# Patient Record
Sex: Female | Born: 1940
Health system: Southern US, Community
[De-identification: ages and names within clinical notes are randomized; demographics above are authoritative.]

## PROBLEM LIST (undated history)

## (undated) ENCOUNTER — Emergency Department (HOSPITAL_COMMUNITY): Admission: EM | Payer: Medicare Other | Source: Home / Self Care

## (undated) DIAGNOSIS — E785 Hyperlipidemia, unspecified: Secondary | ICD-10-CM

## (undated) DIAGNOSIS — E039 Hypothyroidism, unspecified: Secondary | ICD-10-CM

## (undated) DIAGNOSIS — I480 Paroxysmal atrial fibrillation: Secondary | ICD-10-CM

## (undated) DIAGNOSIS — T4145XA Adverse effect of unspecified anesthetic, initial encounter: Secondary | ICD-10-CM

## (undated) DIAGNOSIS — N951 Menopausal and female climacteric states: Secondary | ICD-10-CM

## (undated) DIAGNOSIS — C569 Malignant neoplasm of unspecified ovary: Secondary | ICD-10-CM

## (undated) DIAGNOSIS — I639 Cerebral infarction, unspecified: Secondary | ICD-10-CM

## (undated) DIAGNOSIS — Z85828 Personal history of other malignant neoplasm of skin: Secondary | ICD-10-CM

## (undated) DIAGNOSIS — M1990 Unspecified osteoarthritis, unspecified site: Secondary | ICD-10-CM

## (undated) DIAGNOSIS — T8859XA Other complications of anesthesia, initial encounter: Secondary | ICD-10-CM

## (undated) DIAGNOSIS — I214 Non-ST elevation (NSTEMI) myocardial infarction: Secondary | ICD-10-CM

## (undated) DIAGNOSIS — I10 Essential (primary) hypertension: Secondary | ICD-10-CM

## (undated) HISTORY — PX: TRANSTHORACIC ECHOCARDIOGRAM: SHX275

## (undated) HISTORY — PX: JOINT REPLACEMENT: SHX530

## (undated) HISTORY — DX: Menopausal and female climacteric states: N95.1

## (undated) HISTORY — DX: Malignant neoplasm of unspecified ovary: C56.9

## (undated) HISTORY — DX: Paroxysmal atrial fibrillation: I48.0

---
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
## 1967-11-30 HISTORY — PX: OTHER SURGICAL HISTORY: SHX169

## 1967-11-30 HISTORY — PX: DILATION AND CURETTAGE OF UTERUS: SHX78

## 1984-11-29 HISTORY — PX: OTHER SURGICAL HISTORY: SHX169

## 2005-08-10 ENCOUNTER — Other Ambulatory Visit: Admission: RE | Admit: 2005-08-10 | Discharge: 2005-08-10 | Payer: Self-pay | Admitting: Obstetrics and Gynecology

## 2005-11-29 HISTORY — PX: OTHER SURGICAL HISTORY: SHX169

## 2006-02-22 ENCOUNTER — Ambulatory Visit (HOSPITAL_BASED_OUTPATIENT_CLINIC_OR_DEPARTMENT_OTHER): Admission: RE | Admit: 2006-02-22 | Discharge: 2006-02-22 | Payer: Self-pay | Admitting: Orthopedic Surgery

## 2006-09-09 ENCOUNTER — Ambulatory Visit (HOSPITAL_BASED_OUTPATIENT_CLINIC_OR_DEPARTMENT_OTHER): Admission: RE | Admit: 2006-09-09 | Discharge: 2006-09-09 | Payer: Self-pay | Admitting: Orthopedic Surgery

## 2007-12-08 ENCOUNTER — Encounter: Admission: RE | Admit: 2007-12-08 | Discharge: 2007-12-08 | Payer: Self-pay | Admitting: Internal Medicine

## 2009-01-13 ENCOUNTER — Encounter: Admission: RE | Admit: 2009-01-13 | Discharge: 2009-01-29 | Payer: Self-pay | Admitting: *Deleted

## 2009-04-29 HISTORY — PX: NM MYOVIEW LTD: HXRAD82

## 2009-05-14 ENCOUNTER — Inpatient Hospital Stay (HOSPITAL_COMMUNITY): Admission: EM | Admit: 2009-05-14 | Discharge: 2009-05-16 | Payer: Self-pay | Admitting: Cardiology

## 2009-05-14 ENCOUNTER — Encounter: Payer: Self-pay | Admitting: Emergency Medicine

## 2009-05-15 ENCOUNTER — Encounter (INDEPENDENT_AMBULATORY_CARE_PROVIDER_SITE_OTHER): Payer: Self-pay | Admitting: Cardiology

## 2009-11-29 HISTORY — PX: OTHER SURGICAL HISTORY: SHX169

## 2011-02-08 ENCOUNTER — Other Ambulatory Visit: Payer: Self-pay | Admitting: Otolaryngology

## 2011-02-08 DIAGNOSIS — H93A9 Pulsatile tinnitus, unspecified ear: Secondary | ICD-10-CM

## 2011-02-10 ENCOUNTER — Ambulatory Visit
Admission: RE | Admit: 2011-02-10 | Discharge: 2011-02-10 | Disposition: A | Discharge status: Home / Self Care | Payer: Medicare Other | Source: Ambulatory Visit | Attending: Otolaryngology | Admitting: Otolaryngology

## 2011-02-10 DIAGNOSIS — H93A9 Pulsatile tinnitus, unspecified ear: Secondary | ICD-10-CM

## 2011-02-10 MED ORDER — GADOBENATE DIMEGLUMINE 529 MG/ML IV SOLN
12.0000 mL | Freq: Once | INTRAVENOUS | Status: AC | PRN
  Administered 2011-02-10: 12 mL via INTRAVENOUS

## 2011-03-08 LAB — COMPREHENSIVE METABOLIC PANEL
Alkaline Phosphatase: 32 U/L — ABNORMAL LOW (ref 39–117)
BUN: 4 mg/dL — ABNORMAL LOW (ref 6–23)
Calcium: 7.7 mg/dL — ABNORMAL LOW (ref 8.4–10.5)
Creatinine, Ser: 0.54 mg/dL (ref 0.4–1.2)
Glucose, Bld: 108 mg/dL — ABNORMAL HIGH (ref 70–99)
Potassium: 3.3 mEq/L — ABNORMAL LOW (ref 3.5–5.1)
Total Protein: 5.9 g/dL — ABNORMAL LOW (ref 6.0–8.3)

## 2011-03-08 LAB — BRAIN NATRIURETIC PEPTIDE: Pro B Natriuretic peptide (BNP): 113 pg/mL — ABNORMAL HIGH (ref 0.0–100.0)

## 2011-03-08 LAB — BASIC METABOLIC PANEL
BUN: 4 mg/dL — ABNORMAL LOW (ref 6–23)
BUN: 5 mg/dL — ABNORMAL LOW (ref 6–23)
BUN: 12 mg/dL (ref 6–23)
CO2: 24 mEq/L (ref 19–32)
Calcium: 8.2 mg/dL — ABNORMAL LOW (ref 8.4–10.5)
Calcium: 8.9 mg/dL (ref 8.4–10.5)
Chloride: 101 mEq/L (ref 96–112)
Creatinine, Ser: 0.54 mg/dL (ref 0.4–1.2)
GFR calc non Af Amer: >60 mL/min (ref >60)
GFR calc non Af Amer: >60 mL/min (ref >60)
GFR calc non Af Amer: >60 mL/min (ref >60)
Glucose, Bld: 118 mg/dL — ABNORMAL HIGH (ref 70–99)
Glucose, Bld: 85 mg/dL (ref 70–99)
Glucose, Bld: 118 mg/dL — ABNORMAL HIGH (ref 70–99)
Potassium: 4.3 mEq/L (ref 3.5–5.1)
Sodium: 134 mEq/L — ABNORMAL LOW (ref 135–145)
Sodium: 137 mEq/L (ref 135–145)

## 2011-03-08 LAB — CK TOTAL AND CKMB (NOT AT ARMC)
CK, MB: 1 ng/mL (ref 0.3–4.0)
CK, MB: 1.4 ng/mL (ref 0.3–4.0)
Relative Index: INVALID (ref 0.0–2.5)
Total CK: 27 U/L (ref 7–177)
Total CK: 43 U/L (ref 7–177)

## 2011-03-08 LAB — PROTIME-INR
INR: 1.4 (ref 0.00–1.49)
Prothrombin Time: 17.2 seconds — ABNORMAL HIGH (ref 11.6–15.2)
Prothrombin Time: 15.3 seconds — ABNORMAL HIGH (ref 11.6–15.2)

## 2011-03-08 LAB — DIFFERENTIAL
Basophils Absolute: 0 10*3/uL (ref 0.0–0.1)
Eosinophils Absolute: 0 10*3/uL (ref 0.0–0.7)
Eosinophils Absolute: 0 10*3/uL (ref 0.0–0.7)
Eosinophils Relative: 0 % (ref 0–5)
Lymphs Abs: 0.9 10*3/uL (ref 0.7–4.0)
Lymphs Abs: 1.1 10*3/uL (ref 0.7–4.0)
Neutrophils Relative %: 80 % — ABNORMAL HIGH (ref 43–77)

## 2011-03-08 LAB — CBC
HCT: 26.1 % — ABNORMAL LOW (ref 36.0–46.0)
HCT: 29.6 % — ABNORMAL LOW (ref 36.0–46.0)
Hemoglobin: 10.2 g/dL — ABNORMAL LOW (ref 12.0–15.0)
MCHC: 34.5 g/dL (ref 30.0–36.0)
MCHC: 34.9 g/dL (ref 30.0–36.0)
MCV: 93.1 fL (ref 78.0–100.0)
MCV: 93.8 fL (ref 78.0–100.0)
Platelets: 152 10*3/uL (ref 150–400)
Platelets: 213 10*3/uL (ref 150–400)
Platelets: 219 10*3/uL (ref 150–400)
RBC: 3.16 MIL/uL — ABNORMAL LOW (ref 3.87–5.11)
RDW: 12.5 % (ref 11.5–15.5)
RDW: 12.8 % (ref 11.5–15.5)
RDW: 12.8 % (ref 11.5–15.5)

## 2011-03-08 LAB — IRON AND TIBC
Saturation Ratios: 20 % (ref 20–55)
TIBC: 196 ug/dL — ABNORMAL LOW (ref 250–470)

## 2011-03-08 LAB — T4, FREE
Free T4: 1.3 ng/dL (ref 0.80–1.80)
Free T4: 1.18 ng/dL (ref 0.80–1.80)

## 2011-03-08 LAB — RETICULOCYTES
RBC.: 3.38 MIL/uL — ABNORMAL LOW (ref 3.87–5.11)
Retic Count, Absolute: 50.7 10*3/uL (ref 19.0–186.0)

## 2011-03-08 LAB — CARDIAC PANEL(CRET KIN+CKTOT+MB+TROPI): CK, MB: 0.9 ng/mL (ref 0.3–4.0)

## 2011-03-08 LAB — LIPID PANEL
Triglycerides: 72 mg/dL (ref <150)
VLDL: 14 mg/dL (ref 0–40)

## 2011-03-08 LAB — MAGNESIUM: Magnesium: 2.1 mg/dL (ref 1.5–2.5)

## 2011-03-16 ENCOUNTER — Other Ambulatory Visit: Payer: Self-pay | Admitting: Orthopaedic Surgery

## 2011-03-16 DIAGNOSIS — M25551 Pain in right hip: Secondary | ICD-10-CM

## 2011-03-22 ENCOUNTER — Ambulatory Visit
Admission: RE | Admit: 2011-03-22 | Discharge: 2011-03-22 | Disposition: A | Discharge status: Home / Self Care | Payer: Medicare Other | Source: Ambulatory Visit | Attending: Orthopaedic Surgery | Admitting: Orthopaedic Surgery

## 2011-03-22 DIAGNOSIS — M25551 Pain in right hip: Secondary | ICD-10-CM

## 2011-04-13 NOTE — Discharge Summary (Signed)
NAMEBRIGETT, [name] NO.:  [id number]   MEDICAL RECORD NO.:  [id number]          PATIENT TYPE:  INP   LOCATION:  3738                         FACILITY:  [facility]   PHYSICIAN:  Sheliah Mends, MD      DATE OF BIRTH:  1941/01/29   DATE OF ADMISSION:  05/14/2009  DATE OF DISCHARGE:  05/16/2009                               DISCHARGE SUMMARY   DISCHARGE DIAGNOSES:  1. Paroxysmal atrial fibrillation, the patient is in sinus rhythm on      Multaq at discharge.  2. History of significant arthritis, followed by Dr. Timothy Lasso.  3. Treated hypertension.  4. Treated hypothyroidism.   HOSPITAL COURSE:  The patient is a 70 year old female, who is new to [place].  We are asked to see by the emergency room for atrial fibrillation.  She  presented to the emergency room with tachycardia.  Please see admission  history and physical for complete details.  This was apparently a new  onset with rapid ventricular response.  She has significant arthritis  and has heavy nonsteroidal anti-inflammatory use.  She was started on  heparin and amiodarone.  She actually converted spontaneously after an  amiodarone bolus to sinus.  Initially, she was put on Coumadin but after  discussion with Dr. Timothy Lasso and the patient and reviewing her CHADS 2  score, it was decided to send her home on aspirin alone.  We have also  changed her amiodarone to Multaq 400 mg b.i.d., the patient is very  concerned about the side effects of amiodarone.  At discharge,  echocardiogram showed normal LV function with a small pericardial  effusion and mild MR and TR.  We have no plans for Coumadin at this time  unless she has recurrent atrial fibrillation.   DISCHARGE MEDICATIONS:  1. Advil 800 mg b.i.d.  2. Aspirin 325 mg a day.  3. Bisoprolol 10 mg at bedtime.  4. Estrace 1 mg a day.  5. Glucosamine t.i.d.  6. Hydrochlorothiazide 12.5 mg a day.  7. Omeprazole 20 mg a day.  8. Synthroid 0.088 mg a day.  9. Progesterone 2.5  mg a day.  10.Multaq 400 mg b.i.d.   LABORATORY DATA:  Iron levels 39, TIBC 196, folate 1088, B12 of 959.  Free T4 is 1.3, TSH 5.07.  Cholesterol 112, HDL 14, LDL 184.  Troponins  were negative.  BNP was 113 on admission.  White count 5.3, hemoglobin  10.4, hematocrit 30.3, platelets 213.  INR is 1.4.  Sodium 137,  potassium 4.2, BUN 5, creatinine 0.6.  CT angiogram of the chest showed  bilateral pleural effusions with no pulmonary emboli.  Chest x-ray shows  cardiomegaly with mild vascular congestion on admission.  EKG on the  May 15, 2009, shows sinus rhythm, no acute changes, PACs, and a QTC of  478.   DISPOSITION:  The patient is discharged in stable condition and will  follow up with Dr. Garen Lah as an outpatient in a couple of weeks.      Abelino Derrick, P.A.      Sheliah Mends, MD  Electronically Signed    LKK/MEDQ  D:  05/16/2009  T:  05/17/2009  Job:  [id number]   cc:   Gwen Pounds, MD

## 2011-04-13 NOTE — H&P (Signed)
[name], [name] NO.:  [id number]   MEDICAL RECORD NO.:  [id number]          PATIENT TYPE:  EMS   LOCATION:  ED                           FACILITY:  [facility]   PHYSICIAN:  Devoria Albe, M.D.        DATE OF BIRTH:  [birth year]   DATE OF ADMISSION:  05/13/2009  DATE OF DISCHARGE:                              HISTORY & PHYSICAL   HISTORY OF PRESENT ILLNESS:  [name] is a 70 year old white  married female we are asked to see by a [facility] MD for atrial  flutter with rapid ventricular response.  The patient has no prior  history of coronary artery disease, PAF, CHF, diabetes, tobacco,  dyslipidemia, CVA, or TIA.  She does have a prior history of  hypertension and premature family history of coronary disease in her  maternal grandfather.  She states that she started taking Flexeril last  Wednesday, and she noticed a regular pounding heart beat intermittently.  Apparently last night, the fast irregular pounding would not stop.  She  did have some chest discomfort, however this was in her upper scapular  area, and she states it was mild.  There was no pain associated.  She  had no real shortness of breath, and she has had no symptoms of chest  pain prior.  She has been doing her normal activities without any  problems of dyspnea on exertion, chest pain, fatigue, etc.  In the  emergency room, she was given diltiazem 10 mg.  Blood pressure dropped  to 79/54, heart rate was 110.  She was given a fluid bolus.  Her heart  rate was controlled for while in the 80s to 90s, but it intermittently  goes up to 150s.   LABORATORY DATA:  Hemoglobin 12.1, hematocrit 35.1, WBC 7 and platelets  219.  Sodium 132, potassium 3.7, BUN 12, creatinine 0.72 and glucose  118, CK-MB and troponin were negative times one.  Chest x-ray showed  cardiomegaly with mild vascular congestion.   PRIOR MEDICAL HISTORY:  1. Hypertension for 10-12 years.  2. Hypothyroidism 7-8 years.  3.  Osteoarthritis all over.  4. She has a bone spur of left knee with a recent injection and she      was started on Flexeril.   SURGERIES:  1. Carpal tunnel.  2. Ruptured ovarian cyst repair.  3. Tonsillectomy.   MEDICATIONS:  1. Estrogen replacement therapy  2. High doses NSAIDS.  3. Flexeril 10 mg a day.  4. __________100 mg b.i.d.  5. Aspirin 81 mg every day.  6. Bisoprolol 10 mg at bedtime.  7. Estrace 1 mg every day.  8. Glucosamine three times a day.  9. Hydralazine 100 mg t.i.d.  10.Hydrochlorothiazide 25 mg every day.  11.__________2.5 mg a day.  12.Omeprazole 20 mg every day.  13.Synthroid 88 mcg a day.   ALLERGIES:  NO KNOWN DRUG ALLERGIES.  SHE IS INTOLERANT TO ACE  INHIBITORS.  SHE STATES HER HEAD FEELS HEAVY.   FAMILY HISTORY:  Her maternal grandfather had an MI and died at age 68.  His first MI was  in his 57s.  Mother is alive.  She has had a TIA.  Father died of an abdominal aortic aneurysm rupture.  She has one  brother and one sister in good health.  She is the oldest.   SOCIAL HISTORY:  She works as an Charity fundraiser at [employer], [employer]  She  is planning to return in August.  She walks 30 minutes most days.  She  has three children, four and a half grandchildren.  She is married.  No  tobacco or alcohol.   REVIEW OF SYSTEMS:  She has no presyncope, no syncope, no congestion, no  fever or chills.  __________GERD.  Positive tachy palpitations, plus or  minus some chest discomfort.  No lower extremity edema.  No dysuria.  No  black stools, no swelling.  Other symptoms are negative.   PHYSICAL EXAMINATION:  VITAL SIGNS:  Blood pressure is 114/67, heart  rate 118-154, 80-90s, respirations are 18, temperature is 98.7.  GENERAL:  No acute distress, laying on the stretcher with oxygen on.  SKIN:  Warm and dry.  HEENT:  She wears glasses.  Pupils equal, round, react to light.  NECK:  Supple.  No thyromegaly, no adenopathy and no carotid bruits,  respirations are  clear to auscultation bilaterally.  CARDIOVASCULAR:  Heart sounds irregular regular.  No murmur.  GI:  Abdomen soft, protuberant, bowel sounds present x4.  No mass.  MUSCULOSKELETAL:  Moves all extremities x4.  NEUROLOGICAL:  Alert and oriented x3.  No focal deficits.  LOWER EXTREMITIES:  Show no edema.   ASSESSMENT:  1. Look for proximal atrial flutter and fib with a rapid ventricular      response, maybe up to 5 days durations  2. Hypertension.  3. Hypothyroidism.  4. Mild vascular congestion on x-ray.  5. Osteoarthritis which is significant.  She is on high dose NSAIDS.      She states she does have a follow up to see rheumatologist.  6. History of replacement therapy.   PLAN:  We will admit her, anticoagulate, control rate, get 2-D echo.  She is at high risk for bleed on anticoagulation because of her NSAID.  She states she cannot do without them.  She will eventually need an  outpatient stress test.  Do know the patient needs a cath at this time.      Lezlie Octave, N.P.    ______________________________  Devoria Albe, M.D.    BB/MEDQ  D:  05/14/2009  T:  05/14/2009  Job:  [id number]   cc:   Gwen Pounds, MD  Vanita Panda. Magnus Ivan, M.D.

## 2011-04-16 NOTE — Op Note (Signed)
[name], BERMAN             ACCOUNT NO.:  [id number]   MEDICAL RECORD NO.:  [id number]          PATIENT TYPE:  AMB   LOCATION:  DSC                          FACILITY:  [facility]   PHYSICIAN:  Katy Fitch. Sypher, M.D. 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   OPERATION:  Release of right transverse carpal ligament.   OPERATIONS:  Katy Fitch. Sypher, M.D.   ASSISTANT:  Molly Maduro Dasnoit PA-C.   ANESTHESIA:  General by LMA.   SUPERVISING ANESTHESIOLOGIST:  Janetta Hora. Gelene Mink, M.D.   INDICATIONS:  [name] is a 70 year old right-hand dominant woman  referred by Dr. Creola Corn for evaluation and management of a painful and  numb right hand.  Clinical examination revealed signs of entrapped  neuropathy.  Electrodiagnostic studies confirmed significant right median  neuropathy.  After informed consent, she is brought to the operating room at  this time for release of her right transverse carpal ligament.   DESCRIPTION OF PROCEDURE:  [name] is brought to the operating room  and placed in supine position on the operating table.  Following the  induction of general anesthesia by LMA technique, the right arm was prepped  with Betadine soap solution and sterilely draped.  A pneumatic tourniquet  was applied to the proximal right brachium.  Following exsanguination of the  right arm with an Esmarch bandage, the arterial tourniquet was inflated to  220 mmHg.  The procedure commenced with a short incision in the line of the  ring finger in the palm.  The superficial palmar arch and the common sensory  branch of the median nerve were identified and followed back to the  transverse carpal ligament.  The ligament was  gently isolated from the  median nerve, followed by release of the ligament along its ulnar border  extending into the distal forearm.  This widely opened the carpal canal.  No  masses or other predicaments were noted.  Bleeding points along the margin  of the released ligament were electrocauterized with bipolar current  followed by repair of the skin with intradermal 3-0 Prolene suture.  A  compressive dressing was applied with a volar plaster splint maintaining the  wrist in 5 degrees of dorsiflexion.   For aftercare, [name] is provided with a prescription for Vicodin 5  mg 1 tablet p.o. q.4-6h. p.r.n. pain, 20 tablets without refill.      Katy Fitch Sypher, M.D.  Electronically Signed     RVS/MEDQ  D:  02/22/2006  T:  02/23/2006  Job:  [id number]   cc:   Gwen Pounds, MD  Fax: [phone]

## 2011-04-16 NOTE — Op Note (Signed)
[name], [name]             ACCOUNT NO.:  [id number]   MEDICAL RECORD NO.:  [id number]          PATIENT TYPE:  AMB   LOCATION:  DSC                          FACILITY:  [facility]   PHYSICIAN:  Katy Fitch. Sypher, M.D. DATE OF BIRTH:  [birth year]   DATE OF PROCEDURE:  09/09/2006  DATE OF DISCHARGE:                                 OPERATIVE REPORT   PREOPERATIVE DIAGNOSIS:  Chronic entrapment neuropathy, left median nerve  and carpal tunnel.   POSTOPERATIVE DIAGNOSIS:  Chronic entrapment neuropathy, left median nerve  and carpal tunnel.   OPERATION:  Release of the transverse carpal ligament.   OPERATIVE SURGEON:  Katy Fitch. Sypher, MD.   ASSISTANT:  Annye Rusk, PA-C.   ANESTHESIA:  General by LMA supervised by the anesthesiologist, Dr. Krista Blue.   INDICATIONS:  [name] is a 70 year old, right-hand-dominant woman,  who has had a history of bilateral carpal tunnel syndrome.   She is status post release of her right transverse carpal ligament in early  2007 with excellent results.  She now returns for left side surgery.  Preoperatively, she was reminded of potential risks and benefits of the  surgery.  She understands the aftercare from her prior experience.   After informed consent, [name] was brought to the operating room at  this time.   PROCEDURE:  [name] is brought to the operating room and placed in  supine position on the operating table.   Following the induction of general anesthesia by LMA technique, the left arm  was prepped with Betadine soap and solution and sterilely draped.  A  pneumatic tourniquet was applied to the proximal left brachium.   Following exsanguination of the left arm with an Esmarch bandage, the  tourniquet was inflated to 250 mmHg due to mild systolic hypertension.   The procedure commenced with a short incision in the line of the ring finger  in the palm.  Subcutaneous tissues were carefully divided taking care to  identify the palmar fascia.  The fascia was split longitudinally to reveal  the common sensory branches of the median nerve.   The common sensory branches were followed back to the median nerve proper.  A Penfield-4 elevator was used to clear a path superficial to the median  nerve and deep to the transverse carpal ligament.  The transverse carpal  ligament was then released subcutaneously with scissors extending into the  distal forearm.   This widened the open carpal canal.   No masses or __________were noted.   Bleeding points along the margin of the released ligament were  electrocauterized with bipolar current.   The wound was then repaired with intradermal #3-0 Prolene suture.   A compressive dressing was applied with a volar compressive splint to  maintain the wrist in five degrees of dorsiflexion.   [name] was awakened from her general anesthesia and transferred to  the recovery room with stable vital signs.   She will be discharged to the care of her husband with a prescription for  Percocet, which she already has at home.  She will also use over-the-counter  analgesics.  Katy Fitch Sypher, M.D.  Electronically Signed     RVS/MEDQ  D:  09/09/2006  T:  09/11/2006  Job:  [id number]   cc:   Gwen Pounds, MD

## 2012-02-16 ENCOUNTER — Encounter (HOSPITAL_COMMUNITY): Payer: Self-pay | Admitting: Pharmacy Technician

## 2012-02-17 ENCOUNTER — Other Ambulatory Visit (HOSPITAL_COMMUNITY): Payer: Self-pay | Admitting: Orthopaedic Surgery

## 2012-02-21 ENCOUNTER — Encounter (HOSPITAL_COMMUNITY)
Admission: RE | Admit: 2012-02-21 | Discharge: 2012-02-21 | Disposition: A | Discharge status: Home / Self Care | Payer: Medicare Other | Source: Ambulatory Visit | Attending: Orthopaedic Surgery | Admitting: Orthopaedic Surgery

## 2012-02-21 ENCOUNTER — Other Ambulatory Visit (HOSPITAL_COMMUNITY): Payer: Medicare Other

## 2012-02-21 ENCOUNTER — Ambulatory Visit (HOSPITAL_COMMUNITY)
Admission: RE | Admit: 2012-02-21 | Discharge: 2012-02-21 | Disposition: A | Discharge status: Home / Self Care | Payer: Medicare Other | Source: Ambulatory Visit | Attending: Orthopaedic Surgery | Admitting: Orthopaedic Surgery

## 2012-02-21 ENCOUNTER — Encounter (HOSPITAL_COMMUNITY): Payer: Self-pay

## 2012-02-21 DIAGNOSIS — Z01818 Encounter for other preprocedural examination: Secondary | ICD-10-CM | POA: Insufficient documentation

## 2012-02-21 DIAGNOSIS — Z01812 Encounter for preprocedural laboratory examination: Secondary | ICD-10-CM | POA: Insufficient documentation

## 2012-02-21 HISTORY — DX: Hyperlipidemia, unspecified: E78.5

## 2012-02-21 HISTORY — DX: Other complications of anesthesia, initial encounter: T88.59XA

## 2012-02-21 HISTORY — DX: Adverse effect of unspecified anesthetic, initial encounter: T41.45XA

## 2012-02-21 HISTORY — DX: Hypothyroidism, unspecified: E03.9

## 2012-02-21 HISTORY — DX: Essential (primary) hypertension: I10

## 2012-02-21 HISTORY — DX: Unspecified osteoarthritis, unspecified site: M19.90

## 2012-02-21 LAB — URINALYSIS, ROUTINE W REFLEX MICROSCOPIC
Glucose, UA: NEGATIVE mg/dL
Hgb urine dipstick: NEGATIVE
Specific Gravity, Urine: 1.013 (ref 1.005–1.030)
pH: 7 (ref 5.0–8.0)

## 2012-02-21 LAB — CBC
HCT: 44 % (ref 36.0–46.0)
Hemoglobin: 14.8 g/dL (ref 12.0–15.0)
WBC: 6.7 10*3/uL (ref 4.0–10.5)

## 2012-02-21 LAB — APTT: aPTT: 39 seconds — ABNORMAL HIGH (ref 24–37)

## 2012-02-21 LAB — SURGICAL PCR SCREEN: Staphylococcus aureus: NEGATIVE

## 2012-02-21 LAB — BASIC METABOLIC PANEL
Chloride: 99 mEq/L (ref 96–112)
GFR calc Af Amer: >90 mL/min (ref >90)
Potassium: 3.5 mEq/L (ref 3.5–5.1)

## 2012-02-21 LAB — PROTIME-INR: Prothrombin Time: 13.6 seconds (ref 11.6–15.2)

## 2012-02-21 NOTE — Patient Instructions (Addendum)
YOUR SURGERY IS SCHEDULED ON: Friday  3/29  AT 7:30 AM  REPORT TO [facility] AT:  5:30 AM      PHONE # FOR SHORT STAY IS [phone]  DO NOT EAT OR DRINK ANYTHING AFTER MIDNIGHT THE NIGHT BEFORE YOUR SURGERY.  YOU MAY BRUSH YOUR TEETH, RINSE OUT YOUR MOUTH--BUT NO WATER, NO FOOD, NO CHEWING GUM, NO MINTS, NO CANDIES, NO CHEWING TOBACCO.  PLEASE TAKE THE FOLLOWING MEDICATIONS THE AM OF YOUR SURGERY WITH A FEW SIPS OF WATER:  BISOPROLOL, ESTRADIOL, HYDRALAZINE, PROPAFENONE ( RYTHMOL ), SYNTHROID AND NORETHINDRONE    IF YOU USE INHALERS--USE YOUR INHALERS THE AM OF YOUR SURGERY AND BRING INHALERS TO THE HOSPITAL -TAKE TO SURGERY.    IF YOU ARE DIABETIC:  DO NOT TAKE ANY DIABETIC MEDICATIONS THE AM OF YOUR SURGERY.  IF YOU TAKE INSULIN IN THE EVENINGS--PLEASE ONLY TAKE 1/2 NORMAL EVENING DOSE THE NIGHT BEFORE YOUR SURGERY.  NO INSULIN THE AM OF YOUR SURGERY.  IF YOU HAVE SLEEP APNEA AND USE CPAP OR BIPAP--PLEASE BRING THE MASK --NOT THE MACHINE-NOT THE TUBING   -JUST THE MASK. DO NOT BRING VALUABLES, MONEY, CREDIT CARDS.  CONTACT LENS, DENTURES / PARTIALS, GLASSES SHOULD NOT BE WORN TO SURGERY AND IN MOST CASES-HEARING AIDS WILL NEED TO BE REMOVED.  BRING YOUR GLASSES CASE, ANY EQUIPMENT NEEDED FOR YOUR CONTACT LENS. FOR PATIENTS ADMITTED TO THE HOSPITAL--CHECK OUT TIME THE DAY OF DISCHARGE IS 11:00 AM.  ALL INPATIENT ROOMS ARE PRIVATE - WITH BATHROOM, TELEPHONE, TELEVISION AND WIFI INTERNET. IF YOU ARE BEING DISCHARGED THE SAME DAY OF YOUR SURGERY--YOU CAN NOT DRIVE YOURSELF HOME--AND SHOULD NOT GO HOME ALONE BY TAXI OR BUS.  NO DRIVING OR OPERATING MACHINERY FOR 24 HOURS FOLLOWING ANESTHESIA / PAIN MEDICATIONS.                            SPECIAL INSTRUCTIONS:  CHLORHEXIDINE SOAP SHOWER (other brand names are Betasept and Hibiclens ) PLEASE SHOWER WITH CHLORHEXIDINE THE NIGHT BEFORE YOUR SURGERY AND THE AM OF YOUR SURGERY. DO NOT USE CHLORHEXIDINE ON YOUR FACE OR PRIVATE  AREAS--YOU MAY USE YOUR NORMAL SOAP THOSE AREAS AND YOUR NORMAL SHAMPOO.  WOMEN SHOULD AVOID SHAVING UNDER ARMS AND SHAVING LEGS 48 HOURS BEFORE USING CHLORHEXIDINE TO AVOID SKIN IRRITATION.  DO NOT USE IF ALLERGIC TO CHLORHEXIDINE.  PLEASE READ OVER ANY  FACT SHEETS THAT YOU WERE GIVEN: MRSA INFORMATION, BLOOD TRANSFUSION

## 2012-02-21 NOTE — Pre-Procedure Instructions (Signed)
PT HAS EKG AND CARDIOLOGY OFFICE NOTE ON HER CHART - FROM DR. HARDING WITH [facility]--GIVING CARDIAC CLEARANCE FOR HER HIP REPLACEMENT SURGERY. CBC, BMET, PT, PTT, UA, AND CXR WERE DONE TODAY PREOP.

## 2012-02-25 ENCOUNTER — Encounter (HOSPITAL_COMMUNITY)
Admission: RE | Disposition: A | Discharge status: Home Health | Payer: Self-pay | Source: Ambulatory Visit | Attending: Orthopaedic Surgery

## 2012-02-25 ENCOUNTER — Inpatient Hospital Stay (HOSPITAL_COMMUNITY)
Admission: RE | Admit: 2012-02-25 | Discharge: 2012-02-28 | DRG: 470 | Disposition: A | Discharge status: Home Health | Payer: Medicare Other | Source: Ambulatory Visit | Attending: Orthopaedic Surgery | Admitting: Orthopaedic Surgery

## 2012-02-25 ENCOUNTER — Encounter (HOSPITAL_COMMUNITY): Payer: Self-pay | Admitting: Orthopedic Surgery

## 2012-02-25 ENCOUNTER — Inpatient Hospital Stay (HOSPITAL_COMMUNITY): Payer: Medicare Other

## 2012-02-25 ENCOUNTER — Encounter (HOSPITAL_COMMUNITY): Payer: Self-pay | Admitting: Anesthesiology

## 2012-02-25 ENCOUNTER — Inpatient Hospital Stay (HOSPITAL_COMMUNITY): Payer: Medicare Other | Admitting: Anesthesiology

## 2012-02-25 DIAGNOSIS — M169 Osteoarthritis of hip, unspecified: Secondary | ICD-10-CM

## 2012-02-25 DIAGNOSIS — I10 Essential (primary) hypertension: Secondary | ICD-10-CM | POA: Diagnosis present

## 2012-02-25 DIAGNOSIS — E039 Hypothyroidism, unspecified: Secondary | ICD-10-CM | POA: Diagnosis present

## 2012-02-25 DIAGNOSIS — M1610 Unilateral primary osteoarthritis, unspecified hip: Principal | ICD-10-CM | POA: Diagnosis present

## 2012-02-25 DIAGNOSIS — E785 Hyperlipidemia, unspecified: Secondary | ICD-10-CM | POA: Diagnosis present

## 2012-02-25 HISTORY — PX: TOTAL HIP ARTHROPLASTY: SHX124

## 2012-02-25 LAB — TYPE AND SCREEN: ABO/RH(D): A POS

## 2012-02-25 SURGERY — ARTHROPLASTY, HIP, TOTAL, ANTERIOR APPROACH
Anesthesia: General | Site: Hip | Laterality: Right | Wound class: Clean

## 2012-02-25 MED ORDER — HYDROMORPHONE HCL PF 1 MG/ML IJ SOLN
0.2500 mg | INTRAMUSCULAR | Status: DC | PRN
  Administered 2012-02-25 (×2): 0.5 mg via INTRAVENOUS

## 2012-02-25 MED ORDER — LACTATED RINGERS IV SOLN
INTRAVENOUS | Status: DC | PRN
  Administered 2012-02-25: 07:00:00 via INTRAVENOUS

## 2012-02-25 MED ORDER — NORETHINDRONE ACETATE 5 MG PO TABS
2.5000 mg | ORAL_TABLET | Freq: Every day | ORAL | Status: DC
  Filled 2012-02-25 (×2): qty 1

## 2012-02-25 MED ORDER — ONDANSETRON HCL 4 MG/2ML IJ SOLN
INTRAMUSCULAR | Status: DC | PRN
  Administered 2012-02-25: 4 mg via INTRAVENOUS

## 2012-02-25 MED ORDER — ACETAMINOPHEN 650 MG RE SUPP: 650.0000 mg | Freq: Four times a day (QID) | RECTAL | Status: DC | PRN

## 2012-02-25 MED ORDER — DIPHENHYDRAMINE HCL 50 MG/ML IJ SOLN: 12.5000 mg | Freq: Four times a day (QID) | INTRAMUSCULAR | Status: DC | PRN

## 2012-02-25 MED ORDER — ACETAMINOPHEN 10 MG/ML IV SOLN
INTRAVENOUS | Status: DC | PRN
  Administered 2012-02-25: 1000 mg via INTRAVENOUS

## 2012-02-25 MED ORDER — METOCLOPRAMIDE HCL 5 MG/ML IJ SOLN: 5.0000 mg | Freq: Three times a day (TID) | INTRAMUSCULAR | Status: DC | PRN

## 2012-02-25 MED ORDER — PROPOFOL 10 MG/ML IV EMUL
INTRAVENOUS | Status: DC | PRN
  Administered 2012-02-25: 150 mg via INTRAVENOUS

## 2012-02-25 MED ORDER — PROPAFENONE HCL 225 MG PO TABS
225.0000 mg | ORAL_TABLET | Freq: Two times a day (BID) | ORAL | Status: DC
  Administered 2012-02-25 – 2012-02-28 (×6): 225 mg via ORAL
  Filled 2012-02-25 (×9): qty 1

## 2012-02-25 MED ORDER — HYDROCHLOROTHIAZIDE 12.5 MG PO CAPS
12.5000 mg | ORAL_CAPSULE | ORAL | Status: DC
  Filled 2012-02-25 (×2): qty 1

## 2012-02-25 MED ORDER — GLYCOPYRROLATE 0.2 MG/ML IJ SOLN
INTRAMUSCULAR | Status: DC | PRN
  Administered 2012-02-25: .5 mg via INTRAVENOUS

## 2012-02-25 MED ORDER — 0.9 % SODIUM CHLORIDE (POUR BTL) OPTIME
TOPICAL | Status: DC | PRN
  Administered 2012-02-25: 1000 mL

## 2012-02-25 MED ORDER — CEFAZOLIN SODIUM 1-5 GM-% IV SOLN
1.0000 g | INTRAVENOUS | Status: AC
  Administered 2012-02-25: 1 g via INTRAVENOUS

## 2012-02-25 MED ORDER — LEVOTHYROXINE SODIUM 88 MCG PO TABS
88.0000 ug | ORAL_TABLET | Freq: Every day | ORAL | Status: DC
  Filled 2012-02-25: qty 1

## 2012-02-25 MED ORDER — ALUM & MAG HYDROXIDE-SIMETH 200-200-20 MG/5ML PO SUSP
30.0000 mL | ORAL | Status: DC | PRN
  Administered 2012-02-25 – 2012-02-27 (×4): 30 mL via ORAL
  Filled 2012-02-25 (×4): qty 30

## 2012-02-25 MED ORDER — FERROUS SULFATE 325 (65 FE) MG PO TABS
325.0000 mg | ORAL_TABLET | Freq: Three times a day (TID) | ORAL | Status: DC
  Administered 2012-02-25 – 2012-02-28 (×9): 325 mg via ORAL
  Filled 2012-02-25 (×9): qty 1

## 2012-02-25 MED ORDER — POTASSIUM CHLORIDE CRYS ER 10 MEQ PO TBCR
10.0000 meq | EXTENDED_RELEASE_TABLET | Freq: Two times a day (BID) | ORAL | Status: DC
  Administered 2012-02-25 – 2012-02-28 (×7): 10 meq via ORAL
  Filled 2012-02-25 (×8): qty 1

## 2012-02-25 MED ORDER — BLISTEX EX OINT
TOPICAL_OINTMENT | CUTANEOUS | Status: AC
  Filled 2012-02-25: qty 10

## 2012-02-25 MED ORDER — ACETAMINOPHEN 325 MG PO TABS: 650.0000 mg | ORAL_TABLET | Freq: Four times a day (QID) | ORAL | Status: DC | PRN

## 2012-02-25 MED ORDER — MORPHINE SULFATE (PF) 1 MG/ML IV SOLN
INTRAVENOUS | Status: DC
  Administered 2012-02-25: 10:00:00 via INTRAVENOUS
  Administered 2012-02-26: 2 mg via INTRAVENOUS
  Administered 2012-02-26: 3 mg via INTRAVENOUS

## 2012-02-25 MED ORDER — OXYCODONE-ACETAMINOPHEN 5-325 MG PO TABS: 1.0000 | ORAL_TABLET | ORAL | Status: DC | PRN

## 2012-02-25 MED ORDER — LIDOCAINE HCL (CARDIAC) 20 MG/ML IV SOLN
INTRAVENOUS | Status: DC | PRN
  Administered 2012-02-25: 70 mg via INTRAVENOUS

## 2012-02-25 MED ORDER — FENTANYL CITRATE 0.05 MG/ML IJ SOLN
INTRAMUSCULAR | Status: DC | PRN
  Administered 2012-02-25 (×3): 50 ug via INTRAVENOUS

## 2012-02-25 MED ORDER — CEFAZOLIN SODIUM 1-5 GM-% IV SOLN
1.0000 g | Freq: Four times a day (QID) | INTRAVENOUS | Status: AC
  Administered 2012-02-25 – 2012-02-26 (×3): 1 g via INTRAVENOUS
  Filled 2012-02-25 (×3): qty 50

## 2012-02-25 MED ORDER — HYDROCODONE-ACETAMINOPHEN 5-325 MG PO TABS
1.0000 | ORAL_TABLET | ORAL | Status: DC | PRN
  Administered 2012-02-26 – 2012-02-28 (×7): 1 via ORAL
  Filled 2012-02-25: qty 2
  Filled 2012-02-25 (×4): qty 1
  Filled 2012-02-25: qty 2
  Filled 2012-02-25: qty 1

## 2012-02-25 MED ORDER — RIVAROXABAN 10 MG PO TABS
10.0000 mg | ORAL_TABLET | Freq: Every day | ORAL | Status: DC
  Administered 2012-02-26 – 2012-02-28 (×3): 10 mg via ORAL
  Filled 2012-02-25 (×4): qty 1

## 2012-02-25 MED ORDER — LOSARTAN POTASSIUM 50 MG PO TABS
50.0000 mg | ORAL_TABLET | Freq: Every day | ORAL | Status: DC
  Administered 2012-02-25 – 2012-02-28 (×3): 50 mg via ORAL
  Filled 2012-02-25 (×4): qty 1

## 2012-02-25 MED ORDER — EPHEDRINE SULFATE 50 MG/ML IJ SOLN
INTRAMUSCULAR | Status: DC | PRN
  Administered 2012-02-25: 10 mg via INTRAVENOUS
  Administered 2012-02-25 (×3): 5 mg via INTRAVENOUS

## 2012-02-25 MED ORDER — HYDROCHLOROTHIAZIDE 12.5 MG PO CAPS
12.5000 mg | ORAL_CAPSULE | Freq: Every day | ORAL | Status: DC
  Administered 2012-02-26 – 2012-02-28 (×3): 12.5 mg via ORAL
  Filled 2012-02-25 (×4): qty 1

## 2012-02-25 MED ORDER — DOCUSATE SODIUM 100 MG PO CAPS
100.0000 mg | ORAL_CAPSULE | Freq: Two times a day (BID) | ORAL | Status: DC
  Administered 2012-02-25 – 2012-02-28 (×7): 100 mg via ORAL
  Filled 2012-02-25 (×6): qty 1

## 2012-02-25 MED ORDER — METHOCARBAMOL 500 MG PO TABS
500.0000 mg | ORAL_TABLET | Freq: Four times a day (QID) | ORAL | Status: DC | PRN
  Administered 2012-02-25 – 2012-02-28 (×10): 500 mg via ORAL
  Filled 2012-02-25 (×9): qty 1

## 2012-02-25 MED ORDER — SODIUM CHLORIDE 0.9 % IJ SOLN: 9.0000 mL | INTRAMUSCULAR | Status: DC | PRN

## 2012-02-25 MED ORDER — METOCLOPRAMIDE HCL 10 MG PO TABS
5.0000 mg | ORAL_TABLET | Freq: Three times a day (TID) | ORAL | Status: DC | PRN
  Administered 2012-02-25 – 2012-02-26 (×2): 10 mg via ORAL
  Filled 2012-02-25 (×2): qty 1

## 2012-02-25 MED ORDER — DIPHENHYDRAMINE HCL 12.5 MG/5ML PO ELIX
12.5000 mg | ORAL_SOLUTION | Freq: Four times a day (QID) | ORAL | Status: DC | PRN
  Filled 2012-02-25: qty 5

## 2012-02-25 MED ORDER — ONDANSETRON HCL 4 MG/2ML IJ SOLN: 4.0000 mg | Freq: Four times a day (QID) | INTRAMUSCULAR | Status: DC | PRN

## 2012-02-25 MED ORDER — CLOPIDOGREL BISULFATE 75 MG PO TABS
75.0000 mg | ORAL_TABLET | Freq: Every day | ORAL | Status: DC
  Administered 2012-02-26 – 2012-02-27 (×2): 75 mg via ORAL
  Filled 2012-02-25 (×3): qty 1

## 2012-02-25 MED ORDER — MENTHOL 3 MG MT LOZG
1.0000 | LOZENGE | OROMUCOSAL | Status: DC | PRN
  Filled 2012-02-25: qty 9

## 2012-02-25 MED ORDER — NEOSTIGMINE METHYLSULFATE 1 MG/ML IJ SOLN
INTRAMUSCULAR | Status: DC | PRN
  Administered 2012-02-25: 3.5 mg via INTRAVENOUS

## 2012-02-25 MED ORDER — LACTATED RINGERS IV SOLN: INTRAVENOUS | Status: DC

## 2012-02-25 MED ORDER — MIDAZOLAM HCL 5 MG/5ML IJ SOLN
INTRAMUSCULAR | Status: DC | PRN
  Administered 2012-02-25: 2 mg via INTRAVENOUS

## 2012-02-25 MED ORDER — NALOXONE HCL 0.4 MG/ML IJ SOLN: 0.4000 mg | INTRAMUSCULAR | Status: DC | PRN

## 2012-02-25 MED ORDER — DIPHENHYDRAMINE HCL 12.5 MG/5ML PO ELIX: 12.5000 mg | ORAL_SOLUTION | ORAL | Status: DC | PRN

## 2012-02-25 MED ORDER — MORPHINE SULFATE 2 MG/ML IJ SOLN: 1.0000 mg | INTRAMUSCULAR | Status: DC | PRN

## 2012-02-25 MED ORDER — ROCURONIUM BROMIDE 100 MG/10ML IV SOLN
INTRAVENOUS | Status: DC | PRN
  Administered 2012-02-25: 40 mg via INTRAVENOUS

## 2012-02-25 MED ORDER — METHOCARBAMOL 100 MG/ML IJ SOLN
500.0000 mg | Freq: Four times a day (QID) | INTRAVENOUS | Status: DC | PRN
  Administered 2012-02-25 (×2): 500 mg via INTRAVENOUS
  Filled 2012-02-25 (×2): qty 5

## 2012-02-25 MED ORDER — SODIUM CHLORIDE 0.9 % IV SOLN
INTRAVENOUS | Status: DC
  Administered 2012-02-25: 75 mL/h via INTRAVENOUS
  Administered 2012-02-26: 02:00:00 via INTRAVENOUS

## 2012-02-25 MED ORDER — BISOPROLOL FUMARATE 5 MG PO TABS
5.0000 mg | ORAL_TABLET | Freq: Two times a day (BID) | ORAL | Status: DC
  Administered 2012-02-25 – 2012-02-28 (×6): 5 mg via ORAL
  Filled 2012-02-25 (×8): qty 1

## 2012-02-25 MED ORDER — PHENOL 1.4 % MT LIQD
1.0000 | OROMUCOSAL | Status: DC | PRN
  Filled 2012-02-25: qty 177

## 2012-02-25 MED ORDER — ONDANSETRON HCL 4 MG PO TABS: 4.0000 mg | ORAL_TABLET | Freq: Four times a day (QID) | ORAL | Status: DC | PRN

## 2012-02-25 MED ORDER — HYDRALAZINE HCL 25 MG PO TABS
25.0000 mg | ORAL_TABLET | Freq: Two times a day (BID) | ORAL | Status: DC
  Administered 2012-02-25 – 2012-02-28 (×6): 25 mg via ORAL
  Filled 2012-02-25 (×8): qty 1

## 2012-02-25 MED ORDER — ESTRADIOL 1 MG PO TABS
1.0000 mg | ORAL_TABLET | Freq: Every day | ORAL | Status: DC
  Filled 2012-02-25 (×2): qty 1

## 2012-02-25 MED ORDER — ADULT MULTIVITAMIN W/MINERALS CH
1.0000 | ORAL_TABLET | Freq: Every day | ORAL | Status: DC
  Administered 2012-02-25 – 2012-02-28 (×4): 1 via ORAL
  Filled 2012-02-25 (×4): qty 1

## 2012-02-25 MED ORDER — ZOLPIDEM TARTRATE 5 MG PO TABS: 5.0000 mg | ORAL_TABLET | Freq: Every evening | ORAL | Status: DC | PRN

## 2012-02-25 SURGICAL SUPPLY — 30 items
BAG ZIPLOCK 12X15 (MISCELLANEOUS) ×2 IMPLANT
BLADE SAW SGTL 18X1.27X75 (BLADE) ×2 IMPLANT
CELLS DAT CNTRL 66122 CELL SVR (MISCELLANEOUS) ×1 IMPLANT
CLOTH BEACON ORANGE TIMEOUT ST (SAFETY) ×2 IMPLANT
DRAPE C-ARM 42X72 X-RAY (DRAPES) ×2 IMPLANT
DRAPE STERI IOBAN 125X83 (DRAPES) ×2 IMPLANT
DRAPE U-SHAPE 47X51 STRL (DRAPES) ×6 IMPLANT
DRSG MEPILEX BORDER 4X8 (GAUZE/BANDAGES/DRESSINGS) ×2 IMPLANT
DURAPREP 26ML APPLICATOR (WOUND CARE) ×2 IMPLANT
ELECT BLADE TIP CTD 4 INCH (ELECTRODE) ×2 IMPLANT
ELECT REM PT RETURN 9FT ADLT (ELECTROSURGICAL) ×2
ELECTRODE REM PT RTRN 9FT ADLT (ELECTROSURGICAL) ×1 IMPLANT
FACESHIELD LNG OPTICON STERILE (SAFETY) ×6 IMPLANT
GAUZE XEROFORM 1X8 LF (GAUZE/BANDAGES/DRESSINGS) ×2 IMPLANT
GLOVE BIO SURGEON STRL SZ7.5 (GLOVE) ×2 IMPLANT
GLOVE BIOGEL PI IND STRL 8 (GLOVE) ×1 IMPLANT
GLOVE BIOGEL PI INDICATOR 8 (GLOVE) ×1
GOWN STRL REIN XL XLG (GOWN DISPOSABLE) ×4 IMPLANT
KIT BASIN OR (CUSTOM PROCEDURE TRAY) ×2 IMPLANT
PACK TOTAL JOINT (CUSTOM PROCEDURE TRAY) ×2 IMPLANT
PADDING CAST COTTON 6X4 STRL (CAST SUPPLIES) ×2 IMPLANT
RTRCTR WOUND ALEXIS 18CM MED (MISCELLANEOUS) ×2
STAPLER SKIN PROX WIDE 3.9 (STAPLE) ×2 IMPLANT
SUT ETHIBOND NAB CT1 #1 30IN (SUTURE) ×2 IMPLANT
SUT VIC AB 1 CT1 36 (SUTURE) ×2 IMPLANT
SUT VIC AB 2-0 CT1 27 (SUTURE) ×1
SUT VIC AB 2-0 CT1 TAPERPNT 27 (SUTURE) ×1 IMPLANT
TOWEL OR 17X26 10 PK STRL BLUE (TOWEL DISPOSABLE) ×4 IMPLANT
TOWEL OR NON WOVEN STRL DISP B (DISPOSABLE) ×2 IMPLANT
TRAY FOLEY CATH 14FRSI W/METER (CATHETERS) ×2 IMPLANT

## 2012-02-25 NOTE — Anesthesia Postprocedure Evaluation (Signed)
  Anesthesia Post-op Note  Patient: [name]  Procedure(s) Performed: Procedure(s) (LRB): TOTAL HIP ARTHROPLASTY ANTERIOR APPROACH (Right)  Patient Location: PACU  Anesthesia Type: General  Level of Consciousness: oriented and sedated  Airway and Oxygen Therapy: Patient Spontanous Breathing and Patient connected to nasal cannula oxygen  Post-op Pain: mild  Post-op Assessment: Post-op Vital signs reviewed, Patient's Cardiovascular Status Stable, Respiratory Function Stable and Patent Airway  Post-op Vital Signs: stable  Complications: No apparent anesthesia complications

## 2012-02-25 NOTE — Anesthesia Preprocedure Evaluation (Addendum)
Anesthesia Evaluation  Patient identified by MRN, date of birth, ID band Patient awake    Reviewed: Allergy & Precautions, H&P , NPO status , Patient's Chart, lab work & pertinent test results, reviewed documented beta blocker date and time   Airway Mallampati: II TM Distance: >3 FB Neck ROM: Full    Dental  (+) Teeth Intact and Dental Advisory Given   Pulmonary neg pulmonary ROS,  breath sounds clear to auscultation        Cardiovascular hypertension, Pt. on medications + dysrhythmias Atrial Fibrillation Rhythm:Regular Rate:Normal  One episode of A Fib, Rx rythmol Denies CP, hx MI   Neuro/Psych negative neurological ROS  negative psych ROS   GI/Hepatic negative GI ROS, Neg liver ROS,   Endo/Other  Hypothyroidism Thyroid replacement  Renal/GU negative Renal ROS  negative genitourinary   Musculoskeletal   Abdominal   Peds negative pediatric ROS (+)  Hematology negative hematology ROS (+)   Anesthesia Other Findings   Reproductive/Obstetrics negative OB ROS                           Anesthesia Physical Anesthesia Plan  ASA: III  Anesthesia Plan: General   Post-op Pain Management:    Induction: Intravenous  Airway Management Planned: Oral ETT  Additional Equipment:   Intra-op Plan:   Post-operative Plan: Extubation in OR  Informed Consent: I have reviewed the patients History and Physical, chart, labs and discussed the procedure including the risks, benefits and alternatives for the proposed anesthesia with the patient or authorized representative who has indicated his/her understanding and acceptance.   Dental advisory given  Plan Discussed with: CRNA and Surgeon  Anesthesia Plan Comments:         Anesthesia Quick Evaluation

## 2012-02-25 NOTE — H&P (Signed)
[name] is an 71 y.o. female.   Chief Complaint:   Severe right hip pain HPI:   71 yo female with well-documented right hip arthritis.  She has daily pain and her mobility and quality of life have decreased.  She has failed conservative treatment with rest, NSAIDs, etc.  She wishes to proceed with a right total hip replacement.  She understands the risks of blood loss, nerve injury, fracture, dislocation, DVT, PE.  The goals are decreased pain and improved mobility/quality of life.  Past Medical History  Diagnosis Date  . Dysrhythmia     HX OF PAROXYSMAL ATRIAL FIB  . Hypertension   . Dyslipidemia   . Hypothyroidism   . Arthritis     OSTEOARTHRITIS   -- CONSTANT PAIN RIGHT HIP---AND PAIN LEFT KNEE--PT STATES SHE GETS INJECTIONS INTO HER KNEE  . Complication of anesthesia     BLOOD PRESSURE DROPPED WITH NASAL SURGERY, ONE OF THE CARPAL TUNNEL REPAIRS AND DURING A COLONOSCOPY    Past Surgical History  Procedure Date  . Bilateral carpal tunnel repair   . Back surgery   . Dilation and curettage of uterus 1969  . Surgery for ruptured ovarian cyst 1969  . Rhinoplasty for fractured nose 1986  . Left knee arthroscopy  2011    No family history on file. Social History:  reports that she has never smoked. She has never used smokeless tobacco. She reports that she does not drink alcohol or use illicit drugs.  Allergies:  Allergies  Allergen Reactions  . Lisinopril     LIP NUMBNESS    Medications Prior to Admission  Medication Dose Route Frequency Provider Last Rate Last Dose  . ceFAZolin (ANCEF) IVPB 1 g/50 mL premix  1 g Intravenous 60 min Pre-Op Kathryne Hitch, MD       No current outpatient prescriptions on file as of 02/25/2012.    No results found for this or any previous visit (from the past 48 hour(s)). No results found.  Review of Systems  All other systems reviewed and are negative.    Blood pressure 159/80, pulse 69, temperature 98 F (36.7 C),  temperature source Oral, resp. rate 20, SpO2 100.00%. Physical Exam  Constitutional: She is oriented to person, place, and time. She appears well-developed and well-nourished.  HENT:  Head: Normocephalic and atraumatic.  Eyes: EOM are normal. Pupils are equal, round, and reactive to light.  Neck: Normal range of motion. Neck supple.  Cardiovascular: Normal rate and regular rhythm.   Respiratory: Effort normal and breath sounds normal.  GI: Soft. Bowel sounds are normal.  Musculoskeletal:       Right hip: She exhibits decreased range of motion and bony tenderness.  Neurological: She is alert and oriented to person, place, and time.  Skin: Skin is warm and dry.  Psychiatric: She has a normal mood and affect.     Assessment/Plan To the OR today for a right total hip replacement.  Aneka Fagerstrom Y 02/25/2012, 7:13 AM

## 2012-02-25 NOTE — Progress Notes (Signed)
X-ray results noted 

## 2012-02-25 NOTE — Brief Op Note (Signed)
02/25/2012  9:09 AM  PATIENT:  [name]  71 y.o. female  PRE-OPERATIVE DIAGNOSIS:  Severe arthritis right hip  POST-OPERATIVE DIAGNOSIS:  Severe arthritis right hip  PROCEDURE:  Procedure(s) (LRB): TOTAL HIP ARTHROPLASTY ANTERIOR APPROACH (Right)  SURGEON:  Surgeon(s) and Role:    * Kathryne Hitch, MD - Primary  PHYSICIAN ASSISTANT:   ASSISTANTS: none   ANESTHESIA:   general  EBL:  Total I/O In: 2100 [I.V.:2100] Out: 575 [Urine:300; Blood:275]  BLOOD ADMINISTERED:none  DRAINS: none   LOCAL MEDICATIONS USED:  NONE  SPECIMEN:  No Specimen  DISPOSITION OF SPECIMEN:  N/A  COUNTS:  YES  TOURNIQUET:  * No tourniquets in log *  DICTATION: .Other Dictation: Dictation Number [id number]  PLAN OF CARE: Admit to inpatient   PATIENT DISPOSITION:  PACU - hemodynamically stable.   Delay start of Pharmacological VTE agent (>24hrs) due to surgical blood loss or risk of bleeding: not applicable

## 2012-02-25 NOTE — Transfer of Care (Signed)
Immediate Anesthesia Transfer of Care Note  Patient: [name]  Procedure(s) Performed: Procedure(s) (LRB): TOTAL HIP ARTHROPLASTY ANTERIOR APPROACH (Right)  Patient Location: PACU  Anesthesia Type: General  Level of Consciousness: awake, alert , oriented and patient cooperative  Airway & Oxygen Therapy: Patient Spontanous Breathing and Patient connected to face mask oxygen  Post-op Assessment: Report given to PACU RN and Post -op Vital signs reviewed and stable  Post vital signs: Reviewed and stable  Complications: No apparent anesthesia complications

## 2012-02-25 NOTE — Preoperative (Signed)
Beta Blockers   Reason not to administer Beta Blockers:Bisoprolol 5 mg this am 02-25-12 0430.

## 2012-02-25 NOTE — Progress Notes (Signed)
Portable AP pelvis and Lateral right hip x-rays done.

## 2012-02-26 LAB — BASIC METABOLIC PANEL
BUN: 7 mg/dL (ref 6–23)
GFR calc Af Amer: >90 mL/min (ref >90)
GFR calc non Af Amer: 89 mL/min — ABNORMAL LOW (ref >90)
Potassium: 3.4 mEq/L — ABNORMAL LOW (ref 3.5–5.1)
Sodium: 132 mEq/L — ABNORMAL LOW (ref 135–145)

## 2012-02-26 LAB — CBC
MCHC: 34.6 g/dL (ref 30.0–36.0)
RDW: 12.3 % (ref 11.5–15.5)

## 2012-02-26 MED ORDER — NONFORMULARY OR COMPOUNDED ITEM
1.0000 | Freq: Every day | Status: DC
  Filled 2012-02-26: qty 1

## 2012-02-26 MED ORDER — NON FORMULARY: 88.0000 ug | Freq: Every day | Status: DC

## 2012-02-26 MED ORDER — RIVAROXABAN 10 MG PO TABS: 10.0000 mg | ORAL_TABLET | Freq: Every day | ORAL | Status: DC

## 2012-02-26 MED ORDER — METHOCARBAMOL 500 MG PO TABS: 500.0000 mg | ORAL_TABLET | Freq: Four times a day (QID) | ORAL | Status: AC | PRN

## 2012-02-26 MED ORDER — NONFORMULARY OR COMPOUNDED ITEM
0.5000 | Freq: Every day | Status: DC
  Administered 2012-02-26 – 2012-02-28 (×3): 0.5 via ORAL

## 2012-02-26 MED ORDER — SODIUM CHLORIDE 0.9 % IV SOLN: INTRAVENOUS | Status: DC

## 2012-02-26 MED ORDER — NONFORMULARY OR COMPOUNDED ITEM
1.0000 | Freq: Every day | Status: DC
  Administered 2012-02-26 – 2012-02-28 (×3): 1 via ORAL

## 2012-02-26 MED ORDER — OXYCODONE-ACETAMINOPHEN 5-325 MG PO TABS: 1.0000 | ORAL_TABLET | ORAL | Status: AC | PRN

## 2012-02-26 NOTE — Op Note (Signed)
[name], [name] NO.:  [id number]  MEDICAL RECORD NO.:  [id number]  LOCATION:  1602                         FACILITY:  [facility]  PHYSICIAN:  Vanita Panda. Magnus Ivan, M.D.DATE OF BIRTH:  1941/10/05  DATE OF PROCEDURE:  02/25/2012 DATE OF DISCHARGE:                              OPERATIVE REPORT   PREOPERATIVE DIAGNOSIS:  End-stage arthritis, right hip.  POSTOPERATIVE DIAGNOSIS:  End-stage arthritis, right hip.  PROCEDURE:  Right total hip arthroplasty through direct anterior approach.  IMPLANTS:  Size 48 sector Gription acetabular component from DePuy; size 8 Corail femoral component with standard offset and collar, size 32, +1 ceramic hip ball.  SURGEON:  Vanita Panda. Magnus Ivan, M.D.  ANESTHESIA:  General.  ESTIMATED BLOOD LOSS:  Less than 400 cc.  COMPLICATIONS:  None.  INDICATIONS:  [name] is a 71 year old female with severe pain involving her right hip.  This has gotten worse with time and it has gotten worse,  affects her activities of daily living and mobility as well.  She has failed conservative treatment with rest, time, and anti- inflammatories, and even injection in her hip socket.  She wished to proceed with hip replacement at this point due to the severe pain.  The risks and benefits of this has been explained to her in detail.  She does wish to proceed.  DESCRIPTION OF PROCEDURE:  After informed consent was obtained, appropriate right hip was marked.  She was brought to the operating room.  General anesthesia was obtained while she was on her stretcher. A Foley catheter was placed and then boots were placed on her feet.  She was then placed on the Hana fracture table with perineal post in place. Both legs in inline skeletal traction, but no traction applied.  Her right hip was prepped and draped with DuraPrep and sterile drapes.  A time-out was called to identify the correct patient, correct right hip. I then made an incision  just inferior and posterior to the anterior superior iliac spine and carried this obliquely down the leg.  I dissected down to the tensor fascia lata and the tensor fascia was divided obliquely.  I then proceeded with a direct anterior approach to the hip.  The cobra retractors were placed around the lateral neck and then up underneath the rectus femoris, the medial retractor was placed. I coagulated the lateral femoral circumflex vessels with electrocautery. I then divided the hip capsule from the superior lateral, down towards the greater trochanter and then medial towards the lesser trochanter to have good 2 flaps of capsule tissue.  I then placed the cobra retractors within the capsule.  I made my femoral neck cut then proximal to the lesser trochanter, and placed a cork screw guide to femoral head and removed this in its entirety and found the femoral head devoid of cartilage.  I then cleaned the acetabulum of debris and placed a bent Hohmann medially and a Cobb retractor laterally.  I cleaned the remnants of the acetabular labrum and then the central area.  I then began reaming from size 42 reamer and 2 mm increments up to a size 48 with last reamer placed under direct fluoroscopy.  It was nice and  tight and had a good fill.  This under direct fluoroscopy, allowed me to get my inclination and version as well as depth of reaming.  I then placed the real size 48 acetabular component from DePuy, which was sector Gription components.  We knocked this into place under direct visualization and fluoroscopy.  I then placed the real 32+ 4 neutral polyethylene liner. Next, attention was turned to the femur.  All traction was off the legs. A temporary hook was placed underneath the vastus ridge and then the leg was extended and adducted.  I placed a Mueller medially and a long bent Homan underneath the greater trochanter.  I released the piriformis and brought the leg up.  I then used a box  cutting guide down the femoral canal and broached with a single size 8 broach, which actually filled the canal.  She is a petite person.  I then trialed a standard neck with a 32+ 1 head ball and we brought the leg back up and over, and then with traction and internal rotation reduced it into the acetabulum.  There was minimal to no shock stability with external and internal rotation as well as flexion.  I then brought the C-arm in and we were able to assess our leg length and component placement, and I was pleased.  I then removed all trial components after we dislocated the hip and I placed a real hydroxyapatite (HA) femoral component, size 8 with standard offset, and then the real 32+ 1 ceramic hip ball and reduced this back in the acetabulum and it was stable.  We copiously irrigated the tissues with normal saline solution and then closed the joint capsule with #1 Ethibond suture followed by running #1 Vicryl in the tensor fascia lata, 2-0 Vicryl in the subcutaneous tissue, and staples on the skin.  She was taken off the Hana table on to her stretcher, awakened, extubated, and taken to recovery room in stable condition.  All final counts were correct.  There were no complications noted.     Vanita Panda. Magnus Ivan, M.D.     CYB/MEDQ  D:  02/25/2012  T:  02/26/2012  Job:  [id number]

## 2012-02-26 NOTE — Progress Notes (Signed)
Subjective: 1 Day Post-Op Procedure(s) (LRB): TOTAL HIP ARTHROPLASTY ANTERIOR APPROACH (Right) Patient reports pain as moderate.    Objective: Vital signs in last 24 hours: Temp:  [97 F (36.1 C)-98.8 F (37.1 C)] 98.3 F (36.8 C) (03/30 1005) Pulse Rate:  [57-76] 66  (03/30 1005) Resp:  [12-18] 16  (03/30 1005) BP: (97-131)/(54-78) 113/62 mmHg (03/30 1005) SpO2:  [97 %-100 %] 99 % (03/30 1005) Weight:  [63.504 kg (140 lb)] 63.504 kg (140 lb) (03/29 1108)  Intake/Output from previous day: 03/29 0701 - 03/30 0700 In: 4891.3 [P.O.:1000; I.V.:3681.3; IV Piggyback:210] Out: 3125 [Urine:2850; Blood:275] Intake/Output this shift:     Basename 02/26/12 0413  HGB 10.9*    Basename 02/26/12 0413  WBC 8.4  RBC 3.22*  HCT 31.5*  PLT 167    Basename 02/26/12 0413  NA 132*  K 3.4*  CL 100  CO2 29  BUN 7  CREATININE 0.66  GLUCOSE 103*  CALCIUM 7.9*   No results found for this basename: LABPT:2,INR:2 in the last 72 hours  Neurovascular intact Sensation intact distally Intact pulses distally Incision: dressing C/D/I  Assessment/Plan: 1 Day Post-Op Procedure(s) (LRB): TOTAL HIP ARTHROPLASTY ANTERIOR APPROACH (Right) Up with therapy No hip precautions.  WBAT D/C PCA  Graceson Nichelson Y 02/26/2012, 10:22 AM

## 2012-02-26 NOTE — Discharge Instructions (Signed)
You can get your actual incision wet starting this Wed 03/01/12. Dry dressing daily as needed (at least very large band-aids). Call [facility] [phone] with questions or concerns. Follow-up with Dr. Magnus Ivan in 2 weeks.

## 2012-02-26 NOTE — Progress Notes (Signed)
Cm spoke with pt concerning dc planning. Pt given choice list for [facility]. [name] at bedside during interview. Spouse to assist in home care. Pt has Access to RW.  [name] [phone]

## 2012-02-26 NOTE — Evaluation (Signed)
Occupational Therapy Evaluation Patient Details Name: [name] MRN: [id number] DOB: [birth year] Today's Date: 02/26/2012  Problem List:  Patient Active Problem List  Diagnoses  . Degenerative arthritis of hip    Past Medical History:  Past Medical History  Diagnosis Date  . Dysrhythmia     HX OF PAROXYSMAL ATRIAL FIB  . Hypertension   . Dyslipidemia   . Hypothyroidism   . Arthritis     OSTEOARTHRITIS   -- CONSTANT PAIN RIGHT HIP---AND PAIN LEFT KNEE--PT STATES SHE GETS INJECTIONS INTO HER KNEE  . Complication of anesthesia     BLOOD PRESSURE DROPPED WITH NASAL SURGERY, ONE OF THE CARPAL TUNNEL REPAIRS AND DURING A COLONOSCOPY   Past Surgical History:  Past Surgical History  Procedure Date  . Bilateral carpal tunnel repair   . Back surgery   . Dilation and curettage of uterus 1969  . Surgery for ruptured ovarian cyst 1969  . Rhinoplasty for fractured nose 1986  . Left knee arthroscopy  2011    OT Assessment/Plan/Recommendation OT Assessment Clinical Impression Statement: This 71 yo female admitted for Rt. THA (anterior approach, no hip precautions).  Pt. demonstrates impaired functional mobility and inability to perform LB ADLs independently due to pain.  She will benefit from OT to [facility] safety and independence with BADLs to allow pt. to perform BADLs with supervision - min A.  Husband is supportive and will be available to assist at needed.  Pt. may benefit from AE, depending on pain and progress OT Recommendation/Assessment: Patient will need skilled OT in the acute care venue OT Problem List: Decreased activity tolerance;Decreased knowledge of use of DME or AE;Pain Barriers to Discharge: None OT Therapy Diagnosis : Generalized weakness;Acute pain OT Plan OT Frequency: Min 2X/week OT Treatment/Interventions: Self-care/ADL training;DME and/or AE instruction;Therapeutic activities;Patient/family education OT Recommendation Follow Up Recommendations: No OT follow  up;Supervision/Assistance - 24 hour Equipment Recommended: None recommended by OT Individuals Consulted Consulted and Agree with Results and Recommendations: Patient OT Goals Acute Rehab OT Goals OT Goal Formulation: With patient Time For Goal Achievement: 7 days ADL Goals Pt Will Perform Grooming: with modified independence;Standing at sink ADL Goal: Grooming - Progress: Goal set today Pt Will Perform Lower Body Bathing: with supervision;Sit to stand from chair;Sit to stand from bed ADL Goal: Lower Body Bathing - Progress: Goal set today Pt Will Perform Lower Body Dressing: with supervision;with adaptive equipment;Sit to stand from chair;Sit to stand from bed ADL Goal: Lower Body Dressing - Progress: Goal set today Pt Will Transfer to Toilet: Ambulation;with modified independence;Comfort height toilet ADL Goal: Toilet Transfer - Progress: Goal set today Pt Will Perform Toileting - Clothing Manipulation: with modified independence;Standing ADL Goal: Toileting - Clothing Manipulation - Progress: Goal set today Pt Will Perform Tub/Shower Transfer: Tub transfer;with supervision;Transfer tub bench ADL Goal: Tub/Shower Transfer - Progress: Goal set today  OT Evaluation Precautions/Restrictions  Precautions Precautions:  (none) Required Braces or Orthoses: No Restrictions Weight Bearing Restrictions: Yes RLE Weight Bearing: Weight bearing as tolerated Prior Functioning Home Living Lives With: Spouse Receives Help From: Family Type of Home: House Home Layout: One level Home Access: Stairs to enter Entrance Stairs-Rails: Left Entrance Stairs-Number of Steps: 5 Bathroom Shower/Tub: Forensic scientist: Handicapped height Bathroom Accessibility: Yes How Accessible: Accessible via walker Home Adaptive Equipment: Walker - rolling;Tub transfer bench;Raised toilet seat with rails Additional Comments: Pt borrowed DME from a friend Prior Function Level of  Independence: Independent with basic ADLs;Independent with gait;Independent with transfers Able to Take Stairs?:  Yes Driving: Yes ADL ADL Eating/Feeding: Simulated;Independent Where Assessed - Eating/Feeding: Chair Grooming: Performed;Wash/dry hands (min guard assist) Where Assessed - Grooming: Standing at sink Upper Body Bathing: Simulated;Set up Where Assessed - Upper Body Bathing: Sitting, chair Lower Body Bathing: Simulated;Moderate assistance Where Assessed - Lower Body Bathing: Sit to stand from chair Upper Body Dressing: Simulated;Set up Where Assessed - Upper Body Dressing: Unsupported;Sitting, chair Lower Body Dressing: Simulated;Maximal assistance Lower Body Dressing Details (indicate cue type and reason): Pt. limited by pain.  Able to bend forward to reach mid calf today Where Assessed - Lower Body Dressing: Sit to stand from bed Toilet Transfer: Performed (min guard assist) Toilet Transfer Method: Ambulating Toilet Transfer Equipment: Comfort height toilet;Grab bars Toileting - Clothing Manipulation: Performed (min guard assist) Where Assessed - Toileting Clothing Manipulation: Standing Toileting - Hygiene: Performed;Modified independent Where Assessed - Toileting Hygiene: Sit on 3-in-1 or toilet Equipment Used: Rolling walker Ambulation Related to ADLs: Pt. ambulated to and from bathroom with min guard assist ADL Comments: Pt ability to perform BADLs limited by pain.  Discussed tub transfer and use of tub bench.  Pt. able to verbalize correct method for transfer Vision/Perception    Cognition Cognition Arousal/Alertness: Awake/alert Overall Cognitive Status: Appears within functional limits for tasks assessed Orientation Level: Oriented X4 Sensation/Coordination   Extremity Assessment RUE Assessment RUE Assessment: Within Functional Limits LUE Assessment LUE Assessment: Within Functional Limits Mobility  Bed Mobility Bed Mobility: Yes Sit to Supine: 4: Min  assist;HOB flat;With rail Sit to Supine - Details (indicate cue type and reason): assist for Rt. LE Transfers Transfers: Yes Sit to Stand: From chair/3-in-1;From bed;With armrests;With upper extremity assist (min guard assist) Stand to Sit: With upper extremity assist;With armrests;To bed;To chair/3-in-1;To toilet (min guard assist) Exercises   End of Session OT - End of Session Activity Tolerance: Patient limited by pain Patient left: in bed;with call bell in reach;with family/visitor present General Behavior During Session: [facility] for tasks performed Cognition: [facility] for tasks performed   Charlize Hathaway, Ursula Alert M 02/26/2012, 3:52 PM

## 2012-02-26 NOTE — Progress Notes (Signed)
PM Session PT Cancellation Note  ___Treatment cancelled today due to medical issues with patient which prohibited therapy  ___ Treatment cancelled today due to patient receiving procedure or test   ___ Treatment cancelled today due to patient's refusal to participate   _X_ PM Treatment cancelled per pt request to rest.  Just got back to bed from the bathroom.  Felecia Shelling  PTA [facility]  Acute  Rehab Pager     [phone]

## 2012-02-26 NOTE — Evaluation (Signed)
Physical Therapy Evaluation Patient Details Name: [name] MRN: [id number] DOB: 26-Dec-1940 Today's Date: 02/26/2012  Problem List:  Patient Active Problem List  Diagnoses  . Degenerative arthritis of hip    Past Medical History:  Past Medical History  Diagnosis Date  . Dysrhythmia     HX OF PAROXYSMAL ATRIAL FIB  . Hypertension   . Dyslipidemia   . Hypothyroidism   . Arthritis     OSTEOARTHRITIS   -- CONSTANT PAIN RIGHT HIP---AND PAIN LEFT KNEE--PT STATES SHE GETS INJECTIONS INTO HER KNEE  . Complication of anesthesia     BLOOD PRESSURE DROPPED WITH NASAL SURGERY, ONE OF THE CARPAL TUNNEL REPAIRS AND DURING A COLONOSCOPY   Past Surgical History:  Past Surgical History  Procedure Date  . Bilateral carpal tunnel repair   . Back surgery   . Dilation and curettage of uterus 1969  . Surgery for ruptured ovarian cyst 1969  . Rhinoplasty for fractured nose 1986  . Left knee arthroscopy  2011    PT Assessment/Plan/Recommendation PT Assessment Clinical Impression Statement: Pt with R THR presents with decreased R LE strength/ROM and ltd functional mobility PT Recommendation/Assessment: Patient will need skilled PT in the acute care venue PT Problem List: Decreased strength;Decreased range of motion;Decreased activity tolerance;Decreased mobility;Decreased knowledge of use of DME;Pain PT Therapy Diagnosis : Difficulty walking PT Plan PT Frequency: 7X/week PT Treatment/Interventions: Stair training;Gait training;DME instruction;Functional mobility training;Therapeutic activities;Therapeutic exercise PT Recommendation Recommendations for Other Services: OT consult Follow Up Recommendations: Home health PT Equipment Recommended: None recommended by PT PT Goals  Acute Rehab PT Goals PT Goal Formulation: With patient Time For Goal Achievement: 7 days Pt will go Supine/Side to Sit: with supervision PT Goal: Supine/Side to Sit - Progress: Goal set today Pt will go Sit to  Supine/Side: with supervision PT Goal: Sit to Supine/Side - Progress: Goal set today Pt will go Sit to Stand: with supervision PT Goal: Sit to Stand - Progress: Goal set today Pt will go Stand to Sit: with supervision PT Goal: Stand to Sit - Progress: Goal set today Pt will Ambulate: 51 - 150 feet;with supervision;with rolling walker PT Goal: Ambulate - Progress: Goal set today Pt will Go Up / Down Stairs: 3-5 stairs;with min assist;with least restrictive assistive device PT Goal: Up/Down Stairs - Progress: Goal set today  PT Evaluation Precautions/Restrictions  Precautions Required Braces or Orthoses: No Restrictions Weight Bearing Restrictions: Yes RLE Weight Bearing: Weight bearing as tolerated Prior Functioning  Home Living Lives With: Spouse Receives Help From: Family Type of Home: House Home Layout: One level Home Access: Stairs to enter Entrance Stairs-Rails: Left Entrance Stairs-Number of Steps: 5 (5 stairs with rail + one into house) Home Adaptive Equipment: Walker - rolling Prior Function Level of Independence: Independent with basic ADLs;Independent with gait;Independent with transfers Able to Take Stairs?: Yes Driving: Yes Cognition Cognition Arousal/Alertness: Awake/alert Overall Cognitive Status: Appears within functional limits for tasks assessed Orientation Level: Oriented X4 Sensation/Coordination Coordination Gross Motor Movements are Fluid and Coordinated: Yes Extremity Assessment RUE Assessment RUE Assessment: Within Functional Limits LUE Assessment LUE Assessment: Within Functional Limits RLE Assessment RLE Assessment: Exceptions to [facility] (2+/5 at hip; 3/5 quads; ext and abd pain ltd) LLE Assessment LLE Assessment: Within Functional Limits Mobility (including Balance) Bed Mobility Bed Mobility: Yes Supine to Sit: 3: Mod assist Supine to Sit Details (indicate cue type and reason): cues for sequence and for use of UEs to self  assist Transfers Transfers: Yes Sit to Stand: 3: Mod  assist Sit to Stand Details (indicate cue type and reason): cues for LE position and for use of UEs Stand to Sit: 4: Min assist;3: Mod assist;To chair/3-in-1;With armrests;With upper extremity assist Stand to Sit Details: cues for LE position and for use of UEs Ambulation/Gait Ambulation/Gait: Yes Ambulation/Gait Assistance: 4: Min assist;3: Mod assist Ambulation/Gait Assistance Details (indicate cue type and reason): cues for posture, position from RW and sequence Ambulation Distance (Feet): 44 Feet Assistive device: Rolling walker Gait Pattern: Step-to pattern    Exercise  Total Joint Exercises Ankle Circles/Pumps: AROM;15 reps;Supine;Both Heel Slides: AAROM;10 reps;Supine;Right Hip ABduction/ADduction: AAROM;10 reps;Supine;Right End of Session PT - End of Session Equipment Utilized During Treatment: Gait belt Activity Tolerance: Patient tolerated treatment well Patient left: in chair;with call bell in reach Nurse Communication: Mobility status for transfers;Mobility status for ambulation General Behavior During Session: [facility] for tasks performed Cognition: [facility] for tasks performed  [name] 02/26/2012, 12:55 PM

## 2012-02-27 LAB — CBC
Hemoglobin: 10 g/dL — ABNORMAL LOW (ref 12.0–15.0)
MCHC: 34.6 g/dL (ref 30.0–36.0)
RBC: 2.96 MIL/uL — ABNORMAL LOW (ref 3.87–5.11)

## 2012-02-27 NOTE — Progress Notes (Signed)
Physical Therapy Treatment Patient Details Name: [name] MRN: [id number] DOB: 1941-11-23 Today's Date: 02/27/2012  PT Assessment/Plan  PT - Assessment/Plan Comments on Treatment Session: pt doing well, ambulating in hallway with family ,multiple times this am. will see again for ther ex, pt ready to be D/C'd from PT standpoint. PT Plan: Discharge plan remains appropriate;Frequency remains appropriate PT Frequency: 7X/week Follow Up Recommendations: Home health PT PT Goals  Acute Rehab PT Goals Pt will go Stand to Sit: with supervision PT Goal: Stand to Sit - Progress: Met Pt will Ambulate: 51 - 150 feet;with supervision;with rolling walker PT Goal: Ambulate - Progress: Met Pt will Go Up / Down Stairs: 3-5 stairs;with min assist;with least restrictive assistive device PT Goal: Up/Down Stairs - Progress: Met Pt will Perform Home Exercise Program: with supervision, verbal cues required/provided PT Goal: Perform Home Exercise Program - Progress: Goal set today  PT Treatment Precautions/Restrictions  Precautions Precautions:  (none) Required Braces or Orthoses: No Restrictions Weight Bearing Restrictions: Yes RLE Weight Bearing: Weight bearing as tolerated Mobility (including Balance) Ambulation/Gait Ambulation/Gait Assistance: 5: Supervision;6: Modified independent (Device/Increase time) Ambulation Distance (Feet): 300 Feet Assistive device: Rolling walker Gait Pattern: Step-through pattern Stairs: Yes Stairs Assistance: 4: Min assist Stairs Assistance Details (indicate cue type and reason): cues for sequence Stair Management Technique: One rail Left;Sideways Number of Stairs: 4     Exercise    End of Session  pt in chair with call bell and family present. Reports feeling well.  [facility] 02/27/2012, 11:18 AM

## 2012-02-27 NOTE — Progress Notes (Signed)
OT Cancellation Note  ___Treatment cancelled today due to medical issues with patient which prohibited therapy  ___ Treatment cancelled today due to patient receiving procedure or test   __x_ Treatment cancelled today due to patient's refusal to participate. Stated she was too worn out from PT. Did verbally instruct and demo how to safely use tub transfer bench. Will check back as schedule permits.  ___ Treatment cancelled today due to   Signature: Garrel Ridgel, OTR/L  Pager [phone] 02/27/2012

## 2012-02-27 NOTE — Progress Notes (Signed)
Patient ID: [name], female   DOB: January 30, 1941, 71 y.o.   MRN: [id number] Patient walking independently , plan for d/c Monday. Labs stable

## 2012-02-27 NOTE — Progress Notes (Signed)
PT NOTE  Pt instructed in anterior THA exercises. Reviewed and did 3-5 each exercise. Pt did not want to practice all exercises due to fatigue.  Will see in am.

## 2012-02-28 ENCOUNTER — Encounter (HOSPITAL_COMMUNITY): Payer: Self-pay | Admitting: Orthopaedic Surgery

## 2012-02-28 LAB — CBC
MCV: 97.9 fL (ref 78.0–100.0)
Platelets: 159 10*3/uL (ref 150–400)
RBC: 2.87 MIL/uL — ABNORMAL LOW (ref 3.87–5.11)
WBC: 8.3 10*3/uL (ref 4.0–10.5)

## 2012-02-28 NOTE — Progress Notes (Signed)
UR COMPLETED  

## 2012-02-28 NOTE — Progress Notes (Signed)
Physical Therapy Treatment Patient Details Name: [name] MRN: [id number] DOB: 04-25-1941 Today's Date: 02/28/2012  PT Assessment/Plan  PT - Assessment/Plan Comments on Treatment Session: pt has progressed nicely, pleased with progress PT Plan: Discharge plan remains appropriate;Frequency remains appropriate Follow Up Recommendations: Home health PT PT Goals  Acute Rehab PT Goals Pt will go Supine/Side to Sit: with supervision PT Goal: Supine/Side to Sit - Progress: Met Pt will go Sit to Supine/Side: with supervision PT Goal: Sit to Supine/Side - Progress: Met Pt will go Sit to Stand: with supervision PT Goal: Sit to Stand - Progress: Met Pt will go Stand to Sit: with supervision PT Goal: Stand to Sit - Progress: Met Pt will Ambulate: 51 - 150 feet;with supervision;with rolling walker PT Goal: Ambulate - Progress: Met Pt will Go Up / Down Stairs: 3-5 stairs;with min assist;with least restrictive assistive device PT Goal: Up/Down Stairs - Progress: Met Pt will Perform Home Exercise Program: with supervision, verbal cues required/provided PT Goal: Perform Home Exercise Program - Progress: Met  PT Treatment Precautions/Restrictions  Precautions Precautions:  (none) Required Braces or Orthoses: No Restrictions Weight Bearing Restrictions: Yes RLE Weight Bearing: Weight bearing as tolerated Mobility (including Balance) Bed Mobility Supine to Sit: 4: Min assist;5: Supervision;HOB flat Supine to Sit Details (indicate cue type and reason): min to supervision with RLE, increased time; practiced to right x2 as this is side of bedpt uses at home Sit to Supine: 4: Min assist;5: Supervision;HOB flat Sit to Supine - Details (indicate cue type and reason): cues for technique Transfers Sit to Stand: From bed;6: Modified independent (Device/Increase time) Stand to Sit: 6: Modified independent (Device/Increase time);To bed Ambulation/Gait Ambulation/Gait: No Ambulation/Gait  Assistance: 6: Modified independent (Device/Increase time) Ambulation/Gait Assistance Details (indicate cue type and reason): pt amb mod I in hallway earlier this am Assistive device: Rolling walker    Exercise  Total Joint Exercises Hip ABduction/ADduction: AROM;Right;10 reps;Standing Long Arc Quad: AROM;10 reps;Right;Seated Marching in Standing: AROM;5 reps;Right;Standing Standing Hip Extension: AROM;Right;5 reps;Standing End of Session PT - End of Session Activity Tolerance: Patient tolerated treatment well Patient left: in bed;with call bell in reach General Behavior During Session: [facility] for tasks performed Cognition: [facility] for tasks performed  [facility] 02/28/2012, 11:00 AM

## 2012-02-28 NOTE — Progress Notes (Signed)
CARE MANAGEMENT NOTE 02/28/2012  Patient:  [name], [name]   Account Number:  [id number]  Date Initiated:  02/26/2012  Documentation initiated by:  DAVIS,TYMEEKA  Subjective/Objective Assessment:   71 yo female admitted s/p right total hip replacement. PTA pt lived with spouse.     Action/Plan:   home when stable   Anticipated DC Date:  02/28/2012   Anticipated DC Plan:  HOME W HOME HEALTH SERVICES  In-house referral  NA      DC Planning Services  CM consult      PAC Choice  DURABLE MEDICAL EQUIPMENT  HOME HEALTH   Choice offered to / List presented to:  C-1 Patient   DME arranged  NA      DME agency  NA     HH arranged  HH-2 PT      HH agency  [facility]   Status of service:  Completed, signed off Medicare Important Message given?  NA - LOS <3 / Initial given by admissions (If response is "NO", the following Medicare IM given date fields will be blank) Date Medicare IM given:   Date Additional Medicare IM given:    Discharge Disposition:  HOME W HOME HEALTH SERVICES   Comments:  02/27/2012 Raynelle Bring BSN CCM [phone] CM requested [facility] agency choice. Pt wants [facility] for [facility] services. States she already has RW and 3n1 at home. List of HH agenciesi in shadow chart. [facility] notified of request and can service patient with hh services with start date of tomorrow.

## 2012-02-28 NOTE — Discharge Summary (Signed)
Physician Discharge Summary  Patient ID: [name] MRN: [id number] DOB/AGE: 1941/09/02 71 y.o.  Admit date: 02/25/2012 Discharge date: 02/28/2012  Admission Diagnoses:  Degenerative arthritis of hip right  Discharge Diagnoses:  Principal Problem:  *Degenerative arthritis of hip right  Past Medical History  Diagnosis Date  . Dysrhythmia     HX OF PAROXYSMAL ATRIAL FIB  . Hypertension   . Dyslipidemia   . Hypothyroidism   . Arthritis     OSTEOARTHRITIS   -- CONSTANT PAIN RIGHT HIP---AND PAIN LEFT KNEE--PT STATES SHE GETS INJECTIONS INTO HER KNEE  . Complication of anesthesia     BLOOD PRESSURE DROPPED WITH NASAL SURGERY, ONE OF THE CARPAL TUNNEL REPAIRS AND DURING A COLONOSCOPY    Surgeries: Procedure(s): TOTAL HIP ARTHROPLASTY ANTERIOR APPROACH on 02/25/2012   Consultants (if any):  none Discharged Condition: Improved  Hospital Course: [name] is an 71 y.o. female who was admitted 02/25/2012 with a diagnosis of Degenerative arthritis of hip and went to the operating room on 02/25/2012 and underwent the above named procedures.    She was given perioperative antibiotics:  Anti-infectives     Start     Dose/Rate Route Frequency Ordered Stop   02/25/12 1400   ceFAZolin (ANCEF) IVPB 1 g/50 mL premix        1 g 100 mL/hr over 30 Minutes Intravenous Every 6 hours 02/25/12 1117 02/26/12 0224   02/25/12 0533   ceFAZolin (ANCEF) IVPB 1 g/50 mL premix        1 g 100 mL/hr over 30 Minutes Intravenous 60 min pre-op 02/25/12 0533 02/25/12 0732        .  She was given sequential compression devices, early ambulation, and chemoprophylaxis for DVT prophylaxis.  She benefited maximally from the hospital stay and there were no complications.    Recent vital signs:  Filed Vitals:   02/28/12 0610  BP: 138/75  Pulse: 73  Temp: 98.2 F (36.8 C)  Resp: 16    Recent laboratory studies:  Lab Results  Component Value Date   HGB 9.8* 02/28/2012   HGB 10.0*  02/27/2012   HGB 10.9* 02/26/2012   Lab Results  Component Value Date   WBC 8.3 02/28/2012   PLT 159 02/28/2012   Lab Results  Component Value Date   INR 1.02 02/21/2012   Lab Results  Component Value Date   NA 132* 02/26/2012   K 3.4* 02/26/2012   CL 100 02/26/2012   CO2 29 02/26/2012   BUN 7 02/26/2012   CREATININE 0.66 02/26/2012   GLUCOSE 103* 02/26/2012    Discharge Medications:   Medication List  As of 02/28/2012  1:04 PM   STOP taking these medications         aspirin 81 MG chewable tablet         TAKE these medications         bisoprolol 5 MG tablet   Commonly known as: ZEBETA   Take 5 mg by mouth 2 (two) times daily.      clopidogrel 75 MG tablet   Commonly known as: PLAVIX   Take 75 mg by mouth every morning.      estradiol 1 MG tablet   Commonly known as: ESTRACE   Take 1 mg by mouth every morning. PT STATES SHE WANTS ESTRACE--NOT THE GENERIC      fish oil-omega-3 fatty acids 1000 MG capsule   Take 1 g by mouth every morning.      Glucosamine  500 MG Caps   Take by mouth. 2 ONCE A DAY  ( 1000MG  DAILY)      hydrALAZINE 25 MG tablet   Commonly known as: APRESOLINE   Take 25 mg by mouth 2 (two) times daily.      hydrochlorothiazide 12.5 MG capsule   Commonly known as: MICROZIDE   Take 12.5 mg by mouth every morning.      levothyroxine 88 MCG tablet   Commonly known as: SYNTHROID, LEVOTHROID   Take 88 mcg by mouth every morning. PT STATES SHE NEEDS THE SYNTHROID--DOES NOT WANT THE GENERIC      losartan 50 MG tablet   Commonly known as: COZAAR   Take 50 mg by mouth every morning.      methocarbamol 500 MG tablet   Commonly known as: ROBAXIN   Take 1 tablet (500 mg total) by mouth every 6 (six) hours as needed.      mulitivitamin with minerals Tabs   Take 1 tablet by mouth every morning.      norethindrone 5 MG tablet   Commonly known as: AYGESTIN   Take 2.5 mg by mouth daily. TAKES ONLY 1/2 TABLET  EVER AM      oxyCODONE-acetaminophen 5-325 MG per  tablet   Commonly known as: PERCOCET   Take 1-2 tablets by mouth every 4 (four) hours as needed for pain.      potassium chloride 10 MEQ tablet   Commonly known as: K-DUR,KLOR-CON   Take 10 mEq by mouth 2 (two) times daily.      propafenone 225 MG tablet   Commonly known as: RYTHMOL   Take 225 mg by mouth 2 times daily at 12 noon and 4 pm.      rivaroxaban 10 MG Tabs tablet   Commonly known as: XARELTO   Take 1 tablet (10 mg total) by mouth daily with breakfast.            Diagnostic Studies: Dg Chest 2 View  02/21/2012  *RADIOLOGY REPORT*  Clinical Data: Preop.  CHEST - 2 VIEW  Comparison: None.  Findings: Trachea is midline.  Heart is at the upper limits of normal in size.  Lungs are clear.  No pleural fluid.  IMPRESSION: No acute findings.  Original Report Authenticated By: Reyes Ivan, M.D.   Dg Hip Complete Right  02/25/2012  *RADIOLOGY REPORT*  Clinical Data: Hip arthroplasty.  RIGHT HIP - COMPLETE 2+ VIEW  Comparison: None.  Findings: 2 intraoperative views.  Right hip arthroplasty. No acute hardware complication.  No periprosthetic fracture.  IMPRESSION: Intraoperative imaging of right hip arthroplasty.  Original Report Authenticated By: Consuello Bossier, M.D.   Dg Pelvis Portable  02/25/2012  *RADIOLOGY REPORT*  Clinical Data: 71 year old female status post right total hip replacement.  PORTABLE PELVIS  Comparison: Intraoperative radiographs from 0735 hours the same day.  Findings: Portable supine AP view the pelvis at 0937 hours. Sequelae of bipolar right hip arthroplasty.  Components appear intact and normally aligned on this single view.  Postoperative changes to the overlying soft tissues.  No unexpected osseous changes.  IMPRESSION: Right hip arthroplasty with no adverse features.  Original Report Authenticated By: Harley Hallmark, M.D.   Dg Hip Portable 1 View Right  02/25/2012  *RADIOLOGY REPORT*  Clinical Data: Right total hip arthroplasty.  PORTABLE RIGHT HIP - 1  VIEW  Comparison: 02/25/2012 postoperative radiograph.  Findings: Right total hip arthroplasty is present.  No complicating features.  The arthroplasty appears located.  The  femoral stem appears intact. Expected postoperative changes in the soft tissues with skin staples.  IMPRESSION: Uncomplicated new right total hip arthroplasty with expected postoperative changes.  Original Report Authenticated By: Andreas Newport, M.D.    Disposition: 01-Home or Self Care  Discharge Orders    Future Orders Please Complete By Expires   Diet - low sodium heart healthy      Call MD / Call 911      Comments:   If you experience chest pain or shortness of breath, CALL 911 and be transported to the hospital emergency room.  If you develope a fever above 101 F, pus (white drainage) or increased drainage or redness at the wound, or calf pain, call your surgeon's office.   Constipation Prevention      Comments:   Drink plenty of fluids.  Prune juice may be helpful.  You may use a stool softener, such as Colace (over the counter) 100 mg twice a day.  Use MiraLax (over the counter) for constipation as needed.   Increase activity slowly as tolerated      Weight Bearing as taught in Physical Therapy      Comments:   Use a walker or crutches as instructed.         Signed: Wende Owens 02/28/2012, 1:04 PM

## 2012-02-28 NOTE — Progress Notes (Signed)
Subjective: Doing well with PT.  Ready for discharge. Pain well controlled   Objective: Vital signs in last 24 hours: Temp:  [98.2 F (36.8 C)-98.7 F (37.1 C)] 98.2 F (36.8 C) (04/01 0610) Pulse Rate:  [73-74] 73  (04/01 0610) Resp:  [16-17] 16  (04/01 0610) BP: (103-138)/(56-75) 138/75 mmHg (04/01 0610) SpO2:  [97 %-99 %] 99 % (04/01 0610)  Intake/Output from previous day: 03/31 0701 - 04/01 0700 In: 900 [P.O.:900] Out: 1000 [Urine:1000] Intake/Output this shift:     Basename 02/28/12 0423 02/27/12 0420 02/26/12 0413  HGB 9.8* 10.0* 10.9*    Basename 02/28/12 0423 02/27/12 0420  WBC 8.3 8.6  RBC 2.87* 2.96*  HCT 28.1* 28.9*  PLT 159 152    Basename 02/26/12 0413  NA 132*  K 3.4*  CL 100  CO2 29  BUN 7  CREATININE 0.66  GLUCOSE 103*  CALCIUM 7.9*   No results found for this basename: LABPT:2,INR:2 in the last 72 hours  Neurovascular intact Intact pulses distally Dorsiflexion/Plantar flexion intact Incision: no drainage  Assessment/Plan:  Discharge home today after PT Rx on chart.  OV 2weeks COD stable  Renia Mikelson M 02/28/2012, 7:05 AM

## 2012-02-28 NOTE — Progress Notes (Signed)
Patient OOB this am to window seat. She ate 100% of her breakfast. Dressing changed to R Hip with scant drainage noted. States she has pain with movement ice pack applied to hip. Discharge instructions, education and medications reviewed with her and husband. Husband shown how to perform her dressing change. Prescriptions given to her awaiting discharge to home.

## 2012-03-02 NOTE — Discharge Summary (Signed)
Patient ID: [name] MRN: [id number] DOB/AGE: 06/18/1941 71 y.o.  Admit date: 02/25/2012 Discharge date: 03/02/2012  Admission Diagnoses:  Principal Problem:  *Degenerative arthritis of hip   Discharge Diagnoses:  Same  Past Medical History  Diagnosis Date  . Dysrhythmia     HX OF PAROXYSMAL ATRIAL FIB  . Hypertension   . Dyslipidemia   . Hypothyroidism   . Arthritis     OSTEOARTHRITIS   -- CONSTANT PAIN RIGHT HIP---AND PAIN LEFT KNEE--PT STATES SHE GETS INJECTIONS INTO HER KNEE  . Complication of anesthesia     BLOOD PRESSURE DROPPED WITH NASAL SURGERY, ONE OF THE CARPAL TUNNEL REPAIRS AND DURING A COLONOSCOPY    Surgeries: Procedure(s): TOTAL HIP ARTHROPLASTY ANTERIOR APPROACH on 02/25/2012   Consultants:    Discharged Condition: Improved  Hospital Course: [name] is an 71 y.o. female who was admitted 02/25/2012 for operative treatment ofDegenerative arthritis of hip. Patient has severe unremitting pain that affects sleep, daily activities, and work/hobbies. After pre-op clearance the patient was taken to the operating room on 02/25/2012 and underwent  Procedure(s): TOTAL HIP ARTHROPLASTY ANTERIOR APPROACH.    Patient was given perioperative antibiotics:  Anti-infectives     Start     Dose/Rate Route Frequency Ordered Stop   02/25/12 1400   ceFAZolin (ANCEF) IVPB 1 g/50 mL premix        1 g 100 mL/hr over 30 Minutes Intravenous Every 6 hours 02/25/12 1117 02/26/12 0224   02/25/12 0533   ceFAZolin (ANCEF) IVPB 1 g/50 mL premix        1 g 100 mL/hr over 30 Minutes Intravenous 60 min pre-op 02/25/12 0533 02/25/12 0732           Patient was given sequential compression devices, early ambulation, and chemoprophylaxis to prevent DVT.  Patient benefited maximally from hospital stay and there were no complications.    Recent vital signs: No data found.    Recent laboratory studies: No results found for this basename:  WBC:2,HGB:2,HCT:2,PLT:2,NA:2,K:2,CL:2,CO2:2,BUN:2,CREATININE:2,GLUCOSE:2,PT:2,INR:2,CALCIUM,2: in the last 72 hours   Discharge Medications:   Medication List  As of 03/02/2012  8:35 AM   STOP taking these medications         aspirin 81 MG chewable tablet         TAKE these medications         bisoprolol 5 MG tablet   Commonly known as: ZEBETA   Take 5 mg by mouth 2 (two) times daily.      clopidogrel 75 MG tablet   Commonly known as: PLAVIX   Take 75 mg by mouth every morning.      estradiol 1 MG tablet   Commonly known as: ESTRACE   Take 1 mg by mouth every morning. PT STATES SHE WANTS ESTRACE--NOT THE GENERIC      fish oil-omega-3 fatty acids 1000 MG capsule   Take 1 g by mouth every morning.      Glucosamine 500 MG Caps   Take by mouth. 2 ONCE A DAY  ( 1000MG  DAILY)      hydrALAZINE 25 MG tablet   Commonly known as: APRESOLINE   Take 25 mg by mouth 2 (two) times daily.      hydrochlorothiazide 12.5 MG capsule   Commonly known as: MICROZIDE   Take 12.5 mg by mouth every morning.      levothyroxine 88 MCG tablet   Commonly known as: SYNTHROID, LEVOTHROID   Take 88 mcg by mouth every morning. PT STATES SHE  NEEDS THE SYNTHROID--DOES NOT WANT THE GENERIC      losartan 50 MG tablet   Commonly known as: COZAAR   Take 50 mg by mouth every morning.      methocarbamol 500 MG tablet   Commonly known as: ROBAXIN   Take 1 tablet (500 mg total) by mouth every 6 (six) hours as needed.      mulitivitamin with minerals Tabs   Take 1 tablet by mouth every morning.      norethindrone 5 MG tablet   Commonly known as: AYGESTIN   Take 2.5 mg by mouth daily. TAKES ONLY 1/2 TABLET  EVER AM      oxyCODONE-acetaminophen 5-325 MG per tablet   Commonly known as: PERCOCET   Take 1-2 tablets by mouth every 4 (four) hours as needed for pain.      potassium chloride 10 MEQ tablet   Commonly known as: K-DUR,KLOR-CON   Take 10 mEq by mouth 2 (two) times daily.      propafenone 225  MG tablet   Commonly known as: RYTHMOL   Take 225 mg by mouth 2 times daily at 12 noon and 4 pm.      rivaroxaban 10 MG Tabs tablet   Commonly known as: XARELTO   Take 1 tablet (10 mg total) by mouth daily with breakfast.            Diagnostic Studies: Dg Chest 2 View  02/21/2012  *RADIOLOGY REPORT*  Clinical Data: Preop.  CHEST - 2 VIEW  Comparison: None.  Findings: Trachea is midline.  Heart is at the upper limits of normal in size.  Lungs are clear.  No pleural fluid.  IMPRESSION: No acute findings.  Original Report Authenticated By: Reyes Ivan, M.D.   Dg Hip Complete Right  02/25/2012  *RADIOLOGY REPORT*  Clinical Data: Hip arthroplasty.  RIGHT HIP - COMPLETE 2+ VIEW  Comparison: None.  Findings: 2 intraoperative views.  Right hip arthroplasty. No acute hardware complication.  No periprosthetic fracture.  IMPRESSION: Intraoperative imaging of right hip arthroplasty.  Original Report Authenticated By: Consuello Bossier, M.D.   Dg Pelvis Portable  02/25/2012  *RADIOLOGY REPORT*  Clinical Data: 71 year old female status post right total hip replacement.  PORTABLE PELVIS  Comparison: Intraoperative radiographs from 0735 hours the same day.  Findings: Portable supine AP view the pelvis at 0937 hours. Sequelae of bipolar right hip arthroplasty.  Components appear intact and normally aligned on this single view.  Postoperative changes to the overlying soft tissues.  No unexpected osseous changes.  IMPRESSION: Right hip arthroplasty with no adverse features.  Original Report Authenticated By: Harley Hallmark, M.D.   Dg Hip Portable 1 View Right  02/25/2012  *RADIOLOGY REPORT*  Clinical Data: Right total hip arthroplasty.  PORTABLE RIGHT HIP - 1 VIEW  Comparison: 02/25/2012 postoperative radiograph.  Findings: Right total hip arthroplasty is present.  No complicating features.  The arthroplasty appears located.  The femoral stem appears intact. Expected postoperative changes in the soft tissues  with skin staples.  IMPRESSION: Uncomplicated new right total hip arthroplasty with expected postoperative changes.  Original Report Authenticated By: Andreas Newport, M.D.    Disposition: 06-Home-Health Care Svc  Discharge Orders    Future Orders Please Complete By Expires   Diet - low sodium heart healthy      Call MD / Call 911      Comments:   If you experience chest pain or shortness of breath, CALL 911 and be transported to  the hospital emergency room.  If you develope a fever above 101 F, pus (white drainage) or increased drainage or redness at the wound, or calf pain, call your surgeon's office.   Constipation Prevention      Comments:   Drink plenty of fluids.  Prune juice may be helpful.  You may use a stool softener, such as Colace (over the counter) 100 mg twice a day.  Use MiraLax (over the counter) for constipation as needed.   Increase activity slowly as tolerated      Weight Bearing as taught in Physical Therapy      Comments:   Use a walker or crutches as instructed.         SignedKathryne Hitch 03/02/2012, 8:35 AM

## 2012-09-21 ENCOUNTER — Encounter: Payer: Self-pay | Admitting: Gynecology

## 2012-09-21 ENCOUNTER — Ambulatory Visit (INDEPENDENT_AMBULATORY_CARE_PROVIDER_SITE_OTHER): Payer: Medicare Other | Admitting: Gynecology

## 2012-09-21 VITALS — BP 132/84 | Ht 63.0 in | Wt 141.0 lb

## 2012-09-21 DIAGNOSIS — Z79890 Hormone replacement therapy (postmenopausal): Secondary | ICD-10-CM

## 2012-09-21 DIAGNOSIS — N951 Menopausal and female climacteric states: Secondary | ICD-10-CM

## 2012-09-21 DIAGNOSIS — N952 Postmenopausal atrophic vaginitis: Secondary | ICD-10-CM

## 2012-09-21 DIAGNOSIS — R32 Unspecified urinary incontinence: Secondary | ICD-10-CM

## 2012-09-21 MED ORDER — ESTRADIOL 1 MG PO TABS: 1.0000 mg | ORAL_TABLET | ORAL | Status: DC

## 2012-09-21 MED ORDER — NORETHINDRONE ACETATE 5 MG PO TABS: 2.5000 mg | ORAL_TABLET | Freq: Every day | ORAL | Status: DC

## 2012-09-21 NOTE — Patient Instructions (Addendum)
Followup in one year for annual exam.   Call to Schedule your mammogram  Facilities in [place]: 1)  [facility], [address] Phone: [phone] 2)  [facility], [address] Phone: [phone] 3)  Dr. Bertrand at [facility]  [address] Phone: [phone]     Mammogram A mammogram is an X-ray test to find changes in a woman's breast. You should get a mammogram if:  You are 69 years of age or older  You have risk factors.   Your doctor recommends that you have one.  BEFORE THE TEST  Do not schedule the test the week before your period, especially if your breasts are sore during this time.  On the day of your mammogram:  Wash your breasts and armpits well. After washing, do not put on any deodorant or talcum powder on until after your test.   Eat and drink as you usually do.   Take your medicines as usual.   If you are diabetic and take insulin, make sure you:   Eat before coming for your test.   Take your insulin as usual.   If you cannot keep your appointment, call before the appointment to cancel. Schedule another appointment.  TEST  You will need to undress from the waist up. You will put on a hospital gown.   Your breast will be put on the mammogram machine, and it will press firmly on your breast with a piece of plastic called a compression paddle. This will make your breast flatter so that the machine can X-ray all parts of your breast.   Both breasts will be X-rayed. Each breast will be X-rayed from above and from the side. An X-ray might need to be taken again if the picture is not good enough.   The mammogram will last about 15 to 30 minutes.  AFTER THE TEST Finding out the results of your test Ask when your test results will be ready. Make sure you get your test results.  Document Released: 02/11/2009 Document Revised: 11/04/2011 Document  Reviewed: 02/11/2009 [employer] Patient Information 2012 [employer]  

## 2012-09-21 NOTE — Progress Notes (Signed)
[name] [birth year] [id number]        71 y.o.  G3P0013 new patient with several issues noted below.  Past medical history,surgical history, medications, allergies, family history and social history were all reviewed and documented in the EPIC chart. ROS:  Was performed and pertinent positives and negatives are included in the history.  Exam: Biomedical scientist Filed Vitals:   09/21/12 0945  BP: 132/84  Height: 5\' 3"  (1.6 m)  Weight: 141 lb (63.957 kg)   General appearance  Normal Skin grossly normal Head/Neck normal with no cervical or supraclavicular adenopathy thyroid normal Lungs  clear Cardiac RR, without RMG Abdominal  soft, nontender, without masses, organomegaly or hernia Breasts  examined lying and sitting without masses, retractions, discharge or axillary adenopathy. Pelvic  Ext/BUS/vagina  normal with atrophic changes  Cervix  normal with atrophic changes  Uterus  axial, normal size, shape and contour, midline and mobile nontender   Adnexa  Without masses or tenderness    Anus and perineum  normal   Rectovaginal  normal sphincter tone without palpated masses or tenderness.    Assessment/Plan:  71 y.o. [id number] new patient for follow up exam.   1. HRT.  Patient currently on Estrace 1 mg and Aygestin 2.5 mg daily. Has been on HRT since age 10. Has tried various routes to include transdermal and has found the Estrace/Aygestin works the best. She's tried weaning with return of symptoms to include fatigue. I reviewed HRT, risks benefits to include WHI study risks of stroke heart attack DVT as well as breast cancer. Patient is fully informed has done a lot of reading and wants to continue and I refilled her times a year. Transdermal versus oral first pass effect advantages also reviewed.  Had transient bleeding in 2009 to include evaluation with endometrial biopsy was negative. No bleeding since then. Patient knows need to report any bleeding or any other issues. 2. Urinary  incontinence. Mostly stress historically with coughing laughing sneezing. Occasional leakage at night with no symptoms. No urgency symptoms. Options for urology referral reviewed. Patient feels it is not that bothersome and prefers to monitor. Has had urinalysis checked at primary physician's office and declines today. 3. Mammography.  March 2013. Continue with annual mammography. SBE monthly reviewed. 4. Pap smear.  No Pap smear done today. Last Pap smear 2011. No history of abnormal Pap smears previously. The option to stop screening altogether as she is over the age of 39 versus less frequent screening reviewed. We'll readdress on an annual basis. 5. DEXA.  01/2010 normal. Increase calcium vitamin D reviewed. Plan repeat a 5 year interval. 6. Colonoscopy.  2005. Repeat a 10 year interval per their recommendation. 7. Health maintenance.  No lab work done today as it is done through her primary physician's office who follows her on a regular basis. Follow up one year, sooner as needed.    Dara Lords MD, 10:14 AM 09/21/2012

## 2012-09-25 ENCOUNTER — Telehealth: Payer: Self-pay | Admitting: *Deleted

## 2012-09-25 MED ORDER — ESTRADIOL 1 MG PO TABS: 1.0000 mg | ORAL_TABLET | ORAL | Status: DC

## 2012-09-25 MED ORDER — NORETHINDRONE ACETATE 5 MG PO TABS: 2.5000 mg | ORAL_TABLET | Freq: Every day | ORAL | Status: DC

## 2012-09-25 NOTE — Telephone Encounter (Signed)
Pt calling requesting 90 day supply of her estrace 1 mg & aygestin 0.5 mg. Rx will be sent.

## 2012-12-22 ENCOUNTER — Ambulatory Visit (INDEPENDENT_AMBULATORY_CARE_PROVIDER_SITE_OTHER): Payer: Medicare Other | Admitting: Gynecology

## 2012-12-22 ENCOUNTER — Encounter: Payer: Self-pay | Admitting: Gynecology

## 2012-12-22 DIAGNOSIS — N950 Postmenopausal bleeding: Secondary | ICD-10-CM

## 2012-12-22 NOTE — Progress Notes (Signed)
Patient presents on HRT is Estrace 1 mg and Aygestin 2.5 mg daily. Had an episode of spotting for about 2 weeks after Christmas and a return episode of bright red spotting this morning. No pain cramping or other symptoms. Did have an episode of postmenopausal bleeding in 2009 where another physician worked her up to include ultrasound biopsy and was negative by her report.  Exam was kim assistant Abdomen soft nontender without masses guarding rebound organomegaly. Pelvic external BUS vagina normal with atrophic changes. No evidence of bleeding. Cervix grossly normal no bleeding. Uterus anteverted generous in size firm nontender. Adnexa without masses or tenderness.  Assessment and plan: 1. Atrophic genital changes 2. Generous uterus not appreciated on last exam questionable small fibroids. 3. Postmenopausal bleeding on HRT.  Recommend baseline sonohysterogram assess for leiomyoma/endometrial echo possible endometrial biopsy. Patient agrees and we'll schedule in follow up.

## 2012-12-22 NOTE — Patient Instructions (Signed)
Follow up for ultrasound as scheduled 

## 2013-01-05 ENCOUNTER — Other Ambulatory Visit: Payer: Self-pay | Admitting: Gynecology

## 2013-01-05 ENCOUNTER — Ambulatory Visit (INDEPENDENT_AMBULATORY_CARE_PROVIDER_SITE_OTHER): Payer: Medicare Other | Admitting: Gynecology

## 2013-01-05 ENCOUNTER — Encounter: Payer: Self-pay | Admitting: Gynecology

## 2013-01-05 ENCOUNTER — Ambulatory Visit (INDEPENDENT_AMBULATORY_CARE_PROVIDER_SITE_OTHER): Payer: Medicare Other

## 2013-01-05 DIAGNOSIS — R1031 Right lower quadrant pain: Secondary | ICD-10-CM

## 2013-01-05 DIAGNOSIS — N950 Postmenopausal bleeding: Secondary | ICD-10-CM

## 2013-01-05 DIAGNOSIS — N838 Other noninflammatory disorders of ovary, fallopian tube and broad ligament: Secondary | ICD-10-CM | POA: Insufficient documentation

## 2013-01-05 DIAGNOSIS — R1903 Right lower quadrant abdominal swelling, mass and lump: Secondary | ICD-10-CM

## 2013-01-05 DIAGNOSIS — R971 Elevated cancer antigen 125 [CA 125]: Secondary | ICD-10-CM | POA: Insufficient documentation

## 2013-01-05 DIAGNOSIS — R1900 Intra-abdominal and pelvic swelling, mass and lump, unspecified site: Secondary | ICD-10-CM

## 2013-01-05 NOTE — Progress Notes (Signed)
Patient presents for sonohysterogram due to history of postmenopausal spotting on HRT. There was a question of leiomyoma as her uterus felt enlarged at her January exam where as in October her exam was normal.   Ultrasound shows uterus normal size and echotexture. One small myoma 6 x 6 mm noted. Endometrial echo 4.3 mm. Left ovary normal. Right adnexa with thick walled vascular cystic solid mass measuring 11 x 84 x 82 mm. No ascites or cul-de-sac fluid noted.   Sonohysterogram performed, sterile technique, easy catheter introduction, good distention with no abnormalities and an atrophic pattern. Endometrial sample taken with scant return. Patient tolerated well.  Assessment and plan: 1. Postmenopausal spotting on HRT. Endometrium atrophic in appearance. Will follow up for biopsy results which I expect will return atrophic. 2. 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 Not present on exam in October now palpable. Will check CA 125, baseline CT for metastatic evaluation and rule out other source of primary, gynecologic oncologist referral. Situation was realistically reviewed with the patient and the plan clearly reviewed. Her questions were answered and she knows that she will be having surgery for removal of this mass. The office will contact her within several days and she knows to call me if she has any questions or confusion about testing or results.

## 2013-01-05 NOTE — Patient Instructions (Signed)
Office will contact you to arrange the CT scan. Office will contact you to arrange the appointment with gynecologic oncologist

## 2013-01-08 ENCOUNTER — Telehealth: Payer: Self-pay | Admitting: *Deleted

## 2013-01-08 DIAGNOSIS — R1900 Intra-abdominal and pelvic swelling, mass and lump, unspecified site: Secondary | ICD-10-CM

## 2013-01-08 NOTE — Telephone Encounter (Signed)
Message copied by Aura Camps on Mon Jan 08, 2013  8:49 AM ------      Message from: Keenan Bachelor      Created: Fri Jan 05, 2013  1:39 PM                   ----- Message -----         From: Dara Lords, MD         Sent: 01/05/2013  12:35 PM           To: Keenan Bachelor            Patient needs:      #1  CT scan of the pelvis and abdomen with and without contrast reference right adnexa cystic/solid mass 11 x 8 cm            #2  Appointment with gynecologic oncologist at [facility] long reference 72 year old with 11 x 8 cm cystic/solid mass right adnexa CA 125 and CT results are pending ------

## 2013-01-08 NOTE — Telephone Encounter (Signed)
Informed pt husband with all the below note and her will tell patient, number left if questions.  CT is scheduled on 01/17/13 @ 10:30 am at [facility] radiology pt will need to have BUN & Creatine drawn order placed in system for lab, she will need to pick up oral contrast 1 days prior to CT scan.   appointment with oncologist at cancer center on Feb 20 th @ 3:00pm

## 2013-01-10 ENCOUNTER — Other Ambulatory Visit (HOSPITAL_COMMUNITY): Payer: Medicare Other

## 2013-01-16 ENCOUNTER — Encounter: Payer: Self-pay | Admitting: *Deleted

## 2013-01-16 ENCOUNTER — Telehealth: Payer: Self-pay

## 2013-01-16 ENCOUNTER — Other Ambulatory Visit: Payer: Self-pay | Admitting: Gynecology

## 2013-01-16 LAB — BUN: BUN: 11 mg/dL (ref 6–23)

## 2013-01-16 LAB — CREATININE, SERUM: Creat: 0.65 mg/dL (ref 0.50–1.10)

## 2013-01-16 NOTE — Telephone Encounter (Signed)
[employer] called. Approval provided for CT scan tomorrow.  Authorization [id number] expires on 03/02/13.  Patient was informed.

## 2013-01-16 NOTE — Telephone Encounter (Signed)
I notified patient that [facility] would not precert her CT scan today because per Ms. Massenburger at [facility] it did not meet criteria because [facility] wants her to have MRI instead.  I explained to patient Dr. Velvet Bathe does not feel that MRI is indicated.  The Clinicial reviewer, Ms. Judie Petit., is sending it through for physician review and they will let us know something within 2 business days.  Patient is okay with leaving it scheduled and waiting to see if we get approval.  I will call her as soon as I hear or at 10am in the morning (she has to drink contrast at 12:30p)  if I don't hear anything and we will cancel it and let her see gyn/onc on Thursday and let them order what they want her to have.

## 2013-01-16 NOTE — Progress Notes (Signed)
Patient ID: [name], female   DOB: [birth year], 72 y.o.   MRN: [id number] BUN AND CREATINE ORDER FAXED TO [facility].

## 2013-01-17 ENCOUNTER — Ambulatory Visit (HOSPITAL_COMMUNITY): Payer: Medicare Other

## 2013-01-17 ENCOUNTER — Ambulatory Visit (HOSPITAL_COMMUNITY)
Admission: RE | Admit: 2013-01-17 | Discharge: 2013-01-17 | Disposition: A | Discharge status: Home / Self Care | Payer: Medicare Other | Source: Ambulatory Visit | Attending: Gynecology | Admitting: Gynecology

## 2013-01-17 DIAGNOSIS — R1011 Right upper quadrant pain: Secondary | ICD-10-CM | POA: Insufficient documentation

## 2013-01-17 DIAGNOSIS — R1903 Right lower quadrant abdominal swelling, mass and lump: Secondary | ICD-10-CM | POA: Insufficient documentation

## 2013-01-17 DIAGNOSIS — R9389 Abnormal findings on diagnostic imaging of other specified body structures: Secondary | ICD-10-CM | POA: Insufficient documentation

## 2013-01-17 DIAGNOSIS — R1900 Intra-abdominal and pelvic swelling, mass and lump, unspecified site: Secondary | ICD-10-CM

## 2013-01-17 MED ORDER — IOHEXOL 300 MG/ML  SOLN
100.0000 mL | Freq: Once | INTRAMUSCULAR | Status: AC | PRN
  Administered 2013-01-17: 100 mL via INTRAVENOUS

## 2013-01-18 ENCOUNTER — Telehealth: Payer: Self-pay | Admitting: Gynecology

## 2013-01-18 ENCOUNTER — Ambulatory Visit: Payer: Medicare Other | Attending: Gynecologic Oncology | Admitting: Gynecologic Oncology

## 2013-01-18 ENCOUNTER — Encounter: Payer: Self-pay | Admitting: Gynecologic Oncology

## 2013-01-18 VITALS — BP 132/70 | HR 64 | Temp 98.3°F | Resp 16 | Ht 62.99 in | Wt 140.6 lb

## 2013-01-18 DIAGNOSIS — I10 Essential (primary) hypertension: Secondary | ICD-10-CM | POA: Insufficient documentation

## 2013-01-18 DIAGNOSIS — N838 Other noninflammatory disorders of ovary, fallopian tube and broad ligament: Secondary | ICD-10-CM

## 2013-01-18 DIAGNOSIS — N9489 Other specified conditions associated with female genital organs and menstrual cycle: Secondary | ICD-10-CM | POA: Insufficient documentation

## 2013-01-18 DIAGNOSIS — R971 Elevated cancer antigen 125 [CA 125]: Secondary | ICD-10-CM

## 2013-01-18 DIAGNOSIS — Z7982 Long term (current) use of aspirin: Secondary | ICD-10-CM | POA: Insufficient documentation

## 2013-01-18 DIAGNOSIS — E785 Hyperlipidemia, unspecified: Secondary | ICD-10-CM | POA: Insufficient documentation

## 2013-01-18 DIAGNOSIS — E039 Hypothyroidism, unspecified: Secondary | ICD-10-CM | POA: Insufficient documentation

## 2013-01-18 DIAGNOSIS — Z79899 Other long term (current) drug therapy: Secondary | ICD-10-CM | POA: Insufficient documentation

## 2013-01-18 DIAGNOSIS — N839 Noninflammatory disorder of ovary, fallopian tube and broad ligament, unspecified: Secondary | ICD-10-CM | POA: Insufficient documentation

## 2013-01-18 NOTE — Patient Instructions (Signed)
Recommend exploratory laparotomy total abdominal hysterectomy bilateral salpingo-oophorectomy with staging and debulking procedures as indicated based on the intraoperative frozen section.  Dr. De Blanch will be the surgeon and the procedure will be performed on 02/13/2013  If there is no improvement in her upper respiratory symptoms with use of azithromycin please followup with Dr. Timothy Lasso. Please see Dr. Bryan Lemma for recommendations regarding management of your Plavix for the underlying atrial fibrillation.  Thank you very much [name] for allowing me to provide care for you today.  I appreciate your confidence in choosing our Gynecologic Oncology team.  If you have any questions about your visit today please call our office and we will get back to you as soon as possible.  Maryclare Labrador. Kaylin Schellenberg MD., PhD Gynecologic Oncology

## 2013-01-18 NOTE — Telephone Encounter (Signed)
Called patient with the CT results which confirmed the cystic solid mass on her ovary but fortunately did not show overt evidence of spread. Her lymph nodes, omentum all looked normal and no evidence of ascites. She has an appointment to see Dr. Laurette Schimke this afternoon and will follow up with her.

## 2013-01-18 NOTE — Progress Notes (Signed)
Consult Note: Gyn-Onc  Consult was requested by Dr. Audie Box for the evaluation of [name] 72 y.o. female  CC:  Chief Complaint  Patient presents with  . Ovarian mass    Follow up    HPI: [name] is a 72 year old gravida 4 para 3 last moments appeared in 1991. She'll this uterine bleeding 3 weeks ago and underwent a sonohysterogram for evaluation. The findings were notable for a normal uterus with normal echotexture and a small 6 mm fibroid. The left ovary was within normal limits. The right adnexa was noted to be replaced with a thick walled vascular cystic solid mass measuring 11 x 8 4 x 82 mm. There is no evidence of ascites or cul-de-sac fluid. A CA 125 obtained in the same the returned to value of 178. The patient denies any weight changes or early satiety there is no dominant pain nausea vomiting no bloating no changes in bowel or bladder habits. She does note a significant URI for which she is on day one of the Z-Pak   Current Meds:  Outpatient Encounter Prescriptions as of 01/18/2013  Medication Sig Dispense Refill  . aspirin 81 MG tablet Take 81 mg by mouth daily.      . bisoprolol (ZEBETA) 5 MG tablet Take 5 mg by mouth 2 (two) times daily.      . clopidogrel (PLAVIX) 75 MG tablet Take 75 mg by mouth every morning.      Marland Kitchen estradiol (ESTRACE) 1 MG tablet Take 1 tablet (1 mg total) by mouth every morning. PT STATES SHE WANTS ESTRACE--NOT THE GENERIC  90 tablet  3  . fish oil-omega-3 fatty acids 1000 MG capsule Take 1 g by mouth every morning.      . Glucosamine 500 MG CAPS Take by mouth. 2 ONCE A DAY  ( 1000MG  DAILY)      . hydrALAZINE (APRESOLINE) 25 MG tablet Take 25 mg by mouth 2 (two) times daily.      . hydrochlorothiazide (MICROZIDE) 12.5 MG capsule Take 12.5 mg by mouth every morning.      Marland Kitchen levothyroxine (SYNTHROID, LEVOTHROID) 88 MCG tablet Take 88 mcg by mouth every morning. PT STATES SHE NEEDS THE SYNTHROID--DOES NOT WANT THE GENERIC      .  losartan (COZAAR) 50 MG tablet Take 50 mg by mouth every morning.      . Multiple Vitamin (MULITIVITAMIN WITH MINERALS) TABS Take 1 tablet by mouth every morning.      . norethindrone (AYGESTIN) 5 MG tablet Take 0.5 tablets (2.5 mg total) by mouth daily. TAKES ONLY 1/2 TABLET  EVER AM  90 tablet  3  . potassium chloride (K-DUR,KLOR-CON) 10 MEQ tablet Take 10 mEq by mouth 2 (two) times daily.      . propafenone (RYTHMOL) 225 MG tablet Take 225 mg by mouth 2 times daily at 12 noon and 4 pm.       No facility-administered encounter medications on file as of 01/18/2013.    Allergy:  Allergies  Allergen Reactions  . Lisinopril     LIP NUMBNESS    Social Hx:   History   Social History  . Marital Status: Married    Spouse Name: N/A    Number of Children: N/A  . Years of Education: N/A   Occupational History  . Not on file.   Social History Main Topics  . Smoking status: Never Smoker   . Smokeless tobacco: Never Used  . Alcohol Use: No  .  Drug Use: No  . Sexually Active: Not on file   Other Topics Concern  . Not on file   Social History Narrative  . No narrative on file    Past Surgical Hx:  Past Surgical History  Procedure Laterality Date  . Bilateral carpal tunnel repair    . Back surgery    . Dilation and curettage of uterus  1969  . Surgery for ruptured ovarian cyst  1969  . Rhinoplasty for fractured nose  1986  . Left knee arthroscopy   2011  . 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 Dysrhythmia     HX OF PAROXYSMAL ATRIAL FIB  . Hypertension   . Dyslipidemia   . Hypothyroidism   . Arthritis     OSTEOARTHRITIS   -- CONSTANT PAIN RIGHT HIP---AND PAIN LEFT KNEE--PT STATES SHE GETS INJECTIONS INTO HER KNEE  . Complication of anesthesia     BLOOD  PRESSURE DROPPED WITH NASAL SURGERY, ONE OF THE CARPAL TUNNEL REPAIRS AND DURING A COLONOSCOPY    Past Gynecological History: G3 P3113 menarche at the age of 72 with regular menses.  She's been on Estrace and norethindrone for approximately 20 years since menopause. She reports history of OCP use for approximately 1.5 years.  She denies any history of abnormal Pap test.  Reports a right ovarian cystectomy during one of her pregnancies.  Family Hx:  Family History  Problem Relation Age of Onset  . Hypertension Mother     Review of Systems:  Constitutional  Feels well  Cardiovascular  No chest pain, shortness of breath, or edema  Pulmonary  Reports a cough that's nonproductive denies wheezing , denies shortness of breath  Gastro Intestinal  No nausea, vomitting, or diarrhoea. No bright red blood per rectum, no abdominal pain, change in bowel movement, or constipation. No abdominal bloating or early satiety no weight loss Genito Urinary  No frequency, urgency, dysuria, denies any further vaginal bleeding  Musculo Skeletal  No myalgia, arthralgia, joint swelling or pain  Neurologic  No weakness, numbness, change in gait,  Psychology  No depression, anxiety, insomnia.   Vitals:  Blood pressure 132/70, pulse 64, temperature 98.3 F (36.8 C), resp. rate 16, height 5' 2.99" (1.6 m), weight 140 lb 9.6 oz (63.776 kg).  Physical Exam: WD in NAD Neck  Supple NROM, without any enlargements.  Lymph Node Survey No cervical supraclavicular or inguinal adenopathy Cardiovascular  Pulse irregularly, irregular Lungs  Clear to auscultation bilateraly, without wheezes  Skin  No rash/lesions/breakdown  Psychiatry  Alert and oriented to person, place, and time  Abdomen  Normoactive bowel sounds, abdomen soft, non-tender and obese. Back No CVA tenderness Genito Urinary  Vulva/vagina: Normal external female genitalia.  No lesions. No discharge or bleeding.  Bladder/urethra:  No lesions or  masses  Vagina: Atrophic without any lesions discharge or bleeding  Cervix: Normal appearing, no lesions.  Uterus: Small, mobile, no parametrial involvement or nodularity.  Adnexa: Smooth-walled soft right pelvic mass  Rectal  Good tone,  no cul de sac nodularity. Right-sided pelvic mass is palpable Extremities  No bilateral cyanosis, clubbing or edema.   Assessment/Plan:  [name]  is a 72 y.o.  year old with a right solid cystic adnexal mass and elevated CA 125.  At this time she is a significant URI and is receiving azithromycin.  The recommendation was for exploratory laparotomy total abdominal hysterectomy bilateral salpingo-oophorectomy with staging and debulking procedures as indicated based on the intraoperative frozen section. The risks of procedure discussed with the patient and her husband were that of infection bleeding damage to surrounding structures prolonged hospitalization and reoperation.  They are aware that Dr. De Blanch will be the surgeon the procedure will be performed on 02/13/2013  I've advised her that if there is no improvement in her upper respiratory symptoms with use of azithromycin that she should followup with Dr. Timothy Lasso. I've also asked that she be seen by Dr. Bryan Lemma for recommendations regarding management of her Plavix for the underlying atrial fibrillation.  Laurette Schimke, MD, PhD 01/18/2013, 4:38 PM

## 2013-01-27 DIAGNOSIS — C569 Malignant neoplasm of unspecified ovary: Secondary | ICD-10-CM

## 2013-01-27 HISTORY — PX: OTHER SURGICAL HISTORY: SHX169

## 2013-01-27 HISTORY — DX: Malignant neoplasm of unspecified ovary: C56.9

## 2013-01-31 ENCOUNTER — Encounter (HOSPITAL_COMMUNITY): Payer: Self-pay | Admitting: Pharmacy Technician

## 2013-02-08 ENCOUNTER — Other Ambulatory Visit (HOSPITAL_COMMUNITY): Payer: Self-pay | Admitting: Gynecology

## 2013-02-08 ENCOUNTER — Telehealth: Payer: Self-pay | Admitting: *Deleted

## 2013-02-08 NOTE — Patient Instructions (Addendum)
20 MALAYA CAGLEY  02/08/2013   Your procedure is scheduled on: 02-13-2013  Report to [facility] at 915AM.  Call this number if you have problems the morning of surgery [phone]   Remember:FOLLOW ALL BOWEL PREP INSTRUCTIONS FROM DR Stanford Breed, NO 81MG  ASPIRIN  Monday OR Tuesday PER Telford Nab, RN    Do not eat food after midnight Sunday night              Clear liquids all day  Monday 02-12-2013   Take these medicines the morning of surgery with A SIP OF WATER: bisprolol, synthroid, propaferone, estrace                                SEE  PREPARING FOR SURGERY SHEET   Do not wear jewelry, make-up or nail polish.  Do not wear lotions, powders, or perfumes. You may wear deodorant.   Men may shave face and neck.  Do not bring valuables to the hospital.  Contacts, dentures or bridgework may not be worn into surgery.  Leave suitcase in the car. After surgery it may be brought to your room.  For patients admitted to the hospital, checkout time is 11:00 AM the day of discharge.   Patients discharged the day of surgery will not be allowed to drive home.  Name and phone number of your driver:  Special Instructions: N/A   Please read over the following fact sheets that you were given: MRSA Information.  Call Cain Sieve RN pre op nurse if needed [phone]    FAILURE TO FOLLOW THESE INSTRUCTIONS MAY RESULT IN THE CANCELLATION OF YOUR SURGERY. PATIENT SIGNATURE___________________________________________

## 2013-02-08 NOTE — Telephone Encounter (Signed)
Pt informed with Bx result on 01/05/13 from [facility].

## 2013-02-08 NOTE — Progress Notes (Signed)
ekg 01-01-2013 [facility] on chart lov note dr harding cardiology 01-01-2013 on chart Echo 05-15-2009 [facility] on chart

## 2013-02-09 ENCOUNTER — Encounter (HOSPITAL_COMMUNITY)
Admission: RE | Admit: 2013-02-09 | Discharge: 2013-02-09 | Disposition: A | Discharge status: Home / Self Care | Payer: Medicare Other | Source: Ambulatory Visit | Attending: Obstetrics & Gynecology | Admitting: Obstetrics & Gynecology

## 2013-02-09 ENCOUNTER — Encounter (HOSPITAL_COMMUNITY): Payer: Self-pay

## 2013-02-09 LAB — CBC WITH DIFFERENTIAL/PLATELET
Basophils Absolute: 0 10*3/uL (ref 0.0–0.1)
Lymphocytes Relative: 24 % (ref 12–46)
Lymphs Abs: 1.5 10*3/uL (ref 0.7–4.0)
Neutro Abs: 3.9 10*3/uL (ref 1.7–7.7)
Platelets: 217 10*3/uL (ref 150–400)
RBC: 4.09 MIL/uL (ref 3.87–5.11)
RDW: 12.1 % (ref 11.5–15.5)
WBC: 6.1 10*3/uL (ref 4.0–10.5)

## 2013-02-09 LAB — COMPREHENSIVE METABOLIC PANEL
ALT: 17 U/L (ref 0–35)
AST: 18 U/L (ref 0–37)
Alkaline Phosphatase: 76 U/L (ref 39–117)
CO2: 32 mEq/L (ref 19–32)
Chloride: 98 mEq/L (ref 96–112)
GFR calc non Af Amer: >90 mL/min (ref >90)
Glucose, Bld: 86 mg/dL (ref 70–99)
Potassium: 4 mEq/L (ref 3.5–5.1)
Sodium: 135 mEq/L (ref 135–145)
Total Bilirubin: 0.5 mg/dL (ref 0.3–1.2)

## 2013-02-09 LAB — CA 125: CA 125: 209.3 U/mL — ABNORMAL HIGH (ref 0.0–30.2)

## 2013-02-09 NOTE — Progress Notes (Signed)
Chest 2 view xray 01-29-2013 [facility] on chart

## 2013-02-13 ENCOUNTER — Encounter: Payer: Self-pay | Admitting: Gynecology

## 2013-02-13 ENCOUNTER — Inpatient Hospital Stay (HOSPITAL_COMMUNITY)
Admission: RE | Admit: 2013-02-13 | Discharge: 2013-02-20 | DRG: 737 | Disposition: A | Discharge status: Home Health | Payer: Medicare Other | Source: Ambulatory Visit | Attending: Obstetrics & Gynecology | Admitting: Obstetrics & Gynecology

## 2013-02-13 ENCOUNTER — Inpatient Hospital Stay (HOSPITAL_COMMUNITY): Payer: Medicare Other | Admitting: Anesthesiology

## 2013-02-13 ENCOUNTER — Encounter (HOSPITAL_COMMUNITY): Payer: Self-pay | Admitting: *Deleted

## 2013-02-13 ENCOUNTER — Encounter (HOSPITAL_COMMUNITY): Payer: Self-pay | Admitting: Anesthesiology

## 2013-02-13 ENCOUNTER — Encounter (HOSPITAL_COMMUNITY)
Admission: RE | Disposition: A | Discharge status: Home Health | Payer: Self-pay | Source: Ambulatory Visit | Attending: Obstetrics & Gynecology

## 2013-02-13 DIAGNOSIS — I4891 Unspecified atrial fibrillation: Secondary | ICD-10-CM | POA: Diagnosis present

## 2013-02-13 DIAGNOSIS — I10 Essential (primary) hypertension: Secondary | ICD-10-CM | POA: Diagnosis present

## 2013-02-13 DIAGNOSIS — Z01812 Encounter for preprocedural laboratory examination: Secondary | ICD-10-CM

## 2013-02-13 DIAGNOSIS — C569 Malignant neoplasm of unspecified ovary: Principal | ICD-10-CM | POA: Diagnosis present

## 2013-02-13 DIAGNOSIS — E039 Hypothyroidism, unspecified: Secondary | ICD-10-CM | POA: Diagnosis present

## 2013-02-13 DIAGNOSIS — C786 Secondary malignant neoplasm of retroperitoneum and peritoneum: Secondary | ICD-10-CM | POA: Diagnosis present

## 2013-02-13 DIAGNOSIS — N838 Other noninflammatory disorders of ovary, fallopian tube and broad ligament: Secondary | ICD-10-CM | POA: Diagnosis present

## 2013-02-13 DIAGNOSIS — N135 Crossing vessel and stricture of ureter without hydronephrosis: Secondary | ICD-10-CM | POA: Diagnosis present

## 2013-02-13 DIAGNOSIS — C785 Secondary malignant neoplasm of large intestine and rectum: Secondary | ICD-10-CM | POA: Diagnosis present

## 2013-02-13 HISTORY — PX: LYMPHADENECTOMY: SHX5960

## 2013-02-13 HISTORY — PX: OMENTECTOMY: SHX5985

## 2013-02-13 HISTORY — PX: LAPAROTOMY: SHX154

## 2013-02-13 LAB — TYPE AND SCREEN

## 2013-02-13 SURGERY — LAPAROTOMY, EXPLORATORY
Anesthesia: General | Site: Abdomen | Laterality: Right | Wound class: Clean Contaminated

## 2013-02-13 MED ORDER — HYDROMORPHONE HCL PF 1 MG/ML IJ SOLN
0.2500 mg | INTRAMUSCULAR | Status: DC | PRN
  Administered 2013-02-13: 0.5 mg via INTRAVENOUS
  Administered 2013-02-13 (×2): 0.25 mg via INTRAVENOUS

## 2013-02-13 MED ORDER — INDIGOTINDISULFONATE SODIUM 8 MG/ML IJ SOLN
INTRAMUSCULAR | Status: AC
  Filled 2013-02-13: qty 5

## 2013-02-13 MED ORDER — KCL IN DEXTROSE-NACL 20-5-0.45 MEQ/L-%-% IV SOLN
INTRAVENOUS | Status: AC
  Filled 2013-02-13: qty 1000

## 2013-02-13 MED ORDER — KCL IN DEXTROSE-NACL 20-5-0.45 MEQ/L-%-% IV SOLN
INTRAVENOUS | Status: DC
  Administered 2013-02-13 – 2013-02-14 (×3): via INTRAVENOUS
  Filled 2013-02-13 (×3): qty 1000

## 2013-02-13 MED ORDER — NEOSTIGMINE METHYLSULFATE 1 MG/ML IJ SOLN
INTRAMUSCULAR | Status: DC | PRN
  Administered 2013-02-13: 3 mg via INTRAVENOUS

## 2013-02-13 MED ORDER — NALOXONE HCL 0.4 MG/ML IJ SOLN: 0.4000 mg | INTRAMUSCULAR | Status: DC | PRN

## 2013-02-13 MED ORDER — LACTATED RINGERS IV SOLN: INTRAVENOUS | Status: DC

## 2013-02-13 MED ORDER — ONDANSETRON HCL 4 MG/2ML IJ SOLN
4.0000 mg | Freq: Four times a day (QID) | INTRAMUSCULAR | Status: DC | PRN
  Administered 2013-02-13 – 2013-02-15 (×2): 4 mg via INTRAVENOUS
  Filled 2013-02-13 (×2): qty 2

## 2013-02-13 MED ORDER — LIDOCAINE HCL (CARDIAC) 20 MG/ML IV SOLN
INTRAVENOUS | Status: DC | PRN
  Administered 2013-02-13: 50 mg via INTRAVENOUS

## 2013-02-13 MED ORDER — PROMETHAZINE HCL 25 MG/ML IJ SOLN
INTRAMUSCULAR | Status: AC
  Filled 2013-02-13: qty 1

## 2013-02-13 MED ORDER — ROCURONIUM BROMIDE 100 MG/10ML IV SOLN
INTRAVENOUS | Status: DC | PRN
  Administered 2013-02-13: 10 mg via INTRAVENOUS
  Administered 2013-02-13: 7.5 mg via INTRAVENOUS
  Administered 2013-02-13: 40 mg via INTRAVENOUS
  Administered 2013-02-13: 10 mg via INTRAVENOUS
  Administered 2013-02-13: 2.5 mg via INTRAVENOUS

## 2013-02-13 MED ORDER — PROPOFOL 10 MG/ML IV BOLUS
INTRAVENOUS | Status: DC | PRN
  Administered 2013-02-13: 150 mg via INTRAVENOUS

## 2013-02-13 MED ORDER — LACTATED RINGERS IV SOLN
INTRAVENOUS | Status: DC | PRN
  Administered 2013-02-13: 13:00:00

## 2013-02-13 MED ORDER — HYDROMORPHONE HCL PF 1 MG/ML IJ SOLN
INTRAMUSCULAR | Status: AC
  Filled 2013-02-13: qty 1

## 2013-02-13 MED ORDER — ENOXAPARIN SODIUM 40 MG/0.4ML ~~LOC~~ SOLN
40.0000 mg | SUBCUTANEOUS | Status: DC
  Administered 2013-02-14 – 2013-02-20 (×7): 40 mg via SUBCUTANEOUS
  Filled 2013-02-13 (×7): qty 0.4

## 2013-02-13 MED ORDER — SODIUM CHLORIDE 0.9 % IJ SOLN
INTRAMUSCULAR | Status: DC | PRN
  Administered 2013-02-13: 12:00:00

## 2013-02-13 MED ORDER — PROPAFENONE HCL 225 MG PO TABS
225.0000 mg | ORAL_TABLET | Freq: Two times a day (BID) | ORAL | Status: DC
  Administered 2013-02-13 – 2013-02-20 (×14): 225 mg via ORAL
  Filled 2013-02-13 (×15): qty 1

## 2013-02-13 MED ORDER — ZOLPIDEM TARTRATE 5 MG PO TABS: 5.0000 mg | ORAL_TABLET | Freq: Every evening | ORAL | Status: DC | PRN

## 2013-02-13 MED ORDER — HYDRALAZINE HCL 25 MG PO TABS
25.0000 mg | ORAL_TABLET | Freq: Two times a day (BID) | ORAL | Status: DC
  Administered 2013-02-13 – 2013-02-20 (×14): 25 mg via ORAL
  Filled 2013-02-13 (×15): qty 1

## 2013-02-13 MED ORDER — PROMETHAZINE HCL 25 MG/ML IJ SOLN
12.5000 mg | INTRAMUSCULAR | Status: DC | PRN
  Administered 2013-02-13: 12.5 mg via INTRAVENOUS

## 2013-02-13 MED ORDER — SODIUM CHLORIDE 0.9 % IV SOLN
500.0000 mg | Freq: Once | INTRAVENOUS | Status: AC
  Administered 2013-02-13: 1 g via INTRAVENOUS

## 2013-02-13 MED ORDER — BUPIVACAINE LIPOSOME 1.3 % IJ SUSP
20.0000 mL | Freq: Once | INTRAMUSCULAR | Status: DC
  Filled 2013-02-13: qty 20

## 2013-02-13 MED ORDER — ENOXAPARIN SODIUM 40 MG/0.4ML ~~LOC~~ SOLN
40.0000 mg | SUBCUTANEOUS | Status: AC
  Administered 2013-02-13: 40 mg via SUBCUTANEOUS
  Filled 2013-02-13: qty 0.4

## 2013-02-13 MED ORDER — DIPHENHYDRAMINE HCL 12.5 MG/5ML PO ELIX: 12.5000 mg | ORAL_SOLUTION | Freq: Four times a day (QID) | ORAL | Status: DC | PRN

## 2013-02-13 MED ORDER — GLYCOPYRROLATE 0.2 MG/ML IJ SOLN
INTRAMUSCULAR | Status: DC | PRN
  Administered 2013-02-13: 0.4 mg via INTRAVENOUS

## 2013-02-13 MED ORDER — SODIUM CHLORIDE 0.9 % IV SOLN
INTRAVENOUS | Status: AC
  Filled 2013-02-13: qty 1

## 2013-02-13 MED ORDER — CEFAZOLIN SODIUM-DEXTROSE 2-3 GM-% IV SOLR
2.0000 g | INTRAVENOUS | Status: AC
  Administered 2013-02-13: 2 g via INTRAVENOUS

## 2013-02-13 MED ORDER — ONDANSETRON HCL 4 MG/2ML IJ SOLN
INTRAMUSCULAR | Status: DC | PRN
  Administered 2013-02-13: 2 mg via INTRAVENOUS
  Administered 2013-02-13: 1 mg via INTRAVENOUS

## 2013-02-13 MED ORDER — BISOPROLOL FUMARATE 5 MG PO TABS
5.0000 mg | ORAL_TABLET | Freq: Two times a day (BID) | ORAL | Status: DC
  Administered 2013-02-13 – 2013-02-20 (×14): 5 mg via ORAL
  Filled 2013-02-13 (×15): qty 1

## 2013-02-13 MED ORDER — ONDANSETRON HCL 4 MG PO TABS: 4.0000 mg | ORAL_TABLET | Freq: Four times a day (QID) | ORAL | Status: DC | PRN

## 2013-02-13 MED ORDER — 0.9 % SODIUM CHLORIDE (POUR BTL) OPTIME
TOPICAL | Status: DC | PRN
  Administered 2013-02-13: 2000 mL

## 2013-02-13 MED ORDER — ACETAMINOPHEN 10 MG/ML IV SOLN
INTRAVENOUS | Status: DC | PRN
  Administered 2013-02-13: 1000 mg via INTRAVENOUS

## 2013-02-13 MED ORDER — LACTATED RINGERS IV SOLN
INTRAVENOUS | Status: DC
  Administered 2013-02-13 (×2): via INTRAVENOUS
  Administered 2013-02-13: 1000 mL via INTRAVENOUS

## 2013-02-13 MED ORDER — INDIGOTINDISULFONATE SODIUM 8 MG/ML IJ SOLN
INTRAMUSCULAR | Status: DC | PRN
  Administered 2013-02-13: 40 mg via INTRAVENOUS

## 2013-02-13 MED ORDER — EPHEDRINE SULFATE 50 MG/ML IJ SOLN
INTRAMUSCULAR | Status: DC | PRN
  Administered 2013-02-13 (×3): 5 mg via INTRAVENOUS

## 2013-02-13 MED ORDER — HYDROMORPHONE 0.3 MG/ML IV SOLN
INTRAVENOUS | Status: AC
  Filled 2013-02-13: qty 25

## 2013-02-13 MED ORDER — ACETAMINOPHEN 10 MG/ML IV SOLN
1000.0000 mg | Freq: Four times a day (QID) | INTRAVENOUS | Status: AC
  Administered 2013-02-13 – 2013-02-14 (×3): 1000 mg via INTRAVENOUS
  Filled 2013-02-13 (×4): qty 100

## 2013-02-13 MED ORDER — HYDROMORPHONE 0.3 MG/ML IV SOLN
INTRAVENOUS | Status: DC
  Administered 2013-02-13: 16:00:00 via INTRAVENOUS
  Administered 2013-02-13 – 2013-02-14 (×2): 0.2 mg via INTRAVENOUS
  Administered 2013-02-14: 0.3999 mg via INTRAVENOUS
  Administered 2013-02-15 (×2): 0.2 mg via INTRAVENOUS
  Administered 2013-02-16 (×2): 0.4 mg via INTRAVENOUS
  Administered 2013-02-16: 0.2 mg via INTRAVENOUS
  Administered 2013-02-16: 0.199 mg via INTRAVENOUS

## 2013-02-13 MED ORDER — DIPHENHYDRAMINE HCL 50 MG/ML IJ SOLN: 12.5000 mg | Freq: Four times a day (QID) | INTRAMUSCULAR | Status: DC | PRN

## 2013-02-13 MED ORDER — HEPARIN SODIUM (PORCINE) 1000 UNIT/ML IJ SOLN
INTRAMUSCULAR | Status: AC
  Filled 2013-02-13: qty 1

## 2013-02-13 MED ORDER — SODIUM CHLORIDE 0.9 % IJ SOLN: 9.0000 mL | INTRAMUSCULAR | Status: DC | PRN

## 2013-02-13 MED ORDER — FENTANYL CITRATE 0.05 MG/ML IJ SOLN
INTRAMUSCULAR | Status: DC | PRN
  Administered 2013-02-13 (×2): 25 ug via INTRAVENOUS
  Administered 2013-02-13: 50 ug via INTRAVENOUS
  Administered 2013-02-13: 100 ug via INTRAVENOUS
  Administered 2013-02-13 (×2): 25 ug via INTRAVENOUS

## 2013-02-13 SURGICAL SUPPLY — 45 items
ATTRACTOMAT 16X20 MAGNETIC DRP (DRAPES) ×8 IMPLANT
BAG URINE DRAINAGE (UROLOGICAL SUPPLIES) IMPLANT
CANISTER SUCTION 2500CC (MISCELLANEOUS) ×4 IMPLANT
CHLORAPREP W/TINT 26ML (MISCELLANEOUS) ×4 IMPLANT
CLIP TI MEDIUM LARGE 6 (CLIP) ×8 IMPLANT
CLOTH BEACON ORANGE TIMEOUT ST (SAFETY) ×4 IMPLANT
COVER SURGICAL LIGHT HANDLE (MISCELLANEOUS) ×4 IMPLANT
DRAIN CHANNEL 10F 3/8 F FF (DRAIN) ×4 IMPLANT
DRAPE UTILITY XL STRL (DRAPES) ×4 IMPLANT
DRAPE WARM FLUID 44X44 (DRAPE) ×4 IMPLANT
DRSG COVADERM 4X8 (GAUZE/BANDAGES/DRESSINGS) ×4 IMPLANT
ELECT BLADE 6.5 EXT (BLADE) ×4 IMPLANT
ELECT REM PT RETURN 9FT ADLT (ELECTROSURGICAL) ×4
ELECTRODE REM PT RTRN 9FT ADLT (ELECTROSURGICAL) ×3 IMPLANT
EVACUATOR SILICONE 100CC (DRAIN) ×4 IMPLANT
GAUZE SPONGE 4X4 16PLY XRAY LF (GAUZE/BANDAGES/DRESSINGS) ×4 IMPLANT
GLOVE BIOGEL M STRL SZ7.5 (GLOVE) ×16 IMPLANT
GOWN PREVENTION PLUS LG XLONG (DISPOSABLE) IMPLANT
GOWN STRL NON-REIN LRG LVL3 (GOWN DISPOSABLE) ×16 IMPLANT
GOWN STRL REIN XL XLG (GOWN DISPOSABLE) ×20 IMPLANT
LIGASURE IMPACT 36 18CM CVD LR (INSTRUMENTS) ×4 IMPLANT
NS IRRIG 1000ML POUR BTL (IV SOLUTION) IMPLANT
PACK ABDOMINAL WL (CUSTOM PROCEDURE TRAY) ×4 IMPLANT
SHEET LAVH (DRAPES) ×4 IMPLANT
SPONGE LAP 18X18 X RAY DECT (DISPOSABLE) ×8 IMPLANT
STAPLER SKIN PROX WIDE 3.9 (STAPLE) ×4 IMPLANT
SUT ETHILON 1 LR 30 (SUTURE) IMPLANT
SUT PDS AB 1 CTXB1 36 (SUTURE) ×8 IMPLANT
SUT SILK 2 0 (SUTURE) ×1
SUT SILK 2 0 30  PSL (SUTURE) ×1
SUT SILK 2 0 30 PSL (SUTURE) ×3 IMPLANT
SUT SILK 2-0 18XBRD TIE 12 (SUTURE) ×3 IMPLANT
SUT SILK 3 0 SH CR/8 (SUTURE) ×4 IMPLANT
SUT VIC AB 0 CT1 36 (SUTURE) ×16 IMPLANT
SUT VIC AB 2-0 CT2 27 (SUTURE) ×28 IMPLANT
SUT VIC AB 2-0 SH 27 (SUTURE) ×15
SUT VIC AB 2-0 SH 27X BRD (SUTURE) ×45 IMPLANT
SUT VIC AB 3-0 CTX 36 (SUTURE) IMPLANT
SUT VICRYL 2 0 18  UND BR (SUTURE) ×1
SUT VICRYL 2 0 18 UND BR (SUTURE) ×3 IMPLANT
SYR 20CC LL (SYRINGE) ×4 IMPLANT
TOWEL OR 17X26 10 PK STRL BLUE (TOWEL DISPOSABLE) ×4 IMPLANT
TOWEL OR NON WOVEN STRL DISP B (DISPOSABLE) ×8 IMPLANT
TRAY FOLEY CATH 14FRSI W/METER (CATHETERS) ×4 IMPLANT
WATER STERILE IRR 1500ML POUR (IV SOLUTION) IMPLANT

## 2013-02-13 NOTE — Anesthesia Preprocedure Evaluation (Signed)
Anesthesia Evaluation  Patient identified by MRN, date of birth, ID band Patient awake    Reviewed: Allergy & Precautions, H&P , NPO status , Patient's Chart, lab work & pertinent test results, reviewed documented beta blocker date and time   Airway Mallampati: II TM Distance: >3 FB Neck ROM: full    Dental  (+) Caps and Dental Advisory Given All front upper are capped:   Pulmonary neg pulmonary ROS,  breath sounds clear to auscultation  Pulmonary exam normal       Cardiovascular Exercise Tolerance: Good hypertension, Pt. on home beta blockers and Pt. on medications + dysrhythmias Atrial Fibrillation Rhythm:regular Rate:Normal     Neuro/Psych negative neurological ROS  negative psych ROS   GI/Hepatic negative GI ROS, Neg liver ROS,   Endo/Other  negative endocrine ROSHypothyroidism   Renal/GU negative Renal ROS  negative genitourinary   Musculoskeletal   Abdominal   Peds  Hematology negative hematology ROS (+)   Anesthesia Other Findings   Reproductive/Obstetrics negative OB ROS                           Anesthesia Physical Anesthesia Plan  ASA: III  Anesthesia Plan: General   Post-op Pain Management:    Induction: Intravenous  Airway Management Planned: Oral ETT  Additional Equipment:   Intra-op Plan:   Post-operative Plan: Extubation in OR  Informed Consent: I have reviewed the patients History and Physical, chart, labs and discussed the procedure including the risks, benefits and alternatives for the proposed anesthesia with the patient or authorized representative who has indicated his/her understanding and acceptance.   Dental Advisory Given  Plan Discussed with: CRNA and Surgeon  Anesthesia Plan Comments:         Anesthesia Quick Evaluation

## 2013-02-13 NOTE — Anesthesia Postprocedure Evaluation (Signed)
  Anesthesia Post-op Note  Patient: [name]  Procedure(s) Performed: Procedure(s) (LRB): EXPLORATORY LAPAROTOMY TOTAL ABDOMINAL HYSTERECTOMY BILATERAL SALPINGO-OOPHORECTOMY, Partial Rectal Resection with Reanastamosis (Bilateral) OMENTECTOMY PEVLIC  LYMPHADENECTOMY, DEBULKING right pelvic tumor nodules (Right)  Patient Location: PACU  Anesthesia Type: General  Level of Consciousness: awake and alert   Airway and Oxygen Therapy: Patient Spontanous Breathing  Post-op Pain: mild  Post-op Assessment: Post-op Vital signs reviewed, Patient's Cardiovascular Status Stable, Respiratory Function Stable, Patent Airway and No signs of Nausea or vomiting  Last Vitals:  Filed Vitals:   02/13/13 1615  BP: 102/65  Pulse: 51  Temp: 36.6 C  Resp: 11    Post-op Vital Signs: stable   Complications: No apparent anesthesia complications

## 2013-02-13 NOTE — Progress Notes (Signed)
Pt concerned about not receiving medication (rhythmol) before bedtime. Pt states she has to have it or she will go into A-fib. No current order to administer medication. Paged MD on call and informed of pt's request and current Diet (NPO). Orders received to administer medication per dosage listed on home medication list under orders. Will continue to monitor pt.

## 2013-02-13 NOTE — Interval H&P Note (Signed)
History and Physical Interval Note:  02/13/2013 11:43 AM  [name]  has presented today for surgery, with the diagnosis of PELVIC MASS   The various methods of treatment have been discussed with the patient and family. After consideration of risks, benefits and other options for treatment, the patient has consented to  Procedure(s): EXPLORATORY LAPAROTOMY TOTAL ABDOMINAL HYSTERECTOMY BILATERAL SALPINGO-OOPHORECTOMY POSSIBLE STAGING  (Bilateral) as a surgical intervention .  The patient's history has been reviewed, patient examined, no change in status, stable for surgery.  I have reviewed the patient's chart and labs.  Questions were answered to the patient's satisfaction.     CLARKE-PEARSON,Andrej Spagnoli L

## 2013-02-13 NOTE — Transfer of Care (Signed)
Immediate Anesthesia Transfer of Care Note  Patient: [name]  Procedure(s) Performed: Procedure(s): EXPLORATORY LAPAROTOMY TOTAL ABDOMINAL HYSTERECTOMY BILATERAL SALPINGO-OOPHORECTOMY POSSIBLE STAGING  (Bilateral)  Patient Location: PACU  Anesthesia Type:General  Level of Consciousness: awake, sedated and patient cooperative  Airway & Oxygen Therapy: Patient Spontanous Breathing and Patient connected to face mask oxygen  Post-op Assessment: Report given to PACU RN and Post -op Vital signs reviewed and stable  Post vital signs: stable  Complications: No apparent anesthesia complications

## 2013-02-13 NOTE — Op Note (Signed)
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 and Telford Nab RN ANESTHESIA: Gen. with oral tracheal tube ESTIMATED BLOOD LOSS: 700 mL  SURGICAL FINDINGS: At the time of exploratory laparotomy the patient had a small amount of pelvic ascites. The right ovary was replaced by approximately 9 cm solid and cystic mass which was densely adherent to the pelvic sidewall causing retroperitoneal fibrosis. In addition the left ovary was densely adherent to the left pelvic sidewall. There was a mass just beneath the left ovary that involved the rectal muscularis. In addition there were tumor implants in the posterior cul-de-sac.  There was an enlarged right pelvic lymph node.  There were no enlarged periaortic or left pelvic lymph nodes. Exploration of the upper abdomen revealed normal liver capsule and diaphragm. The omentum appeared normal. There is no other evidence of metastatic disease outside the pelvis. At the completion surgical procedure there was no residual gross disease. (Optimal debulking)  PROCEDURE: The patient was brought to the operating room and after satisfactory attainment of general anesthesia was placed in a modified lithotomy position in [place] stirrups. The anterior bowel wall perineum and vagina were prepped, Foley catheter was inserted, the patient was draped. A time-out was  taken  The abdomen was entered through a vertical incision. Peritoneal washings were taken and sent to cytopathology.   Bookwalter retractor was assembled and  the small bowel and sigmoid colon were elevated out of the pelvis thus exposing the uterus. The uterus was grasped with two long Kelly clamps.  The right round ligament was divided the right retroperitoneal space was opened. The iliac vessels and ureter were identified. The ureter was densely adherent to the right ovarian tumor requiring ureteral lysis to mobilize the ureter laterally. In addition there were some enlarged lymph nodes on the right side of the pelvis. The tumor was densely adherent to the left pelvic sidewall. The ovarian vessels are isolated clamped cut free tied and suture-ligated. Gradually dissected the peritoneum on the pelvic sidewall and mobilize the tumor medially. The utero-ovarian ligament and fallopian tube were then crossclamped at the uterine cornu and divided. Pelvic masses handed off the operative field and submitted to frozen section which revealed this to be a poorly differentiated carcinoma.  Similar procedures performed left side the pelvis. The left ovary was densely adherent to the left pelvic sidewall. In addition there was a mass beneath the ovary that was densely adherent to the rectum. The bladder flap was advanced to sharp and blunt dissection. The right ovarian ligament and fallopian tube were crossclamped and divided thus leaving the ovary on the pelvic sidewall temporarily. The Uterine vessels were skeletonized then clamped cut and suture ligated. Vaginal angles were encountered, crossclamped and the vagina transected from its connection to the cervix. The uterus and cervix were handed off the operative field. The vaginal angles were transfixed with 0 Vicryl the central portion of vagina closed with interrupted figure-of-eight sutures of 0 Vicryl. The pelvis was irrigated and hemostasis was ascertained.  The  nodularity in the posterior cul-de-sac was resected  sharply. We then addressed the mass adherent to the rectum. The retroperitoneal spaces further opened and ureteral lysis was performed mobilizing the left ureter. The mass was densely adherent to the rectum in order remove it a segment of the anterior rectum was excised. The rectum was then closed with interrupted sutures of 2-0 Vicryl and imbricating sutures of 3-0 silk. Proctoscopy was performed and saline placed in the pelvis proctoscope insufflated the rectum there is no evidence of air leak.  Right pelvic lymphadenectomy was then performed dissecting the enlarged lymph nodes on the external iliac artery and vein. We also removed lymph nodes from the obturator fossa with care taken to avoid injury to the obturator nerve.   Attention was turned to the upper abdomen. The Bookwalter retractor was repositioned and a transverse colon brought into the operative field. An infracolic omentectomy was then performed using the LigaSure.  The pelvis was reevaluated and found to be hemostatic. A 10 mm flat Jackson-Pratt drain was placed in the pelvic floor and exited through a stab incision in the left lower quadrant. Drain was sutured to skin with 2-0 silk. The pelvis was finally reinspected and found to be hemostatic. Packs and retractors were removed   The anterior abdominal wall was closed in layers the first being a running mass closure using #1 PDS. Subcutaneous tissue was irrigated and hemostasis achieved with cautery. . Skin was closed with skin staples. A dressing was applied.  Patient was awakened from anesthesia and taken to the recovery room in satisfactory condition. Sponge needle and instrument counts correct times two.   De Blanch, M.D

## 2013-02-13 NOTE — H&P (View-Only) (Signed)
Consult Note: Gyn-Onc  Consult was requested by Dr. Audie Box for the evaluation of [name] 72 y.o. female  CC:  Chief Complaint  Patient presents with  . Ovarian mass    Follow up    HPI: [name] is a 72 year old gravida 4 para 3 last moments appeared in 1991. She'll this uterine bleeding 3 weeks ago and underwent a sonohysterogram for evaluation. The findings were notable for a normal uterus with normal echotexture and a small 6 mm fibroid. The left ovary was within normal limits. The right adnexa was noted to be replaced with a thick walled vascular cystic solid mass measuring 11 x 8 4 x 82 mm. There is no evidence of ascites or cul-de-sac fluid. A CA 125 obtained in the same the returned to value of 178. The patient denies any weight changes or early satiety there is no dominant pain nausea vomiting no bloating no changes in bowel or bladder habits. She does note a significant URI for which she is on day one of the Z-Pak   Current Meds:  Outpatient Encounter Prescriptions as of 01/18/2013  Medication Sig Dispense Refill  . aspirin 81 MG tablet Take 81 mg by mouth daily.      . bisoprolol (ZEBETA) 5 MG tablet Take 5 mg by mouth 2 (two) times daily.      . clopidogrel (PLAVIX) 75 MG tablet Take 75 mg by mouth every morning.      Marland Kitchen estradiol (ESTRACE) 1 MG tablet Take 1 tablet (1 mg total) by mouth every morning. PT STATES SHE WANTS ESTRACE--NOT THE GENERIC  90 tablet  3  . fish oil-omega-3 fatty acids 1000 MG capsule Take 1 g by mouth every morning.      . Glucosamine 500 MG CAPS Take by mouth. 2 ONCE A DAY  ( 1000MG  DAILY)      . hydrALAZINE (APRESOLINE) 25 MG tablet Take 25 mg by mouth 2 (two) times daily.      . hydrochlorothiazide (MICROZIDE) 12.5 MG capsule Take 12.5 mg by mouth every morning.      Marland Kitchen levothyroxine (SYNTHROID, LEVOTHROID) 88 MCG tablet Take 88 mcg by mouth every morning. PT STATES SHE NEEDS THE SYNTHROID--DOES NOT WANT THE GENERIC      .  losartan (COZAAR) 50 MG tablet Take 50 mg by mouth every morning.      . Multiple Vitamin (MULITIVITAMIN WITH MINERALS) TABS Take 1 tablet by mouth every morning.      . norethindrone (AYGESTIN) 5 MG tablet Take 0.5 tablets (2.5 mg total) by mouth daily. TAKES ONLY 1/2 TABLET  EVER AM  90 tablet  3  . potassium chloride (K-DUR,KLOR-CON) 10 MEQ tablet Take 10 mEq by mouth 2 (two) times daily.      . propafenone (RYTHMOL) 225 MG tablet Take 225 mg by mouth 2 times daily at 12 noon and 4 pm.       No facility-administered encounter medications on file as of 01/18/2013.    Allergy:  Allergies  Allergen Reactions  . Lisinopril     LIP NUMBNESS    Social Hx:   History   Social History  . Marital Status: Married    Spouse Name: N/A    Number of Children: N/A  . Years of Education: N/A   Occupational History  . Not on file.   Social History Main Topics  . Smoking status: Never Smoker   . Smokeless tobacco: Never Used  . Alcohol Use: No  .  Drug Use: No  . Sexually Active: Not on file   Other Topics Concern  . Not on file   Social History Narrative  . No narrative on file    Past Surgical Hx:  Past Surgical History  Procedure Laterality Date  . Bilateral carpal tunnel repair    . Back surgery    . Dilation and curettage of uterus  1969  . Surgery for ruptured ovarian cyst  1969  . Rhinoplasty for fractured nose  1986  . Left knee arthroscopy   2011  . 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 Dysrhythmia     HX OF PAROXYSMAL ATRIAL FIB  . Hypertension   . Dyslipidemia   . Hypothyroidism   . Arthritis     OSTEOARTHRITIS   -- CONSTANT PAIN RIGHT HIP---AND PAIN LEFT KNEE--PT STATES SHE GETS INJECTIONS INTO HER KNEE  . Complication of anesthesia     BLOOD  PRESSURE DROPPED WITH NASAL SURGERY, ONE OF THE CARPAL TUNNEL REPAIRS AND DURING A COLONOSCOPY    Past Gynecological History: G3 P3113 menarche at the age of 64 with regular menses.  She's been on Estrace and norethindrone for approximately 20 years since menopause. She reports history of OCP use for approximately 1.5 years.  She denies any history of abnormal Pap test.  Reports a right ovarian cystectomy during one of her pregnancies.  Family Hx:  Family History  Problem Relation Age of Onset  . Hypertension Mother     Review of Systems:  Constitutional  Feels well  Cardiovascular  No chest pain, shortness of breath, or edema  Pulmonary  Reports a cough that's nonproductive denies wheezing , denies shortness of breath  Gastro Intestinal  No nausea, vomitting, or diarrhoea. No bright red blood per rectum, no abdominal pain, change in bowel movement, or constipation. No abdominal bloating or early satiety no weight loss Genito Urinary  No frequency, urgency, dysuria, denies any further vaginal bleeding  Musculo Skeletal  No myalgia, arthralgia, joint swelling or pain  Neurologic  No weakness, numbness, change in gait,  Psychology  No depression, anxiety, insomnia.   Vitals:  Blood pressure 132/70, pulse 64, temperature 98.3 F (36.8 C), resp. rate 16, height 5' 2.99" (1.6 m), weight 140 lb 9.6 oz (63.776 kg).  Physical Exam: WD in NAD Neck  Supple NROM, without any enlargements.  Lymph Node Survey No cervical supraclavicular or inguinal adenopathy Cardiovascular  Pulse irregularly, irregular Lungs  Clear to auscultation bilateraly, without wheezes  Skin  No rash/lesions/breakdown  Psychiatry  Alert and oriented to person, place, and time  Abdomen  Normoactive bowel sounds, abdomen soft, non-tender and obese. Back No CVA tenderness Genito Urinary  Vulva/vagina: Normal external female genitalia.  No lesions. No discharge or bleeding.  Bladder/urethra:  No lesions or  masses  Vagina: Atrophic without any lesions discharge or bleeding  Cervix: Normal appearing, no lesions.  Uterus: Small, mobile, no parametrial involvement or nodularity.  Adnexa: Smooth-walled soft right pelvic mass  Rectal  Good tone,  no cul de sac nodularity. Right-sided pelvic mass is palpable Extremities  No bilateral cyanosis, clubbing or edema.   Assessment/Plan:  [name]  is a 72 y.o.  year old with a right solid cystic adnexal mass and elevated CA 125.  At this time she is a significant URI and is receiving azithromycin.  The recommendation was for exploratory laparotomy total abdominal hysterectomy bilateral salpingo-oophorectomy with staging and debulking procedures as indicated based on the intraoperative frozen section. The risks of procedure discussed with the patient and her husband were that of infection bleeding damage to surrounding structures prolonged hospitalization and reoperation.  They are aware that Dr. De Blanch will be the surgeon the procedure will be performed on 02/13/2013  I've advised her that if there is no improvement in her upper respiratory symptoms with use of azithromycin that she should followup with Dr. Timothy Lasso. I've also asked that she be seen by Dr. Bryan Lemma for recommendations regarding management of her Plavix for the underlying atrial fibrillation.  Laurette Schimke, MD, PhD 01/18/2013, 4:38 PM

## 2013-02-14 ENCOUNTER — Encounter (HOSPITAL_COMMUNITY): Payer: Self-pay | Admitting: Gynecology

## 2013-02-14 LAB — CBC
HCT: 29.9 % — ABNORMAL LOW (ref 36.0–46.0)
Hemoglobin: 10.8 g/dL — ABNORMAL LOW (ref 12.0–15.0)
MCH: 34.7 pg — ABNORMAL HIGH (ref 26.0–34.0)
MCHC: 36.1 g/dL — ABNORMAL HIGH (ref 30.0–36.0)
MCV: 96.1 fL (ref 78.0–100.0)

## 2013-02-14 LAB — BASIC METABOLIC PANEL
BUN: 6 mg/dL (ref 6–23)
CO2: 28 mEq/L (ref 19–32)
Chloride: 98 mEq/L (ref 96–112)
GFR calc Af Amer: >90 mL/min (ref >90)
Glucose, Bld: 142 mg/dL — ABNORMAL HIGH (ref 70–99)
Potassium: 3.7 mEq/L (ref 3.5–5.1)

## 2013-02-14 MED ORDER — POTASSIUM CHLORIDE 2 MEQ/ML IV SOLN
INTRAVENOUS | Status: DC
  Filled 2013-02-14: qty 1000

## 2013-02-14 MED ORDER — LOSARTAN POTASSIUM 50 MG PO TABS
50.0000 mg | ORAL_TABLET | Freq: Every day | ORAL | Status: DC
  Administered 2013-02-14 – 2013-02-20 (×7): 50 mg via ORAL
  Filled 2013-02-14 (×7): qty 1

## 2013-02-14 MED ORDER — HYDROCHLOROTHIAZIDE 12.5 MG PO CAPS
12.5000 mg | ORAL_CAPSULE | Freq: Every day | ORAL | Status: DC
  Administered 2013-02-14 – 2013-02-20 (×7): 12.5 mg via ORAL
  Filled 2013-02-14 (×7): qty 1

## 2013-02-14 MED ORDER — KCL IN DEXTROSE-NACL 20-5-0.9 MEQ/L-%-% IV SOLN
INTRAVENOUS | Status: DC
  Administered 2013-02-14 – 2013-02-15 (×5): via INTRAVENOUS
  Administered 2013-02-16: 125 mL/h via INTRAVENOUS
  Administered 2013-02-16 – 2013-02-19 (×7): via INTRAVENOUS
  Filled 2013-02-14 (×20): qty 1000

## 2013-02-14 NOTE — Progress Notes (Signed)
1 Day Post-Op Procedure(s) (LRB): EXPLORATORY LAPAROTOMY TOTAL ABDOMINAL HYSTERECTOMY BILATERAL SALPINGO-OOPHORECTOMY, Partial Rectal Resection with Reanastamosis (Bilateral) OMENTECTOMY PEVLIC  LYMPHADENECTOMY, DEBULKING right pelvic tumor nodules (Right)  Subjective: Patient reports no pain at this time.  Denies nausea or emesis.  Reporting dry mouth and requesting liquids to drink.  Denies chest pain, dyspnea, passing flatus, or having a bowel movement.   Objective: Vital signs in last 24 hours: Temp:  [97.5 F (36.4 C)-98.9 F (37.2 C)] 98.7 F (37.1 C) (03/19 0553) Pulse Rate:  [51-82] 71 (03/19 0553) Resp:  [11-21] 21 (03/19 0819) BP: (96-128)/(50-70) 120/67 mmHg (03/19 0553) SpO2:  [98 %-100 %] 100 % (03/19 0819) Weight:  [139 lb (63.05 kg)] 139 lb (63.05 kg) (03/18 1650) Last BM Date: 02/12/13  Intake/Output from previous day: 03/18 0701 - 03/19 0700 In: 4845.8 [I.V.:4845.8] Out: 2455 [Urine:1950; Drains:155; Blood:350]  Physical Examination: General: alert, cooperative and no distress Resp: clear to auscultation bilaterally Cardio: regular rate and rhythm, S1, S2 normal, no murmur, click, rub or gallop GI: incision: incision with staples open to air, incision clean, dry, and intact.  JP with serosanguinous drainage and abdomen distended, tympanic, active bowel sounds noted Extremities: extremities normal, atraumatic, no cyanosis or edema  Labs: WBC/Hgb/Hct/Plts:  10.1/10.8/29.9/168 (03/19 0427) BUN/Cr/glu/ALT/AST/amyl/lip:  6/0.58/--/--/--/--/-- (03/19 0427)  Assessment: 72 y.o. s/p Procedure(s): EXPLORATORY LAPAROTOMY TOTAL ABDOMINAL HYSTERECTOMY BILATERAL SALPINGO-OOPHORECTOMY, Partial Rectal Resection with Reanastamosis OMENTECTOMY PEVLIC  LYMPHADENECTOMY, DEBULKING right pelvic tumor nodules: stable Pain:  Pain is well-controlled on PCA.  Heme:  Hgb 10.8 and Hct 29.9 this am.  Stable post-operatively.   CV:  History of hypertension and paroxysmal atrial fib.   Current treatment:  Cozaar, HCTZ, Rythmol, Zebeta, Apresoline.   GI:  Tolerating po: No: NPO.  S/P partial rectal resection.     FEN:  Na+ 131 this am.    Prophylaxis: pharmacologic prophylaxis (with any of the following: enoxaparin (Lovenox) 40mg  SQ 2 hours prior to surgery then every day) and intermittent pneumatic compression boots.  Plan: Change IVF to D5NS + KCL at same rate Encourage ambulation Encourage IS use, deep breathing, and coughing Planning on restarting Plavix in the am if stable Maintain NPO status until +BS or +flatus AM labs Continue post-operative plan of care   LOS: 1 day    Manly Nestle DEAL 02/14/2013, 10:03 AM

## 2013-02-14 NOTE — Care Management Note (Signed)
    Page 1 of 1   02/14/2013     10:21:50 AM   CARE MANAGEMENT NOTE 02/14/2013  Patient:  [name], [name]   Account Number:  [id number]  Date Initiated:  02/14/2013  Documentation initiated by:  Lorenda Ishihara  Subjective/Objective Assessment:   72 yo female admitted s/p TAH, BSO, omenectomy, debulking. PTA lived at home with spouse.     Action/Plan:   Home when stable   Anticipated DC Date:  02/17/2013   Anticipated DC Plan:  HOME/SELF CARE      DC Planning Services  CM consult      Choice offered to / List presented to:             Status of service:  Completed, signed off Medicare Important Message given?   (If response is "NO", the following Medicare IM given date fields will be blank) Date Medicare IM given:   Date Additional Medicare IM given:    Discharge Disposition:  HOME/SELF CARE  Per UR Regulation:  Reviewed for med. necessity/level of care/duration of stay  If discussed at Long Length of Stay Meetings, dates discussed:    Comments:

## 2013-02-14 NOTE — Progress Notes (Signed)
Utilization review completed.  

## 2013-02-14 NOTE — Progress Notes (Signed)
Spoke to [employer], NP informed patient ambulated to bathroom then sat in rocker for a few minutes but became tired and went back to bed, also has only urinated 50ml but will try again shortly

## 2013-02-15 LAB — CBC
MCHC: 34.6 g/dL (ref 30.0–36.0)
Platelets: 174 10*3/uL (ref 150–400)
RDW: 12.6 % (ref 11.5–15.5)
WBC: 10.2 10*3/uL (ref 4.0–10.5)

## 2013-02-15 MED ORDER — PANTOPRAZOLE SODIUM 40 MG IV SOLR
40.0000 mg | INTRAVENOUS | Status: DC
  Administered 2013-02-15 – 2013-02-18 (×4): 40 mg via INTRAVENOUS
  Filled 2013-02-15 (×4): qty 40

## 2013-02-15 MED ORDER — CLOPIDOGREL BISULFATE 75 MG PO TABS
75.0000 mg | ORAL_TABLET | Freq: Every evening | ORAL | Status: DC
  Administered 2013-02-15 – 2013-02-19 (×5): 75 mg via ORAL
  Filled 2013-02-15 (×6): qty 1

## 2013-02-15 MED ORDER — LEVOTHYROXINE SODIUM 88 MCG PO TABS
88.0000 ug | ORAL_TABLET | Freq: Every day | ORAL | Status: DC
  Administered 2013-02-16 – 2013-02-20 (×5): 88 ug via ORAL
  Filled 2013-02-15 (×2): qty 1

## 2013-02-15 MED ORDER — ASPIRIN 81 MG PO CHEW
81.0000 mg | CHEWABLE_TABLET | Freq: Every day | ORAL | Status: DC
  Administered 2013-02-15 – 2013-02-20 (×6): 81 mg via ORAL
  Filled 2013-02-15 (×6): qty 1

## 2013-02-15 NOTE — Progress Notes (Signed)
Plan to resume Plavix and Aspirin along with continued Lovenox 40 mg SQ daily per Dr. Stanford Breed.

## 2013-02-15 NOTE — Progress Notes (Signed)
2 Days Post-Op Procedure(s) (LRB): EXPLORATORY LAPAROTOMY TOTAL ABDOMINAL HYSTERECTOMY BILATERAL SALPINGO-OOPHORECTOMY, Partial Rectal Resection with Reanastamosis (Bilateral) OMENTECTOMY PEVLIC  LYMPHADENECTOMY, DEBULKING right pelvic tumor nodules (Right)  Subjective: Patient reports intermittent nausea when brushing her teeth.  She states that she feels the nausea is due to her NPO status and her empty stomach.  Intermittent heartburn reported.  Denies chest pain, dyspnea, passing flatus, or having a bowel movement.   Objective: Vital signs in last 24 hours: Temp:  [97.3 F (36.3 C)-99.3 F (37.4 C)] 98.6 F (37 C) (03/20 0605) Pulse Rate:  [71-82] 78 (03/20 0605) Resp:  [14-16] 16 (03/20 0801) BP: (114-135)/(64-76) 135/70 mmHg (03/20 0605) SpO2:  [95 %-100 %] 95 % (03/20 0801) Last BM Date: 02/12/13  Intake/Output from previous day: 03/19 0701 - 03/20 0700 In: 2839.6 [I.V.:2839.6] Out: 2570 [Urine:2300; Drains:270]  Physical Examination: General: alert, cooperative and no distress Resp: clear to auscultation bilaterally Cardio: regular rate and rhythm, S1, S2 normal, no murmur, click, rub or gallop GI: incision: incision with staples open to air, incision clean, dry, and intact.  JP with serosanguinous drainage and abdomen distended, tympanic, active bowel sounds noted Extremities: extremities normal, atraumatic, no cyanosis or edema  Labs: WBC/Hgb/Hct/Plts:  10.2/10.0/28.9/174 (03/20 0500)    Assessment: 72 y.o. s/p Procedure(s): EXPLORATORY LAPAROTOMY TOTAL ABDOMINAL HYSTERECTOMY BILATERAL SALPINGO-OOPHORECTOMY, Partial Rectal Resection with Reanastamosis OMENTECTOMY PEVLIC  LYMPHADENECTOMY, DEBULKING right pelvic tumor nodules: stable Pain:  Pain is well-controlled on PCA.  Heme:  Hgb 10.0 and Hct 28.9 this am.  Stable post-operatively.   CV:  History of hypertension and paroxysmal atrial fib.  Current treatment:  Cozaar, HCTZ, Rythmol, Zebeta, Apresoline.   GI:   Tolerating po: No: NPO.  S/P partial rectal resection.     FEN:  Na+ 131 on 02/14/13.    Prophylaxis: pharmacologic prophylaxis (with any of the following: enoxaparin (Lovenox) 40mg  SQ 2 hours prior to surgery then every day) and intermittent pneumatic compression boots.  Plan: Am labs Encourage ambulation Encourage IS use, deep breathing, and coughing Maintain NPO status until +BS or +flatus Continue post-operative plan of care   LOS: 2 days    CROSS, MELISSA DEAL 02/15/2013, 9:00 AM

## 2013-02-16 LAB — BASIC METABOLIC PANEL
CO2: 28 mEq/L (ref 19–32)
Glucose, Bld: 116 mg/dL — ABNORMAL HIGH (ref 70–99)
Potassium: 3.1 mEq/L — ABNORMAL LOW (ref 3.5–5.1)
Sodium: 135 mEq/L (ref 135–145)

## 2013-02-16 LAB — CBC
Hemoglobin: 9.1 g/dL — ABNORMAL LOW (ref 12.0–15.0)
Platelets: 150 10*3/uL (ref 150–400)
RBC: 2.69 MIL/uL — ABNORMAL LOW (ref 3.87–5.11)
WBC: 6.6 10*3/uL (ref 4.0–10.5)

## 2013-02-16 MED ORDER — SIMETHICONE 80 MG PO CHEW
80.0000 mg | CHEWABLE_TABLET | Freq: Four times a day (QID) | ORAL | Status: DC
  Administered 2013-02-16 – 2013-02-20 (×14): 80 mg via ORAL
  Filled 2013-02-16 (×20): qty 1

## 2013-02-16 MED ORDER — HYDROMORPHONE HCL PF 1 MG/ML IJ SOLN: 0.5000 mg | INTRAMUSCULAR | Status: DC | PRN

## 2013-02-16 NOTE — Progress Notes (Signed)
Patient ID: [name], female   DOB: 01-May-1941, 72 y.o.   MRN: [id number] 3 Days Post-Op Procedure(s) (LRB): EXPLORATORY LAPAROTOMY TOTAL ABDOMINAL HYSTERECTOMY BILATERAL SALPINGO-OOPHORECTOMY, Partial Rectal Resection with Reanastamosis (Bilateral) OMENTECTOMY PEVLIC  LYMPHADENECTOMY, DEBULKING right pelvic tumor nodules (Right)  Subjective: Patient reports bloating and gas.  - flatus.  tolerating PO.   Ice chips only.  Objective: Vital signs in last 24 hours: Temp:  [98.2 F (36.8 C)-98.7 F (37.1 C)] 98.7 F (37.1 C) (03/21 0519) Pulse Rate:  [65-73] 65 (03/21 0519) Resp:  [13-17] 16 (03/21 0800) BP: (105-125)/(58-73) 105/58 mmHg (03/21 0519) SpO2:  [9 %-100 %] 96 % (03/21 0800) FiO2 (%):  [38 %-96 %] 96 % (03/21 0800) Last BM Date: 02/15/13  Intake/Output from previous day: 03/20 0701 - 03/21 0700 In: 2845.8 [I.V.:2845.8] Out: 3047 [Urine:2250; Drains:797]  Physical Examination: General: alert and no distress GI: normal findings: bowel sounds normal and abnormal findings:  distended, mild tenderness appropriate and incision is clean, dry and intact. Extremities: extremities normal, atraumatic, no cyanosis or edema Vaginal Bleeding: none  Labs: WBC/Hgb/Hct/Plts:  6.6/9.1/26.2/150 (03/21 0420) BUN/Cr/glu/ALT/AST/amyl/lip:  4/0.51/--/--/--/--/-- (03/21 0420)   Assessment:  72 y.o. s/p Procedure(s): EXPLORATORY LAPAROTOMY TOTAL ABDOMINAL HYSTERECTOMY BILATERAL SALPINGO-OOPHORECTOMY, Partial Rectal Resection with Reanastamosis OMENTECTOMY PEVLIC  LYMPHADENECTOMY, DEBULKING right pelvic tumor nodules: stable and ileus present Pain:  Pain is well-controlled on PCA or oral medications.  Heme:Stable  ID: n/a  CV: Stable    GI:  Tolerating po: Yes   Pt with ileus.  Slowly resolving.  Advance diet to clears.  Prophylaxis: pharmacologic prophylaxis (with any of the following: enoxaparin (Lovenox) 40mg  SQ 2 hours prior to surgery then every day).  Plan: Advance  diet    LOS: 3 days    HARPER,CHARLES A 02/16/2013, 11:44 AM     3 Days Post-Op

## 2013-02-17 MED ORDER — POLYETHYLENE GLYCOL 3350 17 G PO PACK
17.0000 g | PACK | Freq: Every day | ORAL | Status: DC
  Administered 2013-02-17 – 2013-02-20 (×4): 17 g via ORAL
  Filled 2013-02-17 (×4): qty 1

## 2013-02-17 NOTE — Progress Notes (Signed)
Patient ID: [name], female   DOB: [birth year], 72 y.o.   MRN: [id number] 4 Days Post-Op Procedure(s) (LRB): EXPLORATORY LAPAROTOMY TOTAL ABDOMINAL HYSTERECTOMY BILATERAL SALPINGO-OOPHORECTOMY, Partial Rectal Resection with Reanastamosis (Bilateral) OMENTECTOMY PEVLIC  LYMPHADENECTOMY, DEBULKING right pelvic tumor nodules (Right)  Subjective: Patient reports   tolerating PO.    Objective: Vital signs in last 24 hours: Temp:  [97.7 F (36.5 C)-98 F (36.7 C)] 97.7 F (36.5 C) (03/22 1400) Pulse Rate:  [68-74] 68 (03/22 1400) Resp:  [18] 18 (03/22 1400) BP: (122-149)/(65-86) 122/65 mmHg (03/22 1400) SpO2:  [97 %-100 %] 100 % (03/22 1400) Last BM Date: 02/12/13  Intake/Output from previous day: 03/21 0701 - 03/22 0700 In: 3389.2 [P.O.:360; I.V.:3029.2] Out: 2990 [Urine:2560; Drains:430]  Physical Examination: General: alert and no distress GI: normal findings: soft, non-tender Extremities: extremities normal, atraumatic, no cyanosis or edema Vaginal Bleeding: none  Labs:       Assessment:  72 y.o. s/p Procedure(s): EXPLORATORY LAPAROTOMY TOTAL ABDOMINAL HYSTERECTOMY BILATERAL SALPINGO-OOPHORECTOMY, Partial Rectal Resection with Reanastamosis OMENTECTOMY   PEVLIC  LYMPHADENECTOMY, DEBULKING right pelvic tumor nodules: stable and Resolving ileus. Pain:  Pain is well-controlled on PCA or oral medications.  Heme: Stable  ID: Stable  CV: Stable  GI:  Tolerating po: Yes   Pt with ileus.  Resolving  Plan: Clear liquids   LOS: 4 days    Daton Szilagyi A 02/17/2013, 8:04 PM

## 2013-02-17 NOTE — Progress Notes (Signed)
Dr Clearance Coots called regarding patient cbgs.  Patient is on full diet and blood sugars have been controlled.  Orders received to changed blood sugars to achs.  PCA also discontinued due to patient not using the medication.

## 2013-02-17 NOTE — Progress Notes (Signed)
Dr. Clearance Coots aware via phone that pt had home medication, Estrace, at bedside earlier. Pt admitted taking med and was "afraid to not take it". Pt made aware at that time there was no order for the med and hospital policy was home medications if ordered need to be taken to pharmacy to then be dispensed with verbalized understanding. Dr. Clearance Coots instructed nurse to tell pt not to take her Estrace any longer. MD to round in am and talk with pt. Pt made aware of MD's instructions.

## 2013-02-18 MED ORDER — PANTOPRAZOLE SODIUM 40 MG PO TBEC
40.0000 mg | DELAYED_RELEASE_TABLET | Freq: Every day | ORAL | Status: DC
  Administered 2013-02-19 – 2013-02-20 (×2): 40 mg via ORAL
  Filled 2013-02-18 (×2): qty 1

## 2013-02-18 NOTE — Progress Notes (Signed)
5 Days Post-Op Procedure(s) (LRB): EXPLORATORY LAPAROTOMY TOTAL ABDOMINAL HYSTERECTOMY BILATERAL SALPINGO-OOPHORECTOMY, Partial Rectal Resection with Reanastamosis (Bilateral) OMENTECTOMY PEVLIC  LYMPHADENECTOMY, DEBULKING right pelvic tumor nodules (Right)  Subjective: Patient reports tolerating PO.  Full liquids.  Objective: I have reviewed patient's vital signs, intake and output, medications and labs.  General: alert and no distress GI: normal findings: soft, non-tender and Incision clean. Extremities: extremities normal, atraumatic, no cyanosis or edema Vaginal Bleeding: none  Assessment: s/p Procedure(s): EXPLORATORY LAPAROTOMY TOTAL ABDOMINAL HYSTERECTOMY BILATERAL SALPINGO-OOPHORECTOMY, Partial Rectal Resection with Reanastamosis (Bilateral) OMENTECTOMY PEVLIC  LYMPHADENECTOMY, DEBULKING right pelvic tumor nodules (Right): stable, progressing well and tolerating diet  Plan: Advance diet  LOS: 5 days    Sahaj Bona A 02/18/2013, 6:15 AM

## 2013-02-18 NOTE — Progress Notes (Signed)
This patient is receiving IV Protonix. Based on criteria approved by the Pharmacy and Therapeutics Committee, this medication is being converted to the equivalent oral dose form. These criteria include:   . The patient is eating (either orally or per tube) and/or has been taking other orally administered medications for at least 24 hours.  . This patient has no evidence of active gastrointestinal bleeding or impaired GI absorption (gastrectomy, short bowel, patient on TNA or NPO).   If you have questions about this conversion, please contact the pharmacy department.  [name] [place], [facility] 02/18/2013 3:53 PM

## 2013-02-19 NOTE — Progress Notes (Signed)
Patient JP is not draining into bulb. Site is draining around the JP have plenty of drain gauze surrounding site to collect drainage. Dr Clearance Coots was made aware by the patient of leaking. Saprina on day shift stated she advised Dr Clearance Coots of leakage as well. Lurena Joiner, RN

## 2013-02-19 NOTE — Progress Notes (Signed)
6 Days Post-Op Procedure(s) (LRB): EXPLORATORY LAPAROTOMY TOTAL ABDOMINAL HYSTERECTOMY BILATERAL SALPINGO-OOPHORECTOMY, Partial Rectal Resection with Reanastamosis (Bilateral) OMENTECTOMY PEVLIC  LYMPHADENECTOMY, DEBULKING right pelvic tumor nodules (Right)  Subjective: Patient reports tolerating PO, + flatus, + BM and no problems voiding.    Objective: I have reviewed patient's vital signs, intake and output, medications, labs, microbiology and pathology.  General: alert and no distress GI: normal findings: bowel sounds normal and soft, non-tender and incision: clean, dry and intact Extremities: extremities normal, atraumatic, no cyanosis or edema Vaginal Bleeding: none  Assessment: s/p Procedure(s): EXPLORATORY LAPAROTOMY TOTAL ABDOMINAL HYSTERECTOMY BILATERAL SALPINGO-OOPHORECTOMY, Partial Rectal Resection with Reanastamosis (Bilateral) OMENTECTOMY PEVLIC  LYMPHADENECTOMY, DEBULKING right pelvic tumor nodules (Right): stable, tolerating diet and resolving ileus.  Plan: Advance diet  LOS: 6 days    Jamelyn Bovard A 02/19/2013, 6:36 PM

## 2013-02-20 MED ORDER — ENOXAPARIN (LOVENOX) PATIENT EDUCATION KIT
PACK | Freq: Once | Status: DC
  Filled 2013-02-20: qty 1

## 2013-02-20 MED ORDER — ENOXAPARIN SODIUM 40 MG/0.4ML ~~LOC~~ SOLN: 40.0000 mg | SUBCUTANEOUS | Status: DC

## 2013-02-20 NOTE — Discharge Summary (Signed)
Physician Discharge Summary  Patient ID: [name] MRN: [id number] DOB/AGE: 1941-06-13 72 y.o.  Admit date: 02/13/2013 Discharge date: 02/20/2013  Admission Diagnoses: Mass, ovarian  Discharge Diagnoses:  Principal Problem:   Mass, ovarian  Discharged Condition: The patient is in good condition and stable for discharge.  Hospital Course: On 02/13/2013, the patient underwent the following: Procedure(s):  EXPLORATORY LAPAROTOMY TOTAL ABDOMINAL HYSTERECTOMY BILATERAL SALPINGO-OOPHORECTOMY, Partial Rectal Resection with Re-anastamosis, OMENTECTOMY PEVLIC  LYMPHADENECTOMY, DEBULKING right pelvic tumor nodules.  The postoperative course was uneventful and diet advancement was initiated slowly.  She was discharged to home on postoperative day 7 tolerating a regular diet.  Her staples and JP drain were removed without difficulty by Telford Nab, RN and steri strips were applied.  Consults: None  Significant Diagnostic Studies: None  Treatments: surgery: see above  Discharge Exam: Blood pressure 143/83, pulse 80, temperature 97.8 F (36.6 C), temperature source Oral, resp. rate 18, height 5\' 3"  (1.6 m), weight 139 lb (63.05 kg), SpO2 98.00%. General appearance: alert, cooperative and no distress Resp: clear to auscultation bilaterally Cardio: regular rate and rhythm, S1, S2 normal, no murmur, click, rub or gallop GI: soft, non-tender; bowel sounds normal; no masses,  no organomegaly and abdomen mildly distended, soft Extremities: extremities normal, atraumatic, no cyanosis or edema Incision/Wound: Midline incision with steri strips.  JP removed by Telford Nab, RN and dressing intact.  Disposition: 06-Home-Health Care Svc      Discharge Orders   Future Appointments Provider Department Dept Phone   02/26/2013 8:30 AM Jeannette Corpus, MD [facility] [phone]   Future Orders Complete By Expires     Call MD for:  difficulty  breathing, headache or visual disturbances  As directed     Call MD for:  extreme fatigue  As directed     Call MD for:  hives  As directed     Call MD for:  persistant dizziness or light-headedness  As directed     Call MD for:  persistant nausea and vomiting  As directed     Call MD for:  redness, tenderness, or signs of infection (pain, swelling, redness, odor or green/yellow discharge around incision site)  As directed     Call MD for:  severe uncontrolled pain  As directed     Call MD for:  temperature >100.4  As directed     Diet - low sodium heart healthy  As directed     Driving Restrictions  As directed     Comments:      No driving for 2 weeks.  Do not take narcotics and drive.    Increase activity slowly  As directed     Lifting restrictions  As directed     Comments:      No lifting greater than 10 lbs.    Sexual Activity Restrictions  As directed     Comments:      No sexual activity, nothing in the vagina, for 6 weeks.        Medication List    TAKE these medications       aspirin 81 MG tablet  Take 81 mg by mouth daily.     bisoprolol 5 MG tablet  Commonly known as:  ZEBETA  Take 5 mg by mouth 2 (two) times daily.     clopidogrel 75 MG tablet  Commonly known as:  PLAVIX  Take 75 mg by mouth every evening.     enoxaparin 40 MG/0.4ML  injection  Commonly known as:  LOVENOX  Inject 0.4 mLs (40 mg total) into the skin daily.     estradiol 1 MG tablet  Commonly known as:  ESTRACE  Take 1 tablet (1 mg total) by mouth every morning. PT STATES SHE WANTS ESTRACE--NOT THE GENERIC     fish oil-omega-3 fatty acids 1000 MG capsule  Take 1 g by mouth every morning.     GLUCOSAMINE HCL PO  Take 1,000 mg by mouth daily.     hydrALAZINE 25 MG tablet  Commonly known as:  APRESOLINE  Take 25 mg by mouth 2 (two) times daily.     hydrochlorothiazide 12.5 MG capsule  Commonly known as:  MICROZIDE  Take 12.5 mg by mouth every morning.     levothyroxine 88 MCG tablet   Commonly known as:  SYNTHROID, LEVOTHROID  Take 88 mcg by mouth every morning. PT STATES SHE NEEDS THE SYNTHROID--DOES NOT WANT THE GENERIC     losartan 50 MG tablet  Commonly known as:  COZAAR  Take 50 mg by mouth every morning.     multivitamin with minerals Tabs  Take 1 tablet by mouth every morning.     polyethylene glycol packet  Commonly known as:  MIRALAX / GLYCOLAX  Take 17 g by mouth daily.     potassium chloride 10 MEQ tablet  Commonly known as:  K-DUR,KLOR-CON  Take 10 mEq by mouth 2 (two) times daily.     propafenone 225 MG tablet  Commonly known as:  RYTHMOL  Take 225 mg by mouth 2 (two) times daily. 9am9pm       Follow-up Information   Follow up with Jeannette Corpus, MD On 02/26/2013.   Contact information:   [address] [place] [address] [phone]       Signed: CROSS, MELISSA DEAL 02/20/2013, 1:02 PM

## 2013-02-21 ENCOUNTER — Telehealth: Payer: Self-pay | Admitting: Oncology

## 2013-02-21 NOTE — Telephone Encounter (Signed)
PT SCHEDULED PER MD.  [name] PACKET MAILED

## 2013-02-23 ENCOUNTER — Telehealth: Payer: Self-pay | Admitting: Oncology

## 2013-02-23 NOTE — Telephone Encounter (Signed)
C/D 02/23/13 for appt. 03/07/13

## 2013-02-26 ENCOUNTER — Ambulatory Visit: Payer: Medicare Other | Attending: Gynecology | Admitting: Gynecology

## 2013-02-26 ENCOUNTER — Encounter: Payer: Self-pay | Admitting: Gynecology

## 2013-02-26 DIAGNOSIS — Z90710 Acquired absence of both cervix and uterus: Secondary | ICD-10-CM | POA: Insufficient documentation

## 2013-02-26 DIAGNOSIS — C569 Malignant neoplasm of unspecified ovary: Secondary | ICD-10-CM | POA: Insufficient documentation

## 2013-02-26 NOTE — Patient Instructions (Signed)
Return to see [place] in 6 weeks for postoperative followup.

## 2013-02-26 NOTE — Progress Notes (Signed)
Consult Note: Gyn-Onc   [name] 72 y.o. female  Chief Complaint  Patient presents with  . Ovarian Cancer    Follow up    Assessment : A stage IIB poorly differentiated ovarian carcinoma.  Plan: Postoperative instructions are reviewed. I recommend the patient be treated with dose dense carboplatin and Taxol for 6 cycles. She has an appointment for chemotherapy teaching next week. She will return to see [place] for a six-week postoperative checkup.  Given recent evidence at the [facility] meeting, there appears to be no advantage to adding a Avastin in patients who have been optimally debulked.  Interval History:  Patient returns today now one week following surgical resection of a poorly differentiated ovarian cancer. Final pathology showed this to be a stage IIB. She had a resection of a portion of rectum with re anastomoses. She reports she is having daily bowel movements which are soft. She uses MiraLAX. She has no other GI or GU symptoms. Her functional status is improving. HPI: The patient initially presented with a pelvic mass and elevated CA 125 (178 units per mL and (she underwent exploratory laparotomy and debulking on 02/13/2013. Final pathology showed a poorly differentiated ovarian cancer involving both ovaries pelvic peritoneum and rectal muscularis. All gross disease was resected.  Review of Systems:10 point review of systems is negative except as noted in interval history.   Vitals: Blood pressure 128/68, pulse 96, temperature 97.6 F (36.4 C), resp. rate 20, height 5' 2.99" (1.6 m), weight 134 lb 14.4 oz (61.19 kg).  Physical Exam: General : The patient is a healthy woman in no acute distress.  HEENT: normocephalic, extraoccular movements normal; neck is supple without thyromegally  Lynphnodes: Supraclavicular and inguinal nodes not enlarged  Abdomen: Soft, non-tender, no ascites, no organomegally, no masses, no hernias incision is healing well  Lower extremities: No  edema or varicosities. Normal range of motion      Allergies  Allergen Reactions  . Codeine Other (See Comments)    Does not like the feeling she gets  . Lisinopril     LIP NUMBNESS    Past Medical History  Diagnosis Date  . Hypertension   . Dyslipidemia   . Hypothyroidism   . Arthritis     OSTEOARTHRITIS   -- CONSTANT PAIN RIGHT HIP---AND PAIN LEFT KNEE--PT STATES SHE GETS INJECTIONS INTO HER KNEE  . Dysrhythmia     HX OF PAROXYSMAL ATRIAL FIB  . Complication of anesthesia     BLOOD PRESSURE DROPPED WITH NASAL SURGERY, ONE OF THE CARPAL TUNNEL REPAIRS AND DURING A COLONOSCOPY    Past Surgical History  Procedure Laterality Date  . Bilateral carpal tunnel repair  2007  . Dilation and curettage of uterus  1969  . Surgery for ruptured ovarian cyst  1969  . Rhinoplasty for fractured nose  1986  . Left knee arthroscopy   2011  . Total hip arthroplasty  02/25/2012    Procedure: TOTAL HIP ARTHROPLASTY ANTERIOR APPROACH;  Surgeon: Kathryne Hitch, MD;  Location: [facility];  Service: Orthopedics;  Laterality: Right;  . Tonsillectomy  1962  . Laparotomy Bilateral 02/13/2013    Procedure: EXPLORATORY LAPAROTOMY TOTAL ABDOMINAL HYSTERECTOMY BILATERAL SALPINGO-OOPHORECTOMY, Partial Rectal Resection with Reanastamosis;  Surgeon: Jeannette Corpus, MD;  Location: [facility];  Service: Gynecology;  Laterality: Bilateral;  . Omentectomy  02/13/2013    Procedure: OMENTECTOMY;  Surgeon: Jeannette Corpus, MD;  Location: [facility];  Service: Gynecology;;  . Lymphadenectomy Right 02/13/2013    Procedure:  PEVLIC  LYMPHADENECTOMY, DEBULKING right pelvic tumor nodules;  Surgeon: Jeannette Corpus, MD;  Location: [facility];  Service: Gynecology;  Laterality: Right;    Current Outpatient Prescriptions  Medication Sig Dispense Refill  . aspirin 81 MG tablet Take 81 mg by mouth daily.      . bisoprolol (ZEBETA) 5 MG tablet Take 5 mg by mouth 2 (two) times daily.      . clopidogrel  (PLAVIX) 75 MG tablet Take 75 mg by mouth every evening.       . enoxaparin (LOVENOX) 40 MG/0.4ML injection Inject 0.4 mLs (40 mg total) into the skin daily.  21 Syringe  0  . estradiol (ESTRACE) 1 MG tablet Take 1 tablet (1 mg total) by mouth every morning. PT STATES SHE WANTS ESTRACE--NOT THE GENERIC  90 tablet  3  . fish oil-omega-3 fatty acids 1000 MG capsule Take 1 g by mouth every morning.      Marland Kitchen GLUCOSAMINE HCL PO Take 1,000 mg by mouth daily.      . hydrALAZINE (APRESOLINE) 25 MG tablet Take 25 mg by mouth 2 (two) times daily.      . hydrochlorothiazide (MICROZIDE) 12.5 MG capsule Take 12.5 mg by mouth every morning.      Marland Kitchen levothyroxine (SYNTHROID, LEVOTHROID) 88 MCG tablet Take 88 mcg by mouth every morning. PT STATES SHE NEEDS THE SYNTHROID--DOES NOT WANT THE GENERIC      . losartan (COZAAR) 50 MG tablet Take 50 mg by mouth every morning.      . Multiple Vitamin (MULITIVITAMIN WITH MINERALS) TABS Take 1 tablet by mouth every morning.      . polyethylene glycol (MIRALAX / GLYCOLAX) packet Take 17 g by mouth daily.      . potassium chloride (K-DUR,KLOR-CON) 10 MEQ tablet Take 10 mEq by mouth 2 (two) times daily.      . propafenone (RYTHMOL) 225 MG tablet Take 225 mg by mouth 2 (two) times daily. 9am9pm       No current facility-administered medications for this visit.    History   Social History  . Marital Status: Married    Spouse Name: N/A    Number of Children: N/A  . Years of Education: N/A   Occupational History  . Not on file.   Social History Main Topics  . Smoking status: Never Smoker   . Smokeless tobacco: Never Used  . Alcohol Use: No  . Drug Use: No  . Sexually Active: Not on file   Other Topics Concern  . Not on file   Social History Narrative  . No narrative on file    Family History  Problem Relation Age of Onset  . Hypertension Mother       Jeannette Corpus, MD 02/26/2013, 9:00 AM

## 2013-02-28 ENCOUNTER — Ambulatory Visit: Payer: Medicare Other

## 2013-02-28 ENCOUNTER — Other Ambulatory Visit: Payer: Medicare Other

## 2013-02-28 ENCOUNTER — Encounter: Payer: Self-pay | Admitting: *Deleted

## 2013-02-28 ENCOUNTER — Encounter: Payer: Self-pay | Admitting: Oncology

## 2013-02-28 NOTE — Progress Notes (Signed)
Gave patient my card to call if any needs. She is new patient and will see the dr next week.

## 2013-03-02 ENCOUNTER — Other Ambulatory Visit: Payer: Self-pay | Admitting: Internal Medicine

## 2013-03-02 DIAGNOSIS — Z1231 Encounter for screening mammogram for malignant neoplasm of breast: Secondary | ICD-10-CM

## 2013-03-06 ENCOUNTER — Other Ambulatory Visit: Payer: Self-pay | Admitting: Oncology

## 2013-03-06 DIAGNOSIS — C569 Malignant neoplasm of unspecified ovary: Secondary | ICD-10-CM

## 2013-03-07 ENCOUNTER — Telehealth: Payer: Self-pay | Admitting: Oncology

## 2013-03-07 ENCOUNTER — Other Ambulatory Visit: Payer: Self-pay | Admitting: *Deleted

## 2013-03-07 ENCOUNTER — Ambulatory Visit
Admission: RE | Admit: 2013-03-07 | Discharge: 2013-03-07 | Disposition: A | Discharge status: Home / Self Care | Payer: Medicare Other | Source: Ambulatory Visit | Attending: Internal Medicine | Admitting: Internal Medicine

## 2013-03-07 ENCOUNTER — Other Ambulatory Visit (HOSPITAL_BASED_OUTPATIENT_CLINIC_OR_DEPARTMENT_OTHER): Payer: Medicare Other | Admitting: Lab

## 2013-03-07 ENCOUNTER — Encounter: Payer: Self-pay | Admitting: Oncology

## 2013-03-07 ENCOUNTER — Ambulatory Visit (HOSPITAL_BASED_OUTPATIENT_CLINIC_OR_DEPARTMENT_OTHER): Payer: Medicare Other | Admitting: Oncology

## 2013-03-07 VITALS — BP 139/71 | HR 66 | Temp 97.4°F | Resp 18 | Ht 63.0 in | Wt 133.8 lb

## 2013-03-07 DIAGNOSIS — I10 Essential (primary) hypertension: Secondary | ICD-10-CM

## 2013-03-07 DIAGNOSIS — C7982 Secondary malignant neoplasm of genital organs: Secondary | ICD-10-CM

## 2013-03-07 DIAGNOSIS — Z1231 Encounter for screening mammogram for malignant neoplasm of breast: Secondary | ICD-10-CM

## 2013-03-07 DIAGNOSIS — C792 Secondary malignant neoplasm of skin: Secondary | ICD-10-CM

## 2013-03-07 DIAGNOSIS — N838 Other noninflammatory disorders of ovary, fallopian tube and broad ligament: Secondary | ICD-10-CM

## 2013-03-07 DIAGNOSIS — C569 Malignant neoplasm of unspecified ovary: Secondary | ICD-10-CM

## 2013-03-07 LAB — CBC WITH DIFFERENTIAL/PLATELET
BASO%: 0.5 % (ref 0.0–2.0)
Basophils Absolute: 0 10*3/uL (ref 0.0–0.1)
EOS%: 1 % (ref 0.0–7.0)
HGB: 11.9 g/dL (ref 11.6–15.9)
MCH: 32.5 pg (ref 25.1–34.0)
MCHC: 33.7 g/dL (ref 31.5–36.0)
MCV: 96.4 fL (ref 79.5–101.0)
MONO%: 8.4 % (ref 0.0–14.0)
RDW: 12.5 % (ref 11.2–14.5)

## 2013-03-07 LAB — COMPREHENSIVE METABOLIC PANEL (CC13)
ALT: 17 U/L (ref 0–55)
CO2: 29 mEq/L (ref 22–29)
Calcium: 9.5 mg/dL (ref 8.4–10.4)
Chloride: 98 mEq/L (ref 98–107)
Creatinine: 0.7 mg/dL (ref 0.6–1.1)
Glucose: 109 mg/dl — ABNORMAL HIGH (ref 70–99)
Sodium: 137 mEq/L (ref 136–145)
Total Protein: 6.7 g/dL (ref 6.4–8.3)

## 2013-03-07 LAB — CA 125: CA 125: 63.6 U/mL — ABNORMAL HIGH (ref 0.0–30.2)

## 2013-03-07 LAB — IRON AND TIBC: UIBC: 286 ug/dL (ref 125–400)

## 2013-03-07 MED ORDER — ONDANSETRON HCL 8 MG PO TABS: ORAL_TABLET | ORAL | Status: DC

## 2013-03-07 MED ORDER — DEXAMETHASONE 4 MG PO TABS: ORAL_TABLET | ORAL | Status: DC

## 2013-03-07 MED ORDER — LORAZEPAM 1 MG PO TABS: ORAL_TABLET | ORAL | Status: DC

## 2013-03-07 NOTE — Patient Instructions (Signed)
First chemotherapy will be Tues 4-22 at 10 AM.  The night before chemo Mon 4-21, you need to take decadron (dexamethasone, steroid) five of the 4 mg tablets (=20 mg) with food at 10 PM, and then another five tablets (=20mg ) at 4 AM on 4-22 with food.  The night of chemo, whether or not any nausea, take ativan (lorazepam) at bedtime.  The morning after chemo, whether or not any nausea, take zofran (ondansetron). Other than those doses, you can use the nausea medicines as needed according to directions on bottles.  Call any time if questions or concerns:   [phone]

## 2013-03-07 NOTE — Progress Notes (Signed)
[facility] NEW PATIENT EVALUATION   Name: [name] Date: 03/07/2013 MRN: [id number] DOB: 11-Sep-1941  REFERRING PHYSICIAN: D.ClarkePearson  Other physicians:  Gwen Pounds, MD, T.Fontaine, [name], [name]. Last gastroenterologist in [place].  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    HISTORY OF PRESENT ILLNESS:[name] is a 72 y.o. 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  Patient transferred gyn care to Dr Colin Broach in Oct 2013 with unremarkable pelvic exam then and no PAP done due to last in 2011 and no prior abnormalities; she had been on HRT since age 49. She had ~ 2 weeks vaginal spotting in late Dec 2013, then saw Dr Audie Box in late Jan 2014 after bright red spotting that AM, with uterus larger than had been apparent in Oct. Sonohystogram 01-05-13 showed uterus normal size and echotexture, endometrium 4.3 mm, left ovary normal and right adnexa with 1.1 x 8.4.x8.2 cm cystic and solid mass. Endometrial biopsy benign and CA 125 also on 01-05-13 was 178.8. She had CT AP 01-17-13 with 1.0 x 6.9 x 8.9 cm complex right ovarian mass, no ascites, small retroperitoneal nodes. She was seen by [name] on 01-18-13 and taken to surgery by [name] on 02-13-13, which was TAH/BSO/ omentectomy/ureterolysis/ resection of cul-de-sac tumor/right pelvic lymphadnectomy and resection of rectum with reanastomosis. At completion of surgery there was no gross residual disease. Pathology [phone]) had high grade poorly differentiated carcinoma consistent with high grade transitional cell and high grade serous carcinoma  involving bilateral ovaries and fallopian tubes as well as excised tissue from cul-de-sac and perirectal tissue, with 7 nodes negative and omentum negative. Wash[place] [phone]) had rare clusters of atypical cells. Patient was hospitalized from 3-18 thru 02-20-13, with no unexpected post operative problems; she continues lovenox planned for another week. She saw [name] on 02-26-13 with recommendation for dose dense taxol/ carboplatin, without avastin based on recent SGO information that avastin is not additionally helpful after complete debulking. She attended chemotherapy teaching class prior to this visit.   REVIEW OF SYSTEMS:  Weight 136 lbs prior to surgery. Appetite good now with no nausea or GERD symptoms (tho had GERD symptoms in hospital). Bowels now moving a small amount several times daily, using miralax daily and has added benefiber bid. No fever or symptoms of infection including bladder. Some soreness low abdomen when stands, but no actual pain. Energy fairly good tho still naps in afternoons; has started walking again daily, tho not as far as her usual. No HA, good visual acuity with glasses, minimal environmental allergy symptoms, no known dental problems (up to date on cleanings every 6 months), no respiratory symptoms. Had mammogram today. Stable synthroid by Dr Timothy Lasso x years. Some osteoarthritis hip,left knee, back for which she occasionally gets injections in knee by Dr Maureen Ralphs.  ALLERGIES: Codeine and Lisinopril  PAST MEDICAL/ SURGICAL HISTORY:  has a past medical history of Hypertension; Dyslipidemia; Hypothyroidism; Arthritis; Dysrhythmia; and Complication of anesthesia.   Gravida 3  HTN since ~ 1997 Hypothyroid x years, on stable synthroid by Dr Timothy Lasso Paroxysmal A fib "only once" followed yearly by Dr Bryan Lemma, on plavix Bilateral carpal tunnel repair Total hip replacement right by Dr Magnus Ivan [phone] Minimal  stress urinary incontinence DEXA 2011  normal Colonoscopy 2005 in [place], due 2015.  CURRENT MEDICATIONS: reviewed in EMR. Uses [facility]. Prescriptions sent for decadron which she will use 20 mg 12 hrs and 6 hrs prior to first taxol, then if no problems will decrease to 20 mg 12 hrs prior only; ativan; zofran.    SOCIAL HISTORY:  reports that she has never smoked. She has never used smokeless tobacco. She reports that she does not drink alcohol or use illicit drugs. Originally from [place], has lived in [place] and [place] prior to coming to [place] in 2005. 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 3 children all live in [place], twin daughters age 32 and son age 90, 5 grandchildren. Husband worked in Radiographer, therapeutic, retired. No tobacco or alcohol, no transfusions. Patient and husband keep 43 yo granddaughter Kara Mead after preschool in afternoons.  They plan a family vacation to the beach week of June 16 and she requests no chemotherapy during that time.  FAMILY HISTORY: family history includes Hypertension in her mother. Father died with AAA  Mother died with intracerebral hemorrhage Brother and sister no known medical problems Children and grandchildren healthy No cancer known in family  LABORATORY DATA:  Results for orders placed in visit on 03/07/13 (from the past 48 hour(s))  CBC WITH DIFFERENTIAL     Status: Abnormal   Collection Time    03/07/13  2:49 PM      Result Value Range   WBC 6.0  3.9 - 10.3 10e3/uL   NEUT# 3.4  1.5 - 6.5 10e3/uL   HGB 11.9  11.6 - 15.9 g/dL   HCT 16.1  09.6 - 04.5 %   Platelets 237  145 - 400 10e3/uL   MCV 96.4  79.5 - 101.0 fL   MCH 32.5  25.1 - 34.0 pg   MCHC 33.7  31.5 - 36.0 g/dL   RBC 4.09 (*) 8.11 - 9.14 10e6/uL   RDW 12.5  11.2 - 14.5 %   lymph# 2.0  0.9 - 3.3 10e3/uL   MONO# 0.5  0.1 - 0.9 10e3/uL   Eosinophils Absolute 0.1  0.0 - 0.5 10e3/uL   Basophils Absolute 0.0  0.0 - 0.1 10e3/uL   NEUT% 56.0  38.4 - 76.8  %   LYMPH% 34.1  14.0 - 49.7 %   MONO% 8.4  0.0 - 14.0 %   EOS% 1.0  0.0 - 7.0 %   BASO% 0.5  0.0 - 2.0 %  IRON AND TIBC     Status: Abnormal   Collection Time    03/07/13  2:49 PM      Result Value Range   Iron 58  42 - 145 ug/dL   UIBC 782  956 - 213 ug/dL   TIBC 086  578 - 469 ug/dL   %SAT 17 (*) 20 - 55 %  FERRITIN     Status: None   Collection Time    03/07/13  2:49 PM      Result Value Range   Ferritin 42  10 - 291 ng/mL  CA 125     Status: Abnormal   Collection Time    03/07/13  2:49 PM      Result Value Range   CA 125 63.6 (*) 0.0 - 30.2 U/mL  COMPREHENSIVE METABOLIC PANEL (CC13)     Status: Abnormal   Collection Time    03/07/13  2:49 PM  Result Value Range   Sodium 137  136 - 145 mEq/L   Potassium 3.8  3.5 - 5.1 mEq/L   Chloride 98  98 - 107 mEq/L   CO2 29  22 - 29 mEq/L   Glucose 109 (*) 70 - 99 mg/dl   BUN 19.1  7.0 - 47.8 mg/dL   Creatinine 0.7  0.6 - 1.1 mg/dL   Total Bilirubin 2.95  0.20 - 1.20 mg/dL   Alkaline Phosphatase 74  40 - 150 U/L   AST 16  5 - 34 U/L   ALT 17  0 - 55 U/L   Total Protein 6.7  6.4 - 8.3 g/dL   Albumin 3.2 (*) 3.5 - 5.0 g/dL   Calcium 9.5  8.4 - 62.1 mg/dL    This hemoglobin is up from 9.1 on 02-16-13   PATHOLOGY 02-13-2013  Accession #: [id number] THOLOGFINAL DIAGNOSISDiagnosis 1. Adnexa - ovary +/- tube, neoplastic - HIGH GRADE OVARIAN POORLY DIFFERENTIATED CARCINOMA WITH ASSOCIATED TUMOR NECROSIS, 11.5 CM, INVOLVING THE OVARIAN CAPSULE AND ADJACENT FALLOPIAN TUBE. - PLEASE SEE ONCOLOGY TEMPLATE FOR DETAILS. 2. Ovary and fallopian tube, left - HIGH GRADE OVARIAN POORLY DIFFERENTIATED CARCINOMA WITH ASSOCIATED TUMOR NECROSIS, 2.3 CM, INVOLVING THE OVARIAN CAPSULE. - FALLOPIAN TUBAL TISSUE, NO EVIDENCE OF MALIGNANCY. - PLEASE SEE ONCOLOGY TEMPLATE FOR DETAILS. 3. Uterus and cervix - ENDOMETRIUM: BENIGN WEAKLY PROLIFERATIVE ENDOMETRIUM, NO ATYPIA, HYPERPLASIA OR MALIGNANCY. - CERVIX: BENIGN SQUAMOUS MUCOSA AND  ENDOCERVICAL MUCOSA, NO DYSPLASIA OR MALIGNANCY. - UTERINE SEROSA: ADHESIONS, NO EVIDENCE OF ENDOMETRIOSIS, ATYPIA OR MALIGNANCY. 4. Cul-de-sac biopsy - POSITIVE FOR METASTATIC CARCINOMA (2.0 CM). 5. Soft tissue mass, simple excision, peri rectal - POSITIVE FOR METASTATIC CARCINOMA (2.6 CM). 6. Lymph nodes, regional resection, right pelvic - SEVEN LYMPH NODES, NEGATIVE FOR METASTATIC CARCINOMA (0/7). 7. Omentum, resection for tumor - MATURE ADIPOSE TISSUE, NO EVIDENCE OF MALIGNANCY.PATHOLOGY     RADIOGRAPHY: Mm Digital Screening  03/07/2013  *RADIOLOGY REPORT*  Clinical Data: Screening.  DIGITAL BILATERAL SCREENING MAMMOGRAM WITH CAD  Comparison:  Previous exams.  FINDINGS:  ACR Breast Density Category 3: The breast tissue is heterogeneously dense.  No suspicious masses, architectural distortion, or calcifications are present.  Images were processed with CAD.  IMPRESSION: No mammographic evidence of malignancy.  A result letter of this screening mammogram will be mailed directly to the patient.  RECOMMENDATION: Screening mammogram in one year. (Code:SM-B-01Y)  BI-RADS CATEGORY 1:  Negative.   Original Report Authenticated By: Rolla Plate, M.D.      CT ABDOMEN AND PELVIS WITH CONTRAST 01-17-13 Technique: Multidetector CT imaging of the abdomen and pelvis was  performed following the standard protocol during bolus  administration of intravenous contrast.  Contrast: OMNIPAQUE IOHEXOL 300 MG/ML SOLN  Comparison: None.  Findings: Mild dependent atelectasis at the lung bases.  Small hiatal hernia.  Liver, spleen, pancreas, and adrenal glands are within normal  limits.  Gallbladder is unremarkable. No intrahepatic or extrahepatic  ductal dilatation.  Tiny interpolar right renal cyst (series 2/image 38). Kidneys are  otherwise within normal limits. No hydronephrosis.  No evidence of bowel obstruction.  Atherosclerotic calcifications of the abdominal aorta and branch  vessels.  No  abdominopelvic ascites.  Small retroperitoneal lymph nodes which do not meet pathologic CT  size criteria.  Uterus is grossly unremarkable.  10.0 x 6.9 by 8.9 cm complex right ovarian mass (series 2/image  65), compatible with ovarian neoplasm. The presence of multiple  irregular septations and possible enhancing mural nodularity  (series 2/image 65) are  worrisome for malignancy.  Degenerative changes of the visualized thoracolumbar spine. Right  total hip arthroplasty.  IMPRESSION:  10.0 cm complex right ovarian neoplasm, with findings worrisome for  malignancy. Surgical consultation is advised.  No findings to suggest metastatic disease in the abdomen/pelvis.       PHYSICAL EXAM:  height is 5\' 3"  (1.6 m) and weight is 133 lb 12.8 oz (60.691 kg). Her oral temperature is 97.4 F (36.3 C). Her blood pressure is 139/71 and her pulse is 66. Her respiration is 18.  Pleasant, articulate lady, looks stated age, good historian. Easily mobile other than orthopedic problems. Respirations not labored RA HEENT: normal hair pattern. PERRL, not icteric. Oral mucosa and posterior pharynx clear and moist, good dentition. Neck supple without JVD. Lymphatics: no palpable cervical, supraclavicular, axillary, inguinal adenopathy Heart RRR no gallop Breasts bilaterally without dominant mass, skin or nipple findings. No swelling UE Abdomen soft, not tender, not distended, some bowel sounds, no appreciable HSM or mass. Midline incision closed, no erythema or tenderness.  LE no edema, cords, tenderness Skin without rash or ecchymoses. Peripheral veins appear adequate to begin chemotherapy without central line.    We have discussed all of history as above, the surgery that was done, rationale for adjuvant chemotherapy and use of dose dense taxol/ carbo vs q 3 week dosing. We have discussed outpatient administration of chemotherapy, office follow up, use of premedication decadron and use of the antiemetics.  They understand that they can call at any time if questions or concerns. They have had questions answered to their satisfaction and are in agreement with beginning chemotherapy with day 1 cycle 1 on 03-20-13. I will see her at 4-25 prior to day 8 on 4-29,and with full labs prior to day 1 cycle 2.  IMPRESSION / PLAN:  1.IIB poorly differentiated serous carcinoma involving bilateral ovaries and tubes, as well as cul-de-sac, post optimal debulking including resection of portion of rectum with reanastomosis on 02-13-13: adjuvant dose dense taxol/ carboplatin will begin 03-20-13.  2.HTN, elevated lipids, one episode of paroxysmal Afib. On pradaxa and ASA. 3.hypothyroidism on replacement 4.osteoarthritis, post right hip replacement 2013 5.due colonoscopy 2015 (last 2005 [place])  Time spent, including >50% discussion, 75 min.  Kendall Arnell P, MD 03/07/2013 10:04 PM

## 2013-03-08 ENCOUNTER — Telehealth: Payer: Self-pay | Admitting: *Deleted

## 2013-03-08 NOTE — Telephone Encounter (Signed)
Message copied by Carola Rhine A on Thu Mar 08, 2013  8:55 AM ------      Message from: Jama Flavors P      Created: Thu Mar 08, 2013  8:08 AM       Labs seen and need follow up: please let her know ca125 was 63, so way down even this close to surgery ------

## 2013-03-08 NOTE — Telephone Encounter (Signed)
Pt notified of ca 125. Was pleased with results. Also states they "feel much better about situation after meeting with Dr Darrold Span yesterday" . Instructed pt to call if she has any questions.

## 2013-03-08 NOTE — Telephone Encounter (Signed)
Per staff message and POF I have scheduled appts.  JMW  

## 2013-03-11 DIAGNOSIS — C569 Malignant neoplasm of unspecified ovary: Secondary | ICD-10-CM | POA: Insufficient documentation

## 2013-03-18 ENCOUNTER — Other Ambulatory Visit: Payer: Self-pay | Admitting: Oncology

## 2013-03-20 ENCOUNTER — Ambulatory Visit (HOSPITAL_BASED_OUTPATIENT_CLINIC_OR_DEPARTMENT_OTHER): Payer: Medicare Other

## 2013-03-20 VITALS — BP 123/61 | HR 66 | Temp 97.6°F | Resp 18

## 2013-03-20 DIAGNOSIS — C792 Secondary malignant neoplasm of skin: Secondary | ICD-10-CM

## 2013-03-20 DIAGNOSIS — Z5111 Encounter for antineoplastic chemotherapy: Secondary | ICD-10-CM

## 2013-03-20 DIAGNOSIS — C569 Malignant neoplasm of unspecified ovary: Secondary | ICD-10-CM

## 2013-03-20 MED ORDER — PACLITAXEL CHEMO INJECTION 300 MG/50ML
80.0000 mg/m2 | Freq: Once | INTRAVENOUS | Status: AC
  Administered 2013-03-20: 132 mg via INTRAVENOUS
  Filled 2013-03-20: qty 22

## 2013-03-20 MED ORDER — DEXAMETHASONE SODIUM PHOSPHATE 4 MG/ML IJ SOLN
20.0000 mg | Freq: Once | INTRAMUSCULAR | Status: AC
  Administered 2013-03-20: 20 mg via INTRAVENOUS

## 2013-03-20 MED ORDER — ONDANSETRON 16 MG/50ML IVPB (CHCC)
16.0000 mg | Freq: Once | INTRAVENOUS | Status: AC
  Administered 2013-03-20: 16 mg via INTRAVENOUS

## 2013-03-20 MED ORDER — DIPHENHYDRAMINE HCL 50 MG/ML IJ SOLN
50.0000 mg | Freq: Once | INTRAMUSCULAR | Status: AC
  Administered 2013-03-20: 50 mg via INTRAVENOUS

## 2013-03-20 MED ORDER — SODIUM CHLORIDE 0.9 % IV SOLN
Freq: Once | INTRAVENOUS | Status: AC
  Administered 2013-03-20: 10:00:00 via INTRAVENOUS

## 2013-03-20 MED ORDER — FAMOTIDINE IN NACL 20-0.9 MG/50ML-% IV SOLN
20.0000 mg | Freq: Once | INTRAVENOUS | Status: AC
  Administered 2013-03-20: 20 mg via INTRAVENOUS

## 2013-03-20 MED ORDER — SODIUM CHLORIDE 0.9 % IV SOLN
586.2000 mg | Freq: Once | INTRAVENOUS | Status: AC
  Administered 2013-03-20: 590 mg via INTRAVENOUS
  Filled 2013-03-20: qty 59

## 2013-03-20 NOTE — Patient Instructions (Signed)
[facility] Discharge Instructions for Patients Receiving Chemotherapy  Today you received the following chemotherapy agents Taxol/Carboplatin To help prevent nausea and vomiting after your treatment, we encourage you to take your nausea medication as prescribed.   If you develop nausea and vomiting that is not controlled by your nausea medication, call the clinic. If it is after clinic hours your family physician or the after hours number for the clinic or go to the Emergency Department.   BELOW ARE SYMPTOMS THAT SHOULD BE REPORTED IMMEDIATELY:  *FEVER GREATER THAN 100.5 F  *CHILLS WITH OR WITHOUT FEVER  NAUSEA AND VOMITING THAT IS NOT CONTROLLED WITH YOUR NAUSEA MEDICATION  *UNUSUAL SHORTNESS OF BREATH  *UNUSUAL BRUISING OR BLEEDING  TENDERNESS IN MOUTH AND THROAT WITH OR WITHOUT PRESENCE OF ULCERS  *URINARY PROBLEMS  *BOWEL PROBLEMS  UNUSUAL RASH Items with * indicate a potential emergency and should be followed up as soon as possible.  One of the nurses will contact you 24 hours after your treatment. Please let the nurse know about any problems that you may have experienced. Feel free to call the clinic you have any questions or concerns. The clinic phone number is [phone].   I have been informed and understand all the instructions given to me. I know to contact the clinic, my physician, or go to the Emergency Department if any problems should occur. I do not have any questions at this time, but understand that I may call the clinic during office hours   should I have any questions or need assistance in obtaining follow up care.    __________________________________________  _____________  __________ Signature of Patient or Authorized Representative            Date                   Time    __________________________________________ Nurse's Signature     Paclitaxel injection What is this medicine? PACLITAXEL (PAK li TAX el) is a chemotherapy  drug. It targets fast dividing cells, like cancer cells, and causes these cells to die. This medicine is used to treat ovarian cancer, breast cancer, and other cancers. This medicine may be used for other purposes; ask your health care provider or pharmacist if you have questions. What should I tell my health care provider before I take this medicine? They need to know if you have any of these conditions: -blood disorders -irregular heartbeat -infection (especially a virus infection such as chickenpox, cold sores, or herpes) -liver disease -previous or ongoing radiation therapy -an unusual or allergic reaction to paclitaxel, alcohol, polyoxyethylated castor oil, other chemotherapy agents, other medicines, foods, dyes, or preservatives -pregnant or trying to get pregnant -breast-feeding How should I use this medicine? This drug is given as an infusion into a vein. It is administered in a hospital or clinic by a specially trained health care professional. Talk to your pediatrician regarding the use of this medicine in children. Special care may be needed. Overdosage: If you think you have taken too much of this medicine contact a poison control center or emergency room at once. NOTE: This medicine is only for you. Do not share this medicine with others. What if I miss a dose? It is important not to miss your dose. Call your doctor or health care professional if you are unable to keep an appointment. What may interact with this medicine? Do not take this medicine with any of the following medications: -disulfiram -metronidazole This medicine may   also interact with the following medications: -cyclosporine -dexamethasone -diazepam -ketoconazole -medicines to increase blood counts like filgrastim, pegfilgrastim, sargramostim -other chemotherapy drugs like cisplatin, doxorubicin, epirubicin, etoposide, teniposide, vincristine -quinidine -testosterone -vaccines -verapamil Talk to your  doctor or health care professional before taking any of these medicines: -acetaminophen -aspirin -ibuprofen -ketoprofen -naproxen This list may not describe all possible interactions. Give your health care provider a list of all the medicines, herbs, non-prescription drugs, or dietary supplements you use. Also tell them if you smoke, drink alcohol, or use illegal drugs. Some items may interact with your medicine. What should I watch for while using this medicine? Your condition will be monitored carefully while you are receiving this medicine. You will need important blood work done while you are taking this medicine. This drug may make you feel generally unwell. This is not uncommon, as chemotherapy can affect healthy cells as well as cancer cells. Report any side effects. Continue your course of treatment even though you feel ill unless your doctor tells you to stop. In some cases, you may be given additional medicines to help with side effects. Follow all directions for their use. Call your doctor or health care professional for advice if you get a fever, chills or sore throat, or other symptoms of a cold or flu. Do not treat yourself. This drug decreases your body's ability to fight infections. Try to avoid being around people who are sick. This medicine may increase your risk to bruise or bleed. Call your doctor or health care professional if you notice any unusual bleeding. Be careful brushing and flossing your teeth or using a toothpick because you may get an infection or bleed more easily. If you have any dental work done, tell your dentist you are receiving this medicine. Avoid taking products that contain aspirin, acetaminophen, ibuprofen, naproxen, or ketoprofen unless instructed by your doctor. These medicines may hide a fever. Do not become pregnant while taking this medicine. Women should inform their doctor if they wish to become pregnant or think they might be pregnant. There is a  potential for serious side effects to an unborn child. Talk to your health care professional or pharmacist for more information. Do not breast-feed an infant while taking this medicine. Men are advised not to father a child while receiving this medicine. What side effects may I notice from receiving this medicine? Side effects that you should report to your doctor or health care professional as soon as possible: -allergic reactions like skin rash, itching or hives, swelling of the face, lips, or tongue -low blood counts - This drug may decrease the number of white blood cells, red blood cells and platelets. You may be at increased risk for infections and bleeding. -signs of infection - fever or chills, cough, sore throat, pain or difficulty passing urine -signs of decreased platelets or bleeding - bruising, pinpoint red spots on the skin, black, tarry stools, nosebleeds -signs of decreased red blood cells - unusually weak or tired, fainting spells, lightheadedness -breathing problems -chest pain -high or low blood pressure -mouth sores -nausea and vomiting -pain, swelling, redness or irritation at the injection site -pain, tingling, numbness in the hands or feet -slow or irregular heartbeat -swelling of the ankle, feet, hands Side effects that usually do not require medical attention (report to your doctor or health care professional if they continue or are bothersome): -bone pain -complete hair loss including hair on your head, underarms, pubic hair, eyebrows, and eyelashes -changes in the color of fingernails -  diarrhea -loosening of the fingernails -loss of appetite -muscle or joint pain -red flush to skin -sweating This list may not describe all possible side effects. Call your doctor for medical advice about side effects. You may report side effects to FDA at 1-800-FDA-1088. Where should I keep my medicine? This drug is given in a hospital or clinic and will not be stored at  home. NOTE: This sheet is a summary. It may not cover all possible information. If you have questions about this medicine, talk to your doctor, pharmacist, or health care provider.  2013, [employer]. (10/28/2008 11:54:26 AM)    Carboplatin injection What is this medicine? CARBOPLATIN (KAR boe pla tin) is a chemotherapy drug. It targets fast dividing cells, like cancer cells, and causes these cells to die. This medicine is used to treat ovarian cancer and many other cancers. This medicine may be used for other purposes; ask your health care provider or pharmacist if you have questions. What should I tell my health care provider before I take this medicine? They need to know if you have any of these conditions: -blood disorders -hearing problems -kidney disease -recent or ongoing radiation therapy -an unusual or allergic reaction to carboplatin, cisplatin, other chemotherapy, other medicines, foods, dyes, or preservatives -pregnant or trying to get pregnant -breast-feeding How should I use this medicine? This drug is usually given as an infusion into a vein. It is administered in a hospital or clinic by a specially trained health care professional. Talk to your pediatrician regarding the use of this medicine in children. Special care may be needed. Overdosage: If you think you have taken too much of this medicine contact a poison control center or emergency room at once. NOTE: This medicine is only for you. Do not share this medicine with others. What if I miss a dose? It is important not to miss a dose. Call your doctor or health care professional if you are unable to keep an appointment. What may interact with this medicine? -medicines for seizures -medicines to increase blood counts like filgrastim, pegfilgrastim, sargramostim -some antibiotics like amikacin, gentamicin, neomycin, streptomycin, tobramycin -vaccines Talk to your doctor or health care professional before  taking any of these medicines: -acetaminophen -aspirin -ibuprofen -ketoprofen -naproxen This list may not describe all possible interactions. Give your health care provider a list of all the medicines, herbs, non-prescription drugs, or dietary supplements you use. Also tell them if you smoke, drink alcohol, or use illegal drugs. Some items may interact with your medicine. What should I watch for while using this medicine? Your condition will be monitored carefully while you are receiving this medicine. You will need important blood work done while you are taking this medicine. This drug may make you feel generally unwell. This is not uncommon, as chemotherapy can affect healthy cells as well as cancer cells. Report any side effects. Continue your course of treatment even though you feel ill unless your doctor tells you to stop. In some cases, you may be given additional medicines to help with side effects. Follow all directions for their use. Call your doctor or health care professional for advice if you get a fever, chills or sore throat, or other symptoms of a cold or flu. Do not treat yourself. This drug decreases your body's ability to fight infections. Try to avoid being around people who are sick. This medicine may increase your risk to bruise or bleed. Call your doctor or health care professional if you notice any unusual bleeding.   Be careful brushing and flossing your teeth or using a toothpick because you may get an infection or bleed more easily. If you have any dental work done, tell your dentist you are receiving this medicine. Avoid taking products that contain aspirin, acetaminophen, ibuprofen, naproxen, or ketoprofen unless instructed by your doctor. These medicines may hide a fever. Do not become pregnant while taking this medicine. Women should inform their doctor if they wish to become pregnant or think they might be pregnant. There is a potential for serious side effects to an  unborn child. Talk to your health care professional or pharmacist for more information. Do not breast-feed an infant while taking this medicine. What side effects may I notice from receiving this medicine? Side effects that you should report to your doctor or health care professional as soon as possible: -allergic reactions like skin rash, itching or hives, swelling of the face, lips, or tongue -signs of infection - fever or chills, cough, sore throat, pain or difficulty passing urine -signs of decreased platelets or bleeding - bruising, pinpoint red spots on the skin, black, tarry stools, nosebleeds -signs of decreased red blood cells - unusually weak or tired, fainting spells, lightheadedness -breathing problems -changes in hearing -changes in vision -chest pain -high blood pressure -low blood counts - This drug may decrease the number of white blood cells, red blood cells and platelets. You may be at increased risk for infections and bleeding. -nausea and vomiting -pain, swelling, redness or irritation at the injection site -pain, tingling, numbness in the hands or feet -problems with balance, talking, walking -trouble passing urine or change in the amount of urine Side effects that usually do not require medical attention (report to your doctor or health care professional if they continue or are bothersome): -hair loss -loss of appetite -metallic taste in the mouth or changes in taste This list may not describe all possible side effects. Call your doctor for medical advice about side effects. You may report side effects to FDA at 1-800-FDA-1088. Where should I keep my medicine? This drug is given in a hospital or clinic and will not be stored at home. NOTE: This sheet is a summary. It may not cover all possible information. If you have questions about this medicine, talk to your doctor, pharmacist, or health care provider.  2013, [employer] Standard. (02/20/2008 2:38:05 PM)   

## 2013-03-21 ENCOUNTER — Telehealth: Payer: Self-pay | Admitting: *Deleted

## 2013-03-21 NOTE — Telephone Encounter (Signed)
Mrs. Grams says she is doing very well.  Tired but says she is still recovering from surgery.  Denies n/v and denies trouble with bowels or bladder.  Encouraged to drink lots of fluids (64 oz) daily and she is trying.  Denies questions at this time.

## 2013-03-21 NOTE — Telephone Encounter (Signed)
Message copied by Augusto Garbe on Wed Mar 21, 2013  3:35 PM ------      Message from: Corky Sing      Created: Tue Mar 20, 2013 12:02 PM      Regarding: Chemo follow up call      Contact: [phone]       First time Taxol/Carbo. Dr. Darrold Span patient. Please call.      Thanks,      Santina Evans ------

## 2013-03-23 ENCOUNTER — Ambulatory Visit (HOSPITAL_BASED_OUTPATIENT_CLINIC_OR_DEPARTMENT_OTHER): Payer: Medicare Other | Admitting: Oncology

## 2013-03-23 ENCOUNTER — Other Ambulatory Visit: Payer: Medicare Other | Admitting: Lab

## 2013-03-23 ENCOUNTER — Encounter: Payer: Self-pay | Admitting: Oncology

## 2013-03-23 ENCOUNTER — Other Ambulatory Visit (HOSPITAL_BASED_OUTPATIENT_CLINIC_OR_DEPARTMENT_OTHER): Payer: Medicare Other

## 2013-03-23 VITALS — BP 114/92 | HR 61 | Temp 98.1°F | Resp 20 | Ht 63.0 in | Wt 135.0 lb

## 2013-03-23 DIAGNOSIS — I10 Essential (primary) hypertension: Secondary | ICD-10-CM

## 2013-03-23 DIAGNOSIS — C569 Malignant neoplasm of unspecified ovary: Secondary | ICD-10-CM

## 2013-03-23 DIAGNOSIS — E039 Hypothyroidism, unspecified: Secondary | ICD-10-CM

## 2013-03-23 DIAGNOSIS — C561 Malignant neoplasm of right ovary: Secondary | ICD-10-CM

## 2013-03-23 LAB — COMPREHENSIVE METABOLIC PANEL (CC13)
ALT: 19 U/L (ref 0–55)
Albumin: 3.6 g/dL (ref 3.5–5.0)
CO2: 30 mEq/L — ABNORMAL HIGH (ref 22–29)
Calcium: 9.7 mg/dL (ref 8.4–10.4)
Chloride: 98 mEq/L (ref 98–107)
Potassium: 4.3 mEq/L (ref 3.5–5.1)
Sodium: 134 mEq/L — ABNORMAL LOW (ref 136–145)
Total Bilirubin: 1.08 mg/dL (ref 0.20–1.20)
Total Protein: 6.7 g/dL (ref 6.4–8.3)

## 2013-03-23 LAB — CBC WITH DIFFERENTIAL/PLATELET
BASO%: 0.6 % (ref 0.0–2.0)
MCHC: 34.3 g/dL (ref 31.5–36.0)
MONO#: 0.1 10*3/uL (ref 0.1–0.9)
NEUT#: 2.9 10*3/uL (ref 1.5–6.5)
RBC: 3.91 10*6/uL (ref 3.70–5.45)
WBC: 4.5 10*3/uL (ref 3.9–10.3)
lymph#: 1.4 10*3/uL (ref 0.9–3.3)

## 2013-03-23 NOTE — Progress Notes (Signed)
OFFICE PROGRESS NOTE   03/23/2013   Physicians:D. ClarkePearson; J.Russo, T.Fontaine, [name], [name]. Last gastroenterologist in [place].    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  Dose dense taxol/ carboplatin day 1 cycle 1 was given on 03-20-13, with peripheral IV access not difficult.  She has been tired since the treatment but otherwise has tolerated without significant problems.  Oncologic History Patient transferred gyn care to Dr Colin Broach in Oct 2013 with unremarkable pelvic exam then and no PAP done due to last in 2011 and no prior abnormalities; she had been on HRT since age 30. She had ~ 2 weeks vaginal spotting in late Dec 2013, then saw Dr Audie Box in late Jan 2014 after bright red spotting that AM, with uterus larger than had been apparent in Oct. Sonohystogram 01-05-13 showed uterus normal size and echotexture, endometrium 4.3 mm, left ovary normal and right adnexa with 1.1 x 8.4.x8.2 cm cystic and solid mass. Endometrial biopsy benign and CA 125 also on 01-05-13 was 178.8. She had CT AP 01-17-13 with 1.0 x 6.9 x 8.9 cm complex right ovarian mass, no ascites, small retroperitoneal nodes. She was seen by [name] on 01-18-13 and taken to surgery by [name] on 02-13-13, which was TAH/BSO/ omentectomy/ureterolysis/ resection of cul-de-sac tumor/right pelvic lymphadnectomy and resection of rectum with reanastomosis. At completion of surgery there was no gross residual disease. 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 Wash[place] [phone]) had rare clusters of atypical cells. Patient was hospitalized from 3-18 thru 02-20-13, with no unexpected post operative problems; she continues lovenox planned for another week. She saw [name]  recommended dose dense taxol/ carboplatin, without avastin based on recent [facility] information that avastin is not additionally helpful after complete debulking.    She has had no nausea but has some aversion to pushing po fluids now, having heard recommendation for 64 oz daily, which is probably generous for her chemo. We have discussed incorporating sherbert, ice cream, jello, cantelope etc, and fine to drink 7-Up and tea as this is all that appeals now. She can eat solid food well. Bowels are moving ~ daily now. She has no neuropathy symptoms. She has intermittent discomfort RLQ since surgery, no other symptoms there. She has had no fever or symptoms of infection. She did not sleep after mid night steroid dose prior to first taxol; we will change the steroid premed to 12 hour prior dose only. Remainder of 10 point Review of Systems negative.  Objective:  Vital signs in last 24 hours:  BP 114/92  Pulse 61  Temp(Src) 98.1 F (36.7 C)  Resp 20  Ht 5\' 3"  (1.6 m)  Wt 135 lb (61.236 kg)  BMI 23.92 kg/m2  Weight is up 1 lb. Easily ambulatory, looks comfortable. No alopecia  HEENT:PERRLA, sclera clear, anicteric and oropharynx clear, no lesions LymphaticsCervical, supraclavicular, and axillary nodes normal. No inguinal adenopathy. Resp: clear to auscultation bilaterally and normal percussion bilaterally Cardio: regular rate and rhythm GI: soft, non-tender; bowel sounds normal; no masses,  no organomegaly Nothing palpable and no  tenderness now RLQ. No epigastric tenderness Extremities: extremities normal, atraumatic, no cyanosis or edema Neuro:no sensory deficits noted Breasts:normal without  suspicious masses, skin or nipple changes or axillary nodes  Some irregularities thruout without discreet mass No findings at site of chemo IV access RUE. Skin without rash or ecchymosis  Lab Results:  Results for orders placed in visit on 03/23/13  CBC WITH DIFFERENTIAL      Result Value Range   WBC 4.5  3.9 - 10.3 10e3/uL   NEUT# 2.9  1.5 - 6.5 10e3/uL   HGB 12.8  11.6 - 15.9 g/dL   HCT 96.0  45.4 - 09.8 %   Platelets 153  145 - 400 10e3/uL   MCV 95.2  79.5 - 101.0 fL   MCH 32.7  25.1 - 34.0 pg   MCHC 34.3  31.5 - 36.0 g/dL   RBC 1.19  1.47 - 8.29 10e6/uL   RDW 12.9  11.2 - 14.5 %   lymph# 1.4  0.9 - 3.3 10e3/uL   MONO# 0.1  0.1 - 0.9 10e3/uL   Eosinophils Absolute 0.0  0.0 - 0.5 10e3/uL   Basophils Absolute 0.0  0.0 - 0.1 10e3/uL   NEUT% 65.4  38.4 - 76.8 %   LYMPH% 31.7  14.0 - 49.7 %   MONO% 1.9  0.0 - 14.0 %   EOS% 0.4  0.0 - 7.0 %   BASO% 0.6  0.0 - 2.0 %  COMPREHENSIVE METABOLIC PANEL (CC13)      Result Value Range   Sodium 134 (*) 136 - 145 mEq/L   Potassium 4.3  3.5 - 5.1 mEq/L   Chloride 98  98 - 107 mEq/L   CO2 30 (*) 22 - 29 mEq/L   Glucose 92  70 - 99 mg/dl   BUN 56.2  7.0 - 13.0 mg/dL   Creatinine 0.7  0.6 - 1.1 mg/dL   Total Bilirubin 8.65  0.20 - 1.20 mg/dL   Alkaline Phosphatase 63  40 - 150 U/L   AST 18  5 - 34 U/L   ALT 19  0 - 55 U/L   Total Protein 6.7  6.4 - 8.3 g/dL   Albumin 3.6  3.5 - 5.0 g/dL   Calcium 9.7  8.4 - 78.4 mg/dL    CA 696 from [birth year] was 63, with preop CA 125  209.  Studies/Results:  No results found.  Medications: I have reviewed the patient's current medications. Change steroid premed for taxol to decadron 20 mg 12 hrs prior only. Change ativan night of chemo to prn only.  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 She will have day 8 and day 15 cycle 1 on 4-29 and 5-6 as long as ANC >=1.5 and plt >=100k days of rx, and I will see her with repeat counts, chemistries and CA 125 on 5-12 prior to day 1 cycle 2 on 04-10-13. 2.HTN, elevated lipids, one episode of paroxysmal Afib. On pradaxa and ASA.  3.hypothyroidism on replacement  4.osteoarthritis, post right hip replacement 2013  5.due colonoscopy 2015 (last 2005 [place])  Patient was comfortable with discussion and plan above.Time spent 25 min including >50% discussion. Chemo orders reviewed.   LIVESAY,LENNIS P, MD   03/23/2013, 8:22 PM

## 2013-03-23 NOTE — Patient Instructions (Signed)
Change premedication decadron (dexamethasone, steroid) to five tablets (=total 20 mg) 12 hours before chemo, with food.  (Do not need to take the 6 hour prior steroid dose)

## 2013-03-24 DIAGNOSIS — C561 Malignant neoplasm of right ovary: Secondary | ICD-10-CM | POA: Insufficient documentation

## 2013-03-27 ENCOUNTER — Ambulatory Visit (HOSPITAL_BASED_OUTPATIENT_CLINIC_OR_DEPARTMENT_OTHER): Payer: Medicare Other

## 2013-03-27 ENCOUNTER — Other Ambulatory Visit: Payer: Self-pay | Admitting: Oncology

## 2013-03-27 VITALS — BP 127/73 | HR 71 | Temp 97.3°F | Resp 16

## 2013-03-27 DIAGNOSIS — C569 Malignant neoplasm of unspecified ovary: Secondary | ICD-10-CM

## 2013-03-27 DIAGNOSIS — Z5111 Encounter for antineoplastic chemotherapy: Secondary | ICD-10-CM

## 2013-03-27 DIAGNOSIS — C792 Secondary malignant neoplasm of skin: Secondary | ICD-10-CM

## 2013-03-27 LAB — CBC WITH DIFFERENTIAL/PLATELET
BASO%: 0.2 % (ref 0.0–2.0)
LYMPH%: 11.4 % — ABNORMAL LOW (ref 14.0–49.7)
MCHC: 34.4 g/dL (ref 31.5–36.0)
MONO#: 0 10*3/uL — ABNORMAL LOW (ref 0.1–0.9)
Platelets: 158 10*3/uL (ref 145–400)
RBC: 3.74 10*6/uL (ref 3.70–5.45)
WBC: 3.7 10*3/uL — ABNORMAL LOW (ref 3.9–10.3)

## 2013-03-27 MED ORDER — SODIUM CHLORIDE 0.9 % IV SOLN
80.0000 mg/m2 | Freq: Once | INTRAVENOUS | Status: AC
  Administered 2013-03-27: 132 mg via INTRAVENOUS
  Filled 2013-03-27: qty 22

## 2013-03-27 MED ORDER — SODIUM CHLORIDE 0.9 % IV SOLN
Freq: Once | INTRAVENOUS | Status: AC
  Administered 2013-03-27: 10:00:00 via INTRAVENOUS

## 2013-03-27 MED ORDER — ONDANSETRON 8 MG/50ML IVPB (CHCC)
8.0000 mg | Freq: Once | INTRAVENOUS | Status: AC
  Administered 2013-03-27: 8 mg via INTRAVENOUS

## 2013-03-27 MED ORDER — DEXAMETHASONE SODIUM PHOSPHATE 4 MG/ML IJ SOLN
20.0000 mg | Freq: Once | INTRAMUSCULAR | Status: AC
  Administered 2013-03-27: 20 mg via INTRAVENOUS

## 2013-03-27 MED ORDER — FAMOTIDINE IN NACL 20-0.9 MG/50ML-% IV SOLN
20.0000 mg | Freq: Once | INTRAVENOUS | Status: AC
  Administered 2013-03-27: 20 mg via INTRAVENOUS

## 2013-03-27 MED ORDER — DIPHENHYDRAMINE HCL 50 MG/ML IJ SOLN
50.0000 mg | Freq: Once | INTRAMUSCULAR | Status: AC
  Administered 2013-03-27: 50 mg via INTRAVENOUS

## 2013-03-27 NOTE — Patient Instructions (Addendum)
[facility] Discharge Instructions for Patients Receiving Chemotherapy  Today you received the following chemotherapy agents Taxol.  To help prevent nausea and vomiting after your treatment, we encourage you to take your nausea medication.   If you develop nausea and vomiting that is not controlled by your nausea medication, call the clinic. If it is after clinic hours your family physician or the after hours number for the clinic or go to the Emergency Department.   BELOW ARE SYMPTOMS THAT SHOULD BE REPORTED IMMEDIATELY:  *FEVER GREATER THAN 100.5 F  *CHILLS WITH OR WITHOUT FEVER  NAUSEA AND VOMITING THAT IS NOT CONTROLLED WITH YOUR NAUSEA MEDICATION  *UNUSUAL SHORTNESS OF BREATH  *UNUSUAL BRUISING OR BLEEDING  TENDERNESS IN MOUTH AND THROAT WITH OR WITHOUT PRESENCE OF ULCERS  *URINARY PROBLEMS  *BOWEL PROBLEMS  UNUSUAL RASH Items with * indicate a potential emergency and should be followed up as soon as possible.  One of the nurses will contact you 24 hours after your treatment. Please let the nurse know about any problems that you may have experienced. Feel free to call the clinic you have any questions or concerns. The clinic phone number is [phone].   I have been informed and understand all the instructions given to me. I know to contact the clinic, my physician, or go to the Emergency Department if any problems should occur. I do not have any questions at this time, but understand that I may call the clinic during office hours   should I have any questions or need assistance in obtaining follow up care.    __________________________________________  _____________  __________ Signature of Patient or Authorized Representative            Date                   Time    __________________________________________ Nurse's Signature    

## 2013-04-03 ENCOUNTER — Encounter: Payer: Self-pay | Admitting: Oncology

## 2013-04-03 ENCOUNTER — Other Ambulatory Visit: Payer: Self-pay | Admitting: Oncology

## 2013-04-03 ENCOUNTER — Telehealth: Payer: Self-pay | Admitting: Oncology

## 2013-04-03 ENCOUNTER — Encounter: Payer: Self-pay | Admitting: Gynecology

## 2013-04-03 ENCOUNTER — Other Ambulatory Visit (HOSPITAL_BASED_OUTPATIENT_CLINIC_OR_DEPARTMENT_OTHER): Payer: Medicare Other | Admitting: Lab

## 2013-04-03 ENCOUNTER — Ambulatory Visit: Payer: Medicare Other | Admitting: Oncology

## 2013-04-03 ENCOUNTER — Ambulatory Visit: Payer: Medicare Other | Attending: Gynecology | Admitting: Gynecology

## 2013-04-03 ENCOUNTER — Ambulatory Visit (HOSPITAL_BASED_OUTPATIENT_CLINIC_OR_DEPARTMENT_OTHER): Payer: Medicare Other

## 2013-04-03 VITALS — BP 130/70 | HR 100 | Temp 97.7°F | Resp 20 | Ht 62.99 in | Wt 135.4 lb

## 2013-04-03 VITALS — BP 123/64 | HR 66 | Temp 97.7°F

## 2013-04-03 DIAGNOSIS — C792 Secondary malignant neoplasm of skin: Secondary | ICD-10-CM

## 2013-04-03 DIAGNOSIS — R971 Elevated cancer antigen 125 [CA 125]: Secondary | ICD-10-CM | POA: Insufficient documentation

## 2013-04-03 DIAGNOSIS — C561 Malignant neoplasm of right ovary: Secondary | ICD-10-CM

## 2013-04-03 DIAGNOSIS — C569 Malignant neoplasm of unspecified ovary: Secondary | ICD-10-CM

## 2013-04-03 DIAGNOSIS — Z5111 Encounter for antineoplastic chemotherapy: Secondary | ICD-10-CM

## 2013-04-03 LAB — CBC WITH DIFFERENTIAL/PLATELET
BASO%: 0 % (ref 0.0–2.0)
HCT: 32.5 % — ABNORMAL LOW (ref 34.8–46.6)
MCHC: 34.8 g/dL (ref 31.5–36.0)
MONO#: 0 10*3/uL — ABNORMAL LOW (ref 0.1–0.9)
NEUT%: 65.1 % (ref 38.4–76.8)
RBC: 3.53 10*6/uL — ABNORMAL LOW (ref 3.70–5.45)
WBC: 1.7 10*3/uL — ABNORMAL LOW (ref 3.9–10.3)
lymph#: 0.6 10*3/uL — ABNORMAL LOW (ref 0.9–3.3)
nRBC: 0 % (ref 0–0)

## 2013-04-03 MED ORDER — DIPHENHYDRAMINE HCL 50 MG/ML IJ SOLN
50.0000 mg | Freq: Once | INTRAMUSCULAR | Status: AC
  Administered 2013-04-03: 50 mg via INTRAVENOUS

## 2013-04-03 MED ORDER — DEXAMETHASONE SODIUM PHOSPHATE 20 MG/5ML IJ SOLN
20.0000 mg | Freq: Once | INTRAMUSCULAR | Status: AC
  Administered 2013-04-03: 20 mg via INTRAVENOUS

## 2013-04-03 MED ORDER — FAMOTIDINE IN NACL 20-0.9 MG/50ML-% IV SOLN
20.0000 mg | Freq: Once | INTRAVENOUS | Status: AC
  Administered 2013-04-03: 20 mg via INTRAVENOUS

## 2013-04-03 MED ORDER — SODIUM CHLORIDE 0.9 % IV SOLN
132.0000 mg | Freq: Once | INTRAVENOUS | Status: AC
  Administered 2013-04-03: 132 mg via INTRAVENOUS
  Filled 2013-04-03: qty 22

## 2013-04-03 MED ORDER — PACLITAXEL CHEMO INJECTION 300 MG/50ML: 80.0000 mg/m2 | Freq: Once | INTRAVENOUS | Status: DC

## 2013-04-03 MED ORDER — ONDANSETRON 8 MG/50ML IVPB (CHCC)
8.0000 mg | Freq: Once | INTRAVENOUS | Status: AC
  Administered 2013-04-03: 8 mg via INTRAVENOUS

## 2013-04-03 MED ORDER — SODIUM CHLORIDE 0.9 % IV SOLN
Freq: Once | INTRAVENOUS | Status: AC
  Administered 2013-04-03: 10:00:00 via INTRAVENOUS

## 2013-04-03 NOTE — Progress Notes (Signed)
Consult Note: Gyn-Onc   [name] 72 y.o. female  Chief Complaint  Patient presents with  . Ovarian Cancer    Follow up    Assessment : Stage IIb poorly differentiated ovarian cancer. From a surgical point of view the patient is doing well and is given the okay to return to full levels of activity.  Plan: Stage II B. poorly differentiated ovarian cancer status post optimal debulking and staging.She will continue under the care of Dr. Darrold Span for chemotherapy. She returned to see me after 4 cycles of chemotherapy.   Interval History:  the patient returns for her first postoperative checkup. She has had an uncomplicated postoperative course and has now completed one cycle of dose dense carboplatin and Taxol chemotherapy. She seems to be tolerating chemotherapy well. From a surgical point of view, she has normal GI and GU function. She denies any abdominal pain. Her functional status is nearly 100%.   HPI:The patient initially presented with a pelvic mass and elevated CA 125 (178 units per mL and (she underwent exploratory laparotomy and debulking on 02/13/2013. Final pathology showed a poorly differentiated ovarian cancer involving both ovaries pelvic peritoneum and rectal muscularis. All gross disease was resected.   Review of Systems:10 point review of systems is negative except as noted in interval history.   Vitals: Blood pressure 130/70, pulse 100, temperature 97.7 F (36.5 C), resp. rate 20, height 5' 2.99" (1.6 m), weight 135 lb 6.4 oz (61.417 kg).  Physical Exam: General : The patient is a healthy woman in no acute distress.  HEENT: normocephalic, extraoccular movements normal; neck is supple without thyromegally  Lynphnodes: Supraclavicular and inguinal nodes not enlarged  Abdomen: Soft, non-tender, no ascites, no organomegally, no masses, no hernias , midline incision is healing well  Pelvic:  deferred    Lower extremities: No edema or varicosities. Normal range of  motion      Allergies  Allergen Reactions  . Codeine Other (See Comments)    Does not like the feeling she gets  . Lisinopril     LIP NUMBNESS    Past Medical History  Diagnosis Date  . Hypertension   . Dyslipidemia   . Hypothyroidism   . Arthritis     OSTEOARTHRITIS   -- CONSTANT PAIN RIGHT HIP---AND PAIN LEFT KNEE--PT STATES SHE GETS INJECTIONS INTO HER KNEE  . Dysrhythmia     HX OF PAROXYSMAL ATRIAL FIB  . Complication of anesthesia     BLOOD PRESSURE DROPPED WITH NASAL SURGERY, ONE OF THE CARPAL TUNNEL REPAIRS AND DURING A COLONOSCOPY    Past Surgical History  Procedure Laterality Date  . Bilateral carpal tunnel repair  2007  . Dilation and curettage of uterus  1969  . Surgery for ruptured ovarian cyst  1969  . Rhinoplasty for fractured nose  1986  . Left knee arthroscopy   2011  . Total hip arthroplasty  02/25/2012    Procedure: TOTAL HIP ARTHROPLASTY ANTERIOR APPROACH;  Surgeon: Kathryne Hitch, MD;  Location: [facility];  Service: Orthopedics;  Laterality: Right;  . Tonsillectomy  1962  . Laparotomy Bilateral 02/13/2013    Procedure: EXPLORATORY LAPAROTOMY TOTAL ABDOMINAL HYSTERECTOMY BILATERAL SALPINGO-OOPHORECTOMY, Partial Rectal Resection with Reanastamosis;  Surgeon: Jeannette Corpus, MD;  Location: [facility];  Service: Gynecology;  Laterality: Bilateral;  . Omentectomy  02/13/2013    Procedure: OMENTECTOMY;  Surgeon: Jeannette Corpus, MD;  Location: [facility];  Service: Gynecology;;  . Lymphadenectomy Right 02/13/2013    Procedure: PEVLIC  LYMPHADENECTOMY, DEBULKING right pelvic tumor nodules;  Surgeon: Jeannette Corpus, MD;  Location: [facility];  Service: Gynecology;  Laterality: Right;    Current Outpatient Prescriptions  Medication Sig Dispense Refill  . aspirin 81 MG tablet Take 81 mg by mouth daily.      . bisoprolol (ZEBETA) 5 MG tablet Take 5 mg by mouth 2 (two) times daily.      . clopidogrel (PLAVIX) 75 MG tablet Take 75 mg by mouth  every evening.       Marland Kitchen dexamethasone (DECADRON) 4 MG tablet 5 tablets = 20 mg with food. Take 12 hours and 6 hours prior to taxol or as directed.  30 tablet  0  . estradiol (ESTRACE) 1 MG tablet Take 1 tablet (1 mg total) by mouth every morning. PT STATES SHE WANTS ESTRACE--NOT THE GENERIC  90 tablet  3  . Fiber POWD Take by mouth daily.      . fish oil-omega-3 fatty acids 1000 MG capsule Take 1 g by mouth every morning.      Marland Kitchen GLUCOSAMINE HCL PO Take 1,000 mg by mouth daily.      . hydrALAZINE (APRESOLINE) 25 MG tablet Take 25 mg by mouth 2 (two) times daily.      . hydrochlorothiazide (MICROZIDE) 12.5 MG capsule Take 12.5 mg by mouth every morning.      Marland Kitchen KLOR-CON 10 10 MEQ tablet       . levothyroxine (SYNTHROID, LEVOTHROID) 88 MCG tablet Take 88 mcg by mouth every morning. PT STATES SHE NEEDS THE SYNTHROID--DOES NOT WANT THE GENERIC      . losartan (COZAAR) 50 MG tablet Take 50 mg by mouth every morning.      . Multiple Vitamin (MULITIVITAMIN WITH MINERALS) TABS Take 1 tablet by mouth every morning.      . polyethylene glycol (MIRALAX / GLYCOLAX) packet Take 17 g by mouth daily.      . propafenone (RYTHMOL) 225 MG tablet Take 225 mg by mouth 2 (two) times daily. 9am9pm      . LORazepam (ATIVAN) 1 MG tablet 1/2-1 tablet under tongue or by mouth every 4-6 hours as needed for nausea. Will make you sleepy  30 tablet  0  . ondansetron (ZOFRAN) 8 MG tablet 1-2 tablets every 12 hours as needed for nausea. Will not make sleepy.  30 tablet  1   No current facility-administered medications for this visit.    History   Social History  . Marital Status: Married    Spouse Name: N/A    Number of Children: N/A  . Years of Education: N/A   Occupational History  . Not on file.   Social History Main Topics  . Smoking status: Never Smoker   . Smokeless tobacco: Never Used  . Alcohol Use: No  . Drug Use: No  . Sexually Active: Not on file   Other Topics Concern  . Not on file   Social  History Narrative  . No narrative on file    Family History  Problem Relation Age of Onset  . Hypertension Mother       Jeannette Corpus, MD 04/03/2013, 2:11 PM

## 2013-04-03 NOTE — Progress Notes (Signed)
Ok to proceed with treatment today despite WBC 1.7, ANC 1.1, verbal order received and read back from Dr. Darrold Span, pt to receive neupogen tomorrow.  Informed pt of this, and she verbalizes understanding.  Pt states she feels fine, had some fatigue previously, but she feels better now.

## 2013-04-03 NOTE — Telephone Encounter (Signed)
GV AND PRINTED APPT SCHED AND AVS FOR PT FOR mAY °

## 2013-04-03 NOTE — Patient Instructions (Signed)
Continue chemotherapy as scheduled. Return to see me after your fourth cycle of chemotherapy.

## 2013-04-03 NOTE — Progress Notes (Signed)
ENCOUNTER OPENED ON WRONG PATIENT.  L.LIVESAY, MD

## 2013-04-03 NOTE — Progress Notes (Signed)
Discharged ambulatory to lab then going home.  Her sister came upon discharge and with her now.

## 2013-04-04 ENCOUNTER — Ambulatory Visit (HOSPITAL_BASED_OUTPATIENT_CLINIC_OR_DEPARTMENT_OTHER): Payer: Medicare Other

## 2013-04-04 VITALS — BP 124/68 | HR 68 | Temp 98.2°F

## 2013-04-04 DIAGNOSIS — C569 Malignant neoplasm of unspecified ovary: Secondary | ICD-10-CM

## 2013-04-04 DIAGNOSIS — Z5189 Encounter for other specified aftercare: Secondary | ICD-10-CM

## 2013-04-04 DIAGNOSIS — C561 Malignant neoplasm of right ovary: Secondary | ICD-10-CM

## 2013-04-04 DIAGNOSIS — C792 Secondary malignant neoplasm of skin: Secondary | ICD-10-CM

## 2013-04-04 MED ORDER — FILGRASTIM 300 MCG/0.5ML IJ SOLN
300.0000 ug | Freq: Once | INTRAMUSCULAR | Status: AC
  Administered 2013-04-04: 300 ug via SUBCUTANEOUS
  Filled 2013-04-04: qty 0.5

## 2013-04-04 NOTE — Patient Instructions (Addendum)
Filgrastim, G-CSF injection What is this medicine? FILGRASTIM, G-CSF (fil GRA stim) stimulates the formation of white blood cells. This medicine is given to patients with conditions that may cause a decrease in white blood cells, like those receiving certain types of chemotherapy or bone marrow transplant. It helps the bone marrow recover its ability to produce white blood cells. Increasing the amount of white blood cells helps to decrease the risk of infection and fever. This medicine may be used for other purposes; ask your health care provider or pharmacist if you have questions. What should I tell my health care provider before I take this medicine? They need to know if you have any of these conditions: -currently receiving radiation therapy -sickle cell disease -an unusual or allergic reaction to filgrastim, E. coli protein, other medicines, foods, dyes, or preservatives -pregnant or trying to get pregnant -breast-feeding How should I use this medicine? This medicine is for injection into a vein or injection under the skin. It is usually given by a health care professional in a hospital or clinic setting. If you get this medicine at home, you will be taught how to prepare and give this medicine. Always change the site for the injection under the skin. Let the solution warm to room temperature before you use it. Do not shake the solution before you withdraw a dose. Throw away any unused portion. Use exactly as directed. Take your medicine at regular intervals. Do not take your medicine more often than directed. It is important that you put your used needles and syringes in a special sharps container. Do not put them in a trash can. If you do not have a sharps container, call your pharmacist or healthcare provider to get one. Talk to your pediatrician regarding the use of this medicine in children. While this medicine may be prescribed for children for selected conditions, precautions do  apply. Overdosage: If you think you have taken too much of this medicine contact a poison control center or emergency room at once. NOTE: This medicine is only for you. Do not share this medicine with others. What if I miss a dose? Try not to miss doses. If you miss a dose take the dose as soon as you remember. If it is almost time for the next dose, do not take double doses unless told to by your doctor or health care professional. What may interact with this medicine? -lithium -medicines for cancer chemotherapy This list may not describe all possible interactions. Give your health care provider a list of all the medicines, herbs, non-prescription drugs, or dietary supplements you use. Also tell them if you smoke, drink alcohol, or use illegal drugs. Some items may interact with your medicine. What should I watch for while using this medicine? Visit your doctor or health care professional for regular checks on your progress. If you get a fever or any sign of infection while you are using this medicine, do not treat yourself. Check with your doctor or health care professional. Bone pain can usually be relieved by mild pain relievers such as acetaminophen or ibuprofen. Check with your doctor or health care professional before taking these medicines as they may hide a fever. Call your doctor or health care professional if the aches and pains are severe or do not go away. What side effects may I notice from receiving this medicine? Side effects that you should report to your doctor or health care professional as soon as possible: -allergic reactions like skin rash, itching  or hives, swelling of the face, lips, or tongue -difficulty breathing, wheezing -fever -pain, redness, or swelling at the injection site -stomach or side pain, or pain at the shoulder Side effects that usually do not require medical attention (report to your doctor or health care professional if they continue or are  bothersome): -bone pain (ribs, lower back, breast bone) -headache -skin rash This list may not describe all possible side effects. Call your doctor for medical advice about side effects. You may report side effects to FDA at 1-800-FDA-1088. Where should I keep my medicine? Keep out of the reach of children. Store in a refrigerator between 2 and 8 degrees C (36 and 46 degrees F). Do not freeze or leave in direct sunlight. If vials or syringes are left out of the refrigerator for more than 24 hours, they must be thrown away. Throw away unused vials after the expiration date on the carton. NOTE: This sheet is a summary. It may not cover all possible information. If you have questions about this medicine, talk to your doctor, pharmacist, or health care provider.  2013, [employer]. (01/31/2008 1:33:21 PM)

## 2013-04-06 ENCOUNTER — Telehealth: Payer: Self-pay

## 2013-04-06 DIAGNOSIS — E039 Hypothyroidism, unspecified: Secondary | ICD-10-CM

## 2013-04-06 NOTE — Telephone Encounter (Signed)
Returning patient call regarding slight nasal bleeding with blowing nose and to discuss other questions she would like to discuss.  Left message for [name] to call back to discuss concerns.

## 2013-04-06 NOTE — Telephone Encounter (Signed)
Ms. Severs is experiencing some blood on tissue after blowing her nose the last couple of days.  Platelet count was 166 k on 04-03-13. Nares are slightly dry.  She will use saline nose spray to see if this helps decrease bleeding. If bleeding gets worse, she is to call the office and  hold her ASA  81 mg.  Will evaluate at visit 04-09-13. Pt. Experiencing increased fatigue with this treatment.  Told her this will occur with cumulative effect of chemotherapy.  Encouraged her to walk /exercise some as this can help with the fatigue. She will have a TSH and T4 done at our office 04-09-13 and copies sen to PCP Dr. Creola Corn. Pt. Experiencing increased constipation. Suggested  4 oz of warm prune juice daily.   Suggested she take miralax bid and use MOM tabs if no results after second 17 g dose today.   Encouraged increasing fluid intake with jello, ice cream, or drinks that appeal to her as water does not.

## 2013-04-09 ENCOUNTER — Telehealth: Payer: Self-pay | Admitting: Oncology

## 2013-04-09 ENCOUNTER — Encounter: Payer: Self-pay | Admitting: Oncology

## 2013-04-09 ENCOUNTER — Other Ambulatory Visit: Payer: Self-pay | Admitting: Oncology

## 2013-04-09 ENCOUNTER — Other Ambulatory Visit (HOSPITAL_BASED_OUTPATIENT_CLINIC_OR_DEPARTMENT_OTHER): Payer: Medicare Other | Admitting: Lab

## 2013-04-09 ENCOUNTER — Ambulatory Visit (HOSPITAL_BASED_OUTPATIENT_CLINIC_OR_DEPARTMENT_OTHER): Payer: Medicare Other | Admitting: Oncology

## 2013-04-09 VITALS — BP 104/59 | HR 65 | Temp 97.2°F | Resp 18 | Ht 62.99 in | Wt 135.6 lb

## 2013-04-09 DIAGNOSIS — C569 Malignant neoplasm of unspecified ovary: Secondary | ICD-10-CM

## 2013-04-09 DIAGNOSIS — I10 Essential (primary) hypertension: Secondary | ICD-10-CM

## 2013-04-09 DIAGNOSIS — C561 Malignant neoplasm of right ovary: Secondary | ICD-10-CM

## 2013-04-09 DIAGNOSIS — C7982 Secondary malignant neoplasm of genital organs: Secondary | ICD-10-CM

## 2013-04-09 DIAGNOSIS — E039 Hypothyroidism, unspecified: Secondary | ICD-10-CM

## 2013-04-09 LAB — CBC WITH DIFFERENTIAL/PLATELET
BASO%: 1.1 % (ref 0.0–2.0)
Basophils Absolute: 0 10*3/uL (ref 0.0–0.1)
HCT: 30.3 % — ABNORMAL LOW (ref 34.8–46.6)
HGB: 10.4 g/dL — ABNORMAL LOW (ref 11.6–15.9)
LYMPH%: 74 % — ABNORMAL HIGH (ref 14.0–49.7)
MCH: 31.9 pg (ref 25.1–34.0)
MCV: 92.9 fL (ref 79.5–101.0)
MONO%: 6.6 % (ref 0.0–14.0)
NEUT%: 17.9 % — ABNORMAL LOW (ref 38.4–76.8)
Platelets: 122 10*3/uL — ABNORMAL LOW (ref 145–400)
lymph#: 2 10*3/uL (ref 0.9–3.3)
nRBC: 0 % (ref 0–0)

## 2013-04-09 LAB — COMPREHENSIVE METABOLIC PANEL (CC13)
AST: 16 U/L (ref 5–34)
BUN: 14.8 mg/dL (ref 7.0–26.0)
CO2: 30 mEq/L — ABNORMAL HIGH (ref 22–29)
Calcium: 9 mg/dL (ref 8.4–10.4)
Chloride: 102 mEq/L (ref 98–107)
Creatinine: 0.6 mg/dL (ref 0.6–1.1)
Total Bilirubin: 0.42 mg/dL (ref 0.20–1.20)

## 2013-04-09 LAB — TSH: TSH: 2.623 u[IU]/mL (ref 0.350–4.500)

## 2013-04-09 MED ORDER — FILGRASTIM 300 MCG/0.5ML IJ SOLN
300.0000 ug | Freq: Once | INTRAMUSCULAR | Status: AC
  Administered 2013-04-09: 300 ug via SUBCUTANEOUS
  Filled 2013-04-09: qty 0.5

## 2013-04-09 NOTE — Patient Instructions (Signed)
Neutropenic precautions  Call for temperature >=100.5 while absolute neutrophil count is less than 1.0.  Stop HCTZ 12.5 mg daily. Stop losartan 50 mg daily Continue hydralazine 25 mg twice daily.

## 2013-04-09 NOTE — Progress Notes (Signed)
OFFICE PROGRESS NOTE   04/09/2013   Physicians:D. ClarkePearson; J.Russo, T.Fontaine, [name], [name]. Last gastroenterologist in [place].   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 She had day 15 cycle 1 taxol on 04-03-13 with ANC 1.1, then neupogen on 04-04-13; she is neutropenic with ANC 0.5 today. She has also had some constipation and was fatigued for 2-3 days after last chemo.   Oncologic History  Patient transferred gyn care to Dr Colin Broach in Oct 2013 with unremarkable pelvic exam then and no PAP done due to last in 2011 and no prior abnormalities; she had been on HRT since age 10. She had ~ 2 weeks vaginal spotting in late Dec 2013, then saw Dr Audie Box in late Jan 2014 after bright red spotting that AM, with uterus larger than had been apparent in Oct. Sonohystogram 01-05-13 showed uterus normal size and echotexture, endometrium 4.3 mm, left ovary normal and right adnexa with 1.1 x 8.4.x8.2 cm cystic and solid mass. Endometrial biopsy benign and CA 125 also on 01-05-13 was 178.8. She had CT AP 01-17-13 with 1.0 x 6.9 x 8.9 cm complex right ovarian mass, no ascites, small retroperitoneal nodes. She was seen by [name] on 01-18-13 and taken to surgery by [name] on 02-13-13, which was TAH/BSO/ omentectomy/ureterolysis/ resection of cul-de-sac tumor/right pelvic lymphadnectomy and resection of rectum with reanastomosis. At completion of surgery there was no gross residual disease. 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 Washings [id number]) had rare clusters of atypical cells. Chemotherapy with dose dense taxol carboplatin was begun day 1 cycle 1 on 03-20-13; ANC was 1.1 on day 15 cycle 1 with taxol given and neupogen added 04-04-13.   Bowels moving better with continued miralax and she will increase the miralax after each treatment. No fever or symptoms of infection; she has had instruction on neutropenic precaustions by MD and by RN today. Slight epistaxis improved with regular use of saline nasal spray, no other bleeding. No abdominal pain. No bladder symptoms. No SOB. No peripheral neuropathy symptoms. Peripheral IV access easily accomplished. She has not checked BP at home, systolic only 104 today on HCTZ, losartan and hydralazine. Chronic arthritis is much better on the steroids used premed for taxol. Remainder of 10 point Review of Systems negative.  Objective:  Vital signs in last 24 hours:  BP 104/59  Pulse 65  Temp(Src) 97.2 F (36.2 C) (Oral)  Resp 18  Ht 5' 2.99" (1.6 m)  Wt 135 lb 9.6 oz (61.508 kg)  BMI 24.03 kg/m2  BPs over last couple of weeks have been 120's systolic primarily. Alopecia. Easily ambulatory, looks comfortable, respirations not labored RA. Weight is stable.  HEENT:PERRLA, sclera clear, anicteric and oropharynx clear, no lesions LymphaticsCervical, supraclavicular, and axillary nodes normal. No inguinal adenopathy Resp: clear to auscultation bilaterally and normal percussion bilaterally Cardio: regular rate and rhythm GI: soft, nontender, midline incision closed and unremarkable, nontender. No appreciable HSM or mass Extremities: extremities normal, atraumatic, no cyanosis or edema Neuro:no sensory deficits noted Skin without rash or ecchymosis.  Lab Results:  Results for orders placed in visit on 04/09/13  CBC WITH DIFFERENTIAL      Result Value Range   WBC 2.7 (*) 3.9 - 10.3 10e3/uL   NEUT#  0.5 (*) 1.5 - 6.5 10e3/uL   HGB 10.4 (*) 11.6 - 15.9 g/dL   HCT 95.6 (*) 21.3 - 08.6 %   Platelets 122 (*) 145 - 400 10e3/uL   MCV 92.9  79.5 - 101.0 fL   MCH 31.9  25.1 - 34.0 pg   MCHC 34.3  31.5 - 36.0 g/dL   RBC 5.78 (*) 4.69 - 6.29 10e6/uL   RDW 12.9  11.2 - 14.5 %   lymph# 2.0  0.9 - 3.3 10e3/uL   MONO# 0.2  0.1 - 0.9 10e3/uL   Eosinophils Absolute 0.0  0.0 - 0.5 10e3/uL   Basophils Absolute 0.0  0.0 - 0.1 10e3/uL   NEUT% 17.9 (*) 38.4 - 76.8 %   LYMPH% 74.0 (*) 14.0 - 49.7 %   MONO% 6.6  0.0 - 14.0 %   EOS% 0.4  0.0 - 7.0 %   BASO% 1.1  0.0 - 2.0 %   nRBC 0  0 - 0 %     Studies/Results:  No results found.  Medications: I have reviewed the patient's current medications. She is given neupogen 300 mcg today and will have this also on 5-13; we will check CBC on 5-14 and continue neupogen then if ANC < ~1.2.  I will add neupogen on days 01-07-15 for cycle 2. I will decrease carboplatin dose slightly for cycle 2. We will hold chemo this week and resume with day 1 cycle 2 on  04-17-13.  I have discussed BP directly with Dr Timothy Lasso now. Will stop HCTZ 12.5 mg daily and losartan 50 mg daily; she will continue hydralazine 25 mg bid for now. Note steroid premedication is decadron 20 mg 12 hrs prior to taxol.   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 Delay cycle 2 by one week with neutropenia now, add neupogen as above. 2.HTN, elevated lipids, one episode of paroxysmal Afib. On pradaxa and ASA. With lower BP, will hold HCTZ and losartan now; appreciate Dr Ferd Hibbs assistance. 3.hypothyroidism on replacement  4.osteoarthritis, post right hip replacement 2013. Symptoms better with steroids used premed for taxol weekly. 5.due colonoscopy 2015 (last 2005  [place])     Reece Packer, MD   04/09/2013, 2:26 PM

## 2013-04-09 NOTE — Telephone Encounter (Signed)
gv adn printed appt sched and avs for pt....emailed MW to add tx.Marland KitchenMarland KitchenDr Cleophas Dunker stated that she will handle 5.27/28 chem.Marland KitchenMarland Kitchen

## 2013-04-10 ENCOUNTER — Telehealth: Payer: Self-pay | Admitting: *Deleted

## 2013-04-10 ENCOUNTER — Ambulatory Visit: Payer: Medicare Other

## 2013-04-10 ENCOUNTER — Ambulatory Visit (HOSPITAL_BASED_OUTPATIENT_CLINIC_OR_DEPARTMENT_OTHER): Payer: Medicare Other

## 2013-04-10 VITALS — BP 114/58 | HR 67 | Temp 97.8°F | Resp 20

## 2013-04-10 DIAGNOSIS — C569 Malignant neoplasm of unspecified ovary: Secondary | ICD-10-CM

## 2013-04-10 DIAGNOSIS — C561 Malignant neoplasm of right ovary: Secondary | ICD-10-CM

## 2013-04-10 DIAGNOSIS — Z5189 Encounter for other specified aftercare: Secondary | ICD-10-CM

## 2013-04-10 MED ORDER — FILGRASTIM 300 MCG/0.5ML IJ SOLN
300.0000 ug | Freq: Once | INTRAMUSCULAR | Status: AC
  Administered 2013-04-10: 300 ug via SUBCUTANEOUS
  Filled 2013-04-10: qty 0.5

## 2013-04-10 NOTE — Telephone Encounter (Signed)
Per staff message and POF I have scheduled appts.  JMW  

## 2013-04-11 ENCOUNTER — Ambulatory Visit: Payer: Medicare Other

## 2013-04-11 ENCOUNTER — Other Ambulatory Visit (HOSPITAL_BASED_OUTPATIENT_CLINIC_OR_DEPARTMENT_OTHER): Payer: Medicare Other | Admitting: Lab

## 2013-04-11 DIAGNOSIS — C569 Malignant neoplasm of unspecified ovary: Secondary | ICD-10-CM

## 2013-04-11 DIAGNOSIS — C561 Malignant neoplasm of right ovary: Secondary | ICD-10-CM

## 2013-04-11 LAB — CBC WITH DIFFERENTIAL/PLATELET
Basophils Absolute: 0 10*3/uL (ref 0.0–0.1)
EOS%: 0.3 % (ref 0.0–7.0)
HGB: 10.5 g/dL — ABNORMAL LOW (ref 11.6–15.9)
MCH: 32.9 pg (ref 25.1–34.0)
MONO#: 0.5 10*3/uL (ref 0.1–0.9)
NEUT#: 4 10*3/uL (ref 1.5–6.5)
RDW: 13.4 % (ref 11.2–14.5)
WBC: 6.1 10*3/uL (ref 3.9–10.3)
lymph#: 1.6 10*3/uL (ref 0.9–3.3)

## 2013-04-11 NOTE — Progress Notes (Signed)
Labs checked and ANC 4.0 today.  Neupogen held if ANC >1.2.  Will recheck CBC on Monday 04/16/13, day prior to planned chemotherapy.

## 2013-04-13 ENCOUNTER — Ambulatory Visit (HOSPITAL_BASED_OUTPATIENT_CLINIC_OR_DEPARTMENT_OTHER): Payer: Medicare Other | Admitting: Lab

## 2013-04-13 ENCOUNTER — Other Ambulatory Visit: Payer: Self-pay | Admitting: Physician Assistant

## 2013-04-13 ENCOUNTER — Other Ambulatory Visit: Payer: Self-pay

## 2013-04-13 ENCOUNTER — Telehealth: Payer: Self-pay

## 2013-04-13 DIAGNOSIS — N390 Urinary tract infection, site not specified: Secondary | ICD-10-CM

## 2013-04-13 DIAGNOSIS — C569 Malignant neoplasm of unspecified ovary: Secondary | ICD-10-CM

## 2013-04-13 DIAGNOSIS — R300 Dysuria: Secondary | ICD-10-CM

## 2013-04-13 LAB — URINALYSIS, MICROSCOPIC - CHCC
Bilirubin (Urine): NEGATIVE
Ketones: NEGATIVE mg/dL
pH: 7.5 (ref 4.6–8.0)

## 2013-04-13 LAB — CBC WITH DIFFERENTIAL/PLATELET
Basophils Absolute: 0 10*3/uL (ref 0.0–0.1)
Eosinophils Absolute: 0 10*3/uL (ref 0.0–0.5)
HCT: 29.9 % — ABNORMAL LOW (ref 34.8–46.6)
HGB: 10.3 g/dL — ABNORMAL LOW (ref 11.6–15.9)
MCV: 93.7 fL (ref 79.5–101.0)
MONO%: 10.5 % (ref 0.0–14.0)
NEUT#: 3.5 10*3/uL (ref 1.5–6.5)
RDW: 13.8 % (ref 11.2–14.5)
lymph#: 1.6 10*3/uL (ref 0.9–3.3)

## 2013-04-13 MED ORDER — CIPROFLOXACIN HCL 250 MG PO TABS: 250.0000 mg | ORAL_TABLET | Freq: Two times a day (BID) | ORAL | Status: DC

## 2013-04-13 MED ORDER — PHENAZOPYRIDINE HCL 100 MG PO TABS: 100.0000 mg | ORAL_TABLET | Freq: Three times a day (TID) | ORAL | Status: DC | PRN

## 2013-04-13 NOTE — Telephone Encounter (Signed)
Told Ms. Manfred that the physician assistant would like to see the urine culture results prior to prescribing an ATB.  Will send pyridium for her to use tid for 3 days until the culture results on Monday 04-16-13.  Told Ms. Rennaker the pyridium turns urine orange and can stain under garments.

## 2013-04-13 NOTE — Telephone Encounter (Signed)
[name] is calling with symptoms of urinary burning.  Onset yesterday.  Increased fluid intake has decreased burning slightly. Told [name] that she needs to come in for a UA/ C&S today and a cbc as she was neutopenic earlier this week per [name] PA-C

## 2013-04-13 NOTE — Telephone Encounter (Signed)
Ms. Zaugg called stating that the urinary symptoms are getting worse.  She has used one pyridium with no effect.  Afebrile at 98.6.   She will end up in the ED if she keeps going like this over the weekend. Told her that The Physician assistant ordered Cipro 250 mg bid for 3 days.  Culture Pending. Pt. to call the doctor on call  if she develops a temp of 101.5 or greater.

## 2013-04-16 ENCOUNTER — Other Ambulatory Visit (HOSPITAL_BASED_OUTPATIENT_CLINIC_OR_DEPARTMENT_OTHER): Payer: Medicare Other | Admitting: Lab

## 2013-04-16 ENCOUNTER — Encounter: Payer: Self-pay | Admitting: Physician Assistant

## 2013-04-16 ENCOUNTER — Other Ambulatory Visit: Payer: Self-pay | Admitting: Physician Assistant

## 2013-04-16 ENCOUNTER — Other Ambulatory Visit: Payer: Self-pay | Admitting: Cardiology

## 2013-04-16 DIAGNOSIS — N390 Urinary tract infection, site not specified: Secondary | ICD-10-CM

## 2013-04-16 DIAGNOSIS — C569 Malignant neoplasm of unspecified ovary: Secondary | ICD-10-CM

## 2013-04-16 DIAGNOSIS — C561 Malignant neoplasm of right ovary: Secondary | ICD-10-CM

## 2013-04-16 DIAGNOSIS — R300 Dysuria: Secondary | ICD-10-CM

## 2013-04-16 LAB — CBC WITH DIFFERENTIAL/PLATELET
Eosinophils Absolute: 0 10*3/uL (ref 0.0–0.5)
HCT: 32.8 % — ABNORMAL LOW (ref 34.8–46.6)
LYMPH%: 32.7 % (ref 14.0–49.7)
MCHC: 35.1 g/dL (ref 31.5–36.0)
MCV: 94 fL (ref 79.5–101.0)
MONO#: 0.6 10*3/uL (ref 0.1–0.9)
MONO%: 11.7 % (ref 0.0–14.0)
NEUT#: 2.9 10*3/uL (ref 1.5–6.5)
NEUT%: 54.2 % (ref 38.4–76.8)
Platelets: 139 10*3/uL — ABNORMAL LOW (ref 145–400)
RBC: 3.49 10*6/uL — ABNORMAL LOW (ref 3.70–5.45)

## 2013-04-16 MED ORDER — CIPROFLOXACIN HCL 250 MG PO TABS: 250.0000 mg | ORAL_TABLET | Freq: Two times a day (BID) | ORAL | Status: DC

## 2013-04-16 NOTE — Progress Notes (Signed)
Telephone the patient and informed her of a positive urine culture for urinary tract infection. She has been treated with Cipro 250 mg by mouth twice daily for 3 days. Patient discussed with Dr. Darrold Span we will treat her for an additional 4 days for a total of 7 days of therapy with Cipro 250 mg by mouth twice daily. Prescription was sent to her pharmacy of record via [facility] Sc[facility]. Patient voiced understanding.  Laural Benes, Lenny Bouchillon E, PA-C

## 2013-04-17 ENCOUNTER — Ambulatory Visit (HOSPITAL_BASED_OUTPATIENT_CLINIC_OR_DEPARTMENT_OTHER): Payer: Medicare Other

## 2013-04-17 ENCOUNTER — Other Ambulatory Visit: Payer: Medicare Other | Admitting: Lab

## 2013-04-17 VITALS — BP 146/80 | HR 71 | Temp 96.8°F | Resp 20

## 2013-04-17 DIAGNOSIS — Z5111 Encounter for antineoplastic chemotherapy: Secondary | ICD-10-CM

## 2013-04-17 DIAGNOSIS — C561 Malignant neoplasm of right ovary: Secondary | ICD-10-CM

## 2013-04-17 DIAGNOSIS — C569 Malignant neoplasm of unspecified ovary: Secondary | ICD-10-CM

## 2013-04-17 MED ORDER — SODIUM CHLORIDE 0.9 % IV SOLN
Freq: Once | INTRAVENOUS | Status: AC
  Administered 2013-04-17: 10:00:00 via INTRAVENOUS

## 2013-04-17 MED ORDER — ONDANSETRON 16 MG/50ML IVPB (CHCC)
16.0000 mg | Freq: Once | INTRAVENOUS | Status: AC
  Administered 2013-04-17: 16 mg via INTRAVENOUS

## 2013-04-17 MED ORDER — FAMOTIDINE IN NACL 20-0.9 MG/50ML-% IV SOLN
20.0000 mg | Freq: Once | INTRAVENOUS | Status: AC
  Administered 2013-04-17: 20 mg via INTRAVENOUS

## 2013-04-17 MED ORDER — CARBOPLATIN CHEMO INJECTION 600 MG/60ML
549.0000 mg | Freq: Once | INTRAVENOUS | Status: AC
  Administered 2013-04-17: 550 mg via INTRAVENOUS
  Filled 2013-04-17: qty 55

## 2013-04-17 MED ORDER — DEXAMETHASONE SODIUM PHOSPHATE 20 MG/5ML IJ SOLN
20.0000 mg | Freq: Once | INTRAMUSCULAR | Status: AC
  Administered 2013-04-17: 20 mg via INTRAVENOUS

## 2013-04-17 MED ORDER — SODIUM CHLORIDE 0.9 % IV SOLN
80.0000 mg/m2 | Freq: Once | INTRAVENOUS | Status: AC
  Administered 2013-04-17: 132 mg via INTRAVENOUS
  Filled 2013-04-17: qty 22

## 2013-04-17 MED ORDER — DIPHENHYDRAMINE HCL 50 MG/ML IJ SOLN
50.0000 mg | Freq: Once | INTRAMUSCULAR | Status: AC
  Administered 2013-04-17: 50 mg via INTRAVENOUS

## 2013-04-17 NOTE — Patient Instructions (Addendum)
[facility] Discharge Instructions for Patients Receiving Chemotherapy  Today you received the following chemotherapy agents Taxol/Carboplatin To help prevent nausea and vomiting after your treatment, we encourage you to take your nausea medication as prescribed.   If you develop nausea and vomiting that is not controlled by your nausea medication, call the clinic. If it is after clinic hours your family physician or the after hours number for the clinic or go to the Emergency Department.   BELOW ARE SYMPTOMS THAT SHOULD BE REPORTED IMMEDIATELY:  *FEVER GREATER THAN 100.5 F  *CHILLS WITH OR WITHOUT FEVER  NAUSEA AND VOMITING THAT IS NOT CONTROLLED WITH YOUR NAUSEA MEDICATION  *UNUSUAL SHORTNESS OF BREATH  *UNUSUAL BRUISING OR BLEEDING  TENDERNESS IN MOUTH AND THROAT WITH OR WITHOUT PRESENCE OF ULCERS  *URINARY PROBLEMS  *BOWEL PROBLEMS  UNUSUAL RASH Items with * indicate a potential emergency and should be followed up as soon as possible.  One of the nurses will contact you 24 hours after your treatment. Please let the nurse know about any problems that you may have experienced. Feel free to call the clinic you have any questions or concerns. The clinic phone number is [phone].   I have been informed and understand all the instructions given to me. I know to contact the clinic, my physician, or go to the Emergency Department if any problems should occur. I do not have any questions at this time, but understand that I may call the clinic during office hours   should I have any questions or need assistance in obtaining follow up care.    __________________________________________  _____________  __________ Signature of Patient or Authorized Representative            Date                   Time    __________________________________________ Nurse's Signature     Paclitaxel injection What is this medicine? PACLITAXEL (PAK li TAX el) is a chemotherapy  drug. It targets fast dividing cells, like cancer cells, and causes these cells to die. This medicine is used to treat ovarian cancer, breast cancer, and other cancers. This medicine may be used for other purposes; ask your health care provider or pharmacist if you have questions. What should I tell my health care provider before I take this medicine? They need to know if you have any of these conditions: -blood disorders -irregular heartbeat -infection (especially a virus infection such as chickenpox, cold sores, or herpes) -liver disease -previous or ongoing radiation therapy -an unusual or allergic reaction to paclitaxel, alcohol, polyoxyethylated castor oil, other chemotherapy agents, other medicines, foods, dyes, or preservatives -pregnant or trying to get pregnant -breast-feeding How should I use this medicine? This drug is given as an infusion into a vein. It is administered in a hospital or clinic by a specially trained health care professional. Talk to your pediatrician regarding the use of this medicine in children. Special care may be needed. Overdosage: If you think you have taken too much of this medicine contact a poison control center or emergency room at once. NOTE: This medicine is only for you. Do not share this medicine with others. What if I miss a dose? It is important not to miss your dose. Call your doctor or health care professional if you are unable to keep an appointment. What may interact with this medicine? Do not take this medicine with any of the following medications: -disulfiram -metronidazole This medicine may  also interact with the following medications: -cyclosporine -dexamethasone -diazepam -ketoconazole -medicines to increase blood counts like filgrastim, pegfilgrastim, sargramostim -other chemotherapy drugs like cisplatin, doxorubicin, epirubicin, etoposide, teniposide, vincristine -quinidine -testosterone -vaccines -verapamil Talk to your  doctor or health care professional before taking any of these medicines: -acetaminophen -aspirin -ibuprofen -ketoprofen -naproxen This list may not describe all possible interactions. Give your health care provider a list of all the medicines, herbs, non-prescription drugs, or dietary supplements you use. Also tell them if you smoke, drink alcohol, or use illegal drugs. Some items may interact with your medicine. What should I watch for while using this medicine? Your condition will be monitored carefully while you are receiving this medicine. You will need important blood work done while you are taking this medicine. This drug may make you feel generally unwell. This is not uncommon, as chemotherapy can affect healthy cells as well as cancer cells. Report any side effects. Continue your course of treatment even though you feel ill unless your doctor tells you to stop. In some cases, you may be given additional medicines to help with side effects. Follow all directions for their use. Call your doctor or health care professional for advice if you get a fever, chills or sore throat, or other symptoms of a cold or flu. Do not treat yourself. This drug decreases your body's ability to fight infections. Try to avoid being around people who are sick. This medicine may increase your risk to bruise or bleed. Call your doctor or health care professional if you notice any unusual bleeding. Be careful brushing and flossing your teeth or using a toothpick because you may get an infection or bleed more easily. If you have any dental work done, tell your dentist you are receiving this medicine. Avoid taking products that contain aspirin, acetaminophen, ibuprofen, naproxen, or ketoprofen unless instructed by your doctor. These medicines may hide a fever. Do not become pregnant while taking this medicine. Women should inform their doctor if they wish to become pregnant or think they might be pregnant. There is a  potential for serious side effects to an unborn child. Talk to your health care professional or pharmacist for more information. Do not breast-feed an infant while taking this medicine. Men are advised not to father a child while receiving this medicine. What side effects may I notice from receiving this medicine? Side effects that you should report to your doctor or health care professional as soon as possible: -allergic reactions like skin rash, itching or hives, swelling of the face, lips, or tongue -low blood counts - This drug may decrease the number of white blood cells, red blood cells and platelets. You may be at increased risk for infections and bleeding. -signs of infection - fever or chills, cough, sore throat, pain or difficulty passing urine -signs of decreased platelets or bleeding - bruising, pinpoint red spots on the skin, black, tarry stools, nosebleeds -signs of decreased red blood cells - unusually weak or tired, fainting spells, lightheadedness -breathing problems -chest pain -high or low blood pressure -mouth sores -nausea and vomiting -pain, swelling, redness or irritation at the injection site -pain, tingling, numbness in the hands or feet -slow or irregular heartbeat -swelling of the ankle, feet, hands Side effects that usually do not require medical attention (report to your doctor or health care professional if they continue or are bothersome): -bone pain -complete hair loss including hair on your head, underarms, pubic hair, eyebrows, and eyelashes -changes in the color of fingernails -  diarrhea -loosening of the fingernails -loss of appetite -muscle or joint pain -red flush to skin -sweating This list may not describe all possible side effects. Call your doctor for medical advice about side effects. You may report side effects to FDA at 1-800-FDA-1088. Where should I keep my medicine? This drug is given in a hospital or clinic and will not be stored at  home. NOTE: This sheet is a summary. It may not cover all possible information. If you have questions about this medicine, talk to your doctor, pharmacist, or health care provider.  2013, [employer]. (10/28/2008 11:54:26 AM)    Carboplatin injection What is this medicine? CARBOPLATIN (KAR boe pla tin) is a chemotherapy drug. It targets fast dividing cells, like cancer cells, and causes these cells to die. This medicine is used to treat ovarian cancer and many other cancers. This medicine may be used for other purposes; ask your health care provider or pharmacist if you have questions. What should I tell my health care provider before I take this medicine? They need to know if you have any of these conditions: -blood disorders -hearing problems -kidney disease -recent or ongoing radiation therapy -an unusual or allergic reaction to carboplatin, cisplatin, other chemotherapy, other medicines, foods, dyes, or preservatives -pregnant or trying to get pregnant -breast-feeding How should I use this medicine? This drug is usually given as an infusion into a vein. It is administered in a hospital or clinic by a specially trained health care professional. Talk to your pediatrician regarding the use of this medicine in children. Special care may be needed. Overdosage: If you think you have taken too much of this medicine contact a poison control center or emergency room at once. NOTE: This medicine is only for you. Do not share this medicine with others. What if I miss a dose? It is important not to miss a dose. Call your doctor or health care professional if you are unable to keep an appointment. What may interact with this medicine? -medicines for seizures -medicines to increase blood counts like filgrastim, pegfilgrastim, sargramostim -some antibiotics like amikacin, gentamicin, neomycin, streptomycin, tobramycin -vaccines Talk to your doctor or health care professional before  taking any of these medicines: -acetaminophen -aspirin -ibuprofen -ketoprofen -naproxen This list may not describe all possible interactions. Give your health care provider a list of all the medicines, herbs, non-prescription drugs, or dietary supplements you use. Also tell them if you smoke, drink alcohol, or use illegal drugs. Some items may interact with your medicine. What should I watch for while using this medicine? Your condition will be monitored carefully while you are receiving this medicine. You will need important blood work done while you are taking this medicine. This drug may make you feel generally unwell. This is not uncommon, as chemotherapy can affect healthy cells as well as cancer cells. Report any side effects. Continue your course of treatment even though you feel ill unless your doctor tells you to stop. In some cases, you may be given additional medicines to help with side effects. Follow all directions for their use. Call your doctor or health care professional for advice if you get a fever, chills or sore throat, or other symptoms of a cold or flu. Do not treat yourself. This drug decreases your body's ability to fight infections. Try to avoid being around people who are sick. This medicine may increase your risk to bruise or bleed. Call your doctor or health care professional if you notice any unusual bleeding.  Be careful brushing and flossing your teeth or using a toothpick because you may get an infection or bleed more easily. If you have any dental work done, tell your dentist you are receiving this medicine. Avoid taking products that contain aspirin, acetaminophen, ibuprofen, naproxen, or ketoprofen unless instructed by your doctor. These medicines may hide a fever. Do not become pregnant while taking this medicine. Women should inform their doctor if they wish to become pregnant or think they might be pregnant. There is a potential for serious side effects to an  unborn child. Talk to your health care professional or pharmacist for more information. Do not breast-feed an infant while taking this medicine. What side effects may I notice from receiving this medicine? Side effects that you should report to your doctor or health care professional as soon as possible: -allergic reactions like skin rash, itching or hives, swelling of the face, lips, or tongue -signs of infection - fever or chills, cough, sore throat, pain or difficulty passing urine -signs of decreased platelets or bleeding - bruising, pinpoint red spots on the skin, black, tarry stools, nosebleeds -signs of decreased red blood cells - unusually weak or tired, fainting spells, lightheadedness -breathing problems -changes in hearing -changes in vision -chest pain -high blood pressure -low blood counts - This drug may decrease the number of white blood cells, red blood cells and platelets. You may be at increased risk for infections and bleeding. -nausea and vomiting -pain, swelling, redness or irritation at the injection site -pain, tingling, numbness in the hands or feet -problems with balance, talking, walking -trouble passing urine or change in the amount of urine Side effects that usually do not require medical attention (report to your doctor or health care professional if they continue or are bothersome): -hair loss -loss of appetite -metallic taste in the mouth or changes in taste This list may not describe all possible side effects. Call your doctor for medical advice about side effects. You may report side effects to FDA at 1-800-FDA-1088. Where should I keep my medicine? This drug is given in a hospital or clinic and will not be stored at home. NOTE: This sheet is a summary. It may not cover all possible information. If you have questions about this medicine, talk to your doctor, pharmacist, or health care provider.  2013, [employer]. (02/20/2008 2:38:05 PM)

## 2013-04-17 NOTE — Telephone Encounter (Signed)
Rx was sent to pharmacy electronically. 

## 2013-04-18 ENCOUNTER — Telehealth: Payer: Self-pay

## 2013-04-18 ENCOUNTER — Ambulatory Visit (HOSPITAL_BASED_OUTPATIENT_CLINIC_OR_DEPARTMENT_OTHER): Payer: Medicare Other

## 2013-04-18 VITALS — BP 129/68 | HR 70 | Temp 98.3°F

## 2013-04-18 DIAGNOSIS — C792 Secondary malignant neoplasm of skin: Secondary | ICD-10-CM

## 2013-04-18 DIAGNOSIS — C561 Malignant neoplasm of right ovary: Secondary | ICD-10-CM

## 2013-04-18 DIAGNOSIS — Z5189 Encounter for other specified aftercare: Secondary | ICD-10-CM

## 2013-04-18 DIAGNOSIS — C569 Malignant neoplasm of unspecified ovary: Secondary | ICD-10-CM

## 2013-04-18 MED ORDER — FILGRASTIM 300 MCG/0.5ML IJ SOLN
300.0000 ug | Freq: Once | INTRAMUSCULAR | Status: AC
  Administered 2013-04-18: 300 ug via SUBCUTANEOUS
  Filled 2013-04-18: qty 0.5

## 2013-04-18 NOTE — Telephone Encounter (Signed)
[name] was concerned that she was having urinary leakage.  She had to buy poise pads today.  She knows that she has the UTI but wondered if the chemotherapy was contributing to the Incontinence. Told [name] that Dr. 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 Will see if the incontinence decreases with atb and some healing time.  Dr. Darrold Span will be glad to refer her to a urologist if the problem persists.   [name] verbalized understanding.

## 2013-04-19 ENCOUNTER — Emergency Department (HOSPITAL_COMMUNITY): Payer: Medicare Other

## 2013-04-19 ENCOUNTER — Observation Stay (HOSPITAL_COMMUNITY)
Admission: EM | Admit: 2013-04-19 | Discharge: 2013-04-20 | Disposition: A | Discharge status: Home / Self Care | Payer: Medicare Other | Attending: Internal Medicine | Admitting: Internal Medicine

## 2013-04-19 ENCOUNTER — Other Ambulatory Visit: Payer: Self-pay

## 2013-04-19 ENCOUNTER — Encounter (HOSPITAL_COMMUNITY): Payer: Self-pay | Admitting: Emergency Medicine

## 2013-04-19 DIAGNOSIS — Z8679 Personal history of other diseases of the circulatory system: Secondary | ICD-10-CM

## 2013-04-19 DIAGNOSIS — T451X5A Adverse effect of antineoplastic and immunosuppressive drugs, initial encounter: Secondary | ICD-10-CM | POA: Insufficient documentation

## 2013-04-19 DIAGNOSIS — E039 Hypothyroidism, unspecified: Secondary | ICD-10-CM | POA: Diagnosis present

## 2013-04-19 DIAGNOSIS — E785 Hyperlipidemia, unspecified: Secondary | ICD-10-CM | POA: Insufficient documentation

## 2013-04-19 DIAGNOSIS — D649 Anemia, unspecified: Secondary | ICD-10-CM | POA: Insufficient documentation

## 2013-04-19 DIAGNOSIS — N390 Urinary tract infection, site not specified: Secondary | ICD-10-CM | POA: Insufficient documentation

## 2013-04-19 DIAGNOSIS — E869 Volume depletion, unspecified: Secondary | ICD-10-CM | POA: Insufficient documentation

## 2013-04-19 DIAGNOSIS — R110 Nausea: Secondary | ICD-10-CM | POA: Insufficient documentation

## 2013-04-19 DIAGNOSIS — C569 Malignant neoplasm of unspecified ovary: Secondary | ICD-10-CM | POA: Diagnosis present

## 2013-04-19 DIAGNOSIS — Z5111 Encounter for antineoplastic chemotherapy: Secondary | ICD-10-CM | POA: Insufficient documentation

## 2013-04-19 DIAGNOSIS — Z79899 Other long term (current) drug therapy: Secondary | ICD-10-CM | POA: Insufficient documentation

## 2013-04-19 DIAGNOSIS — I10 Essential (primary) hypertension: Secondary | ICD-10-CM | POA: Diagnosis present

## 2013-04-19 DIAGNOSIS — R55 Syncope and collapse: Principal | ICD-10-CM | POA: Diagnosis present

## 2013-04-19 LAB — COMPREHENSIVE METABOLIC PANEL
AST: 21 U/L (ref 0–37)
Albumin: 3.4 g/dL — ABNORMAL LOW (ref 3.5–5.2)
Alkaline Phosphatase: 76 U/L (ref 39–117)
BUN: 13 mg/dL (ref 6–23)
CO2: 26 mEq/L (ref 19–32)
Chloride: 99 mEq/L (ref 96–112)
GFR calc non Af Amer: >90 mL/min (ref >90)
Potassium: 3.7 mEq/L (ref 3.5–5.1)
Total Bilirubin: 0.8 mg/dL (ref 0.3–1.2)

## 2013-04-19 LAB — CBC WITH DIFFERENTIAL/PLATELET
Basophils Relative: 0 % (ref 0–1)
Hemoglobin: 10.1 g/dL — ABNORMAL LOW (ref 12.0–15.0)
Lymphocytes Relative: 8 % — ABNORMAL LOW (ref 12–46)
MCHC: 33.2 g/dL (ref 30.0–36.0)
Monocytes Relative: 0 % — ABNORMAL LOW (ref 3–12)
Neutro Abs: 10.9 10*3/uL — ABNORMAL HIGH (ref 1.7–7.7)
Neutrophils Relative %: 92 % — ABNORMAL HIGH (ref 43–77)
RBC: 3.25 MIL/uL — ABNORMAL LOW (ref 3.87–5.11)
WBC: 11.9 10*3/uL — ABNORMAL HIGH (ref 4.0–10.5)

## 2013-04-19 LAB — TROPONIN I: Troponin I: <0.3 ng/mL (ref <0.30)

## 2013-04-19 LAB — URINALYSIS, ROUTINE W REFLEX MICROSCOPIC
Glucose, UA: NEGATIVE mg/dL
Hgb urine dipstick: NEGATIVE
Ketones, ur: NEGATIVE mg/dL
Protein, ur: NEGATIVE mg/dL

## 2013-04-19 MED ORDER — ONDANSETRON HCL 4 MG PO TABS
8.0000 mg | ORAL_TABLET | Freq: Two times a day (BID) | ORAL | Status: DC | PRN
  Administered 2013-04-20: 8 mg via ORAL
  Filled 2013-04-19: qty 2

## 2013-04-19 MED ORDER — ACETAMINOPHEN 650 MG RE SUPP: 650.0000 mg | Freq: Four times a day (QID) | RECTAL | Status: DC | PRN

## 2013-04-19 MED ORDER — ACETAMINOPHEN 325 MG PO TABS: 650.0000 mg | ORAL_TABLET | Freq: Four times a day (QID) | ORAL | Status: DC | PRN

## 2013-04-19 MED ORDER — ONDANSETRON HCL 4 MG/2ML IJ SOLN
4.0000 mg | Freq: Once | INTRAMUSCULAR | Status: AC
  Administered 2013-04-19: 4 mg via INTRAVENOUS
  Filled 2013-04-19: qty 2

## 2013-04-19 MED ORDER — LEVOTHYROXINE SODIUM 88 MCG PO TABS
88.0000 ug | ORAL_TABLET | Freq: Every day | ORAL | Status: DC
  Filled 2013-04-19: qty 1

## 2013-04-19 MED ORDER — SULFAMETHOXAZOLE-TMP DS 800-160 MG PO TABS
1.0000 | ORAL_TABLET | Freq: Two times a day (BID) | ORAL | Status: DC
  Administered 2013-04-19 – 2013-04-20 (×2): 1 via ORAL
  Filled 2013-04-19 (×3): qty 1

## 2013-04-19 MED ORDER — LEVOTHYROXINE SODIUM 88 MCG PO TABS
88.0000 ug | ORAL_TABLET | ORAL | Status: DC
  Administered 2013-04-20: 88 ug via ORAL

## 2013-04-19 MED ORDER — SODIUM CHLORIDE 0.9 % IV BOLUS (SEPSIS)
1000.0000 mL | Freq: Once | INTRAVENOUS | Status: AC
  Administered 2013-04-19: 1000 mL via INTRAVENOUS

## 2013-04-19 MED ORDER — IOHEXOL 350 MG/ML SOLN
100.0000 mL | Freq: Once | INTRAVENOUS | Status: AC | PRN
  Administered 2013-04-19: 100 mL via INTRAVENOUS

## 2013-04-19 MED ORDER — ADULT MULTIVITAMIN W/MINERALS CH
1.0000 | ORAL_TABLET | Freq: Every day | ORAL | Status: DC
  Administered 2013-04-20: 1 via ORAL
  Filled 2013-04-19: qty 1

## 2013-04-19 MED ORDER — CLOPIDOGREL BISULFATE 75 MG PO TABS
75.0000 mg | ORAL_TABLET | Freq: Every day | ORAL | Status: DC
  Administered 2013-04-19: 75 mg via ORAL
  Filled 2013-04-19 (×2): qty 1

## 2013-04-19 MED ORDER — PROMETHAZINE HCL 25 MG/ML IJ SOLN
25.0000 mg | Freq: Three times a day (TID) | INTRAMUSCULAR | Status: DC | PRN
  Filled 2013-04-19: qty 1

## 2013-04-19 MED ORDER — SODIUM CHLORIDE 0.9 % IV SOLN
INTRAVENOUS | Status: DC
  Administered 2013-04-19 – 2013-04-20 (×2): via INTRAVENOUS

## 2013-04-19 MED ORDER — SODIUM CHLORIDE 0.9 % IJ SOLN
3.0000 mL | Freq: Two times a day (BID) | INTRAMUSCULAR | Status: DC
  Administered 2013-04-19: 3 mL via INTRAVENOUS

## 2013-04-19 MED ORDER — BISOPROLOL FUMARATE 5 MG PO TABS
5.0000 mg | ORAL_TABLET | Freq: Two times a day (BID) | ORAL | Status: DC
  Administered 2013-04-19 – 2013-04-20 (×2): 5 mg via ORAL
  Filled 2013-04-19 (×3): qty 1

## 2013-04-19 MED ORDER — PROPAFENONE HCL 225 MG PO TABS
225.0000 mg | ORAL_TABLET | Freq: Two times a day (BID) | ORAL | Status: DC
  Administered 2013-04-19 – 2013-04-20 (×2): 225 mg via ORAL
  Filled 2013-04-19 (×3): qty 1

## 2013-04-19 MED ORDER — MORPHINE SULFATE 2 MG/ML IJ SOLN: 2.0000 mg | INTRAMUSCULAR | Status: DC | PRN

## 2013-04-19 MED ORDER — ENOXAPARIN SODIUM 40 MG/0.4ML ~~LOC~~ SOLN
40.0000 mg | SUBCUTANEOUS | Status: DC
  Administered 2013-04-20: 40 mg via SUBCUTANEOUS
  Filled 2013-04-19: qty 0.4

## 2013-04-19 MED ORDER — ESTRADIOL 1 MG PO TABS
1.0000 mg | ORAL_TABLET | Freq: Every day | ORAL | Status: DC
  Administered 2013-04-20: 1 mg via ORAL
  Filled 2013-04-19: qty 1

## 2013-04-19 MED ORDER — HYDRALAZINE HCL 25 MG PO TABS
25.0000 mg | ORAL_TABLET | Freq: Two times a day (BID) | ORAL | Status: DC
Start: 2013-04-19 — End: 2013-04-20
  Administered 2013-04-19: 25 mg via ORAL
  Filled 2013-04-19 (×3): qty 1

## 2013-04-19 MED ORDER — POTASSIUM CHLORIDE CRYS ER 10 MEQ PO TBCR
10.0000 meq | EXTENDED_RELEASE_TABLET | Freq: Two times a day (BID) | ORAL | Status: DC
  Administered 2013-04-19 – 2013-04-20 (×2): 10 meq via ORAL
  Filled 2013-04-19 (×3): qty 1

## 2013-04-19 MED ORDER — POLYETHYLENE GLYCOL 3350 17 G PO PACK
17.0000 g | PACK | Freq: Every day | ORAL | Status: DC
  Filled 2013-04-19: qty 1

## 2013-04-19 MED ORDER — ASPIRIN EC 81 MG PO TBEC
81.0000 mg | DELAYED_RELEASE_TABLET | Freq: Every day | ORAL | Status: DC
  Administered 2013-04-20: 81 mg via ORAL
  Filled 2013-04-19: qty 1

## 2013-04-19 NOTE — ED Notes (Signed)
Per EMS-pt had syncopal episode while at [employer], became unresponsive while sitting in wheelchair, assist with BVM. Initial pressure 40/20. bag NS. Pt AAO at this time.

## 2013-04-19 NOTE — ED Notes (Signed)
I-stat troponin changed to main lab troponin test.

## 2013-04-19 NOTE — ED Notes (Signed)
YQM:[id number]<ON> Expected date:04/19/13<BR> Expected time: 3:38 PM<BR> Means of arrival:<BR> Comments:<BR> 62&#39;s female cancer pt

## 2013-04-19 NOTE — ED Provider Notes (Signed)
History     CSN: [id number]  Arrival date & time 04/19/13  1540   First MD Initiated Contact with Patient 04/19/13 1552      Chief Complaint  Patient presents with  . Loss of Consciousness  . Cancer    (Consider location/radiation/quality/duration/timing/severity/associated sxs/prior treatment) HPI Comments: 72 year old female with a past medical history of ovarian cancer, hypertension, hypothyroid, dysrhythmia and dyslipidemia presents to the emergency department via EMS after having a syncopal episode prior to arrival while at [employer]. Per EMS, patient was standing at the checkout at [employer] when a cashier said she became "blank", the manager fracture of her chair and patient sat down and had a syncopal episode. EMS was called, and when they arrived she was unresponsive. They laid her on the floor and her initial blood pressure was 40/20, breathing was assisted with BVM for a few minutes, patient was placed in the ambulance in the Trendelenburg position and her blood pressure immediately came up to 130s/70s, no longer needed breathing assistance. No incontinence noted. Patient was able to tell EMS personnel how she felt prior to syncopal episode which was faint. Patient is currently undergoing chemotherapy for ovarian cancer, oncologist Dr. Darrold Span, last chemotherapy treatment was 2 days ago on Tuesday. Yesterday she was feeling completely fine, however this morning she was feeling "queasy", took the Zofran and went to the store. Currently she is still feeling nauseated and a little weak. Takes her temperature twice daily and denies fever. Denies vomiting, abdominal pain, urinary or bowel changes, chest pain, shortness of breath, lightheadedness, dizziness or confusion.  Patient is a 72 y.o. female presenting with syncope. The history is provided by the patient and the EMS personnel.  Loss of Consciousness Associated symptoms: nausea and weakness   Associated symptoms: no chest pain, no  confusion, no dizziness, no fever, no shortness of breath and no vomiting     Past Medical History  Diagnosis Date  . Hypertension   . Dyslipidemia   . Hypothyroidism   . Arthritis     OSTEOARTHRITIS   -- CONSTANT PAIN RIGHT HIP---AND PAIN LEFT KNEE--PT STATES SHE GETS INJECTIONS INTO HER KNEE  . Dysrhythmia     HX OF PAROXYSMAL ATRIAL FIB  . Complication of anesthesia     BLOOD PRESSURE DROPPED WITH NASAL SURGERY, ONE OF THE CARPAL TUNNEL REPAIRS AND DURING A COLONOSCOPY  . Cancer     Past Surgical History  Procedure Laterality Date  . Bilateral carpal tunnel repair  2007  . Dilation and curettage of uterus  1969  . Surgery for ruptured ovarian cyst  1969  . Rhinoplasty for fractured nose  1986  . Left knee arthroscopy   2011  . Total hip arthroplasty  02/25/2012    Procedure: TOTAL HIP ARTHROPLASTY ANTERIOR APPROACH;  Surgeon: Kathryne Hitch, MD;  Location: [facility];  Service: Orthopedics;  Laterality: Right;  . Tonsillectomy  1962  . Laparotomy Bilateral 02/13/2013    Procedure: EXPLORATORY LAPAROTOMY TOTAL ABDOMINAL HYSTERECTOMY BILATERAL SALPINGO-OOPHORECTOMY, Partial Rectal Resection with Reanastamosis;  Surgeon: Jeannette Corpus, MD;  Location: [facility];  Service: Gynecology;  Laterality: Bilateral;  . Omentectomy  02/13/2013    Procedure: OMENTECTOMY;  Surgeon: Jeannette Corpus, MD;  Location: [facility];  Service: Gynecology;;  . Lymphadenectomy Right 02/13/2013    Procedure: PEVLIC  LYMPHADENECTOMY, DEBULKING right pelvic tumor nodules;  Surgeon: Jeannette Corpus, MD;  Location: [facility];  Service: Gynecology;  Laterality: Right;    Family History  Problem  Relation Age of Onset  . Hypertension Mother     History  Substance Use Topics  . Smoking status: Never Smoker   . Smokeless tobacco: Never Used  . Alcohol Use: No    OB History   Grav Para Term Preterm Abortions TAB SAB Ect Mult Living   3 2   1  1  1 3       Review of Systems   Constitutional: Negative for fever and chills.  Respiratory: Negative for shortness of breath.   Cardiovascular: Positive for syncope. Negative for chest pain.  Gastrointestinal: Positive for nausea. Negative for vomiting and abdominal pain.  Neurological: Positive for syncope and weakness. Negative for dizziness and light-headedness.  Psychiatric/Behavioral: Negative for confusion.  All other systems reviewed and are negative.    Allergies  Codeine and Lisinopril  Home Medications   Current Outpatient Rx  Name  Route  Sig  Dispense  Refill  . clopidogrel (PLAVIX) 75 MG tablet   Oral   Take 75 mg by mouth every evening.          Marland Kitchen dexamethasone (DECADRON) 4 MG tablet      5 tablets = 20 mg with food. Take 12 hours prior to taxol or as directed.         Marland Kitchen estradiol (ESTRACE) 1 MG tablet   Oral   Take 1 tablet (1 mg total) by mouth every morning. PT STATES SHE WANTS ESTRACE--NOT THE GENERIC   90 tablet   3   . GLUCOSAMINE HCL PO   Oral   Take 1,000 mg by mouth daily.         . hydrALAZINE (APRESOLINE) 25 MG tablet   Oral   Take 25 mg by mouth 2 (two) times daily.         Marland Kitchen KLOR-CON 10 10 MEQ tablet      2 (two) times daily.          Marland Kitchen levothyroxine (SYNTHROID, LEVOTHROID) 88 MCG tablet   Oral   Take 88 mcg by mouth every morning. PT STATES SHE NEEDS THE SYNTHROID--DOES NOT WANT THE GENERIC         . ondansetron (ZOFRAN) 8 MG tablet      1-2 tablets every 12 hours as needed for nausea. Will not make sleepy.   30 tablet   1   . polyethylene glycol (MIRALAX / GLYCOLAX) packet   Oral   Take 17 g by mouth daily.         . propafenone (RYTHMOL) 225 MG tablet   Oral   Take 225 mg by mouth 2 (two) times daily. 9am9pm         . Fiber POWD   Oral   Take by mouth daily.         . Multiple Vitamin (MULITIVITAMIN WITH MINERALS) TABS   Oral   Take 1 tablet by mouth every morning.           BP 137/65  Temp(Src) 97.8 F (36.6 C) (Oral)   Resp 12  SpO2 99%  Physical Exam  Nursing note and vitals reviewed. Constitutional: She is oriented to person, place, and time. She appears well-developed and well-nourished. No distress.  Appears fatigued.  HENT:  Head: Normocephalic and atraumatic.  Mouth/Throat: Oropharynx is clear and moist.  Eyes: Conjunctivae and EOM are normal. Pupils are equal, round, and reactive to light.  Neck: Normal range of motion. Neck supple.  Cardiovascular: Normal rate, regular rhythm, normal heart sounds and intact  distal pulses.   No extremity edema.  Pulmonary/Chest: Effort normal and breath sounds normal. No respiratory distress. She has no decreased breath sounds. She has no wheezes. She has no rhonchi. She has no rales.  Abdominal: Soft. Bowel sounds are normal. She exhibits distension. She exhibits no mass. There is no tenderness. There is no rigidity, no rebound and no guarding.  Tenderness of abdomen causes nausea.  Musculoskeletal: Normal range of motion. She exhibits no edema.  Lymphadenopathy:    She has no cervical adenopathy.  Neurological: She is alert and oriented to person, place, and time. She has normal strength. She displays no atrophy. No cranial nerve deficit or sensory deficit. Coordination normal.  Skin: Skin is warm and dry. She is not diaphoretic.  Psychiatric: She has a normal mood and affect. Her speech is normal and behavior is normal.    ED Course  Procedures (including critical care time)  Labs Reviewed  CBC WITH DIFFERENTIAL - Abnormal; Notable for the following:    WBC 11.9 (*)    RBC 3.25 (*)    Hemoglobin 10.1 (*)    HCT 30.4 (*)    Platelets 138 (*)    Neutrophils Relative % 92 (*)    Neutro Abs 10.9 (*)    Lymphocytes Relative 8 (*)    Monocytes Relative 0 (*)    Monocytes Absolute 0.0 (*)    All other components within normal limits  COMPREHENSIVE METABOLIC PANEL  URINALYSIS, ROUTINE W REFLEX MICROSCOPIC  TROPONIN I    Date: 04/19/2013  Rate: 66   Rhythm: normal sinus rhythm  QRS Axis: normal  Intervals: PR prolonged  ST/T Wave abnormalities: nonspecific T wave changes anterior leads  Conduction Disutrbances:borderline AV conduction delay  Narrative Interpretation: no stemi  Old EKG Reviewed: unchanged   Dg Chest 2 View  04/19/2013   *RADIOLOGY REPORT*  Clinical Data: Loss of consciousness.  Generalized weakness. Current history of ovarian cancer.  CHEST - 2 VIEW  Comparison: Two-view chest x-ray 02/21/2012.  Findings: Cardiac silhouette upper normal in size, unchanged, allowing for differences in technique.  Hilar and mediastinal contours otherwise unremarkable.  Lungs clear.  Bronchovascular markings normal.  Pulmonary vascularity normal.  No pneumothorax. No pleural effusions.  IMPRESSION: No acute cardiopulmonary disease.   Original Report Authenticated By: Hulan Saas, M.D.   Ct Angio Chest Pe W/cm &/or Wo Cm  04/19/2013   *RADIOLOGY REPORT*  Clinical Data: Syncope.  Hypotension.  Ovarian carcinoma undergoing chemotherapy.  CT ANGIOGRAPHY CHEST  Technique:  Multidetector CT imaging of the chest using the standard protocol during bolus administration of intravenous contrast. Multiplanar reconstructed images including MIPs were obtained and reviewed to evaluate the vascular anatomy.  Contrast: OMNIPAQUE IOHEXOL 350 MG/ML SOLN  Comparison: 05/15/2009  Findings: Satisfactory opacification of the pulmonary arteries noted, and there is no evidence of pulmonary emboli.  No evidence of thoracic aortic aneurysm or dissection.  No evidence of mediastinal hematoma or mass.  No lymphadenopathy seen within the thorax.  No evidence of pleural or pericardial effusion.  No evidence of pulmonary infiltrate or central endobronchial lesion.  No suspicious pulmonary nodules masses are identified.  No evidence of chest wall mass or suspicious bone lesions.  IMPRESSION: Negative.  No evidence of pulmonary embolism or other active disease.   Original  Report Authenticated By: Myles Rosenthal, M.D.     1. Syncope       MDM  72 y/o female presenting after syncopal episode. Cardio/pulmonary etiology more likely than neurologic. Neuro  exam unremarkable. Obtaining labs including cbc, cmp, troponin, ua, cxr, ct angio to r/o PE. EKG without any changes from prior EKG 05/15/2009. 4:45 PM CXR unremarkable. No neutropenia. 5:14 PM Labs, ua unconcerning. CT angio chest negative for PE or any other abnormality. Will consult hospitalist for admission for syncopal episode and cardiac monitoring. Case discussed with Dr. Hyacinth Meeker who also evaluated patient and agrees with plan of care. 5:39 PM Admission accepted by Dr. Timothy Lasso.  Trevor Mace, PA-C 04/19/13 1739

## 2013-04-19 NOTE — ED Notes (Signed)
No answer when phone call attempted to 4W
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
## 2013-04-19 NOTE — ED Provider Notes (Addendum)
Pt is a 72 y/o female with hx of OV CA with mets - currently undergoing chemo - last given this week - Neulastin shot adn didn't feel well yesterday - very generically ill without specifics.  Husband saw her this AM and stated looked at baseline.  She went to the grocery store by herself, there was a witnessed syncopal episode at the checkout line where she collapsed into a chair and went unconscious requiring assisted ventilation with bag-valve-mask as well as becoming severely hypotensive. She had a period of loss of consciousness awaking in the ambulance. She gradually returned to her baseline. There is no urinary incontinence, no significant headache, no chest pain, no shortness of breath, no swelling of the legs and she has not had fevers or chills. At this time the patient feels like she is getting back to baseline. On my exam she has clear heart and lung sounds, soft abdomen, normal mental status, cranial nerves III through XII intact, follows commands without difficulty.  Her EKG does not show any signs of significant arrhythmia or ischemia, chest x-ray has been ordered, labs and an aura, possibly needs a CT scan to rule out pulmonary abortion given the magnitude of this event with associated hypotension and respiratory difficulty.  D/w Dr. Timothy Lasso who will admit - CT neg for PE  Medical screening examination/treatment/procedure(s) were conducted as a shared visit with non-physician practitioner(s) and myself.  I personally evaluated the patient during the encounter    Vida Roller, MD 04/19/13 1638  Vida Roller, MD 04/19/13 [phone]

## 2013-04-20 DIAGNOSIS — I059 Rheumatic mitral valve disease, unspecified: Secondary | ICD-10-CM

## 2013-04-20 LAB — TROPONIN I: Troponin I: <0.3 ng/mL (ref <0.30)

## 2013-04-20 LAB — CBC
MCV: 93.5 fL (ref 78.0–100.0)
Platelets: 164 10*3/uL (ref 150–400)
RBC: 3.22 MIL/uL — ABNORMAL LOW (ref 3.87–5.11)
WBC: 8.4 10*3/uL (ref 4.0–10.5)

## 2013-04-20 LAB — BASIC METABOLIC PANEL
CO2: 28 mEq/L (ref 19–32)
Chloride: 101 mEq/L (ref 96–112)
Creatinine, Ser: 0.53 mg/dL (ref 0.50–1.10)

## 2013-04-20 MED ORDER — SULFAMETHOXAZOLE-TMP DS 800-160 MG PO TABS: 1.0000 | ORAL_TABLET | Freq: Two times a day (BID) | ORAL | Status: DC

## 2013-04-20 MED ORDER — HYDRALAZINE HCL 25 MG PO TABS: ORAL_TABLET | ORAL | Status: DC

## 2013-04-20 MED ORDER — HYDRALAZINE HCL 25 MG PO TABS
25.0000 mg | ORAL_TABLET | Freq: Two times a day (BID) | ORAL | Status: DC
  Administered 2013-04-20: 25 mg via ORAL
  Filled 2013-04-20 (×2): qty 1

## 2013-04-20 NOTE — ED Provider Notes (Signed)
Medical screening examination/treatment/procedure(s) were conducted as a shared visit with non-physician practitioner(s) and myself.  I personally evaluated the patient during the encounter  Please see my separate respective documentation pertaining to this patient encounter   Vida Roller, MD 04/20/13 1524

## 2013-04-20 NOTE — Progress Notes (Signed)
Subjective: Admitted last night with Syncope/Hypotension - Sounding Vasovagal and correlates to recent Chemo/N on Zofran/Neulasta.  She is also on Cipro and due to fear of prolonging QTc it was changed to bactrim last night. We recently upped her BP meds and ?how much that may have contributed. She is better this am and discussed strategies to get more food and water into her on a daily basis. No Dizzy. Just weak and tired. She does not want this to delay Wednesdays chemo.   Objective: Vital signs in last 24 hours: Temp:  [97.8 F (36.6 C)-98.8 F (37.1 C)] 97.9 F (36.6 C) (05/23 0500) Pulse Rate:  [65-72] 65 (05/23 0500) Resp:  [12-19] 16 (05/23 0500) BP: (114-155)/(62-77) 114/73 mmHg (05/23 0500) SpO2:  [98 %-100 %] 98 % (05/23 0500) Weight:  [59.875 kg (132 lb)] 59.875 kg (132 lb) (05/22 2115) Weight change:  Last BM Date: 04/19/13  CBG (last 3)  No results found for this basename: GLUCAP,  in the last 72 hours  Intake/Output from previous day:  Intake/Output Summary (Last 24 hours) at 04/20/13 0705 Last data filed at 04/20/13 1610  Gross per 24 hour  Intake      0 ml  Output   3950 ml  Net  -3950 ml   05/22 0701 - 05/23 0700 In: -  Out: 3950 [Urine:3950]   Physical Exam General appearance: A and O Eyes: no scleral icterus Throat: oropharynx moist without erythema Resp: CTA Cardio: Reg Chect PAC - CDI GI: Bloated but soft, non-tender; bowel sounds normal; no masses,  no organomegaly Extremities: no clubbing, cyanosis or edema   Lab Results:  Recent Labs  04/19/13 1615 04/20/13 0255  NA 133* 135  K 3.7 3.8  CL 99 101  CO2 26 28  GLUCOSE 97 92  BUN 13 8  CREATININE 0.58 0.53  CALCIUM 8.5 8.7     Recent Labs  04/19/13 1615  AST 21  ALT 21  ALKPHOS 76  BILITOT 0.8  PROT 6.1  ALBUMIN 3.4*     Recent Labs  04/19/13 1615 04/20/13 0255  WBC 11.9* 8.4  NEUTROABS 10.9*  --   HGB 10.1* 10.3*  HCT 30.4* 30.1*  MCV 93.5 93.5  PLT 138* 164     Lab Results  Component Value Date   INR 1.02 02/21/2012   INR 1.4 05/16/2009   INR 1.3 05/15/2009     Recent Labs  04/19/13 1615 04/19/13 2145 04/20/13 0255  TROPONINI <0.30 <0.30 <0.30    No results found for this basename: TSH, T4TOTAL, FREET3, T3FREE, THYROIDAB,  in the last 72 hours  No results found for this basename: VITAMINB12, FOLATE, FERRITIN, TIBC, IRON, RETICCTPCT,  in the last 72 hours  Micro Results: Recent Results (from the past 240 hour(s))  URINE CULTURE     Status: None   Collection Time    04/13/13  1:10 PM      Result Value Range Status   Urine Culture, Routine Culture, Urine   Final   Comment: Final - ===== COLONY COUNT: =====     >=100,000 COLONIES/ML     ENTEROBACTER AEROGENES          ------------------------------------------------------------------------      ENTEROBACTER AEROGENES             PIPERACILLIN/TAZO                MIC      Sensitive        <=4 ug/ml  IMIPENEM                         MIC      Sensitive          2 ug/ml        CEFAZOLIN                        MIC      Resistant       >=64 ug/ml        CEFOXITIN                        MIC      Resistant       >=64 ug/ml        CEFTRIAXONE                      MIC      Sensitive        <=1 ug/ml        CEFTAZIDIME                      MIC      Sensitive        <=1 ug/ml        CEFEPIME                         MIC      Sensitive        <=1 ug/ml        GENTAMICIN                       MIC      Sensitive        <=1 ug/ml        TOBRAMYCIN                       MIC      Sensitive        <=1 ug/ml        CIPROFLOXACIN                    MIC      Sensitive     <=0.25 ug/ml        LEVOFLOXACIN                     MIC      Sensitive     <=0.12 ug/ml        NITROFURANTOIN                   MIC      Resistant        128 ug/ml        TRIMETH/SULFA                    MIC      Sensitive       <=20 ug/ml     END OF REPORT     Studies/Results: Dg Chest 2 View  04/19/2013   *RADIOLOGY  REPORT*  Clinical Data: Loss of consciousness.  Generalized weakness. Current history of ovarian cancer.  CHEST - 2 VIEW  Comparison: Two-view chest x-ray 02/21/2012.  Findings: Cardiac silhouette upper normal in size, unchanged, allowing for differences in technique.  Hilar and mediastinal contours otherwise unremarkable.  Lungs clear.  Bronchovascular markings normal.  Pulmonary vascularity normal.  No pneumothorax. No pleural effusions.  IMPRESSION: No acute cardiopulmonary disease.   Original Report Authenticated By: Hulan Saas, M.D.   Ct Angio Chest Pe W/cm &/or Wo Cm  04/19/2013   *RADIOLOGY REPORT*  Clinical Data: Syncope.  Hypotension.  Ovarian carcinoma undergoing chemotherapy.  CT ANGIOGRAPHY CHEST  Technique:  Multidetector CT imaging of the chest using the standard protocol during bolus administration of intravenous contrast. Multiplanar reconstructed images including MIPs were obtained and reviewed to evaluate the vascular anatomy.  Contrast: OMNIPAQUE IOHEXOL 350 MG/ML SOLN  Comparison: 05/15/2009  Findings: Satisfactory opacification of the pulmonary arteries noted, and there is no evidence of pulmonary emboli.  No evidence of thoracic aortic aneurysm or dissection.  No evidence of mediastinal hematoma or mass.  No lymphadenopathy seen within the thorax.  No evidence of pleural or pericardial effusion.  No evidence of pulmonary infiltrate or central endobronchial lesion.  No suspicious pulmonary nodules masses are identified.  No evidence of chest wall mass or suspicious bone lesions.  IMPRESSION: Negative.  No evidence of pulmonary embolism or other active disease.   Original Report Authenticated By: Myles Rosenthal, M.D.     Medications: Scheduled: . aspirin EC  81 mg Oral Daily  . bisoprolol  5 mg Oral BID  . clopidogrel  75 mg Oral QHS  . enoxaparin (LOVENOX) injection  40 mg Subcutaneous Q24H  . estradiol  1 mg Oral Daily  . hydrALAZINE  25 mg Oral BID  . levothyroxine  88 mcg  Oral Q24H  . multivitamin with minerals  1 tablet Oral Daily  . polyethylene glycol  17 g Oral Daily  . potassium chloride  10 mEq Oral BID  . propafenone  225 mg Oral BID  . sodium chloride  3 mL Intravenous Q12H  . sulfamethoxazole-trimethoprim  1 tablet Oral Q12H   Continuous: . sodium chloride 100 mL/hr at 04/20/13 4540     Assessment/Plan: Principal Problem:   Syncope Active Problems:   Malignant neoplasm of ovary   History of atrial fibrillation   Hypertension   Hypothyroidism  1. Syncope-  vasovagal syncope in the setting of volume depletion, nausea related to her chemotherapy, and relative anorexia with fluid/food. Cardiac dysrhythmia is less likely but with her history of atrial fibrillation and the potential for interaction between Cipro and Propafenone to prolong QTc, she was observed overnight on telemetry. No Arrhythmias. She ruled out for MI with 3 (-) cardiac enzymes. She was hydrated with IV fluids and provided Zofran for nausea. Will just get ECHO to R/Out chemo induced CM.  We discussed strategies at home to increase fluid consumption - she maybe needs 1/2L - 1 L c each chemo dosing?. 2. Malignant neoplasm of ovary- We'll notify Dr. Darrold Span of her admission . Continue chemotherapy per their protocol.  3. History of atrial fibrillation- this was an isolated event in 2010 without recurrence on current regimen.  4. Hypertension- her antihypertensives have been scaled back. Monitor for hypotension. Obtain orthostatics. We discussed doing the Hydralazine BID prn for SBP >155 at home. 5. Hypothyroidism- continue Synthroid- most recent TSH on 5/12 was normal.  6. Disposition- anticipate discharge today if stable.  7. UTI - finish Bactrim based on Cx data    ID -  Anti-infectives   Start     Dose/Rate Route Frequency Ordered Stop   04/19/13 2200  sulfamethoxazole-trimethoprim (BACTRIM DS) 800-160 MG per tablet 1 tablet     1  tablet Oral Every 12 hours 04/19/13 2112        DVT Prophylaxis    LOS: 1 day   Dovey Fatzinger M 04/20/2013, 7:05 AM

## 2013-04-20 NOTE — Discharge Summary (Signed)
Physician Discharge Summary  DISCHARGE SUMMARY   Patient ID: [name] MR#: [id number] DOB/AGE: July 10, 1941 72 y.o.   Attending Physician:Cheron Coryell M  Patient's ION:[name],[name], MD  Consults:  none  Admit date: 04/19/2013 Discharge date: 04/20/2013  Discharge Diagnoses:  Principal Problem:   Syncope Active Problems:   Malignant neoplasm of ovary   History of atrial fibrillation   Hypertension   Hypothyroidism   Patient Active Problem List   Diagnosis Date Noted  . Syncope 04/19/2013  . History of atrial fibrillation 04/19/2013  . Hypertension 04/19/2013  . Hypothyroidism 04/19/2013  . Malignant neoplasm of ovary 03/11/2013  . Elevated cancer antigen 125 (CA 125) 01/05/2013  . Degenerative arthritis of hip 02/25/2012   Past Medical History  Diagnosis Date  . Hypertension   . Dyslipidemia   . Hypothyroidism   . Arthritis     OSTEOARTHRITIS   -- CONSTANT PAIN RIGHT HIP---AND PAIN LEFT KNEE--PT STATES SHE GETS INJECTIONS INTO HER KNEE  . Dysrhythmia     HX OF PAROXYSMAL ATRIAL FIB  . Complication of anesthesia     BLOOD PRESSURE DROPPED WITH NASAL SURGERY, ONE OF THE CARPAL TUNNEL REPAIRS AND DURING A COLONOSCOPY  . Cancer     Discharged Condition: stable  Discharge Medications:   Medication List    STOP taking these medications       ciprofloxacin 250 MG tablet  Commonly known as:  CIPRO      TAKE these medications       aspirin EC 81 MG tablet  Take 81 mg by mouth daily.     bisoprolol 5 MG tablet  Commonly known as:  ZEBETA  Take 5 mg by mouth 2 (two) times daily.     clopidogrel 75 MG tablet  Commonly known as:  PLAVIX  Take 75 mg by mouth every evening.     dexamethasone 4 MG tablet  Commonly known as:  DECADRON  Take 20 mg by mouth once a week. Take 12 hours prior to Taxol or as directed.     estradiol 1 MG tablet  Commonly known as:  ESTRACE  Take 1 tablet (1 mg total) by mouth every morning. PT STATES SHE WANTS ESTRACE--NOT  THE GENERIC     GLUCOSAMINE HCL PO  Take 1,000 mg by mouth daily.     hydrALAZINE 25 MG tablet  Commonly known as:  APRESOLINE  2 times daily only if SBP > 155     levothyroxine 88 MCG tablet  Commonly known as:  SYNTHROID, LEVOTHROID  Take 88 mcg by mouth every morning. PT STATES SHE NEEDS THE SYNTHROID--DOES NOT WANT THE GENERIC     multivitamin with minerals Tabs  Take 1 tablet by mouth every morning.     ondansetron 8 MG tablet  Commonly known as:  ZOFRAN  Take 8-16 mg by mouth every 12 (twelve) hours as needed for nausea.     polyethylene glycol packet  Commonly known as:  MIRALAX / GLYCOLAX  Take 17 g by mouth daily.     potassium chloride 10 MEQ tablet  Commonly known as:  K-DUR,KLOR-CON  Take 10 mEq by mouth 2 (two) times daily.     PRESCRIPTION MEDICATION  Inject into the vein every 7 (seven) days. Taxol infusion q7d and Carboplatin infusion q21d.     propafenone 225 MG tablet  Commonly known as:  RYTHMOL  Take 225 mg by mouth 2 (two) times daily. 9am9pm     sulfamethoxazole-trimethoprim 800-160 MG per tablet  Commonly  known as:  BACTRIM DS  Take 1 tablet by mouth every 12 (twelve) hours.        Hospital Procedures: Dg Chest 2 View  04/19/2013   *RADIOLOGY REPORT*  Clinical Data: Loss of consciousness.  Generalized weakness. Current history of ovarian cancer.  CHEST - 2 VIEW  Comparison: Two-view chest x-ray 02/21/2012.  Findings: Cardiac silhouette upper normal in size, unchanged, allowing for differences in technique.  Hilar and mediastinal contours otherwise unremarkable.  Lungs clear.  Bronchovascular markings normal.  Pulmonary vascularity normal.  No pneumothorax. No pleural effusions.  IMPRESSION: No acute cardiopulmonary disease.   Original Report Authenticated By: Hulan Saas, M.D.   Ct Angio Chest Pe W/cm &/or Wo Cm  04/19/2013   *RADIOLOGY REPORT*  Clinical Data: Syncope.  Hypotension.  Ovarian carcinoma undergoing chemotherapy.  CT ANGIOGRAPHY  CHEST  Technique:  Multidetector CT imaging of the chest using the standard protocol during bolus administration of intravenous contrast. Multiplanar reconstructed images including MIPs were obtained and reviewed to evaluate the vascular anatomy.  Contrast: OMNIPAQUE IOHEXOL 350 MG/ML SOLN  Comparison: 05/15/2009  Findings: Satisfactory opacification of the pulmonary arteries noted, and there is no evidence of pulmonary emboli.  No evidence of thoracic aortic aneurysm or dissection.  No evidence of mediastinal hematoma or mass.  No lymphadenopathy seen within the thorax.  No evidence of pleural or pericardial effusion.  No evidence of pulmonary infiltrate or central endobronchial lesion.  No suspicious pulmonary nodules masses are identified.  No evidence of chest wall mass or suspicious bone lesions.  IMPRESSION: Negative.  No evidence of pulmonary embolism or other active disease.   Original Report Authenticated By: Myles Rosenthal, M.D.    History of Present Illness: [name] is a 72 year old white female with a history of ovarian cancer on active chemotherapy who presented to the emergency department after a syncopal episode while at the grocery store. She's been receiving chemotherapy per Dr. Darrold Span- she has just begun her second cycle and received Taxol and carboplatin on 5/20. She received her Neupogen injection 1 day prior. Yesterday she awoke feeling queasy with some increase in fatigue. She ate a boiled egg and went to the grocery store. She did take some Zofran for her nausea. She returned home and had some fruit cocktail and then went to another grocery store that afternoon. After completing her shopping while standing at the counter she began to feel lightheaded as if she may pass out. She then had a witnessed syncopal spell with a prolonged loss of consciousness. There wass no apparent seizure activity but she was difficult to arouse. She was found have a low blood pressure with systolic  blood pressures in the 60s when EMS arrived. She denies any preceding chest pain, palpitations. No recent exertional chest pain. She does have a history of atrial fibrillation in 2010 with no recurrence. She has been on Bisoprolol and Propafenone since that time, followed by Dr. Herbie Baltimore. She has no other cardiac history. She denied any focal neurologic changes, speech changes, or balance issues.      Hospital Course: Admitted last night with Syncope/Hypotension - Sounding Vasovagal and correlates to recent Chemo/N on Zofran/Neulasta. She was also on Cipro for a UTI and due to fear of prolonging QTc it was changed to bactrim last night based on Cx data. We recently upped her BP meds and ?how much that may have contributed.  She was better this am and we discussed strategies to get more food and water into her  on a daily basis.  No Dizzyness.  Just weak and tired.  She does not want this to delay Wednesdays chemo.    1. Syncope- vasovagal syncope in the setting of volume depletion, nausea related to her chemotherapy, and relative anorexia with fluid/food. Cardiac dysrhythmia is less likely but with her history of atrial fibrillation and the potential for interaction between Cipro and Propafenone to prolong QTc, she was observed overnight on telemetry. No Arrhythmias were identified. She ruled out for MI with 3 (-) cardiac enzymes. She was hydrated with IV fluids and provided Zofran for nausea. ECHO R/Out chemo induced CM and showed - Left ventricle: The cavity size was normal. Wall thickness was normal. Systolic function was normal. The estimated ejection fraction was in the range of 60% to 65%. Wall motion was normal; there were no regional wall motion abnormalities. Doppler parameters are consistent with abnormal left ventricular relaxation (grade 1 diastolicdysfunction). - Mitral valve: Mild regurgitation. - Left atrium: The atrium was mildly dilated. - Pulmonary arteries: Systolic pressure was  mildly We discussed strategies at home to increase fluid consumption - she maybe needs 1/2L - 1 L c each chemo dosing?.   We discussed Boost/Ensure/Fruits/Foods/Jugs of fluids/gatoraid and other options.  2. Malignant neoplasm of ovary- I notified Dr. Darrold Span of her admission . Continue chemotherapy per their protocol. Will get scans and follow up with Dr Loree Fee after her 3rd cycle. 3. History of atrial fibrillation- this was an isolated event in 2010 without recurrence on current regimen.  4. Hypertension- her antihypertensives have been scaled back. Monitor for hypotension. Obtain orthostatics. We discussed doing the Hydralazine BID prn for SBP >155 at home.  5. Hypothyroidism- continue Synthroid- most recent TSH on 5/12 was normal.  6. Disposition- discharge home today.  7. UTI - finish Bactrim based on Cx data  8 Anemia - Hbg stable and not an issue.  No Platelet or neutropenic issues at this time.      Day of Discharge Exam BP 135/68  Pulse 65  Temp(Src) 97.9 F (36.6 C) (Oral)  Resp 16  Ht 5\' 3"  (1.6 m)  Wt 59.875 kg (132 lb)  BMI 23.39 kg/m2  SpO2 98%  Physical Exam: See PN this am  Discharge Labs:  Recent Labs  04/19/13 1615 04/20/13 0255  NA 133* 135  K 3.7 3.8  CL 99 101  CO2 26 28  GLUCOSE 97 92  BUN 13 8  CREATININE 0.58 0.53  CALCIUM 8.5 8.7    Recent Labs  04/19/13 1615  AST 21  ALT 21  ALKPHOS 76  BILITOT 0.8  PROT 6.1  ALBUMIN 3.4*    Recent Labs  04/19/13 1615 04/20/13 0255  WBC 11.9* 8.4  NEUTROABS 10.9*  --   HGB 10.1* 10.3*  HCT 30.4* 30.1*  MCV 93.5 93.5  PLT 138* 164    Recent Labs  04/19/13 1615 04/19/13 2145 04/20/13 0255  TROPONINI <0.30 <0.30 <0.30   No results found for this basename: TSH, T4TOTAL, FREET3, T3FREE, THYROIDAB,  in the last 72 hours No results found for this basename: VITAMINB12, FOLATE, FERRITIN, TIBC, IRON, RETICCTPCT,  in the last 72 hours Lab Results  Component Value Date   INR 1.02  02/21/2012   INR 1.4 05/16/2009   INR 1.3 05/15/2009       Discharge instructions:  Future Appointments Provider Department Dept Phone   04/24/2013 8:30 AM Dava Najjar Idelle Jo [facility] MEDICAL ONCOLOGY [phone]   04/24/2013 9:00 AM Lennis  Buzzy Han, MD [facility] MEDICAL ONCOLOGY [phone]   04/25/2013 10:00 AM [facility] [place] [phone]   04/26/2013 10:15 AM [facility] Inj Nurse [place] [facility] MED[facility] ONCOL[facility] [phone]   05/01/2013 9:30 AM Dava Najjar Idelle Jo [facility] [phone]   05/01/2013 10:00 AM [facility] Procedure 2 [place] [facility] MED[facility] [phone]   05/02/2013 10:00 AM [facility] Inj Nurse [place] [facility] MED[facility] [phone]   05/04/2013 10:00 AM Dava Najjar Idelle Jo [facility] [phone]   05/04/2013 10:30 AM Ottie Glazier Buzzy Han, MD [place] [facility] MED[facility]COL[facility] [phone]     06-Home-Health Care Svc Follow-up Information   Follow up with Gwen Pounds, MD In 7 days.   Contact information:   [address] [facility] [place] [address] [phone]        Disposition: home  Follow-up Appts: Follow-up with Dr. Timothy Lasso at [facility] in 1-2 weeks.  Call for appointment.  Condition on Discharge: stabel  Tests Needing Follow-up: BP, orthostatics.  Time spent in discharge (includes decision making & examination of pt): 25 min  Signed: Makayleigh Poliquin M 04/20/2013, 1:24 PM

## 2013-04-20 NOTE — Progress Notes (Signed)
  Echocardiogram 2D Echocardiogram has been performed.  [name] 04/20/2013, 9:39 AM

## 2013-04-24 ENCOUNTER — Telehealth: Payer: Self-pay | Admitting: *Deleted

## 2013-04-24 ENCOUNTER — Other Ambulatory Visit: Payer: Self-pay | Admitting: *Deleted

## 2013-04-24 ENCOUNTER — Encounter: Payer: Self-pay | Admitting: Oncology

## 2013-04-24 ENCOUNTER — Ambulatory Visit (HOSPITAL_BASED_OUTPATIENT_CLINIC_OR_DEPARTMENT_OTHER): Payer: Medicare Other | Admitting: Oncology

## 2013-04-24 ENCOUNTER — Other Ambulatory Visit (HOSPITAL_BASED_OUTPATIENT_CLINIC_OR_DEPARTMENT_OTHER): Payer: Medicare Other | Admitting: Lab

## 2013-04-24 ENCOUNTER — Telehealth: Payer: Self-pay | Admitting: Oncology

## 2013-04-24 VITALS — BP 107/69 | HR 71 | Temp 97.9°F | Resp 18 | Ht 63.0 in | Wt 133.7 lb

## 2013-04-24 DIAGNOSIS — C561 Malignant neoplasm of right ovary: Secondary | ICD-10-CM

## 2013-04-24 DIAGNOSIS — E039 Hypothyroidism, unspecified: Secondary | ICD-10-CM

## 2013-04-24 DIAGNOSIS — Z5189 Encounter for other specified aftercare: Secondary | ICD-10-CM

## 2013-04-24 DIAGNOSIS — C569 Malignant neoplasm of unspecified ovary: Secondary | ICD-10-CM

## 2013-04-24 DIAGNOSIS — I10 Essential (primary) hypertension: Secondary | ICD-10-CM

## 2013-04-24 LAB — CBC WITH DIFFERENTIAL/PLATELET
Basophils Absolute: 0 10*3/uL (ref 0.0–0.1)
Eosinophils Absolute: 0 10*3/uL (ref 0.0–0.5)
HGB: 10.3 g/dL — ABNORMAL LOW (ref 11.6–15.9)
MCV: 93.3 fL (ref 79.5–101.0)
NEUT#: 1.1 10*3/uL — ABNORMAL LOW (ref 1.5–6.5)
RDW: 14.1 % (ref 11.2–14.5)
lymph#: 1.3 10*3/uL (ref 0.9–3.3)

## 2013-04-24 MED ORDER — FILGRASTIM 300 MCG/0.5ML IJ SOLN
300.0000 ug | Freq: Once | INTRAMUSCULAR | Status: AC
  Administered 2013-04-24: 300 ug via SUBCUTANEOUS
  Filled 2013-04-24: qty 0.5

## 2013-04-24 MED ORDER — DEXAMETHASONE 4 MG PO TABS: 20.0000 mg | ORAL_TABLET | ORAL | Status: DC

## 2013-04-24 NOTE — Patient Instructions (Addendum)
Zofran (ondansetron) daily for any GI discomfort this week  [place] ice, watermelon, ginger ale or whatever fluids you can, and we will give IVF after chemo if still not doing quite well with this

## 2013-04-24 NOTE — Telephone Encounter (Signed)
Labs faxed to Dr Creola Corn

## 2013-04-24 NOTE — Telephone Encounter (Signed)
Gave pt appt for lab and MD for june 2014, also IVF and injections

## 2013-04-24 NOTE — Progress Notes (Signed)
OFFICE PROGRESS NOTE   04/24/2013   Physicians:D. ClarkePearson; J.Russo, T.Fontaine, [name], [name]. 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 She began chemotherapy on 03-20-13, was neutropenic after cycle 1 and has had neupogen added (days 2,9,16) as well as decrease in carboplatin dose with cycle 2. Day 1 cycle 2 was 04-17-13. She began cipro for UTI symptoms on 04-13-13, culture resulting with > 100,000 enterobacter sensitive to cipro and to bactrim. She was hospitalized by Dr Timothy Lasso after syncopal episode on 04-19-13, with BP by EMS 60s systolic; she had not had much po prior to shopping that day and had not checked BP at home prior to taking usual hydralazine. In hospital she had no cardiac findings, antihypertensives were held and IVF given, and cipro was changed empirically to bactrim (due to possibility of QT prolongation). She was DC on 04-20-13 and is to see Dr Timothy Lasso in follow up also.   Oncologic History  Patient transferred gyn care to Dr Colin Broach in Oct 2013 with unremarkable pelvic exam then and no PAP done due to last in 2011 and no prior abnormalities; she had been on HRT since age 10. She had ~ 2 weeks vaginal spotting in late Dec 2013, then saw Dr Audie Box in late Jan 2014 after bright red spotting that AM, with uterus larger than had been apparent in Oct. Sonohystogram 01-05-13 showed uterus normal size and echotexture, endometrium 4.3 mm, left ovary normal and right adnexa with 1.1 x 8.4.x8.2 cm cystic and solid mass. Endometrial biopsy benign and CA 125 also on 01-05-13 was 178.8. She had CT AP 01-17-13 with 1.0 x 6.9 x 8.9 cm complex right ovarian mass, no ascites, small retroperitoneal nodes. She was seen by [name] on 01-18-13 and taken to surgery by [name] on 02-13-13, which was  TAH/BSO/ omentectomy/ureterolysis/ resection of cul-de-sac tumor/right pelvic lymphadnectomy and resection of rectum with reanastomosis. At completion of surgery there was no gross residual disease. 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 Washings [phone]) had rare clusters of atypical cells. Chemotherapy with dose dense taxol carboplatin was begun day 1 cycle 1 on 03-20-13. She was neutropenic after cycle 1, with cycle 2 delayed one week as well as decrease in carboplatin dose and neupogen planned days 2,9,16 with cycle 2.  She has had no further syncope. She continues with aversion to oral fluids as she has had since surgery, tho she can eat solid foods. Slight ongoing nausea, which may be worse with bactrim. She has no bladder symptoms now. Bowels are moving. No fever. No new or different pain. No bleeding. She did sleep last pm. Remainder of 10 point Review of Systems negative.  Objective:  Vital signs in last 24 hours:  BP 107/69  Pulse 71  Temp(Src) 97.9 F (36.6 C) (Oral)  Resp 18  Ht 5\' 3"  (1.6 m)  Wt 133 lb 11.2 oz (60.646 kg)  BMI 23.69 kg/m2  BP seated 108/58 with HR 67 and standing BP 107/69 with HR 71. Alert, looks tired.  HEENT:PERRLA, sclera clear, anicteric and oropharynx clear, no lesions Oral mucosa slightly dry. LymphaticsCervical, supraclavicular, and axillary nodes normal. No inguinal adenopathy Resp:  clear to auscultation bilaterally and normal percussion bilaterally Cardio: regular rate and rhythm GI: soft, non-tender; bowel sounds normal; no masses,  no organomegaly Extremities: extremities normal, atraumatic, no cyanosis or edema Neuro:no sensory deficits noted Skin without rash or ecchymosis  Lab  Results:  Results for orders placed in visit on 04/24/13  CBC WITH DIFFERENTIAL      Result Value Range   WBC 2.7 (*) 3.9 - 10.3 10e3/uL   NEUT# 1.1 (*) 1.5 - 6.5 10e3/uL   HGB 10.3 (*) 11.6 - 15.9 g/dL   HCT 95.6 (*) 21.3 - 08.6 %   Platelets 228  145 - 400 10e3/uL   MCV 93.3  79.5 - 101.0 fL   MCH 31.6  25.1 - 34.0 pg   MCHC 33.9  31.5 - 36.0 g/dL   RBC 5.78 (*) 4.69 - 6.29 10e6/uL   RDW 14.1  11.2 - 14.5 %   lymph# 1.3  0.9 - 3.3 10e3/uL   MONO# 0.2  0.1 - 0.9 10e3/uL   Eosinophils Absolute 0.0  0.0 - 0.5 10e3/uL   Basophils Absolute 0.0  0.0 - 0.1 10e3/uL   NEUT% 41.9  38.4 - 76.8 %   LYMPH% 49.6  14.0 - 49.7 %   MONO% 7.1  0.0 - 14.0 %   EOS% 0.7  0.0 - 7.0 %   BASO% 0.7  0.0 - 2.0 %    BMET 04-20-13 normal including BUN 8 and creat 0.5  Studies/Results: CT ANGIOGRAPHY CHEST 04-19-12  Comparison: 05/15/2009  Findings: Satisfactory opacification of the pulmonary arteries  noted, and there is no evidence of pulmonary emboli. No evidence  of thoracic aortic aneurysm or dissection. No evidence of  mediastinal hematoma or mass. No lymphadenopathy seen within the  thorax.  No evidence of pleural or pericardial effusion. No evidence of  pulmonary infiltrate or central endobronchial lesion. No  suspicious pulmonary nodules masses are identified. No evidence of  chest wall mass or suspicious bone lesions.  IMPRESSION:  Negative  04-19-13 noted   Medications: I have reviewed the patient's current medications. She is to take hydralazine only if sys BP >155. I have told her to use zofran daily if any GI symptoms this week.   We have discussed strategies for increasing fluids in po intake, but she may need additional IVF if po intake is not adequate as chemo continues. We will hold treatment on 5-28. I will see her again on 6-2 and she will have extra IVF x 2 hours on 05-02-13.  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  Cycle 2 delayed one week with neutropenia, with adjustments to regimen as above. Will hold day 8 cycle 2 tomorrow due to problems of the past week, but will hopefully resume next week with addition of IVF if needed. 2.HTN, elevated lipids, one episode of paroxysmal Afib. On pradaxa and ASA. HCTZ and losartan held since 04-09-13 and hydralazine now prn. Dr Timothy Lasso following also. 3.hypothyroidism on replacement  4.osteoarthritis, post right hip replacement 2013. Symptoms better with steroids used premed for taxol weekly.  5.enterobacter UTI completing antibiotics  Patient had questions answered to her satisfaction and is in agreement with plan above.    Makinna Andy P, MD   04/24/2013, 1:19 PM

## 2013-04-25 ENCOUNTER — Other Ambulatory Visit: Payer: Medicare Other | Admitting: Lab

## 2013-04-25 ENCOUNTER — Ambulatory Visit: Payer: Medicare Other

## 2013-04-26 ENCOUNTER — Ambulatory Visit: Payer: Medicare Other

## 2013-04-29 MED ORDER — SODIUM CHLORIDE 0.9 % IV SOLN: INTRAVENOUS | Status: DC

## 2013-04-30 ENCOUNTER — Encounter: Payer: Self-pay | Admitting: Oncology

## 2013-04-30 ENCOUNTER — Telehealth: Payer: Self-pay | Admitting: Gynecologic Oncology

## 2013-04-30 ENCOUNTER — Ambulatory Visit (HOSPITAL_BASED_OUTPATIENT_CLINIC_OR_DEPARTMENT_OTHER): Payer: Medicare Other | Admitting: Oncology

## 2013-04-30 ENCOUNTER — Other Ambulatory Visit: Payer: Self-pay | Admitting: *Deleted

## 2013-04-30 VITALS — BP 135/64 | HR 79 | Temp 97.7°F | Resp 18 | Ht 63.0 in | Wt 137.1 lb

## 2013-04-30 DIAGNOSIS — C569 Malignant neoplasm of unspecified ovary: Secondary | ICD-10-CM

## 2013-04-30 DIAGNOSIS — K5900 Constipation, unspecified: Secondary | ICD-10-CM

## 2013-04-30 MED ORDER — SENNA-DOCUSATE SODIUM 8.6-50 MG PO TABS: 1.0000 | ORAL_TABLET | Freq: Every day | ORAL | Status: DC

## 2013-04-30 MED ORDER — DEXAMETHASONE 4 MG PO TABS: 20.0000 mg | ORAL_TABLET | ORAL | Status: DC

## 2013-04-30 MED ORDER — ONDANSETRON HCL 8 MG PO TABS: 8.0000 mg | ORAL_TABLET | Freq: Two times a day (BID) | ORAL | Status: DC | PRN

## 2013-04-30 NOTE — Telephone Encounter (Signed)
Message left for the patient with the recommendation that she would be able to use a dulcolax suppository as needed for constipation per Dr. Stanford Breed.  Instructed to call the office for any questions or concerns.

## 2013-04-30 NOTE — Progress Notes (Signed)
OFFICE PROGRESS NOTE   04/30/2013   Physicians:D. ClarkePearson; J.Russo, T.Fontaine, [name], [name]. Last gastroenterologist in [place].   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 She is feeling much better overall since DC antihypertensives, treatment for UTI and brief hospitalization after syncopal episode 04-19-13. She is drinking bottled water without difficulty, has had no further syncope, no bladder symptoms since completion of Bactrim. Main problem for past 2 days has been constipation, with small hard BM x 1 yesterday. She has been taking zofran daily, which has significantly helped GI symptoms otherwise. She is presently taking Miralax once daily.    Oncologic History  Patient transferred gyn care to Dr Colin Broach in Oct 2013 with unremarkable pelvic exam then and no PAP done due to last in 2011 and no prior abnormalities; she had been on HRT since age 15. She had ~ 2 weeks vaginal spotting in late Dec 2013, then saw Dr Audie Box in late Jan 2014 after bright red spotting that AM, with uterus larger than had been apparent in Oct. Sonohystogram 01-05-13 showed uterus normal size and echotexture, endometrium 4.3 mm, left ovary normal and right adnexa with 1.1 x 8.4.x8.2 cm cystic and solid mass. Endometrial biopsy benign and CA 125 also on 01-05-13 was 178.8. She had CT AP 01-17-13 with 1.0 x 6.9 x 8.9 cm complex right ovarian mass, no ascites, small retroperitoneal nodes. She was seen by [name] on 01-18-13 and taken to surgery by [name] on 02-13-13, which was TAH/BSO/ omentectomy/ureterolysis/ resection of cul-de-sac tumor/right pelvic lymphadnectomy and resection of rectum with reanastomosis.  At completion of surgery there was no gross residual disease. 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 Washings [phone]) had rare clusters of atypical cells. Chemotherapy with dose dense taxol carboplatin was begun day 1 cycle 1 on 03-20-13; ANC was 1.1 on day 15 cycle 1 with taxol given and neupogen added 04-04-13.    Other than constipation x 2 days has felt well. BP 120-140 systolic. No fever, no nausea, energy much better this weekend. Remainder of 10 point Review of Systems negative.  Objective:  Vital signs in last 24 hours:  BP 135/64  Pulse 79  Temp(Src) 97.7 F (36.5 C) (Oral)  Resp 18  Ht 5\' 3"  (1.6 m)  Wt 137 lb 1.6 oz (62.188 kg)  BMI 24.29 kg/m2  SpO2 100%  Weight is up 4 lbs. Easily ambulatory, looks much better.   HEENT:PERRLA, sclera clear, anicteric and oropharynx clear, no lesions Alopecia LymphaticsCervical, supraclavicular, and axillary nodes normal. Resp: clear to auscultation bilaterally and normal percussion bilaterally Cardio: regular rate and rhythm GI: slightly distended, normal active bowel sounds, soft, not tender Extremities: extremities normal, atraumatic, no cyanosis or edema Neuro:no sensory deficits noted Skin without rash or ecchymosis   Lab Results:  Last CBC  04-24-13 with WBC 2.7, ANC 1.1, Hgb 10.3, plt 228k  Studies/Results:  No results found.  Medications: I have reviewed the patient's current medications. She will add SenokotS 1-2 tablets this afternoon and can repeat tonight, then 1-2x daily in addition to  present daily miralax. I discussed suppository with gyn oncology RN, who will check with MD and let patient know if ok to try that now.  Assessment/Plan: 1.IIB poorly  differentiated serous carcinoma involving bilateral ovaries and tubes: post optimal debulking including resection of portion of rectum with reanastomosis 02-13-13: adjuvant dose dense carbo taxol begun 03-20-13. Day 1 cycle 2 delayed one week due to neutropenia, day 8 cycle 2 delayed due to UTI and hypotension/syncope but will be given on 05-01-13 as long as ANC >=1.5 and plt >=100k. She will have neupogen day after each chemo with additional IVF if not taking po's extremely well. She will see MD again on 6-6, with day 15 cycle 2 on 05-08-13. She will be away on vacation week of 05-14-13. 2.enterobacter UTI sensitive to cipro and Bactrim, antibiotics completed and asymptomatic 3.hypotension: following BP off all antihypertensives now. Dr Ferd Hibbs assistance much appreciated 4.hypothyroidism on replacement 5.osteoarthritis and prior right hip replacement  Patient is aware that another MD will be involved after June.  [name],Tina P, MD   04/30/2013, 10:00 AM

## 2013-04-30 NOTE — Patient Instructions (Signed)
Use senokot S 2 tablets when you get home. You can repeat this or miralax this PM also if needed, trying to get good BM today. [name] will check with [name] and let you know if ok to do suppository - do not use suppository unless Tina tells you ok  Continue zofran daily Decadron five tablets with food ~ 9:30 PM tonight

## 2013-05-01 ENCOUNTER — Ambulatory Visit (HOSPITAL_BASED_OUTPATIENT_CLINIC_OR_DEPARTMENT_OTHER): Payer: Medicare Other

## 2013-05-01 ENCOUNTER — Ambulatory Visit: Payer: Medicare Other

## 2013-05-01 ENCOUNTER — Other Ambulatory Visit (HOSPITAL_BASED_OUTPATIENT_CLINIC_OR_DEPARTMENT_OTHER): Payer: Medicare Other | Admitting: Lab

## 2013-05-01 VITALS — BP 132/60 | HR 72 | Temp 97.8°F

## 2013-05-01 DIAGNOSIS — C561 Malignant neoplasm of right ovary: Secondary | ICD-10-CM

## 2013-05-01 DIAGNOSIS — C569 Malignant neoplasm of unspecified ovary: Secondary | ICD-10-CM

## 2013-05-01 DIAGNOSIS — Z5111 Encounter for antineoplastic chemotherapy: Secondary | ICD-10-CM

## 2013-05-01 LAB — CBC WITH DIFFERENTIAL/PLATELET
Basophils Absolute: 0 10*3/uL (ref 0.0–0.1)
Eosinophils Absolute: 0 10*3/uL (ref 0.0–0.5)
HCT: 33.4 % — ABNORMAL LOW (ref 34.8–46.6)
HGB: 11.2 g/dL — ABNORMAL LOW (ref 11.6–15.9)
LYMPH%: 12.2 % — ABNORMAL LOW (ref 14.0–49.7)
MCV: 94.6 fL (ref 79.5–101.0)
MONO%: 1.2 % (ref 0.0–14.0)
NEUT#: 5.9 10*3/uL (ref 1.5–6.5)
Platelets: 180 10*3/uL (ref 145–400)

## 2013-05-01 MED ORDER — SODIUM CHLORIDE 0.9 % IV SOLN
80.0000 mg/m2 | Freq: Once | INTRAVENOUS | Status: AC
  Administered 2013-05-01: 132 mg via INTRAVENOUS
  Filled 2013-05-01: qty 22

## 2013-05-01 MED ORDER — FAMOTIDINE IN NACL 20-0.9 MG/50ML-% IV SOLN
20.0000 mg | Freq: Once | INTRAVENOUS | Status: AC
  Administered 2013-05-01: 20 mg via INTRAVENOUS

## 2013-05-01 MED ORDER — SODIUM CHLORIDE 0.9 % IV SOLN
Freq: Once | INTRAVENOUS | Status: AC
  Administered 2013-05-01: 11:00:00 via INTRAVENOUS

## 2013-05-01 MED ORDER — ONDANSETRON 8 MG/50ML IVPB (CHCC)
8.0000 mg | Freq: Once | INTRAVENOUS | Status: AC
  Administered 2013-05-01: 8 mg via INTRAVENOUS

## 2013-05-01 MED ORDER — DEXAMETHASONE SODIUM PHOSPHATE 20 MG/5ML IJ SOLN
20.0000 mg | Freq: Once | INTRAMUSCULAR | Status: AC
  Administered 2013-05-01: 20 mg via INTRAVENOUS

## 2013-05-01 MED ORDER — DIPHENHYDRAMINE HCL 50 MG/ML IJ SOLN
50.0000 mg | Freq: Once | INTRAMUSCULAR | Status: AC
  Administered 2013-05-01: 50 mg via INTRAVENOUS

## 2013-05-01 NOTE — Patient Instructions (Addendum)
[facility] Discharge Instructions for Patients Receiving Chemotherapy  Today you received the following chemotherapy agents: Taxol  To help prevent nausea and vomiting after your treatment, we encourage you to take your nausea medication as directed by your MD.  If you develop nausea and vomiting that is not controlled by your nausea medication, call the clinic. If it is after clinic hours your family physician or the after hours number for the clinic or go to the Emergency Department.   BELOW ARE SYMPTOMS THAT SHOULD BE REPORTED IMMEDIATELY:  *FEVER GREATER THAN 100.5 F  *CHILLS WITH OR WITHOUT FEVER  NAUSEA AND VOMITING THAT IS NOT CONTROLLED WITH YOUR NAUSEA MEDICATION  *UNUSUAL SHORTNESS OF BREATH  *UNUSUAL BRUISING OR BLEEDING  TENDERNESS IN MOUTH AND THROAT WITH OR WITHOUT PRESENCE OF ULCERS  *URINARY PROBLEMS  *BOWEL PROBLEMS  UNUSUAL RASH Items with * indicate a potential emergency and should be followed up as soon as possible.  Feel free to call the clinic you have any questions or concerns. The clinic phone number is [phone].

## 2013-05-02 ENCOUNTER — Encounter: Payer: Self-pay | Admitting: *Deleted

## 2013-05-02 ENCOUNTER — Ambulatory Visit (HOSPITAL_BASED_OUTPATIENT_CLINIC_OR_DEPARTMENT_OTHER): Payer: Medicare Other

## 2013-05-02 ENCOUNTER — Ambulatory Visit: Payer: Medicare Other

## 2013-05-02 VITALS — BP 131/75 | HR 63 | Temp 97.8°F

## 2013-05-02 DIAGNOSIS — Z5189 Encounter for other specified aftercare: Secondary | ICD-10-CM

## 2013-05-02 DIAGNOSIS — C569 Malignant neoplasm of unspecified ovary: Secondary | ICD-10-CM

## 2013-05-02 DIAGNOSIS — C561 Malignant neoplasm of right ovary: Secondary | ICD-10-CM

## 2013-05-02 DIAGNOSIS — I10 Essential (primary) hypertension: Secondary | ICD-10-CM

## 2013-05-02 MED ORDER — FILGRASTIM 300 MCG/0.5ML IJ SOLN
300.0000 ug | Freq: Once | INTRAMUSCULAR | Status: AC
  Administered 2013-05-02: 300 ug via SUBCUTANEOUS
  Filled 2013-05-02: qty 0.5

## 2013-05-02 MED ORDER — SODIUM CHLORIDE 0.9 % IV SOLN
1000.0000 mL | Freq: Once | INTRAVENOUS | Status: AC
  Administered 2013-05-02: 1000 mL via INTRAVENOUS

## 2013-05-02 NOTE — Patient Instructions (Addendum)
Dehydration, Adult  Dehydration means your body does not have as much fluid as it needs. Your kidneys, brain, and heart will not work properly without the right amount of fluids and salt.   HOME CARE   Ask your doctor how to replace body fluid losses (rehydrate).   Drink enough fluids to keep your pee (urine) clear or pale yellow.   Drink small amounts of fluids often if you feel sick to your stomach (nauseous) or throw up (vomit).   Eat like you normally do.   Avoid:   Foods or drinks high in sugar.   Bubbly (carbonated) drinks.   Juice.   Very hot or cold fluids.   Drinks with caffeine.   Fatty, greasy foods.   Alcohol.   Tobacco.   Eating too much.   Gelatin desserts.   Wash your hands to avoid spreading germs (bacteria, viruses).   Only take medicine as told by your doctor.   Keep all doctor visits as told.  GET HELP RIGHT AWAY IF:    You cannot drink something without throwing up.   You get worse even with treatment.   Your vomit has blood in it or looks greenish.   Your poop (stool) has blood in it or looks black and tarry.   You have not peed in 6 to 8 hours.   You pee a small amount of very dark pee.   You have a fever.   You pass out (faint).   You have belly (abdominal) pain that gets worse or stays in one spot (localizes).   You have a rash, stiff neck, or bad headache.   You get easily annoyed, sleepy, or are hard to wake up.   You feel weak, dizzy, or very thirsty.  MAKE SURE YOU:    Understand these instructions.   Will watch your condition.   Will get help right away if you are not doing well or get worse.  Document Released: 09/11/2009 Document Revised: 02/07/2012 Document Reviewed: 07/05/2011  Exit[employer] Patient Information 2014 [employer].

## 2013-05-03 ENCOUNTER — Inpatient Hospital Stay (HOSPITAL_COMMUNITY)
Admission: EM | Admit: 2013-05-03 | Discharge: 2013-05-05 | DRG: 194 | Disposition: A | Discharge status: Home / Self Care | Payer: Medicare Other | Attending: Internal Medicine | Admitting: Internal Medicine

## 2013-05-03 ENCOUNTER — Emergency Department (HOSPITAL_COMMUNITY): Payer: Medicare Other

## 2013-05-03 ENCOUNTER — Inpatient Hospital Stay (HOSPITAL_COMMUNITY): Payer: Medicare Other

## 2013-05-03 ENCOUNTER — Telehealth: Payer: Self-pay

## 2013-05-03 ENCOUNTER — Other Ambulatory Visit: Payer: Self-pay

## 2013-05-03 ENCOUNTER — Encounter (HOSPITAL_COMMUNITY): Payer: Self-pay | Admitting: Emergency Medicine

## 2013-05-03 DIAGNOSIS — E871 Hypo-osmolality and hyponatremia: Secondary | ICD-10-CM | POA: Diagnosis present

## 2013-05-03 DIAGNOSIS — M1710 Unilateral primary osteoarthritis, unspecified knee: Secondary | ICD-10-CM | POA: Diagnosis present

## 2013-05-03 DIAGNOSIS — J189 Pneumonia, unspecified organism: Secondary | ICD-10-CM

## 2013-05-03 DIAGNOSIS — E785 Hyperlipidemia, unspecified: Secondary | ICD-10-CM | POA: Diagnosis present

## 2013-05-03 DIAGNOSIS — M1610 Unilateral primary osteoarthritis, unspecified hip: Secondary | ICD-10-CM | POA: Diagnosis present

## 2013-05-03 DIAGNOSIS — K5900 Constipation, unspecified: Secondary | ICD-10-CM | POA: Diagnosis present

## 2013-05-03 DIAGNOSIS — D72819 Decreased white blood cell count, unspecified: Secondary | ICD-10-CM | POA: Diagnosis present

## 2013-05-03 DIAGNOSIS — M169 Osteoarthritis of hip, unspecified: Secondary | ICD-10-CM | POA: Diagnosis present

## 2013-05-03 DIAGNOSIS — Z96649 Presence of unspecified artificial hip joint: Secondary | ICD-10-CM

## 2013-05-03 DIAGNOSIS — C569 Malignant neoplasm of unspecified ovary: Secondary | ICD-10-CM | POA: Diagnosis present

## 2013-05-03 DIAGNOSIS — R110 Nausea: Secondary | ICD-10-CM | POA: Diagnosis present

## 2013-05-03 DIAGNOSIS — I4891 Unspecified atrial fibrillation: Secondary | ICD-10-CM | POA: Diagnosis present

## 2013-05-03 DIAGNOSIS — K5909 Other constipation: Secondary | ICD-10-CM | POA: Diagnosis present

## 2013-05-03 DIAGNOSIS — IMO0002 Reserved for concepts with insufficient information to code with codable children: Secondary | ICD-10-CM

## 2013-05-03 DIAGNOSIS — R509 Fever, unspecified: Secondary | ICD-10-CM | POA: Diagnosis present

## 2013-05-03 DIAGNOSIS — E441 Mild protein-calorie malnutrition: Secondary | ICD-10-CM | POA: Diagnosis present

## 2013-05-03 DIAGNOSIS — E039 Hypothyroidism, unspecified: Secondary | ICD-10-CM | POA: Diagnosis present

## 2013-05-03 DIAGNOSIS — D61818 Other pancytopenia: Secondary | ICD-10-CM | POA: Diagnosis present

## 2013-05-03 DIAGNOSIS — N390 Urinary tract infection, site not specified: Secondary | ICD-10-CM | POA: Diagnosis present

## 2013-05-03 DIAGNOSIS — I10 Essential (primary) hypertension: Secondary | ICD-10-CM | POA: Diagnosis present

## 2013-05-03 DIAGNOSIS — Z79899 Other long term (current) drug therapy: Secondary | ICD-10-CM

## 2013-05-03 DIAGNOSIS — D696 Thrombocytopenia, unspecified: Secondary | ICD-10-CM | POA: Diagnosis present

## 2013-05-03 LAB — URINALYSIS, ROUTINE W REFLEX MICROSCOPIC
Ketones, ur: NEGATIVE mg/dL
Leukocytes, UA: NEGATIVE
Nitrite: NEGATIVE
Specific Gravity, Urine: 1.011 (ref 1.005–1.030)
Urobilinogen, UA: 0.2 mg/dL (ref 0.0–1.0)
pH: 7 (ref 5.0–8.0)

## 2013-05-03 LAB — CBC WITH DIFFERENTIAL/PLATELET
Basophils Absolute: 0 10*3/uL (ref 0.0–0.1)
Basophils Relative: 0 % (ref 0–1)
Eosinophils Absolute: 0 10*3/uL (ref 0.0–0.7)
Eosinophils Relative: 0 % (ref 0–5)
HCT: 30.3 % — ABNORMAL LOW (ref 36.0–46.0)
MCHC: 34.3 g/dL (ref 30.0–36.0)
MCV: 93.8 fL (ref 78.0–100.0)
Monocytes Absolute: 0.1 10*3/uL (ref 0.1–1.0)
RDW: 15.4 % (ref 11.5–15.5)

## 2013-05-03 LAB — COMPREHENSIVE METABOLIC PANEL
AST: 20 U/L (ref 0–37)
Albumin: 3.4 g/dL — ABNORMAL LOW (ref 3.5–5.2)
Calcium: 8.8 mg/dL (ref 8.4–10.5)
Creatinine, Ser: 0.54 mg/dL (ref 0.50–1.10)

## 2013-05-03 LAB — PROTIME-INR: INR: 1.19 (ref 0.00–1.49)

## 2013-05-03 MED ORDER — ACETAMINOPHEN 650 MG RE SUPP: 650.0000 mg | Freq: Four times a day (QID) | RECTAL | Status: DC | PRN

## 2013-05-03 MED ORDER — PROPAFENONE HCL 225 MG PO TABS
225.0000 mg | ORAL_TABLET | Freq: Two times a day (BID) | ORAL | Status: DC
  Administered 2013-05-03 – 2013-05-05 (×4): 225 mg via ORAL
  Filled 2013-05-03 (×6): qty 1

## 2013-05-03 MED ORDER — BISOPROLOL FUMARATE 5 MG PO TABS: 5.0000 mg | ORAL_TABLET | Freq: Two times a day (BID) | ORAL | Status: DC

## 2013-05-03 MED ORDER — ASPIRIN EC 81 MG PO TBEC
81.0000 mg | DELAYED_RELEASE_TABLET | Freq: Every day | ORAL | Status: DC
  Filled 2013-05-03: qty 1

## 2013-05-03 MED ORDER — VANCOMYCIN HCL 500 MG IV SOLR
500.0000 mg | Freq: Two times a day (BID) | INTRAVENOUS | Status: DC
  Administered 2013-05-04 – 2013-05-05 (×3): 500 mg via INTRAVENOUS
  Filled 2013-05-03 (×3): qty 500

## 2013-05-03 MED ORDER — VANCOMYCIN HCL IN DEXTROSE 1-5 GM/200ML-% IV SOLN
1000.0000 mg | Freq: Once | INTRAVENOUS | Status: AC
  Administered 2013-05-03: 1000 mg via INTRAVENOUS
  Filled 2013-05-03: qty 200

## 2013-05-03 MED ORDER — CLOPIDOGREL BISULFATE 75 MG PO TABS
75.0000 mg | ORAL_TABLET | Freq: Every evening | ORAL | Status: DC
  Administered 2013-05-03: 75 mg via ORAL
  Filled 2013-05-03 (×2): qty 1

## 2013-05-03 MED ORDER — POTASSIUM CHLORIDE IN NACL 20-0.9 MEQ/L-% IV SOLN
INTRAVENOUS | Status: AC
  Administered 2013-05-03 – 2013-05-04 (×2): via INTRAVENOUS
  Filled 2013-05-03 (×3): qty 1000

## 2013-05-03 MED ORDER — ENOXAPARIN SODIUM 40 MG/0.4ML ~~LOC~~ SOLN
40.0000 mg | SUBCUTANEOUS | Status: DC
  Administered 2013-05-03 – 2013-05-04 (×2): 40 mg via SUBCUTANEOUS
  Filled 2013-05-03 (×3): qty 0.4

## 2013-05-03 MED ORDER — MAGNESIUM HYDROXIDE 400 MG/5ML PO SUSP
30.0000 mL | Freq: Every day | ORAL | Status: DC
  Administered 2013-05-03 – 2013-05-04 (×2): 30 mL via ORAL
  Filled 2013-05-03 (×2): qty 30

## 2013-05-03 MED ORDER — ONDANSETRON HCL 4 MG PO TABS: 8.0000 mg | ORAL_TABLET | Freq: Two times a day (BID) | ORAL | Status: DC | PRN

## 2013-05-03 MED ORDER — ESTRADIOL 1 MG PO TABS
1.0000 mg | ORAL_TABLET | Freq: Every day | ORAL | Status: DC
  Administered 2013-05-04 – 2013-05-05 (×2): 1 mg via ORAL

## 2013-05-03 MED ORDER — ALUM & MAG HYDROXIDE-SIMETH 200-200-20 MG/5ML PO SUSP: 30.0000 mL | Freq: Four times a day (QID) | ORAL | Status: DC | PRN

## 2013-05-03 MED ORDER — PIPERACILLIN-TAZOBACTAM 3.375 G IVPB
3.3750 g | Freq: Once | INTRAVENOUS | Status: AC
  Administered 2013-05-03: 3.375 g via INTRAVENOUS
  Filled 2013-05-03: qty 50

## 2013-05-03 MED ORDER — BISACODYL 5 MG PO TBEC
5.0000 mg | DELAYED_RELEASE_TABLET | Freq: Every day | ORAL | Status: DC | PRN
  Filled 2013-05-03: qty 1

## 2013-05-03 MED ORDER — SENNA-DOCUSATE SODIUM 8.6-50 MG PO TABS: 1.0000 | ORAL_TABLET | Freq: Two times a day (BID) | ORAL | Status: DC

## 2013-05-03 MED ORDER — LEVOTHYROXINE SODIUM 88 MCG PO TABS
88.0000 ug | ORAL_TABLET | Freq: Every day | ORAL | Status: DC
  Administered 2013-05-04 – 2013-05-05 (×2): 88 ug via ORAL
  Filled 2013-05-03: qty 1

## 2013-05-03 MED ORDER — ACETAMINOPHEN 325 MG PO TABS: 650.0000 mg | ORAL_TABLET | Freq: Four times a day (QID) | ORAL | Status: DC | PRN

## 2013-05-03 MED ORDER — ONDANSETRON HCL 4 MG/2ML IJ SOLN: 4.0000 mg | Freq: Four times a day (QID) | INTRAMUSCULAR | Status: DC | PRN

## 2013-05-03 MED ORDER — ONDANSETRON HCL 8 MG PO TABS
8.0000 mg | ORAL_TABLET | Freq: Every morning | ORAL | Status: DC
  Administered 2013-05-04 – 2013-05-05 (×2): 8 mg via ORAL
  Filled 2013-05-03 (×2): qty 1

## 2013-05-03 MED ORDER — PIPERACILLIN-TAZOBACTAM 3.375 G IVPB 30 MIN
3.3750 g | Freq: Three times a day (TID) | INTRAVENOUS | Status: DC
  Administered 2013-05-04 – 2013-05-05 (×5): 3.375 g via INTRAVENOUS
  Filled 2013-05-03 (×6): qty 50

## 2013-05-03 MED ORDER — ONDANSETRON HCL 4 MG PO TABS: 4.0000 mg | ORAL_TABLET | Freq: Four times a day (QID) | ORAL | Status: DC | PRN

## 2013-05-03 MED ORDER — SENNOSIDES-DOCUSATE SODIUM 8.6-50 MG PO TABS
1.0000 | ORAL_TABLET | Freq: Two times a day (BID) | ORAL | Status: DC
  Administered 2013-05-03 – 2013-05-04 (×2): 1 via ORAL
  Filled 2013-05-03 (×5): qty 1

## 2013-05-03 MED ORDER — POLYETHYLENE GLYCOL 3350 17 G PO PACK
17.0000 g | PACK | Freq: Every day | ORAL | Status: DC
  Administered 2013-05-04: 17 g via ORAL
  Filled 2013-05-03 (×2): qty 1

## 2013-05-03 NOTE — Telephone Encounter (Signed)
[name] called stating that she began with shaking chills this afternoon.  She took her temp and it was 102.5.  No other symptoms other than no appetite.   Told her that [name] PA-C said she needed to go to the [facility] ED to be evaluated.  Pt. Verbalized understanding.

## 2013-05-03 NOTE — ED Notes (Signed)
Report attempt.  Nurse from 3W to return call

## 2013-05-03 NOTE — H&P (Signed)
[name] is an 72 y.o. female.   Chief Complaint: fever and I feel bad HPI:  The patient is a 72 year old woman with a medical history most significant for ovarian cancer on active chemotherapy with [place] Taxol who presented to the emergency room with a complaint of fever, chills, decreased appetite, and nausea. She just received her last dose of chemotherapy on 04/30/2013 with Neulasta. She was feeling her usual self until about lunchtime today when she developed fever, chills, and nausea. She discussed her symptoms with her oncology nurse who recommended emergency room evaluation. She has not had productive cough, headache, chest pain or palpitations, abdominal pain, diarrhea, dysuria, or frequency. She has had problems with constipation lately with small hard bowel movements and she's had some abdominal distention. Her medical history is otherwise significant for hypertension, dyslipidemia, hypothyroidism, osteoarthritis, and paroxysmal atrial fibrillation. On 04/19/2013 she had a brief hospital admission after an episode of syncope.  Past Medical History  Diagnosis Date  . Hypertension   . Dyslipidemia   . Hypothyroidism   . Arthritis     OSTEOARTHRITIS   -- CONSTANT PAIN RIGHT HIP---AND PAIN LEFT KNEE--PT STATES SHE GETS INJECTIONS INTO HER KNEE  . Dysrhythmia     HX OF PAROXYSMAL ATRIAL FIB  . Complication of anesthesia     BLOOD PRESSURE DROPPED WITH NASAL SURGERY, ONE OF THE CARPAL TUNNEL REPAIRS AND DURING A COLONOSCOPY  . Cancer      (Not in a hospital admission)  ADDITIONAL HOME MEDICATIONS: Status post ciprofloxacin and Bactrim for a UTI, other medications in medication administration record  PHYSICIANS INVOLVED IN CARE: Creola Corn (primary care), Jama Flavors (medical oncology), De Blanch (gynecology oncology surgery), Delynn Flavin (orthopedics)  Past Surgical History  Procedure Laterality Date  . Bilateral carpal tunnel repair  2007  . Dilation and  curettage of uterus  1969  . Surgery for ruptured ovarian cyst  1969  . Rhinoplasty for fractured nose  1986  . Left knee arthroscopy   2011  . Total hip arthroplasty  02/25/2012    Procedure: TOTAL HIP ARTHROPLASTY ANTERIOR APPROACH;  Surgeon: Kathryne Hitch, MD;  Location: [facility];  Service: Orthopedics;  Laterality: Right;  . Tonsillectomy  1962  . Laparotomy Bilateral 02/13/2013    Procedure: EXPLORATORY LAPAROTOMY TOTAL ABDOMINAL HYSTERECTOMY BILATERAL SALPINGO-OOPHORECTOMY, Partial Rectal Resection with Reanastamosis;  Surgeon: Jeannette Corpus, MD;  Location: [facility];  Service: Gynecology;  Laterality: Bilateral;  . Omentectomy  02/13/2013    Procedure: OMENTECTOMY;  Surgeon: Jeannette Corpus, MD;  Location: [facility];  Service: Gynecology;;  . Lymphadenectomy Right 02/13/2013    Procedure: PEVLIC  LYMPHADENECTOMY, DEBULKING right pelvic tumor nodules;  Surgeon: Jeannette Corpus, MD;  Location: [facility];  Service: Gynecology;  Laterality: Right;    Family History  Problem Relation Age of Onset  . Hypertension Mother      Social History:  reports that she has never smoked. She has never used smokeless tobacco. She reports that she does not drink alcohol or use illicit drugs.  Allergies:  Allergies  Allergen Reactions  . Codeine Other (See Comments)    Does not like the feeling she gets  . Lisinopril     LIP NUMBNESS     ROS: anemia, ankle swelling, Blood problems, fainting of seizures and abdomen bloating, nausea, nausea, fever and chills and  PHYSICAL EXAM: Blood pressure 123/74, pulse 84, temperature 99.6 F (37.6 C), temperature source Oral, resp. rate 22, SpO2 96.00%. In general, the  patient is a well-nourished well-developed elderly white woman who was in no apparent distress while lying partially upright in bed. HEENT exam was within normal limits, neck was supple without jugular venous distention or carotid bruit, chest was clear to auscultation,  heart had a regular rate and rhythm without significant murmur, abdomen had moderate distention with moderately decreased bowel sounds and diffuse tympany without tenderness, she had bilateral trace ankle edema with intact pedal pulses. She was alert and well oriented with normal affect and could move all extremities well.  Results for orders placed during the hospital encounter of 05/03/13 (from the past 48 hour(s))  CBC WITH DIFFERENTIAL     Status: Abnormal   Collection Time    05/03/13  3:50 PM      Result Value Range   WBC 14.4 (*) 4.0 - 10.5 K/uL   RBC 3.23 (*) 3.87 - 5.11 MIL/uL   Hemoglobin 10.4 (*) 12.0 - 15.0 g/dL   HCT 62.9 (*) 52.8 - 41.3 %   MCV 93.8  78.0 - 100.0 fL   MCH 32.2  26.0 - 34.0 pg   MCHC 34.3  30.0 - 36.0 g/dL   RDW 24.4  01.0 - 27.2 %   Platelets 104 (*) 150 - 400 K/uL   Comment: SPECIMEN CHECKED FOR CLOTS     REPEATED TO VERIFY   Neutrophils Relative % 95 (*) 43 - 77 %   Neutro Abs 13.7 (*) 1.7 - 7.7 K/uL   Lymphocytes Relative 5 (*) 12 - 46 %   Lymphs Abs 0.7  0.7 - 4.0 K/uL   Monocytes Relative 0 (*) 3 - 12 %   Monocytes Absolute 0.1  0.1 - 1.0 K/uL   Eosinophils Relative 0  0 - 5 %   Eosinophils Absolute 0.0  0.0 - 0.7 K/uL   Basophils Relative 0  0 - 1 %   Basophils Absolute 0.0  0.0 - 0.1 K/uL  COMPREHENSIVE METABOLIC PANEL     Status: Abnormal   Collection Time    05/03/13  3:50 PM      Result Value Range   Sodium 131 (*) 135 - 145 mEq/L   Potassium 3.7  3.5 - 5.1 mEq/L   Chloride 95 (*) 96 - 112 mEq/L   CO2 27  19 - 32 mEq/L   Glucose, Bld 97  70 - 99 mg/dL   BUN 12  6 - 23 mg/dL   Creatinine, Ser 5.36  0.50 - 1.10 mg/dL   Calcium 8.8  8.4 - 64.4 mg/dL   Total Protein 6.1  6.0 - 8.3 g/dL   Albumin 3.4 (*) 3.5 - 5.2 g/dL   AST 20  0 - 37 U/L   ALT 17  0 - 35 U/L   Alkaline Phosphatase 78  39 - 117 U/L   Total Bilirubin 0.6  0.3 - 1.2 mg/dL   GFR calc non Af Amer >90  >90 mL/min   GFR calc Af Amer >90  >90 mL/min   Comment:             The eGFR has been calculated     using the CKD EPI equation.     This calculation has not been     validated in all clinical     situations.     eGFR's persistently     <90 mL/min signify     possible Chronic Kidney Disease.  PROTIME-INR     Status: None   Collection Time  05/03/13  3:50 PM      Result Value Range   Prothrombin Time 14.9  11.6 - 15.2 seconds   INR 1.19  0.00 - 1.49  LACTIC ACID, PLASMA     Status: None   Collection Time    05/03/13  3:50 PM      Result Value Range   Lactic Acid, Venous 1.6  0.5 - 2.2 mmol/L   Dg Chest 2 View  05/03/2013   *RADIOLOGY REPORT*  Clinical Data: Fever.  Chemotherapy for ovarian cancer is ongoing.  CHEST - 2 VIEW  Comparison: Chest radiograph 04/19/2013.  Findings: There is faint patchy density in the right lower lobes seen on the frontal view adjacent to the right heart border and anterior to the lower thoracic spine on the lateral views suspicious for early pneumonia.  Cardiopericardial silhouette appears within normal limits.  Trachea midline.  IMPRESSION: Faint focus patchy airspace disease in the right lower lobe suspicious for early pneumonia.   Original Report Authenticated By: Andreas Newport, M.D.     Assessment/Plan #1 Fever: Positive from early pneumonia given her chest x-ray findings. However she has Not had cough or shortness of breath. Her fever could also be due to an acute abdomen infection given her abdominal exam. However first abdomen distention could be from constipation as well. We will check results of urine culture and continue Zosyn and vancomycin for now. #2 Abdomen Distention: Likely from constipation, but could also be from ileus or partial small bowel obstruction. We will check abdomen x-rays today. In addition, her treatment for constipation has been increased. If her symptoms worsen or she develops nausea and vomiting then we'll consider placing an NG tube and obtaining a CT scan of the abdomen and pelvis. #3  Protein Calorie Malnutrition: Mild and likely from her ovarian cancer and chemotherapy. We'll add nutrition supplements to her regimen.  #4 Hyponatremia: mild and should improve with IVF #5 History of Syncope: will discontinue all scheduled blood pressure meds.   Norvin Ohlin G 05/03/2013, 7:49 PM

## 2013-05-03 NOTE — Progress Notes (Signed)
ANTIBIOTIC CONSULT NOTE - INITIAL  Pharmacy Consult for Vancomycin Indication: rule out pneumonia  Allergies  Allergen Reactions  . Codeine Other (See Comments)    Does not like the feeling she gets  . Lisinopril     LIP NUMBNESS    Patient Measurements: Height: 5' 2.99" (160 cm) Weight: 137 lb 1.6 oz (62.188 kg) IBW/kg (Calculated) : 52.38  Vital Signs: Temp: 99.6 F (37.6 C) (06/05 1529) Temp src: Oral (06/05 1529) BP: 123/74 mmHg (06/05 1622) Pulse Rate: 84 (06/05 1622)  Labs:  Recent Labs  05/01/13 0948 05/03/13 1550  WBC 6.9 14.4*  HGB 11.2* 10.4*  PLT 180 104*  CREATININE  --  0.54   Estimated Creatinine Clearance: 54.9 ml/min (by C-G formula based on Cr of 0.54).  Microbiology: Recent Results (from the past 720 hour(s))  URINE CULTURE     Status: None   Collection Time    04/13/13  1:10 PM      Result Value Range Status   Urine Culture, Routine Culture, Urine   Final   Comment: Final - ===== COLONY COUNT: =====     >=100,000 COLONIES/ML     ENTEROBACTER AEROGENES          ------------------------------------------------------------------------      ENTEROBACTER AEROGENES             PIPERACILLIN/TAZO                MIC      Sensitive        <=4 ug/ml        IMIPENEM                         MIC      Sensitive          2 ug/ml        CEFAZOLIN                        MIC      Resistant       >=64 ug/ml        CEFOXITIN                        MIC      Resistant       >=64 ug/ml        CEFTRIAXONE                      MIC      Sensitive        <=1 ug/ml        CEFTAZIDIME                      MIC      Sensitive        <=1 ug/ml        CEFEPIME                         MIC      Sensitive        <=1 ug/ml        GENTAMICIN                       MIC      Sensitive        <=1 ug/ml        TOBRAMYCIN  MIC      Sensitive        <=1 ug/ml        CIPROFLOXACIN                    MIC      Sensitive     <=0.25 ug/ml        LEVOFLOXACIN                      MIC      Sensitive     <=0.12 ug/ml        NITROFURANTOIN                   MIC      Resistant        128 ug/ml        TRIMETH/SULFA                    MIC      Sensitive       <=20 ug/ml     END OF REPORT    Medical History: Past Medical History  Diagnosis Date  . Hypertension   . Dyslipidemia   . Hypothyroidism   . Arthritis     OSTEOARTHRITIS   -- CONSTANT PAIN RIGHT HIP---AND PAIN LEFT KNEE--PT STATES SHE GETS INJECTIONS INTO HER KNEE  . Dysrhythmia     HX OF PAROXYSMAL ATRIAL FIB  . Complication of anesthesia     BLOOD PRESSURE DROPPED WITH NASAL SURGERY, ONE OF THE CARPAL TUNNEL REPAIRS AND DURING A COLONOSCOPY  . Cancer     Medications:  Anti-infectives   Start     Dose/Rate Route Frequency Ordered Stop   05/03/13 2000  piperacillin-tazobactam (ZOSYN) IVPB 3.375 g     3.375 g 100 mL/hr over 30 Minutes Intravenous 4 times per day 05/03/13 1948     05/03/13 1700  vancomycin (VANCOCIN) IVPB 1000 mg/200 mL premix     1,000 mg 200 mL/hr over 60 Minutes Intravenous  Once 05/03/13 1629 05/03/13 1923   05/03/13 1700  piperacillin-tazobactam (ZOSYN) IVPB 3.375 g     3.375 g 100 mL/hr over 30 Minutes Intravenous  Once 05/03/13 1629 05/03/13 1823     Assessment: 72 yo F admit 6/5 with suspected pneumonia.  Pt had a fever of 102.5 at home and had recent chemotherapy, last on 05/01/13 for metastatic ovarian cancer.     SCr 0.54, CrCl ~ 54 ml/min CG  WBC 14.4  Vancomycin 1g loading dose and Zosyn started in ED.  Goal of Therapy:  Vancomycin trough level 15-20 mcg/ml  Plan:   Vancomycin 500mg  IV q12h.  Measure Vanc trough at steady state.  Follow up renal fxn and culture results.   Lynann Beaver PharmD, BCPS Pager [phone] 05/03/2013 7:58 PM

## 2013-05-03 NOTE — Progress Notes (Signed)
Injection was given while in infusion room.

## 2013-05-03 NOTE — ED Provider Notes (Signed)
History     CSN: [id number]  Arrival date & time 05/03/13  1506   First MD Initiated Contact with Patient 05/03/13 1533      Chief Complaint  Patient presents with  . Fever  . Chills    (Consider location/radiation/quality/duration/timing/severity/associated sxs/prior treatment) HPI Comments: Patient presents with chills at home and fever of 102. She is a history of metastatic ovarian cancer getting chemotherapy last treatment was 2 days ago. She feels generally weak but has not had any nausea, vomiting, diarrhea. She was admitted the 22nd after syncopal episode attributed to volume Depletion. She denies any chest pain, shortness of breath, cough, dysuria, hematuria. No blood in his stools. She also received Neulasta shot yesterday.  The history is provided by the patient.    Past Medical History  Diagnosis Date  . Hypertension   . Dyslipidemia   . Hypothyroidism   . Arthritis     OSTEOARTHRITIS   -- CONSTANT PAIN RIGHT HIP---AND PAIN LEFT KNEE--PT STATES SHE GETS INJECTIONS INTO HER KNEE  . Dysrhythmia     HX OF PAROXYSMAL ATRIAL FIB  . Complication of anesthesia     BLOOD PRESSURE DROPPED WITH NASAL SURGERY, ONE OF THE CARPAL TUNNEL REPAIRS AND DURING A COLONOSCOPY  . Cancer     Past Surgical History  Procedure Laterality Date  . Bilateral carpal tunnel repair  2007  . Dilation and curettage of uterus  1969  . Surgery for ruptured ovarian cyst  1969  . Rhinoplasty for fractured nose  1986  . Left knee arthroscopy   2011  . Total hip arthroplasty  02/25/2012    Procedure: TOTAL HIP ARTHROPLASTY ANTERIOR APPROACH;  Surgeon: Kathryne Hitch, MD;  Location: [facility];  Service: Orthopedics;  Laterality: Right;  . Tonsillectomy  1962  . Laparotomy Bilateral 02/13/2013    Procedure: EXPLORATORY LAPAROTOMY TOTAL ABDOMINAL HYSTERECTOMY BILATERAL SALPINGO-OOPHORECTOMY, Partial Rectal Resection with Reanastamosis;  Surgeon: Jeannette Corpus, MD;  Location: [facility];   Service: Gynecology;  Laterality: Bilateral;  . Omentectomy  02/13/2013    Procedure: OMENTECTOMY;  Surgeon: Jeannette Corpus, MD;  Location: [facility];  Service: Gynecology;;  . Lymphadenectomy Right 02/13/2013    Procedure: PEVLIC  LYMPHADENECTOMY, DEBULKING right pelvic tumor nodules;  Surgeon: Jeannette Corpus, MD;  Location: [facility];  Service: Gynecology;  Laterality: Right;    Family History  Problem Relation Age of Onset  . Hypertension Mother     History  Substance Use Topics  . Smoking status: Never Smoker   . Smokeless tobacco: Never Used  . Alcohol Use: No    OB History   Grav Para Term Preterm Abortions TAB SAB Ect Mult Living   3 2   1  1  1 3       Review of Systems  Constitutional: Positive for fever, activity change, appetite change and fatigue.  HENT: Negative for congestion and rhinorrhea.   Respiratory: Negative for cough and chest tightness.   Cardiovascular: Negative for chest pain.  Gastrointestinal: Negative for nausea, vomiting and abdominal pain.  Genitourinary: Negative for dysuria and hematuria.  Musculoskeletal: Negative for back pain.  Neurological: Positive for weakness. Negative for dizziness and light-headedness.  A complete 10 system review of systems was obtained and all systems are negative except as noted in the HPI and PMH.    Allergies  Codeine and Lisinopril  Home Medications   No current outpatient prescriptions on file.  BP 120/59  Pulse 64  Temp(Src) 98.2 F (36.8 C) (  Oral)  Resp 20  Ht 5\' 3"  (1.6 m)  Wt 134 lb 7.7 oz (61 kg)  BMI 23.83 kg/m2  SpO2 99%  Physical Exam  Constitutional: She appears well-developed and well-nourished. No distress.  HENT:  Head: Normocephalic and atraumatic.  Mouth/Throat: Oropharynx is clear and moist. No oropharyngeal exudate.  Dry mucous membranes  Eyes: Conjunctivae and EOM are normal. Pupils are equal, round, and reactive to light.  Neck: Normal range of motion. Neck supple.   Cardiovascular: Normal rate, regular rhythm and normal heart sounds.   No murmur heard. Pulmonary/Chest: Effort normal and breath sounds normal. No respiratory distress.  Abdominal: Soft. She exhibits distension. There is no tenderness. There is no rebound and no guarding.  Decreased bowel sounds  Musculoskeletal: Normal range of motion. She exhibits no edema and no tenderness.  Neurological: She is alert. No cranial nerve deficit. She exhibits normal muscle tone. Coordination normal.  CN 2-12 intact, no ataxia on finger to nose, no nystagmus, 5/5 strength throughout, no pronator drift, Romberg negative, normal gait.   Skin: Skin is warm.    ED Course  Procedures (including critical care time)  Labs Reviewed  CBC WITH DIFFERENTIAL - Abnormal; Notable for the following:    WBC 14.4 (*)    RBC 3.23 (*)    Hemoglobin 10.4 (*)    HCT 30.3 (*)    Platelets 104 (*)    Neutrophils Relative % 95 (*)    Neutro Abs 13.7 (*)    Lymphocytes Relative 5 (*)    Monocytes Relative 0 (*)    All other components within normal limits  COMPREHENSIVE METABOLIC PANEL - Abnormal; Notable for the following:    Sodium 131 (*)    Chloride 95 (*)    Albumin 3.4 (*)    All other components within normal limits  CULTURE, BLOOD (ROUTINE X 2)  CULTURE, BLOOD (ROUTINE X 2)  PROTIME-INR  URINALYSIS, ROUTINE W REFLEX MICROSCOPIC  LACTIC ACID, PLASMA   Dg Chest 2 View  05/03/2013   *RADIOLOGY REPORT*  Clinical Data: Fever.  Chemotherapy for ovarian cancer is ongoing.  CHEST - 2 VIEW  Comparison: Chest radiograph 04/19/2013.  Findings: There is faint patchy density in the right lower lobes seen on the frontal view adjacent to the right heart border and anterior to the lower thoracic spine on the lateral views suspicious for early pneumonia.  Cardiopericardial silhouette appears within normal limits.  Trachea midline.  IMPRESSION: Faint focus patchy airspace disease in the right lower lobe suspicious for early  pneumonia.   Original Report Authenticated By: Andreas Newport, M.D.   Abd 1 View (kub)  05/03/2013   *RADIOLOGY REPORT*  Clinical Data: Abdominal pain and distention.  Nausea.  ABDOMEN - 1 VIEW  Comparison: Abdomen pelvis CT dated 01/17/2013.  Findings: Normal bowel gas pattern.  Right pelvic surgical clips. Right total hip prosthesis.  Thoracolumbar scoliosis and degenerative changes.  IMPRESSION: No acute abnormality.   Original Report Authenticated By: Beckie Salts, M.D.     1. HCAP (healthcare-associated pneumonia)       MDM  Chills and fever and history of ovarian cancer on chemotherapy. Denies cough, nausea, vomiting, diarrhea.  Endorses constipation and decreased appetite.   MAXIMUM TEMPERATURE 100.1. Labs, blood cultures, chest x-ray and urinalysis obtained. leukocytosis noted. Patient recently received Neupogen injection. Denies abdominal pain or back pain. Chest x-ray shows possible right lower lobe infiltrate. Patient continues to deny cough or respiratory symptoms. Her last chemotherapy was 2 days ago. She'll  be started on broad-spectrum antibiotics for possible pneumonia. Admission discussed with Dr. Jarold Motto.        Glynn Octave, MD 05/03/13 [phone]

## 2013-05-03 NOTE — ED Notes (Signed)
Pt states that she hasnt been able to eat today bc her "stomach isn't right".  Pt also c/o of chills and fever.  Pt has ovarian cancer and was advised by cancer center to come in for further eval.

## 2013-05-04 ENCOUNTER — Other Ambulatory Visit: Payer: Medicare Other | Admitting: Lab

## 2013-05-04 ENCOUNTER — Ambulatory Visit: Payer: Medicare Other | Admitting: Oncology

## 2013-05-04 DIAGNOSIS — D649 Anemia, unspecified: Secondary | ICD-10-CM

## 2013-05-04 DIAGNOSIS — R5081 Fever presenting with conditions classified elsewhere: Secondary | ICD-10-CM

## 2013-05-04 DIAGNOSIS — C569 Malignant neoplasm of unspecified ovary: Secondary | ICD-10-CM

## 2013-05-04 DIAGNOSIS — D696 Thrombocytopenia, unspecified: Secondary | ICD-10-CM

## 2013-05-04 LAB — COMPREHENSIVE METABOLIC PANEL
AST: 13 U/L (ref 0–37)
BUN: 11 mg/dL (ref 6–23)
CO2: 32 mEq/L (ref 19–32)
Calcium: 8.3 mg/dL — ABNORMAL LOW (ref 8.4–10.5)
Creatinine, Ser: 0.64 mg/dL (ref 0.50–1.10)
GFR calc Af Amer: >90 mL/min (ref >90)
GFR calc non Af Amer: 89 mL/min — ABNORMAL LOW (ref >90)

## 2013-05-04 LAB — CBC
Hemoglobin: 9.5 g/dL — ABNORMAL LOW (ref 12.0–15.0)
Platelets: 87 10*3/uL — ABNORMAL LOW (ref 150–400)
RBC: 2.94 MIL/uL — ABNORMAL LOW (ref 3.87–5.11)
WBC: 8.1 10*3/uL (ref 4.0–10.5)

## 2013-05-04 MED ORDER — HYDRALAZINE HCL 25 MG PO TABS
12.5000 mg | ORAL_TABLET | Freq: Two times a day (BID) | ORAL | Status: DC | PRN
  Filled 2013-05-04: qty 0.5

## 2013-05-04 NOTE — Progress Notes (Signed)
PT Cancellation Note / Screen  Patient Details Name: [name] MRN: [id number] DOB: 14-Mar-1941   Cancelled Treatment:    Reason Eval/Treat Not Completed: Other (comment) Explained role of physical therapy and pt reports she has no needs at this time and declined therapy eval.  PT to sign off.   Olivia Royse,KATHrine E 05/04/2013, 2:52 PM Zenovia Jarred, PT, DPT 05/04/2013 Pager: [phone]

## 2013-05-04 NOTE — Progress Notes (Signed)
05/04/2013, 5:07 PM  Hospital day: 2 Antibiotics: zosyn Chemotherapy: day 8 cycle 2 dose dense carbo/ taxol on 05-01-13 with neupogen 300 mcg on 05-02-13.   Appreciate Dr [name] letting me know of admission yesterday with temp 102 and chills.   Oncologic History  She had ~ 2 weeks vaginal spotting in late Dec 2013, then presented to Dr [name] in late Jan 2014 after bright red spotting that AM, with uterus larger than had been apparent in Oct. Sonohystogram 01-05-13 showed uterus normal size and echotexture, endometrium 4.3 mm, left ovary normal and right adnexa with 1.1 x 8.4.x8.2 cm cystic and solid mass. Endometrial biopsy benign and CA 125 also on 01-05-13 was 178.8. She had CT AP 01-17-13 with 1.0 x 6.9 x 8.9 cm complex right ovarian mass, no ascites, small retroperitoneal nodes. She was seen by Dr [name] on 01-18-13 and taken to surgery by Dr [name] on 02-13-13, which was TAH/BSO/ omentectomy/ureterolysis/ resection of cul-de-sac tumor/right pelvic lymphadnectomy and resection of rectum with reanastomosis. At completion of surgery there was no gross residual disease. 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 Wash[facility] [phone]) had rare clusters of atypical cells. Chemotherapy with dose dense taxol carboplatin was begun day 1 cycle 1 on 03-20-13; ANC was 1.1 on day 15 cycle 1 with taxol given and neupogen added 04-04-13. She had day 1 cycle 2 treatment on 04-17-13, then was briefly hospitalized 5-22 to 04-20-13 after syncopal episode, with antihypertensive agents DCd and UTI treated. She was feeling much better at time of "day 8" cycle 2 on 05-01-13 and did have neupogen 300 mcg x 1 dose on 05-02-13.   Subjective: Feeling better today. Queasiness  resolved, bowels have moved with formed stools ~ 3x today with increase in laxatives. Has eaten regular meals and has walked in hall x 1. No further shaking chills. No SOB or respiratory symptoms. No bladder symptoms. No bleeding.   She does not have portacath.  Objective: Vital signs in last 24 hours: Blood pressure 105/71, pulse 71, temperature 97.6 F (36.4 C), temperature source Oral, resp. rate 18, height 5\' 3"  (1.6 m), weight 134 lb 7.7 oz (61 kg), SpO2 100.00%.  Awake, alert, looks comfortable in bed reading. Husband here. Intake/Output from previous day: 06/05 0701 - 06/06 0700 In: 1187.5 [Owens.O.:480; I.V.:657.5; IV Piggyback:50] Out: 800 [Urine:800] Intake/Output this shift: Total I/O In: 787.5 [Owens.O.:360; I.V.:377.5; IV Piggyback:50] Out: 1250 [Urine:1250]  Physical exam: partial alopecia. PERRL. Oral mucosa moist. Peripheral IV ok.Heart RRR. Lungs without wheezes or rales. Abdomen with normal bowel sounds, softer, not tender. LE no edema, cords.  Lab Results:  Recent Labs  05/03/13 1550 05/04/13 0536  WBC 14.4* 8.1  HGB 10.4* 9.5*  HCT 30.3* 27.7*  PLT 104* 87*   BMET  Recent Labs  05/03/13 1550 05/04/13 0536  NA 131* 134*  K 3.7 4.3  CL 95* 100  CO2 27 32  GLUCOSE 97 92  BUN 12 11  CREATININE 0.54 0.64  CALCIUM 8.8 8.3*   Blood culture pending. Urine culture not sent and I will repeat that (on antibiotics) now.  Studies/Results: Dg Chest 2 View  05/03/2013   *RADIOLOGY REPORT*  Clinical Data: Fever.  Chemotherapy for ovarian cancer is ongoing.  CHEST - 2 VIEW  Comparison: Chest radiograph 04/19/2013.  Findings:  There is faint patchy density in the right lower lobes seen on the frontal view adjacent to the right heart border and anterior to the lower thoracic spine on the lateral views suspicious for early pneumonia.  Cardiopericardial silhouette appears within normal limits.  Trachea midline.  IMPRESSION: Faint focus patchy airspace disease in the right  lower lobe suspicious for early pneumonia.   Original Report Authenticated By: [name], M.D.   Abd 1 View (kub)  05/03/2013   *RADIOLOGY REPORT*  Clinical Data: Abdominal pain and distention.  Nausea.  ABDOMEN - 1 VIEW  Comparison: Abdomen pelvis CT dated 01/17/2013.  Findings: Normal bowel gas pattern.  Right pelvic surgical clips. Right total hip prosthesis.  Thoracolumbar scoliosis and degenerative changes.  IMPRESSION: No acute abnormality.   Original Report Authenticated By: [name], M.D.     Assessment/Plan: 1. Fever and chills in patient recently post surgery for ovarian cancer including partial resection of rectum and on chemotherapy. WBC ok, platelets decreasing without bleeding, anemia noted. Will recheck CBC with diff in AM. Continuing zosyn, awaiting culture results. 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 She is due day 15 cycle 2 taxol on 6-10 if stable and counts adequate. If she is DC over weekend, I have asked her to call my RN on 05-07-13 to let us know how she is, to decide about treatment. Note she is to be away on vacation week of 6-16.  3.No orthostatic symptoms today. Drinking fluids better. BP not elevated off antihypertensives now 4.recent enterobacter UTI treated with cipro then Bactrim 5.hypothyroidism on replacement 6.osteoarthritis and right hip replacement 7. History of one episode A fib some years ago  Please call if our service can help on 05-05-13. I am on call 05-06-13 and will see her then if still in hospital.  Thanks   [name] [phone]

## 2013-05-04 NOTE — Progress Notes (Signed)
INITIAL NUTRITION ASSESSMENT  DOCUMENTATION CODES Per approved criteria  -Not Applicable   INTERVENTION: - Encouraged pt to continue to eat 100% of meals - Discussed strategies to help with changes in taste from chemotherapy - Recommend MD order Biotene rinse - Will continue to monitor   NUTRITION DIAGNOSIS: Increased nutrient needs related to ovarian CA on chemotherapy as evidenced by MD notes.   Goal: Pt to consume >90% of meals  Monitor:  Weights, labs, intake  Reason for Assessment: Consult  72 y.o. female  Admitting Dx: Fever  ASSESSMENT: Pt with ovarian CA on active chemotherapy. Met with pt who reports eating 3 meals/day at home but states sometimes she could not eat 100% of meals. Pt denies nausea but states sometimes her stomach is in knots and makes her unable to eat/drink. Pt reports having changes in taste from chemotherapy and metallic taste. Pt reports her weight has been stable. Pt reports her appetite has improved since admission and reports eating 100% of breakfast and lunch today.   Height: Ht Readings from Last 1 Encounters:  05/03/13 5\' 3"  (1.6 m)    Weight: Wt Readings from Last 1 Encounters:  05/03/13 134 lb 7.7 oz (61 kg)    Ideal Body Weight: 115 lb  % Ideal Body Weight: 116  Wt Readings from Last 10 Encounters:  05/03/13 134 lb 7.7 oz (61 kg)  04/30/13 137 lb 1.6 oz (62.188 kg)  04/24/13 133 lb 11.2 oz (60.646 kg)  04/19/13 132 lb (59.875 kg)  04/09/13 135 lb 9.6 oz (61.508 kg)  04/03/13 135 lb 6.4 oz (61.417 kg)  03/23/13 135 lb (61.236 kg)  03/07/13 133 lb 12.8 oz (60.691 kg)  02/26/13 134 lb 14.4 oz (61.19 kg)  02/13/13 139 lb (63.05 kg)    Usual Body Weight: 134 lb   % Usual Body Weight: 100  BMI:  Body mass index is 23.83 kg/(m^2).  Estimated Nutritional Needs: Kcal: 1525-1850 Protein: 60-75g Fluid: 1.5-1.8L/day  Skin: Intact   Diet Order: General  EDUCATION NEEDS: -Education needs addressed - discussed diet  therapy for taste changes from chemotherapy   Intake/Output Summary (Last 24 hours) at 05/04/13 1531 Last data filed at 05/04/13 1300  Gross per 24 hour  Intake   1975 ml  Output   2050 ml  Net    -75 ml    Last BM: 6/5  Labs:   Recent Labs Lab 05/03/13 1550 05/04/13 0536  NA 131* 134*  K 3.7 4.3  CL 95* 100  CO2 27 32  BUN 12 11  CREATININE 0.54 0.64  CALCIUM 8.8 8.3*  GLUCOSE 97 92    CBG (last 3)  No results found for this basename: GLUCAP,  in the last 72 hours  Scheduled Meds: . enoxaparin (LOVENOX) injection  40 mg Subcutaneous Q24H  . estradiol  1 mg Oral Daily  . levothyroxine  88 mcg Oral QAC breakfast  . magnesium hydroxide  30 mL Oral Daily  . ondansetron  8 mg Oral q morning - 10a  . piperacillin-tazobactam  3.375 g Intravenous Q8H  . polyethylene glycol  17 g Oral Daily  . propafenone  225 mg Oral BID  . senna-docusate  1 tablet Oral BID  . vancomycin  500 mg Intravenous Q12H    Continuous Infusions: . 0.9 % NaCl with KCl 20 mEq / L 40 mL/hr at 05/04/13 6045    Past Medical History  Diagnosis Date  . Hypertension   . Dyslipidemia   . Hypothyroidism   .  Arthritis     OSTEOARTHRITIS   -- CONSTANT PAIN RIGHT HIP---AND PAIN LEFT KNEE--PT STATES SHE GETS INJECTIONS INTO HER KNEE  . Dysrhythmia     HX OF PAROXYSMAL ATRIAL FIB  . Complication of anesthesia     BLOOD PRESSURE DROPPED WITH NASAL SURGERY, ONE OF THE CARPAL TUNNEL REPAIRS AND DURING A COLONOSCOPY  . Cancer     Past Surgical History  Procedure Laterality Date  . Bilateral carpal tunnel repair  2007  . Dilation and curettage of uterus  1969  . Surgery for ruptured ovarian cyst  1969  . Rhinoplasty for fractured nose  1986  . Left knee arthroscopy   2011  . Total hip arthroplasty  02/25/2012    Procedure: TOTAL HIP ARTHROPLASTY ANTERIOR APPROACH;  Surgeon: Kathryne Hitch, MD;  Location: [facility];  Service: Orthopedics;  Laterality: Right;  . Tonsillectomy  1962  .  Laparotomy Bilateral 02/13/2013    Procedure: EXPLORATORY LAPAROTOMY TOTAL ABDOMINAL HYSTERECTOMY BILATERAL SALPINGO-OOPHORECTOMY, Partial Rectal Resection with Reanastamosis;  Surgeon: Jeannette Corpus, MD;  Location: [facility];  Service: Gynecology;  Laterality: Bilateral;  . Omentectomy  02/13/2013    Procedure: OMENTECTOMY;  Surgeon: Jeannette Corpus, MD;  Location: [facility];  Service: Gynecology;;  . Lymphadenectomy Right 02/13/2013    Procedure: PEVLIC  LYMPHADENECTOMY, DEBULKING right pelvic tumor nodules;  Surgeon: Jeannette Corpus, MD;  Location: [facility];  Service: Gynecology;  Laterality: Right;     Levon Hedger MS, RD, LDN [phone] Pager [phone] After Hours Pager

## 2013-05-04 NOTE — Progress Notes (Addendum)
Subjective: Sent to ED yesterday and admitted with HC Aq PNA while actively receiving Chemo, Fever up to 102.5, Chills, Rigors, Ab Discomfort, fatigue, anorexia, and weakness. No cough or SOB.  S/P BM after MOM.  She looks and feels better.  She is disappointed that she is staying 24 more hrs as she was hoping for D/c now.   Objective: Vital signs in last 24 hours: Temp:  [98.2 F (36.8 C)-100.1 F (37.8 C)] 98.2 F (36.8 C) (06/05 2105) Pulse Rate:  [64-84] 64 (06/05 2105) Resp:  [16-22] 20 (06/05 2105) BP: (103-131)/(56-74) 120/59 mmHg (06/05 2105) SpO2:  [95 %-99 %] 99 % (06/05 2105) Weight:  [61 kg (134 lb 7.7 oz)-62.188 kg (137 lb 1.6 oz)] 61 kg (134 lb 7.7 oz) (06/05 2105) Weight change:  Last BM Date: 05/03/13 (small amount per pt)  CBG (last 3)  No results found for this basename: GLUCAP,  in the last 72 hours  Intake/Output from previous day:  Intake/Output Summary (Last 24 hours) at 05/04/13 0713 Last data filed at 05/04/13 0545  Gross per 24 hour  Intake 1137.5 ml  Output    800 ml  Net  337.5 ml   06/05 0701 - 06/06 0700 In: 1137.5 [P.O.:480; I.V.:657.5] Out: 800 [Urine:800]   Physical Exam General appearance: A and O.  Looks better than expected. Eyes: no scleral icterus Throat: oropharynx moist without erythema Resp: CTA B Cardio: Reg GI: soft, non-tender; bowel sounds normal; no masses,  no organomegaly.  Less Distended.  No pain Extremities: no clubbing, cyanosis or edema   Lab Results:  Recent Labs  05/03/13 1550 05/04/13 0536  NA 131* 134*  K 3.7 4.3  CL 95* 100  CO2 27 32  GLUCOSE 97 92  BUN 12 11  CREATININE 0.54 0.64  CALCIUM 8.8 8.3*     Recent Labs  05/03/13 1550 05/04/13 0536  AST 20 13  ALT 17 14  ALKPHOS 78 62  BILITOT 0.6 0.7  PROT 6.1 5.4*  ALBUMIN 3.4* 2.9*     Recent Labs  05/01/13 0948 05/03/13 1550 05/04/13 0536  WBC 6.9 14.4* 8.1  NEUTROABS 5.9 13.7*  --   HGB 11.2* 10.4* 9.5*  HCT 33.4* 30.3* 27.7*   MCV 94.6 93.8 94.2  PLT 180 104* 87*    Lab Results  Component Value Date   INR 1.19 05/03/2013   INR 1.02 02/21/2012   INR 1.4 05/16/2009    No results found for this basename: CKTOTAL, CKMB, CKMBINDEX, TROPONINI,  in the last 72 hours  No results found for this basename: TSH, T4TOTAL, FREET3, T3FREE, THYROIDAB,  in the last 72 hours  No results found for this basename: VITAMINB12, FOLATE, FERRITIN, TIBC, IRON, RETICCTPCT,  in the last 72 hours  Micro Results: No results found for this or any previous visit (from the past 240 hour(s)).   Studies/Results: Dg Chest 2 View  05/03/2013   *RADIOLOGY REPORT*  Clinical Data: Fever.  Chemotherapy for ovarian cancer is ongoing.  CHEST - 2 VIEW  Comparison: Chest radiograph 04/19/2013.  Findings: There is faint patchy density in the right lower lobes seen on the frontal view adjacent to the right heart border and anterior to the lower thoracic spine on the lateral views suspicious for early pneumonia.  Cardiopericardial silhouette appears within normal limits.  Trachea midline.  IMPRESSION: Faint focus patchy airspace disease in the right lower lobe suspicious for early pneumonia.   Original Report Authenticated By: Andreas Newport, M.D.   Abd  1 View (kub)  05/03/2013   *RADIOLOGY REPORT*  Clinical Data: Abdominal pain and distention.  Nausea.  ABDOMEN - 1 VIEW  Comparison: Abdomen pelvis CT dated 01/17/2013.  Findings: Normal bowel gas pattern.  Right pelvic surgical clips. Right total hip prosthesis.  Thoracolumbar scoliosis and degenerative changes.  IMPRESSION: No acute abnormality.   Original Report Authenticated By: Beckie Salts, M.D.     Medications: Scheduled: . aspirin EC  81 mg Oral Daily  . clopidogrel  75 mg Oral QPM  . enoxaparin (LOVENOX) injection  40 mg Subcutaneous Q24H  . estradiol  1 mg Oral Daily  . levothyroxine  88 mcg Oral QAC breakfast  . magnesium hydroxide  30 mL Oral Daily  . ondansetron  8 mg Oral q morning - 10a  .  piperacillin-tazobactam  3.375 g Intravenous Q8H  . polyethylene glycol  17 g Oral Daily  . propafenone  225 mg Oral BID  . senna-docusate  1 tablet Oral BID  . vancomycin  500 mg Intravenous Q12H   Continuous: . 0.9 % NaCl with KCl 20 mEq / L 75 mL/hr at 05/03/13 2059     Assessment/Plan: Principal Problem:   Fever Active Problems:   Malignant neoplasm of ovary   Hypertension   Nausea   Constipation  HCAPNA - CXR c/w Faint focus patchy airspace disease in the right lower lobe suspicious for early pneumonia.  WBC up to 14.5 and improved this am.  No cough or shortness of breath. Due to the fever and lack of Sxs despite the + x-ray we will rule out other possible causes of her illness including urine culture - UA was (-).  F/Up Blood Cxs.  Continue Zosyn and Vancomycin for empiric Abx. Will ask Dr Darrold Span to help and advise during this admission.  Of course all of her Sxs could have been Neulasta driven and she may not even need the Abx or prolonged stay.  Ab Discomfort c Distension- KUB (-). LFTs fine x low protein and albumin.  Bowel Prep helped.  No current NGT needed at this time.  Protein calorie malnutrition. - Nutrition consult ordered  Hyponatremia: mild and improved to 134 with IVF  Recent vasovagal syncope in the setting of volume depletion, nausea related to her chemotherapy, and relative anorexia with fluid/food. ECHO shoed Nml EF.   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 Darrold Span.  The plan on 04/30/13 was: She is s/post optimal debulking including resection of portion of rectum with reanastomosis 02-13-13: adjuvant dose dense carbo taxol begun 03-20-13. Day 1 cycle 2 delayed one week due to neutropenia, day 8 cycle 2 delayed due to UTI and hypotension/syncope but was given on 05-01-13. She had neupogen day after each chemo with additional IVF if not taking po's extremely well. She will see MD again on 6-6 (this will not  happen today in the outpatient setting), with day 15 cycle 2 on 05-08-13 (May be held). She will be away on vacation week of 05-14-13  To [facility] and she would like more than anything to get there.  History of atrial fibrillation- this was an isolated event in 2010 without recurrence on current regimen. On pradaxa and ASA. Rhythmol.  Hypertension- her antihypertensives are on hold and we can do Hydralazine BID prn for SBP >155 if needed. Current BPs look great.   Hypothyroidism- continue Synthroid  Constipation - bowel prep.  Disposition- discharge home when better.  Maybe tomorrow or  Sunday.   Recent UTI - S/P Bactrim . Changed from Cipro for fear of Qtc prolongation.  Anemia - Hbg down to 9.5 and may lower more.  Same for Thrombocytopenia c platelets of 87. Not neutropenic yet. With platelets dropping - Hold ASA and Plavix   ID -  Anti-infectives   Start     Dose/Rate Route Frequency Ordered Stop   05/04/13 0600  vancomycin (VANCOCIN) 500 mg in sodium chloride 0.9 % 100 mL IVPB     500 mg 100 mL/hr over 60 Minutes Intravenous Every 12 hours 05/03/13 2048     05/04/13 0000  piperacillin-tazobactam (ZOSYN) IVPB 3.375 g     3.375 g 100 mL/hr over 30 Minutes Intravenous Every 8 hours 05/03/13 1948     06 /05/14 1700  vancomycin (VANCOCIN) IVPB 1000 mg/200 mL premix     1,000 mg 200 mL/hr over 60 Minutes Intravenous  Once 05/03/13 1629 05/03/13 1923   05/03/13 1700  piperacillin-tazobactam (ZOSYN) IVPB 3.375 g     3.375 g 100 mL/hr over 30 Minutes Intravenous  Once 05/03/13 1629 05/03/13 1823     DVT Prophylaxis - lovenox    LOS: 1 day   Slater Mcmanaman M 05/04/2013, 7:13 AM

## 2013-05-05 ENCOUNTER — Inpatient Hospital Stay (HOSPITAL_COMMUNITY): Payer: Medicare Other

## 2013-05-05 LAB — COMPREHENSIVE METABOLIC PANEL
ALT: 14 U/L (ref 0–35)
AST: 12 U/L (ref 0–37)
Alkaline Phosphatase: 57 U/L (ref 39–117)
CO2: 29 mEq/L (ref 19–32)
Chloride: 100 mEq/L (ref 96–112)
GFR calc Af Amer: >90 mL/min (ref >90)
GFR calc non Af Amer: 89 mL/min — ABNORMAL LOW (ref >90)
Glucose, Bld: 95 mg/dL (ref 70–99)
Potassium: 3.6 mEq/L (ref 3.5–5.1)
Sodium: 134 mEq/L — ABNORMAL LOW (ref 135–145)

## 2013-05-05 LAB — CBC WITH DIFFERENTIAL/PLATELET
Basophils Absolute: 0 10*3/uL (ref 0.0–0.1)
Basophils Relative: 0 % (ref 0–1)
Eosinophils Absolute: 0 10*3/uL (ref 0.0–0.7)
MCH: 31.4 pg (ref 26.0–34.0)
MCHC: 33.6 g/dL (ref 30.0–36.0)
Monocytes Absolute: 0.1 10*3/uL (ref 0.1–1.0)
Monocytes Relative: 1 % — ABNORMAL LOW (ref 3–12)
Neutro Abs: 3.2 10*3/uL (ref 1.7–7.7)
Neutrophils Relative %: 65 % (ref 43–77)
RDW: 15.2 % (ref 11.5–15.5)

## 2013-05-05 MED ORDER — CLOPIDOGREL BISULFATE 75 MG PO TABS: ORAL_TABLET | ORAL | Status: DC

## 2013-05-05 MED ORDER — BISOPROLOL FUMARATE 5 MG PO TABS: ORAL_TABLET | ORAL | Status: DC

## 2013-05-05 MED ORDER — POTASSIUM CHLORIDE ER 10 MEQ PO TBCR: 10.0000 meq | EXTENDED_RELEASE_TABLET | Freq: Every day | ORAL | Status: DC

## 2013-05-05 MED ORDER — HYDRALAZINE HCL 25 MG PO TABS: ORAL_TABLET | ORAL | Status: DC

## 2013-05-05 MED ORDER — BISACODYL 5 MG PO TBEC: 5.0000 mg | DELAYED_RELEASE_TABLET | Freq: Every day | ORAL | Status: DC | PRN

## 2013-05-05 MED ORDER — ACETAMINOPHEN 325 MG PO TABS: 650.0000 mg | ORAL_TABLET | Freq: Four times a day (QID) | ORAL | Status: DC | PRN

## 2013-05-05 MED ORDER — CEFDINIR 300 MG PO CAPS: 300.0000 mg | ORAL_CAPSULE | Freq: Two times a day (BID) | ORAL | Status: DC

## 2013-05-05 NOTE — Discharge Summary (Signed)
Physician Discharge Summary  DISCHARGE SUMMARY   Patient ID: [name] MR#: [id number] DOB/AGE: [birth year] 72 y.o.   Attending Physician:Jeanenne Licea M  Patient's ION:[name],[name], MD  Consults:Treatment Team:  Reece Packer, MD**  Admit date: 05/03/2013 Discharge date: 05/05/2013  Discharge Diagnoses:  Principal Problem:   Fever Active Problems:   Malignant neoplasm of ovary   Hypertension   Nausea   Constipation   Patient Active Problem List   Diagnosis Date Noted  . Fever 05/03/2013    Class: Acute  . Nausea 05/03/2013    Class: Acute  . Constipation 05/03/2013    Class: Acute  . Syncope 04/19/2013  . History of atrial fibrillation 04/19/2013  . Hypertension 04/19/2013  . Hypothyroidism 04/19/2013  . Malignant neoplasm of ovary 03/11/2013  . Elevated cancer antigen 125 (CA 125) 01/05/2013  . Degenerative arthritis of hip 02/25/2012   Past Medical History  Diagnosis Date  . Hypertension   . Dyslipidemia   . Hypothyroidism   . Arthritis     OSTEOARTHRITIS   -- CONSTANT PAIN RIGHT HIP---AND PAIN LEFT KNEE--PT STATES SHE GETS INJECTIONS INTO HER KNEE  . Dysrhythmia     HX OF PAROXYSMAL ATRIAL FIB  . Complication of anesthesia     BLOOD PRESSURE DROPPED WITH NASAL SURGERY, ONE OF THE CARPAL TUNNEL REPAIRS AND DURING A COLONOSCOPY  . Cancer     Discharged Condition: good   Discharge Medications:   Medication List    STOP taking these medications       PRESCRIPTION MEDICATION      TAKE these medications       acetaminophen 325 MG tablet  Commonly known as:  TYLENOL  Take 2 tablets (650 mg total) by mouth every 6 (six) hours as needed.     aspirin EC 81 MG tablet  Take 81 mg by mouth daily.     bisacodyl 5 MG EC tablet  Commonly known as:  DULCOLAX  Take 1 tablet (5 mg total) by mouth daily as needed.     bisoprolol 5 MG tablet  Commonly known as:  ZEBETA  Restart Zebeta if Systolic BP > 140. Take 1 tablet (5 mg total) by mouth 2  (two) times daily.     cefdinir 300 MG capsule  Commonly known as:  OMNICEF  Take 1 capsule (300 mg total) by mouth 2 (two) times daily.     clopidogrel 75 MG tablet  Commonly known as:  PLAVIX  Hold Plavix until after Chemo complete then resume 1 tablet (75 mg total) by mouth every evening     dexamethasone 4 MG tablet  Commonly known as:  DECADRON  Take 5 tablets (20 mg total) by mouth once a week. Take 12 hours prior to Taxol or as directed.     estradiol 1 MG tablet  Commonly known as:  ESTRACE  Take 1 tablet (1 mg total) by mouth every morning. PT STATES SHE WANTS ESTRACE--NOT THE GENERIC     GLUCOSAMINE HCL PO  Take 1,000 mg by mouth daily.     hydrALAZINE 25 MG tablet  Commonly known as:  APRESOLINE  2 times daily only if SBP > 155     levothyroxine 88 MCG tablet  Commonly known as:  SYNTHROID, LEVOTHROID  Take 88 mcg by mouth every morning. PT STATES SHE NEEDS THE SYNTHROID--DOES NOT WANT THE GENERIC     multivitamin with minerals Tabs  Take 1 tablet by mouth every morning.     ondansetron 8  MG tablet  Commonly known as:  ZOFRAN  Take 1-2 tablets (8-16 mg total) by mouth every 12 (twelve) hours as needed for nausea.     polyethylene glycol packet  Commonly known as:  MIRALAX / GLYCOLAX  Take 17 g by mouth daily.     potassium chloride 10 MEQ tablet  Commonly known as:  KLOR-CON 10  Take 1 tablet (10 mEq total) by mouth daily.     propafenone 225 MG tablet  Commonly known as:  RYTHMOL  Take 225 mg by mouth 2 (two) times daily. 9am9pm     sennosides-docusate sodium 8.6-50 MG tablet  Commonly known as:  SENOKOT-S  Take 1 tablet by mouth 2 (two) times daily.        Hospital Procedures: Dg Chest 2 View  05/05/2013   *RADIOLOGY REPORT*  Clinical Data: Pneumonia follow up  CHEST - 2 VIEW  Comparison: Prior chest x-ray 05/03/2013  Findings: The previously described faint patchy opacity in the right lower lobe appears less conspicuous on today's exam.  Cardiac  and mediastinal contours within normal limits.  Mild multilevel degenerative change throughout the thoracic spine.  Central bronchitic changes are stable.  Mild dextroconvex scoliosis of the upper lumbar spine.  No acute osseous abnormality.  IMPRESSION:  The previously described faint focus of patchy airspace disease in the right lower lobe is less conspicuous on today's exam.  This may reflect resolving infiltrate or atelectasis.   Original Report Authenticated By: Malachy Moan, M.D.   Dg Chest 2 View  05/03/2013   *RADIOLOGY REPORT*  Clinical Data: Fever.  Chemotherapy for ovarian cancer is ongoing.  CHEST - 2 VIEW  Comparison: Chest radiograph 04/19/2013.  Findings: There is faint patchy density in the right lower lobes seen on the frontal view adjacent to the right heart border and anterior to the lower thoracic spine on the lateral views suspicious for early pneumonia.  Cardiopericardial silhouette appears within normal limits.  Trachea midline.  IMPRESSION: Faint focus patchy airspace disease in the right lower lobe suspicious for early pneumonia.   Original Report Authenticated By: Andreas Newport, M.D.   Dg Chest 2 View  04/19/2013   *RADIOLOGY REPORT*  Clinical Data: Loss of consciousness.  Generalized weakness. Current history of ovarian cancer.  CHEST - 2 VIEW  Comparison: Two-view chest x-ray 02/21/2012.  Findings: Cardiac silhouette upper normal in size, unchanged, allowing for differences in technique.  Hilar and mediastinal contours otherwise unremarkable.  Lungs clear.  Bronchovascular markings normal.  Pulmonary vascularity normal.  No pneumothorax. No pleural effusions.  IMPRESSION: No acute cardiopulmonary disease.   Original Report Authenticated By: Hulan Saas, M.D.   Abd 1 View (kub)  05/03/2013   *RADIOLOGY REPORT*  Clinical Data: Abdominal pain and distention.  Nausea.  ABDOMEN - 1 VIEW  Comparison: Abdomen pelvis CT dated 01/17/2013.  Findings: Normal bowel gas pattern.  Right  pelvic surgical clips. Right total hip prosthesis.  Thoracolumbar scoliosis and degenerative changes.  IMPRESSION: No acute abnormality.   Original Report Authenticated By: Beckie Salts, M.D.   Ct Angio Chest Pe W/cm &/or Wo Cm  04/19/2013   *RADIOLOGY REPORT*  Clinical Data: Syncope.  Hypotension.  Ovarian carcinoma undergoing chemotherapy.  CT ANGIOGRAPHY CHEST  Technique:  Multidetector CT imaging of the chest using the standard protocol during bolus administration of intravenous contrast. Multiplanar reconstructed images including MIPs were obtained and reviewed to evaluate the vascular anatomy.  Contrast: OMNIPAQUE IOHEXOL 350 MG/ML SOLN  Comparison: 05/15/2009  Findings: Satisfactory opacification  of the pulmonary arteries noted, and there is no evidence of pulmonary emboli.  No evidence of thoracic aortic aneurysm or dissection.  No evidence of mediastinal hematoma or mass.  No lymphadenopathy seen within the thorax.  No evidence of pleural or pericardial effusion.  No evidence of pulmonary infiltrate or central endobronchial lesion.  No suspicious pulmonary nodules masses are identified.  No evidence of chest wall mass or suspicious bone lesions.  IMPRESSION: Negative.  No evidence of pulmonary embolism or other active disease.   Original Report Authenticated By: Myles Rosenthal, M.D.    History of Present Illness: Sent to ED 05/03/13 and admitted with HC Aq PNA while actively receiving Chemo.  She was feeling well at time of "day 8" cycle 2 on 05-01-13 and did have neupogen 300 mcg x 1 dose on 05-02-13.  She presented c Fever up to 102.5, Chills, Rigors, Ab Discomfort, fatigue, anorexia, and weakness. No cough or SOB. Constipation required MOM and S/P BM.   Hospital Course:  HCAPNA? - CXR c/w Faint focus patchy airspace disease in the right lower lobe suspicious for early pneumonia but improved on todays follow up CXR. WBC up to 14.5 on admission and fell into 4's this am. No cough or shortness of  breath. Await Cxs. I still had her on Zosyn and Vancomycin for empiric Abx as of this am and am willing to D/C on 7 day course of Omnicef. Appreciate Dr Henreitta Leber help. Of course all of her Sxs could have been Neulasta driven and she may not even need the Abx or prolonged stay. She was instructed to call Dr Darrold Span on Monday and Get labs by Tuesday to F/Up.   Ab Discomfort c Distension Better- KUB (-). LFTs fine x low protein and albumin. Bowel Prep helped. No NGT needed.   Protein calorie malnutrition. - Nutrition consult ordered: - Encouraged pt to continue to eat 100% of meals  - Discussed strategies to help with changes in taste from chemotherapy  - Recommend MD order Biotene rinse  - Will continue to monitor    Hyponatremia: mild and stable at 134   Recent vasovagal syncope in the setting of volume depletion, UTI requiring Abx, nausea related to her chemotherapy, and relative anorexia with fluid/food. ECHO showed Nml EF.   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 Darrold Span. The plan on 04/30/13 was: She is s/post optimal debulking including resection of portion of rectum with reanastomosis 02-13-13: adjuvant dose dense carbo taxol begun 03-20-13. Day 1 cycle 2 delayed one week due to neutropenia, day 8 cycle 2 delayed due to UTI and hypotension/syncope but was given on 05-01-13. She had neupogen day after each chemo with additional IVF if not taking po's extremely well. She will see MD again on day 15 cycle 2 on 05-08-13 . She will be away on vacation week of 05-14-13 To [facility] and she would like more than anything to get there.   History of atrial fibrillation- this was an isolated event in 2010 without recurrence on current regimen. On plavix and ASA. Rhythmol. At this time she is in NSR and with her platelets dipping low due to Chemo I asked her to stop the Plavix and OK for the ASA  Hypertension- her antihypertensives are on hold and we  can do Hydralazine BID prn for SBP >155 if needed. Maybe restat the Zebeta if SBP > 140. Current BPs look great.   Hypothyroidism- continue Synthroid  Constipation - bowel prep.   Disposition- discharge home today   Recent UTI - S/P Bactrim . Changed from Cipro for fear of Qtc prolongation.   Worsening Pancytopenia. Anemia - Hbg down to 9.1 and may lower more. Thrombocytopenia c platelets of 72. Not neutropenic yet but developing Leukopenia. With platelets dropping - Holding ASA and Plavix in hospital with plan per above.  Seen on am rounds and she is doing well s c/o and ready for D/c. See PN      Day of Discharge Exam BP 140/78  Pulse 70  Temp(Src) 97.8 F (36.6 C) (Oral)  Resp 16  Ht 5\' 3"  (1.6 m)  Wt 61 kg (134 lb 7.7 oz)  BMI 23.83 kg/m2  SpO2 98%  Physical Exam: See PN   Discharge Labs:  Recent Labs  05/04/13 0536 05/05/13 0445  NA 134* 134*  K 4.3 3.6  CL 100 100  CO2 32 29  GLUCOSE 92 95  BUN 11 11  CREATININE 0.64 0.63  CALCIUM 8.3* 8.1*    Recent Labs  05/04/13 0536 05/05/13 0445  AST 13 12  ALT 14 14  ALKPHOS 62 57  BILITOT 0.7 0.6  PROT 5.4* 5.4*  ALBUMIN 2.9* 2.9*    Recent Labs  05/03/13 1550 05/04/13 0536 05/05/13 0445  WBC 14.4* 8.1 4.9  NEUTROABS 13.7*  --  3.2  HGB 10.4* 9.5* 9.1*  HCT 30.3* 27.7* 27.1*  MCV 93.8 94.2 93.4  PLT 104* 87* 72*   No results found for this basename: CKTOTAL, CKMB, CKMBINDEX, TROPONINI,  in the last 72 hours No results found for this basename: TSH, T4TOTAL, FREET3, T3FREE, THYROIDAB,  in the last 72 hours No results found for this basename: VITAMINB12, FOLATE, FERRITIN, TIBC, IRON, RETICCTPCT,  in the last 72 hours Lab Results  Component Value Date   INR 1.19 05/03/2013   INR 1.02 02/21/2012   INR 1.4 05/16/2009       Discharge instructions:  Future Appointments Provider Department Dept Phone   05/08/2013 10:30 AM [facility] [phone]    05/08/2013 11:00 AM [facility] [address] [place] [facility] [phone]   05/09/2013 2:30 PM [facility] [phone]   05/09/2013 3:00 PM Reece Packer, MD [place] [facility] [phone]   05/09/2013 4:00 PM [facility] [address] [place] [facility] [phone]   05/10/2013 1:00 PM [facility] [place] [facility] [phone]     01-Home or Self Care Follow-up Information   Follow up with Gwen Pounds, MD In 3 weeks.   Contact information:   [address] [facility] [place] [address] [phone]       Follow up with LIVESAY,LENNIS P, MD. Call in 4 days.   Contact information:   [address] [place] [address] [phone]        Disposition: Home  Follow-up Appts: Follow-up with Dr. Timothy Lasso at [facility] in 3 weeks.  Call for appointment.  Condition on Discharge: stable  Tests Needing Follow-up: Labs  Time spent in discharge (includes decision making & examination of pt): 25 min  Signed: Tymier Lindholm M 05/05/2013, 10:04 AM

## 2013-05-05 NOTE — Progress Notes (Signed)
Subjective: F/up HC Aq PNA vrs Sxatic reaction to the Neulasta c fever up to 102.5.  Tm @ 100.1 24 hrs ago.  No further Chills, Rigors, Ab Discomfort, fatigue, anorexia, or weakness.   Looks great.  Min Diarrhea and held laxatives.   Objective: Vital signs in last 24 hours: Temp:  [97.6 F (36.4 C)-98.4 F (36.9 C)] 97.8 F (36.6 C) (06/07 0610) Pulse Rate:  [68-71] 70 (06/07 0610) Resp:  [16-18] 16 (06/07 0610) BP: (105-140)/(65-78) 140/78 mmHg (06/07 0610) SpO2:  [98 %-100 %] 98 % (06/07 0610) Weight change:  Last BM Date: 05/04/13  CBG (last 3)  No results found for this basename: GLUCAP,  in the last 72 hours  Intake/Output from previous day:  Intake/Output Summary (Last 24 hours) at 05/05/13 0855 Last data filed at 05/05/13 4098  Gross per 24 hour  Intake 1191.42 ml  Output   2800 ml  Net -1608.58 ml   06/06 0701 - 06/07 0700 In: 1780.2 [P.O.:360; I.V.:1070.2; IV Piggyback:350] Out: 2250 [Urine:2250]   Physical Exam General appearance: A and O.  Looks better than expected. Eyes: no scleral icterus Throat: oropharynx moist without erythema Resp: CTA B Cardio: Reg GI: soft, non-tender; bowel sounds normal; no masses,  no organomegaly.  Less Distended.  No pain Extremities: no clubbing, cyanosis or edema   Lab Results:  Recent Labs  05/04/13 0536 05/05/13 0445  NA 134* 134*  K 4.3 3.6  CL 100 100  CO2 32 29  GLUCOSE 92 95  BUN 11 11  CREATININE 0.64 0.63  CALCIUM 8.3* 8.1*     Recent Labs  05/04/13 0536 05/05/13 0445  AST 13 12  ALT 14 14  ALKPHOS 62 57  BILITOT 0.7 0.6  PROT 5.4* 5.4*  ALBUMIN 2.9* 2.9*     Recent Labs  05/03/13 1550 05/04/13 0536 05/05/13 0445  WBC 14.4* 8.1 4.9  NEUTROABS 13.7*  --  3.2  HGB 10.4* 9.5* 9.1*  HCT 30.3* 27.7* 27.1*  MCV 93.8 94.2 93.4  PLT 104* 87* 72*    Lab Results  Component Value Date   INR 1.19 05/03/2013   INR 1.02 02/21/2012   INR 1.4 05/16/2009    No results found for this basename:  CKTOTAL, CKMB, CKMBINDEX, TROPONINI,  in the last 72 hours  No results found for this basename: TSH, T4TOTAL, FREET3, T3FREE, THYROIDAB,  in the last 72 hours  No results found for this basename: VITAMINB12, FOLATE, FERRITIN, TIBC, IRON, RETICCTPCT,  in the last 72 hours  Micro Results: Recent Results (from the past 240 hour(s))  CULTURE, BLOOD (ROUTINE X 2)     Status: None   Collection Time    05/03/13  3:50 PM      Result Value Range Status   Specimen Description BLOOD RIGHT ARM   Final   Special Requests BOTTLES DRAWN AEROBIC ONLY 5CC   Final   Culture  Setup Time 05/03/2013 22:57   Final   Culture     Final   Value:        BLOOD CULTURE RECEIVED NO GROWTH TO DATE CULTURE WILL BE HELD FOR 5 DAYS BEFORE ISSUING A FINAL NEGATIVE REPORT   Report Status PENDING   Incomplete  CULTURE, BLOOD (ROUTINE X 2)     Status: None   Collection Time    05/03/13  4:03 PM      Result Value Range Status   Specimen Description BLOOD RIGHT ARM   Final   Special Requests BOTTLES  DRAWN AEROBIC AND ANAEROBIC 5CC   Final   Culture  Setup Time 05/03/2013 22:57   Final   Culture     Final   Value:        BLOOD CULTURE RECEIVED NO GROWTH TO DATE CULTURE WILL BE HELD FOR 5 DAYS BEFORE ISSUING A FINAL NEGATIVE REPORT   Report Status PENDING   Incomplete     Studies/Results: Dg Chest 2 View  05/05/2013   *RADIOLOGY REPORT*  Clinical Data: Pneumonia follow up  CHEST - 2 VIEW  Comparison: Prior chest x-ray 05/03/2013  Findings: The previously described faint patchy opacity in the right lower lobe appears less conspicuous on today's exam.  Cardiac and mediastinal contours within normal limits.  Mild multilevel degenerative change throughout the thoracic spine.  Central bronchitic changes are stable.  Mild dextroconvex scoliosis of the upper lumbar spine.  No acute osseous abnormality.  IMPRESSION:  The previously described faint focus of patchy airspace disease in the right lower lobe is less conspicuous on  today's exam.  This may reflect resolving infiltrate or atelectasis.   Original Report Authenticated By: Malachy Moan, M.D.   Dg Chest 2 View  05/03/2013   *RADIOLOGY REPORT*  Clinical Data: Fever.  Chemotherapy for ovarian cancer is ongoing.  CHEST - 2 VIEW  Comparison: Chest radiograph 04/19/2013.  Findings: There is faint patchy density in the right lower lobes seen on the frontal view adjacent to the right heart border and anterior to the lower thoracic spine on the lateral views suspicious for early pneumonia.  Cardiopericardial silhouette appears within normal limits.  Trachea midline.  IMPRESSION: Faint focus patchy airspace disease in the right lower lobe suspicious for early pneumonia.   Original Report Authenticated By: Andreas Newport, M.D.   Abd 1 View (kub)  05/03/2013   *RADIOLOGY REPORT*  Clinical Data: Abdominal pain and distention.  Nausea.  ABDOMEN - 1 VIEW  Comparison: Abdomen pelvis CT dated 01/17/2013.  Findings: Normal bowel gas pattern.  Right pelvic surgical clips. Right total hip prosthesis.  Thoracolumbar scoliosis and degenerative changes.  IMPRESSION: No acute abnormality.   Original Report Authenticated By: Beckie Salts, M.D.     Medications: Scheduled: . enoxaparin (LOVENOX) injection  40 mg Subcutaneous Q24H  . estradiol  1 mg Oral Daily  . levothyroxine  88 mcg Oral QAC breakfast  . magnesium hydroxide  30 mL Oral Daily  . ondansetron  8 mg Oral q morning - 10a  . piperacillin-tazobactam  3.375 g Intravenous Q8H  . polyethylene glycol  17 g Oral Daily  . propafenone  225 mg Oral BID  . senna-docusate  1 tablet Oral BID  . vancomycin  500 mg Intravenous Q12H   Continuous: . 0.9 % NaCl with KCl 20 mEq / L 40 mL/hr at 05/04/13 1547     Assessment/Plan: Principal Problem:   Fever Active Problems:   Malignant neoplasm of ovary   Hypertension   Nausea   Constipation  HCAPNA? - CXR c/w Faint focus patchy airspace disease in the right lower lobe suspicious  for early pneumonia.  WBC up to 14.5 on admission and fell into 4's this am.  No cough or shortness of breath. Await Cxs.  I still have her on Zosyn and Vancomycin for empiric Abx. Appreciate Dr Henreitta Leber help.  Of course all of her Sxs could have been Neulasta driven and she may not even need the Abx or prolonged stay.  I repeated CXR this am and it looks clear to me.  Official Radiology read was better as well.  I am inclined to D/C her today, D/C Vanco and d/c on Cefdinir 300 BID for 7 day course.  She was instructed to call Dr Darrold Span on Monday and Get labs by Tuesday to F/Up.  Ab Discomfort c Distension Better- KUB (-). LFTs fine x low protein and albumin.  Bowel Prep helped.  No current NGT needed at this time.  Protein calorie malnutrition. - Nutrition consult ordered  Hyponatremia: mild and stable at 134  Recent vasovagal syncope in the setting of volume depletion, nausea related to her chemotherapy, and relative anorexia with fluid/food. ECHO shoed Nml EF.   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 Darrold Span.  The plan on 04/30/13 was: She is s/post optimal debulking including resection of portion of rectum with reanastomosis 02-13-13: adjuvant dose dense carbo taxol begun 03-20-13. Day 1 cycle 2 delayed one week due to neutropenia, day 8 cycle 2 delayed due to UTI and hypotension/syncope but was given on 05-01-13. She had neupogen day after each chemo with additional IVF if not taking po's extremely well. She will see MD again on day 15 cycle 2 on 05-08-13 . She will be away on vacation week of 05-14-13  To [facility] and she would like more than anything to get there.  History of atrial fibrillation- this was an isolated event in 2010 without recurrence on current regimen. On pradaxa and ASA. Rhythmol.  Hypertension- her antihypertensives are on hold and we can do Hydralazine BID prn for SBP >155 if needed. Current BPs look great.    Hypothyroidism- continue Synthroid  Constipation - bowel prep.  Disposition- discharge home when better.  Maybe tomorrow or Sunday.   Recent UTI - S/P Bactrim . Changed from Cipro for fear of Qtc prolongation.  Worsening Pancytopenia.  Anemia - Hbg down to 9.1 and may lower more.  Thrombocytopenia c platelets of 72. Not neutropenic yet but developing Leukopenia. With platelets dropping - Holding ASA and Plavix   ID -  Anti-infectives   Start     Dose/Rate Route Frequency Ordered Stop   05/04/13 0600  vancomycin (VANCOCIN) 500 mg in sodium chloride 0.9 % 100 mL IVPB     500 mg 100 mL/hr over 60 Minutes Intravenous Every 12 hours 05/03/13 2048     05/04/13 0000  piperacillin-tazobactam (ZOSYN) IVPB 3.375 g     3.375 g 12.5 mL/hr over 4 Hours Intravenous Every 8 hours 05/03/13 1948     06 /05/14 1700  vancomycin (VANCOCIN) IVPB 1000 mg/200 mL premix     1,000 mg 200 mL/hr over 60 Minutes Intravenous  Once 05/03/13 1629 05/03/13 1923   05/03/13 1700  piperacillin-tazobactam (ZOSYN) IVPB 3.375 g     3.375 g 100 mL/hr over 30 Minutes Intravenous  Once 05/03/13 1629 05/03/13 1823     DVT Prophylaxis - lovenox    LOS: 2 days   Jaeli Grubb M 05/05/2013, 8:55 AM

## 2013-05-05 NOTE — Progress Notes (Signed)
Patient discharged per MD order. Discharge instructions reviewed with patient. Two paper prescriptions given to patient and patient supplied meds picked up from pharmacy and returned to patient. Patient wheeled to the car by this RN for transport home. Angelena Form, RN

## 2013-05-07 ENCOUNTER — Telehealth: Payer: Self-pay | Admitting: Oncology

## 2013-05-07 ENCOUNTER — Other Ambulatory Visit (HOSPITAL_BASED_OUTPATIENT_CLINIC_OR_DEPARTMENT_OTHER): Payer: Medicare Other | Admitting: Lab

## 2013-05-07 ENCOUNTER — Other Ambulatory Visit: Payer: Self-pay | Admitting: Oncology

## 2013-05-07 ENCOUNTER — Other Ambulatory Visit: Payer: Self-pay

## 2013-05-07 ENCOUNTER — Telehealth: Payer: Self-pay | Admitting: *Deleted

## 2013-05-07 ENCOUNTER — Telehealth: Payer: Self-pay

## 2013-05-07 DIAGNOSIS — C569 Malignant neoplasm of unspecified ovary: Secondary | ICD-10-CM

## 2013-05-07 DIAGNOSIS — C561 Malignant neoplasm of right ovary: Secondary | ICD-10-CM

## 2013-05-07 LAB — CBC WITH DIFFERENTIAL/PLATELET
BASO%: 0.9 % (ref 0.0–2.0)
LYMPH%: 45.8 % (ref 14.0–49.7)
MCHC: 35 g/dL (ref 31.5–36.0)
MONO#: 0.1 10*3/uL (ref 0.1–0.9)
Platelets: 89 10*3/uL — ABNORMAL LOW (ref 145–400)
RBC: 3.1 10*6/uL — ABNORMAL LOW (ref 3.70–5.45)
RDW: 15.7 % — ABNORMAL HIGH (ref 11.2–14.5)
WBC: 3.4 10*3/uL — ABNORMAL LOW (ref 3.9–10.3)

## 2013-05-07 LAB — COMPREHENSIVE METABOLIC PANEL (CC13)
ALT: 19 U/L (ref 0–55)
AST: 15 U/L (ref 5–34)
Calcium: 9.3 mg/dL (ref 8.4–10.4)
Chloride: 103 mEq/L (ref 98–107)
Creatinine: 0.7 mg/dL (ref 0.6–1.1)
Sodium: 139 mEq/L (ref 136–145)
Total Bilirubin: 0.64 mg/dL (ref 0.20–1.20)

## 2013-05-07 NOTE — Telephone Encounter (Signed)
Ms. Gaffey platelets are 19 K today.  Reviewed counts with Dr. Darrold Span.  Will cancel chemotherapy tomorrow and visit 05-09-13.  Patient going to the beach next week.  Her cell [phone].   Will reschedule lab and visit for 05-21-13.  Visit at 1100 per Dr. Darrold Span.  Chemotherapy on 05-22-13 short taxol and carbo.  Neupogen injection on 05-23-13.  POF to scheduling.

## 2013-05-07 NOTE — Telephone Encounter (Signed)
Per staff message and POF I have scheduled appts.  JMW  

## 2013-05-07 NOTE — Telephone Encounter (Signed)
[name] is feeling much better since discharge from the hospital.  She is eating and drinking fluids well as of yesterday.  She feelsup to a treatment tomorrow if her counts are high enough.   She will come for a CBC today at 1145 am.

## 2013-05-07 NOTE — Telephone Encounter (Signed)
s.w. pt and advised on June 23, 24 and 25th appt...pt ok and awrae

## 2013-05-08 ENCOUNTER — Other Ambulatory Visit: Payer: Medicare Other | Admitting: Lab

## 2013-05-08 ENCOUNTER — Ambulatory Visit: Payer: Medicare Other

## 2013-05-08 LAB — CA 125: CA 125: 16.8 U/mL (ref 0.0–30.2)

## 2013-05-09 ENCOUNTER — Ambulatory Visit: Payer: Medicare Other

## 2013-05-09 ENCOUNTER — Other Ambulatory Visit: Payer: Medicare Other | Admitting: Lab

## 2013-05-09 ENCOUNTER — Ambulatory Visit: Payer: Medicare Other | Admitting: Oncology

## 2013-05-09 LAB — CULTURE, BLOOD (ROUTINE X 2): Culture: NO GROWTH

## 2013-05-10 ENCOUNTER — Ambulatory Visit: Payer: Medicare Other

## 2013-05-15 ENCOUNTER — Ambulatory Visit: Payer: Medicare Other | Admitting: Oncology

## 2013-05-19 ENCOUNTER — Other Ambulatory Visit: Payer: Self-pay | Admitting: Oncology

## 2013-05-21 ENCOUNTER — Telehealth: Payer: Self-pay | Admitting: Oncology

## 2013-05-21 ENCOUNTER — Ambulatory Visit (HOSPITAL_BASED_OUTPATIENT_CLINIC_OR_DEPARTMENT_OTHER): Payer: Medicare Other | Admitting: Oncology

## 2013-05-21 ENCOUNTER — Other Ambulatory Visit (HOSPITAL_BASED_OUTPATIENT_CLINIC_OR_DEPARTMENT_OTHER): Payer: Medicare Other | Admitting: Lab

## 2013-05-21 ENCOUNTER — Encounter: Payer: Self-pay | Admitting: Oncology

## 2013-05-21 ENCOUNTER — Telehealth: Payer: Self-pay | Admitting: *Deleted

## 2013-05-21 VITALS — BP 141/65 | HR 67 | Temp 96.9°F | Resp 18 | Ht 63.0 in | Wt 136.9 lb

## 2013-05-21 DIAGNOSIS — C569 Malignant neoplasm of unspecified ovary: Secondary | ICD-10-CM

## 2013-05-21 LAB — CBC WITH DIFFERENTIAL/PLATELET
BASO%: 0.9 % (ref 0.0–2.0)
EOS%: 2.1 % (ref 0.0–7.0)
HCT: 31.8 % — ABNORMAL LOW (ref 34.8–46.6)
LYMPH%: 41.1 % (ref 14.0–49.7)
MCH: 33.3 pg (ref 25.1–34.0)
MCHC: 35 g/dL (ref 31.5–36.0)
NEUT%: 44.9 % (ref 38.4–76.8)
Platelets: 291 10*3/uL (ref 145–400)

## 2013-05-21 LAB — COMPREHENSIVE METABOLIC PANEL (CC13)
Albumin: 3.5 g/dL (ref 3.5–5.0)
Alkaline Phosphatase: 55 U/L (ref 40–150)
BUN: 8.8 mg/dL (ref 7.0–26.0)
CO2: 28 mEq/L (ref 22–29)
Calcium: 9.5 mg/dL (ref 8.4–10.4)
Glucose: 74 mg/dl (ref 70–99)
Potassium: 4 mEq/L (ref 3.5–5.1)

## 2013-05-21 NOTE — Telephone Encounter (Signed)
Per staff phone call and POF I have schedueld appts.  JMW  

## 2013-05-21 NOTE — Progress Notes (Signed)
OFFICE PROGRESS NOTE   05/21/2013   Physicians:D. ClarkePearson; J.Russo, T.Fontaine, [name], [name]. Last gastroenterologist in [place].   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 She has had a difficult time with chemotherapy since this was begun 03-20-13, including hospitalizations after cycle 2 day 1 and again after cycle 2 day 8, and also needed gCSF added by end of cycle 1. The first hospitalization 5-22 to 04-20-13 was for syncope in setting of inadequate po intake and antihypertensives, and the second hospitalization from 6-5 to 05-05-13 with fever, cultures negative and question of pneumonia by CXR only. Last chemo was day 8 cycle 2 on 05-01-13, day 15 held with other complications and then her beach vacation. She does not have PAC.   Oncologic History  She had ~ 2 weeks vaginal spotting in late Dec 2013, then presented to [name] in late Jan 2014 after bright red spotting that AM, with uterus larger than had been apparent on exam in Oct. Sonohystogram 01-05-13 showed uterus normal size and echotexture, endometrium 4.3 mm, left ovary normal and right adnexa with 1.1 x 8.4.x8.2 cm cystic and solid mass. Endometrial biopsy benign and CA 125 also on 01-05-13 was 178.8. She had CT AP 01-17-13 with 1.0 x 6.9 x 8.9 cm complex right ovarian mass, no ascites, small retroperitoneal nodes. She was seen by [name] on 01-18-13 and taken to surgery by [name] on 02-13-13, which was TAH/BSO/ omentectomy/ureterolysis/ resection of cul-de-sac tumor/right pelvic lymphadnectomy and resection of rectum with reanastomosis. At completion of surgery there was no gross residual disease. 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 [place] [phone]) had rare clusters of atypical cells. Chemotherapy with dose dense taxol carboplatin was begun day 1 cycle 1 on 03-20-13; ANC was 1.1 on day 15 cycle 1 with taxol given and neupogen added 04-04-13. She had day 1 cycle 2 treatment on 04-17-13, then was briefly hospitalized 5-22 to 04-20-13 after syncopal episode, with antihypertensive agents DCd and UTI treated. She was feeling much better at time of "day 8" cycle 2 on 05-01-13 and did have neupogen 300 mcg x 1 dose on 05-02-13. She was readmitted to hospital 6-5 thru 05-05-13 with fever, empirically on antibiotics and blood cultures negative.   Patient is feeling much better overall now, very much enjoyed her trip to beach with family. Appetite and energy are good, no fever, no bladder symptoms. She is monitoring blood pressure and using hydralazine as prn, needing this 3-4x in past week; she sees Dr Timothy Lasso in follow up later this week.She had some constipation during the vacation, took miralax and then had diarrhea. Bowels are ok now.  Remainder of 10 point Review of Systems negative.  Objective:  Vital signs in last 24 hours:  BP 141/65  Pulse 67  Temp(Src) 96.9 F (36.1 C) (Oral)  Resp 18  Ht 5\' 3"  (1.6 m)  Wt 136 lb 14.4 oz (62.097 kg)  BMI 24.26 kg/m2  Weight is up 2 lbs. Easily ambulatory, looks comfortable.  HEENT:PERRLA, sclera clear, anicteric and oropharynx clear, no lesions  wearing wig LymphaticsCervical, supraclavicular, and axillary nodes normal. No inguinal adenopathy Resp: clear to auscultation bilaterally and normal percussion bilaterally Cardio: regular rate and rhythm GI: soft, nontender, not distended, normal bowel sounds, surgical incision not  remarkable, no HSM or mass Extremities: extremities normal, atraumatic, no cyanosis or edema Neuro:no sensory deficits noted Skin without rash or ecchymosis  Lab Results:  Results for orders placed in visit on 05/21/13  CBC WITH DIFFERENTIAL      Result Value Range   WBC 2.9 (*) 3.9 - 10.3 10e3/uL   NEUT# 1.3 (*) 1.5 - 6.5 10e3/uL   HGB 11.1 (*) 11.6 - 15.9 g/dL   HCT 40.9 (*) 81.1 - 91.4 %   Platelets 291  145 - 400 10e3/uL   MCV 95.2  79.5 - 101.0 fL   MCH 33.3  25.1 - 34.0 pg   MCHC 35.0  31.5 - 36.0 g/dL   RBC 7.82 (*) 9.56 - 2.13 10e6/uL   RDW 18.3 (*) 11.2 - 14.5 %   lymph# 1.2  0.9 - 3.3 10e3/uL   MONO# 0.3  0.1 - 0.9 10e3/uL   Eosinophils Absolute 0.1  0.0 - 0.5 10e3/uL   Basophils Absolute 0.0  0.0 - 0.1 10e3/uL   NEUT% 44.9  38.4 - 76.8 %   LYMPH% 41.1  14.0 - 49.7 %   MONO% 11.0  0.0 - 14.0 %   EOS% 2.1  0.0 - 7.0 %   BASO% 0.9  0.0 - 2.0 %  COMPREHENSIVE METABOLIC PANEL (CC13)      Result Value Range   Sodium 139  136 - 145 mEq/L   Potassium 4.0  3.5 - 5.1 mEq/L   Chloride 103  98 - 107 mEq/L   CO2 28  22 - 29 mEq/L   Glucose 74  70 - 99 mg/dl   BUN 8.8  7.0 - 08.6 mg/dL   Creatinine 0.7  0.6 - 1.1 mg/dL   Total Bilirubin 5.78  0.20 - 1.20 mg/dL   Alkaline Phosphatase 55  40 - 150 U/L   AST 17  5 - 34 U/L   ALT 12  0 - 55 U/L   Total Protein 6.2 (*) 6.4 - 8.3 g/dL   Albumin 3.5  3.5 - 5.0 g/dL   Calcium 9.5  8.4 - 46.9 mg/dL   CA 629 available after visit 17.5   (CA 125 at presentation 178)  Studies/Results:  CXR and KUB 6-5 and 05-05-13 noted  Medications: I have reviewed the patient's current medications.  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 Cycle 2 delayed one week with neutropenia, then short hospitalizations after  cycle 2 day 1 and again after day 8. Day 15 omitted due to beach vacation. She will begin cycle 3 day 1 on 05-22-13 with neupogen 6-25 and 6-26 as well as additional IVF at least one day. 2.HTN, elevated lipids, one episode of paroxysmal Afib. On pradaxa and ASA. HCTZ and losartan held since 04-09-13 and hydralazine now prn. Dr Timothy Lasso following also.  3.hypothyroidism on replacement  4.osteoarthritis, post right hip replacement 2013 5. Previous enterobacter UTI  Patient understands and is in agreement with plan above.  [name] P, MD   05/21/2013, 1:07 PM

## 2013-05-21 NOTE — Patient Instructions (Signed)
OK to continue zofran regularly for now. If there are days when you really have no nausea, do not take it those days.

## 2013-05-22 ENCOUNTER — Other Ambulatory Visit: Payer: Self-pay | Admitting: Oncology

## 2013-05-22 ENCOUNTER — Ambulatory Visit (HOSPITAL_BASED_OUTPATIENT_CLINIC_OR_DEPARTMENT_OTHER): Payer: Medicare Other

## 2013-05-22 VITALS — BP 122/71 | HR 72 | Temp 97.0°F

## 2013-05-22 DIAGNOSIS — C569 Malignant neoplasm of unspecified ovary: Secondary | ICD-10-CM

## 2013-05-22 DIAGNOSIS — Z5111 Encounter for antineoplastic chemotherapy: Secondary | ICD-10-CM

## 2013-05-22 MED ORDER — SODIUM CHLORIDE 0.9 % IV SOLN
Freq: Once | INTRAVENOUS | Status: AC
  Administered 2013-05-22: 15:00:00 via INTRAVENOUS

## 2013-05-22 MED ORDER — SODIUM CHLORIDE 0.9 % IV SOLN
500.0000 mg | Freq: Once | INTRAVENOUS | Status: AC
  Administered 2013-05-22: 500 mg via INTRAVENOUS
  Filled 2013-05-22: qty 50

## 2013-05-22 MED ORDER — ONDANSETRON 16 MG/50ML IVPB (CHCC)
16.0000 mg | Freq: Once | INTRAVENOUS | Status: AC
  Administered 2013-05-22: 16 mg via INTRAVENOUS

## 2013-05-22 MED ORDER — DIPHENHYDRAMINE HCL 50 MG/ML IJ SOLN
50.0000 mg | Freq: Once | INTRAMUSCULAR | Status: AC
  Administered 2013-05-22: 50 mg via INTRAVENOUS

## 2013-05-22 MED ORDER — FAMOTIDINE IN NACL 20-0.9 MG/50ML-% IV SOLN
20.0000 mg | Freq: Once | INTRAVENOUS | Status: AC
  Administered 2013-05-22: 20 mg via INTRAVENOUS

## 2013-05-22 MED ORDER — SODIUM CHLORIDE 0.9 % IV SOLN
80.0000 mg/m2 | Freq: Once | INTRAVENOUS | Status: AC
  Administered 2013-05-22: 132 mg via INTRAVENOUS
  Filled 2013-05-22: qty 22

## 2013-05-22 MED ORDER — DEXAMETHASONE SODIUM PHOSPHATE 20 MG/5ML IJ SOLN
20.0000 mg | Freq: Once | INTRAMUSCULAR | Status: AC
  Administered 2013-05-22: 20 mg via INTRAVENOUS

## 2013-05-22 NOTE — Progress Notes (Signed)
Ok to treat per Dr. Livesay 

## 2013-05-22 NOTE — Patient Instructions (Addendum)
[facility] Discharge Instructions for Patients Receiving Chemotherapy  Today you received the following chemotherapy agents Taxol/Carboplatin To help prevent nausea and vomiting after your treatment, we encourage you to take your nausea medication as prescribed.  If you develop nausea and vomiting that is not controlled by your nausea medication, call the clinic.   BELOW ARE SYMPTOMS THAT SHOULD BE REPORTED IMMEDIATELY:  *FEVER GREATER THAN 100.5 F  *CHILLS WITH OR WITHOUT FEVER  NAUSEA AND VOMITING THAT IS NOT CONTROLLED WITH YOUR NAUSEA MEDICATION  *UNUSUAL SHORTNESS OF BREATH  *UNUSUAL BRUISING OR BLEEDING  TENDERNESS IN MOUTH AND THROAT WITH OR WITHOUT PRESENCE OF ULCERS  *URINARY PROBLEMS  *BOWEL PROBLEMS  UNUSUAL RASH Items with * indicate a potential emergency and should be followed up as soon as possible.  Feel free to call the clinic you have any questions or concerns. The clinic phone number is [phone].    

## 2013-05-23 ENCOUNTER — Ambulatory Visit (HOSPITAL_BASED_OUTPATIENT_CLINIC_OR_DEPARTMENT_OTHER): Payer: Medicare Other

## 2013-05-23 ENCOUNTER — Other Ambulatory Visit: Payer: Self-pay | Admitting: Oncology

## 2013-05-23 ENCOUNTER — Ambulatory Visit: Payer: Medicare Other

## 2013-05-23 VITALS — BP 121/68 | HR 63 | Temp 98.0°F

## 2013-05-23 DIAGNOSIS — E860 Dehydration: Secondary | ICD-10-CM

## 2013-05-23 DIAGNOSIS — Z5189 Encounter for other specified aftercare: Secondary | ICD-10-CM

## 2013-05-23 DIAGNOSIS — C569 Malignant neoplasm of unspecified ovary: Secondary | ICD-10-CM

## 2013-05-23 DIAGNOSIS — C561 Malignant neoplasm of right ovary: Secondary | ICD-10-CM

## 2013-05-23 MED ORDER — FILGRASTIM 300 MCG/0.5ML IJ SOLN
300.0000 ug | Freq: Once | INTRAMUSCULAR | Status: AC
  Administered 2013-05-23: 300 ug via SUBCUTANEOUS
  Filled 2013-05-23: qty 0.5

## 2013-05-23 MED ORDER — SODIUM CHLORIDE 0.9 % IV SOLN
Freq: Once | INTRAVENOUS | Status: AC
  Administered 2013-05-23: 13:00:00 via INTRAVENOUS

## 2013-05-23 NOTE — Patient Instructions (Signed)
Dehydration, Adult Dehydration is when you lose more fluids from the body than you take in. Vital organs like the kidneys, brain, and heart cannot function without a proper amount of fluids and salt. Any loss of fluids from the body can cause dehydration.  CAUSES   Vomiting.  Diarrhea.  Excessive sweating.  Excessive urine output.  Fever. SYMPTOMS  Mild dehydration  Thirst.  Dry lips.  Slightly dry mouth. Moderate dehydration  Very dry mouth.  Sunken eyes.  Skin does not bounce back quickly when lightly pinched and released.  Dark urine and decreased urine production.  Decreased tear production.  Headache. Severe dehydration  Very dry mouth.  Extreme thirst.  Rapid, weak pulse (more than 100 beats per minute at rest).  Cold hands and feet.  Not able to sweat in spite of heat and temperature.  Rapid breathing.  Blue lips.  Confusion and lethargy.  Difficulty being awakened.  Minimal urine production.  No tears. DIAGNOSIS  Your caregiver will diagnose dehydration based on your symptoms and your exam. Blood and urine tests will help confirm the diagnosis. The diagnostic evaluation should also identify the cause of dehydration. TREATMENT  Treatment of mild or moderate dehydration can often be done at home by increasing the amount of fluids that you drink. It is best to drink small amounts of fluid more often. Drinking too much at one time can make vomiting worse. Refer to the home care instructions below. Severe dehydration needs to be treated at the hospital where you will probably be given intravenous (IV) fluids that contain water and electrolytes. HOME CARE INSTRUCTIONS   Ask your caregiver about specific rehydration instructions.  Drink enough fluids to keep your urine clear or pale yellow.  Drink small amounts frequently if you have nausea and vomiting.  Eat as you normally do.  Avoid:  Foods or drinks high in sugar.  Carbonated  drinks.  Juice.  Extremely hot or cold fluids.  Drinks with caffeine.  Fatty, greasy foods.  Alcohol.  Tobacco.  Overeating.  Gelatin desserts.  Wash your hands well to avoid spreading bacteria and viruses.  Only take over-the-counter or prescription medicines for pain, discomfort, or fever as directed by your caregiver.  Ask your caregiver if you should continue all prescribed and over-the-counter medicines.  Keep all follow-up appointments with your caregiver. SEEK MEDICAL CARE IF:  You have abdominal pain and it increases or stays in one area (localizes).  You have a rash, stiff neck, or severe headache.  You are irritable, sleepy, or difficult to awaken.  You are weak, dizzy, or extremely thirsty. SEEK IMMEDIATE MEDICAL CARE IF:   You are unable to keep fluids down or you get worse despite treatment.  You have frequent episodes of vomiting or diarrhea.  You have blood or green matter (bile) in your vomit.  You have blood in your stool or your stool looks black and tarry.  You have not urinated in 6 to 8 hours, or you have only urinated a small amount of very dark urine.  You have a fever.  You faint. MAKE SURE YOU:   Understand these instructions.  Will watch your condition.  Will get help right away if you are not doing well or get worse. Document Released: 11/15/2005 Document Revised: 02/07/2012 Document Reviewed: 07/05/2011 [employer] Patient Information 2014 [employer].  

## 2013-05-24 ENCOUNTER — Ambulatory Visit (HOSPITAL_BASED_OUTPATIENT_CLINIC_OR_DEPARTMENT_OTHER): Payer: Medicare Other

## 2013-05-24 VITALS — BP 119/62 | HR 65 | Temp 97.7°F

## 2013-05-24 DIAGNOSIS — C569 Malignant neoplasm of unspecified ovary: Secondary | ICD-10-CM

## 2013-05-24 DIAGNOSIS — Z5189 Encounter for other specified aftercare: Secondary | ICD-10-CM

## 2013-05-24 DIAGNOSIS — C561 Malignant neoplasm of right ovary: Secondary | ICD-10-CM

## 2013-05-24 MED ORDER — FILGRASTIM 300 MCG/0.5ML IJ SOLN
300.0000 ug | Freq: Once | INTRAMUSCULAR | Status: AC
  Administered 2013-05-24: 300 ug via SUBCUTANEOUS
  Filled 2013-05-24: qty 0.5

## 2013-05-24 NOTE — Patient Instructions (Addendum)
Filgrastim, G-CSF injection What is this medicine? FILGRASTIM, G-CSF (fil GRA stim) stimulates the formation of white blood cells. This medicine is given to patients with conditions that may cause a decrease in white blood cells, like those receiving certain types of chemotherapy or bone marrow transplant. It helps the bone marrow recover its ability to produce white blood cells. Increasing the amount of white blood cells helps to decrease the risk of infection and fever. This medicine may be used for other purposes; ask your health care provider or pharmacist if you have questions. What should I tell my health care provider before I take this medicine? They need to know if you have any of these conditions: -currently receiving radiation therapy -sickle cell disease -an unusual or allergic reaction to filgrastim, E. coli protein, other medicines, foods, dyes, or preservatives -pregnant or trying to get pregnant -breast-feeding How should I use this medicine? This medicine is for injection into a vein or injection under the skin. It is usually given by a health care professional in a hospital or clinic setting. If you get this medicine at home, you will be taught how to prepare and give this medicine. Always change the site for the injection under the skin. Let the solution warm to room temperature before you use it. Do not shake the solution before you withdraw a dose. Throw away any unused portion. Use exactly as directed. Take your medicine at regular intervals. Do not take your medicine more often than directed. It is important that you put your used needles and syringes in a special sharps container. Do not put them in a trash can. If you do not have a sharps container, call your pharmacist or healthcare provider to get one. Talk to your pediatrician regarding the use of this medicine in children. While this medicine may be prescribed for children for selected conditions, precautions do  apply. Overdosage: If you think you have taken too much of this medicine contact a poison control center or emergency room at once. NOTE: This medicine is only for you. Do not share this medicine with others. What if I miss a dose? Try not to miss doses. If you miss a dose take the dose as soon as you remember. If it is almost time for the next dose, do not take double doses unless told to by your doctor or health care professional. What may interact with this medicine? -lithium -medicines for cancer chemotherapy This list may not describe all possible interactions. Give your health care provider a list of all the medicines, herbs, non-prescription drugs, or dietary supplements you use. Also tell them if you smoke, drink alcohol, or use illegal drugs. Some items may interact with your medicine. What should I watch for while using this medicine? Visit your doctor or health care professional for regular checks on your progress. If you get a fever or any sign of infection while you are using this medicine, do not treat yourself. Check with your doctor or health care professional. Bone pain can usually be relieved by mild pain relievers such as acetaminophen or ibuprofen. Check with your doctor or health care professional before taking these medicines as they may hide a fever. Call your doctor or health care professional if the aches and pains are severe or do not go away. What side effects may I notice from receiving this medicine? Side effects that you should report to your doctor or health care professional as soon as possible: -allergic reactions like skin rash, itching   or hives, swelling of the face, lips, or tongue -difficulty breathing, wheezing -fever -pain, redness, or swelling at the injection site -stomach or side pain, or pain at the shoulder Side effects that usually do not require medical attention (report to your doctor or health care professional if they continue or are  bothersome): -bone pain (ribs, lower back, breast bone) -headache -skin rash This list may not describe all possible side effects. Call your doctor for medical advice about side effects. You may report side effects to FDA at 1-800-FDA-1088. Where should I keep my medicine? Keep out of the reach of children. Store in a refrigerator between 2 and 8 degrees C (36 and 46 degrees F). Do not freeze or leave in direct sunlight. If vials or syringes are left out of the refrigerator for more than 24 hours, they must be thrown away. Throw away unused vials after the expiration date on the carton. NOTE: This sheet is a summary. It may not cover all possible information. If you have questions about this medicine, talk to your doctor, pharmacist, or health care provider.  2013, [employer]. (01/31/2008 1:33:21 PM)  

## 2013-05-27 ENCOUNTER — Other Ambulatory Visit: Payer: Self-pay | Admitting: Oncology

## 2013-05-29 ENCOUNTER — Ambulatory Visit (HOSPITAL_BASED_OUTPATIENT_CLINIC_OR_DEPARTMENT_OTHER): Payer: Medicare Other

## 2013-05-29 ENCOUNTER — Encounter: Payer: Self-pay | Admitting: Oncology

## 2013-05-29 ENCOUNTER — Other Ambulatory Visit (HOSPITAL_BASED_OUTPATIENT_CLINIC_OR_DEPARTMENT_OTHER): Payer: Medicare Other | Admitting: Lab

## 2013-05-29 ENCOUNTER — Ambulatory Visit (HOSPITAL_BASED_OUTPATIENT_CLINIC_OR_DEPARTMENT_OTHER): Payer: Medicare Other | Admitting: Oncology

## 2013-05-29 VITALS — BP 140/76 | HR 70 | Temp 97.9°F | Resp 20 | Ht 63.0 in | Wt 137.1 lb

## 2013-05-29 DIAGNOSIS — M1990 Unspecified osteoarthritis, unspecified site: Secondary | ICD-10-CM

## 2013-05-29 DIAGNOSIS — C569 Malignant neoplasm of unspecified ovary: Secondary | ICD-10-CM

## 2013-05-29 DIAGNOSIS — E039 Hypothyroidism, unspecified: Secondary | ICD-10-CM

## 2013-05-29 DIAGNOSIS — C561 Malignant neoplasm of right ovary: Secondary | ICD-10-CM

## 2013-05-29 DIAGNOSIS — Z5111 Encounter for antineoplastic chemotherapy: Secondary | ICD-10-CM

## 2013-05-29 DIAGNOSIS — I10 Essential (primary) hypertension: Secondary | ICD-10-CM

## 2013-05-29 LAB — CBC WITH DIFFERENTIAL/PLATELET
Basophils Absolute: 0 10*3/uL (ref 0.0–0.1)
Eosinophils Absolute: 0 10*3/uL (ref 0.0–0.5)
HGB: 11.3 g/dL — ABNORMAL LOW (ref 11.6–15.9)
MCV: 93.7 fL (ref 79.5–101.0)
MONO#: 0.1 10*3/uL (ref 0.1–0.9)
NEUT#: 3.4 10*3/uL (ref 1.5–6.5)
RDW: 15.1 % — ABNORMAL HIGH (ref 11.2–14.5)
lymph#: 0.8 10*3/uL — ABNORMAL LOW (ref 0.9–3.3)

## 2013-05-29 MED ORDER — SODIUM CHLORIDE 0.9 % IV SOLN
Freq: Once | INTRAVENOUS | Status: AC
  Administered 2013-05-29: 12:00:00 via INTRAVENOUS

## 2013-05-29 MED ORDER — FAMOTIDINE IN NACL 20-0.9 MG/50ML-% IV SOLN
20.0000 mg | Freq: Once | INTRAVENOUS | Status: AC
Start: 2013-05-29 — End: 2013-05-29
  Administered 2013-05-29: 20 mg via INTRAVENOUS

## 2013-05-29 MED ORDER — DIPHENHYDRAMINE HCL 50 MG/ML IJ SOLN
50.0000 mg | Freq: Once | INTRAMUSCULAR | Status: AC
  Administered 2013-05-29: 50 mg via INTRAVENOUS

## 2013-05-29 MED ORDER — SODIUM CHLORIDE 0.9 % IV SOLN
80.0000 mg/m2 | Freq: Once | INTRAVENOUS | Status: AC
  Administered 2013-05-29: 132 mg via INTRAVENOUS
  Filled 2013-05-29: qty 22

## 2013-05-29 MED ORDER — ONDANSETRON 8 MG/50ML IVPB (CHCC)
8.0000 mg | Freq: Once | INTRAVENOUS | Status: AC
  Administered 2013-05-29: 8 mg via INTRAVENOUS

## 2013-05-29 MED ORDER — DEXAMETHASONE SODIUM PHOSPHATE 20 MG/5ML IJ SOLN
20.0000 mg | Freq: Once | INTRAMUSCULAR | Status: AC
  Administered 2013-05-29: 20 mg via INTRAVENOUS

## 2013-05-29 NOTE — Patient Instructions (Addendum)
       [facility] Discharge Instructions for Patients Receiving Chemotherapy  Today you received the following chemotherapy agents: Taxol  To help prevent nausea and vomiting after your treatment, we encourage you to take your nausea medication as directed by your MD.  If you develop nausea and vomiting that is not controlled by your nausea medication, call the clinic. If it is after clinic hours your family physician or the after hours number for the clinic or go to the Emergency Department.   BELOW ARE SYMPTOMS THAT SHOULD BE REPORTED IMMEDIATELY:  *FEVER GREATER THAN 100.5 F  *CHILLS WITH OR WITHOUT FEVER  NAUSEA AND VOMITING THAT IS NOT CONTROLLED WITH YOUR NAUSEA MEDICATION  *UNUSUAL SHORTNESS OF BREATH  *UNUSUAL BRUISING OR BLEEDING  TENDERNESS IN MOUTH AND THROAT WITH OR WITHOUT PRESENCE OF ULCERS  *URINARY PROBLEMS  *BOWEL PROBLEMS  UNUSUAL RASH Items with * indicate a potential emergency and should be followed up as soon as possible.  Feel free to call the clinic you have any questions or concerns. The clinic phone number is [phone].

## 2013-05-29 NOTE — Progress Notes (Signed)
OFFICE PROGRESS NOTE   05/29/2013   Physicians:D. ClarkePearson; J.Russo, T.Fontaine, [name], [name]. Last gastroenterologist in [place].   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 She has had a difficult time with chemotherapy since this was begun 03-20-13, including hospitalizations after cycle 2 day 1 and again after cycle 2 day 8; she also needed gCSF added by end of cycle 1. She was hospitalized x2 during cycle 2, then day 15 cycle 2 held with problems requiring the hospitalization then her planned beach vacation. She resumed treatment with day 1 cycle 3 on 05-22-13, with neupogen on 6-25 and 05-24-13 and additional IVF on day 2. She was fatigued/ no energy and had some constipation since most recent treatment, but has felt a little better yesterday and today; she is due day 8 cycle 3 today and took premedication steroids last pm (decadron 20 mg 12 hrs prior). She does not have PAC, but would like to have one now as it frequently takes >1 attempt to start peripheral IV. We have been able to schedule this by IR on 06-04-13.   Oncologic History  She had ~ 2 weeks vaginal spotting in late Dec 2013, then presented to [name] in late Jan 2014 after bright red spotting that AM, with uterus larger than had been apparent on exam in Oct 2013. Sonohystogram 01-05-13 showed uterus normal size and echotexture, endometrium 4.3 mm, left ovary normal and right adnexa with 1.1 x 8.4.x8.2 cm cystic and solid mass. Endometrial biopsy benign and CA 125 also on 01-05-13 was 178.8. She had CT AP 01-17-13 with 1.0 x 6.9 x 8.9 cm complex right ovarian mass, no ascites, small retroperitoneal nodes. She was seen by [name] on 01-18-13 and  taken to surgery by [name] on 02-13-13, which was TAH/BSO/ omentectomy/ureterolysis/ resection of cul-de-sac tumor/right pelvic lymphadnectomy and resection of rectum with reanastomosis. At completion of surgery there was no gross residual disease. 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 Wash[place] [phone]) had rare clusters of atypical cells. Chemotherapy with dose dense taxol carboplatin was begun day 1 cycle 1 on 03-20-13; ANC was 1.1 on day 15 cycle 1 with taxol given and neupogen added 04-04-13. She had day 1 cycle 2 treatment on 04-17-13, then was briefly hospitalized 5-22 to 04-20-13 after syncopal episode, with antihypertensive agents DCd and UTI treated. She was feeling much better at time of "day 8" cycle 2 on 05-01-13 and did have neupogen 300 mcg x 1 dose on 05-02-13. She was readmitted to hospital 6-5 thru 05-05-13 with fever, empirically on antibiotics and blood cultures negative. She then went on planned beach vacation such that day 1 cycle 3 was administered on 05-22-13, with neupogen on 6-25 and 6-26 with IVF also on day 2.   Lack of energy is difficult for her to tolerate. Bowels did not move after last treatment until day 6, did move well again today; we have discussed laxatives. She had slight intermittent tingling in hands which is not interfering with function. She feels she is taking too many pills. No bleeding, no fever  or symptoms of infection including no bladder symptoms. She is trying to drink fluids. She used warm soaks to site of last IV, no longer tender. No syncopal episodes. She has follow up with Dr Timothy Lasso upcoming. Remainder of 10 point Review of Systems negative.  Objective:  Vital signs in last 24 hours:  BP 140/76   Pulse 70  Temp(Src) 97.9 F (36.6 C) (Oral)  Resp 20  Ht 5\' 3"  (1.6 m)  Wt 137 lb 1.6 oz (62.188 kg)  BMI 24.29 kg/m2  Weight is stable. Easily mobile, wearing wig, NAD.  HEENT:PERRLA, sclera clear, anicteric and oropharynx clear, no lesions. ALopecia. Mucous membranes moist LymphaticsCervical, supraclavicular, and axillary nodes normal. No inguinal adenopathy Resp: clear to auscultation bilaterally and normal percussion bilaterally Cardio: regular rate and rhythm GI: soft, non-tender; bowel sounds normal; no masses,  no organomegaly not distended. Surgical scar not remarkable Extremities: no edema or tenderness, no cords LE. Right forearm laterally with 3 cm nontender cord no erythema, at site of recent IV. Neuro:no sensory deficits noted Breast:normal without suspicious masses, skin or nipple changes or axillary nodes Skin no rash or ecchymosis  Lab Results:  Results for orders placed in visit on 05/29/13  CBC WITH DIFFERENTIAL      Result Value Range   WBC 4.3  3.9 - 10.3 10e3/uL   NEUT# 3.4  1.5 - 6.5 10e3/uL   HGB 11.3 (*) 11.6 - 15.9 g/dL   HCT 16.1 (*) 09.6 - 04.5 %   Platelets 241  145 - 400 10e3/uL   MCV 93.7  79.5 - 101.0 fL   MCH 32.3  25.1 - 34.0 pg   MCHC 34.5  31.5 - 36.0 g/dL   RBC 4.09 (*) 8.11 - 9.14 10e6/uL   RDW 15.1 (*) 11.2 - 14.5 %   lymph# 0.8 (*) 0.9 - 3.3 10e3/uL   MONO# 0.1  0.1 - 0.9 10e3/uL   Eosinophils Absolute 0.0  0.0 - 0.5 10e3/uL   Basophils Absolute 0.0  0.0 - 0.1 10e3/uL   NEUT% 79.1 (*) 38.4 - 76.8 %   LYMPH% 18.1  14.0 - 49.7 %   MONO% 2.6  0.0 - 14.0 %   EOS% 0.0  0.0 - 7.0 %   BASO% 0.2  0.0 - 2.0 %   nRBC 0  0 - 0 %    Last CMET 6-23 noted, normal with exception of T prot 6.3 Last CA 125 17.7 on 05-21-13, this down from preop 178  Studies/Results:  No results found.  Medications: I have reviewed the patient's current medications. She will hold glucosamine as arthritis is not as bothersome since she is on weekly decadron  for the taxol. She will hold K+ as last values good and she is off HCTZ, and chemistries will be followed for this.   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 Cycle 2 delayed one week with neutropenia, then short hospitalizations after cycle 2 day 1 and again after day 8. Day 15 cycle 2 omitted due to beach vacation. Cycle 3 day 1 given 05-22-13 with neupogen 6-25 and 6-26 as well as additional IVF; for day 8 today. She should have neupogen days 2,3,8,16 each cycle and IVF on days 2, 9, 16 each cycle, with total 6 cycles planned. Note patient is reluctant to have the extra IVF, however  agrees that we need to be proactive due to previous hospitalizations. Dr Wanita Chamberlain last note mentions follow up with him after 4 cycles; she will also need restaging CT AP and visit back to gyn oncology a few weeks after completion of chemotherapy.  2.HTN, elevated lipids, one episode of paroxysmal Afib. On pradaxa and ASA. HCTZ and losartan held since 04-09-13 and hydralazine now prn. Dr Ferd Hibbs help much appreciated.  3.hypothyroidism on replacement  4.osteoarthritis, post right hip replacement 2013  5. Previous enterobacter UTI 6. Peripheral IV access difficult for patient, PAC to be placed by IR on 06-04-13. EMLA script to pharmacy.    Reece Packer, MD   05/29/2013, 4:49 PM

## 2013-05-30 ENCOUNTER — Telehealth: Payer: Self-pay | Admitting: Oncology

## 2013-05-30 ENCOUNTER — Other Ambulatory Visit: Payer: Self-pay

## 2013-05-30 ENCOUNTER — Ambulatory Visit: Payer: Medicare Other

## 2013-05-30 ENCOUNTER — Encounter (HOSPITAL_COMMUNITY): Payer: Self-pay | Admitting: Pharmacy Technician

## 2013-05-30 ENCOUNTER — Telehealth: Payer: Self-pay | Admitting: *Deleted

## 2013-05-30 ENCOUNTER — Ambulatory Visit (HOSPITAL_BASED_OUTPATIENT_CLINIC_OR_DEPARTMENT_OTHER): Payer: Medicare Other

## 2013-05-30 VITALS — BP 136/71 | HR 66 | Temp 98.2°F

## 2013-05-30 DIAGNOSIS — C569 Malignant neoplasm of unspecified ovary: Secondary | ICD-10-CM

## 2013-05-30 DIAGNOSIS — Z5189 Encounter for other specified aftercare: Secondary | ICD-10-CM

## 2013-05-30 MED ORDER — SODIUM CHLORIDE 0.9 % IV SOLN
INTRAVENOUS | Status: DC
  Administered 2013-05-30: 16:00:00 via INTRAVENOUS

## 2013-05-30 MED ORDER — LIDOCAINE-PRILOCAINE 2.5-2.5 % EX CREA: TOPICAL_CREAM | CUTANEOUS | Status: DC | PRN

## 2013-05-30 MED ORDER — FILGRASTIM 300 MCG/0.5ML IJ SOLN
300.0000 ug | Freq: Once | INTRAMUSCULAR | Status: AC
  Administered 2013-05-30: 300 ug via SUBCUTANEOUS
  Filled 2013-05-30: qty 0.5

## 2013-05-30 MED ORDER — SODIUM CHLORIDE 0.9 % IV SOLN: INTRAVENOUS | Status: DC

## 2013-05-30 NOTE — Telephone Encounter (Signed)
Per staff phone call and POF I have schedueld appts. Scheduler aware that lab appt may need to be moved. JMW

## 2013-05-30 NOTE — Progress Notes (Signed)
Neupogen injection given in infusion room

## 2013-05-31 ENCOUNTER — Other Ambulatory Visit: Payer: Self-pay | Admitting: Radiology

## 2013-06-04 ENCOUNTER — Encounter (HOSPITAL_COMMUNITY): Payer: Self-pay

## 2013-06-04 ENCOUNTER — Other Ambulatory Visit: Payer: Self-pay | Admitting: Oncology

## 2013-06-04 ENCOUNTER — Ambulatory Visit (HOSPITAL_COMMUNITY)
Admission: RE | Admit: 2013-06-04 | Discharge: 2013-06-04 | Disposition: A | Discharge status: Home / Self Care | Payer: Medicare Other | Source: Ambulatory Visit | Attending: Oncology | Admitting: Oncology

## 2013-06-04 DIAGNOSIS — Z79899 Other long term (current) drug therapy: Secondary | ICD-10-CM | POA: Insufficient documentation

## 2013-06-04 DIAGNOSIS — Z9079 Acquired absence of other genital organ(s): Secondary | ICD-10-CM | POA: Insufficient documentation

## 2013-06-04 DIAGNOSIS — Z96649 Presence of unspecified artificial hip joint: Secondary | ICD-10-CM | POA: Insufficient documentation

## 2013-06-04 DIAGNOSIS — C569 Malignant neoplasm of unspecified ovary: Secondary | ICD-10-CM | POA: Insufficient documentation

## 2013-06-04 DIAGNOSIS — Z7982 Long term (current) use of aspirin: Secondary | ICD-10-CM | POA: Insufficient documentation

## 2013-06-04 DIAGNOSIS — C561 Malignant neoplasm of right ovary: Secondary | ICD-10-CM

## 2013-06-04 DIAGNOSIS — E785 Hyperlipidemia, unspecified: Secondary | ICD-10-CM | POA: Insufficient documentation

## 2013-06-04 DIAGNOSIS — E039 Hypothyroidism, unspecified: Secondary | ICD-10-CM | POA: Insufficient documentation

## 2013-06-04 DIAGNOSIS — I10 Essential (primary) hypertension: Secondary | ICD-10-CM | POA: Insufficient documentation

## 2013-06-04 DIAGNOSIS — Z90710 Acquired absence of both cervix and uterus: Secondary | ICD-10-CM | POA: Insufficient documentation

## 2013-06-04 LAB — BASIC METABOLIC PANEL
Chloride: 99 mEq/L (ref 96–112)
GFR calc Af Amer: >90 mL/min (ref >90)
GFR calc non Af Amer: >90 mL/min (ref >90)
Potassium: 3.5 mEq/L (ref 3.5–5.1)
Sodium: 136 mEq/L (ref 135–145)

## 2013-06-04 LAB — APTT: aPTT: 32 seconds (ref 24–37)

## 2013-06-04 LAB — CBC
HCT: 33.8 % — ABNORMAL LOW (ref 36.0–46.0)
Hemoglobin: 11.4 g/dL — ABNORMAL LOW (ref 12.0–15.0)
MCHC: 33.7 g/dL (ref 30.0–36.0)
RBC: 3.54 MIL/uL — ABNORMAL LOW (ref 3.87–5.11)
WBC: 5.7 10*3/uL (ref 4.0–10.5)

## 2013-06-04 LAB — PROTIME-INR
INR: 0.93 (ref 0.00–1.49)
Prothrombin Time: 12.3 seconds (ref 11.6–15.2)

## 2013-06-04 MED ORDER — FENTANYL CITRATE 0.05 MG/ML IJ SOLN
INTRAMUSCULAR | Status: AC
  Filled 2013-06-04: qty 4

## 2013-06-04 MED ORDER — HEPARIN SOD (PORK) LOCK FLUSH 100 UNIT/ML IV SOLN
500.0000 [IU] | Freq: Once | INTRAVENOUS | Status: AC
  Administered 2013-06-04: 500 [IU] via INTRAVENOUS

## 2013-06-04 MED ORDER — FENTANYL CITRATE 0.05 MG/ML IJ SOLN
INTRAMUSCULAR | Status: AC | PRN
  Administered 2013-06-04: 100 ug via INTRAVENOUS

## 2013-06-04 MED ORDER — LIDOCAINE HCL 1 % IJ SOLN
INTRAMUSCULAR | Status: AC
  Filled 2013-06-04: qty 20

## 2013-06-04 MED ORDER — SODIUM CHLORIDE 0.9 % IV SOLN
Freq: Once | INTRAVENOUS | Status: AC
  Administered 2013-06-04: 08:00:00 via INTRAVENOUS

## 2013-06-04 MED ORDER — CEFAZOLIN SODIUM-DEXTROSE 2-3 GM-% IV SOLR
2.0000 g | Freq: Once | INTRAVENOUS | Status: AC
  Administered 2013-06-04: 2 g via INTRAVENOUS
  Filled 2013-06-04: qty 50

## 2013-06-04 MED ORDER — MIDAZOLAM HCL 2 MG/2ML IJ SOLN
INTRAMUSCULAR | Status: AC
  Filled 2013-06-04: qty 4

## 2013-06-04 MED ORDER — MIDAZOLAM HCL 2 MG/2ML IJ SOLN
INTRAMUSCULAR | Status: AC | PRN
  Administered 2013-06-04 (×2): 1 mg via INTRAVENOUS

## 2013-06-04 NOTE — H&P (Signed)
Chief Complaint: "I'm here for a port" Referring Physician:Livesay HPI: [name] is an 72 y.o. female with hx of ovarian cancer. She is already receiving chemotherapy. She is scheduled for port placement. PMHx and meds reviewed.  Past Medical History:  Past Medical History  Diagnosis Date  . Hypertension   . Dyslipidemia   . Hypothyroidism   . Arthritis     OSTEOARTHRITIS   -- CONSTANT PAIN RIGHT HIP---AND PAIN LEFT KNEE--PT STATES SHE GETS INJECTIONS INTO HER KNEE  . Dysrhythmia     HX OF PAROXYSMAL ATRIAL FIB  . Complication of anesthesia     BLOOD PRESSURE DROPPED WITH NASAL SURGERY, ONE OF THE CARPAL TUNNEL REPAIRS AND DURING A COLONOSCOPY  . Cancer     Past Surgical History:  Past Surgical History  Procedure Laterality Date  . Bilateral carpal tunnel repair  2007  . Dilation and curettage of uterus  1969  . Surgery for ruptured ovarian cyst  1969  . Rhinoplasty for fractured nose  1986  . Left knee arthroscopy   2011  . Total hip arthroplasty  02/25/2012    Procedure: TOTAL HIP ARTHROPLASTY ANTERIOR APPROACH;  Surgeon: Kathryne Hitch, MD;  Location: [facility];  Service: Orthopedics;  Laterality: Right;  . Tonsillectomy  1962  . Laparotomy Bilateral 02/13/2013    Procedure: EXPLORATORY LAPAROTOMY TOTAL ABDOMINAL HYSTERECTOMY BILATERAL SALPINGO-OOPHORECTOMY, Partial Rectal Resection with Reanastamosis;  Surgeon: Jeannette Corpus, MD;  Location: [facility];  Service: Gynecology;  Laterality: Bilateral;  . Omentectomy  02/13/2013    Procedure: OMENTECTOMY;  Surgeon: Jeannette Corpus, MD;  Location: [facility];  Service: Gynecology;;  . Lymphadenectomy Right 02/13/2013    Procedure: PEVLIC  LYMPHADENECTOMY, DEBULKING right pelvic tumor nodules;  Surgeon: Jeannette Corpus, MD;  Location: [facility];  Service: Gynecology;  Laterality: Right;    Family History:  Family History  Problem Relation Age of Onset  . Hypertension Mother     Social History:   reports that she has never smoked. She has never used smokeless tobacco. She reports that she does not drink alcohol or use illicit drugs.  Allergies:  Allergies  Allergen Reactions  . Codeine Other (See Comments)    Does not like the feeling she gets  . Lisinopril     LIP NUMBNESS    Medications: aspirin EC 81 MG tablet (Taking) Sig - Route: Take 81 mg by mouth daily. - Oral Class: Historical Med bisacodyl (DULCOLAX) 5 MG EC tablet (Taking) 30 tablet 0 05/05/2013 Sig - Route: Take 1 tablet (5 mg total) by mouth daily as needed. - Oral Number of times this order has been changed since signing: 1 [address] bisoprolol (ZEBETA) 5 MG tablet (Taking) 05/05/2013 Sig - Route: Take 5 mg by mouth 2 (two) times daily. - Oral Class: Historical Med Number of times this order has been changed since signing: 1 Order [address] dexamethasone (DECADRON) 4 MG tablet (Taking) 30 tablet 0 04/30/2013 Sig - Route: Take 5 tablets (20 mg total) by mouth once a week. Take 12 hours prior to Taxol or as directed. - Oral Number of times this order has been changed since signing: 1 Order [address] estradiol (ESTRACE) 1 MG tablet (Taking) 90 tablet 3 09/25/2012 Sig - Route: Take 1 tablet (1 mg total) by mouth every morning. PT STATES SHE WANTS ESTRACE--NOT THE GENERIC - Oral Number of times this order has been changed since signing: 1 Order [address] hydrALAZINE (APRESOLINE) 25 MG tablet (Taking) 05/05/2013  Sig: 2 times daily only if SBP > 155 Class: No Print Number of times this order has been changed since signing: 1 Order [address] levothyroxine (SYNTHROID, LEVOTHROID) 88 MCG tablet (Taking) Sig - Route: Take 88 mcg by mouth every morning. PT STATES SHE NEEDS THE SYNTHROID--DOES NOT WANT THE GENERIC - Oral Class: Historical Med Number of times this order has been changed since signing: 1 Order [address] lidocaine-prilocaine (EMLA) cream (Taking) 30 g 2 05/30/2013 Sig - Route: Apply topically as needed. Apply 1-2 hours  prior to [facility] access as needed. - Topical Number of times this order has been changed since signing: 1 Order [address] LORazepam (ATIVAN) 1 MG tablet (Taking) Sig - Route: Take 1 mg by mouth every 8 (eight) hours as needed for anxiety. - Oral Class: Historical Med Multiple Vitamin (MULITIVITAMIN WITH MINERALS) TABS (Taking) Sig - Route: Take 1 tablet by mouth every morning. - Oral Class: Historical Med ondansetron (ZOFRAN) 8 MG tablet (Taking) 20 tablet 1 04/30/2013 Sig - Route: Take 1-2 tablets (8-16 mg total) by mouth every 12 (twelve) hours as needed for nausea. - Oral Number of times this order has been changed since signing: 1 Order [address] polyethylene glycol (MIRALAX / GLYCOLAX) packet (Taking) Sig - Route: Take 17 g by mouth daily. - Oral Class: Historical Med potassium chloride (KLOR-CON 10) 10 MEQ tablet (Taking) 05/05/2013 Sig - Route: Take 1 tablet (10 mEq total) by mouth daily. - Oral Class: No Print Number of times this order has been changed since signing: 1 Order [address] propafenone (RYTHMOL) 225 MG tablet (Taking) Sig - Route: Take 225 mg by mouth 2 (two) times daily. 9am9pm - Oral Class: Historical Med Number of times this order has been changed since signing: 2 Order [address] senna (SENOKOT) 8.6 MG tablet (Taking) Sig - Route: Take 1 tablet by mouth 2 (two) times daily as needed for constipation. - Oral Class: Historical Med acetaminophen (TYLENOL) 325 MG tablet 05/05/2013 Sig - Route: Take 2 tablets (650 mg total) by mouth every 6 (six) hours as needed  Please HPI for pertinent positives, otherwise complete 10 system ROS negative.  Physical Exam: Temp: 98.1, HR: 71, RR: 18, BP: 150/79   General Appearance:  Alert, cooperative, no distress, appears stated age  Head:  Normocephalic, without obvious abnormality, atraumatic  ENT: Unremarkable  Neck: Supple, symmetrical, trachea midline  Lungs:   Clear to auscultation bilaterally, no w/r/r, respirations unlabored without use of  accessory muscles.  Chest Wall:  No tenderness or deformity  Heart:  Regular rate and rhythm, S1, S2 normal, no murmur, rub or gallop.  Neurologic: Normal affect, no gross deficits.   Results for orders placed during the hospital encounter of 06/04/13 (from the past 48 hour(s))  APTT     Status: None   Collection Time    06/04/13  7:45 AM      Result Value Range   aPTT 32  24 - 37 seconds  BASIC METABOLIC PANEL     Status: None   Collection Time    06/04/13  7:45 AM      Result Value Range   Sodium 136  135 - 145 mEq/L   Potassium 3.5  3.5 - 5.1 mEq/L   Chloride 99  96 - 112 mEq/L   CO2 30  19 - 32 mEq/L   Glucose, Bld 90  70 - 99 mg/dL   BUN 10  6 - 23 mg/dL   Creatinine, Ser 9.52  0.50 -  1.10 mg/dL   Calcium 9.4  8.4 - 16.1 mg/dL   GFR calc non Af Amer >90  >90 mL/min   GFR calc Af Amer >90  >90 mL/min   Comment:            The eGFR has been calculated     using the CKD EPI equation.     This calculation has not been     validated in all clinical     situations.     eGFR's persistently     <90 mL/min signify     possible Chronic Kidney Disease.  CBC     Status: Abnormal   Collection Time    06/04/13  7:45 AM      Result Value Range   WBC 5.7  4.0 - 10.5 K/uL   RBC 3.54 (*) 3.87 - 5.11 MIL/uL   Hemoglobin 11.4 (*) 12.0 - 15.0 g/dL   HCT 09.6 (*) 04.5 - 40.9 %   MCV 95.5  78.0 - 100.0 fL   MCH 32.2  26.0 - 34.0 pg   MCHC 33.7  30.0 - 36.0 g/dL   RDW 81.1  91.4 - 78.2 %   Platelets 176  150 - 400 K/uL  PROTIME-INR     Status: None   Collection Time    06/04/13  7:45 AM      Result Value Range   Prothrombin Time 12.3  11.6 - 15.2 seconds   INR 0.93  0.00 - 1.49   No results found.  Assessment/Plan Ovarian cancer For Port placement Discussed procedure, risks, complications Labs reviewed, ok Consent signed in chart  Brayton El PA-C 06/04/2013, 9:40 AM

## 2013-06-04 NOTE — Procedures (Signed)
Procedure:  Right IJ port Access:  Right IJ vein Findings:  SL PAC with tip at cavoatrial junction.  OK to use.  No PTX.

## 2013-06-04 NOTE — H&P (Signed)
Agree 

## 2013-06-05 ENCOUNTER — Other Ambulatory Visit: Payer: Medicare Other | Admitting: Lab

## 2013-06-05 ENCOUNTER — Ambulatory Visit (HOSPITAL_BASED_OUTPATIENT_CLINIC_OR_DEPARTMENT_OTHER): Payer: Medicare Other | Admitting: Hematology and Oncology

## 2013-06-05 ENCOUNTER — Ambulatory Visit (HOSPITAL_BASED_OUTPATIENT_CLINIC_OR_DEPARTMENT_OTHER): Payer: Medicare Other

## 2013-06-05 ENCOUNTER — Other Ambulatory Visit: Payer: Self-pay | Admitting: Hematology and Oncology

## 2013-06-05 ENCOUNTER — Telehealth: Payer: Self-pay | Admitting: Hematology and Oncology

## 2013-06-05 ENCOUNTER — Other Ambulatory Visit: Payer: Self-pay

## 2013-06-05 VITALS — BP 120/62 | HR 72 | Temp 98.2°F | Resp 18 | Ht 63.0 in | Wt 138.0 lb

## 2013-06-05 DIAGNOSIS — C569 Malignant neoplasm of unspecified ovary: Secondary | ICD-10-CM

## 2013-06-05 DIAGNOSIS — Z5111 Encounter for antineoplastic chemotherapy: Secondary | ICD-10-CM

## 2013-06-05 LAB — HEPATIC FUNCTION PANEL
Bilirubin, Direct: <0.1 mg/dL (ref 0.0–0.3)
Total Bilirubin: 0.5 mg/dL (ref 0.3–1.2)
Total Protein: 6.7 g/dL (ref 6.0–8.3)

## 2013-06-05 MED ORDER — FAMOTIDINE IN NACL 20-0.9 MG/50ML-% IV SOLN
20.0000 mg | Freq: Once | INTRAVENOUS | Status: AC
Start: 2013-06-05 — End: 2013-06-05
  Administered 2013-06-05: 20 mg via INTRAVENOUS

## 2013-06-05 MED ORDER — SODIUM CHLORIDE 0.9 % IV SOLN: Freq: Once | INTRAVENOUS | Status: DC

## 2013-06-05 MED ORDER — ONDANSETRON 8 MG/50ML IVPB (CHCC)
8.0000 mg | Freq: Once | INTRAVENOUS | Status: AC
  Administered 2013-06-05: 8 mg via INTRAVENOUS

## 2013-06-05 MED ORDER — DEXAMETHASONE SODIUM PHOSPHATE 20 MG/5ML IJ SOLN
20.0000 mg | Freq: Once | INTRAMUSCULAR | Status: AC
  Administered 2013-06-05: 20 mg via INTRAVENOUS

## 2013-06-05 MED ORDER — SODIUM CHLORIDE 0.9 % IV SOLN
80.0000 mg/m2 | Freq: Once | INTRAVENOUS | Status: AC
  Administered 2013-06-05: 132 mg via INTRAVENOUS
  Filled 2013-06-05: qty 22

## 2013-06-05 MED ORDER — FILGRASTIM 300 MCG/0.5ML IJ SOLN
300.0000 ug | Freq: Once | INTRAMUSCULAR | Status: DC
  Filled 2013-06-05: qty 0.5

## 2013-06-05 MED ORDER — HEPARIN SOD (PORK) LOCK FLUSH 100 UNIT/ML IV SOLN
500.0000 [IU] | Freq: Once | INTRAVENOUS | Status: AC | PRN
  Administered 2013-06-05: 500 [IU]
  Filled 2013-06-05: qty 5

## 2013-06-05 MED ORDER — SODIUM CHLORIDE 0.9 % IV SOLN
Freq: Once | INTRAVENOUS | Status: DC
  Administered 2013-06-05: 12:00:00 via INTRAVENOUS

## 2013-06-05 MED ORDER — DIPHENHYDRAMINE HCL 50 MG/ML IJ SOLN
50.0000 mg | Freq: Once | INTRAMUSCULAR | Status: AC
  Administered 2013-06-05: 50 mg via INTRAVENOUS

## 2013-06-05 MED ORDER — SODIUM CHLORIDE 0.9 % IJ SOLN
10.0000 mL | INTRAMUSCULAR | Status: DC | PRN
  Administered 2013-06-05: 10 mL
  Filled 2013-06-05: qty 10

## 2013-06-05 NOTE — Patient Instructions (Signed)
[facility] Discharge Instructions for Patients Receiving Chemotherapy  Today you received the following chemotherapy agents: Taxol.  To help prevent nausea and vomiting after your treatment, we encourage you to take your nausea medication as prescribed.   If you develop nausea and vomiting that is not controlled by your nausea medication, call the clinic.   BELOW ARE SYMPTOMS THAT SHOULD BE REPORTED IMMEDIATELY:  *FEVER GREATER THAN 100.5 F  *CHILLS WITH OR WITHOUT FEVER  NAUSEA AND VOMITING THAT IS NOT CONTROLLED WITH YOUR NAUSEA MEDICATION  *UNUSUAL SHORTNESS OF BREATH  *UNUSUAL BRUISING OR BLEEDING  TENDERNESS IN MOUTH AND THROAT WITH OR WITHOUT PRESENCE OF ULCERS  *URINARY PROBLEMS  *BOWEL PROBLEMS  UNUSUAL RASH Items with * indicate a potential emergency and should be followed up as soon as possible.  Feel free to call the clinic you have any questions or concerns. The clinic phone number is [phone].    

## 2013-06-05 NOTE — Progress Notes (Signed)
OFFICE PROGRESS NOTE   06/05/2013   Physicians:D. ClarkePearson; J.Russo, T.Fontaine, [name], [name]. Last gastroenterologist in [place].   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 She has had a difficult time with chemotherapy since this was begun 03-20-13, including hospitalizations after cycle 2 day 1 and again after cycle 2 day 8; she also needed gCSF added by end of cycle 1. She was hospitalized x2 during cycle 2, then day 15 cycle 2 held with problems requiring the hospitalization then her planned beach vacation. She resumed treatment with day 1 cycle 3 on 05-22-13, with neupogen on 6-25 and 05-24-13 and additional IVF on day 2. She was fatigued/ no energy and had some constipation since most recent treatment, but has felt a little better yesterday and today; she is due day 8 cycle 3 today and took premedication steroids last pm (decadron 20 mg 12 hrs prior). She does not have PAC, but would like to have one now as it frequently takes >1 attempt to start peripheral IV. We have been able to schedule this by IR on 06-04-13.   Oncologic History  She had ~ 2 weeks vaginal spotting in late Dec 2013, then presented to [name] in late Jan 2014 after bright red spotting that AM, with uterus larger than had been apparent on exam in Oct 2013. Sonohystogram 01-05-13 showed uterus normal size and echotexture, endometrium 4.3 mm, left ovary normal and right adnexa with 1.1 x 8.4.x8.2 cm cystic and solid mass. Endometrial biopsy benign and CA 125 also on 01-05-13 was 178.8. She had CT AP 01-17-13 with 1.0 x 6.9 x 8.9 cm complex right ovarian mass, no ascites, small retroperitoneal nodes. She was seen by [name] on 01-18-13 and  taken to surgery by [name] on 02-13-13, which was TAH/BSO/ omentectomy/ureterolysis/ resection of cul-de-sac tumor/right pelvic lymphadnectomy and resection of rectum with reanastomosis. At completion of surgery there was no gross residual disease. 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 Washings [phone]) had rare clusters of atypical cells. Chemotherapy with dose dense taxol carboplatin was begun day 1 cycle 1 on 03-20-13; ANC was 1.1 on day 15 cycle 1 with taxol given and neupogen added 04-04-13. She had day 1 cycle 2 treatment on 04-17-13, then was briefly hospitalized 5-22 to 04-20-13 after syncopal episode, with antihypertensive agents DCd and UTI treated. She was feeling much better at time of "day 8" cycle 2 on 05-01-13 and did have neupogen 300 mcg x 1 dose on 05-02-13. She was readmitted to hospital 6-5 thru 05-05-13 with fever, empirically on antibiotics and blood cultures negative. She then went on planned beach vacation such that day 1 cycle 3 was administered on 05-22-13, with neupogen on 6-25 and 6-26 with IVF also on day 2.   Lack of energy is difficult for her to tolerate. Bowels did not move after last treatment until day 6, did move well again today; we have discussed laxatives. She had slight intermittent tingling in hands which is not interfering with function. She feels she is taking too many pills. No bleeding, no fever  or symptoms of infection including no bladder symptoms. She is trying to drink fluids. She used warm soaks to site of last IV, no longer tender. No syncopal episodes. She has follow up with Dr Timothy Lasso upcoming. Remainder of 10 point Review of Systems negative.  Objective:  Vital signs in last 24 hours:  BP 120/62   Pulse 72  Temp(Src) 98.2 F (36.8 C) (Oral)  Resp 18  Ht 5\' 3"  (1.6 m)  Wt 138 lb (62.596 kg)  BMI 24.45 kg/m2  Weight is stable. Easily mobile, wearing wig, NAD.  HEENT:PERRLA, sclera clear, anicteric and oropharynx clear, no lesions. ALopecia. Mucous membranes moist LymphaticsCervical, supraclavicular, and axillary nodes normal. No inguinal adenopathy Resp: clear to auscultation bilaterally and normal percussion bilaterally Cardio: regular rate and rhythm GI: soft, non-tender; bowel sounds normal; no masses,  no organomegaly not distended. Surgical scar not remarkable Extremities: no edema or tenderness, no cords LE. Right forearm laterally with 3 cm nontender cord no erythema, at site of recent IV. Neuro:no sensory deficits noted Breast:normal without suspicious masses, skin or nipple changes or axillary nodes Skin no rash or ecchymosis  Lab Results:  Results for orders placed during the hospital encounter of 06/04/13  APTT      Result Value Range   aPTT 32  24 - 37 seconds  BASIC METABOLIC PANEL      Result Value Range   Sodium 136  135 - 145 mEq/L   Potassium 3.5  3.5 - 5.1 mEq/L   Chloride 99  96 - 112 mEq/L   CO2 30  19 - 32 mEq/L   Glucose, Bld 90  70 - 99 mg/dL   BUN 10  6 - 23 mg/dL   Creatinine, Ser 9.14  0.50 - 1.10 mg/dL   Calcium 9.4  8.4 - 78.2 mg/dL   GFR calc non Af Amer >90  >90 mL/min   GFR calc Af Amer >90  >90 mL/min  CBC      Result Value Range   WBC 5.7  4.0 - 10.5 K/uL   RBC 3.54 (*) 3.87 - 5.11 MIL/uL   Hemoglobin 11.4 (*) 12.0 - 15.0 g/dL   HCT 95.6 (*) 21.3 - 08.6 %   MCV 95.5  78.0 - 100.0 fL   MCH 32.2  26.0 - 34.0 pg   MCHC 33.7  30.0 - 36.0 g/dL   RDW 57.8  46.9 - 62.9 %   Platelets 176  150 - 400 K/uL  PROTIME-INR      Result Value Range   Prothrombin Time 12.3  11.6 - 15.2 seconds   INR 0.93  0.00 - 1.49    Last CMET 6-23 noted, normal with exception of T prot 6.3 Last CA 125 17.7 on 05-21-13, this down from preop  178  Studies/Results:  Ir Fluoro Guide Cv Line Left  06/04/2013   *RADIOLOGY REPORT*  Clinical Data: Ovarian carcinoma and need for Port-A-Cath for continued chemotherapy.  IMPLANTED PORT A CATH PLACEMENT WITH ULTRASOUND AND FLUOROSCOPIC GUIDANCE  Sedation:  2.0 mg IV Versed; 100 mcg IV Fentanyl.  Total Moderate Sedation Time:  35 minutes.  Additional Medications:  2 grams IV Ancef.  As antibiotic prophylaxis, Ancef  was ordered pre-procedure and administered intravenously within one hour of incision.  Fluoroscopy Time:  12 seconds.  Procedure:  The procedure, risks, benefits, and alternatives were explained to the patient.  Questions regarding the procedure were encouraged and answered.  The patient understands and consents to the procedure.  The  right neck and chest were prepped with chlorhexidine in a sterile fashion, and a sterile drape was applied covering the operative field.  Maximum barrier sterile technique with sterile gowns and gloves were used for the procedure.  Local anesthesia was provided with 1% lidocaine and lidocaine with epinephrine.  Ultrasound was used to confirm patency of the right internal jugular vein.  After creating a small venotomy incision, a 21 gauge needle was advanced into the right internal jugular vein under direct, real-time ultrasound guidance.  Ultrasound image documentation was performed.  After securing guidewire access, an 8 Fr dilator was placed.  A J-wire was kinked to measure appropriate catheter length.  A subcutaneous port pocket was then created along the upper chest wall utilizing sharp and blunt dissection.  Portable cautery was utilized.  The pocket was irrigated with sterile saline.  A single lumen power injectable port was chosen for placement.  The 8 Fr catheter was tunneled from the port pocket site to the venotomy incision.  The port was placed in the pocket.  External catheter was trimmed to appropriate length based on guidewire measurement.  At the  venotomy, an 8 Fr peel-away sheath was placed over a guidewire.  The catheter was then placed through the sheath and the sheath removed.  Final catheter positioning was confirmed and documented with a fluoroscopic spot image.  The port was accessed with a needle and aspirated and flushed with heparinized saline. The needle was removed.  The venotomy and port pocket incisions were closed with subcutaneous 3-0 Monocryl and subcuticular 4-0 Vicryl.  Dermabond was applied to both incisions.  Complications: None.  No pneumothorax.  Findings:  After catheter placement, the tip lies at the cavoatrial junction.  The catheter aspirates normally and is ready for immediate use.  IMPRESSION:  Placement of single lumen port a cath via right internal jugular vein.  The catheter tip lies at the cavoatrial junction.  A power injectable port a cath was placed and is ready for immediate use.   Original Report Authenticated By: Irish Lack, M.D.   Ir US Guide Vasc Access Right  06/04/2013   *RADIOLOGY REPORT*  Clinical Data: Ovarian carcinoma and need for Port-A-Cath for continued chemotherapy.  IMPLANTED PORT A CATH PLACEMENT WITH ULTRASOUND AND FLUOROSCOPIC GUIDANCE  Sedation:  2.0 mg IV Versed; 100 mcg IV Fentanyl.  Total Moderate Sedation Time:  35 minutes.  Additional Medications:  2 grams IV Ancef.  As antibiotic prophylaxis, Ancef  was ordered pre-procedure and administered intravenously within one hour of incision.  Fluoroscopy Time:  12 seconds.  Procedure:  The procedure, risks, benefits, and alternatives were explained to the patient.  Questions regarding the procedure were encouraged and answered.  The patient understands and consents to the procedure.  The right neck and chest were prepped with chlorhexidine in a sterile fashion, and a sterile drape was applied covering the operative field.  Maximum barrier sterile technique with sterile gowns and gloves were used for the procedure.  Local anesthesia was provided  with 1% lidocaine and lidocaine with epinephrine.  Ultrasound was used to confirm patency of the right internal jugular vein.  After creating a small venotomy incision, a 21 gauge needle was advanced into the right internal jugular vein under direct, real-time ultrasound guidance.  Ultrasound image documentation was performed.  After securing guidewire access, an 8 Fr dilator was placed.  A J-wire was kinked to measure appropriate catheter length.  A subcutaneous port pocket was then created along the upper chest  wall utilizing sharp and blunt dissection.  Portable cautery was utilized.  The pocket was irrigated with sterile saline.  A single lumen power injectable port was chosen for placement.  The 8 Fr catheter was tunneled from the port pocket site to the venotomy incision.  The port was placed in the pocket.  External catheter was trimmed to appropriate length based on guidewire measurement.  At the venotomy, an 8 Fr peel-away sheath was placed over a guidewire.  The catheter was then placed through the sheath and the sheath removed.  Final catheter positioning was confirmed and documented with a fluoroscopic spot image.  The port was accessed with a needle and aspirated and flushed with heparinized saline. The needle was removed.  The venotomy and port pocket incisions were closed with subcutaneous 3-0 Monocryl and subcuticular 4-0 Vicryl.  Dermabond was applied to both incisions.  Complications: None.  No pneumothorax.  Findings:  After catheter placement, the tip lies at the cavoatrial junction.  The catheter aspirates normally and is ready for immediate use.  IMPRESSION:  Placement of single lumen port a cath via right internal jugular vein.  The catheter tip lies at the cavoatrial junction.  A power injectable port a cath was placed and is ready for immediate use.   Original Report Authenticated By: Irish Lack, M.D.    Medications: I have reviewed the patient's current medications. She will hold  glucosamine as arthritis is not as bothersome since she is on weekly decadron for the taxol. She will hold K+ as last values good and she is off HCTZ, and chemistries will be followed for this.   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 Cycle 2 delayed one week with neutropenia, then short hospitalizations after cycle 2 day 1 and again after day 8. Day 15 cycle 2 omitted due to beach vacation. Cycle 3 day 1 given 05-22-13 with neupogen 6-25 and 6-26 as well as additional IVF; for day 8 today. She should have neupogen days 2,3,8,16 each cycle and IVF on days 2, 9, 16 each cycle, with total 6 cycles planned. She will receive her C3 D15 today then neupogen and IV fluid tomorrow. Patient will rerun to clinic in 1 week on 7/15 for clinic visit and to start her C4 D1 of treatment.  Dr Wanita Chamberlain last note mentions follow up with him after 4 cycles; she will also need restaging CT AP and visit back to gyn oncology a few weeks after completion of chemotherapy.  2.HTN, elevated lipids, one episode of paroxysmal Afib. On pradaxa and ASA. HCTZ and losartan held since 04-09-13 and hydralazine now prn. Dr Ferd Hibbs help much appreciated.  3.hypothyroidism on replacement  4.osteoarthritis, post right hip replacement 2013  5. Previous enterobacter UTI 6. Peripheral IV access difficult for patient, PAC to be placed by IR on 06-04-13. EMLA script to pharmacy.    Zachery Dakins, MD   06/05/2013, 12:04 PM

## 2013-06-05 NOTE — Telephone Encounter (Signed)
gv and printed appt sched for pt. °

## 2013-06-05 NOTE — Patient Instructions (Addendum)
[facility] Discharge Instructions for Patients Receiving Chemotherapy  Today you received the following chemotherapy agents Taxol  To help prevent nausea and vomiting after your treatment, we encourage you to take your nausea medication    If you develop nausea and vomiting that is not controlled by your nausea medication, call the clinic.   BELOW ARE SYMPTOMS THAT SHOULD BE REPORTED IMMEDIATELY:  *FEVER GREATER THAN 100.5 F  *CHILLS WITH OR WITHOUT FEVER  NAUSEA AND VOMITING THAT IS NOT CONTROLLED WITH YOUR NAUSEA MEDICATION  *UNUSUAL SHORTNESS OF BREATH  *UNUSUAL BRUISING OR BLEEDING  TENDERNESS IN MOUTH AND THROAT WITH OR WITHOUT PRESENCE OF ULCERS  *URINARY PROBLEMS  *BOWEL PROBLEMS  UNUSUAL RASH Items with * indicate a potential emergency and should be followed up as soon as possible.  Feel free to call the clinic you have any questions or concerns. The clinic phone number is [phone].    

## 2013-06-06 ENCOUNTER — Ambulatory Visit: Payer: Medicare Other

## 2013-06-06 ENCOUNTER — Ambulatory Visit (HOSPITAL_BASED_OUTPATIENT_CLINIC_OR_DEPARTMENT_OTHER): Payer: Medicare Other

## 2013-06-06 VITALS — BP 145/71 | HR 68 | Temp 97.1°F | Resp 18

## 2013-06-06 DIAGNOSIS — R5383 Other fatigue: Secondary | ICD-10-CM

## 2013-06-06 DIAGNOSIS — Z5189 Encounter for other specified aftercare: Secondary | ICD-10-CM

## 2013-06-06 DIAGNOSIS — R971 Elevated cancer antigen 125 [CA 125]: Secondary | ICD-10-CM

## 2013-06-06 DIAGNOSIS — C569 Malignant neoplasm of unspecified ovary: Secondary | ICD-10-CM

## 2013-06-06 MED ORDER — SODIUM CHLORIDE 0.9 % IV SOLN
Freq: Once | INTRAVENOUS | Status: AC
  Administered 2013-06-06: 10:00:00 via INTRAVENOUS

## 2013-06-06 MED ORDER — SODIUM CHLORIDE 0.9 % IJ SOLN
10.0000 mL | Freq: Once | INTRAMUSCULAR | Status: AC
  Administered 2013-06-06: 10 mL via INTRAVENOUS
  Filled 2013-06-06: qty 10

## 2013-06-06 MED ORDER — FILGRASTIM 300 MCG/0.5ML IJ SOLN
300.0000 ug | Freq: Once | INTRAMUSCULAR | Status: AC
  Administered 2013-06-06: 300 ug via SUBCUTANEOUS
  Filled 2013-06-06: qty 0.5

## 2013-06-06 MED ORDER — HEPARIN SOD (PORK) LOCK FLUSH 100 UNIT/ML IV SOLN
500.0000 [IU] | Freq: Once | INTRAVENOUS | Status: AC
  Administered 2013-06-06: 500 [IU] via INTRAVENOUS
  Filled 2013-06-06: qty 5

## 2013-06-12 ENCOUNTER — Ambulatory Visit (HOSPITAL_BASED_OUTPATIENT_CLINIC_OR_DEPARTMENT_OTHER): Payer: Medicare Other

## 2013-06-12 ENCOUNTER — Other Ambulatory Visit: Payer: Self-pay

## 2013-06-12 ENCOUNTER — Telehealth: Payer: Self-pay | Admitting: *Deleted

## 2013-06-12 ENCOUNTER — Telehealth: Payer: Self-pay | Admitting: Hematology and Oncology

## 2013-06-12 ENCOUNTER — Other Ambulatory Visit (HOSPITAL_BASED_OUTPATIENT_CLINIC_OR_DEPARTMENT_OTHER): Payer: Medicare Other | Admitting: Lab

## 2013-06-12 ENCOUNTER — Ambulatory Visit (HOSPITAL_BASED_OUTPATIENT_CLINIC_OR_DEPARTMENT_OTHER): Payer: Medicare Other | Admitting: Hematology and Oncology

## 2013-06-12 VITALS — BP 140/77 | HR 72 | Temp 97.3°F | Resp 18 | Ht 63.0 in | Wt 137.9 lb

## 2013-06-12 DIAGNOSIS — C569 Malignant neoplasm of unspecified ovary: Secondary | ICD-10-CM

## 2013-06-12 DIAGNOSIS — Z5111 Encounter for antineoplastic chemotherapy: Secondary | ICD-10-CM

## 2013-06-12 LAB — CBC WITH DIFFERENTIAL/PLATELET
BASO%: 0.2 % (ref 0.0–2.0)
Basophils Absolute: 0 10*3/uL (ref 0.0–0.1)
Eosinophils Absolute: 0 10*3/uL (ref 0.0–0.5)
HCT: 32.4 % — ABNORMAL LOW (ref 34.8–46.6)
HGB: 11 g/dL — ABNORMAL LOW (ref 11.6–15.9)
LYMPH%: 14.6 % (ref 14.0–49.7)
MCHC: 34 g/dL (ref 31.5–36.0)
MONO#: 0.1 10*3/uL (ref 0.1–0.9)
NEUT#: 4 10*3/uL (ref 1.5–6.5)
NEUT%: 83.7 % — ABNORMAL HIGH (ref 38.4–76.8)
Platelets: 204 10*3/uL (ref 145–400)
WBC: 4.8 10*3/uL (ref 3.9–10.3)
lymph#: 0.7 10*3/uL — ABNORMAL LOW (ref 0.9–3.3)

## 2013-06-12 LAB — COMPREHENSIVE METABOLIC PANEL (CC13)
AST: 17 U/L (ref 5–34)
Albumin: 3.7 g/dL (ref 3.5–5.0)
BUN: 12.8 mg/dL (ref 7.0–26.0)
CO2: 25 mEq/L (ref 22–29)
Calcium: 9.4 mg/dL (ref 8.4–10.4)
Chloride: 101 mEq/L (ref 98–109)
Creatinine: 0.6 mg/dL (ref 0.6–1.1)
Potassium: 4.1 mEq/L (ref 3.5–5.1)

## 2013-06-12 MED ORDER — SODIUM CHLORIDE 0.9 % IV SOLN
Freq: Once | INTRAVENOUS | Status: AC
  Administered 2013-06-12: 13:00:00 via INTRAVENOUS

## 2013-06-12 MED ORDER — SODIUM CHLORIDE 0.9 % IV SOLN
500.0000 mg | Freq: Once | INTRAVENOUS | Status: AC
  Administered 2013-06-12: 500 mg via INTRAVENOUS
  Filled 2013-06-12: qty 50

## 2013-06-12 MED ORDER — DIPHENHYDRAMINE HCL 50 MG/ML IJ SOLN
25.0000 mg | Freq: Once | INTRAMUSCULAR | Status: AC
  Administered 2013-06-12: 25 mg via INTRAVENOUS

## 2013-06-12 MED ORDER — SODIUM CHLORIDE 0.9 % IV SOLN
80.0000 mg/m2 | Freq: Once | INTRAVENOUS | Status: AC
  Administered 2013-06-12: 132 mg via INTRAVENOUS
  Filled 2013-06-12: qty 22

## 2013-06-12 MED ORDER — DEXAMETHASONE SODIUM PHOSPHATE 20 MG/5ML IJ SOLN
20.0000 mg | Freq: Once | INTRAMUSCULAR | Status: AC
  Administered 2013-06-12: 20 mg via INTRAVENOUS

## 2013-06-12 MED ORDER — ONDANSETRON 16 MG/50ML IVPB (CHCC)
16.0000 mg | Freq: Once | INTRAVENOUS | Status: AC
  Administered 2013-06-12: 16 mg via INTRAVENOUS

## 2013-06-12 MED ORDER — HEPARIN SOD (PORK) LOCK FLUSH 100 UNIT/ML IV SOLN
500.0000 [IU] | Freq: Once | INTRAVENOUS | Status: AC | PRN
  Administered 2013-06-12: 500 [IU]
  Filled 2013-06-12: qty 5

## 2013-06-12 MED ORDER — DEXAMETHASONE 4 MG PO TABS: ORAL_TABLET | ORAL | Status: DC

## 2013-06-12 MED ORDER — RANITIDINE HCL 50 MG/2ML IJ SOLN
50.0000 mg | Freq: Once | INTRAMUSCULAR | Status: AC
  Administered 2013-06-12: 50 mg via INTRAVENOUS
  Filled 2013-06-12: qty 2

## 2013-06-12 MED ORDER — FAMOTIDINE IN NACL 20-0.9 MG/50ML-% IV SOLN: 20.0000 mg | Freq: Once | INTRAVENOUS | Status: DC

## 2013-06-12 MED ORDER — SODIUM CHLORIDE 0.9 % IJ SOLN
10.0000 mL | INTRAMUSCULAR | Status: DC | PRN
  Administered 2013-06-12: 10 mL
  Filled 2013-06-12: qty 10

## 2013-06-12 NOTE — Patient Instructions (Addendum)
[facility] Discharge Instructions for Patients Receiving Chemotherapy  Today you received the following chemotherapy agents: Taxol, Carboplatin   To help prevent nausea and vomiting after your treatment, we encourage you to take your nausea medication as directed by your physician.   If you develop nausea and vomiting that is not controlled by your nausea medication, call the clinic.   BELOW ARE SYMPTOMS THAT SHOULD BE REPORTED IMMEDIATELY:  *FEVER GREATER THAN 100.5 F  *CHILLS WITH OR WITHOUT FEVER  NAUSEA AND VOMITING THAT IS NOT CONTROLLED WITH YOUR NAUSEA MEDICATION  *UNUSUAL SHORTNESS OF BREATH  *UNUSUAL BRUISING OR BLEEDING  TENDERNESS IN MOUTH AND THROAT WITH OR WITHOUT PRESENCE OF ULCERS  *URINARY PROBLEMS  *BOWEL PROBLEMS  UNUSUAL RASH Items with * indicate a potential emergency and should be followed up as soon as possible.  Feel free to call the clinic you have any questions or concerns. The clinic phone number is [phone].    

## 2013-06-12 NOTE — Telephone Encounter (Signed)
Gave pt appt for lab, MD and chemo for July and August 2014 pt wants to see Dr. Tobey Bride and our MD same day,

## 2013-06-12 NOTE — Telephone Encounter (Signed)
Per staff phone call and POF I have schedueld appts.  JMW  

## 2013-06-12 NOTE — Progress Notes (Signed)
OFFICE PROGRESS NOTE   06/12/2013   Physicians:D. ClarkePearson; J.Russo, T.Fontaine, [name], [name]. Last gastroenterologist in [place].   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 She has had a difficult time with chemotherapy since this was begun 03-20-13, including hospitalizations after cycle 2 day 1 and again after cycle 2 day 8; she also needed gCSF added by end of cycle 1. She was hospitalized x2 during cycle 2, then day 15 cycle 2 held with problems requiring the hospitalization then her planned beach vacation. She resumed treatment with day 1 cycle 3 on 05-22-13, with neupogen on 6-25 and 05-24-13 and additional IVF on day 2. She was fatigued/ no energy and had some constipation since most recent treatment, but has felt a little better yesterday and today; she is due day 8 cycle 3 today and took premedication steroids last pm (decadron 20 mg 12 hrs prior). She does not have PAC, but would like to have one now as it frequently takes >1 attempt to start peripheral IV. We have been able to schedule this by IR on 06-04-13.   Oncologic History  She had ~ 2 weeks vaginal spotting in late Dec 2013, then presented to [name] in late Jan 2014 after bright red spotting that AM, with uterus larger than had been apparent on exam in Oct 2013. Sonohystogram 01-05-13 showed uterus normal size and echotexture, endometrium 4.3 mm, left ovary normal and right adnexa with 1.1 x 8.4.x8.2 cm cystic and solid mass. Endometrial biopsy benign and CA 125 also on 01-05-13 was 178.8. She had CT AP 01-17-13 with 1.0 x 6.9 x 8.9 cm complex right ovarian mass, no ascites, small retroperitoneal nodes. She was seen by [name] on 01-18-13  and taken to surgery by [name] on 02-13-13, which was TAH/BSO/ omentectomy/ureterolysis/ resection of cul-de-sac tumor/right pelvic lymphadnectomy and resection of rectum with reanastomosis. At completion of surgery there was no gross residual disease. 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 Washings [phone]) had rare clusters of atypical cells. Chemotherapy with dose dense taxol carboplatin was begun day 1 cycle 1 on 03-20-13; ANC was 1.1 on day 15 cycle 1 with taxol given and neupogen added 04-04-13. She had day 1 cycle 2 treatment on 04-17-13, then was briefly hospitalized 5-22 to 04-20-13 after syncopal episode, with antihypertensive agents DCd and UTI treated. She was feeling much better at time of "day 8" cycle 2 on 05-01-13 and did have neupogen 300 mcg x 1 dose on 05-02-13. She was readmitted to hospital 6-5 thru 05-05-13 with fever, empirically on antibiotics and blood cultures negative. She then went on planned beach vacation such that day 1 cycle 3 was administered on 05-22-13, with neupogen on 6-25 and 6-26 with IVF also on day 2.   She is doing better today, no vomiting but mild nausea, no fever or symptoms of infection including no bladder symptoms. She is trying to drink fluids.  No syncopal episodes. She has follow up with Dr Timothy Lasso upcoming. Remainder of 10 point Review of Systems negative.  Objective:  Vital signs in  last 24 hours:  BP 140/77  Pulse 72  Temp(Src) 97.3 F (36.3 C) (Oral)  Resp 18  Ht 5\' 3"  (1.6 m)  Wt 137 lb 14.4 oz (62.551 kg)  BMI 24.43 kg/m2  Weight is stable. Easily mobile, wearing wig, NAD.  HEENT:PERRLA, sclera clear, anicteric and oropharynx clear, no lesions. ALopecia. Mucous membranes  moist LymphaticsCervical, supraclavicular, and axillary nodes normal. No inguinal adenopathy Resp: clear to auscultation bilaterally and normal percussion bilaterally Cardio: regular rate and rhythm GI: soft, non-tender; bowel sounds normal; no masses,  no organomegaly not distended. Surgical scar not remarkable Extremities: no edema or tenderness, no cords LE. Right forearm laterally with 3 cm nontender cord no erythema, at site of recent IV. Neuro:no sensory deficits noted Breast:normal without suspicious masses, skin or nipple changes or axillary nodes Skin no rash or ecchymosis  Lab Results:  Results for orders placed in visit on 06/12/13  CBC WITH DIFFERENTIAL      Result Value Range   WBC 4.8  3.9 - 10.3 10e3/uL   NEUT# 4.0  1.5 - 6.5 10e3/uL   HGB 11.0 (*) 11.6 - 15.9 g/dL   HCT 16.1 (*) 09.6 - 04.5 %   Platelets 204  145 - 400 10e3/uL   MCV 95.0  79.5 - 101.0 fL   MCH 32.3  25.1 - 34.0 pg   MCHC 34.0  31.5 - 36.0 g/dL   RBC 4.09 (*) 8.11 - 9.14 10e6/uL   RDW 15.3 (*) 11.2 - 14.5 %   lymph# 0.7 (*) 0.9 - 3.3 10e3/uL   MONO# 0.1  0.1 - 0.9 10e3/uL   Eosinophils Absolute 0.0  0.0 - 0.5 10e3/uL   Basophils Absolute 0.0  0.0 - 0.1 10e3/uL   NEUT% 83.7 (*) 38.4 - 76.8 %   LYMPH% 14.6  14.0 - 49.7 %   MONO% 1.5  0.0 - 14.0 %   EOS% 0.0  0.0 - 7.0 %   BASO% 0.2  0.0 - 2.0 %   nRBC 0  0 - 0 %    Lab Results  Component Value Date   GLUCOSE 90 06/04/2013   BUN 10 06/04/2013   CO2 30 06/04/2013   ALT 14 06/04/2013   AST 18 06/04/2013   K 3.5 06/04/2013   CREATININE 0.58 06/04/2013   Last CMET 6-23 noted, normal with exception of T prot 6.3 Last CA 125 17.7 on 05-21-13, this down from preop 178  Studies/Results:  No results found.  Medications: I have reviewed the patient's current medications. She will hold glucosamine as arthritis is not as bothersome since she is on weekly decadron for the taxol. She will hold K+ as last values good and she is off HCTZ, and chemistries will be  followed for this.   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 Cycle 2 delayed one week with neutropenia, then short hospitalizations after cycle 2 day 1 and again after day 8. Day 15 cycle 2 omitted due to beach vacation. Cycle 3 day 1 given 05-22-13 with neupogen 6-25 and 6-26 as well as additional IVF; for day 8 today. She should have neupogen days 2,3,8,16 each cycle and IVF on days 2, 9, 16 each cycle, with total 6 cycles planned.   -She is scheduled to receive her cycle 4 day 1 today 06/12/2013 then will come tomorrow  for IV fluids and Neupogen.   Note patient is reluctant to have the extra IVF, however agrees that we need to be proactive due to previous hospitalizations. Dr Wanita Chamberlain last note mentions follow up with him after 4 cycles; she will also need restaging CT AP and visit back to gyn oncology a few weeks after completion of chemotherapy.  2.HTN, elevated lipids, one episode of paroxysmal Afib. On pradaxa and ASA. HCTZ and losartan held since 04-09-13 and hydralazine now prn. Dr Ferd Hibbs help much appreciated.  3.hypothyroidism on replacement  4.osteoarthritis, post right hip replacement 2013  5. Previous enterobacter UTI 6. Peripheral IV access difficult for patient, PAC to be placed by IR on 06-04-13. EMLA script to pharmacy.    Zachery Dakins, MD   06/12/2013, 11:46 AM

## 2013-06-13 ENCOUNTER — Other Ambulatory Visit: Payer: Self-pay | Admitting: *Deleted

## 2013-06-13 ENCOUNTER — Other Ambulatory Visit: Payer: Self-pay | Admitting: Hematology and Oncology

## 2013-06-13 ENCOUNTER — Ambulatory Visit: Payer: Medicare Other

## 2013-06-13 ENCOUNTER — Ambulatory Visit (HOSPITAL_BASED_OUTPATIENT_CLINIC_OR_DEPARTMENT_OTHER): Payer: Medicare Other

## 2013-06-13 VITALS — BP 133/78 | HR 70 | Temp 98.7°F | Resp 18

## 2013-06-13 DIAGNOSIS — D709 Neutropenia, unspecified: Secondary | ICD-10-CM

## 2013-06-13 DIAGNOSIS — E860 Dehydration: Secondary | ICD-10-CM

## 2013-06-13 DIAGNOSIS — R971 Elevated cancer antigen 125 [CA 125]: Secondary | ICD-10-CM

## 2013-06-13 DIAGNOSIS — C569 Malignant neoplasm of unspecified ovary: Secondary | ICD-10-CM

## 2013-06-13 MED ORDER — FILGRASTIM 300 MCG/0.5ML IJ SOLN
300.0000 ug | Freq: Once | INTRAMUSCULAR | Status: AC
  Administered 2013-06-13: 300 ug via SUBCUTANEOUS
  Filled 2013-06-13: qty 0.5

## 2013-06-13 MED ORDER — SODIUM CHLORIDE 0.9 % IV SOLN
INTRAVENOUS | Status: AC
  Administered 2013-06-13: 11:00:00 via INTRAVENOUS

## 2013-06-13 MED ORDER — HEPARIN SOD (PORK) LOCK FLUSH 100 UNIT/ML IV SOLN
500.0000 [IU] | Freq: Once | INTRAVENOUS | Status: AC
  Administered 2013-06-13: 500 [IU] via INTRAVENOUS
  Filled 2013-06-13: qty 5

## 2013-06-13 MED ORDER — SODIUM CHLORIDE 0.9 % IJ SOLN
10.0000 mL | INTRAMUSCULAR | Status: DC | PRN
  Administered 2013-06-13: 10 mL via INTRAVENOUS
  Filled 2013-06-13: qty 10

## 2013-06-13 NOTE — Patient Instructions (Addendum)
Dehydration, Adult Dehydration is when you lose more fluids from the body than you take in. Vital organs like the kidneys, brain, and heart cannot function without a proper amount of fluids and salt. Any loss of fluids from the body can cause dehydration.  CAUSES   Vomiting.  Diarrhea.  Excessive sweating.  Excessive urine output.  Fever. SYMPTOMS  Mild dehydration  Thirst.  Dry lips.  Slightly dry mouth. Moderate dehydration  Very dry mouth.  Sunken eyes.  Skin does not bounce back quickly when lightly pinched and released.  Dark urine and decreased urine production.  Decreased tear production.  Headache. Severe dehydration  Very dry mouth.  Extreme thirst.  Rapid, weak pulse (more than 100 beats per minute at rest).  Cold hands and feet.  Not able to sweat in spite of heat and temperature.  Rapid breathing.  Blue lips.  Confusion and lethargy.  Difficulty being awakened.  Minimal urine production.  No tears. DIAGNOSIS  Your caregiver will diagnose dehydration based on your symptoms and your exam. Blood and urine tests will help confirm the diagnosis. The diagnostic evaluation should also identify the cause of dehydration. TREATMENT  Treatment of mild or moderate dehydration can often be done at home by increasing the amount of fluids that you drink. It is best to drink small amounts of fluid more often. Drinking too much at one time can make vomiting worse. Refer to the home care instructions below. Severe dehydration needs to be treated at the hospital where you will probably be given intravenous (IV) fluids that contain water and electrolytes. HOME CARE INSTRUCTIONS   Ask your caregiver about specific rehydration instructions.  Drink enough fluids to keep your urine clear or pale yellow.  Drink small amounts frequently if you have nausea and vomiting.  Eat as you normally do.  Avoid:  Foods or drinks high in sugar.  Carbonated  drinks.  Juice.  Extremely hot or cold fluids.  Drinks with caffeine.  Fatty, greasy foods.  Alcohol.  Tobacco.  Overeating.  Gelatin desserts.  Wash your hands well to avoid spreading bacteria and viruses.  Only take over-the-counter or prescription medicines for pain, discomfort, or fever as directed by your caregiver.  Ask your caregiver if you should continue all prescribed and over-the-counter medicines.  Keep all follow-up appointments with your caregiver. SEEK MEDICAL CARE IF:  You have abdominal pain and it increases or stays in one area (localizes).  You have a rash, stiff neck, or severe headache.  You are irritable, sleepy, or difficult to awaken.  You are weak, dizzy, or extremely thirsty. SEEK IMMEDIATE MEDICAL CARE IF:   You are unable to keep fluids down or you get worse despite treatment.  You have frequent episodes of vomiting or diarrhea.  You have blood or green matter (bile) in your vomit.  You have blood in your stool or your stool looks black and tarry.  You have not urinated in 6 to 8 hours, or you have only urinated a small amount of very dark urine.  You have a fever.  You faint. MAKE SURE YOU:   Understand these instructions.  Will watch your condition.  Will get help right away if you are not doing well or get worse. Document Released: 11/15/2005 Document Revised: 02/07/2012 Document Reviewed: 07/05/2011 Exit[employer] Patient Information 2014 [employer].  

## 2013-06-19 ENCOUNTER — Other Ambulatory Visit (HOSPITAL_BASED_OUTPATIENT_CLINIC_OR_DEPARTMENT_OTHER): Payer: Medicare Other | Admitting: Lab

## 2013-06-19 ENCOUNTER — Ambulatory Visit (HOSPITAL_BASED_OUTPATIENT_CLINIC_OR_DEPARTMENT_OTHER): Payer: Medicare Other

## 2013-06-19 ENCOUNTER — Other Ambulatory Visit: Payer: Self-pay | Admitting: Hematology and Oncology

## 2013-06-19 ENCOUNTER — Other Ambulatory Visit: Payer: Medicare Other | Admitting: Lab

## 2013-06-19 VITALS — BP 135/72 | HR 70 | Temp 97.6°F | Resp 16

## 2013-06-19 DIAGNOSIS — C569 Malignant neoplasm of unspecified ovary: Secondary | ICD-10-CM

## 2013-06-19 DIAGNOSIS — C561 Malignant neoplasm of right ovary: Secondary | ICD-10-CM

## 2013-06-19 DIAGNOSIS — Z5111 Encounter for antineoplastic chemotherapy: Secondary | ICD-10-CM

## 2013-06-19 LAB — CBC & DIFF AND RETIC
BASO%: 0 % (ref 0.0–2.0)
Basophils Absolute: 0 10*3/uL (ref 0.0–0.1)
EOS%: 0 % (ref 0.0–7.0)
HGB: 9.8 g/dL — ABNORMAL LOW (ref 11.6–15.9)
MCH: 32.1 pg (ref 25.1–34.0)
MCHC: 34 g/dL (ref 31.5–36.0)
MCV: 94.4 fL (ref 79.5–101.0)
MONO%: 1.9 % (ref 0.0–14.0)
NEUT%: 84.8 % — ABNORMAL HIGH (ref 38.4–76.8)
RDW: 14.7 % — ABNORMAL HIGH (ref 11.2–14.5)
Retic %: 1.37 % (ref 0.70–2.10)

## 2013-06-19 LAB — BASIC METABOLIC PANEL (CC13)
BUN: 13.4 mg/dL (ref 7.0–26.0)
CO2: 26 mEq/L (ref 22–29)
Calcium: 9.3 mg/dL (ref 8.4–10.4)
Chloride: 102 mEq/L (ref 98–109)
Creatinine: 0.6 mg/dL (ref 0.6–1.1)
Glucose: 138 mg/dl (ref 70–140)
Potassium: 4 mEq/L (ref 3.5–5.1)
Sodium: 136 mEq/L (ref 136–145)

## 2013-06-19 MED ORDER — FAMOTIDINE IN NACL 20-0.9 MG/50ML-% IV SOLN: 20.0000 mg | Freq: Once | INTRAVENOUS | Status: DC

## 2013-06-19 MED ORDER — SODIUM CHLORIDE 0.9 % IJ SOLN
10.0000 mL | INTRAMUSCULAR | Status: DC | PRN
  Administered 2013-06-19: 10 mL
  Filled 2013-06-19: qty 10

## 2013-06-19 MED ORDER — SODIUM CHLORIDE 0.9 % IV SOLN
50.0000 mg | Freq: Once | INTRAVENOUS | Status: AC
  Administered 2013-06-19: 50 mg via INTRAVENOUS
  Filled 2013-06-19: qty 2

## 2013-06-19 MED ORDER — DIPHENHYDRAMINE HCL 50 MG/ML IJ SOLN
25.0000 mg | Freq: Once | INTRAMUSCULAR | Status: AC
  Administered 2013-06-19: 25 mg via INTRAVENOUS

## 2013-06-19 MED ORDER — HEPARIN SOD (PORK) LOCK FLUSH 100 UNIT/ML IV SOLN
500.0000 [IU] | Freq: Once | INTRAVENOUS | Status: AC | PRN
  Administered 2013-06-19: 500 [IU]
  Filled 2013-06-19: qty 5

## 2013-06-19 MED ORDER — SODIUM CHLORIDE 0.9 % IV SOLN
Freq: Once | INTRAVENOUS | Status: AC
  Administered 2013-06-19: 12:00:00 via INTRAVENOUS

## 2013-06-19 MED ORDER — DEXAMETHASONE SODIUM PHOSPHATE 20 MG/5ML IJ SOLN
20.0000 mg | Freq: Once | INTRAMUSCULAR | Status: AC
  Administered 2013-06-19: 20 mg via INTRAVENOUS

## 2013-06-19 MED ORDER — PACLITAXEL CHEMO INJECTION 300 MG/50ML
80.0000 mg/m2 | Freq: Once | INTRAVENOUS | Status: AC
  Administered 2013-06-19: 132 mg via INTRAVENOUS
  Filled 2013-06-19: qty 22

## 2013-06-19 MED ORDER — ONDANSETRON 8 MG/50ML IVPB (CHCC)
8.0000 mg | Freq: Once | INTRAVENOUS | Status: AC
  Administered 2013-06-19: 8 mg via INTRAVENOUS

## 2013-06-19 NOTE — Patient Instructions (Addendum)
[facility] Discharge Instructions for Patients Receiving Chemotherapy  Today you received the following chemotherapy agents: Taxol  To help prevent nausea and vomiting after your treatment, we encourage you to take your nausea medication as directed by your physician.    If you develop nausea and vomiting that is not controlled by your nausea medication, call the clinic.   BELOW ARE SYMPTOMS THAT SHOULD BE REPORTED IMMEDIATELY:  *FEVER GREATER THAN 100.5 F  *CHILLS WITH OR WITHOUT FEVER  NAUSEA AND VOMITING THAT IS NOT CONTROLLED WITH YOUR NAUSEA MEDICATION  *UNUSUAL SHORTNESS OF BREATH  *UNUSUAL BRUISING OR BLEEDING  TENDERNESS IN MOUTH AND THROAT WITH OR WITHOUT PRESENCE OF ULCERS  *URINARY PROBLEMS  *BOWEL PROBLEMS  UNUSUAL RASH Items with * indicate a potential emergency and should be followed up as soon as possible.  Feel free to call the clinic you have any questions or concerns. The clinic phone number is [phone].    

## 2013-06-20 ENCOUNTER — Ambulatory Visit: Payer: Medicare Other

## 2013-06-20 ENCOUNTER — Ambulatory Visit (HOSPITAL_BASED_OUTPATIENT_CLINIC_OR_DEPARTMENT_OTHER): Payer: Medicare Other

## 2013-06-20 VITALS — BP 148/62 | HR 77 | Temp 98.4°F

## 2013-06-20 DIAGNOSIS — Z5189 Encounter for other specified aftercare: Secondary | ICD-10-CM

## 2013-06-20 DIAGNOSIS — C569 Malignant neoplasm of unspecified ovary: Secondary | ICD-10-CM

## 2013-06-20 DIAGNOSIS — R971 Elevated cancer antigen 125 [CA 125]: Secondary | ICD-10-CM

## 2013-06-20 MED ORDER — SODIUM CHLORIDE 0.9 % IV SOLN
INTRAVENOUS | Status: DC
  Administered 2013-06-20: 11:00:00 via INTRAVENOUS

## 2013-06-20 MED ORDER — FILGRASTIM 300 MCG/0.5ML IJ SOLN
300.0000 ug | Freq: Once | INTRAMUSCULAR | Status: AC
  Administered 2013-06-20: 300 ug via SUBCUTANEOUS
  Filled 2013-06-20: qty 0.5

## 2013-06-20 NOTE — Patient Instructions (Addendum)
Dehydration, Adult  Dehydration means your body does not have as much fluid as it needs. Your kidneys, brain, and heart will not work properly without the right amount of fluids and salt.   HOME CARE   Ask your doctor how to replace body fluid losses (rehydrate).   Drink enough fluids to keep your pee (urine) clear or pale yellow.   Drink small amounts of fluids often if you feel sick to your stomach (nauseous) or throw up (vomit).   Eat like you normally do.   Avoid:   Foods or drinks high in sugar.   Bubbly (carbonated) drinks.   Juice.   Very hot or cold fluids.   Drinks with caffeine.   Fatty, greasy foods.   Alcohol.   Tobacco.   Eating too much.   Gelatin desserts.   Wash your hands to avoid spreading germs (bacteria, viruses).   Only take medicine as told by your doctor.   Keep all doctor visits as told.  GET HELP RIGHT AWAY IF:    You cannot drink something without throwing up.   You get worse even with treatment.   Your vomit has blood in it or looks greenish.   Your poop (stool) has blood in it or looks black and tarry.   You have not peed in 6 to 8 hours.   You pee a small amount of very dark pee.   You have a fever.   You pass out (faint).   You have belly (abdominal) pain that gets worse or stays in one spot (localizes).   You have a rash, stiff neck, or bad headache.   You get easily annoyed, sleepy, or are hard to wake up.   You feel weak, dizzy, or very thirsty.  MAKE SURE YOU:    Understand these instructions.   Will watch your condition.   Will get help right away if you are not doing well or get worse.  Document Released: 09/11/2009 Document Revised: 02/07/2012 Document Reviewed: 07/05/2011  Exit[employer] Patient Information 2014 [employer].

## 2013-06-26 ENCOUNTER — Other Ambulatory Visit (HOSPITAL_BASED_OUTPATIENT_CLINIC_OR_DEPARTMENT_OTHER): Payer: Medicare Other | Admitting: Lab

## 2013-06-26 ENCOUNTER — Ambulatory Visit: Payer: Medicare Other

## 2013-06-26 ENCOUNTER — Other Ambulatory Visit: Payer: Self-pay

## 2013-06-26 ENCOUNTER — Other Ambulatory Visit: Payer: Medicare Other | Admitting: Lab

## 2013-06-26 ENCOUNTER — Telehealth: Payer: Self-pay | Admitting: *Deleted

## 2013-06-26 DIAGNOSIS — C561 Malignant neoplasm of right ovary: Secondary | ICD-10-CM

## 2013-06-26 DIAGNOSIS — C569 Malignant neoplasm of unspecified ovary: Secondary | ICD-10-CM

## 2013-06-26 LAB — CBC WITH DIFFERENTIAL/PLATELET
BASO%: 0.5 % (ref 0.0–2.0)
EOS%: 0.5 % (ref 0.0–7.0)
MCH: 32.7 pg (ref 25.1–34.0)
MCHC: 34.4 g/dL (ref 31.5–36.0)
RDW: 15.3 % — ABNORMAL HIGH (ref 11.2–14.5)
lymph#: 0.6 10*3/uL — ABNORMAL LOW (ref 0.9–3.3)

## 2013-06-26 NOTE — Telephone Encounter (Signed)
Per chemo RN I have canceled appts for today and tomorrow. I have moved appts to Thrusday and Friday. JMW

## 2013-06-27 ENCOUNTER — Ambulatory Visit: Payer: Medicare Other

## 2013-06-28 ENCOUNTER — Other Ambulatory Visit: Payer: Medicare Other | Admitting: Lab

## 2013-06-28 ENCOUNTER — Encounter: Payer: Self-pay | Admitting: *Deleted

## 2013-06-28 ENCOUNTER — Ambulatory Visit: Payer: Medicare Other

## 2013-06-29 ENCOUNTER — Ambulatory Visit: Payer: Medicare Other

## 2013-07-03 ENCOUNTER — Ambulatory Visit: Payer: Medicare Other | Attending: Gynecology | Admitting: Gynecology

## 2013-07-03 ENCOUNTER — Encounter: Payer: Self-pay | Admitting: Gynecology

## 2013-07-03 ENCOUNTER — Telehealth: Payer: Self-pay | Admitting: *Deleted

## 2013-07-03 ENCOUNTER — Other Ambulatory Visit: Payer: Self-pay | Admitting: Hematology and Oncology

## 2013-07-03 ENCOUNTER — Other Ambulatory Visit (HOSPITAL_BASED_OUTPATIENT_CLINIC_OR_DEPARTMENT_OTHER): Payer: Medicare Other | Admitting: Lab

## 2013-07-03 ENCOUNTER — Ambulatory Visit (HOSPITAL_BASED_OUTPATIENT_CLINIC_OR_DEPARTMENT_OTHER): Payer: Medicare Other

## 2013-07-03 ENCOUNTER — Ambulatory Visit (HOSPITAL_BASED_OUTPATIENT_CLINIC_OR_DEPARTMENT_OTHER): Payer: Medicare Other | Admitting: Hematology and Oncology

## 2013-07-03 VITALS — BP 137/81 | HR 70 | Temp 98.1°F | Resp 20 | Ht 63.0 in | Wt 137.9 lb

## 2013-07-03 VITALS — BP 138/80 | HR 68 | Temp 97.8°F | Resp 16 | Ht 62.99 in | Wt 137.2 lb

## 2013-07-03 DIAGNOSIS — C569 Malignant neoplasm of unspecified ovary: Secondary | ICD-10-CM

## 2013-07-03 DIAGNOSIS — I10 Essential (primary) hypertension: Secondary | ICD-10-CM | POA: Insufficient documentation

## 2013-07-03 DIAGNOSIS — Z79899 Other long term (current) drug therapy: Secondary | ICD-10-CM | POA: Insufficient documentation

## 2013-07-03 DIAGNOSIS — E039 Hypothyroidism, unspecified: Secondary | ICD-10-CM | POA: Insufficient documentation

## 2013-07-03 DIAGNOSIS — Z5111 Encounter for antineoplastic chemotherapy: Secondary | ICD-10-CM

## 2013-07-03 DIAGNOSIS — Z90710 Acquired absence of both cervix and uterus: Secondary | ICD-10-CM | POA: Insufficient documentation

## 2013-07-03 DIAGNOSIS — E785 Hyperlipidemia, unspecified: Secondary | ICD-10-CM | POA: Insufficient documentation

## 2013-07-03 LAB — CBC WITH DIFFERENTIAL/PLATELET
BASO%: 0.3 % (ref 0.0–2.0)
Eosinophils Absolute: 0 10*3/uL (ref 0.0–0.5)
MCV: 98.5 fL (ref 79.5–101.0)
MONO#: 0.1 10*3/uL (ref 0.1–0.9)
MONO%: 1.6 % (ref 0.0–14.0)
NEUT#: 3 10*3/uL (ref 1.5–6.5)
RBC: 3.31 10*6/uL — ABNORMAL LOW (ref 3.70–5.45)
RDW: 16.4 % — ABNORMAL HIGH (ref 11.2–14.5)
WBC: 3.8 10*3/uL — ABNORMAL LOW (ref 3.9–10.3)
nRBC: 0 % (ref 0–0)

## 2013-07-03 LAB — COMPREHENSIVE METABOLIC PANEL (CC13)
ALT: 12 U/L (ref 0–55)
AST: 17 U/L (ref 5–34)
CO2: 24 mEq/L (ref 22–29)
Sodium: 138 mEq/L (ref 136–145)
Total Bilirubin: 0.65 mg/dL (ref 0.20–1.20)
Total Protein: 6.8 g/dL (ref 6.4–8.3)

## 2013-07-03 MED ORDER — HEPARIN SOD (PORK) LOCK FLUSH 100 UNIT/ML IV SOLN
500.0000 [IU] | Freq: Once | INTRAVENOUS | Status: AC | PRN
  Administered 2013-07-03: 500 [IU]
  Filled 2013-07-03: qty 5

## 2013-07-03 MED ORDER — DEXAMETHASONE SODIUM PHOSPHATE 20 MG/5ML IJ SOLN
20.0000 mg | Freq: Once | INTRAMUSCULAR | Status: AC
  Administered 2013-07-03: 20 mg via INTRAVENOUS

## 2013-07-03 MED ORDER — DIPHENHYDRAMINE HCL 50 MG/ML IJ SOLN
25.0000 mg | Freq: Once | INTRAMUSCULAR | Status: AC
  Administered 2013-07-03: 25 mg via INTRAVENOUS

## 2013-07-03 MED ORDER — FAMOTIDINE IN NACL 20-0.9 MG/50ML-% IV SOLN
20.0000 mg | Freq: Once | INTRAVENOUS | Status: AC
  Administered 2013-07-03: 20 mg via INTRAVENOUS

## 2013-07-03 MED ORDER — DEXTROSE 5 % IV SOLN
80.0000 mg/m2 | Freq: Once | INTRAVENOUS | Status: AC
  Administered 2013-07-03: 132 mg via INTRAVENOUS
  Filled 2013-07-03: qty 22

## 2013-07-03 MED ORDER — SODIUM CHLORIDE 0.9 % IV SOLN
Freq: Once | INTRAVENOUS | Status: AC
  Administered 2013-07-03: 13:00:00 via INTRAVENOUS

## 2013-07-03 MED ORDER — SODIUM CHLORIDE 0.9 % IJ SOLN
10.0000 mL | INTRAMUSCULAR | Status: DC | PRN
  Administered 2013-07-03: 10 mL
  Filled 2013-07-03: qty 10

## 2013-07-03 MED ORDER — ONDANSETRON 8 MG/50ML IVPB (CHCC)
8.0000 mg | Freq: Once | INTRAVENOUS | Status: AC
  Administered 2013-07-03: 8 mg via INTRAVENOUS

## 2013-07-03 NOTE — Telephone Encounter (Signed)
appts made and printed. Pt is aware that tx will be added at a later time. i emailed MB to add tx...td

## 2013-07-03 NOTE — Addendum Note (Signed)
Addended by: Warner Mccreedy D on: 07/03/2013 01:05 PM   Modules accepted: Orders

## 2013-07-03 NOTE — Progress Notes (Signed)
OFFICE PROGRESS NOTE   07/03/2013   Physicians:D. ClarkePearson; J.Russo, T.Fontaine, [name], [name]. Last gastroenterologist in [place].   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 She has had a difficult time with chemotherapy since this was begun 03-20-13, including hospitalizations after cycle 2 day 1 and again after cycle 2 day 8; she also needed gCSF added by end of cycle 1. She was hospitalized x2 during cycle 2, then day 15 cycle 2 held with problems requiring the hospitalization then her planned beach vacation. She resumed treatment with day 1 cycle 3 on 05-22-13, with neupogen on 6-25 and 05-24-13 and additional IVF on day 2. She was fatigued/ no energy and had some constipation since most recent treatment, but has felt a little better yesterday and today; she is due day 8 cycle 3 today and took premedication steroids last pm (decadron 20 mg 12 hrs prior). She does not have PAC, but would like to have one now as it frequently takes >1 attempt to start peripheral IV. We have been able to schedule this by IR on 06-04-13.   Oncologic History  She had ~ 2 weeks vaginal spotting in late Dec 2013, then presented to [name] in late Jan 2014 after bright red spotting that AM, with uterus larger than had been apparent on exam in Oct 2013. Sonohystogram 01-05-13 showed uterus normal size and echotexture, endometrium 4.3 mm, left ovary normal and right adnexa with 1.1 x 8.4.x8.2 cm cystic and solid mass. Endometrial biopsy benign and CA 125 also on 01-05-13 was 178.8. She had CT AP 01-17-13 with 1.0 x 6.9 x 8.9 cm complex right ovarian mass, no ascites, small retroperitoneal nodes. She was seen by [name] on 01-18-13  and taken to surgery by [name] on 02-13-13, which was TAH/BSO/ omentectomy/ureterolysis/ resection of cul-de-sac tumor/right pelvic lymphadnectomy and resection of rectum with reanastomosis. At completion of surgery there was no gross residual disease. 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 Washings [id number]) had rare clusters of atypical cells. Chemotherapy with dose dense taxol carboplatin was begun day 1 cycle 1 on 03-20-13; ANC was 1.1 on day 15 cycle 1 with taxol given and neupogen added 04-04-13. She had day 1 cycle 2 treatment on 04-17-13, then was briefly hospitalized 5-22 to 04-20-13 after syncopal episode, with antihypertensive agents DCd and UTI treated. She was feeling much better at time of "day 8" cycle 2 on 05-01-13 and did have neupogen 300 mcg x 1 dose on 05-02-13. She was readmitted to hospital 6-5 thru 05-05-13 with fever, empirically on antibiotics and blood cultures negative. She then went on planned beach vacation such that day 1 cycle 3 was administered on 05-22-13, with neupogen on 6-25 and 6-26 with IVF also on day 2.   She is doing better today, no vomiting but mild nausea, no fever or symptoms of infection including no bladder symptoms. She is trying to drink fluids.  No syncopal episodes. She has follow up with Dr Sharol Given today. Remainder of 10 point Review of Systems negative.  Objective:  Vital signs in  last 24 hours:  BP 137/81  Pulse 70  Temp(Src) 98.1 F (36.7 C) (Oral)  Resp 20  Ht 5\' 3"  (1.6 m)  Wt 137 lb 14.4 oz (62.551 kg)  BMI 24.43 kg/m2  Weight is stable. Easily mobile, wearing wig, NAD.  HEENT:PERRLA, sclera clear, anicteric and oropharynx clear, no lesions. ALopecia. Mucous membranes  moist LymphaticsCervical, supraclavicular, and axillary nodes normal. No inguinal adenopathy Resp: clear to auscultation bilaterally and normal percussion bilaterally Cardio: regular rate and rhythm GI: soft, non-tender; bowel sounds normal; no masses,  no organomegaly not distended. Surgical scar not remarkable Extremities: no edema or tenderness, no cords LE. Right forearm laterally with 3 cm nontender cord no erythema, at site of recent IV. Neuro:no sensory deficits noted Breast:normal without suspicious masses, skin or nipple changes or axillary nodes Skin no rash or ecchymosis  Lab Results:  Results for orders placed in visit on 07/03/13  CBC WITH DIFFERENTIAL      Result Value Range   WBC 3.8 (*) 3.9 - 10.3 10e3/uL   NEUT# 3.0  1.5 - 6.5 10e3/uL   HGB 10.9 (*) 11.6 - 15.9 g/dL   HCT 16.1 (*) 09.6 - 04.5 %   Platelets 223  145 - 400 10e3/uL   MCV 98.5  79.5 - 101.0 fL   MCH 32.9  25.1 - 34.0 pg   MCHC 33.4  31.5 - 36.0 g/dL   RBC 4.09 (*) 8.11 - 9.14 10e6/uL   RDW 16.4 (*) 11.2 - 14.5 %   lymph# 0.7 (*) 0.9 - 3.3 10e3/uL   MONO# 0.1  0.1 - 0.9 10e3/uL   Eosinophils Absolute 0.0  0.0 - 0.5 10e3/uL   Basophils Absolute 0.0  0.0 - 0.1 10e3/uL   NEUT% 80.3 (*) 38.4 - 76.8 %   LYMPH% 17.8  14.0 - 49.7 %   MONO% 1.6  0.0 - 14.0 %   EOS% 0.0  0.0 - 7.0 %   BASO% 0.3  0.0 - 2.0 %   nRBC 0  0 - 0 %    Lab Results  Component Value Date   GLUCOSE 138 06/19/2013   BUN 13.4 06/19/2013   CO2 26 06/19/2013   ALT 25 06/12/2013   AST 17 06/12/2013   K 4.0 06/19/2013   CREATININE 0.6 06/19/2013   Last CMET 6-23 noted, normal with exception of T prot 6.3 Last CA 125 17.7 on 05-21-13, this down from preop 178  Studies/Results:  No results found.  Medications: I have reviewed the patient's current medications. She will hold glucosamine as arthritis is not as bothersome since she is on weekly decadron for the taxol. She will hold K+ as last values good and she is off HCTZ, and chemistries  will be followed for this.   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 Cycle 2 delayed one week with neutropenia, then short hospitalizations after cycle 2 day 1 and again after day 8. Day 15 cycle 2 omitted due to beach vacation. Cycle 3 day 1 given 05-22-13 with neupogen 6-25 and 6-26 as well as additional IVF. She should have neupogen days 2,3,8,16 each cycle and IVF on days 2, 9, 16 each cycle, with total 6 cycles planned.   -She received her cycle 4 day 1 on 06/12/2013 and was supposed to receive her Day 15 on 27/9 but  her Rx was held due to neutropenia. Will give Day 15 of cycle 4 today 07/03/2013 then will come tomorrow for IV fluids and Neupogen.    Plan to start cycle #5 -D1   on 8/12  -D8   on 8/19 -D15 on 8/26 IV fluids and Neupogen on the D2,3,9,16  Then C6 D1 on 9/2. Note patient is reluctant to have the extra IVF, however agrees that we need to be proactive due to previous hospitalizations. Dr Wanita Chamberlain last note mentions follow up with him after 4 cycles; she will also need restaging CT AP and visit back to gyn oncology a few weeks after completion of chemotherapy.  2.HTN, elevated lipids, one episode of paroxysmal Afib. On pradaxa and ASA. HCTZ and losartan held since 04-09-13 and hydralazine now prn. Dr Ferd Hibbs help much appreciated.  3.hypothyroidism on replacement  4.osteoarthritis, post right hip replacement 2013  5. Previous enterobacter UTI 6. Peripheral IV access difficult for patient, PAC to be placed by IR on 06-04-13. EMLA script to pharmacy.    Zachery Dakins, MD   07/03/2013, 10:56 AM

## 2013-07-03 NOTE — Patient Instructions (Signed)
Return to see me on October 31 for followup.  Prior to that visit we will obtain a CT scan of the chest abdomen and pelvis

## 2013-07-03 NOTE — Progress Notes (Signed)
Consult Note: Gyn-Onc   [name] 72 y.o. female  Chief Complaint  Patient presents with  . Ovarian Cancer    Follow up    Assessment : Stage IIb poorly differentiated ovarian cancer. She sees be tolerating the chemotherapy regimen reasonably well and has had a nice response based on file in CA 125 values.  Plan:  She'll continue to receive chemotherapy and is on she stays scheduled to complete os of 03/14/2013. We'll plan a CT scan in mid to late October at our plan on seeing the patient back in followup on 09/28/2013. We will continue to monitor CA 125 values at the initiation of each cycle of chemotherapy.   Interval History:  The patient returns today for continuing followup now having received 4 cycles of carboplatin and Taxol in the dose dense regimen. She sees be tolerating therapy reasonably well although she does have some difficulty with constipation and fatigue. She denies any neuropathy or any other GI or GU symptoms. It's noted that her CA 125 value most recently was 15 (preoperatively was 178 units per mL).  HPI:The patient initially presented with a pelvic mass and elevated CA 125 (178 units per mL and (she underwent exploratory laparotomy and debulking on 02/13/2013. Final pathology showed a poorly differentiated ovarian cancer involving both ovaries pelvic peritoneum and rectal muscularis. All gross disease was resected.   Review of Systems:10 point review of systems is negative except as noted in interval history.   Vitals: Blood pressure 138/80, pulse 68, temperature 97.8 F (36.6 C), temperature source Oral, resp. rate 16, height 5' 2.99" (1.6 m), weight 137 lb 3.2 oz (62.234 kg).  Physical Exam: General : The patient is a healthy woman in no acute distress.  HEENT: normocephalic, extraoccular movements normal; neck is supple without thyromegally  Lynphnodes: Supraclavicular and inguinal nodes not enlarged  Abdomen: Soft, non-tender, no ascites, no  organomegally, no masses, no hernias , midline incision is healing well  Pelvic:    EGBUS: Normal female  Vagina bladder urethra: Normal  Vaginal cuff is well healed.  Cervix and uterus are surgically absent  Bimanual exam: No masses nodularity or fullness.  Rectovaginal exam confirms      Lower extremities: No edema or varicosities. Normal range of motion      Allergies  Allergen Reactions  . Codeine Other (See Comments)    Does not like the feeling she gets  . Lisinopril     LIP NUMBNESS    Past Medical History  Diagnosis Date  . Hypertension   . Dyslipidemia   . Hypothyroidism   . Arthritis     OSTEOARTHRITIS   -- CONSTANT PAIN RIGHT HIP---AND PAIN LEFT KNEE--PT STATES SHE GETS INJECTIONS INTO HER KNEE  . Dysrhythmia     HX OF PAROXYSMAL ATRIAL FIB  . Complication of anesthesia     BLOOD PRESSURE DROPPED WITH NASAL SURGERY, ONE OF THE CARPAL TUNNEL REPAIRS AND DURING A COLONOSCOPY  . Cancer     Past Surgical History  Procedure Laterality Date  . Bilateral carpal tunnel repair  2007  . Dilation and curettage of uterus  1969  . Surgery for ruptured ovarian cyst  1969  . Rhinoplasty for fractured nose  1986  . Left knee arthroscopy   2011  . Total hip arthroplasty  02/25/2012    Procedure: TOTAL HIP ARTHROPLASTY ANTERIOR APPROACH;  Surgeon: Kathryne Hitch, MD;  Location: [facility];  Service: Orthopedics;  Laterality: Right;  . Tonsillectomy  1962  .  Laparotomy Bilateral 02/13/2013    Procedure: EXPLORATORY LAPAROTOMY TOTAL ABDOMINAL HYSTERECTOMY BILATERAL SALPINGO-OOPHORECTOMY, Partial Rectal Resection with Reanastamosis;  Surgeon: Jeannette Corpus, MD;  Location: [facility];  Service: Gynecology;  Laterality: Bilateral;  . Omentectomy  02/13/2013    Procedure: OMENTECTOMY;  Surgeon: Jeannette Corpus, MD;  Location: [facility];  Service: Gynecology;;  . Lymphadenectomy Right 02/13/2013    Procedure: PEVLIC  LYMPHADENECTOMY, DEBULKING right pelvic  tumor nodules;  Surgeon: Jeannette Corpus, MD;  Location: [facility];  Service: Gynecology;  Laterality: Right;    Current Outpatient Prescriptions  Medication Sig Dispense Refill  . acetaminophen (TYLENOL) 325 MG tablet Take 2 tablets (650 mg total) by mouth every 6 (six) hours as needed.      Marland Kitchen aspirin EC 81 MG tablet Take 81 mg by mouth daily.      . bisacodyl (DULCOLAX) 5 MG EC tablet Take 1 tablet (5 mg total) by mouth daily as needed.  30 tablet  0  . bisoprolol (ZEBETA) 5 MG tablet Take 5 mg by mouth 2 (two) times daily.      Marland Kitchen dexamethasone (DECADRON) 4 MG tablet Take 5 tabs 12 hours prior to Taxol or as directed.  35 tablet  0  . docusate sodium (COLACE) 100 MG capsule Take 100 mg by mouth 2 (two) times daily.      Marland Kitchen estradiol (ESTRACE) 1 MG tablet Take 1 tablet (1 mg total) by mouth every morning. PT STATES SHE WANTS ESTRACE--NOT THE GENERIC  90 tablet  3  . Glucosamine HCl (GLUCOSAMINE PO) Take 1,000 mg by mouth daily.      . hydrALAZINE (APRESOLINE) 25 MG tablet 2 times daily only if SBP > 155      . levothyroxine (SYNTHROID, LEVOTHROID) 88 MCG tablet Take 88 mcg by mouth every morning. PT STATES SHE NEEDS THE SYNTHROID--DOES NOT WANT THE GENERIC      . lidocaine-prilocaine (EMLA) cream Apply topically as needed. Apply 1-2 hours prior to [facility] access as needed.  30 g  2  . LORazepam (ATIVAN) 1 MG tablet Take 1 mg by mouth every 8 (eight) hours as needed for anxiety.      . Multiple Vitamin (MULITIVITAMIN WITH MINERALS) TABS Take 1 tablet by mouth every morning.      . ondansetron (ZOFRAN) 8 MG tablet Take 1-2 tablets (8-16 mg total) by mouth every 12 (twelve) hours as needed for nausea.  20 tablet  1  . polyethylene glycol (MIRALAX / GLYCOLAX) packet Take 17 g by mouth daily.      . potassium chloride (KLOR-CON 10) 10 MEQ tablet Take 1 tablet (10 mEq total) by mouth daily.      . propafenone (RYTHMOL) 225 MG tablet Take 225 mg by mouth 2 (two) times daily. 9am9pm      . senna  (SENOKOT) 8.6 MG tablet Take 1 tablet by mouth 2 (two) times daily as needed for constipation.       No current facility-administered medications for this visit.    History   Social History  . Marital Status: Married    Spouse Name: N/A    Number of Children: N/A  . Years of Education: N/A   Occupational History  . Not on file.   Social History Main Topics  . Smoking status: Never Smoker   . Smokeless tobacco: Never Used  . Alcohol Use: No  . Drug Use: No  . Sexually Active: Not Currently   Other Topics Concern  . Not on  file   Social History Narrative  . No narrative on file    Family History  Problem Relation Age of Onset  . Hypertension Mother       Jeannette Corpus, MD 07/03/2013, 12:21 PM

## 2013-07-03 NOTE — Patient Instructions (Addendum)
[facility] Discharge Instructions for Patients Receiving Chemotherapy  Today you received the following chemotherapy agents: Taxol.  To help prevent nausea and vomiting after your treatment, we encourage you to take your nausea medication as prescribed.   If you develop nausea and vomiting that is not controlled by your nausea medication, call the clinic.   BELOW ARE SYMPTOMS THAT SHOULD BE REPORTED IMMEDIATELY:  *FEVER GREATER THAN 100.5 F  *CHILLS WITH OR WITHOUT FEVER  NAUSEA AND VOMITING THAT IS NOT CONTROLLED WITH YOUR NAUSEA MEDICATION  *UNUSUAL SHORTNESS OF BREATH  *UNUSUAL BRUISING OR BLEEDING  TENDERNESS IN MOUTH AND THROAT WITH OR WITHOUT PRESENCE OF ULCERS  *URINARY PROBLEMS  *BOWEL PROBLEMS  UNUSUAL RASH Items with * indicate a potential emergency and should be followed up as soon as possible.  Feel free to call the clinic you have any questions or concerns. The clinic phone number is [phone].    

## 2013-07-04 ENCOUNTER — Other Ambulatory Visit: Payer: Self-pay

## 2013-07-04 ENCOUNTER — Telehealth: Payer: Self-pay | Admitting: *Deleted

## 2013-07-04 ENCOUNTER — Ambulatory Visit (HOSPITAL_BASED_OUTPATIENT_CLINIC_OR_DEPARTMENT_OTHER): Payer: Medicare Other

## 2013-07-04 VITALS — BP 129/65 | HR 67

## 2013-07-04 DIAGNOSIS — Z5189 Encounter for other specified aftercare: Secondary | ICD-10-CM

## 2013-07-04 DIAGNOSIS — C569 Malignant neoplasm of unspecified ovary: Secondary | ICD-10-CM

## 2013-07-04 DIAGNOSIS — R971 Elevated cancer antigen 125 [CA 125]: Secondary | ICD-10-CM

## 2013-07-04 MED ORDER — FILGRASTIM 300 MCG/0.5ML IJ SOLN
300.0000 ug | Freq: Once | INTRAMUSCULAR | Status: AC
  Administered 2013-07-04: 300 ug via SUBCUTANEOUS
  Filled 2013-07-04: qty 0.5

## 2013-07-04 MED ORDER — SODIUM CHLORIDE 0.9 % IV SOLN
INTRAVENOUS | Status: DC
  Administered 2013-07-04: 14:00:00 via INTRAVENOUS

## 2013-07-04 NOTE — Telephone Encounter (Signed)
sw pt informed her that her lab appt change to 1:15pm, ov @ 1:45pm and chemo to follow immediately after seeing Adrena. Pt is aware that i am working on getting the rest of her appts scheduled, and plans on giving her a call sometime tomorrow. i have emailed MB to add the tx, and will go over the schedule w/MB Thursday am...td

## 2013-07-04 NOTE — Patient Instructions (Signed)
Dehydration, Adult Dehydration is when you lose more fluids from the body than you take in. Vital organs like the kidneys, brain, and heart cannot function without a proper amount of fluids and salt. Any loss of fluids from the body can cause dehydration.  CAUSES   Vomiting.  Diarrhea.  Excessive sweating.  Excessive urine output.  Fever. SYMPTOMS  Mild dehydration  Thirst.  Dry lips.  Slightly dry mouth. Moderate dehydration  Very dry mouth.  Sunken eyes.  Skin does not bounce back quickly when lightly pinched and released.  Dark urine and decreased urine production.  Decreased tear production.  Headache. Severe dehydration  Very dry mouth.  Extreme thirst.  Rapid, weak pulse (more than 100 beats per minute at rest).  Cold hands and feet.  Not able to sweat in spite of heat and temperature.  Rapid breathing.  Blue lips.  Confusion and lethargy.  Difficulty being awakened.  Minimal urine production.  No tears. DIAGNOSIS  Your caregiver will diagnose dehydration based on your symptoms and your exam. Blood and urine tests will help confirm the diagnosis. The diagnostic evaluation should also identify the cause of dehydration. TREATMENT  Treatment of mild or moderate dehydration can often be done at home by increasing the amount of fluids that you drink. It is best to drink small amounts of fluid more often. Drinking too much at one time can make vomiting worse. Refer to the home care instructions below. Severe dehydration needs to be treated at the hospital where you will probably be given intravenous (IV) fluids that contain water and electrolytes. HOME CARE INSTRUCTIONS   Ask your caregiver about specific rehydration instructions.  Drink enough fluids to keep your urine clear or pale yellow.  Drink small amounts frequently if you have nausea and vomiting.  Eat as you normally do.  Avoid:  Foods or drinks high in sugar.  Carbonated  drinks.  Juice.  Extremely hot or cold fluids.  Drinks with caffeine.  Fatty, greasy foods.  Alcohol.  Tobacco.  Overeating.  Gelatin desserts.  Wash your hands well to avoid spreading bacteria and viruses.  Only take over-the-counter or prescription medicines for pain, discomfort, or fever as directed by your caregiver.  Ask your caregiver if you should continue all prescribed and over-the-counter medicines.  Keep all follow-up appointments with your caregiver. SEEK MEDICAL CARE IF:  You have abdominal pain and it increases or stays in one area (localizes).  You have a rash, stiff neck, or severe headache.  You are irritable, sleepy, or difficult to awaken.  You are weak, dizzy, or extremely thirsty. SEEK IMMEDIATE MEDICAL CARE IF:   You are unable to keep fluids down or you get worse despite treatment.  You have frequent episodes of vomiting or diarrhea.  You have blood or green matter (bile) in your vomit.  You have blood in your stool or your stool looks black and tarry.  You have not urinated in 6 to 8 hours, or you have only urinated a small amount of very dark urine.  You have a fever.  You faint. MAKE SURE YOU:   Understand these instructions.  Will watch your condition.  Will get help right away if you are not doing well or get worse. Document Released: 11/15/2005 Document Revised: 02/07/2012 Document Reviewed: 07/05/2011 [employer] Patient Information 2014 [employer].  

## 2013-07-05 ENCOUNTER — Telehealth: Payer: Self-pay | Admitting: *Deleted

## 2013-07-05 NOTE — Telephone Encounter (Signed)
Per MB and Louise the pt is to only have fluids on 8/13 and inj on 8/13 and 14th....td

## 2013-07-09 ENCOUNTER — Other Ambulatory Visit: Payer: Self-pay | Admitting: Medical Oncology

## 2013-07-09 DIAGNOSIS — C569 Malignant neoplasm of unspecified ovary: Secondary | ICD-10-CM

## 2013-07-10 ENCOUNTER — Ambulatory Visit (HOSPITAL_BASED_OUTPATIENT_CLINIC_OR_DEPARTMENT_OTHER): Payer: Medicare Other

## 2013-07-10 ENCOUNTER — Other Ambulatory Visit: Payer: Medicare Other | Admitting: Lab

## 2013-07-10 ENCOUNTER — Ambulatory Visit (HOSPITAL_BASED_OUTPATIENT_CLINIC_OR_DEPARTMENT_OTHER): Payer: Medicare Other | Admitting: Physician Assistant

## 2013-07-10 ENCOUNTER — Other Ambulatory Visit: Payer: Self-pay | Admitting: Hematology and Oncology

## 2013-07-10 ENCOUNTER — Ambulatory Visit (HOSPITAL_BASED_OUTPATIENT_CLINIC_OR_DEPARTMENT_OTHER): Payer: Medicare Other | Admitting: Lab

## 2013-07-10 VITALS — BP 145/72 | HR 70 | Temp 97.8°F | Resp 19 | Ht 62.0 in | Wt 140.2 lb

## 2013-07-10 DIAGNOSIS — C569 Malignant neoplasm of unspecified ovary: Secondary | ICD-10-CM

## 2013-07-10 DIAGNOSIS — D709 Neutropenia, unspecified: Secondary | ICD-10-CM

## 2013-07-10 DIAGNOSIS — R209 Unspecified disturbances of skin sensation: Secondary | ICD-10-CM

## 2013-07-10 DIAGNOSIS — R110 Nausea: Secondary | ICD-10-CM

## 2013-07-10 DIAGNOSIS — Z5111 Encounter for antineoplastic chemotherapy: Secondary | ICD-10-CM

## 2013-07-10 LAB — CBC WITH DIFFERENTIAL/PLATELET
Basophils Absolute: 0 10*3/uL (ref 0.0–0.1)
Eosinophils Absolute: 0 10*3/uL (ref 0.0–0.5)
HCT: 31.9 % — ABNORMAL LOW (ref 34.8–46.6)
LYMPH%: 17.7 % (ref 14.0–49.7)
MCV: 97.3 fL (ref 79.5–101.0)
MONO#: 0.2 10*3/uL (ref 0.1–0.9)
NEUT#: 4.7 10*3/uL (ref 1.5–6.5)
NEUT%: 78.5 % — ABNORMAL HIGH (ref 38.4–76.8)
Platelets: 192 10*3/uL (ref 145–400)
WBC: 6 10*3/uL (ref 3.9–10.3)

## 2013-07-10 LAB — COMPREHENSIVE METABOLIC PANEL (CC13)
ALT: 12 U/L (ref 0–55)
BUN: 11.7 mg/dL (ref 7.0–26.0)
CO2: 26 mEq/L (ref 22–29)
Calcium: 9.2 mg/dL (ref 8.4–10.4)
Chloride: 103 mEq/L (ref 98–109)
Creatinine: 0.7 mg/dL (ref 0.6–1.1)
Glucose: 156 mg/dl — ABNORMAL HIGH (ref 70–140)

## 2013-07-10 MED ORDER — DEXAMETHASONE SODIUM PHOSPHATE 20 MG/5ML IJ SOLN
20.0000 mg | Freq: Once | INTRAMUSCULAR | Status: AC
  Administered 2013-07-10: 20 mg via INTRAVENOUS

## 2013-07-10 MED ORDER — FAMOTIDINE IN NACL 20-0.9 MG/50ML-% IV SOLN
20.0000 mg | Freq: Once | INTRAVENOUS | Status: AC
  Administered 2013-07-10: 20 mg via INTRAVENOUS

## 2013-07-10 MED ORDER — SODIUM CHLORIDE 0.9 % IV SOLN: Freq: Once | INTRAVENOUS | Status: DC

## 2013-07-10 MED ORDER — DIPHENHYDRAMINE HCL 50 MG/ML IJ SOLN
25.0000 mg | Freq: Once | INTRAMUSCULAR | Status: AC
  Administered 2013-07-10: 25 mg via INTRAVENOUS

## 2013-07-10 MED ORDER — SODIUM CHLORIDE 0.9 % IV SOLN
500.0000 mg | Freq: Once | INTRAVENOUS | Status: AC
  Administered 2013-07-10: 500 mg via INTRAVENOUS
  Filled 2013-07-10: qty 50

## 2013-07-10 MED ORDER — PACLITAXEL CHEMO INJECTION 300 MG/50ML
80.0000 mg/m2 | Freq: Once | INTRAVENOUS | Status: AC
  Administered 2013-07-10: 132 mg via INTRAVENOUS
  Filled 2013-07-10: qty 22

## 2013-07-10 MED ORDER — ONDANSETRON 16 MG/50ML IVPB (CHCC)
16.0000 mg | Freq: Once | INTRAVENOUS | Status: AC
  Administered 2013-07-10: 16 mg via INTRAVENOUS

## 2013-07-10 MED ORDER — HEPARIN SOD (PORK) LOCK FLUSH 100 UNIT/ML IV SOLN
500.0000 [IU] | Freq: Once | INTRAVENOUS | Status: AC | PRN
  Administered 2013-07-10: 500 [IU]
  Filled 2013-07-10: qty 5

## 2013-07-10 MED ORDER — SODIUM CHLORIDE 0.9 % IJ SOLN
10.0000 mL | INTRAMUSCULAR | Status: DC | PRN
  Administered 2013-07-10: 10 mL
  Filled 2013-07-10: qty 10

## 2013-07-10 NOTE — Patient Instructions (Addendum)
Continue weekly labs, chemotherapy and IV Fluids as scheduled Follow up in 3 weeks prior to the start of cycle #6

## 2013-07-11 ENCOUNTER — Telehealth: Payer: Self-pay | Admitting: *Deleted

## 2013-07-11 ENCOUNTER — Ambulatory Visit (HOSPITAL_BASED_OUTPATIENT_CLINIC_OR_DEPARTMENT_OTHER): Payer: Medicare Other

## 2013-07-11 ENCOUNTER — Telehealth: Payer: Self-pay | Admitting: Hematology and Oncology

## 2013-07-11 ENCOUNTER — Ambulatory Visit: Payer: Medicare Other

## 2013-07-11 VITALS — BP 127/63 | HR 70 | Temp 98.4°F | Resp 20

## 2013-07-11 DIAGNOSIS — C569 Malignant neoplasm of unspecified ovary: Secondary | ICD-10-CM

## 2013-07-11 LAB — CA 125: CA 125: 16.4 U/mL (ref 0.0–30.2)

## 2013-07-11 MED ORDER — FILGRASTIM 300 MCG/0.5ML IJ SOLN: 300.0000 ug | Freq: Once | INTRAMUSCULAR | Status: DC

## 2013-07-11 MED ORDER — SODIUM CHLORIDE 0.9 % IV SOLN
INTRAVENOUS | Status: DC
  Administered 2013-07-11: 11:00:00 via INTRAVENOUS

## 2013-07-11 MED ORDER — SODIUM CHLORIDE 0.9 % IJ SOLN
10.0000 mL | INTRAMUSCULAR | Status: DC | PRN
  Administered 2013-07-11: 10 mL via INTRAVENOUS
  Filled 2013-07-11: qty 10

## 2013-07-11 MED ORDER — FILGRASTIM 300 MCG/0.5ML IJ SOLN
300.0000 ug | Freq: Once | INTRAMUSCULAR | Status: AC
  Administered 2013-07-11: 300 ug via SUBCUTANEOUS
  Filled 2013-07-11: qty 0.5

## 2013-07-11 MED ORDER — HEPARIN SOD (PORK) LOCK FLUSH 100 UNIT/ML IV SOLN
500.0000 [IU] | Freq: Once | INTRAVENOUS | Status: AC
  Administered 2013-07-11: 500 [IU] via INTRAVENOUS
  Filled 2013-07-11: qty 5

## 2013-07-11 MED ORDER — SODIUM CHLORIDE 0.9 % IV SOLN: INTRAVENOUS | Status: DC

## 2013-07-11 MED ORDER — FILGRASTIM 300 MCG/0.5ML IJ SOLN
300.0000 ug | Freq: Once | INTRAMUSCULAR | Status: DC
  Filled 2013-07-11: qty 0.5

## 2013-07-11 MED ORDER — SODIUM CHLORIDE 0.9 % IV SOLN: Freq: Once | INTRAVENOUS | Status: DC

## 2013-07-11 NOTE — Patient Instructions (Signed)
Dehydration, Adult Dehydration is when you lose more fluids from the body than you take in. Vital organs like the kidneys, brain, and heart cannot function without a proper amount of fluids and salt. Any loss of fluids from the body can cause dehydration.  CAUSES   Vomiting.  Diarrhea.  Excessive sweating.  Excessive urine output.  Fever. SYMPTOMS  Mild dehydration  Thirst.  Dry lips.  Slightly dry mouth. Moderate dehydration  Very dry mouth.  Sunken eyes.  Skin does not bounce back quickly when lightly pinched and released.  Dark urine and decreased urine production.  Decreased tear production.  Headache. Severe dehydration  Very dry mouth.  Extreme thirst.  Rapid, weak pulse (more than 100 beats per minute at rest).  Cold hands and feet.  Not able to sweat in spite of heat and temperature.  Rapid breathing.  Blue lips.  Confusion and lethargy.  Difficulty being awakened.  Minimal urine production.  No tears. DIAGNOSIS  Your caregiver will diagnose dehydration based on your symptoms and your exam. Blood and urine tests will help confirm the diagnosis. The diagnostic evaluation should also identify the cause of dehydration. TREATMENT  Treatment of mild or moderate dehydration can often be done at home by increasing the amount of fluids that you drink. It is best to drink small amounts of fluid more often. Drinking too much at one time can make vomiting worse. Refer to the home care instructions below. Severe dehydration needs to be treated at the hospital where you will probably be given intravenous (IV) fluids that contain water and electrolytes. HOME CARE INSTRUCTIONS   Ask your caregiver about specific rehydration instructions.  Drink enough fluids to keep your urine clear or pale yellow.  Drink small amounts frequently if you have nausea and vomiting.  Eat as you normally do.  Avoid:  Foods or drinks high in sugar.  Carbonated  drinks.  Juice.  Extremely hot or cold fluids.  Drinks with caffeine.  Fatty, greasy foods.  Alcohol.  Tobacco.  Overeating.  Gelatin desserts.  Wash your hands well to avoid spreading bacteria and viruses.  Only take over-the-counter or prescription medicines for pain, discomfort, or fever as directed by your caregiver.  Ask your caregiver if you should continue all prescribed and over-the-counter medicines.  Keep all follow-up appointments with your caregiver. SEEK MEDICAL CARE IF:  You have abdominal pain and it increases or stays in one area (localizes).  You have a rash, stiff neck, or severe headache.  You are irritable, sleepy, or difficult to awaken.  You are weak, dizzy, or extremely thirsty. SEEK IMMEDIATE MEDICAL CARE IF:   You are unable to keep fluids down or you get worse despite treatment.  You have frequent episodes of vomiting or diarrhea.  You have blood or green matter (bile) in your vomit.  You have blood in your stool or your stool looks black and tarry.  You have not urinated in 6 to 8 hours, or you have only urinated a small amount of very dark urine.  You have a fever.  You faint. MAKE SURE YOU:   Understand these instructions.  Will watch your condition.  Will get help right away if you are not doing well or get worse. Document Released: 11/15/2005 Document Revised: 02/07/2012 Document Reviewed: 07/05/2011 [employer] Patient Information 2014 [employer].  

## 2013-07-11 NOTE — Telephone Encounter (Signed)
s.w. pt and advised on new sched...pt ok and aware and wanted to come by and pick up sched today.Marland KitchenMarland KitchenMarland KitchenDone

## 2013-07-11 NOTE — Telephone Encounter (Signed)
Per staff message and POF I have scheduled appts.  JMW  

## 2013-07-12 ENCOUNTER — Ambulatory Visit (HOSPITAL_BASED_OUTPATIENT_CLINIC_OR_DEPARTMENT_OTHER): Payer: Medicare Other

## 2013-07-12 ENCOUNTER — Ambulatory Visit: Payer: Medicare Other

## 2013-07-12 VITALS — BP 109/64 | HR 62 | Temp 97.6°F | Resp 18

## 2013-07-12 DIAGNOSIS — R971 Elevated cancer antigen 125 [CA 125]: Secondary | ICD-10-CM

## 2013-07-12 DIAGNOSIS — C569 Malignant neoplasm of unspecified ovary: Secondary | ICD-10-CM

## 2013-07-12 MED ORDER — HEPARIN SOD (PORK) LOCK FLUSH 100 UNIT/ML IV SOLN
500.0000 [IU] | Freq: Once | INTRAVENOUS | Status: AC
  Administered 2013-07-12: 500 [IU] via INTRAVENOUS
  Filled 2013-07-12: qty 5

## 2013-07-12 MED ORDER — SODIUM CHLORIDE 0.9 % IJ SOLN
10.0000 mL | INTRAMUSCULAR | Status: DC | PRN
  Administered 2013-07-12: 10 mL via INTRAVENOUS
  Filled 2013-07-12: qty 10

## 2013-07-12 MED ORDER — FILGRASTIM 300 MCG/0.5ML IJ SOLN
300.0000 ug | Freq: Once | INTRAMUSCULAR | Status: AC
  Administered 2013-07-12: 300 ug via SUBCUTANEOUS
  Filled 2013-07-12: qty 0.5

## 2013-07-12 MED ORDER — SODIUM CHLORIDE 0.9 % IV SOLN
INTRAVENOUS | Status: DC
  Administered 2013-07-12: 11:00:00 via INTRAVENOUS

## 2013-07-12 NOTE — Patient Instructions (Signed)
Dehydration, Adult  Dehydration means your body does not have as much fluid as it needs. Your kidneys, brain, and heart will not work properly without the right amount of fluids and salt.   HOME CARE   Ask your doctor how to replace body fluid losses (rehydrate).   Drink enough fluids to keep your pee (urine) clear or pale yellow.   Drink small amounts of fluids often if you feel sick to your stomach (nauseous) or throw up (vomit).   Eat like you normally do.   Avoid:   Foods or drinks high in sugar.   Bubbly (carbonated) drinks.   Juice.   Very hot or cold fluids.   Drinks with caffeine.   Fatty, greasy foods.   Alcohol.   Tobacco.   Eating too much.   Gelatin desserts.   Wash your hands to avoid spreading germs (bacteria, viruses).   Only take medicine as told by your doctor.   Keep all doctor visits as told.  GET HELP RIGHT AWAY IF:    You cannot drink something without throwing up.   You get worse even with treatment.   Your vomit has blood in it or looks greenish.   Your poop (stool) has blood in it or looks black and tarry.   You have not peed in 6 to 8 hours.   You pee a small amount of very dark pee.   You have a fever.   You pass out (faint).   You have belly (abdominal) pain that gets worse or stays in one spot (localizes).   You have a rash, stiff neck, or bad headache.   You get easily annoyed, sleepy, or are hard to wake up.   You feel weak, dizzy, or very thirsty.  MAKE SURE YOU:    Understand these instructions.   Will watch your condition.   Will get help right away if you are not doing well or get worse.  Document Released: 09/11/2009 Document Revised: 02/07/2012 Document Reviewed: 07/05/2011  [employer] Patient Information 2014 [employer].

## 2013-07-13 ENCOUNTER — Telehealth: Payer: Self-pay | Admitting: Oncology

## 2013-07-13 NOTE — Progress Notes (Signed)
OFFICE PROGRESS NOTE   07/10/2013  Physicians:D. ClarkePearson; J.Russo, T.Fontaine, [name], [name]. Last gastroenterologist in [place].   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 She has had a difficult time with chemotherapy since this was begun 03-20-13, including hospitalizations after cycle 2 day 1 and again after cycle 2 day 8; she also needed gCSF added by end of cycle 1. She was hospitalized x2 during cycle 2, then day 15 cycle 2 held with problems requiring the hospitalization then her planned beach vacation. She resumed treatment with day 1 cycle 3 on 05-22-13, with neupogen on 6-25 and 05-24-13 and additional IVF on day 2. She was fatigued/ no energy and had some constipation since most recent treatment. She took premedication steroids last pm (decadron 20 mg 12 hrs prior). She does have PAC, placed 06/04/2013 due to poor peripheral access.    Oncologic History  She had ~ 2 weeks vaginal spotting in late Dec 2013, then presented to [name] in late Jan 2014 after bright red spotting that AM, with uterus larger than had been apparent on exam in Oct 2013. Sonohystogram 01-05-13 showed uterus normal size and echotexture, endometrium 4.3 mm, left ovary normal and right adnexa with 1.1 x 8.4.x8.2 cm cystic and solid mass. Endometrial biopsy benign and CA 125 also on 01-05-13 was 178.8. She had CT AP 01-17-13 with 1.0 x 6.9 x 8.9 cm complex right ovarian mass, no ascites, small retroperitoneal nodes. She was seen by [name] on 01-18-13 and taken to surgery by [name] on 02-13-13, which was TAH/BSO/ omentectomy/ureterolysis/ resection of cul-de-sac tumor/right pelvic lymphadnectomy  and resection of rectum with reanastomosis. At completion of surgery there was no gross residual disease. 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 Wash[facility] [phone]) had rare clusters of atypical cells. Chemotherapy with dose dense taxol carboplatin was begun day 1 cycle 1 on 03-20-13; ANC was 1.1 on day 15 cycle 1 with taxol given and neupogen added 04-04-13. She had day 1 cycle 2 treatment on 04-17-13, then was briefly hospitalized 5-22 to 04-20-13 after syncopal episode, with antihypertensive agents DCd and UTI treated. She was feeling much better at time of "day 8" cycle 2 on 05-01-13 and did have neupogen 300 mcg x 1 dose on 05-02-13. She was readmitted to hospital 6-5 thru 05-05-13 with fever, empirically on antibiotics and blood cultures negative. She then went on planned beach vacation such that day 1 cycle 3 was administered on 05-22-13, with neupogen on 6-25 and 6-26 with IVF also on day 2.   Today she reports that she had a rough week complaining of having no energy at all. She had one episode of fever with MAXIMUM TEMPERATURE of 100.6 however this defervesced Tylenol, occurred last Thursday she's had no further fevers. She reports some numbness affecting the left big toe and second toe but no symptoms in the right foot or in her hands. She complains of having heartburn symptoms and is currently using a locks once daily after supper was reasonable control. She's also had some problems with constipation.  She notes some decrease in her visual acuity but also states that she is due for her yearly ophthalmology exam. She continues to have no vomiting but mild nausea, no fever other than low-grade mentioned above, or symptoms of infection including no bladder  symptoms. She is trying to drink fluids.  No syncopal episodes.  Remainder of 10 point Review of Systems negative.  Objective:  Vital signs in last 24 hours:  BP 145/72  Pulse 70  Temp(Src) 97.8 F (36.6 C) (Oral)  Resp 19  Ht 5\' 2"  (1.575 m)  Wt 140 lb 3.2 oz (63.594 kg)  BMI 25.64 kg/m2  Weight is stable. Easily mobile, wearing wig, NAD.  HEENT:PERRLA, sclera clear, anicteric and oropharynx clear, no lesions. Alopecia. Mucous membranes moist LymphaticsCervical, supraclavicular, and axillary nodes normal. No inguinal adenopathy Resp: clear to auscultation bilaterally and normal percussion bilaterally Cardio: regular rate and rhythm GI: soft, non-tender; bowel sounds normal; no masses,  no organomegaly not distended. Surgical scar not remarkable Extremities: no edema or tenderness, no cords LE. Right forearm laterally with 3 cm nontender cord no erythema, at site of recent IV. Neuro:no sensory deficits noted Breast:normal without suspicious masses, skin or nipple changes or axillary nodes Skin no rash or ecchymosis Port-A-Cath in place right anterior chest, nontender no other edema, no evidence of infection  Lab Results:  Results for orders placed in visit on 07/10/13  CBC WITH DIFFERENTIAL      Result Value Range   WBC 6.0  3.9 - 10.3 10e3/uL   NEUT# 4.7  1.5 - 6.5 10e3/uL   HGB 10.8 (*) 11.6 - 15.9 g/dL   HCT 16.1 (*) 09.6 - 04.5 %   Platelets 192  145 - 400 10e3/uL   MCV 97.3  79.5 - 101.0 fL   MCH 32.9  25.1 - 34.0 pg   MCHC 33.9  31.5 - 36.0 g/dL   RBC 4.09 (*) 8.11 - 9.14 10e6/uL   RDW 15.9 (*) 11.2 - 14.5 %   lymph# 1.1  0.9 - 3.3 10e3/uL   MONO# 0.2  0.1 - 0.9 10e3/uL   Eosinophils Absolute 0.0  0.0 - 0.5 10e3/uL   Basophils Absolute 0.0  0.0 - 0.1 10e3/uL   NEUT% 78.5 (*) 38.4 - 76.8 %   LYMPH% 17.7  14.0 - 49.7 %   MONO% 3.5  0.0 - 14.0 %   EOS% 0.0  0.0 - 7.0 %   BASO% 0.3  0.0 - 2.0 %   nRBC 0  0 - 0 %  COMPREHENSIVE METABOLIC PANEL (CC13)       Result Value Range   Sodium 136  136 - 145 mEq/L   Potassium 4.0  3.5 - 5.1 mEq/L   Chloride 103  98 - 109 mEq/L   CO2 26  22 - 29 mEq/L   Glucose 156 (*) 70 - 140 mg/dl   BUN 78.2  7.0 - 95.6 mg/dL   Creatinine 0.7  0.6 - 1.1 mg/dL   Total Bilirubin 2.13  0.20 - 1.20 mg/dL   Alkaline Phosphatase 61  40 - 150 U/L   AST 17  5 - 34 U/L   ALT 12  0 - 55 U/L   Total Protein 6.6  6.4 - 8.3 g/dL   Albumin 3.7  3.5 - 5.0 g/dL   Calcium 9.2  8.4 - 08.6 mg/dL  CA 578      Result Value Range   CA 125 16.4  0.0 - 30.2 U/mL  Lab Results  Component Value Date   GLUCOSE 156* 07/10/2013   BUN 11.7 07/10/2013   CO2 26 07/10/2013   ALT 12 07/10/2013   AST 17 07/10/2013   K 4.0 07/10/2013   CREATININE 0.7 07/10/2013   Last CMET 6-23 noted, normal with exception of T prot 6.3 Last CA 125 17.7 on 05-21-13, this down from preop 178  Studies/Results:  No results found.  Medications: I have reviewed the patient's current medications.   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 Cycle 2 delayed one week with neutropenia, then short hospitalizations after cycle 2 day 1 and again after day 8. Day 15 cycle 2 omitted due to beach vacation. Cycle 3 day 1 given 05-22-13 with neupogen 6-25 and 6-26 as well as additional IVF. She should have neupogen days 2,3,8,16 each cycle and IVF on days 2, 9, 16 each cycle, with total 6 cycles planned. She'll proceed with day 1 of cycle 5 today as scheduled. We will arrange for her on Neupogen injections IV fluid infusions.   Plan to start cycle #5 -D1   on 8/12  -D8   on 8/19 -D15 on 8/26 IV fluids and Neupogen on the D2,3,9,16  Then C6 D1 on 9/2.  This treatment outline will also be followed for cycle #6.  She will followup on  07/31/2013. She is agreeable to getting the IV fluids after her treatment days as well as the Neupogen injections. We will plan to schedule restaging CT scan of the abdomen and pelvis a few weeks after she completes cycle 6 then refer her back to Dr. Loree Fee for reevaluation.  2.HTN, elevated lipids, one episode of paroxysmal Afib. On pradaxa and ASA. HCTZ and losartan held since 04-09-13 and hydralazine now prn. Dr Ferd Hibbs continued help much appreciated.  3.hypothyroidism on replacement  4.osteoarthritis, post right hip replacement 2013  5. Previous enterobacter UTI 6. PAC placed by IR on 06-04-13.     Laural Benes, Ziad Maye E, PA-C  Addendum patient to be scheduled to see Dr. Darrold Span on 07/16/2013 for reevaluation as she is having a difficult time with this chemotherapy regimen. Patient is in agreement with this appointment. Will arrange for her to have a CBC differential and C. met performed prior to that visit

## 2013-07-13 NOTE — Telephone Encounter (Signed)
, °

## 2013-07-16 ENCOUNTER — Telehealth: Payer: Self-pay | Admitting: *Deleted

## 2013-07-16 ENCOUNTER — Other Ambulatory Visit (HOSPITAL_BASED_OUTPATIENT_CLINIC_OR_DEPARTMENT_OTHER): Payer: Medicare Other | Admitting: Lab

## 2013-07-16 ENCOUNTER — Ambulatory Visit: Payer: Medicare Other | Attending: Oncology | Admitting: Oncology

## 2013-07-16 ENCOUNTER — Other Ambulatory Visit: Payer: Self-pay | Admitting: Oncology

## 2013-07-16 ENCOUNTER — Ambulatory Visit (HOSPITAL_BASED_OUTPATIENT_CLINIC_OR_DEPARTMENT_OTHER): Payer: Medicare Other | Admitting: Lab

## 2013-07-16 ENCOUNTER — Telehealth: Payer: Self-pay | Admitting: Oncology

## 2013-07-16 ENCOUNTER — Encounter: Payer: Self-pay | Admitting: Oncology

## 2013-07-16 VITALS — BP 143/76 | HR 71 | Temp 97.9°F | Resp 18 | Ht 62.0 in | Wt 138.0 lb

## 2013-07-16 DIAGNOSIS — N390 Urinary tract infection, site not specified: Secondary | ICD-10-CM

## 2013-07-16 DIAGNOSIS — C569 Malignant neoplasm of unspecified ovary: Secondary | ICD-10-CM

## 2013-07-16 DIAGNOSIS — C561 Malignant neoplasm of right ovary: Secondary | ICD-10-CM

## 2013-07-16 LAB — COMPREHENSIVE METABOLIC PANEL (CC13)
ALT: 16 U/L (ref 0–55)
BUN: 7.7 mg/dL (ref 7.0–26.0)
CO2: 27 mEq/L (ref 22–29)
Calcium: 9.1 mg/dL (ref 8.4–10.4)
Chloride: 102 mEq/L (ref 98–109)
Creatinine: 0.7 mg/dL (ref 0.6–1.1)

## 2013-07-16 LAB — CBC WITH DIFFERENTIAL/PLATELET
Basophils Absolute: 0.1 10*3/uL (ref 0.0–0.1)
Eosinophils Absolute: 0 10*3/uL (ref 0.0–0.5)
HCT: 30.1 % — ABNORMAL LOW (ref 34.8–46.6)
HGB: 10.5 g/dL — ABNORMAL LOW (ref 11.6–15.9)
MONO#: 0.3 10*3/uL (ref 0.1–0.9)
NEUT%: 49.8 % (ref 38.4–76.8)
WBC: 3.8 10*3/uL — ABNORMAL LOW (ref 3.9–10.3)
lymph#: 1.6 10*3/uL (ref 0.9–3.3)

## 2013-07-16 LAB — URINALYSIS, MICROSCOPIC - CHCC
Bilirubin (Urine): NEGATIVE
Ketones: NEGATIVE mg/dL
Leukocyte Esterase: NEGATIVE
Protein: NEGATIVE mg/dL
RBC / HPF: NEGATIVE (ref 0–2)

## 2013-07-16 NOTE — Telephone Encounter (Signed)
Message copied by Phillis Knack on Mon Jul 16, 2013  4:12 PM ------      Message from: Jama Flavors P      Created: Mon Jul 16, 2013  2:10 PM       Adalbert Alberto:            Are IVF orders in for 8-20, 8-27 and 9-3?            Please let patient know that her potassium today was 4.4. Since she is off the HCTZ, I think she could decrease her oral K to 10 mEq MWF instead of daily.            We need to be sure to let her know about the urine culture when that results from today. The plain UA looks ok, so will not start any antibiotics awaiting culture. She should let us know if any more fever.            Thanks --  Lennis ------

## 2013-07-16 NOTE — Progress Notes (Signed)
OFFICE PROGRESS NOTE   07/16/2013   Physicians:D. ClarkePearson; J.Russo, T.Fontaine, [name], [name]. Last gastroenterologist in [place].   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 Course has been complicated symptomatic hypotension now off BP medications, and UTI, with hospitalizations x 2 during cycle 2 treatment. She has needed gCSF and IVF day after each treatment; constipation and po fluid intake are somewhat improved now. She now has PAC, which has been very helpful. Next appointment with Dr Timothy Lasso is scheduled for December.  Oncologic History  She had ~ 2 weeks vaginal spotting in late Dec 2013, then presented to [name] in late Jan 2014 after bright red spotting that AM, with uterus larger than had been apparent on exam in Oct 2013. Sonohystogram 01-05-13 showed uterus normal size and echotexture, endometrium 4.3 mm, left ovary normal and right adnexa with 1.1 x 8.4.x8.2 cm cystic and solid mass. Endometrial biopsy benign and CA 125 also on 01-05-13 was 178.8. She had CT AP 01-17-13 with 1.0 x 6.9 x 8.9 cm complex right ovarian mass, no ascites, small retroperitoneal nodes. She was seen by [name] on 01-18-13 and taken to surgery by [name] on 02-13-13, which was TAH/BSO/ omentectomy/ureterolysis/ resection of cul-de-sac tumor/right pelvic lymphadnectomy and resection of rectum with reanastomosis. At completion of surgery there was no gross residual disease. 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  Wash[facility] [phone]) had rare clusters of atypical cells. Chemotherapy with dose dense taxol carboplatin was begun day 1 cycle 1 on 03-20-13; ANC was 1.1 on day 15 cycle 1 with taxol given and neupogen added 04-04-13. She had day 1 cycle 2 treatment on 04-17-13, then was briefly hospitalized 5-22 to 04-20-13 after syncopal episode, with antihypertensive agents DCd and UTI treated. She was feeling much better at time of "day 8" cycle 2 on 05-01-13 and did have neupogen 300 mcg x 1 dose on 05-02-13. She was readmitted to hospital 6-5 thru 05-05-13 with fever, empirically on antibiotics and blood cultures negative. She then went on planned beach vacation such that day 1 cycle 3 was administered on 05-22-13, with neupogen on 6-25 and 6-26 with IVF also on day 2. Day 8 cycle 4 was delayed a week until 06-26-13 due to low ANC.    Review of systems as above, also: Fatigue is most bothersome symptom now, especially on Thursdays after treatments on Tuesdays likely due to steroids out of her system by then. Bowels are moving daily with addition of colace to miralax and senokot. 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 She has had temperature to ~ 100 evening after chemo for last 2 treatments and, with UTI history, we will check urine specimen today; the fever has promptly resolved with tylenol each time, no other clear localizing symptoms. No abdominal pain, nausea, bleeding, LE swelling, respiratory symptoms. No problems with PAC. Remainder of 10 point Review of Systems negative.  Objective:  Vital signs in last 24 hours:  BP 143/76  Pulse 71  Temp(Src) 97.9 F (36.6 C)  Resp 18  Ht 5'  2" (1.575 m)  Wt 138 lb (62.596 kg)  BMI 25.23 kg/m2  Easily ambulatory, NAD, fluent conversation.  HEENT: Complete alopecia. PERRL, not icteric. Oral mucosa moist and clear, post pharynx likewise. Neck supple without JVD. Lymphatics No cervical,  supraclavicular, axillary or inguinal adenopathy Resp: clear to A and P Cardio: RRR no gallop GI: soft, nontender including epigastrium. Abdomen not distended, normally active BS, no HSM or mass. Extremities: no pitting edema, cords, tenderness Neuro: nonfocal except decreased sensation left 1st and 2nd toes Skin without rash or ecchymosis Portacath-without erythema or tenderness  Lab Results:  Results for orders placed in visit on 07/16/13  CBC WITH DIFFERENTIAL      Result Value Range   WBC 3.8 (*) 3.9 - 10.3 10e3/uL   NEUT# 1.9  1.5 - 6.5 10e3/uL   HGB 10.5 (*) 11.6 - 15.9 g/dL   HCT 16.1 (*) 09.6 - 04.5 %   Platelets 131 (*) 145 - 400 10e3/uL   MCV 97.9  79.5 - 101.0 fL   MCH 34.2 (*) 25.1 - 34.0 pg   MCHC 35.0  31.5 - 36.0 g/dL   RBC 4.09 (*) 8.11 - 9.14 10e6/uL   RDW 17.1 (*) 11.2 - 14.5 %   lymph# 1.6  0.9 - 3.3 10e3/uL   MONO# 0.3  0.1 - 0.9 10e3/uL   Eosinophils Absolute 0.0  0.0 - 0.5 10e3/uL   Basophils Absolute 0.1  0.0 - 0.1 10e3/uL   NEUT% 49.8  38.4 - 76.8 %   LYMPH% 40.6  14.0 - 49.7 %   MONO% 7.5  0.0 - 14.0 %   EOS% 0.8  0.0 - 7.0 %   BASO% 1.3  0.0 - 2.0 %  COMPREHENSIVE METABOLIC PANEL (CC13)      Result Value Range   Sodium 139  136 - 145 mEq/L   Potassium 4.4  3.5 - 5.1 mEq/L   Chloride 102  98 - 109 mEq/L   CO2 27  22 - 29 mEq/L   Glucose 104  70 - 140 mg/dl   BUN 7.7  7.0 - 78.2 mg/dL   Creatinine 0.7  0.6 - 1.1 mg/dL   Total Bilirubin 9.56  0.20 - 1.20 mg/dL   Alkaline Phosphatase 58  40 - 150 U/L   AST 18  5 - 34 U/L   ALT 16  0 - 55 U/L   Total Protein 6.0 (*) 6.4 - 8.3 g/dL   Albumin 3.4 (*) 3.5 - 5.0 g/dL   Calcium 9.1  8.4 - 21.3 mg/dL    Blood counts reviewed with patient. Chemistries resulted after visit (see note re K below) Studies/Results:  No results found.  Medications: I have reviewed the patient's current medications. She continues potassium 10 mEq daily tho she has been off HCTZ since hypotensive episode. We will let her  know that she should decrease K to MWF for now.  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 Day 8 cycle 5 will be given 07-17-13. She will continue neupogen days 2,3,8,16 each cycle and IVF on days 2, 9, 16 each cycle. Note patient is reluctant to have the extra IVF, however agrees that we need to be proactive due to previous hospitalizations.  She will have restaging CT AP after chemo completes, shortly prior to visit back to gyn  oncology 09-28-13.  2.HTN, elevated lipids, one episode of paroxysmal Afib. On pradaxa and ASA. HCTZ and losartan held since 04-09-13 and hydralazine now prn. Dr Ferd Hibbs help much appreciated.  3.hypothyroidism on replacement  4.osteoarthritis, post right hip replacement 2013  5. Previous enterobacter UTI. With low grade temperatures after last 2 treatments will recheck urine today. 6. PAC in  7. K+ in good range and should be able to decrease supplemental K now that she is off HCTZ.  Patient understood and agreed with plan above.        LIVESAY,LENNIS P, MD   07/16/2013, 10:23 AM

## 2013-07-16 NOTE — Telephone Encounter (Signed)
Pt notified of note below. Will decrease potassium to 10 meq MWF.

## 2013-07-17 ENCOUNTER — Ambulatory Visit (HOSPITAL_BASED_OUTPATIENT_CLINIC_OR_DEPARTMENT_OTHER): Payer: Medicare Other

## 2013-07-17 ENCOUNTER — Other Ambulatory Visit: Payer: Medicare Other | Admitting: Lab

## 2013-07-17 VITALS — BP 117/70 | HR 69 | Temp 97.0°F | Resp 18

## 2013-07-17 DIAGNOSIS — Z5111 Encounter for antineoplastic chemotherapy: Secondary | ICD-10-CM

## 2013-07-17 DIAGNOSIS — C569 Malignant neoplasm of unspecified ovary: Secondary | ICD-10-CM

## 2013-07-17 LAB — URINE CULTURE

## 2013-07-17 MED ORDER — HEPARIN SOD (PORK) LOCK FLUSH 100 UNIT/ML IV SOLN
500.0000 [IU] | Freq: Once | INTRAVENOUS | Status: AC | PRN
  Administered 2013-07-17: 500 [IU]
  Filled 2013-07-17: qty 5

## 2013-07-17 MED ORDER — SODIUM CHLORIDE 0.9 % IJ SOLN
10.0000 mL | INTRAMUSCULAR | Status: DC | PRN
  Administered 2013-07-17: 10 mL
  Filled 2013-07-17: qty 10

## 2013-07-17 MED ORDER — ONDANSETRON 8 MG/50ML IVPB (CHCC)
8.0000 mg | Freq: Once | INTRAVENOUS | Status: AC
  Administered 2013-07-17: 8 mg via INTRAVENOUS

## 2013-07-17 MED ORDER — SODIUM CHLORIDE 0.9 % IV SOLN
Freq: Once | INTRAVENOUS | Status: AC
  Administered 2013-07-17: 11:00:00 via INTRAVENOUS

## 2013-07-17 MED ORDER — DIPHENHYDRAMINE HCL 50 MG/ML IJ SOLN
25.0000 mg | Freq: Once | INTRAMUSCULAR | Status: AC
  Administered 2013-07-17: 25 mg via INTRAVENOUS

## 2013-07-17 MED ORDER — DEXAMETHASONE SODIUM PHOSPHATE 20 MG/5ML IJ SOLN
20.0000 mg | Freq: Once | INTRAMUSCULAR | Status: AC
  Administered 2013-07-17: 20 mg via INTRAVENOUS

## 2013-07-17 MED ORDER — FAMOTIDINE IN NACL 20-0.9 MG/50ML-% IV SOLN
20.0000 mg | Freq: Once | INTRAVENOUS | Status: AC
  Administered 2013-07-17: 20 mg via INTRAVENOUS

## 2013-07-17 MED ORDER — PACLITAXEL CHEMO INJECTION 300 MG/50ML
80.0000 mg/m2 | Freq: Once | INTRAVENOUS | Status: AC
  Administered 2013-07-17: 132 mg via INTRAVENOUS
  Filled 2013-07-17: qty 22

## 2013-07-17 NOTE — Patient Instructions (Signed)
[facility] Discharge Instructions for Patients Receiving Chemotherapy  Today you received the following chemotherapy agents: taxol  To help prevent nausea and vomiting after your treatment, we encourage you to take your nausea medication.  Take it as often as prescribed.     If you develop nausea and vomiting that is not controlled by your nausea medication, call the clinic. If it is after clinic hours your family physician or the after hours number for the clinic or go to the Emergency Department.   BELOW ARE SYMPTOMS THAT SHOULD BE REPORTED IMMEDIATELY:  *FEVER GREATER THAN 100.5 F  *CHILLS WITH OR WITHOUT FEVER  NAUSEA AND VOMITING THAT IS NOT CONTROLLED WITH YOUR NAUSEA MEDICATION  *UNUSUAL SHORTNESS OF BREATH  *UNUSUAL BRUISING OR BLEEDING  TENDERNESS IN MOUTH AND THROAT WITH OR WITHOUT PRESENCE OF ULCERS  *URINARY PROBLEMS  *BOWEL PROBLEMS  UNUSUAL RASH Items with * indicate a potential emergency and should be followed up as soon as possible.  Feel free to call the clinic you have any questions or concerns. The clinic phone number is [phone].   I have been informed and understand all the instructions given to me. I know to contact the clinic, my physician, or go to the Emergency Department if any problems should occur. I do not have any questions at this time, but understand that I may call the clinic during office hours   should I have any questions or need assistance in obtaining follow up care.    __________________________________________  _____________  __________ Signature of Patient or Authorized Representative            Date                   Time    __________________________________________ Nurse's Signature    

## 2013-07-18 ENCOUNTER — Ambulatory Visit: Payer: Medicare Other

## 2013-07-18 ENCOUNTER — Ambulatory Visit (HOSPITAL_BASED_OUTPATIENT_CLINIC_OR_DEPARTMENT_OTHER): Payer: Medicare Other

## 2013-07-18 VITALS — BP 125/72 | HR 78 | Temp 97.3°F | Resp 19 | Ht 62.0 in

## 2013-07-18 DIAGNOSIS — R971 Elevated cancer antigen 125 [CA 125]: Secondary | ICD-10-CM

## 2013-07-18 DIAGNOSIS — C569 Malignant neoplasm of unspecified ovary: Secondary | ICD-10-CM

## 2013-07-18 MED ORDER — FILGRASTIM 300 MCG/0.5ML IJ SOLN
300.0000 ug | Freq: Once | INTRAMUSCULAR | Status: AC
  Administered 2013-07-18: 300 ug via SUBCUTANEOUS
  Filled 2013-07-18: qty 0.5

## 2013-07-18 MED ORDER — SODIUM CHLORIDE 0.9 % IJ SOLN
10.0000 mL | INTRAMUSCULAR | Status: DC | PRN
  Administered 2013-07-18: 10 mL via INTRAVENOUS
  Filled 2013-07-18: qty 10

## 2013-07-18 MED ORDER — HEPARIN SOD (PORK) LOCK FLUSH 100 UNIT/ML IV SOLN
500.0000 [IU] | Freq: Once | INTRAVENOUS | Status: AC
  Administered 2013-07-18: 500 [IU] via INTRAVENOUS
  Filled 2013-07-18: qty 5

## 2013-07-18 MED ORDER — SODIUM CHLORIDE 0.9 % IV SOLN
Freq: Once | INTRAVENOUS | Status: AC
  Administered 2013-07-18: 10:00:00 via INTRAVENOUS

## 2013-07-18 NOTE — Patient Instructions (Addendum)
Dehydration, Adult  Dehydration means your body does not have as much fluid as it needs. Your kidneys, brain, and heart will not work properly without the right amount of fluids and salt.   HOME CARE   Ask your doctor how to replace body fluid losses (rehydrate).   Drink enough fluids to keep your pee (urine) clear or pale yellow.   Drink small amounts of fluids often if you feel sick to your stomach (nauseous) or throw up (vomit).   Eat like you normally do.   Avoid:   Foods or drinks high in sugar.   Bubbly (carbonated) drinks.   Juice.   Very hot or cold fluids.   Drinks with caffeine.   Fatty, greasy foods.   Alcohol.   Tobacco.   Eating too much.   Gelatin desserts.   Wash your hands to avoid spreading germs (bacteria, viruses).   Only take medicine as told by your doctor.   Keep all doctor visits as told.  GET HELP RIGHT AWAY IF:    You cannot drink something without throwing up.   You get worse even with treatment.   Your vomit has blood in it or looks greenish.   Your poop (stool) has blood in it or looks black and tarry.   You have not peed in 6 to 8 hours.   You pee a small amount of very dark pee.   You have a fever.   You pass out (faint).   You have belly (abdominal) pain that gets worse or stays in one spot (localizes).   You have a rash, stiff neck, or bad headache.   You get easily annoyed, sleepy, or are hard to wake up.   You feel weak, dizzy, or very thirsty.  MAKE SURE YOU:    Understand these instructions.   Will watch your condition.   Will get help right away if you are not doing well or get worse.  Document Released: 09/11/2009 Document Revised: 02/07/2012 Document Reviewed: 07/05/2011  Exit[employer] Patient Information 2014 [employer].

## 2013-07-19 ENCOUNTER — Telehealth: Payer: Self-pay | Admitting: *Deleted

## 2013-07-19 NOTE — Telephone Encounter (Signed)
Pt notified of results below 

## 2013-07-19 NOTE — Telephone Encounter (Signed)
Message copied by Phillis Knack on Thu Jul 19, 2013  9:41 AM ------      Message from: Jama Flavors P      Created: Wed Jul 18, 2013  8:56 AM       Please let her know that the urine culture from 8-18 does not show any infection            Cc Jaidyn Usery, Melissa C. ------

## 2013-07-21 ENCOUNTER — Other Ambulatory Visit: Payer: Self-pay | Admitting: Oncology

## 2013-07-21 DIAGNOSIS — C561 Malignant neoplasm of right ovary: Secondary | ICD-10-CM

## 2013-07-21 MED ORDER — SODIUM CHLORIDE 0.9 % IV SOLN: INTRAVENOUS | Status: DC

## 2013-07-24 ENCOUNTER — Other Ambulatory Visit (HOSPITAL_BASED_OUTPATIENT_CLINIC_OR_DEPARTMENT_OTHER): Payer: Medicare Other | Admitting: Lab

## 2013-07-24 ENCOUNTER — Ambulatory Visit (HOSPITAL_BASED_OUTPATIENT_CLINIC_OR_DEPARTMENT_OTHER): Payer: Medicare Other

## 2013-07-24 VITALS — BP 134/70 | HR 73 | Temp 97.6°F | Resp 18

## 2013-07-24 DIAGNOSIS — C569 Malignant neoplasm of unspecified ovary: Secondary | ICD-10-CM

## 2013-07-24 DIAGNOSIS — C561 Malignant neoplasm of right ovary: Secondary | ICD-10-CM

## 2013-07-24 DIAGNOSIS — Z5111 Encounter for antineoplastic chemotherapy: Secondary | ICD-10-CM

## 2013-07-24 LAB — CBC WITH DIFFERENTIAL/PLATELET
BASO%: 0.2 % (ref 0.0–2.0)
Eosinophils Absolute: 0 10*3/uL (ref 0.0–0.5)
MCV: 96.7 fL (ref 79.5–101.0)
MONO%: 1.7 % (ref 0.0–14.0)
NEUT#: 4.4 10*3/uL (ref 1.5–6.5)
RBC: 3.3 10*6/uL — ABNORMAL LOW (ref 3.70–5.45)
RDW: 15.6 % — ABNORMAL HIGH (ref 11.2–14.5)
WBC: 5.3 10*3/uL (ref 3.9–10.3)
nRBC: 0 % (ref 0–0)

## 2013-07-24 LAB — COMPREHENSIVE METABOLIC PANEL (CC13)
ALT: 16 U/L (ref 0–55)
AST: 15 U/L (ref 5–34)
Alkaline Phosphatase: 65 U/L (ref 40–150)
CO2: 24 mEq/L (ref 22–29)
Sodium: 137 mEq/L (ref 136–145)
Total Bilirubin: 0.53 mg/dL (ref 0.20–1.20)
Total Protein: 6.4 g/dL (ref 6.4–8.3)

## 2013-07-24 MED ORDER — ONDANSETRON 8 MG/50ML IVPB (CHCC)
8.0000 mg | Freq: Once | INTRAVENOUS | Status: AC
  Administered 2013-07-24: 8 mg via INTRAVENOUS

## 2013-07-24 MED ORDER — PACLITAXEL CHEMO INJECTION 300 MG/50ML
80.0000 mg/m2 | Freq: Once | INTRAVENOUS | Status: AC
  Administered 2013-07-24: 132 mg via INTRAVENOUS
  Filled 2013-07-24: qty 22

## 2013-07-24 MED ORDER — FAMOTIDINE IN NACL 20-0.9 MG/50ML-% IV SOLN
20.0000 mg | Freq: Once | INTRAVENOUS | Status: AC
  Administered 2013-07-24: 20 mg via INTRAVENOUS

## 2013-07-24 MED ORDER — HEPARIN SOD (PORK) LOCK FLUSH 100 UNIT/ML IV SOLN
500.0000 [IU] | Freq: Once | INTRAVENOUS | Status: AC | PRN
  Administered 2013-07-24: 500 [IU]
  Filled 2013-07-24: qty 5

## 2013-07-24 MED ORDER — SODIUM CHLORIDE 0.9 % IV SOLN
Freq: Once | INTRAVENOUS | Status: AC
  Administered 2013-07-24: 11:00:00 via INTRAVENOUS

## 2013-07-24 MED ORDER — DIPHENHYDRAMINE HCL 50 MG/ML IJ SOLN
25.0000 mg | Freq: Once | INTRAMUSCULAR | Status: AC
  Administered 2013-07-24: 25 mg via INTRAVENOUS

## 2013-07-24 MED ORDER — SODIUM CHLORIDE 0.9 % IJ SOLN
10.0000 mL | INTRAMUSCULAR | Status: DC | PRN
  Administered 2013-07-24: 10 mL
  Filled 2013-07-24: qty 10

## 2013-07-24 MED ORDER — DEXAMETHASONE SODIUM PHOSPHATE 20 MG/5ML IJ SOLN
20.0000 mg | Freq: Once | INTRAMUSCULAR | Status: AC
  Administered 2013-07-24: 20 mg via INTRAVENOUS

## 2013-07-24 NOTE — Patient Instructions (Addendum)
[facility] Discharge Instructions for Patients Receiving Chemotherapy  Today you received the following chemotherapy agent Taxol  To help prevent nausea and vomiting after your treatment, we encourage you to take your nausea medication.   If you develop nausea and vomiting that is not controlled by your nausea medication, call the clinic.   BELOW ARE SYMPTOMS THAT SHOULD BE REPORTED IMMEDIATELY:  *FEVER GREATER THAN 100.5 F  *CHILLS WITH OR WITHOUT FEVER  NAUSEA AND VOMITING THAT IS NOT CONTROLLED WITH YOUR NAUSEA MEDICATION  *UNUSUAL SHORTNESS OF BREATH  *UNUSUAL BRUISING OR BLEEDING  TENDERNESS IN MOUTH AND THROAT WITH OR WITHOUT PRESENCE OF ULCERS  *URINARY PROBLEMS  *BOWEL PROBLEMS  UNUSUAL RASH Items with * indicate a potential emergency and should be followed up as soon as possible.  Feel free to call the clinic you have any questions or concerns. The clinic phone number is [phone].   Paclitaxel injection What is this medicine? PACLITAXEL (PAK li TAX el) is a chemotherapy drug. It targets fast dividing cells, like cancer cells, and causes these cells to die. This medicine is used to treat ovarian cancer, breast cancer, and other cancers. This medicine may be used for other purposes; ask your health care provider or pharmacist if you have questions. What should I tell my health care provider before I take this medicine? They need to know if you have any of these conditions: -blood disorders -irregular heartbeat -infection (especially a virus infection such as chickenpox, cold sores, or herpes) -liver disease -previous or ongoing radiation therapy -an unusual or allergic reaction to paclitaxel, alcohol, polyoxyethylated castor oil, other chemotherapy agents, other medicines, foods, dyes, or preservatives -pregnant or trying to get pregnant -breast-feeding How should I use this medicine? This drug is given as an infusion into a vein. It is  administered in a hospital or clinic by a specially trained health care professional. Talk to your pediatrician regarding the use of this medicine in children. Special care may be needed. Overdosage: If you think you have taken too much of this medicine contact a poison control center or emergency room at once. NOTE: This medicine is only for you. Do not share this medicine with others. What if I miss a dose? It is important not to miss your dose. Call your doctor or health care professional if you are unable to keep an appointment. What may interact with this medicine? Do not take this medicine with any of the following medications: -disulfiram -metronidazole This medicine may also interact with the following medications: -cyclosporine -dexamethasone -diazepam -ketoconazole -medicines to increase blood counts like filgrastim, pegfilgrastim, sargramostim -other chemotherapy drugs like cisplatin, doxorubicin, epirubicin, etoposide, teniposide, vincristine -quinidine -testosterone -vaccines -verapamil Talk to your doctor or health care professional before taking any of these medicines: -acetaminophen -aspirin -ibuprofen -ketoprofen -naproxen This list may not describe all possible interactions. Give your health care provider a list of all the medicines, herbs, non-prescription drugs, or dietary supplements you use. Also tell them if you smoke, drink alcohol, or use illegal drugs. Some items may interact with your medicine. What should I watch for while using this medicine? Your condition will be monitored carefully while you are receiving this medicine. You will need important blood work done while you are taking this medicine. This drug may make you feel generally unwell. This is not uncommon, as chemotherapy can affect healthy cells as well as cancer cells. Report any side effects. Continue your course of treatment even though you feel ill unless  your doctor tells you to stop. In some  cases, you may be given additional medicines to help with side effects. Follow all directions for their use. Call your doctor or health care professional for advice if you get a fever, chills or sore throat, or other symptoms of a cold or flu. Do not treat yourself. This drug decreases your body's ability to fight infections. Try to avoid being around people who are sick. This medicine may increase your risk to bruise or bleed. Call your doctor or health care professional if you notice any unusual bleeding. Be careful brushing and flossing your teeth or using a toothpick because you may get an infection or bleed more easily. If you have any dental work done, tell your dentist you are receiving this medicine. Avoid taking products that contain aspirin, acetaminophen, ibuprofen, naproxen, or ketoprofen unless instructed by your doctor. These medicines may hide a fever. Do not become pregnant while taking this medicine. Women should inform their doctor if they wish to become pregnant or think they might be pregnant. There is a potential for serious side effects to an unborn child. Talk to your health care professional or pharmacist for more information. Do not breast-feed an infant while taking this medicine. Men are advised not to father a child while receiving this medicine. What side effects may I notice from receiving this medicine? Side effects that you should report to your doctor or health care professional as soon as possible: -allergic reactions like skin rash, itching or hives, swelling of the face, lips, or tongue -low blood counts - This drug may decrease the number of white blood cells, red blood cells and platelets. You may be at increased risk for infections and bleeding. -signs of infection - fever or chills, cough, sore throat, pain or difficulty passing urine -signs of decreased platelets or bleeding - bruising, pinpoint red spots on the skin, black, tarry stools, nosebleeds -signs of  decreased red blood cells - unusually weak or tired, fainting spells, lightheadedness -breathing problems -chest pain -high or low blood pressure -mouth sores -nausea and vomiting -pain, swelling, redness or irritation at the injection site -pain, tingling, numbness in the hands or feet -slow or irregular heartbeat -swelling of the ankle, feet, hands Side effects that usually do not require medical attention (report to your doctor or health care professional if they continue or are bothersome): -bone pain -complete hair loss including hair on your head, underarms, pubic hair, eyebrows, and eyelashes -changes in the color of fingernails -diarrhea -loosening of the fingernails -loss of appetite -muscle or joint pain -red flush to skin -sweating This list may not describe all possible side effects. Call your doctor for medical advice about side effects. You may report side effects to FDA at 1-800-FDA-1088. Where should I keep my medicine? This drug is given in a hospital or clinic and will not be stored at home. NOTE: This sheet is a summary. It may not cover all possible information. If you have questions about this medicine, talk to your doctor, pharmacist, or health care provider.  2013, [employer]. (10/28/2008 11:54:26 AM)

## 2013-07-25 ENCOUNTER — Telehealth: Payer: Self-pay | Admitting: *Deleted

## 2013-07-25 ENCOUNTER — Ambulatory Visit: Payer: Medicare Other

## 2013-07-25 ENCOUNTER — Ambulatory Visit: Payer: Medicare Other | Attending: Oncology | Admitting: Oncology

## 2013-07-25 ENCOUNTER — Telehealth: Payer: Self-pay | Admitting: Oncology

## 2013-07-25 ENCOUNTER — Ambulatory Visit (HOSPITAL_BASED_OUTPATIENT_CLINIC_OR_DEPARTMENT_OTHER): Payer: Medicare Other

## 2013-07-25 ENCOUNTER — Other Ambulatory Visit: Payer: Self-pay | Admitting: Gynecologic Oncology

## 2013-07-25 VITALS — BP 140/79 | HR 67 | Temp 97.2°F | Resp 20 | Ht 62.0 in | Wt 140.5 lb

## 2013-07-25 DIAGNOSIS — R971 Elevated cancer antigen 125 [CA 125]: Secondary | ICD-10-CM

## 2013-07-25 DIAGNOSIS — C569 Malignant neoplasm of unspecified ovary: Secondary | ICD-10-CM

## 2013-07-25 DIAGNOSIS — C561 Malignant neoplasm of right ovary: Secondary | ICD-10-CM

## 2013-07-25 DIAGNOSIS — I10 Essential (primary) hypertension: Secondary | ICD-10-CM

## 2013-07-25 DIAGNOSIS — Z5189 Encounter for other specified aftercare: Secondary | ICD-10-CM

## 2013-07-25 MED ORDER — FILGRASTIM 300 MCG/0.5ML IJ SOLN
300.0000 ug | Freq: Once | INTRAMUSCULAR | Status: AC
  Administered 2013-07-25: 300 ug via SUBCUTANEOUS
  Filled 2013-07-25: qty 0.5

## 2013-07-25 MED ORDER — SODIUM CHLORIDE 0.9 % IV SOLN
INTRAVENOUS | Status: AC
  Administered 2013-07-25: 1000 mL via INTRAVENOUS

## 2013-07-25 MED ORDER — FILGRASTIM 300 MCG/0.5ML IJ SOLN
300.0000 ug | Freq: Once | INTRAMUSCULAR | Status: DC
  Filled 2013-07-25: qty 0.5

## 2013-07-25 NOTE — Progress Notes (Signed)
OFFICE PROGRESS NOTE   07/25/2013   Physicians:D. ClarkePearson; J.Russo, T.Fontaine, [name], [name]. Last gastroenterologist in [place].   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 She will receive IVF with neupogen today, as this support after each treatment has been helpful. She feels fatigued and a little unsteady today, but no acute problems. She did eat this AM; bowels moved yesterday. She has had no further fever, and urine culture from 07-16-13 was negative. She has PAC.   ONCOLOGIC HISTORY She had ~ 2 weeks vaginal spotting in late Dec 2013, then presented to [name] in late Jan 2014 after bright red spotting that AM, with uterus larger than had been apparent on exam in Oct 2013. Sonohystogram 01-05-13 showed uterus normal size and echotexture, endometrium 4.3 mm, left ovary normal and right adnexa with 1.1 x 8.4.x8.2 cm cystic and solid mass. Endometrial biopsy benign and CA 125 also on 01-05-13 was 178.8. She had CT AP 01-17-13 with 1.0 x 6.9 x 8.9 cm complex right ovarian mass, no ascites, small retroperitoneal nodes. She was seen by [name] on 01-18-13 and taken to surgery by [name] on 02-13-13, which was TAH/BSO/ omentectomy/ureterolysis/ resection of cul-de-sac tumor/right pelvic lymphadnectomy and resection of rectum with reanastomosis. At completion of surgery there was no gross residual disease. 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 Washings [phone]) had rare clusters of atypical  cells. Chemotherapy with dose dense taxol carboplatin was begun day 1 cycle 1 on 03-20-13; ANC was 1.1 on day 15 cycle 1 with taxol given and neupogen added 04-04-13. She had day 1 cycle 2 treatment on 04-17-13, then was briefly hospitalized 5-22 to 04-20-13 after syncopal episode, with antihypertensive agents DCd and UTI treated. She was feeling much better at time of "day 8" cycle 2 on 05-01-13 and did have neupogen 300 mcg x 1 dose on 05-02-13. She was readmitted to hospital 6-5 thru 05-05-13 with fever, empirically on antibiotics and blood cultures negative. She then went on planned beach vacation such that day 1 cycle 3 was administered on 05-22-13, with neupogen on 6-25 and 6-26 with IVF also on day 2. Day 8 cycle 4 was delayed a week until 06-26-13 due to low ANC.   Review of systems as above, also: No dysuria. No bleeding. No SOB or other respiratory symptoms and no chest pain. PAC functioning well. No increase in peripheral neuropathy  Remainder of 10 point Review of Systems negative.  Objective:  Vital signs in last 24 hours:  BP 140/79  Pulse 67  Temp(Src) 97.2 F (36.2 C) (Oral)  Resp 20  Ht 5\' 2"  (1.575 m)  Wt 140 lb 8 oz (63.73 kg)  BMI 25.69 kg/m2  Alert, oriented and appropriate, looks somewhat tired but NAD. Ambulatory without assistance, able to get on and off exam table without difficulty. Alopecia  HEENT:PERRL, sclerae not icteric. Oral mucosa moist without lesions, posterior pharynx clear. Neck supple.  Lymphatics no cervical,suraclavicular, axillary or inguinal adenopathy Resp: clear to auscultation bilaterally and normal percussion bilaterally Cardio: regular rate and rhythm. No gallop. GI: soft, nontender, not distended, no mass  or organomegaly, some BS. Surgical incision not remarkable. Extremities: without pitting edema, cords, tenderness Neuro: nonfocal Skin without rash, ecchymosis, petechiae Portacath-without erythema or tenderness  Lab Results:  Results for orders  placed in visit on 07/24/13  CBC WITH DIFFERENTIAL      Result Value Range   WBC 5.3  3.9 - 10.3 10e3/uL   NEUT# 4.4  1.5 - 6.5 10e3/uL   HGB 10.7 (*) 11.6 - 15.9 g/dL   HCT 78.4 (*) 69.6 - 29.5 %   Platelets 182  145 - 400 10e3/uL   MCV 96.7  79.5 - 101.0 fL   MCH 32.4  25.1 - 34.0 pg   MCHC 33.5  31.5 - 36.0 g/dL   RBC 2.84 (*) 1.32 - 4.40 10e6/uL   RDW 15.6 (*) 11.2 - 14.5 %   lymph# 0.8 (*) 0.9 - 3.3 10e3/uL   MONO# 0.1  0.1 - 0.9 10e3/uL   Eosinophils Absolute 0.0  0.0 - 0.5 10e3/uL   Basophils Absolute 0.0  0.0 - 0.1 10e3/uL   NEUT% 83.4 (*) 38.4 - 76.8 %   LYMPH% 14.7  14.0 - 49.7 %   MONO% 1.7  0.0 - 14.0 %   EOS% 0.0  0.0 - 7.0 %   BASO% 0.2  0.0 - 2.0 %   nRBC 0  0 - 0 %  COMPREHENSIVE METABOLIC PANEL (CC13)      Result Value Range   Sodium 137  136 - 145 mEq/L   Potassium 4.0  3.5 - 5.1 mEq/L   Chloride 102  98 - 109 mEq/L   CO2 24  22 - 29 mEq/L   Glucose 184 (*) 70 - 140 mg/dl   BUN 10.2  7.0 - 72.5 mg/dL   Creatinine 0.7  0.6 - 1.1 mg/dL   Total Bilirubin 3.66  0.20 - 1.20 mg/dL   Alkaline Phosphatase 65  40 - 150 U/L   AST 15  5 - 34 U/L   ALT 16  0 - 55 U/L   Total Protein 6.4  6.4 - 8.3 g/dL   Albumin 3.6  3.5 - 5.0 g/dL   Calcium 9.2  8.4 - 44.0 mg/dL  CA 347      Result Value Range   CA 125 18.3  0.0 - 30.2 U/mL     Studies/Results:  No results found.  Medications: I have reviewed the patient's current medications. She is taking potassium just MWF now, as she is off HCTZ and labs good as above.  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 Day 1 cycle 6 will be given 07-31-13. She will continue neupogen days 2,3,8,16 each cycle and IVF on days 2, 9, 16 each cycle. Note patient is reluctant to have the  extra IVF, however agrees that we need to be proactive due to previous hospitalizations. I will see her on 9-8 prior to day 8 cycle 6 on 08-08-13. She will have restaging CT AP after chemo completes, shortly prior to visit back to gyn oncology 09-28-13.  2.HTN, elevated lipids, one episode of paroxysmal Afib. On pradaxa and ASA. HCTZ and losartan held since 04-09-13 and hydralazine now prn. Dr Ferd Hibbs help much appreciated.  3.hypothyroidism on replacement  4.osteoarthritis, post right hip replacement 2013  5. Previous UTI. Negative urine culture 07-16-13  6. PAC in  7. K+ in good range, supplemental K decreased as she is now off HCTZ Patient understood and agreed with plan above.    [name] P, MD   07/25/2013, 9:57 AM

## 2013-07-25 NOTE — Progress Notes (Signed)
Neupogen injection given in infusion room while getting fluids.

## 2013-07-25 NOTE — Telephone Encounter (Signed)
Per staff message and POF I have scheduled appts.  JMW  

## 2013-07-25 NOTE — Patient Instructions (Addendum)
[facility] Discharge Instructions for Patients Receiving IVF Today you received the following IVF.   To help prevent nausea and vomiting after your treatment, we encourage you to take your nausea medication Zofran as prescribed by Dr. Darrold Span.  If you develop nausea and vomiting that is not controlled by your nausea medication, call the clinic.   BELOW ARE SYMPTOMS THAT SHOULD BE REPORTED IMMEDIATELY:  *FEVER GREATER THAN 100.5 F  *CHILLS WITH OR WITHOUT FEVER  NAUSEA AND VOMITING THAT IS NOT CONTROLLED WITH YOUR NAUSEA MEDICATION  *UNUSUAL SHORTNESS OF BREATH  *UNUSUAL BRUISING OR BLEEDING  TENDERNESS IN MOUTH AND THROAT WITH OR WITHOUT PRESENCE OF ULCERS  *URINARY PROBLEMS  *BOWEL PROBLEMS  UNUSUAL RASH Items with * indicate a potential emergency and should be followed up as soon as possible.  Feel free to call the clinic you have any questions or concerns. The clinic phone number is [phone].

## 2013-07-26 ENCOUNTER — Other Ambulatory Visit: Payer: Self-pay | Admitting: Oncology

## 2013-07-26 ENCOUNTER — Encounter: Payer: Self-pay | Admitting: Oncology

## 2013-07-26 DIAGNOSIS — C561 Malignant neoplasm of right ovary: Secondary | ICD-10-CM

## 2013-07-26 MED ORDER — SODIUM CHLORIDE 0.9 % IV SOLN: INTRAVENOUS | Status: DC

## 2013-07-30 ENCOUNTER — Other Ambulatory Visit: Payer: Self-pay | Admitting: Oncology

## 2013-07-31 ENCOUNTER — Other Ambulatory Visit: Payer: Medicare Other | Admitting: Lab

## 2013-07-31 ENCOUNTER — Ambulatory Visit: Payer: Medicare Other

## 2013-07-31 ENCOUNTER — Ambulatory Visit (HOSPITAL_BASED_OUTPATIENT_CLINIC_OR_DEPARTMENT_OTHER): Payer: Medicare Other

## 2013-07-31 ENCOUNTER — Other Ambulatory Visit (HOSPITAL_BASED_OUTPATIENT_CLINIC_OR_DEPARTMENT_OTHER): Payer: Medicare Other | Admitting: Lab

## 2013-07-31 VITALS — BP 141/80 | HR 72 | Temp 97.9°F | Resp 18

## 2013-07-31 DIAGNOSIS — C561 Malignant neoplasm of right ovary: Secondary | ICD-10-CM

## 2013-07-31 DIAGNOSIS — C569 Malignant neoplasm of unspecified ovary: Secondary | ICD-10-CM

## 2013-07-31 DIAGNOSIS — Z5111 Encounter for antineoplastic chemotherapy: Secondary | ICD-10-CM

## 2013-07-31 LAB — CBC WITH DIFFERENTIAL/PLATELET
BASO%: 0.2 % (ref 0.0–2.0)
HCT: 29.3 % — ABNORMAL LOW (ref 34.8–46.6)
HGB: 10 g/dL — ABNORMAL LOW (ref 11.6–15.9)
MCHC: 34.1 g/dL (ref 31.5–36.0)
MONO#: 0.1 10*3/uL (ref 0.1–0.9)
NEUT%: 82.7 % — ABNORMAL HIGH (ref 38.4–76.8)
WBC: 4.6 10*3/uL (ref 3.9–10.3)
lymph#: 0.7 10*3/uL — ABNORMAL LOW (ref 0.9–3.3)

## 2013-07-31 LAB — BUN AND CREATININE (CC13)
BUN: 14.7 mg/dL (ref 7.0–26.0)
Creatinine: 0.6 mg/dL (ref 0.6–1.1)

## 2013-07-31 MED ORDER — PACLITAXEL CHEMO INJECTION 300 MG/50ML
80.0000 mg/m2 | Freq: Once | INTRAVENOUS | Status: AC
  Administered 2013-07-31: 132 mg via INTRAVENOUS
  Filled 2013-07-31: qty 22

## 2013-07-31 MED ORDER — FAMOTIDINE IN NACL 20-0.9 MG/50ML-% IV SOLN
20.0000 mg | Freq: Once | INTRAVENOUS | Status: AC
  Administered 2013-07-31: 20 mg via INTRAVENOUS

## 2013-07-31 MED ORDER — DEXAMETHASONE SODIUM PHOSPHATE 20 MG/5ML IJ SOLN
20.0000 mg | Freq: Once | INTRAMUSCULAR | Status: AC
  Administered 2013-07-31: 20 mg via INTRAVENOUS

## 2013-07-31 MED ORDER — SODIUM CHLORIDE 0.9 % IJ SOLN
10.0000 mL | INTRAMUSCULAR | Status: DC | PRN
  Administered 2013-07-31: 10 mL
  Filled 2013-07-31: qty 10

## 2013-07-31 MED ORDER — HEPARIN SOD (PORK) LOCK FLUSH 100 UNIT/ML IV SOLN
500.0000 [IU] | Freq: Once | INTRAVENOUS | Status: AC | PRN
  Administered 2013-07-31: 500 [IU]
  Filled 2013-07-31: qty 5

## 2013-07-31 MED ORDER — SODIUM CHLORIDE 0.9 % IV SOLN
Freq: Once | INTRAVENOUS | Status: AC
  Administered 2013-07-31: 12:00:00 via INTRAVENOUS

## 2013-07-31 MED ORDER — SODIUM CHLORIDE 0.9 % IV SOLN
500.0000 mg | Freq: Once | INTRAVENOUS | Status: AC
  Administered 2013-07-31: 500 mg via INTRAVENOUS
  Filled 2013-07-31: qty 50

## 2013-07-31 MED ORDER — ONDANSETRON 16 MG/50ML IVPB (CHCC)
16.0000 mg | Freq: Once | INTRAVENOUS | Status: AC
  Administered 2013-07-31: 16 mg via INTRAVENOUS

## 2013-07-31 MED ORDER — DIPHENHYDRAMINE HCL 50 MG/ML IJ SOLN
25.0000 mg | Freq: Once | INTRAMUSCULAR | Status: AC
  Administered 2013-07-31: 25 mg via INTRAVENOUS

## 2013-07-31 NOTE — Patient Instructions (Addendum)
[facility] Discharge Instructions for Patients Receiving Chemotherapy  Today you received the following chemotherapy agents: Taxol, Carboplatin   To help prevent nausea and vomiting after your treatment, we encourage you to take your nausea medication as directed by your physician.   If you develop nausea and vomiting that is not controlled by your nausea medication, call the clinic.   BELOW ARE SYMPTOMS THAT SHOULD BE REPORTED IMMEDIATELY:  *FEVER GREATER THAN 100.5 F  *CHILLS WITH OR WITHOUT FEVER  NAUSEA AND VOMITING THAT IS NOT CONTROLLED WITH YOUR NAUSEA MEDICATION  *UNUSUAL SHORTNESS OF BREATH  *UNUSUAL BRUISING OR BLEEDING  TENDERNESS IN MOUTH AND THROAT WITH OR WITHOUT PRESENCE OF ULCERS  *URINARY PROBLEMS  *BOWEL PROBLEMS  UNUSUAL RASH Items with * indicate a potential emergency and should be followed up as soon as possible.  Feel free to call the clinic you have any questions or concerns. The clinic phone number is [phone].

## 2013-08-01 ENCOUNTER — Ambulatory Visit (HOSPITAL_BASED_OUTPATIENT_CLINIC_OR_DEPARTMENT_OTHER): Payer: Medicare Other

## 2013-08-01 ENCOUNTER — Other Ambulatory Visit: Payer: Self-pay | Admitting: Oncology

## 2013-08-01 ENCOUNTER — Ambulatory Visit: Payer: Medicare Other

## 2013-08-01 VITALS — BP 141/76 | HR 71 | Temp 98.2°F | Resp 16

## 2013-08-01 DIAGNOSIS — C569 Malignant neoplasm of unspecified ovary: Secondary | ICD-10-CM

## 2013-08-01 DIAGNOSIS — R971 Elevated cancer antigen 125 [CA 125]: Secondary | ICD-10-CM

## 2013-08-01 DIAGNOSIS — Z5189 Encounter for other specified aftercare: Secondary | ICD-10-CM

## 2013-08-01 DIAGNOSIS — C561 Malignant neoplasm of right ovary: Secondary | ICD-10-CM

## 2013-08-01 MED ORDER — SODIUM CHLORIDE 0.9 % IV SOLN
INTRAVENOUS | Status: DC
  Administered 2013-08-01: 11:00:00 via INTRAVENOUS

## 2013-08-01 MED ORDER — HEPARIN SOD (PORK) LOCK FLUSH 100 UNIT/ML IV SOLN
500.0000 [IU] | Freq: Once | INTRAVENOUS | Status: AC
  Administered 2013-08-01: 500 [IU] via INTRAVENOUS
  Filled 2013-08-01: qty 5

## 2013-08-01 MED ORDER — SODIUM CHLORIDE 0.9 % IJ SOLN
10.0000 mL | INTRAMUSCULAR | Status: DC | PRN
  Administered 2013-08-01: 10 mL via INTRAVENOUS
  Filled 2013-08-01: qty 10

## 2013-08-01 MED ORDER — FILGRASTIM 300 MCG/0.5ML IJ SOLN
300.0000 ug | Freq: Once | INTRAMUSCULAR | Status: AC
  Administered 2013-08-01: 300 ug via SUBCUTANEOUS
  Filled 2013-08-01: qty 0.5

## 2013-08-01 NOTE — Patient Instructions (Addendum)
Dehydration, Adult  Dehydration means your body does not have as much fluid as it needs. Your kidneys, brain, and heart will not work properly without the right amount of fluids and salt.   HOME CARE   Ask your doctor how to replace body fluid losses (rehydrate).   Drink enough fluids to keep your pee (urine) clear or pale yellow.   Drink small amounts of fluids often if you feel sick to your stomach (nauseous) or throw up (vomit).   Eat like you normally do.   Avoid:   Foods or drinks high in sugar.   Bubbly (carbonated) drinks.   Juice.   Very hot or cold fluids.   Drinks with caffeine.   Fatty, greasy foods.   Alcohol.   Tobacco.   Eating too much.   Gelatin desserts.   Wash your hands to avoid spreading germs (bacteria, viruses).   Only take medicine as told by your doctor.   Keep all doctor visits as told.  GET HELP RIGHT AWAY IF:    You cannot drink something without throwing up.   You get worse even with treatment.   Your vomit has blood in it or looks greenish.   Your poop (stool) has blood in it or looks black and tarry.   You have not peed in 6 to 8 hours.   You pee a small amount of very dark pee.   You have a fever.   You pass out (faint).   You have belly (abdominal) pain that gets worse or stays in one spot (localizes).   You have a rash, stiff neck, or bad headache.   You get easily annoyed, sleepy, or are hard to wake up.   You feel weak, dizzy, or very thirsty.  MAKE SURE YOU:    Understand these instructions.   Will watch your condition.   Will get help right away if you are not doing well or get worse.  Document Released: 09/11/2009 Document Revised: 02/07/2012 Document Reviewed: 07/05/2011  Exit[employer] Patient Information 2014 [employer].

## 2013-08-02 ENCOUNTER — Other Ambulatory Visit: Payer: Self-pay | Admitting: Oncology

## 2013-08-02 ENCOUNTER — Ambulatory Visit (HOSPITAL_BASED_OUTPATIENT_CLINIC_OR_DEPARTMENT_OTHER): Payer: Medicare Other

## 2013-08-02 VITALS — BP 127/62 | HR 76 | Temp 98.1°F

## 2013-08-02 DIAGNOSIS — C569 Malignant neoplasm of unspecified ovary: Secondary | ICD-10-CM

## 2013-08-02 DIAGNOSIS — Z5189 Encounter for other specified aftercare: Secondary | ICD-10-CM

## 2013-08-02 DIAGNOSIS — R971 Elevated cancer antigen 125 [CA 125]: Secondary | ICD-10-CM

## 2013-08-02 DIAGNOSIS — C561 Malignant neoplasm of right ovary: Secondary | ICD-10-CM

## 2013-08-02 MED ORDER — FILGRASTIM 300 MCG/0.5ML IJ SOLN
300.0000 ug | Freq: Once | INTRAMUSCULAR | Status: AC
  Administered 2013-08-02: 300 ug via SUBCUTANEOUS
  Filled 2013-08-02: qty 0.5

## 2013-08-03 ENCOUNTER — Telehealth: Payer: Self-pay

## 2013-08-03 NOTE — Telephone Encounter (Signed)
[name] called stating that she cannot eat right now.  She had some toast this am.  No vomiting Able to drink fluids. Encouraged to try and get in 64 oz. A day.  Suggested she try the ativan which she has not taken.  Used zofran.  Recommended trying toast or broth later today after taking ativan. Suggested smoothies with protein mixed in. Small amounts of food frequently when she feels she can take food. [name] experiencing som aches in her legs which may be from the neupogen and taxol.  Temp 99..4 last evening and now 98.3.  No urinary symptoms or chest congestion.  Told her to call if temp. >101.5 or take temp if she has a shaking chill.Slight elevation could be from neupogen as well.

## 2013-08-05 ENCOUNTER — Other Ambulatory Visit: Payer: Self-pay | Admitting: Oncology

## 2013-08-05 DIAGNOSIS — C561 Malignant neoplasm of right ovary: Secondary | ICD-10-CM

## 2013-08-06 ENCOUNTER — Ambulatory Visit (HOSPITAL_BASED_OUTPATIENT_CLINIC_OR_DEPARTMENT_OTHER): Payer: Medicare Other | Admitting: Oncology

## 2013-08-06 ENCOUNTER — Telehealth: Payer: Self-pay | Admitting: Oncology

## 2013-08-06 ENCOUNTER — Other Ambulatory Visit (HOSPITAL_BASED_OUTPATIENT_CLINIC_OR_DEPARTMENT_OTHER): Payer: Medicare Other | Admitting: Lab

## 2013-08-06 ENCOUNTER — Ambulatory Visit: Payer: Medicare Other | Admitting: Oncology

## 2013-08-06 VITALS — BP 125/71 | HR 73 | Temp 97.7°F | Resp 18 | Ht 62.0 in | Wt 137.4 lb

## 2013-08-06 DIAGNOSIS — C561 Malignant neoplasm of right ovary: Secondary | ICD-10-CM

## 2013-08-06 DIAGNOSIS — C569 Malignant neoplasm of unspecified ovary: Secondary | ICD-10-CM

## 2013-08-06 DIAGNOSIS — R971 Elevated cancer antigen 125 [CA 125]: Secondary | ICD-10-CM

## 2013-08-06 DIAGNOSIS — Z5189 Encounter for other specified aftercare: Secondary | ICD-10-CM

## 2013-08-06 LAB — CBC WITH DIFFERENTIAL/PLATELET
BASO%: 1.1 % (ref 0.0–2.0)
MCHC: 33.7 g/dL (ref 31.5–36.0)
MONO#: 0.2 10*3/uL (ref 0.1–0.9)
RBC: 2.9 10*6/uL — ABNORMAL LOW (ref 3.70–5.45)
RDW: 15.5 % — ABNORMAL HIGH (ref 11.2–14.5)
WBC: 2.8 10*3/uL — ABNORMAL LOW (ref 3.9–10.3)
lymph#: 1.3 10*3/uL (ref 0.9–3.3)
nRBC: 0 % (ref 0–0)

## 2013-08-06 MED ORDER — FILGRASTIM 300 MCG/0.5ML IJ SOLN
300.0000 ug | Freq: Once | INTRAMUSCULAR | Status: AC
  Administered 2013-08-06: 300 ug via SUBCUTANEOUS
  Filled 2013-08-06: qty 0.5

## 2013-08-06 NOTE — Progress Notes (Signed)
OFFICE PROGRESS NOTE   08/06/2013   Physicians: D. ClarkePearson; J.Russo, T.Fontaine, [name], [name]. Last gastroenterologist in [place].   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 She tells me that she felt badly all of last week, with significant fatigue, decreased po intake and even frank nausea on 08-03-13. She did have IVF on 9-3 and neupogen on 9-3 and 9-4; SL ativan as suggested by RN on 9-5 was very helpful. She feels some better today, including improvement in po liquids and food. Bowels have been moving and she has not had temperature higher than 99 (on 9-5) and no bladder symptoms or other clear symptoms of infection.    I have LM for [name] re course to date, with cycle 2 day 8 held (syncope, dehydration, hospitalization, UTI then a fib), cycle 6 day 8 held for counts 9-9, some other delays including cycle 4 day 8. I have not scheduled additional "make up" treatments beyond planned day 15 cycle 6 on 07-30-13 but would consider this if [name] requests.  ONCOLOGIC HISTORY She had ~ 2 weeks vaginal spotting in late Dec 2013, then presented to [name] in late Jan 2014 after bright red spotting that AM, with uterus larger than had been apparent on exam in Oct 2013. Sonohystogram 01-05-13 showed uterus normal size and echotexture, endometrium 4.3 mm, left ovary normal and right adnexa with 1.1 x 8.4.x8.2 cm cystic and solid mass. Endometrial biopsy benign and CA 125 also on 01-05-13 was 178.8. She had CT AP 01-17-13 with 1.0 x 6.9 x 8.9 cm complex right ovarian mass, no ascites, small retroperitoneal nodes. She was seen by [name] on 01-18-13 and taken to surgery by [name] on 02-13-13, which was TAH/BSO/ omentectomy/ureterolysis/ resection  of cul-de-sac tumor/right pelvic lymphadnectomy and resection of rectum with reanastomosis. At completion of surgery there was no gross residual disease. 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 Washings [phone]) had rare clusters of atypical cells. Chemotherapy with dose dense taxol carboplatin was begun day 1 cycle 1 on 03-20-13. She was hospitalized x2 during cycle 2, and has required neupogen and additional IVF after each weekly treatment.    Review of systems as above, also: No increase in peripheral neuropathy. No problems with PAC. No LE swelling, no bleeding, no SOB at rest, no new or different pain. Remainder of 10 point Review of Systems negative.  Objective:  Vital signs in last 24 hours:  BP 125/71  Pulse 73  Temp(Src) 97.7 F (36.5 C) (Oral)  Resp 18  Ht 5\' 2"  (1.575 m)  Wt 137 lb 6.4 oz (62.324 kg)  BMI 25.12 kg/m2 Weight is down 3 lbs from last visit. Alert, oriented and appropriate. Ambulatory without assistance.  Complete alopecia  HEENT:PERRL, sclerae not icteric. Oral mucosa moist without lesions, posterior pharynx clear. Neck supple.  Lymphatics no cervical or suraclavicular adenopathy Resp: clear to auscultation bilaterally Cardio: regular rate and rhythm. No gallop. GI: soft, nontender, not distended, no mass or organomegaly. Surgical incision not remarkable. Few BS. Extremities: without pitting edema, cords, tenderness Neuro: unchanged Skin without rash, ecchymosis, petechiae  Breasts: without dominant mass, skin or nipple findings. Axilae benign. Portacath-without erythema or tenderness  Lab Results:  Results for orders placed in visit on 08/06/13  CBC WITH DIFFERENTIAL      Result Value Range    WBC 2.8 (*) 3.9 - 10.3 10e3/uL   NEUT# 1.2 (*) 1.5 - 6.5 10e3/uL   HGB 9.4 (*) 11.6 - 15.9 g/dL   HCT 40.9 (*) 81.1 - 91.4 %   Platelets 81 (*) 145 - 400 10e3/uL   MCV 96.2  79.5 - 101.0 fL   MCH 32.4  25.1 - 34.0 pg   MCHC 33.7  31.5 - 36.0 g/dL   RBC 7.82 (*) 9.56 - 2.13 10e6/uL   RDW 15.5 (*) 11.2 - 14.5 %   lymph# 1.3  0.9 - 3.3 10e3/uL   MONO# 0.2  0.1 - 0.9 10e3/uL   Eosinophils Absolute 0.0  0.0 - 0.5 10e3/uL   Basophils Absolute 0.0  0.0 - 0.1 10e3/uL   NEUT% 44.6  38.4 - 76.8 %   LYMPH% 46.4  14.0 - 49.7 %   MONO% 7.2  0.0 - 14.0 %   EOS% 0.7  0.0 - 7.0 %   BASO% 1.1  0.0 - 2.0 %   nRBC 0  0 - 0 %     Studies/Results:  No results found.  Medications: I have reviewed the patient's current medications. She has been given neupogen today.   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  Day 8 cycle 6 will be held on 9-9 due to ANC 1.2 and plt 81k; neupogen given now. She will not have neupogen or IVF on 9-10. I will see her back with counts on 9-15 prior to day 15 cycle 6 on 9-16. I have sent message to [name] re total treatments administered as above; I expect that we will not try to make up the 2 taxol days missed. She will have restaging CT AP after chemo completes, shortly prior to visit back to gyn oncology 09-28-13.  2.HTN, elevated lipids, one episode of paroxysmal Afib. On pradaxa and ASA. HCTZ and losartan held since 04-09-13 and hydralazine now prn per Dr Timothy Lasso 3.hypothyroidism on replacement  4.osteoarthritis, post right hip replacement 2013  5. Previous UTI. Negative urine culture 07-16-13.   6. PAC in  7. supplemental K decreased as she is now off HCTZ   Patient understood and agreed with plan above. She  will call if other problems or concerns before next visit.        Venda Dice P, MD   08/06/2013, 11:57 AM

## 2013-08-06 NOTE — Patient Instructions (Signed)
No chemo this week, no neupogen this week after today's injection. IV fluids also cancelled this week, but let us know if you cannot take in fluids adequately or if you are lightheaded. Let us know if any symptoms of UTI

## 2013-08-07 ENCOUNTER — Encounter: Payer: Self-pay | Admitting: Oncology

## 2013-08-07 ENCOUNTER — Ambulatory Visit: Payer: Medicare Other

## 2013-08-08 ENCOUNTER — Ambulatory Visit: Payer: Medicare Other

## 2013-08-08 ENCOUNTER — Ambulatory Visit: Payer: Medicare Other | Admitting: Oncology

## 2013-08-12 ENCOUNTER — Other Ambulatory Visit: Payer: Self-pay | Admitting: Oncology

## 2013-08-13 ENCOUNTER — Ambulatory Visit (HOSPITAL_BASED_OUTPATIENT_CLINIC_OR_DEPARTMENT_OTHER): Payer: Medicare Other | Admitting: Oncology

## 2013-08-13 ENCOUNTER — Encounter: Payer: Self-pay | Admitting: Oncology

## 2013-08-13 ENCOUNTER — Other Ambulatory Visit (HOSPITAL_BASED_OUTPATIENT_CLINIC_OR_DEPARTMENT_OTHER): Payer: Medicare Other | Admitting: Lab

## 2013-08-13 ENCOUNTER — Telehealth: Payer: Self-pay | Admitting: Oncology

## 2013-08-13 VITALS — BP 129/58 | HR 68 | Temp 97.9°F | Resp 20 | Ht 62.0 in | Wt 139.3 lb

## 2013-08-13 DIAGNOSIS — C561 Malignant neoplasm of right ovary: Secondary | ICD-10-CM

## 2013-08-13 DIAGNOSIS — C569 Malignant neoplasm of unspecified ovary: Secondary | ICD-10-CM

## 2013-08-13 LAB — CBC WITH DIFFERENTIAL/PLATELET
Basophils Absolute: 0 10*3/uL (ref 0.0–0.1)
EOS%: 1.4 % (ref 0.0–7.0)
Eosinophils Absolute: 0 10*3/uL (ref 0.0–0.5)
HGB: 9.7 g/dL — ABNORMAL LOW (ref 11.6–15.9)
MCH: 32.7 pg (ref 25.1–34.0)
NEUT#: 1 10*3/uL — ABNORMAL LOW (ref 1.5–6.5)
RDW: 17 % — ABNORMAL HIGH (ref 11.2–14.5)
WBC: 2.9 10*3/uL — ABNORMAL LOW (ref 3.9–10.3)
lymph#: 1.4 10*3/uL (ref 0.9–3.3)

## 2013-08-13 NOTE — Progress Notes (Signed)
OFFICE PROGRESS NOTE   08/13/2013   Physicians:D. ClarkePearson; J.Russo, T.Fontaine, [name], [name]. Last gastroenterologist in [place].   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 Day 8 cycle 6 was held on 08-06-13 due to platelets 81k, with day 15 cycle 6 due 08-14-13. Patient had been "wiped out" from the prior chemo (day 1 cycle 6 on 07-31-13) until end of last week, with improvement in energy and overall condition daily since then. She has had no fever or symptoms of infection and no bleeding.  She has PAC in. We have discussed keeping this flushed every 6-8 weeks when not otherwise used.  She has not had flu vaccine, which she will need after chemo completes, probably sometime in Oct.   ONCOLOGIC HISTORY She had ~ 2 weeks vaginal spotting in late Dec 2013, then presented to [name] in late Jan 2014 after bright red spotting that AM, with uterus larger than had been apparent on exam in Oct 2013. Sonohystogram 01-05-13 showed uterus normal size and echotexture, endometrium 4.3 mm, left ovary normal and right adnexa with 1.1 x 8.4.x8.2 cm cystic and solid mass. Endometrial biopsy benign and CA 125 also on 01-05-13 was 178.8. She had CT AP 01-17-13 with 1.0 x 6.9 x 8.9 cm complex right ovarian mass, no ascites, small retroperitoneal nodes. She was seen by [name] on 01-18-13 and taken to surgery by [name] on 02-13-13, which was TAH/BSO/ omentectomy/ureterolysis/ resection of cul-de-sac tumor/right pelvic lymphadnectomy and resection of rectum with reanastomosis. At completion of surgery there was no gross residual disease. 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 Washings [phone]) had rare clusters of atypical cells. Chemotherapy with dose dense taxol carboplatin was begun day 1 cycle 1 on 03-20-13; ANC was 1.1 on day 15 cycle 1 with taxol given and neupogen added 04-04-13. She had day 1 cycle 2 treatment on 04-17-13, then was briefly hospitalized 5-22 to 04-20-13 after syncopal episode, with antihypertensive agents DCd and UTI treated. She was feeling much better at time of "day 8" cycle 2 on 05-01-13 and did have neupogen 300 mcg x 1 dose on 05-02-13. She was readmitted to hospital 6-5 thru 05-05-13 with fever, empirically on antibiotics and blood cultures negative. She then went on planned beach vacation such that day 1 cycle 3 was administered on 05-22-13, with neupogen on 6-25 and 6-26 with IVF also on day 2. Day 8 cycle 4 was delayed a week until 06-26-13 due to low ANC.   Review of systems as above, also: No nausea or vomiting now, taking po's including liquids well now. No orthostatic symptoms. No peripheral neuropathy. No SOB walking into office today. No LE swelling. No problems with PAC. Bowels moving mostly daily with present laxatives. No abdominal or pelvic pain. No bladder symptoms suggesting any infection.  Remainder of 10 point Review of Systems negative.  Objective:  Vital signs in last 24 hours:  BP 129/58  Pulse 68  Temp(Src) 97.9 F (36.6 C) (Oral)  Resp 20  Ht 5\' 2"  (1.575 m)  Wt 139 lb 4.8 oz (63.186 kg)  BMI 25.47 kg/m2  Alert, oriented and appropriate. Ambulatory without difficulty.  Alopecia. Looks comfortable today.  HEENT:PERRL, sclerae not icteric. Oral mucosa moist without lesions, posterior pharynx clear.  Neck supple. No JVD.  Lymphatics no cervical,suraclavicular adenopathy Resp: clear to auscultation bilaterally and normal percussion bilaterally Cardio: regular rate and rhythm. No gallop. GI: soft,  nontender, not distended, no mass or organomegaly. Surgical incision not remarkable. Extremities: without pitting edema, cords, tenderness Neuro: no peripheral neuropathy. Otherwise nonfocal Skin without rash, ecchymosis, petechiae Portacath-without erythema or tenderness  Lab Results:  Results for orders placed in visit on 08/13/13  CBC WITH DIFFERENTIAL      Result Value Range   WBC 2.9 (*) 3.9 - 10.3 10e3/uL   NEUT# 1.0 (*) 1.5 - 6.5 10e3/uL   HGB 9.7 (*) 11.6 - 15.9 g/dL   HCT 82.9 (*) 56.2 - 13.0 %   Platelets 114 (*) 145 - 400 10e3/uL   MCV 98.7  79.5 - 101.0 fL   MCH 32.7  25.1 - 34.0 pg   MCHC 33.1  31.5 - 36.0 g/dL   RBC 8.65 (*) 7.84 - 6.96 10e6/uL   RDW 17.0 (*) 11.2 - 14.5 %   lymph# 1.4  0.9 - 3.3 10e3/uL   MONO# 0.5  0.1 - 0.9 10e3/uL   Eosinophils Absolute 0.0  0.0 - 0.5 10e3/uL   Basophils Absolute 0.0  0.0 - 0.1 10e3/uL   NEUT% 34.8 (*) 38.4 - 76.8 %   LYMPH% 46.8  14.0 - 49.7 %   MONO% 16.7 (*) 0.0 - 14.0 %   EOS% 1.4  0.0 - 7.0 %   BASO% 0.3  0.0 - 2.0 %   nRBC 0  0 - 0 %    CMET and Ca 125 last on 07-24-13  Counts discussed. As low dose taxol does not markedly drop counts and as she is willing to have neupogen for at least 2 doses after chemo, we will do last planned treatment with day 15 cycle 6 on 08-14-13.  Studies/Results:  No results found. CT AP scheduled for 09-25-13  Medications: I have reviewed the patient's current medications. 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 Day 15 cycle 6 will be given 08-14-13. She will continue neupogen with IVF on 9-17 and neupogen on 9-18 . I will see her back ~ 08-22-13  with CBC then. She will have restaging CT AP after chemo completes, shortly prior to visit back to gyn oncology 09-28-13.  2.HTN, elevated lipids, one episode of paroxysmal Afib. On pradaxa and ASA. HCTZ and losartan held since 04-09-13 and hydralazine now prn.  3.hypothyroidism on replacement  4.osteoarthritis, post right hip replacement 2013  5. Previous UTI.No symptoms presently 6. PAC in: will   7. K+ 4.0 on labs 07-24-13, off HCTZ: patient very concerned about K dropping and is still taking KCl 10 mEq MWF (creatinine 0.7)    Patient has had questions answered and is in agreement with plan above.    Mahmood Boehringer P, MD   08/13/2013, 11:11 AM

## 2013-08-13 NOTE — Progress Notes (Signed)
Medical Oncology  Reviewed chemo treatment course with [name]. He agrees with stopping after treatment this week, without making up the two single taxol treatments that were missed during the course. Patient is in full agreement with this plan. Ila Mcgill, MD

## 2013-08-13 NOTE — Telephone Encounter (Signed)
, °

## 2013-08-14 ENCOUNTER — Ambulatory Visit (HOSPITAL_BASED_OUTPATIENT_CLINIC_OR_DEPARTMENT_OTHER): Payer: Medicare Other

## 2013-08-14 VITALS — BP 151/82 | HR 67 | Temp 97.5°F

## 2013-08-14 DIAGNOSIS — C561 Malignant neoplasm of right ovary: Secondary | ICD-10-CM

## 2013-08-14 DIAGNOSIS — Z5111 Encounter for antineoplastic chemotherapy: Secondary | ICD-10-CM

## 2013-08-14 DIAGNOSIS — C569 Malignant neoplasm of unspecified ovary: Secondary | ICD-10-CM

## 2013-08-14 MED ORDER — DEXAMETHASONE SODIUM PHOSPHATE 20 MG/5ML IJ SOLN
20.0000 mg | Freq: Once | INTRAMUSCULAR | Status: AC
  Administered 2013-08-14: 20 mg via INTRAVENOUS

## 2013-08-14 MED ORDER — ONDANSETRON 8 MG/50ML IVPB (CHCC)
8.0000 mg | Freq: Once | INTRAVENOUS | Status: AC
  Administered 2013-08-14: 8 mg via INTRAVENOUS

## 2013-08-14 MED ORDER — SODIUM CHLORIDE 0.9 % IV SOLN
Freq: Once | INTRAVENOUS | Status: AC
  Administered 2013-08-14: 10:00:00 via INTRAVENOUS

## 2013-08-14 MED ORDER — SODIUM CHLORIDE 0.9 % IJ SOLN
10.0000 mL | INTRAMUSCULAR | Status: DC | PRN
  Administered 2013-08-14: 10 mL
  Filled 2013-08-14: qty 10

## 2013-08-14 MED ORDER — FAMOTIDINE IN NACL 20-0.9 MG/50ML-% IV SOLN
20.0000 mg | Freq: Once | INTRAVENOUS | Status: AC
  Administered 2013-08-14: 20 mg via INTRAVENOUS

## 2013-08-14 MED ORDER — DIPHENHYDRAMINE HCL 50 MG/ML IJ SOLN
INTRAMUSCULAR | Status: AC
  Filled 2013-08-14: qty 25

## 2013-08-14 MED ORDER — HEPARIN SOD (PORK) LOCK FLUSH 100 UNIT/ML IV SOLN
500.0000 [IU] | Freq: Once | INTRAVENOUS | Status: AC | PRN
  Administered 2013-08-14: 500 [IU]
  Filled 2013-08-14: qty 5

## 2013-08-14 MED ORDER — FAMOTIDINE IN NACL 20-0.9 MG/50ML-% IV SOLN
INTRAVENOUS | Status: AC
  Filled 2013-08-14: qty 50

## 2013-08-14 MED ORDER — DEXAMETHASONE SODIUM PHOSPHATE 20 MG/5ML IJ SOLN
INTRAMUSCULAR | Status: AC
  Filled 2013-08-14: qty 5

## 2013-08-14 MED ORDER — PACLITAXEL CHEMO INJECTION 300 MG/50ML
80.0000 mg/m2 | Freq: Once | INTRAVENOUS | Status: AC
  Administered 2013-08-14: 132 mg via INTRAVENOUS
  Filled 2013-08-14: qty 22

## 2013-08-14 MED ORDER — ONDANSETRON 8 MG/NS 50 ML IVPB
INTRAVENOUS | Status: AC
  Filled 2013-08-14: qty 8

## 2013-08-14 MED ORDER — DIPHENHYDRAMINE HCL 50 MG/ML IJ SOLN
25.0000 mg | Freq: Once | INTRAMUSCULAR | Status: AC
  Administered 2013-08-14: 25 mg via INTRAVENOUS

## 2013-08-14 NOTE — Patient Instructions (Signed)
[facility] Discharge Instructions for Patients Receiving Chemotherapy  Today you received the following chemotherapy agents: Taxol.  To help prevent nausea and vomiting after your treatment, we encourage you to take your nausea medication: Zofran 8 mg every 12 hours as needed.   If you develop nausea and vomiting that is not controlled by your nausea medication, call the clinic.   BELOW ARE SYMPTOMS THAT SHOULD BE REPORTED IMMEDIATELY:  *FEVER GREATER THAN 100.5 F  *CHILLS WITH OR WITHOUT FEVER  NAUSEA AND VOMITING THAT IS NOT CONTROLLED WITH YOUR NAUSEA MEDICATION  *UNUSUAL SHORTNESS OF BREATH  *UNUSUAL BRUISING OR BLEEDING  TENDERNESS IN MOUTH AND THROAT WITH OR WITHOUT PRESENCE OF ULCERS  *URINARY PROBLEMS  *BOWEL PROBLEMS  UNUSUAL RASH Items with * indicate a potential emergency and should be followed up as soon as possible.  Feel free to call the clinic you have any questions or concerns. The clinic phone number is [phone].    

## 2013-08-15 ENCOUNTER — Ambulatory Visit (HOSPITAL_BASED_OUTPATIENT_CLINIC_OR_DEPARTMENT_OTHER): Payer: Medicare Other

## 2013-08-15 ENCOUNTER — Telehealth: Payer: Self-pay | Admitting: Oncology

## 2013-08-15 ENCOUNTER — Ambulatory Visit: Payer: Medicare Other

## 2013-08-15 VITALS — BP 120/63 | HR 66 | Temp 98.5°F | Resp 18

## 2013-08-15 DIAGNOSIS — C569 Malignant neoplasm of unspecified ovary: Secondary | ICD-10-CM

## 2013-08-15 DIAGNOSIS — R1900 Intra-abdominal and pelvic swelling, mass and lump, unspecified site: Secondary | ICD-10-CM

## 2013-08-15 DIAGNOSIS — Z5189 Encounter for other specified aftercare: Secondary | ICD-10-CM

## 2013-08-15 DIAGNOSIS — R971 Elevated cancer antigen 125 [CA 125]: Secondary | ICD-10-CM

## 2013-08-15 DIAGNOSIS — C561 Malignant neoplasm of right ovary: Secondary | ICD-10-CM

## 2013-08-15 MED ORDER — SODIUM CHLORIDE 0.9 % IV SOLN
INTRAVENOUS | Status: DC
  Administered 2013-08-15: 11:00:00 via INTRAVENOUS

## 2013-08-15 MED ORDER — FILGRASTIM 300 MCG/0.5ML IJ SOLN
300.0000 ug | Freq: Once | INTRAMUSCULAR | Status: AC
  Administered 2013-08-15: 300 ug via SUBCUTANEOUS
  Filled 2013-08-15: qty 0.5

## 2013-08-15 NOTE — Telephone Encounter (Signed)
, °

## 2013-08-15 NOTE — Patient Instructions (Signed)
Dehydration, Adult Dehydration is when you lose more fluids from the body than you take in. Vital organs like the kidneys, brain, and heart cannot function without a proper amount of fluids and salt. Any loss of fluids from the body can cause dehydration.  CAUSES   Vomiting.  Diarrhea.  Excessive sweating.  Excessive urine output.  Fever. SYMPTOMS  Mild dehydration  Thirst.  Dry lips.  Slightly dry mouth. Moderate dehydration  Very dry mouth.  Sunken eyes.  Skin does not bounce back quickly when lightly pinched and released.  Dark urine and decreased urine production.  Decreased tear production.  Headache. Severe dehydration  Very dry mouth.  Extreme thirst.  Rapid, weak pulse (more than 100 beats per minute at rest).  Cold hands and feet.  Not able to sweat in spite of heat and temperature.  Rapid breathing.  Blue lips.  Confusion and lethargy.  Difficulty being awakened.  Minimal urine production.  No tears. DIAGNOSIS  Your caregiver will diagnose dehydration based on your symptoms and your exam. Blood and urine tests will help confirm the diagnosis. The diagnostic evaluation should also identify the cause of dehydration. TREATMENT  Treatment of mild or moderate dehydration can often be done at home by increasing the amount of fluids that you drink. It is best to drink small amounts of fluid more often. Drinking too much at one time can make vomiting worse. Refer to the home care instructions below. Severe dehydration needs to be treated at the hospital where you will probably be given intravenous (IV) fluids that contain water and electrolytes. HOME CARE INSTRUCTIONS   Ask your caregiver about specific rehydration instructions.  Drink enough fluids to keep your urine clear or pale yellow.  Drink small amounts frequently if you have nausea and vomiting.  Eat as you normally do.  Avoid:  Foods or drinks high in sugar.  Carbonated  drinks.  Juice.  Extremely hot or cold fluids.  Drinks with caffeine.  Fatty, greasy foods.  Alcohol.  Tobacco.  Overeating.  Gelatin desserts.  Wash your hands well to avoid spreading bacteria and viruses.  Only take over-the-counter or prescription medicines for pain, discomfort, or fever as directed by your caregiver.  Ask your caregiver if you should continue all prescribed and over-the-counter medicines.  Keep all follow-up appointments with your caregiver. SEEK MEDICAL CARE IF:  You have abdominal pain and it increases or stays in one area (localizes).  You have a rash, stiff neck, or severe headache.  You are irritable, sleepy, or difficult to awaken.  You are weak, dizzy, or extremely thirsty. SEEK IMMEDIATE MEDICAL CARE IF:   You are unable to keep fluids down or you get worse despite treatment.  You have frequent episodes of vomiting or diarrhea.  You have blood or green matter (bile) in your vomit.  You have blood in your stool or your stool looks black and tarry.  You have not urinated in 6 to 8 hours, or you have only urinated a small amount of very dark urine.  You have a fever.  You faint. MAKE SURE YOU:   Understand these instructions.  Will watch your condition.  Will get help right away if you are not doing well or get worse. Document Released: 11/15/2005 Document Revised: 02/07/2012 Document Reviewed: 07/05/2011 [employer] Patient Information 2014 [employer].  

## 2013-08-16 ENCOUNTER — Ambulatory Visit (HOSPITAL_BASED_OUTPATIENT_CLINIC_OR_DEPARTMENT_OTHER): Payer: Medicare Other

## 2013-08-16 VITALS — BP 139/65 | HR 69 | Temp 97.3°F

## 2013-08-16 DIAGNOSIS — Z5189 Encounter for other specified aftercare: Secondary | ICD-10-CM

## 2013-08-16 DIAGNOSIS — R971 Elevated cancer antigen 125 [CA 125]: Secondary | ICD-10-CM

## 2013-08-16 DIAGNOSIS — C569 Malignant neoplasm of unspecified ovary: Secondary | ICD-10-CM

## 2013-08-16 DIAGNOSIS — C561 Malignant neoplasm of right ovary: Secondary | ICD-10-CM

## 2013-08-16 MED ORDER — FILGRASTIM 300 MCG/0.5ML IJ SOLN
300.0000 ug | Freq: Once | INTRAMUSCULAR | Status: AC
  Administered 2013-08-16: 300 ug via SUBCUTANEOUS
  Filled 2013-08-16: qty 0.5

## 2013-08-16 NOTE — Patient Instructions (Signed)
Filgrastim, G-CSF injection What is this medicine? FILGRASTIM, G-CSF (fil GRA stim) stimulates the formation of white blood cells. This medicine is given to patients with conditions that may cause a decrease in white blood cells, like those receiving certain types of chemotherapy or bone marrow transplant. It helps the bone marrow recover its ability to produce white blood cells. Increasing the amount of white blood cells helps to decrease the risk of infection and fever. This medicine may be used for other purposes; ask your health care provider or pharmacist if you have questions. What should I tell my health care provider before I take this medicine? They need to know if you have any of these conditions: -currently receiving radiation therapy -sickle cell disease -an unusual or allergic reaction to filgrastim, E. coli protein, other medicines, foods, dyes, or preservatives -pregnant or trying to get pregnant -breast-feeding How should I use this medicine? This medicine is for injection into a vein or injection under the skin. It is usually given by a health care professional in a hospital or clinic setting. If you get this medicine at home, you will be taught how to prepare and give this medicine. Always change the site for the injection under the skin. Let the solution warm to room temperature before you use it. Do not shake the solution before you withdraw a dose. Throw away any unused portion. Use exactly as directed. Take your medicine at regular intervals. Do not take your medicine more often than directed. It is important that you put your used needles and syringes in a special sharps container. Do not put them in a trash can. If you do not have a sharps container, call your pharmacist or healthcare provider to get one. Talk to your pediatrician regarding the use of this medicine in children. While this medicine may be prescribed for children for selected conditions, precautions do  apply. Overdosage: If you think you have taken too much of this medicine contact a poison control center or emergency room at once. NOTE: This medicine is only for you. Do not share this medicine with others. What if I miss a dose? Try not to miss doses. If you miss a dose take the dose as soon as you remember. If it is almost time for the next dose, do not take double doses unless told to by your doctor or health care professional. What may interact with this medicine? -lithium -medicines for cancer chemotherapy This list may not describe all possible interactions. Give your health care provider a list of all the medicines, herbs, non-prescription drugs, or dietary supplements you use. Also tell them if you smoke, drink alcohol, or use illegal drugs. Some items may interact with your medicine. What should I watch for while using this medicine? Visit your doctor or health care professional for regular checks on your progress. If you get a fever or any sign of infection while you are using this medicine, do not treat yourself. Check with your doctor or health care professional. Bone pain can usually be relieved by mild pain relievers such as acetaminophen or ibuprofen. Check with your doctor or health care professional before taking these medicines as they may hide a fever. Call your doctor or health care professional if the aches and pains are severe or do not go away. What side effects may I notice from receiving this medicine? Side effects that you should report to your doctor or health care professional as soon as possible: -allergic reactions like skin rash, itching   or hives, swelling of the face, lips, or tongue -difficulty breathing, wheezing -fever -pain, redness, or swelling at the injection site -stomach or side pain, or pain at the shoulder Side effects that usually do not require medical attention (report to your doctor or health care professional if they continue or are  bothersome): -bone pain (ribs, lower back, breast bone) -headache -skin rash This list may not describe all possible side effects. Call your doctor for medical advice about side effects. You may report side effects to FDA at 1-800-FDA-1088. Where should I keep my medicine? Keep out of the reach of children. Store in a refrigerator between 2 and 8 degrees C (36 and 46 degrees F). Do not freeze or leave in direct sunlight. If vials or syringes are left out of the refrigerator for more than 24 hours, they must be thrown away. Throw away unused vials after the expiration date on the carton. NOTE: This sheet is a summary. It may not cover all possible information. If you have questions about this medicine, talk to your doctor, pharmacist, or health care provider.  2013, [employer]vier/[employer] Standard. (01/31/2008 1:33:21 PM)  

## 2013-08-22 ENCOUNTER — Ambulatory Visit (HOSPITAL_BASED_OUTPATIENT_CLINIC_OR_DEPARTMENT_OTHER): Payer: Medicare Other | Admitting: Oncology

## 2013-08-22 ENCOUNTER — Telehealth: Payer: Self-pay | Admitting: *Deleted

## 2013-08-22 ENCOUNTER — Other Ambulatory Visit (HOSPITAL_BASED_OUTPATIENT_CLINIC_OR_DEPARTMENT_OTHER): Payer: Medicare Other

## 2013-08-22 ENCOUNTER — Encounter: Payer: Self-pay | Admitting: Oncology

## 2013-08-22 VITALS — BP 148/75 | HR 65 | Temp 97.8°F | Resp 18 | Ht 62.0 in | Wt 139.2 lb

## 2013-08-22 DIAGNOSIS — R3989 Other symptoms and signs involving the genitourinary system: Secondary | ICD-10-CM

## 2013-08-22 DIAGNOSIS — R5383 Other fatigue: Secondary | ICD-10-CM

## 2013-08-22 DIAGNOSIS — R5381 Other malaise: Secondary | ICD-10-CM

## 2013-08-22 DIAGNOSIS — C569 Malignant neoplasm of unspecified ovary: Secondary | ICD-10-CM

## 2013-08-22 DIAGNOSIS — C561 Malignant neoplasm of right ovary: Secondary | ICD-10-CM

## 2013-08-22 DIAGNOSIS — D6481 Anemia due to antineoplastic chemotherapy: Secondary | ICD-10-CM

## 2013-08-22 DIAGNOSIS — N390 Urinary tract infection, site not specified: Secondary | ICD-10-CM

## 2013-08-22 LAB — URINALYSIS, MICROSCOPIC - CHCC
Bilirubin (Urine): NEGATIVE
Blood: NEGATIVE
Glucose: NEGATIVE mg/dL
Leukocyte Esterase: NEGATIVE
Nitrite: NEGATIVE
RBC / HPF: NEGATIVE (ref 0–2)
Urobilinogen, UR: 0.2 mg/dL (ref 0.2–1)
WBC, UA: NEGATIVE (ref 0–2)

## 2013-08-22 LAB — CBC WITH DIFFERENTIAL/PLATELET
Basophils Absolute: 0 10*3/uL (ref 0.0–0.1)
EOS%: 0.4 % (ref 0.0–7.0)
Eosinophils Absolute: 0 10*3/uL (ref 0.0–0.5)
HGB: 10.4 g/dL — ABNORMAL LOW (ref 11.6–15.9)
LYMPH%: 41.9 % (ref 14.0–49.7)
MCH: 33.1 pg (ref 25.1–34.0)
MCV: 99.7 fL (ref 79.5–101.0)
MONO%: 16 % — ABNORMAL HIGH (ref 0.0–14.0)
NEUT#: 1.9 10*3/uL (ref 1.5–6.5)
Platelets: 163 10*3/uL (ref 145–400)
RDW: 17.3 % — ABNORMAL HIGH (ref 11.2–14.5)

## 2013-08-22 MED ORDER — INFLUENZA VAC SPLIT QUAD 0.5 ML IM SUSP
0.5000 mL | Freq: Once | INTRAMUSCULAR | Status: DC
  Filled 2013-08-22: qty 0.5

## 2013-08-22 NOTE — Patient Instructions (Signed)
You should get flu shot at least by end of October  Portacath needs to be flushed every 6-8 weeks when not used otherwise. They should use the port for your scans in late Oct

## 2013-08-22 NOTE — Progress Notes (Signed)
OFFICE PROGRESS NOTE   08/22/2013   Physicians:D. ClarkePearson; J.Russo, T.Fontaine, [name], [name]. Last gastroenterologist in [place].   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 She is for restaging CTs on 09-25-13 and will see [name] on 09-28-13. [name] has had no acute problems since most recent chemotherapy, tho she has temperature of 99.8 on 2 occasions last week and has had slight bladder discomfort today. With history of UTIs, we will check urine again. She has been fatigued but has been able to take po's, no syncopal episodes, no bleeding. She has PAC in, which should be flushed with the scans on 09-25-13.    ONCOLOGIC HISTORY She had ~ 2 weeks vaginal spotting in late Dec 2013, then presented to [name] in late Jan 2014 after bright red spotting that AM, with uterus larger than had been apparent on exam in Oct 2013. Sonohystogram 01-05-13 showed uterus normal size and echotexture, endometrium 4.3 mm, left ovary normal and right adnexa with 1.1 x 8.4.x8.2 cm cystic and solid mass. Endometrial biopsy benign and CA 125 also on 01-05-13 was 178.8. She had CT AP 01-17-13 with 1.0 x 6.9 x 8.9 cm complex right ovarian mass, no ascites, small retroperitoneal nodes. She was seen by [name] on 01-18-13 and taken to surgery by [name] on 02-13-13, which was TAH/BSO/ omentectomy/ureterolysis/ resection of cul-de-sac tumor/right pelvic lymphadnectomy and resection of rectum with reanastomosis. At completion of surgery there was no gross residual disease. 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 Washings [phone]) had rare clusters of atypical cells. Chemotherapy with dose dense taxol carboplatin was begun day 1 cycle 1 on 03-20-13; ANC was 1.1 on day 15 cycle 1 with taxol given and neupogen added 04-04-13. She had day 1 cycle 2 treatment on 04-17-13, then was briefly hospitalized 5-22 to 04-20-13 after syncopal episode, with antihypertensive agents DCd and UTI treated. She was feeling much better at time of "day 8" cycle 2 on 05-01-13 and did have neupogen 300 mcg x 1 dose on 05-02-13. She was readmitted to hospital 6-5 thru 05-05-13 with fever, empirically on antibiotics and blood cultures negative. She then went on planned beach vacation such that day 1 cycle 3 was administered on 05-22-13, with neupogen on 6-25 and 6-26 with IVF also on day 2. Day 8 cycle 4 was delayed a week until 06-26-13 due to low ANC.   Review of systems as above, also: Bowels continue to move adequately. She has minimal numbness in toes bilaterally which is not interfering with ambulation, none in fingers. She had some nausea last week, now resolved.  Remainder of 10 point Review of Systems negative.  Objective:  Vital signs in last 24 hours:  BP 148/75  Pulse 65  Temp(Src) 97.8 F (36.6 C) (Oral)  Resp 18  Ht 5\' 2"  (1.575 m)  Wt 139 lb 3.2 oz (63.141 kg)  BMI 25.45 kg/m2 Weight is stable. Alert, oriented and appropriate. Ambulatory without difficulty and able to get on and off exam table without assistance. Respirations not labored RA.  Alopecia  HEENT:PERRL, sclerae not icteric. Oral mucosa moist without lesions, posterior pharynx  clear.  Neck supple. No JVD.  Lymphatics:no cervical,suraclavicular, axillary or inguinal adenopathy Resp: clear to auscultation bilaterally and normal percussion bilaterally Cardio: regular rate and rhythm. No gallop. GI: soft, nontender, not distended, no mass or organomegaly. Normally active bowel sounds.  Surgical incision not remarkable. Musculoskeletal/ Extremities: without pitting edema, cords, tenderness Neuro: slight peripheral neuropathy toes bilaterally. Otherwise nonfocal Skin without rash, ecchymosis, petechiae Portacath-without erythema or tenderness  Lab Results:  Results for orders placed in visit on 08/16/13  CBC WITH DIFFERENTIAL      Result Value Range   WBC 4.7  3.9 - 10.3 10e3/uL   NEUT# 1.9  1.5 - 6.5 10e3/uL   HGB 10.4 (*) 11.6 - 15.9 g/dL   HCT 16.1 (*) 09.6 - 04.5 %   Platelets 163  145 - 400 10e3/uL   MCV 99.7  79.5 - 101.0 fL   MCH 33.1  25.1 - 34.0 pg   MCHC 33.2  31.5 - 36.0 g/dL   RBC 4.09 (*) 8.11 - 9.14 10e6/uL   RDW 17.3 (*) 11.2 - 14.5 %   lymph# 2.0  0.9 - 3.3 10e3/uL   MONO# 0.8  0.1 - 0.9 10e3/uL   Eosinophils Absolute 0.0  0.0 - 0.5 10e3/uL   Basophils Absolute 0.0  0.0 - 0.1 10e3/uL   NEUT% 41.1  38.4 - 76.8 %   LYMPH% 41.9  14.0 - 49.7 %   MONO% 16.0 (*) 0.0 - 14.0 %   EOS% 0.4  0.0 - 7.0 %   BASO% 0.6  0.0 - 2.0 %   nRBC 1 (*) 0 - 0 %  UA available after visit not obviously infected, culture pending Studies/Results:  CT AP scheduled for 09-25-13  Medications: I have reviewed the patient's current medications. She had not needed to use the prn hydralazine in several weeks. She will wait another few weeks before taking the flu vaccine, but could have that when she is at [facility] for Dr ClarkePearson's visit 09-28-13 if not done by then.  DISCUSSION: we have talked about possible symptoms if she does develop recurrent disease, and treatment strategies in general for recurrent disease.  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 She will have CT AP 09-25-13 and see Dr  Yolande Jolly 09-28-13. I will see her when she is due PAC flush in Dec, or sooner if needed. 2.HTN, elevated lipids, one episode of paroxysmal Afib. On pradaxa and ASA. HCTZ and losartan held since 04-09-13 and hydralazine now prn. She follows blood pressure regularly at home. 3.hypothyroidism on replacement  4.osteoarthritis, post right hip replacement 2013  5. Previous UTI. Question of minimal symptoms in past few days and we will check urine specimen today. 6. PAC in: patient would like to have this out as soon as possible, but does agree to leave in at least until upcoming scans and visit back to [name]  7. She needs flu shot later this fall 8.mild anemia related to chemotherapy: will follow up with next labs here, expect will gradually improve    [name],Tina P, MD   08/22/2013, 11:18 AM

## 2013-08-22 NOTE — Telephone Encounter (Signed)
appts made and printed...td 

## 2013-08-23 ENCOUNTER — Telehealth: Payer: Self-pay

## 2013-08-23 NOTE — Telephone Encounter (Signed)
Told Ms. Rancourt the results of the urinalysis as noted below by Dr. Darrold Span.

## 2013-08-23 NOTE — Telephone Encounter (Signed)
Message copied by Lorine Bears on Thu Aug 23, 2013  4:40 PM ------      Message from: Jama Flavors P      Created: Thu Aug 23, 2013  3:10 PM       Labs seen and need follow up  Please let her know the UA looked ok. We will check the culture when it completes, but no antibiotics for now.            Cc LA, TH, NW, RW-S ------

## 2013-08-27 ENCOUNTER — Telehealth: Payer: Self-pay | Admitting: *Deleted

## 2013-08-27 NOTE — Telephone Encounter (Signed)
Message copied by Phillis Knack on Mon Aug 27, 2013 10:44 AM ------      Message from: Reece Packer      Created: Fri Aug 24, 2013  7:12 PM       Labs seen and need follow up: please let her know urine culture had no infection            Cc LA, TH ------

## 2013-08-27 NOTE — Telephone Encounter (Signed)
Pt notified of results below 

## 2013-09-10 ENCOUNTER — Telehealth: Payer: Self-pay

## 2013-09-10 ENCOUNTER — Other Ambulatory Visit: Payer: Self-pay

## 2013-09-10 DIAGNOSIS — C561 Malignant neoplasm of right ovary: Secondary | ICD-10-CM

## 2013-09-10 NOTE — Telephone Encounter (Signed)
[name] called to see if a KCL level could be done when she has her PAC accessed for her CT scan on 09-25-13.  Dr. Darrold Span decreased her KCL  to 10 meq  M-W-F.  Last potassium level was 07-24-13 and was 4.0. Added a stat Bmet to lab on 09-25-13 as creatine will need repeating for scan that day. Her Plavix 75 mg was put on hold ~june due to platelets being low.   Can she resume the Plavix?  Last platelet count on 08-22-13 was 163k.

## 2013-09-12 NOTE — Telephone Encounter (Signed)
Spoke with [name] and told her that a B-met was ordered for 09-25-13 to check her potassium and kidney function. Dr. Darrold Span said for her to resume her Plavix 75 mg as her platelet count was good at 163 k on 08-22-13.  Patient verbalized understanding.

## 2013-09-24 ENCOUNTER — Other Ambulatory Visit: Payer: Self-pay | Admitting: Oncology

## 2013-09-25 ENCOUNTER — Other Ambulatory Visit (HOSPITAL_BASED_OUTPATIENT_CLINIC_OR_DEPARTMENT_OTHER): Payer: Medicare Other | Admitting: Lab

## 2013-09-25 ENCOUNTER — Ambulatory Visit (HOSPITAL_BASED_OUTPATIENT_CLINIC_OR_DEPARTMENT_OTHER): Payer: Medicare Other

## 2013-09-25 ENCOUNTER — Telehealth: Payer: Self-pay | Admitting: Cardiology

## 2013-09-25 ENCOUNTER — Ambulatory Visit: Payer: Medicare Other

## 2013-09-25 ENCOUNTER — Encounter (HOSPITAL_COMMUNITY): Payer: Self-pay

## 2013-09-25 ENCOUNTER — Ambulatory Visit (HOSPITAL_COMMUNITY)
Admission: RE | Admit: 2013-09-25 | Discharge: 2013-09-25 | Disposition: A | Discharge status: Home / Self Care | Payer: Medicare Other | Source: Ambulatory Visit | Attending: Gynecologic Oncology | Admitting: Gynecologic Oncology

## 2013-09-25 VITALS — BP 163/82 | HR 59 | Temp 96.5°F

## 2013-09-25 DIAGNOSIS — C569 Malignant neoplasm of unspecified ovary: Secondary | ICD-10-CM

## 2013-09-25 DIAGNOSIS — C561 Malignant neoplasm of right ovary: Secondary | ICD-10-CM

## 2013-09-25 DIAGNOSIS — K449 Diaphragmatic hernia without obstruction or gangrene: Secondary | ICD-10-CM | POA: Insufficient documentation

## 2013-09-25 DIAGNOSIS — Z90710 Acquired absence of both cervix and uterus: Secondary | ICD-10-CM | POA: Insufficient documentation

## 2013-09-25 DIAGNOSIS — Z23 Encounter for immunization: Secondary | ICD-10-CM

## 2013-09-25 DIAGNOSIS — Z96649 Presence of unspecified artificial hip joint: Secondary | ICD-10-CM | POA: Insufficient documentation

## 2013-09-25 LAB — CBC WITH DIFFERENTIAL/PLATELET
EOS%: 0.8 % (ref 0.0–7.0)
Eosinophils Absolute: 0 10*3/uL (ref 0.0–0.5)
LYMPH%: 28.2 % (ref 14.0–49.7)
MCH: 34.3 pg — ABNORMAL HIGH (ref 25.1–34.0)
MCHC: 34.3 g/dL (ref 31.5–36.0)
MCV: 99.8 fL (ref 79.5–101.0)
MONO%: 10.7 % (ref 0.0–14.0)
NEUT#: 3 10*3/uL (ref 1.5–6.5)
Platelets: 128 10*3/uL — ABNORMAL LOW (ref 145–400)
RBC: 3.76 10*6/uL (ref 3.70–5.45)
RDW: 13.5 % (ref 11.2–14.5)

## 2013-09-25 LAB — BASIC METABOLIC PANEL (CC13)
Anion Gap: 9 mEq/L (ref 3–11)
CO2: 26 mEq/L (ref 22–29)
Calcium: 9.3 mg/dL (ref 8.4–10.4)
Glucose: 91 mg/dl (ref 70–140)
Potassium: 3.7 mEq/L (ref 3.5–5.1)
Sodium: 139 mEq/L (ref 136–145)

## 2013-09-25 MED ORDER — HEPARIN SOD (PORK) LOCK FLUSH 100 UNIT/ML IV SOLN
500.0000 [IU] | Freq: Once | INTRAVENOUS | Status: AC
  Administered 2013-09-25: 500 [IU] via INTRAVENOUS
  Filled 2013-09-25: qty 5

## 2013-09-25 MED ORDER — SODIUM CHLORIDE 0.9 % IV SOLN: 1000.0000 mL | Freq: Once | INTRAVENOUS | Status: DC

## 2013-09-25 MED ORDER — INFLUENZA VAC SPLIT QUAD 0.5 ML IM SUSP
0.5000 mL | Freq: Once | INTRAMUSCULAR | Status: AC
  Administered 2013-09-25: 0.5 mL via INTRAMUSCULAR
  Filled 2013-09-25: qty 0.5

## 2013-09-25 MED ORDER — IOHEXOL 300 MG/ML  SOLN
100.0000 mL | Freq: Once | INTRAMUSCULAR | Status: AC | PRN
  Administered 2013-09-25: 100 mL via INTRAVENOUS

## 2013-09-25 MED ORDER — SODIUM CHLORIDE 0.9 % IJ SOLN
10.0000 mL | INTRAMUSCULAR | Status: DC | PRN
  Administered 2013-09-25: 10 mL via INTRAVENOUS
  Filled 2013-09-25: qty 10

## 2013-09-25 NOTE — Telephone Encounter (Signed)
Please ask the patient to send in a list from the insurance company of meds -- Atenolol & Bisporolol are different & there are reasons for both.   I will review, but would prefer not to use Atenolol.  Marykay Lex, MD

## 2013-09-25 NOTE — Telephone Encounter (Signed)
Please call regarding bisoprolol .  Atenolol can replace this according to her insurance company.  Wants to know if she can be converted over to Atenolol for cheaper copay.  Please call

## 2013-09-25 NOTE — Telephone Encounter (Signed)
Returned call and informed pt per instructions by MD.  Pt stated they just told her that this one was going up and the atenolol would be cheaper.  Pt asked to have insurance company fax formulary to Dr. Herbie Baltimore to review so that he can advise on what is best.  Pt verbalized understanding and agreed w/ plan.  Will fax to [facility], Attn: Dr. Donneta Romberg.  Message forwarded to Jasmine December [facility]).

## 2013-09-25 NOTE — Telephone Encounter (Signed)
Patient is returning call from earlier today. 

## 2013-09-25 NOTE — Patient Instructions (Signed)
Influenza Virus Vaccine injection (Fluarix) What is this medicine? INFLUENZA VIRUS VACCINE (in floo EN zuh VAHY ruhs vak SEEN) helps to reduce the risk of getting influenza also known as the flu. This medicine may be used for other purposes; ask your health care provider or pharmacist if you have questions. What should I tell my health care provider before I take this medicine? They need to know if you have any of these conditions: -bleeding disorder like hemophilia -fever or infection -Guillain-Barre syndrome or other neurological problems -immune system problems -infection with the human immunodeficiency virus (HIV) or AIDS -low blood platelet counts -multiple sclerosis -an unusual or allergic reaction to influenza virus vaccine, eggs, chicken proteins, latex, gentamicin, other medicines, foods, dyes or preservatives -pregnant or trying to get pregnant -breast-feeding How should I use this medicine? This vaccine is for injection into a muscle. It is given by a health care professional. A copy of Vaccine Information Statements will be given before each vaccination. Read this sheet carefully each time. The sheet may change frequently. Talk to your pediatrician regarding the use of this medicine in children. Special care may be needed. Overdosage: If you think you have taken too much of this medicine contact a poison control center or emergency room at once. NOTE: This medicine is only for you. Do not share this medicine with others. What if I miss a dose? This does not apply. What may interact with this medicine? -chemotherapy or radiation therapy -medicines that lower your immune system like etanercept, anakinra, infliximab, and adalimumab -medicines that treat or prevent blood clots like warfarin -phenytoin -steroid medicines like prednisone or cortisone -theophylline -vaccines This list may not describe all possible interactions. Give your health care provider a list of all the  medicines, herbs, non-prescription drugs, or dietary supplements you use. Also tell them if you smoke, drink alcohol, or use illegal drugs. Some items may interact with your medicine. What should I watch for while using this medicine? Report any side effects that do not go away within 3 days to your doctor or health care professional. Call your health care provider if any unusual symptoms occur within 6 weeks of receiving this vaccine. You may still catch the flu, but the illness is not usually as bad. You cannot get the flu from the vaccine. The vaccine will not protect against colds or other illnesses that may cause fever. The vaccine is needed every year. What side effects may I notice from receiving this medicine? Side effects that you should report to your doctor or health care professional as soon as possible: -allergic reactions like skin rash, itching or hives, swelling of the face, lips, or tongue Side effects that usually do not require medical attention (report to your doctor or health care professional if they continue or are bothersome): -fever -headache -muscle aches and pains -pain, tenderness, redness, or swelling at site where injected -weak or tired This list may not describe all possible side effects. Call your doctor for medical advice about side effects. You may report side effects to FDA at 1-800-FDA-1088. Where should I keep my medicine? This vaccine is only given in a clinic, pharmacy, doctor's office, or other health care setting and will not be stored at home. NOTE: This sheet is a summary. It may not cover all possible information. If you have questions about this medicine, talk to your doctor, pharmacist, or health care provider.  2012, Elsevier/Gold Standard. (06/12/2008 9:30:40 AM)

## 2013-09-25 NOTE — Telephone Encounter (Signed)
Message forwarded to Dr. Herbie Baltimore.  Paper chart requested to be placed on cart.

## 2013-09-25 NOTE — Telephone Encounter (Signed)
Returned call.  Pt stated the insurance company said they cannot fax the doctor per her request.  Stated MD will have to call the Provider line at [phone] and request information.

## 2013-09-26 NOTE — Progress Notes (Signed)
Told [name] that her KCL on 09-25-13 was 3.7.  This is in normal range.  Dr. Darrold Span said it would be fine to continue the KCL 10 meq M-W-F.  If she has any questions about dosing, she can contact the Dr. Herbie Baltimore her cardiologist who initially  prescribed the medication.  [name] verbalized understanding.

## 2013-09-27 NOTE — Telephone Encounter (Signed)
Message forwarded to S. Martin, RN.  

## 2013-09-27 NOTE — Telephone Encounter (Signed)
Patient has a question about her Potassium dosage since she has completed her chemotherapy----also, have we heard from her insurance company yet?Marland Kitchen  Marland Kitchen

## 2013-09-28 ENCOUNTER — Ambulatory Visit: Payer: Medicare Other

## 2013-09-28 ENCOUNTER — Telehealth: Payer: Self-pay | Admitting: *Deleted

## 2013-09-28 ENCOUNTER — Ambulatory Visit: Payer: Medicare Other | Attending: Gynecology | Admitting: Gynecology

## 2013-09-28 ENCOUNTER — Encounter: Payer: Self-pay | Admitting: Gynecology

## 2013-09-28 ENCOUNTER — Telehealth: Payer: Self-pay | Admitting: Oncology

## 2013-09-28 VITALS — BP 149/65 | HR 63 | Temp 97.5°F | Resp 16 | Ht 62.0 in | Wt 138.2 lb

## 2013-09-28 DIAGNOSIS — I10 Essential (primary) hypertension: Secondary | ICD-10-CM | POA: Insufficient documentation

## 2013-09-28 DIAGNOSIS — Z791 Long term (current) use of non-steroidal anti-inflammatories (NSAID): Secondary | ICD-10-CM | POA: Insufficient documentation

## 2013-09-28 DIAGNOSIS — M1610 Unilateral primary osteoarthritis, unspecified hip: Secondary | ICD-10-CM | POA: Insufficient documentation

## 2013-09-28 DIAGNOSIS — E785 Hyperlipidemia, unspecified: Secondary | ICD-10-CM | POA: Insufficient documentation

## 2013-09-28 DIAGNOSIS — E039 Hypothyroidism, unspecified: Secondary | ICD-10-CM | POA: Insufficient documentation

## 2013-09-28 DIAGNOSIS — I499 Cardiac arrhythmia, unspecified: Secondary | ICD-10-CM | POA: Insufficient documentation

## 2013-09-28 DIAGNOSIS — Z79899 Other long term (current) drug therapy: Secondary | ICD-10-CM | POA: Insufficient documentation

## 2013-09-28 DIAGNOSIS — Z7982 Long term (current) use of aspirin: Secondary | ICD-10-CM | POA: Insufficient documentation

## 2013-09-28 DIAGNOSIS — Z9221 Personal history of antineoplastic chemotherapy: Secondary | ICD-10-CM | POA: Insufficient documentation

## 2013-09-28 DIAGNOSIS — M25569 Pain in unspecified knee: Secondary | ICD-10-CM | POA: Insufficient documentation

## 2013-09-28 DIAGNOSIS — IMO0002 Reserved for concepts with insufficient information to code with codable children: Secondary | ICD-10-CM | POA: Insufficient documentation

## 2013-09-28 DIAGNOSIS — M25559 Pain in unspecified hip: Secondary | ICD-10-CM | POA: Insufficient documentation

## 2013-09-28 DIAGNOSIS — M169 Osteoarthritis of hip, unspecified: Secondary | ICD-10-CM | POA: Insufficient documentation

## 2013-09-28 DIAGNOSIS — C569 Malignant neoplasm of unspecified ovary: Secondary | ICD-10-CM | POA: Insufficient documentation

## 2013-09-28 DIAGNOSIS — M1710 Unilateral primary osteoarthritis, unspecified knee: Secondary | ICD-10-CM | POA: Insufficient documentation

## 2013-09-28 MED ORDER — METOPROLOL TARTRATE 25 MG PO TABS: 25.0000 mg | ORAL_TABLET | Freq: Two times a day (BID) | ORAL | Status: DC

## 2013-09-28 NOTE — Progress Notes (Signed)
Consult Note: Gyn-Onc   [name] 72 y.o. female  Chief Complaint  Patient presents with  . Ovarian Cancer    Follow up visit     Assessment : Stage IIb poorly differentiated ovarian cancer. Clinically free of disease.  Plan:   The patient is given a renew prescription for Estrace 1 mg daily. We will arrange to have her Port-A-Cath removed at her request. She is scheduled to see Dr. Darrold Span in 6 weeks plan on seeing her about 3 months thereafter. We will alternate visits with Dr. Darrold Span for the next 2 years. The patient is given instructions and warning signs regarding recurrent ovarian cancer although her prognosis is very good.  Interval History:   The patient returns today as previously scheduled. She is now completed 6 cycles of carboplatin and Taxol chemotherapy. She had a CT scan this week which is entirely negative and her CA 125 value is 18.3 units per mL. The patient has no abdominal symptoms. She specifically has no GI or GU symptoms except for chronic constipation.. Functional status is good. She does not seem to have any peripheral neuropathy.  HPI:The patient initially presented with a pelvic mass and elevated CA 125 (178 units per mL) She underwent exploratory laparotomy and debulking on 02/13/2013. Final pathology showed a poorly differentiated ovarian cancer involving both ovaries pelvic peritoneum and rectal muscularis. All gross disease was resected.  She then received 6 cycles of carboplatin and Taxol chemotherapy completed in September 2014. At the completion of chemotherapy her CA 125 was 18 units per mL.   Review of Systems:10 point review of systems is negative except as noted in interval history.   Vitals: Blood pressure 149/65, pulse 63, temperature 97.5 F (36.4 C), temperature source Oral, resp. rate 16, height 5\' 2"  (1.575 m), weight 138 lb 3.2 oz (62.687 kg).  Physical Exam: General : The patient is a healthy woman in no acute distress.  HEENT:  normocephalic, extraoccular movements normal; neck is supple without thyromegally  Lynphnodes: Supraclavicular and inguinal nodes not enlarged  Abdomen: Soft, non-tender, no ascites, no organomegally, no masses, no hernias , midline incision is healing well  Pelvic:    EGBUS: Normal female  Vagina bladder urethra: Normal  Vaginal cuff is well healed.  Cervix and uterus are surgically absent  Bimanual exam: No masses nodularity or fullness.  Rectovaginal exam confirms      Lower extremities: No edema or varicosities. Normal range of motion      Allergies  Allergen Reactions  . Codeine Other (See Comments)    Does not like the feeling she gets  . Lisinopril     LIP NUMBNESS    Past Medical History  Diagnosis Date  . Hypertension   . Dyslipidemia   . Hypothyroidism   . Arthritis     OSTEOARTHRITIS   -- CONSTANT PAIN RIGHT HIP---AND PAIN LEFT KNEE--PT STATES SHE GETS INJECTIONS INTO HER KNEE  . Dysrhythmia     HX OF PAROXYSMAL ATRIAL FIB  . Complication of anesthesia     BLOOD PRESSURE DROPPED WITH NASAL SURGERY, ONE OF THE CARPAL TUNNEL REPAIRS AND DURING A COLONOSCOPY  . Cancer     Past Surgical History  Procedure Laterality Date  . Bilateral carpal tunnel repair  2007  . Dilation and curettage of uterus  1969  . Surgery for ruptured ovarian cyst  1969  . Rhinoplasty for fractured nose  1986  . Left knee arthroscopy   2011  . Total hip arthroplasty  02/25/2012    Procedure: TOTAL HIP ARTHROPLASTY ANTERIOR APPROACH;  Surgeon: Kathryne Hitch, MD;  Location: [facility];  Service: Orthopedics;  Laterality: Right;  . Tonsillectomy  1962  . Laparotomy Bilateral 02/13/2013    Procedure: EXPLORATORY LAPAROTOMY TOTAL ABDOMINAL HYSTERECTOMY BILATERAL SALPINGO-OOPHORECTOMY, Partial Rectal Resection with Reanastamosis;  Surgeon: Jeannette Corpus, MD;  Location: [facility];  Service: Gynecology;  Laterality: Bilateral;  . Omentectomy  02/13/2013    Procedure:  OMENTECTOMY;  Surgeon: Jeannette Corpus, MD;  Location: [facility];  Service: Gynecology;;  . Lymphadenectomy Right 02/13/2013    Procedure: PEVLIC  LYMPHADENECTOMY, DEBULKING right pelvic tumor nodules;  Surgeon: Jeannette Corpus, MD;  Location: [facility];  Service: Gynecology;  Laterality: Right;    Current Outpatient Prescriptions  Medication Sig Dispense Refill  . aspirin EC 81 MG tablet Take 81 mg by mouth daily.      . bisacodyl (DULCOLAX) 5 MG EC tablet Take 1 tablet (5 mg total) by mouth daily as needed.  30 tablet  0  . bisoprolol (ZEBETA) 5 MG tablet Take 5 mg by mouth 2 (two) times daily.      . clopidogrel (PLAVIX) 75 MG tablet Take 75 mg by mouth daily. Ok per Dr. Darrold Span to restart medication.      . [facility] SENNA PLUS 8.6-50 MG per tablet TAKE 1-2 TABLETS TWICE DAILY AS NEEDED  60 tablet  0  . docusate sodium (COLACE) 100 MG capsule Take 100 mg by mouth 2 (two) times daily.      Marland Kitchen estradiol (ESTRACE) 1 MG tablet Take 1 tablet (1 mg total) by mouth every morning. PT STATES SHE WANTS ESTRACE--NOT THE GENERIC  90 tablet  3  . Glucosamine HCl (GLUCOSAMINE PO) Take 1,000 mg by mouth daily.      . hydrALAZINE (APRESOLINE) 25 MG tablet 2 times daily only if SBP > 155      . levothyroxine (SYNTHROID, LEVOTHROID) 88 MCG tablet Take 88 mcg by mouth every morning. PT STATES SHE NEEDS THE SYNTHROID--DOES NOT WANT THE GENERIC      . lidocaine-prilocaine (EMLA) cream Apply topically as needed. Apply 1-2 hours prior to [facility] access as needed.  30 g  2  . LORazepam (ATIVAN) 1 MG tablet Take 1 mg by mouth every 8 (eight) hours as needed for anxiety.      . pantoprazole (PROTONIX) 40 MG tablet Take 40 mg by mouth daily.      . polyethylene glycol (MIRALAX / GLYCOLAX) packet Take 17 g by mouth daily.      Marland Kitchen acetaminophen (TYLENOL) 325 MG tablet Take 2 tablets (650 mg total) by mouth every 6 (six) hours as needed.      . Multiple Vitamin (MULITIVITAMIN WITH MINERALS) TABS Take 1 tablet by mouth  every morning.      Marland Kitchen omeprazole (PRILOSEC) 20 MG capsule Take 20 mg by mouth daily.      . ondansetron (ZOFRAN) 8 MG tablet Take 1-2 tablets (8-16 mg total) by mouth every 12 (twelve) hours as needed for nausea.  20 tablet  1  . potassium chloride (K-DUR) 10 MEQ tablet Take 10 mEq by mouth every Monday, Wednesday, and Friday.      . propafenone (RYTHMOL) 225 MG tablet Take 225 mg by mouth 2 (two) times daily. 9am9pm       No current facility-administered medications for this visit.    History   Social History  . Marital Status: Married    Spouse Name: N/A  Number of Children: N/A  . Years of Education: N/A   Occupational History  . Not on file.   Social History Main Topics  . Smoking status: Never Smoker   . Smokeless tobacco: Never Used  . Alcohol Use: No  . Drug Use: No  . Sexual Activity: Not Currently   Other Topics Concern  . Not on file   Social History Narrative  . No narrative on file    Family History  Problem Relation Age of Onset  . Hypertension Mother       Jeannette Corpus, MD 09/28/2013, 10:00 AM

## 2013-09-28 NOTE — Telephone Encounter (Signed)
called and spoke to a Rep.of patient insurance company  Medications on tier 1  are metoprolol tart.,atenolol ;  Tier 2 bisoprolol;  tier 3 bystolic ,metoprolol succ    Will defer to Dr Herbie Baltimore to see if he would want to switch to something other than Bisoprolol

## 2013-09-28 NOTE — Telephone Encounter (Signed)
[name] called from radiology at [facility] in regards to stopping [name]'s Plavix. She is having the procedure November 19th (port removal) needs to be off of it 5 days prior to.

## 2013-09-28 NOTE — Telephone Encounter (Signed)
, °

## 2013-09-28 NOTE — Telephone Encounter (Signed)
Returned call to [name] at [facility] radiology with instructions per Dr. Herbie Baltimore to hold Plavix 5-7 days prior to procedure. [name] verbalized understanding.

## 2013-09-28 NOTE — Telephone Encounter (Signed)
She can stop Plavix ~5-7 days pre-procedure.  Marykay Lex, MD

## 2013-09-28 NOTE — Telephone Encounter (Signed)
Convert to Metoprol 25 mg bid

## 2013-09-28 NOTE — Telephone Encounter (Signed)
Spoke to patient.She states that her Oncologist has reduced potassium to 3 x a day because potassium is 3.7 after copmleting chemotherapy 6 weeks ago. Patient wanted to know if she should do anything different.  Informed patient follow Oncologist instruction.

## 2013-09-28 NOTE — Addendum Note (Signed)
Addended by: Warner Mccreedy D on: 09/28/2013 10:18 AM   Modules accepted: Orders

## 2013-09-28 NOTE — Telephone Encounter (Signed)
Notified patient of the change medication  Bisoprolol to metoprolol tart 25 mg bid per Dr Herbie Baltimore  E-sent new prescription. Patient aware and will start after she completes the Bisoprolol.

## 2013-09-28 NOTE — Patient Instructions (Signed)
Radiology will contact you about port removal.  Plan to follow up in five months or sooner if needed.  Please call for any questions or concerns.

## 2013-09-28 NOTE — Telephone Encounter (Signed)
Called left message to call back 

## 2013-09-28 NOTE — Telephone Encounter (Signed)
Sent to Dr. Donneta Romberg, RN to advise

## 2013-10-08 ENCOUNTER — Telehealth: Payer: Self-pay | Admitting: Cardiology

## 2013-10-08 MED ORDER — PANTOPRAZOLE SODIUM 40 MG PO TBEC: 40.0000 mg | DELAYED_RELEASE_TABLET | Freq: Two times a day (BID) | ORAL | Status: DC

## 2013-10-08 NOTE — Telephone Encounter (Signed)
If Protonix not as helpful, can use Dexilant.  Marykay Lex, MD

## 2013-10-08 NOTE — Telephone Encounter (Signed)
Returning your call-Please call after 1:30.

## 2013-10-08 NOTE — Telephone Encounter (Signed)
Returned call.  Left message to call back before 4pm.  

## 2013-10-08 NOTE — Telephone Encounter (Signed)
Returning ambers call °

## 2013-10-08 NOTE — Telephone Encounter (Signed)
Please call-she need o know what she can take for GERD.Have taken Protonix,not helping much and was on Prilosec while she was on Chemo.

## 2013-10-08 NOTE — Telephone Encounter (Signed)
Returned call and pt verified x 2.  Pt stated she was on Protonix before chemo and switched to Prilosec when she was on chemo.  Stated chemo done and back on clopidogrel and Protonix.  Stated Protonix not working that well.  Pt informed Protonix is safe to take w/ clopidogrel and pharmacist will be notified for further instructions.  Pt verbalized understanding and agreed w/ plan.  Belenda Cruise, PharmD, notified and advised pt take Protonix 40 mg BID x 1 week and if improvement, call back so Rx can be sent in.    Returned call and informed pt per instructions by PharmD.  Pt verbalized understanding and agreed w/ plan.

## 2013-10-11 ENCOUNTER — Telehealth: Payer: Self-pay | Admitting: *Deleted

## 2013-10-11 NOTE — Telephone Encounter (Signed)
PT. HAS ARTHRITIS IN HER KNEE. MAY SHE HAVE HER ROUTINE "STEROID INJECTION?" PT. IS STILL VERY FATIGUED. IS THIS NORMAL AFTER CHEMO? PT. REMOVED HER TOENAIL POLISH THIS MORNING. TWO OF HER TOENAILS ARE COMPLETELY A LIGHT BLACK COLOR. ONE TOENAIL IS BLEEDING A LITTLE. PT. DOES NOT HAVE A FEVER. THERE IS NO GREEN OR YELLOW DRAINAGE FROM THE TWO TOENAILS. PT. MAY HAVE HER STEROID INJECTION IN HER KNEE. INFORMED PT. IT MAY TAKE SEVERAL MONTHS BEFORE HER ENERGY LEVEL IS BACK TO NORMAL. SHE WILL NEED TO TAKE REST PERIODS BETWEEN ACTIVITIES. PT. SHOULD NOT POLISH HER TOENAILS SO SHE MAY KEEP A CHECK ON HER TOENAILS. ANY FEVER OR SIGNS OF INFECTION PT. NEEDS TO CONTACT HER PRIMARY CARE PHYSICIAN. PT. VOICES UNDERSTANDING.

## 2013-10-15 ENCOUNTER — Other Ambulatory Visit: Payer: Self-pay | Admitting: Gynecology

## 2013-10-15 ENCOUNTER — Other Ambulatory Visit: Payer: Self-pay | Admitting: Radiology

## 2013-10-17 ENCOUNTER — Encounter (HOSPITAL_COMMUNITY): Payer: Self-pay

## 2013-10-17 ENCOUNTER — Ambulatory Visit (HOSPITAL_COMMUNITY)
Admission: RE | Admit: 2013-10-17 | Discharge: 2013-10-17 | Disposition: A | Discharge status: Home / Self Care | Payer: Medicare Other | Source: Ambulatory Visit | Attending: Oncology | Admitting: Oncology

## 2013-10-17 ENCOUNTER — Other Ambulatory Visit (HOSPITAL_COMMUNITY): Payer: Medicare Other

## 2013-10-17 ENCOUNTER — Ambulatory Visit (HOSPITAL_COMMUNITY)
Admission: RE | Admit: 2013-10-17 | Discharge: 2013-10-17 | Disposition: A | Discharge status: Home / Self Care | Payer: Medicare Other | Source: Ambulatory Visit | Attending: Gynecologic Oncology | Admitting: Gynecologic Oncology

## 2013-10-17 DIAGNOSIS — E785 Hyperlipidemia, unspecified: Secondary | ICD-10-CM | POA: Insufficient documentation

## 2013-10-17 DIAGNOSIS — Z452 Encounter for adjustment and management of vascular access device: Secondary | ICD-10-CM | POA: Insufficient documentation

## 2013-10-17 DIAGNOSIS — C569 Malignant neoplasm of unspecified ovary: Secondary | ICD-10-CM

## 2013-10-17 DIAGNOSIS — E039 Hypothyroidism, unspecified: Secondary | ICD-10-CM | POA: Insufficient documentation

## 2013-10-17 DIAGNOSIS — Z79899 Other long term (current) drug therapy: Secondary | ICD-10-CM | POA: Insufficient documentation

## 2013-10-17 DIAGNOSIS — I499 Cardiac arrhythmia, unspecified: Secondary | ICD-10-CM | POA: Insufficient documentation

## 2013-10-17 DIAGNOSIS — I10 Essential (primary) hypertension: Secondary | ICD-10-CM | POA: Insufficient documentation

## 2013-10-17 DIAGNOSIS — Z9221 Personal history of antineoplastic chemotherapy: Secondary | ICD-10-CM | POA: Insufficient documentation

## 2013-10-17 LAB — CBC
HCT: 37.8 % (ref 36.0–46.0)
MCH: 33.9 pg (ref 26.0–34.0)
MCHC: 34.9 g/dL (ref 30.0–36.0)
MCV: 97.2 fL (ref 78.0–100.0)
Platelets: 132 10*3/uL — ABNORMAL LOW (ref 150–400)
RDW: 12.3 % (ref 11.5–15.5)
WBC: 4.8 10*3/uL (ref 4.0–10.5)

## 2013-10-17 LAB — PROTIME-INR: Prothrombin Time: 12.7 seconds (ref 11.6–15.2)

## 2013-10-17 MED ORDER — FENTANYL CITRATE 0.05 MG/ML IJ SOLN
INTRAMUSCULAR | Status: AC | PRN
  Administered 2013-10-17: 100 ug via INTRAVENOUS

## 2013-10-17 MED ORDER — SODIUM CHLORIDE 0.9 % IV SOLN
INTRAVENOUS | Status: DC
  Administered 2013-10-17: 20 mL/h via INTRAVENOUS

## 2013-10-17 MED ORDER — MIDAZOLAM HCL 2 MG/2ML IJ SOLN
INTRAMUSCULAR | Status: AC
  Filled 2013-10-17: qty 2

## 2013-10-17 MED ORDER — MIDAZOLAM HCL 2 MG/2ML IJ SOLN
INTRAMUSCULAR | Status: AC | PRN
  Administered 2013-10-17: 2 mg via INTRAVENOUS

## 2013-10-17 MED ORDER — CEFAZOLIN SODIUM-DEXTROSE 2-3 GM-% IV SOLR
2.0000 g | INTRAVENOUS | Status: AC
  Administered 2013-10-17: 2 g via INTRAVENOUS
  Filled 2013-10-17: qty 50

## 2013-10-17 MED ORDER — FENTANYL CITRATE 0.05 MG/ML IJ SOLN
INTRAMUSCULAR | Status: AC
  Filled 2013-10-17: qty 2

## 2013-10-17 MED ORDER — LIDOCAINE HCL 1 % IJ SOLN
INTRAMUSCULAR | Status: AC
  Filled 2013-10-17: qty 20

## 2013-10-17 NOTE — H&P (Signed)
Chief Complaint: "I'm here for my port removal" HPI: [name] is an 72 y.o. female with ovarian cancer who has now completed her chemotherapy. She is now scheduled for her port to be removed. She held her Plavix for the past 5 days and otherwise feels well. PMHx and meds reviewed.  Past Medical History:  Past Medical History  Diagnosis Date  . Hypertension   . Dyslipidemia   . Hypothyroidism   . Arthritis     OSTEOARTHRITIS   -- CONSTANT PAIN RIGHT HIP---AND PAIN LEFT KNEE--PT STATES SHE GETS INJECTIONS INTO HER KNEE  . Dysrhythmia     HX OF PAROXYSMAL ATRIAL FIB  . Complication of anesthesia     BLOOD PRESSURE DROPPED WITH NASAL SURGERY, ONE OF THE CARPAL TUNNEL REPAIRS AND DURING A COLONOSCOPY  . Cancer     Past Surgical History:  Past Surgical History  Procedure Laterality Date  . Bilateral carpal tunnel repair  2007  . Dilation and curettage of uterus  1969  . Surgery for ruptured ovarian cyst  1969  . Rhinoplasty for fractured nose  1986  . Left knee arthroscopy   2011  . Total hip arthroplasty  02/25/2012    Procedure: TOTAL HIP ARTHROPLASTY ANTERIOR APPROACH;  Surgeon: Kathryne Hitch, MD;  Location: [facility];  Service: Orthopedics;  Laterality: Right;  . Tonsillectomy  1962  . Laparotomy Bilateral 02/13/2013    Procedure: EXPLORATORY LAPAROTOMY TOTAL ABDOMINAL HYSTERECTOMY BILATERAL SALPINGO-OOPHORECTOMY, Partial Rectal Resection with Reanastamosis;  Surgeon: Jeannette Corpus, MD;  Location: [facility];  Service: Gynecology;  Laterality: Bilateral;  . Omentectomy  02/13/2013    Procedure: OMENTECTOMY;  Surgeon: Jeannette Corpus, MD;  Location: [facility];  Service: Gynecology;;  . Lymphadenectomy Right 02/13/2013    Procedure: PEVLIC  LYMPHADENECTOMY, DEBULKING right pelvic tumor nodules;  Surgeon: Jeannette Corpus, MD;  Location: [facility];  Service: Gynecology;  Laterality: Right;    Family History:  Family History  Problem Relation Age of Onset   . Hypertension Mother     Social History:  reports that she has never smoked. She has never used smokeless tobacco. She reports that she does not drink alcohol or use illicit drugs.  Allergies:  Allergies  Allergen Reactions  . Codeine Other (See Comments)    Does not like the feeling she gets  . Lisinopril     LIP NUMBNESS    Medications:   Medication List    ASK your doctor about these medications       aspirin EC 81 MG tablet  Take 81 mg by mouth daily.     bisacodyl 5 MG EC tablet  Commonly known as:  DULCOLAX  Take 1 tablet (5 mg total) by mouth daily as needed.     bisoprolol 5 MG tablet  Commonly known as:  ZEBETA  Take 5 mg by mouth 2 (two) times daily.     clopidogrel 75 MG tablet  Commonly known as:  PLAVIX  Take 75 mg by mouth daily with breakfast.     docusate sodium 100 MG capsule  Commonly known as:  COLACE  Take 100 mg by mouth 2 (two) times daily.     estradiol 1 MG tablet  Commonly known as:  ESTRACE  Take 1 tablet (1 mg total) by mouth every morning. PT STATES SHE WANTS ESTRACE--NOT THE GENERIC     GLUCOSAMINE PO  Take 1,000 mg by mouth daily.     levothyroxine 88 MCG tablet  Commonly known  as:  SYNTHROID, LEVOTHROID  Take 88 mcg by mouth every morning. PT STATES SHE NEEDS THE SYNTHROID--DOES NOT WANT THE GENERIC     multivitamin with minerals Tabs tablet  Take 1 tablet by mouth daily.     pantoprazole 40 MG tablet  Commonly known as:  PROTONIX  Take 40 mg by mouth every morning.     polyethylene glycol packet  Commonly known as:  MIRALAX / GLYCOLAX  Take 17 g by mouth daily.     potassium chloride 10 MEQ tablet  Commonly known as:  K-DUR  Take 10 mEq by mouth every Monday, Wednesday, and Friday.     propafenone 225 MG tablet  Commonly known as:  RYTHMOL  Take 225 mg by mouth 2 (two) times daily. 9am9pm     senna 8.6 MG tablet  Commonly known as:  SENOKOT  Take 1 tablet by mouth daily.     SYSTANE OP  Apply 1 drop to eye 2  (two) times daily.        Please HPI for pertinent positives, otherwise complete 10 system ROS negative.  Physical Exam: BP 147/78  Pulse 61  Temp(Src) 97.4 F (36.3 C) (Oral)  Resp 16  Ht 5\' 3"  (1.6 m)  Wt 138 lb (62.596 kg)  BMI 24.45 kg/m2  SpO2 100% Body mass index is 24.45 kg/(m^2).   General Appearance:  Alert, cooperative, no distress, appears stated age  Head:  Normocephalic, without obvious abnormality, atraumatic  ENT: Unremarkable  Neck: Supple, symmetrical, trachea midline  Lungs:   Clear to auscultation bilaterally, no w/r/r  Chest Wall:  No tenderness or deformity. Port palpable right chest.  Heart:  Regular rate and rhythm, S1, S2 normal, no murmur, rub or gallop.  Neurologic: Normal affect, no gross deficits.   Results for orders placed during the hospital encounter of 10/17/13 (from the past 48 hour(s))  APTT     Status: None   Collection Time    10/17/13  6:48 AM      Result Value Range   aPTT 34  24 - 37 seconds  CBC     Status: Abnormal   Collection Time    10/17/13  6:48 AM      Result Value Range   WBC 4.8  4.0 - 10.5 K/uL   RBC 3.89  3.87 - 5.11 MIL/uL   Hemoglobin 13.2  12.0 - 15.0 g/dL   HCT 16.1  09.6 - 04.5 %   MCV 97.2  78.0 - 100.0 fL   MCH 33.9  26.0 - 34.0 pg   MCHC 34.9  30.0 - 36.0 g/dL   RDW 40.9  81.1 - 91.4 %   Platelets 132 (*) 150 - 400 K/uL  PROTIME-INR     Status: None   Collection Time    10/17/13  6:48 AM      Result Value Range   Prothrombin Time 12.7  11.6 - 15.2 seconds   INR 0.97  0.00 - 1.49   No results found.  Assessment/Plan Ovarian cancer For port removal Explained procedure, risks, complications, use of sedation. Labs reviewed, ok Consent signed in chart  Brayton El PA-C 10/17/2013, 8:21 AM

## 2013-10-17 NOTE — H&P (Signed)
Agree 

## 2013-10-17 NOTE — Procedures (Signed)
Procedure:  Removal of right chest port Findings:  Port successfully removed.  Wound closed.  No complications.

## 2013-10-18 ENCOUNTER — Telehealth: Payer: Self-pay | Admitting: Oncology

## 2013-10-18 NOTE — Telephone Encounter (Signed)
pt called [name] to cancel Flush appt for 12/10 as no longer has port..done and called pt back to advise of same shh

## 2013-11-07 ENCOUNTER — Other Ambulatory Visit (HOSPITAL_BASED_OUTPATIENT_CLINIC_OR_DEPARTMENT_OTHER): Payer: Medicare Other

## 2013-11-07 DIAGNOSIS — C569 Malignant neoplasm of unspecified ovary: Secondary | ICD-10-CM

## 2013-11-07 DIAGNOSIS — C561 Malignant neoplasm of right ovary: Secondary | ICD-10-CM

## 2013-11-07 LAB — COMPREHENSIVE METABOLIC PANEL (CC13)
ALT: 17 U/L (ref 0–55)
AST: 17 U/L (ref 5–34)
Albumin: 3.8 g/dL (ref 3.5–5.0)
Alkaline Phosphatase: 88 U/L (ref 40–150)
BUN: 7.9 mg/dL (ref 7.0–26.0)
Glucose: 99 mg/dl (ref 70–140)
Potassium: 3.8 mEq/L (ref 3.5–5.1)
Sodium: 139 mEq/L (ref 136–145)
Total Bilirubin: 0.64 mg/dL (ref 0.20–1.20)

## 2013-11-07 LAB — CBC WITH DIFFERENTIAL/PLATELET
BASO%: 0.5 % (ref 0.0–2.0)
Basophils Absolute: 0 10*3/uL (ref 0.0–0.1)
EOS%: 0.6 % (ref 0.0–7.0)
Eosinophils Absolute: 0 10*3/uL (ref 0.0–0.5)
HGB: 13.5 g/dL (ref 11.6–15.9)
LYMPH%: 24.3 % (ref 14.0–49.7)
MCH: 34 pg (ref 25.1–34.0)
MCV: 98.4 fL (ref 79.5–101.0)
MONO%: 8.1 % (ref 0.0–14.0)
Platelets: 151 10*3/uL (ref 145–400)
RBC: 3.96 10*6/uL (ref 3.70–5.45)
RDW: 12.8 % (ref 11.2–14.5)

## 2013-11-08 LAB — CA 125: CA 125: 9.5 U/mL (ref 0.0–30.2)

## 2013-11-13 ENCOUNTER — Other Ambulatory Visit: Payer: Self-pay | Admitting: Oncology

## 2013-11-14 ENCOUNTER — Encounter: Payer: Self-pay | Admitting: Oncology

## 2013-11-14 ENCOUNTER — Other Ambulatory Visit: Payer: Medicare Other | Admitting: Lab

## 2013-11-14 ENCOUNTER — Ambulatory Visit (HOSPITAL_BASED_OUTPATIENT_CLINIC_OR_DEPARTMENT_OTHER): Payer: Medicare Other | Admitting: Oncology

## 2013-11-14 ENCOUNTER — Telehealth: Payer: Self-pay | Admitting: Oncology

## 2013-11-14 VITALS — BP 156/75 | HR 62 | Temp 98.3°F | Resp 17 | Ht 63.0 in | Wt 135.4 lb

## 2013-11-14 DIAGNOSIS — C569 Malignant neoplasm of unspecified ovary: Secondary | ICD-10-CM

## 2013-11-14 NOTE — Progress Notes (Signed)
OFFICE PROGRESS NOTE   11/14/2013   Physicians:D. ClarkePearson; J.Russo, T.Fontaine, [name], [name]. Last gastroenterologist in [place].   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 Follow up CT AP 09-25-13 had no evidence of recurrent or metastatic disease; she saw [name] on 09-28-13 and is to see him again in March. PAC was removed in Nov at her request, as tubing from IJ approach was uncomfortable to her. She is still more fatigued with activity than baseline, tho this is gradually improving. She is not yet able to vacuum, but is able to be more active in AMs and to prepare dinner and clean kitchen. She had URI symptoms last week which are nearly resolved. She still needs miralax + bid colace + senokot to keep bowels moving, likely related to partial rectal resection and reanastomosis with the ovarian surgery (note CT in Oct also showed moderate stool in rectum). She has more thickening and discoloration of toenails than prior to chemo.  She saw Dr Timothy Lasso on 11-12-13.  ONCOLOGIC HISTORY She had ~ 2 weeks vaginal spotting in late Dec 2013, then presented to [name] in late Jan 2014 after bright red spotting that AM, with uterus larger than had been apparent on exam in Oct 2013. Sonohystogram 01-05-13 showed uterus normal size and echotexture, endometrium 4.3 mm, left ovary normal and right adnexa with 1.1 x 8.4.x8.2 cm cystic and solid mass. Endometrial biopsy benign and CA 125 also on 01-05-13 was 178.8. She had CT AP 01-17-13 with 1.0 x 6.9 x 8.9 cm complex right ovarian mass, no ascites, small retroperitoneal nodes. She was seen by [name] on 01-18-13 and taken to surgery by [name] on 02-13-13, which was TAH/BSO/ omentectomy/ureterolysis/ resection of cul-de-sac tumor/right pelvic lymphadnectomy and  resection of rectum with reanastomosis. At completion of surgery there was no gross residual disease. 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 [place] [phone]) had rare clusters of atypical cells. Chemotherapy with dose dense taxol carboplatin was begun day 1 cycle 1 on 03-20-13; ANC was 1.1 on day 15 cycle 1 with taxol given and neupogen added 04-04-13. She had day 1 cycle 2 treatment on 04-17-13, then was briefly hospitalized 5-22 to 04-20-13 after syncopal episode, with antihypertensive agents DCd and UTI treated. She was feeling much better at time of "day 8" cycle 2 on 05-01-13 and did have neupogen 300 mcg x 1 dose on 05-02-13. She was readmitted to hospital 6-5 thru 05-05-13 with fever, empirically on antibiotics and blood cultures negative. She then went on planned beach vacation such that day 1 cycle 3 was administered on 05-22-13, with neupogen. Day 8 cycle 4 was delayed due to low ANC. Cycle 6 completed 08-14-13. CT AP 09-25-13 had no evidence of cancer and CA 125 was 18.3 on 07-24-2013.  Review of systems as above, also: No pain. Still some post nasal drainage. Hair is growing back. Some bleeding from toenail in Nov. No swelling LE or feet. No SOB at rest. Bladder ok. Appetite better, no significant N/V.  Remainder of 10 point Review of Systems negative.  Objective:  Vital signs in last 24 hours:  BP 156/75  Pulse 62  Temp(Src) 98.3 F (36.8 C) (Oral)  Resp 17  Ht 5\' 3"  (1.6 m)  Wt 135 lb 6.4 oz (61.417 kg)  BMI 23.99 kg/m2  SpO2 100% weight is down 4 lbs.   Alert, oriented and appropriate, looks comfortable and seems more energetic. Ambulatory without  difficulty.  Alopecia resolving, still wearing wig.  HEENT:PERRL, sclerae not  icteric. Oral mucosa moist without lesions, posterior pharynx clear.  Neck supple. No JVD.  Lymphatics:no cervical,suraclavicular, axillary or inguinal adenopathy Resp: clear to auscultation bilaterally and normal percussion bilaterally Cardio: regular rate and rhythm. No gallop. GI: soft, nontender, not distended, no mass or organomegaly. Some bowel sounds. Surgical incision not remarkable. Musculoskeletal/ Extremities: without pitting edema, cords, tenderness. Yellowish discoloration and thickening of all toenails suggests fungal infection,  no "blackness" as had been described, no suprainfection, no swelling or tenderness feet. Neuro: no peripheral neuropathy. Otherwise nonfocal Skin without rash, ecchymosis, petechiae. Toenails as above. Portacath scar ok.  Lab Results: From 11-07-13 Results for orders placed in visit on 11/07/13  CBC WITH DIFFERENTIAL      Result Value Range   WBC 6.1  3.9 - 10.3 10e3/uL   NEUT# 4.1  1.5 - 6.5 10e3/uL   HGB 13.5  11.6 - 15.9 g/dL   HCT 16.1  09.6 - 04.5 %   Platelets 151  145 - 400 10e3/uL   MCV 98.4  79.5 - 101.0 fL   MCH 34.0  25.1 - 34.0 pg   MCHC 34.6  31.5 - 36.0 g/dL   RBC 4.09  8.11 - 9.14 10e6/uL   RDW 12.8  11.2 - 14.5 %   lymph# 1.5  0.9 - 3.3 10e3/uL   MONO# 0.5  0.1 - 0.9 10e3/uL   Eosinophils Absolute 0.0  0.0 - 0.5 10e3/uL   Basophils Absolute 0.0  0.0 - 0.1 10e3/uL   NEUT% 66.5  38.4 - 76.8 %   LYMPH% 24.3  14.0 - 49.7 %   MONO% 8.1  0.0 - 14.0 %   EOS% 0.6  0.0 - 7.0 %   BASO% 0.5  0.0 - 2.0 %  COMPREHENSIVE METABOLIC PANEL (CC13)      Result Value Range   Sodium 139  136 - 145 mEq/L   Potassium 3.8  3.5 - 5.1 mEq/L   Chloride 102  98 - 109 mEq/L   CO2 28  22 - 29 mEq/L   Glucose 99  70 - 140 mg/dl   BUN 7.9  7.0 - 78.2 mg/dL   Creatinine 0.7  0.6 - 1.1 mg/dL   Total Bilirubin 9.56  0.20 - 1.20 mg/dL   Alkaline Phosphatase 88  40 - 150 U/L   AST 17  5 - 34 U/L   ALT 17  0 - 55 U/L   Total Protein 7.0  6.4 - 8.3  g/dL   Albumin 3.8  3.5 - 5.0 g/dL   Calcium 9.6  8.4 - 21.3 mg/dL   Anion Gap 10  3 - 11 mEq/L  CA 125      Result Value Range   CA 125 9.5  0.0 - 30.2 U/mL     Studies/Results:  CT ABDOMEN AND PELVIS WITH CONTRAST 09-25-13 TECHNIQUE:  Multidetector CT imaging of the abdomen and pelvis was performed  using the standard protocol following bolus administration of  intravenous contrast.  CONTRAST: OMNIPAQUE IOHEXOL 300 MG/ML SOLN  COMPARISON: 01/17/2013  FINDINGS:  Lower Chest: Subsegmental atelectasis or scarring at the left lung  base. Mild cardiomegaly,  without pericardial or pleural effusion. A  small hiatal hernia.  Abdomen/Pelvis: Mild hepatic steatosis suspected. No focal liver  lesion. Normal spleen, distal stomach, pancreas, gallbladder,  biliary tract, adrenal glands. Too small to characterize interpolar  right renal lesion, likely a cyst.  Normal left kidney. No retroperitoneal or retrocrural adenopathy.  Moderate amount of stool within the rectum. Normal terminal ileum.  Normal small bowel without abdominal ascites. No evidence of omental  or peritoneal disease.  Mild beam hardening artifact from right hip arthroplasty. Given this  factor, no pelvic adenopathy. Normal urinary bladder. Interval  hysterectomy and resection of right pelvic mass. No evidence of  locally recurrent disease. No significant free fluid.  Bones/Musculoskeletal: Right hip arthroplasty. Mild convex right  lumbar spine curvature.  IMPRESSION:  1. Interval resection of previously described right pelvic mass. No  evidence of recurrent or metastatic disease.  2. Small hiatal hernia.  3. Moderate amount of stool within the rectum. Question constipation  or even mild fecal impaction  Medications: I have reviewed the patient's current medications.    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 Now on observation since good CT late Oct 2014. She will have CA 125 with Dr Wanita Chamberlain visit in March and I will see her ~ June with CBC CMET CA 125, or sooner if needed. Fatigue hopefully will continue to gradually improve out further from treatment. 2.HTN, elevated lipids, one episode of paroxysmal Afib. On pradaxa and ASA. HCTZ and losartan held since 04-09-13 and hydralazine now prn. She follows blood pressure regularly at home.  3.possible stricture or other anatomic change from rectal resection contributing to difficulty with constipation. She will continue all laxatives now. ? If GI evaluation, possible dilatation would be helpful (not discussed with patient today, but might want to consider with next gyn onc evaluation. 4.hypothyroidism on replacement  5.osteoarthritis, post right hip replacement 2013  6. PAC removed by IR 7. flu shot done 8.mild anemia related to chemotherapy: improved, and all other labs also good 9. Toenail changes: if nails do not improve as they grow out now off chemo, may need PCP or podiatry to help with fungal infection.   Patient had questions answered to her satisfaction and followed discussion well.    [name] P, MD   11/14/2013, 11:01 AM

## 2013-11-23 ENCOUNTER — Other Ambulatory Visit: Payer: Self-pay | Admitting: Oncology

## 2013-11-23 DIAGNOSIS — K624 Stenosis of anus and rectum: Secondary | ICD-10-CM

## 2013-11-23 NOTE — Progress Notes (Signed)
Medical Oncology  Communication from this MD to [name] re ?of possible rectal stricture related to surgery there with the gyn oncology debulking. He agrees that GI referral for evaluation and possible intervention reasonable. I spoke directly with patient by phone now about this, and she is in agreement with referral to GI. She is not established with GI in [place]; I have told her the various groups available and she would like to use [facility], as they did husband's colonoscopy.  Patient understands that this is not an urgent problem and that schedulers may not be back in touch with her for a few days given holiday schedule. She appreciated this suggestion and phone call. Order for GI consultation entered now. Ila Mcgill, MD

## 2013-11-26 ENCOUNTER — Encounter: Payer: Self-pay | Admitting: Gastroenterology

## 2013-11-26 ENCOUNTER — Telehealth: Payer: Self-pay | Admitting: Oncology

## 2013-11-26 NOTE — Telephone Encounter (Signed)
s.w. pt and advised on Jan GI////S.w Christine @ [facility]..the patient sched to see Dr. Christella Hartigan 1.16.15 @ 2:30pm

## 2013-12-11 ENCOUNTER — Ambulatory Visit (INDEPENDENT_AMBULATORY_CARE_PROVIDER_SITE_OTHER): Payer: Medicare Other | Admitting: Cardiology

## 2013-12-11 ENCOUNTER — Encounter: Payer: Self-pay | Admitting: Cardiology

## 2013-12-11 VITALS — BP 150/90 | HR 61 | Ht 63.0 in | Wt 136.8 lb

## 2013-12-11 DIAGNOSIS — I10 Essential (primary) hypertension: Secondary | ICD-10-CM

## 2013-12-11 DIAGNOSIS — I480 Paroxysmal atrial fibrillation: Secondary | ICD-10-CM

## 2013-12-11 DIAGNOSIS — R55 Syncope and collapse: Secondary | ICD-10-CM

## 2013-12-11 DIAGNOSIS — E785 Hyperlipidemia, unspecified: Secondary | ICD-10-CM

## 2013-12-11 DIAGNOSIS — I4891 Unspecified atrial fibrillation: Secondary | ICD-10-CM

## 2013-12-11 NOTE — Progress Notes (Signed)
PATIENT: [name] MRN: [id number]  DOB: Oct 14, 1941   DOV:12/14/2013 PCP: Precious Reel, MD  Clinic Note: Chief Complaint  Patient presents with  . Annual Exam    Only complaint is arthritic pain. Battled Ovarian cancer, had total hysterectomy 01/2013 and chemotherapy.   HPI: [name] is a 73 y.o.  female with a PMH below who presents today for hypertension proximal atrial fibrillation. As best I can tell she is not any episodes of A. fib since 2010 while on propafenone. She remains on only Plavix for anticoagulation.  Since I last saw her, she was diagnosed with a started treatment for malignant ovarian cancer. She's undergone surgical resection as well as chemotherapy. She is had and bouts of chemotherapy related infections. She had several hospitalizations for syncope and infections. Medications were adjusted, we were not consulted. There is an echocardiogram done that was relatively normal.  Interval History: She presents today actually doing fine from an cardiology standpoint. She denies any rapid heart rates, and only notes intermittent/bleeding irregular heartbeats. No recent episodes of lightheadedness and wooziness or syncope/near syncope. No TIA/amaurosis fugax symptoms. No melena hematochezia or hematuria while on Plavix. She denies any chest tightness or pressure/dyspnea with rest or exertion. No PND, orthopnea or edema.  Past Medical History  Diagnosis Date  . Hypertension   . Dyslipidemia   . Hypothyroidism   . Arthritis     OSTEOARTHRITIS   -- CONSTANT PAIN RIGHT HIP---AND PAIN LEFT KNEE--PT STATES SHE GETS INJECTIONS INTO HER KNEE  . Complication of anesthesia     BLOOD PRESSURE DROPPED WITH NASAL SURGERY, ONE OF THE CARPAL TUNNEL REPAIRS AND DURING A COLONOSCOPY  . Ovarian cancer   . PAF (paroxysmal atrial fibrillation)     Only on Plavix -- not full Anticoagulation per pt. request    Prior Cardiac Evaluation and Past Surgical History: Past Surgical  History  Procedure Laterality Date    Procedure: EXPLORATORY LAPAROTOMY TOTAL ABDOMINAL HYSTERECTOMY BILATERAL SALPINGO-OOPHORECTOMY, Partial Rectal Resection with Reanastamosis;  Surgeon: Alvino Chapel, MD;  Location: [facility];  Service: Gynecology;  Laterality: Bilateral;  . Omentectomy  02/13/2013    Procedure: OMENTECTOMY;  Surgeon: Alvino Chapel, MD;  Location: [facility];  Service: Gynecology;;  . Lymphadenectomy Right 02/13/2013    Procedure: PEVLIC  LYMPHADENECTOMY, DEBULKING right pelvic tumor nodules;  Surgeon: Alvino Chapel, MD;  Location: [facility];  Service: Gynecology;  Laterality: Right;  . Total abdominal hysterectomy  01/2013  . Transthoracic echocardiogram  May 2014    Normal LV size function. EF 60-65%. Grade 1 diastolic function. Mild MR and mildly elevated PA pressures of 37 mmHg per  . Nm myoview ltd  June 2010     subbmaximal with no ischemia or infarction.    Allergies  Allergen Reactions  . Codeine Other (See Comments)    Does not like the feeling she gets  . Lisinopril     LIP NUMBNESS    Current Outpatient Prescriptions  Medication Sig Dispense Refill  . aspirin EC 81 MG tablet Take 81 mg by mouth daily.      . bisacodyl (DULCOLAX) 5 MG EC tablet Take 1 tablet (5 mg total) by mouth daily as needed.  30 tablet  0  . clopidogrel (PLAVIX) 75 MG tablet Take 75 mg by mouth at bedtime.       . docusate sodium (COLACE) 100 MG capsule Take 100 mg by mouth 2 (two) times daily.      Marland Kitchen estradiol (  ESTRACE) 1 MG tablet Take 1 tablet (1 mg total) by mouth every morning. PT STATES SHE WANTS ESTRACE--NOT THE GENERIC  90 tablet  3  . Glucosamine HCl (GLUCOSAMINE PO) Take 1,000 mg by mouth daily.      Marland Kitchen levothyroxine (SYNTHROID, LEVOTHROID) 88 MCG tablet Take 88 mcg by mouth every morning. PT STATES SHE NEEDS THE SYNTHROID--DOES NOT WANT THE GENERIC      . metoprolol tartrate (LOPRESSOR) 25 MG tablet Take 25 mg by mouth 2 (two) times daily.      . Multiple  Vitamin (MULTIVITAMIN WITH MINERALS) TABS tablet Take 1 tablet by mouth daily.      Vladimir Faster Glycol-Propyl Glycol (SYSTANE OP) Apply 1 drop to eye 2 (two) times daily.      . polyethylene glycol (MIRALAX / GLYCOLAX) packet Take 17 g by mouth daily.      . potassium chloride (K-DUR) 10 MEQ tablet Take 10 mEq by mouth every Monday, Wednesday, and Friday.      . propafenone (RYTHMOL) 225 MG tablet Take 225 mg by mouth 2 (two) times daily. 9am9pm      . senna (SENOKOT) 8.6 MG tablet Take 1 tablet by mouth 2 (two) times daily.       . Calcium & Magnesium Carbonates (MYLANTA PO) Take by mouth. Prn      . MOVIPREP 100 G SOLR Take 1 kit (200 g total) by mouth once.  1 kit  0   No current facility-administered medications for this visit.    History   Social History Narrative   Married mother of 3, grandmother 60.   Exercises 6/7 days a week walking 30 minutes a time.   Never smoked or drank alcohol.   ROS: A comprehensive Review of Systems - Negative except Mild nausea associated with chemotherapy. She is hoping that "the worst is behind her.  PHYSICAL EXAM BP 150/90  Pulse 61  Ht _0  (1.6 m)  Wt 136 lb 12.8 oz (62.052 kg)  BMI 24.24 kg/m2 General appearance: alert, cooperative, appears stated age and no distress Neck: no adenopathy, no carotid bruit, no JVD and supple, symmetrical, trachea midline Lungs: clear to auscultation bilaterally, normal percussion bilaterally and Nonlabored, good air movement Heart: regular rate and rhythm, S1, S2 normal, no murmur, click, rub or gallop and normal apical impulse Abdomen: soft, non-tender; bowel sounds normal; no masses,  no organomegaly Extremities: extremities normal, atraumatic, no cyanosis or edema Pulses: 2+ and symmetric Neurologic: Alert and oriented X 3, normal strength and tone. Normal symmetric reflexes. Normal coordination and gait  HAL:[id number] today: Yes Rate: 61 , Rhythm: NSR, First AV Block.;   Recent Labs: Reviewed on  Epic*  ASSESSMENT / PLAN: PAF (paroxysmal atrial fibrillation) As best I can tell, she has not had any recurrences since 2010. Well-controlled with propafenone/Rythmol. She had one time in 2013 where she missed a dose of her Rythmol and had a horrible night of palpitations that resolved she should be started Rythmol. Next  Her CHADS2VASC2  scorer as well as caffeinated was 3 based on her age, hypertension and the female. However, she is relatively well controlled her rhythm standpoint and would prefer to stay off of anticoagulation after discussing various oral anticoagulation options between Pradaxa, Xarelto or Eliquis due to cost, and she refuses to use warfarin. Therefore will to be continue Plavix. As this is not an absolute therapy, the Plavix can be stopped for procedures such as colonoscopies or dental procedures. She is no on it  because of having any coronary artery stent, and Plavix is not considered "adequate anticoagulation" for CVA prophylaxis.   Syncope, history of Would seem that her single episode was likely due to hypotension in the setting of her chemotherapy and infections. Not related to arrhythmia. Echocardiogram was normal.  Hypertension Her blood pressure is a little high today, however I would prefer to allow permissive hypertension and not overtreat do to her hypotension related syncopal episode she had in the summertime.    Orders Placed This Encounter  Procedures  . EKG 12-Lead   No orders of the defined types were placed in this encounter.    Followup: One year  Caridad Silveira W. Ellyn Hack, M.D., M.S. [facility] [address] [place], [place]  [address]  [phone] Pager # [phone]

## 2013-12-11 NOTE — Patient Instructions (Signed)
And so happy here that things are going well at your cancer surgery and chemotherapy. Thankfully he says, your heart behaved and did not go into atrial fibrillation during all therapy.  Exam the relatively stable. The prefire linear blood pressure being somewhat elevated for now. We did consider adding back a little below sort in the future but for now I think were fine the blood pressure they are since they're stable at home.  I will see you back in roughly one year, unless the issue arises.  Tina Man, MD  Your physician wants you to follow-up in: 1 yr.   You will receive a reminder letter in the mail two months in advance. If you don't receive a letter, please call our office to schedule the follow-up appointment.

## 2013-12-14 ENCOUNTER — Encounter: Payer: Self-pay | Admitting: Gastroenterology

## 2013-12-14 ENCOUNTER — Encounter: Payer: Self-pay | Admitting: Cardiology

## 2013-12-14 ENCOUNTER — Ambulatory Visit (INDEPENDENT_AMBULATORY_CARE_PROVIDER_SITE_OTHER): Payer: Medicare Other | Admitting: Gastroenterology

## 2013-12-14 ENCOUNTER — Telehealth: Payer: Self-pay

## 2013-12-14 VITALS — BP 150/78 | HR 60 | Ht 63.0 in | Wt 135.0 lb

## 2013-12-14 DIAGNOSIS — R198 Other specified symptoms and signs involving the digestive system and abdomen: Secondary | ICD-10-CM

## 2013-12-14 DIAGNOSIS — K5900 Constipation, unspecified: Secondary | ICD-10-CM

## 2013-12-14 DIAGNOSIS — I480 Paroxysmal atrial fibrillation: Secondary | ICD-10-CM | POA: Insufficient documentation

## 2013-12-14 DIAGNOSIS — E785 Hyperlipidemia, unspecified: Secondary | ICD-10-CM | POA: Insufficient documentation

## 2013-12-14 MED ORDER — MOVIPREP 100 G PO SOLR: 1.0000 | Freq: Once | ORAL | Status: DC

## 2013-12-14 NOTE — Assessment & Plan Note (Signed)
Her blood pressure is a little high today, however I would prefer to allow permissive hypertension and not overtreat do to her hypotension related syncopal episode she had in the summertime.

## 2013-12-14 NOTE — Assessment & Plan Note (Signed)
As best I can tell, she has not had any recurrences since 2010. Well-controlled with propafenone/Rythmol. She had one time in 2013 where she missed a dose of her Rythmol and had a horrible night of palpitations that resolved she should be started Rythmol. Next  Her CHADS2VASC2  scorer as well as caffeinated was 3 based on her age, hypertension and the female. However, she is relatively well controlled her rhythm standpoint and would prefer to stay off of anticoagulation after discussing various oral anticoagulation options between Pradaxa, Xarelto or Eliquis due to cost, and she refuses to use warfarin. Therefore will to be continue Plavix. As this is not an absolute therapy, the Plavix can be stopped for procedures such as colonoscopies or dental procedures. She is no on it because of having any coronary artery stent, and Plavix is not considered "adequate anticoagulation" for CVA prophylaxis.

## 2013-12-14 NOTE — Telephone Encounter (Signed)
[name] has Paroxysmal Atrial Fibrillation & is on long term anticoagulation for CVA prevention.   She can safely have her Plavix stopped -- ~5-7 days prior to her procedure & restarted day 1-2 post-op.  Thanks,  Leonie Man, MD

## 2013-12-14 NOTE — Telephone Encounter (Signed)
  12/14/2013   RE: [name] DOB: 1941-07-30 MRN: [id number]   Dear Dr Ellyn Hack,    We have scheduled the above patient for an endoscopic procedure with Dr Ardis Hughs . Our records show that she is on anticoagulation therapy.   Please advise as to how long the patient may come off her therapy of Plavix prior to the procedure, which is scheduled for 12/21/13.  Please fax back/ or route the completed form to Zorina Mallin at [phone].   Sincerely,    Christian Mate [facility])  Please respond by 12/17/13 and route back to [facility])

## 2013-12-14 NOTE — Assessment & Plan Note (Signed)
Would seem that her single episode was likely due to hypotension in the setting of her chemotherapy and infections. Not related to arrhythmia. Echocardiogram was normal.

## 2013-12-14 NOTE — Progress Notes (Signed)
HPI: This is a   very pleasant 73 year old woman whom I am meeting for the first time today.   Stage IIB right ovarian cancer. She was seen by Dr Skeet Latch on 01-18-13 and taken to surgery by Dr Josephina Shih on 02-13-13, which was TAH/BSO/ omentectomy/ureterolysis/ resection of cul-de-sac tumor/right pelvic lymphadnectomy and resection of rectum with reanastomosis. "The mass was densely adherent to the rectum in order remove it a segment of the anterior rectum was excised. The rectum was then closed with interrupted sutures of 2-0 Vicryl and imbricating sutures of 3-0 silk. Proctoscopy was performed and saline placed in the pelvis proctoscope insufflated the rectum there is no evidence of air leak.  At completion of surgery there was no gross residual disease". Adjuvant chemo with Dr. Marko Plume.  She has been constipated for many years, started miralax 2-3 years ago this worked until chemo.  Chemo started 5 weeks after surgery.    Has been on senekot, colace, miralax since chemo.  Regimine works, does not need to increase her dose.  Feels difficulty starting having a bowel movement. No bleeding  Finished  chemotherapy several months ago     Review of systems: Pertinent positive and negative review of systems were noted in the above HPI section. Complete review of systems was performed and was otherwise normal.    Past Medical History  Diagnosis Date  . Hypertension   . Dyslipidemia   . Hypothyroidism   . Arthritis     OSTEOARTHRITIS   -- CONSTANT PAIN RIGHT HIP---AND PAIN LEFT KNEE--PT STATES SHE GETS INJECTIONS INTO HER KNEE  . Dysrhythmia     HX OF PAROXYSMAL ATRIAL FIB  . Complication of anesthesia     BLOOD PRESSURE DROPPED WITH NASAL SURGERY, ONE OF THE CARPAL TUNNEL REPAIRS AND DURING A COLONOSCOPY  . Ovarian cancer     Past Surgical History  Procedure Laterality Date  . Bilateral carpal tunnel repair  2007  . Dilation and curettage of uterus  1969  . Surgery for ruptured  ovarian cyst  1969  . Rhinoplasty for fractured nose  1986  . Left knee arthroscopy   2011  . Total hip arthroplasty  02/25/2012    Procedure: TOTAL HIP ARTHROPLASTY ANTERIOR APPROACH;  Surgeon: Mcarthur Rossetti, MD;  Location: [facility];  Service: Orthopedics;  Laterality: Right;  . Tonsillectomy  1962  . Laparotomy Bilateral 02/13/2013    Procedure: EXPLORATORY LAPAROTOMY TOTAL ABDOMINAL HYSTERECTOMY BILATERAL SALPINGO-OOPHORECTOMY, Partial Rectal Resection with Reanastamosis;  Surgeon: Alvino Chapel, MD;  Location: [facility];  Service: Gynecology;  Laterality: Bilateral;  . Omentectomy  02/13/2013    Procedure: OMENTECTOMY;  Surgeon: Alvino Chapel, MD;  Location: [facility];  Service: Gynecology;;  . Lymphadenectomy Right 02/13/2013    Procedure: PEVLIC  LYMPHADENECTOMY, DEBULKING right pelvic tumor nodules;  Surgeon: Alvino Chapel, MD;  Location: [facility];  Service: Gynecology;  Laterality: Right;  . Total abdominal hysterectomy  01/2013    Current Outpatient Prescriptions  Medication Sig Dispense Refill  . aspirin EC 81 MG tablet Take 81 mg by mouth daily.      . bisacodyl (DULCOLAX) 5 MG EC tablet Take 1 tablet (5 mg total) by mouth daily as needed.  30 tablet  0  . Calcium & Magnesium Carbonates (MYLANTA PO) Take by mouth. Prn      . clopidogrel (PLAVIX) 75 MG tablet Take 75 mg by mouth at bedtime.       . docusate sodium (COLACE) 100 MG capsule Take 100  mg by mouth 2 (two) times daily.      Marland Kitchen estradiol (ESTRACE) 1 MG tablet Take 1 tablet (1 mg total) by mouth every morning. PT STATES SHE WANTS ESTRACE--NOT THE GENERIC  90 tablet  3  . Glucosamine HCl (GLUCOSAMINE PO) Take 1,000 mg by mouth daily.      Marland Kitchen levothyroxine (SYNTHROID, LEVOTHROID) 88 MCG tablet Take 88 mcg by mouth every morning. PT STATES SHE NEEDS THE SYNTHROID--DOES NOT WANT THE GENERIC      . metoprolol tartrate (LOPRESSOR) 25 MG tablet Take 25 mg by mouth 2 (two) times daily.      . Multiple Vitamin  (MULTIVITAMIN WITH MINERALS) TABS tablet Take 1 tablet by mouth daily.      Vladimir Faster Glycol-Propyl Glycol (SYSTANE OP) Apply 1 drop to eye 2 (two) times daily.      . polyethylene glycol (MIRALAX / GLYCOLAX) packet Take 17 g by mouth daily.      . potassium chloride (K-DUR) 10 MEQ tablet Take 10 mEq by mouth every Monday, Wednesday, and Friday.      . propafenone (RYTHMOL) 225 MG tablet Take 225 mg by mouth 2 (two) times daily. 9am9pm      . senna (SENOKOT) 8.6 MG tablet Take 1 tablet by mouth 2 (two) times daily.        No current facility-administered medications for this visit.    Allergies as of 12/14/2013 - Review Complete 12/14/2013  Allergen Reaction Noted  . Codeine Other (See Comments) 01/31/2013  . Lisinopril  02/21/2012    Family History  Problem Relation Age of Onset  . Hypertension Mother     History   Social History  . Marital Status: Married    Spouse Name: N/A    Number of Children: N/A  . Years of Education: N/A   Occupational History  . Not on file.   Social History Main Topics  . Smoking status: Never Smoker   . Smokeless tobacco: Never Used  . Alcohol Use: No  . Drug Use: No  . Sexual Activity: Not Currently   Other Topics Concern  . Not on file   Social History Narrative  . No narrative on file       Physical Exam: BP 150/78  Pulse 60  Ht 5\' 3"  (1.6 m)  Wt 135 lb (61.236 kg)  BMI 23.92 kg/m2 Constitutional: generally well-appearing Psychiatric: alert and oriented x3 Eyes: extraocular movements intact Mouth: oral pharynx moist, no lesions Neck: supple no lymphadenopathy Cardiovascular: heart regular rate and rhythm Lungs: clear to auscultation bilaterally Abdomen: soft, nontender, nondistended, no obvious ascites, no peritoneal signs, normal bowel sounds Extremities: no lower extremity edema bilaterally Skin: no lesions on visible extremities    Assessment and plan: 73 y.o. female with  change in her bowels since surgery and  chemotherapy for her ovarian cancer  She did require excision of what sounds like a small portion of her rectum, this was not a segmental resection from what I can tell reviewing the operation note adjust the wall of the rectum. Perhaps she has stricture from this. I recommended we proceed with colonoscopy to evaluate for stricture, dilated needed. I will also screen the rest of her colon for colon cancer or colon polyps since it has been 10 years since her last colon cancer screening with colonoscopy. For now she will stay on her triple therapy for constipation.

## 2013-12-14 NOTE — Telephone Encounter (Signed)
Pt has been notified.

## 2013-12-14 NOTE — Patient Instructions (Signed)
You will be set up for a colonoscopy with dilation if appropriate.(for change in bowels) We will contact Dr. Ellyn Hack about holding your plavix for 5 days prior to colonoscopy.

## 2013-12-17 ENCOUNTER — Telehealth: Payer: Self-pay

## 2013-12-17 NOTE — Telephone Encounter (Signed)
Message copied by Barron Alvine on Mon Dec 17, 2013 10:48 AM ------      Message from: Owens Loffler P      Created: Mon Dec 17, 2013 10:17 AM       I just saw EPIC note from her cadiologist.  He says OK to hold plavix for procedures. She'll need it held for 5 days prior.  Thanks                  ----- Message -----         From: Leonie Man, MD         Sent: 12/14/2013   4:18 PM           To: Milus Banister, MD                   ------

## 2013-12-17 NOTE — Telephone Encounter (Signed)
Pt has been notified.

## 2013-12-21 ENCOUNTER — Ambulatory Visit (AMBULATORY_SURGERY_CENTER): Payer: Medicare Other | Admitting: Gastroenterology

## 2013-12-21 ENCOUNTER — Encounter: Payer: Self-pay | Admitting: Gastroenterology

## 2013-12-21 VITALS — BP 128/75 | HR 54 | Temp 96.9°F | Resp 15 | Ht 60.0 in | Wt 135.0 lb

## 2013-12-21 DIAGNOSIS — R198 Other specified symptoms and signs involving the digestive system and abdomen: Secondary | ICD-10-CM

## 2013-12-21 MED ORDER — SODIUM CHLORIDE 0.9 % IV SOLN: 500.0000 mL | INTRAVENOUS | Status: DC

## 2013-12-21 NOTE — Progress Notes (Signed)
Pt states she wishes to try procedure without sedation.  Sedation given upon pt request.  See Doc flowsheet

## 2013-12-21 NOTE — Op Note (Signed)
[facility]  [address]. [place], [address]   COLONOSCOPY PROCEDURE REPORT  PATIENT: [name], [name]  MR#: [id number] BIRTHDATE: 08/07/1941 , 54  yrs. old GENDER: Female ENDOSCOPIST: Milus Banister, MD REFERRED MD:YJWLKH Marko Plume, M.D. PROCEDURE DATE:  12/21/2013 PROCEDURE:   Colonoscopy, diagnostic First Screening Colonoscopy - Avg.  risk and is 50 yrs.  old or older - No.  Prior Negative Screening - Now for repeat screening. N/A  History of Adenoma - Now for follow-up colonoscopy & has been > or = to 3 yrs.  N/A  Polyps Removed Today? No.  Recommend repeat exam, <10 yrs? No. ASA CLASS:   Class II INDICATIONS:bowel changes since around time of ovarian cancer surgery, chemotherapy. MEDICATIONS: Fentanyl 50 mcg IV, Versed 5 mg IV, and These medications were titrated to patient response per physician's verbal order  DESCRIPTION OF PROCEDURE:   After the risks benefits and alternatives of the procedure were thoroughly explained, informed consent was obtained.  A digital rectal exam revealed no abnormalities of the rectum.   The LB [id number]  endoscope was introduced through the anus and advanced to the cecum, which was identified by both the appendix and ileocecal valve. No adverse events experienced.   The quality of the prep was excellent.  The instrument was then slowly withdrawn as the colon was fully examined.  COLON FINDINGS: There was melanosis staining of the colon.  The site of previous "anterior rectum resection" during ovarian cancer surgery was clearly located.  This linear scar is causing no stenosis or stricture.  The left colon is redundant, a bit more tortuous than usual.  The examination was otherwise normal. Retroflexed views revealed no abnormalities. The time to cecum=5 minutes 31 seconds.  Withdrawal time=7 minutes 25 seconds.  The scope was withdrawn and the procedure completed. COMPLICATIONS: There were no  complications.  ENDOSCOPIC IMPRESSION: There was melanosis staining of the colon.  The site of previous "anterior rectum resection" during ovarian cancer surgery was clearly located.  This linear scar is causing no stenosis or stricture.  The left colon is redundant, a bit more tortuous than usual.  The examination was otherwise normal.  No polyps or cancers.  RECOMMENDATIONS: Please begin taking citrucel fiber supplement, once daily.  Try to cut back on the senekot if possible.  Please call Dr.  Ardis Hughs' office in 4-5 weeks to let us know if those changes are helping your bowel habits.   eSigned:  Milus Banister, MD 12/21/2013 12:03 PM

## 2013-12-21 NOTE — Patient Instructions (Signed)
Restart your plavix today. Take citrucel fiber supplement once daily.    YOU HAD AN ENDOSCOPIC PROCEDURE TODAY AT [facility]: Refer to the procedure report that was given to you for any specific questions about what was found during the examination.  If the procedure report does not answer your questions, please call your gastroenterologist to clarify.  If you requested that your care partner not be given the details of your procedure findings, then the procedure report has been included in a sealed envelope for you to review at your convenience later.  YOU SHOULD EXPECT: Some feelings of bloating in the abdomen. Passage of more gas than usual.  Walking can help get rid of the air that was put into your GI tract during the procedure and reduce the bloating. If you had a lower endoscopy (such as a colonoscopy or flexible sigmoidoscopy) you may notice spotting of blood in your stool or on the toilet paper. If you underwent a bowel prep for your procedure, then you may not have a normal bowel movement for a few days.  DIET: Your first meal following the procedure should be a light meal and then it is ok to progress to your normal diet.  A half-sandwich or bowl of soup is an example of a good first meal.  Heavy or fried foods are harder to digest and may make you feel nauseous or bloated.  Likewise meals heavy in dairy and vegetables can cause extra gas to form and this can also increase the bloating.  Drink plenty of fluids but you should avoid alcoholic beverages for 24 hours.  ACTIVITY: Your care partner should take you home directly after the procedure.  You should plan to take it easy, moving slowly for the rest of the day.  You can resume normal activity the day after the procedure however you should NOT DRIVE or use heavy machinery for 24 hours (because of the sedation medicines used during the test).    SYMPTOMS TO REPORT IMMEDIATELY: A gastroenterologist can be reached at any  hour.  During normal business hours, 8:30 AM to 5:00 PM Monday through Friday, call [phone].  After hours and on weekends, please call the GI answering service at [phone] who will take a message and have the physician on call contact you.   Following lower endoscopy (colonoscopy or flexible sigmoidoscopy):  Excessive amounts of blood in the stool  Significant tenderness or worsening of abdominal pains  Swelling of the abdomen that is new, acute  Fever of 100F or higher  FOLLOW UP: If any biopsies were taken you will be contacted by phone or by letter within the next 1-3 weeks.  Call your gastroenterologist if you have not heard about the biopsies in 3 weeks.  Our staff will call the home number listed on your records the next business day following your procedure to check on you and address any questions or concerns that you may have at that time regarding the information given to you following your procedure. This is a courtesy call and so if there is no answer at the home number and we have not heard from you through the emergency physician on call, we will assume that you have returned to your regular daily activities without incident.  SIGNATURES/CONFIDENTIALITY: You and/or your care partner have signed paperwork which will be entered into your electronic medical record.  These signatures attest to the fact that that the information above on your After Visit Summary has  been reviewed and is understood.  Full responsibility of the confidentiality of this discharge information lies with you and/or your care-partner.

## 2013-12-24 ENCOUNTER — Telehealth: Payer: Self-pay | Admitting: *Deleted

## 2013-12-24 NOTE — Telephone Encounter (Signed)
  Follow up Call-  Call back number 12/21/2013  Post procedure Call Back phone  # [phone]  Permission to leave phone message Yes     Patient questions:  Do you have a fever, pain , or abdominal swelling? no Pain Score  0 *  Have you tolerated food without any problems? yes  Have you been able to return to your normal activities? yes  Do you have any questions about your discharge instructions: Diet   no Medications  no Follow up visit  no  Do you have questions or concerns about your Care? no  Actions: * If pain score is 4 or above: No action needed, pain <4.

## 2014-01-08 ENCOUNTER — Ambulatory Visit (INDEPENDENT_AMBULATORY_CARE_PROVIDER_SITE_OTHER): Payer: Medicare Other | Admitting: Gynecology

## 2014-01-08 ENCOUNTER — Telehealth: Payer: Self-pay | Admitting: *Deleted

## 2014-01-08 ENCOUNTER — Encounter: Payer: Self-pay | Admitting: Gynecology

## 2014-01-08 DIAGNOSIS — N63 Unspecified lump in unspecified breast: Secondary | ICD-10-CM

## 2014-01-08 DIAGNOSIS — N6320 Unspecified lump in the left breast, unspecified quadrant: Secondary | ICD-10-CM

## 2014-01-08 NOTE — Telephone Encounter (Signed)
Message copied by Thamas Jaegers on Tue Jan 08, 2014  4:05 PM ------      Message from: Anastasio Auerbach      Created: Tue Jan 08, 2014  3:52 PM       Schedule diagnostic mammography and left breast ultrasound at breast center reference new onset left breast mass 9:00 position periphery of the breast ------

## 2014-01-08 NOTE — Telephone Encounter (Signed)
Orders placed at breast center they will contact patient.

## 2014-01-08 NOTE — Patient Instructions (Signed)
Office will call you to arrange mammogram and ultrasound.

## 2014-01-08 NOTE — Progress Notes (Signed)
Patient ID: [name], female   DOB: [birth year], 73 y.o.   MRN: [id number]  Patient presents with a one-week history of lateral left breast discomfort and over the last 2 days had felt a small nodule in the left lateral breast. No history of this before. Normal mammogram 02/2013.  Exam with Irven Shelling Both breast examined lying and sitting. Right without masses retractions discharge adenopathy. Left with small pea size mobile firm nodule 9:00 position periphery of the breast. No overlying skin changes, nipple discharge or axillary adenopathy. Physical Exam  Pulmonary/Chest:      Assessment and plan: New onset left breast nodule. Start with diagnostic mammography and ultrasound. Various scenarios and possibilities reviewed with the patient to include observation up to and including excisional biopsy. Patient will followup for these studies and ultimately we will triage based on these results. Patient knows importance of followup.

## 2014-01-11 NOTE — Telephone Encounter (Signed)
appt on 01/23/14 @ 9:45 am

## 2014-01-23 ENCOUNTER — Other Ambulatory Visit: Payer: Medicare Other

## 2014-01-24 ENCOUNTER — Other Ambulatory Visit: Payer: Medicare Other

## 2014-02-01 ENCOUNTER — Ambulatory Visit
Admission: RE | Admit: 2014-02-01 | Discharge: 2014-02-01 | Disposition: A | Discharge status: Home / Self Care | Payer: Medicare Other | Source: Ambulatory Visit | Attending: Gynecology | Admitting: Gynecology

## 2014-02-01 ENCOUNTER — Telehealth: Payer: Self-pay | Admitting: Oncology

## 2014-02-01 ENCOUNTER — Other Ambulatory Visit: Payer: Self-pay

## 2014-02-01 DIAGNOSIS — N6320 Unspecified lump in the left breast, unspecified quadrant: Secondary | ICD-10-CM

## 2014-02-01 DIAGNOSIS — Z1231 Encounter for screening mammogram for malignant neoplasm of breast: Secondary | ICD-10-CM

## 2014-02-01 NOTE — Telephone Encounter (Signed)
lvm for pt regarding to June appt moved to 6.15.15 pt MD request.....mailed pt appt sched/avs and letter

## 2014-02-04 ENCOUNTER — Other Ambulatory Visit (HOSPITAL_BASED_OUTPATIENT_CLINIC_OR_DEPARTMENT_OTHER): Payer: Medicare Other

## 2014-02-04 DIAGNOSIS — C569 Malignant neoplasm of unspecified ovary: Secondary | ICD-10-CM

## 2014-02-04 LAB — CBC WITH DIFFERENTIAL/PLATELET
BASO%: 0.4 % (ref 0.0–2.0)
BASOS ABS: 0 10*3/uL (ref 0.0–0.1)
EOS ABS: 0 10*3/uL (ref 0.0–0.5)
EOS%: 0.3 % (ref 0.0–7.0)
HCT: 38.4 % (ref 34.8–46.6)
HEMOGLOBIN: 13.1 g/dL (ref 11.6–15.9)
LYMPH%: 36.4 % (ref 14.0–49.7)
MCH: 34.1 pg — ABNORMAL HIGH (ref 25.1–34.0)
MCHC: 34.1 g/dL (ref 31.5–36.0)
MCV: 99.8 fL (ref 79.5–101.0)
MONO#: 0.5 10*3/uL (ref 0.1–0.9)
MONO%: 8.1 % (ref 0.0–14.0)
NEUT#: 3.7 10*3/uL (ref 1.5–6.5)
NEUT%: 54.8 % (ref 38.4–76.8)
Platelets: 131 10*3/uL — ABNORMAL LOW (ref 145–400)
RBC: 3.85 10*6/uL (ref 3.70–5.45)
RDW: 12.7 % (ref 11.2–14.5)
WBC: 6.8 10*3/uL (ref 3.9–10.3)
lymph#: 2.5 10*3/uL (ref 0.9–3.3)

## 2014-02-08 ENCOUNTER — Ambulatory Visit: Payer: Medicare Other | Attending: Gynecology | Admitting: Gynecology

## 2014-02-08 ENCOUNTER — Encounter: Payer: Self-pay | Admitting: Gynecology

## 2014-02-08 ENCOUNTER — Ambulatory Visit: Payer: Medicare Other

## 2014-02-08 ENCOUNTER — Telehealth: Payer: Self-pay | Admitting: *Deleted

## 2014-02-08 VITALS — BP 144/64 | HR 60 | Temp 97.5°F | Resp 18

## 2014-02-08 DIAGNOSIS — I4891 Unspecified atrial fibrillation: Secondary | ICD-10-CM | POA: Insufficient documentation

## 2014-02-08 DIAGNOSIS — I10 Essential (primary) hypertension: Secondary | ICD-10-CM | POA: Insufficient documentation

## 2014-02-08 DIAGNOSIS — E039 Hypothyroidism, unspecified: Secondary | ICD-10-CM | POA: Insufficient documentation

## 2014-02-08 DIAGNOSIS — N838 Other noninflammatory disorders of ovary, fallopian tube and broad ligament: Secondary | ICD-10-CM

## 2014-02-08 DIAGNOSIS — Z7982 Long term (current) use of aspirin: Secondary | ICD-10-CM | POA: Insufficient documentation

## 2014-02-08 DIAGNOSIS — E785 Hyperlipidemia, unspecified: Secondary | ICD-10-CM | POA: Insufficient documentation

## 2014-02-08 DIAGNOSIS — C569 Malignant neoplasm of unspecified ovary: Secondary | ICD-10-CM | POA: Insufficient documentation

## 2014-02-08 DIAGNOSIS — Z9221 Personal history of antineoplastic chemotherapy: Secondary | ICD-10-CM | POA: Insufficient documentation

## 2014-02-08 DIAGNOSIS — Z79899 Other long term (current) drug therapy: Secondary | ICD-10-CM | POA: Insufficient documentation

## 2014-02-08 NOTE — Telephone Encounter (Signed)
Called pt's insurance co per pt request as they will not cover her Estrace. Called pt's Insurance for pre-authorization on Estrace approved on 02/08/14 through 11/28/14. GPI/NDC #: [id number]. Called notified pt, who advised she had already picked up the medication last week, she would like the copay reduced. Discussed with pt insurance company will not reduce medication price. Reduced price is the generic. Pt unable to take generic. No further concerns.

## 2014-02-08 NOTE — Addendum Note (Signed)
Addended by: Joylene John D on: 02/08/2014 10:58 AM   Modules accepted: Orders

## 2014-02-08 NOTE — Progress Notes (Signed)
Consult Note: Gyn-Onc   [name] 73 y.o. female  Chief Complaint  Patient presents with  . Ovarian Cancer    Assessment : Stage IIb poorly differentiated ovarian cancer. Clinically free of disease.  Plan:    Patient return to see Dr. Marko Plume. in 3 months and see me in 6 months. CA 125 will be obtained today.. The patient is given instructions and warning signs regarding recurrent ovarian cancer although her prognosis is very good.  Interval History:   The patient returns today as previously scheduled. Overall she's been doing well although she still says she is fatigued in the afternoons and takes a nap. The patient has no abdominal symptoms. She specifically has no GI or GU symptoms except for chronic constipation.. Functional status is good. She does not seem to have any peripheral neuropathy.  HPI:The patient initially presented with a pelvic mass and elevated CA 125 (178 units per mL) She underwent exploratory laparotomy and debulking on 02/13/2013. Final pathology showed a poorly differentiated ovarian cancer involving both ovaries pelvic peritoneum and rectal muscularis. All gross disease was resected.  She then received 6 cycles of carboplatin and Taxol chemotherapy completed in September 2014. At the completion of chemotherapy her CA 125 was 18 units per mL.   Review of Systems:10 point review of systems is negative except as noted in interval history.   Vitals: Blood pressure 144/64, pulse 60, temperature 97.5 F (36.4 C), temperature source Oral, resp. rate 18.  Physical Exam: General : The patient is a healthy woman in no acute distress.  HEENT: normocephalic, extraoccular movements normal; neck is supple without thyromegally  Lynphnodes: Supraclavicular and inguinal nodes not enlarged  Abdomen: Soft, non-tender, no ascites, no organomegally, no masses, no hernias , midline incision is healing well  Pelvic:    EGBUS: Normal female  Vagina bladder urethra:  Normal  Vaginal cuff is well healed.  Cervix and uterus are surgically absent  Bimanual exam: No masses nodularity or fullness.  Rectovaginal exam confirms      Lower extremities: No edema or varicosities. Normal range of motion      Allergies  Allergen Reactions  . Codeine Other (See Comments)    Does not like the feeling she gets  . Lisinopril     LIP NUMBNESS    Past Medical History  Diagnosis Date  . Hypertension   . Dyslipidemia   . Hypothyroidism   . Arthritis     OSTEOARTHRITIS   -- CONSTANT PAIN RIGHT HIP---AND PAIN LEFT KNEE--PT STATES SHE GETS INJECTIONS INTO HER KNEE  . Complication of anesthesia     BLOOD PRESSURE DROPPED WITH NASAL SURGERY, ONE OF THE CARPAL TUNNEL REPAIRS AND DURING A COLONOSCOPY  . Ovarian cancer   . PAF (paroxysmal atrial fibrillation)     Only on Plavix -- not full Anticoagulation per pt. request    Past Surgical History  Procedure Laterality Date  . Bilateral carpal tunnel repair  2007  . Dilation and curettage of uterus  1969  . Surgery for ruptured ovarian cyst  1969  . Rhinoplasty for fractured nose  1986  . Left knee arthroscopy   2011  . Total hip arthroplasty  02/25/2012    Procedure: TOTAL HIP ARTHROPLASTY ANTERIOR APPROACH;  Surgeon: Mcarthur Rossetti, MD;  Location: [facility];  Service: Orthopedics;  Laterality: Right;  . Tonsillectomy  1962  . Laparotomy Bilateral 02/13/2013    Procedure: EXPLORATORY LAPAROTOMY TOTAL ABDOMINAL HYSTERECTOMY BILATERAL SALPINGO-OOPHORECTOMY, Partial Rectal Resection with Reanastamosis;  Surgeon: Alvino Chapel, MD;  Location: [facility];  Service: Gynecology;  Laterality: Bilateral;  . Omentectomy  02/13/2013    Procedure: OMENTECTOMY;  Surgeon: Alvino Chapel, MD;  Location: [facility];  Service: Gynecology;;  . Lymphadenectomy Right 02/13/2013    Procedure: PEVLIC  LYMPHADENECTOMY, DEBULKING right pelvic tumor nodules;  Surgeon: Alvino Chapel, MD;  Location: [facility];   Service: Gynecology;  Laterality: Right;  . Total abdominal hysterectomy  01/2013  . Transthoracic echocardiogram  May 2014    Normal LV size function. EF 60-65%. Grade 1 diastolic function. Mild MR and mildly elevated PA pressures of 37 mmHg per  . Nm myoview ltd  June 2010     subbmaximal with no ischemia or infarction.    Current Outpatient Prescriptions  Medication Sig Dispense Refill  . aspirin EC 81 MG tablet Take 81 mg by mouth daily.      . Calcium & Magnesium Carbonates (MYLANTA PO) Take by mouth. Prn      . clopidogrel (PLAVIX) 75 MG tablet Take 75 mg by mouth at bedtime.       . docusate sodium (COLACE) 100 MG capsule Take 100 mg by mouth 2 (two) times daily.      Marland Kitchen estradiol (ESTRACE) 1 MG tablet Take 1 tablet (1 mg total) by mouth every morning. PT STATES SHE WANTS ESTRACE--NOT THE GENERIC  90 tablet  3  . Glucosamine HCl (GLUCOSAMINE PO) Take 1,000 mg by mouth daily.      Marland Kitchen levothyroxine (SYNTHROID, LEVOTHROID) 88 MCG tablet Take 88 mcg by mouth every morning. PT STATES SHE NEEDS THE SYNTHROID--DOES NOT WANT THE GENERIC      . methylPREDNIsolone (MEDROL DOSPACK) 4 MG tablet       . metoprolol tartrate (LOPRESSOR) 25 MG tablet Take 25 mg by mouth 2 (two) times daily.      Marland Kitchen MOVIPREP 100 G SOLR       . Multiple Vitamin (MULTIVITAMIN WITH MINERALS) TABS tablet Take 1 tablet by mouth daily.      Vladimir Faster Glycol-Propyl Glycol (SYSTANE OP) Apply 1 drop to eye 2 (two) times daily.      . polyethylene glycol (MIRALAX / GLYCOLAX) packet Take 17 g by mouth daily.      . potassium chloride (K-DUR) 10 MEQ tablet Take 10 mEq by mouth every Monday, Wednesday, and Friday.      . propafenone (RYTHMOL) 225 MG tablet Take 225 mg by mouth 2 (two) times daily. 9am9pm       No current facility-administered medications for this visit.    History   Social History  . Marital Status: Married    Spouse Name: N/A    Number of Children: N/A  . Years of Education: N/A   Occupational History   . Not on file.   Social History Main Topics  . Smoking status: Never Smoker   . Smokeless tobacco: Never Used  . Alcohol Use: No  . Drug Use: No  . Sexual Activity: Not Currently   Other Topics Concern  . Not on file   Social History Narrative   Married mother of 3, grandmother 41.   Exercises 6/7 days a week walking 30 minutes a time.   Never smoked or drank alcohol.    Family History  Problem Relation Age of Onset  . Hypertension Mother       Alvino Chapel, MD 02/08/2014, 10:12 AM

## 2014-02-08 NOTE — Patient Instructions (Addendum)
Plan to follow up in six months or sooner if needed.  We will contact you with the results of your CA 125 from today.  Please call for any questions or concerns.  Please call in May or June to schedule your appt for Sept 2015.

## 2014-02-09 LAB — CA 125: CA 125: 9.7 U/mL (ref 0.0–30.2)

## 2014-02-11 ENCOUNTER — Telehealth: Payer: Self-pay | Admitting: Gynecologic Oncology

## 2014-02-11 ENCOUNTER — Ambulatory Visit: Payer: Medicare Other | Attending: Orthopaedic Surgery

## 2014-02-11 DIAGNOSIS — M545 Low back pain, unspecified: Secondary | ICD-10-CM | POA: Insufficient documentation

## 2014-02-11 DIAGNOSIS — IMO0001 Reserved for inherently not codable concepts without codable children: Secondary | ICD-10-CM | POA: Insufficient documentation

## 2014-02-11 DIAGNOSIS — R5381 Other malaise: Secondary | ICD-10-CM | POA: Insufficient documentation

## 2014-02-11 NOTE — Telephone Encounter (Signed)
Patient informed of CA 125 results.  No concerns voiced.  Advised to call for any questions or concerns.

## 2014-02-12 ENCOUNTER — Ambulatory Visit: Payer: Medicare Other | Admitting: Physical Therapy

## 2014-02-18 ENCOUNTER — Ambulatory Visit: Payer: Medicare Other | Admitting: Physical Therapy

## 2014-02-25 ENCOUNTER — Ambulatory Visit: Payer: Medicare Other

## 2014-03-04 ENCOUNTER — Ambulatory Visit: Payer: Medicare Other

## 2014-03-11 ENCOUNTER — Other Ambulatory Visit: Payer: Self-pay | Admitting: Gynecology

## 2014-03-11 ENCOUNTER — Ambulatory Visit
Admission: RE | Admit: 2014-03-11 | Discharge: 2014-03-11 | Disposition: A | Discharge status: Home / Self Care | Payer: Medicare Other | Source: Ambulatory Visit

## 2014-03-11 DIAGNOSIS — Z1231 Encounter for screening mammogram for malignant neoplasm of breast: Secondary | ICD-10-CM

## 2014-03-11 DIAGNOSIS — N63 Unspecified lump in unspecified breast: Secondary | ICD-10-CM

## 2014-03-12 ENCOUNTER — Telehealth: Payer: Self-pay | Admitting: Cardiology

## 2014-03-12 ENCOUNTER — Ambulatory Visit
Admission: RE | Admit: 2014-03-12 | Discharge: 2014-03-12 | Disposition: A | Discharge status: Home / Self Care | Payer: Medicare Other | Source: Ambulatory Visit | Attending: Gynecology | Admitting: Gynecology

## 2014-03-12 DIAGNOSIS — N63 Unspecified lump in unspecified breast: Secondary | ICD-10-CM

## 2014-03-12 NOTE — Telephone Encounter (Signed)
Dr Glennon Mac with the Breast center is planning a biopsy on the 22nd and needs to get clearance to discontinue Plavis prior to proc.  Please refer to his dictated note of 4/14  He needs to get this approval not later than Wednesday 4/15

## 2014-03-12 NOTE — Telephone Encounter (Signed)
Ok to stop Plavix (would hold x 5 days & restart when safe post-op).  Is not on it for stents & only minimal benefit from Plavix for CVA prevention with Afib. I do not see a note from a Dr. Glennon Mac in the chart.  Leonie Man, MD '

## 2014-03-13 ENCOUNTER — Inpatient Hospital Stay: Admission: RE | Admit: 2014-03-13 | Payer: Medicare Other | Source: Ambulatory Visit

## 2014-03-19 ENCOUNTER — Other Ambulatory Visit: Payer: Medicare Other

## 2014-03-19 ENCOUNTER — Other Ambulatory Visit: Payer: Self-pay | Admitting: Gynecology

## 2014-03-19 ENCOUNTER — Ambulatory Visit
Admission: RE | Admit: 2014-03-19 | Discharge: 2014-03-19 | Disposition: A | Discharge status: Home / Self Care | Payer: Medicare Other | Source: Ambulatory Visit | Attending: Gynecology | Admitting: Gynecology

## 2014-03-19 DIAGNOSIS — N63 Unspecified lump in unspecified breast: Secondary | ICD-10-CM

## 2014-03-19 HISTORY — PX: BREAST BIOPSY: SHX20

## 2014-03-20 ENCOUNTER — Inpatient Hospital Stay: Admission: RE | Admit: 2014-03-20 | Payer: Medicare Other | Source: Ambulatory Visit

## 2014-04-12 ENCOUNTER — Telehealth: Payer: Self-pay | Admitting: Oncology

## 2014-04-12 NOTE — Telephone Encounter (Signed)
pt called to r/s lab to 1wk b4 visit done...pt aware of d.t

## 2014-05-06 ENCOUNTER — Other Ambulatory Visit (HOSPITAL_BASED_OUTPATIENT_CLINIC_OR_DEPARTMENT_OTHER): Payer: Medicare Other

## 2014-05-06 DIAGNOSIS — C569 Malignant neoplasm of unspecified ovary: Secondary | ICD-10-CM

## 2014-05-06 LAB — CBC WITH DIFFERENTIAL/PLATELET
BASO%: 0.7 % (ref 0.0–2.0)
Basophils Absolute: 0 10*3/uL (ref 0.0–0.1)
EOS ABS: 0 10*3/uL (ref 0.0–0.5)
EOS%: 0.7 % (ref 0.0–7.0)
HCT: 40.6 % (ref 34.8–46.6)
HGB: 13.9 g/dL (ref 11.6–15.9)
LYMPH%: 23.5 % (ref 14.0–49.7)
MCH: 34.6 pg — ABNORMAL HIGH (ref 25.1–34.0)
MCHC: 34.2 g/dL (ref 31.5–36.0)
MCV: 101.1 fL — ABNORMAL HIGH (ref 79.5–101.0)
MONO#: 0.5 10*3/uL (ref 0.1–0.9)
MONO%: 6.9 % (ref 0.0–14.0)
NEUT%: 68.2 % (ref 38.4–76.8)
NEUTROS ABS: 4.8 10*3/uL (ref 1.5–6.5)
PLATELETS: 146 10*3/uL (ref 145–400)
RBC: 4.02 10*6/uL (ref 3.70–5.45)
RDW: 12.8 % (ref 11.2–14.5)
WBC: 7 10*3/uL (ref 3.9–10.3)
lymph#: 1.6 10*3/uL (ref 0.9–3.3)

## 2014-05-06 LAB — COMPREHENSIVE METABOLIC PANEL (CC13)
ALT: 19 U/L (ref 0–55)
ANION GAP: 8 meq/L (ref 3–11)
AST: 19 U/L (ref 5–34)
Albumin: 3.8 g/dL (ref 3.5–5.0)
Alkaline Phosphatase: 72 U/L (ref 40–150)
BILIRUBIN TOTAL: 0.71 mg/dL (ref 0.20–1.20)
BUN: 11.1 mg/dL (ref 7.0–26.0)
CO2: 29 meq/L (ref 22–29)
CREATININE: 0.7 mg/dL (ref 0.6–1.1)
Calcium: 9.1 mg/dL (ref 8.4–10.4)
Chloride: 101 mEq/L (ref 98–109)
GLUCOSE: 86 mg/dL (ref 70–140)
Potassium: 3.8 mEq/L (ref 3.5–5.1)
Sodium: 138 mEq/L (ref 136–145)
Total Protein: 6.7 g/dL (ref 6.4–8.3)

## 2014-05-07 LAB — CA 125: CA 125: 11.4 U/mL (ref 0.0–30.2)

## 2014-05-12 ENCOUNTER — Other Ambulatory Visit: Payer: Self-pay | Admitting: Oncology

## 2014-05-13 ENCOUNTER — Other Ambulatory Visit: Payer: Medicare Other

## 2014-05-13 ENCOUNTER — Encounter: Payer: Self-pay | Admitting: Oncology

## 2014-05-13 ENCOUNTER — Ambulatory Visit (HOSPITAL_BASED_OUTPATIENT_CLINIC_OR_DEPARTMENT_OTHER): Payer: Medicare Other | Admitting: Oncology

## 2014-05-13 ENCOUNTER — Telehealth: Payer: Self-pay | Admitting: Oncology

## 2014-05-13 VITALS — BP 142/82 | HR 68 | Temp 98.2°F | Resp 18 | Ht 60.0 in | Wt 134.8 lb

## 2014-05-13 DIAGNOSIS — C569 Malignant neoplasm of unspecified ovary: Secondary | ICD-10-CM

## 2014-05-13 DIAGNOSIS — I10 Essential (primary) hypertension: Secondary | ICD-10-CM

## 2014-05-13 DIAGNOSIS — E039 Hypothyroidism, unspecified: Secondary | ICD-10-CM

## 2014-05-13 NOTE — Progress Notes (Signed)
OFFICE PROGRESS NOTE   05/13/2014   Physicians:D. ClarkePearson; J.Russo, T.Fontaine, Glenetta Hew (cardiology), [name]. Last gastroenterologist in [place].   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 She saw Dr Josephina Shih last in March and will see him again in ~ Sept. Last imaging was CT AP 08-2013. She is feeling very well, with no complaints that seem referable to the gyn cancer or that treatment.  She had left breast biopsy at [facility] on 03-25-14, benign fibroadenoma. She is to see Dr Virgina Jock on 05-17-14. I have given her copies of CBC, CMET and CA125 from 05-06-14 to take to Dr Virgina Jock.  She no longer has PAC  ONCOLOGIC HISTORY   Malignant neoplasm of ovary   03/11/2013 Initial Diagnosis Malignant neoplasm of ovary  She had ~ 2 weeks vaginal spotting in late Dec 2013, then presented to Dr Abbie Sons in late Jan 2014 after bright red spotting that AM, with uterus larger than had been apparent on exam in Oct 2013. Sonohystogram 01-05-13 showed uterus normal size and echotexture, endometrium 4.3 mm, left ovary normal and right adnexa with 1.1 x 8.4.x8.2 cm cystic and solid mass. Endometrial biopsy benign and CA 125 also on 01-05-13 was 178.8. She had CT AP 01-17-13 with 1.0 x 6.9 x 8.9 cm complex right ovarian mass, no ascites, small retroperitoneal nodes. She was seen by Dr Skeet Latch on 01-18-13 and taken to surgery by Dr Josephina Shih on 02-13-13, which was TAH/BSO/ omentectomy/ureterolysis/ resection of cul-de-sac tumor/right pelvic lymphadnectomy and resection of rectum with reanastomosis. At completion of surgery there was no gross residual disease. 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 Washings [phone]) had rare clusters of atypical cells. Chemotherapy with dose dense taxol carboplatin was begun day 1 cycle 1 on 03-20-13; ANC was 1.1 on day 15 cycle 1 with taxol given and neupogen added 04-04-13. She had day 1 cycle 2 treatment on 04-17-13, then was briefly hospitalized 5-22 to 04-20-13 after syncopal episode, with antihypertensive agents DCd and UTI treated. She was feeling much better at time of "day 8" cycle 2 on 05-01-13 and did have neupogen 300 mcg x 1 dose on 05-02-13. She was readmitted to hospital 6-5 thru 05-05-13 with fever, empirically on antibiotics and blood cultures negative. She then went on planned beach vacation such that day 1 cycle 3 was administered on 05-22-13, with neupogen. Day 8 cycle 4 was delayed due to low ANC. Cycle 6 completed 08-14-13. CT AP 09-25-13 had no evidence of cancer and CA 125 was 18.3 on 07-24-2013.   Review of systems as above, also: No recent infectious illness. Energy and appetite good. No bleeding. No SOB. No new or different pain. No LE swelling. No change in bowels, no abdominal or pelvic discomfort. Remainder of 10 point Review of Systems negative.  Objective:  Vital signs in last 24 hours:  BP 142/82  Pulse 68  Temp(Src) 98.2 F (36.8 C) (Oral)  Resp 18  Ht 5' (1.524 m)  Wt 134 lb 12.8 oz (61.145 kg)  BMI 26.33 kg/m2 weight is down 1 lb  Alert, oriented and appropriate. Ambulatory without difficulty.  No alopecia  HEENT:PERRL, sclerae not icteric. Oral mucosa  moist without lesions, posterior pharynx clear.  Neck supple. No JVD.  Lymphatics:no cervical,suraclavicular, axillary or inguinal adenopathy Resp: clear to auscultation bilaterally and normal percussion bilaterally Cardio: regular rate and rhythm. No gallop. GI: soft, nontender, not distended, no mass or organomegaly. Normally active bowel sounds.  Surgical incision not remarkable. Musculoskeletal/ Extremities: without pitting edema, cords, tenderness Neuro: no peripheral neuropathy. Otherwise nonfocal Skin without rash, ecchymosis, petechiae Breasts: Rounded, smooth marble-sized area lateral left breast biopsy proven fibroadenoma, otherwise without dominant mass, skin or nipple findings. Axillae benign  Lab Results:  Results for orders placed in visit on 05/06/14  CBC WITH DIFFERENTIAL      Result Value Ref Range   WBC 7.0  3.9 - 10.3 10e3/uL   NEUT# 4.8  1.5 - 6.5 10e3/uL   HGB 13.9  11.6 - 15.9 g/dL   HCT 40.6  34.8 - 46.6 %   Platelets 146  145 - 400 10e3/uL   MCV 101.1 (*) 79.5 - 101.0 fL   MCH 34.6 (*) 25.1 - 34.0 pg   MCHC 34.2  31.5 - 36.0 g/dL   RBC 4.02  3.70 - 5.45 10e6/uL   RDW 12.8  11.2 - 14.5 %   lymph# 1.6  0.9 - 3.3 10e3/uL   MONO# 0.5  0.1 - 0.9 10e3/uL   Eosinophils Absolute 0.0  0.0 - 0.5 10e3/uL   Basophils Absolute 0.0  0.0 - 0.1 10e3/uL   NEUT% 68.2  38.4 - 76.8 %   LYMPH% 23.5  14.0 - 49.7 %   MONO% 6.9  0.0 - 14.0 %   EOS% 0.7  0.0 - 7.0 %   BASO% 0.7  0.0 - 2.0 %  COMPREHENSIVE METABOLIC PANEL (HB71)      Result Value Ref Range   Sodium 138  136 - 145 mEq/L   Potassium 3.8  3.5 - 5.1 mEq/L   Chloride 101  98 - 109 mEq/L   CO2 29  22 - 29 mEq/L   Glucose 86  70 - 140 mg/dl   BUN 11.1  7.0 - 26.0 mg/dL   Creatinine 0.7  0.6 - 1.1 mg/dL   Total Bilirubin 0.71  0.20 - 1.20 mg/dL   Alkaline Phosphatase 72  40 - 150 U/L   AST 19  5 - 34 U/L   ALT 19  0 - 55 U/L   Total Protein 6.7  6.4 - 8.3 g/dL   Albumin 3.8  3.5 - 5.0 g/dL   Calcium 9.1  8.4 - 10.4 mg/dL   Anion Gap 8  3 - 11 mEq/L  CA 125      Result Value Ref Range   CA 125 11.4  0.0 - 30.2 U/mL     Studies/Results: DIGITAL SCREENING BILATERAL MAMMOGRAM WITH 3D TOMO WITH CAD  [facility] D[facility]ITAL BREAST TOMOSYNTHESIS  Digital breast tomosynthesis images are acquired in two projections.  These images are reviewed in  combination with the digital mammogram,  confirming the findings below.  COMPARISON: 02/01/2014, 03/07/2013, 02/23/2012, 02/22/2011.  ACR Breast Density Category b: There are scattered areas of  fibroglandular density.  FINDINGS:  In the left breast, a possible mass warrants further evaluation with  ultrasound. In the right breast, no findings suspicious for  malignancy. Images were processed with CAD.  IMPRESSION:  Further evaluation is suggested for possible mass in the left  breast.  RECOMMENDATION:  Ultrasound of the left breast. (Code:US-L-79M)  The patient will be contacted regarding the findings, and additional  imaging  will be scheduled.  BI-RADS CATEGORY 0: Incomplete. Need additional imaging evaluation  and/or prior mammograms for comparison.       Medications: I have reviewed the patient's current medications.  DISCUSSION: patient understands that follow up visits for continuing observation of the gyn cancer are felt medically necessary. The CA 125 today appears to be in her usual range, but will also be repeated prior to seeing Dr Josephina Shih in Sept and with next medical oncology visit in ~ 6 months.  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 Now on observation since good CT late Oct 2014. She will have CA 125 with Dr Tamela Oddi visit in Sept and I will see her with labs ~ 3 months after that.  2.HTN, elevated lipids, one episode of paroxysmal Afib. On pradaxa and ASA. HCTZ and losartan held since 04-09-13 and hydralazine now prn. She follows blood pressure regularly at home.  3.constipation improved with regular laxatives 4.hypothyroidism on replacement  5.osteoarthritis, post right hip  replacement 2013  6. PAC removed by IR  7.  All blood counts back into normal range today.    Patient understood discussion and is in agreement with plan above. Time spent 15 min including >50% counseling and coordination of care     Treyden Hakim P, MD   05/13/2014, 10:32 AM

## 2014-05-13 NOTE — Telephone Encounter (Signed)
per pof to sch appts-per pt req to have labs 1 wk before appt-Louise sending addtl pof to give lab at that time-gave pt copy of sch

## 2014-05-14 ENCOUNTER — Ambulatory Visit: Payer: Medicare Other | Admitting: Oncology

## 2014-05-14 ENCOUNTER — Other Ambulatory Visit: Payer: Medicare Other

## 2014-06-20 ENCOUNTER — Other Ambulatory Visit: Payer: Self-pay

## 2014-06-20 MED ORDER — PROPAFENONE HCL 225 MG PO TABS: 225.0000 mg | ORAL_TABLET | Freq: Two times a day (BID) | ORAL | Status: DC

## 2014-06-20 NOTE — Telephone Encounter (Signed)
Rx was sent to pharmacy electronically. 

## 2014-06-25 ENCOUNTER — Other Ambulatory Visit: Payer: Self-pay | Admitting: *Deleted

## 2014-06-25 MED ORDER — CLOPIDOGREL BISULFATE 75 MG PO TABS: 75.0000 mg | ORAL_TABLET | Freq: Every day | ORAL | Status: DC

## 2014-08-30 ENCOUNTER — Ambulatory Visit (HOSPITAL_BASED_OUTPATIENT_CLINIC_OR_DEPARTMENT_OTHER): Payer: Medicare Other

## 2014-08-30 DIAGNOSIS — C569 Malignant neoplasm of unspecified ovary: Secondary | ICD-10-CM

## 2014-08-30 LAB — CBC WITH DIFFERENTIAL/PLATELET
BASO%: 0.2 % (ref 0.0–2.0)
Basophils Absolute: 0 10*3/uL (ref 0.0–0.1)
EOS%: 0.4 % (ref 0.0–7.0)
Eosinophils Absolute: 0 10*3/uL (ref 0.0–0.5)
HCT: 40.1 % (ref 34.8–46.6)
HGB: 13.6 g/dL (ref 11.6–15.9)
LYMPH#: 1.7 10*3/uL (ref 0.9–3.3)
LYMPH%: 37.9 % (ref 14.0–49.7)
MCH: 33.7 pg (ref 25.1–34.0)
MCHC: 33.9 g/dL (ref 31.5–36.0)
MCV: 99.5 fL (ref 79.5–101.0)
MONO#: 0.4 10*3/uL (ref 0.1–0.9)
MONO%: 9.4 % (ref 0.0–14.0)
NEUT#: 2.3 10*3/uL (ref 1.5–6.5)
NEUT%: 52.1 % (ref 38.4–76.8)
Platelets: 133 10*3/uL — ABNORMAL LOW (ref 145–400)
RBC: 4.03 10*6/uL (ref 3.70–5.45)
RDW: 12.4 % (ref 11.2–14.5)
WBC: 4.5 10*3/uL (ref 3.9–10.3)

## 2014-08-31 LAB — CA 125(PREVIOUS METHOD): CA 125: 12.7 U/mL (ref 0.0–30.2)

## 2014-08-31 LAB — CA 125: CA 125: 18 U/mL (ref <35)

## 2014-09-06 ENCOUNTER — Ambulatory Visit: Payer: Medicare Other | Attending: Gynecology | Admitting: Gynecology

## 2014-09-06 ENCOUNTER — Encounter: Payer: Self-pay | Admitting: Gynecology

## 2014-09-06 ENCOUNTER — Other Ambulatory Visit: Payer: Medicare Other

## 2014-09-06 VITALS — BP 153/75 | Temp 97.8°F | Resp 18 | Ht 63.0 in | Wt 133.9 lb

## 2014-09-06 DIAGNOSIS — E785 Hyperlipidemia, unspecified: Secondary | ICD-10-CM | POA: Insufficient documentation

## 2014-09-06 DIAGNOSIS — I4891 Unspecified atrial fibrillation: Secondary | ICD-10-CM | POA: Diagnosis not present

## 2014-09-06 DIAGNOSIS — E039 Hypothyroidism, unspecified: Secondary | ICD-10-CM | POA: Insufficient documentation

## 2014-09-06 DIAGNOSIS — Z90710 Acquired absence of both cervix and uterus: Secondary | ICD-10-CM | POA: Diagnosis not present

## 2014-09-06 DIAGNOSIS — Z90722 Acquired absence of ovaries, bilateral: Secondary | ICD-10-CM | POA: Diagnosis not present

## 2014-09-06 DIAGNOSIS — I10 Essential (primary) hypertension: Secondary | ICD-10-CM | POA: Insufficient documentation

## 2014-09-06 DIAGNOSIS — Z7982 Long term (current) use of aspirin: Secondary | ICD-10-CM | POA: Diagnosis not present

## 2014-09-06 DIAGNOSIS — Z79899 Other long term (current) drug therapy: Secondary | ICD-10-CM | POA: Diagnosis not present

## 2014-09-06 DIAGNOSIS — Z08 Encounter for follow-up examination after completed treatment for malignant neoplasm: Secondary | ICD-10-CM | POA: Insufficient documentation

## 2014-09-06 DIAGNOSIS — Z8543 Personal history of malignant neoplasm of ovary: Secondary | ICD-10-CM | POA: Diagnosis not present

## 2014-09-06 DIAGNOSIS — Z7902 Long term (current) use of antithrombotics/antiplatelets: Secondary | ICD-10-CM | POA: Insufficient documentation

## 2014-09-06 DIAGNOSIS — C569 Malignant neoplasm of unspecified ovary: Secondary | ICD-10-CM

## 2014-09-06 NOTE — Progress Notes (Signed)
Consult Note: Gyn-Onc   [name] 73 y.o. female  Chief Complaint  Patient presents with  . Ovarian Cancer    Assessment : Stage IIb poorly differentiated ovarian cancer. Clinically free of disease.  Plan:    Patient return to see Dr. Marko Plume. in 3 months and see me in 6 months. The patient was reassured regarding her CA 125 value. In fact I pointed out that at the completion of her chemotherapy CA 125 is 18 units per mL as well... The patient is given instructions and warning signs regarding recurrent ovarian cancer although her prognosis is very good.  Interval History:   The patient returns today as previously scheduled. Overall she's been doing well although she has some days of being fatigued and others which she feels quite well.. The patient has no abdominal symptoms. She specifically has no GI or GU symptoms except for chronic constipation.. Functional status is good. She does not seem to have any peripheral neuropathy. CA 125 last week was 18 units per mL. This is slightly elevated over prior values of 12, 11. However, at the completion of her chemotherapy the CA 125 is 18 also.  HPI:The patient initially presented with a pelvic mass and elevated CA 125 (178 units per mL) She underwent exploratory laparotomy and debulking on 02/13/2013. Final pathology showed a poorly differentiated ovarian cancer involving both ovaries pelvic peritoneum and rectal muscularis. All gross disease was resected.  She then received 6 cycles of carboplatin and Taxol chemotherapy completed in September 2014. At the completion of chemotherapy her CA 125 was 18 units per mL.   Review of Systems:10 point review of systems is negative except as noted in interval history.   Vitals: Blood pressure 153/75, temperature 97.8 F (36.6 C), temperature source Oral, resp. rate 18, height 5\' 3"  (1.6 m), weight 133 lb 14.4 oz (60.737 kg).  Physical Exam: General : The patient is a healthy woman in no acute  distress.  HEENT: normocephalic, extraoccular movements normal; neck is supple without thyromegally  Lynphnodes: Supraclavicular and inguinal nodes not enlarged  Abdomen: Soft, non-tender, no ascites, no organomegally, no masses, no hernias , midline incision is healing well  Pelvic:    EGBUS: Normal female  Vagina bladder urethra: Normal  Vaginal cuff is well healed.  Cervix and uterus are surgically absent  Bimanual exam: No masses nodularity or fullness.  Rectovaginal exam confirms      Lower extremities: No edema or varicosities. Normal range of motion      Allergies  Allergen Reactions  . Codeine Other (See Comments)    Does not like the feeling she gets  . Lisinopril     LIP NUMBNESS    Past Medical History  Diagnosis Date  . Hypertension   . Dyslipidemia   . Hypothyroidism   . Arthritis     OSTEOARTHRITIS   -- CONSTANT PAIN RIGHT HIP---AND PAIN LEFT KNEE--PT STATES SHE GETS INJECTIONS INTO HER KNEE  . Complication of anesthesia     BLOOD PRESSURE DROPPED WITH NASAL SURGERY, ONE OF THE CARPAL TUNNEL REPAIRS AND DURING A COLONOSCOPY  . Ovarian cancer   . PAF (paroxysmal atrial fibrillation)     Only on Plavix -- not full Anticoagulation per pt. request    Past Surgical History  Procedure Laterality Date  . Bilateral carpal tunnel repair  2007  . Dilation and curettage of uterus  1969  . Surgery for ruptured ovarian cyst  1969  . Rhinoplasty for fractured nose  1986  .  Left knee arthroscopy   2011  . Total hip arthroplasty  02/25/2012    Procedure: TOTAL HIP ARTHROPLASTY ANTERIOR APPROACH;  Surgeon: Mcarthur Rossetti, MD;  Location: [facility];  Service: Orthopedics;  Laterality: Right;  . Tonsillectomy  1962  . Laparotomy Bilateral 02/13/2013    Procedure: EXPLORATORY LAPAROTOMY TOTAL ABDOMINAL HYSTERECTOMY BILATERAL SALPINGO-OOPHORECTOMY, Partial Rectal Resection with Reanastamosis;  Surgeon: Alvino Chapel, MD;  Location: [facility];  Service:  Gynecology;  Laterality: Bilateral;  . Omentectomy  02/13/2013    Procedure: OMENTECTOMY;  Surgeon: Alvino Chapel, MD;  Location: [facility];  Service: Gynecology;;  . Lymphadenectomy Right 02/13/2013    Procedure: PEVLIC  LYMPHADENECTOMY, DEBULKING right pelvic tumor nodules;  Surgeon: Alvino Chapel, MD;  Location: [facility];  Service: Gynecology;  Laterality: Right;  . Total abdominal hysterectomy  01/2013  . Transthoracic echocardiogram  May 2014    Normal LV size function. EF 60-65%. Grade 1 diastolic function. Mild MR and mildly elevated PA pressures of 37 mmHg per  . Nm myoview ltd  June 2010     subbmaximal with no ischemia or infarction.    Current Outpatient Prescriptions  Medication Sig Dispense Refill  . aspirin EC 81 MG tablet Take 81 mg by mouth daily.      . Calcium & Magnesium Carbonates (MYLANTA PO) Take by mouth. Prn      . clopidogrel (PLAVIX) 75 MG tablet Take 1 tablet (75 mg total) by mouth at bedtime.  90 tablet  2  . docusate sodium (COLACE) 100 MG capsule Take 100 mg by mouth 2 (two) times daily.      Marland Kitchen estradiol (ESTRACE) 1 MG tablet Take 1 tablet (1 mg total) by mouth every morning. PT STATES SHE WANTS ESTRACE--NOT THE GENERIC  90 tablet  3  . Glucosamine HCl (GLUCOSAMINE PO) Take 1,000 mg by mouth daily.      Marland Kitchen levothyroxine (SYNTHROID, LEVOTHROID) 88 MCG tablet Take 88 mcg by mouth every morning. PT STATES SHE NEEDS THE SYNTHROID--DOES NOT WANT THE GENERIC      . metoprolol tartrate (LOPRESSOR) 25 MG tablet Take 25 mg by mouth 2 (two) times daily.      . Multiple Vitamin (MULTIVITAMIN WITH MINERALS) TABS tablet Take 1 tablet by mouth daily.      Vladimir Faster Glycol-Propyl Glycol (SYSTANE OP) Apply 1 drop to eye 2 (two) times daily.      . polyethylene glycol (MIRALAX / GLYCOLAX) packet Take 17 g by mouth daily.      . potassium chloride (K-DUR) 10 MEQ tablet Take 10 mEq by mouth every Monday, Wednesday, and Friday.      . propafenone (RYTHMOL) 225 MG  tablet Take 1 tablet (225 mg total) by mouth 2 (two) times daily. 9am9pm  180 tablet  1  . Wheat Dextrin (BENEFIBER) POWD Take 2 scoop by mouth daily.       No current facility-administered medications for this visit.    History   Social History  . Marital Status: Married    Spouse Name: N/A    Number of Children: N/A  . Years of Education: N/A   Occupational History  . Not on file.   Social History Main Topics  . Smoking status: Never Smoker   . Smokeless tobacco: Never Used  . Alcohol Use: No  . Drug Use: No  . Sexual Activity: Not Currently   Other Topics Concern  . Not on file   Social History Narrative   Married mother  of 3, grandmother 5.   Exercises 6/7 days a week walking 30 minutes a time.   Never smoked or drank alcohol.    Family History  Problem Relation Age of Onset  . Hypertension Mother       Alvino Chapel, MD 09/06/2014, 9:17 AM

## 2014-09-06 NOTE — Patient Instructions (Signed)
Plan to see Dr. Marko Plume in three months and Dr. Fermin Schwab in 6 months.  Please call for any questions or concerns.

## 2014-09-19 ENCOUNTER — Other Ambulatory Visit: Payer: Self-pay

## 2014-09-19 DIAGNOSIS — C569 Malignant neoplasm of unspecified ovary: Secondary | ICD-10-CM

## 2014-09-19 NOTE — Progress Notes (Signed)
Pt. Requested the labs for Dr. Marko Plume on 11-11-14 be r/s ro the week prior to appointment on 14-14-15 so the results will be available at visit with Dr. Marko Plume.

## 2014-09-20 ENCOUNTER — Telehealth: Payer: Self-pay | Admitting: Oncology

## 2014-09-30 ENCOUNTER — Encounter: Payer: Self-pay | Admitting: Gynecology

## 2014-10-09 ENCOUNTER — Other Ambulatory Visit: Payer: Self-pay | Admitting: Cardiology

## 2014-10-09 NOTE — Telephone Encounter (Signed)
Rx was sent to pharmacy electronically. 

## 2014-10-29 ENCOUNTER — Ambulatory Visit: Payer: Medicare Other | Admitting: Podiatry

## 2014-10-31 ENCOUNTER — Ambulatory Visit: Payer: Medicare Other | Admitting: Podiatry

## 2014-10-31 ENCOUNTER — Ambulatory Visit (INDEPENDENT_AMBULATORY_CARE_PROVIDER_SITE_OTHER): Payer: Medicare Other | Admitting: Podiatry

## 2014-10-31 ENCOUNTER — Ambulatory Visit (INDEPENDENT_AMBULATORY_CARE_PROVIDER_SITE_OTHER): Payer: Medicare Other

## 2014-10-31 ENCOUNTER — Encounter: Payer: Self-pay | Admitting: Podiatry

## 2014-10-31 VITALS — BP 153/83 | HR 60 | Resp 17

## 2014-10-31 DIAGNOSIS — M79671 Pain in right foot: Secondary | ICD-10-CM

## 2014-10-31 DIAGNOSIS — M2011 Hallux valgus (acquired), right foot: Secondary | ICD-10-CM

## 2014-10-31 NOTE — Progress Notes (Signed)
   Subjective:    Patient ID: [name], female    DOB: 1941/03/11, 73 y.o.   MRN: [id number]  HPI  Pt presents with right foot pain. She has a noted bunion deformity and knot on the outer area of her right foot that causes her pain/discomfort when wearing closed toed shoes. States that the knot has been present for 2 months now.   Review of Systems  Musculoskeletal: Positive for back pain, arthralgias and gait problem.  All other systems reviewed and are negative.      Objective:   Physical Exam I have reviewed her past medical history medications allergy surgery social history and review of systems. Pulses are strongly palpable bilateral. Neurologic sensorium is intact with Semmes-Weinstein monofilament. Deep tendon reflexes are intact muscle strength +5 over 5 dorsiflexion plantar flexors and inverters and everters all of his musculature is intact. 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 On palpation one was able to feel spurs to the dorsal aspect of the midfoot and reviewed. Radiographic evaluation does demonstrate severe osteopenia. It also demonstrates severe metatarsus adductus more than likely associated with midfoot osteoarthritis and dorsal spurring which is noted. She also demonstrates severe hallux valgus deformity and a very prominent fifth metatarsal base associated with medial deviation of her metatarsals. Cutaneous evaluation demonstrates supple well-hydrated cutis no erythema edema saline as drainage or odor.        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  Plan: I discussed with her today great detail the pros and cons of shoe therapy and surgical therapy. I do not believe that she would be a good surgical candidate based on the osteopenia and the severity of her  deformities. I explained this to her however we will let her be evaluated by Dr. Jacqualyn Posey just for another opinion. I will follow up with her as needed.

## 2014-11-04 ENCOUNTER — Other Ambulatory Visit (HOSPITAL_BASED_OUTPATIENT_CLINIC_OR_DEPARTMENT_OTHER): Payer: Medicare Other

## 2014-11-04 DIAGNOSIS — C569 Malignant neoplasm of unspecified ovary: Secondary | ICD-10-CM

## 2014-11-04 LAB — CBC WITH DIFFERENTIAL/PLATELET
BASO%: 0.4 % (ref 0.0–2.0)
Basophils Absolute: 0 10*3/uL (ref 0.0–0.1)
EOS%: 0.6 % (ref 0.0–7.0)
Eosinophils Absolute: 0 10*3/uL (ref 0.0–0.5)
HEMATOCRIT: 39.4 % (ref 34.8–46.6)
HGB: 13.4 g/dL (ref 11.6–15.9)
LYMPH%: 35.3 % (ref 14.0–49.7)
MCH: 33.9 pg (ref 25.1–34.0)
MCHC: 34 g/dL (ref 31.5–36.0)
MCV: 99.7 fL (ref 79.5–101.0)
MONO#: 0.5 10*3/uL (ref 0.1–0.9)
MONO%: 10.1 % (ref 0.0–14.0)
NEUT#: 2.6 10*3/uL (ref 1.5–6.5)
NEUT%: 53.6 % (ref 38.4–76.8)
Platelets: 144 10*3/uL — ABNORMAL LOW (ref 145–400)
RBC: 3.95 10*6/uL (ref 3.70–5.45)
RDW: 12.5 % (ref 11.2–14.5)
WBC: 4.9 10*3/uL (ref 3.9–10.3)
lymph#: 1.7 10*3/uL (ref 0.9–3.3)

## 2014-11-04 LAB — COMPREHENSIVE METABOLIC PANEL (CC13)
ALBUMIN: 3.9 g/dL (ref 3.5–5.0)
ALT: 17 U/L (ref 0–55)
ANION GAP: 8 meq/L (ref 3–11)
AST: 19 U/L (ref 5–34)
Alkaline Phosphatase: 88 U/L (ref 40–150)
BUN: 10.8 mg/dL (ref 7.0–26.0)
CALCIUM: 9.3 mg/dL (ref 8.4–10.4)
CHLORIDE: 102 meq/L (ref 98–109)
CO2: 30 meq/L — AB (ref 22–29)
CREATININE: 0.7 mg/dL (ref 0.6–1.1)
EGFR: 89 mL/min/{1.73_m2} — ABNORMAL LOW (ref >90)
Glucose: 80 mg/dl (ref 70–140)
POTASSIUM: 4.2 meq/L (ref 3.5–5.1)
SODIUM: 140 meq/L (ref 136–145)
TOTAL PROTEIN: 6.5 g/dL (ref 6.4–8.3)
Total Bilirubin: 0.48 mg/dL (ref 0.20–1.20)

## 2014-11-05 LAB — CA 125: CA 125: 20 U/mL (ref <35)

## 2014-11-05 LAB — CA 125(PREVIOUS METHOD): CA 125: 13.4 U/mL (ref 0.0–30.2)

## 2014-11-08 ENCOUNTER — Encounter: Payer: Self-pay | Admitting: Podiatry

## 2014-11-08 ENCOUNTER — Ambulatory Visit (INDEPENDENT_AMBULATORY_CARE_PROVIDER_SITE_OTHER): Payer: Medicare Other | Admitting: Podiatry

## 2014-11-08 VITALS — BP 110/69 | HR 62 | Resp 12

## 2014-11-08 DIAGNOSIS — M2011 Hallux valgus (acquired), right foot: Secondary | ICD-10-CM

## 2014-11-08 DIAGNOSIS — M79671 Pain in right foot: Secondary | ICD-10-CM

## 2014-11-10 ENCOUNTER — Other Ambulatory Visit: Payer: Self-pay | Admitting: Oncology

## 2014-11-11 ENCOUNTER — Other Ambulatory Visit: Payer: Medicare Other

## 2014-11-11 ENCOUNTER — Telehealth: Payer: Self-pay | Admitting: Oncology

## 2014-11-11 ENCOUNTER — Encounter: Payer: Self-pay | Admitting: Oncology

## 2014-11-11 ENCOUNTER — Ambulatory Visit (HOSPITAL_BASED_OUTPATIENT_CLINIC_OR_DEPARTMENT_OTHER): Payer: Medicare Other | Admitting: Oncology

## 2014-11-11 ENCOUNTER — Other Ambulatory Visit: Payer: Self-pay | Admitting: Oncology

## 2014-11-11 VITALS — BP 164/74 | HR 61 | Temp 97.5°F | Resp 18 | Ht 63.0 in | Wt 135.1 lb

## 2014-11-11 DIAGNOSIS — E039 Hypothyroidism, unspecified: Secondary | ICD-10-CM

## 2014-11-11 DIAGNOSIS — Z1231 Encounter for screening mammogram for malignant neoplasm of breast: Secondary | ICD-10-CM

## 2014-11-11 DIAGNOSIS — C562 Malignant neoplasm of left ovary: Secondary | ICD-10-CM

## 2014-11-11 DIAGNOSIS — C569 Malignant neoplasm of unspecified ovary: Secondary | ICD-10-CM

## 2014-11-11 DIAGNOSIS — D242 Benign neoplasm of left breast: Secondary | ICD-10-CM

## 2014-11-11 DIAGNOSIS — C561 Malignant neoplasm of right ovary: Secondary | ICD-10-CM

## 2014-11-11 NOTE — Telephone Encounter (Signed)
, °

## 2014-11-11 NOTE — Progress Notes (Signed)
OFFICE PROGRESS NOTE   11/11/2014   Physicians:D. 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 She saw Dr Josephina Shih last on 09-06-14 and will see him again in 02-2015. Last imaging was CT AP 08-2013. She sees Dr Virgina Jock every 6 months  Patient has felt the best in past 2 months that she has since completing chemotherapy, after fatigue which lasted most of the last year. She had unremarkable colonoscopy 11-2013; bowels are better with colace, miralax, benefiber and occasional MOM. She continues daily Estrace, which she has used for years and is not interested in trying to stop. She denies abdominal or pelvic pain.  She expects to have left knee replacement early next year.    PAC was removed  ONCOLOGIC HISTORY   Malignant neoplasm of ovary   03/11/2013 Initial Diagnosis Malignant neoplasm of ovary  She had ~ 2 weeks vaginal spotting in late Dec 2013, then presented to Dr Abbie Sons in late Jan 2014 after bright red spotting that AM, with uterus larger than had been apparent on exam in Oct 2013. Sonohystogram 01-05-13 showed uterus normal size and echotexture, endometrium 4.3 mm, left ovary normal and right adnexa with 1.1 x 8.4.x8.2 cm cystic and solid mass. Endometrial biopsy benign and CA 125 also on 01-05-13 was 178.8. She had CT AP 01-17-13 with 1.0 x 6.9 x 8.9 cm complex right ovarian mass, no ascites, small retroperitoneal nodes. She was seen by Dr Skeet Latch on 01-18-13 and taken to surgery by Dr Josephina Shih on 02-13-13, which was TAH/BSO/ omentectomy/ureterolysis/ resection of cul-de-sac tumor/right pelvic lymphadnectomy and resection of rectum with reanastomosis. At completion of surgery there was no gross residual disease. 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 Washings [id number]) had rare clusters of atypical cells. Chemotherapy with dose dense taxol carboplatin was begun day 1 cycle 1 on 03-20-13; ANC was 1.1 on day 15 cycle 1 with taxol given and neupogen added 04-04-13. She had day 1 cycle 2 treatment on 04-17-13, then was briefly hospitalized 5-22 to 04-20-13 after syncopal episode, with antihypertensive agents DCd and UTI treated. She was feeling much better at time of "day 8" cycle 2 on 05-01-13 and did have neupogen 300 mcg x 1 dose on 05-02-13. She was readmitted to hospital 6-5 thru 05-05-13 with fever, empirically on antibiotics and blood cultures negative. She then went on planned beach vacation such that day 1 cycle 3 was administered on 05-22-13, with neupogen. Day 8 cycle 4 was delayed due to low ANC. Cycle 6 completed 08-14-13. CT AP 09-25-13 had no evidence of cancer and CA 125 was 18.3 on 07-24-2013.  Review of systems as above, also: She has a difficult time with breast self exam due to fibrocystic changes. She denies recent infectious illness or fever. No bleeding. No SOB. Left knee more bothersome than right, also right foot.  Remainder of 10 point Review of Systems negative.  Objective:  Vital signs in last 24 hours:  BP 164/74 mmHg  Pulse 61  Temp(Src) 97.5 F (36.4 C) (Oral)  Resp 18  Ht _0  (1.6 m)  Wt 135 lb 1.6 oz (61.281  kg)  BMI 23.94 kg/m2 Weight is up 2 lbs. Alert, oriented and appropriate. Ambulatory without assistance, some difficulty getting on and off exam table with knee problems.    HEENT:PERRL, sclerae not icteric. Oral mucosa moist without lesions, posterior pharynx clear.  Neck supple. No JVD.  Lymphatics:no cervical,supraclavicular, axillary or  inguinal adenopathy Resp: clear to auscultation bilaterally and normal percussion bilaterally Cardio: regular rate and rhythm. No gallop. GI: soft, nontender, not distended, no mass or organomegaly. Normally active bowel sounds. Surgical incision not remarkable. Musculoskeletal/ Extremities: without pitting edema, cords, tenderness. No effusion obvious at left knee. Back not tender Neuro: no peripheral neuropathy. Otherwise nonfocal. PSYCH appropriate mood and affect Skin without rash, ecchymosis, petechiae Breasts:bilaterally with irregularities consistent with fibrocystic change, and mobile, smoothly rounded 0.5 x 1.5 cm area left upper outer, otherwise bilaterally  without dominant mass, skin or nipple findings. Axillae benign.   Lab Results:  Results for orders placed or performed in visit on 11/04/14  CBC with Differential  Result Value Ref Range   WBC 4.9 3.9 - 10.3 10e3/uL   NEUT# 2.6 1.5 - 6.5 10e3/uL   HGB 13.4 11.6 - 15.9 g/dL   HCT 39.4 34.8 - 46.6 %   Platelets 144 (L) 145 - 400 10e3/uL   MCV 99.7 79.5 - 101.0 fL   MCH 33.9 25.1 - 34.0 pg   MCHC 34.0 31.5 - 36.0 g/dL   RBC 3.95 3.70 - 5.45 10e6/uL   RDW 12.5 11.2 - 14.5 %   lymph# 1.7 0.9 - 3.3 10e3/uL   MONO# 0.5 0.1 - 0.9 10e3/uL   Eosinophils Absolute 0.0 0.0 - 0.5 10e3/uL   Basophils Absolute 0.0 0.0 - 0.1 10e3/uL   NEUT% 53.6 38.4 - 76.8 %   LYMPH% 35.3 14.0 - 49.7 %   MONO% 10.1 0.0 - 14.0 %   EOS% 0.6 0.0 - 7.0 %   BASO% 0.4 0.0 - 2.0 %  Comprehensive metabolic panel (Cmet) - [facility]  Result Value Ref Range   Sodium 140 136 - 145 mEq/L   Potassium 4.2 3.5 - 5.1 mEq/L   Chloride 102 98 - 109 mEq/L   CO2 30 (H) 22 - 29 mEq/L   Glucose 80 70 - 140 mg/dl   BUN 10.8 7.0 - 26.0 mg/dL   Creatinine 0.7 0.6 - 1.1 mg/dL   Total Bilirubin 0.48 0.20 - 1.20 mg/dL   Alkaline Phosphatase 88 40 - 150 U/L   AST 19 5 - 34 U/L   ALT 17 0 - 55 U/L   Total Protein 6.5 6.4 - 8.3 g/dL   Albumin 3.9 3.5 - 5.0 g/dL   Calcium  9.3 8.4 - 10.4 mg/dL   Anion Gap 8 3 - 11 mEq/L   EGFR 89 (L) >90 ml/min/1.73 m2  CA 125  Result Value Ref Range   CA 125 20 <35 U/mL  CA 125 (Previous Method)  Result Value Ref Range   CA 125 13.4 0.0 - 30.2 U/mL     Studies/Results:  [facility]11[facility]14, left diagnostic at [facility] 03-19-14 with US biopsy  Medications: I have reviewed the patient's current medications.  DISCUSSION: Discussed change in lab methods for CA 125 and have given patient copies of those recent values as well as CBC and CMET as above, to take to Dr Keane Police visit upcoming.  Discussed concerns with long use of estrogen and suggested that, since she is feeling much better now, she might want to try to decrease/  DC this in next few months. Patient does not seem open to that recommendation.  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 Now on observation since good CT late Oct 2014. She will have CA 125 with Dr Tamela Oddi visit in April, and I will see her with labs 3-4 months after that. 2.HTN, elevated lipids, one episode of paroxysmal Afib. On pradaxa and ASA. HCTZ and losartan held since 04-09-13 and hydralazine now prn. She follows blood pressure regularly at home.  3.constipation improved with regular bowel medications, colonoscopy 11-2013 ok. 4.hypothyroidism on replacement  5.osteoarthritis, post right hip replacement 2013 and anticipating left knee replacement ~ Jan 2016 6. PAC removed by IR  7. All blood counts back into normal range 8. Long estrogen replacement, ongoing. 9. 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 Next bilateral mammograms due at [facility] 02-2015, MD confirmed with [facility] now and will schedule.  All questions answered and patient is in agreement with plans above. TIme spent 25 min including >50% counseling and coordination of care. Cc this note to Dr Esaw Dace, MD   11/11/2014, 10:56 AM

## 2014-11-11 NOTE — Progress Notes (Signed)
Patient ID: [name], female   DOB: January 13, 1941, 73 y.o.   MRN: [id number]  Subjective: 73 year old female returns the office they for follow-up evaluation of symptomatic bunion on her right foot. She states that she continues to have some throbbing sensation overlying the bunion particularly with shoe gear and pressure. She states of the area does not partially hurt, just throbs and is intermittent.  She has tried shoe gear modifications  although continues to have discomfort overlying the area. No acute changes since last appointment and no other complaints at this time. Denies any systemic complaints as fevers, chills, nausea, vomiting.   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 There is no significant erythema or inflammation overlying the medial prominence of the first metatarsal head on the right side. There is  hammertoe contractures present which are rigid. Mild spurring present on the dorsal aspect of the midfoot. No overlying edema, erythema, increased warmth. There is a prominent fifth metatarsal base bilaterally again but no overlying skin changes or edema, erythema, increased warmth.  There is no pain along the course of the peroneal tendons. Decrease in medial arch height upon weightbearing with equinus present.  MMT 5/5, ROM WNL No open lesions or pre-ulcerative lesions.  No pain with calf compression, swelling, warmth, erythema.   Assessment: 73 year old female with severe HAV deformity, met adductus, fifth metatarsal base pain with prominent metatarsal bases.  Plan: -Previous x-rays were reviewed.  -Treatment options were discussed including alternatives, risks, complications. -Due to significant deformity and bone quality would hold off on surgical  intervention at this time. -Patient may benefit from a custom molded orthotic to help support her foot type and to help alleviate some of the high-pressure areas. A prescription for custom molded orthotics were skin to the patient to go to biotech. -Discussed shoe gear modifications.  -Follow-up after the inserts are made. In the meantime, call the office with any questions, concerns, change in symptoms.

## 2014-11-28 ENCOUNTER — Telehealth: Payer: Self-pay | Admitting: Cardiology

## 2014-11-28 NOTE — Telephone Encounter (Signed)
Closed encounter °

## 2014-12-05 ENCOUNTER — Ambulatory Visit: Payer: Medicare Other | Admitting: Podiatry

## 2014-12-12 ENCOUNTER — Ambulatory Visit (INDEPENDENT_AMBULATORY_CARE_PROVIDER_SITE_OTHER): Payer: Medicare Other | Admitting: Cardiology

## 2014-12-12 ENCOUNTER — Encounter: Payer: Self-pay | Admitting: Cardiology

## 2014-12-12 ENCOUNTER — Other Ambulatory Visit: Payer: Self-pay | Admitting: Cardiology

## 2014-12-12 VITALS — BP 148/86 | HR 61 | Ht 63.0 in | Wt 137.2 lb

## 2014-12-12 DIAGNOSIS — E785 Hyperlipidemia, unspecified: Secondary | ICD-10-CM

## 2014-12-12 DIAGNOSIS — R55 Syncope and collapse: Secondary | ICD-10-CM

## 2014-12-12 DIAGNOSIS — I480 Paroxysmal atrial fibrillation: Secondary | ICD-10-CM

## 2014-12-12 DIAGNOSIS — I10 Essential (primary) hypertension: Secondary | ICD-10-CM

## 2014-12-12 DIAGNOSIS — Z01810 Encounter for preprocedural cardiovascular examination: Secondary | ICD-10-CM | POA: Insufficient documentation

## 2014-12-12 NOTE — Progress Notes (Signed)
PCP: Precious Reel, MD  Clinic Note: Chief Complaint  Patient presents with  . Follow-up    1 year. knee surgery sch for 01/03/15 needs clearence for Dr.Blackman    HPI: [name] is a 73 y.o. female with a PMH below who presents today for annual follow-up for PAF - well rhythm controlled with Rhythmol + Metoprolol. Has planned L Knee Sgx on Feb 5.  Past Medical History  Diagnosis Date  . Hypertension   . Dyslipidemia   . Hypothyroidism   . Arthritis     OSTEOARTHRITIS   -- CONSTANT PAIN RIGHT HIP---AND PAIN LEFT KNEE--PT STATES SHE GETS INJECTIONS INTO HER KNEE  . Complication of anesthesia     BLOOD PRESSURE DROPPED WITH NASAL SURGERY, ONE OF THE CARPAL TUNNEL REPAIRS AND DURING A COLONOSCOPY  . Ovarian cancer   . PAF (paroxysmal atrial fibrillation)     Only on Plavix -- not full Anticoagulation per pt. request    Prior Cardiac Evaluation and Past Surgical History: Procedure Laterality Date    Normal LV size function. EF 60-65%. Grade 1 diastolic function. Mild MR and mildly elevated PA pressures of 37 mmHg per  . Nm myoview ltd  June 2010     subbmaximal with no ischemia or infarction.   Screening Carotid dopplers in Fall 2015 - "mild / minor narrowing" (per pt report)  Interval History: She presents today with no real complaints.  Has not felt an episode of rapid irregular HR since 2010.  Very rarely feels "flutterin" sensation that last only seconds. She is very active, but limited by Knee pain.   No chest pain or shortness of breath with rest or exertion. No PND, orthopnea or edema. No palpitations, lightheadedness, dizziness, weakness or syncope/near syncope. No TIA/amaurosis fugax symptoms. No melena, hematochezia, hematuria, or epstaxis. No claudication.  ROS: A comprehensive was performed. ROS  Current Outpatient Prescriptions on File Prior to Visit  Medication Sig Dispense Refill  . aspirin EC 81 MG tablet Take 81 mg by mouth daily.    . Calcium &  Magnesium Carbonates (MYLANTA PO) Take by mouth. Prn    . clopidogrel (PLAVIX) 75 MG tablet Take 1 tablet (75 mg total) by mouth at bedtime. 90 tablet 2  . docusate sodium (COLACE) 100 MG capsule Take 100 mg by mouth 2 (two) times daily.    Marland Kitchen estradiol (ESTRACE) 1 MG tablet Take 1 tablet (1 mg total) by mouth every morning. PT STATES SHE WANTS ESTRACE--NOT THE GENERIC 90 tablet 3  . Glucosamine HCl (GLUCOSAMINE PO) Take 1,000 mg by mouth daily.    Marland Kitchen levothyroxine (SYNTHROID, LEVOTHROID) 88 MCG tablet Take 88 mcg by mouth every morning. PT STATES SHE NEEDS THE SYNTHROID--DOES NOT WANT THE GENERIC    . metoprolol tartrate (LOPRESSOR) 25 MG tablet Take 25 mg by mouth 2 (two) times daily.    . Multiple Vitamin (MULTIVITAMIN WITH MINERALS) TABS tablet Take 1 tablet by mouth daily.    Vladimir Faster Glycol-Propyl Glycol (SYSTANE OP) Apply 1 drop to eye 2 (two) times daily.    . polyethylene glycol (MIRALAX / GLYCOLAX) packet Take 17 g by mouth daily.    . potassium chloride (K-DUR) 10 MEQ tablet Take 10 mEq by mouth every Monday, Wednesday, and Friday.    . propafenone (RYTHMOL) 225 MG tablet Take 1 tablet (225 mg total) by mouth 2 (two) times daily. 9am9pm 180 tablet 1  . Wheat Dextrin (BENEFIBER) POWD Take 2 scoop by mouth daily.  No current facility-administered medications on file prior to visit.   Allergies  Allergen Reactions  . Codeine Other (See Comments)    Does not like the feeling she gets  . Lisinopril     LIP NUMBNESS  . Tape Rash    Current outpatient prescriptions:  .  aspirin EC 81 MG tablet, Take 81 mg by mouth daily., Disp: , Rfl:  .  Calcium & Magnesium Carbonates (MYLANTA PO), Take by mouth. Prn, Disp: , Rfl:  .  clopidogrel (PLAVIX) 75 MG tablet, Take 1 tablet (75 mg total) by mouth at bedtime., Disp: 90 tablet, Rfl: 2 .  docusate sodium (COLACE) 100 MG capsule, Take 100 mg by mouth 2 (two) times daily., Disp: , Rfl:  .  estradiol (ESTRACE) 1 MG tablet, Take 1 tablet (1  mg total) by mouth every morning. PT STATES SHE WANTS ESTRACE--NOT THE GENERIC, Disp: 90 tablet, Rfl: 3 .  Glucosamine HCl (GLUCOSAMINE PO), Take 1,000 mg by mouth daily., Disp: , Rfl:  .  levothyroxine (SYNTHROID, LEVOTHROID) 88 MCG tablet, Take 88 mcg by mouth every morning. PT STATES SHE NEEDS THE SYNTHROID--DOES NOT WANT THE GENERIC, Disp: , Rfl:  .  metoprolol tartrate (LOPRESSOR) 25 MG tablet, Take 25 mg by mouth 2 (two) times daily., Disp: , Rfl:  .  Multiple Vitamin (MULTIVITAMIN WITH MINERALS) TABS tablet, Take 1 tablet by mouth daily., Disp: , Rfl:  .  Polyethyl Glycol-Propyl Glycol (SYSTANE OP), Apply 1 drop to eye 2 (two) times daily., Disp: , Rfl:  .  polyethylene glycol (MIRALAX / GLYCOLAX) packet, Take 17 g by mouth daily., Disp: , Rfl:  .  potassium chloride (K-DUR) 10 MEQ tablet, Take 10 mEq by mouth every Monday, Wednesday, and Friday., Disp: , Rfl:  .  propafenone (RYTHMOL) 225 MG tablet, Take 1 tablet (225 mg total) by mouth 2 (two) times daily. 9am9pm, Disp: 180 tablet, Rfl: 1 .  Wheat Dextrin (BENEFIBER) POWD, Take 2 scoop by mouth daily., Disp: , Rfl:   SOCIAL AND FAMILY HISTORY REVIEWED IN EPIC -- No change  Wt Readings from Last 3 Encounters:  12/12/14 137 lb 3.2 oz (62.234 kg)  11/11/14 135 lb 1.6 oz (61.281 kg)  09/06/14 133 lb 14.4 oz (60.737 kg)    PHYSICAL EXAM BP 148/86 mmHg  Pulse 61  Ht 5\' 3"  (1.6 m)  Wt 137 lb 3.2 oz (62.234 kg)  BMI 24.31 kg/m2 General appearance: alert, cooperative, appears stated age and no distress Neck: no adenopathy, no carotid bruit, no JVD and supple, symmetrical, trachea midline Lungs: clear to auscultation bilaterally, normal percussion bilaterally and Nonlabored, good air movement Heart: regular rate and rhythm, S1, S2 normal, no murmur, click, rub or gallop and normal apical impulse Abdomen: soft, non-tender; bowel sounds normal; no masses, no organomegaly Extremities: extremities normal, atraumatic, no cyanosis or  edema Pulses: 2+ and symmetric Neurologic: Alert and oriented X 3, normal strength and tone. Normal symmetric reflexes. Normal coordination and gait    Adult ECG Report  Rate: 61 ;  Rhythm: normal sinus rhythm and normal axis (30), intervals, durations & voltage  Narrative Interpretation: Normal EKG  Recent Labs:  12/03/14   TC 196, TG 80, HDL 49, LDL 131 (just above goal)  ASSESSMENT / PLAN: Preop cardiovascular exam Pre-op for Low Risk Knee Surgery. No active Cardiac Symptoms -- no Again of CHF, no recurrent Afib  No requirement for pre-op Cardiac studies.   Continue BB & Rhythmol peri-op OK to stop Plavix 5-7 days pre-op.  Continue ASA if OK per Surgeon.   PAF (paroxysmal atrial fibrillation) CHA2DS2VASC - 3, but nor recurrence of Afib. We discussed AC options - for now " splitting the difference" with ASA/Plavix is reasonable, but as time goes by, will need to consider converting to full Anticoagulation [facility]).   Essential hypertension Borderline control today - on BB.  Has been stable running 120a-140s @ home . No change.   Syncope, history of No recurrent episodes.   Dyslipidemia Not on Rx - monitored by PCP.  Diet & Exercise.  Goal would be LDL 100-130 --> not quite there.    Pre-op Letter provided.    Orders Placed This Encounter  Procedures  . EKG 12-Lead   No orders of the defined types were placed in this encounter.     Followup: 1 yr   [name], M.D., M.S. Interventional Cardiologist   Pager # [phone]

## 2014-12-12 NOTE — Assessment & Plan Note (Signed)
Borderline control today - on BB.  Has been stable running 120a-140s @ home . No change.

## 2014-12-12 NOTE — Telephone Encounter (Signed)
Rx(s) sent to pharmacy electronically.  

## 2014-12-12 NOTE — Assessment & Plan Note (Signed)
Pre-op for Low Risk Knee Surgery. No active Cardiac Symptoms -- no Again of CHF, no recurrent Afib  No requirement for pre-op Cardiac studies.   Continue BB & Rhythmol peri-op OK to stop Plavix 5-7 days pre-op.  Continue ASA if OK per Surgeon.

## 2014-12-12 NOTE — Patient Instructions (Signed)
Your physician wants you to follow-up in: 1 Year You will receive a reminder letter in the mail two months in advance. If you don't receive a letter, please call our office to schedule the follow-up appointment.  You are cleared for Surgery, ok to stop Plavix 5 days before

## 2014-12-12 NOTE — Assessment & Plan Note (Signed)
No recurrent episodes. 

## 2014-12-12 NOTE — Assessment & Plan Note (Signed)
Not on Rx - monitored by PCP.  Diet & Exercise.  Goal would be LDL 100-130 --> not quite there.

## 2014-12-12 NOTE — Assessment & Plan Note (Signed)
CHA2DS2VASC - 3, but nor recurrence of Afib. We discussed AC options - for now " splitting the difference" with ASA/Plavix is reasonable, but as time goes by, will need to consider converting to full Anticoagulation [facility]).

## 2014-12-17 ENCOUNTER — Encounter: Payer: Self-pay | Admitting: Cardiology

## 2014-12-23 ENCOUNTER — Other Ambulatory Visit (HOSPITAL_COMMUNITY): Payer: Self-pay | Admitting: Orthopaedic Surgery

## 2014-12-23 NOTE — Progress Notes (Signed)
Called Dr. Trevor Mace office requested orders be put in [place] surgery 01-03-15 pre op 12-30-14 Thanks

## 2014-12-27 NOTE — Patient Instructions (Addendum)
KENIDI ELENBAAS  12/27/2014   Your procedure is scheduled on: 01/03/15   Report to [facility] Main  Entrance and follow signs to               [place] at 5:30 AM.   Call this number if you have problems the morning of surgery [phone]   Remember:  Do not eat food or drink liquids :After Midnight.     Take these medicines the morning of surgery with A SIP OF WATER: ESTRADIOL / SYNTHROID / METOPROLOL / PROPAFENORE                               You may not have any metal on your body including hair pins and              piercings  Do not wear jewelry, make-up, lotions, powders or perfumes.             Do not wear nail polish.  Do not shave  48 hours prior to surgery.              Men may shave face and neck.   Do not bring valuables to the hospital. [place].  Contacts, dentures or bridgework may not be worn into surgery.  Leave suitcase in the car. After surgery it may be brought to your room.     Patients discharged the day of surgery will not be allowed to drive home.  Name and phone number of your driver:  Special Instructions: N/A              Please read over the following fact sheets you were given: _____________________________________________________________________                                                     [place]  Before surgery, you can play an important role.  Because skin is not sterile, your skin needs to be as free of germs as possible.  You can reduce the number of germs on your skin by washing with CHG (chlorahexidine gluconate) soap before surgery.  CHG is an antiseptic cleaner which kills germs and bonds with the skin to continue killing germs even after washing. Please DO NOT use if you have an allergy to CHG or antibacterial soaps.  If your skin becomes reddened/irritated stop using the CHG and inform your nurse when you arrive at  Short Stay. Do not shave (including legs and underarms) for at least 48 hours prior to the first CHG shower.  You may shave your face. Please follow these instructions carefully:   1.  Shower with CHG Soap the night before surgery and the  morning of Surgery.   2.  If you choose to wash your hair, wash your hair first as usual with your  normal  Shampoo.   3.  After you shampoo, rinse your hair and body thoroughly to remove the  shampoo.  4.  Use CHG as you would any other liquid soap.  You can apply chg directly  to the skin and wash . Gently wash with scrungie or clean wascloth    5.  Apply the CHG Soap to your body ONLY FROM THE NECK DOWN.   Do not use on open                           Wound or open sores. Avoid contact with eyes, ears mouth and genitals (private parts).                        Genitals (private parts) with your normal soap.              6.  Wash thoroughly, paying special attention to the area where your surgery  will be performed.   7.  Thoroughly rinse your body with warm water from the neck down.   8.  DO NOT shower/wash with your normal soap after using and rinsing off  the CHG Soap .                9.  Pat yourself dry with a clean towel.             10.  Wear clean pajamas.             11.  Place clean sheets on your bed the night of your first shower and do not  sleep with pets.  Day of Surgery : Do not apply any lotions/deodorants the morning of surgery.  Please wear clean clothes to the hospital/surgery center.  FAILURE TO FOLLOW THESE INSTRUCTIONS MAY RESULT IN THE CANCELLATION OF YOUR SURGERY    PATIENT SIGNATURE_________________________________  ______________________________________________________________________

## 2014-12-30 ENCOUNTER — Encounter (HOSPITAL_COMMUNITY): Payer: Self-pay

## 2014-12-30 ENCOUNTER — Ambulatory Visit (HOSPITAL_COMMUNITY)
Admission: RE | Admit: 2014-12-30 | Discharge: 2014-12-30 | Disposition: A | Discharge status: Home / Self Care | Payer: Medicare Other | Source: Ambulatory Visit | Attending: Anesthesiology | Admitting: Anesthesiology

## 2014-12-30 ENCOUNTER — Encounter (HOSPITAL_COMMUNITY)
Admission: RE | Admit: 2014-12-30 | Discharge: 2014-12-30 | Disposition: A | Discharge status: Home / Self Care | Payer: Medicare Other | Source: Ambulatory Visit | Attending: Orthopaedic Surgery | Admitting: Orthopaedic Surgery

## 2014-12-30 DIAGNOSIS — I10 Essential (primary) hypertension: Secondary | ICD-10-CM | POA: Diagnosis not present

## 2014-12-30 DIAGNOSIS — Z01818 Encounter for other preprocedural examination: Secondary | ICD-10-CM | POA: Diagnosis present

## 2014-12-30 HISTORY — DX: Personal history of other malignant neoplasm of skin: Z85.828

## 2014-12-30 LAB — CBC
HCT: 39.2 % (ref 36.0–46.0)
Hemoglobin: 13.2 g/dL (ref 12.0–15.0)
MCH: 33.9 pg (ref 26.0–34.0)
MCHC: 33.7 g/dL (ref 30.0–36.0)
MCV: 100.8 fL — ABNORMAL HIGH (ref 78.0–100.0)
Platelets: 139 10*3/uL — ABNORMAL LOW (ref 150–400)
RBC: 3.89 MIL/uL (ref 3.87–5.11)
RDW: 12.4 % (ref 11.5–15.5)
WBC: 4.7 10*3/uL (ref 4.0–10.5)

## 2014-12-30 LAB — BASIC METABOLIC PANEL
Anion gap: 5 (ref 5–15)
BUN: 10 mg/dL (ref 6–23)
CO2: 32 mmol/L (ref 19–32)
Calcium: 9.3 mg/dL (ref 8.4–10.5)
Chloride: 102 mmol/L (ref 96–112)
Creatinine, Ser: 0.47 mg/dL — ABNORMAL LOW (ref 0.50–1.10)
GFR calc Af Amer: >90 mL/min (ref >90)
GFR calc non Af Amer: >90 mL/min (ref >90)
Glucose, Bld: 80 mg/dL (ref 70–99)
Potassium: 3.9 mmol/L (ref 3.5–5.1)
Sodium: 139 mmol/L (ref 135–145)

## 2014-12-30 LAB — PROTIME-INR
INR: 0.99 (ref 0.00–1.49)
Prothrombin Time: 13.2 seconds (ref 11.6–15.2)

## 2014-12-30 LAB — APTT: aPTT: 38 seconds — ABNORMAL HIGH (ref 24–37)

## 2014-12-30 LAB — SURGICAL PCR SCREEN
MRSA, PCR: NEGATIVE
Staphylococcus aureus: NEGATIVE

## 2014-12-30 NOTE — Progress Notes (Signed)
BMP and PTT done 12/30/2014 routed via EPIC to Dr Zollie Beckers.

## 2015-01-01 NOTE — Anesthesia Preprocedure Evaluation (Addendum)
Anesthesia Evaluation  Patient identified by MRN, date of birth, ID band Patient awake    Reviewed: Allergy & Precautions, H&P , NPO status , Patient's Chart, lab work & pertinent test results, reviewed documented beta blocker date and time   Airway Mallampati: II  TM Distance: >3 FB Neck ROM: full    Dental  (+) Caps, Dental Advisory Given All front upper are capped:   Pulmonary neg pulmonary ROS,  breath sounds clear to auscultation  Pulmonary exam normal       Cardiovascular Exercise Tolerance: Good hypertension, Pt. on home beta blockers and Pt. on medications + dysrhythmias Atrial Fibrillation Rhythm:regular Rate:Normal  ECG - NSR   Neuro/Psych negative neurological ROS  negative psych ROS   GI/Hepatic negative GI ROS, Neg liver ROS,   Endo/Other  Hypothyroidism   Renal/GU negative Renal ROS  negative genitourinary   Musculoskeletal   Abdominal   Peds  Hematology negative hematology ROS (+)   Anesthesia Other Findings   Reproductive/Obstetrics negative OB ROS                          Anesthesia Physical Anesthesia Plan  ASA: III  Anesthesia Plan: Spinal   Post-op Pain Management:    Induction:   Airway Management Planned: Simple Face Mask  Additional Equipment:   Intra-op Plan:   Post-operative Plan:   Informed Consent:   Plan Discussed with: Surgeon  Anesthesia Plan Comments:        Anesthesia Quick Evaluation

## 2015-01-03 ENCOUNTER — Inpatient Hospital Stay (HOSPITAL_COMMUNITY): Payer: Medicare Other

## 2015-01-03 ENCOUNTER — Encounter (HOSPITAL_COMMUNITY)
Admission: RE | Disposition: A | Discharge status: Home Health | Payer: Self-pay | Source: Ambulatory Visit | Attending: Orthopaedic Surgery

## 2015-01-03 ENCOUNTER — Inpatient Hospital Stay (HOSPITAL_COMMUNITY)
Admission: RE | Admit: 2015-01-03 | Discharge: 2015-01-06 | DRG: 470 | Disposition: A | Discharge status: Home Health | Payer: Medicare Other | Source: Ambulatory Visit | Attending: Orthopaedic Surgery | Admitting: Orthopaedic Surgery

## 2015-01-03 ENCOUNTER — Encounter (HOSPITAL_COMMUNITY): Payer: Self-pay

## 2015-01-03 ENCOUNTER — Inpatient Hospital Stay (HOSPITAL_COMMUNITY): Payer: Medicare Other | Admitting: Anesthesiology

## 2015-01-03 DIAGNOSIS — I10 Essential (primary) hypertension: Secondary | ICD-10-CM | POA: Diagnosis present

## 2015-01-03 DIAGNOSIS — M659 Synovitis and tenosynovitis, unspecified: Secondary | ICD-10-CM | POA: Diagnosis present

## 2015-01-03 DIAGNOSIS — D62 Acute posthemorrhagic anemia: Secondary | ICD-10-CM | POA: Diagnosis not present

## 2015-01-03 DIAGNOSIS — Z8543 Personal history of malignant neoplasm of ovary: Secondary | ICD-10-CM | POA: Diagnosis not present

## 2015-01-03 DIAGNOSIS — Z96652 Presence of left artificial knee joint: Secondary | ICD-10-CM

## 2015-01-03 DIAGNOSIS — Z7982 Long term (current) use of aspirin: Secondary | ICD-10-CM | POA: Diagnosis not present

## 2015-01-03 DIAGNOSIS — I480 Paroxysmal atrial fibrillation: Secondary | ICD-10-CM | POA: Diagnosis present

## 2015-01-03 DIAGNOSIS — Z85828 Personal history of other malignant neoplasm of skin: Secondary | ICD-10-CM | POA: Diagnosis not present

## 2015-01-03 DIAGNOSIS — Z96641 Presence of right artificial hip joint: Secondary | ICD-10-CM | POA: Diagnosis present

## 2015-01-03 DIAGNOSIS — E785 Hyperlipidemia, unspecified: Secondary | ICD-10-CM | POA: Diagnosis present

## 2015-01-03 DIAGNOSIS — M1712 Unilateral primary osteoarthritis, left knee: Secondary | ICD-10-CM | POA: Diagnosis present

## 2015-01-03 DIAGNOSIS — M25562 Pain in left knee: Secondary | ICD-10-CM | POA: Diagnosis present

## 2015-01-03 DIAGNOSIS — Z79899 Other long term (current) drug therapy: Secondary | ICD-10-CM

## 2015-01-03 DIAGNOSIS — E039 Hypothyroidism, unspecified: Secondary | ICD-10-CM | POA: Diagnosis present

## 2015-01-03 HISTORY — PX: TOTAL KNEE ARTHROPLASTY: SHX125

## 2015-01-03 LAB — TYPE AND SCREEN
ABO/RH(D): A POS
Antibody Screen: NEGATIVE

## 2015-01-03 SURGERY — ARTHROPLASTY, KNEE, TOTAL
Anesthesia: Spinal | Site: Knee | Laterality: Left

## 2015-01-03 MED ORDER — PROPOFOL 10 MG/ML IV BOLUS
INTRAVENOUS | Status: AC
  Filled 2015-01-03: qty 20

## 2015-01-03 MED ORDER — ONDANSETRON HCL 4 MG PO TABS: 4.0000 mg | ORAL_TABLET | Freq: Four times a day (QID) | ORAL | Status: DC | PRN

## 2015-01-03 MED ORDER — METHOCARBAMOL 1000 MG/10ML IJ SOLN
500.0000 mg | Freq: Four times a day (QID) | INTRAVENOUS | Status: DC | PRN
  Administered 2015-01-03: 500 mg via INTRAVENOUS
  Filled 2015-01-03 (×2): qty 5

## 2015-01-03 MED ORDER — CEFAZOLIN SODIUM-DEXTROSE 2-3 GM-% IV SOLR
2.0000 g | INTRAVENOUS | Status: AC
  Administered 2015-01-03: 2 g via INTRAVENOUS

## 2015-01-03 MED ORDER — POLYETHYLENE GLYCOL 3350 17 G PO PACK
17.0000 g | PACK | Freq: Every day | ORAL | Status: DC
  Administered 2015-01-04 – 2015-01-06 (×3): 17 g via ORAL

## 2015-01-03 MED ORDER — ONDANSETRON HCL 4 MG/2ML IJ SOLN
INTRAMUSCULAR | Status: DC | PRN
  Administered 2015-01-03: 4 mg via INTRAVENOUS

## 2015-01-03 MED ORDER — ESTRADIOL 1 MG PO TABS
1.0000 mg | ORAL_TABLET | Freq: Every day | ORAL | Status: DC
  Administered 2015-01-04 – 2015-01-06 (×3): 1 mg via ORAL
  Filled 2015-01-03: qty 1

## 2015-01-03 MED ORDER — ASPIRIN EC 81 MG PO TBEC
81.0000 mg | DELAYED_RELEASE_TABLET | Freq: Every day | ORAL | Status: DC
  Administered 2015-01-03 – 2015-01-06 (×4): 81 mg via ORAL
  Filled 2015-01-03 (×4): qty 1

## 2015-01-03 MED ORDER — LACTATED RINGERS IV SOLN: INTRAVENOUS | Status: DC

## 2015-01-03 MED ORDER — MIDAZOLAM HCL 5 MG/5ML IJ SOLN
INTRAMUSCULAR | Status: DC | PRN
  Administered 2015-01-03 (×2): 1 mg via INTRAVENOUS

## 2015-01-03 MED ORDER — CEFAZOLIN SODIUM 1-5 GM-% IV SOLN
1.0000 g | Freq: Four times a day (QID) | INTRAVENOUS | Status: AC
  Administered 2015-01-03 (×2): 1 g via INTRAVENOUS
  Filled 2015-01-03 (×2): qty 50

## 2015-01-03 MED ORDER — ADULT MULTIVITAMIN W/MINERALS CH
1.0000 | ORAL_TABLET | Freq: Every day | ORAL | Status: DC
  Administered 2015-01-04 – 2015-01-06 (×3): 1 via ORAL
  Filled 2015-01-03 (×3): qty 1

## 2015-01-03 MED ORDER — PROPOFOL 10 MG/ML IV BOLUS
INTRAVENOUS | Status: DC | PRN
Start: 2015-01-03 — End: 2015-01-03
  Administered 2015-01-03: 20 mg via INTRAVENOUS

## 2015-01-03 MED ORDER — LEVOTHYROXINE SODIUM 88 MCG PO TABS
88.0000 ug | ORAL_TABLET | Freq: Every day | ORAL | Status: DC
  Administered 2015-01-04 – 2015-01-06 (×3): 88 ug via ORAL

## 2015-01-03 MED ORDER — HYDROMORPHONE HCL 1 MG/ML IJ SOLN
INTRAMUSCULAR | Status: AC
  Filled 2015-01-03: qty 1

## 2015-01-03 MED ORDER — DEXAMETHASONE SODIUM PHOSPHATE 10 MG/ML IJ SOLN
INTRAMUSCULAR | Status: DC | PRN
  Administered 2015-01-03: 10 mg via INTRAVENOUS

## 2015-01-03 MED ORDER — LACTATED RINGERS IV SOLN
INTRAVENOUS | Status: DC | PRN
  Administered 2015-01-03 (×2): via INTRAVENOUS

## 2015-01-03 MED ORDER — ZOLPIDEM TARTRATE 5 MG PO TABS: 5.0000 mg | ORAL_TABLET | Freq: Every evening | ORAL | Status: DC | PRN

## 2015-01-03 MED ORDER — ONDANSETRON HCL 4 MG/2ML IJ SOLN
4.0000 mg | Freq: Four times a day (QID) | INTRAMUSCULAR | Status: DC | PRN
  Administered 2015-01-03 – 2015-01-04 (×2): 4 mg via INTRAVENOUS
  Filled 2015-01-03 (×2): qty 2

## 2015-01-03 MED ORDER — OXYCODONE HCL 5 MG PO TABS
5.0000 mg | ORAL_TABLET | ORAL | Status: DC | PRN
  Administered 2015-01-04: 10 mg via ORAL
  Filled 2015-01-03 (×2): qty 2
  Filled 2015-01-03: qty 1

## 2015-01-03 MED ORDER — ACETAMINOPHEN 650 MG RE SUPP: 650.0000 mg | Freq: Four times a day (QID) | RECTAL | Status: DC | PRN

## 2015-01-03 MED ORDER — METOPROLOL TARTRATE 25 MG PO TABS
25.0000 mg | ORAL_TABLET | Freq: Two times a day (BID) | ORAL | Status: DC
  Administered 2015-01-03 – 2015-01-06 (×6): 25 mg via ORAL
  Filled 2015-01-03 (×7): qty 1

## 2015-01-03 MED ORDER — METHOCARBAMOL 500 MG PO TABS
500.0000 mg | ORAL_TABLET | Freq: Four times a day (QID) | ORAL | Status: DC | PRN
Start: 2015-01-03 — End: 2015-01-06
  Administered 2015-01-03 – 2015-01-06 (×7): 500 mg via ORAL
  Filled 2015-01-03 (×7): qty 1

## 2015-01-03 MED ORDER — METOCLOPRAMIDE HCL 10 MG PO TABS
5.0000 mg | ORAL_TABLET | Freq: Three times a day (TID) | ORAL | Status: DC | PRN
  Administered 2015-01-05: 10 mg via ORAL
  Filled 2015-01-03: qty 1

## 2015-01-03 MED ORDER — EPHEDRINE SULFATE 50 MG/ML IJ SOLN
INTRAMUSCULAR | Status: DC | PRN
  Administered 2015-01-03 (×2): 5 mg via INTRAVENOUS
  Administered 2015-01-03 (×2): 10 mg via INTRAVENOUS

## 2015-01-03 MED ORDER — FENTANYL CITRATE 0.05 MG/ML IJ SOLN
INTRAMUSCULAR | Status: DC | PRN
  Administered 2015-01-03 (×2): 50 ug via INTRAVENOUS

## 2015-01-03 MED ORDER — POTASSIUM CHLORIDE ER 10 MEQ PO TBCR
10.0000 meq | EXTENDED_RELEASE_TABLET | ORAL | Status: DC
  Administered 2015-01-03 – 2015-01-06 (×2): 10 meq via ORAL
  Filled 2015-01-03 (×2): qty 1

## 2015-01-03 MED ORDER — MIDAZOLAM HCL 2 MG/2ML IJ SOLN
INTRAMUSCULAR | Status: AC
  Filled 2015-01-03: qty 2

## 2015-01-03 MED ORDER — ACETAMINOPHEN 10 MG/ML IV SOLN
1000.0000 mg | Freq: Once | INTRAVENOUS | Status: AC
  Administered 2015-01-03: 1000 mg via INTRAVENOUS
  Filled 2015-01-03: qty 100

## 2015-01-03 MED ORDER — PROPAFENONE HCL 225 MG PO TABS
225.0000 mg | ORAL_TABLET | Freq: Two times a day (BID) | ORAL | Status: DC
  Administered 2015-01-03 – 2015-01-06 (×6): 225 mg via ORAL
  Filled 2015-01-03 (×8): qty 1

## 2015-01-03 MED ORDER — CLOPIDOGREL BISULFATE 75 MG PO TABS
75.0000 mg | ORAL_TABLET | Freq: Every day | ORAL | Status: DC
  Administered 2015-01-04 – 2015-01-05 (×2): 75 mg via ORAL
  Filled 2015-01-03 (×4): qty 1

## 2015-01-03 MED ORDER — MENTHOL 3 MG MT LOZG
1.0000 | LOZENGE | OROMUCOSAL | Status: DC | PRN
  Filled 2015-01-03: qty 9

## 2015-01-03 MED ORDER — ESTRADIOL 1 MG PO TABS: 1.0000 mg | ORAL_TABLET | Freq: Every day | ORAL | Status: DC

## 2015-01-03 MED ORDER — DIPHENHYDRAMINE HCL 12.5 MG/5ML PO ELIX: 12.5000 mg | ORAL_SOLUTION | ORAL | Status: DC | PRN

## 2015-01-03 MED ORDER — SODIUM CHLORIDE 0.9 % IJ SOLN
INTRAMUSCULAR | Status: AC
  Filled 2015-01-03: qty 50

## 2015-01-03 MED ORDER — CEFAZOLIN SODIUM-DEXTROSE 2-3 GM-% IV SOLR
INTRAVENOUS | Status: AC
  Filled 2015-01-03: qty 50

## 2015-01-03 MED ORDER — SODIUM CHLORIDE 0.9 % IJ SOLN
INTRAMUSCULAR | Status: AC
  Filled 2015-01-03: qty 10

## 2015-01-03 MED ORDER — BUPIVACAINE HCL (PF) 0.75 % IJ SOLN
INTRAMUSCULAR | Status: DC | PRN
  Administered 2015-01-03: 13 mg via INTRATHECAL

## 2015-01-03 MED ORDER — 0.9 % SODIUM CHLORIDE (POUR BTL) OPTIME
TOPICAL | Status: DC | PRN
  Administered 2015-01-03: 1000 mL

## 2015-01-03 MED ORDER — DOCUSATE SODIUM 100 MG PO CAPS
100.0000 mg | ORAL_CAPSULE | Freq: Two times a day (BID) | ORAL | Status: DC
  Administered 2015-01-03 – 2015-01-06 (×6): 100 mg via ORAL

## 2015-01-03 MED ORDER — CELECOXIB 200 MG PO CAPS
200.0000 mg | ORAL_CAPSULE | Freq: Two times a day (BID) | ORAL | Status: DC
  Filled 2015-01-03 (×3): qty 1

## 2015-01-03 MED ORDER — FENTANYL CITRATE 0.05 MG/ML IJ SOLN
INTRAMUSCULAR | Status: AC
  Filled 2015-01-03: qty 2

## 2015-01-03 MED ORDER — ALUM & MAG HYDROXIDE-SIMETH 200-200-20 MG/5ML PO SUSP: 30.0000 mL | ORAL | Status: DC | PRN

## 2015-01-03 MED ORDER — HYDROMORPHONE HCL 1 MG/ML IJ SOLN
0.2500 mg | INTRAMUSCULAR | Status: DC | PRN
  Administered 2015-01-03 (×4): 0.5 mg via INTRAVENOUS

## 2015-01-03 MED ORDER — SODIUM CHLORIDE 0.9 % IV SOLN
INTRAVENOUS | Status: DC
  Administered 2015-01-03 – 2015-01-04 (×2): via INTRAVENOUS

## 2015-01-03 MED ORDER — PROPOFOL INFUSION 10 MG/ML OPTIME
INTRAVENOUS | Status: DC | PRN
  Administered 2015-01-03: 140 ug/kg/min via INTRAVENOUS

## 2015-01-03 MED ORDER — SUFENTANIL CITRATE 50 MCG/ML IV SOLN
INTRAVENOUS | Status: AC
  Filled 2015-01-03: qty 1

## 2015-01-03 MED ORDER — TRAMADOL HCL 50 MG PO TABS
100.0000 mg | ORAL_TABLET | Freq: Four times a day (QID) | ORAL | Status: DC | PRN
  Administered 2015-01-03 – 2015-01-06 (×9): 100 mg via ORAL
  Filled 2015-01-03 (×10): qty 2

## 2015-01-03 MED ORDER — ACETAMINOPHEN 500 MG PO TABS
1000.0000 mg | ORAL_TABLET | Freq: Four times a day (QID) | ORAL | Status: DC | PRN
  Administered 2015-01-03 – 2015-01-06 (×3): 1000 mg via ORAL
  Filled 2015-01-03 (×3): qty 2

## 2015-01-03 MED ORDER — PHENOL 1.4 % MT LIQD
1.0000 | OROMUCOSAL | Status: DC | PRN
  Filled 2015-01-03: qty 177

## 2015-01-03 MED ORDER — SODIUM CHLORIDE 0.9 % IJ SOLN
INTRAMUSCULAR | Status: DC | PRN
  Administered 2015-01-03: 40 mL

## 2015-01-03 MED ORDER — EPHEDRINE SULFATE 50 MG/ML IJ SOLN
INTRAMUSCULAR | Status: AC
  Filled 2015-01-03: qty 1

## 2015-01-03 MED ORDER — LEVOTHYROXINE SODIUM 88 MCG PO TABS
88.0000 ug | ORAL_TABLET | Freq: Every day | ORAL | Status: DC
  Filled 2015-01-03: qty 1

## 2015-01-03 MED ORDER — SODIUM CHLORIDE 0.9 % IR SOLN
Status: DC | PRN
  Administered 2015-01-03: 1000 mL

## 2015-01-03 MED ORDER — DEXAMETHASONE SODIUM PHOSPHATE 10 MG/ML IJ SOLN
INTRAMUSCULAR | Status: AC
  Filled 2015-01-03: qty 1

## 2015-01-03 MED ORDER — METOCLOPRAMIDE HCL 5 MG/ML IJ SOLN
5.0000 mg | Freq: Three times a day (TID) | INTRAMUSCULAR | Status: DC | PRN
  Administered 2015-01-04: 10 mg via INTRAVENOUS
  Filled 2015-01-03 (×2): qty 2

## 2015-01-03 MED ORDER — ACETAMINOPHEN 325 MG PO TABS: 650.0000 mg | ORAL_TABLET | Freq: Four times a day (QID) | ORAL | Status: DC | PRN

## 2015-01-03 MED ORDER — BUPIVACAINE LIPOSOME 1.3 % IJ SUSP
20.0000 mL | Freq: Once | INTRAMUSCULAR | Status: AC
  Administered 2015-01-03: 20 mL
  Filled 2015-01-03: qty 20

## 2015-01-03 SURGICAL SUPPLY — 58 items
BAG ZIPLOCK 12X15 (MISCELLANEOUS) IMPLANT
BANDAGE ELASTIC 6 VELCRO ST LF (GAUZE/BANDAGES/DRESSINGS) ×2 IMPLANT
BANDAGE ESMARK 6X9 LF (GAUZE/BANDAGES/DRESSINGS) ×1 IMPLANT
BENZOIN TINCTURE PRP APPL 2/3 (GAUZE/BANDAGES/DRESSINGS) ×2 IMPLANT
BLADE SAG 18X100X1.27 (BLADE) ×2 IMPLANT
BNDG ESMARK 6X9 LF (GAUZE/BANDAGES/DRESSINGS) ×2
BOWL SMART MIX CTS (DISPOSABLE) ×2 IMPLANT
CAPT KNEE TOTAL 3 ×2 IMPLANT
CEMENT BONE 1-PACK (Cement) ×4 IMPLANT
CUFF TOURN SGL QUICK 34 (TOURNIQUET CUFF) ×1
CUFF TRNQT CYL 34X4X40X1 (TOURNIQUET CUFF) ×1 IMPLANT
DRAPE EXTREMITY T 121X128X90 (DRAPE) ×2 IMPLANT
DRAPE POUCH INSTRU U-SHP 10X18 (DRAPES) ×2 IMPLANT
DRAPE U-SHAPE 47X51 STRL (DRAPES) ×2 IMPLANT
DRSG PAD ABDOMINAL 8X10 ST (GAUZE/BANDAGES/DRESSINGS) ×4 IMPLANT
DURAPREP 26ML APPLICATOR (WOUND CARE) ×2 IMPLANT
ELECT REM PT RETURN 9FT ADLT (ELECTROSURGICAL) ×2
ELECTRODE REM PT RTRN 9FT ADLT (ELECTROSURGICAL) ×1 IMPLANT
FACESHIELD WRAPAROUND (MASK) ×10 IMPLANT
GAUZE SPONGE 4X4 12PLY STRL (GAUZE/BANDAGES/DRESSINGS) ×2 IMPLANT
GLOVE BIO SURGEON STRL SZ7.5 (GLOVE) ×2 IMPLANT
GLOVE BIO SURGEON STRL SZ8 (GLOVE) ×2 IMPLANT
GLOVE BIOGEL PI IND STRL 7.0 (GLOVE) ×2 IMPLANT
GLOVE BIOGEL PI IND STRL 7.5 (GLOVE) ×1 IMPLANT
GLOVE BIOGEL PI IND STRL 8 (GLOVE) ×2 IMPLANT
GLOVE BIOGEL PI IND STRL 8.5 (GLOVE) ×1 IMPLANT
GLOVE BIOGEL PI INDICATOR 7.0 (GLOVE) ×2
GLOVE BIOGEL PI INDICATOR 7.5 (GLOVE) ×1
GLOVE BIOGEL PI INDICATOR 8 (GLOVE) ×2
GLOVE BIOGEL PI INDICATOR 8.5 (GLOVE) ×1
GLOVE ECLIPSE 8.0 STRL XLNG CF (GLOVE) ×2 IMPLANT
GLOVE SURG SS PI 7.0 STRL IVOR (GLOVE) ×2 IMPLANT
GOWN STRL REUS W/TWL LRG LVL3 (GOWN DISPOSABLE) ×4 IMPLANT
GOWN STRL REUS W/TWL XL LVL3 (GOWN DISPOSABLE) ×6 IMPLANT
HANDPIECE INTERPULSE COAX TIP (DISPOSABLE) ×1
IMMOBILIZER KNEE 20 (SOFTGOODS) ×2
IMMOBILIZER KNEE 20 THIGH 36 (SOFTGOODS) ×1 IMPLANT
KIT BASIN OR (CUSTOM PROCEDURE TRAY) ×2 IMPLANT
NS IRRIG 1000ML POUR BTL (IV SOLUTION) ×2 IMPLANT
PACK TOTAL JOINT (CUSTOM PROCEDURE TRAY) ×2 IMPLANT
PADDING CAST COTTON 6X4 STRL (CAST SUPPLIES) ×2 IMPLANT
POSITIONER SURGICAL ARM (MISCELLANEOUS) ×2 IMPLANT
SET HNDPC FAN SPRY TIP SCT (DISPOSABLE) ×1 IMPLANT
SET PAD KNEE POSITIONER (MISCELLANEOUS) ×2 IMPLANT
STRIP CLOSURE SKIN 1/2X4 (GAUZE/BANDAGES/DRESSINGS) ×4 IMPLANT
SUCTION FRAZIER 12FR DISP (SUCTIONS) ×2 IMPLANT
SUT MNCRL AB 4-0 PS2 18 (SUTURE) ×2 IMPLANT
SUT VIC AB 0 CT1 27 (SUTURE) ×2
SUT VIC AB 0 CT1 27XBRD ANTBC (SUTURE) ×2 IMPLANT
SUT VIC AB 1 CT1 27 (SUTURE) ×2
SUT VIC AB 1 CT1 27XBRD ANTBC (SUTURE) ×2 IMPLANT
SUT VIC AB 2-0 CT1 27 (SUTURE) ×2
SUT VIC AB 2-0 CT1 TAPERPNT 27 (SUTURE) ×2 IMPLANT
TOWEL OR 17X26 10 PK STRL BLUE (TOWEL DISPOSABLE) ×2 IMPLANT
TOWEL OR NON WOVEN STRL DISP B (DISPOSABLE) ×2 IMPLANT
TRAY FOLEY CATH 14FRSI W/METER (CATHETERS) ×2 IMPLANT
WATER STERILE IRR 1500ML POUR (IV SOLUTION) ×2 IMPLANT
WRAP KNEE MAXI GEL POST OP (GAUZE/BANDAGES/DRESSINGS) ×2 IMPLANT

## 2015-01-03 NOTE — Plan of Care (Signed)
Problem: Consults Goal: Diagnosis- Total Joint Replacement Left total knee 01-03-15

## 2015-01-03 NOTE — Evaluation (Signed)
Physical Therapy Evaluation Patient Details Name: [name] MRN: [id number] DOB: 08-Aug-1941 Today's Date: 01/03/2015   History of Present Illness  L TKR  Clinical Impression  Pt s/p L TKR presents with decreased L LE strength/ROM and post op pain limiting functional mobility.  Pt should progress well to d.c home with family assist and HHPT follow up.    Follow Up Recommendations Home health PT    Equipment Recommendations  Rolling walker with 5" wheels    Recommendations for Other Services OT consult     Precautions / Restrictions Precautions Precautions: Knee;Fall Required Braces or Orthoses: Knee Immobilizer - Left Knee Immobilizer - Left: Discontinue once straight leg raise with < 10 degree lag Restrictions Weight Bearing Restrictions: No Other Position/Activity Restrictions: WBAT      Mobility  Bed Mobility Overal bed mobility: Needs Assistance;+2 for physical assistance Bed Mobility: Supine to Sit;Sit to Supine     Supine to sit: Min assist;Mod assist;+2 for physical assistance;+2 for safety/equipment Sit to supine: Min assist;Mod assist;+2 for physical assistance;+2 for safety/equipment   General bed mobility comments: cues for sequence and use of R LE to self assist   Transfers Overall transfer level: Needs assistance Equipment used: Rolling walker (2 wheeled) Transfers: Sit to/from Stand Sit to Stand: Min assist;Mod assist;+2 physical assistance;+2 safety/equipment         General transfer comment: cues for LE management and use of UEs to self assist  Ambulation/Gait Ambulation/Gait assistance: +2 safety/equipment;+2 physical assistance Ambulation Distance (Feet): 0 Feet Assistive device: Rolling walker (2 wheeled)       General Gait Details: Pt stood x 3 min only - did not ambulate 2* increasing nausea  Stairs            Wheelchair Mobility    Modified Rankin (Stroke Patients Only)       Balance                                              Pertinent Vitals/Pain Pain Assessment: 0-10 Pain Score: 5  Pain Location: L knee Pain Descriptors / Indicators: Aching;Sore Pain Intervention(s): Limited activity within patient's tolerance;Monitored during session;Premedicated before session;Ice applied    Home Living Family/patient expects to be discharged to:: Private residence Living Arrangements: Spouse/significant other Available Help at Discharge: Family Type of Home: House Home Access: Stairs to enter Entrance Stairs-Rails: Right Entrance Stairs-Number of Steps: 5+1 Home Layout: One level Home Equipment: Cane - single point      Prior Function Level of Independence: Independent;Independent with assistive device(s)               Hand Dominance   Dominant Hand: Right    Extremity/Trunk Assessment   Upper Extremity Assessment: Overall WFL for tasks assessed           Lower Extremity Assessment: LLE deficits/detail   LLE Deficits / Details: 2/5 quads with AAROM at knee -10 - 45  Cervical / Trunk Assessment: Normal  Communication   Communication: No difficulties  Cognition Arousal/Alertness: Awake/alert Behavior During Therapy: WFL for tasks assessed/performed Overall Cognitive Status: Within Functional Limits for tasks assessed                      General Comments      Exercises Total Joint Exercises Ankle Circles/Pumps: AROM;Both;10 reps;Supine Quad Sets: AROM;Both;10 reps;Supine Heel Slides: AAROM;Left;5 reps;Supine Straight  Leg Raises: AAROM;Left;5 reps;Supine      Assessment/Plan    PT Assessment Patient needs continued PT services  PT Diagnosis Difficulty walking   PT Problem List Decreased strength;Decreased range of motion;Decreased activity tolerance;Decreased mobility;Decreased knowledge of use of DME;Pain  PT Treatment Interventions DME instruction;Gait training;Stair training;Functional mobility training;Therapeutic activities;Therapeutic  exercise;Patient/family education   PT Goals (Current goals can be found in the Care Plan section) Acute Rehab PT Goals Patient Stated Goal: Resume previous lifestyle with decreased pain PT Goal Formulation: With patient Time For Goal Achievement: 01/10/15 Potential to Achieve Goals: Good    Frequency 7X/week   Barriers to discharge        Co-evaluation               End of Session Equipment Utilized During Treatment: Gait belt;Left knee immobilizer Activity Tolerance: Patient limited by fatigue;Other (comment) (nausea) Patient left: in bed;with call bell/phone within reach Nurse Communication: Mobility status         Time: 8184-0375 PT Time Calculation (min) (ACUTE ONLY): 30 min   Charges:   PT Evaluation $Initial PT Evaluation Tier I: 1 Procedure PT Treatments $Therapeutic Activity: 8-22 mins   PT G Codes:        Aliayah Tyer 01-11-15, 4:49 PM

## 2015-01-03 NOTE — Progress Notes (Signed)
Utilization review completed.  

## 2015-01-03 NOTE — Anesthesia Procedure Notes (Signed)
Spinal Patient location during procedure: OR Start time: 01/03/2015 7:27 AM End time: 01/03/2015 7:37 AM Staffing Anesthesiologist: Rod Mae L Performed by: anesthesiologist  Preanesthetic Checklist Completed: patient identified, site marked, surgical consent, pre-op evaluation, timeout performed, IV checked, risks and benefits discussed and monitors and equipment checked Spinal Block Patient position: sitting Prep: Betadine Patient monitoring: heart rate, continuous pulse ox and blood pressure Approach: midline Location: L4-5 Injection technique: single-shot Needle Needle type: Spinocan  Needle gauge: 22 G Needle length: 9 cm Assessment Sensory level: T6 Additional Notes Expiration date of kit checked and confirmed. Patient tolerated procedure well, without complications.

## 2015-01-03 NOTE — Anesthesia Postprocedure Evaluation (Signed)
  Anesthesia Post-op Note  Patient: [name]  Procedure(s) Performed: Procedure(s) (LRB): LEFT TOTAL KNEE ARTHROPLASTY (Left)  Patient Location: PACU  Anesthesia Type: General  Level of Consciousness: awake and alert   Airway and Oxygen Therapy: Patient Spontanous Breathing  Post-op Pain: mild  Post-op Assessment: Post-op Vital signs reviewed, Patient's Cardiovascular Status Stable, Respiratory Function Stable, Patent Airway and No signs of Nausea or vomiting  Last Vitals:  Filed Vitals:   01/03/15 0930  BP: 109/66  Pulse: 63  Temp: 36.6 C  Resp: 14    Post-op Vital Signs: stable   Complications: No apparent anesthesia complications

## 2015-01-03 NOTE — Addendum Note (Signed)
Addendum  created 01/03/15 1138 by Sharlette Dense, CRNA   Modules edited: Anesthesia LDA, Anesthesia Medication Administration, Lines/Drains/Airways Properties Editor   Lines/Drains/Airways Properties Editor:  Properties of line/drain/airway/wound [REMOVED] Airway have been modified.; Properties of line/drain/airway/wound Airway have been modified.

## 2015-01-03 NOTE — Transfer of Care (Signed)
Immediate Anesthesia Transfer of Care Note  Patient: [name]  Procedure(s) Performed: Procedure(s): LEFT TOTAL KNEE ARTHROPLASTY (Left)  Patient Location: PACU  Anesthesia Type:Spinal  Level of Consciousness: awake, alert  and oriented  Airway & Oxygen Therapy: Patient Spontanous Breathing and Patient connected to nasal cannula oxygen  Post-op Assessment: Report given to RN and Post -op Vital signs reviewed and stable  Post vital signs: Reviewed and stable  Last Vitals:  Filed Vitals:   01/03/15 0529  BP: 149/71  Pulse: 58  Temp: 36.6 C  Resp: 16    Complications: No apparent anesthesia complications

## 2015-01-03 NOTE — Brief Op Note (Signed)
01/03/2015  9:03 AM  PATIENT:  [name]  74 y.o. female  PRE-OPERATIVE DIAGNOSIS:  Primary osteoarthritis left knee  POST-OPERATIVE DIAGNOSIS:  Primary osteoarthritis left knee  PROCEDURE:  Procedure(s): LEFT TOTAL KNEE ARTHROPLASTY (Left)  SURGEON:  Surgeon(s) and Role:    * Mcarthur Rossetti, MD - Primary  PHYSICIAN ASSISTANT: Benita Stabile, PA-C  ANESTHESIA:   local and spinal  EBL:  Total I/O In: 1000 [I.V.:1000] Out: 250 [Urine:200; Blood:50]  BLOOD ADMINISTERED:none  DRAINS: none   LOCAL MEDICATIONS USED:  OTHER Exepril  SPECIMEN:  No Specimen  DISPOSITION OF SPECIMEN:  N/A  COUNTS:  YES  TOURNIQUET:   Total Tourniquet Time Documented: Thigh (Left) - 51 minutes Total: Thigh (Left) - 51 minutes   DICTATION: .Other Dictation: Dictation Number [id number]  PLAN OF CARE: Admit to inpatient   PATIENT DISPOSITION:  PACU - hemodynamically stable.   Delay start of Pharmacological VTE agent (>24hrs) due to surgical blood loss or risk of bleeding: no

## 2015-01-03 NOTE — H&P (Signed)
TOTAL KNEE ADMISSION H&P  Patient is being admitted for left total knee arthroplasty.  Subjective:  Chief Complaint:left knee pain.  HPI: [name], 74 y.o. female, has a history of pain and functional disability in the left knee due to arthritis and has failed non-surgical conservative treatments for greater than 12 weeks to includeNSAID's and/or analgesics, corticosteriod injections, viscosupplementation injections, flexibility and strengthening excercises and activity modification.  Onset of symptoms was gradual, starting 5 years ago with gradually worsening course since that time. The patient noted no past surgery on the left knee(s).  Patient currently rates pain in the left knee(s) at 10 out of 10 with activity. Patient has night pain, worsening of pain with activity and weight bearing, pain that interferes with activities of daily living, pain with passive range of motion, crepitus and joint swelling.  Patient has evidence of subchondral sclerosis, periarticular osteophytes and joint space narrowing by imaging studies. There is no active infection.  Patient Active Problem List   Diagnosis Date Noted  . Osteoarthritis of left knee 01/03/2015  . Preop cardiovascular exam 12/12/2014  . PAF (paroxysmal atrial fibrillation)   . Dyslipidemia   . Fever 05/03/2013    Class: Acute  . Nausea 05/03/2013    Class: Acute  . Constipation 05/03/2013    Class: Acute  . Syncope, history of 04/19/2013  . History of atrial fibrillation 04/19/2013  . Essential hypertension 04/19/2013  . Hypothyroidism 04/19/2013  . Malignant neoplasm of ovary 03/11/2013  . Elevated cancer antigen 125 (CA 125) 01/05/2013  . Degenerative arthritis of hip 02/25/2012   Past Medical History  Diagnosis Date  . Hypertension   . Dyslipidemia   . Hypothyroidism   . Arthritis     OSTEOARTHRITIS   -- CONSTANT PAIN RIGHT HIP---AND PAIN LEFT KNEE--PT STATES SHE GETS INJECTIONS INTO HER KNEE  . Ovarian cancer   .  PAF (paroxysmal atrial fibrillation)     Only on Plavix -- not full Anticoagulation per pt. request  . Complication of anesthesia     BLOOD PRESSURE DROPPED WITH NASAL SURGERY, ONE OF THE CARPAL TUNNEL REPAIRS AND DURING A COLONOSCOPY  . History of skin cancer   . Constipation     Past Surgical History  Procedure Laterality Date  . Bilateral carpal tunnel repair  2007  . Dilation and curettage of uterus  1969  . Surgery for ruptured ovarian cyst  1969  . Rhinoplasty for fractured nose  1986  . Left knee arthroscopy   2011  . Total hip arthroplasty  02/25/2012    Procedure: TOTAL HIP ARTHROPLASTY ANTERIOR APPROACH;  Surgeon: Mcarthur Rossetti, MD;  Location: [facility];  Service: Orthopedics;  Laterality: Right;  . Tonsillectomy  1962  . Laparotomy Bilateral 02/13/2013    Procedure: EXPLORATORY LAPAROTOMY TOTAL ABDOMINAL HYSTERECTOMY BILATERAL SALPINGO-OOPHORECTOMY, Partial Rectal Resection with Reanastamosis;  Surgeon: Alvino Chapel, MD;  Location: [facility];  Service: Gynecology;  Laterality: Bilateral;  . Omentectomy  02/13/2013    Procedure: OMENTECTOMY;  Surgeon: Alvino Chapel, MD;  Location: [facility];  Service: Gynecology;;  . Lymphadenectomy Right 02/13/2013    Procedure: PEVLIC  LYMPHADENECTOMY, DEBULKING right pelvic tumor nodules;  Surgeon: Alvino Chapel, MD;  Location: [facility];  Service: Gynecology;  Laterality: Right;  . Total abdominal hysterectomy  01/2013  . Transthoracic echocardiogram  May 2014    Normal LV size function. EF 60-65%. Grade 1 diastolic function. Mild MR and mildly elevated PA pressures of 37 mmHg per  . [facility]  June 2010     subbmaximal with no ischemia or infarction.    Prescriptions prior to admission  Medication Sig Dispense Refill Last Dose  . acetaminophen (TYLENOL) 500 MG tablet Take 1,000 mg by mouth every 6 (six) hours as needed.   Past Week at Unknown time  . aspirin EC 81 MG tablet Take 81 mg by mouth every morning.     Past Week at Unknown time  . clopidogrel (PLAVIX) 75 MG tablet Take 1 tablet (75 mg total) by mouth at bedtime. 90 tablet 2 12/28/14  . docusate sodium (COLACE) 100 MG capsule Take 100 mg by mouth 2 (two) times daily.   01/02/2015 at Unknown time  . estradiol (ESTRACE) 1 MG tablet Take 1 tablet (1 mg total) by mouth every morning. PT STATES SHE WANTS ESTRACE--NOT THE GENERIC 90 tablet 3 01/03/2015 at 0400  . Glucosamine HCl (GLUCOSAMINE PO) Take 1,000 mg by mouth every morning.    01/02/2015 at Unknown time  . levothyroxine (SYNTHROID, LEVOTHROID) 88 MCG tablet Take 88 mcg by mouth every morning. Takes on an empty stomach. PT STATES SHE NEEDS THE SYNTHROID--DOES NOT WANT THE GENERIC   01/03/2015 at 0400  . Menthol, Topical Analgesic, (ICY HOT EX) Apply 1 application topically daily as needed (pain.).   01/02/2015 at Unknown time  . metoprolol tartrate (LOPRESSOR) 25 MG tablet Take 25 mg by mouth 2 (two) times daily.   01/03/2015 at 0445  . Multiple Vitamin (MULTIVITAMIN WITH MINERALS) TABS tablet Take 1 tablet by mouth every morning.    Past Week at Unknown time  . Polyethyl Glycol-Propyl Glycol (SYSTANE OP) Apply 1 drop to eye 2 (two) times daily.   01/03/2015 at 0445  . polyethylene glycol (MIRALAX / GLYCOLAX) packet Take 17 g by mouth every morning.    01/02/2015 at Unknown time  . potassium chloride (K-DUR) 10 MEQ tablet Take 10 mEq by mouth every Monday, Wednesday, and Friday.   Past Week at Unknown time  . propafenone (RYTHMOL) 225 MG tablet Take 1 tablet (225 mg total) by mouth 2 (two) times daily. 180 tablet 3 01/03/2015 at 0445  . traMADol (ULTRAM) 50 MG tablet Take 50 mg by mouth every 8 (eight) hours.   01/02/2015 at 2000  . Wheat Dextrin (BENEFIBER) POWD Take 2 scoop by mouth every morning.    01/02/2015 at Unknown time   Allergies  Allergen Reactions  . Codeine Other (See Comments)    Does not like the feeling she gets  . Lisinopril     LIP NUMBNESS  . Tape Rash    History  Substance Use Topics  .  Smoking status: Never Smoker   . Smokeless tobacco: Never Used  . Alcohol Use: No    Family History  Problem Relation Age of Onset  . Hypertension Mother      Review of Systems  Musculoskeletal: Positive for joint pain.  All other systems reviewed and are negative.   Objective:  Physical Exam  Constitutional: She is oriented to person, place, and time. She appears well-developed and well-nourished.  HENT:  Head: Normocephalic and atraumatic.  Eyes: EOM are normal. Pupils are equal, round, and reactive to light.  Neck: Normal range of motion. Neck supple.  Cardiovascular: Normal rate.   Respiratory: Breath sounds normal.  GI: Soft. Bowel sounds are normal.  Musculoskeletal:       Left knee: She exhibits decreased range of motion, swelling and effusion. Tenderness found. Medial joint line and lateral joint  line tenderness noted.  Neurological: She is alert and oriented to person, place, and time.  Skin: Skin is warm and dry.  Psychiatric: She has a normal mood and affect.    Vital signs in last 24 hours: Temp:  [97.9 F (36.6 C)] 97.9 F (36.6 C) (02/05 0529) Pulse Rate:  [58] 58 (02/05 0529) Resp:  [16] 16 (02/05 0529) BP: (149)/(71) 149/71 mmHg (02/05 0529) SpO2:  [100 %] 100 % (02/05 0529) Weight:  [62.596 kg (138 lb)] 62.596 kg (138 lb) (02/05 0555)  Labs:   Estimated body mass index is 24.45 kg/(m^2) as calculated from the following:   Height as of this encounter: 5\' 3"  (1.6 m).   Weight as of this encounter: 62.596 kg (138 lb).   Imaging Review Plain radiographs demonstrate severe degenerative joint disease of the left knee(s). The overall alignment ismild varus. The bone quality appears to be good for age and reported activity level.  Assessment/Plan:  End stage arthritis, left knee   The patient history, physical examination, clinical judgment of the provider and imaging studies are consistent with end stage degenerative joint disease of the left knee(s)  and total knee arthroplasty is deemed medically necessary. The treatment options including medical management, injection therapy arthroscopy and arthroplasty were discussed at length. The risks and benefits of total knee arthroplasty were presented and reviewed. The risks due to aseptic loosening, infection, stiffness, patella tracking problems, thromboembolic complications and other imponderables were discussed. The patient acknowledged the explanation, agreed to proceed with the plan and consent was signed. Patient is being admitted for inpatient treatment for surgery, pain control, PT, OT, prophylactic antibiotics, VTE prophylaxis, progressive ambulation and ADL's and discharge planning. The patient is planning to be discharged home with home health services

## 2015-01-03 NOTE — Op Note (Signed)
NAMEELIZZIE, WESTERGARD NO.:  [id number]  MEDICAL RECORD NO.:  [id number]  LOCATION:  [facility]                         FACILITY:  [facility]  PHYSICIAN:  Lind Guest. Ninfa Linden, M.D.DATE OF BIRTH:  [birth year]  DATE OF PROCEDURE:  01/03/2015 DATE OF DISCHARGE:                              OPERATIVE REPORT   PREOPERATIVE DIAGNOSES:  Primary osteoarthritis and degenerative joint disease, left knee.  POSTOPERATIVE DIAGNOSES:  Primary osteoarthritis and degenerative joint disease, left knee.  PROCEDURE:  Left total knee arthroplasty.  IMPLANTS:  Stryker Triathlon knee with size 3 femur, size 3 tibial tray, 9 mm fixed bearing polyethylene insert, size 28 patellar button.  SURGEON:  Lind Guest. Ninfa Linden, M.D.  ASSISTANT:  Erskine Emery, PA-C  ANESTHESIA: 1. Spinal. 2. Local with Exparel in the joint.  ANTIBIOTICS:  2 g of IV Ancef.  TOURNIQUET TIME:  Less than 1 hour.  BLOOD LOSS:  Less than 100 mL.  COMPLICATIONS:  None.  INDICATIONS:  Ms. Rhude is a 74 year old patient well known to me. She has been dealing with primary osteoarthritis of her left knee for many years now.  It has gotten to where it affects her activities of daily living.  She has had significant swelling and crepitation in her knee.  Her mobility is limited and her quality of life has now taken a significant impact.  She does wish to proceed with a total knee arthroplasty given the failure of all conservative treatment measures. She understands the risks of acute blood loss anemia, nerve and vessel injury, fracture, infection, and DVT.  She understands the goals are decreased pain, improved mobility, and overall improved quality of life.  PROCEDURE DESCRIPTION:  After informed consent was obtained, appropriate left knee was marked.  She was brought to the operating room and spinal anesthesia was obtained while she was on the operating table setting up. She was then laid in a supine  position.  A Foley catheter was placed and a nonsterile tourniquet was placed around her upper left thigh.  Her left leg was then prepped and draped with DuraPrep and sterile drapes. A time-out was called, she was identified as correct patient, correct left knee.  We then used an Esmarch to wrap out the leg and tourniquet was inflated to 300 mm of pressure.  I made a direct midline incision over the patella and carried this proximally and distally.  We dissected down the knee joint and carried out medial parapatellar arthrotomy and found a very large joint effusion and significant synovitis throughout the knee.  We found areas devoid of cartilage throughout the knee.  We removed osteophytes from the knee, remnants of ACL, PCL, medial, and lateral meniscus.  With the knee in a flexed position using the extramedullary cutting guide, we set our guide to take 9 mm off the high side.  We corrected her varus valgus and neutral slope.  We then made this cut without difficulty.  Using an intramedullary guide for the femur, we set our femoral cutting block to 5 degrees externally rotated and to take 10 mm off of the distal tibia due to her pre-existing flexion contracture.  We then made this cut and brought the knee back  down in extension with our extension block.  She had full extension with the 9 mm extension block.  We then went back to the femur and set our femoral sizing guide based off the epicondylar axis and Whitesides line. We chose a size 3 femur based off this.  We then placed our 4:1 cutting block for a size 3 femur, made our anterior and posterior cuts followed by our chamfer cuts.  We then made our femoral box cut based off the size 3 left femur.  We then went down to the tibia and sized for a size 3 tibia.  We set our rotation based off the tibial tubercle and made our keel punch for a size 3 tibial tray.  Then with the 3 tibial tray and a 3 femur, we trialed a 9 mm polyethylene  insert.  I was pleased with the stability and range of motion.  I then used free hand cut on the patella because her patella was very thin and we drilled holes for a 28 patellar button.  With all trial components in place, again I was pleased with stability and range of motion.  We then removed all trial components to mix our cement.  We irrigated the knee with normal saline solution and then placed Exparel around the joint capsule.  Once the cement was starting to harden, we cemented our tibial tray followed by our femoral tray from [employer]ker both size 3.  We cleaned the cement debris from the knee, placed the real 9 mm fixed bearing polyethylene insert and cemented the patellar button.  Once the cement had hardened, we let the tourniquet down, cleaned the cement debris from the knee and irrigated the knee again with normal saline solution.  We let the tourniquet down. Hemostasis was obtained with electrocautery.  We then closed the arthrotomy with interrupted #1 Vicryl suture followed by 0-Vicryl in the deep tissue, 2-0 Vicryl in the subcutaneous tissue, 4-0 Monocryl subcuticular stitch, and Steri-Strips.  A well-padded sterile dressing was applied.  She was taken off of the table and taken to the recovery room in stable condition.  All final counts were correct and there were no complications noted.  Of note, Erskine Emery, PA-C assisted during the entire case and his assistance was crucial for facilitating every aspect of this case and was medically necessary.     Lind Guest. Ninfa Linden, M.D.     CYB/MEDQ  D:  01/03/2015  T:  01/03/2015  Job:  [id number]

## 2015-01-04 LAB — BASIC METABOLIC PANEL
Anion gap: 7 (ref 5–15)
BUN: 9 mg/dL (ref 6–23)
CO2: 32 mmol/L (ref 19–32)
CREATININE: 0.5 mg/dL (ref 0.50–1.10)
Calcium: 8.6 mg/dL (ref 8.4–10.5)
Chloride: 102 mmol/L (ref 96–112)
GFR calc Af Amer: >90 mL/min (ref >90)
GFR calc non Af Amer: >90 mL/min (ref >90)
GLUCOSE: 104 mg/dL — AB (ref 70–99)
POTASSIUM: 3.8 mmol/L (ref 3.5–5.1)
Sodium: 141 mmol/L (ref 135–145)

## 2015-01-04 LAB — CBC
HEMATOCRIT: 26.7 % — AB (ref 36.0–46.0)
Hemoglobin: 9.4 g/dL — ABNORMAL LOW (ref 12.0–15.0)
MCH: 34.7 pg — ABNORMAL HIGH (ref 26.0–34.0)
MCHC: 35.2 g/dL (ref 30.0–36.0)
MCV: 98.5 fL (ref 78.0–100.0)
Platelets: 112 10*3/uL — ABNORMAL LOW (ref 150–400)
RBC: 2.71 MIL/uL — ABNORMAL LOW (ref 3.87–5.11)
RDW: 12.3 % (ref 11.5–15.5)
WBC: 8 10*3/uL (ref 4.0–10.5)

## 2015-01-04 MED ORDER — KETOROLAC TROMETHAMINE 15 MG/ML IJ SOLN
7.5000 mg | Freq: Four times a day (QID) | INTRAMUSCULAR | Status: DC | PRN
  Administered 2015-01-04: 7.5 mg via INTRAVENOUS
  Filled 2015-01-04: qty 1

## 2015-01-04 NOTE — Progress Notes (Signed)
CARE MANAGEMENT NOTE 01/04/2015  Patient:  ILISA, HAYWORTH   Account Number:  [id number]  Date Initiated:  01/04/2015  Documentation initiated by:  [facility]  Subjective/Objective Assessment:   LEFT TOTAL KNEE ARTHROPLASTY     Action/Plan:   from home   Anticipated DC Date:  01/06/2015   Anticipated DC Plan:  [place]  CM consult      [facility] Choice  HOME HEALTH   Choice offered to / List presented to:  C-1 Patient   DME arranged  Vassie Moselle      DME agency  [place] arranged  HH-2 PT      [place]   Status of service:  Completed, signed off Medicare Important Message given?   (If response is "NO", the following Medicare IM given date fields will be blank) Date Medicare IM given:   Medicare IM given by:   Date Additional Medicare IM given:   Additional Medicare IM given by:    Discharge Disposition:  [place]  Per UR Regulation:    If discussed at Long Length of Stay Meetings, dates discussed:    Comments:  01/04/2015 1115 NCM spoke to pt and offered choice for [facility]. Pt agreeable to [facility] for [facility]. Requesting RW for home. She has tub seat at home. Husband at home to assist with her care. Jonnie Finner RN CCM Case Mgmt phone [phone]

## 2015-01-04 NOTE — Progress Notes (Signed)
Physical Therapy Treatment Patient Details Name: [name] MRN: [id number] DOB: 03/18/1941 Today's Date: Jan 10, 2015    History of Present Illness L TKR    PT Comments    Pt motivated but ltd by nausea/dizziness with move to sitting - BP 92/49 - RN aware  Follow Up Recommendations  Home health PT     Equipment Recommendations  Rolling walker with 5" wheels    Recommendations for Other Services OT consult     Precautions / Restrictions Precautions Precautions: Knee;Fall Required Braces or Orthoses: Knee Immobilizer - Left Knee Immobilizer - Left: Discontinue once straight leg raise with < 10 degree lag Restrictions Weight Bearing Restrictions: No Other Position/Activity Restrictions: WBAT    Mobility  Bed Mobility Overal bed mobility: Needs Assistance Bed Mobility: Supine to Sit;Sit to Supine     Supine to sit: Min assist;Mod assist Sit to supine: Min assist;Mod assist   General bed mobility comments: cues for sequence and use of R LE to self assist   Transfers                 General transfer comment: NT 2* nausea /dizziness in sitting  Ambulation/Gait                 Stairs            Wheelchair Mobility    Modified Rankin (Stroke Patients Only)       Balance                                    Cognition Arousal/Alertness: Awake/alert Behavior During Therapy: WFL for tasks assessed/performed Overall Cognitive Status: Within Functional Limits for tasks assessed                      Exercises Total Joint Exercises Ankle Circles/Pumps: AROM;Both;10 reps;Supine Quad Sets: AROM;Both;10 reps;Supine Heel Slides: AAROM;Left;Supine;15 reps Straight Leg Raises: AAROM;Left;Supine;10 reps Goniometric ROM: AAROM at L knee -10 - 40    General Comments        Pertinent Vitals/Pain Pain Assessment: 0-10 Pain Score: 5  Pain Location: L knee Pain Descriptors / Indicators: Aching;Sore Pain  Intervention(s): Limited activity within patient's tolerance;Monitored during session;Premedicated before session;Ice applied    Home Living                      Prior Function            PT Goals (current goals can now be found in the care plan section) Acute Rehab PT Goals Patient Stated Goal: Resume previous lifestyle with decreased pain PT Goal Formulation: With patient Time For Goal Achievement: 01/10/15 Potential to Achieve Goals: Good Progress towards PT goals: Progressing toward goals    Frequency  7X/week    PT Plan Current plan remains appropriate    Co-evaluation             End of Session Equipment Utilized During Treatment: Gait belt;Left knee immobilizer Activity Tolerance: Patient limited by fatigue;Patient limited by pain;Other (comment) (nausea) Patient left: in bed;with call bell/phone within reach     Time: 1228-1251 PT Time Calculation (min) (ACUTE ONLY): 23 min  Charges:  $Therapeutic Exercise: 8-22 mins $Therapeutic Activity: 8-22 mins                    G Codes:      Tashan Kreitzer January 10, 2015, 4:08 PM

## 2015-01-04 NOTE — Progress Notes (Signed)
Physical Therapy Treatment Patient Details Name: [name] MRN: [id number] DOB: [birth year] Today's Date: 01/08/15    History of Present Illness L TKR    PT Comments    Pt to/from bathroom - no c/o dizziness/nausea/lightheadedness this session  Follow Up Recommendations  Home health PT     Equipment Recommendations  Rolling walker with 5" wheels    Recommendations for Other Services OT consult     Precautions / Restrictions Precautions Precautions: Knee;Fall Required Braces or Orthoses: Knee Immobilizer - Left Knee Immobilizer - Left: Discontinue once straight leg raise with < 10 degree lag Restrictions Weight Bearing Restrictions: No Other Position/Activity Restrictions: WBAT    Mobility  Bed Mobility Overal bed mobility: Needs Assistance Bed Mobility: Supine to Sit     Supine to sit: Min assist     General bed mobility comments: cues for sequence and use of R LE to self assist   Transfers Overall transfer level: Needs assistance Equipment used: Rolling walker (2 wheeled) Transfers: Sit to/from Stand Sit to Stand: Min assist;Min guard         General transfer comment: cues for LE management and use of UEs to self assist  Ambulation/Gait Ambulation/Gait assistance: Min guard Ambulation Distance (Feet): 15 Feet (15' twice to/from bathroom) Assistive device: Rolling walker (2 wheeled) Gait Pattern/deviations: Step-to pattern;Decreased step length - right;Decreased step length - left;Shuffle;Trunk flexed Gait velocity: decr   General Gait Details: Cues for posture, sequence and position from [employer]            Wheelchair Mobility    Modified Rankin (Stroke Patients Only)       Balance                                    Cognition Arousal/Alertness: Awake/alert Behavior During Therapy: WFL for tasks assessed/performed Overall Cognitive Status: Within Functional Limits for tasks assessed                       Exercises      General Comments        Pertinent Vitals/Pain Pain Assessment: 0-10 Pain Score: 4  Pain Location: L knee Pain Descriptors / Indicators: Aching;Sore Pain Intervention(s): Limited activity within patient's tolerance;Monitored during session;Premedicated before session;Ice applied    Home Living                      Prior Function            PT Goals (current goals can now be found in the care plan section) Acute Rehab PT Goals Patient Stated Goal: Resume previous lifestyle with decreased pain PT Goal Formulation: With patient Time For Goal Achievement: 01/10/15 Potential to Achieve Goals: Good Progress towards PT goals: Progressing toward goals    Frequency  7X/week    PT Plan Current plan remains appropriate    Co-evaluation             End of Session Equipment Utilized During Treatment: Gait belt;Left knee immobilizer Activity Tolerance: Patient tolerated treatment well Patient left: in chair;with call bell/phone within reach;with family/visitor present     Time: 1594-5859 PT Time Calculation (min) (ACUTE ONLY): 18 min  Charges:  $Gait Training: 8-22 mins                    G Codes:      Leviathan Macera 01-08-2015, 4:48  PM   

## 2015-01-04 NOTE — Plan of Care (Signed)
Problem: Phase II Progression Outcomes Goal: Ambulates Outcome: Not Met (add Reason) Pt became dizzy & weak when brought to sitting on side of bed

## 2015-01-04 NOTE — Progress Notes (Signed)
Physical Therapy Treatment Patient Details Name: [name] MRN: [id number] DOB: 02/09/1941 Today's Date: 01/04/2015    History of Present Illness L TKR    PT Comments    Pt progressed to ambulating ltd distance in hall - ltd by c/o mild lightheadedness and concerns re nausea - BP 105/53 - RN aware  Follow Up Recommendations  Home health PT     Equipment Recommendations  Rolling walker with 5" wheels    Recommendations for Other Services OT consult     Precautions / Restrictions Precautions Precautions: Knee;Fall Required Braces or Orthoses: Knee Immobilizer - Left Knee Immobilizer - Left: Discontinue once straight leg raise with < 10 degree lag Restrictions Weight Bearing Restrictions: No Other Position/Activity Restrictions: WBAT    Mobility  Bed Mobility Overal bed mobility: Needs Assistance Bed Mobility: Supine to Sit     Supine to sit: Min assist Sit to supine: Min assist;Mod assist   General bed mobility comments: cues for sequence and use of R LE to self assist   Transfers Overall transfer level: Needs assistance Equipment used: Rolling walker (2 wheeled) Transfers: Sit to/from Stand Sit to Stand: Min assist;Mod assist         General transfer comment: cues for LE management and use of UEs to self assist  Ambulation/Gait Ambulation/Gait assistance: Min assist;Mod assist Ambulation Distance (Feet): 39 Feet Assistive device: Rolling walker (2 wheeled) Gait Pattern/deviations: Step-to pattern;Decreased step length - right;Decreased step length - left;Shuffle;Trunk flexed Gait velocity: decr   General Gait Details: Cues for posture, sequence and position from [employer]            Wheelchair Mobility    Modified Rankin (Stroke Patients Only)       Balance                                    Cognition Arousal/Alertness: Awake/alert Behavior During Therapy: WFL for tasks assessed/performed Overall Cognitive  Status: Within Functional Limits for tasks assessed                      Exercises Total Joint Exercises Ankle Circles/Pumps: AROM;Both;10 reps;Supine Quad Sets: AROM;Both;10 reps;Supine Heel Slides: AAROM;Left;Supine;15 reps Straight Leg Raises: AAROM;Left;Supine;10 reps Goniometric ROM: AAROM at L knee -10 - 40    General Comments        Pertinent Vitals/Pain Pain Assessment: 0-10 Pain Score: 4  Pain Location: L knee Pain Descriptors / Indicators: Aching;Sore Pain Intervention(s): Limited activity within patient's tolerance;Monitored during session;Premedicated before session    Home Living                      Prior Function            PT Goals (current goals can now be found in the care plan section) Acute Rehab PT Goals Patient Stated Goal: Resume previous lifestyle with decreased pain PT Goal Formulation: With patient Time For Goal Achievement: 01/10/15 Potential to Achieve Goals: Good Progress towards PT goals: Progressing toward goals    Frequency  7X/week    PT Plan Current plan remains appropriate    Co-evaluation             End of Session Equipment Utilized During Treatment: Gait belt;Left knee immobilizer Activity Tolerance: Patient limited by fatigue;Other (comment) Patient left: in chair;with call bell/phone within reach     Time: 4098-1191 PT Time Calculation (  min) (ACUTE ONLY): 19 min  Charges:  $Gait Training: 8-22 mins $Therapeutic Exercise: 8-22 mins $Therapeutic Activity: 8-22 mins                    G Codes:      [name] 2015/01/06, 4:11 PM

## 2015-01-04 NOTE — Progress Notes (Signed)
OT Cancellation Note  Patient Details Name: [name] MRN: [id number] DOB: [birth year]   Cancelled Treatment:    Reason Eval/Treat Not Completed: Other (comment).  Pt is nauseous. Will check back later today or tomorrow.  Burna Atlas 01/04/2015, 11:24 AM  Lesle Chris, OTR/L [phone] 01/04/2015

## 2015-01-04 NOTE — Progress Notes (Signed)
Subjective: 1 Day Post-Op Procedure(s) (LRB): LEFT TOTAL KNEE ARTHROPLASTY (Left) Patient reports pain as moderate.  Acute blood loss anemia, but stable vitals.  Objective: Vital signs in last 24 hours: Temp:  [97.5 F (36.4 C)-98.6 F (37 C)] 98.4 F (36.9 C) (02/06 1004) Pulse Rate:  [54-75] 75 (02/06 1004) Resp:  [14-16] 16 (02/06 1004) BP: (124-146)/(58-73) 132/64 mmHg (02/06 1004) SpO2:  [97 %-100 %] 100 % (02/06 1004) FiO2 (%):  [2 %] 2 % (02/05 1259)  Intake/Output from previous day: 02/05 0701 - 02/06 0700 In: 3752.5 [P.O.:480; I.V.:3172.5; IV Piggyback:100] Out: 2200 [Urine:2100; Blood:100] Intake/Output this shift: Total I/O In: 240 [P.O.:240] Out: 400 [Urine:400]   Recent Labs  01/04/15 0515  HGB 9.4*    Recent Labs  01/04/15 0515  WBC 8.0  RBC 2.71*  HCT 26.7*  PLT 112*    Recent Labs  01/04/15 0515  NA 141  K 3.8  CL 102  CO2 32  BUN 9  CREATININE 0.50  GLUCOSE 104*  CALCIUM 8.6   No results for input(s): LABPT, INR in the last 72 hours.  Sensation intact distally Intact pulses distally Dorsiflexion/Plantar flexion intact Incision: scant drainage No cellulitis present Compartment soft  Assessment/Plan: 1 Day Post-Op Procedure(s) (LRB): LEFT TOTAL KNEE ARTHROPLASTY (Left) Up with therapy  Will try Toradol for pain today.  Mcarthur Rossetti 01/04/2015, 12:21 PM

## 2015-01-05 LAB — CBC
HEMATOCRIT: 25.7 % — AB (ref 36.0–46.0)
Hemoglobin: 8.8 g/dL — ABNORMAL LOW (ref 12.0–15.0)
MCH: 34 pg (ref 26.0–34.0)
MCHC: 34.2 g/dL (ref 30.0–36.0)
MCV: 99.2 fL (ref 78.0–100.0)
Platelets: 134 10*3/uL — ABNORMAL LOW (ref 150–400)
RBC: 2.59 MIL/uL — ABNORMAL LOW (ref 3.87–5.11)
RDW: 12.7 % (ref 11.5–15.5)
WBC: 7.7 10*3/uL (ref 4.0–10.5)

## 2015-01-05 NOTE — Progress Notes (Signed)
Physical Therapy Treatment Patient Details Name: NELLINE LIO MRN: [id number] DOB: 1941/09/26 Today's Date: 01/05/2015    History of Present Illness L TKR    PT Comments    POD # 2 am session.  Pt reports poor sleep last night and increased knee pain > yesterday.  Applied KI and assisted OOB to amb a limited distance in hallway also due to fatigue.  Practiced going up 4 steps with one rail and spouse assisting. Preformed TKR TE's while following handout HEP and instructed on proper tech and freq aw well as use of ICE.    Follow Up Recommendations  Home health PT     Equipment Recommendations  Rolling walker with 5" wheels    Recommendations for Other Services       Precautions / Restrictions Precautions Precautions: Knee;Fall Precaution Comments: pt instructed on KI use for amb and stairs Required Braces or Orthoses: Knee Immobilizer - Left Knee Immobilizer - Left: Discontinue once straight leg raise with < 10 degree lag Restrictions Weight Bearing Restrictions: No Other Position/Activity Restrictions: WBAT    Mobility  Bed Mobility Overal bed mobility: Needs Assistance Bed Mobility: Supine to Sit     Supine to sit: Min assist     General bed mobility comments: min A with RLE under L; min guard with leg lifter/belt  Transfers Overall transfer level: Needs assistance Equipment used: Rolling walker (2 wheeled) Transfers: Sit to/from Stand Sit to Stand: Min guard         General transfer comment: 25% Vc's on proper tech and hand placement  Ambulation/Gait Ambulation/Gait assistance: Min guard Ambulation Distance (Feet): 22 Feet Assistive device: Rolling walker (2 wheeled) Gait Pattern/deviations: Step-to pattern;Decreased stance time - left;Trunk flexed Gait velocity: decreased   General Gait Details: Cues for posture, sequence and position from [employer]  up/down 4 steps with L rail, KI and spouse.          Wheelchair Mobility    Modified  Rankin (Stroke Patients Only)       Balance                                    Cognition                            Exercises      General Comments        Pertinent Vitals/Pain Pain Assessment: 0-10 Pain Score: 5  Pain Location: L knee Pain Descriptors / Indicators: Sore;Tender Pain Intervention(s): Monitored during session;Premedicated before session;Repositioned;Ice applied    Home Living                      Prior Function            PT Goals (current goals can now be found in the care plan section) Progress towards PT goals: Progressing toward goals    Frequency  7X/week    PT Plan      Co-evaluation             End of Session Equipment Utilized During Treatment: Gait belt;Left knee immobilizer Activity Tolerance: Patient tolerated treatment well Patient left: in chair;with call bell/phone within reach;with family/visitor present     Time: 0925-0955 PT Time Calculation (min) (ACUTE ONLY): 30 min  Charges:  $Gait Training: 8-22 mins $Therapeutic Exercise: 8-22 mins  G Codes:      Rica Koyanagi  PTA [facility]  Acute  Rehab Pager      [phone]

## 2015-01-05 NOTE — Discharge Instructions (Signed)
Pickup stool softener for constipation. Left lower extremity Weight Bearing as tolerated  Progress activities slowly Expect lleft knee soreness and swelling. Apply heat or ice as needed. Keep left knee dressing clean dry and intact, may shower with dressing intact.

## 2015-01-05 NOTE — Progress Notes (Signed)
Patient ID: [name], female   DOB: 1941/06/11, 74 y.o.   MRN: [id number] Postoperative day 2 left total knee arthroplasty. Patient is making slow progress with therapy plan for discharge to home on Monday.

## 2015-01-05 NOTE — Evaluation (Signed)
Occupational Therapy Evaluation Patient Details Name: [name] MRN: [id number] DOB: September 27, 1941 Today's Date: 01/05/2015    History of Present Illness L TKR   Clinical Impression   This 74 year old female was admitted for the above surgery.  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  Pt verbalizes understanding of all education.  No further OT is needed at this time.    Follow Up Recommendations  No OT follow up    Equipment Recommendations  None recommended by OT    Recommendations for Other Services       Precautions / Restrictions Precautions Precautions: Knee;Fall Required Braces or Orthoses: Knee Immobilizer - Left Knee Immobilizer - Left: Discontinue once straight leg raise with < 10 degree lag Restrictions Other Position/Activity Restrictions: WBAT      Mobility Bed Mobility         Supine to sit: Min assist Sit to supine: Min guard   General bed mobility comments: min A with RLE under L; min guard with leg lifter/sheet  Transfers   Equipment used: Rolling walker (2 wheeled) Transfers: Sit to/from Stand Sit to Stand: Min assist         General transfer comment: light min A getting up without use of armrests, to simulate toilet at home    Balance                                            ADL Overall ADL's : Needs assistance/impaired                         Toilet Transfer: Minimal assistance;Stand-pivot             General ADL Comments: pt had just completed ADL.  Re-educated on sock aide, as she hasn't used this in awhile.  She is overall min A with LB adls and toilet transfers.  Simulated toilet transfer with use of RW and no arms to push up from:  pt needed light min A.  Pt's concerns were getting into a high bed and managing LLE.  Educated about using a single sturdy step stool vs. lower bed.  Practiced crossing RLE under L, using sheet and using leg lifter for managing leg into/out of bed.  Reviewed  no pillow under knee.  Pt feels comfortable with her tub bench.     Vision                     Perception     Praxis      Pertinent Vitals/Pain Pain Score: 5  (with weight bearing) Pain Location: L knee Pain Descriptors / Indicators: Aching Pain Intervention(s): Limited activity within patient's tolerance     Hand Dominance Right   Extremity/Trunk Assessment Upper Extremity Assessment Upper Extremity Assessment: Overall WFL for tasks assessed           Communication Communication Communication: No difficulties   Cognition Arousal/Alertness: Awake/alert Behavior During Therapy: WFL for tasks assessed/performed Overall Cognitive Status: Within Functional Limits for tasks assessed                     General Comments       Exercises       Shoulder Instructions      Home Living Family/patient expects to be discharged to:: Private residence Living Arrangements: Spouse/significant other  Bathroom Shower/Tub: Risk analyst characteristics: Architectural technologist: Standard     Home Equipment: Toilet riser;Tub bench;Adaptive equipment          Prior Functioning/Environment Level of Independence: Independent with assistive device(s)             OT Diagnosis: Generalized weakness   OT Problem List:     OT Treatment/Interventions:      OT Goals(Current goals can be found in the care plan section) Acute Rehab OT Goals Patient Stated Goal: Resume previous lifestyle with decreased pain  OT Frequency:     Barriers to D/C:            Co-evaluation              End of Session    Activity Tolerance: Patient limited by fatigue Patient left: in bed;with call bell/phone within reach   Time: 0808-0828 OT Time Calculation (min): 20 min Charges:  OT General Charges $OT Visit: 1 Procedure OT Evaluation $Initial OT Evaluation Tier I: 1 Procedure G-Codes:    Hailley Byers 01-25-15, 9:34  AM  Lesle Chris, OTR/L [phone] 2015/01/25

## 2015-01-05 NOTE — Plan of Care (Signed)
Problem: Phase III Progression Outcomes Goal: Anticoagulant follow-up in place Outcome: Not Applicable Date Met:  34/03/52 asa

## 2015-01-05 NOTE — Progress Notes (Signed)
Physical Therapy Treatment Patient Details Name: [name] MRN: [id number] DOB: 09-Oct-1941 Today's Date: 01/05/2015    History of Present Illness L TKR    PT Comments    POD # 2 pm session.  Assited pt out of BR to amb a great distance in hallway then back to bed per pt request to rest.  Pt progressing and plans to D/C to home with family support tomorrow.  Follow Up Recommendations  Home health PT     Equipment Recommendations  Rolling walker with 5" wheels    Recommendations for Other Services       Precautions / Restrictions Precautions Precautions: Knee;Fall Precaution Comments: pt instructed on KI use for amb and stairs Required Braces or Orthoses: Knee Immobilizer - Left Knee Immobilizer - Left: Discontinue once straight leg raise with < 10 degree lag Restrictions Weight Bearing Restrictions: No Other Position/Activity Restrictions: WBAT    Mobility  Bed Mobility Overal bed mobility: Needs Assistance Bed Mobility: Sit to Supine     Supine to sit: Min assist Sit to supine: Min assist   General bed mobility comments: Min assist to support L LE onto bed  Transfers Overall transfer level: Needs assistance Equipment used: Rolling walker (2 wheeled) Transfers: Sit to/from Stand Sit to Stand: Min guard         General transfer comment: 25% Vc's on proper tech and hand placement  Ambulation/Gait Ambulation/Gait assistance: Min guard Ambulation Distance (Feet): 45 Feet Assistive device: Rolling walker (2 wheeled) Gait Pattern/deviations: Step-to pattern;Decreased stance time - left;Trunk flexed Gait velocity: decreased   General Gait Details: Cues for posture, sequence and position from [employer]            Wheelchair Mobility    Modified Rankin (Stroke Patients Only)       Balance                                    Cognition                            Exercises      General Comments         Pertinent Vitals/Pain Pain Assessment: 0-10 Pain Score: 5  Pain Location: L knee Pain Descriptors / Indicators: Sore;Tender Pain Intervention(s): Monitored during session;Premedicated before session;Repositioned;Ice applied    Home Living                      Prior Function            PT Goals (current goals can now be found in the care plan section) Progress towards PT goals: Progressing toward goals    Frequency  7X/week    PT Plan      Co-evaluation             End of Session Equipment Utilized During Treatment: Gait belt;Left knee immobilizer Activity Tolerance: Patient tolerated treatment well Patient left: in chair;with call bell/phone within reach;with family/visitor present     Time: 1345-1402 PT Time Calculation (min) (ACUTE ONLY): 17 min  Charges:  $Gait Training: 8-22 mins                     G Codes:      Rica Koyanagi  PTA [facility]  Acute  Rehab Pager      [phone]

## 2015-01-05 NOTE — Clinical Social Work Note (Signed)
CSW reviewed pt chart which reflected PT evaluation recommending HH/PT  No further CSW needs  CSW signing off  .Dede Query, LCSW [facility] Clinical Social Worker - Weekend Coverage cell #: [phone]

## 2015-01-06 ENCOUNTER — Encounter (HOSPITAL_COMMUNITY): Payer: Self-pay | Admitting: Student

## 2015-01-06 LAB — CBC
HCT: 27.6 % — ABNORMAL LOW (ref 36.0–46.0)
HEMOGLOBIN: 9.3 g/dL — AB (ref 12.0–15.0)
MCH: 33.6 pg (ref 26.0–34.0)
MCHC: 33.7 g/dL (ref 30.0–36.0)
MCV: 99.6 fL (ref 78.0–100.0)
Platelets: 149 10*3/uL — ABNORMAL LOW (ref 150–400)
RBC: 2.77 MIL/uL — ABNORMAL LOW (ref 3.87–5.11)
RDW: 12.5 % (ref 11.5–15.5)
WBC: 9.5 10*3/uL (ref 4.0–10.5)

## 2015-01-06 MED ORDER — METHOCARBAMOL 500 MG PO TABS
500.0000 mg | ORAL_TABLET | Freq: Four times a day (QID) | ORAL | Status: DC | PRN
Start: 2015-01-06 — End: 2015-04-21

## 2015-01-06 MED ORDER — TRAMADOL HCL 50 MG PO TABS: 50.0000 mg | ORAL_TABLET | Freq: Four times a day (QID) | ORAL | Status: DC | PRN

## 2015-01-06 NOTE — Progress Notes (Signed)
Physical Therapy Treatment Patient Details Name: [name] MRN: [id number] DOB: May 06, 1941 Today's Date: 01/06/2015    History of Present Illness L TKR    PT Comments    POD # 3 pt feeling much better and eager to D/C to home with spouse today.  Assisted OOB and practiced stairs again with spouse.  Then returned to room to perform TKR TE's following HEP handout given.  Instructed on proper tech and freq as well as use of ICE after.   Follow Up Recommendations  Home health PT     Equipment Recommendations  Rolling walker with 5" wheels    Recommendations for Other Services       Precautions / Restrictions Precautions Precautions: Knee;Fall Precaution Comments: pt instructed on KI use for stairs for increased support Required Braces or Orthoses: Knee Immobilizer - Left Knee Immobilizer - Left: Discontinue once straight leg raise with < 10 degree lag Restrictions Weight Bearing Restrictions: No Other Position/Activity Restrictions: WBAT    Mobility  Bed Mobility Overal bed mobility: Needs Assistance Bed Mobility: Supine to Sit     Supine to sit: Min guard     General bed mobility comments: pt used opposite LE to assist L LE off bed with increased time  Transfers Overall transfer level: Needs assistance Equipment used: Rolling walker (2 wheeled) Transfers: Sit to/from Stand Sit to Stand: Supervision         General transfer comment: good safety cognition and use of hands to steady self  Ambulation/Gait Ambulation/Gait assistance: Supervision Ambulation Distance (Feet): 115 Feet Assistive device: Rolling walker (2 wheeled) Gait Pattern/deviations: Step-to pattern;Decreased stance time - left;Trunk flexed Gait velocity: decreased   General Gait Details: one VC on safety with turns   Stairs Stairs: Yes Stairs assistance: Min guard Stair Management: One rail Left;Step to pattern;Forwards Number of Stairs: 4 General stair comments: with spouse and  with KI.  Only one initial VC needed on safety.  performed well.  Wheelchair Mobility    Modified Rankin (Stroke Patients Only)       Balance                                    Cognition                            Exercises   Total Knee Replacement TE's 10 reps B LE ankle pumps 10 reps towel squeezes 10 reps knee presses 10 reps heel slides  10 reps SAQ's 10 reps SLR's 10 reps ABD Followed by ICE     General Comments        Pertinent Vitals/Pain Pain Assessment: 0-10 Pain Score: 3  Pain Location: L knee Pain Descriptors / Indicators: Tender;Sore Pain Intervention(s): Monitored during session;Premedicated before session;Repositioned;Ice applied    Home Living                      Prior Function            PT Goals (current goals can now be found in the care plan section) Progress towards PT goals: Progressing toward goals    Frequency  7X/week    PT Plan      Co-evaluation             End of Session Equipment Utilized During Treatment: Gait belt;Left knee immobilizer Activity Tolerance: Patient tolerated treatment well Patient  left: in chair;with call bell/phone within reach;with family/visitor present     Time: 1030-1110 PT Time Calculation (min) (ACUTE ONLY): 40 min  Charges:  $Gait Training: 8-22 mins $Therapeutic Exercise: 8-22 mins $Therapeutic Activity: 8-22 mins                    G Codes:      Rica Koyanagi  PTA [facility]  Acute  Rehab Pager      [phone]

## 2015-01-06 NOTE — Progress Notes (Signed)
Pt d/c home with [facility] home [facility]. RW delivered to room before d/c. Pt medicated for pain before d/c. Discussed prescriptions and knee precautions. Patient ready to be d/c home.

## 2015-01-06 NOTE — Progress Notes (Signed)
Subjective: 3 Days Post-Op Procedure(s) (LRB): LEFT TOTAL KNEE ARTHROPLASTY (Left) Patient reports pain as moderate.    Objective: Vital signs in last 24 hours: Temp:  [97.9 F (36.6 C)-99 F (37.2 C)] 97.9 F (36.6 C) (02/08 0517) Pulse Rate:  [71-86] 71 (02/08 0517) Resp:  [16-20] 20 (02/08 0517) BP: (114-150)/(44-72) 150/72 mmHg (02/08 0517) SpO2:  [96 %-100 %] 96 % (02/08 0517)  Intake/Output from previous day: 02/07 0701 - 02/08 0700 In: 840 [P.O.:840] Out: -  Intake/Output this shift:     Recent Labs  01/04/15 0515 01/05/15 0515 01/06/15 0510  HGB 9.4* 8.8* 9.3*    Recent Labs  01/05/15 0515 01/06/15 0510  WBC 7.7 9.5  RBC 2.59* 2.77*  HCT 25.7* 27.6*  PLT 134* 149*    Recent Labs  01/04/15 0515  NA 141  K 3.8  CL 102  CO2 32  BUN 9  CREATININE 0.50  GLUCOSE 104*  CALCIUM 8.6   No results for input(s): LABPT, INR in the last 72 hours.  Sensation intact distally Intact pulses distally Dorsiflexion/Plantar flexion intact Incision: dressing C/D/I Compartment soft  Assessment/Plan: 3 Days Post-Op Procedure(s) (LRB): LEFT TOTAL KNEE ARTHROPLASTY (Left) Discharge home with home health tod  Mcarthur Rossetti 01/06/2015, 7:18 AM

## 2015-01-06 NOTE — Discharge Summary (Signed)
Patient ID: [name] MRN: [id number] DOB/AGE: Jun 18, 1941 74 y.o.  Admit date: 01/03/2015 Discharge date: 01/06/2015  Admission Diagnoses:  Principal Problem:   Osteoarthritis of left knee Active Problems:   Status post total left knee replacement   Discharge Diagnoses:  Same  Past Medical History  Diagnosis Date  . Hypertension   . Dyslipidemia   . Hypothyroidism   . Arthritis     OSTEOARTHRITIS   -- CONSTANT PAIN RIGHT HIP---AND PAIN LEFT KNEE--PT STATES SHE GETS INJECTIONS INTO HER KNEE  . Ovarian cancer   . PAF (paroxysmal atrial fibrillation)     Only on Plavix -- not full Anticoagulation per pt. request  . Complication of anesthesia     BLOOD PRESSURE DROPPED WITH NASAL SURGERY, ONE OF THE CARPAL TUNNEL REPAIRS AND DURING A COLONOSCOPY  . History of skin cancer   . Constipation     Surgeries: Procedure(s): LEFT TOTAL KNEE ARTHROPLASTY on 01/03/2015   Consultants:    Discharged Condition: Improved  Hospital Course: [name] is an 74 y.o. female who was admitted 01/03/2015 for operative treatment ofOsteoarthritis of left knee. Patient has severe unremitting pain that affects sleep, daily activities, and work/hobbies. After pre-op clearance the patient was taken to the operating room on 01/03/2015 and underwent  Procedure(s): LEFT TOTAL KNEE ARTHROPLASTY.    Patient was given perioperative antibiotics: Anti-infectives    Start     Dose/Rate Route Frequency Ordered Stop   01/03/15 1600  ceFAZolin (ANCEF) IVPB 1 g/50 mL premix     1 g100 mL/hr over 30 Minutes Intravenous Every 6 hours 01/03/15 1123 01/03/15 2204   01/03/15 0530  ceFAZolin (ANCEF) IVPB 2 g/50 mL premix     2 g100 mL/hr over 30 Minutes Intravenous On call to O.R. 01/03/15 0530 01/03/15 0735       Patient was given sequential compression devices, early ambulation, and chemoprophylaxis to prevent DVT.  Patient benefited maximally from hospital stay and there were no complications.     Recent vital signs: Patient Vitals for the past 24 hrs:  BP Temp Temp src Pulse Resp SpO2  01/06/15 0517 (!) 150/72 mmHg 97.9 F (36.6 C) Oral 71 20 96 %  01/05/15 2140 (!) 114/44 mmHg 99 F (37.2 C) Oral 86 20 97 %  01/05/15 1600 - - - - 16 100 %  01/05/15 1422 (!) 120/58 mmHg 98.7 F (37.1 C) Oral 72 16 100 %  01/05/15 1200 - - - - 16 97 %  01/05/15 0800 - - - - 16 97 %     Recent laboratory studies:  Recent Labs  01/04/15 0515 01/05/15 0515 01/06/15 0510  WBC 8.0 7.7 9.5  HGB 9.4* 8.8* 9.3*  HCT 26.7* 25.7* 27.6*  PLT 112* 134* 149*  NA 141  --   --   K 3.8  --   --   CL 102  --   --   CO2 32  --   --   BUN 9  --   --   CREATININE 0.50  --   --   GLUCOSE 104*  --   --   CALCIUM 8.6  --   --      Discharge Medications:     Medication List    STOP taking these medications        acetaminophen 500 MG tablet  Commonly known as:  TYLENOL      TAKE these medications        aspirin EC 81  MG tablet  Take 81 mg by mouth every morning.     BENEFIBER Powd  Take 2 scoop by mouth every morning.     clopidogrel 75 MG tablet  Commonly known as:  PLAVIX  Take 1 tablet (75 mg total) by mouth at bedtime.     docusate sodium 100 MG capsule  Commonly known as:  COLACE  Take 100 mg by mouth 2 (two) times daily.     estradiol 1 MG tablet  Commonly known as:  ESTRACE  Take 1 tablet (1 mg total) by mouth every morning. PT STATES SHE WANTS ESTRACE--NOT THE GENERIC     GLUCOSAMINE PO  Take 1,000 mg by mouth every morning.     ICY HOT EX  Apply 1 application topically daily as needed (pain.).     levothyroxine 88 MCG tablet  Commonly known as:  SYNTHROID, LEVOTHROID  Take 88 mcg by mouth every morning. Takes on an empty stomach. PT STATES SHE NEEDS THE SYNTHROID--DOES NOT WANT THE GENERIC     methocarbamol 500 MG tablet  Commonly known as:  ROBAXIN  Take 1 tablet (500 mg total) by mouth every 6 (six) hours as needed for muscle spasms.     metoprolol tartrate  25 MG tablet  Commonly known as:  LOPRESSOR  Take 25 mg by mouth 2 (two) times daily.     multivitamin with minerals Tabs tablet  Take 1 tablet by mouth every morning.     polyethylene glycol packet  Commonly known as:  MIRALAX / GLYCOLAX  Take 17 g by mouth every morning.     potassium chloride 10 MEQ tablet  Commonly known as:  K-DUR  Take 10 mEq by mouth every Monday, Wednesday, and Friday.     propafenone 225 MG tablet  Commonly known as:  RYTHMOL  Take 1 tablet (225 mg total) by mouth 2 (two) times daily.     SYSTANE OP  Apply 1 drop to eye 2 (two) times daily.     traMADol 50 MG tablet  Commonly known as:  ULTRAM  Take 1-2 tablets (50-100 mg total) by mouth every 6 (six) hours as needed.        Diagnostic Studies: Dg Chest 2 View  12/30/2014   CLINICAL DATA:  Preop for left knee replacement  EXAM: CHEST  2 VIEW  COMPARISON:  05/05/2013  FINDINGS: Cardiomediastinal silhouette is stable. No acute infiltrate or pleural effusion. No pulmonary edema. Degenerative changes mid and lower thoracic spine.  IMPRESSION: No active cardiopulmonary disease. Degenerative changes thoracic spine.   Electronically Signed   By: Lahoma Crocker M.D.   On: 12/30/2014 13:14   Dg Knee Left Port  01/03/2015   CLINICAL DATA:  Status post total left knee replacement.  EXAM: PORTABLE LEFT KNEE - 1-2 VIEW  COMPARISON:  None.  FINDINGS: Status post left total knee arthroplasty. The femoral and tibial components appear to be well situated. Soft tissue gas is noted in suprapatellar region consistent with expected postoperative findings.  IMPRESSION: Status post left total knee arthroplasty.   Electronically Signed   By: Sabino Dick M.D.   On: 01/03/2015 10:08    Disposition: 01-Home or Self Care      Discharge Instructions    Discharge patient    Complete by:  As directed      Discharge wound care:    Complete by:  As directed   Keep dressing clean and intact. May shower with dressing intact left knee  Elevate operative extremity    Complete by:  As directed   Encourage patient to wiggle toes often     Weight bearing as tolerated    Complete by:  As directed   Laterality:  left  Extremity:  Lower           Follow-up Information    Follow up with [facility].   Why:  Home Health Physical Therapy   Contact information:   [employer] [address] [place] [address] [phone]       Follow up with Mcarthur Rossetti, MD In 2 weeks.   Specialty:  Orthopedic Surgery   Contact information:   [place] [address] [phone]        Signed: Mcarthur Rossetti 01/06/2015, 7:21 AM

## 2015-01-07 ENCOUNTER — Other Ambulatory Visit: Payer: Self-pay | Admitting: Cardiology

## 2015-01-07 NOTE — Telephone Encounter (Signed)
Rx(s) sent to pharmacy electronically.  

## 2015-01-12 ENCOUNTER — Telehealth: Payer: Self-pay | Admitting: Cardiology

## 2015-01-12 NOTE — Telephone Encounter (Signed)
Pt calling regarding vague sensation of "anxious" feeling in the chest; intermittently. Not currently there. Pulse is regular and controlled, occasional skipped beat she states. Doesn't feel like her heart goes fast, denies chest pain or pressure. No shortness of breath. S/p total knee, on plavix and aspirin for anticoag, on propafenone and metoprolol for rhythm control. She admits she is unsure how to describe the symptoms. No red flag symptoms but with caveat this is a telephone visit.  No urgent indication to seek emergent care but left option open to patient if symptoms change (SOB, dyspnea, chest pain). FYI to nursing for followup phone call in the AM.

## 2015-01-13 NOTE — Telephone Encounter (Signed)
Called pt in follow up from Dr. Jorge Ny.  Pt states she is feeling fine today.  She thinks the irregular rate is due to her taking Tramadol and having knee replacement.  Has had no further problems since last PM. Advised to call back if has any further problems.

## 2015-01-13 NOTE — Telephone Encounter (Signed)
Tried to call patient back. Phone was busy.

## 2015-01-15 ENCOUNTER — Telehealth: Payer: Self-pay | Admitting: Oncology

## 2015-01-15 NOTE — Telephone Encounter (Signed)
pt cld in to sch 44mth appt w/LL-gave pt time & date of sch appt-pt understood

## 2015-01-20 ENCOUNTER — Telehealth: Payer: Self-pay | Admitting: Cardiology

## 2015-01-20 NOTE — Telephone Encounter (Signed)
Dr. Allison Quarry recommendations given, pt voiced understanding.

## 2015-01-20 NOTE — Telephone Encounter (Signed)
Pt reports that for past 2 weeks (since knee surgery) she has been having occasional "flutters"/palpitations. Frequency of 2-3 times daily, duration ~10 seconds. Self-resolving.  She reports that these used to happen very occasionally but that she could not recall the last time she'd had them prior to this surgery.  She denies CP, SOB.  She states that [facility] checks BP and her numbers have been "normal". Pulse steady when checked by [facility].  Offered appt, she declined for now. I advised may be a medication change we can do, would defer to Dr. Ellyn Hack to give recommendation.

## 2015-01-20 NOTE — Telephone Encounter (Signed)
Pt called in stating that since her knee surgery on 2/5 she has been having a increase in heart "flutters" and she is concerned and would like to be advised on what to do. Please f/u  thanks

## 2015-01-20 NOTE — Telephone Encounter (Signed)
If Sx persist - would increase Metoprolol to 1 1/2 tab bid. As long as the episodes are short-lived, would not expect this to be recurrent Afib.  [place]

## 2015-01-27 ENCOUNTER — Ambulatory Visit: Payer: Medicare Other | Attending: Orthopaedic Surgery | Admitting: Physical Therapy

## 2015-01-27 ENCOUNTER — Encounter: Payer: Self-pay | Admitting: Physical Therapy

## 2015-01-27 DIAGNOSIS — Z96652 Presence of left artificial knee joint: Secondary | ICD-10-CM | POA: Insufficient documentation

## 2015-01-27 DIAGNOSIS — E785 Hyperlipidemia, unspecified: Secondary | ICD-10-CM | POA: Diagnosis not present

## 2015-01-27 DIAGNOSIS — I480 Paroxysmal atrial fibrillation: Secondary | ICD-10-CM | POA: Diagnosis not present

## 2015-01-27 DIAGNOSIS — R29898 Other symptoms and signs involving the musculoskeletal system: Secondary | ICD-10-CM | POA: Insufficient documentation

## 2015-01-27 DIAGNOSIS — R269 Unspecified abnormalities of gait and mobility: Secondary | ICD-10-CM | POA: Insufficient documentation

## 2015-01-27 DIAGNOSIS — Z471 Aftercare following joint replacement surgery: Secondary | ICD-10-CM | POA: Insufficient documentation

## 2015-01-27 DIAGNOSIS — I10 Essential (primary) hypertension: Secondary | ICD-10-CM | POA: Insufficient documentation

## 2015-01-27 DIAGNOSIS — Z7902 Long term (current) use of antithrombotics/antiplatelets: Secondary | ICD-10-CM | POA: Diagnosis not present

## 2015-01-27 DIAGNOSIS — C569 Malignant neoplasm of unspecified ovary: Secondary | ICD-10-CM | POA: Diagnosis not present

## 2015-01-27 DIAGNOSIS — M24662 Ankylosis, left knee: Secondary | ICD-10-CM

## 2015-01-27 DIAGNOSIS — E039 Hypothyroidism, unspecified: Secondary | ICD-10-CM | POA: Insufficient documentation

## 2015-01-27 NOTE — Therapy (Signed)
[facility] [address], [place], [place], [address] Phone: [phone]   Fax:  [phone]  Physical Therapy Evaluation  Patient Details  Name: [name] MRN: [id number] Date of Birth: 1941-04-14 Referring Provider:  Mcarthur Rossetti*  Encounter Date: 01/27/2015      PT End of Session - 01/27/15 1526    Visit Number 1   Date for PT Re-Evaluation 03/24/15   PT Start Time 0245   PT Stop Time 0326   PT Time Calculation (min) 41 min   Activity Tolerance Patient tolerated treatment well   Behavior During Therapy [facility] for tasks assessed/performed      Past Medical History  Diagnosis Date  . Hypertension   . Dyslipidemia   . Hypothyroidism   . Arthritis     OSTEOARTHRITIS   -- CONSTANT PAIN RIGHT HIP---AND PAIN LEFT KNEE--PT STATES SHE GETS INJECTIONS INTO HER KNEE  . Ovarian cancer   . PAF (paroxysmal atrial fibrillation)     Only on Plavix -- not full Anticoagulation per pt. request  . Complication of anesthesia     BLOOD PRESSURE DROPPED WITH NASAL SURGERY, ONE OF THE CARPAL TUNNEL REPAIRS AND DURING A COLONOSCOPY  . History of skin cancer   . Constipation     Past Surgical History  Procedure Laterality Date  . Bilateral carpal tunnel repair  2007  . Dilation and curettage of uterus  1969  . Surgery for ruptured ovarian cyst  1969  . Rhinoplasty for fractured nose  1986  . Left knee arthroscopy   2011  . Total hip arthroplasty  02/25/2012    Procedure: TOTAL HIP ARTHROPLASTY ANTERIOR APPROACH;  Surgeon: Mcarthur Rossetti, MD;  Location: [facility];  Service: Orthopedics;  Laterality: Right;  . Tonsillectomy  1962  . Laparotomy Bilateral 02/13/2013    Procedure: EXPLORATORY LAPAROTOMY TOTAL ABDOMINAL HYSTERECTOMY BILATERAL SALPINGO-OOPHORECTOMY, Partial Rectal Resection with Reanastamosis;  Surgeon: Alvino Chapel, MD;  Location: [facility];  Service: Gynecology;  Laterality: Bilateral;   . Omentectomy  02/13/2013    Procedure: OMENTECTOMY;  Surgeon: Alvino Chapel, MD;  Location: [facility];  Service: Gynecology;;  . Lymphadenectomy Right 02/13/2013    Procedure: PEVLIC  LYMPHADENECTOMY, DEBULKING right pelvic tumor nodules;  Surgeon: Alvino Chapel, MD;  Location: [facility];  Service: Gynecology;  Laterality: Right;  . Total abdominal hysterectomy  01/2013  . Transthoracic echocardiogram  May 2014    Normal LV size function. EF 60-65%. Grade 1 diastolic function. Mild MR and mildly elevated PA pressures of 37 mmHg per  . Nm myoview ltd  June 2010     subbmaximal with no ischemia or infarction.  . Total knee arthroplasty Left 01/03/2015    Procedure: LEFT TOTAL KNEE ARTHROPLASTY;  Surgeon: Mcarthur Rossetti, MD;  Location: [facility];  Service: Orthopedics;  Laterality: Left;    There were no vitals taken for this visit.  Visit Diagnosis:  Decreased range of motion of knee, left - Plan: PT plan of care cert/re-cert  Decreased strength involving knee joint - Plan: PT plan of care cert/re-cert  Gait abnormality - Plan: PT plan of care cert/re-cert      Subjective Assessment - 01/27/15 1454    Symptoms Decreased ROM and strength of left knee due to left TKR. Patient ambulates with small steps and single point cane.  Paitent has pain in left knee.    Pertinent History Ovarian cancer; Left total knee replacement   Limitations Walking;Standing;House hold  activities   How long can you stand comfortably? 10 minutes to help prepare the meal   How long can you walk comfortably? 15 minutes   Patient Stated Goals Walk normally   Currently in Pain? Yes   Pain Score 1    Pain Location Knee   Pain Orientation Left   Pain Descriptors / Indicators Aching   Pain Type Surgical pain  feels llike a bandage around her knee   Pain Onset 1 to 4 weeks ago   Pain Frequency Intermittent   Aggravating Factors  night time putting knee in a comfortable   Pain Relieving Factors icy  hot, Tylenol   Effect of Pain on Daily Activities not able to resume all her activities    Multiple Pain Sites No          [facility] PT Assessment - 01/27/15 0001    Assessment   Medical Diagnosis Left Total knee replacement   Onset Date 01/03/15   Next MD Visit 02/17/2015   Prior Therapy home physical therapy x 3 weeks   Precautions   Precautions Knee   Precaution Comments No ultrasound   Restrictions   Weight Bearing Restrictions No   Balance Screen   Has the patient fallen in the past 6 months No   Has the patient had a decrease in activity level because of a fear of falling?  No   Is the patient reluctant to leave their home because of a fear of falling?  No   Home Environment   Living Enviornment Private residence   Type of [place] One level   Prior Function   Level of Independence Independent with basic ADLs;Independent with homemaking with ambulation   Vocation Retired   Observation/Other Assessments   Skin Integrity sterri strips on sup. and inferior portion of the healed surgical site.   Decreased mobility of scar   Focus on Therapeutic Outcomes (FOTO)  68% limitation   AROM   AROM Assessment Site Knee   Right/Left Knee Left   Left Knee Extension --  -15 sitting, -12 supine   Left Knee Flexion 105   PROM   Left Knee Extension 112   Left Knee Flexion 114   Strength   Left Hip Flexion 5/5   Left Hip Extension 4/5   Left Hip ABduction 4-/5   Left Knee Flexion 4/5   Left Knee Extension 3+/5   Flexibility   Soft Tissue Assessment /Muscle Length yes  left gastroc is tight   Hamstrings --  tight   Quadriceps tight   Palpation   Palpation Tenderness located in posterior, anterior left knee, decreased left patella mobility   Ambulation/Gait   Ambulation/Gait Yes   Ambulation/Gait Assistance 7: Independent   Assistive device Straight cane   Gait Pattern Decreased step length - left;Decreased stride length;Decreased dorsiflexion - left    Ambulation Surface Level;Indoor;Outdoor;Paved   Gait velocity --  Patient walks slowly due to decreased mobility   Stairs Yes   Stairs Assistance --  step to step pattern and hold onto railing     Swelling noted in left knee from the Left total knee replacement surgery.                      PT Education - 01/27/15 1525    Education provided Yes   Education Details instructed patient on gait pattern.   Person(s) Educated Patient   Methods Explanation;Demonstration;Verbal cues   Comprehension Verbalized understanding;Returned demonstration;Verbal  cues required          PT Short Term Goals - January 31, 2015 1532    PT SHORT TERM GOAL #1   Title left knee flexion AROM >/= 115 degrees   Time 4   Period Weeks   Status New   PT SHORT TERM GOAL #2   Title left knee extension PROM >/= -8 degrees   Time 4   Period Weeks   Status New           PT Long Term Goals - 2015/01/31 1534    PT LONG TERM GOAL #1   Title understand how to use ice  for pain relief   Time 8   Period Weeks   Status New   PT LONG TERM GOAL #2   Title ambulate in the community with no assistive device and minimal gait deficits   Time 8   Period Weeks   Status New   PT LONG TERM GOAL #3   Title go up and down steps with step over step pattern and using a railing due to left knee AROM >/= 118 degrees   Time 8   Period Weeks   Status New   PT LONG TERM GOAL #4   Title return to her daily activities in the household due to left knee due to left knee strength >/=4+/5   Time 8   Period Weeks   Status New               Plan - Jan 31, 2015 1526    Clinical Impression Statement Patient is recovering from a left total knee replacement.  She had 3 weeks of home health physical therapy.  Patient has decreased left knee ROM and strength.  Decreased stride length and decreased heel strike on the left. Decreased left hip strength.    Pt will benefit from skilled therapeutic intervention in order to  improve on the following deficits Abnormal gait;Decreased range of motion;Difficulty walking;Impaired flexibility;Decreased endurance;Increased edema;Decreased activity tolerance;Decreased scar mobility;Pain;Decreased mobility;Decreased strength   Rehab Potential Good   Clinical Impairments Affecting Rehab Potential None   PT Frequency 2x / week   PT Duration 8 weeks   PT Treatment/Interventions Gait training;Moist Heat;Therapeutic activities;Patient/family education;Scar mobilization;Passive range of motion;Therapeutic exercise;Manual techniques;Neuromuscular re-education;Cryotherapy;Electrical Stimulation;Other (comment)  Vasopnuematic device   PT Next Visit Plan Recumbent bike, soft tissue work to left knee, ROM to left knee, mini trampoline, weight shifting exercise, gait training,    PT Home Exercise Plan knee range of motion and strengthening of left knee   Consulted and Agree with Plan of Care Patient          G-Codes - 2015-01-31 1538    Functional Assessment Tool Used FOTO   Functional Limitation Mobility: Walking and moving around   Mobility: Walking and Moving Around Current Status [phone]) At least 60 percent but less than 80 percent impaired, limited or restricted   Mobility: Walking and Moving Around Goal Status [phone]) At least 40 percent but less than 60 percent impaired, limited or restricted       Problem List Patient Active Problem List   Diagnosis Date Noted  . Osteoarthritis of left knee 01/03/2015  . Status post total left knee replacement 01/03/2015  . Preop cardiovascular exam 12/12/2014  . PAF (paroxysmal atrial fibrillation)   . Dyslipidemia   . Fever 05/03/2013    Class: Acute  . Nausea 05/03/2013    Class: Acute  . Constipation 05/03/2013    Class: Acute  . Syncope, history  of 04/19/2013  . History of atrial fibrillation 04/19/2013  . Essential hypertension 04/19/2013  . Hypothyroidism 04/19/2013  . Malignant neoplasm of ovary 03/11/2013  . Elevated  cancer antigen 125 (CA 125) 01/05/2013  . Degenerative arthritis of hip 02/25/2012    GRAY,CHERYL, PT 01/27/2015, 3:44 PM  [facility] [address], [place], [place], [address] Phone: [phone]   Fax:  [phone]

## 2015-01-27 NOTE — Patient Instructions (Signed)
Physical therapist instructed patient on ambulating with increased stride length and heel toe gait.  Patient verbally understands and is able to return demonstration correctly.

## 2015-01-31 ENCOUNTER — Other Ambulatory Visit (HOSPITAL_BASED_OUTPATIENT_CLINIC_OR_DEPARTMENT_OTHER): Payer: Medicare Other

## 2015-01-31 DIAGNOSIS — C569 Malignant neoplasm of unspecified ovary: Secondary | ICD-10-CM

## 2015-01-31 DIAGNOSIS — C561 Malignant neoplasm of right ovary: Secondary | ICD-10-CM

## 2015-01-31 DIAGNOSIS — E039 Hypothyroidism, unspecified: Secondary | ICD-10-CM

## 2015-01-31 DIAGNOSIS — C562 Malignant neoplasm of left ovary: Secondary | ICD-10-CM

## 2015-02-01 LAB — CA 125: CA 125: 26 U/mL (ref <35)

## 2015-02-03 ENCOUNTER — Other Ambulatory Visit: Payer: Self-pay | Admitting: Cardiology

## 2015-02-03 ENCOUNTER — Ambulatory Visit: Payer: Self-pay | Admitting: Physical Therapy

## 2015-02-03 ENCOUNTER — Ambulatory Visit: Payer: Medicare Other | Attending: Orthopaedic Surgery | Admitting: Physical Therapy

## 2015-02-03 ENCOUNTER — Encounter: Payer: Self-pay | Admitting: Physical Therapy

## 2015-02-03 DIAGNOSIS — R29898 Other symptoms and signs involving the musculoskeletal system: Secondary | ICD-10-CM | POA: Diagnosis not present

## 2015-02-03 DIAGNOSIS — E039 Hypothyroidism, unspecified: Secondary | ICD-10-CM | POA: Insufficient documentation

## 2015-02-03 DIAGNOSIS — Z7902 Long term (current) use of antithrombotics/antiplatelets: Secondary | ICD-10-CM | POA: Diagnosis not present

## 2015-02-03 DIAGNOSIS — I480 Paroxysmal atrial fibrillation: Secondary | ICD-10-CM | POA: Diagnosis not present

## 2015-02-03 DIAGNOSIS — Z471 Aftercare following joint replacement surgery: Secondary | ICD-10-CM | POA: Insufficient documentation

## 2015-02-03 DIAGNOSIS — C569 Malignant neoplasm of unspecified ovary: Secondary | ICD-10-CM | POA: Insufficient documentation

## 2015-02-03 DIAGNOSIS — E785 Hyperlipidemia, unspecified: Secondary | ICD-10-CM | POA: Insufficient documentation

## 2015-02-03 DIAGNOSIS — Z96652 Presence of left artificial knee joint: Secondary | ICD-10-CM | POA: Diagnosis not present

## 2015-02-03 DIAGNOSIS — M24662 Ankylosis, left knee: Secondary | ICD-10-CM

## 2015-02-03 DIAGNOSIS — I10 Essential (primary) hypertension: Secondary | ICD-10-CM | POA: Diagnosis not present

## 2015-02-03 DIAGNOSIS — R269 Unspecified abnormalities of gait and mobility: Secondary | ICD-10-CM

## 2015-02-03 NOTE — Therapy (Signed)
[facility] [address], [place], [place], [address] Phone: [phone]   Fax:  [phone]  Physical Therapy Treatment  Patient Details  Name: [name] MRN: [id number] Date of Birth: Jul 17, 1941 Referring Provider:  Mcarthur Rossetti*  Encounter Date: 02/03/2015      PT End of Session - 02/03/15 1417    Visit Number 2   Date for PT Re-Evaluation 03/24/15   PT Start Time 1400   PT Stop Time 1500   PT Time Calculation (min) 60 min   Activity Tolerance Patient tolerated treatment well   Behavior During Therapy [facility] for tasks assessed/performed      Past Medical History  Diagnosis Date  . Hypertension   . Dyslipidemia   . Hypothyroidism   . Arthritis     OSTEOARTHRITIS   -- CONSTANT PAIN RIGHT HIP---AND PAIN LEFT KNEE--PT STATES SHE GETS INJECTIONS INTO HER KNEE  . Ovarian cancer   . PAF (paroxysmal atrial fibrillation)     Only on Plavix -- not full Anticoagulation per pt. request  . Complication of anesthesia     BLOOD PRESSURE DROPPED WITH NASAL SURGERY, ONE OF THE CARPAL TUNNEL REPAIRS AND DURING A COLONOSCOPY  . History of skin cancer   . Constipation     Past Surgical History  Procedure Laterality Date  . Bilateral carpal tunnel repair  2007  . Dilation and curettage of uterus  1969  . Surgery for ruptured ovarian cyst  1969  . Rhinoplasty for fractured nose  1986  . Left knee arthroscopy   2011  . Total hip arthroplasty  02/25/2012    Procedure: TOTAL HIP ARTHROPLASTY ANTERIOR APPROACH;  Surgeon: Mcarthur Rossetti, MD;  Location: [facility];  Service: Orthopedics;  Laterality: Right;  . Tonsillectomy  1962  . Laparotomy Bilateral 02/13/2013    Procedure: EXPLORATORY LAPAROTOMY TOTAL ABDOMINAL HYSTERECTOMY BILATERAL SALPINGO-OOPHORECTOMY, Partial Rectal Resection with Reanastamosis;  Surgeon: Alvino Chapel, MD;  [facility]: [facility];  Service: Gynecology;  Laterality: Bilateral;  .  Omentectomy  02/13/2013    Procedure: OMENTECTOMY;  Surgeon: Alvino Chapel, MD;  [facility];  Service: Gynecology;;  . Lymphadenectomy Right 02/13/2013    Procedure: PEVLIC  LYMPHADENECTOMY, DEBULKING right pelvic tumor nodules;  Surgeon: Alvino Chapel, MD;  [facility];  Service: Gynecology;  Laterality: Right;  . Total abdominal hysterectomy  01/2013  . Transthoracic echocardiogram  May 2014    Normal LV size function. EF 60-65%. Grade 1 diastolic function. Mild MR and mildly elevated PA pressures of 37 mmHg per  . Nm myoview ltd  June 2010     subbmaximal with no ischemia or infarction.  . Total knee arthroplasty Left 01/03/2015    Procedure: LEFT TOTAL KNEE ARTHROPLASTY;  Surgeon: Mcarthur Rossetti, MD;  [facility];  Service: Orthopedics;  Laterality: Left;    There were no vitals taken for this visit.  Visit Diagnosis:  Decreased range of motion of knee, left  Decreased strength involving knee joint  Gait abnormality      Subjective Assessment - 02/03/15 1411    Symptoms Patient reports right knee has more pain in the past week. Patient reports her left knee feels wobbly.    Pertinent History Ovarian cancer; Left total knee replacement   Limitations Walking;Standing;House hold activities   How long can you stand comfortably? 15 minutes   How long can you walk comfortably? 20 minutes walking slowly in the grocery store   Patient  Stated Goals Walk normally   Currently in Pain? Yes   Pain Score 3    Pain Location Knee   Pain Orientation Left   Pain Descriptors / Indicators Aching   Pain Onset 1 to 4 weeks ago   Pain Frequency Intermittent   Aggravating Factors  bending   Pain Relieving Factors icy hot, tylenol   Effect of Pain on Daily Activities not able to resume all of her activities.   Multiple Pain Sites Yes   Pain Score 6   Pain Type Acute pain   Pain Location Knee  bone spur under right knee cap   Pain Orientation Right    Pain Descriptors / Indicators Sharp   Pain Frequency Intermittent   Pain Onset Sudden                    [facility] Adult PT Treatment/Exercise - 02/03/15 0001    Exercises   Exercises Knee/Hip   Knee/Hip Exercises: Stretches   Gastroc Stretch 3 reps;30 seconds;Other (comment)   Gastroc Stretch Limitations used prostretch   Knee/Hip Exercises: Aerobic   Stationary Bike Level 0 back and forth 8 min.   Knee/Hip Exercises: Standing   Forward Step Up Left;15 reps;Hand Hold: 1;Step Height: 4"  verbal cues to straighten left knee   Other Standing Knee Exercises Mini tramp 3 ways 1 min. each   Knee/Hip Exercises: Seated   Long Arc Quad AROM;Strengthening;Left;2 sets;15 reps   Long Arc Quad Weight 4 lbs.   Long [employer] Limitations not able to fully straighten her knee   Modalities   Modalities Cryotherapy   Cryotherapy   Number Minutes Cryotherapy 15 Minutes   Cryotherapy Location Knee  left elevated   Type of Cryotherapy Other (comment)  vasopnuematic device   Manual Therapy   Manual Therapy Massage   Massage soft tissue work to left knee, hamstring, quads   Passive ROM PROM to left knee for flexion and extension                PT Education - 02/03/15 1439    Education provided No          PT Short Term Goals - 02/03/15 1440    PT SHORT TERM GOAL #1   Title left knee flexion AROM >/= 115 degrees   Time 4   Status On-going  working on increasing ROM   PT SHORT TERM GOAL #2   Title left knee extension PROM >/= -8 degrees   Time 4   Period Weeks   Status On-going  -10           PT Long Term Goals - 02/03/15 1441    PT LONG TERM GOAL #1   Title understand how to use ice  for pain relief   Time 8   Period Weeks   Status New   PT LONG TERM GOAL #2   Title ambulate in the community with no assistive device and minimal gait deficits   Time 8   Period Weeks   Status On-going  just started therapy   PT LONG TERM GOAL #3   Title go up and down  steps with step over step pattern and using a railing due to left knee AROM >/= 118 degrees   Time 8   Period Weeks   Status On-going  just started therapy   PT LONG TERM GOAL #4   Title return to her daily activities in the household due to left knee due to  left knee strength >/=4+/5   Time 8   Status On-going  just started therapy               Plan - 02/03/15 1439    Clinical Impression Statement Patient has tight quads and hamstring, decreased left knee range of motion, decreased left knee strength, be careful of right knee due to pain   Pt will benefit from skilled therapeutic intervention in order to improve on the following deficits Abnormal gait;Decreased range of motion;Difficulty walking;Impaired flexibility;Decreased endurance;Increased edema;Decreased activity tolerance;Decreased scar mobility;Pain;Decreased mobility;Decreased strength   Rehab Potential Good   Clinical Impairments Affecting Rehab Potential None   PT Frequency 2x / week   PT Duration 8 weeks   PT Treatment/Interventions Gait training;Moist Heat;Therapeutic activities;Patient/family education;Scar mobilization;Passive range of motion;Therapeutic exercise;Manual techniques;Neuromuscular re-education;Cryotherapy;Electrical Stimulation;Other (comment)   PT Next Visit Plan stretch left hamstring and gastroc and quads, review HEP and progress   PT Home Exercise Plan knee range of motion and strengthening of left knee   Consulted and Agree with Plan of Care Patient        Problem List Patient Active Problem List   Diagnosis Date Noted  . Osteoarthritis of left knee 01/03/2015  . Status post total left knee replacement 01/03/2015  . Preop cardiovascular exam 12/12/2014  . PAF (paroxysmal atrial fibrillation)   . Dyslipidemia   . Fever 05/03/2013    Class: Acute  . Nausea 05/03/2013    Class: Acute  . Constipation 05/03/2013    Class: Acute  . Syncope, history of 04/19/2013  . History of atrial  fibrillation 04/19/2013  . Essential hypertension 04/19/2013  . Hypothyroidism 04/19/2013  . Malignant neoplasm of ovary 03/11/2013  . Elevated cancer antigen 125 (CA 125) 01/05/2013  . Degenerative arthritis of hip 02/25/2012    Ahlaya Ende, PT 02/03/2015, 3:51 PM  [facility] [address], [place], [place], [address] Phone: [phone]   Fax:  [phone]

## 2015-02-03 NOTE — Telephone Encounter (Signed)
Rx(s) sent to pharmacy electronically.  

## 2015-02-07 ENCOUNTER — Encounter: Payer: Self-pay | Admitting: Gynecology

## 2015-02-07 ENCOUNTER — Ambulatory Visit: Payer: Medicare Other | Attending: Gynecology | Admitting: Gynecology

## 2015-02-07 VITALS — BP 150/64 | HR 70 | Temp 98.0°F | Resp 18 | Ht 63.0 in | Wt 132.0 lb

## 2015-02-07 DIAGNOSIS — Z7982 Long term (current) use of aspirin: Secondary | ICD-10-CM | POA: Diagnosis not present

## 2015-02-07 DIAGNOSIS — Z7902 Long term (current) use of antithrombotics/antiplatelets: Secondary | ICD-10-CM | POA: Insufficient documentation

## 2015-02-07 DIAGNOSIS — E039 Hypothyroidism, unspecified: Secondary | ICD-10-CM | POA: Diagnosis not present

## 2015-02-07 DIAGNOSIS — Z885 Allergy status to narcotic agent status: Secondary | ICD-10-CM | POA: Insufficient documentation

## 2015-02-07 DIAGNOSIS — Z79899 Other long term (current) drug therapy: Secondary | ICD-10-CM | POA: Insufficient documentation

## 2015-02-07 DIAGNOSIS — C561 Malignant neoplasm of right ovary: Secondary | ICD-10-CM | POA: Insufficient documentation

## 2015-02-07 DIAGNOSIS — Z888 Allergy status to other drugs, medicaments and biological substances status: Secondary | ICD-10-CM | POA: Insufficient documentation

## 2015-02-07 DIAGNOSIS — Z85828 Personal history of other malignant neoplasm of skin: Secondary | ICD-10-CM | POA: Insufficient documentation

## 2015-02-07 DIAGNOSIS — Z9221 Personal history of antineoplastic chemotherapy: Secondary | ICD-10-CM

## 2015-02-07 DIAGNOSIS — E785 Hyperlipidemia, unspecified: Secondary | ICD-10-CM | POA: Diagnosis not present

## 2015-02-07 DIAGNOSIS — R971 Elevated cancer antigen 125 [CA 125]: Secondary | ICD-10-CM

## 2015-02-07 DIAGNOSIS — I10 Essential (primary) hypertension: Secondary | ICD-10-CM | POA: Insufficient documentation

## 2015-02-07 DIAGNOSIS — I480 Paroxysmal atrial fibrillation: Secondary | ICD-10-CM | POA: Insufficient documentation

## 2015-02-07 DIAGNOSIS — Z8543 Personal history of malignant neoplasm of ovary: Secondary | ICD-10-CM

## 2015-02-07 DIAGNOSIS — C569 Malignant neoplasm of unspecified ovary: Secondary | ICD-10-CM

## 2015-02-07 NOTE — Progress Notes (Signed)
Consult Note: Gyn-Onc   [name] 74 y.o. female  Chief Complaint  Patient presents with  . ovarian cancer, right    Assessment and plan : Stage IIb poorly differentiated ovarian cancer. Clinically free of disease. However, the rising CA-125 is of some concern. It is certainly possible that is slightly more elevated because she recently had a total knee replacement. However I would like to repeated an approximate 6 weeks. If the CA-125 continues to rise I would investigate further with a PET CT scan. Interval History:   The patient returns today as previously scheduled.  Since her last visit she's done well although she had a left total knee replacement recently (5 weeks ago) she is still undergoing rehabilitation seems to be doing well. She specifically denies any GI or GU symptoms functional status is otherwise very good.  She had a CA-125 on 01/31/2015 which was 26 units per mL. (Previously was 20 units per mL and 18 units per mL)  HPI:The patient initially presented with a pelvic mass and elevated CA 125 (178 units per mL) She underwent exploratory laparotomy and debulking on 02/13/2013. Final pathology showed a poorly differentiated ovarian cancer involving both ovaries pelvic peritoneum and rectal muscularis. All gross disease was resected.  She then received 6 cycles of carboplatin and Taxol chemotherapy completed in September 2014. At the completion of chemotherapy her CA 125 was 18 units per mL.   Review of Systems:10 point review of systems is negative except as noted in interval history.   Vitals: Blood pressure 150/64, pulse 70, temperature 98 F (36.7 C), temperature source Oral, resp. rate 18, height 5\' 3"  (1.6 m), weight 132 lb (59.875 kg).  Physical Exam: General : The patient is a healthy woman in no acute distress.  HEENT: normocephalic, extraoccular movements normal; neck is supple without thyromegally  Lynphnodes: Supraclavicular and inguinal nodes not enlarged   Abdomen: Soft, non-tender, no ascites, no organomegally, no masses, no hernias , midline incision is healing well  Pelvic:    EGBUS: Normal female  Vagina bladder urethra: Normal  Vaginal cuff is well healed.  Cervix and uterus are surgically absent  Bimanual exam: No masses nodularity or fullness.  Rectovaginal exam confirms      Lower extremities: No edema or varicosities. Normal range of motion      Allergies  Allergen Reactions  . Codeine Other (See Comments)    Does not like the feeling she gets  . Lisinopril     LIP NUMBNESS  . Tape Rash    Past Medical History  Diagnosis Date  . Hypertension   . Dyslipidemia   . Hypothyroidism   . Arthritis     OSTEOARTHRITIS   -- CONSTANT PAIN RIGHT HIP---AND PAIN LEFT KNEE--PT STATES SHE GETS INJECTIONS INTO HER KNEE  . Ovarian cancer   . PAF (paroxysmal atrial fibrillation)     Only on Plavix -- not full Anticoagulation per pt. request  . Complication of anesthesia     BLOOD PRESSURE DROPPED WITH NASAL SURGERY, ONE OF THE CARPAL TUNNEL REPAIRS AND DURING A COLONOSCOPY  . History of skin cancer   . Constipation     Past Surgical History  Procedure Laterality Date  . Bilateral carpal tunnel repair  2007  . Dilation and curettage of uterus  1969  . Surgery for ruptured ovarian cyst  1969  . Rhinoplasty for fractured nose  1986  . Left knee arthroscopy   2011  . Total hip arthroplasty  02/25/2012  Procedure: TOTAL HIP ARTHROPLASTY ANTERIOR APPROACH;  Surgeon: Mcarthur Rossetti, MD;  [facility];  Service: Orthopedics;  Laterality: Right;  . Tonsillectomy  1962  . Laparotomy Bilateral 02/13/2013    Procedure: EXPLORATORY LAPAROTOMY TOTAL ABDOMINAL HYSTERECTOMY BILATERAL SALPINGO-OOPHORECTOMY, Partial Rectal Resection with Reanastamosis;  Surgeon: Alvino Chapel, MD;  [facility];  Service: Gynecology;  Laterality: Bilateral;  . Omentectomy  02/13/2013    Procedure: OMENTECTOMY;  Surgeon: Alvino Chapel, MD;  [facility];  Service: Gynecology;;  . Lymphadenectomy Right 02/13/2013    Procedure: PEVLIC  LYMPHADENECTOMY, DEBULKING right pelvic tumor nodules;  Surgeon: Alvino Chapel, MD;  [facility];  Service: Gynecology;  Laterality: Right;  . Total abdominal hysterectomy  01/2013  . Transthoracic echocardiogram  May 2014    Normal LV size function. EF 60-65%. Grade 1 diastolic function. Mild MR and mildly elevated PA pressures of 37 mmHg per  . Nm myoview ltd  June 2010     subbmaximal with no ischemia or infarction.  . Total knee arthroplasty Left 01/03/2015    Procedure: LEFT TOTAL KNEE ARTHROPLASTY;  Surgeon: Mcarthur Rossetti, MD;  [facility];  Service: Orthopedics;  Laterality: Left;    Current Outpatient Prescriptions  Medication Sig Dispense Refill  . aspirin EC 81 MG tablet Take 81 mg by mouth every morning.     . clopidogrel (PLAVIX) 75 MG tablet Take 1 tablet (75 mg total) by mouth at bedtime. 90 tablet 2  . desonide (DESOWEN) 0.05 % cream Apply 1 application topically 2 (two) times daily.   1  . docusate sodium (COLACE) 100 MG capsule Take 100 mg by mouth 2 (two) times daily.    Marland Kitchen estradiol (ESTRACE) 1 MG tablet Take 1 tablet (1 mg total) by mouth every morning. PT STATES SHE WANTS ESTRACE--NOT THE GENERIC 90 tablet 3  . Glucosamine HCl (GLUCOSAMINE PO) Take 1,000 mg by mouth every morning.     Marland Kitchen levothyroxine (SYNTHROID, LEVOTHROID) 88 MCG tablet Take 88 mcg by mouth every morning. Takes on an empty stomach. PT STATES SHE NEEDS THE SYNTHROID--DOES NOT WANT THE GENERIC    . Menthol, Topical Analgesic, (ICY HOT EX) Apply 1 application topically daily as needed (pain.).    Marland Kitchen metoprolol tartrate (LOPRESSOR) 25 MG tablet Take 1 tablet (25 mg total) by mouth 2 (two) times daily. 60 tablet 10  . Multiple Vitamin (MULTIVITAMIN WITH MINERALS) TABS tablet Take 1 tablet by mouth every morning.     Vladimir Faster Glycol-Propyl Glycol (SYSTANE OP) Apply  1 drop to eye 2 (two) times daily.    . polyethylene glycol (MIRALAX / GLYCOLAX) packet Take 17 g by mouth every morning.     . potassium chloride (K-DUR) 10 MEQ tablet Take 10 mEq by mouth every Monday, Wednesday, and Friday.    . Wheat Dextrin (BENEFIBER) POWD Take 2 scoop by mouth every morning.     . methocarbamol (ROBAXIN) 500 MG tablet Take 1 tablet (500 mg total) by mouth every 6 (six) hours as needed for muscle spasms. (Patient not taking: Reported on 02/07/2015) 60 tablet 0  . propafenone (RYTHMOL) 225 MG tablet Take 1 tablet (225 mg total) by mouth 2 (two) times daily. 180 tablet 3   No current facility-administered medications for this visit.    History   Social History  . Marital Status: Married    Spouse Name: N/A  . Number of Children: N/A  . Years of Education: N/A   Occupational History  .  Not on file.   Social History Main Topics  . Smoking status: Never Smoker   . Smokeless tobacco: Never Used  . Alcohol Use: No  . Drug Use: No  . Sexual Activity: Not Currently   Other Topics Concern  . Not on file   Social History Narrative   Married mother of 3, grandmother 26.   Exercises 6/7 days a week walking 30 minutes a time.   Never smoked or drank alcohol.    Family History  Problem Relation Age of Onset  . Hypertension Mother       Alvino Chapel, MD 02/07/2015, 10:53 AM

## 2015-02-07 NOTE — Patient Instructions (Signed)
Plan for repeat CA 125 on April 15.  We will contact you with the results.  Follow up will be based on the results.

## 2015-02-10 ENCOUNTER — Ambulatory Visit: Payer: Medicare Other | Admitting: Physical Therapy

## 2015-02-10 ENCOUNTER — Encounter: Payer: Self-pay | Admitting: Physical Therapy

## 2015-02-10 DIAGNOSIS — R29898 Other symptoms and signs involving the musculoskeletal system: Secondary | ICD-10-CM

## 2015-02-10 DIAGNOSIS — Z471 Aftercare following joint replacement surgery: Secondary | ICD-10-CM | POA: Diagnosis not present

## 2015-02-10 DIAGNOSIS — M24662 Ankylosis, left knee: Secondary | ICD-10-CM

## 2015-02-10 DIAGNOSIS — R269 Unspecified abnormalities of gait and mobility: Secondary | ICD-10-CM

## 2015-02-10 NOTE — Therapy (Signed)
[facility] [address] [place], [place], [address] Phone: [phone]   Fax:  [phone]  Physical Therapy Treatment  Patient Details  Name: [name] MRN: [id number] Date of Birth: 1941-03-13 Referring Provider:  Mcarthur Rossetti*  Encounter Date: 02/10/2015      PT End of Session - 02/10/15 1248    Visit Number 3   Number of Visits 10  medicare   Date for PT Re-Evaluation 03/24/15   PT Start Time 1230   PT Stop Time 1325   PT Time Calculation (min) 55 min   Activity Tolerance Patient tolerated treatment well   Behavior During Therapy [facility] for tasks assessed/performed      Past Medical History  Diagnosis Date  . Hypertension   . Dyslipidemia   . Hypothyroidism   . Arthritis     OSTEOARTHRITIS   -- CONSTANT PAIN RIGHT HIP---AND PAIN LEFT KNEE--PT STATES SHE GETS INJECTIONS INTO HER KNEE  . Ovarian cancer   . PAF (paroxysmal atrial fibrillation)     Only on Plavix -- not full Anticoagulation per pt. request  . Complication of anesthesia     BLOOD PRESSURE DROPPED WITH NASAL SURGERY, ONE OF THE CARPAL TUNNEL REPAIRS AND DURING A COLONOSCOPY  . History of skin cancer   . Constipation     Past Surgical History  Procedure Laterality Date  . Bilateral carpal tunnel repair  2007  . Dilation and curettage of uterus  1969  . Surgery for ruptured ovarian cyst  1969  . Rhinoplasty for fractured nose  1986  . Left knee arthroscopy   2011  . Total hip arthroplasty  02/25/2012    Procedure: TOTAL HIP ARTHROPLASTY ANTERIOR APPROACH;  Surgeon: Mcarthur Rossetti, MD;  [facility];  Service: Orthopedics;  Laterality: Right;  . Tonsillectomy  1962  . Laparotomy Bilateral 02/13/2013    Procedure: EXPLORATORY LAPAROTOMY TOTAL ABDOMINAL HYSTERECTOMY BILATERAL SALPINGO-OOPHORECTOMY, Partial Rectal Resection with Reanastamosis;  Surgeon: Alvino Chapel, MD;  [facility];  Service:  Gynecology;  Laterality: Bilateral;  . Omentectomy  02/13/2013    Procedure: OMENTECTOMY;  Surgeon: Alvino Chapel, MD;  [facility];  Service: Gynecology;;  . Lymphadenectomy Right 02/13/2013    Procedure: PEVLIC  LYMPHADENECTOMY, DEBULKING right pelvic tumor nodules;  Surgeon: Alvino Chapel, MD;  [facility];  Service: Gynecology;  Laterality: Right;  . Total abdominal hysterectomy  01/2013  . Transthoracic echocardiogram  May 2014    Normal LV size function. EF 60-65%. Grade 1 diastolic function. Mild MR and mildly elevated PA pressures of 37 mmHg per  . Nm myoview ltd  June 2010     subbmaximal with no ischemia or infarction.  . Total knee arthroplasty Left 01/03/2015    Procedure: LEFT TOTAL KNEE ARTHROPLASTY;  Surgeon: Mcarthur Rossetti, MD;  [facility];  Service: Orthopedics;  Laterality: Left;    There were no vitals filed for this visit.  Visit Diagnosis:  Decreased range of motion of knee, left  Decreased strength involving knee joint  Gait abnormality      Subjective Assessment - 02/10/15 1238    Symptoms I have done alot this morning and my left knee is tired.  I had to stand alot. I hear clicking in the left knee when she is walking. Patient is using her left leg more with steps. Pain in upper thigh decreased by 50%.   Pertinent History Ovarian cancer; Left total knee replacement  Limitations Walking;Standing;House hold activities   How long can you stand comfortably? stood for 45 min. with no increase in pain but felt achy and tired.    How long can you walk comfortably? 20 minutes walking slowly in the grocery store   Patient Stated Goals Walk normally   Currently in Pain? Yes   Pain Score 3    Pain Location Knee   Pain Orientation Left   Pain Descriptors / Indicators Tightness;Aching   Pain Type Surgical pain;Chronic pain  when wake pt. has tightness in lt. knee   Pain Onset 1 to 4 weeks ago   Pain Frequency Intermittent    Aggravating Factors  bending   Pain Relieving Factors icy hot, tylenol   Effect of Pain on Daily Activities not able to resume all of her activities   Multiple Pain Sites No            [facility] PT Assessment - 02/10/15 0001    PROM   Left Knee Extension -5   Left Knee Flexion 115                   [facility] Adult PT Treatment/Exercise - 02/10/15 0001    Knee/Hip Exercises: Aerobic   Stationary Bike level 0 x 8 min.   Knee/Hip Exercises: Standing   Lateral Step Up Left;20 reps;Hand Hold: 2;Step Height: 6"   Forward Step Up Left;20 reps;Hand Hold: 2;Step Height: 6"   Other Standing Knee Exercises Mini tramp 3 ways 1 min. each   Cryotherapy   Number Minutes Cryotherapy 15 Minutes   Cryotherapy Location Knee  left elevated   Type of Cryotherapy Other (comment)  vasopnuematic elevated left knee   Manual Therapy   Manual Therapy Massage;Passive ROM   Massage soft tissue work to left knee, hip adductors, quads, hamstring, gastroc, scar massage, patella mobilization   Passive ROM PROM to left knee for flexion and extension                PT Education - 02/10/15 1313    Education provided No          PT Short Term Goals - 02/10/15 1317    PT SHORT TERM GOAL #1   Title left knee flexion AROM >/= 115 degrees   Time 4   Period Weeks   Status On-going  PROM is 115   PT SHORT TERM GOAL #2   Title left knee extension PROM >/= -8 degrees   Time 4   Period Weeks   Status Achieved           PT Long Term Goals - 02/10/15 1318    PT LONG TERM GOAL #1   Title understand how to use ice  for pain relief   Time 8   Period Weeks   Status Achieved   PT LONG TERM GOAL #2   Title ambulate in the community with no assistive device and minimal gait deficits   Time 8   Period Weeks   Status On-going  uses a cane with small steps   PT LONG TERM GOAL #3   Title go up and down steps with step over step pattern and using a railing due to left knee AROM >/= 118  degrees   Time 8   Period Weeks   Status On-going  step to step patter   PT LONG TERM GOAL #4   Title return to her daily activities in the household due to left knee due to left knee  strength >/=4+/5   Time 8   Period Weeks   Status On-going  working on [employer] - 02/10/15 1314    Clinical Impression Statement Patient has increased tightness in left hip adductors, improved left knee extension and flexion,    Pt will benefit from skilled therapeutic intervention in order to improve on the following deficits Decreased range of motion;Difficulty walking;Impaired flexibility;Pain;Increased muscle spasms;Decreased scar mobility;Decreased mobility;Decreased strength;Increased fascial restricitons;Decreased endurance;Increased edema;Abnormal gait   Rehab Potential Good   Clinical Impairments Affecting Rehab Potential none   PT Frequency 1x / week   PT Duration 8 weeks   PT Treatment/Interventions Gait training;Moist Heat;Therapeutic activities;Patient/family education;Scar mobilization;Passive range of motion;Therapeutic exercise;Manual techniques;Neuromuscular re-education;Cryotherapy;Electrical Stimulation;Other (comment)   PT Next Visit Plan left knee strengthening, ROM exercise, soft tissue work   PT Home Exercise Plan HEP- hamstring, gastroc, and quads stretch, step up and lateral step up   Recommended Other Services None   Consulted and Agree with Plan of Care Patient        Problem List Patient Active Problem List   Diagnosis Date Noted  . Osteoarthritis of left knee 01/03/2015  . Status post total left knee replacement 01/03/2015  . Preop cardiovascular exam 12/12/2014  . PAF (paroxysmal atrial fibrillation)   . Dyslipidemia   . Fever 05/03/2013    Class: Acute  . Nausea 05/03/2013    Class: Acute  . Constipation 05/03/2013    Class: Acute  . Syncope, history of 04/19/2013  . History of atrial fibrillation 04/19/2013  . Essential hypertension  04/19/2013  . Hypothyroidism 04/19/2013  . Malignant neoplasm of ovary 03/11/2013  . Elevated cancer antigen 125 (CA 125) 01/05/2013  . Degenerative arthritis of hip 02/25/2012    Lakiesha Ralphs, PT 02/10/2015, 1:20 PM  [facility] [address] [place], [place], [address] Phone: [phone]   Fax:  [phone]

## 2015-02-12 ENCOUNTER — Telehealth: Payer: Self-pay | Admitting: Cardiology

## 2015-02-12 NOTE — Telephone Encounter (Signed)
We had advised pt to increase metoprolol after she experienced short-lived palpitations. She feels certain it correlated w/ surgical pain and/or tramadol use post-surgery as this was her only med change. Was having fatigue since increasing dose of metoprolol.  Pt wants to go back to dose of metoprolol she took pre-surgery. Advised her if she wanted to cut back to original dose of metoprolol this should be fine. Will route to Dr. Ellyn Hack for additional recommendations.

## 2015-02-12 NOTE — Telephone Encounter (Signed)
Pt had a knee replacement on 01-02-15. She was given Tramadol,it caused her tor have atrial fib. She was told by a nurse here to increase her Metoprolol,it did help. She no longer take Tramadol,but she is so so tired. Wonder if she can cut back on her Metoprolol?

## 2015-02-13 NOTE — Telephone Encounter (Signed)
That is fine  DH 

## 2015-02-14 ENCOUNTER — Ambulatory Visit: Payer: Medicare Other | Admitting: Physical Therapy

## 2015-02-14 ENCOUNTER — Encounter: Payer: Self-pay | Admitting: Physical Therapy

## 2015-02-14 DIAGNOSIS — M24662 Ankylosis, left knee: Secondary | ICD-10-CM

## 2015-02-14 DIAGNOSIS — Z471 Aftercare following joint replacement surgery: Secondary | ICD-10-CM | POA: Diagnosis not present

## 2015-02-14 DIAGNOSIS — R29898 Other symptoms and signs involving the musculoskeletal system: Secondary | ICD-10-CM

## 2015-02-14 NOTE — Therapy (Signed)
[facility] [address] [place], [place], [address] Phone: [phone]   Fax:  [phone]  Physical Therapy Treatment  Patient Details  Name: [name] MRN: [id number] Date of Birth: 1941-02-18 Referring Provider:  Mcarthur Rossetti*  Encounter Date: 02/14/2015      PT End of Session - 02/14/15 1052    Visit Number 4   Date for PT Re-Evaluation 03/24/15   PT Start Time 1010   PT Stop Time 1105   PT Time Calculation (min) 55 min   Activity Tolerance Patient tolerated treatment well   Behavior During Therapy [facility] for tasks assessed/performed      Past Medical History  Diagnosis Date  . Hypertension   . Dyslipidemia   . Hypothyroidism   . Arthritis     OSTEOARTHRITIS   -- CONSTANT PAIN RIGHT HIP---AND PAIN LEFT KNEE--PT STATES SHE GETS INJECTIONS INTO HER KNEE  . Ovarian cancer   . PAF (paroxysmal atrial fibrillation)     Only on Plavix -- not full Anticoagulation per pt. request  . Complication of anesthesia     BLOOD PRESSURE DROPPED WITH NASAL SURGERY, ONE OF THE CARPAL TUNNEL REPAIRS AND DURING A COLONOSCOPY  . History of skin cancer   . Constipation     Past Surgical History  Procedure Laterality Date  . Bilateral carpal tunnel repair  2007  . Dilation and curettage of uterus  1969  . Surgery for ruptured ovarian cyst  1969  . Rhinoplasty for fractured nose  1986  . Left knee arthroscopy   2011  . Total hip arthroplasty  02/25/2012    Procedure: TOTAL HIP ARTHROPLASTY ANTERIOR APPROACH;  Surgeon: Mcarthur Rossetti, MD;  [facility]: [facility];  Service: Orthopedics;  Laterality: Right;  . Tonsillectomy  1962  . Laparotomy Bilateral 02/13/2013    Procedure: EXPLORATORY LAPAROTOMY TOTAL ABDOMINAL HYSTERECTOMY BILATERAL SALPINGO-OOPHORECTOMY, Partial Rectal Resection with Reanastamosis;  Surgeon: Alvino Chapel, MD;  [facility];  Service: Gynecology;  Laterality: Bilateral;   . Omentectomy  02/13/2013    Procedure: OMENTECTOMY;  Surgeon: Alvino Chapel, MD;  [facility];  Service: Gynecology;;  . Lymphadenectomy Right 02/13/2013    Procedure: PEVLIC  LYMPHADENECTOMY, DEBULKING right pelvic tumor nodules;  Surgeon: Alvino Chapel, MD;  [facility];  Service: Gynecology;  Laterality: Right;  . Total abdominal hysterectomy  01/2013  . Transthoracic echocardiogram  May 2014    Normal LV size function. EF 60-65%. Grade 1 diastolic function. Mild MR and mildly elevated PA pressures of 37 mmHg per  . Nm myoview ltd  June 2010     subbmaximal with no ischemia or infarction.  . Total knee arthroplasty Left 01/03/2015    Procedure: LEFT TOTAL KNEE ARTHROPLASTY;  Surgeon: Mcarthur Rossetti, MD;  [facility];  Service: Orthopedics;  Laterality: Left;    There were no vitals filed for this visit.  Visit Diagnosis:  Decreased range of motion of knee, left  Decreased strength involving knee joint      Subjective Assessment - 02/14/15 1016    Symptoms I am doing well. Knee just feeling stiff.   Currently in Pain? Yes   Pain Score 2    Pain Location Knee   Pain Descriptors / Indicators Aching   Pain Type Surgical pain   Aggravating Factors  Standing or walking too long   Pain Relieving Factors Rest, ice, meds   Multiple Pain Sites No  [facility] PT Assessment - 02/14/15 0001    AROM   Overall AROM  --  4- 120 degrees active in supine                   [facility] Adult PT Treatment/Exercise - 02/14/15 0001    Knee/Hip Exercises: Aerobic   Stationary Bike L0 x10   Able to make full revolution half way through   Knee/Hip Exercises: Standing   Other Standing Knee Exercises Mini tramp 3 ways 1 min. each   Cryotherapy   Number Minutes Cryotherapy 15 Minutes   Cryotherapy Location --  Knee   Type of Cryotherapy --  Gameready 3 flakes medium                PT Education - 02/14/15 1031    Education  provided Yes   Education Details HEP for stretching: gastroc, hamstrings   Person(s) Educated Patient   Methods Explanation;Demonstration;Handout   Comprehension Verbalized understanding;Returned demonstration          PT Short Term Goals - 02/14/15 1055    PT SHORT TERM GOAL #1   Title left knee flexion AROM >/= 115 degrees   Time 4   Period Weeks   Status Achieved  120 degrees   PT SHORT TERM GOAL #2   Title left knee extension PROM >/= -8 degrees   Time 4   Period Weeks   Status Achieved  5 degrees           PT Long Term Goals - 02/10/15 1318    PT LONG TERM GOAL #1   Title understand how to use ice  for pain relief   Time 8   Period Weeks   Status Achieved   PT LONG TERM GOAL #2   Title ambulate in the community with no assistive device and minimal gait deficits   Time 8   Period Weeks   Status On-going  uses a cane with small steps   PT LONG TERM GOAL #3   Title go up and down steps with step over step pattern and using a railing due to left knee AROM >/= 118 degrees   Time 8   Period Weeks   Status On-going  step to step patter   PT LONG TERM GOAL #4   Title return to her daily activities in the household due to left knee due to left knee strength >/=4+/5   Time 8   Period Weeks   Status On-going  working on [employer] - 02/14/15 1053    Clinical Impression Statement Patient's AROM has improved, weekly she is able to do more functional activity. Steps still difficult.   Pt will benefit from skilled therapeutic intervention in order to improve on the following deficits Decreased range of motion;Difficulty walking;Impaired flexibility;Pain;Increased muscle spasms;Decreased scar mobility;Decreased mobility;Decreased strength;Increased fascial restricitons;Decreased endurance;Increased edema;Abnormal gait   Clinical Impairments Affecting Rehab Potential none   PT Frequency 1x / week   PT Duration 8 weeks   PT  Treatment/Interventions Gait training;Moist Heat;Therapeutic activities;Patient/family education;Scar mobilization;Passive range of motion;Therapeutic exercise;Manual techniques;Neuromuscular re-education;Cryotherapy;Electrical Stimulation;Other (comment)   PT Next Visit Plan left knee strengthening, ROM exercise, soft tissue work   Consulted and Agree with Plan of Care Patient        Problem List Patient Active Problem List   Diagnosis Date Noted  . Osteoarthritis of left knee 01/03/2015  . Status post total left  knee replacement 01/03/2015  . Preop cardiovascular exam 12/12/2014  . PAF (paroxysmal atrial fibrillation)   . Dyslipidemia   . Fever 05/03/2013    Class: Acute  . Nausea 05/03/2013    Class: Acute  . Constipation 05/03/2013    Class: Acute  . Syncope, history of 04/19/2013  . History of atrial fibrillation 04/19/2013  . Essential hypertension 04/19/2013  . Hypothyroidism 04/19/2013  . Malignant neoplasm of ovary 03/11/2013  . Elevated cancer antigen 125 (CA 125) 01/05/2013  . Degenerative arthritis of hip 02/25/2012    Shadow Stiggers, PTA 02/14/2015, 10:58 AM  [facility] [address] [place], [place], [address] Phone: [phone]   Fax:  [phone]

## 2015-02-14 NOTE — Patient Instructions (Addendum)
Leg Extension (Hamstring)   Sit toward front edge of chair, with leg out straight, heel on floor, toes pointing toward body. Keeping back straight, bend forward at hip, breathing out through pursed lips. Return, breathing in. Repeat ___3 times. Repeat with other leg. Do _2__ sessions per day. Hold relaxed 10-20 seconds .  Marland Kitchen   Sitting, place strap around foot. Pull foot toward body, keeping heel on floor. Keep foot straight. Hold _30__ seconds. _3__ reps per set, __3_ sets per day, _7__ days per week  Achilles / Gastroc, Standing   Stand, right foot behind, heel on floor and turned slightly out, leg straight, forward leg bent. Move hips forward. Hold _30__ seconds. Repeat _3__ times per session. Do _3__ sessions per day.     Stand with right foot back, both knees bent. Keeping heel on floor, turned slightly out, lean into wall until stretch is felt in lower calf. Hold _30___ seconds. Repeat __3__ times per set. Do __3__ sessions per day.  http://orth.exer.us/665   .  Dorsiflexion: Self-Mobilization (Sitting) .  http://orth.exer.us/82   .Calf Stretch   Stand with hands supported on wall, elbows slightly bent, front knee bent, back knee straight, feet parallel and both heels on floor. Lean into wall by pushing hips forward until a stretch is felt in calf muscle. Hold __10-20__ seconds. Repeat with leg positions switched. Do 3x   Copyright  VHI. All rights reserved.

## 2015-02-18 ENCOUNTER — Ambulatory Visit: Payer: Medicare Other | Admitting: Physical Therapy

## 2015-02-18 ENCOUNTER — Encounter: Payer: Self-pay | Admitting: Physical Therapy

## 2015-02-18 DIAGNOSIS — Z471 Aftercare following joint replacement surgery: Secondary | ICD-10-CM | POA: Diagnosis not present

## 2015-02-18 DIAGNOSIS — M24662 Ankylosis, left knee: Secondary | ICD-10-CM

## 2015-02-18 DIAGNOSIS — R269 Unspecified abnormalities of gait and mobility: Secondary | ICD-10-CM

## 2015-02-18 DIAGNOSIS — R29898 Other symptoms and signs involving the musculoskeletal system: Secondary | ICD-10-CM

## 2015-02-18 NOTE — Therapy (Signed)
[facility] [address], [place], [place], [address] Phone: [phone]   Fax:  [phone]  Physical Therapy Treatment  Patient Details  Name: [name] MRN: [id number] Date of Birth: 27-Feb-1941 Referring Provider:  Mcarthur Rossetti*  Encounter Date: 02/18/2015      PT End of Session - 02/18/15 1231    Visit Number 5   Number of Visits 10   Date for PT Re-Evaluation 03/24/15   PT Start Time 3818   PT Stop Time 1245   PT Time Calculation (min) 60 min   Activity Tolerance Patient tolerated treatment well   Behavior During Therapy [facility] for tasks assessed/performed      Past Medical History  Diagnosis Date  . Hypertension   . Dyslipidemia   . Hypothyroidism   . Arthritis     OSTEOARTHRITIS   -- CONSTANT PAIN RIGHT HIP---AND PAIN LEFT KNEE--PT STATES SHE GETS INJECTIONS INTO HER KNEE  . Ovarian cancer   . PAF (paroxysmal atrial fibrillation)     Only on Plavix -- not full Anticoagulation per pt. request  . Complication of anesthesia     BLOOD PRESSURE DROPPED WITH NASAL SURGERY, ONE OF THE CARPAL TUNNEL REPAIRS AND DURING A COLONOSCOPY  . History of skin cancer   . Constipation     Past Surgical History  Procedure Laterality Date  . Bilateral carpal tunnel repair  2007  . Dilation and curettage of uterus  1969  . Surgery for ruptured ovarian cyst  1969  . Rhinoplasty for fractured nose  1986  . Left knee arthroscopy   2011  . Total hip arthroplasty  02/25/2012    Procedure: TOTAL HIP ARTHROPLASTY ANTERIOR APPROACH;  Surgeon: Mcarthur Rossetti, MD;  [facility];  Service: Orthopedics;  Laterality: Right;  . Tonsillectomy  1962  . Laparotomy Bilateral 02/13/2013    Procedure: EXPLORATORY LAPAROTOMY TOTAL ABDOMINAL HYSTERECTOMY BILATERAL SALPINGO-OOPHORECTOMY, Partial Rectal Resection with Reanastamosis;  Surgeon: Alvino Chapel, MD;  [facility];  Service: Gynecology;   Laterality: Bilateral;  . Omentectomy  02/13/2013    Procedure: OMENTECTOMY;  Surgeon: Alvino Chapel, MD;  [facility];  Service: Gynecology;;  . Lymphadenectomy Right 02/13/2013    Procedure: PEVLIC  LYMPHADENECTOMY, DEBULKING right pelvic tumor nodules;  Surgeon: Alvino Chapel, MD;  [facility];  Service: Gynecology;  Laterality: Right;  . Total abdominal hysterectomy  01/2013  . Transthoracic echocardiogram  May 2014    Normal LV size function. EF 60-65%. Grade 1 diastolic function. Mild MR and mildly elevated PA pressures of 37 mmHg per  . Nm myoview ltd  June 2010     subbmaximal with no ischemia or infarction.  . Total knee arthroplasty Left 01/03/2015    Procedure: LEFT TOTAL KNEE ARTHROPLASTY;  Surgeon: Mcarthur Rossetti, MD;  [facility];  Service: Orthopedics;  Laterality: Left;    There were no vitals filed for this visit.  Visit Diagnosis:  Decreased range of motion of knee, left  Decreased strength involving knee joint  Gait abnormality      Subjective Assessment - 02/18/15 1200    Symptoms I saw the doctor and he is happy with my progress.  When I ride the bike I have pain in the left front of my hip.    Pertinent History Ovarian cancer; Left total knee replacement   Limitations Walking;Standing;House hold activities   How long can you stand comfortably? stood for 45 min. with no  increase in pain but felt achy and tired.    How long can you walk comfortably? 20 minutes walking slowly in the grocery store   Patient Stated Goals Walk normally   Currently in Pain? Yes   Pain Score 2    Pain Location Knee  left hip   Pain Orientation Left   Pain Descriptors / Indicators Aching   Pain Type Surgical pain   Pain Onset 1 to 4 weeks ago   Pain Frequency Intermittent   Aggravating Factors  standing and walking   Pain Relieving Factors rest, ice   Effect of Pain on Daily Activities not abel to resume all of her activities   Multiple  Pain Sites No            [facility] PT Assessment - 02/18/15 0001    AROM   Left Knee Extension -8   Left Knee Flexion 114   PROM   Left Knee Extension -5   Left Knee Flexion 120                   [facility] Adult PT Treatment/Exercise - 02/18/15 0001    Ambulation/Gait   Ambulation/Gait Assistance 7: Independent   Gait Pattern --  verbal cues for increased hip ext. and heel toe bil.    Stairs Yes   Stair Management Technique Two rails;Alternating pattern  step over step with 2 railsx10   Number of Stairs 6   Knee/Hip Exercises: Aerobic   Stationary Bike L0 7 min   Knee/Hip Exercises: Standing   Gait Training with VC for increased hip ext and heel toe with CG   Other Standing Knee Exercises marching on mini tramp x 1 min.  VC for high march   Cryotherapy   Number Minutes Cryotherapy 15 Minutes   Cryotherapy Location Knee   Type of Cryotherapy Other (comment)   Manual Therapy   Manual Therapy Massage  MET to correct post. left ilium   Massage soft tisse work to left quads, hamsting and left knee   Passive ROM PROM to left knee to increase ROM                PT Education - 02/18/15 1230    Education provided No          PT Short Term Goals - 02/14/15 1055    PT SHORT TERM GOAL #1   Title left knee flexion AROM >/= 115 degrees   Time 4   Period Weeks   Status Achieved  120 degrees   PT SHORT TERM GOAL #2   Title left knee extension PROM >/= -8 degrees   Time 4   Period Weeks   Status Achieved  5 degrees           PT Long Term Goals - 02/18/15 1235    PT LONG TERM GOAL #2   Title ambulate in the community with no assistive device and minimal gait deficits   Time 8   Period Weeks   Status On-going  using the cane less   PT LONG TERM GOAL #3   Title go up and down steps with step over step pattern and using a railing due to left knee AROM >/= 118 degrees   Time 8   Period Weeks   Status On-going  flexion 115 degrees   PT LONG TERM  GOAL #4   Title return to her daily activities in the household due to left knee due to left knee strength >/=4+/5  Time 8   Period Weeks   Status On-going               Plan - 02/18/15 1237    Clinical Impression Statement Patient has increased ROM of left knee.  Patient had tight quads and hamstring limiting knee ROM.  Patients pelvis was off contributing to anterior left hip pain.    Pt will benefit from skilled therapeutic intervention in order to improve on the following deficits Decreased range of motion;Difficulty walking;Impaired flexibility;Pain;Increased muscle spasms;Decreased scar mobility;Decreased mobility;Decreased strength;Increased fascial restricitons;Decreased endurance;Increased edema;Abnormal gait   Rehab Potential Good   Clinical Impairments Affecting Rehab Potential none   PT Frequency 2x / week   PT Duration 8 weeks   PT Treatment/Interventions Gait training;Moist Heat;Therapeutic activities;Patient/family education;Scar mobilization;Passive range of motion;Therapeutic exercise;Manual techniques;Neuromuscular re-education;Cryotherapy;Electrical Stimulation;Other (comment)   PT Next Visit Plan left knee strengthening, ROM exercise, soft tissue work   PT Home Exercise Plan HEP- hamstring, gastroc, and quads stretch, step up and lateral step up   Consulted and Agree with Plan of Care Patient        Problem List Patient Active Problem List   Diagnosis Date Noted  . Osteoarthritis of left knee 01/03/2015  . Status post total left knee replacement 01/03/2015  . Preop cardiovascular exam 12/12/2014  . PAF (paroxysmal atrial fibrillation)   . Dyslipidemia   . Fever 05/03/2013    Class: Acute  . Nausea 05/03/2013    Class: Acute  . Constipation 05/03/2013    Class: Acute  . Syncope, history of 04/19/2013  . History of atrial fibrillation 04/19/2013  . Essential hypertension 04/19/2013  . Hypothyroidism 04/19/2013  . Malignant neoplasm of ovary  03/11/2013  . Elevated cancer antigen 125 (CA 125) 01/05/2013  . Degenerative arthritis of hip 02/25/2012    GRAY,CHERYL,PT 02/18/2015, 12:39 PM  [facility] [address] [place], [place], [address] Phone: [phone]   Fax:  [phone]

## 2015-02-24 ENCOUNTER — Encounter: Payer: Self-pay | Admitting: Physical Therapy

## 2015-02-24 ENCOUNTER — Ambulatory Visit: Payer: Medicare Other | Admitting: Physical Therapy

## 2015-02-24 DIAGNOSIS — Z471 Aftercare following joint replacement surgery: Secondary | ICD-10-CM | POA: Diagnosis not present

## 2015-02-24 DIAGNOSIS — R609 Edema, unspecified: Secondary | ICD-10-CM

## 2015-02-24 DIAGNOSIS — M24662 Ankylosis, left knee: Secondary | ICD-10-CM

## 2015-02-24 DIAGNOSIS — R29898 Other symptoms and signs involving the musculoskeletal system: Secondary | ICD-10-CM

## 2015-02-24 NOTE — Therapy (Signed)
[facility] [address] [place], [place], [address] Phone: [phone]   Fax:  [phone]  Physical Therapy Treatment  Patient Details  Name: [name] MRN: [id number] Date of Birth: May 24, 1941 Referring Provider:  Shon Baton, MD  Encounter Date: 02/24/2015      PT End of Session - 02/24/15 1307    Visit Number 6   Number of Visits 10   Date for PT Re-Evaluation 03/24/15   PT Start Time 6962   PT Stop Time 1318   PT Time Calculation (min) 48 min   Activity Tolerance Patient tolerated treatment well   Behavior During Therapy [facility] for tasks assessed/performed      Past Medical History  Diagnosis Date  . Hypertension   . Dyslipidemia   . Hypothyroidism   . Arthritis     OSTEOARTHRITIS   -- CONSTANT PAIN RIGHT HIP---AND PAIN LEFT KNEE--PT STATES SHE GETS INJECTIONS INTO HER KNEE  . Ovarian cancer   . PAF (paroxysmal atrial fibrillation)     Only on Plavix -- not full Anticoagulation per pt. request  . Complication of anesthesia     BLOOD PRESSURE DROPPED WITH NASAL SURGERY, ONE OF THE CARPAL TUNNEL REPAIRS AND DURING A COLONOSCOPY  . History of skin cancer   . Constipation     Past Surgical History  Procedure Laterality Date  . Bilateral carpal tunnel repair  2007  . Dilation and curettage of uterus  1969  . Surgery for ruptured ovarian cyst  1969  . Rhinoplasty for fractured nose  1986  . Left knee arthroscopy   2011  . Total hip arthroplasty  02/25/2012    Procedure: TOTAL HIP ARTHROPLASTY ANTERIOR APPROACH;  Surgeon: Mcarthur Rossetti, MD;  [facility];  Service: Orthopedics;  Laterality: Right;  . Tonsillectomy  1962  . Laparotomy Bilateral 02/13/2013    Procedure: EXPLORATORY LAPAROTOMY TOTAL ABDOMINAL HYSTERECTOMY BILATERAL SALPINGO-OOPHORECTOMY, Partial Rectal Resection with Reanastamosis;  Surgeon: Alvino Chapel, MD;  [facility];  Service: Gynecology;  Laterality:  Bilateral;  . Omentectomy  02/13/2013    Procedure: OMENTECTOMY;  Surgeon: Alvino Chapel, MD;  [facility];  Service: Gynecology;;  . Lymphadenectomy Right 02/13/2013    Procedure: PEVLIC  LYMPHADENECTOMY, DEBULKING right pelvic tumor nodules;  Surgeon: Alvino Chapel, MD;  [facility];  Service: Gynecology;  Laterality: Right;  . Total abdominal hysterectomy  01/2013  . Transthoracic echocardiogram  May 2014    Normal LV size function. EF 60-65%. Grade 1 diastolic function. Mild MR and mildly elevated PA pressures of 37 mmHg per  . Nm myoview ltd  June 2010     subbmaximal with no ischemia or infarction.  . Total knee arthroplasty Left 01/03/2015    Procedure: LEFT TOTAL KNEE ARTHROPLASTY;  Surgeon: Mcarthur Rossetti, MD;  [facility];  Service: Orthopedics;  Laterality: Left;    There were no vitals filed for this visit.  Visit Diagnosis:  Decreased range of motion of knee, left  Decreased strength involving knee joint  Edema      Subjective Assessment - 02/24/15 1231    Symptoms Tied shoes independently this weekend. She is aware of her kneecap now due to swelling going down. Very minimal use of cane.    Currently in Pain? No/denies   Multiple Pain Sites No                       [facility] Adult  PT Treatment/Exercise - 02/24/15 0001    Knee/Hip Exercises: Aerobic   Stationary Bike L1 x 10 min   Knee/Hip Exercises: Standing   Rocker Board --  3 x 10 second holds for calf/knee   Other Standing Knee Exercises marching on mini tramp x 1 min.  VC for high march   Cryotherapy   Number Minutes Cryotherapy 15 Minutes   Cryotherapy Location Knee   Type of Cryotherapy --  Gameready 3 flakes medium   Manual Therapy   Massage Soft tissue work post knee, pt could not get pants high enough for quads                  PT Short Term Goals - 02/24/15 1235    PT SHORT TERM GOAL #1   Title left knee flexion AROM >/= 115 degrees    Time 4   Period Weeks   Status Achieved   PT SHORT TERM GOAL #2   Title left knee extension PROM >/= -8 degrees   Time 4   Period Weeks   Status Achieved           PT Long Term Goals - 02/24/15 1235    PT LONG TERM GOAL #1   Title understand how to use ice  for pain relief   Time 8   Period Weeks   Status Achieved   PT LONG TERM GOAL #2   Title ambulate in the community with no assistive device and minimal gait deficits   Time 8   Period Weeks   Status On-going  Minimal use of cane   PT LONG TERM GOAL #4   Title return to her daily activities in the household due to left knee due to left knee strength >/=4+/5   Time 8   Period Weeks   Status On-going               Plan - 02/24/15 1309    Clinical Impression Statement Patient does not like to lay leg out straight for very long. Continues with no pain, can put sneakers on independently now.   Pt will benefit from skilled therapeutic intervention in order to improve on the following deficits Decreased range of motion;Difficulty walking;Impaired flexibility;Pain;Increased muscle spasms;Decreased scar mobility;Decreased mobility;Decreased strength;Increased fascial restricitons;Decreased endurance;Increased edema;Abnormal gait   Rehab Potential Good   PT Frequency 2x / week   PT Duration 8 weeks   PT Treatment/Interventions Gait training;Moist Heat;Therapeutic activities;Patient/family education;Scar mobilization;Passive range of motion;Therapeutic exercise;Manual techniques;Neuromuscular re-education;Cryotherapy;Electrical Stimulation;Other (comment)   PT Next Visit Plan left knee strengthening, ROM exercise, soft tissue work, add step ups to HEP   Consulted and Agree with Plan of Care Patient        Problem List Patient Active Problem List   Diagnosis Date Noted  . Osteoarthritis of left knee 01/03/2015  . Status post total left knee replacement 01/03/2015  . Preop cardiovascular exam 12/12/2014  . PAF  (paroxysmal atrial fibrillation)   . Dyslipidemia   . Fever 05/03/2013    Class: Acute  . Nausea 05/03/2013    Class: Acute  . Constipation 05/03/2013    Class: Acute  . Syncope, history of 04/19/2013  . History of atrial fibrillation 04/19/2013  . Essential hypertension 04/19/2013  . Hypothyroidism 04/19/2013  . Malignant neoplasm of ovary 03/11/2013  . Elevated cancer antigen 125 (CA 125) 01/05/2013  . Degenerative arthritis of hip 02/25/2012    COCHRAN,JENNIFER, PTA 02/24/2015, 1:12 PM  [facility] [address], [place], [place], [address] Phone: [phone]   Fax:  [phone]

## 2015-02-25 ENCOUNTER — Other Ambulatory Visit: Payer: Self-pay | Admitting: Cardiology

## 2015-02-25 MED ORDER — METOPROLOL TARTRATE 25 MG PO TABS: 37.5000 mg | ORAL_TABLET | Freq: Two times a day (BID) | ORAL | Status: DC

## 2015-02-25 NOTE — Telephone Encounter (Signed)
°  1. Which medications need to be refilled? Metoprolol 25 mg (1 1/2 tab bid(new dosage) )  2. Which pharmacy is medication to be sent to?[facility] on [address]  3. Do they need a 30 day or 90 day supply? 90  4. Would they like a call back once the medication has been sent to the pharmacy? no

## 2015-02-25 NOTE — Telephone Encounter (Signed)
Refill submitted. 

## 2015-02-28 ENCOUNTER — Ambulatory Visit: Payer: Medicare Other | Attending: Orthopaedic Surgery | Admitting: Physical Therapy

## 2015-02-28 ENCOUNTER — Encounter: Payer: Self-pay | Admitting: Physical Therapy

## 2015-02-28 DIAGNOSIS — I480 Paroxysmal atrial fibrillation: Secondary | ICD-10-CM | POA: Insufficient documentation

## 2015-02-28 DIAGNOSIS — Z96652 Presence of left artificial knee joint: Secondary | ICD-10-CM | POA: Diagnosis not present

## 2015-02-28 DIAGNOSIS — E785 Hyperlipidemia, unspecified: Secondary | ICD-10-CM | POA: Diagnosis not present

## 2015-02-28 DIAGNOSIS — E039 Hypothyroidism, unspecified: Secondary | ICD-10-CM | POA: Diagnosis not present

## 2015-02-28 DIAGNOSIS — R29898 Other symptoms and signs involving the musculoskeletal system: Secondary | ICD-10-CM

## 2015-02-28 DIAGNOSIS — R269 Unspecified abnormalities of gait and mobility: Secondary | ICD-10-CM | POA: Insufficient documentation

## 2015-02-28 DIAGNOSIS — Z471 Aftercare following joint replacement surgery: Secondary | ICD-10-CM | POA: Insufficient documentation

## 2015-02-28 DIAGNOSIS — Z7902 Long term (current) use of antithrombotics/antiplatelets: Secondary | ICD-10-CM | POA: Diagnosis not present

## 2015-02-28 DIAGNOSIS — M24662 Ankylosis, left knee: Secondary | ICD-10-CM

## 2015-02-28 DIAGNOSIS — I10 Essential (primary) hypertension: Secondary | ICD-10-CM | POA: Diagnosis not present

## 2015-02-28 DIAGNOSIS — C569 Malignant neoplasm of unspecified ovary: Secondary | ICD-10-CM | POA: Diagnosis not present

## 2015-02-28 DIAGNOSIS — R609 Edema, unspecified: Secondary | ICD-10-CM

## 2015-02-28 NOTE — Therapy (Signed)
[facility] [address], [place], [place], [address] Phone: [phone]   Fax:  [phone]  Physical Therapy Treatment  Patient Details  Name: [name] MRN: [id number] Date of Birth: 12-14-1940 Referring Provider:  Mcarthur Rossetti*  Encounter Date: 02/28/2015      PT End of Session - 02/28/15 1052    Visit Number 7   Number of Visits 10   Date for PT Re-Evaluation 03/24/15   PT Start Time 1009   PT Stop Time 1105   PT Time Calculation (min) 56 min   Activity Tolerance Patient tolerated treatment well   Behavior During Therapy [facility] for tasks assessed/performed      Past Medical History  Diagnosis Date  . Hypertension   . Dyslipidemia   . Hypothyroidism   . Arthritis     OSTEOARTHRITIS   -- CONSTANT PAIN RIGHT HIP---AND PAIN LEFT KNEE--PT STATES SHE GETS INJECTIONS INTO HER KNEE  . Ovarian cancer   . PAF (paroxysmal atrial fibrillation)     Only on Plavix -- not full Anticoagulation per pt. request  . Complication of anesthesia     BLOOD PRESSURE DROPPED WITH NASAL SURGERY, ONE OF THE CARPAL TUNNEL REPAIRS AND DURING A COLONOSCOPY  . History of skin cancer   . Constipation     Past Surgical History  Procedure Laterality Date  . Bilateral carpal tunnel repair  2007  . Dilation and curettage of uterus  1969  . Surgery for ruptured ovarian cyst  1969  . Rhinoplasty for fractured nose  1986  . Left knee arthroscopy   2011  . Total hip arthroplasty  02/25/2012    Procedure: TOTAL HIP ARTHROPLASTY ANTERIOR APPROACH;  Surgeon: Mcarthur Rossetti, MD;  Location: [facility];  Service: Orthopedics;  Laterality: Right;  . Tonsillectomy  1962  . Laparotomy Bilateral 02/13/2013    Procedure: EXPLORATORY LAPAROTOMY TOTAL ABDOMINAL HYSTERECTOMY BILATERAL SALPINGO-OOPHORECTOMY, Partial Rectal Resection with Reanastamosis;  Surgeon: Alvino Chapel, MD;  Location: [facility];  Service: Gynecology;   Laterality: Bilateral;  . Omentectomy  02/13/2013    Procedure: OMENTECTOMY;  Surgeon: Alvino Chapel, MD;  [facility]: [facility];  Service: Gynecology;;  . Lymphadenectomy Right 02/13/2013    Procedure: PEVLIC  LYMPHADENECTOMY, DEBULKING right pelvic tumor nodules;  Surgeon: Alvino Chapel, MD;  [facility]: [facility];  Service: Gynecology;  Laterality: Right;  . Total abdominal hysterectomy  01/2013  . Transthoracic echocardiogram  May 2014    Normal LV size function. EF 60-65%. Grade 1 diastolic function. Mild MR and mildly elevated PA pressures of 37 mmHg per  . Nm myoview ltd  June 2010     subbmaximal with no ischemia or infarction.  . Total knee arthroplasty Left 01/03/2015    Procedure: LEFT TOTAL KNEE ARTHROPLASTY;  Surgeon: Mcarthur Rossetti, MD;  Location: [facility];  Service: Orthopedics;  Laterality: Left;    There were no vitals filed for this visit.  Visit Diagnosis:  Decreased strength involving knee joint  Decreased range of motion of knee, left  Edema      Subjective Assessment - 02/28/15 1009    Symptoms Rain makes me stiff. No pain reports this AM. I just need to get warmed up!   Currently in Pain? No/denies   Pain Descriptors / Indicators Tightness   Multiple Pain Sites No                       [facility] Adult  PT Treatment/Exercise - 02/28/15 0001    Knee/Hip Exercises: Aerobic   Stationary Bike L1 x 10 min   Knee/Hip Exercises: Standing   Forward Step Up Left;10 reps;Hand Hold: 1;Step Height: 4"   Rocker Board --  3 x 10 second holds for calf/knee   Other Standing Knee Exercises mini tramp 1 min 3 ways   Knee/Hip Exercises: Seated   Long Arc Quad Left;20 reps   Long Arc Quad Weight 2 lbs.   Cryotherapy   Number Minutes Cryotherapy 15 Minutes   Cryotherapy Location Knee   Type of Cryotherapy --  Gameready 3 flakes medium   Manual Therapy   Massage Soft tissue to Lt quad, distal knee and scar, post knee                   PT Short Term Goals - 02/24/15 1235    PT SHORT TERM GOAL #1   Title left knee flexion AROM >/= 115 degrees   Time 4   Period Weeks   Status Achieved   PT SHORT TERM GOAL #2   Title left knee extension PROM >/= -8 degrees   Time 4   Period Weeks   Status Achieved           PT Long Term Goals - 02/24/15 1235    PT LONG TERM GOAL #1   Title understand how to use ice  for pain relief   Time 8   Period Weeks   Status Achieved   PT LONG TERM GOAL #2   Title ambulate in the community with no assistive device and minimal gait deficits   Time 8   Period Weeks   Status On-going  Minimal use of cane   PT LONG TERM GOAL #4   Title return to her daily activities in the household due to left knee due to left knee strength >/=4+/5   Time 8   Period Weeks   Status On-going               Plan - 02/28/15 1052    Clinical Impression Statement Patient's knee less swelling coming into session today, pt doing light gardening, still walks with small steps, tolerated LAQ with wts today.   Pt will benefit from skilled therapeutic intervention in order to improve on the following deficits Decreased range of motion;Difficulty walking;Impaired flexibility;Pain;Increased muscle spasms;Decreased scar mobility;Decreased mobility;Decreased strength;Increased fascial restricitons;Decreased endurance;Increased edema;Abnormal gait   Rehab Potential Good   Clinical Impairments Affecting Rehab Potential none   PT Frequency 2x / week   PT Duration 8 weeks   PT Treatment/Interventions Gait training;Moist Heat;Therapeutic activities;Patient/family education;Scar mobilization;Passive range of motion;Therapeutic exercise;Manual techniques;Neuromuscular re-education;Cryotherapy;Electrical Stimulation;Other (comment)   PT Next Visit Plan Add LAQ to HEP "officially" add step ups   Consulted and Agree with Plan of Care Patient        Problem List Patient Active Problem List   Diagnosis Date Noted   . Osteoarthritis of left knee 01/03/2015  . Status post total left knee replacement 01/03/2015  . Preop cardiovascular exam 12/12/2014  . PAF (paroxysmal atrial fibrillation)   . Dyslipidemia   . Fever 05/03/2013    Class: Acute  . Nausea 05/03/2013    Class: Acute  . Constipation 05/03/2013    Class: Acute  . Syncope, history of 04/19/2013  . History of atrial fibrillation 04/19/2013  . Essential hypertension 04/19/2013  . Hypothyroidism 04/19/2013  . Malignant neoplasm of ovary 03/11/2013  . Elevated cancer antigen 125 (CA 125)  01/05/2013  . Degenerative arthritis of hip 02/25/2012    Jerimiah Wolman , PTA  02/28/2015, 10:55 AM  [facility] [address], [place], [place], [address] Phone: [phone]   Fax:  [phone]

## 2015-03-03 ENCOUNTER — Ambulatory Visit: Payer: Medicare Other | Admitting: Physical Therapy

## 2015-03-03 ENCOUNTER — Encounter: Payer: Self-pay | Admitting: Physical Therapy

## 2015-03-03 DIAGNOSIS — R29898 Other symptoms and signs involving the musculoskeletal system: Secondary | ICD-10-CM

## 2015-03-03 DIAGNOSIS — M24662 Ankylosis, left knee: Secondary | ICD-10-CM

## 2015-03-03 DIAGNOSIS — Z471 Aftercare following joint replacement surgery: Secondary | ICD-10-CM | POA: Diagnosis not present

## 2015-03-03 DIAGNOSIS — R609 Edema, unspecified: Secondary | ICD-10-CM

## 2015-03-03 NOTE — Therapy (Signed)
[facility] [address], [place], [place], [address] Phone: [phone]   Fax:  [phone]  Physical Therapy Treatment  Patient Details  Name: [name] MRN: [id number] Date of Birth: [birth year] Referring Provider:  Mcarthur Rossetti*  Encounter Date: 03/03/2015      PT End of Session - 03/03/15 1057    Visit Number 8   Number of Visits 10   Date for PT Re-Evaluation 03/24/15   PT Start Time 3491   PT Stop Time 1115   PT Time Calculation (min) 60 min   Activity Tolerance Patient tolerated treatment well   Behavior During Therapy [facility] for tasks assessed/performed      Past Medical History  Diagnosis Date  . Hypertension   . Dyslipidemia   . Hypothyroidism   . Arthritis     OSTEOARTHRITIS   -- CONSTANT PAIN RIGHT HIP---AND PAIN LEFT KNEE--PT STATES SHE GETS INJECTIONS INTO HER KNEE  . Ovarian cancer   . PAF (paroxysmal atrial fibrillation)     Only on Plavix -- not full Anticoagulation per pt. request  . Complication of anesthesia     BLOOD PRESSURE DROPPED WITH NASAL SURGERY, ONE OF THE CARPAL TUNNEL REPAIRS AND DURING A COLONOSCOPY  . History of skin cancer   . Constipation     Past Surgical History  Procedure Laterality Date  . Bilateral carpal tunnel repair  2007  . Dilation and curettage of uterus  1969  . Surgery for ruptured ovarian cyst  1969  . Rhinoplasty for fractured nose  1986  . Left knee arthroscopy   2011  . Total hip arthroplasty  02/25/2012    Procedure: TOTAL HIP ARTHROPLASTY ANTERIOR APPROACH;  Surgeon: Mcarthur Rossetti, MD;  [facility];  Service: Orthopedics;  Laterality: Right;  . Tonsillectomy  1962  . Laparotomy Bilateral 02/13/2013    Procedure: EXPLORATORY LAPAROTOMY TOTAL ABDOMINAL HYSTERECTOMY BILATERAL SALPINGO-OOPHORECTOMY, Partial Rectal Resection with Reanastamosis;  Surgeon: Alvino Chapel, MD;  [facility];  Service: Gynecology;   Laterality: Bilateral;  . Omentectomy  02/13/2013    Procedure: OMENTECTOMY;  Surgeon: Alvino Chapel, MD;  [facility];  Service: Gynecology;;  . Lymphadenectomy Right 02/13/2013    Procedure: PEVLIC  LYMPHADENECTOMY, DEBULKING right pelvic tumor nodules;  Surgeon: Alvino Chapel, MD;  [facility];  Service: Gynecology;  Laterality: Right;  . Total abdominal hysterectomy  01/2013  . Transthoracic echocardiogram  May 2014    Normal LV size function. EF 60-65%. Grade 1 diastolic function. Mild MR and mildly elevated PA pressures of 37 mmHg per  . Nm myoview ltd  June 2010     subbmaximal with no ischemia or infarction.  . Total knee arthroplasty Left 01/03/2015    Procedure: LEFT TOTAL KNEE ARTHROPLASTY;  Surgeon: Mcarthur Rossetti, MD;  [facility];  Service: Orthopedics;  Laterality: Left;    There were no vitals filed for this visit.  Visit Diagnosis:  Decreased strength involving knee joint  Decreased range of motion of knee, left  Edema      Subjective Assessment - 03/03/15 1021    Subjective Doing great, continues with no pain. Endurance about the same, planted flowers again.   Currently in Pain? No/denies                       [facility] Adult PT Treatment/Exercise - 03/03/15 0001    Knee/Hip Exercises: Aerobic   Stationary Bike L2  x10 min   Knee/Hip Exercises: Standing   Forward Step Up Left;10 reps;Hand Hold: 1;Step Height: 4"   Rocker Board --  3 x 10 second holds for calf/knee   Other Standing Knee Exercises mini tramp 1 min 3 ways   Knee/Hip Exercises: Seated   Long Arc Quad Left;20 reps   Long Arc Quad Weight 2 lbs.   Cryotherapy   Number Minutes Cryotherapy 15 Minutes   Cryotherapy Location Knee   Type of Cryotherapy --  Game ready 3 flakes medium   Manual Therapy   Massage Soft tissue to Lt quad, distal knee and scar, post knee                  PT Short Term Goals - 03/03/15 1059    PT SHORT TERM  GOAL #1   Title left knee flexion AROM >/= 115 degrees   Time 4   Period Weeks   Status Achieved   PT SHORT TERM GOAL #2   Title left knee extension PROM >/= -8 degrees   Time 4   Period Weeks   Status Achieved           PT Long Term Goals - 03/03/15 1059    PT LONG TERM GOAL #1   Title understand how to use ice  for pain relief   Time 8   Period Weeks   Status Achieved   PT LONG TERM GOAL #2   Title ambulate in the community with no assistive device and minimal gait deficits   Time 8   Period Weeks   Status On-going   PT LONG TERM GOAL #3   Title go up and down steps with step over step pattern and using a railing due to left knee AROM >/= 118 degrees   Time 8   Period Weeks   Status Achieved   PT LONG TERM GOAL #4   Title return to her daily activities in the household due to left knee due to left knee strength >/=4+/5   Time 8   Period Weeks   Status On-going               Plan - 03/03/15 1057    Clinical Impression Statement Knee extension full passive today. patient is going to try adding shor walks with husband into her HEP.   Pt will benefit from skilled therapeutic intervention in order to improve on the following deficits Decreased range of motion;Difficulty walking;Impaired flexibility;Pain;Increased muscle spasms;Decreased scar mobility;Decreased mobility;Decreased strength;Increased fascial restricitons;Decreased endurance;Increased edema;Abnormal gait   Rehab Potential Good   Clinical Impairments Affecting Rehab Potential none   PT Frequency 2x / week   PT Duration 8 weeks   PT Treatment/Interventions Gait training;Moist Heat;Therapeutic activities;Patient/family education;Scar mobilization;Passive range of motion;Therapeutic exercise;Manual techniques;Neuromuscular re-education;Cryotherapy;Electrical Stimulation;Other (comment)   PT Next Visit Plan Work on functional activity tolerance   Consulted and Agree with Plan of Care Patient         Problem List Patient Active Problem List   Diagnosis Date Noted  . Osteoarthritis of left knee 01/03/2015  . Status post total left knee replacement 01/03/2015  . Preop cardiovascular exam 12/12/2014  . PAF (paroxysmal atrial fibrillation)   . Dyslipidemia   . Fever 05/03/2013    Class: Acute  . Nausea 05/03/2013    Class: Acute  . Constipation 05/03/2013    Class: Acute  . Syncope, history of 04/19/2013  . History of atrial fibrillation 04/19/2013  . Essential hypertension 04/19/2013  . Hypothyroidism  04/19/2013  . Malignant neoplasm of ovary 03/11/2013  . Elevated cancer antigen 125 (CA 125) 01/05/2013  . Degenerative arthritis of hip 02/25/2012    Michial Disney, PTA 03/03/2015, 11:01 AM  [facility] [address], [place], [place], [address] Phone: [phone]   Fax:  [phone]   Knee AROM: 6-128 Passive Extension  degrees

## 2015-03-03 NOTE — Patient Instructions (Signed)
Begin walking outdoors, flat surfaces, starting with 5-10 min building gradually. Pt agreed and understood.

## 2015-03-07 ENCOUNTER — Encounter: Payer: Self-pay | Admitting: Physical Therapy

## 2015-03-07 ENCOUNTER — Ambulatory Visit: Payer: Medicare Other | Admitting: Physical Therapy

## 2015-03-07 DIAGNOSIS — R29898 Other symptoms and signs involving the musculoskeletal system: Secondary | ICD-10-CM

## 2015-03-07 DIAGNOSIS — Z471 Aftercare following joint replacement surgery: Secondary | ICD-10-CM | POA: Diagnosis not present

## 2015-03-07 DIAGNOSIS — R609 Edema, unspecified: Secondary | ICD-10-CM

## 2015-03-07 NOTE — Therapy (Signed)
[facility] [address] [place], [place], [address] Phone: [phone]   Fax:  [phone]  Physical Therapy Treatment  Patient Details  Name: [name] MRN: [id number] Date of Birth: March 27, 1941 Referring Provider:  Mcarthur Rossetti*  Encounter Date: 03/07/2015      PT End of Session - 03/07/15 1040    Visit Number 9   Number of Visits 10   Date for PT Re-Evaluation 03/24/15   PT Start Time 1008   PT Stop Time 1115   PT Time Calculation (min) 67 min   Activity Tolerance Patient tolerated treatment well   Behavior During Therapy [facility] for tasks assessed/performed      Past Medical History  Diagnosis Date  . Hypertension   . Dyslipidemia   . Hypothyroidism   . Arthritis     OSTEOARTHRITIS   -- CONSTANT PAIN RIGHT HIP---AND PAIN LEFT KNEE--PT STATES SHE GETS INJECTIONS INTO HER KNEE  . Ovarian cancer   . PAF (paroxysmal atrial fibrillation)     Only on Plavix -- not full Anticoagulation per pt. request  . Complication of anesthesia     BLOOD PRESSURE DROPPED WITH NASAL SURGERY, ONE OF THE CARPAL TUNNEL REPAIRS AND DURING A COLONOSCOPY  . History of skin cancer   . Constipation     Past Surgical History  Procedure Laterality Date  . Bilateral carpal tunnel repair  2007  . Dilation and curettage of uterus  1969  . Surgery for ruptured ovarian cyst  1969  . Rhinoplasty for fractured nose  1986  . Left knee arthroscopy   2011  . Total hip arthroplasty  02/25/2012    Procedure: TOTAL HIP ARTHROPLASTY ANTERIOR APPROACH;  Surgeon: Mcarthur Rossetti, MD;  [facility];  Service: Orthopedics;  Laterality: Right;  . Tonsillectomy  1962  . Laparotomy Bilateral 02/13/2013    Procedure: EXPLORATORY LAPAROTOMY TOTAL ABDOMINAL HYSTERECTOMY BILATERAL SALPINGO-OOPHORECTOMY, Partial Rectal Resection with Reanastamosis;  Surgeon: Alvino Chapel, MD;  [facility];  Service: Gynecology;   Laterality: Bilateral;  . Omentectomy  02/13/2013    Procedure: OMENTECTOMY;  Surgeon: Alvino Chapel, MD;  [facility];  Service: Gynecology;;  . Lymphadenectomy Right 02/13/2013    Procedure: PEVLIC  LYMPHADENECTOMY, DEBULKING right pelvic tumor nodules;  Surgeon: Alvino Chapel, MD;  [facility];  Service: Gynecology;  Laterality: Right;  . Total abdominal hysterectomy  01/2013  . Transthoracic echocardiogram  May 2014    Normal LV size function. EF 60-65%. Grade 1 diastolic function. Mild MR and mildly elevated PA pressures of 37 mmHg per  . Nm myoview ltd  June 2010     subbmaximal with no ischemia or infarction.  . Total knee arthroplasty Left 01/03/2015    Procedure: LEFT TOTAL KNEE ARTHROPLASTY;  Surgeon: Mcarthur Rossetti, MD;  [facility];  Service: Orthopedics;  Laterality: Left;    There were no vitals filed for this visit.  Visit Diagnosis:  Decreased strength involving knee joint  Edema      Subjective Assessment - 03/07/15 1012    Subjective Feeling stiff this AM.    Currently in Pain? No/denies  Stiff   Aggravating Factors  Standing and walking prolonged times   Pain Relieving Factors Rest and ice   Multiple Pain Sites No                       [facility] Adult PT Treatment/Exercise - 03/07/15 0001  Knee/Hip Exercises: Aerobic   Stationary Bike L2 x10   Knee/Hip Exercises: Standing   Forward Step Up 2 sets;10 reps   Rocker Board --  20x   Walking with Sports Cord 20# 10x fwd/bkwd   Other Standing Knee Exercises mini tramp 1 min 3 ways   Knee/Hip Exercises: Seated   Long Arc Quad --  3x10   Long Arc Quad Weight 2 lbs.   Cryotherapy   Number Minutes Cryotherapy 15 Minutes   Cryotherapy Location Knee   Type of Cryotherapy --  Gameready 3 flakes medium   Manual Therapy   Massage Soft tissue to Lt quad, distal knee and scar, post knee                  PT Short Term Goals - 03/03/15 1059    PT SHORT  TERM GOAL #1   Title left knee flexion AROM >/= 115 degrees   Time 4   Period Weeks   Status Achieved   PT SHORT TERM GOAL #2   Title left knee extension PROM >/= -8 degrees   Time 4   Period Weeks   Status Achieved           PT Long Term Goals - 03/03/15 1059    PT LONG TERM GOAL #1   Title understand how to use ice  for pain relief   Time 8   Period Weeks   Status Achieved   PT LONG TERM GOAL #2   Title ambulate in the community with no assistive device and minimal gait deficits   Time 8   Period Weeks   Status On-going   PT LONG TERM GOAL #3   Title go up and down steps with step over step pattern and using a railing due to left knee AROM >/= 118 degrees   Time 8   Period Weeks   Status Achieved   PT LONG TERM GOAL #4   Title return to her daily activities in the household due to left knee due to left knee strength >/=4+/5   Time 8   Period Weeks   Status On-going               Plan - 03/07/15 1041    Clinical Impression Statement Patient walking for exercise 2x this week.  Working towards increasing quad strength and endurance.   Pt will benefit from skilled therapeutic intervention in order to improve on the following deficits Decreased range of motion;Difficulty walking;Impaired flexibility;Pain;Increased muscle spasms;Decreased scar mobility;Decreased mobility;Decreased strength;Increased fascial restricitons;Decreased endurance;Increased edema;Abnormal gait   Rehab Potential Good   Clinical Impairments Affecting Rehab Potential none   PT Frequency 2x / week   PT Duration 8 weeks   PT Treatment/Interventions Gait training;Moist Heat;Therapeutic activities;Patient/family education;Scar mobilization;Passive range of motion;Therapeutic exercise;Manual techniques;Neuromuscular re-education;Cryotherapy;Electrical Stimulation;Other (comment)   PT Next Visit Plan Continue with cable walk, more endurance exercises so pt can be on feet longer.   Consulted and  Agree with Plan of Care Patient        Problem List Patient Active Problem List   Diagnosis Date Noted  . Osteoarthritis of left knee 01/03/2015  . Status post total left knee replacement 01/03/2015  . Preop cardiovascular exam 12/12/2014  . PAF (paroxysmal atrial fibrillation)   . Dyslipidemia   . Fever 05/03/2013    Class: Acute  . Nausea 05/03/2013    Class: Acute  . Constipation 05/03/2013    Class: Acute  . Syncope, history of 04/19/2013  .  History of atrial fibrillation 04/19/2013  . Essential hypertension 04/19/2013  . Hypothyroidism 04/19/2013  . Malignant neoplasm of ovary 03/11/2013  . Elevated cancer antigen 125 (CA 125) 01/05/2013  . Degenerative arthritis of hip 02/25/2012    COCHRAN,JENNIFER, PTA 03/07/2015, 10:57 AM  [facility] [address] [place], [place], [address] Phone: [phone]   Fax:  [phone]   Patient wil need FOTO next visit

## 2015-03-10 ENCOUNTER — Ambulatory Visit: Payer: Medicare Other | Admitting: Physical Therapy

## 2015-03-10 ENCOUNTER — Encounter: Payer: Self-pay | Admitting: Physical Therapy

## 2015-03-10 DIAGNOSIS — Z471 Aftercare following joint replacement surgery: Secondary | ICD-10-CM | POA: Diagnosis not present

## 2015-03-10 DIAGNOSIS — R29898 Other symptoms and signs involving the musculoskeletal system: Secondary | ICD-10-CM

## 2015-03-10 DIAGNOSIS — M24662 Ankylosis, left knee: Secondary | ICD-10-CM

## 2015-03-10 NOTE — Therapy (Signed)
[facility] [address], [place], [place], [address] Phone: [phone]   Fax:  [phone]  Physical Therapy Treatment  Patient Details  Name: [name] MRN: [id number] Date of Birth: Dec 12, 1940 Referring Provider:  Mcarthur Rossetti*  Encounter Date: 03/10/2015      PT End of Session - 03/10/15 1053    Visit Number 10   Date for PT Re-Evaluation 03/24/15   PT Start Time 1194   PT Stop Time 1115   PT Time Calculation (min) 60 min   Activity Tolerance Patient tolerated treatment well   Behavior During Therapy [facility] for tasks assessed/performed      Past Medical History  Diagnosis Date  . Hypertension   . Dyslipidemia   . Hypothyroidism   . Arthritis     OSTEOARTHRITIS   -- CONSTANT PAIN RIGHT HIP---AND PAIN LEFT KNEE--PT STATES SHE GETS INJECTIONS INTO HER KNEE  . Ovarian cancer   . PAF (paroxysmal atrial fibrillation)     Only on Plavix -- not full Anticoagulation per pt. request  . Complication of anesthesia     BLOOD PRESSURE DROPPED WITH NASAL SURGERY, ONE OF THE CARPAL TUNNEL REPAIRS AND DURING A COLONOSCOPY  . History of skin cancer   . Constipation     Past Surgical History  Procedure Laterality Date  . Bilateral carpal tunnel repair  2007  . Dilation and curettage of uterus  1969  . Surgery for ruptured ovarian cyst  1969  . Rhinoplasty for fractured nose  1986  . Left knee arthroscopy   2011  . Total hip arthroplasty  02/25/2012    Procedure: TOTAL HIP ARTHROPLASTY ANTERIOR APPROACH;  Surgeon: Mcarthur Rossetti, MD;  [facility]: [facility];  Service: Orthopedics;  Laterality: Right;  . Tonsillectomy  1962  . Laparotomy Bilateral 02/13/2013    Procedure: EXPLORATORY LAPAROTOMY TOTAL ABDOMINAL HYSTERECTOMY BILATERAL SALPINGO-OOPHORECTOMY, Partial Rectal Resection with Reanastamosis;  Surgeon: Alvino Chapel, MD;  [facility];  Service: Gynecology;  Laterality: Bilateral;   . Omentectomy  02/13/2013    Procedure: OMENTECTOMY;  Surgeon: Alvino Chapel, MD;  [facility];  Service: Gynecology;;  . Lymphadenectomy Right 02/13/2013    Procedure: PEVLIC  LYMPHADENECTOMY, DEBULKING right pelvic tumor nodules;  Surgeon: Alvino Chapel, MD;  [facility];  Service: Gynecology;  Laterality: Right;  . Total abdominal hysterectomy  01/2013  . Transthoracic echocardiogram  May 2014    Normal LV size function. EF 60-65%. Grade 1 diastolic function. Mild MR and mildly elevated PA pressures of 37 mmHg per  . Nm myoview ltd  June 2010     subbmaximal with no ischemia or infarction.  . Total knee arthroplasty Left 01/03/2015    Procedure: LEFT TOTAL KNEE ARTHROPLASTY;  Surgeon: Mcarthur Rossetti, MD;  [facility];  Service: Orthopedics;  Laterality: Left;    There were no vitals filed for this visit.  Visit Diagnosis:  Decreased range of motion of knee, left  Decreased strength involving knee joint      Subjective Assessment - 03/10/15 1024    Subjective I walk slowly due to my back pain. Patient reports difficulty getting her left leg into the car.  Patient reports stairs are difficult due feeling unsteady in her hips and knees.  She feels like her knees will not hold her up.    Pertinent History Ovarian cancer; Left total knee replacement   Limitations Walking;Standing;House hold activities   How long can you stand  comfortably? stood for 45 min. with no increase in pain but felt achy and tired.    How long can you walk comfortably? 60 minutes walking slowly in the grocery store   Patient Stated Goals Walk normally   Currently in Pain? No/denies   Multiple Pain Sites No            [facility] PT Assessment - 03/10/15 0001    PROM   Left Knee Extension -2   Left Knee Flexion 125   Strength   Left Knee Flexion 4+/5   Left Knee Extension 4-/5                   [facility] Adult PT Treatment/Exercise - 03/10/15 0001     Ambulation/Gait   Stairs Yes   Stairs Assistance 6: Modified independent (Device/Increase time)   Stair Management Technique No rails;Forwards   Number of Stairs 4  tactlie cue to hips to contract core   Knee/Hip Exercises: Aerobic   Stationary Bike L2 x10   Knee/Hip Exercises: Standing   Terminal Knee Extension Strengthening;Left;1 set;10 reps   Theraband Level (Terminal Knee Extension) Level 2 (Red)   Terminal Knee Extension Limitations therapist gave tactile cues to contract quads   Forward Step Up 2 sets;10 reps  tactile cues to keep hips leveled, contract core   Rebounder high march 1 min, heel raises 30 times   Knee/Hip Exercises: Seated   Long Arc Quad Strengthening;Left;3 sets;10 reps   Long Arc Quad Weight 3 lbs.   Cryotherapy   Number Minutes Cryotherapy 15 Minutes   Cryotherapy Location Knee   Type of Cryotherapy Other (comment)  game ready 3 flakes   Manual Therapy   Manual Therapy Passive ROM   Passive ROM left knee for flexion and extension                 PT Education - 03/10/15 1053    Education provided No          PT Short Term Goals - 03/03/15 1059    PT SHORT TERM GOAL #1   Title left knee flexion AROM >/= 115 degrees   Time 4   Period Weeks   Status Achieved   PT SHORT TERM GOAL #2   Title left knee extension PROM >/= -8 degrees   Time 4   Period Weeks   Status Achieved           PT Long Term Goals - 03/10/15 1057    PT LONG TERM GOAL #2   Title ambulate in the community with no assistive device and minimal gait deficits   Time 8   Period Weeks   Status On-going  walks slowly   PT LONG TERM GOAL #3   Title go up and down steps with step over step pattern and using a railing due to left knee AROM >/= 118 degrees   Time 8   Period Weeks   Status Achieved   PT LONG TERM GOAL #4   Title return to her daily activities in the household due to left knee due to left knee strength >/=4+/5   Time 8   Period Weeks   Status  On-going  knee extension 4-/5               Plan - 03/10/15 1055    Clinical Impression Statement Patient has difficulty with extending her left knee while ambulating.  Patient has increased left knee strength and ROM.    Pt will  benefit from skilled therapeutic intervention in order to improve on the following deficits Decreased range of motion;Difficulty walking;Impaired flexibility;Pain;Increased muscle spasms;Decreased scar mobility;Decreased mobility;Decreased strength;Increased fascial restricitons;Decreased endurance;Increased edema;Abnormal gait   Clinical Impairments Affecting Rehab Potential none   PT Frequency 2x / week   PT Duration 8 weeks   PT Treatment/Interventions Gait training;Moist Heat;Therapeutic activities;Patient/family education;Scar mobilization;Passive range of motion;Therapeutic exercise;Manual techniques;Neuromuscular re-education;Cryotherapy;Electrical Stimulation;Other (comment)   PT Next Visit Plan work on TKE in [place] quad strength   Consulted and Agree with Plan of Care Patient          G-Codes - 03-18-15 1058    Functional Assessment Tool Used FOTO score is 68% limitation   Functional Limitation Mobility: Walking and moving around   Mobility: Walking and Moving Around Current Status ([id number]) At least 40 percent but less than 60 percent impaired, limited or restricted   Mobility: Walking and Moving Around Goal Status [phone]) At least 40 percent but less than 60 percent impaired, limited or restricted      Problem List Patient Active Problem List   Diagnosis Date Noted  . Osteoarthritis of left knee 01/03/2015  . Status post total left knee replacement 01/03/2015  . Preop cardiovascular exam 12/12/2014  . PAF (paroxysmal atrial fibrillation)   . Dyslipidemia   . Fever 05/03/2013    Class: Acute  . Nausea 05/03/2013    Class: Acute  . Constipation 05/03/2013    Class: Acute  . Syncope, history of 04/19/2013  .  History of atrial fibrillation 04/19/2013  . Essential hypertension 04/19/2013  . Hypothyroidism 04/19/2013  . Malignant neoplasm of ovary 03/11/2013  . Elevated cancer antigen 125 (CA 125) 01/05/2013  . Degenerative arthritis of hip 02/25/2012    Derris Millan,PT 03-18-15, 11:01 AM  [facility] [address], [place], [place], [address] Phone: [phone]   Fax:  [phone]

## 2015-03-13 ENCOUNTER — Encounter: Payer: Self-pay | Admitting: Physical Therapy

## 2015-03-13 ENCOUNTER — Ambulatory Visit: Payer: Medicare Other | Admitting: Physical Therapy

## 2015-03-13 DIAGNOSIS — M24662 Ankylosis, left knee: Secondary | ICD-10-CM

## 2015-03-13 DIAGNOSIS — R29898 Other symptoms and signs involving the musculoskeletal system: Secondary | ICD-10-CM

## 2015-03-13 DIAGNOSIS — R609 Edema, unspecified: Secondary | ICD-10-CM

## 2015-03-13 DIAGNOSIS — Z471 Aftercare following joint replacement surgery: Secondary | ICD-10-CM | POA: Diagnosis not present

## 2015-03-13 NOTE — Therapy (Signed)
[facility] [address] [place], [place], [address] Phone: [phone]   Fax:  [phone]  Physical Therapy Treatment  Patient Details  Name: [name] MRN: [id number] Date of Birth: Jan 25, 1941 Referring Provider:  Mcarthur Rossetti*  Encounter Date: 03/13/2015      PT End of Session - 03/13/15 1044    Visit Number 11   Date for PT Re-Evaluation 03/24/15   PT Start Time 5643   PT Stop Time 1115   PT Time Calculation (min) 60 min   Activity Tolerance Patient tolerated treatment well   Behavior During Therapy [facility] for tasks assessed/performed      Past Medical History  Diagnosis Date  . Hypertension   . Dyslipidemia   . Hypothyroidism   . Arthritis     OSTEOARTHRITIS   -- CONSTANT PAIN RIGHT HIP---AND PAIN LEFT KNEE--PT STATES SHE GETS INJECTIONS INTO HER KNEE  . Ovarian cancer   . PAF (paroxysmal atrial fibrillation)     Only on Plavix -- not full Anticoagulation per pt. request  . Complication of anesthesia     BLOOD PRESSURE DROPPED WITH NASAL SURGERY, ONE OF THE CARPAL TUNNEL REPAIRS AND DURING A COLONOSCOPY  . History of skin cancer   . Constipation     Past Surgical History  Procedure Laterality Date  . Bilateral carpal tunnel repair  2007  . Dilation and curettage of uterus  1969  . Surgery for ruptured ovarian cyst  1969  . Rhinoplasty for fractured nose  1986  . Left knee arthroscopy   2011  . Total hip arthroplasty  02/25/2012    Procedure: TOTAL HIP ARTHROPLASTY ANTERIOR APPROACH;  Surgeon: Mcarthur Rossetti, MD;  [facility];  Service: Orthopedics;  Laterality: Right;  . Tonsillectomy  1962  . Laparotomy Bilateral 02/13/2013    Procedure: EXPLORATORY LAPAROTOMY TOTAL ABDOMINAL HYSTERECTOMY BILATERAL SALPINGO-OOPHORECTOMY, Partial Rectal Resection with Reanastamosis;  Surgeon: Alvino Chapel, MD;  [facility];  Service: Gynecology;  Laterality: Bilateral;   . Omentectomy  02/13/2013    Procedure: OMENTECTOMY;  Surgeon: Alvino Chapel, MD;  [facility];  Service: Gynecology;;  . Lymphadenectomy Right 02/13/2013    Procedure: PEVLIC  LYMPHADENECTOMY, DEBULKING right pelvic tumor nodules;  Surgeon: Alvino Chapel, MD;  [facility];  Service: Gynecology;  Laterality: Right;  . Total abdominal hysterectomy  01/2013  . Transthoracic echocardiogram  May 2014    Normal LV size function. EF 60-65%. Grade 1 diastolic function. Mild MR and mildly elevated PA pressures of 37 mmHg per  . Nm myoview ltd  June 2010     subbmaximal with no ischemia or infarction.  . Total knee arthroplasty Left 01/03/2015    Procedure: LEFT TOTAL KNEE ARTHROPLASTY;  Surgeon: Mcarthur Rossetti, MD;  [facility];  Service: Orthopedics;  Laterality: Left;    There were no vitals filed for this visit.  Visit Diagnosis:  Decreased strength involving knee joint  Decreased range of motion of knee, left  Edema      Subjective Assessment - 03/13/15 1026    Subjective My knee is fine.  Patient reports everything is getting easier.  Patient reports her left knee feels wobbly.  No pain.    Pertinent History Ovarian cancer; Left total knee replacement   Limitations Walking;Standing;House hold activities   How long can you stand comfortably? stood for 45 min. with no increase in pain but felt achy and tired.    How  long can you walk comfortably? 60 minutes walking slowly in the grocery store   Patient Stated Goals Walk normally   Currently in Pain? No/denies                       [facility] Adult PT Treatment/Exercise - 03/13/15 0001    Knee/Hip Exercises: Aerobic   Stationary Bike L2 x10   Knee/Hip Exercises: Standing   Terminal Knee Extension Strengthening;Left;20 reps   Terminal Knee Extension Limitations tactile cues to extend knee   Rocker Board 1 minute   Rocker Board Limitations keeping balance for knee control, side to side   2x forward and back 2x   Other Standing Knee Exercises stand on left leg with right hip marching 15x, then right hip abd 15x   tactile cues to increased left knee extension   Other Standing Knee Exercises tandem stance with left foot behind right hold 30 sec while therpist pushes trunk at different angles   Knee/Hip Exercises: Seated   Long Arc Quad Strengthening;Left;3 sets;10 reps   Long Arc Quad Weight 3 lbs.   Cryotherapy   Number Minutes Cryotherapy 15 Minutes   Cryotherapy Location Knee   Type of Cryotherapy Ice massage  vaso to left knee elevated   Manual Therapy   Manual Therapy Passive ROM   Passive ROM left knee for flexion and extension                PT Education - 03/13/15 1057    Education provided No          PT Short Term Goals - 03/03/15 1059    PT SHORT TERM GOAL #1   Title left knee flexion AROM >/= 115 degrees   Time 4   Period Weeks   Status Achieved   PT SHORT TERM GOAL #2   Title left knee extension PROM >/= -8 degrees   Time 4   Period Weeks   Status Achieved           PT Long Term Goals - 03/10/15 1057    PT LONG TERM GOAL #2   Title ambulate in the community with no assistive device and minimal gait deficits   Time 8   Period Weeks   Status On-going  walks slowly   PT LONG TERM GOAL #3   Title go up and down steps with step over step pattern and using a railing due to left knee AROM >/= 118 degrees   Time 8   Period Weeks   Status Achieved   PT LONG TERM GOAL #4   Title return to her daily activities in the household due to left knee due to left knee strength >/=4+/5   Time 8   Period Weeks   Status On-going  knee extension 4-/5               Plan - 03/13/15 1057    Clinical Impression Statement Patient continues to have difficulty with TKE.  Patient needs tactile cues to assist in knee extension with closed chain exercises.    Pt will benefit from skilled therapeutic intervention in order to improve on the  following deficits Decreased range of motion;Difficulty walking;Impaired flexibility;Pain;Increased muscle spasms;Decreased scar mobility;Decreased mobility;Decreased strength;Increased fascial restricitons;Decreased endurance;Increased edema;Abnormal gait   Rehab Potential Good   Clinical Impairments Affecting Rehab Potential none   PT Frequency 2x / week   PT Duration 8 weeks   PT Treatment/Interventions Gait training;Moist Heat;Therapeutic activities;Patient/family education;Scar mobilization;Passive range of  motion;Therapeutic exercise;Manual techniques;Neuromuscular re-education;Cryotherapy;Electrical Stimulation;Other (comment)   PT Next Visit Plan work on TKE in standing, closed chain exercises; Write MD progress note   PT Home Exercise Plan quad strength   Consulted and Agree with Plan of Care Patient        Problem List Patient Active Problem List   Diagnosis Date Noted  . Osteoarthritis of left knee 01/03/2015  . Status post total left knee replacement 01/03/2015  . Preop cardiovascular exam 12/12/2014  . PAF (paroxysmal atrial fibrillation)   . Dyslipidemia   . Fever 05/03/2013    Class: Acute  . Nausea 05/03/2013    Class: Acute  . Constipation 05/03/2013    Class: Acute  . Syncope, history of 04/19/2013  . History of atrial fibrillation 04/19/2013  . Essential hypertension 04/19/2013  . Hypothyroidism 04/19/2013  . Malignant neoplasm of ovary 03/11/2013  . Elevated cancer antigen 125 (CA 125) 01/05/2013  . Degenerative arthritis of hip 02/25/2012    Lakenya Riendeau,PT 03/13/2015, 11:00 AM  [facility] [address], [place], [place], [address] Phone: [phone]   Fax:  [phone]

## 2015-03-14 ENCOUNTER — Other Ambulatory Visit (HOSPITAL_BASED_OUTPATIENT_CLINIC_OR_DEPARTMENT_OTHER): Payer: Medicare Other

## 2015-03-14 ENCOUNTER — Other Ambulatory Visit: Payer: Self-pay | Admitting: Cardiology

## 2015-03-14 DIAGNOSIS — C562 Malignant neoplasm of left ovary: Secondary | ICD-10-CM | POA: Diagnosis not present

## 2015-03-14 DIAGNOSIS — C569 Malignant neoplasm of unspecified ovary: Secondary | ICD-10-CM

## 2015-03-14 DIAGNOSIS — C561 Malignant neoplasm of right ovary: Secondary | ICD-10-CM | POA: Diagnosis not present

## 2015-03-14 LAB — CA 125: CA 125: 35 U/mL — ABNORMAL HIGH (ref <35)

## 2015-03-14 NOTE — Telephone Encounter (Signed)
E sent to pharmacy 

## 2015-03-17 ENCOUNTER — Encounter: Payer: Self-pay | Admitting: Physical Therapy

## 2015-03-17 ENCOUNTER — Ambulatory Visit: Payer: Medicare Other | Admitting: Physical Therapy

## 2015-03-17 DIAGNOSIS — M24662 Ankylosis, left knee: Secondary | ICD-10-CM

## 2015-03-17 DIAGNOSIS — R29898 Other symptoms and signs involving the musculoskeletal system: Secondary | ICD-10-CM

## 2015-03-17 DIAGNOSIS — Z471 Aftercare following joint replacement surgery: Secondary | ICD-10-CM | POA: Diagnosis not present

## 2015-03-17 NOTE — Therapy (Signed)
[facility] [address] [place], [place], [address] Phone: [phone]   Fax:  [phone]  Physical Therapy Treatment  Patient Details  Name: [name] MRN: [id number] Date of Birth: March 23, 1941 Referring Provider:  Mcarthur Rossetti*  Encounter Date: 03/17/2015      PT End of Session - 03/17/15 1052    Visit Number 12   Date for PT Re-Evaluation 03/24/15   PT Start Time 6962   PT Stop Time 1055   PT Time Calculation (min) 40 min   Activity Tolerance Patient tolerated treatment well   Behavior During Therapy [facility] for tasks assessed/performed      Past Medical History  Diagnosis Date  . Hypertension   . Dyslipidemia   . Hypothyroidism   . Arthritis     OSTEOARTHRITIS   -- CONSTANT PAIN RIGHT HIP---AND PAIN LEFT KNEE--PT STATES SHE GETS INJECTIONS INTO HER KNEE  . Ovarian cancer   . PAF (paroxysmal atrial fibrillation)     Only on Plavix -- not full Anticoagulation per pt. request  . Complication of anesthesia     BLOOD PRESSURE DROPPED WITH NASAL SURGERY, ONE OF THE CARPAL TUNNEL REPAIRS AND DURING A COLONOSCOPY  . History of skin cancer   . Constipation     Past Surgical History  Procedure Laterality Date  . Bilateral carpal tunnel repair  2007  . Dilation and curettage of uterus  1969  . Surgery for ruptured ovarian cyst  1969  . Rhinoplasty for fractured nose  1986  . Left knee arthroscopy   2011  . Total hip arthroplasty  02/25/2012    Procedure: TOTAL HIP ARTHROPLASTY ANTERIOR APPROACH;  Surgeon: Mcarthur Rossetti, MD;  [facility];  Service: Orthopedics;  Laterality: Right;  . Tonsillectomy  1962  . Laparotomy Bilateral 02/13/2013    Procedure: EXPLORATORY LAPAROTOMY TOTAL ABDOMINAL HYSTERECTOMY BILATERAL SALPINGO-OOPHORECTOMY, Partial Rectal Resection with Reanastamosis;  Surgeon: Alvino Chapel, MD;  [facility];  Service: Gynecology;  Laterality: Bilateral;   . Omentectomy  02/13/2013    Procedure: OMENTECTOMY;  Surgeon: Alvino Chapel, MD;  [facility];  Service: Gynecology;;  . Lymphadenectomy Right 02/13/2013    Procedure: PEVLIC  LYMPHADENECTOMY, DEBULKING right pelvic tumor nodules;  Surgeon: Alvino Chapel, MD;  [facility];  Service: Gynecology;  Laterality: Right;  . Total abdominal hysterectomy  01/2013  . Transthoracic echocardiogram  May 2014    Normal LV size function. EF 60-65%. Grade 1 diastolic function. Mild MR and mildly elevated PA pressures of 37 mmHg per  . Nm myoview ltd  June 2010     subbmaximal with no ischemia or infarction.  . Total knee arthroplasty Left 01/03/2015    Procedure: LEFT TOTAL KNEE ARTHROPLASTY;  Surgeon: Mcarthur Rossetti, MD;  [facility];  Service: Orthopedics;  Laterality: Left;    There were no vitals filed for this visit.  Visit Diagnosis:  Decreased strength involving knee joint  Decreased range of motion of knee, left      Subjective Assessment - 03/17/15 1027    Subjective My back is hurting.  Patient reports her left knee feels wobbly. Patient reports difficulty with getting in and out of car. No left knee pain just tightness in morning.    Pertinent History Ovarian cancer; Left total knee replacement   How long can you stand comfortably? stood for 45 min. with difficulty in back pain.    How long can you walk comfortably?  no diffilculty with left knee but her back pain is limiting her   Currently in Pain? Yes   Pain Score 2   back   Pain Location Back   Pain Orientation Mid   Pain Descriptors / Indicators Tightness   Pain Type Chronic pain   Pain Onset More than a month ago   Pain Frequency Intermittent   Aggravating Factors  unable to get down on knees due to back and knee   Pain Relieving Factors rest ice   Multiple Pain Sites No            [facility] PT Assessment - 03/17/15 0001    AROM   Left Knee Extension -4   Left Knee Flexion 120    PROM   Left Knee Extension -2   Left Knee Flexion 128   Strength   Left Knee Flexion 4+/5   Left Knee Extension 4+/5                   [facility] Adult PT Treatment/Exercise - 03/17/15 0001    Knee/Hip Exercises: Standing   Terminal Knee Extension Strengthening;Left;20 reps   Theraband Level (Terminal Knee Extension) --  press ball into wall   Terminal Knee Extension Limitations tactile cues to extend knee   Other Standing Knee Exercises stand on left leg with right hip marching 15x, then right hip abd 15x   tactile cues to increased left knee extension   Knee/Hip Exercises: Seated   Long Arc Quad Strengthening;Left;3 sets;10 reps   Long Arc Quad Weight 3 lbs.  tactile cues for extension   Manual Therapy   Manual Therapy Passive ROM   Passive ROM left knee for flexion and extension                PT Education - 03/17/15 1057    Education provided No          PT Short Term Goals - 03/03/15 1059    PT SHORT TERM GOAL #1   Title left knee flexion AROM >/= 115 degrees   Time 4   Period Weeks   Status Achieved   PT SHORT TERM GOAL #2   Title left knee extension PROM >/= -8 degrees   Time 4   Period Weeks   Status Achieved           PT Long Term Goals - 03/17/15 1024    PT LONG TERM GOAL #2   Title ambulate in the community with no assistive device and minimal gait deficits   Time 8   Period Weeks   Status Achieved   PT LONG TERM GOAL #4   Title return to her daily activities in the household due to left knee due to left knee strength >/=4+/5   Time 8   Period Weeks   Status Achieved               Plan - 03/17/15 1053    Clinical Impression Statement Patient has increased left knee ROM and strength.  Patient is able to return to daily activities but is limited with her back pain. Patient may be ready for discharged due to her progress.    Pt will benefit from skilled therapeutic intervention in order to improve on the following deficits  Decreased range of motion;Difficulty walking;Impaired flexibility;Pain;Increased muscle spasms;Decreased scar mobility;Decreased mobility;Decreased strength;Increased fascial restricitons;Decreased endurance;Increased edema;Abnormal gait   Rehab Potential Good   Clinical Impairments Affecting Rehab Potential none   PT Frequency 2x / week  PT Duration 2 weeks   PT Treatment/Interventions Gait training;Moist Heat;Therapeutic activities;Patient/family education;Scar mobilization;Passive range of motion;Therapeutic exercise;Manual techniques;Neuromuscular re-education;Cryotherapy;Electrical Stimulation;Other (comment)   PT Next Visit Plan Possible D/C if MD agrees.  review HEP. work on [employer] of knee flexion.   PT Home Exercise Plan current HEP   Consulted and Agree with Plan of Care Patient        Problem List Patient Active Problem List   Diagnosis Date Noted  . Osteoarthritis of left knee 01/03/2015  . Status post total left knee replacement 01/03/2015  . Preop cardiovascular exam 12/12/2014  . PAF (paroxysmal atrial fibrillation)   . Dyslipidemia   . Fever 05/03/2013    Class: Acute  . Nausea 05/03/2013    Class: Acute  . Constipation 05/03/2013    Class: Acute  . Syncope, history of 04/19/2013  . History of atrial fibrillation 04/19/2013  . Essential hypertension 04/19/2013  . Hypothyroidism 04/19/2013  . Malignant neoplasm of ovary 03/11/2013  . Elevated cancer antigen 125 (CA 125) 01/05/2013  . Degenerative arthritis of hip 02/25/2012    Cayleen Benjamin,PT 03/17/2015, 10:58 AM  [facility] [address] [place], [place], [address] Phone: [phone]   Fax:  [phone]

## 2015-03-20 ENCOUNTER — Telehealth: Payer: Self-pay | Admitting: *Deleted

## 2015-03-20 ENCOUNTER — Encounter: Payer: Self-pay | Admitting: Oncology

## 2015-03-20 ENCOUNTER — Ambulatory Visit: Payer: Medicare Other | Admitting: Physical Therapy

## 2015-03-20 ENCOUNTER — Encounter: Payer: Self-pay | Admitting: Physical Therapy

## 2015-03-20 ENCOUNTER — Other Ambulatory Visit: Payer: Self-pay | Admitting: Gynecologic Oncology

## 2015-03-20 ENCOUNTER — Telehealth: Payer: Self-pay | Admitting: Gynecologic Oncology

## 2015-03-20 DIAGNOSIS — R29898 Other symptoms and signs involving the musculoskeletal system: Secondary | ICD-10-CM

## 2015-03-20 DIAGNOSIS — R609 Edema, unspecified: Secondary | ICD-10-CM

## 2015-03-20 DIAGNOSIS — Z471 Aftercare following joint replacement surgery: Secondary | ICD-10-CM | POA: Diagnosis not present

## 2015-03-20 DIAGNOSIS — C569 Malignant neoplasm of unspecified ovary: Secondary | ICD-10-CM

## 2015-03-20 DIAGNOSIS — M24662 Ankylosis, left knee: Secondary | ICD-10-CM

## 2015-03-20 DIAGNOSIS — R269 Unspecified abnormalities of gait and mobility: Secondary | ICD-10-CM

## 2015-03-20 NOTE — Telephone Encounter (Signed)
Message left asking the patient to call the office when she is available.

## 2015-03-20 NOTE — Telephone Encounter (Signed)
Notified pt of scheduled PET scan on May 5,2016 @ 8:00am. Pt was instructed to be NPO after midnight and to arrive at 7:30 for her appointment. Pt  voiced understanding of instructions and agreed with time and date.

## 2015-03-20 NOTE — Progress Notes (Unsigned)
Medical Oncology  CA 125 from 03-14-15 higher at 35, this having been 26 on 01-31-15 and 13.4 on 11-05-15. This information given to Dr Josephina Shih, who requests PET CT. Gyn onc NP to contact patient and set up PET CT if patient agrees. Will need to be seen back after scans, possibly by Dr Josephina Shih if availability; she is presently scheduled to see me on June 17, which may need to move,   L.Marko Plume, MD

## 2015-03-20 NOTE — Progress Notes (Signed)
Patient informed of CA 125 results and Dr. Mora Bellman and Dr. Mariana Kaufman recommendations for a PET scan.  Appt will be arranged and the patient will be contacted.

## 2015-03-20 NOTE — Therapy (Signed)
[facility] [address], [place], [place], [address] Phone: [phone]   Fax:  [phone]  Physical Therapy Treatment  Patient Details  Name: [name] MRN: [id number] Date of Birth: 12-Dec-1940 Referring Provider:  Mcarthur Rossetti*  Encounter Date: 03/20/2015      PT End of Session - 03/20/15 1045    Visit Number 13   Number of Visits 20   Date for PT Re-Evaluation 03/24/15   PT Start Time 2951   PT Stop Time 1100   PT Time Calculation (min) 45 min   Activity Tolerance Patient tolerated treatment well   Behavior During Therapy [facility] for tasks assessed/performed      Past Medical History  Diagnosis Date  . Hypertension   . Dyslipidemia   . Hypothyroidism   . Arthritis     OSTEOARTHRITIS   -- CONSTANT PAIN RIGHT HIP---AND PAIN LEFT KNEE--PT STATES SHE GETS INJECTIONS INTO HER KNEE  . Ovarian cancer   . PAF (paroxysmal atrial fibrillation)     Only on Plavix -- not full Anticoagulation per pt. request  . Complication of anesthesia     BLOOD PRESSURE DROPPED WITH NASAL SURGERY, ONE OF THE CARPAL TUNNEL REPAIRS AND DURING A COLONOSCOPY  . History of skin cancer   . Constipation     Past Surgical History  Procedure Laterality Date  . Bilateral carpal tunnel repair  2007  . Dilation and curettage of uterus  1969  . Surgery for ruptured ovarian cyst  1969  . Rhinoplasty for fractured nose  1986  . Left knee arthroscopy   2011  . Total hip arthroplasty  02/25/2012    Procedure: TOTAL HIP ARTHROPLASTY ANTERIOR APPROACH;  Surgeon: Mcarthur Rossetti, MD;  Location: [facility];  Service: Orthopedics;  Laterality: Right;  . Tonsillectomy  1962  . Laparotomy Bilateral 02/13/2013    Procedure: EXPLORATORY LAPAROTOMY TOTAL ABDOMINAL HYSTERECTOMY BILATERAL SALPINGO-OOPHORECTOMY, Partial Rectal Resection with Reanastamosis;  Surgeon: Alvino Chapel, MD;  Location: [facility];  Service: Gynecology;   Laterality: Bilateral;  . Omentectomy  02/13/2013    Procedure: OMENTECTOMY;  Surgeon: Alvino Chapel, MD;  Location: [facility];  Service: Gynecology;;  . Lymphadenectomy Right 02/13/2013    Procedure: PEVLIC  LYMPHADENECTOMY, DEBULKING right pelvic tumor nodules;  Surgeon: Alvino Chapel, MD;  Location: [facility];  Service: Gynecology;  Laterality: Right;  . Total abdominal hysterectomy  01/2013  . Transthoracic echocardiogram  May 2014    Normal LV size function. EF 60-65%. Grade 1 diastolic function. Mild MR and mildly elevated PA pressures of 37 mmHg per  . Nm myoview ltd  June 2010     subbmaximal with no ischemia or infarction.  . Total knee arthroplasty Left 01/03/2015    Procedure: LEFT TOTAL KNEE ARTHROPLASTY;  Surgeon: Mcarthur Rossetti, MD;  Location: [facility];  Service: Orthopedics;  Laterality: Left;    There were no vitals filed for this visit.  Visit Diagnosis:  Decreased strength involving knee joint  Decreased range of motion of knee, left  Edema  Gait abnormality      Subjective Assessment - 03/20/15 1025    Subjective Pt reports no pain in left knee but now has a prescription for evaluation to adress her back pain.    Pertinent History Ovarian cancer; Left total knee replacement   Limitations Walking;Standing;House hold activities   How long can you stand comfortably? stood for 45 min. with difficulty in back pain.  How long can you walk comfortably? no diffilculty with left knee but her back pain is limiting her   Patient Stated Goals Walk normally   Currently in Pain? Yes   Pain Score 2    Pain Location Back   Pain Orientation Mid   Pain Descriptors / Indicators Tightness   Pain Type Chronic pain   Pain Onset More than a month ago   Pain Frequency Intermittent   Aggravating Factors  bending and twisting getting in or out of the car   Pain Relieving Factors rest, ice   Effect of Pain on Daily Activities unable to resume all of her activities     Multiple Pain Sites No            [facility] PT Assessment - 03/20/15 0001    Observation/Other Assessments   Focus on Therapeutic Outcomes (FOTO)  47%   AROM   Left Knee Extension -4   Left Knee Flexion 120   PROM   Left Knee Extension -2   Left Knee Flexion 130   Strength   Left Knee Flexion 5/5   Left Knee Extension 4+/5   Ambulation/Gait   Ambulation/Gait Assistance 7: Independent                     [facility] Adult PT Treatment/Exercise - 03/20/15 0001    Knee/Hip Exercises: Aerobic   Stationary Bike L2 x 8   Knee/Hip Exercises: Standing   Terminal Knee Extension Strengthening;Left;20 reps   Theraband Level (Terminal Knee Extension) Level 2 (Red)   Terminal Knee Extension Limitations pressing into orange ball 3 x10 with 5 sec hold   Forward Step Up 2 sets;10 reps   Rocker Board 2 minutes  DF/PF   Other Standing Knee Exercises stand on left leg with right hip marching 15x, then right hip abd 15x    Knee/Hip Exercises: Seated   Long Arc Quad Strengthening;Weights   Long Arc Quad Weight 3 lbs.                  PT Short Term Goals - 03/03/15 1059    PT SHORT TERM GOAL #1   Title left knee flexion AROM >/= 115 degrees   Time 4   Period Weeks   Status Achieved   PT SHORT TERM GOAL #2   Title left knee extension PROM >/= -8 degrees   Time 4   Period Weeks   Status Achieved           PT Long Term Goals - 03/20/15 1102    PT LONG TERM GOAL #1   Title understand how to use ice  for pain relief   Time 8   Period Weeks   Status Achieved   PT LONG TERM GOAL #2   Title ambulate in the community with no assistive device and minimal gait deficits   Time 8   Period Weeks   Status Achieved   PT LONG TERM GOAL #3   Title go up and down steps with step over step pattern and using a railing due to left knee AROM >/= 118 degrees   Time 8   Period Weeks   Status Achieved   PT LONG TERM GOAL #4   Title return to her daily activities in the  household due to left knee due to left knee strength >/=4+/5   Time 8   Period Weeks   Status Achieved  Plan - 04-12-2015 1056    Clinical Impression Statement Pt presets with increased left knee ROM and strength. Patient    PT Next Visit Plan D/C today   PT Home Exercise Plan current HEP   Consulted and Agree with Plan of Care Patient          G-Codes - 2015-04-12 1400    Functional Assessment Tool Used FOTO score is 47% limitation due to her back pain which will be addressed next week when she is evaluated   Mobility: Walking and Moving Around Current Status (Z6606) At least 40 percent but less than 60 percent impaired, limited or restricted   Mobility: Walking and Moving Around Goal Status (T0160) At least 40 percent but less than 60 percent impaired, limited or restricted      Problem List Patient Active Problem List   Diagnosis Date Noted  . Osteoarthritis of left knee 01/03/2015  . Status post total left knee replacement 01/03/2015  . Preop cardiovascular exam 12/12/2014  . PAF (paroxysmal atrial fibrillation)   . Dyslipidemia   . Fever 05/03/2013    Class: Acute  . Nausea 05/03/2013    Class: Acute  . Constipation 05/03/2013    Class: Acute  . Syncope, history of 04/19/2013  . History of atrial fibrillation 04/19/2013  . Essential hypertension 04/19/2013  . Hypothyroidism 04/19/2013  . Malignant neoplasm of ovary 03/11/2013  . Elevated cancer antigen 125 (CA 125) 01/05/2013  . Degenerative arthritis of hip 02/25/2012  Earlie Counts, PT 04-12-2015 2:08 PM   GRAY,CHERYL PTA Apr 12, 2015, 2:08 PM  [facility] [address] [place], [place], [address] Phone: [phone]   Fax:  [phone]  PHYSICAL THERAPY DISCHARGE SUMMARY  Visits from Start of Care:14 Current functional level related to goals / functional outcomes: All LTG's are met. See above for assessment of goals.     Remaining deficits: Patient is having back pain that is limiting her mobility.  Patient will be assessed next week for her back.   Education / Equipment: HEP  Plan: Patient agrees to discharge.  Patient goals were met. Patient is being discharged due to meeting the stated rehab goals. Thank you for the referral.  Earlie Counts, PT Apr 12, 2015 2:05 PM ?????

## 2015-03-21 ENCOUNTER — Ambulatory Visit
Admission: RE | Admit: 2015-03-21 | Discharge: 2015-03-21 | Disposition: A | Discharge status: Home / Self Care | Payer: Medicare Other | Source: Ambulatory Visit | Attending: Oncology | Admitting: Oncology

## 2015-03-21 DIAGNOSIS — Z1231 Encounter for screening mammogram for malignant neoplasm of breast: Secondary | ICD-10-CM

## 2015-03-24 ENCOUNTER — Encounter: Payer: Self-pay | Admitting: Physical Therapy

## 2015-03-24 ENCOUNTER — Ambulatory Visit: Payer: Medicare Other | Admitting: Physical Therapy

## 2015-03-24 ENCOUNTER — Telehealth: Payer: Self-pay | Admitting: Cardiology

## 2015-03-24 DIAGNOSIS — Z471 Aftercare following joint replacement surgery: Secondary | ICD-10-CM | POA: Diagnosis not present

## 2015-03-24 DIAGNOSIS — M545 Low back pain, unspecified: Secondary | ICD-10-CM

## 2015-03-24 MED ORDER — METOPROLOL TARTRATE 25 MG PO TABS: 37.5000 mg | ORAL_TABLET | Freq: Two times a day (BID) | ORAL | Status: DC

## 2015-03-24 NOTE — Patient Instructions (Signed)
Lifting Principles  .Maintain proper posture and head alignment. .Slide object as close as possible before lifting. .Move obstacles out of the way. .Test before lifting; ask for help if too heavy. .Tighten stomach muscles without holding breath. .Use smooth movements; do not jerk. .Use legs to do the work, and pivot with feet. .Distribute the work load symmetrically and close to the center of trunk. .Push instead of pull whenever possible.   Squat down and hold basket close to stand. Use leg muscles to do the work.    Avoid twisting or bending back. Pivot around using foot movements, and bend at knees if needed when reaching for articles.        Getting Into / Out of Bed   Lower self to lie down on one side by raising legs and lowering head at the same time. Use arms to assist moving without twisting. Bend both knees to roll onto back if desired. To sit up, start from lying on side, and use same move-ments in reverse. Keep trunk aligned with legs.    Shift weight from front foot to back foot as item is lifted off shelf.    When leaning forward to pick object up from floor, extend one leg out behind. Keep back straight. Hold onto a sturdy support with other hand.      Sit upright, hPosture Tips DO: - stand tall and erect - keep chin tucked in - keep head and shoulders in alignment - check posture regularly in mirror or large window - pull head back against headrest in car seat;  Change your position often.  Sit with lumbar support. DON'T: - slouch or slump while watching TV or reading - sit, stand or lie in one position  for too long;  Sitting is especially hard on the spine so if you sit at a desk/use the computer, then stand up often!   Copyright  VHI. All rights reserved.  Posture - Standing   Good posture is important. Avoid slouching and forward head thrust. Maintain curve in low back and align ears over shoul- ders, hips over ankles.  Pull your belly  button in toward your back bone.   Copyright  VHI. All rights reserved.  Posture - Sitting   Sit upright, head facing forward. Try using a roll to support lower back. Keep shoulders relaxed, and avoid rounded back. Keep hips level with knees. Avoid crossing legs for long periods.   Copyright  VHI. All rights reserved.    head facing forward. Try using a roll to support lower back. Keep shoulders relaxed, and avoid rounded back. Keep hips level with knees. Avoid crossing legs for long periods.  Sleeping on Back  Place pillow under knees. A pillow with cervical support and a roll around waist are also helpful. Copyright  VHI. All rights reserved.  Sleeping on Side Place pillow between knees. Use cervical support under neck and a roll around waist as needed. Copyright  VHI. All rights reserved.   Sleeping on Stomach   If this is the only desirable sleeping position, place pillow under lower legs, and under stomach or chest as needed.  Posture - Sitting   Sit upright, head facing forward. Try using a roll to support lower back. Keep shoulders relaxed, and avoid rounded back. Keep hips level with knees. Avoid crossing legs for long periods. Stand to Sit / Sit to Stand   To sit: Bend knees to lower self onto front edge of chair, then scoot back  on seat. To stand: Reverse sequence by placing one foot forward, and scoot to front of seat. Use rocking motion to stand up.   Work Height and Reach  Ideal work height is no more than 2 to 4 inches below elbow level when standing, and at elbow level when sitting. Reaching should be limited to arm's length, with elbows slightly bent.  Bending  Bend at hips and knees, not back. Keep feet shoulder-width apart.    Posture - Standing   Good posture is important. Avoid slouching and forward head thrust. Maintain curve in low back and align ears over shoul- ders, hips over ankles.  Alternating Positions   Alternate tasks and change  positions frequently to reduce fatigue and muscle tension. Take rest breaks. Computer Work   Position work to Programmer, multimedia. Use proper work and seat height. Keep shoulders back and down, wrists straight, and elbows at right angles. Use chair that provides full back support. Add footrest and lumbar roll as needed.  Getting Into / Out of Car  Lower self onto seat, scoot back, then bring in one leg at a time. Reverse sequence to get out.  Dressing  Lie on back to pull socks or slacks over feet, or sit and bend leg while keeping back straight.    Housework - Sink  Place one foot on ledge of cabinet under sink when standing at sink for prolonged periods.   Pushing / Pulling  Pushing is preferable to pulling. Keep back in proper alignment, and use leg muscles to do the work.  Deep Squat   Squat and lift with both arms held against upper trunk. Tighten stomach muscles without holding breath. Use smooth movements to avoid jerking.  Avoid Twisting   Avoid twisting or bending back. Pivot around using foot movements, and bend at knees if needed when reaching for articles.  Carrying Luggage   Distribute weight evenly on both sides. Use a cart whenever possible. Do not twist trunk. Move body as a unit.   Lifting Principles .Maintain proper posture and head alignment. .Slide object as close as possible before lifting. .Move obstacles out of the way. .Test before lifting; ask for help if too heavy. .Tighten stomach muscles without holding breath. .Use smooth movements; do not jerk. .Use legs to do the work, and pivot with feet. .Distribute the work load symmetrically and close to the center of trunk. .Push instead of pull whenever possible.   Ask For Help   Ask for help and delegate to others when possible. Coordinate your movements when lifting together, and maintain the low back curve.  Log Roll   Lying on back, bend left knee and place left arm across chest. Roll all in  one movement to the right. Reverse to roll to the left. Always move as one unit. Housework - Sweeping  Use long-handled equipment to avoid stooping.   Housework - Wiping  Position yourself as close as possible to reach work surface. Avoid straining your back.  Laundry - Unloading Wash   To unload small items at bottom of washer, lift leg opposite to arm being used to reach.  [place] close to area to be raked. Use arm movements to do the work. Keep back straight and avoid twisting.     Cart  When reaching into cart with one arm, lift opposite leg to keep back straight.   Getting Into / Out of Bed  Lower self to lie down on one side by raising  legs and lowering head at the same time. Use arms to assist moving without twisting. Bend both knees to roll onto back if desired. To sit up, start from lying on side, and use same move-ments in reverse. Housework - Vacuuming  Hold the vacuum with arm held at side. Step back and forth to move it, keeping head up. Avoid twisting.   Laundry - IT consultant so that bending and twisting can be avoided.   Laundry - Unloading Dryer  Squat down to reach into clothes dryer or use a reacher.  Gardening - Weeding / Probation officer or Kneel. Knee pads may be helpful.

## 2015-03-24 NOTE — Telephone Encounter (Signed)
°  1. Which medications need to be refilled? Metoprolol (new dosage: 1 1/2 tab bid)  2. Which pharmacy is medication to be sent to?[facility] on [address]  3. Do they need a 30 day or 90 day supply? 90  4. Would they like a call back once the medication has been sent to the pharmacy? yes

## 2015-03-24 NOTE — Therapy (Signed)
[facility] [address] [place], [place], [address] Phone: [phone]   Fax:  [phone]  Physical Therapy Evaluation  Patient Details  Name: [name] MRN: [id number] Date of Birth: [birth year] Referring Provider:  Mcarthur Rossetti*  Encounter Date: 03/24/2015      PT End of Session - 03/24/15 1137    Visit Number 1   Date for PT Re-Evaluation 05/05/15   PT Start Time 1100   PT Stop Time 1135   PT Time Calculation (min) 35 min   Activity Tolerance Patient tolerated treatment well   Behavior During Therapy [facility] for tasks assessed/performed      Past Medical History  Diagnosis Date  . Hypertension   . Dyslipidemia   . Hypothyroidism   . Arthritis     OSTEOARTHRITIS   -- CONSTANT PAIN RIGHT HIP---AND PAIN LEFT KNEE--PT STATES SHE GETS INJECTIONS INTO HER KNEE  . Ovarian cancer   . PAF (paroxysmal atrial fibrillation)     Only on Plavix -- not full Anticoagulation per pt. request  . Complication of anesthesia     BLOOD PRESSURE DROPPED WITH NASAL SURGERY, ONE OF THE CARPAL TUNNEL REPAIRS AND DURING A COLONOSCOPY  . History of skin cancer   . Constipation     Past Surgical History  Procedure Laterality Date  . Bilateral carpal tunnel repair  2007  . Dilation and curettage of uterus  1969  . Surgery for ruptured ovarian cyst  1969  . Rhinoplasty for fractured nose  1986  . Left knee arthroscopy   2011  . Total hip arthroplasty  02/25/2012    Procedure: TOTAL HIP ARTHROPLASTY ANTERIOR APPROACH;  Surgeon: Mcarthur Rossetti, MD;  Location: [facility];  Service: Orthopedics;  Laterality: Right;  . Tonsillectomy  1962  . Laparotomy Bilateral 02/13/2013    Procedure: EXPLORATORY LAPAROTOMY TOTAL ABDOMINAL HYSTERECTOMY BILATERAL SALPINGO-OOPHORECTOMY, Partial Rectal Resection with Reanastamosis;  Surgeon: Alvino Chapel, MD;  Location: [facility];  Service: Gynecology;  Laterality: Bilateral;   . Omentectomy  02/13/2013    Procedure: OMENTECTOMY;  Surgeon: Alvino Chapel, MD;  Location: [facility];  Service: Gynecology;;  . Lymphadenectomy Right 02/13/2013    Procedure: PEVLIC  LYMPHADENECTOMY, DEBULKING right pelvic tumor nodules;  Surgeon: Alvino Chapel, MD;  Location: [facility];  Service: Gynecology;  Laterality: Right;  . Total abdominal hysterectomy  01/2013  . Transthoracic echocardiogram  May 2014    Normal LV size function. EF 60-65%. Grade 1 diastolic function. Mild MR and mildly elevated PA pressures of 37 mmHg per  . Nm myoview ltd  June 2010     subbmaximal with no ischemia or infarction.  . Total knee arthroplasty Left 01/03/2015    Procedure: LEFT TOTAL KNEE ARTHROPLASTY;  Surgeon: Mcarthur Rossetti, MD;  Location: [facility];  Service: Orthopedics;  Laterality: Left;    There were no vitals filed for this visit.  Visit Diagnosis:  Midline low back pain without sciatica - Plan: PT plan of care cert/re-cert      Subjective Assessment - 03/24/15 1106    Subjective Patient reports her lumbar pain is chronic and started years ago which started sudden onset.  Patient reports she has curvature of the spine. I had an injection 2 years ago and the pain has decreased.  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    Pertinent History Ovarian  cancer; Left total knee replacement   How long can you sit comfortably? 45 min.  with pillows to support    How long can you stand comfortably? 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    How long can you walk comfortably? better if walking slowly   Patient Stated Goals be able to bend, get back to gardening   Currently in Pain? Yes   Pain Score 6    Pain Location Back   Pain Orientation Lower   Pain Descriptors / Indicators Sharp   Pain Type Chronic pain   Pain Onset More than a month ago   Pain Frequency Intermittent   Aggravating Factors  bending over activties   Pain  Relieving Factors change positions, icey hot and tylenol   Effect of Pain on Daily Activities not able to garden   Multiple Pain Sites No            [facility] PT Assessment - 03/24/15 0001    Assessment   Medical Diagnosis Low back pain, Sciatica   Onset Date 01/28/15   Prior Therapy None   Precautions   Precautions None   Balance Screen   Has the patient fallen in the past 6 months No   Has the patient had a decrease in activity level because of a fear of falling?  No   Is the patient reluctant to leave their home because of a fear of falling?  No   Prior Function   Level of Independence Independent with basic ADLs   Observation/Other Assessments   Focus on Therapeutic Outcomes (FOTO)  53% limitation    Posture/Postural Control   Posture/Postural Control Postural limitations   Postural Limitations Rounded Shoulders;Forward head;Right pelvic obliquity   Posture Comments left hip is higher than left   AROM   Lumbar Flexion decreased by 50% with pulling   Lumbar Extension decreased by 50%   Lumbar - Right Side Bend decreased by 25%   Lumbar - Left Side Bend decreased by 25%   Palpation   Palpation tenderness located on bil. sides of Lumbar sacral area.                            PT Education - 03/24/15 1136    Education provided Yes   Education Details body mechanics for daily tasks   Person(s) Educated Patient   Methods Explanation;Demonstration;Tactile cues;Verbal cues;Handout   Comprehension Returned demonstration;Verbalized understanding          PT Short Term Goals - 03/24/15 1140    PT SHORT TERM GOAL #1   Title pain with daily activities decreased >/= 25%   Time 3   Period Weeks   Status New   PT SHORT TERM GOAL #2   Title understand correct body mechanics with daily tasks to decrease strain on the lumbar spine   Time 3   Period Weeks   Status New           PT Long Term Goals - 03/24/15 1141    PT LONG TERM GOAL #1   Title pain  with daily activities decreased >/= 50%   Time 6   Period Weeks   Status New   PT LONG TERM GOAL #2   Title ability to garden with correct body mechanics with minimal difficulty   Time 6   Period Weeks   Status New   PT LONG TERM GOAL #3   Title stand to wash dishes  with >/= 50% greater ease due to increased back endurance   Time 6   Period Weeks   Status New               Plan - 2015-04-09 1137    Clinical Impression Statement Patient is a 74 year old female with chronic lumbar pain making it difficult for daily activities that require bending.  Patient has not been able to garden in the past 2 months due to back pain.  Patient has  decreased lumbar movement with pulling  and tenderness along bilateral lumbar sacral area. Patients G-code is 53% limitation and goal is 42% limitation.    Pt will benefit from skilled therapeutic intervention in order to improve on the following deficits Decreased range of motion;Difficulty walking;Impaired flexibility;Pain;Increased muscle spasms;Decreased scar mobility;Decreased mobility;Decreased strength;Increased fascial restricitons;Decreased endurance;Increased edema;Abnormal gait   Rehab Potential Good   Clinical Impairments Affecting Rehab Potential none   PT Frequency 2x / week   PT Duration 6 weeks   PT Treatment/Interventions Gait training;Moist Heat;Therapeutic activities;Patient/family education;Scar mobilization;Passive range of motion;Therapeutic exercise;Manual techniques;Neuromuscular re-education;Cryotherapy;Electrical Stimulation;Other (comment)   PT Next Visit Plan soft tissue work, modalities as needed, back stabilization    PT Home Exercise Plan contracting abdominals correctly   Recommended Other Services None   Consulted and Agree with Plan of Care Patient          G-Codes - Apr 09, 2015 1144    Functional Assessment Tool Used FOTO score is 53% limitation with lumbar pain limiting her daily activities   Functional Limitation  Other PT primary   Mobility: Walking and Moving Around Current Status (A1655) At least 40 percent but less than 60 percent impaired, limited or restricted   Mobility: Walking and Moving Around Goal Status (V7482) At least 40 percent but less than 60 percent impaired, limited or restricted       Problem List Patient Active Problem List   Diagnosis Date Noted  . Osteoarthritis of left knee 01/03/2015  . Status post total left knee replacement 01/03/2015  . Preop cardiovascular exam 12/12/2014  . PAF (paroxysmal atrial fibrillation)   . Dyslipidemia   . Fever 05/03/2013    Class: Acute  . Nausea 05/03/2013    Class: Acute  . Constipation 05/03/2013    Class: Acute  . Syncope, history of 04/19/2013  . History of atrial fibrillation 04/19/2013  . Essential hypertension 04/19/2013  . Hypothyroidism 04/19/2013  . Malignant neoplasm of ovary 03/11/2013  . Elevated cancer antigen 125 (CA 125) 01/05/2013  . Degenerative arthritis of hip 02/25/2012    GRAY,CHERYL,PT 04-09-2015, 11:46 AM  [facility] [address] [place], [place], [address] Phone: [phone]   Fax:  [phone]

## 2015-03-24 NOTE — Telephone Encounter (Signed)
Refill submitted to patient's preferred pharmacy. Informed patient. Pt voiced understanding, no other stated concerns at this time.  

## 2015-03-28 ENCOUNTER — Telehealth: Payer: Self-pay | Admitting: *Deleted

## 2015-03-28 ENCOUNTER — Ambulatory Visit: Payer: Medicare Other | Admitting: Physical Therapy

## 2015-03-28 ENCOUNTER — Encounter: Payer: Self-pay | Admitting: Physical Therapy

## 2015-03-28 DIAGNOSIS — R29898 Other symptoms and signs involving the musculoskeletal system: Secondary | ICD-10-CM

## 2015-03-28 DIAGNOSIS — Z471 Aftercare following joint replacement surgery: Secondary | ICD-10-CM | POA: Diagnosis not present

## 2015-03-28 DIAGNOSIS — M24662 Ankylosis, left knee: Secondary | ICD-10-CM

## 2015-03-28 DIAGNOSIS — M545 Low back pain, unspecified: Secondary | ICD-10-CM

## 2015-03-28 MED ORDER — ESTRADIOL 1 MG PO TABS: 1.0000 mg | ORAL_TABLET | ORAL | Status: DC

## 2015-03-28 NOTE — Therapy (Signed)
[facility] [address] [place], [place], [address] Phone: [phone]   Fax:  [phone]  Physical Therapy Treatment  Patient Details  Name: [name] MRN: [id number] Date of Birth: [birth year] Referring Provider:  Shon Baton, MD  Encounter Date: 03/28/2015      PT End of Session - 03/28/15 0937    Visit Number 2   Number of Visits 10   Date for PT Re-Evaluation 05/05/15   PT Start Time 0845   PT Stop Time 0947   PT Time Calculation (min) 62 min   Activity Tolerance Patient tolerated treatment well   Behavior During Therapy [facility] for tasks assessed/performed      Past Medical History  Diagnosis Date  . Hypertension   . Dyslipidemia   . Hypothyroidism   . Arthritis     OSTEOARTHRITIS   -- CONSTANT PAIN RIGHT HIP---AND PAIN LEFT KNEE--PT STATES SHE GETS INJECTIONS INTO HER KNEE  . Ovarian cancer   . PAF (paroxysmal atrial fibrillation)     Only on Plavix -- not full Anticoagulation per pt. request  . Complication of anesthesia     BLOOD PRESSURE DROPPED WITH NASAL SURGERY, ONE OF THE CARPAL TUNNEL REPAIRS AND DURING A COLONOSCOPY  . History of skin cancer   . Constipation     Past Surgical History  Procedure Laterality Date  . Bilateral carpal tunnel repair  2007  . Dilation and curettage of uterus  1969  . Surgery for ruptured ovarian cyst  1969  . Rhinoplasty for fractured nose  1986  . Left knee arthroscopy   2011  . Total hip arthroplasty  02/25/2012    Procedure: TOTAL HIP ARTHROPLASTY ANTERIOR APPROACH;  Surgeon: Mcarthur Rossetti, MD;  Location: [facility];  Service: Orthopedics;  Laterality: Right;  . Tonsillectomy  1962  . Laparotomy Bilateral 02/13/2013    Procedure: EXPLORATORY LAPAROTOMY TOTAL ABDOMINAL HYSTERECTOMY BILATERAL SALPINGO-OOPHORECTOMY, Partial Rectal Resection with Reanastamosis;  Surgeon: Alvino Chapel, MD;  Location: [facility];  Service: Gynecology;  Laterality:  Bilateral;  . Omentectomy  02/13/2013    Procedure: OMENTECTOMY;  Surgeon: Alvino Chapel, MD;  [facility]: [facility];  Service: Gynecology;;  . Lymphadenectomy Right 02/13/2013    Procedure: PEVLIC  LYMPHADENECTOMY, DEBULKING right pelvic tumor nodules;  Surgeon: Alvino Chapel, MD;  Location: [facility];  Service: Gynecology;  Laterality: Right;  . Total abdominal hysterectomy  01/2013  . Transthoracic echocardiogram  May 2014    Normal LV size function. EF 60-65%. Grade 1 diastolic function. Mild MR and mildly elevated PA pressures of 37 mmHg per  . Nm myoview ltd  June 2010     subbmaximal with no ischemia or infarction.  . Total knee arthroplasty Left 01/03/2015    Procedure: LEFT TOTAL KNEE ARTHROPLASTY;  Surgeon: Mcarthur Rossetti, MD;  Location: [facility];  Service: Orthopedics;  Laterality: Left;    There were no vitals filed for this visit.  Visit Diagnosis:  Midline low back pain without sciatica  Decreased strength involving knee joint  Decreased range of motion of knee, left      Subjective Assessment - 03/28/15 0900    Subjective Pt reports was working in the yard yesterday despite her discomfort in her low back. Pt complains of her curvature in her lumbar spine.   Pertinent History Ovarian cancer; Left total knee replacement   Limitations Walking;Standing;House hold activities   How long can you sit comfortably? 45 min.  with pillows to  support    How long can you stand comfortably? 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    How long can you walk comfortably? better if walking slowly   Patient Stated Goals be able to bend, get back to gardening   Currently in Pain? Yes   Pain Score 1    Pain Location Back   Pain Orientation Lower   Pain Descriptors / Indicators Sharp   Pain Type Chronic pain   Pain Onset More than a month ago   Pain Frequency Intermittent   Aggravating Factors  staanding still is difficult, bending over activities,     Pain Relieving Factors  chaning positions, icyey hot and Tylenol   Effect of Pain on Daily Activities not able to stand and gardening is limited   Multiple Pain Sites No                         [facility] Adult PT Treatment/Exercise - 03/28/15 0001    Exercises   Exercises Lumbar   Lumbar Exercises: Stretches   Active Hamstring Stretch 3 reps;20 seconds   Single Knee to Chest Stretch 3 reps;20 seconds   Double Knee to Chest Stretch 3 reps;20 seconds   Lower Trunk Rotation 3 reps;20 seconds  each side   Active Hamstring Stretch --   Lumbar Exercises: Aerobic   Stationary Bike L1 x 57min with towel behing back   Lumbar Exercises: Machines for Strengthening   Stationary Bike --   Lumbar Exercises: Supine   Bridge 10 reps;5 seconds   Modalities   Modalities Moist Heat;Electrical Stimulation   Moist Heat Therapy   Number Minutes Moist Heat 15 Minutes   Moist Heat Location Other (comment)  low back   Electrical Stimulation   Electrical Stimulation Location low back   Electrical Stimulation Action IFC   Electrical Stimulation Parameters 80-150Hz    Electrical Stimulation Goals Pain                PT Education - 03/28/15 0934    Education provided Yes   Education Details Pelvic tilts, Single knee and both knees to chest, hasmtring stretch, bridging   Person(s) Educated Patient   Methods Explanation;Demonstration;Tactile cues;Handout   Comprehension Returned demonstration;Verbalized understanding          PT Short Term Goals - 03/24/15 1140    PT SHORT TERM GOAL #1   Title pain with daily activities decreased >/= 25%   Time 3   Period Weeks   Status New   PT SHORT TERM GOAL #2   Title understand correct body mechanics with daily tasks to decrease strain on the lumbar spine   Time 3   Period Weeks   Status New           PT Long Term Goals - 03/24/15 1141    PT LONG TERM GOAL #1   Title pain with daily activities decreased >/= 50%   Time 6   Period Weeks   Status  New   PT LONG TERM GOAL #2   Title ability to garden with correct body mechanics with minimal difficulty   Time 6   Period Weeks   Status New   PT LONG TERM GOAL #3   Title stand to wash dishes with >/= 50% greater ease due to increased back endurance   Time 6   Period Weeks   Status New  Plan - 03/28/15 0938    Clinical Impression Statement Pt able to tolerate all stretching and strengthening activities, and she will benefit from skilled Pt to help reduce lumbar tightness   Pt will benefit from skilled therapeutic intervention in order to improve on the following deficits Decreased range of motion;Difficulty walking;Impaired flexibility;Pain;Increased muscle spasms;Decreased scar mobility;Decreased mobility;Decreased strength;Increased fascial restricitons;Decreased endurance;Increased edema;Abnormal gait   Rehab Potential Good   Clinical Impairments Affecting Rehab Potential none   PT Frequency 2x / week   PT Duration 6 weeks   PT Treatment/Interventions Gait training;Moist Heat;Therapeutic activities;Patient/family education;Scar mobilization;Passive range of motion;Therapeutic exercise;Manual techniques;Neuromuscular re-education;Cryotherapy;Electrical Stimulation;Other (comment)   PT Next Visit Plan soft tissue work, modalities as needed, back stabilization    PT Home Exercise Plan contracting abdominals correctly   Consulted and Agree with Plan of Care Patient        Problem List Patient Active Problem List   Diagnosis Date Noted  . Osteoarthritis of left knee 01/03/2015  . Status post total left knee replacement 01/03/2015  . Preop cardiovascular exam 12/12/2014  . PAF (paroxysmal atrial fibrillation)   . Dyslipidemia   . Fever 05/03/2013    Class: Acute  . Nausea 05/03/2013    Class: Acute  . Constipation 05/03/2013    Class: Acute  . Syncope, history of 04/19/2013  . History of atrial fibrillation 04/19/2013  . Essential hypertension  04/19/2013  . Hypothyroidism 04/19/2013  . Malignant neoplasm of ovary 03/11/2013  . Elevated cancer antigen 125 (CA 125) 01/05/2013  . Degenerative arthritis of hip 02/25/2012    NAUMANN-HOUEGNIFIO,Jos Cygan PTA 03/28/2015, 9:56 AM  [facility] [address], [place], [place], [address] Phone: [phone]   Fax:  [phone]

## 2015-03-28 NOTE — Patient Instructions (Signed)
Bridge   Lie back, legs bent. Exhale, pressing hips up.  Repeat  10times. Do  2-3 sessions per day.  Copyright  VHI. All rights reserved.   Pelvic Tilt  Imagen a clock move from 6' o'clock to 12 oclock very gentle subtle movement. Repeat at least 10 times but you can do more. Do  2-3 sessions per day.  http://orth.exer.us/134   Copyright  VHI. All rights reserved. Knee to Chest (Flexion)   = One knee to chest   Pull knee toward chest. Feel stretch in lower back or buttock area.  Hold 20 seconds. Repeat with other knee. Repeat 3 times. Do 2-3 sessions per day. And perform with both knees to chest x 3 with 20 sec hold  http://gt2.exer.us/225   Copyright  VHI. All rights reserved.   Lower Trunk Rotation Stretch   Keeping back flat and feet together, rotate knees to left side. Turn head to opposite side  Hold 20 seconds. Repeat 3 times to each side. Do 2-3  sessions per day.  http://orth.exer.us/122   Copyright  VHI. All rights reserved.  Supine: Leg Stretch With Strap (Basic)   Lie on back with one knee bent, foot flat on floor. Hook strap around other foot. Straighten knee. Hold  20 seconds. Relax leg completely down to floor.  Repeat 3  times per session. Do 2-3 sessions per day.

## 2015-03-28 NOTE — Telephone Encounter (Signed)
Call placed to [facility] regarding denial for PA on Estrace 1 mg. CSR advised this medication is not on pt's formulary and covered under her plan. Pt may appeal this decision. Called pt informed of above information. Pt requested paperwork to appeal, pt also advised she will pay out of pocket for medication until she hears back from the appeal. Reordered pt Rx through [facility] due to [facility] not having a copy of this refill.

## 2015-03-28 NOTE — Telephone Encounter (Signed)
Call placed for Prior Authorization for medication Estrace 1 mg daily. [facility] will fax authorization back within few days.

## 2015-04-01 ENCOUNTER — Ambulatory Visit: Payer: Medicare Other | Attending: Orthopaedic Surgery

## 2015-04-01 DIAGNOSIS — C569 Malignant neoplasm of unspecified ovary: Secondary | ICD-10-CM | POA: Insufficient documentation

## 2015-04-01 DIAGNOSIS — Z471 Aftercare following joint replacement surgery: Secondary | ICD-10-CM | POA: Insufficient documentation

## 2015-04-01 DIAGNOSIS — E039 Hypothyroidism, unspecified: Secondary | ICD-10-CM | POA: Insufficient documentation

## 2015-04-01 DIAGNOSIS — I10 Essential (primary) hypertension: Secondary | ICD-10-CM | POA: Diagnosis not present

## 2015-04-01 DIAGNOSIS — Z96652 Presence of left artificial knee joint: Secondary | ICD-10-CM | POA: Insufficient documentation

## 2015-04-01 DIAGNOSIS — M545 Low back pain, unspecified: Secondary | ICD-10-CM

## 2015-04-01 DIAGNOSIS — R29898 Other symptoms and signs involving the musculoskeletal system: Secondary | ICD-10-CM | POA: Insufficient documentation

## 2015-04-01 DIAGNOSIS — R269 Unspecified abnormalities of gait and mobility: Secondary | ICD-10-CM | POA: Diagnosis not present

## 2015-04-01 DIAGNOSIS — E785 Hyperlipidemia, unspecified: Secondary | ICD-10-CM | POA: Diagnosis not present

## 2015-04-01 DIAGNOSIS — Z7902 Long term (current) use of antithrombotics/antiplatelets: Secondary | ICD-10-CM | POA: Diagnosis not present

## 2015-04-01 DIAGNOSIS — I480 Paroxysmal atrial fibrillation: Secondary | ICD-10-CM | POA: Insufficient documentation

## 2015-04-01 NOTE — Patient Instructions (Signed)
RE-ALIGNMENT ROUTINE EXERCISES-OSTEOPROROSIS BASIC FOR POSTURAL CORRECTION   RE-ALIGNMENT Tips BENEFITS: 1.It helps to re-align the curves of the back and improve standing posture. 2.It allows the back muscles to rest and strengthen in preparation for more activity. FREQUENCY: Daily, even after weeks, months and years of more advanced exercises. START: 1.All exercises start in the same position: lying on the back, arms resting on the supporting surface, palms up and slightly away from the body, backs of hands down, knees bent, feet flat. 2.The head, neck, arms, and legs are supported according to specific instructions of your therapist. Copyright  VHI. All rights reserved.    1. Decompression Exercise: Basic.   Takes compression off the vertebral bodies; increases tolerance for lying on the back; helps relieve back pain   Lie on back on firm surface, knees bent, feet flat, arms turned up, out to sides (~35 degrees). Head neck and arms supported as necessary. Time _5-15__ minutes. Surface: floor     2. Shoulder Press  Strengthens upper back extensors and scapular retractors.   Press both shoulders down. Hold _2-3__ seconds. Repeat _3-5__ times. Surface: floor    3.  Leg Lengthener: stretches quadratus lumborum and hip flexors.  Strengthens quads and ankle dorsiflexors.   Straighten one leg. Pull toes AND forefoot toward knee, extend heel. Lengthen leg by pulling pelvis away from ribs. Hold _2-3__ seconds. Relax. Repeat 1 time. Re-bend knee. Do other leg. Each leg 4-6___ times. Surface: floor  Copyright  VHI. All rights reserved.   HIP: Hamstrings - Short Sitting   Rest leg on raised surface. Keep knee straight. Lift chest. Hold _20__ seconds. _3__ reps per set, _3__ sets per day  Copyright  VHI. All rights reserved.

## 2015-04-01 NOTE — Therapy (Signed)
[facility] [address], [place], [place], [address] Phone: [phone]   Fax:  [phone]  Physical Therapy Treatment  Patient Details  Name: [name] MRN: [id number] Date of Birth: Mar 23, 1941 Referring Provider:  Shon Baton, MD  Encounter Date: 04/01/2015      PT End of Session - 04/01/15 1044    Visit Number 3   Number of Visits 10  Medicare   Date for PT Re-Evaluation 05/05/15   PT Start Time 8768   PT Stop Time 1102   PT Time Calculation (min) 48 min   Activity Tolerance Patient tolerated treatment well   Behavior During Therapy [facility] for tasks assessed/performed      Past Medical History  Diagnosis Date  . Hypertension   . Dyslipidemia   . Hypothyroidism   . Arthritis     OSTEOARTHRITIS   -- CONSTANT PAIN RIGHT HIP---AND PAIN LEFT KNEE--PT STATES SHE GETS INJECTIONS INTO HER KNEE  . Ovarian cancer   . PAF (paroxysmal atrial fibrillation)     Only on Plavix -- not full Anticoagulation per pt. request  . Complication of anesthesia     BLOOD PRESSURE DROPPED WITH NASAL SURGERY, ONE OF THE CARPAL TUNNEL REPAIRS AND DURING A COLONOSCOPY  . History of skin cancer   . Constipation     Past Surgical History  Procedure Laterality Date  . Bilateral carpal tunnel repair  2007  . Dilation and curettage of uterus  1969  . Surgery for ruptured ovarian cyst  1969  . Rhinoplasty for fractured nose  1986  . Left knee arthroscopy   2011  . Total hip arthroplasty  02/25/2012    Procedure: TOTAL HIP ARTHROPLASTY ANTERIOR APPROACH;  Surgeon: Mcarthur Rossetti, MD;  Location: [facility];  Service: Orthopedics;  Laterality: Right;  . Tonsillectomy  1962  . Laparotomy Bilateral 02/13/2013    Procedure: EXPLORATORY LAPAROTOMY TOTAL ABDOMINAL HYSTERECTOMY BILATERAL SALPINGO-OOPHORECTOMY, Partial Rectal Resection with Reanastamosis;  Surgeon: Alvino Chapel, MD;  Location: [facility];  Service: Gynecology;   Laterality: Bilateral;  . Omentectomy  02/13/2013    Procedure: OMENTECTOMY;  Surgeon: Alvino Chapel, MD;  Location: [facility];  Service: Gynecology;;  . Lymphadenectomy Right 02/13/2013    Procedure: PEVLIC  LYMPHADENECTOMY, DEBULKING right pelvic tumor nodules;  Surgeon: Alvino Chapel, MD;  Location: [facility];  Service: Gynecology;  Laterality: Right;  . Total abdominal hysterectomy  01/2013  . Transthoracic echocardiogram  May 2014    Normal LV size function. EF 60-65%. Grade 1 diastolic function. Mild MR and mildly elevated PA pressures of 37 mmHg per  . Nm myoview ltd  June 2010     subbmaximal with no ischemia or infarction.  . Total knee arthroplasty Left 01/03/2015    Procedure: LEFT TOTAL KNEE ARTHROPLASTY;  Surgeon: Mcarthur Rossetti, MD;  Location: [facility];  Service: Orthopedics;  Laterality: Left;    There were no vitals filed for this visit.  Visit Diagnosis:  Midline low back pain without sciatica  Decreased strength involving knee joint      Subjective Assessment - 04/01/15 1013    Subjective Pt reports that she has increased pain with knee to chest exercise.     Currently in Pain? Yes   Pain Score 4    Pain Location Back   Pain Orientation Lower   Pain Descriptors / Indicators Sore;Sharp   Pain Type Chronic pain   Pain Onset More than a month ago  Pain Frequency Intermittent   Aggravating Factors  working in garden, bending at hips   Pain Relieving Factors Tylenol, icy hot, not bending over   Multiple Pain Sites No                         [facility] Adult PT Treatment/Exercise - 04/01/15 0001    Lumbar Exercises: Stretches   Active Hamstring Stretch 3 reps;20 seconds  modified to perform seated   Lumbar Exercises: Aerobic   Stationary Bike L1 x 80min with towel behing back   Lumbar Exercises: Supine   Other Supine Lumbar Exercises meeks decompression exercises: scapular press, leg lengthener   Moist Heat Therapy   Number Minutes  Moist Heat 15 Minutes   Moist Heat Location Other (comment)  lumbar   Electrical Stimulation   Electrical Stimulation Location low back   Electrical Stimulation Action IFC   Electrical Stimulation Parameters 15 minutes   Electrical Stimulation Goals Pain                PT Education - 04/01/15 1039    Education provided Yes   Education Details decompression, seated hamstring stretch   Person(s) Educated Patient   Methods Explanation;Handout   Comprehension Verbalized understanding;Returned demonstration          PT Short Term Goals - 04/01/15 1015    PT SHORT TERM GOAL #1   Title pain with daily activities decreased >/= 25%   Time 3   Period Weeks   Status On-going           PT Long Term Goals - 04/01/15 1024    PT LONG TERM GOAL #1   Title pain with daily activities decreased >/= 50%   Time 6   Period Weeks   Status On-going   PT LONG TERM GOAL #2   Title ability to garden with correct body mechanics with minimal difficulty   Time 6   Period Weeks   Status On-going               Plan - 04/01/15 1024    Clinical Impression Statement Pt with continued LBP and guarded posture with all movement. pt denies any progress toward goals since starting treatment for LBP.  Pt was not able to tolerate exercise issued last session.     Pt will benefit from skilled therapeutic intervention in order to improve on the following deficits Decreased range of motion;Difficulty walking;Impaired flexibility;Pain;Increased muscle spasms;Decreased scar mobility;Decreased mobility;Decreased strength;Increased fascial restricitons;Decreased endurance;Increased edema;Abnormal gait   PT Frequency 2x / week   PT Duration 6 weeks   PT Treatment/Interventions Gait training;Moist Heat;Therapeutic activities;Patient/family education;Scar mobilization;Passive range of motion;Therapeutic exercise;Manual techniques;Neuromuscular re-education;Cryotherapy;Electrical Stimulation;Other  (comment)   PT Next Visit Plan gentle flexibility and strength for the lumbar spine as tolerated.  Body mechanics education with emphasis on gardening.     Consulted and Agree with Plan of Care Patient        Problem List Patient Active Problem List   Diagnosis Date Noted  . Osteoarthritis of left knee 01/03/2015  . Status post total left knee replacement 01/03/2015  . Preop cardiovascular exam 12/12/2014  . PAF (paroxysmal atrial fibrillation)   . Dyslipidemia   . Fever 05/03/2013    Class: Acute  . Nausea 05/03/2013    Class: Acute  . Constipation 05/03/2013    Class: Acute  . Syncope, history of 04/19/2013  . History of atrial fibrillation 04/19/2013  . Essential hypertension 04/19/2013  .  Hypothyroidism 04/19/2013  . Malignant neoplasm of ovary 03/11/2013  . Elevated cancer antigen 125 (CA 125) 01/05/2013  . Degenerative arthritis of hip 02/25/2012    Hyatt Capobianco, PT 04/01/2015, 10:49 AM  [facility] [address], [place], [place], [address] Phone: [phone]   Fax:  [phone]

## 2015-04-03 ENCOUNTER — Encounter: Payer: Self-pay | Admitting: Gynecology

## 2015-04-03 ENCOUNTER — Ambulatory Visit (HOSPITAL_COMMUNITY)
Admission: RE | Admit: 2015-04-03 | Discharge: 2015-04-03 | Disposition: A | Discharge status: Home / Self Care | Payer: Medicare Other | Source: Ambulatory Visit | Attending: Gynecologic Oncology | Admitting: Gynecologic Oncology

## 2015-04-03 ENCOUNTER — Other Ambulatory Visit: Payer: Self-pay | Admitting: Oncology

## 2015-04-03 DIAGNOSIS — R599 Enlarged lymph nodes, unspecified: Secondary | ICD-10-CM | POA: Diagnosis not present

## 2015-04-03 DIAGNOSIS — R935 Abnormal findings on diagnostic imaging of other abdominal regions, including retroperitoneum: Secondary | ICD-10-CM | POA: Diagnosis not present

## 2015-04-03 DIAGNOSIS — C569 Malignant neoplasm of unspecified ovary: Secondary | ICD-10-CM

## 2015-04-03 LAB — GLUCOSE, CAPILLARY: Glucose-Capillary: 94 mg/dL (ref 70–99)

## 2015-04-03 MED ORDER — FLUDEOXYGLUCOSE F - 18 (FDG) INJECTION
6.5400 | Freq: Once | INTRAVENOUS | Status: AC | PRN
Start: 2015-04-03 — End: 2015-04-03
  Administered 2015-04-03: 6.54 via INTRAVENOUS

## 2015-04-03 NOTE — Progress Notes (Signed)
The patient had a rising CA-125 on April 15 (35 units per mL). 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  I contacted the patient and shared this information with her. I also discussed the situation with Dr. Marko Plume and Dr. Huey Bienenstock office will contact the patient to schedule a return appointment. Given her significant platinum free interval, I would recommend she be retreated with carboplatin, Taxol, and add a Avastin to the regimen.

## 2015-04-03 NOTE — Progress Notes (Unsigned)
Patient had a repeat CA-125 value on April 15 which has now increased to 35 units per mL. 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

## 2015-04-04 ENCOUNTER — Ambulatory Visit: Payer: Medicare Other | Admitting: Physical Therapy

## 2015-04-04 ENCOUNTER — Encounter: Payer: Self-pay | Admitting: Physical Therapy

## 2015-04-04 DIAGNOSIS — M545 Low back pain, unspecified: Secondary | ICD-10-CM

## 2015-04-04 DIAGNOSIS — M24662 Ankylosis, left knee: Secondary | ICD-10-CM

## 2015-04-04 DIAGNOSIS — Z471 Aftercare following joint replacement surgery: Secondary | ICD-10-CM | POA: Diagnosis not present

## 2015-04-04 DIAGNOSIS — R29898 Other symptoms and signs involving the musculoskeletal system: Secondary | ICD-10-CM

## 2015-04-04 NOTE — Therapy (Signed)
[facility] [address], [place], [place], [address] Phone: [phone]   Fax:  [phone]  Physical Therapy Treatment  Patient Details  Name: [name] MRN: [id number] Date of Birth: 1941/11/02 Referring Provider:  Shon Baton, MD  Encounter Date: 04/04/2015      PT End of Session - 04/04/15 0928    Visit Number 4   Number of Visits 10   Date for PT Re-Evaluation 05/05/15   PT Start Time 0843   PT Stop Time 0944   PT Time Calculation (min) 61 min   Activity Tolerance Patient tolerated treatment well   Behavior During Therapy [facility] for tasks assessed/performed      Past Medical History  Diagnosis Date  . Hypertension   . Dyslipidemia   . Hypothyroidism   . Arthritis     OSTEOARTHRITIS   -- CONSTANT PAIN RIGHT HIP---AND PAIN LEFT KNEE--PT STATES SHE GETS INJECTIONS INTO HER KNEE  . Ovarian cancer   . PAF (paroxysmal atrial fibrillation)     Only on Plavix -- not full Anticoagulation per pt. request  . Complication of anesthesia     BLOOD PRESSURE DROPPED WITH NASAL SURGERY, ONE OF THE CARPAL TUNNEL REPAIRS AND DURING A COLONOSCOPY  . History of skin cancer   . Constipation     Past Surgical History  Procedure Laterality Date  . Bilateral carpal tunnel repair  2007  . Dilation and curettage of uterus  1969  . Surgery for ruptured ovarian cyst  1969  . Rhinoplasty for fractured nose  1986  . Left knee arthroscopy   2011  . Total hip arthroplasty  02/25/2012    Procedure: TOTAL HIP ARTHROPLASTY ANTERIOR APPROACH;  Surgeon: Mcarthur Rossetti, MD;  Location: [facility];  Service: Orthopedics;  Laterality: Right;  . Tonsillectomy  1962  . Laparotomy Bilateral 02/13/2013    Procedure: EXPLORATORY LAPAROTOMY TOTAL ABDOMINAL HYSTERECTOMY BILATERAL SALPINGO-OOPHORECTOMY, Partial Rectal Resection with Reanastamosis;  Surgeon: Alvino Chapel, MD;  Location: [facility];  Service: Gynecology;  Laterality:  Bilateral;  . Omentectomy  02/13/2013    Procedure: OMENTECTOMY;  Surgeon: Alvino Chapel, MD;  [facility]: [facility];  Service: Gynecology;;  . Lymphadenectomy Right 02/13/2013    Procedure: PEVLIC  LYMPHADENECTOMY, DEBULKING right pelvic tumor nodules;  Surgeon: Alvino Chapel, MD;  [facility]: [facility];  Service: Gynecology;  Laterality: Right;  . Total abdominal hysterectomy  01/2013  . Transthoracic echocardiogram  May 2014    Normal LV size function. EF 60-65%. Grade 1 diastolic function. Mild MR and mildly elevated PA pressures of 37 mmHg per  . Nm myoview ltd  June 2010     subbmaximal with no ischemia or infarction.  . Total knee arthroplasty Left 01/03/2015    Procedure: LEFT TOTAL KNEE ARTHROPLASTY;  Surgeon: Mcarthur Rossetti, MD;  Location: [facility];  Service: Orthopedics;  Laterality: Left;    There were no vitals filed for this visit.  Visit Diagnosis:  Midline low back pain without sciatica  Decreased strength involving knee joint  Decreased range of motion of knee, left      Subjective Assessment - 04/04/15 0905    Subjective Pt reports tolerated Meeks exercises well, pt needs to go back on chemotherapy for Ovarian cancer   Pertinent History Ovarian cancer; Left total knee replacement   Limitations Sitting;Walking;House hold activities   How long can you sit comfortably? 45 min.  with pillows to support    How long can you  stand comfortably? 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    How long can you walk comfortably? better if walking slowly   Patient Stated Goals be able to bend, get back to gardening   Currently in Pain? Yes   Pain Score 2    Pain Location Back   Pain Orientation Lower   Pain Descriptors / Indicators Sore;Sharp   Pain Type Chronic pain   Pain Onset More than a month ago   Pain Frequency Intermittent   Aggravating Factors  knees to chest, gardening, bending at hips   Pain Relieving Factors Tylenol, icy hot, not bending   Effect of Pain on  Daily Activities not able to stand and gardening is limited                         [facility] Adult PT Treatment/Exercise - 04/04/15 0001    Lumbar Exercises: Stretches   Active Hamstring Stretch 3 reps;20 seconds  in sitting   Lower Trunk Rotation 3 reps;20 seconds  each side   Lumbar Exercises: Aerobic   Stationary Bike L1 x 44min with towel behing back   Lumbar Exercises: Supine   Other Supine Lumbar Exercises meeks decompression exercises: scapular press, leg lengthener  shoulder, head, unable to perform LE meeks with Rt LE due to   Other Supine Lumbar Exercises yellow t-band pull overhead 2x10   Moist Heat Therapy   Number Minutes Moist Heat 20 Minutes   Moist Heat Location Other (comment)   Electrical Stimulation   Electrical Stimulation Location low back   Electrical Stimulation Action IFC   Electrical Stimulation Parameters 20   Electrical Stimulation Goals Pain                  PT Short Term Goals - 04/01/15 1015    PT SHORT TERM GOAL #1   Title pain with daily activities decreased >/= 25%   Time 3   Period Weeks   Status On-going           PT Long Term Goals - 04/01/15 1024    PT LONG TERM GOAL #1   Title pain with daily activities decreased >/= 50%   Time 6   Period Weeks   Status On-going   PT LONG TERM GOAL #2   Title ability to garden with correct body mechanics with minimal difficulty   Time 6   Period Weeks   Status On-going               Plan - 04/04/15 1443    Clinical Impression Statement Pt recieved yesterday the news that she needs to go back on chemotherapy due to ovarian cancer. Pt able to tolerate meeks exercises    Rehab Potential Good   PT Frequency 2x / week   PT Duration 6 weeks   PT Next Visit Plan gentle flexibility and strength for the lumbar spine as tolerated.  Body mechanics education with emphasis on gardening.     PT Home Exercise Plan contracting abdominals correctly   Consulted and Agree with  Plan of Care Patient        Problem List Patient Active Problem List   Diagnosis Date Noted  . Osteoarthritis of left knee 01/03/2015  . Status post total left knee replacement 01/03/2015  . Preop cardiovascular exam 12/12/2014  . PAF (paroxysmal atrial fibrillation)   . Dyslipidemia   . Fever 05/03/2013    Class: Acute  . Nausea 05/03/2013  Class: Acute  . Constipation 05/03/2013    Class: Acute  . Syncope, history of 04/19/2013  . History of atrial fibrillation 04/19/2013  . Essential hypertension 04/19/2013  . Hypothyroidism 04/19/2013  . Malignant neoplasm of ovary 03/11/2013  . Elevated cancer antigen 125 (CA 125) 01/05/2013  . Degenerative arthritis of hip 02/25/2012    NAUMANN-HOUEGNIFIO,Aniko Finnigan  PTA [facility] [address], [place], [place], [address] Phone: [phone]   Fax:  [phone]

## 2015-04-07 ENCOUNTER — Telehealth: Payer: Self-pay | Admitting: Oncology

## 2015-04-07 ENCOUNTER — Encounter: Payer: Self-pay | Admitting: Physical Therapy

## 2015-04-07 ENCOUNTER — Ambulatory Visit: Payer: Medicare Other | Admitting: Physical Therapy

## 2015-04-07 ENCOUNTER — Other Ambulatory Visit: Payer: Self-pay | Admitting: Oncology

## 2015-04-07 DIAGNOSIS — M545 Low back pain, unspecified: Secondary | ICD-10-CM

## 2015-04-07 DIAGNOSIS — Z471 Aftercare following joint replacement surgery: Secondary | ICD-10-CM | POA: Diagnosis not present

## 2015-04-07 NOTE — Patient Instructions (Addendum)
  PNF Strengthening: Resisted    lay on back with resistive band around each hand, bring right arm up and away, thumb back. Repeat _10___ times per set. Do _2___ sets per session. Do _1-2___ sessions per day.      Resisted Horizontal Abduction: Bilateral   Lay on back, tubing in both hands, arms out in front. Keeping arms straight, pinch shoulder blades together and stretch arms out. Repeat _10___ times per set. Do 2____ sets per session. Do _1-2___ sessions per day.                    Bracing With Knee Fallout (Hook-Lying)   With neutral spine, tighten pelvic floor and abdominals and hold. Alternating legs, drop knee out to side. Keep opposite hip still. Repeat _10__ times. Do _1__ times a day.   Copyright  VHI. All rights reserved.  Bracing With Bridging (Hook-Lying)   With neutral spine, tighten pelvic floor and abdominals and hold. Lift bottom. Repeat _5__ times. Do _1__ times a day.   Copyright  VHI. All rights reserved.

## 2015-04-07 NOTE — Telephone Encounter (Signed)
Patient confirmed appointment for May.

## 2015-04-07 NOTE — Therapy (Signed)
[facility] [address] [place], [place], [address] Phone: [phone]   Fax:  [phone]  Physical Therapy Treatment  Patient Details  Name: [name] MRN: [id number] Date of Birth: Apr 20, 1941 Referring Provider:  Mcarthur Rossetti*  Encounter Date: 04/07/2015      PT End of Session - 04/07/15 1158    Visit Number 5   Number of Visits 10   Date for PT Re-Evaluation 05/05/15   PT Start Time 6195   PT Stop Time 1230   PT Time Calculation (min) 45 min   Activity Tolerance Patient tolerated treatment well   Behavior During Therapy [facility] for tasks assessed/performed      Past Medical History  Diagnosis Date  . Hypertension   . Dyslipidemia   . Hypothyroidism   . Arthritis     OSTEOARTHRITIS   -- CONSTANT PAIN RIGHT HIP---AND PAIN LEFT KNEE--PT STATES SHE GETS INJECTIONS INTO HER KNEE  . Ovarian cancer   . PAF (paroxysmal atrial fibrillation)     Only on Plavix -- not full Anticoagulation per pt. request  . Complication of anesthesia     BLOOD PRESSURE DROPPED WITH NASAL SURGERY, ONE OF THE CARPAL TUNNEL REPAIRS AND DURING A COLONOSCOPY  . History of skin cancer   . Constipation     Past Surgical History  Procedure Laterality Date  . Bilateral carpal tunnel repair  2007  . Dilation and curettage of uterus  1969  . Surgery for ruptured ovarian cyst  1969  . Rhinoplasty for fractured nose  1986  . Left knee arthroscopy   2011  . Total hip arthroplasty  02/25/2012    Procedure: TOTAL HIP ARTHROPLASTY ANTERIOR APPROACH;  Surgeon: Mcarthur Rossetti, MD;  Location: [facility];  Service: Orthopedics;  Laterality: Right;  . Tonsillectomy  1962  . Laparotomy Bilateral 02/13/2013    Procedure: EXPLORATORY LAPAROTOMY TOTAL ABDOMINAL HYSTERECTOMY BILATERAL SALPINGO-OOPHORECTOMY, Partial Rectal Resection with Reanastamosis;  Surgeon: Alvino Chapel, MD;  Location: [facility];  Service: Gynecology;   Laterality: Bilateral;  . Omentectomy  02/13/2013    Procedure: OMENTECTOMY;  Surgeon: Alvino Chapel, MD;  Location: [facility];  Service: Gynecology;;  . Lymphadenectomy Right 02/13/2013    Procedure: PEVLIC  LYMPHADENECTOMY, DEBULKING right pelvic tumor nodules;  Surgeon: Alvino Chapel, MD;  Location: [facility];  Service: Gynecology;  Laterality: Right;  . Total abdominal hysterectomy  01/2013  . Transthoracic echocardiogram  May 2014    Normal LV size function. EF 60-65%. Grade 1 diastolic function. Mild MR and mildly elevated PA pressures of 37 mmHg per  . Nm myoview ltd  June 2010     subbmaximal with no ischemia or infarction.  . Total knee arthroplasty Left 01/03/2015    Procedure: LEFT TOTAL KNEE ARTHROPLASTY;  Surgeon: Mcarthur Rossetti, MD;  Location: [facility];  Service: Orthopedics;  Laterality: Left;    There were no vitals filed for this visit.  Visit Diagnosis:  Midline low back pain without sciatica      Subjective Assessment - 04/07/15 1155    Subjective I will be having chemotherapy starting 04/21/2015.  Patient reports she wants to stop PT on 04/18/2015 so she can focus on the chemotherapy. The low back is not getting better.    Pertinent History Ovarian cancer; Left total knee replacement   Limitations Sitting;Walking;House hold activities   How long can you sit comfortably? 45 min.  with pillows to support    How long  can you stand comfortably? 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    How long can you walk comfortably? better if walking slowly   Patient Stated Goals be able to bend, get back to gardening   Currently in Pain? Yes   Pain Score 5    Pain Location Back   Pain Orientation Lower   Pain Descriptors / Indicators Sore;Sharp   Pain Type Chronic pain   Pain Onset More than a month ago   Pain Frequency Intermittent   Aggravating Factors  knees to chest, gardening, bending at hips   Pain Relieving Factors tylenol, icy hot, not bending   Effect of Pain  on Daily Activities not able to stand and gardening is limited   Multiple Pain Sites No            [facility] PT Assessment - 04/07/15 0001    Assessment   Medical Diagnosis Low back pain, Sciatica   Onset Date 01/28/15   Prior Function   Level of Independence Independent with basic ADLs   Observation/Other Assessments   Focus on Therapeutic Outcomes (FOTO)  55%   Strength   Left Knee Extension 4+/5                     [facility] Adult PT Treatment/Exercise - 04/07/15 0001    Lumbar Exercises: Aerobic   Stationary Bike L1 x 49mn with towel behing back   Lumbar Exercises: Supine   Clam 10 reps   Clam Limitations Pelvic floor contraction, breathing   Heel Slides 5 reps;Other (comment)   Heel Slides Limitations pulled on her back too much   Bridge 5 reps   Bridge Limitations monitored for pain   Other Supine Lumbar Exercises diagonal strengthening and horizontal abduction 10x with yellow band   Moist Heat Therapy   Number Minutes Moist Heat 20 Minutes   Moist Heat Location Other (comment)   Electrical Stimulation   Electrical Stimulation Location low back   Electrical Stimulation Action IFC   Electrical Stimulation Parameters 20                PT Education - 04/07/15 1211    Education provided Yes   Education Details scapular strengthening, lumbar stabilization exercises   Person(s) Educated Patient   Methods Explanation;Demonstration;Tactile cues;Verbal cues;Handout   Comprehension Returned demonstration;Verbalized understanding          PT Short Term Goals - 04/07/15 1207    PT SHORT TERM GOAL #1   Title pain with daily activities decreased >/= 25%   Time 3   Period Weeks   Status Not Met   PT SHORT TERM GOAL #2   Title understand correct body mechanics with daily tasks to decrease strain on the lumbar spine   Time 3   Period Weeks   Status Achieved           PT Long Term Goals - 04/07/15 1207    PT LONG TERM GOAL #1   Title pain with  daily activities decreased >/= 50%   Time 6   Period Weeks   Status Not Met   PT LONG TERM GOAL #3   Title stand to wash dishes with >/= 50% greater ease due to increased back endurance   Time 6   Period Weeks   Status Achieved   PT LONG TERM GOAL #4   Title return to her daily activities in the household due to left knee due to left knee strength >/=4+/5  Time 8   Period Weeks   Status Achieved               Plan - 2015-04-26 1329    Clinical Impression Statement Patient is a 74 year old female who has not made significant progress for her lumbar pain.  She feels she is standing up straighter.  She has increased left knee strength. Patient is independent with her current HEP.  Patient understands correct body mechanics to perform daily tasks to decreased strain on lumbar.  Patient will begin chemotherapy in 1-2 weeks .    Pt will benefit from skilled therapeutic intervention in order to improve on the following deficits Decreased range of motion;Difficulty walking;Impaired flexibility;Pain;Increased muscle spasms;Decreased scar mobility;Decreased mobility;Decreased strength;Increased fascial restricitons;Decreased endurance;Increased edema;Abnormal gait   Rehab Potential Good   Clinical Impairments Affecting Rehab Potential none   PT Treatment/Interventions Gait training;Moist Heat;Therapeutic activities;Patient/family education;Scar mobilization;Passive range of motion;Therapeutic exercise;Manual techniques;Neuromuscular re-education;Cryotherapy;Electrical Stimulation;Other (comment)   PT Next Visit Plan Discharge to HEP   PT Home Exercise Plan Current HEP   Consulted and Agree with Plan of Care Patient          G-Codes - 04-26-2015 1332    Functional Assessment Tool Used FOTO score is 55% limitation   Functional Limitation Other PT primary   Other PT Primary Current Status (Z6109) --   Other PT Primary Goal Status (U0454) At least 40 percent but less than 60 percent  impaired, limited or restricted   Other PT Primary Discharge Status (U9811) At least 40 percent but less than 60 percent impaired, limited or restricted      Problem List Patient Active Problem List   Diagnosis Date Noted  . Osteoarthritis of left knee 01/03/2015  . Status post total left knee replacement 01/03/2015  . Preop cardiovascular exam 12/12/2014  . PAF (paroxysmal atrial fibrillation)   . Dyslipidemia   . Fever 05/03/2013    Class: Acute  . Nausea 05/03/2013    Class: Acute  . Constipation 05/03/2013    Class: Acute  . Syncope, history of 04/19/2013  . History of atrial fibrillation 04/19/2013  . Essential hypertension 04/19/2013  . Hypothyroidism 04/19/2013  . Malignant neoplasm of ovary 03/11/2013  . Elevated cancer antigen 125 (CA 125) 01/05/2013  . Degenerative arthritis of hip 02/25/2012    [name],[name],PT 04/26/15, 1:35 PM  [facility] [address], [place], [place], [address] Phone: [phone]   Fax:  [phone]    PHYSICAL THERAPY DISCHARGE SUMMARY  Visits from Start of Care: 5 Current functional level related to goals / functional outcomes: See above for goal status. Patient met goals with exception of back pain.    Remaining deficits: Patient has back pain that is not changing.  She has increased pain with hip flexion.    Education / Equipment: HEP Plan: Patient agrees to discharge.  Patient goals were partially met. Patient is being discharged due to being pleased with the current functional level.  Thank you for the referral. Earlie Counts, PT Apr 26, 2015 1:37 PM ?????

## 2015-04-09 ENCOUNTER — Telehealth: Payer: Self-pay | Admitting: Gynecologic Oncology

## 2015-04-09 NOTE — Telephone Encounter (Signed)
Returned call to patient.  Patient had left message requesting to have her port-a-cath replaced since she had it removed after her previous chemotherapy.  She is wanting to have this done prior to her appt with Dr. Marko Plume on May 23.  Message left for patient stating that her request would be given to Dr. Marko Plume.  She is advised to call for any questions or concerns.

## 2015-04-11 ENCOUNTER — Ambulatory Visit: Payer: Medicare Other | Admitting: Physical Therapy

## 2015-04-15 ENCOUNTER — Telehealth: Payer: Self-pay | Admitting: Nurse Practitioner

## 2015-04-15 ENCOUNTER — Other Ambulatory Visit: Payer: Self-pay

## 2015-04-15 ENCOUNTER — Telehealth: Payer: Self-pay | Admitting: Cardiology

## 2015-04-15 ENCOUNTER — Encounter: Payer: Medicare Other | Admitting: Physical Therapy

## 2015-04-15 DIAGNOSIS — C569 Malignant neoplasm of unspecified ovary: Secondary | ICD-10-CM

## 2015-04-15 NOTE — Progress Notes (Signed)
Order placed for Power  [facility] for ~5-20-16as requested by patient prior to visit with Dr. Marko Plume 04-21-15 per telephone note 04-09-15  By Joylene John NP.

## 2015-04-15 NOTE — Telephone Encounter (Signed)
Wanting to have [name] to hold her plavix for 5 days prior to port placement ,needs it to start on the tomorrow , but needs it cleared by Dr. Ellyn Hack . Please call    Thanks

## 2015-04-15 NOTE — Telephone Encounter (Signed)
per pof to sch pt appt-per Georges Mouse req to print pof and she will call [facility] to get appt date for pt

## 2015-04-15 NOTE — Telephone Encounter (Signed)
Returned call to [place] at [facility] she needs clearance from Dr.Harding to hold plavix 5 days prior to port placement.Stated will need to start holding plavix 04/16/15.Stated she needed note faxed to her at fax # [phone].Message sent to [place].

## 2015-04-16 NOTE — Telephone Encounter (Signed)
Notified patient.it will okay to hold plavix for 5 days starting today for port placements  Patient verbalized understanding. Routed information to  [facility]- attn Tiffany

## 2015-04-16 NOTE — Telephone Encounter (Signed)
Yes - OK to hold x 5 days pre-procedure  [facility]

## 2015-04-18 ENCOUNTER — Other Ambulatory Visit: Payer: Self-pay | Admitting: Radiology

## 2015-04-18 ENCOUNTER — Encounter: Payer: Medicare Other | Admitting: Physical Therapy

## 2015-04-20 ENCOUNTER — Other Ambulatory Visit: Payer: Self-pay | Admitting: Oncology

## 2015-04-21 ENCOUNTER — Ambulatory Visit (HOSPITAL_BASED_OUTPATIENT_CLINIC_OR_DEPARTMENT_OTHER): Payer: Medicare Other | Admitting: Oncology

## 2015-04-21 ENCOUNTER — Other Ambulatory Visit: Payer: Self-pay | Admitting: Oncology

## 2015-04-21 ENCOUNTER — Ambulatory Visit (HOSPITAL_COMMUNITY)
Admission: RE | Admit: 2015-04-21 | Discharge: 2015-04-21 | Disposition: A | Discharge status: Home / Self Care | Payer: Medicare Other | Source: Ambulatory Visit | Attending: Oncology | Admitting: Oncology

## 2015-04-21 ENCOUNTER — Telehealth: Payer: Self-pay | Admitting: Oncology

## 2015-04-21 ENCOUNTER — Encounter: Payer: Self-pay | Admitting: Oncology

## 2015-04-21 ENCOUNTER — Other Ambulatory Visit: Payer: Medicare Other

## 2015-04-21 ENCOUNTER — Encounter (HOSPITAL_COMMUNITY): Payer: Self-pay

## 2015-04-21 ENCOUNTER — Encounter: Payer: Medicare Other | Admitting: Physical Therapy

## 2015-04-21 VITALS — BP 139/67 | HR 58 | Temp 97.4°F | Resp 18 | Ht 63.0 in | Wt 132.3 lb

## 2015-04-21 DIAGNOSIS — C561 Malignant neoplasm of right ovary: Secondary | ICD-10-CM

## 2015-04-21 DIAGNOSIS — E039 Hypothyroidism, unspecified: Secondary | ICD-10-CM

## 2015-04-21 DIAGNOSIS — C569 Malignant neoplasm of unspecified ovary: Secondary | ICD-10-CM

## 2015-04-21 DIAGNOSIS — C562 Malignant neoplasm of left ovary: Secondary | ICD-10-CM | POA: Diagnosis not present

## 2015-04-21 DIAGNOSIS — I480 Paroxysmal atrial fibrillation: Secondary | ICD-10-CM | POA: Diagnosis not present

## 2015-04-21 DIAGNOSIS — I10 Essential (primary) hypertension: Secondary | ICD-10-CM

## 2015-04-21 LAB — CBC WITH DIFFERENTIAL/PLATELET
BASOS ABS: 0 10*3/uL (ref 0.0–0.1)
BASOS PCT: 0 % (ref 0–1)
Eosinophils Absolute: 0 10*3/uL (ref 0.0–0.7)
Eosinophils Relative: 0 % (ref 0–5)
HEMATOCRIT: 40 % (ref 36.0–46.0)
Hemoglobin: 13 g/dL (ref 12.0–15.0)
Lymphocytes Relative: 29 % (ref 12–46)
Lymphs Abs: 1.6 10*3/uL (ref 0.7–4.0)
MCH: 32.3 pg (ref 26.0–34.0)
MCHC: 32.5 g/dL (ref 30.0–36.0)
MCV: 99.3 fL (ref 78.0–100.0)
Monocytes Absolute: 0.4 10*3/uL (ref 0.1–1.0)
Monocytes Relative: 8 % (ref 3–12)
Neutro Abs: 3.3 10*3/uL (ref 1.7–7.7)
Neutrophils Relative %: 63 % (ref 43–77)
Platelets: 122 10*3/uL — ABNORMAL LOW (ref 150–400)
RBC: 4.03 MIL/uL (ref 3.87–5.11)
RDW: 12.8 % (ref 11.5–15.5)
WBC: 5.4 10*3/uL (ref 4.0–10.5)

## 2015-04-21 LAB — TSH: TSH: 2.324 u[IU]/mL (ref 0.350–4.500)

## 2015-04-21 LAB — COMPREHENSIVE METABOLIC PANEL
ALBUMIN: 4.3 g/dL (ref 3.5–5.0)
ALT: 16 U/L (ref 14–54)
AST: 20 U/L (ref 15–41)
Alkaline Phosphatase: 87 U/L (ref 38–126)
Anion gap: 6 (ref 5–15)
BILIRUBIN TOTAL: 0.4 mg/dL (ref 0.3–1.2)
BUN: 13 mg/dL (ref 6–20)
CO2: 30 mmol/L (ref 22–32)
Calcium: 9.1 mg/dL (ref 8.9–10.3)
Chloride: 101 mmol/L (ref 101–111)
Creatinine, Ser: 0.48 mg/dL (ref 0.44–1.00)
GFR calc Af Amer: >60 mL/min (ref >60)
GFR calc non Af Amer: >60 mL/min (ref >60)
Glucose, Bld: 96 mg/dL (ref 65–99)
POTASSIUM: 3.4 mmol/L — AB (ref 3.5–5.1)
Sodium: 137 mmol/L (ref 135–145)
Total Protein: 7 g/dL (ref 6.5–8.1)

## 2015-04-21 LAB — PROTIME-INR
INR: 0.97 (ref 0.00–1.49)
Prothrombin Time: 13.1 seconds (ref 11.6–15.2)

## 2015-04-21 LAB — LIPID PANEL
Cholesterol: 224 mg/dL — ABNORMAL HIGH (ref 0–200)
HDL: 62 mg/dL (ref >40)
LDL CALC: 143 mg/dL — AB (ref 0–99)
Total CHOL/HDL Ratio: 3.6 RATIO
Triglycerides: 96 mg/dL (ref <150)
VLDL: 19 mg/dL (ref 0–40)

## 2015-04-21 LAB — T4, FREE: Free T4: 1.05 ng/dL (ref 0.61–1.12)

## 2015-04-21 LAB — APTT: aPTT: 36 seconds (ref 24–37)

## 2015-04-21 MED ORDER — CEFAZOLIN SODIUM-DEXTROSE 2-3 GM-% IV SOLR
INTRAVENOUS | Status: AC
  Filled 2015-04-21: qty 50

## 2015-04-21 MED ORDER — CEFAZOLIN SODIUM-DEXTROSE 2-3 GM-% IV SOLR
2.0000 g | INTRAVENOUS | Status: AC
  Administered 2015-04-21: 2 g via INTRAVENOUS

## 2015-04-21 MED ORDER — SODIUM CHLORIDE 0.9 % IV SOLN
INTRAVENOUS | Status: DC
  Administered 2015-04-21: 11:00:00 via INTRAVENOUS

## 2015-04-21 MED ORDER — MIDAZOLAM HCL 2 MG/2ML IJ SOLN
INTRAMUSCULAR | Status: AC
  Filled 2015-04-21: qty 6

## 2015-04-21 MED ORDER — LIDOCAINE-EPINEPHRINE 2 %-1:100000 IJ SOLN
INTRAMUSCULAR | Status: AC
  Filled 2015-04-21: qty 1

## 2015-04-21 MED ORDER — FENTANYL CITRATE (PF) 100 MCG/2ML IJ SOLN
INTRAMUSCULAR | Status: AC | PRN
  Administered 2015-04-21: 25 ug via INTRAVENOUS
  Administered 2015-04-21: 50 ug via INTRAVENOUS

## 2015-04-21 MED ORDER — MIDAZOLAM HCL 2 MG/2ML IJ SOLN
INTRAMUSCULAR | Status: AC | PRN
  Administered 2015-04-21: 1 mg via INTRAVENOUS
  Administered 2015-04-21 (×2): 0.5 mg via INTRAVENOUS

## 2015-04-21 MED ORDER — LIDOCAINE HCL 1 % IJ SOLN
INTRAMUSCULAR | Status: AC
  Filled 2015-04-21: qty 20

## 2015-04-21 MED ORDER — HEPARIN SOD (PORK) LOCK FLUSH 100 UNIT/ML IV SOLN
INTRAVENOUS | Status: AC
  Filled 2015-04-21: qty 5

## 2015-04-21 MED ORDER — HEPARIN SOD (PORK) LOCK FLUSH 100 UNIT/ML IV SOLN
INTRAVENOUS | Status: AC | PRN
  Administered 2015-04-21: 500 [IU]

## 2015-04-21 MED ORDER — FENTANYL CITRATE (PF) 100 MCG/2ML IJ SOLN
INTRAMUSCULAR | Status: AC
  Filled 2015-04-21: qty 4

## 2015-04-21 NOTE — Procedures (Signed)
R IJ Port cathter placement with US and fluoroscopy No complication No blood loss. See complete dictation in [facility] PACS.  

## 2015-04-21 NOTE — H&P (Signed)
Referring Physician(s): Livesay,Lennis P  Subjective: Patient familiar to IR service from prior Port-A-Cath placement on 06/04/13 with subsequent removal in November 2014. She has history of ovarian carcinoma with recent PET scan revealing recurrence of disease. She presents today for new Port-A-Cath placement for chemotherapy.   Allergies: Codeine; Flexeril; Lisinopril; Tape; and Ultram  Medications: Prior to Admission medications   Medication Sig Start Date End Date Taking? Authorizing Provider  aspirin EC 81 MG tablet Take 81 mg by mouth every morning.    Yes Historical Provider, MD  clopidogrel (PLAVIX) 75 MG tablet TAKE 1 TABLET BY MOUTH AT BEDTIME 03/14/15  Yes Leonie Man, MD  desonide (DESOWEN) 0.05 % cream Apply 1 application topically 2 (two) times daily as needed (rash).  12/23/14  Yes Historical Provider, MD  docusate sodium (COLACE) 100 MG capsule Take 100 mg by mouth 2 (two) times daily.   Yes Historical Provider, MD  estradiol (ESTRACE) 1 MG tablet Take 1 tablet (1 mg total) by mouth every morning. PT STATES SHE WANTS ESTRACE--NOT THE GENERIC 03/28/15  Yes Marti Sleigh, MD  Glucosamine HCl (GLUCOSAMINE PO) Take 1,000 mg by mouth every morning.    Yes Historical Provider, MD  levothyroxine (SYNTHROID, LEVOTHROID) 88 MCG tablet Take 88 mcg by mouth every morning. Takes on an empty stomach. PT STATES SHE NEEDS THE SYNTHROID--DOES NOT WANT THE GENERIC   Yes Historical Provider, MD  Menthol, Topical Analgesic, (ICY HOT EX) Apply 1 application topically daily as needed (pain.).   Yes Historical Provider, MD  metoprolol tartrate (LOPRESSOR) 25 MG tablet Take 1.5 tablets (37.5 mg total) by mouth 2 (two) times daily. 03/24/15  Yes Leonie Man, MD  Multiple Vitamin (MULTIVITAMIN WITH MINERALS) TABS tablet Take 1 tablet by mouth every morning.    Yes Historical Provider, MD  Polyethyl Glycol-Propyl Glycol (SYSTANE OP) Apply 1 drop to eye 2 (two) times daily.   Yes  Historical Provider, MD  polyethylene glycol (MIRALAX / GLYCOLAX) packet Take 17 g by mouth every morning.    Yes Historical Provider, MD  potassium chloride (K-DUR) 10 MEQ tablet Take 10 mEq by mouth every Monday, Wednesday, and Friday. 05/05/13  Yes Shon Baton, MD  propafenone (RYTHMOL) 225 MG tablet Take 1 tablet (225 mg total) by mouth 2 (two) times daily. 12/12/14  Yes Leonie Man, MD  Wheat Dextrin [facility]) POWD Take 2 scoop by mouth every morning.    Yes Historical Provider, MD  methocarbamol (ROBAXIN) 500 MG tablet Take 1 tablet (500 mg total) by mouth every 6 (six) hours as needed for muscle spasms. Patient not taking: Reported on 02/07/2015 01/06/15   Mcarthur Rossetti, MD     Vital Signs: BP 158/73 mmHg  Pulse 59  Temp(Src) 97.6 F (36.4 C) (Oral)  Resp 18  SpO2 99%  Physical Exam patient awake, alert. Chest clear to auscultation. Prior Port-A-Cath site right upper chest clean, dry, nontender, no erythema. Heart with regular rate and rhythm ;abdomen soft, positive bowel sounds, nontender. Extremities with full range of motion, trace left lower extremity edema   Imaging: No results found.  Labs:  CBC:  Recent Labs  12/30/14 1105 01/04/15 0515 01/05/15 0515 01/06/15 0510  WBC 4.7 8.0 7.7 9.5  HGB 13.2 9.4* 8.8* 9.3*  HCT 39.2 26.7* 25.7* 27.6*  PLT 139* 112* 134* 149*    COAGS:  Recent Labs  12/30/14 1105  INR 0.99  APTT 38*    BMP:  Recent Labs  05/06/14 1000  11/04/14 1304 12/30/14 1105 01/04/15 0515  NA 138 140 139 141  K 3.8 4.2 3.9 3.8  CL  --   --  102 102  CO2 29 30* 32 32  GLUCOSE 86 80 80 104*  BUN 11.1 10.8 10 9   CALCIUM 9.1 9.3 9.3 8.6  CREATININE 0.7 0.7 0.47* 0.50  GFRNONAA  --   --  >90 >90  GFRAA  --   --  >90 >90    LIVER FUNCTION TESTS:  Recent Labs  05/06/14 1000 11/04/14 1304  BILITOT 0.71 0.48  AST 19 19  ALT 19 17  ALKPHOS 72 88  PROT 6.7 6.5  ALBUMIN 3.8 3.9    Assessment and Plan: Patient familiar  to IR service from prior Port-A-Cath placement on 06/04/13 with subsequent removal in November 2014. She has history of ovarian carcinoma with recent PET scan revealing recurrence of disease. She presents today for new Port-A-Cath placement for chemotherapy.Risks and benefits discussed with the patient/hisband including, but not limited to bleeding, infection, pneumothorax, or fibrin sheath development and need for additional procedures. All of the patient's questions were answered, patient is agreeable to proceed. Consent signed and in chart.     Signed: D. Rowe Robert 04/21/2015, 11:07 AM   I spent a total of 15 minutes in face to face in clinical consultation/evaluation, greater than 50% of which was counseling/coordinating care for Port-A-Cath placement

## 2015-04-21 NOTE — Progress Notes (Signed)
OFFICE PROGRESS NOTE   Apr 21, 2015   Physicians:D. 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  She is asymptomatic, the recurrence identified from gradual increase in CA125 marker and PET 04-03-15. She saw Dr Josephina Shih in March and spoke with him by phone following the PET. Recommendation is for carboplatin taxol with addition of avastin. She had PAC placed by IR prior to this visit today.  She will have physical exam by Dr Virgina Jock in June.   Patient is a little sore from the [facility] placement this AM, without SOB.  She had left total knee arthroplasty by Dr Zollie Beckers on 01-03-15, reports a fib with that surgery.  Recovered well, PT completed, much more mobile and comfortable now.  She has otherwise felt entirely well, with good energy, good appetite, no abdominal or pelvic pain, bowels moving regularly, no bladder complaints. She has no residual peripheral neuropathy from adjuvant chemotherapy but does have chronic numbness left great toe.   She had PAC for initial chemotherapy, that removed in 09-2013.  PAC placed by IR 04-21-15 No genetics counseling located in this EMR following patient visit today.  She plans beach vacation with family June 18 - 25, which we will need to work around.   ONCOLOGIC HISTORY Patient presented to Dr Abbie Sons in late Jan 2014 after bright red spotting that AM following 2 weeks of spotting in early Dec 2015. with uterus larger than had been apparent on exam in Oct 2013. Sonohystogram 01-05-13 showed uterus normal size and echotexture, endometrium 4.3 mm, left ovary normal and right adnexa with 1.1 x 8.4.x8.2 cm cystic and solid mass. Endometrial biopsy benign and CA 125  also on 01-05-13 was 178.8. She had CT AP 01-17-13 with 1.0 x 6.9 x 8.9 cm complex right ovarian mass, no ascites, small retroperitoneal nodes. She was seen by Dr Skeet Latch on 01-18-13 and taken to surgery by Dr Josephina Shih on 02-13-13, which was TAH/BSO/ omentectomy/ureterolysis/ resection of cul-de-sac tumor/right pelvic lymphadnectomy and resection of rectum with reanastomosis. At completion of surgery there was no gross residual disease. 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 [place] [phone]) had rare clusters of atypical cells. Chemotherapy with dose dense taxol carboplatin was begun day 1 cycle 1 on 03-20-13; ANC was 1.1 on day 15 cycle 1 with taxol given and neupogen added 04-04-13. She had day 1 cycle 2 treatment on 04-17-13, then was briefly hospitalized 5-22 to 04-20-13 after syncopal episode, with antihypertensive agents DCd and UTI treated. She was feeling much better at time of "day 8" cycle 2 on 05-01-13 and did have neupogen 300 mcg x 1 dose on 05-02-13. She was readmitted to hospital 6-5 thru 05-05-13 with fever, empirically on antibiotics and blood cultures negative. She then went on planned beach vacation such that day 1 cycle 3 was administered on 05-22-13, with neupogen. Day 8 cycle 4 was delayed due to low ANC. Cycle 6 completed 08-14-13. CT AP 09-25-13 had no evidence of cancer and CA 125 was 18.3 on 07-24-2013. Marker was 20 in early Dec 2015, 26 on 01-31-15 and  35 on 03-14-15. PET 04-03-15 documented disease bilateral pelvic sidewalls, vaginal cuff and scattered areas thru abdomen/ pelvis, as well as area of uptake in spleen.     Review of systems as above, also: No fever or symptoms of infection. No SOB or other respiratory symptoms. No LE  swelling. Good appetite. No bleeding. Remainder of 10 point Review of Systems negative.  Objective:  Vital signs in last 24 hours:  BP 139/67 mmHg  Pulse 58  Temp(Src) 97.4 F (36.3 C) (Oral)  Resp 18  Ht 5\' 3"  (1.6 m)  Wt 132 lb 4.8 oz (60.011 kg)  BMI 23.44 kg/m2  SpO2 100% Weight down 3 lbs from Dec Alert, oriented and appropriate. In [facility] since [facility] placement today, but has been ambulatory without difficulty post knee replacement.  No alopecia  HEENT:PERRL, sclerae not icteric. Oral mucosa moist without lesions, posterior pharynx clear.  Neck supple. No JVD.  Lymphatics:no cervical,supraclavicular or inguinal adenopathy Resp: clear to auscultation bilaterally and normal percussion bilaterally Cardio: regular rate and rhythm. No gallop. GI: soft, nontender, not distended, no mass or organomegaly. Normally active bowel sounds. Surgical incision not remarkable. Musculoskeletal/ Extremities: without pitting edema, cords, tenderness. Surgical scar left knee not remarkable.  Neuro: no peripheral neuropathy. Otherwise nonfocal. PSYCH appropriate mood and affect Skin without rash, ecchymosis, petechiae New portacath right anterior chest- dressings clean and intact, no surrounding bruising, left neck not remarkable  Lab Results:  Results for orders placed or performed during the hospital encounter of 04/21/15  APTT  Result Value Ref Range   aPTT 36 24 - 37 seconds  CBC with Differential/Platelet  Result Value Ref Range   WBC 5.4 4.0 - 10.5 K/uL   RBC 4.03 3.87 - 5.11 MIL/uL   Hemoglobin 13.0 12.0 - 15.0 g/dL   HCT 40.0 36.0 - 46.0 %   MCV 99.3 78.0 - 100.0 fL   MCH 32.3 26.0 - 34.0 pg   MCHC 32.5 30.0 - 36.0 g/dL   RDW 12.8 11.5 - 15.5 %   Platelets 122 (L) 150 - 400 K/uL   Neutrophils Relative % 63 43 - 77 %   Neutro Abs 3.3 1.7 - 7.7 K/uL   Lymphocytes Relative 29 12 - 46 %   Lymphs Abs 1.6 0.7 - 4.0 K/uL   Monocytes Relative 8 3 - 12 %   Monocytes Absolute 0.4 0.1 -  1.0 K/uL   Eosinophils Relative 0 0 - 5 %   Eosinophils Absolute 0.0 0.0 - 0.7 K/uL   Basophils Relative 0 0 - 1 %   Basophils Absolute 0.0 0.0 - 0.1 K/uL  Protime-INR  Result Value Ref Range   Prothrombin Time 13.1 11.6 - 15.2 seconds   INR 0.97 0.00 - 1.49  Lipid panel  Result Value Ref Range   Cholesterol 224 (H) 0 - 200 mg/dL   Triglycerides 96 <150 mg/dL   HDL 62 >40 mg/dL   Total CHOL/HDL Ratio 3.6 RATIO   VLDL 19 0 - 40 mg/dL   LDL Cholesterol 143 (H) 0 - 99 mg/dL  Comprehensive metabolic panel  Result Value Ref Range   Sodium 137 135 - 145 mmol/L   Potassium 3.4 (L) 3.5 - 5.1 mmol/L   Chloride 101 101 - 111 mmol/L   CO2 30 22 - 32 mmol/L   Glucose, Bld 96 65 - 99 mg/dL   BUN 13 6 - 20 mg/dL   Creatinine, Ser 0.48 0.44 - 1.00 mg/dL   Calcium 9.1 8.9 - 10.3 mg/dL  Total Protein 7.0 6.5 - 8.1 g/dL   Albumin 4.3 3.5 - 5.0 g/dL   AST 20 15 - 41 U/L   ALT 16 14 - 54 U/L   Alkaline Phosphatase 87 38 - 126 U/L   Total Bilirubin 0.4 0.3 - 1.2 mg/dL   GFR calc non Af Amer >60 >60 mL/min   GFR calc Af Amer >60 >60 mL/min   Anion gap 6 5 - 15  TSH  Result Value Ref Range   TSH 2.324 0.350 - 4.500 uIU/mL  T4, free  Result Value Ref Range   Free T4 1.05 0.61 - 1.12 ng/dL    Lipids and thyoid studies were done at request of Dr Virgina Jock   Studies/Results: NUCLEAR MEDICINE PET SKULL BASE TO [place]  04-03-15  COMPARISON: Multiple exams, including 09/25/2013 and 04/19/2013  FINDINGS: NECK  Generalized increase thyroid activity but with focally nodular increased activity in the right lobe, maximum standard uptake value 7.1. The  CHEST  No hypermetabolic mediastinal or hilar nodes. No suspicious pulmonary nodules on the CT data.  ABDOMEN/PELVIS  The hypodense mass in the medial spleen, 4.1 by 2.8 cm, maximum standard uptake value 11.9.  Bilateral pelvic sidewall adenopathy along with suspected peritoneal implants of disease along the lower pelvis. A site  wall node or implant measures 1.4 by 1.7 cm on image 141 of series 4 with maximum standard uptake value 11.4, and a right pelvic sidewall lymph node measuring 1.3 cm in short axis has a maximum standard uptake value of 9.4. A focus of soft tissue density along the left margin of the vaginal cuff measuring 1.2 cm in short axis has a maximum standard uptake value of 7.1.  There several small omental implants of tumor. For example, a small all mental soft tissue density measuring 0.5 by 1.0 cm on image 125 of the CT data has a maximum standard uptake value of 2.5. There is also an 8 mm in short axis aortocaval lymph node on image 112 of series 4 with a maximum standard uptake value of 5.0.  Right hip implant. Atherosclerosis.  SKELETON  Focally high activity between the spinous processes of L3 and L4, maximum standard uptake value 3.7, probably inflammatory/degenerative.  IMPRESSION: 1. Highly hypermetabolic height bowed dense mass in the medial spleen compatible with malignancy. 2. Bilateral pelvic sidewall adenopathy, right greater than left, hypermetabolic, with small peritoneal implants of tumor in the lower pelvis, and with several faint but suspected omental implants of tumor. There is also a hypermetabolic aortocaval lymph node in the retroperitoneum. 3. Generalized increased thyroid activity could be due to thyroiditis. Although a well-defined nodule is not seen in the right thyroid lobe, there is focal increased hypermetabolic activity in the right thyroid lobe. Thyroid ultrasound is recommended for further investigation. 4. I suspect that the high activity between the spinous processes of L3 and L4 is degenerative.    Images in PACs for PET and correlating CT images reviewed with patient and husband now.     Ir Fluoro Guide Cv Line Left  04/21/2015   CLINICAL DATA:  Recurrent ovarian carcinoma, needs long-term access for chemotherapy. Previously placed right  IJ port catheter had worked well before its removal.  EXAM: TUNNELED PORT CATHETER PLACEMENT WITH ULTRASOUND AND FLUOROSCOPIC GUIDANCE  FLUOROSCOPY TIME:  0.3 minutes, 35.54 uGym2 DAP  ANESTHESIA/SEDATION: Intravenous Fentanyl and Versed were administered as conscious sedation during continuous cardiorespiratory monitoring by the radiology RN, with a total moderate sedation time of 12 minutes.  TECHNIQUE:  The procedure, risks, benefits, and alternatives were explained to the patient. Questions regarding the procedure were encouraged and answered. The patient understands and consents to the procedure. As antibiotic prophylaxis, cefazolin 2 g was ordered pre-procedure and administered intravenously within one hour of incision. Patency of the right IJ vein was confirmed with ultrasound with image documentation. An appropriate skin site was determined. Skin site was marked. Region was prepped using maximum barrier technique including cap and mask, sterile gown, sterile gloves, large sterile sheet, and Chlorhexidine as cutaneous antisepsis. The region was infiltrated locally with 1% lidocaine. Under real-time ultrasound guidance, the right IJ vein was accessed with a 21 gauge micropuncture needle; the needle tip within the vein was confirmed with ultrasound image documentation. Needle was exchanged over a 018 guidewire for transitional dilator which allowed passage of the [facility] wire into the IVC. Over this, the transitional dilator was exchanged for a 5 [place] MPA catheter. A small incision was made on the right anterior chest wall and a subcutaneous pocket fashioned. The power-injectable port was positioned and its catheter tunneled to the right IJ dermatotomy site. The MPA catheter was exchanged over an Amplatz wire for a peel-away sheath, through which the port catheter, which had been trimmed to the appropriate length, was advanced and positioned under fluoroscopy with its tip at the cavoatrial junction. Spot chest  radiograph confirms good catheter position and no pneumothorax. The pocket was closed with deep interrupted and subcuticular continuous 3-0 Monocryl sutures. The port was flushed per protocol. The incisions were covered with Dermabond then covered with a sterile dressing.  COMPLICATIONS: COMPLICATIONS None immediate  IMPRESSION: Technically successful right IJ power-injectable port catheter placement. Ready for routine use.   Electronically Signed   By: Lucrezia Europe M.D.   On: 04/21/2015 13:09    Medications: I have reviewed the patient's current medications. She used zofran and ativan as antiemetics for adjuvant chemotherapy; she will need decadron 20 mg 12 hrs and 6 hrs prior to taxol. EMLA for PAC On ASA and plavix for cardiology indications, will need to be aware if platelets down with chemo. After patient's visit, I noted that she continues on estrogen, which she has taken for years and which she refused previously to discontinue. I did not address this at visit today.  Pharmacy:  [facility] [address]  DISCUSSION: we have discussed information above concerning recurrent ovarian cancer, which is platinum sensitive based on time from completion of adjuvant chemotherapy. We have discussed recommendation for re-treatment with carboplatin and taxol, with addition of avastin. She has been given written information concerning avastin and we have briefly discussed mechanism of action and need to wait prior to start of avastin for wound healing. Plan full avastin teaching and discussion prior to probable addition of this agent with cycle 2.  We have discussed alternate dosing regimens of carboplatin and taxol. She had dose dense regimen with adjuvant therapy, and required granix and additional IVF support (granix x3 after day 1 and x 1-2 after days 8 and 15). She was not as strong overall when adjuvant chemotherapy was initiated, having just had surgery, and may tolerate every 3 week treatment adequately now. She and  husband think that less frequent treatments may be easier overall. Note she did lose hair even with weekly taxol.   As she had 6 cycles carboplatin with adjuvant chemotheray, she will need skin tests prior to each carbo dose.    Assessment/Plan:  1.Recurrent ovarian carcinoma: IIB poorly differentiated serous involving bilateral ovaries  at optimal radical debulking 02-13-13, followed by 6 cycles of adjuvant dose dense carboplatin taxol thru 08-14-2013, with gradually increasing CA125 March and April 2016 and PET evidence of involvement pelvis and abdomen which is asymptomatic. Plan to resume chemotherapy with carboplatin and taxol when preauthorization obtained; note patient perfers Mondays (office closed Mon 5-30 for [facility]) and beach vacation June 18-25. I have spoken with chemo scheduler now and message sent for preauthorization chemo and granix (this chosen due to possible taxol aches, may change to neulasta later). Possible addition of avastin starting cycle 2. 2.PAC placement today: delay start of avastin until cycle 2 3.left knee replacement 12-2014, doing well 4.HTN, elevated lipids, paroxysmal Afib: followed by Dr Ellyn Hack of cardiology. 5.previous constipation requiring increased laxatives with adjuvant chemo, up to date on colonoscopy 6.hypothyroid on replacement 7.right hip replacement 2013 also for osteoarthritis 8. Long estrogen replacement, which she still uses. She has previously refused to discontinue the estrogen. Initial surgical path from 02-13-13 does not report ER PR testing. 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 Breast tissue heterogeneously dense by last tomo mammograms at [facility] 03-21-15, those mammograms otherwise negative.   Patient and husband given opportunities to ask all questions now. Verbal consent for treatment given. Chemo orders including carbo skin test and granix orders entered for preauthorization. IVF ~ day 2. Cc this note including labs to Dr  Virgina Jock, and to Dr Josephina Shih.    LIVESAY,LENNIS P, MD   04/21/2015, 3:04 PM

## 2015-04-21 NOTE — Discharge Instructions (Signed)
Implanted Port Insertion, Care After Refer to this sheet in the next few weeks. These instructions provide you with information on caring for yourself after your procedure. Your health care provider may also give you more specific instructions. Your treatment has been planned according to current medical practices, but problems sometimes occur. Call your health care provider if you have any problems or questions after your procedure. WHAT TO EXPECT AFTER THE PROCEDURE After your procedure, it is typical to have the following:   Discomfort at the port insertion site. Ice packs to the area will help.  Bruising on the skin over the port. This will subside in 3-4 days. HOME CARE INSTRUCTIONS  After your port is placed, you will get a manufacturer's information card. The card has information about your port. Keep this card with you at all times.   Know what kind of port you have. There are many types of ports available.   Wear a medical alert bracelet in case of an emergency. This can help alert health care workers that you have a port.   The port can stay in for as long as your health care provider believes it is necessary.   A home health care nurse may give medicines and take care of the port.   You or a family member can get special training and directions for giving medicine and taking care of the port at home.  SEEK MEDICAL CARE IF:   Your port does not flush or you are unable to get a blood return.   You have a fever or chills. SEEK IMMEDIATE MEDICAL CARE IF:  You have new fluid or pus coming from your incision.   You notice a bad smell coming from your incision site.   You have swelling, pain, or more redness at the incision or port site.   You have chest pain or shortness of breath. Document Released: 09/05/2013 Document Revised: 11/20/2013 Document Reviewed: 09/05/2013 [facility] Patient Information 2015 [place], [place]. This information is not intended to replace  advice given to you by your health care provider. Make sure you discuss any questions you have with your health care provider.  May remove dressing and shower 24 to 48 hours post procedure. Keep incisions clean and dry.  Implanted [facility] Guide An implanted port is a type of central line that is placed under the skin. Central lines are used to provide IV access when treatment or nutrition needs to be given through a person's veins. Implanted ports are used for long-term IV access. An implanted port may be placed because:   You need IV medicine that would be irritating to the small veins in your hands or arms.   You need long-term IV medicines, such as antibiotics.   You need IV nutrition for a long period.   You need frequent blood draws for lab tests.   You need dialysis.  Implanted ports are usually placed in the chest area, but they can also be placed in the upper arm, the abdomen, or the leg. An implanted port has two main parts:   Reservoir. The reservoir is round and will appear as a small, raised area under your skin. The reservoir is the part where a needle is inserted to give medicines or draw blood.   Catheter. The catheter is a thin, flexible tube that extends from the reservoir. The catheter is placed into a large vein. Medicine that is inserted into the reservoir goes into the catheter and then into the vein.  HOW WILL I CARE FOR MY INCISION SITE? Do not get the incision site wet. Bathe or shower as directed by your health care provider.  HOW IS MY PORT ACCESSED? Special steps must be taken to access the port:   Before the port is accessed, a numbing cream can be placed on the skin. This helps numb the skin over the port site.   Your health care provider uses a sterile technique to access the port.  Your health care provider must put on a mask and sterile gloves.  The skin over your port is cleaned carefully with an antiseptic and allowed to dry.  The port is  gently pinched between sterile gloves, and a needle is inserted into the port.  Only "non-coring" port needles should be used to access the port. Once the port is accessed, a blood return should be checked. This helps ensure that the port is in the vein and is not clogged.   If your port needs to remain accessed for a constant infusion, a clear (transparent) bandage will be placed over the needle site. The bandage and needle will need to be changed every week, or as directed by your health care provider.   Keep the bandage covering the needle clean and dry. Do not get it wet. Follow your health care provider's instructions on how to take a shower or bath while the port is accessed.   If your port does not need to stay accessed, no bandage is needed over the port.  WHAT IS FLUSHING? Flushing helps keep the port from getting clogged. Follow your health care provider's instructions on how and when to flush the port. Ports are usually flushed with saline solution or a medicine called heparin. The need for flushing will depend on how the port is used.   If the port is used for intermittent medicines or blood draws, the port will need to be flushed:   After medicines have been given.   After blood has been drawn.   As part of routine maintenance.   If a constant infusion is running, the port may not need to be flushed.  HOW LONG WILL MY PORT STAY IMPLANTED? The port can stay in for as long as your health care provider thinks it is needed. When it is time for the port to come out, surgery will be done to remove it. The procedure is similar to the one performed when the port was put in.  WHEN SHOULD I SEEK IMMEDIATE MEDICAL CARE? When you have an implanted port, you should seek immediate medical care if:   You notice a bad smell coming from the incision site.   You have swelling, redness, or drainage at the incision site.   You have more swelling or pain at the port site or the  surrounding area.   You have a fever that is not controlled with medicine. Document Released: 11/15/2005 Document Revised: 09/05/2013 Document Reviewed: 07/23/2013 [facility] Patient Information 2015 [place], [place]. This information is not intended to replace advice given to you by your health care provider. Make sure you discuss any questions you have with your health care provider. Conscious Sedation Sedation is the use of medicines to promote relaxation and relieve discomfort and anxiety. Conscious sedation is a type of sedation. Under conscious sedation you are less alert than normal but are still able to respond to instructions or stimulation. Conscious sedation is used during short medical and dental procedures. It is milder than deep sedation or general  anesthesia and allows you to return to your regular activities sooner.  LET [facility] CARE PROVIDER KNOW ABOUT:   Any allergies you have.  All medicines you are taking, including vitamins, herbs, eye drops, creams, and over-the-counter medicines.  Use of steroids (by mouth or creams).  Previous problems you or members of your family have had with the use of anesthetics.  Any blood disorders you have.  Previous surgeries you have had.  Medical conditions you have.  Possibility of pregnancy, if this applies.  Use of cigarettes, alcohol, or illegal drugs. RISKS AND COMPLICATIONS Generally, this is a safe procedure. However, as with any procedure, problems can occur. Possible problems include:  Oversedation.  Trouble breathing on your own. You may need to have a breathing tube until you are awake and breathing on your own.  Allergic reaction to any of the medicines used for the procedure. BEFORE THE PROCEDURE  You may have blood tests done. These tests can help show how well your kidneys and liver are working. They can also show how well your blood clots.  A physical exam will be done.  Only take medicines as directed by  your health care provider. You may need to stop taking medicines (such as blood thinners, aspirin, or nonsteroidal anti-inflammatory drugs) before the procedure.   Do not eat or drink at least 6 hours before the procedure or as directed by your health care provider.  Arrange for a responsible adult, family member, or friend to take you home after the procedure. He or she should stay with you for at least 24 hours after the procedure, until the medicine has worn off. PROCEDURE   An intravenous (IV) catheter will be inserted into one of your veins. Medicine will be able to flow directly into your body through this catheter. You may be given medicine through this tube to help prevent pain and help you relax.  The medical or dental procedure will be done. AFTER THE PROCEDURE  You will stay in a recovery area until the medicine has worn off. Your blood pressure and pulse will be checked.   Depending on the procedure you had, you may be allowed to go home when you can tolerate liquids and your pain is under control. Document Released: 08/10/2001 Document Revised: 11/20/2013 Document Reviewed: 07/23/2013 [facility] Patient Information 2015 [place], [place]. This information is not intended to replace advice given to you by your health care provider. Make sure you discuss any questions you have with your health care provider. Conscious Sedation, Adult, Care After Refer to this sheet in the next few weeks. These instructions provide you with information on caring for yourself after your procedure. Your health care provider may also give you more specific instructions. Your treatment has been planned according to current medical practices, but problems sometimes occur. Call your health care provider if you have any problems or questions after your procedure. WHAT TO EXPECT AFTER THE PROCEDURE  After your procedure:  You may feel sleepy, clumsy, and have poor balance for several hours.  Vomiting may  occur if you eat too soon after the procedure. HOME CARE INSTRUCTIONS  Do not participate in any activities where you could become injured for at least 24 hours. Do not:  Drive.  Swim.  Ride a bicycle.  Operate heavy machinery.  Cook.  Use power tools.  Climb ladders.  Work from a high place.  Do not make important decisions or sign legal documents until you are improved.  If you  vomit, drink water, juice, or soup when you can drink without vomiting. Make sure you have little or no nausea before eating solid foods.  Only take over-the-counter or prescription medicines for pain, discomfort, or fever as directed by your health care provider.  Make sure you and your family fully understand everything about the medicines given to you, including what side effects may occur.  You should not drink alcohol, take sleeping pills, or take medicines that cause drowsiness for at least 24 hours.  If you smoke, do not smoke without supervision.  If you are feeling better, you may resume normal activities 24 hours after you were sedated.  Keep all appointments with your health care provider. SEEK MEDICAL CARE IF:  Your skin is pale or bluish in color.  You continue to feel nauseous or vomit.  Your pain is getting worse and is not helped by medicine.  You have bleeding or swelling.  You are still sleepy or feeling clumsy after 24 hours. SEEK IMMEDIATE MEDICAL CARE IF:  You develop a rash.  You have difficulty breathing.  You develop any type of allergic problem.  You have a fever. MAKE SURE YOU:  Understand these instructions.  Will watch your condition.  Will get help right away if you are not doing well or get worse. Document Released: 09/05/2013 Document Reviewed: 09/05/2013 [facility] Patient Information 2015 [place], [place]. This information is not intended to replace advice given to you by your health care provider. Make sure you discuss any questions you have with  your health care provider.

## 2015-04-21 NOTE — Telephone Encounter (Signed)
Gave avs & calendar for June. Sent message to schedule treatment. °

## 2015-04-22 ENCOUNTER — Telehealth: Payer: Self-pay

## 2015-04-22 ENCOUNTER — Telehealth: Payer: Self-pay | Admitting: Oncology

## 2015-04-22 ENCOUNTER — Other Ambulatory Visit: Payer: Self-pay | Admitting: Oncology

## 2015-04-22 DIAGNOSIS — C569 Malignant neoplasm of unspecified ovary: Secondary | ICD-10-CM

## 2015-04-22 LAB — CA 125: CA 125: 39.2 U/mL — AB (ref 0.0–38.1)

## 2015-04-22 MED ORDER — LIDOCAINE-PRILOCAINE 2.5-2.5 % EX CREA: TOPICAL_CREAM | CUTANEOUS | Status: DC

## 2015-04-22 MED ORDER — LORAZEPAM 1 MG PO TABS: ORAL_TABLET | ORAL | Status: DC

## 2015-04-22 MED ORDER — ONDANSETRON HCL 8 MG PO TABS: 8.0000 mg | ORAL_TABLET | Freq: Three times a day (TID) | ORAL | Status: DC | PRN

## 2015-04-22 MED ORDER — DEXAMETHASONE 4 MG PO TABS: ORAL_TABLET | ORAL | Status: DC

## 2015-04-22 NOTE — Telephone Encounter (Signed)
Karel Jarvis O >','<< Less Detail',event)" href="javascript:;">More Detail >>   Gordy Levan, MD   Sent: Tue Apr 22, 2015 4:42 PM    To: Baruch Merl, RN        Message     She is on the lopressor by cardiologist Dr Ellyn Hack for A fib. BP is good now and hopefully she will stay well hydrated. She should call Dr Ellyn Hack to see if he recommends any changes in dose around chemo. I will send my note to Dr Ellyn Hack.        thanks    ----- Message -----     From: Baruch Merl, RN     Sent: 04/22/2015  4:38 PM      To: Lennis Marion Downer, MD        Lennis,    Last treatment with Carboplatin/Taxol, Ms. Calico had to hold her BP meds as her BP dropped quite a bit.    She is currently on lopressor 25 mg 1 1/2 tabs bid. Does she need not take BP med the day of treatment?    Thanks Barbaraann Share

## 2015-04-22 NOTE — Telephone Encounter (Signed)
Patient Demographics     Patient Name Sex DOB SSN Address Phone    [name], [name] Female 07-24-1941 [id number] [place] [address] [phone] (Home) *Preferred*      Message  Received: Today    Lennis Marion Downer, MD  Baruch Merl, RN; Christa See, RN           Previous message sent re timing of decadron  Also:  I did not talk with patient/ husband about [place] skin testing, which she will need prior to each carboplatin treatment beginning with treatment next week. Please tell her that carboplatin is unusual in that it is more likely to have allergy reaction after >=7 treatments, vs most medications where allergy happens with first dose. The skin test can help [place] know if allergy reaction to full dose is likely, tho the skin test is not 100% accurate in predicting reactions so we always watch closely during treatments.  thanks

## 2015-04-22 NOTE — Telephone Encounter (Signed)
-----   Message from Gordy Levan, MD sent at 04/22/2015  8:35 AM EDT ----- Please send to pharmacy  Generics always fine  1.zofran 8 mg:  1 q 8 hr prn nausea. Will not make drowsy  #30  1RF  2.ativan 1 mg:  1/2 -  1 tablet (0.5 - 1 mg) SL or po q 6 hr prn nausea. Will make drowsy  #20 NRF  3.decadron 4 mg:  Five tabs with food (=20mg ) 12 hrs prior to chemo and 5 tabs with    food (=20 mg) 6 hrs prior to chemo  4.EMLA one large tube as directed 2 RF  First chemo 6-2.  RN please call patient prior to that treatment to be sure she understands timing of decadron.   thanks

## 2015-04-22 NOTE — Telephone Encounter (Signed)
Schedule treatment & IVF per care plan/POF.

## 2015-04-22 NOTE — Telephone Encounter (Signed)
Told Ms. Hehn to call Dr. Ellyn Hack regarding the lopressor dosing as noted below by Dr. Marko Plume.  Ms. Cleopatra Cedar verbalized understanding.

## 2015-04-22 NOTE — Telephone Encounter (Signed)
lvm for pt regarding to June appt... °

## 2015-04-22 NOTE — Telephone Encounter (Signed)
Reviewed information as noted below by Dr. Marko Plume regarding the Carbo Skin test. Told her antiemetics and Emla cream prescriptions sent to her pharmacy [facility] [address]. Reviewed taking Decadron premed at 11 pm on 04-30-15 with food and 0500 on 05-01-15. [name] she should take her lopressor prior to her treatments as her BP dropped last time.and the BP meds were held.  She is only on the lopressor now.  Told her that this question will be sent to Dr. Marko Plume to review.

## 2015-04-24 ENCOUNTER — Other Ambulatory Visit: Payer: Self-pay | Admitting: Oncology

## 2015-04-24 ENCOUNTER — Telehealth: Payer: Self-pay | Admitting: Oncology

## 2015-04-24 DIAGNOSIS — C569 Malignant neoplasm of unspecified ovary: Secondary | ICD-10-CM

## 2015-04-24 NOTE — Telephone Encounter (Signed)
Returned Advertising account executive. Left message to confirm appointment moved to 10:45 on 06/07.

## 2015-04-24 NOTE — Telephone Encounter (Signed)
Returned Advertising account executive. Left message to confirm no later time for 06/02 with lab & infusion.

## 2015-04-25 ENCOUNTER — Encounter: Payer: Medicare Other | Admitting: Physical Therapy

## 2015-04-29 ENCOUNTER — Encounter: Payer: Medicare Other | Admitting: Physical Therapy

## 2015-05-01 ENCOUNTER — Ambulatory Visit (HOSPITAL_BASED_OUTPATIENT_CLINIC_OR_DEPARTMENT_OTHER): Payer: Medicare Other

## 2015-05-01 ENCOUNTER — Other Ambulatory Visit (HOSPITAL_BASED_OUTPATIENT_CLINIC_OR_DEPARTMENT_OTHER): Payer: Medicare Other

## 2015-05-01 VITALS — BP 162/70 | HR 64 | Temp 97.4°F | Resp 18

## 2015-05-01 DIAGNOSIS — C561 Malignant neoplasm of right ovary: Secondary | ICD-10-CM

## 2015-05-01 DIAGNOSIS — C569 Malignant neoplasm of unspecified ovary: Secondary | ICD-10-CM

## 2015-05-01 DIAGNOSIS — Z5111 Encounter for antineoplastic chemotherapy: Secondary | ICD-10-CM

## 2015-05-01 DIAGNOSIS — C562 Malignant neoplasm of left ovary: Secondary | ICD-10-CM

## 2015-05-01 LAB — CBC WITH DIFFERENTIAL/PLATELET
BASO%: 0.2 % (ref 0.0–2.0)
Basophils Absolute: 0 10*3/uL (ref 0.0–0.1)
EOS ABS: 0 10*3/uL (ref 0.0–0.5)
EOS%: 0 % (ref 0.0–7.0)
HCT: 36.1 % (ref 34.8–46.6)
HGB: 12.4 g/dL (ref 11.6–15.9)
LYMPH#: 0.6 10*3/uL — AB (ref 0.9–3.3)
LYMPH%: 9.8 % — ABNORMAL LOW (ref 14.0–49.7)
MCH: 33.3 pg (ref 25.1–34.0)
MCHC: 34.3 g/dL (ref 31.5–36.0)
MCV: 97 fL (ref 79.5–101.0)
MONO#: 0.1 10*3/uL (ref 0.1–0.9)
MONO%: 1.2 % (ref 0.0–14.0)
NEUT%: 88.8 % — AB (ref 38.4–76.8)
NEUTROS ABS: 5 10*3/uL (ref 1.5–6.5)
Platelets: 162 10*3/uL (ref 145–400)
RBC: 3.72 10*6/uL (ref 3.70–5.45)
RDW: 12.9 % (ref 11.2–14.5)
WBC: 5.6 10*3/uL (ref 3.9–10.3)
nRBC: 0 % (ref 0–0)

## 2015-05-01 LAB — COMPREHENSIVE METABOLIC PANEL (CC13)
ALBUMIN: 3.7 g/dL (ref 3.5–5.0)
ALT: 12 U/L (ref 0–55)
AST: 15 U/L (ref 5–34)
Alkaline Phosphatase: 84 U/L (ref 40–150)
Anion Gap: 10 mEq/L (ref 3–11)
BUN: 12.2 mg/dL (ref 7.0–26.0)
CHLORIDE: 102 meq/L (ref 98–109)
CO2: 26 mEq/L (ref 22–29)
Calcium: 9.1 mg/dL (ref 8.4–10.4)
Creatinine: 0.7 mg/dL (ref 0.6–1.1)
EGFR: 87 mL/min/{1.73_m2} — AB (ref >90)
Glucose: 183 mg/dl — ABNORMAL HIGH (ref 70–140)
Potassium: 3.7 mEq/L (ref 3.5–5.1)
Sodium: 138 mEq/L (ref 136–145)
Total Bilirubin: 0.52 mg/dL (ref 0.20–1.20)
Total Protein: 6.2 g/dL — ABNORMAL LOW (ref 6.4–8.3)

## 2015-05-01 MED ORDER — DIPHENHYDRAMINE HCL 50 MG/ML IJ SOLN
INTRAMUSCULAR | Status: AC
  Filled 2015-05-01: qty 1

## 2015-05-01 MED ORDER — CARBOPLATIN CHEMO INTRADERMAL TEST DOSE 100MCG/0.02ML
100.0000 ug | Freq: Once | INTRADERMAL | Status: AC
  Administered 2015-05-01: 100 ug via INTRADERMAL
  Filled 2015-05-01: qty 0.01

## 2015-05-01 MED ORDER — FAMOTIDINE IN NACL 20-0.9 MG/50ML-% IV SOLN
20.0000 mg | Freq: Once | INTRAVENOUS | Status: AC
  Administered 2015-05-01: 20 mg via INTRAVENOUS

## 2015-05-01 MED ORDER — DIPHENHYDRAMINE HCL 50 MG/ML IJ SOLN
25.0000 mg | Freq: Once | INTRAMUSCULAR | Status: AC
  Administered 2015-05-01: 25 mg via INTRAVENOUS

## 2015-05-01 MED ORDER — SODIUM CHLORIDE 0.9 % IV SOLN
Freq: Once | INTRAVENOUS | Status: AC
  Administered 2015-05-01: 13:00:00 via INTRAVENOUS
  Filled 2015-05-01: qty 8

## 2015-05-01 MED ORDER — SODIUM CHLORIDE 0.9 % IV SOLN
Freq: Once | INTRAVENOUS | Status: AC
  Administered 2015-05-01: 12:00:00 via INTRAVENOUS

## 2015-05-01 MED ORDER — DEXTROSE 5 % IV SOLN
135.0000 mg/m2 | Freq: Once | INTRAVENOUS | Status: AC
  Administered 2015-05-01: 222 mg via INTRAVENOUS
  Filled 2015-05-01: qty 37

## 2015-05-01 MED ORDER — FAMOTIDINE IN NACL 20-0.9 MG/50ML-% IV SOLN
INTRAVENOUS | Status: AC
  Filled 2015-05-01: qty 50

## 2015-05-01 MED ORDER — SODIUM CHLORIDE 0.9 % IJ SOLN
10.0000 mL | INTRAMUSCULAR | Status: DC | PRN
  Administered 2015-05-01: 10 mL
  Filled 2015-05-01: qty 10

## 2015-05-01 MED ORDER — HEPARIN SOD (PORK) LOCK FLUSH 100 UNIT/ML IV SOLN
500.0000 [IU] | Freq: Once | INTRAVENOUS | Status: AC | PRN
  Administered 2015-05-01: 500 [IU]
  Filled 2015-05-01: qty 5

## 2015-05-01 MED ORDER — SODIUM CHLORIDE 0.9 % IV SOLN
369.5000 mg | Freq: Once | INTRAVENOUS | Status: AC
  Administered 2015-05-01: 370 mg via INTRAVENOUS
  Filled 2015-05-01: qty 37

## 2015-05-01 NOTE — Patient Instructions (Signed)
[facility] Discharge Instructions for Patients Receiving Chemotherapy  Today you received the following chemotherapy agents  Taxol and Carboplatin  To help prevent nausea and vomiting after your treatment, we encourage you to take your nausea medication  Ativan and Zofran as directed   If you develop nausea and vomiting that is not controlled by your nausea medication, call the clinic.   BELOW ARE SYMPTOMS THAT SHOULD BE REPORTED IMMEDIATELY:  *FEVER GREATER THAN 100.5 F  *CHILLS WITH OR WITHOUT FEVER  NAUSEA AND VOMITING THAT IS NOT CONTROLLED WITH YOUR NAUSEA MEDICATION  *UNUSUAL SHORTNESS OF BREATH  *UNUSUAL BRUISING OR BLEEDING  TENDERNESS IN MOUTH AND THROAT WITH OR WITHOUT PRESENCE OF ULCERS  *URINARY PROBLEMS  *BOWEL PROBLEMS  UNUSUAL RASH Items with * indicate a potential emergency and should be followed up as soon as possible.  Feel free to call the clinic you have any questions or concerns. The clinic phone number is [phone].  Please show the [place] at check-in to the Emergency Department and triage nurse.

## 2015-05-02 ENCOUNTER — Other Ambulatory Visit: Payer: Self-pay | Admitting: Internal Medicine

## 2015-05-02 ENCOUNTER — Ambulatory Visit (HOSPITAL_BASED_OUTPATIENT_CLINIC_OR_DEPARTMENT_OTHER): Payer: Medicare Other

## 2015-05-02 ENCOUNTER — Other Ambulatory Visit: Payer: Self-pay | Admitting: Oncology

## 2015-05-02 ENCOUNTER — Telehealth: Payer: Self-pay

## 2015-05-02 ENCOUNTER — Encounter: Payer: Medicare Other | Admitting: Physical Therapy

## 2015-05-02 VITALS — BP 147/67 | HR 67 | Temp 97.0°F | Resp 16

## 2015-05-02 DIAGNOSIS — C562 Malignant neoplasm of left ovary: Secondary | ICD-10-CM

## 2015-05-02 DIAGNOSIS — R5383 Other fatigue: Secondary | ICD-10-CM | POA: Diagnosis not present

## 2015-05-02 DIAGNOSIS — C569 Malignant neoplasm of unspecified ovary: Secondary | ICD-10-CM

## 2015-05-02 DIAGNOSIS — C561 Malignant neoplasm of right ovary: Secondary | ICD-10-CM

## 2015-05-02 DIAGNOSIS — C73 Malignant neoplasm of thyroid gland: Secondary | ICD-10-CM

## 2015-05-02 DIAGNOSIS — E041 Nontoxic single thyroid nodule: Secondary | ICD-10-CM

## 2015-05-02 LAB — CA 125: CA 125: 33 U/mL (ref <35)

## 2015-05-02 MED ORDER — SODIUM CHLORIDE 0.9 % IV SOLN
1000.0000 mL | Freq: Once | INTRAVENOUS | Status: AC
  Administered 2015-05-02: 1000 mL via INTRAVENOUS

## 2015-05-02 NOTE — Telephone Encounter (Signed)
[name] stated that she is doing fine.  She is tired but drinking fluids.  No nausea or vomiting. Bowels moving fine so far. She did receive IVF here today as she does have some difficulty pushing fluids.  Suggested jello, ice cream,popcicles, and watermelon to change of fluid intake. [name] verbalized understanding. Pt. Knows to call [phone] if any problems arise.

## 2015-05-02 NOTE — Telephone Encounter (Signed)
-----   Message from Cora Collum, RN sent at 05/01/2015 11:26 AM EDT ----- Regarding: Chemo follow up call. Dr. Edwyna Shell Ist time Taxol and Carboplatin

## 2015-05-02 NOTE — Patient Instructions (Signed)
Dehydration, Adult Dehydration is when you lose more fluids from the body than you take in. Vital organs like the kidneys, brain, and heart cannot function without a proper amount of fluids and salt. Any loss of fluids from the body can cause dehydration.  CAUSES   Vomiting.  Diarrhea.  Excessive sweating.  Excessive urine output.  Fever. SYMPTOMS  Mild dehydration  Thirst.  Dry lips.  Slightly dry mouth. Moderate dehydration  Very dry mouth.  Sunken eyes.  Skin does not bounce back quickly when lightly pinched and released.  Dark urine and decreased urine production.  Decreased tear production.  Headache. Severe dehydration  Very dry mouth.  Extreme thirst.  Rapid, weak pulse (more than 100 beats per minute at rest).  Cold hands and feet.  Not able to sweat in spite of heat and temperature.  Rapid breathing.  Blue lips.  Confusion and lethargy.  Difficulty being awakened.  Minimal urine production.  No tears. DIAGNOSIS  Your caregiver will diagnose dehydration based on your symptoms and your exam. Blood and urine tests will help confirm the diagnosis. The diagnostic evaluation should also identify the cause of dehydration. TREATMENT  Treatment of mild or moderate dehydration can often be done at home by increasing the amount of fluids that you drink. It is best to drink small amounts of fluid more often. Drinking too much at one time can make vomiting worse. Refer to the home care instructions below. Severe dehydration needs to be treated at the hospital where you will probably be given intravenous (IV) fluids that contain water and electrolytes. HOME CARE INSTRUCTIONS   Ask your caregiver about specific rehydration instructions.  Drink enough fluids to keep your urine clear or pale yellow.  Drink small amounts frequently if you have nausea and vomiting.  Eat as you normally do.  Avoid:  Foods or drinks high in sugar.  Carbonated  drinks.  Juice.  Extremely hot or cold fluids.  Drinks with caffeine.  Fatty, greasy foods.  Alcohol.  Tobacco.  Overeating.  Gelatin desserts.  Wash your hands well to avoid spreading bacteria and viruses.  Only take over-the-counter or prescription medicines for pain, discomfort, or fever as directed by your caregiver.  Ask your caregiver if you should continue all prescribed and over-the-counter medicines.  Keep all follow-up appointments with your caregiver. SEEK MEDICAL CARE IF:  You have abdominal pain and it increases or stays in one area (localizes).  You have a rash, stiff neck, or severe headache.  You are irritable, sleepy, or difficult to awaken.  You are weak, dizzy, or extremely thirsty. SEEK IMMEDIATE MEDICAL CARE IF:   You are unable to keep fluids down or you get worse despite treatment.  You have frequent episodes of vomiting or diarrhea.  You have blood or green matter (bile) in your vomit.  You have blood in your stool or your stool looks black and tarry.  You have not urinated in 6 to 8 hours, or you have only urinated a small amount of very dark urine.  You have a fever.  You faint. MAKE SURE YOU:   Understand these instructions.  Will watch your condition.  Will get help right away if you are not doing well or get worse. Document Released: 11/15/2005 Document Revised: 02/07/2012 Document Reviewed: 07/05/2011 [employer] Patient Information 2015 [employer]. This information is not intended to replace advice given to you by your health care provider. Make sure you discuss any questions you have with your health care   provider.  

## 2015-05-04 ENCOUNTER — Other Ambulatory Visit: Payer: Self-pay | Admitting: Oncology

## 2015-05-05 ENCOUNTER — Ambulatory Visit (HOSPITAL_BASED_OUTPATIENT_CLINIC_OR_DEPARTMENT_OTHER): Payer: Medicare Other

## 2015-05-05 ENCOUNTER — Ambulatory Visit (HOSPITAL_BASED_OUTPATIENT_CLINIC_OR_DEPARTMENT_OTHER): Payer: Medicare Other | Admitting: Oncology

## 2015-05-05 ENCOUNTER — Other Ambulatory Visit (HOSPITAL_BASED_OUTPATIENT_CLINIC_OR_DEPARTMENT_OTHER): Payer: Medicare Other

## 2015-05-05 ENCOUNTER — Encounter: Payer: Self-pay | Admitting: Oncology

## 2015-05-05 ENCOUNTER — Telehealth: Payer: Self-pay | Admitting: Oncology

## 2015-05-05 VITALS — BP 140/72 | HR 70 | Temp 98.2°F | Resp 18 | Ht 63.0 in | Wt 132.9 lb

## 2015-05-05 DIAGNOSIS — Z79899 Other long term (current) drug therapy: Secondary | ICD-10-CM

## 2015-05-05 DIAGNOSIS — R971 Elevated cancer antigen 125 [CA 125]: Secondary | ICD-10-CM

## 2015-05-05 DIAGNOSIS — C562 Malignant neoplasm of left ovary: Secondary | ICD-10-CM | POA: Diagnosis not present

## 2015-05-05 DIAGNOSIS — C569 Malignant neoplasm of unspecified ovary: Secondary | ICD-10-CM

## 2015-05-05 DIAGNOSIS — Z95828 Presence of other vascular implants and grafts: Secondary | ICD-10-CM

## 2015-05-05 DIAGNOSIS — C561 Malignant neoplasm of right ovary: Secondary | ICD-10-CM

## 2015-05-05 DIAGNOSIS — Z5189 Encounter for other specified aftercare: Secondary | ICD-10-CM

## 2015-05-05 DIAGNOSIS — E039 Hypothyroidism, unspecified: Secondary | ICD-10-CM

## 2015-05-05 DIAGNOSIS — I480 Paroxysmal atrial fibrillation: Secondary | ICD-10-CM

## 2015-05-05 DIAGNOSIS — I10 Essential (primary) hypertension: Secondary | ICD-10-CM

## 2015-05-05 DIAGNOSIS — K5904 Chronic idiopathic constipation: Secondary | ICD-10-CM

## 2015-05-05 LAB — CBC WITH DIFFERENTIAL/PLATELET
BASO%: 0.3 % (ref 0.0–2.0)
BASOS ABS: 0 10*3/uL (ref 0.0–0.1)
EOS%: 1.1 % (ref 0.0–7.0)
Eosinophils Absolute: 0 10*3/uL (ref 0.0–0.5)
HCT: 35.2 % (ref 34.8–46.6)
HEMOGLOBIN: 12.1 g/dL (ref 11.6–15.9)
LYMPH%: 31.1 % (ref 14.0–49.7)
MCH: 33.9 pg (ref 25.1–34.0)
MCHC: 34.4 g/dL (ref 31.5–36.0)
MCV: 98.6 fL (ref 79.5–101.0)
MONO#: 0 10*3/uL — AB (ref 0.1–0.9)
MONO%: 0.3 % (ref 0.0–14.0)
NEUT#: 2.4 10*3/uL (ref 1.5–6.5)
NEUT%: 67.2 % (ref 38.4–76.8)
PLATELETS: 105 10*3/uL — AB (ref 145–400)
RBC: 3.57 10*6/uL — ABNORMAL LOW (ref 3.70–5.45)
RDW: 12.5 % (ref 11.2–14.5)
WBC: 3.5 10*3/uL — AB (ref 3.9–10.3)
lymph#: 1.1 10*3/uL (ref 0.9–3.3)

## 2015-05-05 LAB — COMPREHENSIVE METABOLIC PANEL (CC13)
ALBUMIN: 3.4 g/dL — AB (ref 3.5–5.0)
ALT: 14 U/L (ref 0–55)
AST: 21 U/L (ref 5–34)
Alkaline Phosphatase: 80 U/L (ref 40–150)
Anion Gap: 7 mEq/L (ref 3–11)
BILIRUBIN TOTAL: 0.99 mg/dL (ref 0.20–1.20)
BUN: 11.2 mg/dL (ref 7.0–26.0)
CO2: 30 meq/L — AB (ref 22–29)
CREATININE: 0.6 mg/dL (ref 0.6–1.1)
Calcium: 8.6 mg/dL (ref 8.4–10.4)
Chloride: 100 mEq/L (ref 98–109)
EGFR: 89 mL/min/{1.73_m2} — ABNORMAL LOW (ref >90)
Glucose: 105 mg/dl (ref 70–140)
Potassium: 4.3 mEq/L (ref 3.5–5.1)
SODIUM: 137 meq/L (ref 136–145)
Total Protein: 6 g/dL — ABNORMAL LOW (ref 6.4–8.3)

## 2015-05-05 MED ORDER — TBO-FILGRASTIM 300 MCG/0.5ML ~~LOC~~ SOSY
300.0000 ug | PREFILLED_SYRINGE | Freq: Once | SUBCUTANEOUS | Status: AC
  Administered 2015-05-05: 300 ug via SUBCUTANEOUS
  Filled 2015-05-05: qty 0.5

## 2015-05-05 NOTE — Progress Notes (Signed)
OFFICE PROGRESS NOTE   May 05, 2015   Physicians: D. ClarkePearson; J.Russo, T.Fontaine, Glenetta Hew (cardiology), [name], D.Jacobs, Zollie Beckers  INTERVAL HISTORY:   Patient is seen, together with husband, in continuing attention to recurrent ovarian cancer, for which she resumed chemotherapy with carboplatin taxol on 05-01-15. Due to previous carboplatin, she now has skin testing prior to each treatment. She has had no unexpected problems with most recent treatment thus far. She will begin granix today, planned at least for 3 days. Plan to add avastin with cycle 2, held cycle 1 due to [facility] placement.  PAC functioned without difficulty for chemo, tho she had bruising with placement (on plavix and ASA for A fib). GI system was "just not correct", no frank nausea but symptoms better with zofran. She was more constipated, resolved with addition of dulcolax tablet to usual miralax, colace and benefiber. Left knee was more achy, which may have been taxol (that surgery in Feb). Slight vaginal/ perineal irritation without dysuria or itching. Slight scratchy throat x 2 days, not obviously different from usual year round environmental allergies.    PAC placed by IR 04-21-15 No genetics counseling located in this EMR. Message to genetics counselors to confirm.  CA 125 on 05-01-15   35 She plans beach vacation with family June 18 - 25  ONCOLOGIC HISTORY Patient presented to Dr Abbie Sons in late Jan 2014 after bright red spotting that AM following 2 weeks of spotting in early Dec 2015. with uterus larger than had been apparent on exam in Oct 2013. Sonohystogram 01-05-13 showed uterus normal size and echotexture, endometrium 4.3 mm, left ovary normal and right adnexa with 1.1 x 8.4.x8.2 cm cystic and solid mass. Endometrial biopsy benign and CA 125 also on 01-05-13 was 178.8. She had CT AP 01-17-13 with 1.0 x 6.9 x 8.9 cm complex right ovarian mass, no ascites, small retroperitoneal nodes. She was seen by Dr  Skeet Latch on 01-18-13 and taken to surgery by Dr Josephina Shih on 02-13-13, which was TAH/BSO/ omentectomy/ureterolysis/ resection of cul-de-sac tumor/right pelvic lymphadnectomy and resection of rectum with reanastomosis. At completion of surgery there was no gross residual disease. 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 Washings [phone]) had rare clusters of atypical cells. Chemotherapy with dose dense taxol carboplatin was begun day 1 cycle 1 on 03-20-13; ANC was 1.1 on day 15 cycle 1 with taxol given and neupogen added 04-04-13. She had day 1 cycle 2 treatment on 04-17-13, then was briefly hospitalized 5-22 to 04-20-13 after syncopal episode, with antihypertensive agents DCd and UTI treated. She was feeling much better at time of "day 8" cycle 2 on 05-01-13 and did have neupogen 300 mcg x 1 dose on 05-02-13. She was readmitted to hospital 6-5 thru 05-05-13 with fever, empirically on antibiotics and blood cultures negative. She then went on planned beach vacation such that day 1 cycle 3 was administered on 05-22-13, with neupogen. Day 8 cycle 4 was delayed due to low ANC. Cycle 6 completed 08-14-13. CT AP 09-25-13 had no evidence of cancer and CA 125 was 18.3 on 07-24-2013. Marker was 20 in early Dec 2015, 26 on 01-31-15 and 35 on 03-14-15. PET 04-03-15 documented disease bilateral pelvic sidewalls, vaginal cuff and scattered areas thru abdomen/ pelvis, as well as area of uptake in spleen.  She resumed carboplatin taxol using q 3 week regimen on 05-01-15.     Review of systems as above, also: Some fatigue. No fever. No SOB or lower respiratory symptoms. No peripheral neuropathy Remainder of 10 point Review of Systems negative.  Objective:  Vital signs in last 24  hours:  BP 140/72 mmHg  Pulse 70  Temp(Src) 98.2 F (36.8 C) (Oral)  Resp 18  Ht 5' 3" (1.6 m)  Wt 132 lb 14.4 oz (60.283 kg)  BMI 23.55 kg/m2  SpO2 99% Weight stable Alert, oriented and appropriate. Ambulatory without difficulty.  No alopecia  HEENT:PERRL, sclerae not icteric. Oral mucosa moist without lesions, posterior pharynx clear.  Neck supple. No JVD.  Lymphatics:no cervical,supraclavicular, or inguinal adenopathy Resp: clear to auscultation bilaterally and normal percussion bilaterally Cardio: regular rate and rhythm. No gallop. GI: soft, nontender, not distended, no mass or organomegaly. Some bowel sounds. Surgical incision not remarkable. Musculoskeletal/ Extremities: without pitting edema, cords, tenderness. No effusion or tenderness left knee Neuro: no peripheral neuropathy. Otherwise nonfocal. Psych appropriate mood and affect Skin without rash, ecchymosis, petechiae. Surgical scar left knee well healed. Portacath-without erythema or tenderness. Slight bruising inferiorly, resolving. Tubing apparent at clavicle, unremarkable.  Lab Results:  Results for orders placed or performed in visit on 05/05/15  CBC with Differential  Result Value Ref Range   WBC 3.5 (L) 3.9 - 10.3 10e3/uL   NEUT# 2.4 1.5 - 6.5 10e3/uL   HGB 12.1 11.6 - 15.9 g/dL   HCT 35.2 34.8 - 46.6 %   Platelets 105 (L) 145 - 400 10e3/uL   MCV 98.6 79.5 - 101.0 fL   MCH 33.9 25.1 - 34.0 pg   MCHC 34.4 31.5 - 36.0 g/dL   RBC 3.57 (L) 3.70 - 5.45 10e6/uL   RDW 12.5 11.2 - 14.5 %   lymph# 1.1 0.9 - 3.3 10e3/uL   MONO# 0.0 (L) 0.1 - 0.9 10e3/uL   Eosinophils Absolute 0.0 0.0 - 0.5 10e3/uL   Basophils Absolute 0.0 0.0 - 0.1 10e3/uL   NEUT% 67.2 38.4 - 76.8 %   LYMPH% 31.1 14.0 - 49.7 %   MONO% 0.3 0.0 - 14.0 %   EOS% 1.1 0.0 - 7.0 %   BASO% 0.3 0.0 - 2.0 %  Comprehensive metabolic panel (Cmet) - [facility]  Result Value Ref Range   Sodium 137 136 - 145 mEq/L   Potassium 4.3 3.5 - 5.1 mEq/L   Chloride  100 98 - 109 mEq/L   CO2 30 (H) 22 - 29 mEq/L   Glucose 105 70 - 140 mg/dl   BUN 11.2 7.0 - 26.0 mg/dL   Creatinine 0.6 0.6 - 1.1 mg/dL   Total Bilirubin 0.99 0.20 - 1.20 mg/dL   Alkaline Phosphatase 80 40 - 150 U/L   AST 21 5 - 34 U/L   ALT 14 0 - 55 U/L   Total Protein 6.0 (L) 6.4 - 8.3 g/dL   Albumin 3.4 (L) 3.5 - 5.0 g/dL   Calcium 8.6 8.4 - 10.4 mg/dL   Anion Gap 7 3 - 11 mEq/L   EGFR 89 (L) >90 ml/min/1.73 m2    Counts discussed with patient during visit.   Studies/Results:  No results found.  Medications: I have reviewed the patient's current medications. Continue bowel medications. Discussed antiemetics if helpful for GI symptoms.  Note still on estrace, as she has used for years. Patient has not been willing to consider DC.   DISCUSSION  Counts are dropping fairly quickly from first  cycle of this dosing. Will give granix daily x 3 beginning today as planned, then will repeat CBC on 05-09-15, with additional granix if appropriate post MD review of CBC that day.  We discussed option of neulasta instead of daily granix for subsequent chemo, however she has not made decision yet and no preauthorization initiated for neulasta. Mentioned On Pro injector for neulasta also.  Discussed use of avastin with recurrent ovarian cancer, not with adjuvant therapy. 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 Resume chemotherapy with carboplatin and taxol now on q 3 week dosing, first 05-01-15; note patient perfers Mondays and beach vacation June 18-25. Possible addition of avastin starting cycle 2. Granix 6-6, 6-7, 6-8 and repeat CBC 6-10 with  possible further granix. I will see her next week with counts again prior to their beach trip.  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 Note on plavix and ASA if platelets drop with chemo. 5.previous constipation requiring increased laxatives with adjuvant chemo, up to date on colonoscopy 6.hypothyroid on replacement 7.right hip replacement 2013 also for osteoarthritis 8. Long estrogen replacement, which she still uses. She has previously refused to discontinue the estrogen. Initial surgical path from 02-13-13 does not report ER PR testing. 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 Breast tissue heterogeneously dense by last tomo mammograms at [facility] 03-21-15, those mammograms otherwise negative.  10. Message sent to genetics counselors ? If testing done, as I do not locate this in EMR following visit.  Granix orders placed. Additional granix on 6-10 will depend on ANC and on whether or not counts are at nadir: RN to discuss CBC with MD on 6-10 for orders. All questions answered. Patient knows to call if concerns prior to next scheduled appointments. Time spent 25 min including >50% counseling and coordination of care.  Cc Gilles Chiquito  Gordy Levan, MD   05/05/2015, 3:46 PM

## 2015-05-05 NOTE — Telephone Encounter (Signed)
Appointments made and a note was place for patient to get a new schedule 6/7

## 2015-05-05 NOTE — Telephone Encounter (Signed)
Appointments made and avs printed for patient °

## 2015-05-06 ENCOUNTER — Telehealth: Payer: Self-pay | Admitting: Nurse Practitioner

## 2015-05-06 ENCOUNTER — Ambulatory Visit (HOSPITAL_BASED_OUTPATIENT_CLINIC_OR_DEPARTMENT_OTHER): Payer: Medicare Other

## 2015-05-06 ENCOUNTER — Telehealth: Payer: Self-pay | Admitting: *Deleted

## 2015-05-06 ENCOUNTER — Ambulatory Visit (HOSPITAL_BASED_OUTPATIENT_CLINIC_OR_DEPARTMENT_OTHER): Payer: Medicare Other | Admitting: Nurse Practitioner

## 2015-05-06 ENCOUNTER — Encounter: Payer: Self-pay | Admitting: Nurse Practitioner

## 2015-05-06 ENCOUNTER — Other Ambulatory Visit: Payer: Self-pay

## 2015-05-06 VITALS — BP 115/56 | HR 66 | Temp 98.5°F | Resp 16 | Wt 130.9 lb

## 2015-05-06 VITALS — BP 136/61 | HR 75 | Temp 99.1°F

## 2015-05-06 DIAGNOSIS — C561 Malignant neoplasm of right ovary: Secondary | ICD-10-CM

## 2015-05-06 DIAGNOSIS — R509 Fever, unspecified: Secondary | ICD-10-CM | POA: Diagnosis not present

## 2015-05-06 DIAGNOSIS — Z95828 Presence of other vascular implants and grafts: Secondary | ICD-10-CM

## 2015-05-06 DIAGNOSIS — C562 Malignant neoplasm of left ovary: Secondary | ICD-10-CM

## 2015-05-06 DIAGNOSIS — C569 Malignant neoplasm of unspecified ovary: Secondary | ICD-10-CM

## 2015-05-06 DIAGNOSIS — Z5189 Encounter for other specified aftercare: Secondary | ICD-10-CM | POA: Diagnosis not present

## 2015-05-06 DIAGNOSIS — R971 Elevated cancer antigen 125 [CA 125]: Secondary | ICD-10-CM

## 2015-05-06 LAB — CBC WITH DIFFERENTIAL/PLATELET
BASO%: 0 % (ref 0.0–2.0)
Basophils Absolute: 0 10*3/uL (ref 0.0–0.1)
EOS%: 0.4 % (ref 0.0–7.0)
Eosinophils Absolute: 0 10*3/uL (ref 0.0–0.5)
HCT: 31.8 % — ABNORMAL LOW (ref 34.8–46.6)
HGB: 10.9 g/dL — ABNORMAL LOW (ref 11.6–15.9)
LYMPH%: 21.3 % (ref 14.0–49.7)
MCH: 33.5 pg (ref 25.1–34.0)
MCHC: 34.3 g/dL (ref 31.5–36.0)
MCV: 97.8 fL (ref 79.5–101.0)
MONO#: 0 10*3/uL — ABNORMAL LOW (ref 0.1–0.9)
MONO%: 0.4 % (ref 0.0–14.0)
NEUT%: 77.9 % — ABNORMAL HIGH (ref 38.4–76.8)
NEUTROS ABS: 2 10*3/uL (ref 1.5–6.5)
Platelets: 76 10*3/uL — ABNORMAL LOW (ref 145–400)
RBC: 3.25 10*6/uL — ABNORMAL LOW (ref 3.70–5.45)
RDW: 12.5 % (ref 11.2–14.5)
WBC: 2.5 10*3/uL — AB (ref 3.9–10.3)
lymph#: 0.5 10*3/uL — ABNORMAL LOW (ref 0.9–3.3)

## 2015-05-06 LAB — URINALYSIS, MICROSCOPIC - CHCC
Bilirubin (Urine): NEGATIVE
Blood: NEGATIVE
Glucose: NEGATIVE mg/dL
Ketones: NEGATIVE mg/dL
Nitrite: NEGATIVE
PH: 7.5 (ref 4.6–8.0)
Protein: 30 mg/dL
SPECIFIC GRAVITY, URINE: 1.01 (ref 1.003–1.035)
UROBILINOGEN UR: 0.2 mg/dL (ref 0.2–1)

## 2015-05-06 LAB — COMPREHENSIVE METABOLIC PANEL (CC13)
ALBUMIN: 3.5 g/dL (ref 3.5–5.0)
ALT: 17 U/L (ref 0–55)
AST: 21 U/L (ref 5–34)
Alkaline Phosphatase: 80 U/L (ref 40–150)
Anion Gap: 7 mEq/L (ref 3–11)
BILIRUBIN TOTAL: 1.51 mg/dL — AB (ref 0.20–1.20)
BUN: 11.1 mg/dL (ref 7.0–26.0)
CHLORIDE: 99 meq/L (ref 98–109)
CO2: 27 mEq/L (ref 22–29)
CREATININE: 0.6 mg/dL (ref 0.6–1.1)
Calcium: 8.6 mg/dL (ref 8.4–10.4)
EGFR: >90 mL/min/{1.73_m2} (ref >90)
Glucose: 98 mg/dl (ref 70–140)
Potassium: 4 mEq/L (ref 3.5–5.1)
SODIUM: 133 meq/L — AB (ref 136–145)
Total Protein: 5.7 g/dL — ABNORMAL LOW (ref 6.4–8.3)

## 2015-05-06 MED ORDER — TBO-FILGRASTIM 300 MCG/0.5ML ~~LOC~~ SOSY
300.0000 ug | PREFILLED_SYRINGE | Freq: Once | SUBCUTANEOUS | Status: AC
  Administered 2015-05-06: 300 ug via SUBCUTANEOUS
  Filled 2015-05-06: qty 0.5

## 2015-05-06 MED ORDER — SODIUM CHLORIDE 0.9 % IJ SOLN
10.0000 mL | INTRAMUSCULAR | Status: DC | PRN
  Administered 2015-05-06: 10 mL via INTRAVENOUS
  Filled 2015-05-06: qty 10

## 2015-05-06 MED ORDER — HEPARIN SOD (PORK) LOCK FLUSH 100 UNIT/ML IV SOLN
500.0000 [IU] | Freq: Once | INTRAVENOUS | Status: AC
  Administered 2015-05-06: 500 [IU] via INTRAVENOUS
  Filled 2015-05-06: qty 5

## 2015-05-06 MED ORDER — CIPROFLOXACIN HCL 500 MG PO TABS: 500.0000 mg | ORAL_TABLET | Freq: Two times a day (BID) | ORAL | Status: DC

## 2015-05-06 NOTE — Patient Instructions (Signed)

## 2015-05-06 NOTE — Telephone Encounter (Signed)
Verbal order received and read back from Tina Lesser NP for [name] to come in for [facility] x 2 ([name]), UA/CS, CBC-diff, CMET and visit.    Called patient who reports she did take tylenol.  Checked temperature and reports temp at this time = 101.7.  "It's coming down and I think I'm getting better.  Advised to go to ER if temperature does not come down or Still does not want to come in.  This nurse advised to go to ER due to elevated temperature.  [name] now says she will keep checking temperature and call if she is coming in.  Advised she will need to be here by 2:30 pm for labs and [facility] visit.  Awaiting return call from patient.  Instructed to page Triage.

## 2015-05-06 NOTE — Assessment & Plan Note (Signed)
Bilirubin has increased from 0.99 up to 1.51.  Patient denies any abdominal discomfort whatsoever.  She also denies any nausea or vomiting.  Will continue to monitor closely.

## 2015-05-06 NOTE — Telephone Encounter (Signed)
Labs/flush/ov with CB/NP per 06/07 POF..... KJ Pt is aware she was scheduled for injection already

## 2015-05-06 NOTE — Assessment & Plan Note (Addendum)
Patient received her first cycle of carbo plan/Taxol chemotherapy on 05/01/2015.  Labs obtained today reveal declining blood count with WBC 2.5, hemoglobin 10.9, and platelet count 76.  Patient did receive her second out of a series of 3 Granix injections today.  She has plans to return tomorrow for her third injection.  Decision was made to hold on initiation of Avastin until cycle 2 of her chemotherapy secondary to Port-A-Cath placement.  Patient is scheduled for labs only on 05/09/2015.  Patient is scheduled for labs and a follow-up visit on 05/12/2015.

## 2015-05-06 NOTE — Assessment & Plan Note (Signed)
Patient reports developing a fever to maximum of 102.2 earlier today.  She denies any URI symptoms, no diarrhea or constipation, no nausea or vomiting.  She states that she does have some mild stinging when she urinates.  On exam.-Patient appears nontoxic; and in no acute distress.  No abdominal discomfort or flank pain.  Labs obtained today revealed WBC 2.5.  Patient received her second in a series of 3 Granix injections today.  Urinalysis obtained today revealed trace leukocytes and just a few bacteria.  Pending both urine cultures and blood cultures.  Vital signs stable while at the [facility]; and patient was afebrile with to return 98.5.  Will prophylactically placed patient on Cipro antibiotics for reported fever.(Levaquin initially chosen interacts with patient's cardiac meds).   Advised patient to go directly to the emergency department for any worsening symptoms whatsoever.

## 2015-05-06 NOTE — Telephone Encounter (Signed)
"  I received chemotherapy last week.  Received injection yesterday and today and now my temperature is going up.  This morning my temperature = 98.8 and now it equals 102.2.  Can I take tylenol?  I just don't feel well."  Taxol 05-01-2015 with Granix injections.  Further assessment admits to chills earlier that have gone away.  Three diarrhea stools this morning.  "No vomiting but my stomach doesn't feel well.  I'm drinking broth and eating crackers."  Denies trouble urinating  Return number [phone].  Advised she needs to come in or go to the ER.  "I just want to take tylenol.  I'm on the same chemotherapy I was on before in 2014 and I know I'll feel better if I can take tylenol.  I ran temperatures before with chemotherapy."  Denies redness swelling or pain to port-a-cath.  Advised that tylenol will not hurt anything but will call with further orders and instructions.

## 2015-05-06 NOTE — Telephone Encounter (Signed)
VOICE MAIL FROM 2:13PM- PT. IS COMING NOW TO THE [facility].

## 2015-05-06 NOTE — Progress Notes (Signed)
SYMPTOM MANAGEMENT CLINIC   HPI: [name] 74 y.o. female diagnosed with ovarian cancer.  Currently undergoing carboplatin/Taxol chemotherapy regimen.  The plan is to initiate Avastin therapy as well.  Patient reports developing a fever to maximum of 102.2 earlier today.  She denies any URI symptoms, no diarrhea or constipation, no nausea or vomiting.  She states that she does have some mild stinging when she urinates.  HPI  ROS  Past Medical History  Diagnosis Date  . Hypertension   . Dyslipidemia   . Hypothyroidism   . Arthritis     OSTEOARTHRITIS   -- CONSTANT PAIN RIGHT HIP---AND PAIN LEFT KNEE--PT STATES SHE GETS INJECTIONS INTO HER KNEE  . Ovarian cancer   . PAF (paroxysmal atrial fibrillation)     Only on Plavix -- not full Anticoagulation per pt. request  . Complication of anesthesia     BLOOD PRESSURE DROPPED WITH NASAL SURGERY, ONE OF THE CARPAL TUNNEL REPAIRS AND DURING A COLONOSCOPY  . History of skin cancer   . Constipation     Past Surgical History  Procedure Laterality Date  . Bilateral carpal tunnel repair  2007  . Dilation and curettage of uterus  1969  . Surgery for ruptured ovarian cyst  1969  . Rhinoplasty for fractured nose  1986  . Left knee arthroscopy   2011  . Total hip arthroplasty  02/25/2012    Procedure: TOTAL HIP ARTHROPLASTY ANTERIOR APPROACH;  Surgeon: Mcarthur Rossetti, MD;  Location: [facility];  Service: Orthopedics;  Laterality: Right;  . Tonsillectomy  1962  . Laparotomy Bilateral 02/13/2013    Procedure: EXPLORATORY LAPAROTOMY TOTAL ABDOMINAL HYSTERECTOMY BILATERAL SALPINGO-OOPHORECTOMY, Partial Rectal Resection with Reanastamosis;  Surgeon: Alvino Chapel, MD;  Location: [facility];  Service: Gynecology;  Laterality: Bilateral;  . Omentectomy  02/13/2013    Procedure: OMENTECTOMY;  Surgeon: Alvino Chapel, MD;  Location: [facility];  Service: Gynecology;;  . Lymphadenectomy Right 02/13/2013    Procedure: PEVLIC   LYMPHADENECTOMY, DEBULKING right pelvic tumor nodules;  Surgeon: Alvino Chapel, MD;  Location: [facility];  Service: Gynecology;  Laterality: Right;  . Total abdominal hysterectomy  01/2013  . Transthoracic echocardiogram  May 2014    Normal LV size function. EF 60-65%. Grade 1 diastolic function. Mild MR and mildly elevated PA pressures of 37 mmHg per  . Nm myoview ltd  June 2010     subbmaximal with no ischemia or infarction.  . Total knee arthroplasty Left 01/03/2015    Procedure: LEFT TOTAL KNEE ARTHROPLASTY;  Surgeon: Mcarthur Rossetti, MD;  Location: [facility];  Service: Orthopedics;  Laterality: Left;    has Degenerative arthritis of hip; Elevated cancer antigen 125 (CA-125); Malignant neoplasm of ovary; Syncope, history of; History of atrial fibrillation; Essential hypertension; Hypothyroidism; Fever; Nausea; Constipation; PAF (paroxysmal atrial fibrillation); Dyslipidemia; Preop cardiovascular exam; Osteoarthritis of left knee; Status post total left knee replacement; and Hyperbilirubinemia on her problem list.    is allergic to codeine; flexeril; lisinopril; tape; and ultram.    Medication List       This list is accurate as of: 05/06/15  5:40 PM.  Always use your most recent med list.               amoxicillin 500 MG capsule  Commonly known as:  AMOXIL     aspirin EC 81 MG tablet  Take 81 mg by mouth every morning.     BENEFIBER Powd  Take 2 scoop by mouth every  morning.     ciprofloxacin 500 MG tablet  Commonly known as:  CIPRO  Take 1 tablet (500 mg total) by mouth 2 (two) times daily.     clopidogrel 75 MG tablet  Commonly known as:  PLAVIX  TAKE 1 TABLET BY MOUTH AT BEDTIME     desonide 0.05 % cream  Commonly known as:  DESOWEN  Apply 1 application topically 2 (two) times daily as needed (rash).     dexamethasone 4 MG tablet  Commonly known as:  DECADRON  Take 5 tabs with food (=5m) 12 hrs and 6 hrs prior to chemotherapy.     docusate sodium 100 MG  capsule  Commonly known as:  COLACE  Take 100 mg by mouth 2 (two) times daily.     estradiol 1 MG tablet  Commonly known as:  ESTRACE  Take 1 tablet (1 mg total) by mouth every morning. PT STATES SHE WANTS ESTRACE--NOT THE GENERIC     GLUCOSAMINE PO  Take 1,000 mg by mouth every morning.     ICY HOT EX  Apply 1 application topically daily as needed (pain.).     levothyroxine 88 MCG tablet  Commonly known as:  SYNTHROID, LEVOTHROID  Take 88 mcg by mouth every morning. Takes on an empty stomach. PT STATES SHE NEEDS THE SYNTHROID--DOES NOT WANT THE GENERIC     lidocaine-prilocaine cream  Commonly known as:  EMLA  Apply to Porta-Cath site 1-2 hours prior to access as directed.     LORazepam 1 MG tablet  Commonly known as:  ATIVAN  Place 1/2 -1 tablet under the tongue or swallow every 6 hrs as needed for nausea. Will make drowsy.     metoprolol tartrate 25 MG tablet  Commonly known as:  LOPRESSOR  Take 1.5 tablets (37.5 mg total) by mouth 2 (two) times daily.     multivitamin with minerals Tabs tablet  Take 1 tablet by mouth every morning.     ondansetron 8 MG tablet  Commonly known as:  ZOFRAN  Take 1 tablet (8 mg total) by mouth every 8 (eight) hours as needed for nausea or vomiting (Will not make drowsy).     polyethylene glycol packet  Commonly known as:  MIRALAX / GLYCOLAX  Take 17 g by mouth every morning.     potassium chloride 10 MEQ tablet  Commonly known as:  K-DUR  Take 10 mEq by mouth every Monday, Wednesday, and Friday.     propafenone 225 MG tablet  Commonly known as:  RYTHMOL  Take 1 tablet (225 mg total) by mouth 2 (two) times daily.     SYSTANE OP  Apply 1 drop to eye 2 (two) times daily.         PHYSICAL EXAMINATION  Oncology Vitals 05/06/2015 05/06/2015 05/05/2015 05/02/2015 05/01/2015 05/01/2015 05/01/2015  Height - - 160 cm - - - -  Weight 59.376 kg - 60.283 kg - - - -  Weight (lbs) 130 lbs 14 oz - 132 lbs 14 oz - - - -  BMI (kg/m2) - - 23.54 kg/m2 - - -  -  Temp 98.5 99.1 98.2 97 (No Data) 97.4 97.7  Pulse 66 75 70 67 64 70 68  Resp 16 - 18 16 18 18 20   SpO2 98 - 99 100 100 100 99  BSA (m2) - - 1.64 m2 - - - -   BP Readings from Last 3 Encounters:  05/06/15 115/56  05/06/15 136/61  05/05/15 140/72  Physical Exam  Constitutional: She is oriented to person, place, and time and well-developed, well-nourished, and in no distress.  HENT:  Head: Normocephalic and atraumatic.  Eyes: Conjunctivae and EOM are normal. Pupils are equal, round, and reactive to light. Right eye exhibits no discharge. Left eye exhibits no discharge. No scleral icterus.  Neck: Normal range of motion. Neck supple. No JVD present. No tracheal deviation present. No thyromegaly present.  Cardiovascular: Normal rate, regular rhythm, normal heart sounds and intact distal pulses.   Pulmonary/Chest: Effort normal and breath sounds normal. No respiratory distress. She has no wheezes. She has no rales. She exhibits no tenderness.  Abdominal: Soft. Bowel sounds are normal. She exhibits no distension and no mass. There is no tenderness. There is no rebound and no guarding.  Musculoskeletal: Normal range of motion. She exhibits no edema or tenderness.  Lymphadenopathy:    She has no cervical adenopathy.  Neurological: She is alert and oriented to person, place, and time. Gait normal.  Skin: Skin is warm and dry. No rash noted. No erythema. No pallor.  Psychiatric: Affect normal.  Nursing note and vitals reviewed.   LABORATORY DATA:. Appointment on 05/06/2015  Component Date Value Ref Range Status  . WBC 05/06/2015 2.5* 3.9 - 10.3 10e3/uL Final  . NEUT# 05/06/2015 2.0  1.5 - 6.5 10e3/uL Final  . HGB 05/06/2015 10.9* 11.6 - 15.9 g/dL Final  . HCT 05/06/2015 31.8* 34.8 - 46.6 % Final  . Platelets 05/06/2015 76* 145 - 400 10e3/uL Final  . MCV 05/06/2015 97.8  79.5 - 101.0 fL Final  . MCH 05/06/2015 33.5  25.1 - 34.0 pg Final  . MCHC 05/06/2015 34.3  31.5 - 36.0 g/dL Final   . RBC 05/06/2015 3.25* 3.70 - 5.45 10e6/uL Final  . RDW 05/06/2015 12.5  11.2 - 14.5 % Final  . lymph# 05/06/2015 0.5* 0.9 - 3.3 10e3/uL Final  . MONO# 05/06/2015 0.0* 0.1 - 0.9 10e3/uL Final  . Eosinophils Absolute 05/06/2015 0.0  0.0 - 0.5 10e3/uL Final  . Basophils Absolute 05/06/2015 0.0  0.0 - 0.1 10e3/uL Final  . NEUT% 05/06/2015 77.9* 38.4 - 76.8 % Final  . LYMPH% 05/06/2015 21.3  14.0 - 49.7 % Final  . MONO% 05/06/2015 0.4  0.0 - 14.0 % Final  . EOS% 05/06/2015 0.4  0.0 - 7.0 % Final  . BASO% 05/06/2015 0.0  0.0 - 2.0 % Final  . Sodium 05/06/2015 133* 136 - 145 mEq/L Final  . Potassium 05/06/2015 4.0  3.5 - 5.1 mEq/L Final  . Chloride 05/06/2015 99  98 - 109 mEq/L Final  . CO2 05/06/2015 27  22 - 29 mEq/L Final  . Glucose 05/06/2015 98  70 - 140 mg/dl Final  . BUN 05/06/2015 11.1  7.0 - 26.0 mg/dL Final  . Creatinine 05/06/2015 0.6  0.6 - 1.1 mg/dL Final  . Total Bilirubin 05/06/2015 1.51* 0.20 - 1.20 mg/dL Final  . Alkaline Phosphatase 05/06/2015 80  40 - 150 U/L Final  . AST 05/06/2015 21  5 - 34 U/L Final  . ALT 05/06/2015 17  0 - 55 U/L Final  . Total Protein 05/06/2015 5.7* 6.4 - 8.3 g/dL Final  . Albumin 05/06/2015 3.5  3.5 - 5.0 g/dL Final  . Calcium 05/06/2015 8.6  8.4 - 10.4 mg/dL Final  . Anion Gap 05/06/2015 7  3 - 11 mEq/L Final  . EGFR 05/06/2015 >90  >90 ml/min/1.73 m2 Final   eGFR is calculated using the CKD-EPI Creatinine Equation (2009)  .  Glucose 05/06/2015 Negative  Negative mg/dL Final  . Bilirubin (Urine) 05/06/2015 Negative  Negative Final  . Ketones 05/06/2015 Negative  Negative mg/dL Final  . Specific Gravity, Urine 05/06/2015 1.010  1.003 - 1.035 Final  . Blood 05/06/2015 Negative  Negative Final  . pH 05/06/2015 7.5  4.6 - 8.0 Final  . Protein 05/06/2015 30  Negative- <30 mg/dL Final  . Urobilinogen, UR 05/06/2015 0.2  0.2 - 1 mg/dL Final  . Nitrite 05/06/2015 Negative  Negative Final  . Leukocyte Esterase 05/06/2015 Trace  Negative Final  .  RBC / HPF 05/06/2015 0-2  0 - 2 Final  . WBC, UA 05/06/2015 0-2  0 - 2 Final  . Bacteria, UA 05/06/2015 Few  Negative- Trace Final  . Epithelial Cells 05/06/2015 Many  Negative- Few Final  . Mucus, UA 05/06/2015 Small  Negative- Small Final  Appointment on 05/05/2015  Component Date Value Ref Range Status  . WBC 05/05/2015 3.5* 3.9 - 10.3 10e3/uL Final  . NEUT# 05/05/2015 2.4  1.5 - 6.5 10e3/uL Final  . HGB 05/05/2015 12.1  11.6 - 15.9 g/dL Final  . HCT 05/05/2015 35.2  34.8 - 46.6 % Final  . Platelets 05/05/2015 105* 145 - 400 10e3/uL Final  . MCV 05/05/2015 98.6  79.5 - 101.0 fL Final  . MCH 05/05/2015 33.9  25.1 - 34.0 pg Final  . MCHC 05/05/2015 34.4  31.5 - 36.0 g/dL Final  . RBC 05/05/2015 3.57* 3.70 - 5.45 10e6/uL Final  . RDW 05/05/2015 12.5  11.2 - 14.5 % Final  . lymph# 05/05/2015 1.1  0.9 - 3.3 10e3/uL Final  . MONO# 05/05/2015 0.0* 0.1 - 0.9 10e3/uL Final  . Eosinophils Absolute 05/05/2015 0.0  0.0 - 0.5 10e3/uL Final  . Basophils Absolute 05/05/2015 0.0  0.0 - 0.1 10e3/uL Final  . NEUT% 05/05/2015 67.2  38.4 - 76.8 % Final  . LYMPH% 05/05/2015 31.1  14.0 - 49.7 % Final  . MONO% 05/05/2015 0.3  0.0 - 14.0 % Final  . EOS% 05/05/2015 1.1  0.0 - 7.0 % Final  . BASO% 05/05/2015 0.3  0.0 - 2.0 % Final  . Sodium 05/05/2015 137  136 - 145 mEq/L Final  . Potassium 05/05/2015 4.3  3.5 - 5.1 mEq/L Final  . Chloride 05/05/2015 100  98 - 109 mEq/L Final  . CO2 05/05/2015 30* 22 - 29 mEq/L Final  . Glucose 05/05/2015 105  70 - 140 mg/dl Final  . BUN 05/05/2015 11.2  7.0 - 26.0 mg/dL Final  . Creatinine 05/05/2015 0.6  0.6 - 1.1 mg/dL Final  . Total Bilirubin 05/05/2015 0.99  0.20 - 1.20 mg/dL Final  . Alkaline Phosphatase 05/05/2015 80  40 - 150 U/L Final  . AST 05/05/2015 21  5 - 34 U/L Final  . ALT 05/05/2015 14  0 - 55 U/L Final  . Total Protein 05/05/2015 6.0* 6.4 - 8.3 g/dL Final  . Albumin 05/05/2015 3.4* 3.5 - 5.0 g/dL Final  . Calcium 05/05/2015 8.6  8.4 - 10.4 mg/dL  Final  . Anion Gap 05/05/2015 7  3 - 11 mEq/L Final  . EGFR 05/05/2015 89* >90 ml/min/1.73 m2 Final   eGFR is calculated using the CKD-EPI Creatinine Equation (2009)     RADIOGRAPHIC STUDIES: No results found.  ASSESSMENT/PLAN:    Malignant neoplasm of ovary Patient received her first cycle of carbo plan/Taxol chemotherapy on 05/01/2015.  Labs obtained today reveal declining blood count with WBC 2.5, hemoglobin 10.9, and platelet count 76.  Patient did receive her second out of a series of 3 Granix injections today.  She has plans to return tomorrow for her third injection.  Decision was made to hold on initiation of Avastin until cycle 2 of her chemotherapy secondary to Port-A-Cath placement.  Patient is scheduled for labs only on 05/09/2015.  Patient is scheduled for labs and a follow-up visit on 05/12/2015.   Fever Patient reports developing a fever to maximum of 102.2 earlier today.  She denies any URI symptoms, no diarrhea or constipation, no nausea or vomiting.  She states that she does have some mild stinging when she urinates.  On exam.-Patient appears nontoxic; and in no acute distress.  No abdominal discomfort or flank pain.  Labs obtained today revealed WBC 2.5.  Patient received her second in a series of 3 Granix injections today.  Urinalysis obtained today revealed trace leukocytes and just a few bacteria.  Pending both urine cultures and blood cultures.  Vital signs stable while at the [facility]; and patient was afebrile with to return 98.5.  Will prophylactically placed patient on Cipro antibiotics for reported fever.(Levaquin initially chosen interacts with patient's cardiac meds).   Advised patient to go directly to the emergency department for any worsening symptoms whatsoever.   Hyperbilirubinemia Bilirubin has increased from 0.99 up to 1.51.  Patient denies any abdominal discomfort whatsoever.  She also denies any nausea or vomiting.  Will continue to  monitor closely.   Patient stated understanding of all instructions; and was in agreement with this plan of care. The patient knows to call the clinic with any problems, questions or concerns.   Review/collaboration with Dr. Marko Plume regarding all aspects of patient's visit today.   Total time spent with patient was 25 minutes;  with greater than 75 percent of that time spent in face to face counseling regarding patient's symptoms,  and coordination of care and follow up.  Disclaimer: This note was dictated with voice recognition software. Similar sounding words can inadvertently be transcribed and may not be corrected upon review.   Drue Second, NP 05/06/2015

## 2015-05-07 ENCOUNTER — Ambulatory Visit (HOSPITAL_BASED_OUTPATIENT_CLINIC_OR_DEPARTMENT_OTHER): Payer: Medicare Other

## 2015-05-07 VITALS — BP 110/62 | HR 67 | Temp 98.3°F

## 2015-05-07 DIAGNOSIS — C562 Malignant neoplasm of left ovary: Secondary | ICD-10-CM

## 2015-05-07 DIAGNOSIS — C561 Malignant neoplasm of right ovary: Secondary | ICD-10-CM

## 2015-05-07 DIAGNOSIS — Z5189 Encounter for other specified aftercare: Secondary | ICD-10-CM | POA: Diagnosis not present

## 2015-05-07 DIAGNOSIS — C569 Malignant neoplasm of unspecified ovary: Secondary | ICD-10-CM

## 2015-05-07 DIAGNOSIS — R971 Elevated cancer antigen 125 [CA 125]: Secondary | ICD-10-CM

## 2015-05-07 MED ORDER — TBO-FILGRASTIM 300 MCG/0.5ML ~~LOC~~ SOSY
300.0000 ug | PREFILLED_SYRINGE | Freq: Once | SUBCUTANEOUS | Status: AC
  Administered 2015-05-07: 300 ug via SUBCUTANEOUS
  Filled 2015-05-07: qty 0.5

## 2015-05-08 ENCOUNTER — Other Ambulatory Visit: Payer: Self-pay

## 2015-05-08 ENCOUNTER — Encounter (HOSPITAL_COMMUNITY): Payer: Self-pay | Admitting: Emergency Medicine

## 2015-05-08 ENCOUNTER — Emergency Department (HOSPITAL_COMMUNITY)
Admission: EM | Admit: 2015-05-08 | Discharge: 2015-05-08 | Disposition: A | Discharge status: Home / Self Care | Payer: Medicare Other | Attending: Emergency Medicine | Admitting: Emergency Medicine

## 2015-05-08 ENCOUNTER — Ambulatory Visit
Admission: RE | Admit: 2015-05-08 | Discharge: 2015-05-08 | Disposition: A | Discharge status: Home / Self Care | Payer: Medicare Other | Source: Ambulatory Visit | Attending: Internal Medicine | Admitting: Internal Medicine

## 2015-05-08 ENCOUNTER — Emergency Department (HOSPITAL_COMMUNITY): Payer: Medicare Other

## 2015-05-08 DIAGNOSIS — I10 Essential (primary) hypertension: Secondary | ICD-10-CM | POA: Diagnosis not present

## 2015-05-08 DIAGNOSIS — Z85828 Personal history of other malignant neoplasm of skin: Secondary | ICD-10-CM | POA: Diagnosis not present

## 2015-05-08 DIAGNOSIS — Z79899 Other long term (current) drug therapy: Secondary | ICD-10-CM | POA: Insufficient documentation

## 2015-05-08 DIAGNOSIS — Z7982 Long term (current) use of aspirin: Secondary | ICD-10-CM | POA: Diagnosis not present

## 2015-05-08 DIAGNOSIS — Z8543 Personal history of malignant neoplasm of ovary: Secondary | ICD-10-CM | POA: Diagnosis not present

## 2015-05-08 DIAGNOSIS — M1990 Unspecified osteoarthritis, unspecified site: Secondary | ICD-10-CM | POA: Diagnosis not present

## 2015-05-08 DIAGNOSIS — E041 Nontoxic single thyroid nodule: Secondary | ICD-10-CM

## 2015-05-08 DIAGNOSIS — E039 Hypothyroidism, unspecified: Secondary | ICD-10-CM | POA: Diagnosis not present

## 2015-05-08 DIAGNOSIS — C73 Malignant neoplasm of thyroid gland: Secondary | ICD-10-CM

## 2015-05-08 DIAGNOSIS — R079 Chest pain, unspecified: Secondary | ICD-10-CM | POA: Diagnosis present

## 2015-05-08 DIAGNOSIS — K5900 Constipation, unspecified: Secondary | ICD-10-CM | POA: Diagnosis not present

## 2015-05-08 DIAGNOSIS — R0789 Other chest pain: Secondary | ICD-10-CM | POA: Diagnosis not present

## 2015-05-08 LAB — I-STAT TROPONIN, ED
TROPONIN I, POC: 0 ng/mL (ref 0.00–0.08)
TROPONIN I, POC: 0 ng/mL (ref 0.00–0.08)

## 2015-05-08 LAB — BASIC METABOLIC PANEL
Anion gap: 5 (ref 5–15)
BUN: 14 mg/dL (ref 6–20)
CO2: 28 mmol/L (ref 22–32)
Calcium: 8.7 mg/dL — ABNORMAL LOW (ref 8.9–10.3)
Chloride: 100 mmol/L — ABNORMAL LOW (ref 101–111)
Creatinine, Ser: 0.51 mg/dL (ref 0.44–1.00)
GFR calc Af Amer: >60 mL/min (ref >60)
Glucose, Bld: 101 mg/dL — ABNORMAL HIGH (ref 65–99)
Potassium: 4.1 mmol/L (ref 3.5–5.1)
Sodium: 133 mmol/L — ABNORMAL LOW (ref 135–145)

## 2015-05-08 LAB — CBC
HCT: 33.1 % — ABNORMAL LOW (ref 36.0–46.0)
Hemoglobin: 11.2 g/dL — ABNORMAL LOW (ref 12.0–15.0)
MCH: 32.8 pg (ref 26.0–34.0)
MCHC: 33.8 g/dL (ref 30.0–36.0)
MCV: 97.1 fL (ref 78.0–100.0)
Platelets: 61 10*3/uL — ABNORMAL LOW (ref 150–400)
RBC: 3.41 MIL/uL — ABNORMAL LOW (ref 3.87–5.11)
RDW: 12.4 % (ref 11.5–15.5)
WBC: 2.1 10*3/uL — ABNORMAL LOW (ref 4.0–10.5)

## 2015-05-08 LAB — URINE CULTURE

## 2015-05-08 MED ORDER — GI COCKTAIL ~~LOC~~
30.0000 mL | Freq: Once | ORAL | Status: AC
  Administered 2015-05-08: 30 mL via ORAL
  Filled 2015-05-08: qty 30

## 2015-05-08 MED ORDER — ASPIRIN 81 MG PO CHEW
324.0000 mg | CHEWABLE_TABLET | Freq: Once | ORAL | Status: AC
  Administered 2015-05-08: 324 mg via ORAL
  Filled 2015-05-08: qty 4

## 2015-05-08 NOTE — ED Notes (Signed)
Pt wants labs drawn off her port if an IV is ordered, but if it isn't then she wants labs drawn regularly. Per pt she wants to see what the doctor orders first before labs are drawn.

## 2015-05-08 NOTE — ED Notes (Signed)
MD at bedside. 

## 2015-05-08 NOTE — Discharge Instructions (Signed)
You may try zantac or pepcid for the gas problems,  Please call your doctor for a followup appointment within 24-48 hours. When you talk to your doctor please let them know that you were seen in the emergency department and have them acquire all of your records so that they can discuss the findings with you and formulate a treatment plan to fully care for your new and ongoing problems.

## 2015-05-08 NOTE — ED Notes (Signed)
Pt c/o central chest pain that started around 4 pm today.  Pt thought it was indigestion at first but not getting better. Pt is on 2 types of chemo and chest pain is the side effect of both.  Pt also has PMH afib and states that chest feels similar to that.  Pt denies any n/v, shob, diziness or radiation of with chest pain.

## 2015-05-08 NOTE — ED Notes (Signed)
Bed: WA08 Expected date:  Expected time:  Means of arrival:  Comments: Hold triage 1 

## 2015-05-08 NOTE — ED Provider Notes (Signed)
CSN: [id number]     Arrival date & time 05/08/15  1750 History   First MD Initiated Contact with Patient 05/08/15 1828     Chief Complaint  Patient presents with  . Chest Pain  . chemo card      (Consider location/radiation/quality/duration/timing/severity/associated sxs/prior Treatment) HPI Comments: The patient is a 74 year old female, she has a history of ovarian cancer which has recently recurred, she is currently undergoing chemotherapy, she recently restarted. She reports that she developed acute onset of a throbbing chest pain across her upper chest, left and right-sided as well as the middle of the chest which started approximately 3 hours ago, has been constant, was initially very severe but has gradually improved, currently 5 out of 10. There is no associated fevers, chills, cough, shortness of breath, swelling of the legs, recent surgery or trauma. She has no history of cardiac disease other than atrial fibrillation for which she takes Plavix and baby aspirin daily. She also takes Rythmol and other blood pressure medications. She is not a smoker, she is not a diabetic and has no high cholesterol. She does report having a stress test many many years ago, she is unsure when that was, she is unaware of having any other obstructive coronary disease.  Patient is a 74 y.o. female presenting with chest pain. The history is provided by the patient and the spouse.  Chest Pain   Past Medical History  Diagnosis Date  . Hypertension   . Dyslipidemia   . Hypothyroidism   . Arthritis     OSTEOARTHRITIS   -- CONSTANT PAIN RIGHT HIP---AND PAIN LEFT KNEE--PT STATES SHE GETS INJECTIONS INTO HER KNEE  . Ovarian cancer   . PAF (paroxysmal atrial fibrillation)     Only on Plavix -- not full Anticoagulation per pt. request  . Complication of anesthesia     BLOOD PRESSURE DROPPED WITH NASAL SURGERY, ONE OF THE CARPAL TUNNEL REPAIRS AND DURING A COLONOSCOPY  . History of skin cancer   . Constipation     Past Surgical History  Procedure Laterality Date  . Bilateral carpal tunnel repair  2007  . Dilation and curettage of uterus  1969  . Surgery for ruptured ovarian cyst  1969  . Rhinoplasty for fractured nose  1986  . Left knee arthroscopy   2011  . Total hip arthroplasty  02/25/2012    Procedure: TOTAL HIP ARTHROPLASTY ANTERIOR APPROACH;  Surgeon: Mcarthur Rossetti, MD;  Location: [facility];  Service: Orthopedics;  Laterality: Right;  . Tonsillectomy  1962  . Laparotomy Bilateral 02/13/2013    Procedure: EXPLORATORY LAPAROTOMY TOTAL ABDOMINAL HYSTERECTOMY BILATERAL SALPINGO-OOPHORECTOMY, Partial Rectal Resection with Reanastamosis;  Surgeon: Alvino Chapel, MD;  Location: [facility];  Service: Gynecology;  Laterality: Bilateral;  . Omentectomy  02/13/2013    Procedure: OMENTECTOMY;  Surgeon: Alvino Chapel, MD;  Location: [facility];  Service: Gynecology;;  . Lymphadenectomy Right 02/13/2013    Procedure: PEVLIC  LYMPHADENECTOMY, DEBULKING right pelvic tumor nodules;  Surgeon: Alvino Chapel, MD;  Location: [facility];  Service: Gynecology;  Laterality: Right;  . Total abdominal hysterectomy  01/2013  . Transthoracic echocardiogram  May 2014    Normal LV size function. EF 60-65%. Grade 1 diastolic function. Mild MR and mildly elevated PA pressures of 37 mmHg per  . Nm myoview ltd  June 2010     subbmaximal with no ischemia or infarction.  . Total knee arthroplasty Left 01/03/2015    Procedure: LEFT TOTAL KNEE ARTHROPLASTY;  Surgeon: Mcarthur Rossetti, MD;  Location: [facility];  Service: Orthopedics;  Laterality: Left;   Family History  Problem Relation Age of Onset  . Hypertension Mother    History  Substance Use Topics  . Smoking status: Never Smoker   . Smokeless tobacco: Never Used  . Alcohol Use: No   OB History    Gravida Para Term Preterm AB TAB SAB Ectopic Multiple Living   3 2   1  1  1 3      Review of Systems  Cardiovascular: Positive for chest pain.   All other systems reviewed and are negative.     Allergies  Ceftin; Codeine; Flexeril; Levaquin; Lisinopril; Tape; and Ultram  Home Medications   Prior to Admission medications   Medication Sig Start Date End Date Taking? Authorizing Provider  amoxicillin (AMOXIL) 500 MG capsule  02/26/15  Yes Historical Provider, MD  aspirin EC 81 MG tablet Take 81 mg by mouth every morning.    Yes Historical Provider, MD  ciprofloxacin (CIPRO) 500 MG tablet Take 1 tablet (500 mg total) by mouth 2 (two) times daily. 05/06/15  Yes Susanne Borders, NP  clopidogrel (PLAVIX) 75 MG tablet TAKE 1 TABLET BY MOUTH AT BEDTIME Patient taking differently: Take 1 tablet by mouth at bedtime. 03/14/15  Yes Leonie Man, MD  desonide (DESOWEN) 0.05 % cream Apply 1 application topically 2 (two) times daily as needed (rash).  12/23/14  Yes Historical Provider, MD  dexamethasone (DECADRON) 4 MG tablet Take 5 tabs with food (=20mg ) 12 hrs and 6 hrs prior to chemotherapy. 04/22/15  Yes Lennis Marion Downer, MD  docusate sodium (COLACE) 100 MG capsule Take 100 mg by mouth 2 (two) times daily as needed for mild constipation.    Yes Historical Provider, MD  estradiol (ESTRACE) 1 MG tablet Take 1 tablet (1 mg total) by mouth every morning. PT STATES SHE WANTS ESTRACE--NOT THE GENERIC 03/28/15  Yes Marti Sleigh, MD  Glucosamine HCl (GLUCOSAMINE PO) Take 1,000 mg by mouth every morning.    Yes Historical Provider, MD  levothyroxine (SYNTHROID, LEVOTHROID) 88 MCG tablet Take 88 mcg by mouth every morning. Takes on an empty stomach. PT STATES SHE NEEDS THE SYNTHROID--DOES NOT WANT THE GENERIC   Yes Historical Provider, MD  lidocaine-prilocaine (EMLA) cream Apply to Porta-Cath site 1-2 hours prior to access as directed. 04/22/15  Yes Lennis Marion Downer, MD  LORazepam (ATIVAN) 1 MG tablet Place 1/2 -1 tablet under the tongue or swallow every 6 hrs as needed for nausea. Will make drowsy. 04/22/15  Yes Lennis Marion Downer, MD  Menthol,  Topical Analgesic, (ICY HOT EX) Apply 1 application topically daily as needed (pain.).   Yes Historical Provider, MD  metoprolol tartrate (LOPRESSOR) 25 MG tablet Take 1.5 tablets (37.5 mg total) by mouth 2 (two) times daily. 03/24/15  Yes Leonie Man, MD  Multiple Vitamin (MULTIVITAMIN WITH MINERALS) TABS tablet Take 1 tablet by mouth every morning.    Yes Historical Provider, MD  ondansetron (ZOFRAN) 8 MG tablet Take 1 tablet (8 mg total) by mouth every 8 (eight) hours as needed for nausea or vomiting (Will not make drowsy). 04/22/15  Yes Lennis Marion Downer, MD  Polyethyl Glycol-Propyl Glycol (SYSTANE OP) Place 1 drop into both eyes 2 (two) times daily.    Yes Historical Provider, MD  polyethylene glycol (MIRALAX / GLYCOLAX) packet Take 17 g by mouth every morning.    Yes Historical Provider, MD  potassium chloride (K-DUR) 10 MEQ tablet  Take 10 mEq by mouth every Monday, Wednesday, and Friday. 05/05/13  Yes Shon Baton, MD  [place]; [place].   Yes Historical Provider, MD  propafenone (RYTHMOL) 225 MG tablet Take 1 tablet (225 mg total) by mouth 2 (two) times daily. 12/12/14  Yes Leonie Man, MD  Wheat Dextrin [facility]) POWD Take 2 scoop by mouth every morning.    Yes Historical Provider, MD   BP 129/69 mmHg  Pulse 58  Temp(Src) 98.1 F (36.7 C) (Oral)  Resp 17  SpO2 96% Physical Exam  Constitutional: She appears well-developed and well-nourished. No distress.  HENT:  Head: Normocephalic and atraumatic.  Mouth/Throat: Oropharynx is clear and moist. No oropharyngeal exudate.  Eyes: Conjunctivae and EOM are normal. Pupils are equal, round, and reactive to light. Right eye exhibits no discharge. Left eye exhibits no discharge. No scleral icterus.  Neck: Normal range of motion. Neck supple. No JVD present. No thyromegaly present.  Cardiovascular: Normal rate, regular rhythm, normal heart sounds and intact distal pulses.  Exam reveals no gallop and no friction rub.   No  murmur heard. Pulmonary/Chest: Effort normal and breath sounds normal. No respiratory distress. She has no wheezes. She has no rales.  Abdominal: Soft. Bowel sounds are normal. She exhibits no distension and no mass. There is no tenderness.  Musculoskeletal: Normal range of motion. She exhibits no edema or tenderness.  Lymphadenopathy:    She has no cervical adenopathy.  Neurological: She is alert. Coordination normal.  Skin: Skin is warm and dry. No rash noted. No erythema.  Psychiatric: She has a normal mood and affect. Her behavior is normal.  Nursing note and vitals reviewed.   ED Course  Procedures (including critical care time) Labs Review Labs Reviewed  CBC - Abnormal; Notable for the following:    WBC 2.1 (*)    RBC 3.41 (*)    Hemoglobin 11.2 (*)    HCT 33.1 (*)    Platelets 61 (*)    All other components within normal limits  BASIC METABOLIC PANEL - Abnormal; Notable for the following:    Sodium 133 (*)    Chloride 100 (*)    Glucose, Bld 101 (*)    Calcium 8.7 (*)    All other components within normal limits  I-STAT TROPOININ, ED  Randolm Idol, ED    Imaging Review US Soft Tissue Head/neck  05/08/2015   ADDENDUM REPORT: 05/08/2015 11:58  ADDENDUM: Comparison to the PET-CT from 04/03/2015 is not performed:  There is a focal right lower pole nodule as well as a focal isthmic nodule. The accompanying PET-CT demonstrates diffuse activity throughout the right lobe as well as the isthmus. Because activity is diffuse, likelihood of malignancy in the nodules is low. However, fine-needle aspiration of these 2 nodules can be performed if there is high suspicion for malignancy in the thyroid gland.   Electronically Signed   By: Marybelle Killings M.D.   On: 05/08/2015 11:58   05/08/2015   CLINICAL DATA:  Ovarian cancer  EXAM: THYROID ULTRASOUND  TECHNIQUE: Ultrasound examination of the thyroid gland and adjacent soft tissues was performed.  COMPARISON:  None.  FINDINGS: Right thyroid  lobe  Measurements: 4.8 x 2.1 x 1.87.  No nodules visualized.  Left thyroid lobe  Measurements: 4.4 x 1.1 x 1.0 cm. Posterior right lower pole nodule measures 1.0 x 1.1 x 0.7 cm.  Isthmus  Thickness: 6 mm.  Right isthmic nodule measures 1.2 x 0.7 x 1.2 cm.  Lymphadenopathy  None visualized.  IMPRESSION: There are small isthmic and right lobe nodules as described. The largest nodule measures 1.2 cm. Findings do not meet current SRU consensus criteria for biopsy. Follow-up by clinical exam is recommended. If patient has known risk factors for thyroid carcinoma, consider follow-up ultrasound in 12 months. If patient is clinically hyperthyroid, consider nuclear medicine thyroid uptake and scan.Reference: Management of Thyroid Nodules Detected at [place]: Society of Radiologists in [place]. Radiology 2005; [id number].  Electronically Signed: By: Marybelle Killings M.D. On: 05/08/2015 11:53   Dg Chest Port 1 View  05/08/2015   CLINICAL DATA:  Mid chest pain and history of ovarian carcinoma.  EXAM: PORTABLE CHEST - 1 VIEW  COMPARISON:  12/30/2014  FINDINGS: Right jugular Port-A-Cath shows appropriate positioning with the catheter tip in the distal SVC. There is no evidence of pulmonary edema, consolidation, pneumothorax, nodule or pleural fluid. The heart size and mediastinal contours are stable and within normal limits. No bony abnormalities are seen.  IMPRESSION: No active disease.   Electronically Signed   By: Aletta Edouard M.D.   On: 05/08/2015 18:50    ED ECG REPORT  I personally interpreted this EKG   Date: 05/08/2015   Rate: 70  Rhythm: normal sinus rhythm  QRS Axis: normal  Intervals: normal  ST/T Wave abnormalities: normal  Conduction Disutrbances:none  Narrative Interpretation: 1st deg AV block  Old EKG Reviewed: unchanged   MDM   Final diagnoses:  Other chest pain    The patient has no reproducible chest tenderness, her EKG is unremarkable, there is no signs of ST  elevations or depressions, she is in normal sinus rhythm with what appears to be a first-degree AV block, overall this is concerning however her symptoms are continuing and abnormal for her. She thought it was acid reflux, it did not respond when she drank Maalox, may need admission for cardiac rule out.  I discussed with the family at length the indications for return and further evaluation. She states that she does not want to be admitted to the hospital despite my strong recommendation that this would be the case. I calculation the patient has a heart score of 3, she states that her symptoms are completely resolved after medications and observation. She has had 2 normal troponins, she states that she is willing to see her cardiologist within 3 days for follow-up, at this time the patient  once to be discharged home, will have her follow up very closely, she is amenable to the plan, I have recommended that she return to the ER immediately if her symptoms worsen, she is able to this plan.  Meds given in ED:  Medications  aspirin chewable tablet 324 mg (324 mg Oral Given 05/08/15 1845)  gi cocktail (Maalox,Lidocaine,Donnatal) (30 mLs Oral Given 05/08/15 1845)      Noemi Chapel, MD 05/08/15 2127

## 2015-05-09 ENCOUNTER — Other Ambulatory Visit: Payer: Self-pay

## 2015-05-09 ENCOUNTER — Telehealth: Payer: Self-pay | Admitting: Cardiology

## 2015-05-09 ENCOUNTER — Other Ambulatory Visit (HOSPITAL_BASED_OUTPATIENT_CLINIC_OR_DEPARTMENT_OTHER): Payer: Medicare Other

## 2015-05-09 ENCOUNTER — Ambulatory Visit (HOSPITAL_BASED_OUTPATIENT_CLINIC_OR_DEPARTMENT_OTHER): Payer: Medicare Other

## 2015-05-09 ENCOUNTER — Other Ambulatory Visit: Payer: Self-pay | Admitting: Oncology

## 2015-05-09 ENCOUNTER — Other Ambulatory Visit: Payer: Self-pay | Admitting: Internal Medicine

## 2015-05-09 VITALS — BP 143/59 | HR 66 | Temp 98.3°F

## 2015-05-09 DIAGNOSIS — C561 Malignant neoplasm of right ovary: Secondary | ICD-10-CM

## 2015-05-09 DIAGNOSIS — Z5189 Encounter for other specified aftercare: Secondary | ICD-10-CM

## 2015-05-09 DIAGNOSIS — C569 Malignant neoplasm of unspecified ovary: Secondary | ICD-10-CM

## 2015-05-09 DIAGNOSIS — R971 Elevated cancer antigen 125 [CA 125]: Secondary | ICD-10-CM

## 2015-05-09 DIAGNOSIS — C562 Malignant neoplasm of left ovary: Secondary | ICD-10-CM | POA: Diagnosis not present

## 2015-05-09 DIAGNOSIS — D702 Other drug-induced agranulocytosis: Secondary | ICD-10-CM

## 2015-05-09 DIAGNOSIS — R490 Dysphonia: Secondary | ICD-10-CM

## 2015-05-09 LAB — CBC WITH DIFFERENTIAL/PLATELET
BASO%: 0.8 % (ref 0.0–2.0)
BASOS ABS: 0 10*3/uL (ref 0.0–0.1)
EOS%: 1.1 % (ref 0.0–7.0)
Eosinophils Absolute: 0 10*3/uL (ref 0.0–0.5)
HCT: 30.1 % — ABNORMAL LOW (ref 34.8–46.6)
HGB: 10.3 g/dL — ABNORMAL LOW (ref 11.6–15.9)
LYMPH#: 1.5 10*3/uL (ref 0.9–3.3)
LYMPH%: 58 % — AB (ref 14.0–49.7)
MCH: 33.4 pg (ref 25.1–34.0)
MCHC: 34.2 g/dL (ref 31.5–36.0)
MCV: 97.7 fL (ref 79.5–101.0)
MONO#: 0.8 10*3/uL (ref 0.1–0.9)
MONO%: 30.2 % — AB (ref 0.0–14.0)
NEUT%: 9.9 % — ABNORMAL LOW (ref 38.4–76.8)
NEUTROS ABS: 0.3 10*3/uL — AB (ref 1.5–6.5)
PLATELETS: 73 10*3/uL — AB (ref 145–400)
RBC: 3.08 10*6/uL — ABNORMAL LOW (ref 3.70–5.45)
RDW: 12.7 % (ref 11.2–14.5)
WBC: 2.6 10*3/uL — AB (ref 3.9–10.3)

## 2015-05-09 LAB — TECHNOLOGIST REVIEW

## 2015-05-09 MED ORDER — TBO-FILGRASTIM 300 MCG/0.5ML ~~LOC~~ SOSY
300.0000 ug | PREFILLED_SYRINGE | Freq: Once | SUBCUTANEOUS | Status: AC
  Administered 2015-05-09: 300 ug via SUBCUTANEOUS
  Filled 2015-05-09: qty 0.5

## 2015-05-09 MED ORDER — CIPROFLOXACIN HCL 250 MG PO TABS: 250.0000 mg | ORAL_TABLET | Freq: Two times a day (BID) | ORAL | Status: DC

## 2015-05-09 NOTE — Telephone Encounter (Signed)
OKAY FOR PATIENT TO SEE NEXT EXTENDER AVAILABLE TIME SLOT

## 2015-05-09 NOTE — Progress Notes (Signed)
Reviewed neutropenic precautions as ANC 0.3 today.  Pt agreed to wear a mask.  Given some extra masks  for home.  She will return to [facility] tomorrow for another granix injection.  She is to continue on the cipro until  WBC count recovers.  Pt has chemo alert card and knows to go to the ED if she has a temp of 100.5 or greater.  She s to begin Claritin 10 mg daily to help with aches from granix injections. She is going to pick up some gas-ex as well. She feels that the chest pain yesterday was gas related. No bleeding noted as plt count 73K today. Pt. To keep appointment with Dr. Gara Kroner on 05-12-15 as scheduled.  [name] verbalized understanding.

## 2015-05-09 NOTE — Telephone Encounter (Signed)
Pt has been called and notified of her appt with Kerin Ransom on 7/6 at 8:00am as a post ER f/u

## 2015-05-09 NOTE — Telephone Encounter (Signed)
Pt called in stating that she went to the ER last night for what she thought was chest pain but it ended up being gas. She was instructed to follow up with Dr.Harding soon. Please call  Thanks   - Or should I just schedule her for the new available APP or add her to his Qtr day on the 14th

## 2015-05-09 NOTE — Telephone Encounter (Signed)
Sounds good DH 

## 2015-05-10 ENCOUNTER — Ambulatory Visit (HOSPITAL_BASED_OUTPATIENT_CLINIC_OR_DEPARTMENT_OTHER): Payer: Medicare Other

## 2015-05-10 VITALS — BP 125/54 | HR 73 | Temp 97.1°F

## 2015-05-10 DIAGNOSIS — Z5189 Encounter for other specified aftercare: Secondary | ICD-10-CM | POA: Diagnosis not present

## 2015-05-10 DIAGNOSIS — C561 Malignant neoplasm of right ovary: Secondary | ICD-10-CM | POA: Diagnosis not present

## 2015-05-10 DIAGNOSIS — C562 Malignant neoplasm of left ovary: Secondary | ICD-10-CM

## 2015-05-10 DIAGNOSIS — R971 Elevated cancer antigen 125 [CA 125]: Secondary | ICD-10-CM

## 2015-05-10 DIAGNOSIS — C569 Malignant neoplasm of unspecified ovary: Secondary | ICD-10-CM

## 2015-05-10 MED ORDER — TBO-FILGRASTIM 300 MCG/0.5ML ~~LOC~~ SOSY
300.0000 ug | PREFILLED_SYRINGE | Freq: Once | SUBCUTANEOUS | Status: AC
  Administered 2015-05-10: 300 ug via SUBCUTANEOUS

## 2015-05-10 NOTE — Patient Instructions (Signed)
Neutropenia Neutropenia is a condition that occurs when the level of a certain type of white blood cell (neutrophil) in your body becomes lower than normal. Neutrophils are made in the bone marrow and fight infections. These cells protect against bacteria and viruses. The fewer neutrophils you have, and the longer your body remains without them, the greater your risk of getting a severe infection becomes. CAUSES  The cause of neutropenia may be hard to determine. However, it is usually due to 3 main problems:   Decreased production of neutrophils. This may be due to:  Certain medicines such as chemotherapy.  Genetic problems.  Cancer.  Radiation treatments.  Vitamin deficiency.  Some pesticides.  Increased destruction of neutrophils. This may be due to:  Overwhelming infections.  Hemolytic anemia. This is when the body destroys its own blood cells.  Chemotherapy.  Neutrophils moving to areas of the body where they cannot fight infections. This may be due to:  Dialysis procedures.  Conditions where the spleen becomes enlarged. Neutrophils are held in the spleen and are not available to the rest of the body.  Overwhelming infections. The neutrophils are held in the area of the infection and are not available to the rest of the body. SYMPTOMS  There are no specific symptoms of neutropenia. The lack of neutrophils can result in an infection, and an infection can cause various problems. DIAGNOSIS  Diagnosis is made by a blood test. A complete blood count is performed. The normal level of neutrophils in human blood differs with age and race. Infants have lower counts than older children and adults. African Americans have lower counts than Caucasians or Asians. The average adult level is 1500 cells/mm3 of blood. Neutrophil counts are interpreted as follows:  Greater than 1000 cells/mm3 gives normal protection against infection.  500 to 1000 cells/mm3 gives an increased risk for  infection.  200 to 500 cells/mm3 is a greater risk for severe infection.  Lower than 200 cells/mm3 is a marked risk of infection. This may require hospitalization and treatment with antibiotic medicines. TREATMENT  Treatment depends on the underlying cause, severity, and presence of infections or symptoms. It also depends on your health. Your caregiver will discuss the treatment plan with you. Mild cases are often easily treated and have a good outcome. Preventative measures may also be started to limit your risk of infections. Treatment can include:  Taking antibiotics.  Stopping medicines that are known to cause neutropenia.  Correcting nutritional deficiencies by eating green vegetables to supply folic acid and taking vitamin B supplements.  Stopping exposure to pesticides if your neutropenia is related to pesticide exposure.  Taking a blood growth factor called sargramostim, pegfilgrastim, or filgrastim if you are undergoing chemotherapy for cancer. This stimulates white blood cell production.  Removal of the spleen if you have Felty's syndrome and have repeated infections. HOME CARE INSTRUCTIONS   Follow your caregiver's instructions about when you need to have blood work done.  Wash your hands often. Make sure others who come in contact with you also wash their hands.  Wash raw fruits and vegetables before eating them. They can carry bacteria and fungi.  Avoid people with colds or spreadable (contagious) diseases (chickenpox, herpes zoster, influenza).  Avoid large crowds.  Avoid construction areas. The dust can release fungus into the air.  Be cautious around children in daycare or school environments.  Take care of your respiratory system by coughing and deep breathing.  Bathe daily.  Protect your skin from cuts and   burns.  Do not work in the garden or with flowers and plants.  Care for the mouth before and after meals by brushing with a soft toothbrush. If you have  mucositis, do not use mouthwash. Mouthwash contains alcohol and can dry out the mouth even more.  Clean the area between the genitals and the anus (perineal area) after urination and bowel movements. Women need to wipe from front to back.  Use a water soluble lubricant during sexual intercourse and practice good hygiene after. Do not have intercourse if you are severely neutropenic. Check with your caregiver for guidelines.  Exercise daily as tolerated.  Avoid people who were vaccinated with a live vaccine in the past 30 days. You should not receive live vaccines (polio, typhoid).  Do not provide direct care for pets. Avoid animal droppings. Do not clean litter boxes and bird cages.  Do not share food utensils.  Do not use tampons, enemas, or rectal suppositories unless directed by your caregiver.  Use an electric razor to remove hair.  Wash your hands after handling magazines, letters, and newspapers. SEEK IMMEDIATE MEDICAL CARE IF:   You have a fever.  You have chills or start to shake.  You feel nauseous or vomit.  You develop mouth sores.  You develop aches and pains.  You have redness and swelling around open wounds.  Your skin is warm to the touch.  You have pus coming from your wounds.  You develop swollen lymph nodes.  You feel weak or fatigued.  You develop red streaks on the skin. MAKE SURE YOU:  Understand these instructions.  Will watch your condition.  Will get help right away if you are not doing well or get worse. Document Released: 05/07/2002 Document Revised: 02/07/2012 Document Reviewed: 06/04/2011 [facility] Patient Information 2015 [place], [place]. This information is not intended to replace advice given to you by your health care provider. Make sure you discuss any questions you have with your health care provider. Food Safety for the Immunocompromised Person Food safety is important for people who are immunocompromised. Immunocompromised means  that the immune system is impaired or weakened (as by drugs or illness). This diet is often recommended before and after certain cancer treatments and after an organ or bone marrow transplant. It is important to follow these food safety guidelines for as long as your dietitian or caregiver instructs you to. These food safety guidelines are sometimes called the neutropenic diet or low-microbial diet. Bacteria and other harmful microorganisms are more likely to be present in raw or fresh foods. Thoroughly cooking foods destroys these microorganisms. For example, fresh vegetables should be cooked until tender; meats should be cooked until well-done; and eggs should be cooked until the yolks are firm. Also, certain food products are treated with a method known as pasteurization. Pasteurization briefly exposes food to high heat that kills any bacteria. Look for dairy products, juices, and ciders that have the word "pasteurized" on the label.  GENERAL GUIDELINES  Check expiration dates on all products before you buy them. Nothing you buy should be past the expiration date.  Wash the following items with soap and hot water before and after touching food:  Countertops.  Cutting boards (wash these in a dishwasher if you have one).  All cooking utensils.  All silverware.  All pots and pans.  Before preparing food, wash your hands frequently with warm, soapy water and dry your hands with paper towels. This is especially important after touching raw meat, eggs, and  fish.  Wash dishes in hot, soapy water or in a dishwasher. Air dry dishes. Do not use a cloth towel.  Keep perishable food very hot or very cold. Do not leave perishable items at room temperature for more than 10-15 minutes.  All perishable foods should be cooked thoroughly. No rare meat should be eaten.  Wash fruits and vegetables thoroughly under cold running water before peeling or cutting. Individually scrub produce that has a thick,  rough skin or rind, such as lettuce, spinach, or cabbage. Do not use commercial rinses to wash fruits and vegetables.   Packaged salads, slaw mix, and other prepared produce (even marked "prewashed") should be rinsed again under cold running water.   If consuming unpasteurized or fresh tofu, cut tofu into 1 inch (or smaller) cubes. Then, boil the tofu for at least 5 minutes in water or broth before eating or using the tofu in recipes.  Use distilled or bottled water if you are using a water service other than the city water service.  Thaw frozen foods in the refrigerator overnight or quickly in the microwave. Do not thaw food on the countertop.  Refrigerate leftovers promptly in airtight containers.  Use leftovers only if they have been stored properly and have been around for no more than 24 hours.  When food shopping, avoid salad bars, bulk food bins, food samples, and snacks that are out in the open.  When dining out:  Avoid salad bars, delis, and buffets.  Use single-serve condiments, such as ketchup, mustard, mayonnaise, soy sauce, steak sauce, salt, pepper, and sugar. Check with your caregiver after blood work is done to see when your neutropenic diet can be modified with fewer restrictions. SPECIFIC EXAMPLE GUIDELINES Beverages  Allowed: Boiled well water. Bottled spring, distilled, and natural water. Tap water and ice made from bottled or tap water. All canned, bottled, powdered beverages. Instant and brewed coffee, tea; cold-brewed tea made with boiling water. Brewed herbal teas using commercially-packaged tea bags. Liquid and powdered commercial nutritional supplements. Other beverages not listed below.   Avoid: Unboiled well water.Cold-brewed tea made with warm or cold water. Raw, unpasteurized milk. Unpasteurized fruit and vegetable juices. Mat tea. Eggnog or milkshakes made with raw eggs. Fresh apple cider.  Meat, Fish, Eggs, Poultry  Allowed: All thoroughly-cooked  or canned meats: beef, pork, lamb, poultry, fish, shellfish, game, ham, bacon, sausage, hot dogs. Thoroughly cooked pasteurized egg substitutes and eggs (egg white cooked firm with thickened yellow yolk acceptable). Commercially packaged salami, bologna, and other luncheon meats. Hot dogs should be heated until steaming (165F [73.9C]). Cooked tofu. Prepackaged peanut butter.  Avoid: Uncooked or rare meat, fish, eggs, or poultry. Commercially prepared meat and fish salads. Sushi. Raw or undercooked meat, poultry, fish, game, tofu. Raw or undercooked eggs and egg substitutes. Unheated meats and cold cuts from the deli. Hard cured salami in natural wrap. Cold-smoked salmon, lox. Pickled fish. [place] products. Dairy Products  Allowed: Pasteurized, grade "A" milk or milk products or lactose-free milk or yogurt. Pasteurized yogurt or frozen yogurt. Prepackaged ice cream, sherbet, ice cream bars, homemade milkshakes. Prepackaged and pasteurized hard cheeses, such as cheddar, [place], [place], or Swiss. Prepackaged soft cheeses, such as cottage cheese, cream cheese, or ricotta. Dry, refrigerated, and frozen pasteurized whipped topping. Commercial nutritional supplements. Pasteurized eggnog. Pasteurized sour cream.  Avoid: Soft-serve ice cream or frozen yogurt. Hand-packed ice cream or frozen yogurt. Feta, brie, camembert, blue, gorgonzola, Stilton, Roquefort, farmer's cheese, and queso fresco cheeses. Any imported cheeses and  any cheese sliced at a deli.Unpasteurized or raw milk cheese, yogurt, and other milk products. Cheeses containing chili peppers or other uncooked vegetables. Breads, Cereals, Rice, Potatoes, Pasta  Allowed: All prepackaged or homemade breads, bagels, rolls, pancakes, sweet rolls, waffles, [place] toast, muffins, cakes, donuts, cookies, crackers. All boxed hot or cold cereals. Cooked potatoes, rice, noodles, other grains. Potato chips, corn chips, tortilla chips, pretzels,  popcorn.  Avoid: Fresh bakery breads, muffins, cakes, donuts, cream or custard filled cakes. Raw grain products. Vegetables and Fruits  Allowed: All frozen, canned, and washed raw vegetables that have been cooked. All cooked or canned fruits. Raw, well-washed and non-bruised fruits. Pasteurized fruit juices. Canned and stewed fruit. Dried fruits.  Avoid: Unwashedraw vegetables and salads. All raw vegetable sprouts (alfalfa, radish, broccoli, mung bean, all others). Salads from the deli.Prepackaged salsas stored in refrigerated case. Unwashed raw fruits. Unpasteurized fruit and vegetable juices. Nuts  Allowed: Processed peanut butter. Canned or bottled roasted nuts. Nuts in baked products.  Avoid:Unroasted raw nuts. Unprocessed nuts. Roasted nuts in the shell. Condiments and Spices  Allowed: All cooked, fresh, or canned spices (add at least 5 minutes before cooking ends). Thoroughly washed fresh herbs and spices. Ketchup, mustard, BBQ sauce, soy sauce, and mayonnaise served in separate containers with clean utensils, refrigerated after opening. Sugar, jelly, and honey served from clean containers with clean utensils.   Avoid: Uncooked spices. Raw honey. Anything from a family container that is not freshly washed.  Desserts  Allowed:Refrigerated commercial and homemade cakes, pies, pastries, and pudding. Refrigerated cream-filled pastries. Homemade and commercial cookies. Shelf-stable cream-filled cupcakes, fruit pies, and canned pudding. Ices, popsicle-like products.  Avoid: Unrefrigerated, cream-filled pastry products (not shelf-stable). Fats  Allowed: Oil, shortening, refrigerated lard, margarine, butter. Commercial or shelf-stable mayonnaise and salad dressings (including cheese-based salad dressings, refrigerated after opening). Cooked gravy and sauces.  Avoid: Fresh salad dressings containing aged cheese (blue cheese, Roquefort) or raw eggs, stored in refrigerated  case. Restaurant Foods and Miscellaneous  Allowed:Thoroughly cooked frozen dinners. Thoroughly cooked frozen pizza. Canned entrees.   Avoid: Eating at restaurants while in neutropenia or using take-out deli food even if it is behind the counter. Avoid all salad bars while you are neutropenic. Avoid all self-serve buffets while you are neutropenic.  Ask your caregiver for information or recommendations regarding poor appetite and weight loss during cancer treatment if needed.  Document Released: 09/12/2007 Document Revised: 05/16/2012 Document Reviewed: 03/31/2012 [facility] Patient Information 2015 [place], [place]. This information is not intended to replace advice given to you by your health care provider. Make sure you discuss any questions you have with your health care provider. Tbo-Filgrastim injection What is this medicine? TBO-FILGRASTIM (T B O fil GRA stim) is a granulocyte colony-stimulating factor that stimulates the growth of neutrophils, a type of white blood cell important in the body's fight against infection. It is used to reduce the incidence of fever and infection in patients with certain types of cancer who are receiving chemotherapy that affects the bone marrow. This medicine may be used for other purposes; ask your health care provider or pharmacist if you have questions. COMMON BRAND NAME(S): Granix What should I tell my health care provider before I take this medicine? They need to know if you have any of these conditions: -ongoing radiation therapy -sickle cell anemia -an unusual or allergic reaction to tbo-filgrastim, filgrastim, pegfilgrastim, other medicines, foods, dyes, or preservatives -pregnant or trying to get pregnant -breast-feeding How should I use this medicine? This medicine is for  injection under the skin. If you get this medicine at home, you will be taught how to prepare and give this medicine. Refer to the Instructions for Use that come with your  medication packaging. Use exactly as directed. Take your medicine at regular intervals. Do not take your medicine more often than directed. It is important that you put your used needles and syringes in a special sharps container. Do not put them in a trash can. If you do not have a sharps container, call your pharmacist or healthcare provider to get one. Talk to your pediatrician regarding the use of this medicine in children. Special care may be needed. Overdosage: If you think you've taken too much of this medicine contact a poison control center or emergency room at once. Overdosage: If you think you have taken too much of this medicine contact a poison control center or emergency room at once. NOTE: This medicine is only for you. Do not share this medicine with others. What if I miss a dose? It is important not to miss your dose. Call your doctor or health care professional if you miss a dose. What may interact with this medicine? This medicine may interact with the following medications: -medicines that may cause a release of neutrophils, such as lithium This list may not describe all possible interactions. Give your health care provider a list of all the medicines, herbs, non-prescription drugs, or dietary supplements you use. Also tell them if you smoke, drink alcohol, or use illegal drugs. Some items may interact with your medicine. What should I watch for while using this medicine? You may need blood work done while you are taking this medicine. What side effects may I notice from receiving this medicine? Side effects that you should report to your doctor or health care professional as soon as possible: -allergic reactions like skin rash, itching or hives, swelling of the face, lips, or tongue -shortness of breath or breathing problems -fever -pain, redness, or irritation at site where injected -pinpoint red spots on the skin -stomach or side pain, or pain at the  shoulder -swelling -tiredness -trouble passing urine Side effects that usually do not require medical attention (Report these to your doctor or health care professional if they continue or are bothersome.): -bone pain -muscle pain This list may not describe all possible side effects. Call your doctor for medical advice about side effects. You may report side effects to FDA at 1-800-FDA-1088. Where should I keep my medicine? Keep out of the reach of children. Store in a refrigerator between 2 and 8 degrees C (36 and 46 degrees F). Keep in carton to protect from light. Throw away this medicine if it is left out of the refrigerator for more than 5 consecutive days. Throw away any unused medicine after the expiration date. NOTE: This sheet is a summary. It may not cover all possible information. If you have questions about this medicine, talk to your doctor, pharmacist, or health care provider.  2015, [employer]. (2014-03-07 11:52:29)

## 2015-05-11 ENCOUNTER — Other Ambulatory Visit: Payer: Self-pay | Admitting: Oncology

## 2015-05-11 DIAGNOSIS — C569 Malignant neoplasm of unspecified ovary: Secondary | ICD-10-CM

## 2015-05-12 ENCOUNTER — Other Ambulatory Visit (HOSPITAL_BASED_OUTPATIENT_CLINIC_OR_DEPARTMENT_OTHER): Payer: Medicare Other

## 2015-05-12 ENCOUNTER — Ambulatory Visit (HOSPITAL_BASED_OUTPATIENT_CLINIC_OR_DEPARTMENT_OTHER): Payer: Medicare Other | Admitting: Oncology

## 2015-05-12 ENCOUNTER — Encounter: Payer: Self-pay | Admitting: Oncology

## 2015-05-12 ENCOUNTER — Other Ambulatory Visit: Payer: Medicare Other

## 2015-05-12 ENCOUNTER — Telehealth: Payer: Self-pay | Admitting: Oncology

## 2015-05-12 VITALS — BP 135/82 | HR 65 | Temp 98.5°F | Resp 18 | Ht 63.0 in | Wt 131.3 lb

## 2015-05-12 DIAGNOSIS — C562 Malignant neoplasm of left ovary: Secondary | ICD-10-CM | POA: Diagnosis not present

## 2015-05-12 DIAGNOSIS — C7989 Secondary malignant neoplasm of other specified sites: Secondary | ICD-10-CM

## 2015-05-12 DIAGNOSIS — C561 Malignant neoplasm of right ovary: Secondary | ICD-10-CM

## 2015-05-12 DIAGNOSIS — D6959 Other secondary thrombocytopenia: Secondary | ICD-10-CM

## 2015-05-12 DIAGNOSIS — T451X5A Adverse effect of antineoplastic and immunosuppressive drugs, initial encounter: Secondary | ICD-10-CM

## 2015-05-12 DIAGNOSIS — D701 Agranulocytosis secondary to cancer chemotherapy: Secondary | ICD-10-CM | POA: Diagnosis not present

## 2015-05-12 DIAGNOSIS — C569 Malignant neoplasm of unspecified ovary: Secondary | ICD-10-CM

## 2015-05-12 LAB — CBC WITH DIFFERENTIAL/PLATELET
BASO%: 0.3 % (ref 0.0–2.0)
Basophils Absolute: 0 10*3/uL (ref 0.0–0.1)
EOS%: 0.3 % (ref 0.0–7.0)
Eosinophils Absolute: 0 10*3/uL (ref 0.0–0.5)
HEMATOCRIT: 32.3 % — AB (ref 34.8–46.6)
HGB: 10.8 g/dL — ABNORMAL LOW (ref 11.6–15.9)
LYMPH#: 1.6 10*3/uL (ref 0.9–3.3)
LYMPH%: 23.3 % (ref 14.0–49.7)
MCH: 33 pg (ref 25.1–34.0)
MCHC: 33.4 g/dL (ref 31.5–36.0)
MCV: 98.8 fL (ref 79.5–101.0)
MONO#: 0.9 10*3/uL (ref 0.1–0.9)
MONO%: 13.4 % (ref 0.0–14.0)
NEUT%: 62.7 % (ref 38.4–76.8)
NEUTROS ABS: 4.4 10*3/uL (ref 1.5–6.5)
Platelets: 76 10*3/uL — ABNORMAL LOW (ref 145–400)
RBC: 3.27 10*6/uL — AB (ref 3.70–5.45)
RDW: 13.1 % (ref 11.2–14.5)
WBC: 6.9 10*3/uL (ref 3.9–10.3)

## 2015-05-12 LAB — COMPREHENSIVE METABOLIC PANEL (CC13)
ALBUMIN: 3.4 g/dL — AB (ref 3.5–5.0)
ALT: 14 U/L (ref 0–55)
AST: 19 U/L (ref 5–34)
Alkaline Phosphatase: 86 U/L (ref 40–150)
Anion Gap: 8 mEq/L (ref 3–11)
BUN: 8.2 mg/dL (ref 7.0–26.0)
CO2: 28 meq/L (ref 22–29)
Calcium: 8.6 mg/dL (ref 8.4–10.4)
Chloride: 102 mEq/L (ref 98–109)
Creatinine: 0.7 mg/dL (ref 0.6–1.1)
EGFR: 88 mL/min/{1.73_m2} — AB (ref >90)
GLUCOSE: 100 mg/dL (ref 70–140)
Potassium: 4.1 mEq/L (ref 3.5–5.1)
SODIUM: 138 meq/L (ref 136–145)
TOTAL PROTEIN: 5.9 g/dL — AB (ref 6.4–8.3)
Total Bilirubin: 0.36 mg/dL (ref 0.20–1.20)

## 2015-05-12 LAB — CULTURE, BLOOD (SINGLE)

## 2015-05-12 NOTE — Telephone Encounter (Signed)
6/27 lab added per pof and patient will get a new schedule today

## 2015-05-12 NOTE — Progress Notes (Signed)
OFFICE PROGRESS NOTE   May 12, 2015   Physicians: D. ClarkePearson; J.Russo, T.Fontaine, Glenetta Hew (cardiology), [name], D.Jacobs, Zollie Beckers   All of interval information in EMR reviewed by MD, including ED evaluation.  INTERVAL HISTORY:  Patient is seen, together with husband, having had a number of concerns since chemotherapy begun 05-01-15 with q 3 week carboplatin taxol for recently documented recurrent ovarian carcinoma. She had IVF day 2, granix x 5 beginning day 5; she was neutropenic with ANC 0.3 on day 9.  She was seen at [facility] symptom management clinic on day 6 with transient temperature 102, and in ED on day 7 with pain which appeared to be granix related, no cardiac findings including EKG in ED.  She is feeling better overall today, and is no longer neutropenic. Urine culture was negative from 05-06-15.  Patient recalls that Dr Josephina Shih recommended repeat imaging after cycle 3, possibly to see him again then. I do not find this in his notes, but that follow up is very appropriate and certainly we can plan that.  She has follow up with Dr Virgina Jock this afternoon and with cardiology on 06-04-15.  Patient denies further fever. She had minimal nausea, prn zofran helpful. Appetite much better since yesterday, including pork chops and fresh vegetables. No peripheral neuropathy. Dysuria resolved. No problems with cipro, which she will stop now. Pain at ED evaluation was across anterior chest; by next day same discomfort was across low back, also consistent with granix. Claritin helpful for that discomfort when she began it after ED, will start this day of chemo next cycle. Slight esophagitis resolved. No bleeding or bruising, has continued ASA and plavix but will hold plavix now with platelets 76k. Some fatigue, no SOB at rest. No hair loss. PAC not tender from recent placement now.   Thyroid US by Dr Virgina Jock 05-08-15 not concerning, will have follow up in 12 months.  PAC placed by IR  04-21-15 No genetics counseling located in this EMR. Message to genetics counselors to confirm.  CA 125 on 05-01-15 33 She plans beach vacation with family June 18 - 25    ONCOLOGIC HISTORY Patient presented to Dr Abbie Sons in late Jan 2014 after bright red spotting that AM following 2 weeks of spotting in early Dec 2015. with uterus larger than had been apparent on exam in Oct 2013. Sonohystogram 01-05-13 showed uterus normal size and echotexture, endometrium 4.3 mm, left ovary normal and right adnexa with 1.1 x 8.4.x8.2 cm cystic and solid mass. Endometrial biopsy benign and CA 125 also on 01-05-13 was 178.8. She had CT AP 01-17-13 with 1.0 x 6.9 x 8.9 cm complex right ovarian mass, no ascites, small retroperitoneal nodes. She was seen by Dr Skeet Latch on 01-18-13 and taken to surgery by Dr Josephina Shih on 02-13-13, which was TAH/BSO/ omentectomy/ureterolysis/ resection of cul-de-sac tumor/right pelvic lymphadnectomy and resection of rectum with reanastomosis. At completion of surgery there was no gross residual disease. 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 Washings [phone]) had rare clusters of atypical cells. Chemotherapy with dose dense taxol carboplatin was begun day 1 cycle 1 on 03-20-13; ANC was 1.1 on day 15 cycle 1 with taxol given and neupogen added 04-04-13. She had day 1 cycle 2 treatment on 04-17-13, then was briefly hospitalized 5-22 to  04-20-13 after syncopal episode, with antihypertensive agents DCd and UTI treated. She was feeling much better at time of "day 8" cycle 2 on 05-01-13 and did have neupogen 300 mcg x 1 dose on 05-02-13. She was readmitted to hospital 6-5 thru 05-05-13 with fever, empirically on antibiotics and blood cultures  negative. She then went on planned beach vacation such that day 1 cycle 3 was administered on 05-22-13, with neupogen. Day 8 cycle 4 was delayed due to low ANC. Cycle 6 completed 08-14-13. CT AP 09-25-13 had no evidence of cancer and CA 125 was 18.3 on 07-24-2013. Marker was 20 in early Dec 2015, 26 on 01-31-15 and 35 on 03-14-15. PET 04-03-15 documented disease bilateral pelvic sidewalls, vaginal cuff and scattered areas thru abdomen/ pelvis, as well as area of uptake in spleen. She resumed carboplatin taxol using q 3 week regimen on 05-01-15. She was neutropenic with ANC 0.3 on day 9 despite beginning granix on day 5.    Review of systems as above, also: No other fever. No LE swelling. Did sleep last PM. Taste ok. Remainder of 10 point Review of Systems negative.  Objective:  Vital signs in last 24 hours:  BP 135/82 mmHg  Pulse 65  Temp(Src) 98.5 F (36.9 C) (Oral)  Resp 18  Ht 5' 3"  (1.6 m)  Wt 131 lb 4.8 oz (59.557 kg)  BMI 23.26 kg/m2  SpO2 100% Weight stable Alert, oriented and appropriate. Ambulatory without difficulty, careful getting on and off exam table (knee surgery Feb). No alopecia  HEENT:PERRL, sclerae not icteric. Oral mucosa moist without lesions, posterior pharynx clear.  Neck supple. No JVD.  Lymphatics:no cervical,supraclavicular, axillary or inguinal adenopathy Resp: clear to auscultation bilaterally and normal percussion bilaterally Cardio: regular rate and rhythm. No gallop. GI: soft, nontender, not distended, no mass or organomegaly. Normally active bowel sounds. Surgical incision not remarkable. Musculoskeletal/ Extremities: without pitting edema, cords, tenderness Neuro: no peripheral neuropathy. Otherwise nonfocal. PSYCH appropriate mood and affect Skin without rash, ecchymosis, petechiae Portacath-without erythema or tenderness. Ecchymosis resolved  Lab Results:  Results for orders placed or performed in visit on 05/12/15  CBC with Differential  Result Value  Ref Range   WBC 6.9 3.9 - 10.3 10e3/uL   NEUT# 4.4 1.5 - 6.5 10e3/uL   HGB 10.8 (L) 11.6 - 15.9 g/dL   HCT 32.3 (L) 34.8 - 46.6 %   Platelets 76 (L) 145 - 400 10e3/uL   MCV 98.8 79.5 - 101.0 fL   MCH 33.0 25.1 - 34.0 pg   MCHC 33.4 31.5 - 36.0 g/dL   RBC 3.27 (L) 3.70 - 5.45 10e6/uL   RDW 13.1 11.2 - 14.5 %   lymph# 1.6 0.9 - 3.3 10e3/uL   MONO# 0.9 0.1 - 0.9 10e3/uL   Eosinophils Absolute 0.0 0.0 - 0.5 10e3/uL   Basophils Absolute 0.0 0.0 - 0.1 10e3/uL   NEUT% 62.7 38.4 - 76.8 %   LYMPH% 23.3 14.0 - 49.7 %   MONO% 13.4 0.0 - 14.0 %   EOS% 0.3 0.0 - 7.0 %   BASO% 0.3 0.0 - 2.0 %  Comprehensive metabolic panel (Cmet) - [facility]  Result Value Ref Range   Sodium 138 136 - 145 mEq/L   Potassium 4.1 3.5 - 5.1 mEq/L   Chloride 102 98 - 109 mEq/L   CO2 28 22 - 29 mEq/L   Glucose 100 70 - 140 mg/dl   BUN 8.2 7.0 - 26.0 mg/dL   Creatinine 0.7 0.6 - 1.1 mg/dL  Total Bilirubin 0.36 0.20 - 1.20 mg/dL   Alkaline Phosphatase 86 40 - 150 U/L   AST 19 5 - 34 U/L   ALT 14 0 - 55 U/L   Total Protein 5.9 (L) 6.4 - 8.3 g/dL   Albumin 3.4 (L) 3.5 - 5.0 g/dL   Calcium 8.6 8.4 - 10.4 mg/dL   Anion Gap 8 3 - 11 mEq/L   EGFR 88 (L) >90 ml/min/1.73 m2    Urine culture 05-06-15 no growth  Troponin in ED 05-08-15  0.00   Studies/Results: PORTABLE CHEST - 1 VIEW  05-08-15 COMPARISON: 12/30/2014  FINDINGS: Right jugular Port-A-Cath shows appropriate positioning with the catheter tip in the distal SVC. There is no evidence of pulmonary edema, consolidation, pneumothorax, nodule or pleural fluid. The heart size and mediastinal contours are stable and within normal limits. No bony abnormalities are seen.  IMPRESSION: No active disease.   Thyroid US 05-08-15 noted.   Medications: I have reviewed the patient's current medications. She will hold plavix at least until repeat CBC on 05-16-15.  Stop cipro now as not neutropenic Claritin begin day of chemo and continue thru a couple of days after  granix.  DISCUSSION: patient is understandably upset and anxious today given difficult time with this first cycle of chemotherapy, tho we are pleased that she is no longer neutropenic. She still would like to be on q 3 week regimen. We have discussed using neulasta at [facility] or by [facility] home injector instead of multiple days of granix; they understand neulasta needs preauthorization and may cause increased aching. She prefers to continue granix for next cycle.  We have discussed importance of trying to control the recurrent cancer with chemotherapy.   Will repeat CBC on 05-16-15 prior to her beach trip. Doubt ANC will drop again this cycle and hopefully platelets will improve soon. She knows to call if bleeding or unusual bruising prior to next labs.   She understands that we will add avastin to cycle 2 if preauthorization possible; consent obtained.  She prefers to see me with treatment on 6-27 rather than MD only that day and chemo later that week (after beach). She has had avastin teaching by RN at last visit.  Copies of all recent labs and my last note given to patient, to take to Dr Keane Police appointment later today.  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 Carboplatin and taxol on q 3 week regimen begun 05-01-15,  perfers Mondays, skin test prior to each [place]. Needed granix x 5 with cycle 1, hopefully begin prior to day 6 with next cycle. Recheck counts on 05-16-15.  I will see her prior to cycle 2 carbo taxol/ cycle 1 avastin on 05-26-15. Carbo dose reduced cycle 2 due to cytopenias cycle 1 and prior treatment.  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 Hold plavix now, ASA effects last a week so will not try to stop that now.  5.previous constipation requiring increased laxatives with  adjuvant chemo, up to date on colonoscopy 6.hypothyroid on replacement 7.right hip replacement 2013 also for osteoarthritis 8. Long estrogen replacement, which she still uses. She has previously refused to discontinue the estrogen. Initial surgical path from 02-13-13 does not report ER PR testing. 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 Breast tissue heterogeneously dense by last tomo mammograms at [facility] 03-21-15, those mammograms otherwise negative.  10. Message sent to genetics counselors ? If testing done, as I do not locate this in EMR following visit. 11. Thyroid uptake on PET without clear concern on [place] just done.    All questions answered. They know to call if any concerns prior to next scheduled visit. Time spent 40 min including >50% counseling and coordination of care. Chemo, avastin, IVF and granix orders placed. Message to RN for CBC on 6-17. Cc Dr Esaw Dace, MD   05/12/2015, 3:12 PM

## 2015-05-13 ENCOUNTER — Telehealth: Payer: Self-pay | Admitting: Oncology

## 2015-05-13 DIAGNOSIS — D6959 Other secondary thrombocytopenia: Secondary | ICD-10-CM | POA: Insufficient documentation

## 2015-05-13 DIAGNOSIS — D701 Agranulocytosis secondary to cancer chemotherapy: Secondary | ICD-10-CM | POA: Insufficient documentation

## 2015-05-13 DIAGNOSIS — C7989 Secondary malignant neoplasm of other specified sites: Secondary | ICD-10-CM | POA: Insufficient documentation

## 2015-05-13 DIAGNOSIS — T451X5A Adverse effect of antineoplastic and immunosuppressive drugs, initial encounter: Secondary | ICD-10-CM | POA: Insufficient documentation

## 2015-05-13 NOTE — Telephone Encounter (Signed)
appointments made and patient will get a new schedule at 6/17 lab appointment

## 2015-05-16 ENCOUNTER — Other Ambulatory Visit (HOSPITAL_BASED_OUTPATIENT_CLINIC_OR_DEPARTMENT_OTHER): Payer: Medicare Other

## 2015-05-16 ENCOUNTER — Telehealth: Payer: Self-pay

## 2015-05-16 DIAGNOSIS — C7989 Secondary malignant neoplasm of other specified sites: Secondary | ICD-10-CM

## 2015-05-16 DIAGNOSIS — C562 Malignant neoplasm of left ovary: Secondary | ICD-10-CM

## 2015-05-16 DIAGNOSIS — C561 Malignant neoplasm of right ovary: Secondary | ICD-10-CM | POA: Diagnosis not present

## 2015-05-16 DIAGNOSIS — C569 Malignant neoplasm of unspecified ovary: Secondary | ICD-10-CM

## 2015-05-16 LAB — CBC WITH DIFFERENTIAL/PLATELET
BASO%: 1.1 % (ref 0.0–2.0)
BASOS ABS: 0.1 10*3/uL (ref 0.0–0.1)
EOS%: 0.2 % (ref 0.0–7.0)
Eosinophils Absolute: 0 10*3/uL (ref 0.0–0.5)
HCT: 33.9 % — ABNORMAL LOW (ref 34.8–46.6)
HGB: 11.5 g/dL — ABNORMAL LOW (ref 11.6–15.9)
LYMPH%: 30 % (ref 14.0–49.7)
MCH: 32.9 pg (ref 25.1–34.0)
MCHC: 33.8 g/dL (ref 31.5–36.0)
MCV: 97.5 fL (ref 79.5–101.0)
MONO#: 0.6 10*3/uL (ref 0.1–0.9)
MONO%: 12.5 % (ref 0.0–14.0)
NEUT#: 2.7 10*3/uL (ref 1.5–6.5)
NEUT%: 56.2 % (ref 38.4–76.8)
Platelets: 147 10*3/uL (ref 145–400)
RBC: 3.48 10*6/uL — ABNORMAL LOW (ref 3.70–5.45)
RDW: 13.1 % (ref 11.2–14.5)
WBC: 4.8 10*3/uL (ref 3.9–10.3)
lymph#: 1.4 10*3/uL (ref 0.9–3.3)

## 2015-05-16 LAB — UA PROTEIN, DIPSTICK - CHCC: Protein, ur: <30 mg/dL

## 2015-05-16 NOTE — Telephone Encounter (Signed)
[name] ANC today was 2.7 and plt count was 147K. Told her that she did not need an injection today.  Since her plts were up to 147K, she could resume her plavix.  Ms. Lanphear verbalized understanding.

## 2015-05-16 NOTE — Telephone Encounter (Signed)
-----   Message from Gordy Levan, MD sent at 05/13/2015  2:28 PM EDT ----- For CBC at 1030 on 6-17, before she goes to beach. I told her to wait for RN to speak with her that day.  If she is neutropenic will need granix 300 - please be sure I know If plt <100 would continue to hold plavix - ask me how long  thanks

## 2015-05-25 ENCOUNTER — Other Ambulatory Visit: Payer: Self-pay | Admitting: Oncology

## 2015-05-25 DIAGNOSIS — C569 Malignant neoplasm of unspecified ovary: Secondary | ICD-10-CM

## 2015-05-25 DIAGNOSIS — C561 Malignant neoplasm of right ovary: Secondary | ICD-10-CM

## 2015-05-25 DIAGNOSIS — C562 Malignant neoplasm of left ovary: Secondary | ICD-10-CM

## 2015-05-25 DIAGNOSIS — C563 Malignant neoplasm of bilateral ovaries: Secondary | ICD-10-CM

## 2015-05-25 DIAGNOSIS — C7989 Secondary malignant neoplasm of other specified sites: Secondary | ICD-10-CM

## 2015-05-26 ENCOUNTER — Other Ambulatory Visit (HOSPITAL_BASED_OUTPATIENT_CLINIC_OR_DEPARTMENT_OTHER): Payer: Medicare Other

## 2015-05-26 ENCOUNTER — Other Ambulatory Visit: Payer: Self-pay | Admitting: *Deleted

## 2015-05-26 ENCOUNTER — Telehealth: Payer: Self-pay | Admitting: Oncology

## 2015-05-26 ENCOUNTER — Ambulatory Visit (HOSPITAL_BASED_OUTPATIENT_CLINIC_OR_DEPARTMENT_OTHER): Payer: Medicare Other

## 2015-05-26 ENCOUNTER — Ambulatory Visit: Payer: Medicare Other | Admitting: Oncology

## 2015-05-26 ENCOUNTER — Ambulatory Visit (HOSPITAL_BASED_OUTPATIENT_CLINIC_OR_DEPARTMENT_OTHER): Payer: Medicare Other | Admitting: Oncology

## 2015-05-26 ENCOUNTER — Encounter: Payer: Self-pay | Admitting: Oncology

## 2015-05-26 ENCOUNTER — Other Ambulatory Visit: Payer: Medicare Other

## 2015-05-26 VITALS — BP 139/80 | HR 68 | Temp 97.8°F | Resp 18 | Ht 63.0 in | Wt 132.0 lb

## 2015-05-26 DIAGNOSIS — C569 Malignant neoplasm of unspecified ovary: Secondary | ICD-10-CM

## 2015-05-26 DIAGNOSIS — C561 Malignant neoplasm of right ovary: Secondary | ICD-10-CM

## 2015-05-26 DIAGNOSIS — D701 Agranulocytosis secondary to cancer chemotherapy: Secondary | ICD-10-CM

## 2015-05-26 DIAGNOSIS — Z9889 Other specified postprocedural states: Secondary | ICD-10-CM

## 2015-05-26 DIAGNOSIS — C7989 Secondary malignant neoplasm of other specified sites: Secondary | ICD-10-CM | POA: Diagnosis not present

## 2015-05-26 DIAGNOSIS — T451X5A Adverse effect of antineoplastic and immunosuppressive drugs, initial encounter: Secondary | ICD-10-CM

## 2015-05-26 DIAGNOSIS — C562 Malignant neoplasm of left ovary: Secondary | ICD-10-CM

## 2015-05-26 DIAGNOSIS — Z95828 Presence of other vascular implants and grafts: Secondary | ICD-10-CM

## 2015-05-26 DIAGNOSIS — Z5112 Encounter for antineoplastic immunotherapy: Secondary | ICD-10-CM

## 2015-05-26 LAB — CBC WITH DIFFERENTIAL/PLATELET
BASO%: 1.2 % (ref 0.0–2.0)
BASOS ABS: 0 10*3/uL (ref 0.0–0.1)
EOS%: 0.4 % (ref 0.0–7.0)
Eosinophils Absolute: 0 10*3/uL (ref 0.0–0.5)
HEMATOCRIT: 35.4 % (ref 34.8–46.6)
HGB: 12 g/dL (ref 11.6–15.9)
LYMPH#: 0.4 10*3/uL — AB (ref 0.9–3.3)
LYMPH%: 16.3 % (ref 14.0–49.7)
MCH: 33.6 pg (ref 25.1–34.0)
MCHC: 33.9 g/dL (ref 31.5–36.0)
MCV: 99 fL (ref 79.5–101.0)
MONO#: 0 10*3/uL — ABNORMAL LOW (ref 0.1–0.9)
MONO%: 0.6 % (ref 0.0–14.0)
NEUT#: 1.9 10*3/uL (ref 1.5–6.5)
NEUT%: 81.5 % — ABNORMAL HIGH (ref 38.4–76.8)
Platelets: 155 10*3/uL (ref 145–400)
RBC: 3.57 10*6/uL — ABNORMAL LOW (ref 3.70–5.45)
RDW: 14.1 % (ref 11.2–14.5)
WBC: 2.3 10*3/uL — ABNORMAL LOW (ref 3.9–10.3)

## 2015-05-26 LAB — COMPREHENSIVE METABOLIC PANEL (CC13)
ALBUMIN: 3.9 g/dL (ref 3.5–5.0)
ALT: 13 U/L (ref 0–55)
AST: 15 U/L (ref 5–34)
Alkaline Phosphatase: 80 U/L (ref 40–150)
Anion Gap: 9 mEq/L (ref 3–11)
BUN: 11.5 mg/dL (ref 7.0–26.0)
CHLORIDE: 103 meq/L (ref 98–109)
CO2: 27 meq/L (ref 22–29)
Calcium: 9.2 mg/dL (ref 8.4–10.4)
Creatinine: 0.7 mg/dL (ref 0.6–1.1)
EGFR: 82 mL/min/{1.73_m2} — AB (ref >90)
GLUCOSE: 192 mg/dL — AB (ref 70–140)
Potassium: 4 mEq/L (ref 3.5–5.1)
Sodium: 139 mEq/L (ref 136–145)
Total Bilirubin: 0.54 mg/dL (ref 0.20–1.20)
Total Protein: 6.4 g/dL (ref 6.4–8.3)

## 2015-05-26 MED ORDER — HEPARIN SOD (PORK) LOCK FLUSH 100 UNIT/ML IV SOLN
500.0000 [IU] | Freq: Once | INTRAVENOUS | Status: AC | PRN
  Administered 2015-05-26: 500 [IU]
  Filled 2015-05-26: qty 5

## 2015-05-26 MED ORDER — CARBOPLATIN CHEMO INTRADERMAL TEST DOSE 100MCG/0.02ML
100.0000 ug | Freq: Once | INTRADERMAL | Status: AC
  Administered 2015-05-26: 100 ug via INTRADERMAL
  Filled 2015-05-26: qty 0.01

## 2015-05-26 MED ORDER — CARBOPLATIN CHEMO INJECTION 450 MG/45ML
295.6000 mg | Freq: Once | INTRAVENOUS | Status: AC
  Administered 2015-05-26: 300 mg via INTRAVENOUS
  Filled 2015-05-26: qty 30

## 2015-05-26 MED ORDER — FAMOTIDINE IN NACL 20-0.9 MG/50ML-% IV SOLN
20.0000 mg | Freq: Once | INTRAVENOUS | Status: AC
  Administered 2015-05-26: 20 mg via INTRAVENOUS

## 2015-05-26 MED ORDER — PACLITAXEL CHEMO INJECTION 300 MG/50ML
135.0000 mg/m2 | Freq: Once | INTRAVENOUS | Status: AC
  Administered 2015-05-26: 222 mg via INTRAVENOUS
  Filled 2015-05-26: qty 37

## 2015-05-26 MED ORDER — SODIUM CHLORIDE 0.9 % IV SOLN
Freq: Once | INTRAVENOUS | Status: AC
  Administered 2015-05-26: 12:00:00 via INTRAVENOUS
  Filled 2015-05-26: qty 8

## 2015-05-26 MED ORDER — SODIUM CHLORIDE 0.9 % IV SOLN
15.0000 mg/kg | Freq: Once | INTRAVENOUS | Status: AC
  Administered 2015-05-26: 900 mg via INTRAVENOUS
  Filled 2015-05-26: qty 36

## 2015-05-26 MED ORDER — DIPHENHYDRAMINE HCL 50 MG/ML IJ SOLN
50.0000 mg | Freq: Once | INTRAMUSCULAR | Status: AC
  Administered 2015-05-26: 50 mg via INTRAVENOUS

## 2015-05-26 MED ORDER — SODIUM CHLORIDE 0.9 % IV SOLN
Freq: Once | INTRAVENOUS | Status: AC
  Administered 2015-05-26: 10:00:00 via INTRAVENOUS

## 2015-05-26 MED ORDER — DIPHENHYDRAMINE HCL 50 MG/ML IJ SOLN
INTRAMUSCULAR | Status: AC
  Filled 2015-05-26: qty 1

## 2015-05-26 MED ORDER — SODIUM CHLORIDE 0.9 % IJ SOLN
10.0000 mL | INTRAMUSCULAR | Status: DC | PRN
  Administered 2015-05-26: 10 mL
  Filled 2015-05-26: qty 10

## 2015-05-26 MED ORDER — DEXAMETHASONE 4 MG PO TABS: ORAL_TABLET | ORAL | Status: DC

## 2015-05-26 MED ORDER — FAMOTIDINE IN NACL 20-0.9 MG/50ML-% IV SOLN
INTRAVENOUS | Status: AC
  Filled 2015-05-26: qty 50

## 2015-05-26 NOTE — Progress Notes (Signed)
OFFICE PROGRESS NOTE   May 26, 2015   Physicians:D. 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  She enjoyed family vacation at beach last week.  Patient is feeling well today, as she has this past week. Appetite has been good and energy good other than a little tired in afternoons. Bowels are moving regularly. No pain. No bleeding. No problems with PAC.     PAC placed by IR 04-21-15 No genetics counseling located in this EMR. Message to genetics counselors to confirm.  CA 125 on 05-01-15 33   ONCOLOGIC HISTORY Patient presented to Dr Abbie Sons in late Jan 2014 after bright red spotting that AM following 2 weeks of spotting in early Dec 2015. with uterus larger than had been apparent on exam in Oct 2013. Sonohystogram 01-05-13 showed uterus normal size and echotexture, endometrium 4.3 mm, left ovary normal and right adnexa with 1.1 x 8.4.x8.2 cm cystic and solid mass. Endometrial biopsy benign and CA 125 also on 01-05-13 was 178.8. She had CT AP 01-17-13 with 1.0 x 6.9 x 8.9 cm complex right ovarian mass, no ascites, small retroperitoneal nodes. She was seen by Dr Skeet Latch on 01-18-13 and taken to surgery by Dr Josephina Shih on 02-13-13, which was TAH/BSO/ omentectomy/ureterolysis/ resection of cul-de-sac tumor/right pelvic lymphadnectomy and resection of rectum with reanastomosis. At completion of surgery there was no gross residual disease. 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 Washings [phone]) had  rare clusters of atypical cells. Chemotherapy with dose dense taxol carboplatin was begun day 1 cycle 1 on 03-20-13; ANC was 1.1 on day 15 cycle 1 with taxol given and neupogen added 04-04-13. She had day 1 cycle 2 treatment on 04-17-13, then was briefly hospitalized 5-22 to 04-20-13 after syncopal episode, with antihypertensive agents DCd and UTI treated. She was feeling much better at time of "day 8" cycle 2 on 05-01-13 and did have neupogen 300 mcg x 1 dose on 05-02-13. She was readmitted to hospital 6-5 thru 05-05-13 with fever, empirically on antibiotics and blood cultures negative. She then went on planned beach vacation such that day 1 cycle 3 was administered on 05-22-13, with neupogen. Day 8 cycle 4 was delayed due to low ANC. Cycle 6 completed 08-14-13. CT AP 09-25-13 had no evidence of cancer and CA 125 was 18.3 on 07-24-2013. Marker was 20 in early Dec 2015, 26 on 01-31-15 and 35 on 03-14-15. PET 04-03-15 documented disease bilateral pelvic sidewalls, vaginal cuff and scattered areas thru abdomen/ pelvis, as well as area of uptake in spleen. She resumed carboplatin taxol using q 3 week regimen on 05-01-15. She was neutropenic with ANC 0.3 on day 9 despite beginning granix on day 5.    Review of systems as above, also: No fever or symptoms of infection. No significant peripheral neuropathy. Hair has come out. No LE swelling. Bladder ok Remainder of 10 point Review of Systems negative.  Objective:  Vital signs in last 24 hours:  BP 139/80 mmHg  Pulse 68  Temp(Src) 97.8 F (36.6 C) (Oral)  Resp 18  Ht 5' 3" (1.6 m)  Wt 132 lb (59.875 kg)  BMI 23.39 kg/m2  SpO2 100% Weight up 1 lb. Alert, oriented and appropriate. Ambulatory without difficulty.  Partial alopecia  HEENT:PERRL, sclerae not icteric. Oral mucosa moist without lesions, posterior pharynx clear.  Neck supple. No JVD.  Lymphatics:no cervical,supraclavicular, axillary or inguinal adenopathy Resp: clear to auscultation bilaterally and normal  percussion bilaterally Cardio: regular rate and rhythm. No gallop. GI: soft, nontender, not distended, no mass or organomegaly. Normally active bowel sounds. Surgical incision not remarkable. Musculoskeletal/ Extremities: without pitting edema, cords, tenderness Neuro: no peripheral neuropathy. Otherwise nonfocal. PSYCH appropriate mood and affect Skin without rash, ecchymosis, petechiae Portacath-without erythema or tenderness, incision healed, EMLA in place.   Lab Results:  Results for orders placed or performed in visit on 05/26/15  CBC with Differential  Result Value Ref Range   WBC 2.3 (L) 3.9 - 10.3 10e3/uL   NEUT# 1.9 1.5 - 6.5 10e3/uL   HGB 12.0 11.6 - 15.9 g/dL   HCT 35.4 34.8 - 46.6 %   Platelets 155 145 - 400 10e3/uL   MCV 99.0 79.5 - 101.0 fL   MCH 33.6 25.1 - 34.0 pg   MCHC 33.9 31.5 - 36.0 g/dL   RBC 3.57 (L) 3.70 - 5.45 10e6/uL   RDW 14.1 11.2 - 14.5 %   lymph# 0.4 (L) 0.9 - 3.3 10e3/uL   MONO# 0.0 (L) 0.1 - 0.9 10e3/uL   Eosinophils Absolute 0.0 0.0 - 0.5 10e3/uL   Basophils Absolute 0.0 0.0 - 0.1 10e3/uL   NEUT% 81.5 (H) 38.4 - 76.8 %   LYMPH% 16.3 14.0 - 49.7 %   MONO% 0.6 0.0 - 14.0 %   EOS% 0.4 0.0 - 7.0 %   BASO% 1.2 0.0 - 2.0 %  Comprehensive metabolic panel (Cmet) - [facility]  Result Value Ref Range   Sodium 139 136 - 145 mEq/L   Potassium 4.0 3.5 - 5.1 mEq/L   Chloride 103 98 - 109 mEq/L   CO2 27 22 - 29 mEq/L   Glucose 192 (H) 70 - 140 mg/dl   BUN 11.5 7.0 - 26.0 mg/dL   Creatinine 0.7 0.6 - 1.1 mg/dL   Total Bilirubin 0.54 0.20 - 1.20 mg/dL   Alkaline Phosphatase 80 40 - 150 U/L   AST 15 5 - 34 U/L   ALT 13 0 - 55 U/L   Total Protein 6.4 6.4 - 8.3 g/dL   Albumin 3.9 3.5 - 5.0 g/dL   Calcium 9.2 8.4 - 10.4 mg/dL   Anion Gap 9 3 - 11 mEq/L   EGFR 82 (L) >90 ml/min/1.73 m2    CA 125 ordered but not drawn today, will add to next labs.  Urine protein 05-16-15     <30  Studies/Results:  No results found.  Medications: I have reviewed the  patient's current medications.  She will start claritin today and use thru granix injections for taxol/ granix aches.   DISCUSSION: patient and husband are comfortable with proceeding with chemotherapy + avastin today. She will have IVF and begin granix on 6-28, total 5 days of granix.  She prefers all labs from [facility], which we will continue to try to get scheduled correctly.  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 Resumed chemotherapy with carboplatin and taxol using q 3 week dosing,  begun 05-01-15. Addition of avastin starting cycle 2. IVF day 2 and granix x 5 begin day 2.   2.PAC placement by IR 04-21-15 : delayed start of avastin until cycle 2 3.left knee replacement 12-2014, doing well 4.HTN, elevated lipids, paroxysmal Afib: followed by Dr Ellyn Hack of cardiology. Note on plavix and ASA if platelets drop with chemo. 5.previous constipation requiring increased laxatives with adjuvant chemo, up to date on colonoscopy 6.hypothyroid on replacement 7.right hip replacement 2013 also for osteoarthritis 8. Long estrogen replacement, which she still uses. She has previously refused to discontinue the estrogen. Initial surgical path from 02-13-13 does not report ER PR testing. 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 Breast tissue heterogeneously dense by last tomo mammograms at [facility] 03-21-15, those mammograms otherwise negative.  10. Message sent to genetics counselors ? If testing done, as I do not locate this in EMR following visit.   All questions answered. Patient knows to call if concerns prior to next scheduled visit. Chemo, IVF and granix orders confirmed. Time spent 25 min including  >50% counseling and coordination of care.    , P, MD   05/26/2015, 9:41 AM

## 2015-05-26 NOTE — Patient Instructions (Signed)
[facility] Discharge Instructions for Patients Receiving Chemotherapy  Today you received the following chemotherapy agents Avastin/Taxol/Carboplatin.  To help prevent nausea and vomiting after your treatment, we encourage you to take your nausea medication as prescribed   If you develop nausea and vomiting that is not controlled by your nausea medication, call the clinic.   BELOW ARE SYMPTOMS THAT SHOULD BE REPORTED IMMEDIATELY:  *FEVER GREATER THAN 100.5 F  *CHILLS WITH OR WITHOUT FEVER  NAUSEA AND VOMITING THAT IS NOT CONTROLLED WITH YOUR NAUSEA MEDICATION  *UNUSUAL SHORTNESS OF BREATH  *UNUSUAL BRUISING OR BLEEDING  TENDERNESS IN MOUTH AND THROAT WITH OR WITHOUT PRESENCE OF ULCERS  *URINARY PROBLEMS  *BOWEL PROBLEMS  UNUSUAL RASH Items with * indicate a potential emergency and should be followed up as soon as possible.  Feel free to call the clinic you have any questions or concerns. The clinic phone number is [phone].  Please show the [place] at check-in to the Emergency Department and triage nurse.

## 2015-05-26 NOTE — Telephone Encounter (Signed)
Gave avs & calendar for July °

## 2015-05-27 ENCOUNTER — Telehealth: Payer: Self-pay

## 2015-05-27 ENCOUNTER — Ambulatory Visit: Payer: Medicare Other

## 2015-05-27 ENCOUNTER — Ambulatory Visit (HOSPITAL_BASED_OUTPATIENT_CLINIC_OR_DEPARTMENT_OTHER): Payer: Medicare Other

## 2015-05-27 VITALS — BP 141/68 | HR 57 | Temp 97.3°F | Resp 16

## 2015-05-27 DIAGNOSIS — C562 Malignant neoplasm of left ovary: Secondary | ICD-10-CM

## 2015-05-27 DIAGNOSIS — C561 Malignant neoplasm of right ovary: Secondary | ICD-10-CM

## 2015-05-27 DIAGNOSIS — C569 Malignant neoplasm of unspecified ovary: Secondary | ICD-10-CM

## 2015-05-27 DIAGNOSIS — Z5189 Encounter for other specified aftercare: Secondary | ICD-10-CM

## 2015-05-27 DIAGNOSIS — C7989 Secondary malignant neoplasm of other specified sites: Secondary | ICD-10-CM

## 2015-05-27 DIAGNOSIS — R971 Elevated cancer antigen 125 [CA 125]: Secondary | ICD-10-CM

## 2015-05-27 MED ORDER — TBO-FILGRASTIM 300 MCG/0.5ML ~~LOC~~ SOSY
300.0000 ug | PREFILLED_SYRINGE | Freq: Once | SUBCUTANEOUS | Status: AC
  Administered 2015-05-27: 300 ug via SUBCUTANEOUS
  Filled 2015-05-27: qty 0.5

## 2015-05-27 MED ORDER — SODIUM CHLORIDE 0.9 % IJ SOLN
10.0000 mL | INTRAMUSCULAR | Status: DC | PRN
  Administered 2015-05-27: 10 mL via INTRAVENOUS
  Filled 2015-05-27: qty 10

## 2015-05-27 MED ORDER — HEPARIN SOD (PORK) LOCK FLUSH 100 UNIT/ML IV SOLN
500.0000 [IU] | Freq: Once | INTRAVENOUS | Status: AC
  Administered 2015-05-27: 500 [IU] via INTRAVENOUS
  Filled 2015-05-27: qty 5

## 2015-05-27 MED ORDER — SODIUM CHLORIDE 0.9 % IV SOLN
INTRAVENOUS | Status: DC
  Administered 2015-05-27: 11:00:00 via INTRAVENOUS

## 2015-05-27 NOTE — Telephone Encounter (Signed)
Spoke with [name] in the infusion room as she was receiving prophylactic IVF today.  She stated that she is doing well after her Taxol, carboplatin yesterday as well as new chemotherapy Avastin.  She is fatigued.  She knows to call the [facility] at [phone] there are any questions or concerns.

## 2015-05-27 NOTE — Telephone Encounter (Signed)
-----   Message from Renford Dills, RN sent at 05/26/2015 12:40 PM EDT ----- Regarding: chemo f/u Dr. Marko Plume 1st Avastin

## 2015-05-27 NOTE — Telephone Encounter (Signed)
PT. CALLED TO UPDATE DR.LIVESAY'S NURSE, LOUISE ARCHAMBAULT,RN. PT.'S FACE AND NECK ARE RED AND WARM. THERE IS NO RASH AND PT. DOES NOT HAVE A FEVER. SHE IS "EXTRA TIRED".

## 2015-05-27 NOTE — Patient Instructions (Signed)
Dehydration, Adult Dehydration is when you lose more fluids from the body than you take in. Vital organs like the kidneys, brain, and heart cannot function without a proper amount of fluids and salt. Any loss of fluids from the body can cause dehydration.  CAUSES   Vomiting.  Diarrhea.  Excessive sweating.  Excessive urine output.  Fever. SYMPTOMS  Mild dehydration  Thirst.  Dry lips.  Slightly dry mouth. Moderate dehydration  Very dry mouth.  Sunken eyes.  Skin does not bounce back quickly when lightly pinched and released.  Dark urine and decreased urine production.  Decreased tear production.  Headache. Severe dehydration  Very dry mouth.  Extreme thirst.  Rapid, weak pulse (more than 100 beats per minute at rest).  Cold hands and feet.  Not able to sweat in spite of heat and temperature.  Rapid breathing.  Blue lips.  Confusion and lethargy.  Difficulty being awakened.  Minimal urine production.  No tears. DIAGNOSIS  Your caregiver will diagnose dehydration based on your symptoms and your exam. Blood and urine tests will help confirm the diagnosis. The diagnostic evaluation should also identify the cause of dehydration. TREATMENT  Treatment of mild or moderate dehydration can often be done at home by increasing the amount of fluids that you drink. It is best to drink small amounts of fluid more often. Drinking too much at one time can make vomiting worse. Refer to the home care instructions below. Severe dehydration needs to be treated at the hospital where you will probably be given intravenous (IV) fluids that contain water and electrolytes. HOME CARE INSTRUCTIONS   Ask your caregiver about specific rehydration instructions.  Drink enough fluids to keep your urine clear or pale yellow.  Drink small amounts frequently if you have nausea and vomiting.  Eat as you normally do.  Avoid:  Foods or drinks high in sugar.  Carbonated  drinks.  Juice.  Extremely hot or cold fluids.  Drinks with caffeine.  Fatty, greasy foods.  Alcohol.  Tobacco.  Overeating.  Gelatin desserts.  Wash your hands well to avoid spreading bacteria and viruses.  Only take over-the-counter or prescription medicines for pain, discomfort, or fever as directed by your caregiver.  Ask your caregiver if you should continue all prescribed and over-the-counter medicines.  Keep all follow-up appointments with your caregiver. SEEK MEDICAL CARE IF:  You have abdominal pain and it increases or stays in one area (localizes).  You have a rash, stiff neck, or severe headache.  You are irritable, sleepy, or difficult to awaken.  You are weak, dizzy, or extremely thirsty. SEEK IMMEDIATE MEDICAL CARE IF:   You are unable to keep fluids down or you get worse despite treatment.  You have frequent episodes of vomiting or diarrhea.  You have blood or green matter (bile) in your vomit.  You have blood in your stool or your stool looks black and tarry.  You have not urinated in 6 to 8 hours, or you have only urinated a small amount of very dark urine.  You have a fever.  You faint. MAKE SURE YOU:   Understand these instructions.  Will watch your condition.  Will get help right away if you are not doing well or get worse. Document Released: 11/15/2005 Document Revised: 02/07/2012 Document Reviewed: 07/05/2011 [employer] Patient Information 2015 [employer]. This information is not intended to replace advice given to you by your health care provider. Make sure you discuss any questions you have with your health care   provider.  

## 2015-05-28 ENCOUNTER — Ambulatory Visit: Payer: Medicare Other

## 2015-05-28 ENCOUNTER — Other Ambulatory Visit: Payer: Self-pay | Admitting: *Deleted

## 2015-05-28 ENCOUNTER — Ambulatory Visit (HOSPITAL_BASED_OUTPATIENT_CLINIC_OR_DEPARTMENT_OTHER): Payer: Medicare Other | Admitting: Nurse Practitioner

## 2015-05-28 ENCOUNTER — Ambulatory Visit (HOSPITAL_BASED_OUTPATIENT_CLINIC_OR_DEPARTMENT_OTHER): Payer: Medicare Other | Admitting: *Deleted

## 2015-05-28 ENCOUNTER — Ambulatory Visit (HOSPITAL_BASED_OUTPATIENT_CLINIC_OR_DEPARTMENT_OTHER): Payer: Medicare Other

## 2015-05-28 ENCOUNTER — Telehealth: Payer: Self-pay | Admitting: *Deleted

## 2015-05-28 VITALS — BP 150/74 | HR 64 | Temp 98.1°F

## 2015-05-28 VITALS — BP 110/64 | HR 74 | Temp 99.2°F | Resp 16

## 2015-05-28 DIAGNOSIS — D701 Agranulocytosis secondary to cancer chemotherapy: Secondary | ICD-10-CM

## 2015-05-28 DIAGNOSIS — Z79899 Other long term (current) drug therapy: Secondary | ICD-10-CM

## 2015-05-28 DIAGNOSIS — C561 Malignant neoplasm of right ovary: Secondary | ICD-10-CM

## 2015-05-28 DIAGNOSIS — E860 Dehydration: Secondary | ICD-10-CM | POA: Diagnosis not present

## 2015-05-28 DIAGNOSIS — R509 Fever, unspecified: Secondary | ICD-10-CM | POA: Diagnosis not present

## 2015-05-28 DIAGNOSIS — D61810 Antineoplastic chemotherapy induced pancytopenia: Secondary | ICD-10-CM

## 2015-05-28 DIAGNOSIS — Z95828 Presence of other vascular implants and grafts: Secondary | ICD-10-CM

## 2015-05-28 DIAGNOSIS — R971 Elevated cancer antigen 125 [CA 125]: Secondary | ICD-10-CM

## 2015-05-28 DIAGNOSIS — C569 Malignant neoplasm of unspecified ovary: Secondary | ICD-10-CM

## 2015-05-28 DIAGNOSIS — T451X5A Adverse effect of antineoplastic and immunosuppressive drugs, initial encounter: Secondary | ICD-10-CM

## 2015-05-28 DIAGNOSIS — C562 Malignant neoplasm of left ovary: Secondary | ICD-10-CM | POA: Diagnosis not present

## 2015-05-28 LAB — CBC WITH DIFFERENTIAL/PLATELET
BASO%: 0.3 % (ref 0.0–2.0)
BASOS ABS: 0 10*3/uL (ref 0.0–0.1)
EOS%: 0.2 % (ref 0.0–7.0)
Eosinophils Absolute: 0 10*3/uL (ref 0.0–0.5)
HCT: 31.9 % — ABNORMAL LOW (ref 34.8–46.6)
HGB: 10.9 g/dL — ABNORMAL LOW (ref 11.6–15.9)
LYMPH%: 12.4 % — ABNORMAL LOW (ref 14.0–49.7)
MCH: 33.4 pg (ref 25.1–34.0)
MCHC: 34.1 g/dL (ref 31.5–36.0)
MCV: 98 fL (ref 79.5–101.0)
MONO#: 0 10*3/uL — ABNORMAL LOW (ref 0.1–0.9)
MONO%: 0.6 % (ref 0.0–14.0)
NEUT%: 86.5 % — ABNORMAL HIGH (ref 38.4–76.8)
NEUTROS ABS: 3.8 10*3/uL (ref 1.5–6.5)
PLATELETS: 78 10*3/uL — AB (ref 145–400)
RBC: 3.25 10*6/uL — AB (ref 3.70–5.45)
RDW: 14.1 % (ref 11.2–14.5)
WBC: 4.4 10*3/uL (ref 3.9–10.3)
lymph#: 0.5 10*3/uL — ABNORMAL LOW (ref 0.9–3.3)

## 2015-05-28 LAB — URINALYSIS, MICROSCOPIC - CHCC
Bilirubin (Urine): NEGATIVE
Blood: NEGATIVE
Glucose: NEGATIVE mg/dL
Ketones: NEGATIVE mg/dL
LEUKOCYTE ESTERASE: NEGATIVE
Nitrite: NEGATIVE
PROTEIN: NEGATIVE mg/dL
RBC / HPF: NEGATIVE (ref 0–2)
Specific Gravity, Urine: 1.005 (ref 1.003–1.035)
Urobilinogen, UR: 0.2 mg/dL (ref 0.2–1)
pH: 8.5 (ref 4.6–8.0)

## 2015-05-28 MED ORDER — TBO-FILGRASTIM 300 MCG/0.5ML ~~LOC~~ SOSY
300.0000 ug | PREFILLED_SYRINGE | Freq: Once | SUBCUTANEOUS | Status: AC
  Administered 2015-05-28: 300 ug via SUBCUTANEOUS
  Filled 2015-05-28: qty 0.5

## 2015-05-28 NOTE — Patient Instructions (Signed)

## 2015-05-28 NOTE — Telephone Encounter (Signed)
TC from patient. She states she had chemo on Monday (Taxol, Carboplatin, Avastin). She came in on Tuesday for IVF's and Granix injection, and Granix injection again this morning.   Pt states she has a fever of 102.3.  Denies any other symptoms other than chills and fatigue. Has not taken any Tylenol. Advised to take 2 tylenol and then will have her seen in [facility] by Selena Lesser, NP @ 3:15pm after labs @ 3p.  Pt verbalizes understanding.

## 2015-05-28 NOTE — Progress Notes (Signed)
Day 2 of Granix given

## 2015-05-29 ENCOUNTER — Ambulatory Visit (HOSPITAL_BASED_OUTPATIENT_CLINIC_OR_DEPARTMENT_OTHER): Payer: Medicare Other

## 2015-05-29 VITALS — BP 139/68 | HR 77 | Temp 99.3°F

## 2015-05-29 DIAGNOSIS — Z5189 Encounter for other specified aftercare: Secondary | ICD-10-CM

## 2015-05-29 DIAGNOSIS — C562 Malignant neoplasm of left ovary: Secondary | ICD-10-CM

## 2015-05-29 DIAGNOSIS — C561 Malignant neoplasm of right ovary: Secondary | ICD-10-CM | POA: Diagnosis not present

## 2015-05-29 DIAGNOSIS — R971 Elevated cancer antigen 125 [CA 125]: Secondary | ICD-10-CM

## 2015-05-29 DIAGNOSIS — C569 Malignant neoplasm of unspecified ovary: Secondary | ICD-10-CM

## 2015-05-29 LAB — URINE CULTURE

## 2015-05-29 MED ORDER — SODIUM CHLORIDE 0.9 % IJ SOLN
10.0000 mL | INTRAMUSCULAR | Status: DC | PRN
  Administered 2015-05-28: 10 mL via INTRAVENOUS
  Filled 2015-05-29: qty 10

## 2015-05-29 MED ORDER — TBO-FILGRASTIM 300 MCG/0.5ML ~~LOC~~ SOSY
300.0000 ug | PREFILLED_SYRINGE | Freq: Once | SUBCUTANEOUS | Status: AC
  Administered 2015-05-29: 300 ug via SUBCUTANEOUS
  Filled 2015-05-29: qty 0.5

## 2015-05-29 MED ORDER — HEPARIN SOD (PORK) LOCK FLUSH 100 UNIT/ML IV SOLN
500.0000 [IU] | Freq: Once | INTRAVENOUS | Status: DC
  Filled 2015-05-29: qty 5

## 2015-05-29 NOTE — Patient Instructions (Signed)

## 2015-05-30 ENCOUNTER — Ambulatory Visit (HOSPITAL_BASED_OUTPATIENT_CLINIC_OR_DEPARTMENT_OTHER): Payer: Medicare Other

## 2015-05-30 VITALS — BP 147/74 | HR 68 | Temp 97.7°F

## 2015-05-30 DIAGNOSIS — Z5189 Encounter for other specified aftercare: Secondary | ICD-10-CM | POA: Diagnosis not present

## 2015-05-30 DIAGNOSIS — C561 Malignant neoplasm of right ovary: Secondary | ICD-10-CM

## 2015-05-30 DIAGNOSIS — C562 Malignant neoplasm of left ovary: Secondary | ICD-10-CM

## 2015-05-30 DIAGNOSIS — R971 Elevated cancer antigen 125 [CA 125]: Secondary | ICD-10-CM

## 2015-05-30 DIAGNOSIS — C569 Malignant neoplasm of unspecified ovary: Secondary | ICD-10-CM

## 2015-05-30 MED ORDER — TBO-FILGRASTIM 300 MCG/0.5ML ~~LOC~~ SOSY
300.0000 ug | PREFILLED_SYRINGE | Freq: Once | SUBCUTANEOUS | Status: AC
  Administered 2015-05-30: 300 ug via SUBCUTANEOUS
  Filled 2015-05-30: qty 0.5

## 2015-05-31 ENCOUNTER — Ambulatory Visit (HOSPITAL_BASED_OUTPATIENT_CLINIC_OR_DEPARTMENT_OTHER): Payer: Medicare Other

## 2015-05-31 VITALS — BP 151/76 | HR 70 | Temp 97.9°F

## 2015-05-31 DIAGNOSIS — Z5189 Encounter for other specified aftercare: Secondary | ICD-10-CM | POA: Diagnosis not present

## 2015-05-31 DIAGNOSIS — C561 Malignant neoplasm of right ovary: Secondary | ICD-10-CM

## 2015-05-31 DIAGNOSIS — C562 Malignant neoplasm of left ovary: Secondary | ICD-10-CM

## 2015-05-31 DIAGNOSIS — C569 Malignant neoplasm of unspecified ovary: Secondary | ICD-10-CM

## 2015-05-31 DIAGNOSIS — R971 Elevated cancer antigen 125 [CA 125]: Secondary | ICD-10-CM

## 2015-05-31 MED ORDER — TBO-FILGRASTIM 300 MCG/0.5ML ~~LOC~~ SOSY
300.0000 ug | PREFILLED_SYRINGE | Freq: Once | SUBCUTANEOUS | Status: AC
  Administered 2015-05-31: 300 ug via SUBCUTANEOUS

## 2015-06-02 ENCOUNTER — Other Ambulatory Visit: Payer: Self-pay | Admitting: Oncology

## 2015-06-02 ENCOUNTER — Encounter: Payer: Self-pay | Admitting: Nurse Practitioner

## 2015-06-02 DIAGNOSIS — D61810 Antineoplastic chemotherapy induced pancytopenia: Secondary | ICD-10-CM | POA: Insufficient documentation

## 2015-06-02 DIAGNOSIS — T451X5A Adverse effect of antineoplastic and immunosuppressive drugs, initial encounter: Secondary | ICD-10-CM | POA: Insufficient documentation

## 2015-06-02 DIAGNOSIS — E860 Dehydration: Secondary | ICD-10-CM | POA: Insufficient documentation

## 2015-06-02 NOTE — Assessment & Plan Note (Signed)
Pt reports fever to max of 102.3 overnite; and continued fatigue- but no other symptoms. Pt is receiving a total of 5 Granix injections this week for growth factor support. WBC 4.4; and ANC 3.8.  Pt is afebrile on exam today;and all other vitals stable.   UA normal.  Urine culture and blood cultures pending.   On exam- pt appears nontoxic.  Normal exam. Advised pt to take Tylenol for any recurring fever; and to call/return or go directly ot the ED for worsening symptoms.   Will await cultures prior to prescribing antibiotics; unless pt develops symptoms.

## 2015-06-02 NOTE — Assessment & Plan Note (Signed)
WBC 4.4, ANC 3.8, HGB 10.9 and PLT count 78.  Pt denies any worsening bruising or bleeding at present time.  Pt is receiving Granix injections for a total of 5 days this week for growth factor support. Pt afebrile on exam.

## 2015-06-02 NOTE — Progress Notes (Signed)
SYMPTOM MANAGEMENT CLINIC   HPI: [name] 74 y.o. female diagnosed with ovarian cancer.  Currently undergoing taxol/carboplatin/avastin chemotherapy.   Pt reports fever to max of 102.3 overnite; and continued fatigue- but no other symptoms. Pt is receiving a total of 5 Granix injections this week for growth factor support. WBC 4.4; and ANC 3.8.  Pt is afebrile on exam today;and all other vitals stable.   UA normal.  Urine culture and blood cultures pending.   HPI  ROS  Past Medical History  Diagnosis Date  . Hypertension   . Dyslipidemia   . Hypothyroidism   . Arthritis     OSTEOARTHRITIS   -- CONSTANT PAIN RIGHT HIP---AND PAIN LEFT KNEE--PT STATES SHE GETS INJECTIONS INTO HER KNEE  . Ovarian cancer   . PAF (paroxysmal atrial fibrillation)     Only on Plavix -- not full Anticoagulation per pt. request  . Complication of anesthesia     BLOOD PRESSURE DROPPED WITH NASAL SURGERY, ONE OF THE CARPAL TUNNEL REPAIRS AND DURING A COLONOSCOPY  . History of skin cancer   . Constipation     Past Surgical History  Procedure Laterality Date  . Bilateral carpal tunnel repair  2007  . Dilation and curettage of uterus  1969  . Surgery for ruptured ovarian cyst  1969  . Rhinoplasty for fractured nose  1986  . Left knee arthroscopy   2011  . Total hip arthroplasty  02/25/2012    Procedure: TOTAL HIP ARTHROPLASTY ANTERIOR APPROACH;  Surgeon: Mcarthur Rossetti, MD;  Location: [facility];  Service: Orthopedics;  Laterality: Right;  . Tonsillectomy  1962  . Laparotomy Bilateral 02/13/2013    Procedure: EXPLORATORY LAPAROTOMY TOTAL ABDOMINAL HYSTERECTOMY BILATERAL SALPINGO-OOPHORECTOMY, Partial Rectal Resection with Reanastamosis;  Surgeon: Alvino Chapel, MD;  Location: [facility];  Service: Gynecology;  Laterality: Bilateral;  . Omentectomy  02/13/2013    Procedure: OMENTECTOMY;  Surgeon: Alvino Chapel, MD;  Location: [facility];  Service: Gynecology;;  . Lymphadenectomy  Right 02/13/2013    Procedure: PEVLIC  LYMPHADENECTOMY, DEBULKING right pelvic tumor nodules;  Surgeon: Alvino Chapel, MD;  Location: [facility];  Service: Gynecology;  Laterality: Right;  . Total abdominal hysterectomy  01/2013  . Transthoracic echocardiogram  May 2014    Normal LV size function. EF 60-65%. Grade 1 diastolic function. Mild MR and mildly elevated PA pressures of 37 mmHg per  . Nm myoview ltd  June 2010     subbmaximal with no ischemia or infarction.  . 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    is allergic to ceftin; codeine; flexeril; levaquin; lisinopril; tape; and ultram.    Medication List       This list is accurate as of: 05/28/15 11:59 PM.  Always use your most recent med list.               amoxicillin 500 MG capsule  Commonly known as:  AMOXIL  aspirin EC 81 MG tablet  Take 81 mg by mouth every morning.     BENEFIBER Powd  Take 2 scoop by mouth every morning.     bisacodyl 5 MG EC tablet  Commonly known as:  DULCOLAX  Take 5 mg by mouth daily as needed for moderate constipation.     clopidogrel 75 MG tablet  Commonly known as:  PLAVIX  TAKE 1 TABLET BY MOUTH AT BEDTIME     desonide 0.05 % cream  Commonly known as:  DESOWEN  Apply  1 application topically 2 (two) times daily as needed (rash).     dexamethasone 4 MG tablet  Commonly known as:  DECADRON  Take 5 tabs with food (=76m) 12 hrs and 6 hrs prior to chemotherapy.     docusate sodium 100 MG capsule  Commonly known as:  COLACE  Take 100 mg by mouth 2 (two) times daily as needed for mild constipation.     estradiol 1 MG tablet  Commonly known as:  ESTRACE  Take 1 tablet (1 mg total) by mouth every morning. PT STATES SHE WANTS ESTRACE--NOT THE GENERIC     famotidine 10 MG tablet  Commonly known as:  PEPCID  Take 10 mg by mouth daily.     GLUCOSAMINE PO  Take 1,000 mg by mouth every morning.     ICY HOT EX  Apply 1 application topically daily as needed (pain.).     levothyroxine 88 MCG tablet  Commonly known as:  SYNTHROID, LEVOTHROID  Take 88 mcg by mouth every morning. Takes on an empty stomach. PT STATES SHE NEEDS THE SYNTHROID--DOES NOT WANT THE GENERIC     lidocaine-prilocaine cream  Commonly known as:  EMLA  Apply to Porta-Cath site 1-2 hours prior to access as directed.     LORazepam 1 MG tablet  Commonly known as:  ATIVAN  Place 1/2 -1 tablet under the tongue or swallow every 6 hrs as needed for nausea. Will make drowsy.     metoprolol tartrate 25 MG tablet  Commonly known as:  LOPRESSOR  Take 1.5 tablets (37.5 mg total) by mouth 2 (two) times daily.     multivitamin with minerals Tabs tablet  Take 1 tablet by mouth every morning.     ondansetron 8 MG tablet  Commonly known as:  ZOFRAN  Take 1 tablet (8 mg total) by mouth every 8 (eight) hours as needed for nausea or vomiting (Will not make drowsy).     polyethylene glycol packet  Commonly known as:  MIRALAX / GLYCOLAX  Take 17 g by mouth every morning.     potassium chloride 10 MEQ tablet  Commonly known as:  K-DUR  Take 10 mEq by mouth every Monday, Wednesday, and Friday.     propafenone 225 MG tablet  Commonly known as:  RYTHMOL  Take 1 tablet (225 mg total) by mouth 2  (two) times daily.     SYSTANE OP  Place 1 drop into both eyes 2 (two) times daily.         PHYSICAL EXAMINATION  Oncology Vitals 05/31/2015 05/30/2015 05/29/2015 05/28/2015 05/28/2015 05/27/2015 05/27/2015  Height - - - - - - -  Weight - - - - - - -  Weight (lbs) - - - - - - -  BMI (kg/m2) - - - - - - -  Temp 97.9 97.7 99.3 99.2 98.1 97.3 97.1  Pulse 70 68 77 74 64 57 65  Resp - - - 16 -  16 18  SpO2 - - - 98 100 100 100  BSA (m2) - - - - - - -   BP Readings from Last 3 Encounters:  05/31/15 151/76  05/30/15 147/74  05/29/15 139/68    Physical Exam  Constitutional: She is oriented to person, place, and time and well-developed, well-nourished, and in no distress.  HENT:  Head: Normocephalic and atraumatic.  Mouth/Throat: Oropharynx is clear and moist.  Eyes: Conjunctivae and EOM are normal. Pupils are equal, round, and reactive to light. Right eye exhibits no discharge. Left eye exhibits no discharge. No scleral icterus.  Neck: Normal range of motion. Neck supple. No JVD present. No tracheal deviation present. No thyromegaly present.  Cardiovascular: Normal rate, regular rhythm, normal heart sounds and intact distal pulses.   Pulmonary/Chest: Effort normal and breath sounds normal. No respiratory distress. She has no wheezes. She has no rales. She exhibits no tenderness.  Abdominal: Soft. Bowel sounds are normal. She exhibits no distension and no mass. There is no tenderness. There is no rebound and no guarding.  Musculoskeletal: Normal range of motion. She exhibits no edema or tenderness.  Lymphadenopathy:    She has no cervical adenopathy.  Neurological: She is alert and oriented to person, place, and time.  Skin: Skin is warm and dry. No rash noted. No erythema. No pallor.  Psychiatric: Affect normal.  Nursing note and vitals reviewed.   LABORATORY DATA:. Clinical Support on 05/28/2015  Component Date Value Ref Range Status  . WBC 05/28/2015 4.4  3.9 - 10.3 10e3/uL Final  .  NEUT# 05/28/2015 3.8  1.5 - 6.5 10e3/uL Final  . HGB 05/28/2015 10.9* 11.6 - 15.9 g/dL Final  . HCT 05/28/2015 31.9* 34.8 - 46.6 % Final  . Platelets 05/28/2015 78* 145 - 400 10e3/uL Final  . MCV 05/28/2015 98.0  79.5 - 101.0 fL Final  . MCH 05/28/2015 33.4  25.1 - 34.0 pg Final  . MCHC 05/28/2015 34.1  31.5 - 36.0 g/dL Final  . RBC 05/28/2015 3.25* 3.70 - 5.45 10e6/uL Final  . RDW 05/28/2015 14.1  11.2 - 14.5 % Final  . lymph# 05/28/2015 0.5* 0.9 - 3.3 10e3/uL Final  . MONO# 05/28/2015 0.0* 0.1 - 0.9 10e3/uL Final  . Eosinophils Absolute 05/28/2015 0.0  0.0 - 0.5 10e3/uL Final  . Basophils Absolute 05/28/2015 0.0  0.0 - 0.1 10e3/uL Final  . NEUT% 05/28/2015 86.5* 38.4 - 76.8 % Final  . LYMPH% 05/28/2015 12.4* 14.0 - 49.7 % Final  . MONO% 05/28/2015 0.6  0.0 - 14.0 % Final  . EOS% 05/28/2015 0.2  0.0 - 7.0 % Final  . BASO% 05/28/2015 0.3  0.0 - 2.0 % Final  . Glucose 05/28/2015 Negative  Negative mg/dL Final  . Bilirubin (Urine) 05/28/2015 Negative  Negative Final  . Ketones 05/28/2015 Negative  Negative mg/dL Final  . Specific Gravity, Urine 05/28/2015 1.005  1.003 - 1.035 Final  . Blood 05/28/2015 Negative  Negative Final  . pH 05/28/2015 8.5  4.6 - 8.0 Final  . Protein 05/28/2015 Negative  Negative- <30 mg/dL Final  . Urobilinogen, UR 05/28/2015 0.2  0.2 - 1 mg/dL Final  . Nitrite 05/28/2015 Negative  Negative Final  . Leukocyte Esterase 05/28/2015 Negative  Negative Final  . RBC / HPF 05/28/2015 Negative  0 - 2 Final  . WBC, UA 05/28/2015 0-2  0 - 2 Final  . Epithelial Cells 05/28/2015 Few  Negative- Few Final  . Urine Culture, Routine 05/28/2015 Culture, Urine   Final  Comment: Final - ===== COLONY COUNT: ===== NO GROWTH NO GROWTH   Appointment on 05/26/2015  Component Date Value Ref Range Status  . WBC 05/26/2015 2.3* 3.9 - 10.3 10e3/uL Final  . NEUT# 05/26/2015 1.9  1.5 - 6.5 10e3/uL Final  . HGB 05/26/2015 12.0  11.6 - 15.9 g/dL Final  . HCT 05/26/2015 35.4  34.8 -  46.6 % Final  . Platelets 05/26/2015 155  145 - 400 10e3/uL Final  . MCV 05/26/2015 99.0  79.5 - 101.0 fL Final  . MCH 05/26/2015 33.6  25.1 - 34.0 pg Final  . MCHC 05/26/2015 33.9  31.5 - 36.0 g/dL Final  . RBC 05/26/2015 3.57* 3.70 - 5.45 10e6/uL Final  . RDW 05/26/2015 14.1  11.2 - 14.5 % Final  . lymph# 05/26/2015 0.4* 0.9 - 3.3 10e3/uL Final  . MONO# 05/26/2015 0.0* 0.1 - 0.9 10e3/uL Final  . Eosinophils Absolute 05/26/2015 0.0  0.0 - 0.5 10e3/uL Final  . Basophils Absolute 05/26/2015 0.0  0.0 - 0.1 10e3/uL Final  . NEUT% 05/26/2015 81.5* 38.4 - 76.8 % Final  . LYMPH% 05/26/2015 16.3  14.0 - 49.7 % Final  . MONO% 05/26/2015 0.6  0.0 - 14.0 % Final  . EOS% 05/26/2015 0.4  0.0 - 7.0 % Final  . BASO% 05/26/2015 1.2  0.0 - 2.0 % Final  . Sodium 05/26/2015 139  136 - 145 mEq/L Final  . Potassium 05/26/2015 4.0  3.5 - 5.1 mEq/L Final  . Chloride 05/26/2015 103  98 - 109 mEq/L Final  . CO2 05/26/2015 27  22 - 29 mEq/L Final  . Glucose 05/26/2015 192* 70 - 140 mg/dl Final  . BUN 05/26/2015 11.5  7.0 - 26.0 mg/dL Final  . Creatinine 05/26/2015 0.7  0.6 - 1.1 mg/dL Final  . Total Bilirubin 05/26/2015 0.54  0.20 - 1.20 mg/dL Final  . Alkaline Phosphatase 05/26/2015 80  40 - 150 U/L Final  . AST 05/26/2015 15  5 - 34 U/L Final  . ALT 05/26/2015 13  0 - 55 U/L Final  . Total Protein 05/26/2015 6.4  6.4 - 8.3 g/dL Final  . Albumin 05/26/2015 3.9  3.5 - 5.0 g/dL Final  . Calcium 05/26/2015 9.2  8.4 - 10.4 mg/dL Final  . Anion Gap 05/26/2015 9  3 - 11 mEq/L Final  . EGFR 05/26/2015 82* >90 ml/min/1.73 m2 Final   eGFR is calculated using the CKD-EPI Creatinine Equation (2009)     RADIOGRAPHIC STUDIES: No results found.  ASSESSMENT/PLAN:    Antineoplastic chemotherapy induced pancytopenia WBC 4.4, ANC 3.8, HGB 10.9 and PLT count 78.  Pt denies any worsening bruising or bleeding at present time.  Pt is receiving Granix injections for a total of 5 days this week for growth factor support.  Pt afebrile on exam.   Dehydration Pt to [place] today for preplanned IV fluid hydration.  Pt is c/o increased fatigue; and fever last nite- but no other new symptoms.   Fever Pt reports fever to max of 102.3 overnite; and continued fatigue- but no other symptoms. Pt is receiving a total of 5 Granix injections this week for growth factor support. WBC 4.4; and ANC 3.8.  Pt is afebrile on exam today;and all other vitals stable.   UA normal.  Urine culture and blood cultures pending.   On exam- pt appears nontoxic.  Normal exam. Advised pt to take Tylenol for any recurring fever; and to call/return or go directly ot the ED for worsening symptoms.  Will await cultures prior to prescribing antibiotics; unless pt develops symptoms.   Malignant neoplasm of ovary Pt received taxol/carbo/avastin on 05/26/15.  Pt will receive total of 5 Granix injections this week for growth factor support.   Pt to return 06/05/15 for labs and follow up visit.   Patient stated understanding of all instructions; and was in agreement with this plan of care. The patient knows to call the clinic with any problems, questions or concerns.   Review/collaboration with Dr. Marko Plume regarding all aspects of patient's visit today.   Total time spent with patient was 40 minutes;  with greater than 75 percent of that time spent in face to face counseling regarding patient's symptoms,  and coordination of care and follow up.  Disclaimer: This note was dictated with voice recognition software. Similar sounding words can inadvertently be transcribed and may not be corrected upon review.   Drue Second, NP 06/02/2015

## 2015-06-02 NOTE — Assessment & Plan Note (Signed)
Pt received taxol/carbo/avastin on 05/26/15.  Pt will receive total of 5 Granix injections this week for growth factor support.   Pt to return 06/05/15 for labs and follow up visit.

## 2015-06-02 NOTE — Assessment & Plan Note (Signed)
Pt to [place] today for preplanned IV fluid hydration.  Pt is c/o increased fatigue; and fever last nite- but no other new symptoms.

## 2015-06-03 ENCOUNTER — Ambulatory Visit: Payer: Medicare Other

## 2015-06-03 LAB — CULTURE, BLOOD (SINGLE)

## 2015-06-04 ENCOUNTER — Ambulatory Visit: Payer: Medicare Other | Admitting: Cardiology

## 2015-06-05 ENCOUNTER — Encounter: Payer: Self-pay | Admitting: Oncology

## 2015-06-05 ENCOUNTER — Telehealth: Payer: Self-pay | Admitting: Cardiology

## 2015-06-05 ENCOUNTER — Ambulatory Visit (HOSPITAL_BASED_OUTPATIENT_CLINIC_OR_DEPARTMENT_OTHER): Payer: Medicare Other | Admitting: Oncology

## 2015-06-05 ENCOUNTER — Other Ambulatory Visit (HOSPITAL_BASED_OUTPATIENT_CLINIC_OR_DEPARTMENT_OTHER): Payer: Medicare Other

## 2015-06-05 ENCOUNTER — Telehealth: Payer: Self-pay | Admitting: Oncology

## 2015-06-05 VITALS — BP 179/80 | HR 68 | Temp 97.8°F | Resp 18 | Ht 63.0 in | Wt 131.6 lb

## 2015-06-05 DIAGNOSIS — C563 Malignant neoplasm of bilateral ovaries: Secondary | ICD-10-CM

## 2015-06-05 DIAGNOSIS — D701 Agranulocytosis secondary to cancer chemotherapy: Secondary | ICD-10-CM

## 2015-06-05 DIAGNOSIS — K219 Gastro-esophageal reflux disease without esophagitis: Secondary | ICD-10-CM

## 2015-06-05 DIAGNOSIS — C561 Malignant neoplasm of right ovary: Secondary | ICD-10-CM | POA: Diagnosis not present

## 2015-06-05 DIAGNOSIS — C7989 Secondary malignant neoplasm of other specified sites: Secondary | ICD-10-CM | POA: Diagnosis not present

## 2015-06-05 DIAGNOSIS — C562 Malignant neoplasm of left ovary: Secondary | ICD-10-CM

## 2015-06-05 DIAGNOSIS — R509 Fever, unspecified: Secondary | ICD-10-CM

## 2015-06-05 DIAGNOSIS — C569 Malignant neoplasm of unspecified ovary: Secondary | ICD-10-CM

## 2015-06-05 DIAGNOSIS — Z95828 Presence of other vascular implants and grafts: Secondary | ICD-10-CM

## 2015-06-05 DIAGNOSIS — T451X5A Adverse effect of antineoplastic and immunosuppressive drugs, initial encounter: Secondary | ICD-10-CM

## 2015-06-05 DIAGNOSIS — E039 Hypothyroidism, unspecified: Secondary | ICD-10-CM

## 2015-06-05 LAB — CBC WITH DIFFERENTIAL/PLATELET
BASO%: 0.5 % (ref 0.0–2.0)
Basophils Absolute: 0 10*3/uL (ref 0.0–0.1)
EOS%: 0.2 % (ref 0.0–7.0)
Eosinophils Absolute: 0 10*3/uL (ref 0.0–0.5)
HCT: 33.4 % — ABNORMAL LOW (ref 34.8–46.6)
HGB: 11.3 g/dL — ABNORMAL LOW (ref 11.6–15.9)
LYMPH%: 35.3 % (ref 14.0–49.7)
MCH: 32.9 pg (ref 25.1–34.0)
MCHC: 33.8 g/dL (ref 31.5–36.0)
MCV: 97.4 fL (ref 79.5–101.0)
MONO#: 0.7 10*3/uL (ref 0.1–0.9)
MONO%: 11.2 % (ref 0.0–14.0)
NEUT#: 3.1 10*3/uL (ref 1.5–6.5)
NEUT%: 52.8 % (ref 38.4–76.8)
NRBC: 1 % — AB (ref 0–0)
PLATELETS: 122 10*3/uL — AB (ref 145–400)
RBC: 3.43 10*6/uL — AB (ref 3.70–5.45)
RDW: 14.5 % (ref 11.2–14.5)
WBC: 5.8 10*3/uL (ref 3.9–10.3)
lymph#: 2.1 10*3/uL (ref 0.9–3.3)

## 2015-06-05 LAB — COMPREHENSIVE METABOLIC PANEL (CC13)
ALBUMIN: 3.8 g/dL (ref 3.5–5.0)
ALT: 19 U/L (ref 0–55)
ANION GAP: 8 meq/L (ref 3–11)
AST: 24 U/L (ref 5–34)
Alkaline Phosphatase: 89 U/L (ref 40–150)
BILIRUBIN TOTAL: 0.42 mg/dL (ref 0.20–1.20)
BUN: 8.4 mg/dL (ref 7.0–26.0)
CHLORIDE: 102 meq/L (ref 98–109)
CO2: 28 meq/L (ref 22–29)
Calcium: 9.3 mg/dL (ref 8.4–10.4)
Creatinine: 0.7 mg/dL (ref 0.6–1.1)
EGFR: 89 mL/min/{1.73_m2} — ABNORMAL LOW (ref >90)
Glucose: 104 mg/dl (ref 70–140)
POTASSIUM: 3.9 meq/L (ref 3.5–5.1)
Sodium: 139 mEq/L (ref 136–145)
TOTAL PROTEIN: 6.1 g/dL — AB (ref 6.4–8.3)

## 2015-06-05 LAB — UA PROTEIN, DIPSTICK - CHCC: Protein, ur: NEGATIVE mg/dL

## 2015-06-05 MED ORDER — PANTOPRAZOLE SODIUM 40 MG PO TBEC: 40.0000 mg | DELAYED_RELEASE_TABLET | Freq: Every day | ORAL | Status: DC

## 2015-06-05 NOTE — Telephone Encounter (Signed)
Appointments made,patient has avs from previous appointments and has next dates,another pof sent and appointments added,patient will get a new schedule 7/14

## 2015-06-05 NOTE — Progress Notes (Signed)
OFFICE PROGRESS NOTE   June 05, 2015   Physicians:D. 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  She was seen at symptom management clinic at [facility] on day 3 with fever and fatigue, counts and labs ok, no localizing symptoms, urine culture negative, treated symptomatically and resolved. This was very similar to situation after cycle 1.   Patient was very fatigued x 1 week after chemo, spending most of her time on couch, which she disliked very much. She did not have severe taxol or gCSF aches and nausea was controlled. She noticed palpitations more than usual intermittently that full week, no associated chest pain or SOB, did not check heart rate with these. No other fever. She has slight numbness left first and second toes only, not interfering with activity, no other peripheral neuropathy. PAC functioned well. Bowels are moving. No bleeding. No complaints of HA, no LE swelling. She had sore tongue and probable aphthous ulcer on lower lip last week, improved. OTC Pepcid once daily has not helped GERD.      PAC placed by IR 04-21-15 No genetics counseling located in this EMR. Message to genetics counselors to confirm.  CA 125 on 05-01-15 33  ONCOLOGIC HISTORY Patient presented to Dr Abbie Sons in late Jan 2014 after bright red spotting that AM following 2 weeks of spotting in early Dec 2015. with uterus larger than had been apparent on exam in Oct 2013. Sonohystogram 01-05-13 showed uterus normal size and echotexture, endometrium 4.3 mm, left ovary normal and right adnexa with 1.1 x 8.4.x8.2 cm cystic and solid mass. Endometrial biopsy benign and CA 125  also on 01-05-13 was 178.8. She had CT AP 01-17-13 with 1.0 x 6.9 x 8.9 cm complex right ovarian mass, no ascites, small retroperitoneal nodes. She was seen by Dr Skeet Latch on 01-18-13 and taken to surgery by Dr Josephina Shih on 02-13-13, which was TAH/BSO/ omentectomy/ureterolysis/ resection of cul-de-sac tumor/right pelvic lymphadnectomy and resection of rectum with reanastomosis. At completion of surgery there was no gross residual disease. 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 Washings [phone]) had rare clusters of atypical cells. Chemotherapy with dose dense taxol carboplatin was begun day 1 cycle 1 on 03-20-13; ANC was 1.1 on day 15 cycle 1 with taxol given and neupogen added 04-04-13. She had day 1 cycle 2 treatment on 04-17-13, then was briefly hospitalized 5-22 to 04-20-13 after syncopal episode, with antihypertensive agents DCd and UTI treated. She was feeling much better at time of "day 8" cycle 2 on 05-01-13 and did have neupogen 300 mcg x 1 dose on 05-02-13. She was readmitted to hospital 6-5 thru 05-05-13 with fever, empirically on antibiotics and blood cultures negative. Cycle 6 completed 08-14-13. CT AP 09-25-13 had no evidence of cancer and CA 125 was 18.3 on 07-24-2013. Marker was 20 in early Dec 2015, 26 on 01-31-15 and 35 on 03-14-15. PET 04-03-15 documented disease bilateral pelvic sidewalls, vaginal cuff and scattered areas thru abdomen/ pelvis, as well as area of uptake in spleen.  She resumed carboplatin taxol using q 3 week regimen on 05-01-15. She was neutropenic with ANC 0.3 on day 9 despite beginning granix on day 5.Avastin added with cycle 2 chemo on 05-26-15.   Review of systems as above, also: No abdominal or pelvic pain. No SOB. Eating and  drinking adequately now. Bladder ok Remainder of 10 point Review of Systems negative.  Objective:  Vital signs in last 24 hours:  BP 179/80 mmHg  Pulse 68  Temp(Src) 97.8 F (36.6 C) (Oral)  Resp 18  Ht 5' 3"  (1.6 m)  Wt 131 lb 9.6 oz (59.693 kg)  BMI 23.32 kg/m2  SpO2 100% Weight down 0.5 lb. Looks comfortable. Daughter very supportive Alert, oriented and appropriate. Ambulatory without difficulty.  Wearing wig  HEENT:PERRL, sclerae not icteric. Oral mucosa moist without lesions including tongue and lower lip mucosa, posterior pharynx clear.  Neck supple. No JVD.  Lymphatics:no cervical,supraclavicular, axillary or inguinal adenopathy Resp: clear to auscultation bilaterally and normal percussion bilaterally Cardio: regular rate and rhythm. No gallop. GI: soft, nontender, not distended, no mass or organomegaly. Normally active bowel sounds. Surgical incision not remarkable. Musculoskeletal/ Extremities: without pitting edema, cords, tenderness Neuro: no significant peripheral neuropathy. Otherwise nonfocal. PSYCH as above Skin without rash, ecchymosis, petechiae. Excoriated area posterior lower leg with slight erythema, etiology not known. Portacath-without erythema or tenderness, no ecchymosis  Lab Results:  Results for orders placed or performed in visit on 06/05/15  CBC with Differential  Result Value Ref Range   WBC 5.8 3.9 - 10.3 10e3/uL   NEUT# 3.1 1.5 - 6.5 10e3/uL   HGB 11.3 (L) 11.6 - 15.9 g/dL   HCT 33.4 (L) 34.8 - 46.6 %   Platelets 122 (L) 145 - 400 10e3/uL   MCV 97.4 79.5 - 101.0 fL   MCH 32.9 25.1 - 34.0 pg   MCHC 33.8 31.5 - 36.0 g/dL   RBC 3.43 (L) 3.70 - 5.45 10e6/uL   RDW 14.5 11.2 - 14.5 %   lymph# 2.1 0.9 - 3.3 10e3/uL   MONO# 0.7 0.1 - 0.9 10e3/uL   Eosinophils Absolute 0.0 0.0 - 0.5 10e3/uL   Basophils Absolute 0.0 0.0 - 0.1 10e3/uL   NEUT% 52.8 38.4 - 76.8 %   LYMPH% 35.3 14.0 - 49.7 %   MONO% 11.2 0.0 - 14.0 %   EOS% 0.2 0.0 - 7.0 %    BASO% 0.5 0.0 - 2.0 %   nRBC 1 (H) 0 - 0 %  Comprehensive metabolic panel (Cmet) - [facility]  Result Value Ref Range   Sodium 139 136 - 145 mEq/L   Potassium 3.9 3.5 - 5.1 mEq/L   Chloride 102 98 - 109 mEq/L   CO2 28 22 - 29 mEq/L   Glucose 104 70 - 140 mg/dl   BUN 8.4 7.0 - 26.0 mg/dL   Creatinine 0.7 0.6 - 1.1 mg/dL   Total Bilirubin 0.42 0.20 - 1.20 mg/dL   Alkaline Phosphatase 89 40 - 150 U/L   AST 24 5 - 34 U/L   ALT 19 0 - 55 U/L   Total Protein 6.1 (L) 6.4 - 8.3 g/dL   Albumin 3.8 3.5 - 5.0 g/dL   Calcium 9.3 8.4 - 10.4 mg/dL   Anion Gap 8 3 - 11 mEq/L   EGFR 89 (L) >90 ml/min/1.73 m2  Urine protein by dipstick - [facility]  Result Value Ref Range   Protein, ur Negative Negative- <30 mg/dL    CA 125 on 05-01-15   33  Studies/Results:  No results found.  Medications: I have reviewed the patient's current medications. Change OTC pepcid to Protonix 40 mg daily. Add Biotene mouthwash regularly. Discussed triamcinolone acetamide dental paste for aphthous ulcers, which we can prescribe if this problem recurs. She has not wanted to try neulasta. Topical antibiotic to area posterior leg, call if worse.  DISCUSSION Patient asks how many cycles of chemo she "has to have". I have explained that there is not treatment which can cure this recurrent disease, but hopefully the chemo and avastin will shrink known areas and slow progression. She understands that further surgery would not be curative and was not recommended by Dr Josephina Shih. I have told her that ~ 6 cycles of chemo is often a useful total number, tho this is adjusted depending on response, tolerance and patient's wishes. I have told her that she can choose to stop treatment at any time. She understands that we are trying as best possible to make these treatments tolerable for her, as well as hopefully improving situation with recurrent cancer. She does feel that q 3 week regimen is more tolerable than dose dense weekly.  I have  contrasted present goal of treatment with that of adjuvant chemotherapy, with goal of cure with adjuvant, tho unfortunately that was not outcome here. Hopefully recurrent disease will respond well enough to have treatment breaks, however expectation is that disease will become active at intervals, requiring further treatment if she wants that approach. Certainly it is in her favor that recurrence was at least 18 months since adjuvant chemo completed, and that she is asymptomatic from the disease now. She and daughter were a little tearful during this discussion.  Patient wants to continue treatment. She is most comfortable with repeat scans after ~ cycle 3, and will ask Dr Josephina Shih to see her again then also.  She saw Dr Ellyn Hack in 11-2014, last documented A fib ~ 2010, on ASA and plavix, follow up recommended one year with him. However, patient would be more comfortable seeing him soon re increase in palpitations. Note she has steroids around chemo, uses zofran (and ativan) for nausea. Carboplatin and taxol would not be expected to cause cardiac symptoms. PAC had tip in distal SVC by post placement CXR 05-08-15.   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 Gradual increase in CA125 March and April 2016 and PET evidence of involvement pelvis and abdomen, asymptomatic. Resumed chemotherapy with carboplatin and taxol using q 3 week dosing, cycle 1 05-01-15. Addition of avastin with cycle 2 05-26-15.  IVF day 2 and granix x 5 beginnig day 2, without neutropenia day 11 cycle 2 today. I will see her with labs 06-12-15 and she will be due cycle 3 on 06-16-15. We will repeat scans and ask Dr Josephina Shih to see her early August, to be sure disease is stable or improving on this treatment. 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 With increased palpitations will ask him to see again. Note on plavix and ASA if platelets drop with chemo.  5.previous constipation requiring increased laxatives with adjuvant chemo, up to date on colonoscopy 6.hypothyroid on replacement 7.right hip replacement 2013 also for osteoarthritis 8. Long estrogen replacement, which she still uses. She has previously  refused to discontinue the estrogen. Initial surgical path from 02-13-13 does not report ER PR testing. 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 Breast tissue heterogeneously dense by last tomo mammograms at [facility] 03-21-15, those mammograms otherwise negative.  10. Message sent to genetics counselors ? If testing done 11. Mouth symptoms: add biotene 12. GERD: add daily protonix   Time spent 30 min including >50% counseling and coordination of care. She knows that she can call if further questions or concerns before scheduled visit 06-12-15.     Emmit Oriley P, MD   06/05/2015, 1:21 PM

## 2015-06-05 NOTE — Telephone Encounter (Signed)
Called billie with dr harding and left a message for them/her to call the patient with an appointment

## 2015-06-06 LAB — CA 125: CA 125: 21 U/mL (ref <35)

## 2015-06-06 NOTE — Telephone Encounter (Signed)
Close encounter 

## 2015-06-11 ENCOUNTER — Other Ambulatory Visit: Payer: Self-pay | Admitting: Oncology

## 2015-06-11 ENCOUNTER — Telehealth: Payer: Self-pay | Admitting: Cardiology

## 2015-06-11 DIAGNOSIS — C7989 Secondary malignant neoplasm of other specified sites: Secondary | ICD-10-CM

## 2015-06-11 DIAGNOSIS — C569 Malignant neoplasm of unspecified ovary: Secondary | ICD-10-CM

## 2015-06-11 NOTE — Telephone Encounter (Signed)
Pt called in stating that she will be having a chemo treatment coming up next week and she states that the chemo induces her Afib. She would like some instructions on how to handle this when this happens. Please call  Thanks

## 2015-06-11 NOTE — Telephone Encounter (Signed)
I pretty much agree with Ovid Curd.  Not really a fan of rhythm control meds following Chemo.   1st option is take additional BB dose if Sx occur.    Will need to confer with Pharm - ? Interaction of chemo & Rhythmol - if safe, could consider increasing Rhythmol to tid.  Leonie Man, MD

## 2015-06-11 NOTE — Telephone Encounter (Signed)
Spoke w/ patient regarding her concerns. States 2 out of 2 times she has done chemo, heart palpitations occurred for about 1 week afterwards. Pt certain this is coming from chemo, either infusion or other component of treatment. Requested advice on what to do. Informed her that if symptoms occur to call us and let us know - if HR rapid enough and BP OK, physician may recommend extra dose of metoprolol to see if symptoms abate, and/or recommend visit to A Fib clinic.  Pt voiced understanding.  Has appt w/ Dr. Ellyn Hack on 7/25 - chemo is scheduled the week prior.

## 2015-06-12 ENCOUNTER — Telehealth: Payer: Self-pay | Admitting: Oncology

## 2015-06-12 ENCOUNTER — Other Ambulatory Visit (HOSPITAL_BASED_OUTPATIENT_CLINIC_OR_DEPARTMENT_OTHER): Payer: Medicare Other

## 2015-06-12 ENCOUNTER — Ambulatory Visit: Payer: Medicare Other

## 2015-06-12 ENCOUNTER — Ambulatory Visit (HOSPITAL_BASED_OUTPATIENT_CLINIC_OR_DEPARTMENT_OTHER): Payer: Medicare Other | Admitting: Oncology

## 2015-06-12 ENCOUNTER — Encounter: Payer: Self-pay | Admitting: Oncology

## 2015-06-12 VITALS — BP 152/73 | HR 66 | Temp 98.2°F | Resp 18 | Ht 63.0 in | Wt 132.0 lb

## 2015-06-12 DIAGNOSIS — C569 Malignant neoplasm of unspecified ovary: Secondary | ICD-10-CM

## 2015-06-12 DIAGNOSIS — C7989 Secondary malignant neoplasm of other specified sites: Secondary | ICD-10-CM

## 2015-06-12 DIAGNOSIS — E039 Hypothyroidism, unspecified: Secondary | ICD-10-CM | POA: Diagnosis not present

## 2015-06-12 DIAGNOSIS — Z79899 Other long term (current) drug therapy: Secondary | ICD-10-CM

## 2015-06-12 DIAGNOSIS — K5900 Constipation, unspecified: Secondary | ICD-10-CM

## 2015-06-12 DIAGNOSIS — C561 Malignant neoplasm of right ovary: Secondary | ICD-10-CM

## 2015-06-12 DIAGNOSIS — T451X5A Adverse effect of antineoplastic and immunosuppressive drugs, initial encounter: Secondary | ICD-10-CM

## 2015-06-12 DIAGNOSIS — D701 Agranulocytosis secondary to cancer chemotherapy: Secondary | ICD-10-CM

## 2015-06-12 DIAGNOSIS — Z95828 Presence of other vascular implants and grafts: Secondary | ICD-10-CM

## 2015-06-12 DIAGNOSIS — C562 Malignant neoplasm of left ovary: Secondary | ICD-10-CM

## 2015-06-12 DIAGNOSIS — C563 Malignant neoplasm of bilateral ovaries: Secondary | ICD-10-CM

## 2015-06-12 DIAGNOSIS — K5904 Chronic idiopathic constipation: Secondary | ICD-10-CM

## 2015-06-12 DIAGNOSIS — K219 Gastro-esophageal reflux disease without esophagitis: Secondary | ICD-10-CM

## 2015-06-12 LAB — CBC WITH DIFFERENTIAL/PLATELET
BASO%: 0.4 % (ref 0.0–2.0)
Basophils Absolute: 0 10*3/uL (ref 0.0–0.1)
EOS%: 0.3 % (ref 0.0–7.0)
Eosinophils Absolute: 0 10*3/uL (ref 0.0–0.5)
HCT: 33.7 % — ABNORMAL LOW (ref 34.8–46.6)
HEMOGLOBIN: 11.5 g/dL — AB (ref 11.6–15.9)
LYMPH%: 38.2 % (ref 14.0–49.7)
MCH: 33.5 pg (ref 25.1–34.0)
MCHC: 34 g/dL (ref 31.5–36.0)
MCV: 98.5 fL (ref 79.5–101.0)
MONO#: 0.5 10*3/uL (ref 0.1–0.9)
MONO%: 13.8 % (ref 0.0–14.0)
NEUT%: 47.3 % (ref 38.4–76.8)
NEUTROS ABS: 1.8 10*3/uL (ref 1.5–6.5)
Platelets: 238 10*3/uL (ref 145–400)
RBC: 3.42 10*6/uL — ABNORMAL LOW (ref 3.70–5.45)
RDW: 15.4 % — ABNORMAL HIGH (ref 11.2–14.5)
WBC: 3.8 10*3/uL — ABNORMAL LOW (ref 3.9–10.3)
lymph#: 1.4 10*3/uL (ref 0.9–3.3)

## 2015-06-12 LAB — COMPREHENSIVE METABOLIC PANEL (CC13)
ALBUMIN: 3.7 g/dL (ref 3.5–5.0)
ALT: 9 U/L (ref 0–55)
AST: 13 U/L (ref 5–34)
Alkaline Phosphatase: 69 U/L (ref 40–150)
Anion Gap: 6 mEq/L (ref 3–11)
BUN: 9.3 mg/dL (ref 7.0–26.0)
CHLORIDE: 105 meq/L (ref 98–109)
CO2: 28 mEq/L (ref 22–29)
CREATININE: 0.7 mg/dL (ref 0.6–1.1)
Calcium: 9.2 mg/dL (ref 8.4–10.4)
EGFR: 89 mL/min/{1.73_m2} — ABNORMAL LOW (ref >90)
Glucose: 99 mg/dl (ref 70–140)
POTASSIUM: 3.9 meq/L (ref 3.5–5.1)
SODIUM: 140 meq/L (ref 136–145)
Total Bilirubin: 0.54 mg/dL (ref 0.20–1.20)
Total Protein: 6 g/dL — ABNORMAL LOW (ref 6.4–8.3)

## 2015-06-12 NOTE — Progress Notes (Signed)
OFFICE PROGRESS NOTE   June 12, 2015   Physicians:D. 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 She had carbo taxol on 05-01-15, then addition of avastin to carbo taxol on 05-26-15 (avastin held cycle 1 for PAC placement).  Plan is for repeat CT AP prior to seeing Dr ClarkePearson again on 07-04-15.  Both of the first two chemotherapy treatments have been complicated by ~ a week of fatigue afterwards, and by fever spikes on day 6 cycle 1 (102.2) and day 3 cycle 2 (102.3) with negative exam and cultures both times. She needed granix x5 following cycle 2 (began day 2). CA 125 was 33 on 05-01-15 and 21 on 06-05-15.  Patient is feeling well overall today. She has had no further fever and no symptoms of infection. The PAC is not uncomfortable, functioning well. She is eating and trying to drink fluids; protonix has not helped GERD since this was begun recently. Bowels are moving, but not well, this a chronic problem. She denies abdominal or pelvic pain and denies any bleeding. She has no nausea now. She denies peripheral neuropathy.   She spoke with cardiology, instructed to take prn medication if Afib symptomatic prior to return visit there on 06-23-15; she has had no palpitations or symptoms of the Afib in last several days.She saw Dr Harding in 11-2014, last documented A fib ~ 2010, on ASA and plavix. Note she has steroids around chemo, uses zofran for nausea. Carboplatin and taxol would not be expected to cause cardiac symptoms. PAC had tip in distal SVC by post placement CXR 05-08-15..     PAC placed by IR 04-21-15 No genetics counseling located in this EMR. Message to genetics counselors to confirm.  CA 125 on 05-01-15 33  Patient and husband feel more comfortable with situation  since Dr Russo explained that this recurrent disease is more of a chronic illness than "a cancer that has to be cured" now    ONCOLOGIC HISTORY Patient presented to Dr Tim Fontaine in late Jan 2014 after bright red spotting that AM following 2 weeks of spotting in early Dec 2015. with uterus larger than had been apparent on exam in Oct 2013. Sonohystogram 01-05-13 showed uterus normal size and echotexture, endometrium 4.3 mm, left ovary normal and right adnexa with 1.1 x 8.4.x8.2 cm cystic and solid mass. Endometrial biopsy benign and CA 125 also on 01-05-13 was 178.8. She had CT AP 01-17-13 with 1.0 x 6.9 x 8.9 cm complex right ovarian mass, no ascites, small retroperitoneal nodes. She was seen by Dr Brewster on 01-18-13 and taken to surgery by Dr ClarkePearson on 02-13-13, which was TAH/BSO/ omentectomy/ureterolysis/ resection of cul-de-sac tumor/right pelvic lymphadnectomy and resection of rectum with reanastomosis. At completion of surgery there was no gross residual disease. 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 Washings ([id number]) had rare clusters of atypical cells. Chemotherapy with dose dense taxol carboplatin was begun day 1 cycle 1 on 03-20-13; ANC was 1.1 on day 15 cycle 1 with taxol given and neupogen added 04-04-13. She had day 1 cycle 2 treatment on 04-17-13, then was briefly hospitalized 5-22 to 04-20-13 after syncopal episode,   with antihypertensive agents DCd and UTI treated. She was feeling much better at time of "day 8" cycle 2 on 05-01-13 and did have neupogen 300 mcg x 1 dose on 05-02-13. She was readmitted to hospital 6-5 thru 05-05-13 with fever, empirically on antibiotics and blood cultures negative. Cycle 6 completed 08-14-13. CT AP 09-25-13  had no evidence of cancer and CA 125 was 18.3 on 07-24-2013. Marker was 20 in early Dec 2015, 26 on 01-31-15 and 35 on 03-14-15. PET 04-03-15 documented disease bilateral pelvic sidewalls, vaginal cuff and scattered areas thru abdomen/ pelvis, as well as area of uptake in spleen. She resumed carboplatin taxol using q 3 week regimen on 05-01-15. She was neutropenic with ANC 0.3 on day 9 despite beginning granix on day 5.Avastin added with cycle 2 chemo on 05-26-15.    Review of systems as above, also: No symptoms of infection. No SOB or significant cough. No chest pain. No LE swelling. No bleeding. Did sleep last pm. Mouth soreness resolved with Biotene, which she is now using regularly Remainder of 10 point Review of Systems negative.  Objective:  Vital signs in last 24 hours:  BP 152/73 mmHg  Pulse 66  Temp(Src) 98.2 F (36.8 C) (Oral)  Resp 18  Ht 5' 3" (1.6 m)  Wt 132 lb (59.875 kg)  BMI 23.39 kg/m2 Weight stable Alert, oriented and appropriate. Ambulatory without difficulty.  Alopecia  HEENT:PERRL, sclerae not icteric. Oral mucosa moist without lesions, posterior pharynx clear.  Neck supple. No JVD.  Lymphatics:no cervical,supraclavicular,  or inguinal adenopathy Resp: clear to auscultation bilaterally and normal percussion bilaterally Cardio: regular rate and rhythm. No gallop. GI: soft, nontender, not distended, no mass or organomegaly. Normally active bowel sounds. Surgical incision not remarkable. Musculoskeletal/ Extremities: without pitting edema, cords, tenderness. PSYCH: appropriate mood and affect, appears more relaxed today Neuro: no peripheral neuropathy. Otherwise nonfocal Skin without rash, ecchymosis, petechiae Portacath-without erythema or tenderness  Lab Results:  Results for orders placed or performed in visit on 06/12/15  CBC with Differential  Result Value Ref Range   WBC 3.8 (L) 3.9 - 10.3 10e3/uL   NEUT# 1.8 1.5 - 6.5 10e3/uL   HGB 11.5 (L) 11.6 - 15.9 g/dL    HCT 33.7 (L) 34.8 - 46.6 %   Platelets 238 145 - 400 10e3/uL   MCV 98.5 79.5 - 101.0 fL   MCH 33.5 25.1 - 34.0 pg   MCHC 34.0 31.5 - 36.0 g/dL   RBC 3.42 (L) 3.70 - 5.45 10e6/uL   RDW 15.4 (H) 11.2 - 14.5 %   lymph# 1.4 0.9 - 3.3 10e3/uL   MONO# 0.5 0.1 - 0.9 10e3/uL   Eosinophils Absolute 0.0 0.0 - 0.5 10e3/uL   Basophils Absolute 0.0 0.0 - 0.1 10e3/uL   NEUT% 47.3 38.4 - 76.8 %   LYMPH% 38.2 14.0 - 49.7 %   MONO% 13.8 0.0 - 14.0 %   EOS% 0.3 0.0 - 7.0 %   BASO% 0.4 0.0 - 2.0 %    CMET available after visit normal with exception of total protein 6.0 and EGFR calculated 89  Studies/Results:  No results found.  Medications: I have reviewed the patient's current medications. Increase miralax to bid, continue other bowel regimen. Continue Protonix. Continue Biotene. Note she has refused to stop estrogen  DISCUSSION All of interval history as above. Discussed genetics referral, which she has not had and which would be appropriate given the ovarian cancer; patient would like referral for genetics counseling.  Discussed daily   granix vs neulasta: even with multiple trips to office, she prefers granix. Patient agrees to IVF on day 2.  Discussed fact that avastin generally does not exacerbate any of the chemotherapy side effects. Discussed likelihood of occasional epistaxis, usually minimal, with avastin, and suggested having afrin nose spray available to use only if significant epistaxis.  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 Gradual increase in CA125 March and April 2016 and PET evidence of involvement pelvis and abdomen, asymptomatic. Resumed chemotherapy with carboplatin and taxol using q 3 week dosing, cycle 1 on 05-01-15. Addition of avastin with cycle 2 on 05-26-15. IVF day 2 and granix x  5 beginnig day 2.  We will repeat scans prior to Dr Josephina Shih  seeing her early August, to be sure disease is stable or improving on this treatment.Referral for genetics counseling. 2.PAC placement by IR 04-21-15 : delayed start of avastin until cycle 2 3.left knee replacement 12-2014, doing well 4.HTN, elevated lipids, paroxysmal Afib: followed by Dr Ellyn Hack of cardiology. With increased palpitations will ask him to see again. Note on plavix and ASA if platelets drop with chemo.  5.chronic constipation requiring increased laxatives with adjuvant chemo, up to date on colonoscopy. Recommended increasing miralax to bid now. 6.hypothyroid on replacement 7.right hip replacement 2013 also for osteoarthritis 8. Long estrogen replacement, which she still uses. She has previously refused to discontinue the estrogen. Initial surgical path from 02-13-13 does not report ER PR testing. 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 Breast tissue heterogeneously dense by last tomo mammograms at [facility] 03-21-15, those mammograms otherwise negative.  10.. Mouth symptoms resolved: now using  Biotene regularly 11. GERD: no significant improvement yet on daily protonix for past week, continue.   All questions answered fully to their satisfaction. Chemo and granix orders confirmed. CT orders placed. I will see her back on 7-25 with counts. Cc Dr Virgina Jock. Appreciate Dr ClarkePearson's involvement and Dr Ellyn Hack seeing her upcoming. TIme spent 30 min including >50% counseling and coordination of care.   Lauris Serviss P, MD   06/12/2015, 11:29 AM

## 2015-06-12 NOTE — Telephone Encounter (Signed)
Gave patient avs report and appointments for July and August. [facility] will call re ct appointment - patient aware. Checked with LL and patient will keep labs for 8/5 and 8/8 and f/u for both 8/8 and 8/11. Patient aware.

## 2015-06-13 NOTE — Telephone Encounter (Signed)
OK - was thinking that antiarrythmics would be hard.  Candor

## 2015-06-13 NOTE — Telephone Encounter (Signed)
Can this encounter be closed?

## 2015-06-13 NOTE — Telephone Encounter (Signed)
Reviewed chemo regimen in Epic.  Would not want to use rythmol because patient using zofran, both will prolong QT.   Could be the dexamethasone causing her to induce her atrial fibrillation.  Taxol lists as a side effect "EKG changes", but doesn't explain what that means.   Would probably be best with extra dose of BB as long as her HR can support that.

## 2015-06-15 ENCOUNTER — Other Ambulatory Visit: Payer: Self-pay | Admitting: Oncology

## 2015-06-15 DIAGNOSIS — C569 Malignant neoplasm of unspecified ovary: Secondary | ICD-10-CM

## 2015-06-15 DIAGNOSIS — C7989 Secondary malignant neoplasm of other specified sites: Secondary | ICD-10-CM

## 2015-06-16 ENCOUNTER — Ambulatory Visit (HOSPITAL_BASED_OUTPATIENT_CLINIC_OR_DEPARTMENT_OTHER): Payer: Medicare Other

## 2015-06-16 ENCOUNTER — Ambulatory Visit: Payer: Medicare Other

## 2015-06-16 ENCOUNTER — Other Ambulatory Visit (HOSPITAL_BASED_OUTPATIENT_CLINIC_OR_DEPARTMENT_OTHER): Payer: Medicare Other

## 2015-06-16 VITALS — BP 175/79 | HR 64 | Temp 98.6°F | Resp 20

## 2015-06-16 DIAGNOSIS — Z95828 Presence of other vascular implants and grafts: Secondary | ICD-10-CM

## 2015-06-16 DIAGNOSIS — C569 Malignant neoplasm of unspecified ovary: Secondary | ICD-10-CM

## 2015-06-16 DIAGNOSIS — C561 Malignant neoplasm of right ovary: Secondary | ICD-10-CM

## 2015-06-16 DIAGNOSIS — Z5112 Encounter for antineoplastic immunotherapy: Secondary | ICD-10-CM | POA: Diagnosis not present

## 2015-06-16 DIAGNOSIS — C562 Malignant neoplasm of left ovary: Secondary | ICD-10-CM

## 2015-06-16 DIAGNOSIS — Z5111 Encounter for antineoplastic chemotherapy: Secondary | ICD-10-CM

## 2015-06-16 DIAGNOSIS — C7989 Secondary malignant neoplasm of other specified sites: Secondary | ICD-10-CM

## 2015-06-16 LAB — CBC WITH DIFFERENTIAL/PLATELET
BASO%: 0.6 % (ref 0.0–2.0)
BASOS ABS: 0 10*3/uL (ref 0.0–0.1)
EOS ABS: 0 10*3/uL (ref 0.0–0.5)
EOS%: 0 % (ref 0.0–7.0)
HCT: 36 % (ref 34.8–46.6)
HGB: 12.3 g/dL (ref 11.6–15.9)
LYMPH#: 0.4 10*3/uL — AB (ref 0.9–3.3)
LYMPH%: 9.1 % — ABNORMAL LOW (ref 14.0–49.7)
MCH: 33.8 pg (ref 25.1–34.0)
MCHC: 34.2 g/dL (ref 31.5–36.0)
MCV: 98.8 fL (ref 79.5–101.0)
MONO#: 0 10*3/uL — ABNORMAL LOW (ref 0.1–0.9)
MONO%: 0.5 % (ref 0.0–14.0)
NEUT#: 4 10*3/uL (ref 1.5–6.5)
NEUT%: 89.8 % — ABNORMAL HIGH (ref 38.4–76.8)
Platelets: 235 10*3/uL (ref 145–400)
RBC: 3.64 10*6/uL — ABNORMAL LOW (ref 3.70–5.45)
RDW: 15.6 % — AB (ref 11.2–14.5)
WBC: 4.5 10*3/uL (ref 3.9–10.3)

## 2015-06-16 LAB — COMPREHENSIVE METABOLIC PANEL (CC13)
ALT: 11 U/L (ref 0–55)
AST: 13 U/L (ref 5–34)
Albumin: 4 g/dL (ref 3.5–5.0)
Alkaline Phosphatase: 78 U/L (ref 40–150)
Anion Gap: 11 mEq/L (ref 3–11)
BUN: 12.5 mg/dL (ref 7.0–26.0)
CHLORIDE: 102 meq/L (ref 98–109)
CO2: 23 meq/L (ref 22–29)
Calcium: 9.4 mg/dL (ref 8.4–10.4)
Creatinine: 0.7 mg/dL (ref 0.6–1.1)
EGFR: 85 mL/min/{1.73_m2} — AB (ref >90)
GLUCOSE: 219 mg/dL — AB (ref 70–140)
Potassium: 3.9 mEq/L (ref 3.5–5.1)
Sodium: 136 mEq/L (ref 136–145)
Total Bilirubin: 0.59 mg/dL (ref 0.20–1.20)
Total Protein: 6.6 g/dL (ref 6.4–8.3)

## 2015-06-16 LAB — UA PROTEIN, DIPSTICK - CHCC

## 2015-06-16 MED ORDER — DIPHENHYDRAMINE HCL 50 MG/ML IJ SOLN
INTRAMUSCULAR | Status: AC
  Filled 2015-06-16: qty 1

## 2015-06-16 MED ORDER — LORAZEPAM 1 MG PO TABS
ORAL_TABLET | ORAL | Status: AC
  Filled 2015-06-16: qty 1

## 2015-06-16 MED ORDER — SODIUM CHLORIDE 0.9 % IV SOLN
Freq: Once | INTRAVENOUS | Status: AC
  Administered 2015-06-16: 11:00:00 via INTRAVENOUS

## 2015-06-16 MED ORDER — LORAZEPAM 1 MG PO TABS
0.5000 mg | ORAL_TABLET | Freq: Once | ORAL | Status: AC
  Administered 2015-06-16: 0.5 mg via ORAL

## 2015-06-16 MED ORDER — DEXTROSE 5 % IV SOLN
135.0000 mg/m2 | Freq: Once | INTRAVENOUS | Status: AC
  Administered 2015-06-16: 222 mg via INTRAVENOUS
  Filled 2015-06-16: qty 37

## 2015-06-16 MED ORDER — SODIUM CHLORIDE 0.9 % IJ SOLN
10.0000 mL | INTRAMUSCULAR | Status: DC | PRN
  Filled 2015-06-16: qty 10

## 2015-06-16 MED ORDER — DIPHENHYDRAMINE HCL 50 MG/ML IJ SOLN
50.0000 mg | Freq: Once | INTRAMUSCULAR | Status: AC
  Administered 2015-06-16: 50 mg via INTRAVENOUS

## 2015-06-16 MED ORDER — FAMOTIDINE IN NACL 20-0.9 MG/50ML-% IV SOLN
INTRAVENOUS | Status: AC
  Filled 2015-06-16: qty 50

## 2015-06-16 MED ORDER — SODIUM CHLORIDE 0.9 % IV SOLN
15.0000 mg/kg | Freq: Once | INTRAVENOUS | Status: AC
  Administered 2015-06-16: 900 mg via INTRAVENOUS
  Filled 2015-06-16: qty 36

## 2015-06-16 MED ORDER — SODIUM CHLORIDE 0.9 % IV SOLN
Freq: Once | INTRAVENOUS | Status: AC
  Administered 2015-06-16: 10:00:00 via INTRAVENOUS
  Filled 2015-06-16: qty 8

## 2015-06-16 MED ORDER — FAMOTIDINE IN NACL 20-0.9 MG/50ML-% IV SOLN
20.0000 mg | Freq: Once | INTRAVENOUS | Status: AC
  Administered 2015-06-16: 20 mg via INTRAVENOUS

## 2015-06-16 MED ORDER — CARBOPLATIN CHEMO INTRADERMAL TEST DOSE 100MCG/0.02ML
100.0000 ug | Freq: Once | INTRADERMAL | Status: AC
  Administered 2015-06-16: 100 ug via INTRADERMAL
  Filled 2015-06-16: qty 0.01

## 2015-06-16 MED ORDER — HEPARIN SOD (PORK) LOCK FLUSH 100 UNIT/ML IV SOLN
500.0000 [IU] | Freq: Once | INTRAVENOUS | Status: DC | PRN
  Filled 2015-06-16: qty 5

## 2015-06-16 MED ORDER — HEPARIN SOD (PORK) LOCK FLUSH 100 UNIT/ML IV SOLN
500.0000 [IU] | Freq: Once | INTRAVENOUS | Status: DC
  Filled 2015-06-16: qty 5

## 2015-06-16 MED ORDER — CARBOPLATIN CHEMO INJECTION 450 MG/45ML
295.6000 mg | Freq: Once | INTRAVENOUS | Status: AC
  Administered 2015-06-16: 300 mg via INTRAVENOUS
  Filled 2015-06-16: qty 30

## 2015-06-16 MED ORDER — SODIUM CHLORIDE 0.9 % IJ SOLN
10.0000 mL | INTRAMUSCULAR | Status: DC | PRN
  Administered 2015-06-16: 10 mL
  Filled 2015-06-16: qty 10

## 2015-06-16 MED ORDER — HEPARIN SOD (PORK) LOCK FLUSH 100 UNIT/ML IV SOLN
500.0000 [IU] | Freq: Once | INTRAVENOUS | Status: AC | PRN
  Administered 2015-06-16: 500 [IU]
  Filled 2015-06-16: qty 5

## 2015-06-16 MED ORDER — SODIUM CHLORIDE 0.9 % IJ SOLN
10.0000 mL | INTRAMUSCULAR | Status: DC | PRN
  Administered 2015-06-16: 10 mL via INTRAVENOUS
  Filled 2015-06-16: qty 10

## 2015-06-16 NOTE — Patient Instructions (Signed)
[facility] Discharge Instructions for Patients Receiving Chemotherapy  Today you received the following chemotherapy agents:  Taxol, Carboplatin To help prevent nausea and vomiting after your treatment, we encourage you to take your nausea medication.   If you develop nausea and vomiting that is not controlled by your nausea medication, call the clinic.   BELOW ARE SYMPTOMS THAT SHOULD BE REPORTED IMMEDIATELY:  *FEVER GREATER THAN 100.5 F  *CHILLS WITH OR WITHOUT FEVER  NAUSEA AND VOMITING THAT IS NOT CONTROLLED WITH YOUR NAUSEA MEDICATION  *UNUSUAL SHORTNESS OF BREATH  *UNUSUAL BRUISING OR BLEEDING  TENDERNESS IN MOUTH AND THROAT WITH OR WITHOUT PRESENCE OF ULCERS  *URINARY PROBLEMS  *BOWEL PROBLEMS  UNUSUAL RASH Items with * indicate a potential emergency and should be followed up as soon as possible.  Feel free to call the clinic you have any questions or concerns. The clinic phone number is [phone].  Please show the CHEMO ALERT CARD at check-in to the Emergency Department and triage nurse.   

## 2015-06-16 NOTE — Progress Notes (Signed)
Pt returned to clinic after d/c w/ daughter reporting back of pt's neck and front of neck red and flushed.  Pt denies itching or any respiratory distress.  No hives or rash noted.  No other symptoms than the flushing.  VSS.  Pt states she will take benadryl if any itching.  Instructed pt to call if she starts itching or has any hives.  Instructed her to go to ED if any respiratory distress.  Pt verbalized understanding.

## 2015-06-16 NOTE — Progress Notes (Signed)
Pt reported feeling R arm was "jumpy". Described symptoms as similar to restless leg feeling r/t diphenhydramine. Per Selena Lesser, NP- gave pt lorazepam .5mg  po. Pt reported relief of symptoms 15 minutes after administration of lorazepam.

## 2015-06-16 NOTE — Patient Instructions (Signed)

## 2015-06-17 ENCOUNTER — Ambulatory Visit (HOSPITAL_BASED_OUTPATIENT_CLINIC_OR_DEPARTMENT_OTHER): Payer: Medicare Other

## 2015-06-17 ENCOUNTER — Ambulatory Visit: Payer: Medicare Other

## 2015-06-17 VITALS — BP 153/67 | HR 60 | Temp 98.1°F | Resp 18

## 2015-06-17 DIAGNOSIS — C569 Malignant neoplasm of unspecified ovary: Secondary | ICD-10-CM

## 2015-06-17 DIAGNOSIS — C561 Malignant neoplasm of right ovary: Secondary | ICD-10-CM

## 2015-06-17 DIAGNOSIS — C562 Malignant neoplasm of left ovary: Secondary | ICD-10-CM

## 2015-06-17 DIAGNOSIS — R971 Elevated cancer antigen 125 [CA 125]: Secondary | ICD-10-CM

## 2015-06-17 DIAGNOSIS — Z5189 Encounter for other specified aftercare: Secondary | ICD-10-CM | POA: Diagnosis not present

## 2015-06-17 DIAGNOSIS — C7989 Secondary malignant neoplasm of other specified sites: Secondary | ICD-10-CM

## 2015-06-17 MED ORDER — HEPARIN SOD (PORK) LOCK FLUSH 100 UNIT/ML IV SOLN
500.0000 [IU] | Freq: Once | INTRAVENOUS | Status: AC
Start: 2015-06-17 — End: 2015-06-17
  Administered 2015-06-17: 500 [IU] via INTRAVENOUS
  Filled 2015-06-17: qty 5

## 2015-06-17 MED ORDER — TBO-FILGRASTIM 300 MCG/0.5ML ~~LOC~~ SOSY
300.0000 ug | PREFILLED_SYRINGE | Freq: Once | SUBCUTANEOUS | Status: AC
  Administered 2015-06-17: 300 ug via SUBCUTANEOUS
  Filled 2015-06-17: qty 0.5

## 2015-06-17 MED ORDER — SODIUM CHLORIDE 0.9 % IJ SOLN
10.0000 mL | INTRAMUSCULAR | Status: DC | PRN
  Administered 2015-06-17: 10 mL via INTRAVENOUS
  Filled 2015-06-17: qty 10

## 2015-06-17 MED ORDER — SODIUM CHLORIDE 0.9 % IV SOLN
1000.0000 mL | INTRAVENOUS | Status: DC
  Administered 2015-06-17: 11:00:00 via INTRAVENOUS

## 2015-06-17 NOTE — Progress Notes (Signed)
Granix injection given by infusion nurse

## 2015-06-18 ENCOUNTER — Ambulatory Visit (HOSPITAL_BASED_OUTPATIENT_CLINIC_OR_DEPARTMENT_OTHER): Payer: Medicare Other

## 2015-06-18 VITALS — BP 144/67 | HR 63 | Temp 98.2°F

## 2015-06-18 DIAGNOSIS — Z5189 Encounter for other specified aftercare: Secondary | ICD-10-CM | POA: Diagnosis not present

## 2015-06-18 DIAGNOSIS — C562 Malignant neoplasm of left ovary: Secondary | ICD-10-CM

## 2015-06-18 DIAGNOSIS — R971 Elevated cancer antigen 125 [CA 125]: Secondary | ICD-10-CM

## 2015-06-18 DIAGNOSIS — C561 Malignant neoplasm of right ovary: Secondary | ICD-10-CM | POA: Diagnosis not present

## 2015-06-18 DIAGNOSIS — C569 Malignant neoplasm of unspecified ovary: Secondary | ICD-10-CM

## 2015-06-18 MED ORDER — TBO-FILGRASTIM 300 MCG/0.5ML ~~LOC~~ SOSY
300.0000 ug | PREFILLED_SYRINGE | Freq: Once | SUBCUTANEOUS | Status: AC
  Administered 2015-06-18: 300 ug via SUBCUTANEOUS
  Filled 2015-06-18: qty 0.5

## 2015-06-19 ENCOUNTER — Ambulatory Visit (HOSPITAL_BASED_OUTPATIENT_CLINIC_OR_DEPARTMENT_OTHER): Payer: Medicare Other

## 2015-06-19 VITALS — BP 152/73 | HR 70 | Temp 98.7°F

## 2015-06-19 DIAGNOSIS — C562 Malignant neoplasm of left ovary: Secondary | ICD-10-CM

## 2015-06-19 DIAGNOSIS — Z5189 Encounter for other specified aftercare: Secondary | ICD-10-CM | POA: Diagnosis not present

## 2015-06-19 DIAGNOSIS — R971 Elevated cancer antigen 125 [CA 125]: Secondary | ICD-10-CM

## 2015-06-19 DIAGNOSIS — C561 Malignant neoplasm of right ovary: Secondary | ICD-10-CM | POA: Diagnosis not present

## 2015-06-19 DIAGNOSIS — C569 Malignant neoplasm of unspecified ovary: Secondary | ICD-10-CM

## 2015-06-19 MED ORDER — TBO-FILGRASTIM 300 MCG/0.5ML ~~LOC~~ SOSY
300.0000 ug | PREFILLED_SYRINGE | Freq: Once | SUBCUTANEOUS | Status: AC
  Administered 2015-06-19: 300 ug via SUBCUTANEOUS
  Filled 2015-06-19: qty 0.5

## 2015-06-20 ENCOUNTER — Ambulatory Visit (HOSPITAL_BASED_OUTPATIENT_CLINIC_OR_DEPARTMENT_OTHER): Payer: Medicare Other

## 2015-06-20 VITALS — BP 154/77 | HR 72 | Temp 98.7°F

## 2015-06-20 DIAGNOSIS — Z5189 Encounter for other specified aftercare: Secondary | ICD-10-CM | POA: Diagnosis not present

## 2015-06-20 DIAGNOSIS — C561 Malignant neoplasm of right ovary: Secondary | ICD-10-CM

## 2015-06-20 DIAGNOSIS — C569 Malignant neoplasm of unspecified ovary: Secondary | ICD-10-CM

## 2015-06-20 DIAGNOSIS — C562 Malignant neoplasm of left ovary: Secondary | ICD-10-CM

## 2015-06-20 DIAGNOSIS — R971 Elevated cancer antigen 125 [CA 125]: Secondary | ICD-10-CM

## 2015-06-20 MED ORDER — TBO-FILGRASTIM 300 MCG/0.5ML ~~LOC~~ SOSY
300.0000 ug | PREFILLED_SYRINGE | Freq: Once | SUBCUTANEOUS | Status: AC
  Administered 2015-06-20: 300 ug via SUBCUTANEOUS
  Filled 2015-06-20: qty 0.5

## 2015-06-21 ENCOUNTER — Ambulatory Visit (HOSPITAL_BASED_OUTPATIENT_CLINIC_OR_DEPARTMENT_OTHER): Payer: Medicare Other

## 2015-06-21 VITALS — BP 157/71 | HR 66 | Temp 98.1°F | Resp 16

## 2015-06-21 DIAGNOSIS — Z5189 Encounter for other specified aftercare: Secondary | ICD-10-CM | POA: Diagnosis not present

## 2015-06-21 DIAGNOSIS — C562 Malignant neoplasm of left ovary: Secondary | ICD-10-CM | POA: Diagnosis not present

## 2015-06-21 DIAGNOSIS — C569 Malignant neoplasm of unspecified ovary: Secondary | ICD-10-CM

## 2015-06-21 DIAGNOSIS — R971 Elevated cancer antigen 125 [CA 125]: Secondary | ICD-10-CM

## 2015-06-21 DIAGNOSIS — C561 Malignant neoplasm of right ovary: Secondary | ICD-10-CM | POA: Diagnosis not present

## 2015-06-21 MED ORDER — TBO-FILGRASTIM 300 MCG/0.5ML ~~LOC~~ SOSY
300.0000 ug | PREFILLED_SYRINGE | Freq: Once | SUBCUTANEOUS | Status: AC
  Administered 2015-06-21: 300 ug via SUBCUTANEOUS

## 2015-06-23 ENCOUNTER — Ambulatory Visit (INDEPENDENT_AMBULATORY_CARE_PROVIDER_SITE_OTHER): Payer: Medicare Other | Admitting: Cardiology

## 2015-06-23 ENCOUNTER — Encounter: Payer: Self-pay | Admitting: Oncology

## 2015-06-23 ENCOUNTER — Other Ambulatory Visit (HOSPITAL_BASED_OUTPATIENT_CLINIC_OR_DEPARTMENT_OTHER): Payer: Medicare Other

## 2015-06-23 ENCOUNTER — Ambulatory Visit (HOSPITAL_BASED_OUTPATIENT_CLINIC_OR_DEPARTMENT_OTHER): Payer: Medicare Other | Admitting: Oncology

## 2015-06-23 ENCOUNTER — Telehealth: Payer: Self-pay | Admitting: Oncology

## 2015-06-23 ENCOUNTER — Encounter: Payer: Self-pay | Admitting: Cardiology

## 2015-06-23 VITALS — BP 156/75 | HR 70 | Temp 97.5°F | Resp 18 | Ht 63.0 in | Wt 131.7 lb

## 2015-06-23 VITALS — BP 170/96 | HR 64 | Ht 63.0 in | Wt 130.2 lb

## 2015-06-23 DIAGNOSIS — C561 Malignant neoplasm of right ovary: Secondary | ICD-10-CM | POA: Diagnosis not present

## 2015-06-23 DIAGNOSIS — C7989 Secondary malignant neoplasm of other specified sites: Secondary | ICD-10-CM | POA: Diagnosis not present

## 2015-06-23 DIAGNOSIS — C562 Malignant neoplasm of left ovary: Secondary | ICD-10-CM

## 2015-06-23 DIAGNOSIS — I10 Essential (primary) hypertension: Secondary | ICD-10-CM | POA: Diagnosis not present

## 2015-06-23 DIAGNOSIS — T451X5A Adverse effect of antineoplastic and immunosuppressive drugs, initial encounter: Secondary | ICD-10-CM

## 2015-06-23 DIAGNOSIS — Z8679 Personal history of other diseases of the circulatory system: Secondary | ICD-10-CM | POA: Diagnosis not present

## 2015-06-23 DIAGNOSIS — C569 Malignant neoplasm of unspecified ovary: Secondary | ICD-10-CM

## 2015-06-23 DIAGNOSIS — K5900 Constipation, unspecified: Secondary | ICD-10-CM | POA: Diagnosis not present

## 2015-06-23 DIAGNOSIS — Z95828 Presence of other vascular implants and grafts: Secondary | ICD-10-CM

## 2015-06-23 DIAGNOSIS — D701 Agranulocytosis secondary to cancer chemotherapy: Secondary | ICD-10-CM

## 2015-06-23 DIAGNOSIS — E785 Hyperlipidemia, unspecified: Secondary | ICD-10-CM

## 2015-06-23 DIAGNOSIS — K219 Gastro-esophageal reflux disease without esophagitis: Secondary | ICD-10-CM

## 2015-06-23 DIAGNOSIS — E039 Hypothyroidism, unspecified: Secondary | ICD-10-CM

## 2015-06-23 DIAGNOSIS — D6959 Other secondary thrombocytopenia: Secondary | ICD-10-CM

## 2015-06-23 DIAGNOSIS — I480 Paroxysmal atrial fibrillation: Secondary | ICD-10-CM

## 2015-06-23 DIAGNOSIS — Z5111 Encounter for antineoplastic chemotherapy: Secondary | ICD-10-CM

## 2015-06-23 LAB — COMPREHENSIVE METABOLIC PANEL (CC13)
ALT: 16 U/L (ref 0–55)
ANION GAP: 10 meq/L (ref 3–11)
AST: 21 U/L (ref 5–34)
Albumin: 3.6 g/dL (ref 3.5–5.0)
Alkaline Phosphatase: 79 U/L (ref 40–150)
BUN: 10.1 mg/dL (ref 7.0–26.0)
CO2: 27 mEq/L (ref 22–29)
Calcium: 9 mg/dL (ref 8.4–10.4)
Chloride: 103 mEq/L (ref 98–109)
Creatinine: 0.7 mg/dL (ref 0.6–1.1)
EGFR: 88 mL/min/{1.73_m2} — AB (ref >90)
Glucose: 122 mg/dl (ref 70–140)
POTASSIUM: 3.8 meq/L (ref 3.5–5.1)
Sodium: 140 mEq/L (ref 136–145)
TOTAL PROTEIN: 6 g/dL — AB (ref 6.4–8.3)
Total Bilirubin: 0.41 mg/dL (ref 0.20–1.20)

## 2015-06-23 LAB — CBC WITH DIFFERENTIAL/PLATELET
BASO%: 1.1 % (ref 0.0–2.0)
Basophils Absolute: 0.1 10*3/uL (ref 0.0–0.1)
EOS%: 1.6 % (ref 0.0–7.0)
Eosinophils Absolute: 0.1 10*3/uL (ref 0.0–0.5)
HCT: 33.4 % — ABNORMAL LOW (ref 34.8–46.6)
HGB: 11.4 g/dL — ABNORMAL LOW (ref 11.6–15.9)
LYMPH#: 1.7 10*3/uL (ref 0.9–3.3)
LYMPH%: 34 % (ref 14.0–49.7)
MCH: 33.6 pg (ref 25.1–34.0)
MCHC: 34.1 g/dL (ref 31.5–36.0)
MCV: 98.7 fL (ref 79.5–101.0)
MONO#: 0.5 10*3/uL (ref 0.1–0.9)
MONO%: 10.5 % (ref 0.0–14.0)
NEUT%: 52.8 % (ref 38.4–76.8)
NEUTROS ABS: 2.7 10*3/uL (ref 1.5–6.5)
Platelets: 71 10*3/uL — ABNORMAL LOW (ref 145–400)
RBC: 3.38 10*6/uL — ABNORMAL LOW (ref 3.70–5.45)
RDW: 15.3 % — ABNORMAL HIGH (ref 11.2–14.5)
WBC: 5.1 10*3/uL (ref 3.9–10.3)

## 2015-06-23 MED ORDER — LOSARTAN POTASSIUM 50 MG PO TABS: 50.0000 mg | ORAL_TABLET | Freq: Every day | ORAL | Status: DC

## 2015-06-23 NOTE — Telephone Encounter (Signed)
Appointments made and avs printed for patient,inbox to dr Marko Plume to check on chemos and ivf appopintments

## 2015-06-23 NOTE — Patient Instructions (Addendum)
CONTINUE WITH CURRENT MEDICATION EXCEPT ADDING LOSARTAN 50 MG--- FOR THE FIRST WEEK (7 DAYS) TAKE 1/2 TABLET OF 50 MG LOSARTAN THEN GO TO 50 MG DAILY      - CHECK BLOOD PRESSURE  TARGET  NUMBER TOP NUMBER SHOULD BE LESS THAN 150  TARGET BLOOD PRESSURE -140-150/80-90.  Your physician wants you to follow-up in 6 months Dr Ellyn Hack.  You will receive a reminder letter in the mail two months in advance. If you don't receive a letter, please call our office to schedule the follow-up appointment.

## 2015-06-23 NOTE — Progress Notes (Signed)
OFFICE PROGRESS NOTE   June 23, 2015   Physicians:D. 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 She had cycle 3 chemo and cycle 2 avastin on 06-16-15, with IVF on day 2 and granix days 2-5. She is for CT AP next week, prior to Dr Tamela Oddi follow up on 07-04-15.   Patient is feeling better today, but has a difficult time tolerating fatigue, nausea and aches after treatments. She had nausea for several days, with IVF day 2 and antiemetics helpful and was able to keep down some po's including liquids. Aches were generalized and particularly bad on days 6 and 7 (so likely from granix) despite claritin. She took tylenol if temp up to 99, and with this did not have the higher fevers that she had after each cycles 1 and 2. She has had no bleeding. 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 She has taken laxatives depending on what bowels did, but had BMs x 3 "explosive" yesterday. She did not need antiemetics yesterday. She has been sleeping a lot (no ativan), too drowsy the first week even to read.  Unfortunately, her 72 yo daughter in law died unexpectedly also last week, possibly cardiac + intracerebral bleed. Patient felt too badly to go to her son's home in [place], and does not feel able to attend the funeral today.   PAC placed by IR 04-21-15 No genetics counseling located in this EMR. Message to genetics counselors to confirm.  CA 125 on 05-01-15 33    ONCOLOGIC HISTORY Patient presented to Dr Abbie Sons in late Jan 2014 after bright red spotting that AM following 2 weeks of spotting in early Dec 2015. with uterus larger than had been apparent on exam in Oct 2013.  Sonohystogram 01-05-13 showed uterus normal size and echotexture, endometrium 4.3 mm, left ovary normal and right adnexa with 1.1 x 8.4.x8.2 cm cystic and solid mass. Endometrial biopsy benign and CA 125 also on 01-05-13 was 178.8. She had CT AP 01-17-13 with 1.0 x 6.9 x 8.9 cm complex right ovarian mass, no ascites, small retroperitoneal nodes. She was seen by Dr Skeet Latch on 01-18-13 and taken to surgery by Dr Josephina Shih on 02-13-13, which was TAH/BSO/ omentectomy/ureterolysis/ resection of cul-de-sac tumor/right pelvic lymphadnectomy and resection of rectum with reanastomosis. At completion of surgery there was no gross residual disease. 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 [facility] [phone]) had rare clusters of atypical cells. Chemotherapy with dose dense taxol carboplatin was begun day 1 cycle 1 on 03-20-13; ANC was 1.1 on day 15 cycle 1 with taxol given and neupogen added 04-04-13. She had day 1 cycle 2 treatment on 04-17-13, then was briefly hospitalized 5-22 to 04-20-13 after syncopal episode, with antihypertensive agents DCd and UTI treated. She was feeling much better at time of "day 8" cycle 2 on 05-01-13 and did have neupogen 300 mcg x 1 dose on 05-02-13. She was readmitted to hospital 6-5 thru 05-05-13 with fever, empirically on antibiotics and blood cultures negative. Cycle 6 completed 08-14-13. CT AP 09-25-13 had no evidence of  cancer and CA 125 was 18.3 on 07-24-2013. Marker was 20 in early Dec 2015, 26 on 01-31-15 and 35 on 03-14-15. PET 04-03-15 documented disease bilateral pelvic sidewalls, vaginal cuff and scattered areas thru abdomen/ pelvis, as well as area of uptake in spleen. She resumed carboplatin taxol using q 3 week regimen on 05-01-15. She was  neutropenic with ANC 0.3 on day 9 despite beginning granix on day 5.Avastin added with cycle 2 chemo on 05-26-15.   Review of systems as above, also: No bleeding. No increased SOB. No LE swelling. No bladder symptoms. Aches were particularly in sternum and chest. She hates having lost hair. No palpitations since medications increased by cardiology, to see Dr Ellyn Hack later today. Remainder of 10 point Review of Systems negative.  Objective:  Vital signs in last 24 hours:  BP 156/75 mmHg  Pulse 70  Temp(Src) 97.5 F (36.4 C) (Oral)  Resp 18  Ht 5' 3"  (1.6 m)  Wt 131 lb 11.2 oz (59.739 kg)  BMI 23.34 kg/m2  SpO2 99% Weight is stable Alert, oriented and appropriate. Does not appear uncomfortable. Respirations not labored RA. Ambulatory without assistance.  Alopecia, very attractive wig.   HEENT:PERRL, sclerae not icteric. Oral mucosa moist without lesions, posterior pharynx clear.  Neck supple. No JVD.  Lymphatics:no cervical,supraclavicular, axillary or inguinal adenopathy Resp: clear to auscultation bilaterally and normal percussion bilaterally Cardio: regular rate and rhythm. No gallop. GI: soft, nontender, not distended, no mass or organomegaly. Normally active bowel sounds. Surgical incision not remarkable. Musculoskeletal/ Extremities: without pitting edema, cords, tenderness. Decreased muscle mass thruout Neuro: no significant peripheral neuropathy. Otherwise nonfocal. PSYCH good eye contact, easily conversant Skin without rash, ecchymosis, petechiae Portacath-without erythema or tenderness  Lab Results:  Results for orders placed or performed in visit on 06/23/15  CBC with Differential  Result Value Ref Range   WBC 5.1 3.9 - 10.3 10e3/uL   NEUT# 2.7 1.5 - 6.5 10e3/uL   HGB 11.4 (L) 11.6 - 15.9 g/dL   HCT 33.4 (L) 34.8 - 46.6 %   Platelets 71 (L) 145 - 400 10e3/uL   MCV 98.7 79.5 - 101.0 fL   MCH 33.6 25.1 - 34.0 pg   MCHC 34.1 31.5 - 36.0 g/dL   RBC 3.38 (L) 3.70 -  5.45 10e6/uL   RDW 15.3 (H) 11.2 - 14.5 %   lymph# 1.7 0.9 - 3.3 10e3/uL   MONO# 0.5 0.1 - 0.9 10e3/uL   Eosinophils Absolute 0.1 0.0 - 0.5 10e3/uL   Basophils Absolute 0.1 0.0 - 0.1 10e3/uL   NEUT% 52.8 38.4 - 76.8 %   LYMPH% 34.0 14.0 - 49.7 %   MONO% 10.5 0.0 - 14.0 %   EOS% 1.6 0.0 - 7.0 %   BASO% 1.1 0.0 - 2.0 %  Comprehensive metabolic panel (Cmet) - [facility]  Result Value Ref Range   Sodium 140 136 - 145 mEq/L   Potassium 3.8 3.5 - 5.1 mEq/L   Chloride 103 98 - 109 mEq/L   CO2 27 22 - 29 mEq/L   Glucose 122 70 - 140 mg/dl   BUN 10.1 7.0 - 26.0 mg/dL   Creatinine 0.7 0.6 - 1.1 mg/dL   Total Bilirubin 0.41 0.20 - 1.20 mg/dL   Alkaline Phosphatase 79 40 - 150 U/L   AST 21 5 - 34 U/L   ALT 16 0 - 55 U/L   Total Protein 6.0 (L) 6.4 - 8.3 g/dL   Albumin 3.6 3.5 - 5.0 g/dL   Calcium 9.0  8.4 - 10.4 mg/dL   Anion Gap 10 3 - 11 mEq/L   EGFR 88 (L) >90 ml/min/1.73 m2    CA 125 from 06-05-15 down to 21, this having been 39 on 04-21-15  Studies/Results:  No results found.  Medications: I have reviewed the patient's current medications. Hold ASA and plavix starting today until CBC rechecked ~ 07-04-15, OK to resume then if plt >90k. If same regimen continued, will add decadron 4 mg q AM days 3-4-5 (will have IV decadron with IVF on day 2). Note she has not wanted to DC her long term estrogen  DISCUSSION Mentally/ emotionally she is still having a very difficult time with the recurrent cancer and with treatment side effects, tho she prefers q 3 week dosing to previous weekly dosing used with adjuvant therapy. She is having a difficult time accepting fact that we do not expect to cure this recurrent disease, tho hopefully she will again have a good response to treatment and be able to go back onto treatment break. She just does not like feeling badly and not feeling up to doing usual activities. She is aware of support groups, but times for those are not convenient for her. I have suggested  some counseling, which she does want; messages sent to CSWs and [facility] chaplain. She understands that frequently the carboplatin and taxol give best results for initial intervention in recurrent disease following long treatment-free interval, but certainly she can discuss treatment choice and quality of life concerns when she sees Dr Josephina Shih. I have suggested that if scan shows improvement, she may be more inclined to continue this therapy. She understands that we hope addition of avastin will also give benefit. Medications as noted Counts other than platelets holding well today.She prefers several days of granix to neulasta.   Message to RN: Repeat CBC 8-5. Resume ASA and plavix if plt >90k.  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 Gradual increase in CA125 March and April 2016 and PET evidence of involvement pelvis and abdomen, asymptomatic. Resumed chemotherapy with carboplatin and taxol using q 3 week dosing, cycle 1 on 05-01-15. Addition of avastin with cycle 2 on 05-26-15. IVF day 2 and granix x 5 beginnig day 2, and additional decadron days 3,4,5 with upcoming cycle. We will repeat scans prior to Dr Josephina Shih seeing her early August, to be sure disease is stable or improving on this treatment.Referral for genetics counseling. Referral for counseling with [facility] support staff. 2.PAC placement by IR 04-21-15 : delayed start of avastin until cycle 2 3.left knee replacement 12-2014, doing well 4.HTN, elevated lipids, paroxysmal Afib: followed by Dr Ellyn Hack of cardiology. With increased palpitations will ask him to see again. Note on plavix and ASA if platelets drop with chemo.  5.chronic constipation requiring increased laxatives with adjuvant chemo, up to  date on colonoscopy. Recommended increasing miralax to bid now. 6.hypothyroid on replacement 7.right hip replacement 2013 also for osteoarthritis 8. Long estrogen replacement, which she still uses. She has previously refused to discontinue the estrogen. Initial surgical path from 02-13-13 does not report ER PR testing. 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 Breast tissue heterogeneously dense by last tomo mammograms at [facility] 03-21-15, those mammograms otherwise negative.  10.. Mouth symptoms resolved: now using Biotene regularly 11. GERD: seems better with protonix  All questions answered.  I will see her day of treatment 07-07-15. Time spent 30 min including >50% counseling and coordination of care.     LIVESAY,LENNIS P, MD   06/23/2015, 9:57 AM

## 2015-06-23 NOTE — Telephone Encounter (Signed)
Dr Ellyn Hack discuss with patient at office appointment today.[name]

## 2015-06-23 NOTE — Progress Notes (Signed)
PCP: Precious Reel, MD  Clinic Note: Chief Complaint  Patient presents with  . Follow-up    review labs; no chest pain, no shortness of breath,no edema, no pain in legs, no cramping in legs, no lightheadedness, no dizziness  . Atrial Fibrillation    HPI: [name] is a 74 y.o. female with a PMH below who presents today for followup for paroxysmal atrial fibrillation.  [name] was last seen on 12/12/2014. This was in part for preop evaluation for left knee surgery that occurred on February 5. In March, she had repeat CA-125 testing that showed elevated levels, suggesting recurrence of her ovarian cancer. She subsequently was diagnosed with recurrence of metastatic cancer and has been on chemotherapy since. Her first arousal chemotherapy she noted that she was having significant bursts of A. Fib lasting most of the night for the first couple days and waned all throughout the rest week. We addressed this by increasing her beta blocker, recommended holding Rythmol for her chemotherapy. I was concerned with possible medication interactions (mostly with antiemetics).    With the increased dose of beta blocker, she said that for the most recent treatment course, she really did not have any significant A. Fib recurrence. She has had her for her Plavix held secondary to thrombocytopenia.  Studies Reviewed: no new studies  Interval History: [name] presents today with good spirits. She was definitely noticing the palpitations but since increasing the beta blocker dose has not had nearly as many spells of what she thinks is a recurrence of A. Fib. She has not had any TIA/amaurosis fugax or CVA type symptoms while having her episodes of A. Fib, but has noted some dizziness. She is not certain of any heart failure symptoms of PND, orthopnea or edema. Mild exertional dyspnea symptoms because of fatigue being on chemotherapy, but not really unexpected. She still does most of her activities  without any issues. She has never had any significant episodes of chest tightness pressure with rest or exertion. Overall she is become more stable from cardiac standpoint as her palpitations are improved.   The one thing she does note, is that now her blood pressures have started to increase significantly despite increased his beta blocker. She had previously been on several other cancers that were stopped in the past (2014) for hypotension in the setting of her cancer treatment.  Past Medical History  Diagnosis Date  . Hypertension   . Dyslipidemia   . Hypothyroidism   . Arthritis     OSTEOARTHRITIS   -- CONSTANT PAIN RIGHT HIP---AND PAIN LEFT KNEE--PT STATES SHE GETS INJECTIONS INTO HER KNEE  . Ovarian cancer   . PAF (paroxysmal atrial fibrillation)     Only on Plavix -- not full Anticoagulation per pt. request  . Complication of anesthesia     BLOOD PRESSURE DROPPED WITH NASAL SURGERY, ONE OF THE CARPAL TUNNEL REPAIRS AND DURING A COLONOSCOPY  . History of skin cancer   . Constipation     Past Surgical History  Procedure Laterality Date  . Bilateral carpal tunnel repair  2007  . Dilation and curettage of uterus  1969  . Surgery for ruptured ovarian cyst  1969  . Rhinoplasty for fractured nose  1986  . Left knee arthroscopy   2011  . Total hip arthroplasty  02/25/2012    Procedure: TOTAL HIP ARTHROPLASTY ANTERIOR APPROACH;  Surgeon: Mcarthur Rossetti, MD;  Location: [facility];  Service: Orthopedics;  Laterality: Right;  . Tonsillectomy  1962  . Laparotomy Bilateral 02/13/2013    Procedure: EXPLORATORY LAPAROTOMY TOTAL ABDOMINAL HYSTERECTOMY BILATERAL SALPINGO-OOPHORECTOMY, Partial Rectal Resection with Reanastamosis;  Surgeon: Alvino Chapel, MD;  Location: [facility];  Service: Gynecology;  Laterality: Bilateral;  . Omentectomy  02/13/2013    Procedure: OMENTECTOMY;  Surgeon: Alvino Chapel, MD;  Location: [facility];  Service: Gynecology;;  . Lymphadenectomy Right  02/13/2013    Procedure: PEVLIC  LYMPHADENECTOMY, DEBULKING right pelvic tumor nodules;  Surgeon: Alvino Chapel, MD;  Location: [facility];  Service: Gynecology;  Laterality: Right;  . Total abdominal hysterectomy  01/2013  . Transthoracic echocardiogram  May 2014    Normal LV size function. EF 60-65%. Grade 1 diastolic function. Mild MR and mildly elevated PA pressures of 37 mmHg per  . Nm myoview ltd  June 2010     subbmaximal with no ischemia or infarction.  . Total knee arthroplasty Left 01/03/2015    Procedure: LEFT TOTAL KNEE ARTHROPLASTY;  Surgeon: Mcarthur Rossetti, MD;  Location: [facility];  Service: Orthopedics;  Laterality: Left;    ROS: A comprehensive was performed. Review of Systems  Constitutional: Negative for weight loss and malaise/fatigue (Actually has pretty good energy).  Eyes: Negative for blurred vision.  Respiratory: Negative for cough and shortness of breath.   Cardiovascular: Negative for claudication.  Gastrointestinal: Positive for nausea (With chemotherapy) and vomiting (None so much anymore with her chemotherapy). Negative for blood in stool and melena.  Genitourinary: Positive for hematuria.  Musculoskeletal: Positive for joint pain (knee pain less prominent).  Neurological: Positive for dizziness (Sometimes positional, usually following chemotherapy and when tachycardic). Negative for headaches.  Endo/Heme/Allergies: Bruises/bleeds easily.  Psychiatric/Behavioral: Negative for depression.       In remarkably good spirits, remaining hopeful in the setting of recurrent cancer  All other systems reviewed and are negative.   Current Outpatient Prescriptions on File Prior to Visit  Medication Sig Dispense Refill  . amoxicillin (AMOXIL) 500 MG capsule Piror to dental procedures    . aspirin EC 81 MG tablet Take 81 mg by mouth every morning.     . bisacodyl (DULCOLAX) 5 MG EC tablet Take 5 mg by mouth daily as needed for moderate constipation.    .  clopidogrel (PLAVIX) 75 MG tablet TAKE 1 TABLET BY MOUTH AT BEDTIME (Patient taking differently: Take 1 tablet by mouth at bedtime.) 90 tablet 3  . desonide (DESOWEN) 0.05 % cream Apply 1 application topically 2 (two) times daily as needed (rash).   1  . dexamethasone (DECADRON) 4 MG tablet Take 5 tabs with food (=20mg ) 12 hrs and 6 hrs prior to chemotherapy. 20 tablet 1  . docusate sodium (COLACE) 100 MG capsule Take 100 mg by mouth 2 (two) times daily as needed for mild constipation.     Marland Kitchen estradiol (ESTRACE) 1 MG tablet Take 1 tablet (1 mg total) by mouth every morning. PT STATES SHE WANTS ESTRACE--NOT THE GENERIC 90 tablet 3  . Glucosamine HCl (GLUCOSAMINE PO) Take 1,000 mg by mouth every morning.     Marland Kitchen levothyroxine (SYNTHROID, LEVOTHROID) 88 MCG tablet Take 88 mcg by mouth every morning. Takes on an empty stomach. PT STATES SHE NEEDS THE SYNTHROID--DOES NOT WANT THE GENERIC    . lidocaine-prilocaine (EMLA) cream Apply to Porta-Cath site 1-2 hours prior to access as directed. 30 g 2  . loratadine (CLARITIN) 10 MG tablet Take 10 mg by mouth daily as needed for allergies.    Marland Kitchen LORazepam (ATIVAN) 1 MG tablet  Place 1/2 -1 tablet under the tongue or swallow every 6 hrs as needed for nausea. Will make drowsy. 20 tablet 0  . Menthol, Topical Analgesic, (ICY HOT EX) Apply 1 application topically daily as needed (pain.).    Marland Kitchen Multiple Vitamin (MULTIVITAMIN WITH MINERALS) TABS tablet Take 1 tablet by mouth every morning.     . ondansetron (ZOFRAN) 8 MG tablet Take 1 tablet (8 mg total) by mouth every 8 (eight) hours as needed for nausea or vomiting (Will not make drowsy). 30 tablet 1  . pantoprazole (PROTONIX) 40 MG tablet Take 1 tablet (40 mg total) by mouth daily. For GERD 30 tablet 3  . Polyethyl Glycol-Propyl Glycol (SYSTANE OP) Place 1 drop into both eyes 2 (two) times daily.     . polyethylene glycol (MIRALAX / GLYCOLAX) packet Take 17 g by mouth every morning.     . potassium chloride (K-DUR) 10  MEQ tablet Take 10 mEq by mouth every Monday, Wednesday, and Friday.    . propafenone (RYTHMOL) 225 MG tablet Take 1 tablet (225 mg total) by mouth 2 (two) times daily. 180 tablet 3  . Wheat Dextrin (BENEFIBER) POWD Take 2 scoop by mouth every morning.      No current facility-administered medications on file prior to visit.   Allergies  Allergen Reactions  . Ceftin [Cefuroxime Axetil]     Interferes with propafenone.    . Codeine Other (See Comments)    Does not like the feeling she gets  . Flexeril [Cyclobenzaprine] Other (See Comments)    Pt states "increased heart rate"  . Levaquin [Levofloxacin In D5w]     Interferes with propafenone.   . Lisinopril     LIP NUMBNESS  . Tegaderm Ag Mesh [Silver]   . Tape Rash    TEGADERM.   (use opsite on PAC)  . Ultram [Tramadol] Palpitations    History   Social History  . Marital Status: Married    Spouse Name: N/A  . Number of Children: N/A  . Years of Education: N/A   Occupational History  . Not on file.   Social History Main Topics  . Smoking status: Never Smoker   . Smokeless tobacco: Never Used  . Alcohol Use: No  . Drug Use: No  . Sexual Activity: Not Currently   Other Topics Concern  . Not on file   Social History Narrative   Married mother of 3, grandmother 34.   Exercises 6/7 days a week walking 30 minutes a time.   Never smoked or drank alcohol.   Family History  Problem Relation Age of Onset  . Hypertension Mother      Wt Readings from Last 3 Encounters:  06/23/15 59.081 kg (130 lb 4 oz)  06/23/15 59.739 kg (131 lb 11.2 oz)  06/12/15 59.875 kg (132 lb)    PHYSICAL EXAM BP 170/96 mmHg  Pulse 64  Ht 5\' 3"  (1.6 m)  Wt 59.081 kg (130 lb 4 oz)  BMI 23.08 kg/m2 General appearance: alert, cooperative, appears stated age and no distress; wearing wig (loss of hair 2/2 chemo), In good spirits - pleasant afffect HEENT: [place]/AT, EOMI, MMM, anicteric sclera Neck: no adenopathy, no carotid bruit, no JVD and supple,  symmetrical, trachea midline Lungs: clear to auscultation bilaterally, normal percussion bilaterally and Nonlabored, good air movement Heart: regular rate and rhythm, S1, S2 normal, no murmur, click, rub or gallop and normal apical impulse Abdomen: soft, non-tender; bowel sounds normal; no masses, no organomegaly Extremities: extremities normal,  atraumatic, no cyanosis or edema; no significant bruising. Pulses: 2+ and symmetric Neurologic: Alert and oriented X 3, normal strength and tone. Normal symmetric reflexes. Normal coordination and gait; Cranial nerves: normal (II-XII grossly intact)   Adult ECG Report   Adult ECG Report  Rate: 64 ;  Rhythm: normal sinus rhythm and Nonspecific T-wave abnormality.  Narrative Interpretation: stable, relatively normal EKG   Other studies Reviewed: Additional studies/ records that were reviewed today include:  Recent Labs:   Lab Results  Component Value Date   WBC 5.1 06/23/2015   HGB 11.4* 06/23/2015   HCT 33.4* 06/23/2015   MCV 98.7 06/23/2015   PLT 71* 06/23/2015     Lab Results  Component Value Date   CREATININE 0.7 06/23/2015   Lab Results  Component Value Date   K 3.8 06/23/2015     ASSESSMENT / PLAN: Problem List Items Addressed This Visit    Dyslipidemia    Currently controlled with diet and exercise. Monitored by PCP.      Essential hypertension - Primary (Chronic)    Not available controlled today. Will restart losartan that she will taper up from 25-50 mg over a week. She was previously on 50 mg losartan. Would not use diuretics in the setting of potential GI issues from chemotherapy      Relevant Medications   metoprolol tartrate (LOPRESSOR) 25 MG tablet   losartan (COZAAR) 50 MG tablet   Other Relevant Orders   EKG 12-Lead (Completed)   PAF (paroxysmal atrial fibrillation) (Chronic)    Previously on beta blocker plus Rythmol. Currently not on Rythmol while on chemotherapy. Also Rythmol, she has had more  frequent episodes of A. Fib but still not been long-lasting.  Plan: Continue increased dose of beta blocker. Consider restarting Rythmol once her chemotherapy is completed. We compromised using aspirin plus Plavix for stroke prophylaxis as opposed to using full anticoagulation. Currently these medications on hold. 2 thrombocytopenia. She still is reluctant to go to full anticoagulation, especially while she is undergoing chemotherapy. I defer starting and stopping of Plavix to her oncologist. Blood consider the possibility of having full anticoagulation with recurrence of malignancy.      Relevant Medications   metoprolol tartrate (LOPRESSOR) 25 MG tablet   losartan (COZAAR) 50 MG tablet   Other Relevant Orders   EKG 12-Lead (Completed)      Current medicines are reviewed at length with the patient today. (+/- concerns) ASA/Plavix being held due to low Platelets, BP not adequately controlled. The following changes have been made:   Sneads -- OK to hold ASA/Plavix until Platelet levels improve ADDING LOSARTAN 50 MG--- FOR THE FIRST WEEK (7 DAYS) TAKE 1/2 TABLET OF 50 MG LOSARTAN THEN GO TO 50 MG DAILY      - CHECK BLOOD PRESSURE  TARGET  NUMBER TOP NUMBER SHOULD BE LESS THAN 150  TARGET BLOOD PRESSURE -140-150/80-90.  Your physician wants you to follow-up in 6 months Dr Ellyn Hack.  [name], M.D., M.S. Interventional Cardiologist   Pager # [phone]

## 2015-06-25 ENCOUNTER — Other Ambulatory Visit: Payer: Self-pay | Admitting: Oncology

## 2015-06-25 ENCOUNTER — Encounter: Payer: Self-pay | Admitting: Cardiology

## 2015-06-25 DIAGNOSIS — C7989 Secondary malignant neoplasm of other specified sites: Secondary | ICD-10-CM

## 2015-06-25 DIAGNOSIS — Z5111 Encounter for antineoplastic chemotherapy: Secondary | ICD-10-CM | POA: Insufficient documentation

## 2015-06-25 DIAGNOSIS — Z95828 Presence of other vascular implants and grafts: Secondary | ICD-10-CM | POA: Insufficient documentation

## 2015-06-25 MED ORDER — SODIUM CHLORIDE 0.9 % IV SOLN: Freq: Once | INTRAVENOUS | Status: DC

## 2015-06-25 NOTE — Assessment & Plan Note (Signed)
Not available controlled today. Will restart losartan that she will taper up from 25-50 mg over a week. She was previously on 50 mg losartan. Would not use diuretics in the setting of potential GI issues from chemotherapy

## 2015-06-25 NOTE — Assessment & Plan Note (Signed)
Currently controlled with diet and exercise. Monitored by PCP.

## 2015-06-25 NOTE — Assessment & Plan Note (Signed)
Previously on beta blocker plus Rythmol. Currently not on Rythmol while on chemotherapy. Also Rythmol, she has had more frequent episodes of A. Fib but still not been long-lasting.  Plan: Continue increased dose of beta blocker. Consider restarting Rythmol once her chemotherapy is completed. We compromised using aspirin plus Plavix for stroke prophylaxis as opposed to using full anticoagulation. Currently these medications on hold. 2 thrombocytopenia. She still is reluctant to go to full anticoagulation, especially while she is undergoing chemotherapy. I defer starting and stopping of Plavix to her oncologist. Blood consider the possibility of having full anticoagulation with recurrence of malignancy.

## 2015-06-26 ENCOUNTER — Ambulatory Visit (HOSPITAL_BASED_OUTPATIENT_CLINIC_OR_DEPARTMENT_OTHER): Payer: Medicare Other | Admitting: Genetic Counselor

## 2015-06-26 ENCOUNTER — Other Ambulatory Visit (HOSPITAL_BASED_OUTPATIENT_CLINIC_OR_DEPARTMENT_OTHER): Payer: Medicare Other

## 2015-06-26 ENCOUNTER — Encounter: Payer: Self-pay | Admitting: Genetic Counselor

## 2015-06-26 DIAGNOSIS — C562 Malignant neoplasm of left ovary: Secondary | ICD-10-CM | POA: Diagnosis not present

## 2015-06-26 DIAGNOSIS — Z8042 Family history of malignant neoplasm of prostate: Secondary | ICD-10-CM

## 2015-06-26 DIAGNOSIS — Z808 Family history of malignant neoplasm of other organs or systems: Secondary | ICD-10-CM | POA: Diagnosis not present

## 2015-06-26 DIAGNOSIS — Z85828 Personal history of other malignant neoplasm of skin: Secondary | ICD-10-CM | POA: Diagnosis not present

## 2015-06-26 DIAGNOSIS — C569 Malignant neoplasm of unspecified ovary: Secondary | ICD-10-CM

## 2015-06-26 DIAGNOSIS — Z315 Encounter for genetic counseling: Secondary | ICD-10-CM

## 2015-06-26 DIAGNOSIS — C7989 Secondary malignant neoplasm of other specified sites: Secondary | ICD-10-CM

## 2015-06-26 LAB — COMPREHENSIVE METABOLIC PANEL (CC13)
ALK PHOS: 79 U/L (ref 40–150)
ALT: 16 U/L (ref 0–55)
AST: 19 U/L (ref 5–34)
Albumin: 3.7 g/dL (ref 3.5–5.0)
Anion Gap: 5 mEq/L (ref 3–11)
BUN: 6.4 mg/dL — ABNORMAL LOW (ref 7.0–26.0)
CO2: 30 mEq/L — ABNORMAL HIGH (ref 22–29)
Calcium: 8.8 mg/dL (ref 8.4–10.4)
Chloride: 104 mEq/L (ref 98–109)
Creatinine: 0.6 mg/dL (ref 0.6–1.1)
EGFR: >90 mL/min/{1.73_m2} (ref >90)
Glucose: 88 mg/dl (ref 70–140)
Potassium: 3.9 mEq/L (ref 3.5–5.1)
SODIUM: 139 meq/L (ref 136–145)
TOTAL PROTEIN: 6.1 g/dL — AB (ref 6.4–8.3)
Total Bilirubin: 0.41 mg/dL (ref 0.20–1.20)

## 2015-06-26 LAB — CBC WITH DIFFERENTIAL/PLATELET
BASO%: 0.5 % (ref 0.0–2.0)
BASOS ABS: 0 10*3/uL (ref 0.0–0.1)
EOS ABS: 0 10*3/uL (ref 0.0–0.5)
EOS%: 0.3 % (ref 0.0–7.0)
HEMATOCRIT: 34.9 % (ref 34.8–46.6)
HGB: 11.6 g/dL (ref 11.6–15.9)
LYMPH%: 33.9 % (ref 14.0–49.7)
MCH: 33.2 pg (ref 25.1–34.0)
MCHC: 33.2 g/dL (ref 31.5–36.0)
MCV: 100 fL (ref 79.5–101.0)
MONO#: 0.7 10*3/uL (ref 0.1–0.9)
MONO%: 11.4 % (ref 0.0–14.0)
NEUT%: 53.9 % (ref 38.4–76.8)
NEUTROS ABS: 3.1 10*3/uL (ref 1.5–6.5)
Platelets: 106 10*3/uL — ABNORMAL LOW (ref 145–400)
RBC: 3.49 10*6/uL — ABNORMAL LOW (ref 3.70–5.45)
RDW: 15.2 % — AB (ref 11.2–14.5)
WBC: 5.8 10*3/uL (ref 3.9–10.3)
lymph#: 2 10*3/uL (ref 0.9–3.3)

## 2015-06-26 LAB — UA PROTEIN, DIPSTICK - CHCC: PROTEIN: NEGATIVE mg/dL

## 2015-06-26 NOTE — Progress Notes (Signed)
REFERRING PROVIDER: Evlyn Clines, MD  PRIMARY PROVIDER:  Precious Reel, MD  PRIMARY REASON FOR VISIT:  1. Malignant neoplasm of ovary, left   2. History of basal cell carcinoma   3. Family history of prostate cancer   4. Family history of basal cell carcinoma      HISTORY OF PRESENT ILLNESS:   [name], a 74 y.o. female, was seen for a [facility] cancer genetics consultation at the request of Dr. Marko Plume due to a personal history of ovarian cancer and family history of prostate cancer.  [name] presents to clinic today to discuss the possibility of a hereditary predisposition to cancer, genetic testing, and to further clarify her future cancer risks, as well as potential cancer risks for family members.   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 This was treated with "TAH/BSO/ omentectomy/ureterolysis/ resection of cul-de-sac tumor/right pelvic lymphadnectomy and resection of rectum with reanastomosis" as well as chemotherapy.  There has recently been a recurrence of this ovarian cancer with pelvic involvement, and [name] has resumed chemotherapy treatments. She also has a history of basal cell carcinomas, with the first perhaps diagnosed at the age of 74.    CANCER HISTORY:    Malignant neoplasm of ovary   03/11/2013 Initial Diagnosis Malignant neoplasm of ovary   Basal cell carcinoma dx. Approx. 63   HORMONAL RISK FACTORS:  Menarche was at age 8.5-11 years.  First live birth at age 85.  OCP use for approximately 3-4 months years.  Ovaries intact: no.  Hysterectomy: yes.  Menopausal status: postmenopausal.  HRT use: approximately 20 years years. Colonoscopy: yes; normal. Mammogram within the last year: yes. Number of breast biopsies: 1 benign in 2015- "benign fibroadenoma with fibrocystic change and usual ductal hyperplasia of L breast" Up to date with pelvic exams:  yes. Any  excessive radiation exposure in the past:  no  Past Medical History  Diagnosis Date  . Hypertension   . Dyslipidemia   . Hypothyroidism   . Arthritis     OSTEOARTHRITIS   -- CONSTANT PAIN RIGHT HIP---AND PAIN LEFT KNEE--PT STATES SHE GETS INJECTIONS INTO HER KNEE  . PAF (paroxysmal atrial fibrillation)     Only on Plavix -- not full Anticoagulation per pt. request  . Complication of anesthesia     BLOOD PRESSURE DROPPED WITH NASAL SURGERY, ONE OF THE CARPAL TUNNEL REPAIRS AND DURING A COLONOSCOPY  . History of skin cancer   . Constipation   . Ovarian cancer 01/2013    Recurrence since 2014    Past Surgical History  Procedure Laterality Date  . Bilateral carpal tunnel repair  2007  . Dilation and curettage of uterus  1969  . Surgery for ruptured ovarian cyst  1969  . Rhinoplasty for fractured nose  1986  . Left knee arthroscopy   2011  . Total hip arthroplasty  02/25/2012    Procedure: TOTAL HIP ARTHROPLASTY ANTERIOR APPROACH;  Surgeon: Mcarthur Rossetti, MD;  Location: [facility];  Service: Orthopedics;  Laterality: Right;  . Tonsillectomy  1962  . Laparotomy Bilateral 02/13/2013    Procedure: EXPLORATORY LAPAROTOMY TOTAL ABDOMINAL HYSTERECTOMY BILATERAL SALPINGO-OOPHORECTOMY, Partial Rectal Resection with Reanastamosis;  Surgeon: Alvino Chapel, MD;  Location: [facility];  Service: Gynecology;  Laterality: Bilateral;  . Omentectomy  02/13/2013    Procedure: OMENTECTOMY;  Surgeon: Alvino Chapel, MD;  Location: [facility];  Service: Gynecology;;  . Lymphadenectomy  Right 02/13/2013    Procedure: PEVLIC  LYMPHADENECTOMY, DEBULKING right pelvic tumor nodules;  Surgeon: Alvino Chapel, MD;  Location: [facility];  Service: Gynecology;  Laterality: Right;  . Total abdominal hysterectomy  01/2013  . Transthoracic echocardiogram  May 2014    Normal LV size function. EF 60-65%. Grade 1 diastolic function. Mild MR and mildly elevated PA pressures of 37 mmHg per  . Nm myoview ltd   June 2010     subbmaximal with no ischemia or infarction.  . Total knee arthroplasty Left 01/03/2015    Procedure: LEFT TOTAL KNEE ARTHROPLASTY;  Surgeon: Mcarthur Rossetti, MD;  Location: [facility];  Service: Orthopedics;  Laterality: Left;    History   Social History  . Marital Status: Married    Spouse Name: N/A  . Number of Children: N/A  . Years of Education: N/A   Social History Main Topics  . Smoking status: Never Smoker   . Smokeless tobacco: Never Used  . Alcohol Use: No  . Drug Use: No  . Sexual Activity: Not Currently   Other Topics Concern  . Not on file   Social History Narrative   Married mother of 3, grandmother 44.   Exercises 6/7 days a week walking 30 minutes a time.   Never smoked or drank alcohol.     FAMILY HISTORY:  We obtained a detailed, 4-generation family history.  Significant diagnoses are listed below: Family History  Problem Relation Age of Onset  . Hypertension Mother   . Basal cell carcinoma Mother   . AAA (abdominal aortic aneurysm) Father   . Heart Problems Maternal Uncle   . Heart Problems Maternal Grandfather   . AAA (abdominal aortic aneurysm) Paternal Grandmother   . Prostate cancer Maternal Uncle 70  . Prostate cancer Other   . Other Daughter     one daughter had TAH-BSO at 32; other daughter will have one soon    [name] has two daughters who are fraternal twins, both age 100.  One daughter had a TAH-BSO at the age of 30 due to the family history and her other daughter has plans for a TAH-BSO soon.  She has three grandsons and two granddaughters.  [name] has one brother, age 77 who is cancer-free.  She also has one sister, age 65, with him she has no contact.  Her mother passed away at 62 from a brain hemorrhage; she had a history of basal cell carcinomas but no other cancers.  Her mother was a smoker.  Her father passed away at 67; he had an aortic abdominal aneurysm, but no cancer.  [name] father was an only  child.  His mother also died of an aortic abdominal aneurysm at the age of 34.  His father passed away in his late 51s, reportedly (from other family members) due to pneumonia.  There is a maternal family history of prostate cancer in a maternal uncle (1/2).  This uncle was diagnosed with prostate cancer at 50, and passed away in his late 39s.  Her other maternal uncle lived cancer-free until the age of 42, when he died from heart-related problems.  There is no known cancer in any maternal first cousins.  One maternal first cousin died of multiple sclerosis at 59.  [name] maternal grandmother was cancer-free and passed away at 73.  Her maternal grandfather died of heart problems at the age of 38.  Two maternal great uncles (maternal grandfather's brothers) were diagnosed with prostate cancer--one  in his late 70s and the other at an unknown age.  There is no known cancer in the third maternal great uncle or in any of her four maternal great aunts.    Patient's maternal ancestors are of English descent, and paternal ancestors are of [place]ch-Irish descent. There is no reported Ashkenazi Jewish ancestry. There is no known consanguinity.  GENETIC COUNSELING ASSESSMENT: [name] is a 74 y.o. female with a personal and family history of cancer which is somewhat suggestive of hereditary breast and ovarian cancer syndrome and predisposition to cancer. We, therefore, discussed and recommended the following at today's visit.   DISCUSSION: We reviewed the characteristics, features and inheritance patterns of hereditary cancer syndromes, particularly those caused by changes within the BRCA1/2 and Lynch syndrome genes. We also discussed genetic testing, including the appropriate family members to test, the process of testing, insurance coverage and turn-around-time for results. We discussed the implications of a negative, positive and/or variant of uncertain significant result. We recommended Ms.  Owens pursue genetic testing for the 20-gene Breast/Ovarian Cancer Panel through [facility] Hope Pigeon, MD).   Based on [name]'s personal and family history of cancer, she meets medical criteria for genetic testing. Despite that she meets criteria, she may still have an out of pocket cost. We discussed that if her out of pocket cost for testing is over $100, the laboratory will call and confirm whether she wants to proceed with testing.  If the out of pocket cost of testing is less than $100 she will be billed by the genetic testing laboratory.   PLAN: After considering the risks, benefits, and limitations, [name]  provided informed consent to pursue genetic testing and the blood sample was sent to [facility] for analysis of the 20-gene Breast/Ovarian Cancer.  The Breast/Ovarian gene panel offered by [facility] includes sequencing and deletion/duplication analysis for the following 19 genes:  ATM, BARD1, BRCA1, BRCA2, BRIP1, CDH1, CHEK2, FANCC, MLH1, MSH2, MSH6, NBN, PALB2, PMS2, PTEN, RAD51C, RAD51D, TP53, and XRCC2.  This panel also includes deletion/duplication analysis (without sequencing) for one gene, EPCAM.  Results should be available within approximately 2-3 weeks' time, at which point they will be disclosed by telephone to [name], as will any additional recommendations warranted by these results. [name] will receive a summary of her genetic counseling visit and a copy of her results once available. This information will also be available in Epic. We encouraged [name] to remain in contact with cancer genetics annually so that we can continuously update the family history and inform her of any changes in cancer genetics and testing that may be of benefit for her family. [name] questions were answered to her satisfaction today. Our contact information was provided should additional questions or concerns arise.  Thank you for the referral  and allowing [place] to share in the care of your patient.   Jeanine Luz, MS Genetic Counselor [email or web address] Phone: [phone]  The patient was seen for a total of 45 minutes in face-to-face genetic counseling.  This patient was discussed with Drs. Magrinat, Lindi Adie and/or Burr Medico who agrees with the above.    _______________________________________________________________________ For Office Staff:  Number of people involved in session: 1 Was an Intern/ student involved with case: no

## 2015-06-27 ENCOUNTER — Encounter: Payer: Self-pay | Admitting: General Practice

## 2015-06-27 NOTE — Progress Notes (Signed)
Spiritual Care Note  Reached [name] by phone, providing spiritual and emotional support as she processed the "new normal" of living with cancer as a chronic, recurring condition, rather than a past experience from which she was cured.  Provided pastoral presence, reflective listening, normalization of feelings.  She reports good support from family and church, [employer].  Church members send cards, make calls, offer rides.  When she feels well, [name] is very involved with Sunday School, handbells, and [employer] choir, all important sources of meaning-making, contribution, community, and support.  Per pt, she struggles deeply with losing her hair--especially now that she anticipates repetition.  Recommended GYN cancer support group, Living with Cancer group/program, caregiver support event for family Garrod worries about their distress), and ongoing availability of LCSW and chaplain for emotional/spiritual support.  Per pt, evening events are difficult because of energy; mornings are better.  Provided her with meeting times and [facility] staff names/contact info.  Encouraged her to reach out anytime; at this time, she anticipates desiring support after tx or upon facing next round of tx.  She verbalized gratitude for the call and used opportunity constructively for processing and reflection.  Plan to send f/u note of encouragement, but please also page as needs arise.  Thank you.  [place], [place] Pager  [phone] Voicemail  [phone]

## 2015-06-28 ENCOUNTER — Other Ambulatory Visit: Payer: Self-pay | Admitting: Oncology

## 2015-06-28 DIAGNOSIS — C7989 Secondary malignant neoplasm of other specified sites: Secondary | ICD-10-CM

## 2015-06-28 DIAGNOSIS — C562 Malignant neoplasm of left ovary: Secondary | ICD-10-CM

## 2015-07-02 ENCOUNTER — Ambulatory Visit (HOSPITAL_COMMUNITY)
Admission: RE | Admit: 2015-07-02 | Discharge: 2015-07-02 | Disposition: A | Discharge status: Home / Self Care | Payer: Medicare Other | Source: Ambulatory Visit | Attending: Oncology | Admitting: Oncology

## 2015-07-02 ENCOUNTER — Encounter (HOSPITAL_COMMUNITY): Payer: Self-pay

## 2015-07-02 DIAGNOSIS — K449 Diaphragmatic hernia without obstruction or gangrene: Secondary | ICD-10-CM | POA: Insufficient documentation

## 2015-07-02 DIAGNOSIS — R590 Localized enlarged lymph nodes: Secondary | ICD-10-CM | POA: Diagnosis not present

## 2015-07-02 DIAGNOSIS — C569 Malignant neoplasm of unspecified ovary: Secondary | ICD-10-CM | POA: Diagnosis present

## 2015-07-02 DIAGNOSIS — R970 Elevated carcinoembryonic antigen [CEA]: Secondary | ICD-10-CM | POA: Diagnosis not present

## 2015-07-02 DIAGNOSIS — I7090 Unspecified atherosclerosis: Secondary | ICD-10-CM | POA: Diagnosis not present

## 2015-07-02 DIAGNOSIS — D739 Disease of spleen, unspecified: Secondary | ICD-10-CM | POA: Insufficient documentation

## 2015-07-02 DIAGNOSIS — C7989 Secondary malignant neoplasm of other specified sites: Secondary | ICD-10-CM

## 2015-07-02 MED ORDER — IOHEXOL 300 MG/ML  SOLN
100.0000 mL | Freq: Once | INTRAMUSCULAR | Status: AC | PRN
  Administered 2015-07-02: 100 mL via INTRAVENOUS

## 2015-07-04 ENCOUNTER — Encounter: Payer: Self-pay | Admitting: Gynecology

## 2015-07-04 ENCOUNTER — Other Ambulatory Visit (HOSPITAL_BASED_OUTPATIENT_CLINIC_OR_DEPARTMENT_OTHER): Payer: Medicare Other

## 2015-07-04 ENCOUNTER — Ambulatory Visit (HOSPITAL_BASED_OUTPATIENT_CLINIC_OR_DEPARTMENT_OTHER): Payer: Medicare Other

## 2015-07-04 ENCOUNTER — Ambulatory Visit: Payer: Medicare Other | Attending: Gynecology | Admitting: Gynecology

## 2015-07-04 VITALS — BP 159/80 | HR 64 | Temp 98.3°F | Resp 18 | Ht 63.0 in | Wt 132.3 lb

## 2015-07-04 DIAGNOSIS — C562 Malignant neoplasm of left ovary: Secondary | ICD-10-CM

## 2015-07-04 DIAGNOSIS — C569 Malignant neoplasm of unspecified ovary: Secondary | ICD-10-CM

## 2015-07-04 DIAGNOSIS — Z5111 Encounter for antineoplastic chemotherapy: Secondary | ICD-10-CM

## 2015-07-04 DIAGNOSIS — C7989 Secondary malignant neoplasm of other specified sites: Secondary | ICD-10-CM

## 2015-07-04 DIAGNOSIS — R971 Elevated cancer antigen 125 [CA 125]: Secondary | ICD-10-CM | POA: Diagnosis not present

## 2015-07-04 DIAGNOSIS — C561 Malignant neoplasm of right ovary: Secondary | ICD-10-CM

## 2015-07-04 LAB — CBC WITH DIFFERENTIAL/PLATELET
BASO%: 1.1 % (ref 0.0–2.0)
Basophils Absolute: 0 10*3/uL (ref 0.0–0.1)
EOS%: 0.3 % (ref 0.0–7.0)
Eosinophils Absolute: 0 10*3/uL (ref 0.0–0.5)
HCT: 35.3 % (ref 34.8–46.6)
HGB: 12 g/dL (ref 11.6–15.9)
LYMPH#: 1.3 10*3/uL (ref 0.9–3.3)
LYMPH%: 38.1 % (ref 14.0–49.7)
MCH: 33.8 pg (ref 25.1–34.0)
MCHC: 34 g/dL (ref 31.5–36.0)
MCV: 99.6 fL (ref 79.5–101.0)
MONO#: 0.5 10*3/uL (ref 0.1–0.9)
MONO%: 14.7 % — AB (ref 0.0–14.0)
NEUT#: 1.5 10*3/uL (ref 1.5–6.5)
NEUT%: 45.8 % (ref 38.4–76.8)
Platelets: 195 10*3/uL (ref 145–400)
RBC: 3.55 10*6/uL — ABNORMAL LOW (ref 3.70–5.45)
RDW: 15.7 % — ABNORMAL HIGH (ref 11.2–14.5)
WBC: 3.4 10*3/uL — ABNORMAL LOW (ref 3.9–10.3)

## 2015-07-04 MED ORDER — TBO-FILGRASTIM 300 MCG/0.5ML ~~LOC~~ SOSY
300.0000 ug | PREFILLED_SYRINGE | Freq: Once | SUBCUTANEOUS | Status: AC
  Administered 2015-07-04: 300 ug via SUBCUTANEOUS
  Filled 2015-07-04: qty 0.5

## 2015-07-04 NOTE — Patient Instructions (Addendum)
We will continue chemotherapy for 3 more cycles and obtain a CA-125 at each cycle. Following completing all therapy we will obtain another scan.  Plan to follow up with Dr. Fermin Schwab after the completion of three more cycles.

## 2015-07-04 NOTE — Progress Notes (Signed)
Consult Note: Gyn-Onc   [name] 74 y.o. female  Chief Complaint  Patient presents with  . Ovarian Cancer    follow-up    Assessment and plan :   recurrentStage IIb poorly differentiated ovarian cancer currently responding to therapy with carboplatin , Taxol, and a Avastin. The patient is having significant fatigue for approximately week after each chemotherapy cycle..     Interval History:   The patient returns today as previously scheduled.  Since her last visit she's   Received 3 cycles of carboplatin, Taxol, and a Avastin for treatment of recurrent disease. Based on the fall in tumor markers and a recent CT scan it appears the patient is responding. Her primary complaint is that she has severe fatigue for approximately 1 week after each treatment. She has some nausea without any vomiting. Otherwise she has no abdominal or pelvic symptoms.  HPI:The patient initially presented with a pelvic mass and elevated CA 125 (178 units per mL) She underwent exploratory laparotomy and debulking on 02/13/2013. Final pathology showed a poorly differentiated ovarian cancer involving both ovaries pelvic peritoneum and rectal muscularis. All gross disease was resected.  She then received 6 cycles of carboplatin and Taxol chemotherapy completed in September 2014. At the completion of chemotherapy her CA 125 was 18 units per mL.    in May 2016 the patient CA-125 began to rise and a PET CT scan showed metastatic disease in the spleen and pelvic lymph nodes. Given the long platinum free interval , we retreated with carboplatin, Taxol, and a Avastin.   Review of Systems:10 point review of systems is negative except as noted in interval history.   Vitals: Blood pressure 159/80, pulse 64, temperature 98.3 F (36.8 C), temperature source Oral, resp. rate 18, height 5\' 3"  (1.6 m), weight 132 lb 4.8 oz (60.011 kg), SpO2 100 %.  Physical Exam: General : The patient is a healthy woman in no acute  distress.  HEENT: normocephalic, extraoccular movements normal; neck is supple without thyromegally  Lynphnodes: Supraclavicular and inguinal nodes not enlarged  Abdomen: Soft, non-tender, no ascites, no organomegally, no masses, no hernias , midline incision is healing well  Pelvic:    EGBUS: Normal female  Vagina bladder urethra: Normal  Vaginal cuff is well healed.  Cervix and uterus are surgically absent  Bimanual exam: No masses nodularity or fullness.  Rectovaginal exam confirms      Lower extremities: No edema or varicosities. Normal range of motion      Allergies  Allergen Reactions  . Ceftin [Cefuroxime Axetil]     Interferes with propafenone.    . Codeine Other (See Comments)    Does not like the feeling she gets  . Flexeril [Cyclobenzaprine] Other (See Comments)    Pt states "increased heart rate"  . Levaquin [Levofloxacin In D5w]     Interferes with propafenone.   . Lisinopril     LIP NUMBNESS  . Tegaderm Ag Mesh [Silver]   . Tape Rash    TEGADERM.   (use opsite on PAC)  . Ultram [Tramadol] Palpitations    Past Medical History  Diagnosis Date  . Hypertension   . Dyslipidemia   . Hypothyroidism   . Arthritis     OSTEOARTHRITIS   -- CONSTANT PAIN RIGHT HIP---AND PAIN LEFT KNEE--PT STATES SHE GETS INJECTIONS INTO HER KNEE  . PAF (paroxysmal atrial fibrillation)     Only on Plavix -- not full Anticoagulation per pt. request  . Complication of anesthesia  BLOOD PRESSURE DROPPED WITH NASAL SURGERY, ONE OF THE CARPAL TUNNEL REPAIRS AND DURING A COLONOSCOPY  . History of skin cancer   . Constipation   . Ovarian cancer 01/2013    Recurrence since 2014/2016    Past Surgical History  Procedure Laterality Date  . Bilateral carpal tunnel repair  2007  . Dilation and curettage of uterus  1969  . Surgery for ruptured ovarian cyst  1969  . Rhinoplasty for fractured nose  1986  . Left knee arthroscopy   2011  . Total hip arthroplasty  02/25/2012     Procedure: TOTAL HIP ARTHROPLASTY ANTERIOR APPROACH;  Surgeon: Mcarthur Rossetti, MD;  Location: [facility];  Service: Orthopedics;  Laterality: Right;  . Tonsillectomy  1962  . Laparotomy Bilateral 02/13/2013    Procedure: EXPLORATORY LAPAROTOMY TOTAL ABDOMINAL HYSTERECTOMY BILATERAL SALPINGO-OOPHORECTOMY, Partial Rectal Resection with Reanastamosis;  Surgeon: Alvino Chapel, MD;  Location: [facility];  Service: Gynecology;  Laterality: Bilateral;  . Omentectomy  02/13/2013    Procedure: OMENTECTOMY;  Surgeon: Alvino Chapel, MD;  Location: [facility];  Service: Gynecology;;  . Lymphadenectomy Right 02/13/2013    Procedure: PEVLIC  LYMPHADENECTOMY, DEBULKING right pelvic tumor nodules;  Surgeon: Alvino Chapel, MD;  Location: [facility];  Service: Gynecology;  Laterality: Right;  . Total abdominal hysterectomy  01/2013  . Transthoracic echocardiogram  May 2014    Normal LV size function. EF 60-65%. Grade 1 diastolic function. Mild MR and mildly elevated PA pressures of 37 mmHg per  . Nm myoview ltd  June 2010     subbmaximal with no ischemia or infarction.  . Total knee arthroplasty Left 01/03/2015    Procedure: LEFT TOTAL KNEE ARTHROPLASTY;  Surgeon: Mcarthur Rossetti, MD;  Location: [facility];  Service: Orthopedics;  Laterality: Left;    Current Outpatient Prescriptions  Medication Sig Dispense Refill  . losartan (COZAAR) 50 MG tablet Take 1 tablet (50 mg total) by mouth daily. 90 tablet 3  . amoxicillin (AMOXIL) 500 MG capsule Piror to dental procedures    . aspirin EC 81 MG tablet Take 81 mg by mouth every morning.     . bisacodyl (DULCOLAX) 5 MG EC tablet Take 5 mg by mouth daily as needed for moderate constipation.    . clopidogrel (PLAVIX) 75 MG tablet TAKE 1 TABLET BY MOUTH AT BEDTIME (Patient taking differently: Take 1 tablet by mouth at bedtime.) 90 tablet 3  . desonide (DESOWEN) 0.05 % cream Apply 1 application topically 2 (two) times daily as needed (rash).   1  .  dexamethasone (DECADRON) 4 MG tablet Take 5 tabs with food (=20mg ) 12 hrs and 6 hrs prior to chemotherapy. 20 tablet 1  . docusate sodium (COLACE) 100 MG capsule Take 100 mg by mouth 2 (two) times daily as needed for mild constipation.     Marland Kitchen estradiol (ESTRACE) 1 MG tablet Take 1 tablet (1 mg total) by mouth every morning. PT STATES SHE WANTS ESTRACE--NOT THE GENERIC 90 tablet 3  . Glucosamine HCl (GLUCOSAMINE PO) Take 1,000 mg by mouth every morning.     Marland Kitchen levothyroxine (SYNTHROID, LEVOTHROID) 88 MCG tablet Take 88 mcg by mouth every morning. Takes on an empty stomach. PT STATES SHE NEEDS THE SYNTHROID--DOES NOT WANT THE GENERIC    . lidocaine-prilocaine (EMLA) cream Apply to Porta-Cath site 1-2 hours prior to access as directed. 30 g 2  . loratadine (CLARITIN) 10 MG tablet Take 10 mg by mouth daily as needed for allergies.    Marland Kitchen  LORazepam (ATIVAN) 1 MG tablet Place 1/2 -1 tablet under the tongue or swallow every 6 hrs as needed for nausea. Will make drowsy. 20 tablet 0  . Menthol, Topical Analgesic, (ICY HOT EX) Apply 1 application topically daily as needed (pain.).    Marland Kitchen metoprolol tartrate (LOPRESSOR) 25 MG tablet Take 2 tablets by mouth 2 (two) times daily. Take 2 tabs twice a day    . Multiple Vitamin (MULTIVITAMIN WITH MINERALS) TABS tablet Take 1 tablet by mouth every morning.     . ondansetron (ZOFRAN) 8 MG tablet Take 1 tablet (8 mg total) by mouth every 8 (eight) hours as needed for nausea or vomiting (Will not make drowsy). 30 tablet 1  . pantoprazole (PROTONIX) 40 MG tablet Take 1 tablet (40 mg total) by mouth daily. For GERD 30 tablet 3  . Polyethyl Glycol-Propyl Glycol (SYSTANE OP) Place 1 drop into both eyes 2 (two) times daily.     . polyethylene glycol (MIRALAX / GLYCOLAX) packet Take 17 g by mouth every morning.     . potassium chloride (K-DUR) 10 MEQ tablet Take 10 mEq by mouth every Monday, Wednesday, and Friday.    . propafenone (RYTHMOL) 225 MG tablet Take 1 tablet (225 mg total)  by mouth 2 (two) times daily. 180 tablet 3  . Wheat Dextrin (BENEFIBER) POWD Take 2 scoop by mouth every morning.      No current facility-administered medications for this visit.    History   Social History  . Marital Status: Married    Spouse Name: N/A  . Number of Children: N/A  . Years of Education: N/A   Occupational History  . Not on file.   Social History Main Topics  . Smoking status: Never Smoker   . Smokeless tobacco: Never Used  . Alcohol Use: No  . Drug Use: No  . Sexual Activity: Not Currently   Other Topics Concern  . Not on file   Social History Narrative   Married mother of 3, grandmother 32.   Exercises 6/7 days a week walking 30 minutes a time.   Never smoked or drank alcohol.    Family History  Problem Relation Age of Onset  . Hypertension Mother   . Basal cell carcinoma Mother   . AAA (abdominal aortic aneurysm) Father   . Heart Problems Maternal Uncle   . Heart Problems Maternal Grandfather   . AAA (abdominal aortic aneurysm) Paternal Grandmother   . Prostate cancer Maternal Uncle 70  . Prostate cancer Other   . Other Daughter     one daughter had TAH-BSO at 16; other daughter will have one soon      [place], MD 07/04/2015, 11:58 AM

## 2015-07-05 ENCOUNTER — Ambulatory Visit: Payer: Medicare Other

## 2015-07-06 ENCOUNTER — Other Ambulatory Visit: Payer: Self-pay | Admitting: Oncology

## 2015-07-06 DIAGNOSIS — C7989 Secondary malignant neoplasm of other specified sites: Secondary | ICD-10-CM

## 2015-07-06 DIAGNOSIS — C562 Malignant neoplasm of left ovary: Secondary | ICD-10-CM

## 2015-07-07 ENCOUNTER — Encounter: Payer: Self-pay | Admitting: Oncology

## 2015-07-07 ENCOUNTER — Ambulatory Visit: Payer: Medicare Other

## 2015-07-07 ENCOUNTER — Telehealth: Payer: Self-pay | Admitting: Oncology

## 2015-07-07 ENCOUNTER — Other Ambulatory Visit (HOSPITAL_BASED_OUTPATIENT_CLINIC_OR_DEPARTMENT_OTHER): Payer: Medicare Other

## 2015-07-07 ENCOUNTER — Ambulatory Visit (HOSPITAL_BASED_OUTPATIENT_CLINIC_OR_DEPARTMENT_OTHER): Payer: Medicare Other | Admitting: Oncology

## 2015-07-07 ENCOUNTER — Ambulatory Visit (HOSPITAL_BASED_OUTPATIENT_CLINIC_OR_DEPARTMENT_OTHER): Payer: Medicare Other

## 2015-07-07 VITALS — BP 163/78 | HR 60

## 2015-07-07 VITALS — BP 171/78 | HR 68 | Temp 98.4°F | Resp 18 | Ht 63.0 in | Wt 132.7 lb

## 2015-07-07 DIAGNOSIS — K219 Gastro-esophageal reflux disease without esophagitis: Secondary | ICD-10-CM

## 2015-07-07 DIAGNOSIS — Z95828 Presence of other vascular implants and grafts: Secondary | ICD-10-CM

## 2015-07-07 DIAGNOSIS — C569 Malignant neoplasm of unspecified ovary: Secondary | ICD-10-CM

## 2015-07-07 DIAGNOSIS — I10 Essential (primary) hypertension: Secondary | ICD-10-CM | POA: Diagnosis not present

## 2015-07-07 DIAGNOSIS — C562 Malignant neoplasm of left ovary: Secondary | ICD-10-CM | POA: Diagnosis not present

## 2015-07-07 DIAGNOSIS — D701 Agranulocytosis secondary to cancer chemotherapy: Secondary | ICD-10-CM

## 2015-07-07 DIAGNOSIS — Z5112 Encounter for antineoplastic immunotherapy: Secondary | ICD-10-CM | POA: Diagnosis not present

## 2015-07-07 DIAGNOSIS — C561 Malignant neoplasm of right ovary: Secondary | ICD-10-CM

## 2015-07-07 DIAGNOSIS — C7989 Secondary malignant neoplasm of other specified sites: Secondary | ICD-10-CM

## 2015-07-07 DIAGNOSIS — Z5111 Encounter for antineoplastic chemotherapy: Secondary | ICD-10-CM

## 2015-07-07 DIAGNOSIS — J3489 Other specified disorders of nose and nasal sinuses: Secondary | ICD-10-CM

## 2015-07-07 DIAGNOSIS — K5900 Constipation, unspecified: Secondary | ICD-10-CM

## 2015-07-07 DIAGNOSIS — T451X5A Adverse effect of antineoplastic and immunosuppressive drugs, initial encounter: Secondary | ICD-10-CM

## 2015-07-07 LAB — CBC WITH DIFFERENTIAL/PLATELET
BASO%: 0.2 % (ref 0.0–2.0)
BASOS ABS: 0 10*3/uL (ref 0.0–0.1)
EOS%: 0 % (ref 0.0–7.0)
Eosinophils Absolute: 0 10*3/uL (ref 0.0–0.5)
HEMATOCRIT: 34.5 % — AB (ref 34.8–46.6)
HEMOGLOBIN: 11.6 g/dL (ref 11.6–15.9)
LYMPH%: 8.8 % — ABNORMAL LOW (ref 14.0–49.7)
MCH: 33.6 pg (ref 25.1–34.0)
MCHC: 33.6 g/dL (ref 31.5–36.0)
MCV: 100 fL (ref 79.5–101.0)
MONO#: 0 10*3/uL — ABNORMAL LOW (ref 0.1–0.9)
MONO%: 0.6 % (ref 0.0–14.0)
NEUT#: 5.8 10*3/uL (ref 1.5–6.5)
NEUT%: 90.4 % — AB (ref 38.4–76.8)
Platelets: 149 10*3/uL (ref 145–400)
RBC: 3.45 10*6/uL — AB (ref 3.70–5.45)
RDW: 15.2 % — AB (ref 11.2–14.5)
WBC: 6.4 10*3/uL (ref 3.9–10.3)
lymph#: 0.6 10*3/uL — ABNORMAL LOW (ref 0.9–3.3)

## 2015-07-07 LAB — COMPREHENSIVE METABOLIC PANEL (CC13)
ALK PHOS: 74 U/L (ref 40–150)
ALT: 13 U/L (ref 0–55)
ANION GAP: 11 meq/L (ref 3–11)
AST: 12 U/L (ref 5–34)
Albumin: 3.8 g/dL (ref 3.5–5.0)
BUN: 10.5 mg/dL (ref 7.0–26.0)
CALCIUM: 8.9 mg/dL (ref 8.4–10.4)
CO2: 24 meq/L (ref 22–29)
Chloride: 104 mEq/L (ref 98–109)
Creatinine: 0.7 mg/dL (ref 0.6–1.1)
EGFR: 87 mL/min/{1.73_m2} — ABNORMAL LOW (ref >90)
Glucose: 221 mg/dl — ABNORMAL HIGH (ref 70–140)
Potassium: 4 mEq/L (ref 3.5–5.1)
Sodium: 138 mEq/L (ref 136–145)
Total Bilirubin: 0.52 mg/dL (ref 0.20–1.20)
Total Protein: 6.1 g/dL — ABNORMAL LOW (ref 6.4–8.3)

## 2015-07-07 LAB — CA 125: CA 125: 14 U/mL (ref <35)

## 2015-07-07 MED ORDER — PACLITAXEL CHEMO INJECTION 300 MG/50ML
135.0000 mg/m2 | Freq: Once | INTRAVENOUS | Status: AC
  Administered 2015-07-07: 222 mg via INTRAVENOUS
  Filled 2015-07-07: qty 37

## 2015-07-07 MED ORDER — CARBOPLATIN CHEMO INTRADERMAL TEST DOSE 100MCG/0.02ML
100.0000 ug | Freq: Once | INTRADERMAL | Status: AC
  Administered 2015-07-07: 100 ug via INTRADERMAL
  Filled 2015-07-07: qty 0.01

## 2015-07-07 MED ORDER — FAMOTIDINE IN NACL 20-0.9 MG/50ML-% IV SOLN
INTRAVENOUS | Status: AC
  Filled 2015-07-07: qty 50

## 2015-07-07 MED ORDER — SODIUM CHLORIDE 0.9 % IV SOLN
300.0000 mg | Freq: Once | INTRAVENOUS | Status: AC
  Administered 2015-07-07: 300 mg via INTRAVENOUS
  Filled 2015-07-07: qty 30

## 2015-07-07 MED ORDER — HEPARIN SOD (PORK) LOCK FLUSH 100 UNIT/ML IV SOLN
500.0000 [IU] | Freq: Once | INTRAVENOUS | Status: AC | PRN
  Administered 2015-07-07: 500 [IU]
  Filled 2015-07-07: qty 5

## 2015-07-07 MED ORDER — DIPHENHYDRAMINE HCL 50 MG/ML IJ SOLN
50.0000 mg | Freq: Once | INTRAMUSCULAR | Status: AC
  Administered 2015-07-07: 50 mg via INTRAVENOUS

## 2015-07-07 MED ORDER — SODIUM CHLORIDE 0.9 % IV SOLN
Freq: Once | INTRAVENOUS | Status: AC
  Administered 2015-07-07: 11:00:00 via INTRAVENOUS
  Filled 2015-07-07: qty 8

## 2015-07-07 MED ORDER — FAMOTIDINE IN NACL 20-0.9 MG/50ML-% IV SOLN
20.0000 mg | Freq: Once | INTRAVENOUS | Status: AC
  Administered 2015-07-07: 20 mg via INTRAVENOUS

## 2015-07-07 MED ORDER — SODIUM CHLORIDE 0.9 % IJ SOLN
10.0000 mL | INTRAMUSCULAR | Status: DC | PRN
  Administered 2015-07-07: 10 mL
  Filled 2015-07-07: qty 10

## 2015-07-07 MED ORDER — SODIUM CHLORIDE 0.9 % IV SOLN
15.0000 mg/kg | Freq: Once | INTRAVENOUS | Status: AC
  Administered 2015-07-07: 900 mg via INTRAVENOUS
  Filled 2015-07-07: qty 36

## 2015-07-07 MED ORDER — DEXAMETHASONE 4 MG PO TABS: ORAL_TABLET | ORAL | Status: DC

## 2015-07-07 MED ORDER — DIPHENHYDRAMINE HCL 50 MG/ML IJ SOLN
INTRAMUSCULAR | Status: AC
  Filled 2015-07-07: qty 1

## 2015-07-07 MED ORDER — SODIUM CHLORIDE 0.9 % IJ SOLN
10.0000 mL | INTRAMUSCULAR | Status: DC | PRN
  Administered 2015-07-07: 10 mL via INTRAVENOUS
  Filled 2015-07-07: qty 10

## 2015-07-07 MED ORDER — SODIUM CHLORIDE 0.9 % IV SOLN
Freq: Once | INTRAVENOUS | Status: AC
  Administered 2015-07-07: 10:00:00 via INTRAVENOUS

## 2015-07-07 NOTE — Patient Instructions (Signed)

## 2015-07-07 NOTE — Patient Instructions (Signed)
[facility] Discharge Instructions for Patients Receiving Chemotherapy  Today you received the following chemotherapy agents avastin, taxol, carboplatin  To help prevent nausea and vomiting after your treatment, we encourage you to take your nausea medication as directed by Dr Marko Plume   If you develop nausea and vomiting that is not controlled by your nausea medication, call the clinic.   BELOW ARE SYMPTOMS THAT SHOULD BE REPORTED IMMEDIATELY:  *FEVER GREATER THAN 100.5 F  *CHILLS WITH OR WITHOUT FEVER  NAUSEA AND VOMITING THAT IS NOT CONTROLLED WITH YOUR NAUSEA MEDICATION  *UNUSUAL SHORTNESS OF BREATH  *UNUSUAL BRUISING OR BLEEDING  TENDERNESS IN MOUTH AND THROAT WITH OR WITHOUT PRESENCE OF ULCERS  *URINARY PROBLEMS  *BOWEL PROBLEMS  UNUSUAL RASH Items with * indicate a potential emergency and should be followed up as soon as possible.  Feel free to call the clinic you have any questions or concerns. The clinic phone number is [phone].  Please show the [facility] at check-in to the Emergency Department and triage nurse.

## 2015-07-07 NOTE — Telephone Encounter (Signed)
Gave avs & calendar for August °

## 2015-07-07 NOTE — Progress Notes (Signed)
OFFICE PROGRESS NOTE   July 07, 2015   Physicians:D. 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 She has had some improvement and  is in agreement with continuing another 3 cycles as recommended by Dr Josephina Shih, with repeat CT anticipated afterwards.  Patient had cycle 3 carboplatin taxol + cycle 2 avastin on 06-16-15, then repeat CT AP 07-02-15 prior to reevaluation by Dr Josephina Shih 07-04-15. The CT had decrease in size of splenic lesion and pelvic nodes which were PET + by earlier scan; there is likely involvement at posterior stomach also. CA 125 marker, which had been 39 on 04-21-15 was 21 on 06-05-15 and is down to 14 today.  As patient has been so uncomfortable with fatigue during week after chemo, plan is to give IVF with decadron day after treatment and po decadron with food in AMs days 3-4-5. She understands that she may still be fatigued after the steroids complete. She still prefers daily granix to consideration of neulasta. Patient is feeling "okay" today, tho some nausea this AM but did not try antiemetics prior to coming to office for planned treatment. GERD is much better with Protonix, which she will continue daily. She is still bothered by raspy voice despite GERD improved, likely post nasal drainage as she has lots of clear rhinorrhea ongoing; will add OTC Flonase or equivalent to present claritin. Energy is better this week, able to go to Sunday School class "I will not be able to go next Sunday after chemo". Bowels are moving daily with miralax 1-2x daily depending on results. She is upset by expected loss of hair. Phone conversation with [facility] chaplain was helpful "I will have to talk to her again if I have to have more chemo, because I will not be able to stand it";  I have been in communication with chaplain after visit, and she will stay in touch now.   Patient had follow up with Dr Ellyn Hack on 06-25-15, with A fib symptoms better controlled on B blocker dose now and off Rhythmol during chemo. She is on ASA + plavix for stroke prophylaxis from A fib, which we can hold if needed for low platelets. BP has been higher LIKELY IN PART DUE TO AVASTIN, with losartan resumed at 25 mg on 7-27 and increased to 50 mg per Dr Allison Quarry instructions; BP is still up to 160/90 at home and patient will be back in touch with his office about this. I believe that BP was difficult to regulate prior to diagnosis of cancer 2014, and avastin may be exacerbating now as I have discussed with her.    PAC placed by IR 04-21-15 Genetics testing with BreastOvarian panel by [facility] sent 06-26-15, results anticipated in 2-3 weeks. CA 125 on 05-01-15 33 Pain 0  ONCOLOGIC HISTORY Patient presented to Dr Abbie Sons in late Jan 2014 after bright red spotting that AM following 2 weeks of spotting in early Dec 2015. with uterus larger than had been apparent on exam in Oct 2013. Sonohystogram 01-05-13 showed uterus normal size and echotexture, endometrium 4.3 mm, left ovary normal and right adnexa with 1.1 x 8.4.x8.2 cm cystic and solid mass. Endometrial biopsy benign and CA 125 also on 01-05-13 was 178.8. She had CT AP 01-17-13 with 1.0 x 6.9 x 8.9 cm complex right ovarian mass, no ascites, small retroperitoneal nodes. She was  seen by Dr Skeet Latch on 01-18-13 and taken to surgery by Dr Josephina Shih on 02-13-13, which was TAH/BSO/ omentectomy/ureterolysis/ resection of cul-de-sac tumor/right pelvic lymphadnectomy and resection of rectum with reanastomosis. At completion of surgery there was no gross residual disease. 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 Washings [id number]) had rare clusters of atypical cells. Chemotherapy with dose dense taxol carboplatin was begun day 1 cycle 1 on 03-20-13; ANC was 1.1 on day 15 cycle 1 with taxol given and neupogen added 04-04-13. She had day 1 cycle 2 treatment on 04-17-13, then was briefly hospitalized 5-22 to 04-20-13 after syncopal episode, with antihypertensive agents DCd and UTI treated. She was feeling much better at time of "day 8" cycle 2 on 05-01-13 and did have neupogen 300 mcg x 1 dose on 05-02-13. She was readmitted to hospital 6-5 thru 05-05-13 with fever, empirically on antibiotics and blood cultures negative. Cycle 6 completed 08-14-13. CT AP 09-25-13 had no evidence of cancer and CA 125 was 18.3 on 07-24-2013. Marker was 20 in early Dec 2015, 26 on 01-31-15 and 35 on 03-14-15. PET 04-03-15 documented disease bilateral pelvic sidewalls, vaginal cuff and scattered areas thru abdomen/ pelvis, as well as area of uptake in spleen. She resumed carboplatin taxol using q 3 week regimen on 05-01-15. She was neutropenic with ANC 0.3 on day 9 despite beginning granix on day 5. Avastin added with cycle 2 chemo on 05-26-15. CT AP 07-12-15 with improvement in splenic lesion and pelvic adenopathy, still area at posterior stomach, no new involvement; CA 125 down to 14 prior to cycle 4.    Review of systems as above, also: No LE swelling. No SOB. No chest pain. No abdominal or pelvic pain. No bleeding. Appetite better this week. No significant peripheral neuropathy. PAC functioning well. Remainder of 10 point Review of Systems negative.  Objective:  Vital signs in last 24 hours:  BP 171/78 mmHg  Pulse 68  Temp(Src) 98.4 F (36.9 C) (Oral)  Resp 18  Ht 5\' 3"  (1.6 m)  Wt 132 lb 11.2 oz (60.192 kg)  BMI 23.51 kg/m2  SpO2 98% Weight stable Alert, oriented and appropriate. Ambulatory without difficulty.  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 Oral mucosa moist without lesions, posterior pharynx clear.  Neck supple. No JVD.  Lymphatics:no cervical,supraclavicular or inguinal adenopathy Resp: clear to auscultation bilaterally and normal percussion bilaterally Cardio: regular rate and rhythm. No gallop. GI: soft, nontender, not distended, no mass or organomegaly. Normally active bowel sounds. Surgical incision not remarkable. Musculoskeletal/ Extremities: without pitting edema, cords, tenderness.  Neuro: no increased peripheral neuropathy. Otherwise nonfocal. PSYCH mood and affect some better today Skin without rash, ecchymosis, petechiae Portacath-without erythema or tenderness, incision well healed.  Lab Results:  Results for orders placed or performed in visit on 07/07/15  CBC with Differential  Result Value Ref Range   WBC 6.4 3.9 - 10.3 10e3/uL   NEUT# 5.8 1.5 - 6.5 10e3/uL   HGB 11.6 11.6 - 15.9 g/dL   HCT 34.5 (L) 34.8 - 46.6 %   Platelets 149 145 - 400 10e3/uL   MCV 100.0 79.5 - 101.0 fL   MCH 33.6 25.1 - 34.0 pg   MCHC 33.6 31.5 - 36.0 g/dL   RBC 3.45 (L) 3.70 - 5.45 10e6/uL  RDW 15.2 (H) 11.2 - 14.5 %   lymph# 0.6 (L) 0.9 - 3.3 10e3/uL   MONO# 0.0 (L) 0.1 - 0.9 10e3/uL   Eosinophils Absolute 0.0 0.0 - 0.5 10e3/uL   Basophils Absolute 0.0 0.0 - 0.1 10e3/uL   NEUT% 90.4 (H) 38.4 - 76.8 %   LYMPH% 8.8 (L) 14.0 - 49.7 %   MONO% 0.6 0.0 - 14.0 %   EOS% 0.0 0.0 - 7.0 %   BASO% 0.2 0.0 - 2.0 %    CMET available prior to treatment today has glucose 221 on steroids, creat 0.7, other electrolytes ok, T bili 0.52, other LFTs good, T prot 6.1  CA 125 available after visit now 14.  Studies/Results: CT ABDOMEN AND PELVIS WITH CONTRAST  07-02-15  COMPARISON: PET-CT 04/03/2015  FINDINGS: Lower chest: The lung bases are clear. No worrisome pulmonary lesions or pleural effusion. The heart is normal in size. No pericardial effusion. Stable small hiatal  hernia.  Hepatobiliary: No focal hepatic lesions or intrahepatic biliary dilatation. No peritoneal surface lesions are identified. The gallbladder is normal. No common bile duct dilatation.  Pancreas: No mass, inflammation or ductal dilatation.  Spleen: The splenic lesion is slightly smaller. It previously measured a maximum of 4.0 x 2.1 cm and now measures 3.0 x 1.4 cm. Adjacent abnormal thickening of the posterior stomach wall is also likely tumor.  Adrenals/Urinary Tract: The adrenal glands are normal and stable. Both kidneys are unremarkable and unchanged.  Stomach/Bowel: The posterior wall of the fundal region the stomach is abnormal and likely involved with tumor adjacent to the splenic tumor. The remainder the stomach, the duodenum, small bowel and colon are unremarkable. No inflammatory changes, mass lesions or obstructive findings.  Vascular/Lymphatic: Small scattered mesenteric and retroperitoneal lymph nodes appear stable. I do not see any measurable adenopathy. The aorta and branch vessels are patent. Stable atherosclerotic calcifications.  Other: The metabolically active pelvic nodes are smaller. Minimal residual matted soft tissue density without discrete measurable nodes as demonstrated on the prior study. The bladder is normal. No inguinal mass or adenopathy.  Musculoskeletal: No significant bony findings.  IMPRESSION: 1. Decrease in size of these splenic lesion. Suspect tumor also involving the adjacent posterior wall of the stomach. 2. Improved pelvic lymphadenopathy. 3. No new omental or peritoneal surface disease.  PACs images reviewed by MD prior to this visit. Results discussed again with patient now.   Medications: I have reviewed the patient's current medications. Try flonase or nasacort OTC in addition to claritin for rhinorrhea and post nasal drainage. Continue miralax qd - bid to keep bowels moving well daily. Decadron in IVF day 2 and  po with food days 3-4-5 for this cycle. Continue daily protonix. Granix days 2-6. B blocker, ASA, plavix as above; losartan per Dr Ellyn Hack. OK to use tylenol for fever shortly after chemo, as this has been her pattern (tho may not occur with decadron). Day 2: NS one liter, zofran 20 mg, decadron 4 mg, pepcid 20 mg.  DISCUSSION: as noted above. I have again told patient that we will try to make treatment as tolerable as possible, tho she will have some side effects even under best conditions. She understands that she can stop this chemo or avastin at any time if she prefers. I have told her that physicians are encouraged by the improvement on scans and hope that additional treatment with same regimen will give further improvement. She understands plan for the IVF and decadron beginning 07-08-15.  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 Gradual increase in CA125 March and April 2016 and PET evidence of involvement pelvis and abdomen, asymptomatic. Resumed chemotherapy with carboplatin and taxol using q 3 week dosing, cycle 1 on 05-01-15. Addition of avastin with cycle 2 on 05-26-15. IVF day 2 and granix x 5 beginnig day 2, and additional decadron days 3,4,5 with this cycle. Improvement by CT 07-02-15 and CA 125 lower. Gyn onc in agreement with continuing present regimen for 3 more cycles then repeat imaging, patient agrees. [place] support staff involved. 2.PAC placement by IR 04-21-15 : delayed start of avastin until cycle 2 3.HTN: likely exacerbated now by Avastin. Losartan resumed in last 2 weeks. Patient keeping log of BPs and will call Dr Allison Quarry office to follow up if these remain elevated.  4.paroxysmal Afib: followed by Dr Ellyn Hack of cardiology, on B blocker now. Note on plavix and ASA if platelets drop with chemo.   5.chronic constipation requiring increased laxatives with adjuvant chemo, up to date on colonoscopy. Miralax as above 6.hypothyroid on replacement by PCP 7. Left knee replacement 12-2014, doing well. Right hip replacement 2013 for osteoarthritis 8. Long estrogen replacement, which she still uses. She has previously refused to discontinue the estrogen. Initial surgical path from 02-13-13 does not report ER PR testing. 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 Breast tissue heterogeneously dense by last tomo mammograms [facility] 03[facility]21-15, those mammograms otherwise negative.  10.. Mouth symptoms resolved: now using Biotene regularly 11. GERD: better with protonix, continue 12  Allergic rhinorrhea with post nasal drainage despite claritin: add OTC flonase or nasacort   All questions answered to her satisfaction. Dr Tamela Oddi and Dr Allison Quarry assistance appreciated. She knows that she can call at any time if needed prior to next scheduled appointment. I will see her with labs 8-22 and 07-28-15. Chemo, IVF, IV meds,, granix orders confirmed. Time spent 35 min including >50% counseling and coordination of care. CC Dr Ellyn Hack and Dr Esaw Dace, MD   07/07/2015, 8:55 AM

## 2015-07-08 ENCOUNTER — Encounter (HOSPITAL_COMMUNITY): Payer: Self-pay

## 2015-07-08 ENCOUNTER — Ambulatory Visit (HOSPITAL_BASED_OUTPATIENT_CLINIC_OR_DEPARTMENT_OTHER): Payer: Medicare Other

## 2015-07-08 ENCOUNTER — Ambulatory Visit: Payer: Medicare Other

## 2015-07-08 VITALS — BP 145/68 | HR 63 | Temp 97.9°F | Resp 18

## 2015-07-08 DIAGNOSIS — C7989 Secondary malignant neoplasm of other specified sites: Secondary | ICD-10-CM | POA: Diagnosis not present

## 2015-07-08 DIAGNOSIS — C561 Malignant neoplasm of right ovary: Secondary | ICD-10-CM | POA: Diagnosis not present

## 2015-07-08 DIAGNOSIS — C562 Malignant neoplasm of left ovary: Secondary | ICD-10-CM

## 2015-07-08 DIAGNOSIS — C569 Malignant neoplasm of unspecified ovary: Secondary | ICD-10-CM

## 2015-07-08 DIAGNOSIS — R971 Elevated cancer antigen 125 [CA 125]: Secondary | ICD-10-CM

## 2015-07-08 DIAGNOSIS — Z5111 Encounter for antineoplastic chemotherapy: Secondary | ICD-10-CM

## 2015-07-08 DIAGNOSIS — K219 Gastro-esophageal reflux disease without esophagitis: Secondary | ICD-10-CM

## 2015-07-08 MED ORDER — FAMOTIDINE IN NACL 20-0.9 MG/50ML-% IV SOLN
20.0000 mg | Freq: Once | INTRAVENOUS | Status: AC
  Administered 2015-07-08: 20 mg via INTRAVENOUS

## 2015-07-08 MED ORDER — HEPARIN SOD (PORK) LOCK FLUSH 100 UNIT/ML IV SOLN
500.0000 [IU] | Freq: Once | INTRAVENOUS | Status: AC
  Administered 2015-07-08: 500 [IU] via INTRAVENOUS
  Filled 2015-07-08: qty 5

## 2015-07-08 MED ORDER — SODIUM CHLORIDE 0.9 % IV SOLN
Freq: Once | INTRAVENOUS | Status: AC
  Administered 2015-07-08: 13:00:00 via INTRAVENOUS
  Filled 2015-07-08: qty 4

## 2015-07-08 MED ORDER — FAMOTIDINE IN NACL 20-0.9 MG/50ML-% IV SOLN
INTRAVENOUS | Status: AC
  Filled 2015-07-08: qty 50

## 2015-07-08 MED ORDER — SODIUM CHLORIDE 0.9 % IV SOLN
INTRAVENOUS | Status: DC
  Administered 2015-07-08: 13:00:00 via INTRAVENOUS

## 2015-07-08 MED ORDER — SODIUM CHLORIDE 0.9 % IJ SOLN
10.0000 mL | INTRAMUSCULAR | Status: DC | PRN
  Administered 2015-07-08: 10 mL via INTRAVENOUS
  Filled 2015-07-08: qty 10

## 2015-07-08 MED ORDER — TBO-FILGRASTIM 300 MCG/0.5ML ~~LOC~~ SOSY
300.0000 ug | PREFILLED_SYRINGE | Freq: Once | SUBCUTANEOUS | Status: AC
  Administered 2015-07-08: 300 ug via SUBCUTANEOUS
  Filled 2015-07-08: qty 0.5

## 2015-07-08 NOTE — Patient Instructions (Signed)
Dehydration, Adult Dehydration is when you lose more fluids from the body than you take in. Vital organs like the kidneys, brain, and heart cannot function without a proper amount of fluids and salt. Any loss of fluids from the body can cause dehydration.  CAUSES   Vomiting.  Diarrhea.  Excessive sweating.  Excessive urine output.  Fever. SYMPTOMS  Mild dehydration  Thirst.  Dry lips.  Slightly dry mouth. Moderate dehydration  Very dry mouth.  Sunken eyes.  Skin does not bounce back quickly when lightly pinched and released.  Dark urine and decreased urine production.  Decreased tear production.  Headache. Severe dehydration  Very dry mouth.  Extreme thirst.  Rapid, weak pulse (more than 100 beats per minute at rest).  Cold hands and feet.  Not able to sweat in spite of heat and temperature.  Rapid breathing.  Blue lips.  Confusion and lethargy.  Difficulty being awakened.  Minimal urine production.  No tears. DIAGNOSIS  Your caregiver will diagnose dehydration based on your symptoms and your exam. Blood and urine tests will help confirm the diagnosis. The diagnostic evaluation should also identify the cause of dehydration. TREATMENT  Treatment of mild or moderate dehydration can often be done at home by increasing the amount of fluids that you drink. It is best to drink small amounts of fluid more often. Drinking too much at one time can make vomiting worse. Refer to the home care instructions below. Severe dehydration needs to be treated at the hospital where you will probably be given intravenous (IV) fluids that contain water and electrolytes. HOME CARE INSTRUCTIONS   Ask your caregiver about specific rehydration instructions.  Drink enough fluids to keep your urine clear or pale yellow.  Drink small amounts frequently if you have nausea and vomiting.  Eat as you normally do.  Avoid:  Foods or drinks high in sugar.  Carbonated  drinks.  Juice.  Extremely hot or cold fluids.  Drinks with caffeine.  Fatty, greasy foods.  Alcohol.  Tobacco.  Overeating.  Gelatin desserts.  Wash your hands well to avoid spreading bacteria and viruses.  Only take over-the-counter or prescription medicines for pain, discomfort, or fever as directed by your caregiver.  Ask your caregiver if you should continue all prescribed and over-the-counter medicines.  Keep all follow-up appointments with your caregiver. SEEK MEDICAL CARE IF:  You have abdominal pain and it increases or stays in one area (localizes).  You have a rash, stiff neck, or severe headache.  You are irritable, sleepy, or difficult to awaken.  You are weak, dizzy, or extremely thirsty. SEEK IMMEDIATE MEDICAL CARE IF:   You are unable to keep fluids down or you get worse despite treatment.  You have frequent episodes of vomiting or diarrhea.  You have blood or green matter (bile) in your vomit.  You have blood in your stool or your stool looks black and tarry.  You have not urinated in 6 to 8 hours, or you have only urinated a small amount of very dark urine.  You have a fever.  You faint. MAKE SURE YOU:   Understand these instructions.  Will watch your condition.  Will get help right away if you are not doing well or get worse. Document Released: 11/15/2005 Document Revised: 02/07/2012 Document Reviewed: 07/05/2011 [employer] Patient Information 2015 [employer]. This information is not intended to replace advice given to you by your health care provider. Make sure you discuss any questions you have with your health care   provider.  

## 2015-07-08 NOTE — Progress Notes (Signed)
Granix injection given by infusion nurse while receiving fluids

## 2015-07-09 ENCOUNTER — Ambulatory Visit (HOSPITAL_BASED_OUTPATIENT_CLINIC_OR_DEPARTMENT_OTHER): Payer: Medicare Other

## 2015-07-09 VITALS — BP 141/69 | HR 62 | Temp 98.5°F

## 2015-07-09 DIAGNOSIS — R971 Elevated cancer antigen 125 [CA 125]: Secondary | ICD-10-CM

## 2015-07-09 DIAGNOSIS — C562 Malignant neoplasm of left ovary: Secondary | ICD-10-CM | POA: Diagnosis not present

## 2015-07-09 DIAGNOSIS — C569 Malignant neoplasm of unspecified ovary: Secondary | ICD-10-CM

## 2015-07-09 DIAGNOSIS — C561 Malignant neoplasm of right ovary: Secondary | ICD-10-CM

## 2015-07-09 DIAGNOSIS — Z5111 Encounter for antineoplastic chemotherapy: Secondary | ICD-10-CM

## 2015-07-09 MED ORDER — TBO-FILGRASTIM 300 MCG/0.5ML ~~LOC~~ SOSY
300.0000 ug | PREFILLED_SYRINGE | Freq: Once | SUBCUTANEOUS | Status: AC
  Administered 2015-07-09: 300 ug via SUBCUTANEOUS
  Filled 2015-07-09: qty 0.5

## 2015-07-10 ENCOUNTER — Ambulatory Visit: Payer: Medicare Other | Admitting: Oncology

## 2015-07-10 ENCOUNTER — Other Ambulatory Visit: Payer: Medicare Other

## 2015-07-10 ENCOUNTER — Ambulatory Visit (HOSPITAL_BASED_OUTPATIENT_CLINIC_OR_DEPARTMENT_OTHER): Payer: Medicare Other

## 2015-07-10 ENCOUNTER — Ambulatory Visit: Payer: Medicare Other

## 2015-07-10 VITALS — BP 155/77 | HR 75 | Temp 99.8°F

## 2015-07-10 DIAGNOSIS — R971 Elevated cancer antigen 125 [CA 125]: Secondary | ICD-10-CM

## 2015-07-10 DIAGNOSIS — C569 Malignant neoplasm of unspecified ovary: Secondary | ICD-10-CM

## 2015-07-10 DIAGNOSIS — C561 Malignant neoplasm of right ovary: Secondary | ICD-10-CM

## 2015-07-10 DIAGNOSIS — Z5111 Encounter for antineoplastic chemotherapy: Secondary | ICD-10-CM

## 2015-07-10 DIAGNOSIS — C562 Malignant neoplasm of left ovary: Secondary | ICD-10-CM | POA: Diagnosis not present

## 2015-07-10 MED ORDER — TBO-FILGRASTIM 300 MCG/0.5ML ~~LOC~~ SOSY
300.0000 ug | PREFILLED_SYRINGE | Freq: Once | SUBCUTANEOUS | Status: AC
  Administered 2015-07-10: 300 ug via SUBCUTANEOUS
  Filled 2015-07-10: qty 0.5

## 2015-07-11 ENCOUNTER — Ambulatory Visit (HOSPITAL_BASED_OUTPATIENT_CLINIC_OR_DEPARTMENT_OTHER): Payer: Medicare Other

## 2015-07-11 ENCOUNTER — Other Ambulatory Visit: Payer: Self-pay

## 2015-07-11 ENCOUNTER — Other Ambulatory Visit: Payer: Self-pay | Admitting: *Deleted

## 2015-07-11 VITALS — BP 156/78 | HR 71 | Temp 98.4°F

## 2015-07-11 DIAGNOSIS — D701 Agranulocytosis secondary to cancer chemotherapy: Secondary | ICD-10-CM

## 2015-07-11 DIAGNOSIS — R971 Elevated cancer antigen 125 [CA 125]: Secondary | ICD-10-CM

## 2015-07-11 DIAGNOSIS — C569 Malignant neoplasm of unspecified ovary: Secondary | ICD-10-CM | POA: Diagnosis not present

## 2015-07-11 DIAGNOSIS — Z5111 Encounter for antineoplastic chemotherapy: Secondary | ICD-10-CM

## 2015-07-11 MED ORDER — ONDANSETRON HCL 8 MG PO TABS: 8.0000 mg | ORAL_TABLET | Freq: Three times a day (TID) | ORAL | Status: DC | PRN

## 2015-07-11 MED ORDER — TBO-FILGRASTIM 300 MCG/0.5ML ~~LOC~~ SOSY
300.0000 ug | PREFILLED_SYRINGE | Freq: Once | SUBCUTANEOUS | Status: AC
  Administered 2015-07-11: 300 ug via SUBCUTANEOUS
  Filled 2015-07-11: qty 0.5

## 2015-07-12 ENCOUNTER — Ambulatory Visit (HOSPITAL_BASED_OUTPATIENT_CLINIC_OR_DEPARTMENT_OTHER): Payer: Medicare Other

## 2015-07-12 VITALS — BP 156/77 | HR 68 | Temp 97.9°F | Resp 18

## 2015-07-12 DIAGNOSIS — Z5189 Encounter for other specified aftercare: Secondary | ICD-10-CM | POA: Diagnosis not present

## 2015-07-12 DIAGNOSIS — C562 Malignant neoplasm of left ovary: Secondary | ICD-10-CM | POA: Diagnosis not present

## 2015-07-12 DIAGNOSIS — C569 Malignant neoplasm of unspecified ovary: Secondary | ICD-10-CM

## 2015-07-12 DIAGNOSIS — Z5111 Encounter for antineoplastic chemotherapy: Secondary | ICD-10-CM

## 2015-07-12 DIAGNOSIS — R971 Elevated cancer antigen 125 [CA 125]: Secondary | ICD-10-CM

## 2015-07-12 DIAGNOSIS — C561 Malignant neoplasm of right ovary: Secondary | ICD-10-CM

## 2015-07-12 MED ORDER — TBO-FILGRASTIM 300 MCG/0.5ML ~~LOC~~ SOSY
300.0000 ug | PREFILLED_SYRINGE | Freq: Once | SUBCUTANEOUS | Status: AC
  Administered 2015-07-12: 300 ug via SUBCUTANEOUS

## 2015-07-14 ENCOUNTER — Ambulatory Visit: Payer: Self-pay | Admitting: Genetic Counselor

## 2015-07-14 ENCOUNTER — Telehealth: Payer: Self-pay | Admitting: Cardiology

## 2015-07-14 ENCOUNTER — Telehealth: Payer: Self-pay | Admitting: Genetic Counselor

## 2015-07-14 DIAGNOSIS — Z1379 Encounter for other screening for genetic and chromosomal anomalies: Secondary | ICD-10-CM | POA: Insufficient documentation

## 2015-07-14 DIAGNOSIS — C569 Malignant neoplasm of unspecified ovary: Secondary | ICD-10-CM

## 2015-07-14 NOTE — Telephone Encounter (Signed)
Pt called in stating that she was instructed to call and report how she had been doing since she had been on Losartan. Please f/u with her  Thanks

## 2015-07-14 NOTE — Telephone Encounter (Signed)
Spoke to patient Blood pressure reading since last visit She is concerned blood pressure has not come down since starting losartan 50 mg  07/07/15  160/89 07/08/15   134/74 07/09/15  141/69 07/10/15   149/82 07/11/15    153/82 07/12/15    143/76 07/13/15     167/90 07/14/15     169/106  And in the afternoon 160/87 Informed patient will review with Dr Ellyn Hack on his return to office.he will be out until 07/23/15. RN reassured patient. Patient verbalized understanding.

## 2015-07-14 NOTE — Telephone Encounter (Signed)
Discussed with Ms. Maiorana that her genetic test results were negative for mutations within any of 20 genes related to increased risks for ovarian, breast, and other related cancers.  Additionally, no uncertain changes were found.  Though we still do not have an explanation for Ms. Maina's ovarian cancer or for the family history of prostate cancer, this result is reassuring in that it suggests that Ms. Westling's ovarian cancer was likely a sporadic cancer.  We discussed that this result cannot totally rule out a hereditary cancer syndrome, as we may have even more genes we are able to analyze in the future--she is always welcome to call us back to find out about updated testing options in the future.  We also discussed that any future cancer screening for herself or her relatives would just be based on the personal/family history of cancer.  Thus, her daughters may be considered to be at a somewhat increased risk for ovarian cancer just based on Ms. Magwood's history.  One of her daughters has already had a TAH-BSO and her other daughter has one scheduled in the near future.  They seem to already be doing as much as possible to reduce these risks.  Ms. Tabor is welcome to call me with any further questions.

## 2015-07-14 NOTE — Progress Notes (Signed)
GENETIC TEST RESULTS  HPI: [name] was previously seen in the [place] [facility] due to a personal history of ovarian cancer, family history of prostate cancer, and concerns regarding a hereditary predisposition to cancer. Please refer to our prior cancer genetics clinic note from June 26, 2015 for more information regarding [name]'s medical, social and family histories, and our assessment and recommendations, at the time. [name] recent genetic test results were disclosed to her, as were recommendations warranted by these results. These results and recommendations are discussed in more detail below.  GENETIC TEST RESULTS: At the time of [name]'s visit on 06/26/15, we recommended she pursue genetic testing of the 20-gene Breast/Ovarian Cancer Panel through [facility] Tina Pigeon, MD).  The Breast/Ovarian Cancer Panel offered by [facility] includes sequencing and deletion/duplication analysis for the following 19 genes:  ATM, BARD1, BRCA1, BRCA2, BRIP1, CDH1, CHEK2, FANCC, MLH1, MSH2, MSH6, NBN, PALB2, PMS2, PTEN, RAD51C, RAD51D, TP53, and XRCC2.  This panel also includes deletion/duplication analysis (without sequencing) for one gene, EPCAM.  Those results are now back, the report date of is July 11, 2015.  Genetic testing was normal, and did not reveal a deleterious mutation in these genes.  Additionally, no variants of uncertain significance (VUSs) were found.  The test report will be scanned into EPIC and will be located under the Results Review tab in the Surgical Pathology>Molecular Pathology section.   We discussed with [name] that since the current genetic testing is not perfect, it is possible there may be a gene mutation in one of these genes that current testing cannot detect, but that chance is small. We also discussed, that it is possible that another gene that has not yet been discovered, or that we have not yet tested, is responsible  for the cancer diagnoses in the family, and it is, therefore, important to remain in touch with cancer genetics in the future so that we can continue to offer [name] the most up to date genetic testing.   CANCER SCREENING RECOMMENDATIONS: While we still do not have an explanation for why [name] was diagnosed with ovarian cancer, this result is reassuring and indicates that [name] likely does not have an increased risk for a future cancer due to a mutation in one of these genes. This normal test also suggests that [name]'s cancer was most likely not due to an inherited predisposition associated with one of these genes.  Most cancers happen by chance and this negative test suggests that her cancer falls into this category.  We, therefore, recommended she continue to follow the cancer management and screening guidelines provided by her oncology and primary healthcare providers.   RECOMMENDATIONS FOR FAMILY MEMBERS: Women in this family might be at some increased risk of developing cancer, over the general population risk, simply due to the family history of cancer. In particular, [name] may be at increased risk for ovarian cancer just based on her history.  We discussed that one of her daughters has already undergone a TAH-BSO and that her other daughter plans to have one soon.  Thus, her daughters are already greatly reducing their risks/planning to greatly reduce their risks.  Women in this family should also have a gynecological exam as recommended by their primary provider. All family members should have a colonoscopy by age 79.  FOLLOW-UP: Lastly, we discussed with [name] that cancer genetics is a rapidly advancing field and it is possible that new genetic  tests will be appropriate for her and/or her family members in the future. We encouraged her to remain in contact with cancer genetics on an annual basis so we can update her personal and family  histories and let her know of advances in cancer genetics that may benefit this family.   Our contact number was provided. [name] questions were answered to her satisfaction, and she knows she is welcome to call us at anytime with additional questions or concerns.   Jeanine Luz, MS Genetic Counselor [email or web address] Phone: [phone]

## 2015-07-15 ENCOUNTER — Other Ambulatory Visit: Payer: Self-pay | Admitting: Oncology

## 2015-07-15 ENCOUNTER — Telehealth: Payer: Self-pay | Admitting: *Deleted

## 2015-07-15 NOTE — Telephone Encounter (Signed)
LM in Ms. Musick's home phone to call cardiologist or PCP and ask for her BP readings and recommendation by Dr. Marko Plume to increase Cozaar to 50 mg bid be reviewed with the on call physician at either practice and call the [facility] tomorrow  with recommendations and further update on BP readings.

## 2015-07-15 NOTE — Telephone Encounter (Signed)
Re triage note higher blood pressures  I think cozaar dose could be increased to 50 mg bid, but I would prefer cardiologist or PCP Dr Virgina Jock confirm this. As her main cardiologist is away this week, please ask her to call PCP Dr Virgina Jock now.  Please also have her call back to our office tomorrow to let us know what Dr Virgina Jock suggests and to let us know her BP readings then. Next avastin not until after I see her again. Avastin may be increasing her BP now.  Please route or otherwise send triage note today + this response to Dr Virgina Jock.

## 2015-07-15 NOTE — Telephone Encounter (Signed)
ON 07/14/15 B/P 169/106. ON 07/15/15 B/P 173/93. ON 07/14/15 PT. NOTIFIED DR.HARDING'S NURSE OF HER ELEVATED BLOOD PRESSURE READINGS. DR.HARDING'S NURSE INFORM PT. DR.HARDING IS OUT OF THE OFFICE UNTIL 07/23/15. SHE WILL REVIEW THE BLOOD PRESSURE READINGS WITH DR.HARDING UPON HIS RETURN TO THE OFFICE. INSTRUCTED PT. TO TAKE HER BLOOD PRESSURE TWICE A DAY AND KEEP DR.HARDING'S OFFICE UPDATED IF IT IS ELEVATED.

## 2015-07-16 NOTE — Telephone Encounter (Signed)
Pt's blood pressure is still up.Her Oncologist thinks it is one of her Chemo medicine that is causing her pressure to be up. She thinks her Losartan should be increased.

## 2015-07-16 NOTE — Telephone Encounter (Signed)
Agree with increasing losartan to 50mg  bid.  Please ask her to keep checking BP at home and call back if it continues to be uncontrolled.

## 2015-07-17 MED ORDER — LOSARTAN POTASSIUM 50 MG PO TABS: 50.0000 mg | ORAL_TABLET | Freq: Two times a day (BID) | ORAL | Status: DC

## 2015-07-17 NOTE — Telephone Encounter (Signed)
Spoke w/ patient this AM. Discussed Dr. Blenda Mounts recommendations for dosage increase on cozaar. Pt requested refill w/ new dose sent to her pharmacy - this was done.   Advised pt to continue to monitor BPs, let us know if BP drops too much/symptomatic, can advise on fine tuning BP meds further if req'd. Pt voiced understanding.

## 2015-07-20 ENCOUNTER — Other Ambulatory Visit: Payer: Self-pay | Admitting: Oncology

## 2015-07-21 ENCOUNTER — Other Ambulatory Visit (HOSPITAL_BASED_OUTPATIENT_CLINIC_OR_DEPARTMENT_OTHER): Payer: Medicare Other

## 2015-07-21 ENCOUNTER — Encounter: Payer: Self-pay | Admitting: Oncology

## 2015-07-21 ENCOUNTER — Ambulatory Visit (HOSPITAL_BASED_OUTPATIENT_CLINIC_OR_DEPARTMENT_OTHER): Payer: Medicare Other | Admitting: Oncology

## 2015-07-21 VITALS — BP 186/82 | HR 64 | Temp 98.0°F | Resp 18 | Ht 63.0 in | Wt 133.4 lb

## 2015-07-21 DIAGNOSIS — C562 Malignant neoplasm of left ovary: Secondary | ICD-10-CM

## 2015-07-21 DIAGNOSIS — C7989 Secondary malignant neoplasm of other specified sites: Secondary | ICD-10-CM

## 2015-07-21 DIAGNOSIS — T50905A Adverse effect of unspecified drugs, medicaments and biological substances, initial encounter: Secondary | ICD-10-CM

## 2015-07-21 DIAGNOSIS — C569 Malignant neoplasm of unspecified ovary: Secondary | ICD-10-CM

## 2015-07-21 DIAGNOSIS — C561 Malignant neoplasm of right ovary: Secondary | ICD-10-CM

## 2015-07-21 DIAGNOSIS — T451X5A Adverse effect of antineoplastic and immunosuppressive drugs, initial encounter: Secondary | ICD-10-CM

## 2015-07-21 DIAGNOSIS — I10 Essential (primary) hypertension: Secondary | ICD-10-CM

## 2015-07-21 DIAGNOSIS — I158 Other secondary hypertension: Secondary | ICD-10-CM

## 2015-07-21 DIAGNOSIS — E039 Hypothyroidism, unspecified: Secondary | ICD-10-CM

## 2015-07-21 DIAGNOSIS — K219 Gastro-esophageal reflux disease without esophagitis: Secondary | ICD-10-CM

## 2015-07-21 DIAGNOSIS — C563 Malignant neoplasm of bilateral ovaries: Secondary | ICD-10-CM

## 2015-07-21 DIAGNOSIS — G620 Drug-induced polyneuropathy: Secondary | ICD-10-CM | POA: Diagnosis not present

## 2015-07-21 DIAGNOSIS — D701 Agranulocytosis secondary to cancer chemotherapy: Secondary | ICD-10-CM

## 2015-07-21 DIAGNOSIS — Z5111 Encounter for antineoplastic chemotherapy: Secondary | ICD-10-CM

## 2015-07-21 DIAGNOSIS — J3489 Other specified disorders of nose and nasal sinuses: Secondary | ICD-10-CM

## 2015-07-21 DIAGNOSIS — Z95828 Presence of other vascular implants and grafts: Secondary | ICD-10-CM

## 2015-07-21 LAB — CBC WITH DIFFERENTIAL/PLATELET
BASO%: 1.5 % (ref 0.0–2.0)
BASOS ABS: 0.1 10*3/uL (ref 0.0–0.1)
EOS%: 0.3 % (ref 0.0–7.0)
Eosinophils Absolute: 0 10*3/uL (ref 0.0–0.5)
HEMATOCRIT: 34 % — AB (ref 34.8–46.6)
HGB: 11.6 g/dL (ref 11.6–15.9)
LYMPH#: 1.9 10*3/uL (ref 0.9–3.3)
LYMPH%: 41.8 % (ref 14.0–49.7)
MCH: 33.8 pg (ref 25.1–34.0)
MCHC: 34 g/dL (ref 31.5–36.0)
MCV: 99.2 fL (ref 79.5–101.0)
MONO#: 0.5 10*3/uL (ref 0.1–0.9)
MONO%: 11.6 % (ref 0.0–14.0)
NEUT#: 2.1 10*3/uL (ref 1.5–6.5)
NEUT%: 44.8 % (ref 38.4–76.8)
Platelets: 186 10*3/uL (ref 145–400)
RBC: 3.43 10*6/uL — ABNORMAL LOW (ref 3.70–5.45)
RDW: 14.8 % — ABNORMAL HIGH (ref 11.2–14.5)
WBC: 4.6 10*3/uL (ref 3.9–10.3)

## 2015-07-21 LAB — COMPREHENSIVE METABOLIC PANEL (CC13)
ALK PHOS: 65 U/L (ref 40–150)
ALT: 12 U/L (ref 0–55)
AST: 14 U/L (ref 5–34)
Albumin: 3.6 g/dL (ref 3.5–5.0)
Anion Gap: 7 mEq/L (ref 3–11)
BUN: 10.8 mg/dL (ref 7.0–26.0)
CALCIUM: 9.1 mg/dL (ref 8.4–10.4)
CHLORIDE: 104 meq/L (ref 98–109)
CO2: 29 mEq/L (ref 22–29)
Creatinine: 0.6 mg/dL (ref 0.6–1.1)
EGFR: >90 mL/min/{1.73_m2} (ref >90)
Glucose: 90 mg/dl (ref 70–140)
POTASSIUM: 4.1 meq/L (ref 3.5–5.1)
Sodium: 140 mEq/L (ref 136–145)
Total Bilirubin: 0.55 mg/dL (ref 0.20–1.20)
Total Protein: 5.9 g/dL — ABNORMAL LOW (ref 6.4–8.3)

## 2015-07-21 LAB — UA PROTEIN, DIPSTICK - CHCC: PROTEIN: NEGATIVE mg/dL

## 2015-07-21 NOTE — Progress Notes (Signed)
OFFICE PROGRESS NOTE   July 21, 2015   Physicians:D. 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 As blood pressure remains high, likely in part from avastin, will hold avastin on 07-28-15 and  patient will follow up with cardiology and/ or Dr Virgina Jock. Otherwise she tolerated cycle 4 chemo much better with addition of decadron x 4 days after treatment (IV decadron 4 mg on day 2, then 4 mg po days 3-4-5). She had granix x4 beginning day 2. Plan repeat scans after cycle 6 chemo with gyn oncology follow up then.  Patient reports much less of the debilitating fatigue with additional decadron after chemo, and only mild drop in energy after completing the few days of extra decadron after chemo. She had no significant aches, also took claritin daily. Again this cycle she had some uncomfortable constipation, tho bowels still moved almost daily, but did not increase laxatives until bowels were already symptomatic; discussed increasing bowel regimen beginning day of treatment. She had no significant nausea, was able to eat and drink. No mucositis symptoms. Blood pressures have remained with diastolics [id number]/ [id number] this AM 152/106 with repeat at home 153/90. She has been on losartan 50 mg bid since 07-17-15. She has no HA, no chest pain, no increased Afib symptoms. She has minimal intermittent numbness in tips of fingers which is not bothersome, no change in peripheral neuropathy in feet also not bothersome. She has marked restless extremities with benadryl 50 mg with taxol premeds, will decrease this to 25 mg. No bleeding, no LE swelling, no SOB.     PAC placed by IR 04-21-15 Genetics testing with [facility]varian  panel by [facility] sent 06-26-15, results anticipated in 2-3 weeks. CA 125 on 05-01-15 33     ONCOLOGIC HISTORY Patient presented to Dr Abbie Sons in late Jan 2014 after bright red spotting that AM following 2 weeks of spotting in early Dec 2015. with uterus larger than had been apparent on exam in Oct 2013. Sonohystogram 01-05-13 showed uterus normal size and echotexture, endometrium 4.3 mm, left ovary normal and right adnexa with 1.1 x 8.4.x8.2 cm cystic and solid mass. Endometrial biopsy benign and CA 125 also on 01-05-13 was 178.8. She had CT AP 01-17-13 with 1.0 x 6.9 x 8.9 cm complex right ovarian mass, no ascites, small retroperitoneal nodes. She was seen by Dr Skeet Latch on 01-18-13 and taken to surgery by Dr Josephina Shih on 02-13-13, which was TAH/BSO/ omentectomy/ureterolysis/ resection of cul-de-sac tumor/right pelvic lymphadnectomy and resection of rectum with reanastomosis. At completion of surgery there was no gross residual disease. 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 [place] [phone]) had rare clusters of atypical cells. Chemotherapy with dose dense taxol carboplatin was begun day 1 cycle 1 on 03-20-13; ANC was 1.1 on day 15 cycle 1 with taxol given and neupogen added 04-04-13. She had day 1 cycle 2 treatment on 04-17-13, then was briefly hospitalized 5-22 to 04-20-13 after syncopal episode, with antihypertensive agents DCd and UTI treated. She was feeling much better at time of "day 8" cycle 2 on 05-01-13 and  did have neupogen 300 mcg x 1 dose on 05-02-13. She was readmitted to hospital 6-5 thru 05-05-13 with fever, empirically on antibiotics and blood cultures negative. Cycle 6 completed 08-14-13. CT AP 09-25-13 had no evidence of cancer and CA 125  was 18.3 on 07-24-2013. Marker was 20 in early Dec 2015, 26 on 01-31-15 and 35 on 03-14-15. PET 04-03-15 documented disease bilateral pelvic sidewalls, vaginal cuff and scattered areas thru abdomen/ pelvis, as well as area of uptake in spleen. She resumed carboplatin taxol using q 3 week regimen on 05-01-15. She was neutropenic with ANC 0.3 on day 9 despite beginning granix on day 5. Avastin added with cycle 2 chemo on 05-26-15. CT AP 07-12-15 with improvement in splenic lesion and pelvic adenopathy, still area at posterior stomach, no new involvement; CA 125 down to 14 prior to cycle 4.     Review of systems as above, also: No fever or symptoms of infection. No problems with PAC. Remainder of 10 point Review of Systems negative.  Objective:  Vital signs in last 24 hours:  BP 186/82 mmHg  Pulse 64  Temp(Src) 98 F (36.7 C) (Oral)  Resp 18  Ht 5' 3"  (1.6 m)  Wt 133 lb 6.4 oz (60.51 kg)  BMI 23.64 kg/m2  SpO2 100% Weight up 1 lb.  Looks comfortable Alert, oriented and appropriate. Ambulatory without assistance, easily mobile, able to get on and off exam table.  Alopecia  HEENT:PERRL, sclerae not icteric. Oral mucosa moist without lesions, posterior pharynx clear. No thrush seen.  Neck supple. No JVD.  Lymphatics:no cervical,supraclavicular, axillary or inguinal adenopathy Resp: clear to auscultation bilaterally and normal percussion bilaterally Cardio: regular rate and rhythm now. No gallop. GI: soft, nontender, not distended, no mass or organomegaly. Normally active bowel sounds. Surgical incision not remarkable. Musculoskeletal/ Extremities: without pitting edema, cords, tenderness Neuro: minimal peripheral neuropathy as noted. Otherwise nonfocal. PSYCH more cheerful mood and affect Skin without rash, ecchymosis, petechiae Portacath-without erythema or tenderness  Lab Results:  Results for orders placed or performed in visit on 07/21/15  CBC with Differential  Result Value Ref Range    WBC 4.6 3.9 - 10.3 10e3/uL   NEUT# 2.1 1.5 - 6.5 10e3/uL   HGB 11.6 11.6 - 15.9 g/dL   HCT 34.0 (L) 34.8 - 46.6 %   Platelets 186 145 - 400 10e3/uL   MCV 99.2 79.5 - 101.0 fL   MCH 33.8 25.1 - 34.0 pg   MCHC 34.0 31.5 - 36.0 g/dL   RBC 3.43 (L) 3.70 - 5.45 10e6/uL   RDW 14.8 (H) 11.2 - 14.5 %   lymph# 1.9 0.9 - 3.3 10e3/uL   MONO# 0.5 0.1 - 0.9 10e3/uL   Eosinophils Absolute 0.0 0.0 - 0.5 10e3/uL   Basophils Absolute 0.1 0.0 - 0.1 10e3/uL   NEUT% 44.8 38.4 - 76.8 %   LYMPH% 41.8 14.0 - 49.7 %   MONO% 11.6 0.0 - 14.0 %   EOS% 0.3 0.0 - 7.0 %   BASO% 1.5 0.0 - 2.0 %  Comprehensive metabolic panel (Cmet) - [facility]  Result Value Ref Range   Sodium 140 136 - 145 mEq/L   Potassium 4.1 3.5 - 5.1 mEq/L   Chloride 104 98 - 109 mEq/L   CO2 29 22 - 29 mEq/L   Glucose 90 70 - 140 mg/dl   BUN 10.8 7.0 - 26.0 mg/dL   Creatinine 0.6 0.6 - 1.1 mg/dL   Total Bilirubin 0.55 0.20 - 1.20 mg/dL   Alkaline  Phosphatase 65 40 - 150 U/L   AST 14 5 - 34 U/L   ALT 12 0 - 55 U/L   Total Protein 5.9 (L) 6.4 - 8.3 g/dL   Albumin 3.6 3.5 - 5.0 g/dL   Calcium 9.1 8.4 - 10.4 mg/dL   Anion Gap 7 3 - 11 mEq/L   EGFR >90 >90 ml/min/1.73 m2  Urine protein by dipstick - [facility]  Result Value Ref Range   Protein, ur Negative Negative- <30 mg/dL   CA 125 from 07-07-15 was 14, this having been 39 on 04-21-15.  Studies/Results: At patient's request, we have reviewed report and images on PACs of CT 07-02-15, specifically discussing all areas of involvment seen on imaging.   Medications: I have reviewed the patient's current medications. Continue decadron as with cycle 4. Laxatives to be increased beginning day of Rx. Continue granix.  DISCUSSION: Patient is concerned that interval CT did not show more improvement from the first 3 cycles of chemo for this recurrent disease, tho Dr Josephina Shih and I are both encouraged that she is having response. I have explained again that this recurrent disease cannot be cured, tho we  would be glad to obtain results good enough to allow her to go back onto observation.  She is pleased that most recent treatment was more tolerable with the several days of low dose steroids, which we will continue. IVF day 2 seem helpful, continue. She is in agreement with gCSF support as presently being done. She will increase laxatives beginning day of chemo.  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 Gradual increase in CA125 March and April 2016 with PET evidence of progressive disease pelvis and abdomen, asymptomatic. Resumed chemotherapy with carboplatin and taxol using q 3 week dosing, cycle 1 on 05-01-15. Addition of avastin with cycle 2 on 05-26-15.Continue present regimen for total 6 cycles then repeat imaging. 2.PAC in 3.HTN: likely exacerbated by Avastin. Losartan resumed recently and dose increased 07-17-15, still excessively elevated BP. Patient will call cardiology or PCP to let them know present BP readings. Hold avastin 8-29, but hope to resume that with subsequent cycle if BP allows.  4.paroxysmal Afib: followed by Dr Ellyn Hack of cardiology, on B blocker now. Note on plavix and ASA if platelets drop with chemo.  5.chronic constipation requiring increased laxatives with chemo, up to date on colonoscopy.  6.hypothyroid on replacement by PCP 7. Left knee replacement 12-2014, doing well. Right hip replacement 2013 for osteoarthritis 8. Long estrogen replacement, which she still uses. She has previously refused to discontinue the estrogen. Initial surgical path from 02-13-13 does not report ER PR testing. 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 Breast tissue heterogeneously dense by last tomo mammograms at [facility] 03-21-15, those mammograms otherwise negative.  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 GERD: better with protonix, continue 12 Allergic rhinorrhea with post nasal drainage despite claritin: ok for OTC flonase or nasacort  She will have lab on 8-29, with chemo as long as ANC >=1.5 and plt >=100k, then IVF with IV decadron day 2 followed by po decadron days 3-4-5. She will have granix x 4 beginning day 2. Hold avastin 07-28-15 as above. Chemo orders confirmed, granix and IVF orders confirmed. All questions answered. Time spent 30 min including >50% counseling and coordination of care. Cc Drs Ellyn Hack and Esaw Dace, MD   07/21/2015,  9:00 PM

## 2015-07-22 ENCOUNTER — Telehealth: Payer: Self-pay | Admitting: Oncology

## 2015-07-22 ENCOUNTER — Other Ambulatory Visit: Payer: Self-pay | Admitting: Oncology

## 2015-07-22 ENCOUNTER — Telehealth: Payer: Self-pay | Admitting: Cardiology

## 2015-07-22 NOTE — Telephone Encounter (Signed)
SPOKE TO PATIENT  Patient states her oncologist  Informed her she can not have chemo until blood pressure comes down. Next chemo date is Monday 07/28/15. Blood pressure  Range 04/19/15   171/81 , 160/82                                       04/20/15    152/106,162/88, 171/88                                        04/21/15   161/101, 157/86 Patient is taking losartan 50 mg twice a day and Metoprolol 2(25 mg)a total 50 mg twice a day.   schedule an appointment with Erasmo Downer (pharmacist) 07/23/15 -blood pressure check.

## 2015-07-22 NOTE — Telephone Encounter (Signed)
Please call,blood pressure is still up.Her Oncologist says they will not do any more Chemo until her blood pressure is down. If not at home, please call-[phone].

## 2015-07-22 NOTE — Telephone Encounter (Signed)
Appointments made and patient called and is aware

## 2015-07-23 ENCOUNTER — Encounter: Payer: Self-pay | Admitting: Pharmacist Clinician (PhC)/ Clinical Pharmacy Specialist

## 2015-07-23 ENCOUNTER — Ambulatory Visit (INDEPENDENT_AMBULATORY_CARE_PROVIDER_SITE_OTHER): Payer: Medicare Other | Admitting: Pharmacist Clinician (PhC)/ Clinical Pharmacy Specialist

## 2015-07-23 VITALS — BP 180/88 | HR 64 | Ht 63.0 in | Wt 133.4 lb

## 2015-07-23 DIAGNOSIS — C569 Malignant neoplasm of unspecified ovary: Secondary | ICD-10-CM | POA: Insufficient documentation

## 2015-07-23 DIAGNOSIS — Z95828 Presence of other vascular implants and grafts: Secondary | ICD-10-CM | POA: Insufficient documentation

## 2015-07-23 DIAGNOSIS — T451X5A Adverse effect of antineoplastic and immunosuppressive drugs, initial encounter: Secondary | ICD-10-CM | POA: Insufficient documentation

## 2015-07-23 DIAGNOSIS — G620 Drug-induced polyneuropathy: Secondary | ICD-10-CM | POA: Insufficient documentation

## 2015-07-23 DIAGNOSIS — I158 Other secondary hypertension: Secondary | ICD-10-CM | POA: Insufficient documentation

## 2015-07-23 DIAGNOSIS — T50905A Adverse effect of unspecified drugs, medicaments and biological substances, initial encounter: Secondary | ICD-10-CM | POA: Insufficient documentation

## 2015-07-23 DIAGNOSIS — I10 Essential (primary) hypertension: Secondary | ICD-10-CM

## 2015-07-23 MED ORDER — AMLODIPINE BESYLATE 5 MG PO TABS: 5.0000 mg | ORAL_TABLET | Freq: Every day | ORAL | Status: DC

## 2015-07-23 NOTE — Progress Notes (Signed)
07/23/2015 [name] 1941-04-22 [id number]   HPI:  [name] is a 74 y.o. female patient of [name], with a PMH below who presents today for hypertension clinic evaluation.   She is currently being followed by the cancer center at [facility] for a recurrence of ovarian cancer.  Her chemo regimen includes Avastin, which she believes is the cause of her recent increases in blood pressure.   Her Avastin will be held at her next chemo treatment on Monday if her pressure is not lower.  Cardiac Hx: previously had hypotension with her first round of chemotherapy 2 years ago; AF  Family Hx: mom - hypertension in her 47s; dad died at 40 of aortic aneurysm  Social Hx: no tobacco or alcohol, does not drink caffeine  Diet: does add salt to some foods or when cooking, does not consider her diet to be high in sodium  Home BP readings: 150-190/80-110 by home cuff  Current antihypertensive medications: losartan 50 mg bid, metoprolol 50 mg bid   Current Outpatient Prescriptions  Medication Sig Dispense Refill  . amLODipine (NORVASC) 5 MG tablet Take 1 tablet (5 mg total) by mouth daily. 30 tablet 5  . amoxicillin (AMOXIL) 500 MG capsule Piror to dental procedures    . aspirin EC 81 MG tablet Take 81 mg by mouth every morning.     . bisacodyl (DULCOLAX) 5 MG EC tablet Take 5 mg by mouth daily as needed for moderate constipation.    . clopidogrel (PLAVIX) 75 MG tablet TAKE 1 TABLET BY MOUTH AT BEDTIME (Patient taking differently: Take 1 tablet by mouth at bedtime.) 90 tablet 3  . desonide (DESOWEN) 0.05 % cream Apply 1 application topically 2 (two) times daily as needed (rash).   1  . dexamethasone (DECADRON) 4 MG tablet Take 5 tabs with food 12 hrs and 6 hrs prior to chemotherapy. Take an additional tab daily for 3 days beginning the 2nd day after chemotherapy. (Patient not taking: Reported on 07/21/2015) 16 tablet 0  . docusate sodium (COLACE) 100 MG capsule Take 100 mg by mouth 2 (two)  times daily as needed for mild constipation.     Marland Kitchen estradiol (ESTRACE) 1 MG tablet Take 1 tablet (1 mg total) by mouth every morning. PT STATES SHE WANTS ESTRACE--NOT THE GENERIC 90 tablet 3  . Glucosamine HCl (GLUCOSAMINE PO) Take 1,000 mg by mouth every morning.     Marland Kitchen levothyroxine (SYNTHROID, LEVOTHROID) 88 MCG tablet Take 88 mcg by mouth every morning. Takes on an empty stomach. PT STATES SHE NEEDS THE SYNTHROID--DOES NOT WANT THE GENERIC    . lidocaine-prilocaine (EMLA) cream Apply to Porta-Cath site 1-2 hours prior to access as directed. 30 g 2  . loratadine (CLARITIN) 10 MG tablet Take 10 mg by mouth daily as needed for allergies.    Marland Kitchen LORazepam (ATIVAN) 1 MG tablet Place 1/2 -1 tablet under the tongue or swallow every 6 hrs as needed for nausea. Will make drowsy. 20 tablet 0  . losartan (COZAAR) 50 MG tablet Take 1 tablet (50 mg total) by mouth 2 (two) times daily. 180 tablet 3  . magnesium hydroxide (MILK OF MAGNESIA) 800 MG/5ML suspension Take 30 mLs by mouth daily as needed for constipation.    . Menthol, Topical Analgesic, (ICY HOT EX) Apply 1 application topically daily as needed (pain.).    Marland Kitchen metoprolol tartrate (LOPRESSOR) 25 MG tablet Take 2 tablets by mouth 2 (two) times daily. Take 2 tabs twice  a day    . Multiple Vitamin (MULTIVITAMIN WITH MINERALS) TABS tablet Take 1 tablet by mouth every morning.     . ondansetron (ZOFRAN) 8 MG tablet Take 1 tablet (8 mg total) by mouth every 8 (eight) hours as needed for nausea or vomiting (Will not make drowsy). 30 tablet 1  . pantoprazole (PROTONIX) 40 MG tablet Take 1 tablet (40 mg total) by mouth daily. For GERD 30 tablet 3  . Polyethyl Glycol-Propyl Glycol (SYSTANE OP) Place 1 drop into both eyes 2 (two) times daily.     . polyethylene glycol (MIRALAX / GLYCOLAX) packet Take 17 g by mouth every morning.     . potassium chloride (K-DUR) 10 MEQ tablet Take 10 mEq by mouth every Monday, Wednesday, and Friday.    . propafenone (RYTHMOL) 225 MG  tablet Take 1 tablet (225 mg total) by mouth 2 (two) times daily. 180 tablet 3   No current facility-administered medications for this visit.    Allergies  Allergen Reactions  . Ceftin [Cefuroxime Axetil]     Interferes with propafenone.    . Codeine Other (See Comments)    Does not like the feeling she gets  . Flexeril [Cyclobenzaprine] Other (See Comments)    Pt states "increased heart rate"  . Levaquin [Levofloxacin In D5w]     Interferes with propafenone.   . Lisinopril     LIP NUMBNESS  . Tegaderm Ag Mesh [Silver]   . Tape Rash    TEGADERM.   (use opsite on PAC)  . Ultram [Tramadol] Palpitations    Past Medical History  Diagnosis Date  . Hypertension   . Dyslipidemia   . Hypothyroidism   . Arthritis     OSTEOARTHRITIS   -- CONSTANT PAIN RIGHT HIP---AND PAIN LEFT KNEE--PT STATES SHE GETS INJECTIONS INTO HER KNEE  . PAF (paroxysmal atrial fibrillation)     Only on Plavix -- not full Anticoagulation per pt. request  . Complication of anesthesia     BLOOD PRESSURE DROPPED WITH NASAL SURGERY, ONE OF THE CARPAL TUNNEL REPAIRS AND DURING A COLONOSCOPY  . History of skin cancer   . Constipation   . Ovarian cancer 01/2013    Recurrence since 2014/2016    Blood pressure 180/88, pulse 64, height 5\' 3"  (1.6 m), weight 133 lb 6.4 oz (60.51 kg).    [name] PharmD CPP [facility]

## 2015-07-23 NOTE — Patient Instructions (Signed)
Return for a a follow up appointment in 2 weeks   Your blood pressure today is 180/88  (goal is <150/90)  Check your blood pressure at home twice daily and keep record of the readings.  Take your BP meds as follows: add amlodipine 5 mg once daily, continue all other medications  Bring all of your meds, your BP cuff and your record of home blood pressures to your next appointment.  Exercise as you're able, try to walk approximately 30 minutes per day.  Keep salt intake to a minimum, especially watch canned and prepared boxed foods.  Eat more fresh fruits and vegetables and fewer canned items.  Avoid eating in fast food restaurants.    HOW TO TAKE YOUR BLOOD PRESSURE: . Rest 5 minutes before taking your blood pressure. .  Don't smoke or drink caffeinated beverages for at least 30 minutes before. . Take your blood pressure before (not after) you eat. . Sit comfortably with your back supported and both feet on the floor (don't cross your legs). . Elevate your arm to heart level on a table or a desk. . Use the proper sized cuff. It should fit smoothly and snugly around your bare upper arm. There should be enough room to slip a fingertip under the cuff. The bottom edge of the cuff should be 1 inch above the crease of the elbow. . Ideally, take 3 measurements at one sitting and record the average.

## 2015-07-23 NOTE — Telephone Encounter (Signed)
Great idea.  Oncologist thought maybe chemo was adding to increased BP.  [place]

## 2015-07-23 NOTE — Assessment & Plan Note (Signed)
Today her pressure remains elevated at 180/88.  Standing pressure at 178/92.  She states compliance with medications at this time.  Will add amlodipine 5 mg once daily to her regimen and have her continue with twice daily blood pressure checks at home.  I have asked that she bring her home meter for verification at her next visit in 2 weeks.  Reviewed possible side effects of amlodipine.

## 2015-07-24 ENCOUNTER — Telehealth: Payer: Self-pay | Admitting: *Deleted

## 2015-07-24 NOTE — Telephone Encounter (Signed)
Faxed wig prescription to [facility] per patient request. Patient notified that this has been done.

## 2015-07-28 ENCOUNTER — Emergency Department (HOSPITAL_COMMUNITY)
Admission: EM | Admit: 2015-07-28 | Discharge: 2015-07-28 | Disposition: A | Discharge status: Home / Self Care | Payer: Medicare Other | Attending: Emergency Medicine | Admitting: Emergency Medicine

## 2015-07-28 ENCOUNTER — Other Ambulatory Visit: Payer: Medicare Other

## 2015-07-28 ENCOUNTER — Ambulatory Visit: Payer: Medicare Other

## 2015-07-28 ENCOUNTER — Ambulatory Visit: Payer: Medicare Other | Admitting: Oncology

## 2015-07-28 ENCOUNTER — Encounter (HOSPITAL_COMMUNITY): Payer: Self-pay | Admitting: Emergency Medicine

## 2015-07-28 ENCOUNTER — Other Ambulatory Visit (HOSPITAL_BASED_OUTPATIENT_CLINIC_OR_DEPARTMENT_OTHER): Payer: Medicare Other

## 2015-07-28 ENCOUNTER — Ambulatory Visit (HOSPITAL_BASED_OUTPATIENT_CLINIC_OR_DEPARTMENT_OTHER): Payer: Medicare Other

## 2015-07-28 VITALS — BP 152/73 | HR 66 | Temp 97.0°F | Resp 20

## 2015-07-28 DIAGNOSIS — I10 Essential (primary) hypertension: Secondary | ICD-10-CM | POA: Insufficient documentation

## 2015-07-28 DIAGNOSIS — C561 Malignant neoplasm of right ovary: Secondary | ICD-10-CM

## 2015-07-28 DIAGNOSIS — Z79899 Other long term (current) drug therapy: Secondary | ICD-10-CM | POA: Diagnosis not present

## 2015-07-28 DIAGNOSIS — Y832 Surgical operation with anastomosis, bypass or graft as the cause of abnormal reaction of the patient, or of later complication, without mention of misadventure at the time of the procedure: Secondary | ICD-10-CM | POA: Insufficient documentation

## 2015-07-28 DIAGNOSIS — Z5111 Encounter for antineoplastic chemotherapy: Secondary | ICD-10-CM

## 2015-07-28 DIAGNOSIS — Z792 Long term (current) use of antibiotics: Secondary | ICD-10-CM | POA: Diagnosis not present

## 2015-07-28 DIAGNOSIS — Z85828 Personal history of other malignant neoplasm of skin: Secondary | ICD-10-CM | POA: Diagnosis not present

## 2015-07-28 DIAGNOSIS — Z7982 Long term (current) use of aspirin: Secondary | ICD-10-CM | POA: Insufficient documentation

## 2015-07-28 DIAGNOSIS — C562 Malignant neoplasm of left ovary: Secondary | ICD-10-CM

## 2015-07-28 DIAGNOSIS — C569 Malignant neoplasm of unspecified ovary: Secondary | ICD-10-CM

## 2015-07-28 DIAGNOSIS — T82838A Hemorrhage of vascular prosthetic devices, implants and grafts, initial encounter: Secondary | ICD-10-CM | POA: Diagnosis present

## 2015-07-28 DIAGNOSIS — C7989 Secondary malignant neoplasm of other specified sites: Secondary | ICD-10-CM

## 2015-07-28 DIAGNOSIS — R58 Hemorrhage, not elsewhere classified: Secondary | ICD-10-CM

## 2015-07-28 DIAGNOSIS — Z8543 Personal history of malignant neoplasm of ovary: Secondary | ICD-10-CM | POA: Insufficient documentation

## 2015-07-28 DIAGNOSIS — M1990 Unspecified osteoarthritis, unspecified site: Secondary | ICD-10-CM | POA: Insufficient documentation

## 2015-07-28 DIAGNOSIS — O00209 Unspecified ovarian pregnancy without intrauterine pregnancy: Secondary | ICD-10-CM

## 2015-07-28 DIAGNOSIS — E039 Hypothyroidism, unspecified: Secondary | ICD-10-CM | POA: Insufficient documentation

## 2015-07-28 DIAGNOSIS — I480 Paroxysmal atrial fibrillation: Secondary | ICD-10-CM | POA: Diagnosis not present

## 2015-07-28 LAB — CBC WITH DIFFERENTIAL/PLATELET
BASO%: 0 % (ref 0.0–2.0)
BASOS ABS: 0 10*3/uL (ref 0.0–0.1)
EOS%: 0 % (ref 0.0–7.0)
Eosinophils Absolute: 0 10*3/uL (ref 0.0–0.5)
HCT: 35.5 % (ref 34.8–46.6)
HEMOGLOBIN: 12.3 g/dL (ref 11.6–15.9)
LYMPH%: 11.7 % — ABNORMAL LOW (ref 14.0–49.7)
MCH: 34.3 pg — AB (ref 25.1–34.0)
MCHC: 34.6 g/dL (ref 31.5–36.0)
MCV: 98.9 fL (ref 79.5–101.0)
MONO#: 0 10*3/uL — ABNORMAL LOW (ref 0.1–0.9)
MONO%: 0.3 % (ref 0.0–14.0)
NEUT#: 3 10*3/uL (ref 1.5–6.5)
NEUT%: 88 % — AB (ref 38.4–76.8)
Platelets: 173 10*3/uL (ref 145–400)
RBC: 3.59 10*6/uL — ABNORMAL LOW (ref 3.70–5.45)
RDW: 14.5 % (ref 11.2–14.5)
WBC: 3.4 10*3/uL — ABNORMAL LOW (ref 3.9–10.3)
lymph#: 0.4 10*3/uL — ABNORMAL LOW (ref 0.9–3.3)

## 2015-07-28 LAB — COMPREHENSIVE METABOLIC PANEL (CC13)
ALT: 11 U/L (ref 0–55)
AST: 13 U/L (ref 5–34)
Albumin: 3.9 g/dL (ref 3.5–5.0)
Alkaline Phosphatase: 70 U/L (ref 40–150)
Anion Gap: 11 mEq/L (ref 3–11)
BUN: 13.5 mg/dL (ref 7.0–26.0)
CHLORIDE: 102 meq/L (ref 98–109)
CO2: 23 mEq/L (ref 22–29)
Calcium: 9.2 mg/dL (ref 8.4–10.4)
Creatinine: 0.7 mg/dL (ref 0.6–1.1)
EGFR: 85 mL/min/{1.73_m2} — ABNORMAL LOW (ref >90)
GLUCOSE: 218 mg/dL — AB (ref 70–140)
POTASSIUM: 4 meq/L (ref 3.5–5.1)
SODIUM: 137 meq/L (ref 136–145)
Total Bilirubin: 0.63 mg/dL (ref 0.20–1.20)
Total Protein: 6.4 g/dL (ref 6.4–8.3)

## 2015-07-28 MED ORDER — CARBOPLATIN CHEMO INTRADERMAL TEST DOSE 100MCG/0.02ML
100.0000 ug | Freq: Once | INTRADERMAL | Status: AC
  Administered 2015-07-28: 100 ug via INTRADERMAL
  Filled 2015-07-28: qty 0.01

## 2015-07-28 MED ORDER — DIPHENHYDRAMINE HCL 50 MG/ML IJ SOLN
25.0000 mg | Freq: Once | INTRAMUSCULAR | Status: AC
  Administered 2015-07-28: 25 mg via INTRAVENOUS

## 2015-07-28 MED ORDER — SODIUM CHLORIDE 0.9 % IJ SOLN
10.0000 mL | INTRAMUSCULAR | Status: DC | PRN
  Administered 2015-07-28: 10 mL
  Filled 2015-07-28: qty 10

## 2015-07-28 MED ORDER — SODIUM CHLORIDE 0.9 % IV SOLN
300.0000 mg | Freq: Once | INTRAVENOUS | Status: AC
  Administered 2015-07-28: 300 mg via INTRAVENOUS
  Filled 2015-07-28: qty 30

## 2015-07-28 MED ORDER — DIPHENHYDRAMINE HCL 50 MG/ML IJ SOLN
INTRAMUSCULAR | Status: AC
Start: 2015-07-28 — End: 2015-07-28
  Filled 2015-07-28: qty 1

## 2015-07-28 MED ORDER — HEPARIN SOD (PORK) LOCK FLUSH 100 UNIT/ML IV SOLN
500.0000 [IU] | Freq: Once | INTRAVENOUS | Status: AC | PRN
  Administered 2015-07-28: 500 [IU]
  Filled 2015-07-28: qty 5

## 2015-07-28 MED ORDER — PACLITAXEL CHEMO INJECTION 300 MG/50ML
135.0000 mg/m2 | Freq: Once | INTRAVENOUS | Status: AC
  Administered 2015-07-28: 222 mg via INTRAVENOUS
  Filled 2015-07-28: qty 37

## 2015-07-28 MED ORDER — SODIUM CHLORIDE 0.9 % IV SOLN
Freq: Once | INTRAVENOUS | Status: AC
  Administered 2015-07-28: 12:00:00 via INTRAVENOUS
  Filled 2015-07-28: qty 8

## 2015-07-28 MED ORDER — SODIUM CHLORIDE 0.9 % IV SOLN
Freq: Once | INTRAVENOUS | Status: AC
  Administered 2015-07-28: 11:00:00 via INTRAVENOUS

## 2015-07-28 MED ORDER — FAMOTIDINE IN NACL 20-0.9 MG/50ML-% IV SOLN
INTRAVENOUS | Status: AC
  Filled 2015-07-28: qty 50

## 2015-07-28 MED ORDER — HEPARIN SOD (PORK) LOCK FLUSH 100 UNIT/ML IV SOLN
500.0000 [IU] | Freq: Once | INTRAVENOUS | Status: DC
  Filled 2015-07-28: qty 5

## 2015-07-28 MED ORDER — SODIUM CHLORIDE 0.9 % IJ SOLN
10.0000 mL | INTRAMUSCULAR | Status: DC | PRN
  Administered 2015-07-28: 10 mL via INTRAVENOUS
  Filled 2015-07-28: qty 10

## 2015-07-28 MED ORDER — FAMOTIDINE IN NACL 20-0.9 MG/50ML-% IV SOLN
20.0000 mg | Freq: Once | INTRAVENOUS | Status: AC
  Administered 2015-07-28: 20 mg via INTRAVENOUS

## 2015-07-28 NOTE — ED Notes (Addendum)
Pt A+ox4, reports had chemo treatment today and had some mild bleeding after her port needle was removed.  Pt sts "by the time I got home my blouse was covered".  Pt reports port site has continued to bleed since.  Pt with pressure dressing placed by husband in place at this time.  Mild oozing of blood noted from site.  Pt denies other complaints.  Skin otherwise PWD.  Speaking full/clear sentences, rr even/un-lab.  MAEI, ambulatory with steady gait.  NAD.

## 2015-07-28 NOTE — ED Provider Notes (Signed)
CSN: [id number]     Arrival date & time 07/28/15  1832 History   First MD Initiated Contact with Patient 07/28/15 1857     Chief Complaint  Patient presents with  . bleeding from portacath     after chemo today      HPI Patient currently is receiving chemotherapy for recurrent ovarian cancer.  She had her port accessed today and afterwards she has had persistent continuous small amount of bleeding from her Port-A-Cath where the puncture site was.  They've been unable to stop the bleeding with pressure.  She is on Plavix.   Past Medical History  Diagnosis Date  . Hypertension   . Dyslipidemia   . Hypothyroidism   . Arthritis     OSTEOARTHRITIS   -- CONSTANT PAIN RIGHT HIP---AND PAIN LEFT KNEE--PT STATES SHE GETS INJECTIONS INTO HER KNEE  . PAF (paroxysmal atrial fibrillation)     Only on Plavix -- not full Anticoagulation per pt. request  . Complication of anesthesia     BLOOD PRESSURE DROPPED WITH NASAL SURGERY, ONE OF THE CARPAL TUNNEL REPAIRS AND DURING A COLONOSCOPY  . History of skin cancer   . Constipation   . Ovarian cancer 01/2013    Recurrence since 2014/2016   Past Surgical History  Procedure Laterality Date  . Bilateral carpal tunnel repair  2007  . Dilation and curettage of uterus  1969  . Surgery for ruptured ovarian cyst  1969  . Rhinoplasty for fractured nose  1986  . Left knee arthroscopy   2011  . Total hip arthroplasty  02/25/2012    Procedure: TOTAL HIP ARTHROPLASTY ANTERIOR APPROACH;  Surgeon: Mcarthur Rossetti, MD;  Location: [facility];  Service: Orthopedics;  Laterality: Right;  . Tonsillectomy  1962  . Laparotomy Bilateral 02/13/2013    Procedure: EXPLORATORY LAPAROTOMY TOTAL ABDOMINAL HYSTERECTOMY BILATERAL SALPINGO-OOPHORECTOMY, Partial Rectal Resection with Reanastamosis;  Surgeon: Alvino Chapel, MD;  Location: [facility];  Service: Gynecology;  Laterality: Bilateral;  . Omentectomy  02/13/2013    Procedure: OMENTECTOMY;  Surgeon: Alvino Chapel, MD;  Location: [facility];  Service: Gynecology;;  . Lymphadenectomy Right 02/13/2013    Procedure: PEVLIC  LYMPHADENECTOMY, DEBULKING right pelvic tumor nodules;  Surgeon: Alvino Chapel, MD;  Location: [facility];  Service: Gynecology;  Laterality: Right;  . Total abdominal hysterectomy  01/2013  . Transthoracic echocardiogram  May 2014    Normal LV size function. EF 60-65%. Grade 1 diastolic function. Mild MR and mildly elevated PA pressures of 37 mmHg per  . Nm myoview ltd  June 2010     subbmaximal with no ischemia or infarction.  . Total knee arthroplasty Left 01/03/2015    Procedure: LEFT TOTAL KNEE ARTHROPLASTY;  Surgeon: Mcarthur Rossetti, MD;  Location: [facility];  Service: Orthopedics;  Laterality: Left;   Family History  Problem Relation Age of Onset  . Hypertension Mother   . Basal cell carcinoma Mother   . AAA (abdominal aortic aneurysm) Father   . Heart Problems Maternal Uncle   . Heart Problems Maternal Grandfather   . AAA (abdominal aortic aneurysm) Paternal Grandmother   . Prostate cancer Maternal Uncle 70  . Prostate cancer Other   . Other Daughter     one daughter had TAH-BSO at 31; other daughter will have one soon   Social History  Substance Use Topics  . Smoking status: Never Smoker   . Smokeless tobacco: Never Used  . Alcohol Use: No   OB History  Gravida Para Term Preterm AB TAB SAB Ectopic Multiple Living   3 2   1  1  1 3      Review of Systems  All other systems reviewed and are negative.     Allergies  Ceftin; Codeine; Flexeril; Levaquin; Lisinopril; Tegaderm ag mesh; Tape; and Ultram  Home Medications   Prior to Admission medications   Medication Sig Start Date End Date Taking? Authorizing Provider  amLODipine (NORVASC) 5 MG tablet Take 1 tablet (5 mg total) by mouth daily. 07/23/15   Leonie Man, MD  amoxicillin (AMOXIL) 500 MG capsule Piror to dental procedures 02/26/15   Historical Provider, MD  aspirin EC 81 MG  tablet Take 81 mg by mouth every morning.     Historical Provider, MD  bisacodyl (DULCOLAX) 5 MG EC tablet Take 5 mg by mouth daily as needed for moderate constipation.    Historical Provider, MD  clopidogrel (PLAVIX) 75 MG tablet TAKE 1 TABLET BY MOUTH AT BEDTIME Patient taking differently: Take 1 tablet by mouth at bedtime. 03/14/15   Leonie Man, MD  desonide (DESOWEN) 0.05 % cream Apply 1 application topically 2 (two) times daily as needed (rash).  12/23/14   Historical Provider, MD  dexamethasone (DECADRON) 4 MG tablet Take 5 tabs with food 12 hrs and 6 hrs prior to chemotherapy. Take an additional tab daily for 3 days beginning the 2nd day after chemotherapy. Patient not taking: Reported on 07/21/2015 07/07/15   Gordy Levan, MD  docusate sodium (COLACE) 100 MG capsule Take 100 mg by mouth 2 (two) times daily as needed for mild constipation.     Historical Provider, MD  estradiol (ESTRACE) 1 MG tablet Take 1 tablet (1 mg total) by mouth every morning. PT STATES SHE WANTS ESTRACE--NOT THE GENERIC 03/28/15   Marti Sleigh, MD  Glucosamine HCl (GLUCOSAMINE PO) Take 1,000 mg by mouth every morning.     Historical Provider, MD  levothyroxine (SYNTHROID, LEVOTHROID) 88 MCG tablet Take 88 mcg by mouth every morning. Takes on an empty stomach. PT STATES SHE NEEDS THE SYNTHROID--DOES NOT WANT THE GENERIC    Historical Provider, MD  lidocaine-prilocaine (EMLA) cream Apply to Porta-Cath site 1-2 hours prior to access as directed. 04/22/15   Lennis Marion Downer, MD  loratadine (CLARITIN) 10 MG tablet Take 10 mg by mouth daily as needed for allergies.    Historical Provider, MD  LORazepam (ATIVAN) 1 MG tablet Place 1/2 -1 tablet under the tongue or swallow every 6 hrs as needed for nausea. Will make drowsy. 04/22/15   Lennis Marion Downer, MD  losartan (COZAAR) 50 MG tablet Take 1 tablet (50 mg total) by mouth 2 (two) times daily. 07/17/15   Skeet Latch, MD  magnesium hydroxide (MILK OF MAGNESIA) 800  MG/5ML suspension Take 30 mLs by mouth daily as needed for constipation.    Historical Provider, MD  Menthol, Topical Analgesic, (ICY HOT EX) Apply 1 application topically daily as needed (pain.).    Historical Provider, MD  metoprolol tartrate (LOPRESSOR) 25 MG tablet Take 2 tablets by mouth 2 (two) times daily. Take 2 tabs twice a day 06/05/15   Historical Provider, MD  Multiple Vitamin (MULTIVITAMIN WITH MINERALS) TABS tablet Take 1 tablet by mouth every morning.     Historical Provider, MD  ondansetron (ZOFRAN) 8 MG tablet Take 1 tablet (8 mg total) by mouth every 8 (eight) hours as needed for nausea or vomiting (Will not make drowsy). 07/11/15   Lennis Marion Downer, MD  pantoprazole (PROTONIX) 40 MG tablet Take 1 tablet (40 mg total) by mouth daily. For GERD 06/05/15   Lennis Marion Downer, MD  Polyethyl Glycol-Propyl Glycol (SYSTANE OP) Place 1 drop into both eyes 2 (two) times daily.     Historical Provider, MD  polyethylene glycol (MIRALAX / GLYCOLAX) packet Take 17 g by mouth every morning.     Historical Provider, MD  potassium chloride (K-DUR) 10 MEQ tablet Take 10 mEq by mouth every Monday, Wednesday, and Friday. 05/05/13   Shon Baton, MD  propafenone (RYTHMOL) 225 MG tablet Take 1 tablet (225 mg total) by mouth 2 (two) times daily. 12/12/14   Leonie Man, MD   BP 177/89 mmHg  Pulse 75  Temp(Src) 97.6 F (36.4 C) (Oral)  Resp 16  SpO2 100% Physical Exam  Constitutional: She is oriented to person, place, and time. She appears well-developed and well-nourished.  HENT:  Head: Normocephalic.  Eyes: EOM are normal.  Neck: Normal range of motion.  Pulmonary/Chest: Effort normal.  Small punctate area of bleeding coming from her Port-A-Cath overlying the recent puncture site.  Bleeding is small but present  Abdominal: She exhibits no distension.  Musculoskeletal: Normal range of motion.  Neurological: She is alert and oriented to person, place, and time.  Psychiatric: She has a normal mood and  affect.  Nursing note and vitals reviewed.   ED Course  Procedures (including critical care time) Labs Review Labs Reviewed - No data to display  Imaging Review No results found. I have personally reviewed and evaluated these images and lab results as part of my medical decision-making.   EKG Interpretation None      MDM   Final diagnoses:  Bleeding    Bleeding controlled with direct pressure.  After direct pressure was applied and the bleeding is stopped a sterile clean dressing was applied with Surgicel followed by stacked gauze followed by OpSite.  Patient later on the emergency department.  She has no more bleeding.  She will return to the [facility] tomorrow as scheduled and they can recheck her Port-A-Cath at that time.  She understands return back to the ER for any recurrent bleeding.    Jola Schmidt, MD 07/28/15 2030

## 2015-07-28 NOTE — ED Notes (Signed)
Bed: WA13 Expected date:  Expected time:  Means of arrival:  Comments: Hold for triage 1 

## 2015-07-28 NOTE — Patient Instructions (Signed)
[facility] Discharge Instructions for Patients Receiving Chemotherapy  Today you received the following chemotherapy agents avastin, taxol, carboplatin  To help prevent nausea and vomiting after your treatment, we encourage you to take your nausea medication as directed by Dr Marko Plume   If you develop nausea and vomiting that is not controlled by your nausea medication, call the clinic.   BELOW ARE SYMPTOMS THAT SHOULD BE REPORTED IMMEDIATELY:  *FEVER GREATER THAN 100.5 F  *CHILLS WITH OR WITHOUT FEVER  NAUSEA AND VOMITING THAT IS NOT CONTROLLED WITH YOUR NAUSEA MEDICATION  *UNUSUAL SHORTNESS OF BREATH  *UNUSUAL BRUISING OR BLEEDING  TENDERNESS IN MOUTH AND THROAT WITH OR WITHOUT PRESENCE OF ULCERS  *URINARY PROBLEMS  *BOWEL PROBLEMS  UNUSUAL RASH Items with * indicate a potential emergency and should be followed up as soon as possible.  Feel free to call the clinic you have any questions or concerns. The clinic phone number is [phone].  Please show the [facility] at check-in to the Emergency Department and triage nurse.

## 2015-07-29 ENCOUNTER — Ambulatory Visit (HOSPITAL_BASED_OUTPATIENT_CLINIC_OR_DEPARTMENT_OTHER): Payer: Medicare Other

## 2015-07-29 ENCOUNTER — Ambulatory Visit: Payer: Medicare Other

## 2015-07-29 VITALS — BP 143/73 | HR 63 | Temp 97.4°F | Resp 18

## 2015-07-29 DIAGNOSIS — R561 Post traumatic seizures: Secondary | ICD-10-CM

## 2015-07-29 DIAGNOSIS — R971 Elevated cancer antigen 125 [CA 125]: Secondary | ICD-10-CM

## 2015-07-29 DIAGNOSIS — C562 Malignant neoplasm of left ovary: Secondary | ICD-10-CM | POA: Diagnosis not present

## 2015-07-29 DIAGNOSIS — C7989 Secondary malignant neoplasm of other specified sites: Secondary | ICD-10-CM | POA: Diagnosis not present

## 2015-07-29 DIAGNOSIS — C569 Malignant neoplasm of unspecified ovary: Secondary | ICD-10-CM

## 2015-07-29 MED ORDER — TBO-FILGRASTIM 300 MCG/0.5ML ~~LOC~~ SOSY
300.0000 ug | PREFILLED_SYRINGE | Freq: Once | SUBCUTANEOUS | Status: AC
  Administered 2015-07-29: 300 ug via SUBCUTANEOUS
  Filled 2015-07-29: qty 0.5

## 2015-07-29 MED ORDER — DEXAMETHASONE SODIUM PHOSPHATE 100 MG/10ML IJ SOLN
Freq: Once | INTRAMUSCULAR | Status: AC
  Administered 2015-07-29: 11:00:00 via INTRAVENOUS
  Filled 2015-07-29: qty 4

## 2015-07-29 MED ORDER — SODIUM CHLORIDE 0.9 % IV SOLN
Freq: Once | INTRAVENOUS | Status: AC
  Administered 2015-07-29: 11:00:00 via INTRAVENOUS

## 2015-07-29 MED ORDER — FAMOTIDINE IN NACL 20-0.9 MG/50ML-% IV SOLN
20.0000 mg | Freq: Once | INTRAVENOUS | Status: AC
  Administered 2015-07-29: 20 mg via INTRAVENOUS

## 2015-07-29 MED ORDER — SODIUM CHLORIDE 0.9 % IV SOLN: 4.0000 mg | Freq: Once | INTRAVENOUS | Status: DC

## 2015-07-29 MED ORDER — SODIUM CHLORIDE 0.9 % IV SOLN: Freq: Once | INTRAVENOUS | Status: DC | PRN

## 2015-07-29 MED ORDER — FAMOTIDINE IN NACL 20-0.9 MG/50ML-% IV SOLN
INTRAVENOUS | Status: AC
  Filled 2015-07-29: qty 50

## 2015-07-29 NOTE — Patient Instructions (Signed)
Dehydration, Adult Dehydration is when you lose more fluids from the body than you take in. Vital organs like the kidneys, brain, and heart cannot function without a proper amount of fluids and salt. Any loss of fluids from the body can cause dehydration.  CAUSES   Vomiting.  Diarrhea.  Excessive sweating.  Excessive urine output.  Fever. SYMPTOMS  Mild dehydration  Thirst.  Dry lips.  Slightly dry mouth. Moderate dehydration  Very dry mouth.  Sunken eyes.  Skin does not bounce back quickly when lightly pinched and released.  Dark urine and decreased urine production.  Decreased tear production.  Headache. Severe dehydration  Very dry mouth.  Extreme thirst.  Rapid, weak pulse (more than 100 beats per minute at rest).  Cold hands and feet.  Not able to sweat in spite of heat and temperature.  Rapid breathing.  Blue lips.  Confusion and lethargy.  Difficulty being awakened.  Minimal urine production.  No tears. DIAGNOSIS  Your caregiver will diagnose dehydration based on your symptoms and your exam. Blood and urine tests will help confirm the diagnosis. The diagnostic evaluation should also identify the cause of dehydration. TREATMENT  Treatment of mild or moderate dehydration can often be done at home by increasing the amount of fluids that you drink. It is best to drink small amounts of fluid more often. Drinking too much at one time can make vomiting worse. Refer to the home care instructions below. Severe dehydration needs to be treated at the hospital where you will probably be given intravenous (IV) fluids that contain water and electrolytes. HOME CARE INSTRUCTIONS   Ask your caregiver about specific rehydration instructions.  Drink enough fluids to keep your urine clear or pale yellow.  Drink small amounts frequently if you have nausea and vomiting.  Eat as you normally do.  Avoid:  Foods or drinks high in sugar.  Carbonated  drinks.  Juice.  Extremely hot or cold fluids.  Drinks with caffeine.  Fatty, greasy foods.  Alcohol.  Tobacco.  Overeating.  Gelatin desserts.  Wash your hands well to avoid spreading bacteria and viruses.  Only take over-the-counter or prescription medicines for pain, discomfort, or fever as directed by your caregiver.  Ask your caregiver if you should continue all prescribed and over-the-counter medicines.  Keep all follow-up appointments with your caregiver. SEEK MEDICAL CARE IF:  You have abdominal pain and it increases or stays in one area (localizes).  You have a rash, stiff neck, or severe headache.  You are irritable, sleepy, or difficult to awaken.  You are weak, dizzy, or extremely thirsty. SEEK IMMEDIATE MEDICAL CARE IF:   You are unable to keep fluids down or you get worse despite treatment.  You have frequent episodes of vomiting or diarrhea.  You have blood or green matter (bile) in your vomit.  You have blood in your stool or your stool looks black and tarry.  You have not urinated in 6 to 8 hours, or you have only urinated a small amount of very dark urine.  You have a fever.  You faint. MAKE SURE YOU:   Understand these instructions.  Will watch your condition.  Will get help right away if you are not doing well or get worse. Document Released: 11/15/2005 Document Revised: 02/07/2012 Document Reviewed: 07/05/2011 [employer] Patient Information 2015 [employer]. This information is not intended to replace advice given to you by your health care provider. Make sure you discuss any questions you have with your health care   provider.  

## 2015-07-30 ENCOUNTER — Ambulatory Visit (HOSPITAL_BASED_OUTPATIENT_CLINIC_OR_DEPARTMENT_OTHER): Payer: Medicare Other

## 2015-07-30 VITALS — BP 166/72 | HR 65 | Temp 97.7°F

## 2015-07-30 DIAGNOSIS — Z5189 Encounter for other specified aftercare: Secondary | ICD-10-CM

## 2015-07-30 DIAGNOSIS — R971 Elevated cancer antigen 125 [CA 125]: Secondary | ICD-10-CM

## 2015-07-30 DIAGNOSIS — C561 Malignant neoplasm of right ovary: Secondary | ICD-10-CM

## 2015-07-30 DIAGNOSIS — C562 Malignant neoplasm of left ovary: Secondary | ICD-10-CM | POA: Diagnosis not present

## 2015-07-30 DIAGNOSIS — C569 Malignant neoplasm of unspecified ovary: Secondary | ICD-10-CM

## 2015-07-30 MED ORDER — TBO-FILGRASTIM 300 MCG/0.5ML ~~LOC~~ SOSY
300.0000 ug | PREFILLED_SYRINGE | Freq: Once | SUBCUTANEOUS | Status: AC
  Administered 2015-07-30: 300 ug via SUBCUTANEOUS
  Filled 2015-07-30: qty 0.5

## 2015-07-31 ENCOUNTER — Encounter (HOSPITAL_COMMUNITY): Payer: Self-pay

## 2015-07-31 ENCOUNTER — Ambulatory Visit (HOSPITAL_BASED_OUTPATIENT_CLINIC_OR_DEPARTMENT_OTHER): Payer: Medicare Other

## 2015-07-31 VITALS — BP 157/80 | HR 72 | Temp 98.5°F

## 2015-07-31 DIAGNOSIS — C569 Malignant neoplasm of unspecified ovary: Secondary | ICD-10-CM

## 2015-07-31 DIAGNOSIS — Z5189 Encounter for other specified aftercare: Secondary | ICD-10-CM | POA: Diagnosis not present

## 2015-07-31 DIAGNOSIS — C562 Malignant neoplasm of left ovary: Secondary | ICD-10-CM | POA: Diagnosis not present

## 2015-07-31 DIAGNOSIS — R971 Elevated cancer antigen 125 [CA 125]: Secondary | ICD-10-CM

## 2015-07-31 DIAGNOSIS — C561 Malignant neoplasm of right ovary: Secondary | ICD-10-CM

## 2015-07-31 MED ORDER — TBO-FILGRASTIM 300 MCG/0.5ML ~~LOC~~ SOSY
300.0000 ug | PREFILLED_SYRINGE | Freq: Once | SUBCUTANEOUS | Status: AC
  Administered 2015-07-31: 300 ug via SUBCUTANEOUS
  Filled 2015-07-31: qty 0.5

## 2015-08-01 ENCOUNTER — Ambulatory Visit (HOSPITAL_BASED_OUTPATIENT_CLINIC_OR_DEPARTMENT_OTHER): Payer: Medicare Other

## 2015-08-01 VITALS — BP 162/81 | HR 69 | Temp 98.0°F

## 2015-08-01 DIAGNOSIS — C569 Malignant neoplasm of unspecified ovary: Secondary | ICD-10-CM

## 2015-08-01 DIAGNOSIS — C562 Malignant neoplasm of left ovary: Secondary | ICD-10-CM | POA: Diagnosis not present

## 2015-08-01 DIAGNOSIS — C561 Malignant neoplasm of right ovary: Secondary | ICD-10-CM | POA: Diagnosis not present

## 2015-08-01 DIAGNOSIS — R971 Elevated cancer antigen 125 [CA 125]: Secondary | ICD-10-CM

## 2015-08-01 DIAGNOSIS — Z5189 Encounter for other specified aftercare: Secondary | ICD-10-CM | POA: Diagnosis not present

## 2015-08-01 MED ORDER — TBO-FILGRASTIM 300 MCG/0.5ML ~~LOC~~ SOSY
300.0000 ug | PREFILLED_SYRINGE | Freq: Once | SUBCUTANEOUS | Status: AC
  Administered 2015-08-01: 300 ug via SUBCUTANEOUS
  Filled 2015-08-01: qty 0.5

## 2015-08-06 ENCOUNTER — Telehealth: Payer: Self-pay | Admitting: *Deleted

## 2015-08-06 NOTE — Telephone Encounter (Signed)
Told [name] to discuss the flu shot at her visit with Dr. Marko Plume on 08-14-15. [name] verbalized understanding.

## 2015-08-06 NOTE — Telephone Encounter (Signed)
"  I am currently under chemotherapy.  Are you going to give me the flu shot or do I go to my PCP?"  Return number [phone].  F/U on 08-14-2015 with chemotherapy on 08-18-2015.

## 2015-08-07 ENCOUNTER — Ambulatory Visit (INDEPENDENT_AMBULATORY_CARE_PROVIDER_SITE_OTHER): Payer: Medicare Other | Admitting: Pharmacist Clinician (PhC)/ Clinical Pharmacy Specialist

## 2015-08-07 ENCOUNTER — Encounter: Payer: Self-pay | Admitting: Pharmacist Clinician (PhC)/ Clinical Pharmacy Specialist

## 2015-08-07 VITALS — BP 160/92 | HR 64 | Ht 63.0 in | Wt 135.9 lb

## 2015-08-07 DIAGNOSIS — I10 Essential (primary) hypertension: Secondary | ICD-10-CM | POA: Diagnosis not present

## 2015-08-07 MED ORDER — AMLODIPINE BESYLATE 10 MG PO TABS: 10.0000 mg | ORAL_TABLET | Freq: Every day | ORAL | Status: DC

## 2015-08-07 MED ORDER — VALSARTAN 320 MG PO TABS: 320.0000 mg | ORAL_TABLET | Freq: Every day | ORAL | Status: DC

## 2015-08-07 NOTE — Assessment & Plan Note (Addendum)
Unfortunately her BP did not drop significantly with the addition of amlodipine 5 mg daily.  Today she is at 160/92 in the office.  Her home BP cuff is accurate to within 10 points.  Will increase amlodipine to 10 mg each evening and switch losartan 50 mg bid to valsartan 320 mg qd.  She will continue with daily home BP checks.  I have asked her to call in about 7-10 days to report on her BP readings.

## 2015-08-07 NOTE — Progress Notes (Signed)
08/07/2015 [name] [birth year] [id number]   HPI:  [name] is a 74 y.o. female patient of Dr harding, with a PMH below who presents today for hypertension follow up.   She is currently being followed by the cancer center at [facility] for a recurrence of ovarian cancer.  Her chemo regimen includes Avastin, which she believes is the cause of her recent increases in blood pressure.  She is also on high dose dexamethasone prior to treatment.   Her Avastin is currently being held until her BP comes down.   When I saw her 3 weeks ago she was started on amlodipine 5 mg daily in addition to her losartan 50 mg bid and metoprolol 50 mg bid.  Since then, her BP has not dropped significantly enough to resume the Avastin.  Cardiac Hx: previously had hypotension with her first round of chemotherapy 2 years ago; AF  Family Hx: mom - hypertension in her 67s; dad died at 77 of aortic aneurysm  Social Hx: no tobacco or alcohol, does not drink caffeine  Diet: does add salt to some foods or when cooking, does not consider her diet to be high in sodium  Home BP readings:  144-177/87-98 (mornings)  146-184/83-88 (evenings)  Current antihypertensive medications: losartan 50 mg bid, metoprolol 50 mg qd, amlodipine 5 mg qd   Current Outpatient Prescriptions  Medication Sig Dispense Refill  . amLODipine (NORVASC) 10 MG tablet Take 1 tablet (10 mg total) by mouth daily. 30 tablet 3  . amoxicillin (AMOXIL) 500 MG capsule Piror to dental procedures    . aspirin EC 81 MG tablet Take 81 mg by mouth every morning.     . bisacodyl (DULCOLAX) 5 MG EC tablet Take 5 mg by mouth daily as needed for moderate constipation.    . clopidogrel (PLAVIX) 75 MG tablet TAKE 1 TABLET BY MOUTH AT BEDTIME (Patient taking differently: Take 1 tablet by mouth at bedtime.) 90 tablet 3  . desonide (DESOWEN) 0.05 % cream Apply 1 application topically 2 (two) times daily as needed (rash).   1  . dexamethasone (DECADRON) 4 MG  tablet Take 5 tabs with food 12 hrs and 6 hrs prior to chemotherapy. Take an additional tab daily for 3 days beginning the 2nd day after chemotherapy. 16 tablet 0  . docusate sodium (COLACE) 100 MG capsule Take 100 mg by mouth 2 (two) times daily as needed for mild constipation.     Marland Kitchen estradiol (ESTRACE) 1 MG tablet Take 1 tablet (1 mg total) by mouth every morning. PT STATES SHE WANTS ESTRACE--NOT THE GENERIC 90 tablet 3  . Glucosamine HCl (GLUCOSAMINE PO) Take 1,000 mg by mouth every morning.     Marland Kitchen levothyroxine (SYNTHROID, LEVOTHROID) 88 MCG tablet Take 88 mcg by mouth every morning. Takes on an empty stomach. PT STATES SHE NEEDS THE SYNTHROID--DOES NOT WANT THE GENERIC    . lidocaine-prilocaine (EMLA) cream Apply to Porta-Cath site 1-2 hours prior to access as directed. 30 g 2  . loratadine (CLARITIN) 10 MG tablet Take 10 mg by mouth daily as needed for allergies.    Marland Kitchen LORazepam (ATIVAN) 1 MG tablet Place 1/2 -1 tablet under the tongue or swallow every 6 hrs as needed for nausea. Will make drowsy. 20 tablet 0  . losartan (COZAAR) 50 MG tablet Take 1 tablet (50 mg total) by mouth 2 (two) times daily. 180 tablet 3  . magnesium hydroxide (MILK OF MAGNESIA) 800 MG/5ML suspension Take 30 mLs  by mouth daily as needed for constipation.    . Menthol, Topical Analgesic, (ICY HOT EX) Apply 1 application topically daily as needed (pain.).    Marland Kitchen metoprolol tartrate (LOPRESSOR) 25 MG tablet Take 2 tablets by mouth 2 (two) times daily. Take 2 tabs twice a day    . Multiple Vitamin (MULTIVITAMIN WITH MINERALS) TABS tablet Take 1 tablet by mouth every morning.     . ondansetron (ZOFRAN) 8 MG tablet Take 1 tablet (8 mg total) by mouth every 8 (eight) hours as needed for nausea or vomiting (Will not make drowsy). 30 tablet 1  . pantoprazole (PROTONIX) 40 MG tablet Take 1 tablet (40 mg total) by mouth daily. For GERD 30 tablet 3  . Polyethyl Glycol-Propyl Glycol (SYSTANE OP) Place 1 drop into both eyes 2 (two) times  daily.     . polyethylene glycol (MIRALAX / GLYCOLAX) packet Take 17 g by mouth every morning.     . potassium chloride (K-DUR) 10 MEQ tablet Take 10 mEq by mouth every Monday, Wednesday, and Friday.    . propafenone (RYTHMOL) 225 MG tablet Take 1 tablet (225 mg total) by mouth 2 (two) times daily. 180 tablet 3  . valsartan (DIOVAN) 320 MG tablet Take 1 tablet (320 mg total) by mouth daily. 30 tablet 3   No current facility-administered medications for this visit.    Allergies  Allergen Reactions  . Ceftin [Cefuroxime Axetil]     Interferes with propafenone.    . Codeine Other (See Comments)    Does not like the feeling she gets  . Flexeril [Cyclobenzaprine] Other (See Comments)    Pt states "increased heart rate"  . Levaquin [Levofloxacin In D5w]     Interferes with propafenone.   . Lisinopril     LIP NUMBNESS  . Tegaderm Ag Mesh [Silver]   . Tape Rash    TEGADERM.   (use opsite on PAC)  . Ultram [Tramadol] Palpitations    Past Medical History  Diagnosis Date  . Hypertension   . Dyslipidemia   . Hypothyroidism   . Arthritis     OSTEOARTHRITIS   -- CONSTANT PAIN RIGHT HIP---AND PAIN LEFT KNEE--PT STATES SHE GETS INJECTIONS INTO HER KNEE  . PAF (paroxysmal atrial fibrillation)     Only on Plavix -- not full Anticoagulation per pt. request  . Complication of anesthesia     BLOOD PRESSURE DROPPED WITH NASAL SURGERY, ONE OF THE CARPAL TUNNEL REPAIRS AND DURING A COLONOSCOPY  . History of skin cancer   . Constipation   . Ovarian cancer 01/2013    Recurrence since 2014/2016    Blood pressure 160/92, pulse 64, height 5\' 3"  (1.6 m), weight 135 lb 14.4 oz (61.644 kg).    Tommy Medal PharmD CPP [facility]

## 2015-08-07 NOTE — Patient Instructions (Signed)
Call in 7-10 days with a report on your home BP readings  Your blood pressure today is 160/92   Check your blood pressure at home daily and keep record of the readings.  Take your BP meds as follows: switch losartan to valsartan 320 mg once daily in the mornings,  Increase amlodipine to 10 mg each evening  Bring your record of home blood pressures to your next appointment.  Exercise as you're able, try to walk approximately 30 minutes per day.  Keep salt intake to a minimum, especially watch canned and prepared boxed foods.  Eat more fresh fruits and vegetables and fewer canned items.  Avoid eating in fast food restaurants.    HOW TO TAKE YOUR BLOOD PRESSURE: . Rest 5 minutes before taking your blood pressure. .  Don't smoke or drink caffeinated beverages for at least 30 minutes before. . Take your blood pressure before (not after) you eat. . Sit comfortably with your back supported and both feet on the floor (don't cross your legs). . Elevate your arm to heart level on a table or a desk. . Use the proper sized cuff. It should fit smoothly and snugly around your bare upper arm. There should be enough room to slip a fingertip under the cuff. The bottom edge of the cuff should be 1 inch above the crease of the elbow. . Ideally, take 3 measurements at one sitting and record the average.

## 2015-08-13 ENCOUNTER — Other Ambulatory Visit: Payer: Self-pay | Admitting: Oncology

## 2015-08-13 DIAGNOSIS — C7989 Secondary malignant neoplasm of other specified sites: Secondary | ICD-10-CM

## 2015-08-13 DIAGNOSIS — C569 Malignant neoplasm of unspecified ovary: Secondary | ICD-10-CM

## 2015-08-14 ENCOUNTER — Ambulatory Visit (HOSPITAL_BASED_OUTPATIENT_CLINIC_OR_DEPARTMENT_OTHER): Payer: Medicare Other

## 2015-08-14 ENCOUNTER — Encounter: Payer: Self-pay | Admitting: Oncology

## 2015-08-14 ENCOUNTER — Telehealth: Payer: Self-pay | Admitting: Oncology

## 2015-08-14 ENCOUNTER — Telehealth: Payer: Self-pay | Admitting: Pharmacist Clinician (PhC)/ Clinical Pharmacy Specialist

## 2015-08-14 ENCOUNTER — Telehealth: Payer: Self-pay | Admitting: *Deleted

## 2015-08-14 ENCOUNTER — Ambulatory Visit (HOSPITAL_BASED_OUTPATIENT_CLINIC_OR_DEPARTMENT_OTHER): Payer: Medicare Other | Admitting: Oncology

## 2015-08-14 VITALS — BP 132/61 | HR 66 | Temp 98.1°F | Resp 18 | Ht 63.0 in | Wt 134.6 lb

## 2015-08-14 DIAGNOSIS — C569 Malignant neoplasm of unspecified ovary: Secondary | ICD-10-CM | POA: Diagnosis not present

## 2015-08-14 DIAGNOSIS — C562 Malignant neoplasm of left ovary: Secondary | ICD-10-CM | POA: Diagnosis not present

## 2015-08-14 DIAGNOSIS — Z95828 Presence of other vascular implants and grafts: Secondary | ICD-10-CM

## 2015-08-14 DIAGNOSIS — C7989 Secondary malignant neoplasm of other specified sites: Secondary | ICD-10-CM

## 2015-08-14 DIAGNOSIS — I10 Essential (primary) hypertension: Secondary | ICD-10-CM

## 2015-08-14 DIAGNOSIS — I158 Other secondary hypertension: Secondary | ICD-10-CM

## 2015-08-14 DIAGNOSIS — Z9889 Other specified postprocedural states: Secondary | ICD-10-CM | POA: Diagnosis not present

## 2015-08-14 DIAGNOSIS — G620 Drug-induced polyneuropathy: Secondary | ICD-10-CM

## 2015-08-14 DIAGNOSIS — T451X5A Adverse effect of antineoplastic and immunosuppressive drugs, initial encounter: Secondary | ICD-10-CM

## 2015-08-14 DIAGNOSIS — C561 Malignant neoplasm of right ovary: Secondary | ICD-10-CM

## 2015-08-14 DIAGNOSIS — D701 Agranulocytosis secondary to cancer chemotherapy: Secondary | ICD-10-CM

## 2015-08-14 DIAGNOSIS — G622 Polyneuropathy due to other toxic agents: Secondary | ICD-10-CM

## 2015-08-14 DIAGNOSIS — Z23 Encounter for immunization: Secondary | ICD-10-CM

## 2015-08-14 DIAGNOSIS — T50905A Adverse effect of unspecified drugs, medicaments and biological substances, initial encounter: Secondary | ICD-10-CM

## 2015-08-14 LAB — CBC WITH DIFFERENTIAL/PLATELET
BASO%: 0.6 % (ref 0.0–2.0)
BASOS ABS: 0 10*3/uL (ref 0.0–0.1)
EOS ABS: 0 10*3/uL (ref 0.0–0.5)
EOS%: 0.3 % (ref 0.0–7.0)
HCT: 33.5 % — ABNORMAL LOW (ref 34.8–46.6)
HGB: 11.4 g/dL — ABNORMAL LOW (ref 11.6–15.9)
LYMPH%: 47.9 % (ref 14.0–49.7)
MCH: 34 pg (ref 25.1–34.0)
MCHC: 34 g/dL (ref 31.5–36.0)
MCV: 100 fL (ref 79.5–101.0)
MONO#: 0.5 10*3/uL (ref 0.1–0.9)
MONO%: 16 % — AB (ref 0.0–14.0)
NEUT#: 1.2 10*3/uL — ABNORMAL LOW (ref 1.5–6.5)
NEUT%: 35.2 % — AB (ref 38.4–76.8)
Platelets: 185 10*3/uL (ref 145–400)
RBC: 3.35 10*6/uL — AB (ref 3.70–5.45)
RDW: 14.7 % — ABNORMAL HIGH (ref 11.2–14.5)
WBC: 3.4 10*3/uL — ABNORMAL LOW (ref 3.9–10.3)
lymph#: 1.6 10*3/uL (ref 0.9–3.3)

## 2015-08-14 LAB — COMPREHENSIVE METABOLIC PANEL (CC13)
ALBUMIN: 3.7 g/dL (ref 3.5–5.0)
ALK PHOS: 63 U/L (ref 40–150)
ALT: 10 U/L (ref 0–55)
ANION GAP: 6 meq/L (ref 3–11)
AST: 14 U/L (ref 5–34)
BUN: 10.4 mg/dL (ref 7.0–26.0)
CO2: 31 meq/L — AB (ref 22–29)
Calcium: 9.3 mg/dL (ref 8.4–10.4)
Chloride: 104 mEq/L (ref 98–109)
Creatinine: 0.6 mg/dL (ref 0.6–1.1)
EGFR: 89 mL/min/{1.73_m2} — AB (ref >90)
GLUCOSE: 79 mg/dL (ref 70–140)
POTASSIUM: 3.7 meq/L (ref 3.5–5.1)
SODIUM: 140 meq/L (ref 136–145)
Total Bilirubin: 0.55 mg/dL (ref 0.20–1.20)
Total Protein: 6.2 g/dL — ABNORMAL LOW (ref 6.4–8.3)

## 2015-08-14 LAB — UA PROTEIN, DIPSTICK - CHCC: Protein, ur: NEGATIVE mg/dL

## 2015-08-14 MED ORDER — DEXAMETHASONE 4 MG PO TABS: ORAL_TABLET | ORAL | Status: DC

## 2015-08-14 MED ORDER — LORAZEPAM 1 MG PO TABS: ORAL_TABLET | ORAL | Status: DC

## 2015-08-14 NOTE — Telephone Encounter (Signed)
Confirmed appointment change for 09/26 with chemo.

## 2015-08-14 NOTE — Progress Notes (Signed)
OFFICE PROGRESS NOTE   August 14, 2015   Physicians:D. ClarkePearson; J.Russo, T.Fontaine, Glenetta Hew (cardiology), [name], D.Jacobs, Zollie Beckers  INTERVAL HISTORY:  Patient is seen, alone for visit, in continuing attention to chemotherapy + avastin in process for recurrent bilateral ovarian carcinoma involving pelvis. She had cycle 5 carbo taxol on 07-28-15 with gCSF x 4 doses; Avastin was given with cycles 2-3-4, but was held cycle 5 due to hypertension. She has had changes in antihypertensive medications by cardiology since then, with improvement. Plan is to repeat scans and have follow up with gyn oncology after cycle 6. With ANC just 1.2 today, will delay cycle 6 x 1 week.   Patient is tolerating chemotherapy much better since decadron added x 4 days after each treatment. Nausea is improved and po intake including fluids is better, such that she does not want additional IVF day after chemo as we have done recently; she is instructed to call if she cannot take fluids well. She has felt well enough to walk daily outdoors, notices that she can do this more briskly now. She denies abdominal or pelvic pain. Bowels are moving daily, mineral oil helpful in addition to usual colace and miralax (chronic constipation preceded cancer diagnosis also). She has numbness in great toes and second toes bilaterally, which is not too bothersome, nothing worse in hands. She has had no bleeding and no LE swelling.     PAC placed by IR 04-21-15 Genetics testing with BreastOvarian panel by [facility] sent 06-26-15, results anticipated in 2-3 weeks. CA 125 on 05-01-15 33  She and husband keep granddaughter on Thursdays.  ONCOLOGIC HISTORY Patient presented to Dr Abbie Sons in late Jan 2014 after bright red spotting that AM following 2 weeks of spotting in early Dec 2015. with uterus larger than had been apparent on exam in Oct 2013. Sonohystogram 01-05-13 showed uterus normal size and echotexture, endometrium  4.3 mm, left ovary normal and right adnexa with 1.1 x 8.4.x8.2 cm cystic and solid mass. Endometrial biopsy benign and CA 125 also on 01-05-13 was 178.8. She had CT AP 01-17-13 with 1.0 x 6.9 x 8.9 cm complex right ovarian mass, no ascites, small retroperitoneal nodes. She was seen by Dr Skeet Latch on 01-18-13 and taken to surgery by Dr Josephina Shih on 02-13-13, which was TAH/BSO/ omentectomy/ureterolysis/ resection of cul-de-sac tumor/right pelvic lymphadnectomy and resection of rectum with reanastomosis. At completion of surgery there was no gross residual disease. 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 [facility] [phone]) had rare clusters of atypical cells. Chemotherapy with dose dense taxol carboplatin was begun day 1 cycle 1 on 03-20-13; ANC was 1.1 on day 15 cycle 1 with taxol given and neupogen added 04-04-13. She had day 1 cycle 2 treatment on 04-17-13, then was briefly hospitalized 5-22 to 04-20-13 after syncopal episode, with antihypertensive agents DCd and UTI treated. She was feeling much better at time of "day 8" cycle 2 on 05-01-13 and did have neupogen 300 mcg x 1 dose on 05-02-13. She was readmitted to hospital 6-5 thru 05-05-13 with fever, empirically on antibiotics and blood cultures negative. Cycle 6 completed 08-14-13. CT AP 09-25-13 had no evidence of cancer and CA 125 was 18.3 on 07-24-2013. Marker was 20 in early Dec 2015, 26 on 01-31-15 and 35 on 03-14-15. PET 04-03-15  documented disease bilateral pelvic sidewalls, vaginal cuff and scattered areas thru abdomen/ pelvis, as well as area of uptake in spleen. She resumed carboplatin taxol using q 3 week regimen on 05-01-15. She was neutropenic with ANC 0.3 on day 9 despite beginning granix on day 5. Avastin  added with cycle 2 chemo on 05-26-15. CT AP 07-12-15 with improvement in splenic lesion and pelvic adenopathy, still area at posterior stomach, no new involvement; CA 125 down to 14 prior to cycle 4.    Review of systems as above, also: No fever or symptoms of infection. Voiding without difficulty. No problems with PAC. No chest pain. No symptoms of blood clots. No SOB. No LE swelling. "I am less depressed since energy is better" Remainder of 10 point Review of Systems negative.  Objective:  Vital signs in last 24 hours:  BP 132/61 mmHg  Pulse 66  Temp(Src) 98.1 F (36.7 C) (Oral)  Resp 18  Ht 5' 3"  (1.6 m)  Wt 134 lb 9.6 oz (61.054 kg)  BMI 23.85 kg/m2 Weight up 1 lb. Looks comfortable, more cheerful Alert, oriented and appropriate. Ambulatory without difficulty.  HEENT:PERRL, sclerae not icteric. Oral mucosa moist without lesions, posterior pharynx clear.  Neck supple. No JVD.  Lymphatics:no cervical,supraclavicular or inguinal adenopathy Resp: clear to auscultation bilaterally and normal percussion bilaterally Cardio: slightly irregular rate and rhythm. No gallop. GI: soft, nontender, not distended, no mass or organomegaly. Normally active bowel sounds. Surgical incision not remarkable. Musculoskeletal/ Extremities: without pitting edema, cords, tenderness Neuro:  peripheral neuropathy as noted. Otherwise nonfocal. PSYCH as above Skin without rash, ecchymosis, petechiae Portacath-without erythema or tenderness  Lab Results:  Results for orders placed or performed in visit on 08/14/15  CBC with Differential  Result Value Ref Range   WBC 3.4 (L) 3.9 - 10.3 10e3/uL   NEUT# 1.2 (L) 1.5 - 6.5 10e3/uL   HGB 11.4 (L) 11.6 - 15.9 g/dL   HCT 33.5 (L) 34.8 - 46.6 %   Platelets 185 145 - 400 10e3/uL   MCV 100.0 79.5 - 101.0 fL   MCH 34.0 25.1 - 34.0 pg   MCHC 34.0 31.5 - 36.0 g/dL   RBC 3.35 (L) 3.70 - 5.45 10e6/uL   RDW 14.7 (H) 11.2 - 14.5 %   lymph# 1.6 0.9 - 3.3 10e3/uL    MONO# 0.5 0.1 - 0.9 10e3/uL   Eosinophils Absolute 0.0 0.0 - 0.5 10e3/uL   Basophils Absolute 0.0 0.0 - 0.1 10e3/uL   NEUT% 35.2 (L) 38.4 - 76.8 %   LYMPH% 47.9 14.0 - 49.7 %   MONO% 16.0 (H) 0.0 - 14.0 %   EOS% 0.3 0.0 - 7.0 %   BASO% 0.6 0.0 - 2.0 %  Comprehensive metabolic panel (Cmet) - [facility]  Result Value Ref Range   Sodium 140 136 - 145 mEq/L   Potassium 3.7 3.5 - 5.1 mEq/L   Chloride 104 98 - 109 mEq/L   CO2 31 (H) 22 - 29 mEq/L   Glucose 79 70 - 140 mg/dl   BUN 10.4 7.0 - 26.0 mg/dL   Creatinine 0.6 0.6 - 1.1 mg/dL   Total Bilirubin 0.55 0.20 - 1.20 mg/dL   Alkaline Phosphatase 63 40 - 150 U/L   AST 14 5 - 34 U/L   ALT 10 0 - 55 U/L   Total Protein 6.2 (L) 6.4 - 8.3 g/dL   Albumin 3.7 3.5 - 5.0 g/dL   Calcium 9.3 8.4 - 10.4 mg/dL   Anion Gap 6 3 -  11 mEq/L   EGFR 89 (L) >90 ml/min/1.73 m2  Urine protein by dipstick - [facility]  Result Value Ref Range   Protein, ur Negative Negative- <30 mg/dL     Studies/Results:  No results found.  Medications: I have reviewed the patient's current medications, now on amlodipine 10 mg daily and valsartan instead of losartan.  She prefers several days of granix to one time neulasta.  DISCUSSION: ANC only 1.2 today so will delay cycle 6 x l week, until 08-25-15, with Avastin also as long as BP <=150/80. She will have granix 9-27,28,29,30 and po decadron also those 4 days. No IVF/ no IV decadron on day 2.  Will get CT AP shortly prior to seeing Dr Josephina Shih on Oct 14 Will give flu vaccine on 10-14 coordinating with gyn oncology visit.  Patient is in agreement with receiving cycle 6, understands that peripheral neuropathy may increase, knows to call if needs IVF. She is aware that recommendation depending on restaging results could be to continue with chemo and/or avastin.  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 Gradual increase in CA125 March and April 2016 with PET evidence of progressive disease pelvis and abdomen, asymptomatic. Resumed chemotherapy with carboplatin and taxol using q 3 week dosing, cycle 1 on 05-01-15. Addition of avastin with cycle 2 on 05-26-15, held avastin cycle 5 with HTN. Tolerating well now and BP better, plan cycle 6 with one week delay on 08-25-15 if ANC >=1.5 and plt >=100k and carbo skin test negative, with Avastin if BP allows. Repeat scans prior to gyn oncology follow up on 09-12-15. 2.PAC in, will need flush every 6-8 weeks if not otherwise used.  3.HTN: likely exacerbated by Avastin. Better with med changes by cardiology 4.paroxysmal Afib: followed by Dr Ellyn Hack  Note on plavix and ASA if platelets drop with chemo.  5.chronic constipation requiring increased laxatives with chemo, up to date on colonoscopy.  6.hypothyroid on replacement by PCP 7. Left knee replacement 12-2014, doing well. Right hip replacement 2013 for osteoarthritis 8. Long estrogen replacement, which she still uses. She has previously refused to discontinue the estrogen. Initial surgical path from 02-13-13 does not report ER PR testing. 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 Breast tissue heterogeneously dense by last tomo mammograms at [facility] 03-21-15, those mammograms otherwise negative.  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 GERD: better with protonix, continue 12.needs flu vaccine this fall, better to do a little out from chemo if possible. Plan to give Oct 14.   All questions answered and patient is in agreement with plans as above. Chemo and avastin orders moved, parameters in place. I will see her at least late Oct when due PAC flush, or sooner if needed including for additional chemo depending on restaging upcoming. Time spent 25 min including >50% counseling and coordination of care. Cc Dr  Esaw Dace, MD   08/14/2015, 2:27 PM

## 2015-08-14 NOTE — Telephone Encounter (Signed)
Gave avs & calendar for September/October. Sent message to move treatment per pof.

## 2015-08-14 NOTE — Telephone Encounter (Signed)
Patient calling in BP readings for past week:     AM                     PM 148/90  144/81 135/75  159/75 152/86  147/77 134/77  151/83 145/79  148/75 146/81  150/76  145/79  Returned call, patient went to cancer center today and BP was 132/61.  Patient is happy with decrease in pressures.  Discussed concern that chemo treatments may continue to push BP up, but for now we will leave her on her current regimen.  She is to call if readings become consistently elevated >170 systolic or has other concerns.

## 2015-08-14 NOTE — Telephone Encounter (Signed)
Per staff message and POF I have scheduled appts. Advised scheduler of appts and to move labs. JMW  

## 2015-08-15 ENCOUNTER — Telehealth: Payer: Self-pay

## 2015-08-15 LAB — CA 125: CA 125: 12 U/mL (ref <35)

## 2015-08-15 NOTE — Telephone Encounter (Signed)
Told [name] the results of her CA-125 as noted below by Dr. Marko Plume.

## 2015-08-15 NOTE — Telephone Encounter (Signed)
-----   Message from Gordy Levan, MD sent at 08/15/2015  7:30 AM EDT ----- Labs seen and need follow up: please let her know CA 125 marker is in good normal range, 12

## 2015-08-17 ENCOUNTER — Other Ambulatory Visit: Payer: Self-pay

## 2015-08-17 DIAGNOSIS — Z23 Encounter for immunization: Secondary | ICD-10-CM

## 2015-08-17 MED ORDER — INFLUENZA VAC SPLIT QUAD 0.5 ML IM SUSY: 0.5000 mL | PREFILLED_SYRINGE | INTRAMUSCULAR | Status: DC

## 2015-08-18 ENCOUNTER — Ambulatory Visit: Payer: Medicare Other

## 2015-08-18 ENCOUNTER — Other Ambulatory Visit: Payer: Medicare Other

## 2015-08-19 ENCOUNTER — Ambulatory Visit: Payer: Medicare Other

## 2015-08-20 ENCOUNTER — Ambulatory Visit: Payer: Medicare Other

## 2015-08-21 ENCOUNTER — Ambulatory Visit: Payer: Medicare Other

## 2015-08-22 ENCOUNTER — Ambulatory Visit: Payer: Medicare Other

## 2015-08-25 ENCOUNTER — Other Ambulatory Visit: Payer: Self-pay | Admitting: *Deleted

## 2015-08-25 ENCOUNTER — Other Ambulatory Visit (HOSPITAL_BASED_OUTPATIENT_CLINIC_OR_DEPARTMENT_OTHER): Payer: Medicare Other

## 2015-08-25 ENCOUNTER — Ambulatory Visit (HOSPITAL_BASED_OUTPATIENT_CLINIC_OR_DEPARTMENT_OTHER): Payer: Medicare Other

## 2015-08-25 ENCOUNTER — Ambulatory Visit: Payer: Medicare Other

## 2015-08-25 VITALS — BP 140/66 | HR 66

## 2015-08-25 DIAGNOSIS — C569 Malignant neoplasm of unspecified ovary: Secondary | ICD-10-CM

## 2015-08-25 DIAGNOSIS — C561 Malignant neoplasm of right ovary: Secondary | ICD-10-CM

## 2015-08-25 DIAGNOSIS — C562 Malignant neoplasm of left ovary: Secondary | ICD-10-CM

## 2015-08-25 DIAGNOSIS — Z5111 Encounter for antineoplastic chemotherapy: Secondary | ICD-10-CM | POA: Diagnosis not present

## 2015-08-25 DIAGNOSIS — Z5112 Encounter for antineoplastic immunotherapy: Secondary | ICD-10-CM | POA: Diagnosis not present

## 2015-08-25 DIAGNOSIS — C7989 Secondary malignant neoplasm of other specified sites: Secondary | ICD-10-CM

## 2015-08-25 LAB — CBC WITH DIFFERENTIAL/PLATELET
BASO%: 0.4 % (ref 0.0–2.0)
BASOS ABS: 0 10*3/uL (ref 0.0–0.1)
EOS%: 0 % (ref 0.0–7.0)
Eosinophils Absolute: 0 10*3/uL (ref 0.0–0.5)
HEMATOCRIT: 37.9 % (ref 34.8–46.6)
HGB: 12.9 g/dL (ref 11.6–15.9)
LYMPH%: 7.1 % — ABNORMAL LOW (ref 14.0–49.7)
MCH: 34.1 pg — AB (ref 25.1–34.0)
MCHC: 34 g/dL (ref 31.5–36.0)
MCV: 100.3 fL (ref 79.5–101.0)
MONO#: 0.1 10*3/uL (ref 0.1–0.9)
MONO%: 1.1 % (ref 0.0–14.0)
NEUT#: 6.9 10*3/uL — ABNORMAL HIGH (ref 1.5–6.5)
NEUT%: 91.4 % — AB (ref 38.4–76.8)
PLATELETS: 144 10*3/uL — AB (ref 145–400)
RBC: 3.77 10*6/uL (ref 3.70–5.45)
RDW: 15 % — ABNORMAL HIGH (ref 11.2–14.5)
WBC: 7.6 10*3/uL (ref 3.9–10.3)
lymph#: 0.5 10*3/uL — ABNORMAL LOW (ref 0.9–3.3)

## 2015-08-25 LAB — COMPREHENSIVE METABOLIC PANEL (CC13)
ALT: 13 U/L (ref 0–55)
ANION GAP: 9 meq/L (ref 3–11)
AST: 15 U/L (ref 5–34)
Albumin: 4 g/dL (ref 3.5–5.0)
Alkaline Phosphatase: 75 U/L (ref 40–150)
BUN: 11.4 mg/dL (ref 7.0–26.0)
CALCIUM: 9.6 mg/dL (ref 8.4–10.4)
CHLORIDE: 102 meq/L (ref 98–109)
CO2: 28 mEq/L (ref 22–29)
Creatinine: 0.7 mg/dL (ref 0.6–1.1)
EGFR: 87 mL/min/{1.73_m2} — ABNORMAL LOW (ref >90)
Glucose: 136 mg/dl (ref 70–140)
POTASSIUM: 4 meq/L (ref 3.5–5.1)
Sodium: 138 mEq/L (ref 136–145)
Total Bilirubin: 0.58 mg/dL (ref 0.20–1.20)
Total Protein: 6.6 g/dL (ref 6.4–8.3)

## 2015-08-25 LAB — UA PROTEIN, DIPSTICK - CHCC: Protein, ur: <30 mg/dL

## 2015-08-25 MED ORDER — SODIUM CHLORIDE 0.9 % IV SOLN
Freq: Once | INTRAVENOUS | Status: AC
  Administered 2015-08-25: 15:00:00 via INTRAVENOUS
  Filled 2015-08-25: qty 8

## 2015-08-25 MED ORDER — DIPHENHYDRAMINE HCL 50 MG/ML IJ SOLN
INTRAMUSCULAR | Status: AC
  Filled 2015-08-25: qty 1

## 2015-08-25 MED ORDER — SODIUM CHLORIDE 0.9 % IV SOLN
300.0000 mg | Freq: Once | INTRAVENOUS | Status: AC
  Administered 2015-08-25: 300 mg via INTRAVENOUS
  Filled 2015-08-25: qty 30

## 2015-08-25 MED ORDER — DIPHENHYDRAMINE HCL 50 MG/ML IJ SOLN
25.0000 mg | Freq: Once | INTRAMUSCULAR | Status: AC
  Administered 2015-08-25: 25 mg via INTRAVENOUS

## 2015-08-25 MED ORDER — SODIUM CHLORIDE 0.9 % IV SOLN
Freq: Once | INTRAVENOUS | Status: AC
  Administered 2015-08-25: 13:00:00 via INTRAVENOUS

## 2015-08-25 MED ORDER — PACLITAXEL CHEMO INJECTION 300 MG/50ML
135.0000 mg/m2 | Freq: Once | INTRAVENOUS | Status: AC
  Administered 2015-08-25: 222 mg via INTRAVENOUS
  Filled 2015-08-25: qty 37

## 2015-08-25 MED ORDER — SODIUM CHLORIDE 0.9 % IV SOLN
15.0000 mg/kg | Freq: Once | INTRAVENOUS | Status: AC
  Administered 2015-08-25: 900 mg via INTRAVENOUS
  Filled 2015-08-25: qty 36

## 2015-08-25 MED ORDER — FAMOTIDINE IN NACL 20-0.9 MG/50ML-% IV SOLN
INTRAVENOUS | Status: AC
  Filled 2015-08-25: qty 50

## 2015-08-25 MED ORDER — CARBOPLATIN CHEMO INTRADERMAL TEST DOSE 100MCG/0.02ML
100.0000 ug | Freq: Once | INTRADERMAL | Status: AC
  Administered 2015-08-25: 100 ug via INTRADERMAL
  Filled 2015-08-25: qty 0.01

## 2015-08-25 MED ORDER — FAMOTIDINE IN NACL 20-0.9 MG/50ML-% IV SOLN
20.0000 mg | Freq: Once | INTRAVENOUS | Status: AC
  Administered 2015-08-25: 20 mg via INTRAVENOUS

## 2015-08-25 NOTE — Patient Instructions (Signed)
[facility] Discharge Instructions for Patients Receiving Chemotherapy  Today you received the following chemotherapy agents avastin, taxol, carboplatin  To help prevent nausea and vomiting after your treatment, we encourage you to take your nausea medication    If you develop nausea and vomiting that is not controlled by your nausea medication, call the clinic.   BELOW ARE SYMPTOMS THAT SHOULD BE REPORTED IMMEDIATELY:  *FEVER GREATER THAN 100.5 F  *CHILLS WITH OR WITHOUT FEVER  NAUSEA AND VOMITING THAT IS NOT CONTROLLED WITH YOUR NAUSEA MEDICATION  *UNUSUAL SHORTNESS OF BREATH  *UNUSUAL BRUISING OR BLEEDING  TENDERNESS IN MOUTH AND THROAT WITH OR WITHOUT PRESENCE OF ULCERS  *URINARY PROBLEMS  *BOWEL PROBLEMS  UNUSUAL RASH Items with * indicate a potential emergency and should be followed up as soon as possible.  Feel free to call the clinic you have any questions or concerns. The clinic phone number is [phone].  Please show the [facility] at check-in to the Emergency Department and triage nurse.  Bevacizumab injection What is this medicine? BEVACIZUMAB (be va SIZ yoo mab) is a chemotherapy drug. It targets a protein found in many cancer cell types, and halts cancer growth. This drug treats many cancers including non-small cell lung cancer, ovarian cancer, cervical cancer, and colon or rectal cancer. It is usually given with other chemotherapy drugs. This medicine may be used for other purposes; ask your health care provider or pharmacist if you have questions. COMMON BRAND NAME(S): Avastin What should I tell my health care provider before I take this medicine? They need to know if you have any of these conditions: -blood clots -heart disease, including heart failure, heart attack, or chest pain (angina) -high blood pressure -infection (especially a virus infection such as chickenpox, cold sores, or herpes) -kidney disease -lung disease -prior  chemotherapy with doxorubicin, daunorubicin, epirubicin, or other anthracycline type chemotherapy agents -recent or ongoing radiation therapy -recent surgery -stroke -an unusual or allergic reaction to bevacizumab, hamster proteins, mouse proteins, other medicines, foods, dyes, or preservatives -pregnant or trying to get pregnant -breast-feeding How should I use this medicine? This medicine is for infusion into a vein. It is given by a health care professional in a hospital or clinic setting. Talk to your pediatrician regarding the use of this medicine in children. Special care may be needed. Overdosage: If you think you have taken too much of this medicine contact a poison control center or emergency room at once. NOTE: This medicine is only for you. Do not share this medicine with others. What if I miss a dose? It is important not to miss your dose. Call your doctor or health care professional if you are unable to keep an appointment. What may interact with this medicine? Interactions are not expected. This list may not describe all possible interactions. Give your health care provider a list of all the medicines, herbs, non-prescription drugs, or dietary supplements you use. Also tell them if you smoke, drink alcohol, or use illegal drugs. Some items may interact with your medicine. What should I watch for while using this medicine? Your condition will be monitored carefully while you are receiving this medicine. You will need important blood work and urine testing done while you are taking this medicine. During your treatment, let your health care professional know if you have any unusual symptoms, such as difficulty breathing. This medicine may rarely cause 'gastrointestinal perforation' (holes in the stomach, intestines or colon), a serious side effect requiring surgery  to repair. This medicine should be started at least 28 days following major surgery and the site of the surgery should be  totally healed. Check with your doctor before scheduling dental work or surgery while you are receiving this treatment. Talk to your doctor if you have recently had surgery or if you have a wound that has not healed. Do not become pregnant while taking this medicine. Women should inform their doctor if they wish to become pregnant or think they might be pregnant. There is a potential for serious side effects to an unborn child. Talk to your health care professional or pharmacist for more information. Do not breast-feed an infant while taking this medicine. This medicine has caused ovarian failure in some women. This medicine may interfere with the ability to have a child. You should talk to your doctor or health care professional if you are concerned about your fertility. What side effects may I notice from receiving this medicine? Side effects that you should report to your doctor or health care professional as soon as possible: -allergic reactions like skin rash, itching or hives, swelling of the face, lips, or tongue -signs of infection - fever or chills, cough, sore throat, pain or trouble passing urine -signs of decreased platelets or bleeding - bruising, pinpoint red spots on the skin, black, tarry stools, nosebleeds, blood in the urine -breathing problems -changes in vision -chest pain -confusion -jaw pain, especially after dental work -mouth sores -seizures -severe abdominal pain -severe headache -sudden numbness or weakness of the face, arm or leg -swelling of legs or ankles -symptoms of a stroke: change in mental awareness, inability to talk or move one side of the body (especially in patients with lung cancer) -trouble passing urine or change in the amount of urine -trouble speaking or understanding -trouble walking, dizziness, loss of balance or coordination Side effects that usually do not require medical attention (report to your doctor or health care professional if they  continue or are bothersome): -constipation -diarrhea -dry skin -headache -loss of appetite -nausea, vomiting This list may not describe all possible side effects. Call your doctor for medical advice about side effects. You may report side effects to FDA at 1-800-FDA-1088. Where should I keep my medicine? This drug is given in a hospital or clinic and will not be stored at home. NOTE: This sheet is a summary. It may not cover all possible information. If you have questions about this medicine, talk to your doctor, pharmacist, or health care provider.  2015, [employer] Standard. (2013-10-16 11:38:34)

## 2015-08-25 NOTE — Progress Notes (Signed)
Patient needs to have a IV dye study due to port hurting. Port is pinkish/red. This RN was getting ready to access the port and the needle was barely touching the skin, and she was already yelling that it hurt. Note given to Dr. Mariana Kaufman nurse to obtain order for IV dye study.

## 2015-08-26 ENCOUNTER — Ambulatory Visit (HOSPITAL_BASED_OUTPATIENT_CLINIC_OR_DEPARTMENT_OTHER): Payer: Medicare Other

## 2015-08-26 ENCOUNTER — Other Ambulatory Visit: Payer: Self-pay | Admitting: Radiology

## 2015-08-26 ENCOUNTER — Ambulatory Visit (HOSPITAL_COMMUNITY)
Admission: RE | Admit: 2015-08-26 | Discharge: 2015-08-26 | Disposition: A | Discharge status: Home / Self Care | Payer: Medicare Other | Source: Ambulatory Visit | Attending: Oncology | Admitting: Oncology

## 2015-08-26 VITALS — BP 144/83 | HR 67 | Temp 98.2°F

## 2015-08-26 DIAGNOSIS — C569 Malignant neoplasm of unspecified ovary: Secondary | ICD-10-CM

## 2015-08-26 DIAGNOSIS — Z5189 Encounter for other specified aftercare: Secondary | ICD-10-CM | POA: Diagnosis not present

## 2015-08-26 DIAGNOSIS — Z452 Encounter for adjustment and management of vascular access device: Secondary | ICD-10-CM | POA: Insufficient documentation

## 2015-08-26 DIAGNOSIS — C561 Malignant neoplasm of right ovary: Secondary | ICD-10-CM

## 2015-08-26 DIAGNOSIS — R971 Elevated cancer antigen 125 [CA 125]: Secondary | ICD-10-CM

## 2015-08-26 DIAGNOSIS — C562 Malignant neoplasm of left ovary: Secondary | ICD-10-CM | POA: Diagnosis not present

## 2015-08-26 MED ORDER — TBO-FILGRASTIM 300 MCG/0.5ML ~~LOC~~ SOSY
300.0000 ug | PREFILLED_SYRINGE | Freq: Once | SUBCUTANEOUS | Status: AC
  Administered 2015-08-26: 300 ug via SUBCUTANEOUS
  Filled 2015-08-26: qty 0.5

## 2015-08-26 MED ORDER — IOHEXOL 300 MG/ML  SOLN
10.0000 mL | Freq: Once | INTRAMUSCULAR | Status: DC | PRN
  Administered 2015-08-26: 10 mL via INTRAVENOUS
  Filled 2015-08-26: qty 10

## 2015-08-26 MED ORDER — HEPARIN SOD (PORK) LOCK FLUSH 100 UNIT/ML IV SOLN
INTRAVENOUS | Status: AC
  Administered 2015-08-26: 500 [IU]
  Filled 2015-08-26: qty 5

## 2015-08-26 MED ORDER — HEPARIN SOD (PORK) LOCK FLUSH 100 UNIT/ML IV SOLN: 500.0000 [IU] | Freq: Once | INTRAVENOUS | Status: DC

## 2015-08-27 ENCOUNTER — Ambulatory Visit (HOSPITAL_BASED_OUTPATIENT_CLINIC_OR_DEPARTMENT_OTHER): Payer: Medicare Other

## 2015-08-27 VITALS — BP 132/65 | HR 65 | Temp 98.5°F

## 2015-08-27 DIAGNOSIS — Z5189 Encounter for other specified aftercare: Secondary | ICD-10-CM | POA: Diagnosis not present

## 2015-08-27 DIAGNOSIS — C562 Malignant neoplasm of left ovary: Secondary | ICD-10-CM

## 2015-08-27 DIAGNOSIS — C561 Malignant neoplasm of right ovary: Secondary | ICD-10-CM | POA: Diagnosis not present

## 2015-08-27 DIAGNOSIS — C569 Malignant neoplasm of unspecified ovary: Secondary | ICD-10-CM

## 2015-08-27 DIAGNOSIS — R971 Elevated cancer antigen 125 [CA 125]: Secondary | ICD-10-CM

## 2015-08-27 MED ORDER — TBO-FILGRASTIM 300 MCG/0.5ML ~~LOC~~ SOSY
300.0000 ug | PREFILLED_SYRINGE | Freq: Once | SUBCUTANEOUS | Status: AC
  Administered 2015-08-27: 300 ug via SUBCUTANEOUS
  Filled 2015-08-27: qty 0.5

## 2015-08-28 ENCOUNTER — Ambulatory Visit (HOSPITAL_BASED_OUTPATIENT_CLINIC_OR_DEPARTMENT_OTHER): Payer: Medicare Other

## 2015-08-28 VITALS — BP 153/75 | HR 72 | Temp 98.1°F

## 2015-08-28 DIAGNOSIS — Z5189 Encounter for other specified aftercare: Secondary | ICD-10-CM

## 2015-08-28 DIAGNOSIS — R971 Elevated cancer antigen 125 [CA 125]: Secondary | ICD-10-CM

## 2015-08-28 DIAGNOSIS — C561 Malignant neoplasm of right ovary: Secondary | ICD-10-CM | POA: Diagnosis not present

## 2015-08-28 DIAGNOSIS — C562 Malignant neoplasm of left ovary: Secondary | ICD-10-CM

## 2015-08-28 DIAGNOSIS — C569 Malignant neoplasm of unspecified ovary: Secondary | ICD-10-CM

## 2015-08-28 MED ORDER — TBO-FILGRASTIM 300 MCG/0.5ML ~~LOC~~ SOSY
300.0000 ug | PREFILLED_SYRINGE | Freq: Once | SUBCUTANEOUS | Status: AC
  Administered 2015-08-28: 300 ug via SUBCUTANEOUS
  Filled 2015-08-28: qty 0.5

## 2015-08-29 ENCOUNTER — Ambulatory Visit (HOSPITAL_BASED_OUTPATIENT_CLINIC_OR_DEPARTMENT_OTHER): Payer: Medicare Other

## 2015-08-29 VITALS — BP 141/70 | HR 64 | Temp 98.3°F

## 2015-08-29 DIAGNOSIS — C561 Malignant neoplasm of right ovary: Secondary | ICD-10-CM

## 2015-08-29 DIAGNOSIS — C562 Malignant neoplasm of left ovary: Secondary | ICD-10-CM

## 2015-08-29 DIAGNOSIS — Z5189 Encounter for other specified aftercare: Secondary | ICD-10-CM | POA: Diagnosis not present

## 2015-08-29 DIAGNOSIS — C569 Malignant neoplasm of unspecified ovary: Secondary | ICD-10-CM

## 2015-08-29 DIAGNOSIS — R971 Elevated cancer antigen 125 [CA 125]: Secondary | ICD-10-CM

## 2015-08-29 MED ORDER — TBO-FILGRASTIM 300 MCG/0.5ML ~~LOC~~ SOSY
300.0000 ug | PREFILLED_SYRINGE | Freq: Once | SUBCUTANEOUS | Status: AC
  Administered 2015-08-29: 300 ug via SUBCUTANEOUS
  Filled 2015-08-29: qty 0.5

## 2015-09-10 ENCOUNTER — Ambulatory Visit (HOSPITAL_COMMUNITY)
Admission: RE | Admit: 2015-09-10 | Discharge: 2015-09-10 | Disposition: A | Discharge status: Home / Self Care | Payer: Medicare Other | Source: Ambulatory Visit | Attending: Oncology | Admitting: Oncology

## 2015-09-10 ENCOUNTER — Encounter (HOSPITAL_COMMUNITY): Payer: Self-pay

## 2015-09-10 DIAGNOSIS — C7989 Secondary malignant neoplasm of other specified sites: Secondary | ICD-10-CM | POA: Insufficient documentation

## 2015-09-10 DIAGNOSIS — D7389 Other diseases of spleen: Secondary | ICD-10-CM | POA: Insufficient documentation

## 2015-09-10 DIAGNOSIS — C569 Malignant neoplasm of unspecified ovary: Secondary | ICD-10-CM | POA: Insufficient documentation

## 2015-09-10 DIAGNOSIS — I700 Atherosclerosis of aorta: Secondary | ICD-10-CM | POA: Diagnosis not present

## 2015-09-10 MED ORDER — IOHEXOL 300 MG/ML  SOLN
100.0000 mL | Freq: Once | INTRAMUSCULAR | Status: AC | PRN
  Administered 2015-09-10: 100 mL via INTRAVENOUS

## 2015-09-12 ENCOUNTER — Encounter: Payer: Self-pay | Admitting: Gynecology

## 2015-09-12 ENCOUNTER — Ambulatory Visit: Payer: Medicare Other | Attending: Gynecology | Admitting: Gynecology

## 2015-09-12 ENCOUNTER — Ambulatory Visit (HOSPITAL_BASED_OUTPATIENT_CLINIC_OR_DEPARTMENT_OTHER): Payer: Medicare Other

## 2015-09-12 VITALS — BP 161/76 | HR 63 | Temp 97.5°F | Resp 20

## 2015-09-12 VITALS — BP 136/66 | HR 60 | Temp 97.5°F | Resp 18 | Ht 63.0 in | Wt 134.3 lb

## 2015-09-12 DIAGNOSIS — Z23 Encounter for immunization: Secondary | ICD-10-CM

## 2015-09-12 DIAGNOSIS — C569 Malignant neoplasm of unspecified ovary: Secondary | ICD-10-CM | POA: Diagnosis present

## 2015-09-12 DIAGNOSIS — Z299 Encounter for prophylactic measures, unspecified: Secondary | ICD-10-CM

## 2015-09-12 MED ORDER — INFLUENZA VAC SPLIT QUAD 0.5 ML IM SUSY
0.5000 mL | PREFILLED_SYRINGE | Freq: Once | INTRAMUSCULAR | Status: AC
  Administered 2015-09-12: 0.5 mL via INTRAMUSCULAR
  Filled 2015-09-12: qty 0.5

## 2015-09-12 NOTE — Patient Instructions (Signed)

## 2015-09-12 NOTE — Progress Notes (Signed)
Consult Note: Gyn-Onc   [name] 74 y.o. female  No chief complaint on file.   Assessment and plan :   Stage IIb poorly differentiated ovarian cancer currently responding to therapy with carboplatin , Taxol, and a Avastin.   Treatment options were discussed with the patient and her husband. Options include possibility of resecting the only known site of disease (splenectomy) followed by consolidation a Avastin. The other option would be to continue chemotherapy for 3 additional cycles and reassess continuing response. After discussing the pros and cons of these the patient husband feel they would like to proceed with continuing chemotherapy. She has an appointment to see Dr. Marko Plume in a week. I would suggest to give 3 more cycles of chemotherapy and then reevaluate with another CT scan to see how the splenic lesion is responding.  Interval History:   The patient returns today as previously scheduled.  Since her last visit she's   Received 6 cycles of carboplatin, Taxol, and a Avastin for treatment of recurrent disease. (A Avastin was held at cycle 5 secondary to hypertension) her tumor markers have continued to fall and a recent CT scan showed only persistent disease in the spleen. That area was continued to respond and now measures 2.3 x 1 cm (previously 3.0 x 1.4 cm. There is also a 0.9 mm inferior splenic lesion.  The patient reports she is feeling well. Adjustments in her Decadron dose resulted in her feeling much better with much less fatigued.  . Otherwise she has no abdominal or pelvic symptoms.  HPI:The patient initially presented with a pelvic mass and elevated CA 125 (178 units per mL) She underwent exploratory laparotomy and debulking on 02/13/2013. Final pathology showed a poorly differentiated ovarian cancer involving both ovaries pelvic peritoneum and rectal muscularis. All gross disease was resected.  She then received 6 cycles of carboplatin and Taxol chemotherapy  completed in September 2014. At the completion of chemotherapy her CA 125 was 18 units per mL.   In May 2016 the patient CA-125 began to rise and a PET CT scan showed metastatic disease in the spleen and pelvic lymph nodes. Given the long platinum free interval , we retreated with carboplatin, Taxol, and a Avastin. She received 6 cycles of carboplatin and Taxol and a Avastin the last being administered in September 2016. She had a significant response with BCA of 125 falling to 12 units per mL in September. CT scan showed persistent disease in the spleen (which was actually responding)   Review of Systems:10 point review of systems is negative except as noted in interval history.   Vitals: Blood pressure 136/66, pulse 60, temperature 97.5 F (36.4 C), temperature source Oral, resp. rate 18, height 5\' 3"  (1.6 m), weight 134 lb 4.8 oz (60.918 kg), SpO2 99 %.  Physical Exam: General : The patient is a healthy woman in no acute distress.  HEENT: normocephalic, extraoccular movements normal; neck is supple without thyromegally  Lynphnodes: Supraclavicular and inguinal nodes not enlarged  Abdomen: Soft, non-tender, no ascites, no organomegally, no masses, no hernias , midline incision is healing well  Pelvic:    EGBUS: Normal female  Vagina bladder urethra: Normal  Vaginal cuff is well healed.  Cervix and uterus are surgically absent  Bimanual exam: No masses nodularity or fullness.  Rectovaginal exam confirms      Lower extremities: No edema or varicosities. Normal range of motion      Allergies  Allergen Reactions  . Ceftin [Cefuroxime Axetil]  Interferes with propafenone.    . Codeine Other (See Comments)    Does not like the feeling she gets  . Flexeril [Cyclobenzaprine] Other (See Comments)    Pt states "increased heart rate"  . Levaquin [Levofloxacin In D5w]     Interferes with propafenone.   . Lisinopril     LIP NUMBNESS  . Tegaderm Ag Mesh [Silver]   . Tape Rash     TEGADERM.   (use opsite on PAC)  . Ultram [Tramadol] Palpitations    Past Medical History  Diagnosis Date  . Hypertension   . Dyslipidemia   . Hypothyroidism   . Arthritis     OSTEOARTHRITIS   -- CONSTANT PAIN RIGHT HIP---AND PAIN LEFT KNEE--PT STATES SHE GETS INJECTIONS INTO HER KNEE  . PAF (paroxysmal atrial fibrillation) (HCC)     Only on Plavix -- not full Anticoagulation per pt. request  . Complication of anesthesia     BLOOD PRESSURE DROPPED WITH NASAL SURGERY, ONE OF THE CARPAL TUNNEL REPAIRS AND DURING A COLONOSCOPY  . History of skin cancer   . Constipation   . Ovarian cancer ([place]) 01/2013    Recurrence since 2014/2016    Past Surgical History  Procedure Laterality Date  . Bilateral carpal tunnel repair  2007  . Dilation and curettage of uterus  1969  . Surgery for ruptured ovarian cyst  1969  . Rhinoplasty for fractured nose  1986  . Left knee arthroscopy   2011  . Total hip arthroplasty  02/25/2012    Procedure: TOTAL HIP ARTHROPLASTY ANTERIOR APPROACH;  Surgeon: Mcarthur Rossetti, MD;  Location: [facility];  Service: Orthopedics;  Laterality: Right;  . Tonsillectomy  1962  . Laparotomy Bilateral 02/13/2013    Procedure: EXPLORATORY LAPAROTOMY TOTAL ABDOMINAL HYSTERECTOMY BILATERAL SALPINGO-OOPHORECTOMY, Partial Rectal Resection with Reanastamosis;  Surgeon: Alvino Chapel, MD;  Location: [facility];  Service: Gynecology;  Laterality: Bilateral;  . Omentectomy  02/13/2013    Procedure: OMENTECTOMY;  Surgeon: Alvino Chapel, MD;  Location: [facility];  Service: Gynecology;;  . Lymphadenectomy Right 02/13/2013    Procedure: PEVLIC  LYMPHADENECTOMY, DEBULKING right pelvic tumor nodules;  Surgeon: Alvino Chapel, MD;  Location: [facility];  Service: Gynecology;  Laterality: Right;  . Total abdominal hysterectomy  01/2013  . Transthoracic echocardiogram  May 2014    Normal LV size function. EF 60-65%. Grade 1 diastolic function. Mild MR and mildly elevated  PA pressures of 37 mmHg per  . Nm myoview ltd  June 2010     subbmaximal with no ischemia or infarction.  . Total knee arthroplasty Left 01/03/2015    Procedure: LEFT TOTAL KNEE ARTHROPLASTY;  Surgeon: Mcarthur Rossetti, MD;  Location: [facility];  Service: Orthopedics;  Laterality: Left;    Current Outpatient Prescriptions  Medication Sig Dispense Refill  . amLODipine (NORVASC) 10 MG tablet Take 1 tablet (10 mg total) by mouth daily. 30 tablet 3  . amoxicillin (AMOXIL) 500 MG capsule [place] to dental procedures    . aspirin EC 81 MG tablet Take 81 mg by mouth every morning.     . bisacodyl (DULCOLAX) 5 MG EC tablet Take 5 mg by mouth daily as needed for moderate constipation.    . clopidogrel (PLAVIX) 75 MG tablet TAKE 1 TABLET BY MOUTH AT BEDTIME (Patient taking differently: Take 1 tablet by mouth at bedtime.) 90 tablet 3  . desonide (DESOWEN) 0.05 % cream Apply 1 application topically 2 (two) times daily as needed (rash).   1  .  dexamethasone (DECADRON) 4 MG tablet Take 5 tabs with food 12 hrs and 6 hrs prior to chemotherapy. Take an additional tab daily for 4 days beginning the 2nd day after chemotherapy. 14 tablet 0  . docusate sodium (COLACE) 100 MG capsule Take 100 mg by mouth 2 (two) times daily as needed for mild constipation.     Marland Kitchen estradiol (ESTRACE) 1 MG tablet Take 1 tablet (1 mg total) by mouth every morning. PT STATES SHE WANTS ESTRACE--NOT THE GENERIC 90 tablet 3  . Glucosamine HCl (GLUCOSAMINE PO) Take 1,000 mg by mouth every morning.     Marland Kitchen levothyroxine (SYNTHROID, LEVOTHROID) 88 MCG tablet Take 88 mcg by mouth every morning. Takes on an empty stomach. PT STATES SHE NEEDS THE SYNTHROID--DOES NOT WANT THE GENERIC    . lidocaine-prilocaine (EMLA) cream Apply to Porta-Cath site 1-2 hours prior to access as directed. 30 g 2  . loratadine (CLARITIN) 10 MG tablet Take 10 mg by mouth daily as needed for allergies.    Marland Kitchen LORazepam (ATIVAN) 1 MG tablet Place 1/2 -1 tablet under the tongue  or swallow every 6 hrs as needed for nausea. Will make drowsy. 20 tablet 0  . magnesium hydroxide (MILK OF MAGNESIA) 800 MG/5ML suspension Take 30 mLs by mouth daily as needed for constipation.    . Menthol, Topical Analgesic, (ICY HOT EX) Apply 1 application topically daily as needed (pain.).    Marland Kitchen metoprolol tartrate (LOPRESSOR) 25 MG tablet Take 2 tablets by mouth 2 (two) times daily. Take 2 tabs twice a day    . Multiple Vitamin (MULTIVITAMIN WITH MINERALS) TABS tablet Take 1 tablet by mouth every morning.     . ondansetron (ZOFRAN) 8 MG tablet Take 1 tablet (8 mg total) by mouth every 8 (eight) hours as needed for nausea or vomiting (Will not make drowsy). (Patient not taking: Reported on 08/14/2015) 30 tablet 1  . pantoprazole (PROTONIX) 40 MG tablet Take 1 tablet (40 mg total) by mouth daily. For GERD 30 tablet 3  . Polyethyl Glycol-Propyl Glycol (SYSTANE OP) Place 1 drop into both eyes 2 (two) times daily.     . polyethylene glycol (MIRALAX / GLYCOLAX) packet Take 17 g by mouth every morning.     . potassium chloride (K-DUR) 10 MEQ tablet Take 10 mEq by mouth every Monday, Wednesday, and Friday.    . propafenone (RYTHMOL) 225 MG tablet Take 1 tablet (225 mg total) by mouth 2 (two) times daily. 180 tablet 3  . valsartan (DIOVAN) 320 MG tablet Take 1 tablet (320 mg total) by mouth daily. 30 tablet 3   No current facility-administered medications for this visit.    Social History   Social History  . Marital Status: Married    Spouse Name: N/A  . Number of Children: N/A  . Years of Education: N/A   Occupational History  . Not on file.   Social History Main Topics  . Smoking status: Never Smoker   . Smokeless tobacco: Never Used  . Alcohol Use: No  . Drug Use: No  . Sexual Activity: Not Currently   Other Topics Concern  . Not on file   Social History Narrative   Married mother of 3, grandmother 61.   Exercises 6/7 days a week walking 30 minutes a time.   Never smoked or drank  alcohol.    Family History  Problem Relation Age of Onset  . Hypertension Mother   . Basal cell carcinoma Mother   . AAA (abdominal  aortic aneurysm) Father   . Heart Problems Maternal Uncle   . Heart Problems Maternal Grandfather   . AAA (abdominal aortic aneurysm) Paternal Grandmother   . Prostate cancer Maternal Uncle 70  . Prostate cancer Other   . Other Daughter     one daughter had TAH-BSO at 17; other daughter will have one soon      Alvino Chapel, MD 09/12/2015, 1:53 PM

## 2015-09-12 NOTE — Patient Instructions (Signed)
Plan for three more cycles under the care of Dr. Marko Plume then a repeat scan.  Also plan to see Dr. Fermin Schwab at the completion of three more cycles.  Please call for any questions or concerns.

## 2015-09-15 ENCOUNTER — Other Ambulatory Visit: Payer: Self-pay | Admitting: Cardiology

## 2015-09-15 ENCOUNTER — Telehealth: Payer: Self-pay | Admitting: *Deleted

## 2015-09-15 NOTE — Telephone Encounter (Signed)
Call from patient asking if she may "keep her dental appointment or should she put it off".  Last chemotherapy was 08-25-2015.  Reports will have three more scheduled.  Patient's undergoing chemotherapy should not have dental work due to possibility of low blood counts.  Will notify Dr. Marko Plume but says she will cancel the 10-14-2015 dental appointment.

## 2015-09-16 ENCOUNTER — Other Ambulatory Visit: Payer: Self-pay | Admitting: Dermatology

## 2015-09-23 ENCOUNTER — Other Ambulatory Visit: Payer: Self-pay | Admitting: Cardiology

## 2015-09-23 NOTE — Telephone Encounter (Signed)
Rx request sent to pharmacy.  

## 2015-09-24 ENCOUNTER — Other Ambulatory Visit: Payer: Self-pay | Admitting: Oncology

## 2015-09-24 DIAGNOSIS — C563 Malignant neoplasm of bilateral ovaries: Secondary | ICD-10-CM

## 2015-09-24 DIAGNOSIS — C561 Malignant neoplasm of right ovary: Secondary | ICD-10-CM

## 2015-09-24 DIAGNOSIS — C562 Malignant neoplasm of left ovary: Principal | ICD-10-CM

## 2015-09-24 DIAGNOSIS — C7889 Secondary malignant neoplasm of other digestive organs: Secondary | ICD-10-CM | POA: Insufficient documentation

## 2015-09-25 ENCOUNTER — Telehealth: Payer: Self-pay | Admitting: *Deleted

## 2015-09-25 ENCOUNTER — Encounter: Payer: Self-pay | Admitting: Oncology

## 2015-09-25 ENCOUNTER — Ambulatory Visit (HOSPITAL_BASED_OUTPATIENT_CLINIC_OR_DEPARTMENT_OTHER): Payer: Medicare Other | Admitting: Oncology

## 2015-09-25 ENCOUNTER — Other Ambulatory Visit (HOSPITAL_BASED_OUTPATIENT_CLINIC_OR_DEPARTMENT_OTHER): Payer: Medicare Other

## 2015-09-25 ENCOUNTER — Telehealth: Payer: Self-pay | Admitting: Oncology

## 2015-09-25 ENCOUNTER — Other Ambulatory Visit: Payer: Self-pay

## 2015-09-25 VITALS — BP 162/75 | HR 65 | Temp 100.0°F | Resp 18 | Ht 63.0 in | Wt 136.6 lb

## 2015-09-25 DIAGNOSIS — C562 Malignant neoplasm of left ovary: Secondary | ICD-10-CM | POA: Diagnosis not present

## 2015-09-25 DIAGNOSIS — C5702 Malignant neoplasm of left fallopian tube: Secondary | ICD-10-CM

## 2015-09-25 DIAGNOSIS — C563 Malignant neoplasm of bilateral ovaries: Secondary | ICD-10-CM

## 2015-09-25 DIAGNOSIS — Z95828 Presence of other vascular implants and grafts: Secondary | ICD-10-CM

## 2015-09-25 DIAGNOSIS — C7889 Secondary malignant neoplasm of other digestive organs: Secondary | ICD-10-CM

## 2015-09-25 DIAGNOSIS — C569 Malignant neoplasm of unspecified ovary: Secondary | ICD-10-CM

## 2015-09-25 DIAGNOSIS — C561 Malignant neoplasm of right ovary: Secondary | ICD-10-CM | POA: Diagnosis not present

## 2015-09-25 DIAGNOSIS — I158 Other secondary hypertension: Secondary | ICD-10-CM

## 2015-09-25 DIAGNOSIS — K5900 Constipation, unspecified: Secondary | ICD-10-CM

## 2015-09-25 DIAGNOSIS — G620 Drug-induced polyneuropathy: Secondary | ICD-10-CM

## 2015-09-25 DIAGNOSIS — C5701 Malignant neoplasm of right fallopian tube: Secondary | ICD-10-CM

## 2015-09-25 DIAGNOSIS — T451X5A Adverse effect of antineoplastic and immunosuppressive drugs, initial encounter: Secondary | ICD-10-CM

## 2015-09-25 DIAGNOSIS — D6959 Other secondary thrombocytopenia: Secondary | ICD-10-CM

## 2015-09-25 DIAGNOSIS — C7989 Secondary malignant neoplasm of other specified sites: Secondary | ICD-10-CM

## 2015-09-25 DIAGNOSIS — D701 Agranulocytosis secondary to cancer chemotherapy: Secondary | ICD-10-CM

## 2015-09-25 DIAGNOSIS — I10 Essential (primary) hypertension: Secondary | ICD-10-CM

## 2015-09-25 DIAGNOSIS — K219 Gastro-esophageal reflux disease without esophagitis: Secondary | ICD-10-CM

## 2015-09-25 DIAGNOSIS — T50905A Adverse effect of unspecified drugs, medicaments and biological substances, initial encounter: Secondary | ICD-10-CM

## 2015-09-25 LAB — CBC WITH DIFFERENTIAL/PLATELET
BASO%: 0.5 % (ref 0.0–2.0)
Basophils Absolute: 0 10*3/uL (ref 0.0–0.1)
EOS ABS: 0 10*3/uL (ref 0.0–0.5)
EOS%: 0.8 % (ref 0.0–7.0)
HCT: 40.6 % (ref 34.8–46.6)
HEMOGLOBIN: 13.8 g/dL (ref 11.6–15.9)
LYMPH%: 39.7 % (ref 14.0–49.7)
MCH: 34.3 pg — AB (ref 25.1–34.0)
MCHC: 34 g/dL (ref 31.5–36.0)
MCV: 101 fL (ref 79.5–101.0)
MONO#: 0.5 10*3/uL (ref 0.1–0.9)
MONO%: 12.7 % (ref 0.0–14.0)
NEUT%: 46.3 % (ref 38.4–76.8)
NEUTROS ABS: 1.8 10*3/uL (ref 1.5–6.5)
Platelets: 111 10*3/uL — ABNORMAL LOW (ref 145–400)
RBC: 4.02 10*6/uL (ref 3.70–5.45)
RDW: 14.3 % (ref 11.2–14.5)
WBC: 4 10*3/uL (ref 3.9–10.3)
lymph#: 1.6 10*3/uL (ref 0.9–3.3)

## 2015-09-25 LAB — COMPREHENSIVE METABOLIC PANEL (CC13)
ALBUMIN: 4.2 g/dL (ref 3.5–5.0)
ALT: 13 U/L (ref 0–55)
AST: 17 U/L (ref 5–34)
Alkaline Phosphatase: 69 U/L (ref 40–150)
Anion Gap: 8 mEq/L (ref 3–11)
BUN: 10.2 mg/dL (ref 7.0–26.0)
CO2: 29 meq/L (ref 22–29)
Calcium: 9.8 mg/dL (ref 8.4–10.4)
Chloride: 105 mEq/L (ref 98–109)
Creatinine: 0.7 mg/dL (ref 0.6–1.1)
EGFR: 87 mL/min/{1.73_m2} — AB (ref >90)
GLUCOSE: 89 mg/dL (ref 70–140)
POTASSIUM: 3.5 meq/L (ref 3.5–5.1)
SODIUM: 142 meq/L (ref 136–145)
Total Bilirubin: 0.59 mg/dL (ref 0.20–1.20)
Total Protein: 7 g/dL (ref 6.4–8.3)

## 2015-09-25 MED ORDER — DEXAMETHASONE 4 MG PO TABS: ORAL_TABLET | ORAL | Status: DC

## 2015-09-25 NOTE — Telephone Encounter (Signed)
Per staff phone call and POF I have schedueld appts. Scheduler advised of appts.  JMW  

## 2015-09-25 NOTE — Progress Notes (Signed)
OFFICE PROGRESS NOTE   September 25, 2015   Physicians:D. 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 CT AP 09-10-15 shows resolution of involvement other than in spleen, which is improved. She saw Dr Josephina Shih on 09-12-15 , with discussion of splenectomy followed by avastin vs 3 additional cycles of chemo + avastin then reassess. Patient prefers additional treatment to splenectomy now.  She tolerated chemo with granix 300 mg x4 doses beginning day after chemo, and oral decadron x 4 days after chemo. She did not need additional IVF for last treatment.  She has felt well during break off of treatment, other than flare of chronic back pain. Energy and appetite are good, bowels moving regularly with ongoing laxatives, no abdominal or pelvic discomfort. Back pain now is localized to right SI joint, has had injections there previously by Dr Ninfa Linden; no injury, no radiation of pain to leg, no spine pain.  No problems with PAC.    Flu vaccine 09-12-15 PAC placed by IR 04-21-15 Genetics testing BreastOvarian panel by [facility] sent 06-26-15: normal CA 125 on 05-01-15 33  ONCOLOGIC HISTORY Patient presented to Dr Abbie Sons in late Jan 2014 after bright red spotting that AM following 2 weeks of spotting in early Dec 2015. with uterus larger than had been apparent on exam in Oct 2013. Sonohystogram 01-05-13 showed uterus normal size and echotexture, endometrium 4.3 mm, left ovary normal and right adnexa with 1.1 x 8.4.x8.2 cm cystic and solid mass. Endometrial biopsy benign and CA 125 also on 01-05-13 was 178.8. She had CT AP 01-17-13 with 1.0 x 6.9 x 8.9 cm complex right ovarian mass, no ascites, small  retroperitoneal nodes. She was seen by Dr Skeet Latch on 01-18-13 and taken to surgery by Dr Josephina Shih on 02-13-13, which was TAH/BSO/ omentectomy/ureterolysis/ resection of cul-de-sac tumor/right pelvic lymphadnectomy and resection of rectum with reanastomosis. At completion of surgery there was no gross residual disease. Pathology [phone]) had high grade poorly differentiated carcinoma consistent with high grade transitional cell and high grade serous carcinoma involving bilateral ovaries and fallopian tubes as well as excised tissue from cul-de-sac and perirectal tissue, with 7 nodes negative and omentum negative. [place] [phone]) had rare clusters of atypical cells. Chemotherapy with dose dense taxol carboplatin was begun day 1 cycle 1 on 03-20-13; ANC was 1.1 on day 15 cycle 1 with taxol given and neupogen added 04-04-13. She had day 1 cycle 2 treatment on 04-17-13, then was briefly hospitalized 5-22 to 04-20-13 after syncopal episode, with antihypertensive agents DCd and UTI treated. She was feeling much better at time of "day 8" cycle 2 on 05-01-13 and did have neupogen 300 mcg x 1 dose on 05-02-13. She was readmitted to hospital 6-5 thru 05-05-13 with fever, empirically on antibiotics and blood cultures negative. Cycle 6 completed 08-14-13. CT AP 09-25-13 had no evidence of cancer and CA 125 was 18.3 on 07-24-2013. Marker was 20 in early Dec 2015, 26 on 01-31-15 and 35 on 03-14-15. PET 04-03-15 documented disease bilateral pelvic sidewalls, vaginal cuff and scattered areas thru abdomen/ pelvis, as well as area of uptake in spleen. She resumed carboplatin taxol using q 3 week regimen on 05-01-15. She was neutropenic with ANC 0.3 on day 9 despite beginning granix on day 5.  Avastin added with cycle 2 chemo on 05-26-15. CT AP 07-12-15 with improvement in splenic lesion and pelvic adenopathy, still area at posterior stomach, no new involvement; CA 125 down to 14 prior to cycle 4    Review of systems as above, also: No fever  or symptoms of infection. No bleeding. No LE swelling. No chest pain or SOB. Peripheral neuropathy feet >hands fairly minimal Remainder of 10 point Review of Systems negative.  Objective:  Vital signs in last 24 hours:  BP 162/75 mmHg  Pulse 65  Temp(Src) 100 F (37.8 C) (Oral)  Resp 18  Ht 5\' 3"  (1.6 m)  Wt 136 lb 9.6 oz (61.961 kg)  BMI 24.20 kg/m2 Weight up 3 lbs Alert, oriented and appropriate. Ambulatory with difficulty due to right SI pain  Alopecia  HEENT:PERRL, sclerae not icteric. Oral mucosa moist without lesions, posterior pharynx clear.  Neck supple. No JVD.  Lymphatics:no cervical,supraclavicular, axillary or inguinal adenopathy Resp: clear to auscultation bilaterally and normal percussion bilaterally Cardio: regular rate and rhythm. No gallop. GI: soft, nontender, not distended, no mass or organomegaly. Normally active bowel sounds. Surgical incision not remarkable. Musculoskeletal/ Extremities: without pitting edema, cords, tenderness. Point tender reproducing pain right upper SI Neuro: peripheral neuropathy a little better hands and feet. Otherwise nonfocal. PSYCH appropriate mood and affect. Skin without rash, ecchymosis, petechiae. Portacath-without erythema or tenderness  Lab Results:  Results for orders placed or performed in visit on 09/25/15  CBC with Differential  Result Value Ref Range   WBC 4.0 3.9 - 10.3 10e3/uL   NEUT# 1.8 1.5 - 6.5 10e3/uL   HGB 13.8 11.6 - 15.9 g/dL   HCT 40.6 34.8 - 46.6 %   Platelets 111 (L) 145 - 400 10e3/uL   MCV 101.0 79.5 - 101.0 fL   MCH 34.3 (H) 25.1 - 34.0 pg   MCHC 34.0 31.5 - 36.0 g/dL   RBC 4.02 3.70 - 5.45 10e6/uL   RDW 14.3 11.2 - 14.5 %   lymph# 1.6 0.9 - 3.3 10e3/uL   MONO# 0.5 0.1 - 0.9 10e3/uL   Eosinophils Absolute 0.0 0.0 - 0.5 10e3/uL   Basophils Absolute 0.0 0.0 - 0.1 10e3/uL   NEUT% 46.3 38.4 - 76.8 %   LYMPH% 39.7 14.0 - 49.7 %   MONO% 12.7 0.0 - 14.0 %   EOS% 0.8 0.0 - 7.0 %   BASO% 0.5 0.0 -  2.0 %   Denies any recent viral illness, note platelets 111k now.  Studies/Results: EXAM: CT ABDOMEN AND PELVIS WITH CONTRAST  TECHNIQUE: Multidetector CT imaging of the abdomen and pelvis was performed using the standard protocol following bolus administration of intravenous contrast.  CONTRAST: 1102mL OMNIPAQUE IOHEXOL 300 MG/ML SOLN  COMPARISON: 07/02/2015  FINDINGS: Lower chest: Clear lung bases. Mild cardiomegaly. A small hiatal hernia.  Hepatobiliary: Normal liver. Normal gallbladder, without biliary ductal dilatation.  Pancreas: Normal, without mass or ductal dilatation.  Spleen: Capsular based medial splenic lesion measures 2.3 x 1.0 cm on image/series 16/2. Slightly decreased from 3.0 by 1.4 cm on the prior exam.  Inferior splenic low-density lesion measures 9 mm on image/series 26/2. 8 mm on the prior exam.  Adrenals/Urinary Tract: Normal adrenal glands. Too small to characterize interpolar right renal lesion. Normal left kidney, without hydronephrosis. Degraded evaluation of the pelvis, secondary to beam hardening artifact from right hip arthroplasty. Urinary bladder grossly normal.  Stomach/Bowel: The proximal and mid stomach underdistended. The previously described posterior gastric possible mass is not identified (and may have  represented food contents on the prior exam). Colonic stool burden suggests constipation. Normal terminal ileum. Normal small bowel.  Vascular/Lymphatic: Aortic and branch vessel atherosclerosis. No retroperitoneal or retrocrural adenopathy. Small pelvic nodes are again identified and not pathologic by size criteria. Example 5 mm right external iliac node on image/series 58/2  Reproductive: Hysterectomy. No adnexal mass.  Other: No significant free fluid. No evidence of omental or peritoneal disease. Pelvic floor laxity.  Musculoskeletal: Right hip arthroplasty. S-shaped thoracolumbar spine curvature.  Lumbosacral spondylosis  IMPRESSION: 1. Response to therapy, with further decreased size of a medial splenic lesion. The previously questioned extension into the stomach is no longer identified and may have been artifactual on the prior exam. Of note, an inferior splenic low-density capsular based lesion is felt to be similar and warrants followup attention. 2. No adenopathy or new peritoneal disease identified. 3. Possible constipation. 4. Advanced aortic and branch vessel atherosclerosis.  PACs images reviewed with patient and husband now.  Medications: I have reviewed the patient's current medications.  DISCUSSION: Findings on CT reviewed now, including images on PACs. We have discussed rationale for splenectomy vs rationale for continuing systemic chemotherapy. Particularly as disease was not limited to spleen at recurrence, and as she does not want splenectomy, she is in agreement with continuing carbo taxol avastin, with support as previously   Assessment/Plan:  1.Recurrent ovarian carcinoma: IIB poorly differentiated serous involving bilateral ovaries and tubes at optimal radical debulking 02-13-13, completed adjuvant dose dense carboplatin taxol 08-14-2013. Gradual increase in CA125 March and April 2016 with PET evidence of progressive disease pelvis and abdomen, asymptomatic. Resumed chemotherapy with carboplatin and taxol using q 3 week dosing, cycle 1 on 05-01-15, addition of avastin with cycle 2, thru cycle 6 on 08-25-15. Restaging CT shows improvement, with residual in spleen. Plan additional 3 cycles carbo taxol avastin beginning next week, with skin testing prior to each [place] and  Support with granix and steroids. Note platelet count 111k and ANC 1.8, carbo dosing adjusted.  2.PAC in 3.HTN: follow with Avastin, recent  med changes by cardiology 4.paroxysmal Afib: followed by Dr Ellyn Hack Note on plavix and ASA if platelets drop with chemo.  5.chronic constipation requiring  increased laxatives with chemo, up to date on colonoscopy.  6.hypothyroid on replacement by PCP 7. Left knee replacement 12-2014, doing well. Right hip replacement 2013 for osteoarthritis 8. Long estrogen replacement, which she still uses. She has previously refused to discontinue the estrogen. Initial surgical path from 02-13-13 does not report ER PR testing. 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 Breast tissue heterogeneously dense by last tomo mammograms at [facility] 03-21-15, those mammograms otherwise negative.  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 GERD: better with protonix, continue 12. flu vaccine 09-12-15   All questions answered and patient is in agreement with plans. Chemo, avastin, granix orders placed. Managed care notified. Time spent 30 min including >50% counseling and coordination of care. Cc Dr Esaw Dace, MD   09/25/2015, 9:37 AM

## 2015-09-25 NOTE — Telephone Encounter (Signed)
-----   Message from Gordy Levan, MD sent at 09/25/2015 10:14 AM EDT ----- Script for decadron 4 mg  5 tabs with food 12 hrs and 6 hrs prior to taxol, then 1 tab with food QAM next 4 days  # 14 with 2 RF  thanks

## 2015-09-25 NOTE — Telephone Encounter (Signed)
Appointments made and

## 2015-09-25 NOTE — Telephone Encounter (Signed)
E-scribed decadron per LL's message.

## 2015-09-26 ENCOUNTER — Other Ambulatory Visit: Payer: Self-pay | Admitting: Oncology

## 2015-09-26 LAB — CA 125: CA 125: 14 U/mL (ref <35)

## 2015-09-29 ENCOUNTER — Ambulatory Visit (HOSPITAL_BASED_OUTPATIENT_CLINIC_OR_DEPARTMENT_OTHER): Payer: Medicare Other

## 2015-09-29 ENCOUNTER — Other Ambulatory Visit (HOSPITAL_BASED_OUTPATIENT_CLINIC_OR_DEPARTMENT_OTHER): Payer: Medicare Other

## 2015-09-29 ENCOUNTER — Telehealth: Payer: Self-pay | Admitting: Cardiology

## 2015-09-29 VITALS — BP 136/60 | HR 65 | Temp 98.4°F | Resp 18

## 2015-09-29 DIAGNOSIS — Z5111 Encounter for antineoplastic chemotherapy: Secondary | ICD-10-CM | POA: Diagnosis not present

## 2015-09-29 DIAGNOSIS — C569 Malignant neoplasm of unspecified ovary: Secondary | ICD-10-CM

## 2015-09-29 DIAGNOSIS — Z5112 Encounter for antineoplastic immunotherapy: Secondary | ICD-10-CM | POA: Diagnosis not present

## 2015-09-29 DIAGNOSIS — C562 Malignant neoplasm of left ovary: Secondary | ICD-10-CM | POA: Diagnosis not present

## 2015-09-29 DIAGNOSIS — C5701 Malignant neoplasm of right fallopian tube: Secondary | ICD-10-CM

## 2015-09-29 DIAGNOSIS — C561 Malignant neoplasm of right ovary: Secondary | ICD-10-CM | POA: Diagnosis not present

## 2015-09-29 DIAGNOSIS — C5702 Malignant neoplasm of left fallopian tube: Secondary | ICD-10-CM

## 2015-09-29 LAB — COMPREHENSIVE METABOLIC PANEL (CC13)
ALBUMIN: 4.1 g/dL (ref 3.5–5.0)
ALT: 15 U/L (ref 0–55)
ANION GAP: 7 meq/L (ref 3–11)
AST: 16 U/L (ref 5–34)
Alkaline Phosphatase: 71 U/L (ref 40–150)
BILIRUBIN TOTAL: 0.53 mg/dL (ref 0.20–1.20)
BUN: 11.8 mg/dL (ref 7.0–26.0)
CALCIUM: 9.9 mg/dL (ref 8.4–10.4)
CO2: 29 mEq/L (ref 22–29)
CREATININE: 0.7 mg/dL (ref 0.6–1.1)
Chloride: 103 mEq/L (ref 98–109)
EGFR: 87 mL/min/{1.73_m2} — AB (ref >90)
GLUCOSE: 148 mg/dL — AB (ref 70–140)
POTASSIUM: 4.1 meq/L (ref 3.5–5.1)
SODIUM: 139 meq/L (ref 136–145)
TOTAL PROTEIN: 6.9 g/dL (ref 6.4–8.3)

## 2015-09-29 LAB — CBC WITH DIFFERENTIAL/PLATELET
BASO%: 0.5 % (ref 0.0–2.0)
BASOS ABS: 0 10*3/uL (ref 0.0–0.1)
EOS ABS: 0 10*3/uL (ref 0.0–0.5)
EOS%: 0 % (ref 0.0–7.0)
HEMATOCRIT: 39.3 % (ref 34.8–46.6)
HEMOGLOBIN: 13.3 g/dL (ref 11.6–15.9)
LYMPH%: 8 % — ABNORMAL LOW (ref 14.0–49.7)
MCH: 33.9 pg (ref 25.1–34.0)
MCHC: 33.7 g/dL (ref 31.5–36.0)
MCV: 100.6 fL (ref 79.5–101.0)
MONO#: 0 10*3/uL — AB (ref 0.1–0.9)
MONO%: 0.7 % (ref 0.0–14.0)
NEUT%: 90.8 % — ABNORMAL HIGH (ref 38.4–76.8)
NEUTROS ABS: 5.6 10*3/uL (ref 1.5–6.5)
PLATELETS: 137 10*3/uL — AB (ref 145–400)
RBC: 3.91 10*6/uL (ref 3.70–5.45)
RDW: 15.2 % — AB (ref 11.2–14.5)
WBC: 6.1 10*3/uL (ref 3.9–10.3)
lymph#: 0.5 10*3/uL — ABNORMAL LOW (ref 0.9–3.3)

## 2015-09-29 LAB — UA PROTEIN, DIPSTICK - CHCC: Protein, ur: 30 mg/dL

## 2015-09-29 MED ORDER — SODIUM CHLORIDE 0.9 % IV SOLN
219.6000 mg | Freq: Once | INTRAVENOUS | Status: AC
  Administered 2015-09-29: 220 mg via INTRAVENOUS
  Filled 2015-09-29: qty 22

## 2015-09-29 MED ORDER — SODIUM CHLORIDE 0.9 % IV SOLN
Freq: Once | INTRAVENOUS | Status: AC
  Administered 2015-09-29: 13:00:00 via INTRAVENOUS

## 2015-09-29 MED ORDER — DEXAMETHASONE SODIUM PHOSPHATE 100 MG/10ML IJ SOLN
Freq: Once | INTRAMUSCULAR | Status: AC
  Administered 2015-09-29: 15:00:00 via INTRAVENOUS
  Filled 2015-09-29: qty 8

## 2015-09-29 MED ORDER — FAMOTIDINE IN NACL 20-0.9 MG/50ML-% IV SOLN
INTRAVENOUS | Status: AC
  Filled 2015-09-29: qty 50

## 2015-09-29 MED ORDER — DIPHENHYDRAMINE HCL 50 MG/ML IJ SOLN
25.0000 mg | Freq: Once | INTRAMUSCULAR | Status: AC
  Administered 2015-09-29: 25 mg via INTRAVENOUS

## 2015-09-29 MED ORDER — SODIUM CHLORIDE 0.9 % IV SOLN
15.0000 mg/kg | Freq: Once | INTRAVENOUS | Status: AC
  Administered 2015-09-29: 900 mg via INTRAVENOUS
  Filled 2015-09-29: qty 36

## 2015-09-29 MED ORDER — CARBOPLATIN CHEMO INTRADERMAL TEST DOSE 100MCG/0.02ML
100.0000 ug | Freq: Once | INTRADERMAL | Status: AC
  Administered 2015-09-29: 100 ug via INTRADERMAL
  Filled 2015-09-29: qty 0.01

## 2015-09-29 MED ORDER — SODIUM CHLORIDE 0.9 % IJ SOLN
10.0000 mL | INTRAMUSCULAR | Status: DC | PRN
  Administered 2015-09-29: 10 mL
  Filled 2015-09-29: qty 10

## 2015-09-29 MED ORDER — HEPARIN SOD (PORK) LOCK FLUSH 100 UNIT/ML IV SOLN
500.0000 [IU] | Freq: Once | INTRAVENOUS | Status: AC | PRN
  Administered 2015-09-29: 500 [IU]
  Filled 2015-09-29: qty 5

## 2015-09-29 MED ORDER — PACLITAXEL CHEMO INJECTION 300 MG/50ML
135.0000 mg/m2 | Freq: Once | INTRAVENOUS | Status: AC
  Administered 2015-09-29: 222 mg via INTRAVENOUS
  Filled 2015-09-29: qty 37

## 2015-09-29 MED ORDER — FAMOTIDINE IN NACL 20-0.9 MG/50ML-% IV SOLN
20.0000 mg | Freq: Once | INTRAVENOUS | Status: AC
  Administered 2015-09-29: 20 mg via INTRAVENOUS

## 2015-09-29 MED ORDER — DIPHENHYDRAMINE HCL 50 MG/ML IJ SOLN
INTRAMUSCULAR | Status: AC
Start: 2015-09-29 — End: 2015-09-29
  Filled 2015-09-29: qty 1

## 2015-09-29 NOTE — Progress Notes (Signed)
In test dose- no reaction. Chemo ordered.

## 2015-09-29 NOTE — Patient Instructions (Addendum)
[facility] Discharge Instructions for Patients Receiving Chemotherapy  Today you received the following chemotherapy agents Carboplatin, Taxol, Avastin  To help prevent nausea and vomiting after your treatment, we encourage you to take your nausea medication as directed If you develop nausea and vomiting that is not controlled by your nausea medication, call the clinic.   BELOW ARE SYMPTOMS THAT SHOULD BE REPORTED IMMEDIATELY:  *FEVER GREATER THAN 100.5 F  *CHILLS WITH OR WITHOUT FEVER  NAUSEA AND VOMITING THAT IS NOT CONTROLLED WITH YOUR NAUSEA MEDICATION  *UNUSUAL SHORTNESS OF BREATH  *UNUSUAL BRUISING OR BLEEDING  TENDERNESS IN MOUTH AND THROAT WITH OR WITHOUT PRESENCE OF ULCERS  *URINARY PROBLEMS  *BOWEL PROBLEMS  UNUSUAL RASH Items with * indicate a potential emergency and should be followed up as soon as possible.  Feel free to call the clinic you have any questions or concerns. The clinic phone number is [phone].  Please show the [facility] at check-in to the Emergency Department and triage nurse.  Bevacizumab injection What is this medicine? BEVACIZUMAB (be va SIZ yoo mab) is a monoclonal antibody. It is used to treat cervical cancer, colorectal cancer, glioblastoma multiforme, non-small cell lung cancer (NSCLC), ovarian cancer, and renal cell cancer. This medicine may be used for other purposes; ask your health care provider or pharmacist if you have questions. What should I tell my health care provider before I take this medicine? They need to know if you have any of these conditions: -blood clots -heart disease, including heart failure, heart attack, or chest pain (angina) -high blood pressure -infection (especially a virus infection such as chickenpox, cold sores, or herpes) -kidney disease -lung disease -prior chemotherapy with doxorubicin, daunorubicin, epirubicin, or other anthracycline type chemotherapy agents -recent or  ongoing radiation therapy -recent surgery -stroke -an unusual or allergic reaction to bevacizumab, hamster proteins, mouse proteins, other medicines, foods, dyes, or preservatives -pregnant or trying to get pregnant -breast-feeding How should I use this medicine? This medicine is for infusion into a vein. It is given by a health care professional in a hospital or clinic setting. Talk to your pediatrician regarding the use of this medicine in children. Special care may be needed. Overdosage: If you think you have taken too much of this medicine contact a poison control center or emergency room at once. NOTE: This medicine is only for you. Do not share this medicine with others. What if I miss a dose? It is important not to miss your dose. Call your doctor or health care professional if you are unable to keep an appointment. What may interact with this medicine? Interactions are not expected. This list may not describe all possible interactions. Give your health care provider a list of all the medicines, herbs, non-prescription drugs, or dietary supplements you use. Also tell them if you smoke, drink alcohol, or use illegal drugs. Some items may interact with your medicine. What should I watch for while using this medicine? Your condition will be monitored carefully while you are receiving this medicine. You will need important blood work and urine testing done while you are taking this medicine. During your treatment, let your health care professional know if you have any unusual symptoms, such as difficulty breathing. This medicine may rarely cause 'gastrointestinal perforation' (holes in the stomach, intestines or colon), a serious side effect requiring surgery to repair. This medicine should be started at least 28 days following major surgery and the site of the surgery should be totally healed.  Check with your doctor before scheduling dental work or surgery while you are receiving this  treatment. Talk to your doctor if you have recently had surgery or if you have a wound that has not healed. Do not become pregnant while taking this medicine or for 6 months after stopping it. Women should inform their doctor if they wish to become pregnant or think they might be pregnant. There is a potential for serious side effects to an unborn child. Talk to your health care professional or pharmacist for more information. Do not breast-feed an infant while taking this medicine. This medicine has caused ovarian failure in some women. This medicine may interfere with the ability to have a child. You should talk to your doctor or health care professional if you are concerned about your fertility. What side effects may I notice from receiving this medicine? Side effects that you should report to your doctor or health care professional as soon as possible: -allergic reactions like skin rash, itching or hives, swelling of the face, lips, or tongue -signs of infection - fever or chills, cough, sore throat, pain or trouble passing urine -signs of decreased platelets or bleeding - bruising, pinpoint red spots on the skin, black, tarry stools, nosebleeds, blood in the urine -breathing problems -changes in vision -chest pain -confusion -jaw pain, especially after dental work -mouth sores -seizures -severe abdominal pain -severe headache -sudden numbness or weakness of the face, arm or leg -swelling of legs or ankles -symptoms of a stroke: change in mental awareness, inability to talk or move one side of the body (especially in patients with lung cancer) -trouble passing urine or change in the amount of urine -trouble speaking or understanding -trouble walking, dizziness, loss of balance or coordination Side effects that usually do not require medical attention (report to your doctor or health care professional if they continue or are bothersome): -constipation -diarrhea -dry  skin -headache -loss of appetite -nausea, vomiting This list may not describe all possible side effects. Call your doctor for medical advice about side effects. You may report side effects to FDA at 1-800-FDA-1088. Where should I keep my medicine? This drug is given in a hospital or clinic and will not be stored at home. NOTE: This sheet is a summary. It may not cover all possible information. If you have questions about this medicine, talk to your doctor, pharmacist, or health care provider.    2016, [employer]vier/[employer]. (2015-01-14 16:58:44)

## 2015-09-29 NOTE — Telephone Encounter (Signed)
Spoke with patient who has complaints of ankle swelling and pain (due to medications). She reports she swelling is also on the top of her feet. She reports she has indentions from her socks and shoes on her feet. She states her cancer medication Avastin causes her BP to go up and she will be on this medication for 1 year. Her BP 10/27 was 162/75 @ cancer doctor and she reports her BP is running around the 140s at home but she has not checked her BP in weeks.   9/8 - losartan stopped, valsartan started, amlodipine increased to 10mg  8/24 - amlodipine 5mg  started  Informed patient I will route the message to [facility] & Dr. Ellyn Hack to advise on her side effects of the medications she is on. She is aware that both are out of the office. She states a message with info can be left on her home VM.

## 2015-09-29 NOTE — Telephone Encounter (Signed)
[name] is calling because the blood pressure medication she is on (Valsartan 320mg  ) and Amolodpine 10mg  in the afternoon. She is saying that it is causing her ankles to swell and  hurt very bad. Please call    Thanks

## 2015-09-30 ENCOUNTER — Telehealth: Payer: Self-pay | Admitting: Cardiology

## 2015-09-30 ENCOUNTER — Ambulatory Visit (HOSPITAL_BASED_OUTPATIENT_CLINIC_OR_DEPARTMENT_OTHER): Payer: Medicare Other

## 2015-09-30 VITALS — BP 144/73 | HR 68 | Temp 98.3°F

## 2015-09-30 DIAGNOSIS — C5702 Malignant neoplasm of left fallopian tube: Secondary | ICD-10-CM | POA: Diagnosis not present

## 2015-09-30 DIAGNOSIS — C561 Malignant neoplasm of right ovary: Secondary | ICD-10-CM

## 2015-09-30 DIAGNOSIS — C5701 Malignant neoplasm of right fallopian tube: Secondary | ICD-10-CM | POA: Diagnosis not present

## 2015-09-30 DIAGNOSIS — C569 Malignant neoplasm of unspecified ovary: Secondary | ICD-10-CM

## 2015-09-30 DIAGNOSIS — C562 Malignant neoplasm of left ovary: Secondary | ICD-10-CM

## 2015-09-30 DIAGNOSIS — Z5189 Encounter for other specified aftercare: Secondary | ICD-10-CM

## 2015-09-30 DIAGNOSIS — R971 Elevated cancer antigen 125 [CA 125]: Secondary | ICD-10-CM

## 2015-09-30 MED ORDER — HYDRALAZINE HCL 25 MG PO TABS: 25.0000 mg | ORAL_TABLET | Freq: Two times a day (BID) | ORAL | Status: DC

## 2015-09-30 MED ORDER — TBO-FILGRASTIM 300 MCG/0.5ML ~~LOC~~ SOSY
300.0000 ug | PREFILLED_SYRINGE | Freq: Once | SUBCUTANEOUS | Status: AC
  Administered 2015-09-30: 300 ug via SUBCUTANEOUS
  Filled 2015-09-30: qty 0.5

## 2015-09-30 NOTE — Telephone Encounter (Signed)
[name] at [facility] given new prescription for metoprolol tartrate 25mg  #120, 2 of a 25mg  tablet two times a day, 3 refills.

## 2015-09-30 NOTE — Telephone Encounter (Signed)
Pt swelling probably due to amlodipine.  Thought she could put up with the swelling, but was just told she will be on Avastin for 1 year.  Will stop amlodipine and switch to hydralazine 25 mg bid.  She is to watch her BP and contact me in 7-10 days to let me know how she'd doing.  We may need to go up to 50 mg.

## 2015-09-30 NOTE — Telephone Encounter (Signed)
The Metoprolol is for 1 1/2 twice a day and  [name] is saying its supposed to be 2 twice a day , needs clarification on that . Please call   Thanks

## 2015-10-01 ENCOUNTER — Ambulatory Visit (HOSPITAL_BASED_OUTPATIENT_CLINIC_OR_DEPARTMENT_OTHER): Payer: Medicare Other

## 2015-10-01 VITALS — BP 136/67 | HR 64 | Temp 97.5°F | Resp 18

## 2015-10-01 DIAGNOSIS — C562 Malignant neoplasm of left ovary: Secondary | ICD-10-CM | POA: Diagnosis not present

## 2015-10-01 DIAGNOSIS — C569 Malignant neoplasm of unspecified ovary: Secondary | ICD-10-CM

## 2015-10-01 DIAGNOSIS — C561 Malignant neoplasm of right ovary: Secondary | ICD-10-CM

## 2015-10-01 DIAGNOSIS — Z5189 Encounter for other specified aftercare: Secondary | ICD-10-CM

## 2015-10-01 DIAGNOSIS — C5702 Malignant neoplasm of left fallopian tube: Secondary | ICD-10-CM | POA: Diagnosis not present

## 2015-10-01 DIAGNOSIS — C5701 Malignant neoplasm of right fallopian tube: Secondary | ICD-10-CM | POA: Diagnosis not present

## 2015-10-01 DIAGNOSIS — R971 Elevated cancer antigen 125 [CA 125]: Secondary | ICD-10-CM

## 2015-10-01 MED ORDER — TBO-FILGRASTIM 300 MCG/0.5ML ~~LOC~~ SOSY
300.0000 ug | PREFILLED_SYRINGE | Freq: Once | SUBCUTANEOUS | Status: AC
  Administered 2015-10-01: 300 ug via SUBCUTANEOUS
  Filled 2015-10-01: qty 0.5

## 2015-10-01 NOTE — Telephone Encounter (Signed)
Spoke with patient Patient states she spoke to [employer] - earlier today. She verbalized understanding the change in medication

## 2015-10-01 NOTE — Telephone Encounter (Signed)
Agree with Ladora Daniel, Leonie Green, MD

## 2015-10-01 NOTE — Patient Instructions (Signed)

## 2015-10-02 ENCOUNTER — Ambulatory Visit (HOSPITAL_BASED_OUTPATIENT_CLINIC_OR_DEPARTMENT_OTHER): Payer: Medicare Other

## 2015-10-02 VITALS — BP 152/73 | HR 72 | Temp 98.6°F

## 2015-10-02 DIAGNOSIS — C569 Malignant neoplasm of unspecified ovary: Secondary | ICD-10-CM

## 2015-10-02 DIAGNOSIS — C561 Malignant neoplasm of right ovary: Secondary | ICD-10-CM

## 2015-10-02 DIAGNOSIS — R971 Elevated cancer antigen 125 [CA 125]: Secondary | ICD-10-CM

## 2015-10-02 DIAGNOSIS — Z5189 Encounter for other specified aftercare: Secondary | ICD-10-CM

## 2015-10-02 DIAGNOSIS — C562 Malignant neoplasm of left ovary: Secondary | ICD-10-CM | POA: Diagnosis not present

## 2015-10-02 MED ORDER — TBO-FILGRASTIM 300 MCG/0.5ML ~~LOC~~ SOSY
300.0000 ug | PREFILLED_SYRINGE | Freq: Once | SUBCUTANEOUS | Status: AC
  Administered 2015-10-02: 300 ug via SUBCUTANEOUS
  Filled 2015-10-02: qty 0.5

## 2015-10-03 ENCOUNTER — Ambulatory Visit (HOSPITAL_BASED_OUTPATIENT_CLINIC_OR_DEPARTMENT_OTHER): Payer: Medicare Other

## 2015-10-03 VITALS — BP 148/73 | HR 70 | Temp 98.3°F

## 2015-10-03 DIAGNOSIS — Z5189 Encounter for other specified aftercare: Secondary | ICD-10-CM

## 2015-10-03 DIAGNOSIS — C561 Malignant neoplasm of right ovary: Secondary | ICD-10-CM

## 2015-10-03 DIAGNOSIS — C569 Malignant neoplasm of unspecified ovary: Secondary | ICD-10-CM

## 2015-10-03 DIAGNOSIS — R971 Elevated cancer antigen 125 [CA 125]: Secondary | ICD-10-CM

## 2015-10-03 DIAGNOSIS — C562 Malignant neoplasm of left ovary: Secondary | ICD-10-CM | POA: Diagnosis not present

## 2015-10-03 MED ORDER — TBO-FILGRASTIM 300 MCG/0.5ML ~~LOC~~ SOSY
300.0000 ug | PREFILLED_SYRINGE | Freq: Once | SUBCUTANEOUS | Status: AC
  Administered 2015-10-03: 300 ug via SUBCUTANEOUS
  Filled 2015-10-03: qty 0.5

## 2015-10-06 ENCOUNTER — Telehealth: Payer: Self-pay | Admitting: Oncology

## 2015-10-06 ENCOUNTER — Ambulatory Visit (HOSPITAL_BASED_OUTPATIENT_CLINIC_OR_DEPARTMENT_OTHER): Payer: Medicare Other | Admitting: Oncology

## 2015-10-06 ENCOUNTER — Encounter: Payer: Self-pay | Admitting: Oncology

## 2015-10-06 ENCOUNTER — Ambulatory Visit: Payer: Medicare Other

## 2015-10-06 ENCOUNTER — Other Ambulatory Visit (HOSPITAL_BASED_OUTPATIENT_CLINIC_OR_DEPARTMENT_OTHER): Payer: Medicare Other

## 2015-10-06 VITALS — BP 167/78 | HR 64 | Temp 97.7°F | Resp 18 | Ht 63.0 in | Wt 137.4 lb

## 2015-10-06 DIAGNOSIS — Z5189 Encounter for other specified aftercare: Secondary | ICD-10-CM

## 2015-10-06 DIAGNOSIS — C562 Malignant neoplasm of left ovary: Secondary | ICD-10-CM | POA: Diagnosis not present

## 2015-10-06 DIAGNOSIS — T451X5A Adverse effect of antineoplastic and immunosuppressive drugs, initial encounter: Secondary | ICD-10-CM

## 2015-10-06 DIAGNOSIS — C561 Malignant neoplasm of right ovary: Secondary | ICD-10-CM

## 2015-10-06 DIAGNOSIS — R971 Elevated cancer antigen 125 [CA 125]: Secondary | ICD-10-CM

## 2015-10-06 DIAGNOSIS — C569 Malignant neoplasm of unspecified ovary: Secondary | ICD-10-CM

## 2015-10-06 DIAGNOSIS — D6959 Other secondary thrombocytopenia: Secondary | ICD-10-CM

## 2015-10-06 DIAGNOSIS — D701 Agranulocytosis secondary to cancer chemotherapy: Secondary | ICD-10-CM

## 2015-10-06 LAB — CBC WITH DIFFERENTIAL/PLATELET
BASO%: 1.2 % (ref 0.0–2.0)
BASOS ABS: 0 10*3/uL (ref 0.0–0.1)
EOS ABS: 0 10*3/uL (ref 0.0–0.5)
EOS%: 1.4 % (ref 0.0–7.0)
HEMATOCRIT: 36.6 % (ref 34.8–46.6)
HEMOGLOBIN: 12.4 g/dL (ref 11.6–15.9)
LYMPH#: 1.6 10*3/uL (ref 0.9–3.3)
LYMPH%: 57.8 % — ABNORMAL HIGH (ref 14.0–49.7)
MCH: 33.9 pg (ref 25.1–34.0)
MCHC: 33.9 g/dL (ref 31.5–36.0)
MCV: 100 fL (ref 79.5–101.0)
MONO#: 0.5 10*3/uL (ref 0.1–0.9)
MONO%: 17.3 % — ABNORMAL HIGH (ref 0.0–14.0)
NEUT#: 0.6 10*3/uL — ABNORMAL LOW (ref 1.5–6.5)
NEUT%: 22.3 % — AB (ref 38.4–76.8)
Platelets: 89 10*3/uL — ABNORMAL LOW (ref 145–400)
RBC: 3.66 10*6/uL — ABNORMAL LOW (ref 3.70–5.45)
RDW: 14.3 % (ref 11.2–14.5)
WBC: 2.8 10*3/uL — ABNORMAL LOW (ref 3.9–10.3)

## 2015-10-06 LAB — COMPREHENSIVE METABOLIC PANEL (CC13)
ALBUMIN: 3.5 g/dL (ref 3.5–5.0)
ALK PHOS: 70 U/L (ref 40–150)
ALT: 18 U/L (ref 0–55)
ANION GAP: 8 meq/L (ref 3–11)
AST: 16 U/L (ref 5–34)
BUN: 8.1 mg/dL (ref 7.0–26.0)
CALCIUM: 9.4 mg/dL (ref 8.4–10.4)
CO2: 31 mEq/L — ABNORMAL HIGH (ref 22–29)
Chloride: 100 mEq/L (ref 98–109)
Creatinine: 0.6 mg/dL (ref 0.6–1.1)
EGFR: 89 mL/min/{1.73_m2} — AB (ref >90)
Glucose: 104 mg/dl (ref 70–140)
POTASSIUM: 4.1 meq/L (ref 3.5–5.1)
Sodium: 138 mEq/L (ref 136–145)
Total Bilirubin: 0.4 mg/dL (ref 0.20–1.20)
Total Protein: 5.9 g/dL — ABNORMAL LOW (ref 6.4–8.3)

## 2015-10-06 MED ORDER — TBO-FILGRASTIM 300 MCG/0.5ML ~~LOC~~ SOSY
300.0000 ug | PREFILLED_SYRINGE | Freq: Once | SUBCUTANEOUS | Status: AC
  Administered 2015-10-06: 300 ug via SUBCUTANEOUS
  Filled 2015-10-06: qty 0.5

## 2015-10-06 NOTE — Telephone Encounter (Signed)
Gave and printed appt sched and avs fo rpt; for NOV  °

## 2015-10-06 NOTE — Progress Notes (Signed)
OFFICE PROGRESS NOTE   October 06, 2015   Physicians:D. 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 CT AP 09-10-15 shows resolution of involvement other than in spleen, which is improved. She saw Dr Josephina Shih on 09-12-15 , with discussion of splenectomy followed by avastin vs 3 additional cycles of chemo + avastin then reassess. Patient prefers additional treatment to splenectomy now.  She tolerated chemo with granix 300 mg x4 doses beginning day after chemo, and oral decadron x 4 days after chemo. She did not need additional IVF for last treatment.  She reports having more fatigue with this last cycle of chemo. Denies fever, chills, cough, chest pain, shortness of breath. Denies bleeding.  Appetite is good, bowels moving regularly with ongoing laxatives, no abdominal or pelvic discomfort. Back pain is much better and she cancelled her appointment with Ortho.  No problems with PAC.    Flu vaccine 09-12-15 PAC placed by IR 04-21-15 Genetics testing BreastOvarian panel by [facility] sent 06-26-15: normal CA 125 on 05-01-15 33  ONCOLOGIC HISTORY Patient presented to Dr Abbie Sons in late Jan 2014 after bright red spotting that AM following 2 weeks of spotting in early Dec 2015. with uterus larger than had been apparent on exam in Oct 2013. Sonohystogram 01-05-13 showed uterus normal size and echotexture, endometrium 4.3 mm, left ovary normal and right adnexa with 1.1 x 8.4.x8.2 cm cystic and solid mass. Endometrial biopsy benign and CA 125 also on 01-05-13 was 178.8. She had CT AP 01-17-13 with 1.0 x 6.9 x 8.9 cm complex right ovarian mass, no ascites, small retroperitoneal nodes. She was seen by Dr Skeet Latch on  01-18-13 and taken to surgery by Dr Josephina Shih on 02-13-13, which was TAH/BSO/ omentectomy/ureterolysis/ resection of cul-de-sac tumor/right pelvic lymphadnectomy and resection of rectum with reanastomosis. At completion of surgery there was no gross residual disease. 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 [place] [phone]) had rare clusters of atypical cells. Chemotherapy with dose dense taxol carboplatin was begun day 1 cycle 1 on 03-20-13; ANC was 1.1 on day 15 cycle 1 with taxol given and neupogen added 04-04-13. She had day 1 cycle 2 treatment on 04-17-13, then was briefly hospitalized 5-22 to 04-20-13 after syncopal episode, with antihypertensive agents DCd and UTI treated. She was feeling much better at time of "day 8" cycle 2 on 05-01-13 and did have neupogen 300 mcg x 1 dose on 05-02-13. She was readmitted to hospital 6-5 thru 05-05-13 with fever, empirically on antibiotics and blood cultures negative. Cycle 6 completed 08-14-13. CT AP 09-25-13 had no evidence of cancer and CA 125 was 18.3 on 07-24-2013. Marker was 20 in early Dec 2015, 26 on 01-31-15 and 35 on 03-14-15. PET 04-03-15 documented disease bilateral pelvic sidewalls, vaginal cuff and scattered areas thru abdomen/ pelvis, as well as area of uptake in spleen. She resumed carboplatin taxol using q 3 week regimen on 05-01-15. She was neutropenic with ANC 0.3 on day 9 despite beginning granix on day 5. Avastin added with cycle 2 chemo on 05-26-15. CT AP 07-12-15 with  improvement in splenic lesion and pelvic adenopathy, still area at posterior stomach, no new involvement; CA 125 down to 14 prior to cycle 4    Review of systems as above, also: No LE swelling. Peripheral neuropathy feet >hands fairly  minimal Remainder of 10 point Review of Systems negative.  Objective:  Vital signs in last 24 hours:  BP 167/78 mmHg  Pulse 64  Temp(Src) 97.7 F (36.5 C) (Oral)  Resp 18  Ht 5' 3"  (1.6 m)  Wt 137 lb 6.4 oz (62.324 kg)  BMI 24.35 kg/m2  SpO2 100% Weight up 1 lbs Alert, oriented and appropriate.   Alopecia  HEENT:PERRL, sclerae not icteric. Oral mucosa moist without lesions, posterior pharynx clear.  Neck supple. No JVD.  Lymphatics:no cervical,supraclavicular, axillary or inguinal adenopathy Resp: clear to auscultation bilaterally and normal percussion bilaterally Cardio: regular rate and rhythm. No gallop. GI: soft, nontender, not distended, no mass or organomegaly. Normally active bowel sounds. Surgical incision not remarkable. Musculoskeletal/ Extremities: without pitting edema, cords, tenderness. Point tender reproducing pain right upper SI Neuro: peripheral neuropathy a little better hands and feet. Otherwise nonfocal. PSYCH appropriate mood and affect. Skin without rash, ecchymosis, petechiae. Portacath-without erythema or tenderness  Lab Results:  Results for orders placed or performed in visit on 10/06/15  CBC with Differential  Result Value Ref Range   WBC 2.8 (L) 3.9 - 10.3 10e3/uL   NEUT# 0.6 (L) 1.5 - 6.5 10e3/uL   HGB 12.4 11.6 - 15.9 g/dL   HCT 36.6 34.8 - 46.6 %   Platelets 89 (L) 145 - 400 10e3/uL   MCV 100.0 79.5 - 101.0 fL   MCH 33.9 25.1 - 34.0 pg   MCHC 33.9 31.5 - 36.0 g/dL   RBC 3.66 (L) 3.70 - 5.45 10e6/uL   RDW 14.3 11.2 - 14.5 %   lymph# 1.6 0.9 - 3.3 10e3/uL   MONO# 0.5 0.1 - 0.9 10e3/uL   Eosinophils Absolute 0.0 0.0 - 0.5 10e3/uL   Basophils Absolute 0.0 0.0 - 0.1 10e3/uL   NEUT% 22.3 (L) 38.4 - 76.8 %   LYMPH% 57.8 (H) 14.0 - 49.7 %   MONO% 17.3 (H) 0.0 - 14.0 %   EOS% 1.4 0.0 - 7.0 %   BASO% 1.2 0.0 - 2.0 %  Comprehensive metabolic panel (Cmet) - [facility]  Result Value Ref Range   Sodium 138 136 - 145 mEq/L   Potassium 4.1 3.5 - 5.1  mEq/L   Chloride 100 98 - 109 mEq/L   CO2 31 (H) 22 - 29 mEq/L   Glucose 104 70 - 140 mg/dl   BUN 8.1 7.0 - 26.0 mg/dL   Creatinine 0.6 0.6 - 1.1 mg/dL   Total Bilirubin 0.40 0.20 - 1.20 mg/dL   Alkaline Phosphatase 70 40 - 150 U/L   AST 16 5 - 34 U/L   ALT 18 0 - 55 U/L   Total Protein 5.9 (L) 6.4 - 8.3 g/dL   Albumin 3.5 3.5 - 5.0 g/dL   Calcium 9.4 8.4 - 10.4 mg/dL   Anion Gap 8 3 - 11 mEq/L   EGFR 89 (L) >90 ml/min/1.73 m2    Medications: I have reviewed the patient's current medications.  DISCUSSION: Labs reviewed with the patient. ANC is 0.6 today despite Granix X 4 days last week. Plt are 89,000. She is afebrile and has no signs of infection; no bleeding. Discussed with Dr. Marko Plume. Will administer Granix 11/7 & 11/8. Recheck CBC on 11/10 with possible Granix injection on that  date after labs reviewed by Dr. Marko Plume. Patient advised to hold ASA and Plavix until recheck of labs on 11/10.  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 Gradual increase in CA125 March and April 2016 with PET evidence of progressive disease pelvis and abdomen, asymptomatic. Resumed chemotherapy with carboplatin and taxol using q 3 week dosing, cycle 1 on 05-01-15, addition of avastin with cycle 2, thru cycle 6 on 08-25-15. Restaging CT shows improvement, with residual in spleen. Plan additional 3 cycles carbo taxol avastin beginning next week, with skin testing prior to each [place] and  Support with granix and steroids. Note platelet count of 111k and ANC 1.8 on 09/25/15, carbo dosing adjusted.  2.PAC in 3.HTN: follow with Avastin, recent  med changes by cardiology 4.paroxysmal Afib: followed by Dr Ellyn Hack Note on plavix and ASA if platelets drop with chemo.  5.chronic constipation requiring increased laxatives with chemo, up to date on  colonoscopy.  6.hypothyroid on replacement by PCP 7. Left knee replacement 12-2014, doing well. Right hip replacement 2013 for osteoarthritis 8. Long estrogen replacement, which she still uses. She has previously refused to discontinue the estrogen. Initial surgical path from 02-13-13 does not report ER PR testing. 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 Breast tissue heterogeneously dense by last tomo mammograms at [facility] 03-21-15, those mammograms otherwise negative.  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 GERD: better with protonix, continue 12. flu vaccine 09-12-15   All questions answered and patient is in agreement with plans. Granix orders placed.  Time spent 30 min including >50% counseling and coordination of care. Cc Dr Dessie Coma, NP   10/06/2015, 3:31 PM

## 2015-10-07 ENCOUNTER — Ambulatory Visit: Payer: Medicare Other

## 2015-10-07 ENCOUNTER — Ambulatory Visit (HOSPITAL_BASED_OUTPATIENT_CLINIC_OR_DEPARTMENT_OTHER): Payer: Medicare Other

## 2015-10-07 ENCOUNTER — Telehealth: Payer: Self-pay | Admitting: Pharmacist Clinician (PhC)/ Clinical Pharmacy Specialist

## 2015-10-07 VITALS — BP 144/81 | HR 71 | Temp 98.1°F

## 2015-10-07 DIAGNOSIS — C561 Malignant neoplasm of right ovary: Secondary | ICD-10-CM

## 2015-10-07 DIAGNOSIS — C569 Malignant neoplasm of unspecified ovary: Secondary | ICD-10-CM

## 2015-10-07 DIAGNOSIS — C562 Malignant neoplasm of left ovary: Secondary | ICD-10-CM | POA: Diagnosis not present

## 2015-10-07 DIAGNOSIS — Z5189 Encounter for other specified aftercare: Secondary | ICD-10-CM | POA: Diagnosis not present

## 2015-10-07 DIAGNOSIS — R971 Elevated cancer antigen 125 [CA 125]: Secondary | ICD-10-CM

## 2015-10-07 MED ORDER — TBO-FILGRASTIM 300 MCG/0.5ML ~~LOC~~ SOSY
300.0000 ug | PREFILLED_SYRINGE | Freq: Once | SUBCUTANEOUS | Status: AC
  Administered 2015-10-07: 300 ug via SUBCUTANEOUS
  Filled 2015-10-07: qty 0.5

## 2015-10-07 MED ORDER — HYDRALAZINE HCL 50 MG PO TABS: 50.0000 mg | ORAL_TABLET | Freq: Two times a day (BID) | ORAL | Status: DC

## 2015-10-07 NOTE — Telephone Encounter (Signed)
Pt called, LMOM stating since switching off amlodipine to hydralazine, BP has gone up.  Runs 130-160s, but still getting too many readings > 150 and is afraid this will delay some of her chemotherapy.   Returned call, [facility] for patient to increase hydralazine from 25 to 50 mg bid.  She is to continue with home BP monitoring and call in 7-10 days with her BP readings.

## 2015-10-08 ENCOUNTER — Other Ambulatory Visit: Payer: Self-pay | Admitting: Oncology

## 2015-10-09 ENCOUNTER — Ambulatory Visit: Payer: Medicare Other

## 2015-10-09 ENCOUNTER — Other Ambulatory Visit (HOSPITAL_BASED_OUTPATIENT_CLINIC_OR_DEPARTMENT_OTHER): Payer: Medicare Other

## 2015-10-09 ENCOUNTER — Telehealth: Payer: Self-pay | Admitting: *Deleted

## 2015-10-09 DIAGNOSIS — C569 Malignant neoplasm of unspecified ovary: Secondary | ICD-10-CM

## 2015-10-09 DIAGNOSIS — D701 Agranulocytosis secondary to cancer chemotherapy: Secondary | ICD-10-CM

## 2015-10-09 DIAGNOSIS — C561 Malignant neoplasm of right ovary: Secondary | ICD-10-CM | POA: Diagnosis not present

## 2015-10-09 DIAGNOSIS — D6959 Other secondary thrombocytopenia: Secondary | ICD-10-CM

## 2015-10-09 DIAGNOSIS — T451X5A Adverse effect of antineoplastic and immunosuppressive drugs, initial encounter: Secondary | ICD-10-CM

## 2015-10-09 LAB — CBC WITH DIFFERENTIAL/PLATELET
BASO%: 0.2 % (ref 0.0–2.0)
BASOS ABS: 0 10*3/uL (ref 0.0–0.1)
EOS ABS: 0 10*3/uL (ref 0.0–0.5)
EOS%: 0.2 % (ref 0.0–7.0)
HEMATOCRIT: 36.8 % (ref 34.8–46.6)
HEMOGLOBIN: 12 g/dL (ref 11.6–15.9)
LYMPH#: 1.6 10*3/uL (ref 0.9–3.3)
LYMPH%: 20 % (ref 14.0–49.7)
MCH: 33.3 pg (ref 25.1–34.0)
MCHC: 32.6 g/dL (ref 31.5–36.0)
MCV: 102.2 fL — AB (ref 79.5–101.0)
MONO#: 0.7 10*3/uL (ref 0.1–0.9)
MONO%: 8.3 % (ref 0.0–14.0)
NEUT%: 71.3 % (ref 38.4–76.8)
NEUTROS ABS: 5.8 10*3/uL (ref 1.5–6.5)
PLATELETS: 82 10*3/uL — AB (ref 145–400)
RBC: 3.6 10*6/uL — ABNORMAL LOW (ref 3.70–5.45)
RDW: 14.8 % — AB (ref 11.2–14.5)
WBC: 8.2 10*3/uL (ref 3.9–10.3)

## 2015-10-09 NOTE — Progress Notes (Signed)
[name] here for lab and possible Granix injection.  ANC 5.6   Per Dr Marko Plume she doesn't need injection.

## 2015-10-09 NOTE — Telephone Encounter (Signed)
Labs reviewed by Dr. Marko Plume. Called and instructed patient to not resume Plavix and aspirin at this time. Patient agreeable to this and to appts at [facility] on Monday, 10/13/15.

## 2015-10-12 ENCOUNTER — Other Ambulatory Visit: Payer: Self-pay | Admitting: Oncology

## 2015-10-12 DIAGNOSIS — C569 Malignant neoplasm of unspecified ovary: Secondary | ICD-10-CM

## 2015-10-12 DIAGNOSIS — C7989 Secondary malignant neoplasm of other specified sites: Secondary | ICD-10-CM

## 2015-10-13 ENCOUNTER — Telehealth: Payer: Self-pay | Admitting: Oncology

## 2015-10-13 ENCOUNTER — Encounter: Payer: Self-pay | Admitting: Oncology

## 2015-10-13 ENCOUNTER — Telehealth: Payer: Self-pay | Admitting: Pharmacist Clinician (PhC)/ Clinical Pharmacy Specialist

## 2015-10-13 ENCOUNTER — Ambulatory Visit (HOSPITAL_BASED_OUTPATIENT_CLINIC_OR_DEPARTMENT_OTHER): Payer: Medicare Other | Admitting: Oncology

## 2015-10-13 ENCOUNTER — Other Ambulatory Visit (HOSPITAL_BASED_OUTPATIENT_CLINIC_OR_DEPARTMENT_OTHER): Payer: Medicare Other

## 2015-10-13 VITALS — BP 169/76 | HR 68 | Temp 97.9°F | Resp 17 | Ht 63.0 in | Wt 139.4 lb

## 2015-10-13 DIAGNOSIS — C7989 Secondary malignant neoplasm of other specified sites: Secondary | ICD-10-CM

## 2015-10-13 DIAGNOSIS — D6959 Other secondary thrombocytopenia: Secondary | ICD-10-CM

## 2015-10-13 DIAGNOSIS — C562 Malignant neoplasm of left ovary: Secondary | ICD-10-CM

## 2015-10-13 DIAGNOSIS — C569 Malignant neoplasm of unspecified ovary: Secondary | ICD-10-CM | POA: Diagnosis not present

## 2015-10-13 DIAGNOSIS — D701 Agranulocytosis secondary to cancer chemotherapy: Secondary | ICD-10-CM

## 2015-10-13 DIAGNOSIS — T451X5A Adverse effect of antineoplastic and immunosuppressive drugs, initial encounter: Secondary | ICD-10-CM

## 2015-10-13 DIAGNOSIS — Z95828 Presence of other vascular implants and grafts: Secondary | ICD-10-CM

## 2015-10-13 DIAGNOSIS — C563 Malignant neoplasm of bilateral ovaries: Secondary | ICD-10-CM

## 2015-10-13 DIAGNOSIS — C561 Malignant neoplasm of right ovary: Secondary | ICD-10-CM | POA: Diagnosis not present

## 2015-10-13 DIAGNOSIS — C7889 Secondary malignant neoplasm of other digestive organs: Secondary | ICD-10-CM

## 2015-10-13 LAB — COMPREHENSIVE METABOLIC PANEL (CC13)
ALT: 15 U/L (ref 0–55)
AST: 17 U/L (ref 5–34)
Albumin: 3.5 g/dL (ref 3.5–5.0)
Alkaline Phosphatase: 71 U/L (ref 40–150)
Anion Gap: 8 mEq/L (ref 3–11)
BUN: 8 mg/dL (ref 7.0–26.0)
CHLORIDE: 104 meq/L (ref 98–109)
CO2: 27 meq/L (ref 22–29)
Calcium: 9.1 mg/dL (ref 8.4–10.4)
Creatinine: 0.7 mg/dL (ref 0.6–1.1)
EGFR: 89 mL/min/{1.73_m2} — AB (ref >90)
GLUCOSE: 85 mg/dL (ref 70–140)
POTASSIUM: 3.8 meq/L (ref 3.5–5.1)
SODIUM: 139 meq/L (ref 136–145)
Total Bilirubin: 0.42 mg/dL (ref 0.20–1.20)
Total Protein: 6.1 g/dL — ABNORMAL LOW (ref 6.4–8.3)

## 2015-10-13 LAB — CBC WITH DIFFERENTIAL/PLATELET
BASO%: 0.6 % (ref 0.0–2.0)
BASOS ABS: 0 10*3/uL (ref 0.0–0.1)
EOS%: 0.8 % (ref 0.0–7.0)
Eosinophils Absolute: 0 10*3/uL (ref 0.0–0.5)
HCT: 35.7 % (ref 34.8–46.6)
HEMOGLOBIN: 11.9 g/dL (ref 11.6–15.9)
LYMPH%: 35.2 % (ref 14.0–49.7)
MCH: 33.8 pg (ref 25.1–34.0)
MCHC: 33.3 g/dL (ref 31.5–36.0)
MCV: 101.3 fL — AB (ref 79.5–101.0)
MONO#: 0.6 10*3/uL (ref 0.1–0.9)
MONO%: 14.5 % — AB (ref 0.0–14.0)
NEUT#: 2.1 10*3/uL (ref 1.5–6.5)
NEUT%: 48.9 % (ref 38.4–76.8)
Platelets: 137 10*3/uL — ABNORMAL LOW (ref 145–400)
RBC: 3.52 10*6/uL — AB (ref 3.70–5.45)
RDW: 14.8 % — AB (ref 11.2–14.5)
WBC: 4.2 10*3/uL (ref 3.9–10.3)
lymph#: 1.5 10*3/uL (ref 0.9–3.3)

## 2015-10-13 LAB — UA PROTEIN, DIPSTICK - CHCC: Protein, ur: NEGATIVE mg/dL

## 2015-10-13 MED ORDER — CLONIDINE HCL 0.1 MG PO TABS: 0.1000 mg | ORAL_TABLET | Freq: Two times a day (BID) | ORAL | Status: DC

## 2015-10-13 MED ORDER — DEXAMETHASONE 4 MG PO TABS
ORAL_TABLET | ORAL | Status: DC
Start: 2015-10-13 — End: 2015-12-09

## 2015-10-13 NOTE — Progress Notes (Signed)
OFFICE PROGRESS NOTE   October 13, 2015   Physicians:D. 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 Plavix and ASA were held on 10-09-15 for platelets 82k.  Patient had cycle 7 carbo taxol avastin on 09-29-15 with gCSF x6 necessary for recovery of ANC, no IVF afterwards, decadron x4 days after chemo as previously. Patient reports that she has been more fatigued and that this has lasted longer since most recent treatment. She did have carbo skin test, nonreactive, and no acute problems during chemo. She has had changes in antihypertensives, is to follow up with cardiology upcoming; she believes that new hydralazine is causing HA late in day, tho she takes this bid. She has been eating well, tho taste not correct, but has not been doing her regular exercise walking. Bowels are moving adequately with usual several medications; bladder ok. No bleeding or unusual bruising. She did sleep after taking benadryl last pm. No change in peripheral neuropathy in feet only, mostly in individual toes bilaterally and not interfering with walking. No abdominal or pelvic discomfort. No problems with PAC  Remainder of 10 point Review of Systems negative  .    Flu vaccine 09-12-15 PAC placed by IR 04-21-15 Genetics testing BreastOvarian panel by [facility] sent 06-26-15: normal CA 125 on 05-01-15 33     ONCOLOGIC HISTORY Patient presented in late Jan 2014 with 2 weeks of spotting in early Dec 2015. Sonohystogram 01-05-13 showed uterus normal size and echotexture, endometrium 4.3 mm, left ovary normal and right adnexa with 1.1 x 8.4.x8.2 cm cystic and solid mass. Endometrial biopsy benign and CA 125 also on 01-05-13 was  178.8. CT AP 01-17-13 with 1.0 x 6.9 x 8.9 cm complex right ovarian mass, no ascites, small retroperitoneal nodes. She was seen by Dr Skeet Latch on 01-18-13 and taken to surgery by Dr Josephina Shih on 02-13-13, which was TAH/BSO/ omentectomy/ureterolysis/ resection of cul-de-sac tumor/right pelvic lymphadnectomy and resection of rectum with reanastomosis. At completion of surgery there was no gross residual disease. 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 Washings [phone]) had rare clusters of atypical cells. Chemotherapy with dose dense taxol carboplatin was begun day 1 cycle 1 on 03-20-13; ANC was 1.1 on day 15 cycle 1 with taxol given and neupogen added 04-04-13. She had day 1 cycle 2 treatment on 04-17-13, then was briefly hospitalized 5-22 to 04-20-13 after syncopal episode, with antihypertensive agents DCd and UTI treated. She was feeling much better at time of "day 8" cycle 2 on 05-01-13 and did have neupogen 300 mcg x 1 dose on 05-02-13. She was readmitted to hospital 6-5 thru 05-05-13 with fever, empirically on antibiotics and blood cultures negative. Cycle 6 completed 08-14-13. CT AP 09-25-13 had no evidence of cancer and CA 125 was 18.3 on 07-24-2013. Marker was 20 in early Dec 2015, 26 on 01-31-15 and 35 on 03-14-15. PET 04-03-15 documented disease bilateral pelvic sidewalls, vaginal cuff and scattered areas thru abdomen/ pelvis, as well as area of uptake in spleen. She resumed carboplatin taxol using q 3 week regimen on 05-01-15. She was neutropenic with ANC 0.3 on  day 9 despite beginning granix on day 5.  She  had 6 cycles of carbo taxol from 05-01-15 thru 08-25-15, with avastin cycles 2,3,4, 6. CT AP 09-10-15 showed resolution of involvement other than in spleen,  which was improved. She saw Dr Josephina Shih on 09-12-15 , with discussion of splenectomy followed by avastin vs 3 additional cycles of chemo + avastin then reassess. Patient preferred additional treatment to splenectomy, with carbo taxol avastin resumed 09-29-15.     Objective:  Vital signs in last 24 hours:  BP 169/76 mmHg  Pulse 68  Temp(Src) 97.9 F (36.6 C) (Oral)  Resp 17  Ht 5' 3"  (1.6 m)  Wt 139 lb 6.4 oz (63.231 kg)  BMI 24.70 kg/m2  SpO2 98% Weight is up 2 lbs Alert, oriented and appropriate. Ambulatory without assistance difficulty.  Alopecia  HEENT:PERRL, sclerae not icteric. Oral mucosa moist without lesions, posterior pharynx clear.  Neck supple. No JVD.  Lymphatics:no cervical,supraclavicular adenopathy Resp: clear to auscultation bilaterally and normal percussion bilaterally Cardio: regular rate and rhythm. No gallop. GI: soft, nontender, not distended, no mass or organomegaly. Normally active bowel sounds. Surgical incision not remarkable. Musculoskeletal/ Extremities: trace pedal edema bilaterally without cords, tenderness Neuro: no change peripheral neuropathy. Otherwise nonfocal. PSYCH appropriate mood and affect Skin without rash, ecchymosis, petechiae Portacath-without erythema or tenderness  Lab Results:  Results for orders placed or performed in visit on 10/13/15  CBC with Differential  Result Value Ref Range   WBC 4.2 3.9 - 10.3 10e3/uL   NEUT# 2.1 1.5 - 6.5 10e3/uL   HGB 11.9 11.6 - 15.9 g/dL   HCT 35.7 34.8 - 46.6 %   Platelets 137 (L) 145 - 400 10e3/uL   MCV 101.3 (H) 79.5 - 101.0 fL   MCH 33.8 25.1 - 34.0 pg   MCHC 33.3 31.5 - 36.0 g/dL   RBC 3.52 (L) 3.70 - 5.45 10e6/uL   RDW 14.8 (H) 11.2 - 14.5 %   lymph# 1.5 0.9 - 3.3 10e3/uL   MONO# 0.6 0.1 - 0.9 10e3/uL   Eosinophils Absolute 0.0 0.0 - 0.5 10e3/uL   Basophils Absolute 0.0 0.0 - 0.1 10e3/uL   NEUT% 48.9 38.4 - 76.8 %   LYMPH% 35.2 14.0 - 49.7 %   MONO% 14.5 (H) 0.0 - 14.0 %    EOS% 0.8 0.0 - 7.0 %   BASO% 0.6 0.0 - 2.0 %  Comprehensive metabolic panel (Cmet) - [facility]  Result Value Ref Range   Sodium 139 136 - 145 mEq/L   Potassium 3.8 3.5 - 5.1 mEq/L   Chloride 104 98 - 109 mEq/L   CO2 27 22 - 29 mEq/L   Glucose 85 70 - 140 mg/dl   BUN 8.0 7.0 - 26.0 mg/dL   Creatinine 0.7 0.6 - 1.1 mg/dL   Total Bilirubin 0.42 0.20 - 1.20 mg/dL   Alkaline Phosphatase 71 40 - 150 U/L   AST 17 5 - 34 U/L   ALT 15 0 - 55 U/L   Total Protein 6.1 (L) 6.4 - 8.3 g/dL   Albumin 3.5 3.5 - 5.0 g/dL   Calcium 9.1 8.4 - 10.4 mg/dL   Anion Gap 8 3 - 11 mEq/L   EGFR 89 (L) >90 ml/min/1.73 m2  Urine protein by dipstick - [facility]  Result Value Ref Range   Protein, ur Negative Negative- <30 mg/dL     Studies/Results:  No results found.  Medications: I have reviewed the patient's current medications. Will change gCSF to neulasta with next  treatment, does not want On Pro injector  DISCUSSION: interval history reviewed. Fatigue not unexpected given amount of chemo thus far + changes in BP meds. Patient is willing to continue as planned, will change granix to neulasta as she requests now.  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 Gradual increase in CA125 March and April 2016 with PET evidence of progressive disease pelvis and abdomen, asymptomatic. Resumed chemotherapy with carboplatin and taxol using q 3 week dosing, cycle 1 on 05-01-15, addition of avastin with cycle 2, thru cycle 6 on 08-25-15. Restaging CT shows improvement, with residual in spleen. Plan additional 3 cycles carbo taxol avastin begun 09-29-15, with skin testing prior to each [place] and gCSF + steroids.  2.PAC in 3.HTN: follow with Avastin, recent med changes by cardiology 4.paroxysmal Afib: followed by Dr Ellyn Hack Again had to hold plavix and ASA  with mild thrombocytopenia this cycle  5.chronic constipation requiring increased laxatives with chemo, up to date on colonoscopy.  6.hypothyroid on replacement by PCP 7. Left knee replacement 12-2014, doing well. Right hip replacement 2013 for osteoarthritis 8. Long estrogen replacement, which she still uses. She has previously refused to discontinue the estrogen. Initial surgical path from 02-13-13 does not report ER PR testing. 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 Breast tissue heterogeneously dense by last tomo mammograms at [facility] 03-21-15, those mammograms otherwise negative.  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 GERD: better with protonix, continue 12. flu vaccine 09-12-15   All questions answered. Chemo orders confirmed, granix changed to neulasta, [facility] financial staff notified for preauth. Cc DR Virgina Jock. Time spent 25 min including >50% counseling and coordination of care.   Gordy Levan, MD   10/13/2015, 5:49 PM

## 2015-10-13 NOTE — Telephone Encounter (Signed)
Pt called this AM, still having problems with BP meds.  Was recently increased on hydralazine to 50 mg bid, however this has been causing her to have headaches and ankle swelling.  Would like to get off the medication.    Reviewed her chart.  Pt on valsartan 320 mg qd and metoprolol 25 mg bid.  Was unable to tolerate amlodipine for ankle swelling.  Is not recommended that she take any diuretics at this time because of the need to stay hydrated while on chemo.  Patient understands that there are not many options.  Will try clonidine 0.1 mg bid and she is to call next week with BP readings

## 2015-10-13 NOTE — Telephone Encounter (Signed)
Appointments made and avs printed for patient °

## 2015-10-19 ENCOUNTER — Telehealth: Payer: Self-pay | Admitting: Physician Assistant

## 2015-10-19 NOTE — Telephone Encounter (Signed)
Spoke with pt. She is having side effects from the clonidine.  She feels very SOB with exertion. No SOB at rest.   This is new since starting the clonidine.  She denies orthopnea or PND, no LE edema.  Discussed options. She has had S.E. With several medications.   Best option: she will take 1/2 clonidine tonight. Tomorrow, she will call Erasmo Downer to discuss options, but since she decreased the dose, she may be able to stop the clonidine completely if there is another option.    Rosaria Ferries, Hershal Coria 10/19/2015 2:11 PM Beeper [phone]

## 2015-10-20 ENCOUNTER — Telehealth: Payer: Self-pay | Admitting: *Deleted

## 2015-10-20 ENCOUNTER — Telehealth: Payer: Self-pay | Admitting: Oncology

## 2015-10-20 ENCOUNTER — Other Ambulatory Visit: Payer: Self-pay | Admitting: Oncology

## 2015-10-20 ENCOUNTER — Telehealth: Payer: Self-pay | Admitting: Pharmacist Clinician (PhC)/ Clinical Pharmacy Specialist

## 2015-10-20 MED ORDER — CARVEDILOL 12.5 MG PO TABS: 12.5000 mg | ORAL_TABLET | Freq: Two times a day (BID) | ORAL | Status: DC

## 2015-10-20 NOTE — Telephone Encounter (Signed)
Medical Oncology  Returned phone call to patient re antihypertensives. Blood pressure has been more elevated likely related to ongoing avastin, recently 150-160/ 78-80, tho at times systolic is 0000000. She has had difficulty tolerating clonidine tried last week (fatigue or weakness) and with amlodipine (painful swelling of ankles). Pharmacist with Dr Ellyn Hack suggested trying diuretic instead, however she has in past been very symptomatic from dehydration around chemo treatments.  I mentioned that we could hold avastin, which she prefers not to do, as the chemo + avastin combination has been improving this recurrent gyn cancer. I suggested that diuretic might be tolerated if she waits 7-10 days after chemo before she begins that.  We plan treatment on 11-22 then one additional cycle in 3 weeks, then repeat scans.   She expects call from cardiology pharmacist later today; I am glad to speak with the pharmacist if needed.  Have left chemo + avastin as planned for 10-21-15.  Patient appreciated call.  Godfrey Pick, MD

## 2015-10-20 NOTE — Telephone Encounter (Signed)
Spoke with patient.  She has been having problems with high blood pressure due to her chemotherapy.  Her cardiologist, Dr. Glenetta Hew, has tried 3 different medications and none of them have worked.  He would like to try a diuretic but patient is not sure if she should try this given she is "trying to push fluids with her chemo."  She would like Dr. Mariana Kaufman input on this before she tries diuretic.  She does not know the name of the diuretic.  Her call back is [phone].

## 2015-10-20 NOTE — Telephone Encounter (Signed)
Pt could not tolerate clonidine; has previously failed hydralazine and amlodipine as well.  On valsartan 320 mg qd.  Cannot take fluid pills d/t chemotherapy.  Reviewed with Dr. Ellyn Hack, will switch metoprolol 50 mg bid to carvedilol 12.5 mg bid.  Pt states she took amlodipine last night, just trying to keep her pressure down after stopping the clonidine.  Suggested she take another dose of that tonight only, then d/c, as she has a chemotherapy tx tomorrow, assuming her BP is acceptable.   Pt voiced understanding.

## 2015-10-21 ENCOUNTER — Ambulatory Visit: Payer: Medicare Other

## 2015-10-21 ENCOUNTER — Other Ambulatory Visit: Payer: Self-pay | Admitting: Oncology

## 2015-10-21 ENCOUNTER — Ambulatory Visit (HOSPITAL_BASED_OUTPATIENT_CLINIC_OR_DEPARTMENT_OTHER): Payer: Medicare Other

## 2015-10-21 ENCOUNTER — Other Ambulatory Visit (HOSPITAL_BASED_OUTPATIENT_CLINIC_OR_DEPARTMENT_OTHER): Payer: Medicare Other

## 2015-10-21 VITALS — BP 136/66 | HR 58 | Temp 97.9°F | Resp 16

## 2015-10-21 DIAGNOSIS — Z5112 Encounter for antineoplastic immunotherapy: Secondary | ICD-10-CM | POA: Diagnosis not present

## 2015-10-21 DIAGNOSIS — C7989 Secondary malignant neoplasm of other specified sites: Secondary | ICD-10-CM

## 2015-10-21 DIAGNOSIS — C569 Malignant neoplasm of unspecified ovary: Secondary | ICD-10-CM | POA: Diagnosis not present

## 2015-10-21 DIAGNOSIS — Z5111 Encounter for antineoplastic chemotherapy: Secondary | ICD-10-CM | POA: Diagnosis not present

## 2015-10-21 DIAGNOSIS — Z95828 Presence of other vascular implants and grafts: Secondary | ICD-10-CM

## 2015-10-21 LAB — CBC WITH DIFFERENTIAL/PLATELET
BASO%: 0.6 % (ref 0.0–2.0)
BASOS ABS: 0 10*3/uL (ref 0.0–0.1)
EOS ABS: 0 10*3/uL (ref 0.0–0.5)
EOS%: 0 % (ref 0.0–7.0)
HEMATOCRIT: 37.8 % (ref 34.8–46.6)
HEMOGLOBIN: 12.8 g/dL (ref 11.6–15.9)
LYMPH%: 14.1 % (ref 14.0–49.7)
MCH: 34.3 pg — AB (ref 25.1–34.0)
MCHC: 34 g/dL (ref 31.5–36.0)
MCV: 100.9 fL (ref 79.5–101.0)
MONO#: 0 10*3/uL — AB (ref 0.1–0.9)
MONO%: 1 % (ref 0.0–14.0)
NEUT%: 84.3 % — ABNORMAL HIGH (ref 38.4–76.8)
NEUTROS ABS: 2.1 10*3/uL (ref 1.5–6.5)
PLATELETS: 182 10*3/uL (ref 145–400)
RBC: 3.74 10*6/uL (ref 3.70–5.45)
RDW: 15.1 % — AB (ref 11.2–14.5)
WBC: 2.4 10*3/uL — AB (ref 3.9–10.3)
lymph#: 0.3 10*3/uL — ABNORMAL LOW (ref 0.9–3.3)

## 2015-10-21 LAB — COMPREHENSIVE METABOLIC PANEL (CC13)
ALBUMIN: 3.5 g/dL (ref 3.5–5.0)
ALK PHOS: 78 U/L (ref 40–150)
ALT: 28 U/L (ref 0–55)
AST: 17 U/L (ref 5–34)
Anion Gap: 11 mEq/L (ref 3–11)
BILIRUBIN TOTAL: 0.65 mg/dL (ref 0.20–1.20)
BUN: 12.3 mg/dL (ref 7.0–26.0)
CALCIUM: 8.7 mg/dL (ref 8.4–10.4)
CO2: 22 mEq/L (ref 22–29)
Chloride: 102 mEq/L (ref 98–109)
Creatinine: 0.7 mg/dL (ref 0.6–1.1)
EGFR: 89 mL/min/{1.73_m2} — ABNORMAL LOW (ref >90)
Glucose: 215 mg/dl — ABNORMAL HIGH (ref 70–140)
POTASSIUM: 3.6 meq/L (ref 3.5–5.1)
Sodium: 135 mEq/L — ABNORMAL LOW (ref 136–145)
TOTAL PROTEIN: 6 g/dL — AB (ref 6.4–8.3)

## 2015-10-21 MED ORDER — SODIUM CHLORIDE 0.9 % IV SOLN
Freq: Once | INTRAVENOUS | Status: AC
  Administered 2015-10-21: 11:00:00 via INTRAVENOUS

## 2015-10-21 MED ORDER — CARBOPLATIN CHEMO INTRADERMAL TEST DOSE 100MCG/0.02ML
100.0000 ug | Freq: Once | INTRADERMAL | Status: AC
  Administered 2015-10-21: 100 ug via INTRADERMAL
  Filled 2015-10-21: qty 0.01

## 2015-10-21 MED ORDER — PACLITAXEL CHEMO INJECTION 300 MG/50ML
135.0000 mg/m2 | Freq: Once | INTRAVENOUS | Status: AC
  Administered 2015-10-21: 222 mg via INTRAVENOUS
  Filled 2015-10-21: qty 37

## 2015-10-21 MED ORDER — SODIUM CHLORIDE 0.9 % IJ SOLN
10.0000 mL | INTRAMUSCULAR | Status: DC | PRN
  Administered 2015-10-21: 10 mL via INTRAVENOUS
  Filled 2015-10-21: qty 10

## 2015-10-21 MED ORDER — SODIUM CHLORIDE 0.9 % IV SOLN
15.0000 mg/kg | Freq: Once | INTRAVENOUS | Status: AC
  Administered 2015-10-21: 900 mg via INTRAVENOUS
  Filled 2015-10-21: qty 36

## 2015-10-21 MED ORDER — FAMOTIDINE IN NACL 20-0.9 MG/50ML-% IV SOLN
20.0000 mg | Freq: Once | INTRAVENOUS | Status: AC
  Administered 2015-10-21: 20 mg via INTRAVENOUS

## 2015-10-21 MED ORDER — SODIUM CHLORIDE 0.9 % IV SOLN
220.0000 mg | Freq: Once | INTRAVENOUS | Status: AC
  Administered 2015-10-21: 220 mg via INTRAVENOUS
  Filled 2015-10-21: qty 22

## 2015-10-21 MED ORDER — DIPHENHYDRAMINE HCL 50 MG/ML IJ SOLN
25.0000 mg | Freq: Once | INTRAMUSCULAR | Status: AC
  Administered 2015-10-21: 25 mg via INTRAVENOUS

## 2015-10-21 MED ORDER — SODIUM CHLORIDE 0.9 % IJ SOLN
10.0000 mL | INTRAMUSCULAR | Status: DC | PRN
  Administered 2015-10-21: 10 mL
  Filled 2015-10-21: qty 10

## 2015-10-21 MED ORDER — SODIUM CHLORIDE 0.9 % IV SOLN
Freq: Once | INTRAVENOUS | Status: AC
  Administered 2015-10-21: 13:00:00 via INTRAVENOUS
  Filled 2015-10-21: qty 8

## 2015-10-21 MED ORDER — HEPARIN SOD (PORK) LOCK FLUSH 100 UNIT/ML IV SOLN
500.0000 [IU] | Freq: Once | INTRAVENOUS | Status: AC | PRN
  Administered 2015-10-21: 500 [IU]
  Filled 2015-10-21: qty 5

## 2015-10-21 MED ORDER — FAMOTIDINE IN NACL 20-0.9 MG/50ML-% IV SOLN
INTRAVENOUS | Status: AC
  Filled 2015-10-21: qty 50

## 2015-10-21 MED ORDER — DIPHENHYDRAMINE HCL 50 MG/ML IJ SOLN
INTRAMUSCULAR | Status: AC
  Filled 2015-10-21: qty 1

## 2015-10-21 NOTE — Patient Instructions (Signed)

## 2015-10-21 NOTE — Progress Notes (Signed)
Per Pharmacy, pt does not need to provide another urine specimen today- will use the specimen from 10/13/15 as a reference for today.

## 2015-10-24 ENCOUNTER — Ambulatory Visit (HOSPITAL_BASED_OUTPATIENT_CLINIC_OR_DEPARTMENT_OTHER): Payer: Medicare Other

## 2015-10-24 VITALS — BP 176/92 | HR 62 | Temp 97.5°F | Resp 18

## 2015-10-24 DIAGNOSIS — R971 Elevated cancer antigen 125 [CA 125]: Secondary | ICD-10-CM

## 2015-10-24 DIAGNOSIS — C561 Malignant neoplasm of right ovary: Secondary | ICD-10-CM | POA: Diagnosis not present

## 2015-10-24 DIAGNOSIS — C569 Malignant neoplasm of unspecified ovary: Secondary | ICD-10-CM

## 2015-10-24 DIAGNOSIS — Z5189 Encounter for other specified aftercare: Secondary | ICD-10-CM | POA: Diagnosis not present

## 2015-10-24 DIAGNOSIS — C562 Malignant neoplasm of left ovary: Secondary | ICD-10-CM | POA: Diagnosis not present

## 2015-10-24 MED ORDER — HEPARIN SOD (PORK) LOCK FLUSH 100 UNIT/ML IV SOLN
500.0000 [IU] | Freq: Once | INTRAVENOUS | Status: DC
  Filled 2015-10-24: qty 5

## 2015-10-24 MED ORDER — PEGFILGRASTIM INJECTION 6 MG/0.6ML ~~LOC~~
6.0000 mg | PREFILLED_SYRINGE | Freq: Once | SUBCUTANEOUS | Status: AC
  Administered 2015-10-24: 6 mg via SUBCUTANEOUS
  Filled 2015-10-24: qty 0.6

## 2015-10-24 MED ORDER — SODIUM CHLORIDE 0.9 % IJ SOLN
10.0000 mL | INTRAMUSCULAR | Status: DC | PRN
  Filled 2015-10-24: qty 10

## 2015-10-24 NOTE — Addendum Note (Signed)
Addended by: Sharlynn Oliphant A on: 10/24/2015 11:06 AM   Modules accepted: Orders

## 2015-10-27 ENCOUNTER — Telehealth: Payer: Self-pay | Admitting: Pharmacist Clinician (PhC)/ Clinical Pharmacy Specialist

## 2015-10-27 ENCOUNTER — Other Ambulatory Visit: Payer: Self-pay | Admitting: Cardiology

## 2015-10-27 ENCOUNTER — Other Ambulatory Visit: Payer: Self-pay | Admitting: *Deleted

## 2015-10-27 MED ORDER — PANTOPRAZOLE SODIUM 40 MG PO TBEC: 40.0000 mg | DELAYED_RELEASE_TABLET | Freq: Every day | ORAL | Status: DC

## 2015-10-27 NOTE — Telephone Encounter (Signed)
°*  STAT* If patient is at the pharmacy, call can be transferred to refill team.   1. Which medications need to be refilled? (please list name of each medication and dose if known) Pantoprazol 40 mg    2. Which pharmacy/location (including street and city if local pharmacy) is medication to be sent to?[facility] on [address]   3. Do they need a 30 day or 90 day supply? [place]

## 2015-10-27 NOTE — Telephone Encounter (Signed)
Pt called, states has been on carvedilol x 5 full days and BP still elevated, with am readings mostly 0000000 or higher systolic, some readings during the day up to the 180's.  Advised she increase carvedilol to 25 mg bid.  Will contact her oncologist for further recommendations, as we have no other medication options.  Patient will call in another week to let us know how her BP is doing.   Next chemo round not due until Dec 12

## 2015-10-28 MED ORDER — PANTOPRAZOLE SODIUM 40 MG PO TBEC: 40.0000 mg | DELAYED_RELEASE_TABLET | Freq: Every day | ORAL | Status: DC

## 2015-10-28 NOTE — Telephone Encounter (Signed)
Refill submitted to patient's preferred pharmacy.  

## 2015-11-02 ENCOUNTER — Other Ambulatory Visit: Payer: Self-pay | Admitting: Oncology

## 2015-11-02 DIAGNOSIS — C563 Malignant neoplasm of bilateral ovaries: Secondary | ICD-10-CM

## 2015-11-02 DIAGNOSIS — C561 Malignant neoplasm of right ovary: Secondary | ICD-10-CM

## 2015-11-02 DIAGNOSIS — C562 Malignant neoplasm of left ovary: Principal | ICD-10-CM

## 2015-11-03 ENCOUNTER — Telehealth: Payer: Self-pay | Admitting: Pharmacist Clinician (PhC)/ Clinical Pharmacy Specialist

## 2015-11-03 ENCOUNTER — Other Ambulatory Visit (HOSPITAL_BASED_OUTPATIENT_CLINIC_OR_DEPARTMENT_OTHER): Payer: Medicare Other

## 2015-11-03 ENCOUNTER — Ambulatory Visit (HOSPITAL_BASED_OUTPATIENT_CLINIC_OR_DEPARTMENT_OTHER)
Admission: RE | Admit: 2015-11-03 | Discharge: 2015-11-03 | Disposition: A | Discharge status: Home / Self Care | Payer: Medicare Other | Source: Ambulatory Visit | Attending: Oncology | Admitting: Oncology

## 2015-11-03 ENCOUNTER — Ambulatory Visit (HOSPITAL_COMMUNITY)
Admission: RE | Admit: 2015-11-03 | Discharge: 2015-11-03 | Disposition: A | Discharge status: Home / Self Care | Payer: Medicare Other | Source: Ambulatory Visit | Attending: Oncology | Admitting: Oncology

## 2015-11-03 ENCOUNTER — Telehealth: Payer: Self-pay | Admitting: Oncology

## 2015-11-03 ENCOUNTER — Ambulatory Visit (HOSPITAL_BASED_OUTPATIENT_CLINIC_OR_DEPARTMENT_OTHER): Payer: Medicare Other | Admitting: Oncology

## 2015-11-03 ENCOUNTER — Encounter: Payer: Self-pay | Admitting: Oncology

## 2015-11-03 ENCOUNTER — Telehealth: Payer: Self-pay

## 2015-11-03 VITALS — BP 164/90 | HR 71 | Temp 97.5°F | Resp 18 | Ht 63.0 in | Wt 137.4 lb

## 2015-11-03 DIAGNOSIS — C5701 Malignant neoplasm of right fallopian tube: Secondary | ICD-10-CM | POA: Diagnosis not present

## 2015-11-03 DIAGNOSIS — I10 Essential (primary) hypertension: Secondary | ICD-10-CM

## 2015-11-03 DIAGNOSIS — M7989 Other specified soft tissue disorders: Secondary | ICD-10-CM | POA: Diagnosis not present

## 2015-11-03 DIAGNOSIS — I158 Other secondary hypertension: Secondary | ICD-10-CM

## 2015-11-03 DIAGNOSIS — C562 Malignant neoplasm of left ovary: Secondary | ICD-10-CM

## 2015-11-03 DIAGNOSIS — Z79899 Other long term (current) drug therapy: Secondary | ICD-10-CM

## 2015-11-03 DIAGNOSIS — C561 Malignant neoplasm of right ovary: Secondary | ICD-10-CM

## 2015-11-03 DIAGNOSIS — D6959 Other secondary thrombocytopenia: Secondary | ICD-10-CM

## 2015-11-03 DIAGNOSIS — R0602 Shortness of breath: Secondary | ICD-10-CM | POA: Diagnosis not present

## 2015-11-03 DIAGNOSIS — T50905A Adverse effect of unspecified drugs, medicaments and biological substances, initial encounter: Secondary | ICD-10-CM

## 2015-11-03 DIAGNOSIS — I480 Paroxysmal atrial fibrillation: Secondary | ICD-10-CM

## 2015-11-03 DIAGNOSIS — C7889 Secondary malignant neoplasm of other digestive organs: Secondary | ICD-10-CM

## 2015-11-03 DIAGNOSIS — T451X5A Adverse effect of antineoplastic and immunosuppressive drugs, initial encounter: Secondary | ICD-10-CM

## 2015-11-03 DIAGNOSIS — C569 Malignant neoplasm of unspecified ovary: Secondary | ICD-10-CM

## 2015-11-03 DIAGNOSIS — K5900 Constipation, unspecified: Secondary | ICD-10-CM

## 2015-11-03 DIAGNOSIS — D701 Agranulocytosis secondary to cancer chemotherapy: Secondary | ICD-10-CM

## 2015-11-03 DIAGNOSIS — C563 Malignant neoplasm of bilateral ovaries: Secondary | ICD-10-CM

## 2015-11-03 DIAGNOSIS — K219 Gastro-esophageal reflux disease without esophagitis: Secondary | ICD-10-CM

## 2015-11-03 DIAGNOSIS — C5702 Malignant neoplasm of left fallopian tube: Secondary | ICD-10-CM

## 2015-11-03 DIAGNOSIS — G620 Drug-induced polyneuropathy: Secondary | ICD-10-CM

## 2015-11-03 DIAGNOSIS — Z95828 Presence of other vascular implants and grafts: Secondary | ICD-10-CM

## 2015-11-03 LAB — CBC WITH DIFFERENTIAL/PLATELET
BASO%: 0.7 % (ref 0.0–2.0)
BASOS ABS: 0.1 10*3/uL (ref 0.0–0.1)
EOS%: 0.4 % (ref 0.0–7.0)
Eosinophils Absolute: 0 10*3/uL (ref 0.0–0.5)
HCT: 35.4 % (ref 34.8–46.6)
HGB: 11.6 g/dL (ref 11.6–15.9)
LYMPH#: 1.2 10*3/uL (ref 0.9–3.3)
LYMPH%: 15.9 % (ref 14.0–49.7)
MCH: 33.5 pg (ref 25.1–34.0)
MCHC: 32.9 g/dL (ref 31.5–36.0)
MCV: 102 fL — ABNORMAL HIGH (ref 79.5–101.0)
MONO#: 0.6 10*3/uL (ref 0.1–0.9)
MONO%: 8 % (ref 0.0–14.0)
NEUT#: 5.9 10*3/uL (ref 1.5–6.5)
NEUT%: 75 % (ref 38.4–76.8)
Platelets: 97 10*3/uL — ABNORMAL LOW (ref 145–400)
RBC: 3.47 10*6/uL — AB (ref 3.70–5.45)
RDW: 15.9 % — ABNORMAL HIGH (ref 11.2–14.5)
WBC: 7.8 10*3/uL (ref 3.9–10.3)

## 2015-11-03 LAB — COMPREHENSIVE METABOLIC PANEL
ALT: 10 U/L (ref 0–55)
AST: 16 U/L (ref 5–34)
Albumin: 3.5 g/dL (ref 3.5–5.0)
Alkaline Phosphatase: 91 U/L (ref 40–150)
Anion Gap: 7 mEq/L (ref 3–11)
BUN: 7.2 mg/dL (ref 7.0–26.0)
CHLORIDE: 103 meq/L (ref 98–109)
CO2: 29 mEq/L (ref 22–29)
Calcium: 9.2 mg/dL (ref 8.4–10.4)
Creatinine: 0.7 mg/dL (ref 0.6–1.1)
EGFR: 88 mL/min/{1.73_m2} — AB (ref >90)
GLUCOSE: 89 mg/dL (ref 70–140)
POTASSIUM: 4.2 meq/L (ref 3.5–5.1)
SODIUM: 139 meq/L (ref 136–145)
Total Bilirubin: 0.47 mg/dL (ref 0.20–1.20)
Total Protein: 6.2 g/dL — ABNORMAL LOW (ref 6.4–8.3)

## 2015-11-03 LAB — UA PROTEIN, DIPSTICK - CHCC: Protein, ur: NEGATIVE mg/dL

## 2015-11-03 NOTE — Telephone Encounter (Signed)
lvm per LL note.

## 2015-11-03 NOTE — Telephone Encounter (Signed)
-----   Message from Gordy Levan, MD sent at 11/03/2015  3:31 PM EST ----- Labs seen and need follow up: please let her know no pulmonary edema or infection in lungs on CXR today, so those problems would not be cause of her SOB  (still need to let her know LE venous dopplers also)

## 2015-11-03 NOTE — Progress Notes (Signed)
OFFICE PROGRESS NOTE   November 05, 2015   Physicians:D. 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 She has now had 2 of planned additional 3 cycles for residual disease in area of spleen by CT (08-2015), but is having more difficult time with treatment. After discussion today, we will hold treatment, repeat CTs now, and have follow up with Dr Josephina Shih.  Patient had most recent taxol carbo and avastin on 10-21-15. As she has needed up to 6 days of granix to support counts, she was changed to neulasta with most recent treatment; unfortunately she had a much more diffult time with neulasta (tho ANC good today) including severe aches and temps 99.8 - 100.2 for ~ 4 days after. Patient states "I will never take neulasta again". She has been fatigued, including SOB for over a week, worse with exertion such as walking, but also at rest. She denies cough, chest pain, wheezing. She has had more swelling feet and ankles, without frank cords or tenderness LE. She denies palpitations. Blood pressures have been high despite multiple changes in antihypertensives by cardiology pharmacist. Patient feels that she is not tolerating present BP medications, tho we did discuss fact that changes in BP meds often cause some symptoms until more accustomed to them. She has some increased neuropathy in feet, including some of toes and metatarsal heads; she has no significant neuropathy in hands. Bowels have finally moved well today, for first time since treatment (chronic constipation preceded chemo, which as exacerbated this problem). She props on 2 pillows at night for post nasal drainage, which is irritating in throat; she cannot tell that claritin helps this. She is pushing at least 64  oz fluids daily, voids frequently including up several times thru night, but no dysuria. She has not been frankly orthostatic, as she was during adjuvant chemo. She has had no fever in past several days. No problems with PAC. Remainder of 10 point Review of Systems unchanged/ negative.    Flu vaccine 09-12-15 PAC placed by IR 04-21-15 Genetics testing BreastOvarian panel by [facility] sent 06-26-15: normal CA 125 on 05-01-15 33     ONCOLOGIC HISTORY Patient presented in late Jan 2014 with 2 weeks of spotting in early Dec 2015. Sonohystogram 01-05-13 showed uterus normal size and echotexture, endometrium 4.3 mm, left ovary normal and right adnexa with 1.1 x 8.4.x8.2 cm cystic and solid mass. Endometrial biopsy benign and CA 125 also on 01-05-13 was 178.8. CT AP 01-17-13 with 1.0 x 6.9 x 8.9 cm complex right ovarian mass, no ascites, small retroperitoneal nodes. She was seen by Dr Skeet Latch on 01-18-13 and taken to surgery by Dr Josephina Shih on 02-13-13, which was TAH/BSO/ omentectomy/ureterolysis/ resection of cul-de-sac tumor/right pelvic lymphadnectomy and resection of rectum with reanastomosis. At completion of surgery there was no gross residual disease. 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 Washings [phone]) had rare clusters of atypical cells. Chemotherapy with dose dense taxol carboplatin was begun day 1 cycle 1 on 03-20-13; ANC was 1.1 on day 15 cycle 1 with taxol given and neupogen added 04-04-13. She had day 1 cycle 2 treatment on 04-17-13, then  was briefly hospitalized 5-22 to 04-20-13 after syncopal episode, with antihypertensive agents DCd and UTI treated. She was feeling much better at time of "day 8" cycle 2 on 05-01-13 and did have neupogen 300  mcg x 1 dose on 05-02-13. She was readmitted to hospital 6-5 thru 05-05-13 with fever, empirically on antibiotics and blood cultures negative. Cycle 6 completed 08-14-13. CT AP 09-25-13 had no evidence of cancer and CA 125 was 18.3 on 07-24-2013. Marker was 20 in early Dec 2015, 26 on 01-31-15 and 35 on 03-14-15. PET 04-03-15 documented disease bilateral pelvic sidewalls, vaginal cuff and scattered areas thru abdomen/ pelvis, as well as area of uptake in spleen. She resumed carboplatin taxol using q 3 week regimen on 05-01-15. She was neutropenic with ANC 0.3 on day 9 despite beginning granix on day 5. She had 6 cycles of carbo taxol from 05-01-15 thru 08-25-15, with avastin cycles 2,3,4, 6. CT AP 09-10-15 showed resolution of involvement other than in spleen, which was improved. She saw Dr Josephina Shih on 09-12-15 , with discussion of splenectomy followed by avastin vs 3 additional cycles of chemo + avastin then reassess. Patient preferred additional treatment to splenectomy, with carbo taxol avastin resumed 09-29-15 and treated again 10-21-15. She tolerated treatment progressively more poorly, such that 1 planned additional cycle was held.   Objective:  Vital signs in last 24 hours:  BP 164/90 mmHg  Pulse 71  Temp(Src) 97.5 F (36.4 C) (Oral)  Resp 18  Ht 5' 3" (1.6 m)  Wt 137 lb 6.4 oz (62.324 kg)  BMI 24.35 kg/m2  SpO2 99% Initial BP 166/100 today.  Weight down 2 lbs. Alert, oriented and appropriate, talkative, respirations not labored RA with activity in exam room. Ambulatory without assistance. Not in acute discomfort. Alopecia  HEENT:PERRL, sclerae not icteric. Oral mucosa moist without lesions, posterior pharynx with dull erythema bilaterally consistent with post nasal drainage.  Neck supple. No JVD.  Lymphatics:no cervical,supraclavicular, axillary or inguinal adenopathy Resp: clear to auscultation bilaterally and normal percussion bilaterally Cardio: regular rate and rhythm. No gallop. GI:  soft, nontender, not distended, no mass or organomegaly. A few bowel sounds. Surgical incision not remarkable. Musculoskeletal/ Extremities: 1+ pedal edema bilaterally which is increased from baseline, without cords, tenderness Neuro: peripheral neuropathy as noted in feet. Otherwise nonfocal. PSYCH: appears frustrated, not anxious and not obviously depressed Skin without rash, ecchymosis, petechiae Portacath-without erythema or tenderness  Lab Results:  Results for orders placed or performed in visit on 11/03/15  CBC with Differential  Result Value Ref Range   WBC 7.8 3.9 - 10.3 10e3/uL   NEUT# 5.9 1.5 - 6.5 10e3/uL   HGB 11.6 11.6 - 15.9 g/dL   HCT 35.4 34.8 - 46.6 %   Platelets 97 (L) 145 - 400 10e3/uL   MCV 102.0 (H) 79.5 - 101.0 fL   MCH 33.5 25.1 - 34.0 pg   MCHC 32.9 31.5 - 36.0 g/dL   RBC 3.47 (L) 3.70 - 5.45 10e6/uL   RDW 15.9 (H) 11.2 - 14.5 %   lymph# 1.2 0.9 - 3.3 10e3/uL   MONO# 0.6 0.1 - 0.9 10e3/uL   Eosinophils Absolute 0.0 0.0 - 0.5 10e3/uL   Basophils Absolute 0.1 0.0 - 0.1 10e3/uL   NEUT% 75.0 38.4 - 76.8 %   LYMPH% 15.9 14.0 - 49.7 %   MONO% 8.0 0.0 - 14.0 %   EOS% 0.4 0.0 - 7.0 %   BASO% 0.7 0.0 - 2.0 %  Comprehensive metabolic panel  Result Value  Ref Range   Sodium 139 136 - 145 mEq/L   Potassium 4.2 3.5 - 5.1 mEq/L   Chloride 103 98 - 109 mEq/L   CO2 29 22 - 29 mEq/L   Glucose 89 70 - 140 mg/dl   BUN 7.2 7.0 - 26.0 mg/dL   Creatinine 0.7 0.6 - 1.1 mg/dL   Total Bilirubin 0.47 0.20 - 1.20 mg/dL   Alkaline Phosphatase 91 40 - 150 U/L   AST 16 5 - 34 U/L   ALT 10 0 - 55 U/L   Total Protein 6.2 (L) 6.4 - 8.3 g/dL   Albumin 3.5 3.5 - 5.0 g/dL   Calcium 9.2 8.4 - 10.4 mg/dL   Anion Gap 7 3 - 11 mEq/L   EGFR 88 (L) >90 ml/min/1.73 m2  Urine protein by dipstick - [facility]  Result Value Ref Range   Protein, ur Negative Negative- <30 mg/dL     Studies/Results: Patient sent from office for CXR and LE venous dopplers Dg Chest 2 View  11/03/2015  CLINICAL  DATA:  Bilateral leg swelling.  Ovarian cancer EXAM: CHEST  2 VIEW COMPARISON:  05/08/2015 FINDINGS: Heart size upper normal. Vascularity normal. Lungs are clear without infiltrate or effusion. No mass or lung nodule. Port-A-Cath tip in the SVC is unchanged. IMPRESSION: No active cardiopulmonary disease. Electronically Signed   By: Franchot Gallo M.D.   On: 11/03/2015 15:09   BIlateral LE venous dopplers negative for DVT 11-03-15   Medications: I have reviewed the patient's current medications. Continue antihypertensives per cardiology. Continue ASA and plavix now,repeat CBC 12-9 and may need to hold then if plt <= ~ 90k.  Note she continues estrogen daily, which she has refused to stop when I have broached this on several occasions previously. She has taken estrogen x years, begun for hot flashes  DISCUSSION Not tolerating Avastin, with hypertension poorly responsive to multiple medications over last several weeks by cardiologist and cardiology pharmacist. We very much appreciate their help, and hopefully the BP will gradually improve off of this drug, tho expect will need continued work with meds until then (I have generally seen several weeks to a few months for good improvement after avastin DCd). Not tolerating carbo taxol adequately, tho difficult to sort out specifics with whole situation.  Patient and husband are in agreement with cancelling planned chemo avastin next week. Patient is relieved that she may feel ok for Christmas. Does not want neulasta again, tho would use granix if necessary. Fortunately WBC/ ANC actually good today.  Will repeat CTs now. Dr Josephina Shih can see her in [facility] 11-07-[facility]. I will see her again on 12-19 with labs.  She will cut back on po fluids to ~ 48 oz daily if remains without orthostatic symptoms.  We will let her know results of CXR and LE venous dopplers. If increased SOB or other acute symptoms, should call and/ or go to  ED.  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 Progressive disease spring 2016, asymptomatic. Resumed chemotherapy with carboplatin and taxol using q 3 week dosing, cycle 1 on 05-01-15, addition of avastin with cycle 2, thru cycle 6 on 08-25-15. Restaging CT 08-2015 improved, with residual in spleen. Additional 2 of planned 3  cycles carbo taxol avastin given 09-29-15 and 10-21-15, more problems tolerating such that last treatment cancelled and will repeat scans now. To see Dr Josephina Shih on 11-07-15. 2.PAC  in, should be used and flushed with CT 3.HTN: significantly increased likely from avastin. Cardiology closely involved, expect will need to continue medications at least several weeks to a few months until improvement out from Avastin.  4.paroxysmal Afib: followed by Dr Ellyn Hack, next apt Jan. Platelets just under 100k today so continue plavix and ASA. Will recheck CBC at gyn onc appointment 11-07-15 in case platelets lower then. 5.chronic constipation requiring increased laxatives with chemo, up to date on colonoscopy.  6.hypothyroid on replacement by PCP 7. Left knee replacement 12-2014, doing well. Right hip replacement 2013 for osteoarthritis 8. Long estrogen replacement, which she still uses. She has previously refused to discontinue the estrogen. Initial surgical path from 02-13-13 does not report ER PR testing. 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 Breast tissue heterogeneously dense by last tomo mammograms at [facility] 03-21-15, those mammograms otherwise negative.  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 GERD: better with protonix, continue. 12. flu vaccine 09-12-15  All questions answered and patient, husband know to call prior to scheduled appointments if other concerns.  Time spent 40 min including >50% counseling and coordination of care. CC Drs Gilles Chiquito, cardiology pharmD Tommy Medal    Gordy Levan, MD   11/05/2015, 10:17 AM

## 2015-11-03 NOTE — Telephone Encounter (Signed)
Appointments made and avs printed for patient,contrast given and radiology will try to work her in

## 2015-11-03 NOTE — Progress Notes (Signed)
Preliminary results by tech - Venous Duplex Lower Ext. Completed. Negative for deep and superficial vein thrombosis in both legs.  Severin Bou, BS, RDMS, RVT  

## 2015-11-03 NOTE — Telephone Encounter (Signed)
Spoke with patient, she feels extremely fatigued with the switch to carvedilol and had no change in BP.  Would like to switch back to metoprolol.   Also would like to d/c the valsartan 320 mg as she feels it did nothing to bring her pressure down.   She went to oncologist office today, it was decided that she would d/c Avastin and chemo for now due to elevated pressure.  I told her to switch back to metoprolol tonight and she can d/c the valsartan, but if her pressure goes up she will need to re-start the valsartan.  Patient voiced understanding.  Also explained that the half life of Avastin is approx 20 days, so it may take 6-8 weeks from her last treatment until her BP comes back down to normal.  She is to call if she has problems, but suggested she contact me in the week after Christmas to let me know how her numbers are looking.

## 2015-11-03 NOTE — Telephone Encounter (Signed)
Please call asap regarding patient's blood pressure medication.

## 2015-11-05 ENCOUNTER — Other Ambulatory Visit: Payer: Self-pay | Admitting: Oncology

## 2015-11-05 DIAGNOSIS — R0602 Shortness of breath: Secondary | ICD-10-CM | POA: Insufficient documentation

## 2015-11-06 ENCOUNTER — Other Ambulatory Visit: Payer: Self-pay | Admitting: Cardiology

## 2015-11-06 ENCOUNTER — Encounter (HOSPITAL_COMMUNITY): Payer: Self-pay

## 2015-11-06 ENCOUNTER — Ambulatory Visit (HOSPITAL_COMMUNITY)
Admission: RE | Admit: 2015-11-06 | Discharge: 2015-11-06 | Disposition: A | Discharge status: Home / Self Care | Payer: Medicare Other | Source: Ambulatory Visit | Attending: Oncology | Admitting: Oncology

## 2015-11-06 DIAGNOSIS — D739 Disease of spleen, unspecified: Secondary | ICD-10-CM | POA: Insufficient documentation

## 2015-11-06 DIAGNOSIS — Z90710 Acquired absence of both cervix and uterus: Secondary | ICD-10-CM | POA: Insufficient documentation

## 2015-11-06 DIAGNOSIS — R590 Localized enlarged lymph nodes: Secondary | ICD-10-CM | POA: Diagnosis not present

## 2015-11-06 DIAGNOSIS — C569 Malignant neoplasm of unspecified ovary: Secondary | ICD-10-CM

## 2015-11-06 DIAGNOSIS — K449 Diaphragmatic hernia without obstruction or gangrene: Secondary | ICD-10-CM | POA: Diagnosis not present

## 2015-11-06 DIAGNOSIS — M419 Scoliosis, unspecified: Secondary | ICD-10-CM | POA: Diagnosis not present

## 2015-11-06 DIAGNOSIS — M7989 Other specified soft tissue disorders: Secondary | ICD-10-CM

## 2015-11-06 DIAGNOSIS — R0602 Shortness of breath: Secondary | ICD-10-CM

## 2015-11-06 DIAGNOSIS — I700 Atherosclerosis of aorta: Secondary | ICD-10-CM | POA: Insufficient documentation

## 2015-11-06 MED ORDER — METOPROLOL TARTRATE 50 MG PO TABS: 50.0000 mg | ORAL_TABLET | Freq: Two times a day (BID) | ORAL | Status: DC

## 2015-11-06 MED ORDER — IOHEXOL 300 MG/ML  SOLN
100.0000 mL | Freq: Once | INTRAMUSCULAR | Status: AC | PRN
  Administered 2015-11-06: 100 mL via INTRAVENOUS

## 2015-11-06 NOTE — Telephone Encounter (Signed)
°*  STAT* If patient is at the pharmacy, call can be transferred to refill team.   1. Which medications need to be refilled? (please list name of each medication and dose if known) Metoprolol-wants to change from 25 mg to 50 mg  2. Which pharmacy/location (including street and city if local pharmacy) is medication to be sent to?[phone]  3. Do they need a 30 day or 90 day supply? 180 and refills-if she goes to 50 mg-360 if it stays the same

## 2015-11-06 NOTE — Telephone Encounter (Signed)
Rx(s) sent to pharmacy electronically.  

## 2015-11-07 ENCOUNTER — Telehealth: Payer: Self-pay

## 2015-11-07 ENCOUNTER — Encounter: Payer: Self-pay | Admitting: Gynecology

## 2015-11-07 ENCOUNTER — Telehealth: Payer: Self-pay | Admitting: Oncology

## 2015-11-07 ENCOUNTER — Other Ambulatory Visit (HOSPITAL_BASED_OUTPATIENT_CLINIC_OR_DEPARTMENT_OTHER): Payer: Medicare Other

## 2015-11-07 ENCOUNTER — Other Ambulatory Visit: Payer: Self-pay | Admitting: Oncology

## 2015-11-07 ENCOUNTER — Ambulatory Visit: Payer: Medicare Other | Attending: Gynecology | Admitting: Gynecology

## 2015-11-07 ENCOUNTER — Other Ambulatory Visit: Payer: Self-pay | Admitting: *Deleted

## 2015-11-07 VITALS — BP 160/91 | HR 62 | Temp 97.6°F | Resp 18 | Ht 63.0 in | Wt 135.6 lb

## 2015-11-07 DIAGNOSIS — C569 Malignant neoplasm of unspecified ovary: Secondary | ICD-10-CM | POA: Diagnosis not present

## 2015-11-07 DIAGNOSIS — C7989 Secondary malignant neoplasm of other specified sites: Secondary | ICD-10-CM | POA: Insufficient documentation

## 2015-11-07 DIAGNOSIS — I10 Essential (primary) hypertension: Secondary | ICD-10-CM | POA: Diagnosis not present

## 2015-11-07 LAB — CBC WITH DIFFERENTIAL/PLATELET
BASO%: 1.1 % (ref 0.0–2.0)
Basophils Absolute: 0.1 10*3/uL (ref 0.0–0.1)
EOS ABS: 0 10*3/uL (ref 0.0–0.5)
EOS%: 0.5 % (ref 0.0–7.0)
HCT: 35.9 % (ref 34.8–46.6)
HEMOGLOBIN: 11.9 g/dL (ref 11.6–15.9)
LYMPH%: 23.5 % (ref 14.0–49.7)
MCH: 33.7 pg (ref 25.1–34.0)
MCHC: 33.3 g/dL (ref 31.5–36.0)
MCV: 101.4 fL — AB (ref 79.5–101.0)
MONO#: 0.5 10*3/uL (ref 0.1–0.9)
MONO%: 10.1 % (ref 0.0–14.0)
NEUT%: 64.8 % (ref 38.4–76.8)
NEUTROS ABS: 3.4 10*3/uL (ref 1.5–6.5)
PLATELETS: 172 10*3/uL (ref 145–400)
RBC: 3.54 10*6/uL — AB (ref 3.70–5.45)
RDW: 16.5 % — AB (ref 11.2–14.5)
WBC: 5.3 10*3/uL (ref 3.9–10.3)
lymph#: 1.2 10*3/uL (ref 0.9–3.3)

## 2015-11-07 NOTE — Telephone Encounter (Signed)
S/w pt per Dr Edwyna Shell message. POF sent to move appt to after PET.

## 2015-11-07 NOTE — Telephone Encounter (Signed)
-----   Message from Gordy Levan, MD sent at 11/07/2015 10:40 AM EST ----- Regarding: CBC 12-9 and LL 12-19 Please let her know blood counts better today, including platelets. Fine to continue plavix and ASA.  Please let her know we can cancel my appointment on 12-19 since labs better and as Dr Josephina Shih is getting PET on 12-20. If ok with patient to cancel 12-19  MD and lab, RN please sent POF to schedulers for that  Need to schedule next apt when appropriate after results of PET.  (Note PAC flush 12-8)  thanks

## 2015-11-07 NOTE — Patient Instructions (Signed)
Plan to take a break from treatment until the new year. We will call you with an appointment for a PET scan. Please call our office with any additional questions or concerns.

## 2015-11-07 NOTE — Progress Notes (Signed)
Consult Note: Gyn-Onc   [name] 74 y.o. female  No chief complaint on file.   Assessment and plan :   Stage IIb poorly differentiated ovarian cancer currently responding to therapy with carboplatin , Taxol, and a Avastin. Poorly controlled hypertension.  The most recent CT scan is reviewed with the patient and her husband. It appears that there is continued but slight decrease size of the metastasis in the spleen. On the most recent report there is also some question about adenopathy and resolution of possible peritoneal implants in the pelvis. This is a different interpretation than what I had seen in the prior CT scan report and raises the question as to whether the patient might have other metastatic disease. Therefore, we'll obtain a PET scan to further evaluate whether the lesion in the spleen is solitary or whether there are other metastatic sites. Clearly if there are other metastatic sites I would not recommend splenectomy. But, rather, would consider changing chemotherapy to another regimen.  We will schedule the PET scan and allow the patient have a therapeutic holiday over the Christmas holidays. She has been very happy to have time off of chemotherapy.  Interval History:   The patient returns today as previously scheduled.  Since her last visit she's received 2 additional cycles of carboplatin, Taxol, and a Avastin. We had planned 3 cycles but the third is been held because the patient's hypertension. Further, the patient reports that after each cycle she's becoming more more fatigued and more nausea and quality of life has diminished considerably.  She had a CT scan to reassess her disease status on December 8. This noted that the lesion in the spleen has had "mild" decrease in size. Currently the lesion measures 3.5 x 3.2 cm (previously 3.5 x 3.9 cm. In addition, the radiologist reports that peritoneal implants within the pelvis are no longer measurable and that there is  resolution of previously enlarged right external iliac node. In addition there is a right hilar node which is borderline enlarged and increased in size when compared to scan of May 2014. Patient's most recent CA-125 is 14 (previously 12) Otherwise she has no abdominal or pelvic symptoms.  HPI:The patient initially presented with a pelvic mass and elevated CA 125 (178 units per mL) She underwent exploratory laparotomy and debulking on 02/13/2013. Final pathology showed a poorly differentiated ovarian cancer involving both ovaries pelvic peritoneum and rectal muscularis. All gross disease was resected.  She then received 6 cycles of carboplatin and Taxol chemotherapy completed in September 2014. At the completion of chemotherapy her CA 125 was 18 units per mL.   In May 2016 the patient CA-125 began to rise and a PET CT scan showed metastatic disease in the spleen and pelvic lymph nodes. Given the long platinum free interval , we retreated with carboplatin, Taxol, and a Avastin. She received 6 cycles of carboplatin and Taxol and a Avastin the last being administered in September 2016. She had a significant response with CA 125 falling to 12 units per mL in September. CT scan showed persistent disease in the spleen (which was actually responding). Patient received a total 8 cycles and on follow-up in December 2016 had persistent disease in the spleen which had slightly decreased in size. At that time CA-125 is 14 units per mL.   Review of Systems:10 point review of systems is negative except as noted in interval history.   Vitals: Blood pressure 160/91, pulse 62, temperature 97.6 F (36.4 C), temperature  source Oral, resp. rate 18, height 5\' 3"  (1.6 m), weight 135 lb 9.6 oz (61.508 kg), SpO2 100 %.  Physical Exam: General : The patient is a healthy woman in no acute distress.  HEENT: normocephalic, extraoccular movements normal; neck is supple without thyromegally  Lynphnodes: Supraclavicular and  inguinal nodes not enlarged  Abdomen: Soft, non-tender, no ascites, no organomegally, no masses, no hernias , midline incision is healing well  Pelvic:    EGBUS: Normal female  Vagina bladder urethra: Normal  Vaginal cuff is well healed.  Cervix and uterus are surgically absent  Bimanual exam: No masses nodularity or fullness.  Rectovaginal exam confirms      Lower extremities: No edema or varicosities. Normal range of motion      Allergies  Allergen Reactions  . Ceftin [Cefuroxime Axetil]     Interferes with propafenone.    . Codeine Other (See Comments)    Does not like the feeling she gets  . Flexeril [Cyclobenzaprine] Other (See Comments)    Pt states "increased heart rate"  . Levaquin [Levofloxacin In D5w]     Interferes with propafenone.   . Lisinopril     LIP NUMBNESS  . Tegaderm Ag Mesh [Silver]   . Tape Rash    TEGADERM.   (use opsite on PAC)  . Ultram [Tramadol] Palpitations    Past Medical History  Diagnosis Date  . Hypertension   . Dyslipidemia   . Hypothyroidism   . Arthritis     OSTEOARTHRITIS   -- CONSTANT PAIN RIGHT HIP---AND PAIN LEFT KNEE--PT STATES SHE GETS INJECTIONS INTO HER KNEE  . PAF (paroxysmal atrial fibrillation) (HCC)     Only on Plavix -- not full Anticoagulation per pt. request  . Complication of anesthesia     BLOOD PRESSURE DROPPED WITH NASAL SURGERY, ONE OF THE CARPAL TUNNEL REPAIRS AND DURING A COLONOSCOPY  . History of skin cancer   . Constipation   . Ovarian cancer ([place]) 01/2013    Recurrence since 2014/2016    Past Surgical History  Procedure Laterality Date  . Bilateral carpal tunnel repair  2007  . Dilation and curettage of uterus  1969  . Surgery for ruptured ovarian cyst  1969  . Rhinoplasty for fractured nose  1986  . Left knee arthroscopy   2011  . Total hip arthroplasty  02/25/2012    Procedure: TOTAL HIP ARTHROPLASTY ANTERIOR APPROACH;  Surgeon: Mcarthur Rossetti, MD;  Location: [facility];  Service:  Orthopedics;  Laterality: Right;  . Tonsillectomy  1962  . Laparotomy Bilateral 02/13/2013    Procedure: EXPLORATORY LAPAROTOMY TOTAL ABDOMINAL HYSTERECTOMY BILATERAL SALPINGO-OOPHORECTOMY, Partial Rectal Resection with Reanastamosis;  Surgeon: Alvino Chapel, MD;  Location: [facility];  Service: Gynecology;  Laterality: Bilateral;  . Omentectomy  02/13/2013    Procedure: OMENTECTOMY;  Surgeon: Alvino Chapel, MD;  Location: [facility];  Service: Gynecology;;  . Lymphadenectomy Right 02/13/2013    Procedure: PEVLIC  LYMPHADENECTOMY, DEBULKING right pelvic tumor nodules;  Surgeon: Alvino Chapel, MD;  Location: [facility];  Service: Gynecology;  Laterality: Right;  . Total abdominal hysterectomy  01/2013  . Transthoracic echocardiogram  May 2014    Normal LV size function. EF 60-65%. Grade 1 diastolic function. Mild MR and mildly elevated PA pressures of 37 mmHg per  . Nm myoview ltd  June 2010     subbmaximal with no ischemia or infarction.  . Total knee arthroplasty Left 01/03/2015    Procedure: LEFT TOTAL KNEE ARTHROPLASTY;  Surgeon:  Mcarthur Rossetti, MD;  Location: [facility];  Service: Orthopedics;  Laterality: Left;    Current Outpatient Prescriptions  Medication Sig Dispense Refill  . amoxicillin (AMOXIL) 500 MG capsule Piror to dental procedures    . aspirin EC 81 MG tablet Take 81 mg by mouth every morning.     . bisacodyl (DULCOLAX) 5 MG EC tablet Take 5 mg by mouth daily as needed for moderate constipation.    . carvedilol (COREG) 12.5 MG tablet Take 1 tablet (12.5 mg total) by mouth 2 (two) times daily. 60 tablet 3  . cloNIDine (CATAPRES) 0.1 MG tablet Take 1 tablet (0.1 mg total) by mouth 2 (two) times daily. 60 tablet 2  . clopidogrel (PLAVIX) 75 MG tablet TAKE 1 TABLET BY MOUTH AT BEDTIME 90 tablet 3  . desonide (DESOWEN) 0.05 % cream Apply 1 application topically 2 (two) times daily as needed (rash).   1  . dexamethasone (DECADRON) 4 MG tablet Take 5 tabs with food  12 hrs and 6 hrs prior to taxol. Then take 1 tab with food every morning for the next 4 days. 28 tablet 0  . diphenhydrAMINE (SOMINEX) 25 MG tablet Take 25-50 mg by mouth at bedtime as needed for sleep.    Marland Kitchen docusate sodium (COLACE) 100 MG capsule Take 200 mg by mouth 2 (two) times daily.     Marland Kitchen estradiol (ESTRACE) 1 MG tablet Take 1 tablet (1 mg total) by mouth every morning. PT STATES SHE WANTS ESTRACE--NOT THE GENERIC 90 tablet 3  . Glucosamine HCl (GLUCOSAMINE PO) Take 1,000 mg by mouth every morning.     Marland Kitchen levothyroxine (SYNTHROID, LEVOTHROID) 88 MCG tablet Take 88 mcg by mouth every morning. Takes on an empty stomach. PT STATES SHE NEEDS THE SYNTHROID--DOES NOT WANT THE GENERIC    . lidocaine-prilocaine (EMLA) cream Apply to Porta-Cath site 1-2 hours prior to access as directed. 30 g 2  . loratadine (CLARITIN) 10 MG tablet Take 10 mg by mouth daily as needed for allergies.    Marland Kitchen LORazepam (ATIVAN) 1 MG tablet Place 1/2 -1 tablet under the tongue or swallow every 6 hrs as needed for nausea. Will make drowsy. 20 tablet 0  . magnesium hydroxide (MILK OF MAGNESIA) 800 MG/5ML suspension Take 30 mLs by mouth daily as needed for constipation.    . Melatonin 5 MG CAPS Take 1 capsule by mouth at bedtime as needed.    . Menthol, Topical Analgesic, (ICY HOT EX) Apply 1 application topically daily as needed (pain.).    Marland Kitchen metoprolol tartrate (LOPRESSOR) 50 MG tablet Take 1 tablet (50 mg total) by mouth 2 (two) times daily. 180 tablet 2  . Multiple Vitamin (MULTIVITAMIN WITH MINERALS) TABS tablet Take 1 tablet by mouth every morning.     . ondansetron (ZOFRAN) 8 MG tablet Take 1 tablet (8 mg total) by mouth every 8 (eight) hours as needed for nausea or vomiting (Will not make drowsy). 30 tablet 1  . pantoprazole (PROTONIX) 40 MG tablet Take 1 tablet (40 mg total) by mouth daily. 90 tablet 1  . Polyethyl Glycol-Propyl Glycol (SYSTANE OP) Place 1 drop into both eyes 2 (two) times daily.     . polyethylene  glycol (MIRALAX / GLYCOLAX) packet Take 17 g by mouth every morning.     . potassium chloride (K-DUR) 10 MEQ tablet Take 10 mEq by mouth every Monday, Wednesday, and Friday.    . propafenone (RYTHMOL) 225 MG tablet TAKE 1 TABLET (225 MG TOTAL) BY  MOUTH 2 (TWO) TIMES DAILY. 180 tablet 1  . valsartan (DIOVAN) 320 MG tablet Take 1 tablet (320 mg total) by mouth daily. 30 tablet 3   No current facility-administered medications for this visit.    Social History   Social History  . Marital Status: Married    Spouse Name: N/A  . Number of Children: N/A  . Years of Education: N/A   Occupational History  . Not on file.   Social History Main Topics  . Smoking status: Never Smoker   . Smokeless tobacco: Never Used  . Alcohol Use: No  . Drug Use: No  . Sexual Activity: Not Currently   Other Topics Concern  . Not on file   Social History Narrative   Married mother of 3, grandmother 64.   Exercises 6/7 days a week walking 30 minutes a time.   Never smoked or drank alcohol.    Family History  Problem Relation Age of Onset  . Hypertension Mother   . Basal cell carcinoma Mother   . AAA (abdominal aortic aneurysm) Father   . Heart Problems Maternal Uncle   . Heart Problems Maternal Grandfather   . AAA (abdominal aortic aneurysm) Paternal Grandmother   . Prostate cancer Maternal Uncle 70  . Prostate cancer Other   . Other Daughter     one daughter had TAH-BSO at 73; other daughter will have one soon      Alvino Chapel, MD 11/07/2015, 8:49 AM

## 2015-11-07 NOTE — Telephone Encounter (Signed)
s.w. pt and advised on 12.19 appt cx and moved to 12.22 after PET

## 2015-11-07 NOTE — Telephone Encounter (Signed)
S/w pt that PET is 12/20, arrive 730 , NPO after midnight. Pt wrote down instructions.

## 2015-11-10 ENCOUNTER — Ambulatory Visit: Payer: Medicare Other

## 2015-11-11 ENCOUNTER — Telehealth: Payer: Self-pay

## 2015-11-11 NOTE — Telephone Encounter (Signed)
Pt called to let us know her PCP Virgina Jock is sending an Rx for labs to be drawn at [facility] at her next lab appt at [facility]. Which will be 12/22. Called pt back and left voice message to let her know to bring the paper rx with her to her lab appt. And then the results will be sent directly to dr Virgina Jock. He is wanting TSH and free T4 for hypothyroid evaluation.

## 2015-11-12 ENCOUNTER — Other Ambulatory Visit: Payer: Self-pay | Admitting: Oncology

## 2015-11-16 ENCOUNTER — Other Ambulatory Visit: Payer: Self-pay | Admitting: Oncology

## 2015-11-16 DIAGNOSIS — C561 Malignant neoplasm of right ovary: Secondary | ICD-10-CM

## 2015-11-16 DIAGNOSIS — C562 Malignant neoplasm of left ovary: Secondary | ICD-10-CM

## 2015-11-16 DIAGNOSIS — C7989 Secondary malignant neoplasm of other specified sites: Secondary | ICD-10-CM

## 2015-11-16 DIAGNOSIS — C563 Malignant neoplasm of bilateral ovaries: Secondary | ICD-10-CM

## 2015-11-17 ENCOUNTER — Ambulatory Visit: Payer: Medicare Other | Admitting: Oncology

## 2015-11-17 ENCOUNTER — Other Ambulatory Visit: Payer: Medicare Other

## 2015-11-18 ENCOUNTER — Ambulatory Visit (HOSPITAL_COMMUNITY)
Admission: RE | Admit: 2015-11-18 | Discharge: 2015-11-18 | Disposition: A | Discharge status: Home / Self Care | Payer: Medicare Other | Source: Ambulatory Visit | Attending: Gynecology | Admitting: Gynecology

## 2015-11-18 DIAGNOSIS — I2510 Atherosclerotic heart disease of native coronary artery without angina pectoris: Secondary | ICD-10-CM | POA: Insufficient documentation

## 2015-11-18 DIAGNOSIS — C7989 Secondary malignant neoplasm of other specified sites: Secondary | ICD-10-CM

## 2015-11-18 DIAGNOSIS — K449 Diaphragmatic hernia without obstruction or gangrene: Secondary | ICD-10-CM | POA: Insufficient documentation

## 2015-11-18 DIAGNOSIS — R938 Abnormal findings on diagnostic imaging of other specified body structures: Secondary | ICD-10-CM | POA: Diagnosis not present

## 2015-11-18 DIAGNOSIS — I700 Atherosclerosis of aorta: Secondary | ICD-10-CM | POA: Diagnosis not present

## 2015-11-18 DIAGNOSIS — C569 Malignant neoplasm of unspecified ovary: Secondary | ICD-10-CM | POA: Insufficient documentation

## 2015-11-18 DIAGNOSIS — J8410 Pulmonary fibrosis, unspecified: Secondary | ICD-10-CM | POA: Insufficient documentation

## 2015-11-18 LAB — GLUCOSE, CAPILLARY: GLUCOSE-CAPILLARY: 98 mg/dL (ref 65–99)

## 2015-11-18 MED ORDER — FLUDEOXYGLUCOSE F - 18 (FDG) INJECTION
6.4300 | Freq: Once | INTRAVENOUS | Status: AC | PRN
  Administered 2015-11-18: 6.43 via INTRAVENOUS

## 2015-11-20 ENCOUNTER — Other Ambulatory Visit (HOSPITAL_BASED_OUTPATIENT_CLINIC_OR_DEPARTMENT_OTHER): Payer: Medicare Other

## 2015-11-20 ENCOUNTER — Telehealth: Payer: Self-pay | Admitting: Oncology

## 2015-11-20 ENCOUNTER — Ambulatory Visit (HOSPITAL_BASED_OUTPATIENT_CLINIC_OR_DEPARTMENT_OTHER): Payer: Medicare Other | Admitting: Oncology

## 2015-11-20 ENCOUNTER — Encounter: Payer: Self-pay | Admitting: Oncology

## 2015-11-20 VITALS — BP 172/83 | HR 63 | Temp 97.5°F | Resp 18 | Ht 63.0 in | Wt 135.0 lb

## 2015-11-20 DIAGNOSIS — Z95828 Presence of other vascular implants and grafts: Secondary | ICD-10-CM

## 2015-11-20 DIAGNOSIS — C563 Malignant neoplasm of bilateral ovaries: Secondary | ICD-10-CM

## 2015-11-20 DIAGNOSIS — C7989 Secondary malignant neoplasm of other specified sites: Secondary | ICD-10-CM

## 2015-11-20 DIAGNOSIS — I158 Other secondary hypertension: Secondary | ICD-10-CM

## 2015-11-20 DIAGNOSIS — C561 Malignant neoplasm of right ovary: Secondary | ICD-10-CM | POA: Diagnosis not present

## 2015-11-20 DIAGNOSIS — R42 Dizziness and giddiness: Secondary | ICD-10-CM

## 2015-11-20 DIAGNOSIS — G620 Drug-induced polyneuropathy: Secondary | ICD-10-CM

## 2015-11-20 DIAGNOSIS — C5702 Malignant neoplasm of left fallopian tube: Secondary | ICD-10-CM

## 2015-11-20 DIAGNOSIS — C5701 Malignant neoplasm of right fallopian tube: Secondary | ICD-10-CM

## 2015-11-20 DIAGNOSIS — C562 Malignant neoplasm of left ovary: Secondary | ICD-10-CM | POA: Diagnosis not present

## 2015-11-20 DIAGNOSIS — K219 Gastro-esophageal reflux disease without esophagitis: Secondary | ICD-10-CM

## 2015-11-20 DIAGNOSIS — Z8679 Personal history of other diseases of the circulatory system: Secondary | ICD-10-CM

## 2015-11-20 DIAGNOSIS — C569 Malignant neoplasm of unspecified ovary: Secondary | ICD-10-CM

## 2015-11-20 DIAGNOSIS — T50905A Adverse effect of unspecified drugs, medicaments and biological substances, initial encounter: Secondary | ICD-10-CM

## 2015-11-20 DIAGNOSIS — I10 Essential (primary) hypertension: Secondary | ICD-10-CM

## 2015-11-20 LAB — CBC WITH DIFFERENTIAL/PLATELET
BASO%: 0.3 % (ref 0.0–2.0)
BASOS ABS: 0 10*3/uL (ref 0.0–0.1)
EOS ABS: 0.1 10*3/uL (ref 0.0–0.5)
EOS%: 1.2 % (ref 0.0–7.0)
HCT: 38.1 % (ref 34.8–46.6)
HEMOGLOBIN: 12.5 g/dL (ref 11.6–15.9)
LYMPH%: 29.6 % (ref 14.0–49.7)
MCH: 33.8 pg (ref 25.1–34.0)
MCHC: 32.9 g/dL (ref 31.5–36.0)
MCV: 102.9 fL — AB (ref 79.5–101.0)
MONO#: 0.6 10*3/uL (ref 0.1–0.9)
MONO%: 12.2 % (ref 0.0–14.0)
NEUT%: 56.7 % (ref 38.4–76.8)
NEUTROS ABS: 2.7 10*3/uL (ref 1.5–6.5)
Platelets: 120 10*3/uL — ABNORMAL LOW (ref 145–400)
RBC: 3.7 10*6/uL (ref 3.70–5.45)
RDW: 17.2 % — AB (ref 11.2–14.5)
WBC: 4.8 10*3/uL (ref 3.9–10.3)
lymph#: 1.4 10*3/uL (ref 0.9–3.3)

## 2015-11-20 LAB — CA 125: CA 125: 17 U/mL (ref <35)

## 2015-11-20 NOTE — Telephone Encounter (Signed)
Appointments made and avs printed for patient °

## 2015-11-20 NOTE — Patient Instructions (Addendum)
Use meclizine when you get home; let Dr Marko Plume know if this resolves the dizziness (can cancel head CT if completely resolves)  Go to ED if dizziness worse prior to getting CT head

## 2015-11-20 NOTE — Progress Notes (Signed)
OFFICE PROGRESS NOTE   November 21, 2015   Physicians:D. 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 Hypertension has been exacerbated by Avastin.   Patient had CT CAP 11-06-15, then PET 11-18-15, with disease apparent in region of splenic hilum between stomach and spleen, and a 1 cm right deep pelvic sidewall node. That node had not been identified on most recent prior scan due to orthopedic hardware in right hip. She saw Dr Josephina Shih after CT and before PET, with recommendation to change regimen after holidays if sites other than spleen.  Patient is having difficulty tolerating mutliple antihypertensives tried by cardiology and also last week by Dr Virgina Jock, tho blood pressures have been a little lower. She is mostly fatigued apparently from these medications, but did have SOB and pedal edema with one of the attempted regimens. She follows BP closely at home and is in regular contact with cardiology pharmD. She is to see Dr Ellyn Hack 12-15-14 and Dr Virgina Jock in March.   She awakened this AM with acute dizziness that happened as she turned over in bed and has persisted. She feels "head is spinning" but cannot tell that this is positional now. She has tried meclizine for some dizziness in past, tho not clear if this was positional vertigo. She had nausea with the dizziness, took antiemetic (I believe ativan). She denies HA or other new neurologic symptoms. She is on ASA and plavix for paroxysmal A fib.  Review of Systems otherwise: no fever, no recent infectious illness, no present symptoms of infection. Large ecchymosis dorsum right hand recently, nearly resolved. No bleeding. Appetite ok. Chronic constipation ongoing, no BM now in 2  days but may not have taken all of usual laxatives. No abdominal pain. No SOB now. Some nausea with vertigo symptoms this AM, but otherwise not much nausea. Peripheral neuropathy in feet stable. Able to walk into office today. Bladder ok. Slept last pm. Remainder of 10 point Review of Systems unchanged.   Flu vaccine 09-12-15 PAC flushed 11-06-15 Genetics testing BreastOvarian panel by [facility] sent 06-26-15: normal CA 125 on 05-01-15 33   ONCOLOGIC HISTORY  Patient presented in late Jan 2014 with 2 weeks of spotting in early Dec 2015. Sonohystogram 01-05-13 showed uterus normal size and echotexture, endometrium 4.3 mm, left ovary normal and right adnexa with 1.1 x 8.4.x8.2 cm cystic and solid mass. Endometrial biopsy benign and CA 125 also on 01-05-13 was 178.8. CT AP 01-17-13 with 1.0 x 6.9 x 8.9 cm complex right ovarian mass, no ascites, small retroperitoneal nodes. She was seen by Dr Skeet Latch on 01-18-13 and taken to surgery by Dr Josephina Shih on 02-13-13, which was TAH/BSO/ omentectomy/ureterolysis/ resection of cul-de-sac tumor/right pelvic lymphadnectomy and resection of rectum with reanastomosis. At completion of surgery there was no gross residual disease. 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 Wash[facility] [phone]) had rare clusters of atypical cells. Chemotherapy with dose dense taxol carboplatin was begun day 1 cycle 1 on 03-20-13; ANC was 1.1 on day 15 cycle 1 with taxol given and neupogen added 04-04-13. She had day 1 cycle 2 treatment on  04-17-13, then was briefly hospitalized 5-22 to 04-20-13 after syncopal episode, with antihypertensive agents DCd and UTI treated. She was feeling much better at time of "day 8" cycle 2 on 05-01-13 and did have  neupogen 300 mcg x 1 dose on 05-02-13. She was readmitted to hospital 6-5 thru 05-05-13 with fever, empirically on antibiotics and blood cultures negative. Cycle 6 completed 08-14-13. CT AP 09-25-13 had no evidence of cancer and CA 125 was 18.3 on 07-24-2013. Marker was 20 in early Dec 2015, 26 on 01-31-15 and 35 on 03-14-15. PET 04-03-15 documented disease bilateral pelvic sidewalls, vaginal cuff and scattered areas thru abdomen/ pelvis, as well as area of uptake in spleen. She resumed carboplatin taxol using q 3 week regimen on 05-01-15. She was neutropenic with ANC 0.3 on day 9 despite beginning granix on day 5. She had 6 cycles of carbo taxol from 05-01-15 thru 08-25-15, with avastin cycles 2,3,4, 6. CT AP 09-10-15 showed resolution of involvement other than in spleen, which was improved. She saw Dr Josephina Shih on 09-12-15 , with discussion of splenectomy followed by avastin vs 3 additional cycles of chemo + avastin then reassess. Patient preferred additional treatment to splenectomy, with carbo taxol avastin resumed 09-29-15 and treated again 10-21-15. She tolerated treatment progressively more poorly, such that 1 planned additional cycle was held. CT CAP 11-06-15 and PET 11-18-15 showed persistent disease in area of splenic hilum (between stomach and spleen) and at 1 cm right pelvic sidewall node.  .  Objective:  Vital signs in last 24 hours:  BP 172/83 mmHg  Pulse 63  Temp(Src) 97.5 F (36.4 C) (Oral)  Resp 18  Ht 5\' 3"  (1.6 m)  Wt 135 lb (61.236 kg)  BMI 23.92 kg/m2  SpO2 100% Weight down 2 lbs. Alert, oriented and appropriate tho mentation a little slower possibly with ativan (?) this AM.  Ambulatory with cane. .  Alopecia  HEENT:PERRL, sclerae not icteric. Oral mucosa moist without lesions, posterior pharynx clear.  Neck supple. No JVD.  Lymphatics:no cervical,supraclavicular adenopathy Resp: clear to auscultation bilaterally  Cardio: regular rate and rhythm. No gallop. GI: soft, nontender,  slightly distended, no apparent mass or organomegaly. Minimal scattered BS. Surgical incision not remarkable. Musculoskeletal/ Extremities: without pitting edema, cords, tenderness Neuro:  peripheral neuropathy feet as previously. Speech fluent. CN appear intact, no nystagmus.  Moves all extremities equally. Repositions easily in exam room chair. No tremors Skin resolving ecchymosis dorsum hand, otherwise without rash, petechiae Portacath-without erythema or tenderness  Lab Results:  Results for orders placed or performed in visit on 11/20/15  CBC with Differential  Result Value Ref Range   WBC 4.8 3.9 - 10.3 10e3/uL   NEUT# 2.7 1.5 - 6.5 10e3/uL   HGB 12.5 11.6 - 15.9 g/dL   HCT 38.1 34.8 - 46.6 %   Platelets 120 (L) 145 - 400 10e3/uL   MCV 102.9 (H) 79.5 - 101.0 fL   MCH 33.8 25.1 - 34.0 pg   MCHC 32.9 31.5 - 36.0 g/dL   RBC 3.70 3.70 - 5.45 10e6/uL   RDW 17.2 (H) 11.2 - 14.5 %   lymph# 1.4 0.9 - 3.3 10e3/uL   MONO# 0.6 0.1 - 0.9 10e3/uL   Eosinophils Absolute 0.1 0.0 - 0.5 10e3/uL   Basophils Absolute 0.0 0.0 - 0.1 10e3/uL   NEUT% 56.7 38.4 - 76.8 %   LYMPH% 29.6 14.0 - 49.7 %   MONO% 12.2 0.0 - 14.0 %   EOS% 1.2 0.0 - 7.0 %  BASO% 0.3 0.0 - 2.0 %  CA 125  Result Value Ref Range   CA 125 17 <35 U/mL    CBC discussed, CA 125 available after visit  Studies/Results:  EXAM: CT CHEST, ABDOMEN, AND PELVIS WITH CONTRAST  COMPARISON: 04/03/2015  FINDINGS: CT CHEST FINDINGS  Mediastinum/Lymph Nodes: Normal heart size. No pericardial effusion. Prominent right hilar lymph node measures 11 mm, image 25 of series 2. Previously 5 mm. No enlarged mediastinal or left hilar lymph nodes. No pericardial effusion.Nodule containing clip is identified within the periphery of the left breast, image 29 of series 2.  Lungs/Pleura: No suspicious pulmonary nodule or mass identified. No pleural fluid identified.  Musculoskeletal: There is a scoliosis deformity involving  the thoracic spine. Multi level degenerative disc disease noted.  CT ABDOMEN PELVIS FINDINGS  Hepatobiliary: No suspicious liver abnormalities identified. The gallbladder appears normal. There is no biliary dilatation.  Pancreas: No mass, inflammatory changes, or other significant abnormality.  Spleen: Arising from the medial spleen there is a mass measuring 3.5 x 3.2 cm, image 53 of series 2. This is compared with 3.5 x 3.9 cm previously. The mass appears to extend into the posterior wall of the gastric fundus, image 53 of series 2.  Adrenals/Urinary Tract: The adrenal glands are normal. Normal appearance of the kidneys. No hydronephrosis. Urinary bladder appears normal.  Stomach/Bowel: Small hiatal hernia. The stomach and the small bowel loops have a normal course and caliber. No obstruction. Moderate stool burden identified throughout the colon. No pathologic dilatation of the large bowel loops.  Vascular/Lymphatic: Calcified atherosclerotic disease involves the abdominal aorta. No aneurysm. No upper abdominal adenopathy identified. Resolution of previous enlarged right external iliac node.  Reproductive: Previous hysterectomy. No adnexal mass noted.  Other: Peritoneal implants within the pelvis are no longer measurable. No new areas of peritoneal disease identified.  Musculoskeletal: No suspicious bone lesions identified.  IMPRESSION: 1. Mass arising from the medial spleen and involving the posterior wall of stomach demonstrates mild decrease in size in the interval. 2. Interval improvement and previously referenced pathologic iliac lymph nodes. 3. Previously referenced peritoneal implants within the pelvis are no longer measurable. No new areas of peritoneal metastasis is identified. 4. There is a right hilar node which is borderline enlarged but is increased in size when compared with 04/19/2013. Attention on follow-up examination is  recommended.       EXAM: NUCLEAR MEDICINE PET SKULL BASE TO THIGH   11-18-15  COMPARISON: 04/03/2015 PET-CT.  FINDINGS: NECK  No hypermetabolic lymph nodes in the neck. No appreciable change in diffuse thyroid hypermetabolism asymmetrically involving the right thyroid lobe (max SUV 7.5, previously 7.1), without discrete thyroid nodule, most suggestive of diffuse thyroiditis.  CHEST  No hypermetabolic axillary, mediastinal or hilar nodes. The borderline enlarged right hilar node described on the 11/06/2015 CT study is non hypermetabolic.  Right internal jugular MediPort terminates at the cavoatrial junction. Left anterior descending and right coronary atherosclerosis. Calcified subcentimeter granuloma in the right middle lobe. No acute consolidative airspace disease or significant pulmonary nodules.  ABDOMEN/PELVIS  There is a 2.8 x 2.5 cm hypermetabolic tumor implant near the splenic hilum between the stomach and spleen (series 4/image 94) with max SUV 10.6, decreased in size and metabolism from 3.9 x 3.7 cm with max SUV of 11.9 on 04/03/2015 PET-CT and slightly decreased in size from 3.0 x 2.7 cm on 11/06/2015.  There is a 1.0 cm hypermetabolic node in the right deep pelvic side wall (series 4/image 154) with  max SUV 6.3, decreased in size and metabolism from 1.6 cm with max SUV 9.7 on 04/03/2015 PET-CT, and which was obscured by streak artifact on the 11/06/2015 CT study. There are no additional residual hypermetabolic tumor implants in the peritoneal cavity. There are no additional hypermetabolic lymph nodes in the abdomen or pelvis.  No abnormal hypermetabolic activity within the liver, pancreas, adrenal glands, or spleen. Small hiatal hernia. Atherosclerotic nonaneurysmal abdominal aorta.  SKELETON  No focal hypermetabolic activity to suggest skeletal metastasis.  IMPRESSION: 1. Residual hypermetabolic tumor implant between the stomach  and spleen, significantly decreased in size and mildly decreased in metabolism compared to 04/03/2015 PET-CT, and slightly decreased in size since 11/06/2015. 2. Residual hypermetabolic right deep pelvic side wall lymph node, with partial treatment response compared to 04/03/2015 PET-CT. 3. No new sites of hypermetabolic metastatic disease. 4. Stable diffuse thyroid hypermetabolism without discrete thyroid nodule, most suggestive of diffuse thyroiditis. Recommend correlation with serum thyroid function tests.    PACS images reviewed at visit.   Medications: I have reviewed the patient's current medications.  DISCUSSION: PET results discussed. Patient understands that we have reached maximum benefit from the taxol carbo avastin, and likewise agrees that she cannot tolerate further avastin now. She also agrees that she is not tolerating antihypertensives adequately to begin other chemo immediately, tho fortunately extent and location of the gyn malignancy does not require urgent resumption of chemo.   I have mentioned in general other options for treatment, which include oral etoposide, gemzar, CDDP gemzar, cytoxan, Alimta, topotecan; doxil not a first choice due to other cardiac concerns.  She did not have ER PR testing on original pathology, tho was on estradiol at that time and has continued this daily (has refused to consider DC). Following visit, I have requested ER PR testing be done on surgical path [id number] from 02-13-13 ([facility]).   With disease limited to area of splenic hilum and the right pelvic sidewall node, and given poor tolerance of chemotherapy, I wonder if radiation might be a consideration. Did not discuss radiation with patient / husband at visit.  WIll let Dr Josephina Shih know results of PET for his recommendations also.  I will see patient back in Jan after cardiology follow up, to decide next treatment plan then.   With new vertigo, I have ordered  CT head. Patient will take meclizine today and can let me know if this resolves the dizziness, in which case CT head can be cancelled. If symptoms worsen prior to CT, she should call and likely needs to be seen at ED.     Assessment/Plan: 1.Recurrent ovarian carcinoma: IIB poorly differentiated serous involving bilateral ovaries and tubes at optimal radical debulking 02-13-13, completed adjuvant dose dense carboplatin taxol 08-14-2013. Progressive disease spring 2016, asymptomatic. Resumed chemotherapy with carboplatin and taxol on 05-01-15, addition of avastin with cycle 2.  Restaging CT 08-2015 after 6 cycles improved, with residual in spleen. Additional 2 cycles carbo taxol avastin given 09-29-15 and 10-21-15, tolerated poorly including hypertension. CT CAP and PET 10-2015 residual disease in region of splenic hilum and 1 right pelvic node. Expect either change in chemo or possibly hormonal manipulation or ? Radiation in Jan. I will see her back after cardiology in Jan. 2.acute onset vertigo this AM: benign positional vertigo possible, but risk factors for intracerebral problems so have requested head CT. See plan above. 3.HTN: BP increased for last several weeks likely from avastin. Cardiology closely involved and addressed also by Dr Virgina Jock,  expect  will need to continue medications even several months until improvement out from Avastin.  4.paroxysmal Afib: followed by cardiology, on ASA and plavix 5.mild thrombocytopenia lower than last counts, could be medication related. Follow, note on ASA and plavix. 6.chronic constipation, required increased laxatives with chemo, up to date on colonoscopy.  7..hypothyroid on replacement by PCP. Diffuse uptake left lobe thyroid by scans, per radiologist consistent with thyroiditis. I will send scan reports to Dr Virgina Jock. 8. Long estrogen replacement, which she still uses, previously refused to discontiue.. Pathology to test surgical path from 02-13-13 for ER PR  (tho was on same estrogen at that time). 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 Breast tissue heterogeneously dense by last tomo mammograms at [facility] 03-21-15, those mammograms otherwise negative.  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 11. GERD: better with protonix, continue. 12. flu vaccine 09-12-15 13.PAC flushed 11-06-15 14 Chronic balance problems, which she mentions again now. We had previously discussed referral to outpatient balance PT  15.Left knee replacement 12-2014, doing well. Right hip replacement 2013 for osteoarthritis    All questions answered and they know to call if concerns prior to next scheduled visit. Time spent 40 min including >50% counseling and coordination of care. Cc Drs Josephina Shih, Bettina Gavia, MD   11/21/2015, 9:50 AM

## 2015-11-21 ENCOUNTER — Other Ambulatory Visit: Payer: Self-pay | Admitting: Oncology

## 2015-11-25 ENCOUNTER — Other Ambulatory Visit: Payer: Self-pay | Admitting: Cardiology

## 2015-11-25 ENCOUNTER — Telehealth: Payer: Self-pay | Admitting: Pharmacist Clinician (PhC)/ Clinical Pharmacy Specialist

## 2015-11-25 NOTE — Telephone Encounter (Signed)
Pt called to report BP readings.  Lower, but still elevated, with last 4 days between 164-175/78-93.  This is improved, as previously she would have readings as high as 190 regularly.  Started spironolactone 12.5 mg on Dec 19, was told to increase to 25 mg daily if BP still elevated.  Wanted to know if she could stop valsartan.    Advised that she continue with her valsartan, and increase spironolactone to 25 mg, as she is still not to BP goal. Explained that she needs to see regular readings <150 before we could consider decreasing or stopping any medication.  Advised that it may take 2-3 months after stopping Avastin for her BP to normalize.  Pt voiced understanding.

## 2015-11-26 ENCOUNTER — Ambulatory Visit (HOSPITAL_COMMUNITY)
Admission: RE | Admit: 2015-11-26 | Discharge: 2015-11-26 | Disposition: A | Discharge status: Home / Self Care | Payer: Medicare Other | Source: Ambulatory Visit | Attending: Oncology | Admitting: Oncology

## 2015-11-26 ENCOUNTER — Other Ambulatory Visit: Payer: Self-pay | Admitting: Oncology

## 2015-11-26 ENCOUNTER — Telehealth: Payer: Self-pay

## 2015-11-26 ENCOUNTER — Encounter (HOSPITAL_COMMUNITY): Payer: Self-pay

## 2015-11-26 DIAGNOSIS — Z8679 Personal history of other diseases of the circulatory system: Secondary | ICD-10-CM

## 2015-11-26 DIAGNOSIS — R42 Dizziness and giddiness: Secondary | ICD-10-CM | POA: Diagnosis present

## 2015-11-26 DIAGNOSIS — C563 Malignant neoplasm of bilateral ovaries: Secondary | ICD-10-CM

## 2015-11-26 DIAGNOSIS — I10 Essential (primary) hypertension: Secondary | ICD-10-CM | POA: Insufficient documentation

## 2015-11-26 DIAGNOSIS — C562 Malignant neoplasm of left ovary: Secondary | ICD-10-CM

## 2015-11-26 DIAGNOSIS — C561 Malignant neoplasm of right ovary: Secondary | ICD-10-CM | POA: Diagnosis not present

## 2015-11-26 DIAGNOSIS — C569 Malignant neoplasm of unspecified ovary: Secondary | ICD-10-CM | POA: Diagnosis present

## 2015-11-26 DIAGNOSIS — G319 Degenerative disease of nervous system, unspecified: Secondary | ICD-10-CM | POA: Insufficient documentation

## 2015-11-26 MED ORDER — IOHEXOL 300 MG/ML  SOLN
75.0000 mL | Freq: Once | INTRAMUSCULAR | Status: AC | PRN
  Administered 2015-11-26: 75 mL via INTRAVENOUS

## 2015-11-26 NOTE — Telephone Encounter (Signed)
Told [name] the results of the Ct head as noted below by Dr. Marko Plume. [name] states that the dizziness has stopped.  She feels it is the new BP med she is on.  Her body maybe beginning to adjust to it.  She has bee on it for ~ a week.

## 2015-11-26 NOTE — Telephone Encounter (Signed)
-----   Message from Gordy Levan, MD sent at 11/26/2015 10:18 AM EST ----- Labs seen and need follow up: please let her know no stroke or other acute problem on this CT head. If dizziness is still a problem, she should be seen by either PCP or ENT, as it may be positional vertigo thanks

## 2015-11-27 ENCOUNTER — Other Ambulatory Visit: Payer: Self-pay | Admitting: Oncology

## 2015-11-27 ENCOUNTER — Telehealth: Payer: Self-pay | Admitting: Oncology

## 2015-11-27 DIAGNOSIS — C561 Malignant neoplasm of right ovary: Secondary | ICD-10-CM

## 2015-11-27 DIAGNOSIS — C563 Malignant neoplasm of bilateral ovaries: Secondary | ICD-10-CM

## 2015-11-27 DIAGNOSIS — C562 Malignant neoplasm of left ovary: Secondary | ICD-10-CM

## 2015-11-27 DIAGNOSIS — C7989 Secondary malignant neoplasm of other specified sites: Secondary | ICD-10-CM

## 2015-11-27 DIAGNOSIS — C7889 Secondary malignant neoplasm of other digestive organs: Secondary | ICD-10-CM

## 2015-11-27 NOTE — Telephone Encounter (Signed)
Medical Oncology  Dr Josephina Shih reviewed restaging information and patient status. He does not feel surgery would be appropriate given involvement in pelvic node as well as spleen. He feels that radiation to those areas would be reasonable to consider.  I spoke with patient by phone now. Dizziness has resolved and BP a little better this week, still tired. She would be interested in talking with radiation oncology, does not want more chemo any sooner than necessary.  New patient consultation requested with radiation oncology.  She will keep apts with cardiology and med onc in Jan as scheduled.  Patient appreciated call.  Godfrey Pick, MD

## 2015-11-30 DIAGNOSIS — I639 Cerebral infarction, unspecified: Secondary | ICD-10-CM

## 2015-11-30 HISTORY — DX: Cerebral infarction, unspecified: I63.9

## 2015-12-02 ENCOUNTER — Telehealth: Payer: Self-pay | Admitting: *Deleted

## 2015-12-02 NOTE — Telephone Encounter (Signed)
VM message received @ 11:46 am requesting CA 125 results from 11/20/15  TCT patient with results. No further needs identified.

## 2015-12-03 ENCOUNTER — Inpatient Hospital Stay (HOSPITAL_COMMUNITY)
Admission: EM | Admit: 2015-12-03 | Discharge: 2015-12-09 | DRG: 280 | Disposition: A | Discharge status: Home / Self Care | Payer: Medicare Other | Attending: Internal Medicine | Admitting: Internal Medicine

## 2015-12-03 ENCOUNTER — Inpatient Hospital Stay (HOSPITAL_COMMUNITY): Payer: Medicare Other

## 2015-12-03 ENCOUNTER — Encounter (HOSPITAL_COMMUNITY): Payer: Self-pay | Admitting: Emergency Medicine

## 2015-12-03 DIAGNOSIS — R7989 Other specified abnormal findings of blood chemistry: Secondary | ICD-10-CM | POA: Diagnosis not present

## 2015-12-03 DIAGNOSIS — I169 Hypertensive crisis, unspecified: Secondary | ICD-10-CM | POA: Diagnosis present

## 2015-12-03 DIAGNOSIS — K5909 Other constipation: Secondary | ICD-10-CM | POA: Diagnosis present

## 2015-12-03 DIAGNOSIS — R079 Chest pain, unspecified: Secondary | ICD-10-CM | POA: Insufficient documentation

## 2015-12-03 DIAGNOSIS — R402252 Coma scale, best verbal response, oriented, at arrival to emergency department: Secondary | ICD-10-CM | POA: Diagnosis present

## 2015-12-03 DIAGNOSIS — Z96641 Presence of right artificial hip joint: Secondary | ICD-10-CM | POA: Diagnosis present

## 2015-12-03 DIAGNOSIS — I480 Paroxysmal atrial fibrillation: Secondary | ICD-10-CM | POA: Diagnosis present

## 2015-12-03 DIAGNOSIS — I6340 Cerebral infarction due to embolism of unspecified cerebral artery: Secondary | ICD-10-CM | POA: Diagnosis not present

## 2015-12-03 DIAGNOSIS — E785 Hyperlipidemia, unspecified: Secondary | ICD-10-CM | POA: Diagnosis present

## 2015-12-03 DIAGNOSIS — R509 Fever, unspecified: Secondary | ICD-10-CM | POA: Diagnosis not present

## 2015-12-03 DIAGNOSIS — Z96652 Presence of left artificial knee joint: Secondary | ICD-10-CM | POA: Diagnosis present

## 2015-12-03 DIAGNOSIS — E860 Dehydration: Secondary | ICD-10-CM | POA: Diagnosis present

## 2015-12-03 DIAGNOSIS — C7989 Secondary malignant neoplasm of other specified sites: Secondary | ICD-10-CM | POA: Diagnosis present

## 2015-12-03 DIAGNOSIS — T451X5A Adverse effect of antineoplastic and immunosuppressive drugs, initial encounter: Secondary | ICD-10-CM | POA: Diagnosis present

## 2015-12-03 DIAGNOSIS — I639 Cerebral infarction, unspecified: Secondary | ICD-10-CM | POA: Diagnosis not present

## 2015-12-03 DIAGNOSIS — K5900 Constipation, unspecified: Secondary | ICD-10-CM | POA: Diagnosis present

## 2015-12-03 DIAGNOSIS — Z8679 Personal history of other diseases of the circulatory system: Secondary | ICD-10-CM | POA: Diagnosis not present

## 2015-12-03 DIAGNOSIS — H532 Diplopia: Secondary | ICD-10-CM | POA: Diagnosis not present

## 2015-12-03 DIAGNOSIS — I63531 Cerebral infarction due to unspecified occlusion or stenosis of right posterior cerebral artery: Secondary | ICD-10-CM | POA: Diagnosis not present

## 2015-12-03 DIAGNOSIS — I6330 Cerebral infarction due to thrombosis of unspecified cerebral artery: Secondary | ICD-10-CM | POA: Insufficient documentation

## 2015-12-03 DIAGNOSIS — I25119 Atherosclerotic heart disease of native coronary artery with unspecified angina pectoris: Secondary | ICD-10-CM | POA: Diagnosis present

## 2015-12-03 DIAGNOSIS — I10 Essential (primary) hypertension: Secondary | ICD-10-CM | POA: Diagnosis present

## 2015-12-03 DIAGNOSIS — E039 Hypothyroidism, unspecified: Secondary | ICD-10-CM | POA: Diagnosis present

## 2015-12-03 DIAGNOSIS — Z7901 Long term (current) use of anticoagulants: Secondary | ICD-10-CM | POA: Diagnosis not present

## 2015-12-03 DIAGNOSIS — Z7982 Long term (current) use of aspirin: Secondary | ICD-10-CM

## 2015-12-03 DIAGNOSIS — I249 Acute ischemic heart disease, unspecified: Secondary | ICD-10-CM | POA: Diagnosis present

## 2015-12-03 DIAGNOSIS — I161 Hypertensive emergency: Secondary | ICD-10-CM | POA: Diagnosis present

## 2015-12-03 DIAGNOSIS — Z85828 Personal history of other malignant neoplasm of skin: Secondary | ICD-10-CM

## 2015-12-03 DIAGNOSIS — I214 Non-ST elevation (NSTEMI) myocardial infarction: Principal | ICD-10-CM | POA: Diagnosis present

## 2015-12-03 DIAGNOSIS — I2510 Atherosclerotic heart disease of native coronary artery without angina pectoris: Secondary | ICD-10-CM | POA: Diagnosis not present

## 2015-12-03 DIAGNOSIS — R519 Headache, unspecified: Secondary | ICD-10-CM

## 2015-12-03 DIAGNOSIS — R441 Visual hallucinations: Secondary | ICD-10-CM | POA: Diagnosis not present

## 2015-12-03 DIAGNOSIS — D6959 Other secondary thrombocytopenia: Secondary | ICD-10-CM | POA: Diagnosis present

## 2015-12-03 DIAGNOSIS — D61810 Antineoplastic chemotherapy induced pancytopenia: Secondary | ICD-10-CM | POA: Diagnosis present

## 2015-12-03 DIAGNOSIS — E876 Hypokalemia: Secondary | ICD-10-CM | POA: Diagnosis present

## 2015-12-03 DIAGNOSIS — Z8543 Personal history of malignant neoplasm of ovary: Secondary | ICD-10-CM | POA: Diagnosis not present

## 2015-12-03 DIAGNOSIS — C7951 Secondary malignant neoplasm of bone: Secondary | ICD-10-CM | POA: Diagnosis present

## 2015-12-03 DIAGNOSIS — C569 Malignant neoplasm of unspecified ovary: Secondary | ICD-10-CM | POA: Diagnosis present

## 2015-12-03 DIAGNOSIS — I160 Hypertensive urgency: Secondary | ICD-10-CM | POA: Diagnosis present

## 2015-12-03 DIAGNOSIS — I6789 Other cerebrovascular disease: Secondary | ICD-10-CM | POA: Diagnosis not present

## 2015-12-03 DIAGNOSIS — I158 Other secondary hypertension: Secondary | ICD-10-CM | POA: Diagnosis present

## 2015-12-03 DIAGNOSIS — R778 Other specified abnormalities of plasma proteins: Secondary | ICD-10-CM

## 2015-12-03 DIAGNOSIS — R402362 Coma scale, best motor response, obeys commands, at arrival to emergency department: Secondary | ICD-10-CM | POA: Diagnosis present

## 2015-12-03 DIAGNOSIS — C7889 Secondary malignant neoplasm of other digestive organs: Secondary | ICD-10-CM | POA: Diagnosis present

## 2015-12-03 DIAGNOSIS — H539 Unspecified visual disturbance: Secondary | ICD-10-CM | POA: Insufficient documentation

## 2015-12-03 DIAGNOSIS — T50905A Adverse effect of unspecified drugs, medicaments and biological substances, initial encounter: Secondary | ICD-10-CM | POA: Diagnosis present

## 2015-12-03 DIAGNOSIS — R51 Headache: Secondary | ICD-10-CM

## 2015-12-03 DIAGNOSIS — R402142 Coma scale, eyes open, spontaneous, at arrival to emergency department: Secondary | ICD-10-CM | POA: Diagnosis present

## 2015-12-03 LAB — I-STAT TROPONIN, ED: Troponin i, poc: 0.26 ng/mL (ref 0.00–0.08)

## 2015-12-03 LAB — CBC WITH DIFFERENTIAL/PLATELET
BASOS ABS: 0 10*3/uL (ref 0.0–0.1)
Basophils Relative: 1 %
EOS PCT: 1 %
Eosinophils Absolute: 0 10*3/uL (ref 0.0–0.7)
HEMATOCRIT: 37.6 % (ref 36.0–46.0)
Hemoglobin: 12.6 g/dL (ref 12.0–15.0)
LYMPHS PCT: 26 %
Lymphs Abs: 1.4 10*3/uL (ref 0.7–4.0)
MCH: 34.1 pg — ABNORMAL HIGH (ref 26.0–34.0)
MCHC: 33.5 g/dL (ref 30.0–36.0)
MCV: 101.6 fL — AB (ref 78.0–100.0)
MONO ABS: 0.6 10*3/uL (ref 0.1–1.0)
MONOS PCT: 11 %
NEUTROS ABS: 3.3 10*3/uL (ref 1.7–7.7)
Neutrophils Relative %: 61 %
PLATELETS: 132 10*3/uL — AB (ref 150–400)
RBC: 3.7 MIL/uL — ABNORMAL LOW (ref 3.87–5.11)
RDW: 14.7 % (ref 11.5–15.5)
WBC: 5.3 10*3/uL (ref 4.0–10.5)

## 2015-12-03 LAB — BASIC METABOLIC PANEL
ANION GAP: 7 (ref 5–15)
BUN: 10 mg/dL (ref 6–20)
CALCIUM: 9.3 mg/dL (ref 8.9–10.3)
CO2: 28 mmol/L (ref 22–32)
CREATININE: 0.49 mg/dL (ref 0.44–1.00)
Chloride: 101 mmol/L (ref 101–111)
GFR calc Af Amer: >60 mL/min (ref >60)
GLUCOSE: 105 mg/dL — AB (ref 65–99)
Potassium: 3.7 mmol/L (ref 3.5–5.1)
Sodium: 136 mmol/L (ref 135–145)

## 2015-12-03 LAB — TROPONIN I: Troponin I: 0.24 ng/mL — ABNORMAL HIGH (ref <0.031)

## 2015-12-03 NOTE — ED Notes (Signed)
Patient complained of left scapular pain/12 lead repeated, Lana primary RN aware.  IV therapist at bedside to access patient's port

## 2015-12-03 NOTE — ED Notes (Signed)
Notified EDP,Allen,MD., pt. i-stat troponin results 0.26 and RN,Lan made aware.

## 2015-12-03 NOTE — ED Provider Notes (Signed)
CSN: [id number]     Arrival date & time 12/03/15  2038 History   First MD Initiated Contact with Patient 12/03/15 2109     Chief Complaint  Patient presents with  . Chest Pain  . Hypertension   HPI  [name] is a 75 year old female with PMHx of HTN, ovarian cancer, PAF on plavix and arthritis presenting with HTN and chest pain. Pt reports onset of chest pain approximately 2 hours PTA. She was working in her kitchen when she noted an aching across her anterior chest. The pain did not radiate. She states that it gradually worsened before resolving while she was in triage. Total pain episode approximately 2 hours. The chest pain was not associated with SOB, diaphoresis, dizziness, syncope or nausea. She took her blood pressure at home during the episode and noted it was 210/100. She is currently being treated for recurrent ovarian cancer. Her chemotherapy medications increased her blood pressure and she is currently trying new medications to keep her blood pressure low enough to restart chemo. She notes that the chest pain felt similar to her a fib but she did not experience palpitations with it. She reports full resolution of chest pain currently. Denies fevers, chills, headaches, dizziness, syncope, neck pain, abdominal pain, nausea, vomiting, diarrhea, weakness, or numbness.   Past Medical History  Diagnosis Date  . Hypertension   . Dyslipidemia   . Hypothyroidism   . Arthritis     OSTEOARTHRITIS   -- CONSTANT PAIN RIGHT HIP---AND PAIN LEFT KNEE--PT STATES SHE GETS INJECTIONS INTO HER KNEE  . PAF (paroxysmal atrial fibrillation) (HCC)     Only on Plavix -- not full Anticoagulation per pt. request  . Complication of anesthesia     BLOOD PRESSURE DROPPED WITH NASAL SURGERY, ONE OF THE CARPAL TUNNEL REPAIRS AND DURING A COLONOSCOPY  . History of skin cancer   . Constipation   . Ovarian cancer ([place]) 01/2013    Recurrence since 2014/2016   Past Surgical History  Procedure Laterality Date   . Bilateral carpal tunnel repair  2007  . Dilation and curettage of uterus  1969  . Surgery for ruptured ovarian cyst  1969  . Rhinoplasty for fractured nose  1986  . Left knee arthroscopy   2011  . Total hip arthroplasty  02/25/2012    Procedure: TOTAL HIP ARTHROPLASTY ANTERIOR APPROACH;  Surgeon: Mcarthur Rossetti, MD;  Location: [facility];  Service: Orthopedics;  Laterality: Right;  . Tonsillectomy  1962  . Laparotomy Bilateral 02/13/2013    Procedure: EXPLORATORY LAPAROTOMY TOTAL ABDOMINAL HYSTERECTOMY BILATERAL SALPINGO-OOPHORECTOMY, Partial Rectal Resection with Reanastamosis;  Surgeon: Alvino Chapel, MD;  Location: [facility];  Service: Gynecology;  Laterality: Bilateral;  . Omentectomy  02/13/2013    Procedure: OMENTECTOMY;  Surgeon: Alvino Chapel, MD;  Location: [facility];  Service: Gynecology;;  . Lymphadenectomy Right 02/13/2013    Procedure: PEVLIC  LYMPHADENECTOMY, DEBULKING right pelvic tumor nodules;  Surgeon: Alvino Chapel, MD;  Location: [facility];  Service: Gynecology;  Laterality: Right;  . Total abdominal hysterectomy  01/2013  . Transthoracic echocardiogram  May 2014    Normal LV size function. EF 60-65%. Grade 1 diastolic function. Mild MR and mildly elevated PA pressures of 37 mmHg per  . Nm myoview ltd  June 2010     subbmaximal with no ischemia or infarction.  . Total knee arthroplasty Left 01/03/2015    Procedure: LEFT TOTAL KNEE ARTHROPLASTY;  Surgeon: Mcarthur Rossetti, MD;  Location: [facility];  Service: Orthopedics;  Laterality: Left;   Family History  Problem Relation Age of Onset  . Hypertension Mother   . Basal cell carcinoma Mother   . AAA (abdominal aortic aneurysm) Father   . Heart Problems Maternal Uncle   . Heart Problems Maternal Grandfather   . AAA (abdominal aortic aneurysm) Paternal Grandmother   . Prostate cancer Maternal Uncle 70  . Prostate cancer Other   . Other Daughter     one daughter had TAH-BSO at 88; other  daughter will have one soon   Social History  Substance Use Topics  . Smoking status: Never Smoker   . Smokeless tobacco: Never Used  . Alcohol Use: No   OB History    Gravida Para Term Preterm AB TAB SAB Ectopic Multiple Living   3 2   1  1  1 3      Review of Systems  Cardiovascular: Positive for chest pain.  All other systems reviewed and are negative.     Allergies  Coreg; Ceftin; Clonidine derivatives; Codeine; Flexeril; Levaquin; Lisinopril; Tegaderm ag mesh; Tape; and Ultram  Home Medications   Prior to Admission medications   Medication Sig Start Date End Date Taking? Authorizing Provider  aspirin EC 81 MG tablet Take 81 mg by mouth every morning.    Yes Historical Provider, MD  clopidogrel (PLAVIX) 75 MG tablet TAKE 1 TABLET BY MOUTH AT BEDTIME 03/14/15  Yes Leonie Man, MD  desonide (DESOWEN) 0.05 % cream Apply 1 application topically 2 (two) times daily.  12/23/14  Yes Historical Provider, MD  diphenhydrAMINE (SOMINEX) 25 MG tablet Take 25-50 mg by mouth at bedtime as needed for sleep.   Yes Historical Provider, MD  docusate sodium (COLACE) 100 MG capsule Take 200 mg by mouth 2 (two) times daily.    Yes Historical Provider, MD  estradiol (ESTRACE) 1 MG tablet Take 1 tablet (1 mg total) by mouth every morning. PT STATES SHE WANTS ESTRACE--NOT THE GENERIC 03/28/15  Yes Marti Sleigh, MD  Glucosamine HCl (GLUCOSAMINE PO) Take 1,000 mg by mouth every morning.    Yes Historical Provider, MD  levothyroxine (SYNTHROID, LEVOTHROID) 88 MCG tablet Take 88 mcg by mouth every morning. Takes on an empty stomach. PT STATES SHE NEEDS THE SYNTHROID--DOES NOT WANT THE GENERIC   Yes Historical Provider, MD  lidocaine-prilocaine (EMLA) cream Apply to Porta-Cath site 1-2 hours prior to access as directed. 04/22/15  Yes Lennis Marion Downer, MD  Melatonin 5 MG CAPS Take 1 capsule by mouth at bedtime as needed (sleep).    Yes Historical Provider, MD  Menthol, Topical Analgesic, (ICY HOT  EX) Apply 1 application topically daily as needed (pain.).   Yes Historical Provider, MD  metoprolol tartrate (LOPRESSOR) 50 MG tablet Take 1 tablet (50 mg total) by mouth 2 (two) times daily. 11/06/15  Yes Leonie Man, MD  Multiple Vitamin (MULTIVITAMIN WITH MINERALS) TABS tablet Take 1 tablet by mouth every morning.    Yes Historical Provider, MD  ondansetron (ZOFRAN) 8 MG tablet Take 1 tablet (8 mg total) by mouth every 8 (eight) hours as needed for nausea or vomiting (Will not make drowsy). 07/11/15  Yes Lennis Marion Downer, MD  pantoprazole (PROTONIX) 40 MG tablet Take 1 tablet (40 mg total) by mouth daily. 10/28/15  Yes Leonie Man, MD  Polyethyl Glycol-Propyl Glycol (SYSTANE OP) Place 1 drop into both eyes 2 (two) times daily.    Yes Historical Provider, MD  polyethylene glycol (MIRALAX / GLYCOLAX)  packet Take 17 g by mouth every morning.    Yes Historical Provider, MD  potassium chloride (K-DUR) 10 MEQ tablet Take 10 mEq by mouth every Monday, Wednesday, and Friday. 05/05/13  Yes Shon Baton, MD  propafenone (RYTHMOL) 225 MG tablet TAKE 1 TABLET (225 MG TOTAL) BY MOUTH 2 (TWO) TIMES DAILY. 09/15/15  Yes Leonie Man, MD  spironolactone (ALDACTONE) 25 MG tablet Take 25 mg by mouth daily.  11/20/15  Yes Historical Provider, MD  valsartan (DIOVAN) 320 MG tablet TAKE 1 TABLET (320 MG TOTAL) BY MOUTH DAILY. 11/25/15  Yes Leonie Man, MD  amoxicillin (AMOXIL) 500 MG capsule Reported on 11/20/2015 02/26/15   Historical Provider, MD  bisacodyl (DULCOLAX) 5 MG EC tablet Take 5 mg by mouth daily as needed for moderate constipation. Reported on 11/20/2015    Historical Provider, MD  dexamethasone (DECADRON) 4 MG tablet Take 5 tabs with food 12 hrs and 6 hrs prior to taxol. Then take 1 tab with food every morning for the next 4 days. Patient not taking: Reported on 11/20/2015 10/13/15   Gordy Levan, MD  loratadine (CLARITIN) 10 MG tablet Take 10 mg by mouth daily as needed for allergies.  Reported on 11/20/2015    Historical Provider, MD  LORazepam (ATIVAN) 1 MG tablet Place 1/2 -1 tablet under the tongue or swallow every 6 hrs as needed for nausea. Will make drowsy. 08/14/15   Lennis Marion Downer, MD  magnesium hydroxide (MILK OF MAGNESIA) 800 MG/5ML suspension Take 30 mLs by mouth daily as needed for constipation. Reported on 11/20/2015    Historical Provider, MD   BP 165/99 mmHg  Pulse 62  Temp(Src) 97.8 F (36.6 C) (Oral)  Resp 16  SpO2 100% Physical Exam  Constitutional: She is oriented to person, place, and time. She appears well-developed and well-nourished. No distress.  HENT:  Head: Normocephalic and atraumatic.  Eyes: Conjunctivae are normal. Right eye exhibits no discharge. Left eye exhibits no discharge. No scleral icterus.  Neck: Normal range of motion.  Cardiovascular: Normal rate, regular rhythm and normal heart sounds.   Pulmonary/Chest: Effort normal and breath sounds normal. No respiratory distress. She has no wheezes. She has no rales.  Abdominal: Soft. There is no tenderness.  Musculoskeletal: Normal range of motion.  Neurological: She is alert and oriented to person, place, and time. She has normal strength. No cranial nerve deficit or sensory deficit. Coordination normal. GCS eye subscore is 4. GCS verbal subscore is 5. GCS motor subscore is 6.  Skin: Skin is warm and dry.  Psychiatric: She has a normal mood and affect. Her behavior is normal.  Nursing note and vitals reviewed.   ED Course  Procedures (including critical care time) Labs Review Labs Reviewed  CBC WITH DIFFERENTIAL/PLATELET - Abnormal; Notable for the following:    RBC 3.70 (*)    MCV 101.6 (*)    MCH 34.1 (*)    Platelets 132 (*)    All other components within normal limits  BASIC METABOLIC PANEL - Abnormal; Notable for the following:    Glucose, Bld 105 (*)    All other components within normal limits  TROPONIN I - Abnormal; Notable for the following:    Troponin I 0.24 (*)     All other components within normal limits  I-STAT TROPOININ, ED - Abnormal; Notable for the following:    Troponin i, poc 0.26 (*)    All other components within normal limits  BRAIN NATRIURETIC PEPTIDE  BASIC METABOLIC  PANEL  LIPID PANEL  CBC  TROPONIN I  TROPONIN I  TROPONIN I    Imaging Review Dg Chest 2 View  12/04/2015  CLINICAL DATA:  Sudden onset of mid chest pain this evening. EXAM: CHEST  2 VIEW COMPARISON:  Chest radiograph 11/03/2015, chest CT 11/06/2015 FINDINGS: Tip of the right chest port in the mid SVC. The cardiomediastinal contours are normal. Pulmonary vasculature is normal. No consolidation, pleural effusion, or pneumothorax. No acute osseous abnormalities are seen. Degenerative change seen in the thoracic spine. IMPRESSION: No acute pulmonary process. Electronically Signed   By: Jeb Levering M.D.   On: 12/04/2015 00:41   I have personally reviewed and evaluated these images and lab results as part of my medical decision-making.   EKG Interpretation   Date/Time:  Wednesday December 03 2015 23:33:36 EST Ventricular Rate:  63 PR Interval:  219 QRS Duration: 102 QT Interval:  433 QTC Calculation: 443 R Axis:   58 Text Interpretation:  Sinus rhythm Borderline prolonged PR interval No  significant change since last tracing Confirmed by ALLEN  MD, ANTHONY  ([id number]) on 12/04/2015 12:43:32 AM      MDM   Final diagnoses:  Chest pain, unspecified chest pain type  Elevated troponin   75 year old female presenting with chest pain x 2 hours and elevated BP to 210/100. Chest pain resolved in triage and BP subsequently came down to 136/73. Pt is non-toxic appearing in NAD. Heart RRR without new murmur. Lungs CTAB. Abdomen soft, non-tender. Non-focal neuro exam. pt reports feeling at her baseline. Troponin elevated to 0.26. Consulted cardiology and spoke with Dr. Brandon Melnick. He reviewed patient's chart, vitals, labs, ECGs and imaging. Questions hypertensive urgency  with troponin elevation vs ACS. He recommends hospital admit with cycled troponins. Cardiology will follow the pt tomorrow. Consulted hospitalist, Dr. Myna Hidalgo, who will admit the patient to stepdown.      Josephina Gip, PA-C 12/04/15 [id number]

## 2015-12-03 NOTE — ED Provider Notes (Signed)
Medical screening examination/treatment/procedure(s) were conducted as a shared visit with non-physician practitioner(s) and myself.  I personally evaluated the patient during the encounter.   EKG Interpretation   Date/Time:  Wednesday December 03 2015 21:47:25 EST Ventricular Rate:  60 PR Interval:  216 QRS Duration: 101 QT Interval:  427 QTC Calculation: 427 R Axis:   62 Text Interpretation:  Sinus rhythm Borderline prolonged PR interval  Consider left atrial enlargement inferior lateral st depression new when  compared to 2007 Confirmed by Chaun Uemura  MD, Charnise Lovan ([id number]) on 12/03/2015  9:56:40 PM     Patient here after having chest pain with light exertion which began prior to arrival and lasting for possibly 2 hours. Patient's EKG does show some new inferior lateral ST depression on and is elevated at 0.26. Will heparinize per pharmacy protocol. She is pain-free at this time. Will consult cardiology and admit  Lacretia Leigh, MD 12/03/15 2222

## 2015-12-03 NOTE — ED Notes (Signed)
Pt states tonight around 7pm she started having chest pain  Pt states the pain is in the front of her chest   Pt states she takes chemo for ovarian cancer last treatment was November 22nd  Pt states the chemo drug was affecting her blood pressure so they stopped the treatment til they could get her blood pressure under control  Pt states they are currently adjusting her medications now

## 2015-12-04 ENCOUNTER — Encounter (HOSPITAL_COMMUNITY): Payer: Self-pay | Admitting: Family Medicine

## 2015-12-04 DIAGNOSIS — I249 Acute ischemic heart disease, unspecified: Secondary | ICD-10-CM

## 2015-12-04 DIAGNOSIS — I214 Non-ST elevation (NSTEMI) myocardial infarction: Principal | ICD-10-CM | POA: Diagnosis present

## 2015-12-04 DIAGNOSIS — I10 Essential (primary) hypertension: Secondary | ICD-10-CM

## 2015-12-04 DIAGNOSIS — R079 Chest pain, unspecified: Secondary | ICD-10-CM | POA: Insufficient documentation

## 2015-12-04 DIAGNOSIS — Z8679 Personal history of other diseases of the circulatory system: Secondary | ICD-10-CM

## 2015-12-04 DIAGNOSIS — D6959 Other secondary thrombocytopenia: Secondary | ICD-10-CM

## 2015-12-04 DIAGNOSIS — I160 Hypertensive urgency: Secondary | ICD-10-CM

## 2015-12-04 DIAGNOSIS — T451X5A Adverse effect of antineoplastic and immunosuppressive drugs, initial encounter: Secondary | ICD-10-CM

## 2015-12-04 LAB — BASIC METABOLIC PANEL
Anion gap: 9 (ref 5–15)
BUN: 10 mg/dL (ref 6–20)
CALCIUM: 9.5 mg/dL (ref 8.9–10.3)
CO2: 29 mmol/L (ref 22–32)
CREATININE: 0.55 mg/dL (ref 0.44–1.00)
Chloride: 101 mmol/L (ref 101–111)
GFR calc non Af Amer: >60 mL/min (ref >60)
GLUCOSE: 104 mg/dL — AB (ref 65–99)
Potassium: 3.6 mmol/L (ref 3.5–5.1)
Sodium: 139 mmol/L (ref 135–145)

## 2015-12-04 LAB — TROPONIN I
TROPONIN I: 0.67 ng/mL — AB (ref <0.031)
Troponin I: 1.13 ng/mL (ref <0.031)
Troponin I: 0.47 ng/mL — ABNORMAL HIGH (ref <0.031)

## 2015-12-04 LAB — HEPARIN LEVEL (UNFRACTIONATED)
HEPARIN UNFRACTIONATED: 0.55 [IU]/mL (ref 0.30–0.70)
Heparin Unfractionated: 0.55 IU/mL (ref 0.30–0.70)

## 2015-12-04 LAB — CBC
HCT: 40.3 % (ref 36.0–46.0)
Hemoglobin: 13.6 g/dL (ref 12.0–15.0)
MCH: 34.9 pg — AB (ref 26.0–34.0)
MCHC: 33.7 g/dL (ref 30.0–36.0)
MCV: 103.3 fL — ABNORMAL HIGH (ref 78.0–100.0)
PLATELETS: 133 10*3/uL — AB (ref 150–400)
RBC: 3.9 MIL/uL (ref 3.87–5.11)
RDW: 14.9 % (ref 11.5–15.5)
WBC: 5.1 10*3/uL (ref 4.0–10.5)

## 2015-12-04 LAB — LIPID PANEL
CHOLESTEROL: 232 mg/dL — AB (ref 0–200)
HDL: 70 mg/dL (ref >40)
LDL Cholesterol: 148 mg/dL — ABNORMAL HIGH (ref 0–99)
Total CHOL/HDL Ratio: 3.3 RATIO
Triglycerides: 68 mg/dL (ref <150)
VLDL: 14 mg/dL (ref 0–40)

## 2015-12-04 LAB — APTT: APTT: 194 s — AB (ref 24–37)

## 2015-12-04 LAB — PROTIME-INR
INR: 1.08 (ref 0.00–1.49)
Prothrombin Time: 14.2 seconds (ref 11.6–15.2)

## 2015-12-04 LAB — BRAIN NATRIURETIC PEPTIDE: B Natriuretic Peptide: 211.7 pg/mL — ABNORMAL HIGH (ref 0.0–100.0)

## 2015-12-04 MED ORDER — METOPROLOL TARTRATE 50 MG PO TABS
50.0000 mg | ORAL_TABLET | Freq: Two times a day (BID) | ORAL | Status: DC
  Administered 2015-12-04 – 2015-12-05 (×3): 50 mg via ORAL
  Filled 2015-12-04: qty 1
  Filled 2015-12-04 (×2): qty 2
  Filled 2015-12-04: qty 1

## 2015-12-04 MED ORDER — SODIUM CHLORIDE 0.9 % IJ SOLN
3.0000 mL | Freq: Two times a day (BID) | INTRAMUSCULAR | Status: DC
  Administered 2015-12-04: 3 mL via INTRAVENOUS

## 2015-12-04 MED ORDER — DIPHENHYDRAMINE HCL (SLEEP) 25 MG PO TABS: 25.0000 mg | ORAL_TABLET | Freq: Every evening | ORAL | Status: DC | PRN

## 2015-12-04 MED ORDER — ATORVASTATIN CALCIUM 40 MG PO TABS: 40.0000 mg | ORAL_TABLET | Freq: Every day | ORAL | Status: DC

## 2015-12-04 MED ORDER — LEVOTHYROXINE SODIUM 88 MCG PO TABS
88.0000 ug | ORAL_TABLET | Freq: Every day | ORAL | Status: DC
  Administered 2015-12-04 – 2015-12-09 (×6): 88 ug via ORAL
  Filled 2015-12-04 (×7): qty 1

## 2015-12-04 MED ORDER — LORATADINE 10 MG PO TABS: 10.0000 mg | ORAL_TABLET | Freq: Every day | ORAL | Status: DC | PRN

## 2015-12-04 MED ORDER — MORPHINE SULFATE (PF) 2 MG/ML IV SOLN
2.0000 mg | INTRAVENOUS | Status: DC | PRN
  Administered 2015-12-04: 2 mg via INTRAVENOUS
  Filled 2015-12-04: qty 1

## 2015-12-04 MED ORDER — SODIUM CHLORIDE 0.9 % IV SOLN: 250.0000 mL | INTRAVENOUS | Status: DC | PRN

## 2015-12-04 MED ORDER — HEPARIN (PORCINE) IN NACL 100-0.45 UNIT/ML-% IJ SOLN
750.0000 [IU]/h | INTRAMUSCULAR | Status: DC
  Administered 2015-12-04 – 2015-12-05 (×2): 750 [IU]/h via INTRAVENOUS
  Filled 2015-12-04 (×2): qty 250

## 2015-12-04 MED ORDER — PROPAFENONE HCL 225 MG PO TABS
225.0000 mg | ORAL_TABLET | Freq: Two times a day (BID) | ORAL | Status: DC
  Administered 2015-12-04 (×2): 225 mg via ORAL
  Filled 2015-12-04 (×3): qty 1

## 2015-12-04 MED ORDER — PROMETHAZINE HCL 25 MG/ML IJ SOLN
12.5000 mg | INTRAMUSCULAR | Status: DC | PRN
  Administered 2015-12-04: 12.5 mg via INTRAVENOUS
  Filled 2015-12-04: qty 1

## 2015-12-04 MED ORDER — HEPARIN SODIUM (PORCINE) 5000 UNIT/ML IJ SOLN
5000.0000 [IU] | Freq: Three times a day (TID) | INTRAMUSCULAR | Status: DC
  Filled 2015-12-04 (×4): qty 1

## 2015-12-04 MED ORDER — MELATONIN 5 MG PO CAPS: 1.0000 | ORAL_CAPSULE | Freq: Every evening | ORAL | Status: DC | PRN

## 2015-12-04 MED ORDER — ASPIRIN EC 325 MG PO TBEC
325.0000 mg | DELAYED_RELEASE_TABLET | Freq: Every morning | ORAL | Status: DC
  Filled 2015-12-04: qty 1

## 2015-12-04 MED ORDER — NITROGLYCERIN 0.4 MG SL SUBL
0.4000 mg | SUBLINGUAL_TABLET | SUBLINGUAL | Status: DC | PRN
  Administered 2015-12-04: 0.4 mg via SUBLINGUAL
  Filled 2015-12-04: qty 1

## 2015-12-04 MED ORDER — MAGNESIUM HYDROXIDE 400 MG/5ML PO SUSP: 30.0000 mL | Freq: Every day | ORAL | Status: DC | PRN

## 2015-12-04 MED ORDER — NITROGLYCERIN IN D5W 200-5 MCG/ML-% IV SOLN
0.0000 ug/min | INTRAVENOUS | Status: DC
  Administered 2015-12-04: 10 ug/min via INTRAVENOUS
  Filled 2015-12-04: qty 250

## 2015-12-04 MED ORDER — ADULT MULTIVITAMIN W/MINERALS CH
1.0000 | ORAL_TABLET | Freq: Every morning | ORAL | Status: DC
  Administered 2015-12-07 – 2015-12-09 (×3): 1 via ORAL
  Filled 2015-12-04 (×6): qty 1

## 2015-12-04 MED ORDER — HEPARIN BOLUS VIA INFUSION
3000.0000 [IU] | Freq: Once | INTRAVENOUS | Status: AC
  Administered 2015-12-04: 3000 [IU] via INTRAVENOUS
  Filled 2015-12-04: qty 3000

## 2015-12-04 MED ORDER — HYDRALAZINE HCL 20 MG/ML IJ SOLN
10.0000 mg | INTRAMUSCULAR | Status: DC | PRN
  Administered 2015-12-05: 10 mg via INTRAVENOUS
  Filled 2015-12-04 (×2): qty 1

## 2015-12-04 MED ORDER — PROPAFENONE HCL 150 MG PO TABS
150.0000 mg | ORAL_TABLET | Freq: Three times a day (TID) | ORAL | Status: DC
  Filled 2015-12-04 (×2): qty 1

## 2015-12-04 MED ORDER — ONDANSETRON HCL 4 MG/2ML IJ SOLN
4.0000 mg | Freq: Four times a day (QID) | INTRAMUSCULAR | Status: DC | PRN
  Administered 2015-12-05 – 2015-12-06 (×2): 4 mg via INTRAVENOUS
  Filled 2015-12-04 (×2): qty 2

## 2015-12-04 MED ORDER — ASPIRIN 81 MG PO CHEW: 324.0000 mg | CHEWABLE_TABLET | Freq: Once | ORAL | Status: DC

## 2015-12-04 MED ORDER — ACETAMINOPHEN 325 MG PO TABS
650.0000 mg | ORAL_TABLET | ORAL | Status: DC | PRN
  Administered 2015-12-04 – 2015-12-06 (×7): 650 mg via ORAL
  Filled 2015-12-04 (×6): qty 2

## 2015-12-04 MED ORDER — SODIUM CHLORIDE 0.9 % WEIGHT BASED INFUSION: 1.0000 mL/kg/h | INTRAVENOUS | Status: DC

## 2015-12-04 MED ORDER — SODIUM CHLORIDE 0.9 % IJ SOLN
10.0000 mL | INTRAMUSCULAR | Status: DC | PRN
Start: 2015-12-04 — End: 2015-12-09

## 2015-12-04 MED ORDER — ASPIRIN EC 81 MG PO TBEC
81.0000 mg | DELAYED_RELEASE_TABLET | Freq: Every morning | ORAL | Status: DC
  Filled 2015-12-04: qty 1

## 2015-12-04 MED ORDER — POLYETHYLENE GLYCOL 3350 17 G PO PACK
17.0000 g | PACK | Freq: Every day | ORAL | Status: DC | PRN
  Filled 2015-12-04: qty 1

## 2015-12-04 MED ORDER — IRBESARTAN 300 MG PO TABS
300.0000 mg | ORAL_TABLET | Freq: Every day | ORAL | Status: DC
  Administered 2015-12-04 – 2015-12-09 (×6): 300 mg via ORAL
  Filled 2015-12-04 (×6): qty 1

## 2015-12-04 MED ORDER — DOCUSATE SODIUM 100 MG PO CAPS
200.0000 mg | ORAL_CAPSULE | Freq: Two times a day (BID) | ORAL | Status: DC
  Administered 2015-12-04 – 2015-12-05 (×2): 200 mg via ORAL
  Filled 2015-12-04 (×7): qty 2

## 2015-12-04 MED ORDER — SPIRONOLACTONE 25 MG PO TABS
25.0000 mg | ORAL_TABLET | Freq: Every day | ORAL | Status: DC
  Administered 2015-12-04 – 2015-12-06 (×3): 25 mg via ORAL
  Filled 2015-12-04 (×3): qty 1

## 2015-12-04 MED ORDER — LORAZEPAM 1 MG PO TABS: 1.0000 mg | ORAL_TABLET | Freq: Four times a day (QID) | ORAL | Status: DC | PRN

## 2015-12-04 MED ORDER — CLOPIDOGREL BISULFATE 75 MG PO TABS
75.0000 mg | ORAL_TABLET | Freq: Every day | ORAL | Status: DC
  Administered 2015-12-04 – 2015-12-08 (×6): 75 mg via ORAL
  Filled 2015-12-04 (×8): qty 1

## 2015-12-04 MED ORDER — PANTOPRAZOLE SODIUM 40 MG PO TBEC
40.0000 mg | DELAYED_RELEASE_TABLET | Freq: Every day | ORAL | Status: DC
  Administered 2015-12-04 – 2015-12-09 (×6): 40 mg via ORAL
  Filled 2015-12-04 (×6): qty 1

## 2015-12-04 MED ORDER — BISACODYL 5 MG PO TBEC
5.0000 mg | DELAYED_RELEASE_TABLET | Freq: Every day | ORAL | Status: DC | PRN
  Filled 2015-12-04: qty 1

## 2015-12-04 MED ORDER — SODIUM CHLORIDE 0.9 % IJ SOLN: 3.0000 mL | INTRAMUSCULAR | Status: DC | PRN

## 2015-12-04 MED ORDER — ASPIRIN EC 81 MG PO TBEC
81.0000 mg | DELAYED_RELEASE_TABLET | Freq: Every morning | ORAL | Status: DC
Start: 2015-12-04 — End: 2015-12-09
  Administered 2015-12-04 – 2015-12-09 (×6): 81 mg via ORAL
  Filled 2015-12-04 (×7): qty 1

## 2015-12-04 MED ORDER — SODIUM CHLORIDE 0.9 % WEIGHT BASED INFUSION
3.0000 mL/kg/h | INTRAVENOUS | Status: DC
  Administered 2015-12-05: 3 mL/kg/h via INTRAVENOUS

## 2015-12-04 MED ORDER — ESTRADIOL 1 MG PO TABS
1.0000 mg | ORAL_TABLET | Freq: Every day | ORAL | Status: DC
  Administered 2015-12-04: 1 mg via ORAL
  Filled 2015-12-04 (×2): qty 1

## 2015-12-04 MED ORDER — ZOLPIDEM TARTRATE 5 MG PO TABS
5.0000 mg | ORAL_TABLET | Freq: Every day | ORAL | Status: DC
  Filled 2015-12-04: qty 1

## 2015-12-04 NOTE — Progress Notes (Signed)
ANTICOAGULATION CONSULT NOTE - Follow Up Consult  Pharmacy Consult for heparin Indication: chest pain/ACS  Vital Signs: Temp: 98 F (36.7 C) (01/05 1153) Temp Source: Oral (01/05 1153) BP: 156/97 mmHg (01/05 1945) Pulse Rate: 63 (01/05 1945)  Assessment: 74 YOF presents with chest pain and positive troponin.  She has history of afib on plavix/ASA prior admission.  She is scheduled for left heart catheterization on 12/05/15 per cardiology.  Heparin level 0.55, remains therapeutic  Goal of Therapy:  Heparin level 0.3-0.7 units/ml Monitor platelets by anticoagulation protocol: Yes   Plan:   Continue heparin 750 units/hr   Daily heparin level and CBC  Watch platelets  Follow up plans for cath.  [name] PharmD, BCPS Pager [phone] 12/04/2015 8:34 PM

## 2015-12-04 NOTE — Interval H&P Note (Signed)
Cath Lab Visit (complete for each Cath Lab visit)  Clinical Evaluation Leading to the Procedure:   ACS: Yes.    Non-ACS:    Anginal Classification: CCS III  Anti-ischemic medical therapy: Maximal Therapy (2 or more classes of medications)  Non-Invasive Test Results: No non-invasive testing performed  Prior CABG: No previous CABG      History and Physical Interval Note:  12/04/2015 7:55 PM  [name]  has presented today for surgery, with the diagnosis of NSTEMI  The various methods of treatment have been discussed with the patient and family. After consideration of risks, benefits and other options for treatment, the patient has consented to  Procedure(s): Left Heart Cath and Coronary Angiography (N/A) as a surgical intervention .  The patient's history has been reviewed, patient examined, no change in status, stable for surgery.  I have reviewed the patient's chart and labs.  Questions were answered to the patient's satisfaction.     Sinclair Grooms

## 2015-12-04 NOTE — H&P (View-Only) (Signed)
  CONSULTATION NOTE  Reason for Consult: Hypertensive emergency, elevated troponin  Requesting Physician: Dr. Chiu  Cardiologist: Dr. Harding  HPI: This is a 75 y.o. female with a past medical history significant for paroxysmal atrial fibrillation and unfortunately recurrent ovarian cancer. She has no known history of coronary artery disease. Her A. fib is managed with beta blocker and Rythmol. Over the past 6 months she's had problems with regulating her blood pressure. This is been increasing and according to her oncologist was thought this might be related to chemotherapy. She presented to the emergency department early this morning with chest pain which radiated to her left shoulder which was described as moderate in intensity, constant at this time however resolved by the time she reached the emergency department. Blood pressure on admission was 210/100. EKG shows sinus rhythm with borderline anterolateral ST depression and T-wave changes. BNP on admission was 211, troponin was elevated at 0.24 and peaked at 1.13. She was placed on IV heparin and her troponin is now decreased to 0.47. Lipid profile shows an elevated LDL cholesterol 148 with total cholesterol 232. Currently she is chest pain-free. Blood pressure remains significantly elevated.  PMHx:  Past Medical History  Diagnosis Date  . Hypertension   . Dyslipidemia   . Hypothyroidism   . Arthritis     OSTEOARTHRITIS   -- CONSTANT PAIN RIGHT HIP---AND PAIN LEFT KNEE--PT STATES SHE GETS INJECTIONS INTO HER KNEE  . PAF (paroxysmal atrial fibrillation) (HCC)     Only on Plavix -- not full Anticoagulation per pt. request  . Complication of anesthesia     BLOOD PRESSURE DROPPED WITH NASAL SURGERY, ONE OF THE CARPAL TUNNEL REPAIRS AND DURING A COLONOSCOPY  . History of skin cancer   . Constipation   . Ovarian cancer (HCC) 01/2013    Recurrence since 2014/2016   Past Surgical History  Procedure Laterality Date  . Bilateral carpal  tunnel repair  2007  . Dilation and curettage of uterus  1969  . Surgery for ruptured ovarian cyst  1969  . Rhinoplasty for fractured nose  1986  . Left knee arthroscopy   2011  . Total hip arthroplasty  02/25/2012    Procedure: TOTAL HIP ARTHROPLASTY ANTERIOR APPROACH;  Surgeon: Christopher Y Blackman, MD;  Location: [facility];  Service: Orthopedics;  Laterality: Right;  . Tonsillectomy  1962  . Laparotomy Bilateral 02/13/2013    Procedure: EXPLORATORY LAPAROTOMY TOTAL ABDOMINAL HYSTERECTOMY BILATERAL SALPINGO-OOPHORECTOMY, Partial Rectal Resection with Reanastamosis;  Surgeon: Daniel L Clarke-Pearson, MD;  Location: [facility];  Service: Gynecology;  Laterality: Bilateral;  . Omentectomy  02/13/2013    Procedure: OMENTECTOMY;  Surgeon: Daniel L Clarke-Pearson, MD;  Location: [facility];  Service: Gynecology;;  . Lymphadenectomy Right 02/13/2013    Procedure: PEVLIC  LYMPHADENECTOMY, DEBULKING right pelvic tumor nodules;  Surgeon: Daniel L Clarke-Pearson, MD;  Location: [facility];  Service: Gynecology;  Laterality: Right;  . Total abdominal hysterectomy  01/2013  . Transthoracic echocardiogram  May 2014    Normal LV size function. EF 60-65%. Grade 1 diastolic function. Mild MR and mildly elevated PA pressures of 37 mmHg per  . Nm myoview ltd  June 2010     subbmaximal with no ischemia or infarction.  . Total knee arthroplasty Left 01/03/2015    Procedure: LEFT TOTAL KNEE ARTHROPLASTY;  Surgeon: Christopher Y Blackman, MD;  Location: [facility];  Service: Orthopedics;  Laterality: Left;    FAMHx: Family History  Problem Relation Age of Onset  . Hypertension Mother   .   Basal cell carcinoma Mother   . AAA (abdominal aortic aneurysm) Father   . Heart Problems Maternal Uncle   . Heart Problems Maternal Grandfather   . AAA (abdominal aortic aneurysm) Paternal Grandmother   . Prostate cancer Maternal Uncle 70  . Prostate cancer Other   . Other Daughter     one daughter had TAH-BSO at 44; other daughter will have  one soon    SOCHx:  reports that she has never smoked. She has never used smokeless tobacco. She reports that she does not drink alcohol or use illicit drugs.  ALLERGIES: Allergies  Allergen Reactions  . Coreg [Carvedilol] Shortness Of Breath  . Ceftin [Cefuroxime Axetil]     Interferes with propafenone.    . Clonidine Derivatives   . Codeine Other (See Comments)    Does not like the feeling she gets  . Flexeril [Cyclobenzaprine] Other (See Comments)    Pt states "increased heart rate"  . Levaquin [Levofloxacin In D5w]     Interferes with propafenone.   . Lipitor [Atorvastatin] Dermatitis    "feels like bugs are biting her"   . Lisinopril     LIP NUMBNESS  . Tegaderm Ag Mesh [Silver]   . Tape Rash    TEGADERM.   (use opsite on PAC)  . Ultram [Tramadol] Palpitations    ROS: A comprehensive review of systems was negative except for: Cardiovascular: positive for chest pain  HOME MEDICATIONS:   Medication List    ASK your doctor about these medications        amoxicillin 500 MG capsule  Commonly known as:  AMOXIL  Reported on 11/20/2015     aspirin EC 81 MG tablet  Take 81 mg by mouth every morning.     bisacodyl 5 MG EC tablet  Commonly known as:  DULCOLAX  Take 5 mg by mouth daily as needed for moderate constipation. Reported on 11/20/2015     clopidogrel 75 MG tablet  Commonly known as:  PLAVIX  TAKE 1 TABLET BY MOUTH AT BEDTIME     desonide 0.05 % cream  Commonly known as:  DESOWEN  Apply 1 application topically 2 (two) times daily.     dexamethasone 4 MG tablet  Commonly known as:  DECADRON  Take 5 tabs with food 12 hrs and 6 hrs prior to taxol. Then take 1 tab with food every morning for the next 4 days.     diphenhydrAMINE 25 MG tablet  Commonly known as:  SOMINEX  Take 25-50 mg by mouth at bedtime as needed for sleep.     docusate sodium 100 MG capsule  Commonly known as:  COLACE  Take 200 mg by mouth 2 (two) times daily.     estradiol 1 MG  tablet  Commonly known as:  ESTRACE  Take 1 tablet (1 mg total) by mouth every morning. PT STATES SHE WANTS ESTRACE--NOT THE GENERIC     GLUCOSAMINE PO  Take 1,000 mg by mouth every morning.     ICY HOT EX  Apply 1 application topically daily as needed (pain.).     SYNTHROID 88 MCG tablet  Generic drug:  levothyroxine  Take 88 mcg by mouth daily before breakfast.     levothyroxine 88 MCG tablet  Commonly known as:  SYNTHROID, LEVOTHROID  Take 88 mcg by mouth every morning. Takes on an empty stomach. PT STATES SHE NEEDS THE SYNTHROID--DOES NOT WANT THE GENERIC     lidocaine-prilocaine cream  Commonly known as:  EMLA    Apply to Porta-Cath site 1-2 hours prior to access as directed.     loratadine 10 MG tablet  Commonly known as:  CLARITIN  Take 10 mg by mouth daily as needed for allergies. Reported on 11/20/2015     LORazepam 1 MG tablet  Commonly known as:  ATIVAN  Place 1/2 -1 tablet under the tongue or swallow every 6 hrs as needed for nausea. Will make drowsy.     magnesium hydroxide 800 MG/5ML suspension  Commonly known as:  MILK OF MAGNESIA  Take 30 mLs by mouth daily as needed for constipation. Reported on 11/20/2015     Melatonin 5 MG Caps  Take 1 capsule by mouth at bedtime as needed (sleep).     metoprolol 50 MG tablet  Commonly known as:  LOPRESSOR  Take 1 tablet (50 mg total) by mouth 2 (two) times daily.     multivitamin with minerals Tabs tablet  Take 1 tablet by mouth every morning.     ondansetron 8 MG tablet  Commonly known as:  ZOFRAN  Take 1 tablet (8 mg total) by mouth every 8 (eight) hours as needed for nausea or vomiting (Will not make drowsy).     pantoprazole 40 MG tablet  Commonly known as:  PROTONIX  Take 1 tablet (40 mg total) by mouth daily.     polyethylene glycol packet  Commonly known as:  MIRALAX / GLYCOLAX  Take 17 g by mouth every morning.     potassium chloride 10 MEQ tablet  Commonly known as:  K-DUR  Take 10 mEq by mouth  every Monday, Wednesday, and Friday.     propafenone 225 MG tablet  Commonly known as:  RYTHMOL  TAKE 1 TABLET (225 MG TOTAL) BY MOUTH 2 (TWO) TIMES DAILY.     spironolactone 25 MG tablet  Commonly known as:  ALDACTONE  Take 25 mg by mouth daily.     SYSTANE OP  Place 1 drop into both eyes 2 (two) times daily.     valsartan 320 MG tablet  Commonly known as:  DIOVAN  TAKE 1 TABLET (320 MG TOTAL) BY MOUTH DAILY.        HOSPITAL MEDICATIONS: I have reviewed the patient's current medications.  VITALS: Blood pressure 182/108, pulse 64, temperature 98 F (36.7 C), temperature source Oral, resp. rate 18, SpO2 96 %.  PHYSICAL EXAM: General appearance: alert and no distress Neck: no carotid bruit and no JVD Lungs: clear to auscultation bilaterally Heart: regular rate and rhythm, S1, S2 normal, no murmur, click, rub or gallop Abdomen: soft, non-tender; bowel sounds normal; no masses,  no organomegaly Extremities: extremities normal, atraumatic, no cyanosis or edema Pulses: 2+ and symmetric Skin: Skin color, texture, turgor normal. No rashes or lesions Neurologic: Grossly normal Psych: Pleasant  LABS: Results for orders placed or performed during the hospital encounter of 12/03/15 (from the past 48 hour(s))  Brain natriuretic peptide     Status: Abnormal   Collection Time: 12/03/15  9:47 PM  Result Value Ref Range   B Natriuretic Peptide 211.7 (H) 0.0 - 100.0 pg/mL  CBC with Differential     Status: Abnormal   Collection Time: 12/03/15  9:50 PM  Result Value Ref Range   WBC 5.3 4.0 - 10.5 K/uL   RBC 3.70 (L) 3.87 - 5.11 MIL/uL   Hemoglobin 12.6 12.0 - 15.0 g/dL   HCT 37.6 36.0 - 46.0 %   MCV 101.6 (H) 78.0 - 100.0 fL   MCH 34.1 (H)   26.0 - 34.0 pg   MCHC 33.5 30.0 - 36.0 g/dL   RDW 14.7 11.5 - 15.5 %   Platelets 132 (L) 150 - 400 K/uL   Neutrophils Relative % 61 %   Neutro Abs 3.3 1.7 - 7.7 K/uL   Lymphocytes Relative 26 %   Lymphs Abs 1.4 0.7 - 4.0 K/uL   Monocytes  Relative 11 %   Monocytes Absolute 0.6 0.1 - 1.0 K/uL   Eosinophils Relative 1 %   Eosinophils Absolute 0.0 0.0 - 0.7 K/uL   Basophils Relative 1 %   Basophils Absolute 0.0 0.0 - 0.1 K/uL  Basic metabolic panel     Status: Abnormal   Collection Time: 12/03/15  9:50 PM  Result Value Ref Range   Sodium 136 135 - 145 mmol/L   Potassium 3.7 3.5 - 5.1 mmol/L   Chloride 101 101 - 111 mmol/L   CO2 28 22 - 32 mmol/L   Glucose, Bld 105 (H) 65 - 99 mg/dL   BUN 10 6 - 20 mg/dL   Creatinine, Ser 0.49 0.44 - 1.00 mg/dL   Calcium 9.3 8.9 - 10.3 mg/dL   GFR calc non Af Amer >60 >60 mL/min   GFR calc Af Amer >60 >60 mL/min    Comment: (NOTE) The eGFR has been calculated using the CKD EPI equation. This calculation has not been validated in all clinical situations. eGFR's persistently <60 mL/min signify possible Chronic Kidney Disease.    Anion gap 7 5 - 15  Troponin I     Status: Abnormal   Collection Time: 12/03/15  9:50 PM  Result Value Ref Range   Troponin I 0.24 (H) <0.031 ng/mL    Comment:        PERSISTENTLY INCREASED TROPONIN VALUES IN THE RANGE OF 0.04-0.49 ng/mL CAN BE SEEN IN:       -UNSTABLE ANGINA       -CONGESTIVE HEART FAILURE       -MYOCARDITIS       -CHEST TRAUMA       -ARRYHTHMIAS       -LATE PRESENTING MYOCARDIAL INFARCTION       -COPD   CLINICAL FOLLOW-UP RECOMMENDED.   I-Stat Troponin, ED (not at [facility])     Status: Abnormal   Collection Time: 12/03/15  9:58 PM  Result Value Ref Range   Troponin i, poc 0.26 (HH) 0.00 - 0.08 ng/mL   Comment NOTIFIED PHYSICIAN    Comment 3            Comment: Due to the release kinetics of cTnI, a negative result within the first hours of the onset of symptoms does not rule out myocardial infarction with certainty. If myocardial infarction is still suspected, repeat the test at appropriate intervals.   Troponin I     Status: Abnormal   Collection Time: 12/04/15  2:00 AM  Result Value Ref Range   Troponin I 1.13 (HH) <0.031  ng/mL    Comment:        POSSIBLE MYOCARDIAL ISCHEMIA. SERIAL TESTING RECOMMENDED. CRITICAL RESULT CALLED TO, READ BACK BY AND VERIFIED WITH: Max Sane RN [id number] 12/04/15 A NAVARRO   Troponin I     Status: Abnormal   Collection Time: 12/04/15  8:23 AM  Result Value Ref Range   Troponin I 0.67 (HH) <0.031 ng/mL    Comment:        POSSIBLE MYOCARDIAL ISCHEMIA. SERIAL TESTING RECOMMENDED. CRITICAL VALUE NOTED.  VALUE IS CONSISTENT WITH PREVIOUSLY REPORTED AND CALLED VALUE.  Basic metabolic panel     Status: Abnormal   Collection Time: 12/04/15  8:23 AM  Result Value Ref Range   Sodium 139 135 - 145 mmol/L   Potassium 3.6 3.5 - 5.1 mmol/L   Chloride 101 101 - 111 mmol/L   CO2 29 22 - 32 mmol/L   Glucose, Bld 104 (H) 65 - 99 mg/dL   BUN 10 6 - 20 mg/dL   Creatinine, Ser 0.55 0.44 - 1.00 mg/dL   Calcium 9.5 8.9 - 10.3 mg/dL   GFR calc non Af Amer >60 >60 mL/min   GFR calc Af Amer >60 >60 mL/min    Comment: (NOTE) The eGFR has been calculated using the CKD EPI equation. This calculation has not been validated in all clinical situations. eGFR's persistently <60 mL/min signify possible Chronic Kidney Disease.    Anion gap 9 5 - 15  CBC     Status: Abnormal   Collection Time: 12/04/15  8:23 AM  Result Value Ref Range   WBC 5.1 4.0 - 10.5 K/uL   RBC 3.90 3.87 - 5.11 MIL/uL   Hemoglobin 13.6 12.0 - 15.0 g/dL   HCT 40.3 36.0 - 46.0 %   MCV 103.3 (H) 78.0 - 100.0 fL   MCH 34.9 (H) 26.0 - 34.0 pg   MCHC 33.7 30.0 - 36.0 g/dL   RDW 14.9 11.5 - 15.5 %   Platelets 133 (L) 150 - 400 K/uL  Lipid panel     Status: Abnormal   Collection Time: 12/04/15  8:23 AM  Result Value Ref Range   Cholesterol 232 (H) 0 - 200 mg/dL   Triglycerides 68 <150 mg/dL   HDL 70 >40 mg/dL   Total CHOL/HDL Ratio 3.3 RATIO   VLDL 14 0 - 40 mg/dL   LDL Cholesterol 148 (H) 0 - 99 mg/dL    Comment:        Total Cholesterol/HDL:CHD Risk Coronary Heart Disease Risk Table                     Men   Women  1/2  Average Risk   3.4   3.3  Average Risk       5.0   4.4  2 X Average Risk   9.6   7.1  3 X Average Risk  23.4   11.0        Use the calculated Patient Ratio above and the CHD Risk Table to determine the patient's CHD Risk.        ATP III CLASSIFICATION (LDL):  <100     mg/dL   Optimal  100-129  mg/dL   Near or Above                    Optimal  130-159  mg/dL   Borderline  160-189  mg/dL   High  >190     mg/dL   Very High Performed at [place]     Status: None   Collection Time: 12/04/15  8:23 AM  Result Value Ref Range   Prothrombin Time 14.2 11.6 - 15.2 seconds   INR 1.08 0.00 - 1.49  APTT     Status: Abnormal   Collection Time: 12/04/15  8:23 AM  Result Value Ref Range   aPTT 194 (H) 24 - 37 seconds    Comment:        IF BASELINE aPTT IS ELEVATED, SUGGEST PATIENT RISK ASSESSMENT BE USED TO DETERMINE APPROPRIATE  ANTICOAGULANT THERAPY. SPECIMEN CHECKED FOR CLOTS REPEATED TO VERIFY   Heparin level (unfractionated)     Status: None   Collection Time: 12/04/15  1:46 PM  Result Value Ref Range   Heparin Unfractionated 0.55 0.30 - 0.70 IU/mL    Comment:        IF HEPARIN RESULTS ARE BELOW EXPECTED VALUES, AND PATIENT DOSAGE HAS BEEN CONFIRMED, SUGGEST FOLLOW UP TESTING OF ANTITHROMBIN III LEVELS.   Troponin I     Status: Abnormal   Collection Time: 12/04/15  1:47 PM  Result Value Ref Range   Troponin I 0.47 (H) <0.031 ng/mL    Comment:        PERSISTENTLY INCREASED TROPONIN VALUES IN THE RANGE OF 0.04-0.49 ng/mL CAN BE SEEN IN:       -UNSTABLE ANGINA       -CONGESTIVE HEART FAILURE       -MYOCARDITIS       -CHEST TRAUMA       -ARRYHTHMIAS       -LATE PRESENTING MYOCARDIAL INFARCTION       -COPD   CLINICAL FOLLOW-UP RECOMMENDED.     IMAGING: Dg Chest 2 View  12/04/2015  CLINICAL DATA:  Sudden onset of mid chest pain this evening. EXAM: CHEST  2 VIEW COMPARISON:  Chest radiograph 11/03/2015, chest CT 11/06/2015 FINDINGS: Tip of the  right chest port in the mid SVC. The cardiomediastinal contours are normal. Pulmonary vasculature is normal. No consolidation, pleural effusion, or pneumothorax. No acute osseous abnormalities are seen. Degenerative change seen in the thoracic spine. IMPRESSION: No acute pulmonary process. Electronically Signed   By: Melanie  Ehinger M.D.   On: 12/04/2015 00:41    HOSPITAL DIAGNOSES: Principal Problem:   ACS (acute coronary syndrome) (HCC) Active Problems:   History of atrial fibrillation   Chemotherapy induced thrombocytopenia   Hypertension due to drug   Elevated troponin   Hypertensive urgency   IMPRESSION: 1. NSTEMI 2. History of atrial fibrillation on Rythmol 3. Hypertensive urgency/emergency  RECOMMENDATION: 1. NSTEMI- troponin is significantly elevated with a typical rise and fall of acute coronary syndrome. The troponin elevation was preceded by a typical left-sided chest pain which has resolved. She's currently on IV heparin. We will start a nitroglycerin drip due to significant persistent hypertension. I recommend transfer to [place] for left heart catheterization tomorrow. I scheduled with Dr. Hank Smith at 12:00. She will need to be nothing by mouth after midnight for cardiac catheterization. 2. History of atrial fibrillation on Rythmol- we will discontinue Rythmol as it is contraindicated in the setting of ischemic heart disease. Continue beta blocker for rate control. 3. Hypertensive urgency/emergency- continue home oral antihypertensives. She has hydralazine when necessary for uncontrolled hypertension. We will start a nitroglycerin drip for better blood pressure control and treatment of angina.  Discussed with Dr. Chiu. He will transfer to my service in a stepdown bed.  Time Spent Directly with Patient: 45 minutes  Zorana Brockwell C. Hawthorne Day, MD, FACC Attending Cardiologist [facility]  Wenceslao Loper C Shabreka Coulon 12/04/2015, 3:33 PM       

## 2015-12-04 NOTE — ED Notes (Addendum)
03:00 AM : Critical TROPONIN INCREASE 0.24 ---> 1.13. Pt currently asleep. When awoken, she denies any chest discomfort.  03:03 AM : Dr. Myna Hidalgo paged / EDP Dr. Vanita Panda given FYI 03:08 AM : Dr. Myna Hidalgo updated

## 2015-12-04 NOTE — Consult Note (Signed)
CONSULTATION NOTE  Reason for Consult: Hypertensive emergency, elevated troponin  Requesting Physician: Dr. Wyline Copas  Cardiologist: Dr. Ellyn Hack  HPI: This is a 75 y.o. female with a past medical history significant for paroxysmal atrial fibrillation and unfortunately recurrent ovarian cancer. She has no known history of coronary artery disease. Her A. fib is managed with beta blocker and Rythmol. Over the past 6 months she's had problems with regulating her blood pressure. This is been increasing and according to her oncologist was thought this might be related to chemotherapy. She presented to the emergency department early this morning with chest pain which radiated to her left shoulder which was described as moderate in intensity, constant at this time however resolved by the time she reached the emergency department. Blood pressure on admission was 210/100. EKG shows sinus rhythm with borderline anterolateral ST depression and T-wave changes. BNP on admission was 211, troponin was elevated at 0.24 and peaked at 1.13. She was placed on IV heparin and her troponin is now decreased to 0.47. Lipid profile shows an elevated LDL cholesterol 148 with total cholesterol 232. Currently she is chest pain-free. Blood pressure remains significantly elevated.  PMHx:  Past Medical History  Diagnosis Date  . Hypertension   . Dyslipidemia   . Hypothyroidism   . Arthritis     OSTEOARTHRITIS   -- CONSTANT PAIN RIGHT HIP---AND PAIN LEFT KNEE--PT STATES SHE GETS INJECTIONS INTO HER KNEE  . PAF (paroxysmal atrial fibrillation) (HCC)     Only on Plavix -- not full Anticoagulation per pt. request  . Complication of anesthesia     BLOOD PRESSURE DROPPED WITH NASAL SURGERY, ONE OF THE CARPAL TUNNEL REPAIRS AND DURING A COLONOSCOPY  . History of skin cancer   . Constipation   . Ovarian cancer ([place]) 01/2013    Recurrence since 2014/2016   Past Surgical History  Procedure Laterality Date  . Bilateral carpal  tunnel repair  2007  . Dilation and curettage of uterus  1969  . Surgery for ruptured ovarian cyst  1969  . Rhinoplasty for fractured nose  1986  . Left knee arthroscopy   2011  . Total hip arthroplasty  02/25/2012    Procedure: TOTAL HIP ARTHROPLASTY ANTERIOR APPROACH;  Surgeon: Mcarthur Rossetti, MD;  Location: [facility];  Service: Orthopedics;  Laterality: Right;  . Tonsillectomy  1962  . Laparotomy Bilateral 02/13/2013    Procedure: EXPLORATORY LAPAROTOMY TOTAL ABDOMINAL HYSTERECTOMY BILATERAL SALPINGO-OOPHORECTOMY, Partial Rectal Resection with Reanastamosis;  Surgeon: Alvino Chapel, MD;  Location: [facility];  Service: Gynecology;  Laterality: Bilateral;  . Omentectomy  02/13/2013    Procedure: OMENTECTOMY;  Surgeon: Alvino Chapel, MD;  Location: [facility];  Service: Gynecology;;  . Lymphadenectomy Right 02/13/2013    Procedure: PEVLIC  LYMPHADENECTOMY, DEBULKING right pelvic tumor nodules;  Surgeon: Alvino Chapel, MD;  Location: [facility];  Service: Gynecology;  Laterality: Right;  . Total abdominal hysterectomy  01/2013  . Transthoracic echocardiogram  May 2014    Normal LV size function. EF 60-65%. Grade 1 diastolic function. Mild MR and mildly elevated PA pressures of 37 mmHg per  . Nm myoview ltd  June 2010     subbmaximal with no ischemia or infarction.  . Total knee arthroplasty Left 01/03/2015    Procedure: LEFT TOTAL KNEE ARTHROPLASTY;  Surgeon: Mcarthur Rossetti, MD;  Location: [facility];  Service: Orthopedics;  Laterality: Left;    FAMHx: Family History  Problem Relation Age of Onset  . Hypertension Mother   .  Basal cell carcinoma Mother   . AAA (abdominal aortic aneurysm) Father   . Heart Problems Maternal Uncle   . Heart Problems Maternal Grandfather   . AAA (abdominal aortic aneurysm) Paternal Grandmother   . Prostate cancer Maternal Uncle 70  . Prostate cancer Other   . Other Daughter     one daughter had TAH-BSO at 73; other daughter will have  one soon    SOCHx:  reports that she has never smoked. She has never used smokeless tobacco. She reports that she does not drink alcohol or use illicit drugs.  ALLERGIES: Allergies  Allergen Reactions  . Coreg [Carvedilol] Shortness Of Breath  . Ceftin [Cefuroxime Axetil]     Interferes with propafenone.    . Clonidine Derivatives   . Codeine Other (See Comments)    Does not like the feeling she gets  . Flexeril [Cyclobenzaprine] Other (See Comments)    Pt states "increased heart rate"  . Levaquin [Levofloxacin In D5w]     Interferes with propafenone.   . Lipitor [Atorvastatin] Dermatitis    "feels like bugs are biting her"   . Lisinopril     LIP NUMBNESS  . Tegaderm Ag Mesh [Silver]   . Tape Rash    TEGADERM.   (use opsite on PAC)  . Ultram [Tramadol] Palpitations    ROS: A comprehensive review of systems was negative except for: Cardiovascular: positive for chest pain  HOME MEDICATIONS:   Medication List    ASK your doctor about these medications        amoxicillin 500 MG capsule  Commonly known as:  AMOXIL  Reported on 11/20/2015     aspirin EC 81 MG tablet  Take 81 mg by mouth every morning.     bisacodyl 5 MG EC tablet  Commonly known as:  DULCOLAX  Take 5 mg by mouth daily as needed for moderate constipation. Reported on 11/20/2015     clopidogrel 75 MG tablet  Commonly known as:  PLAVIX  TAKE 1 TABLET BY MOUTH AT BEDTIME     desonide 0.05 % cream  Commonly known as:  DESOWEN  Apply 1 application topically 2 (two) times daily.     dexamethasone 4 MG tablet  Commonly known as:  DECADRON  Take 5 tabs with food 12 hrs and 6 hrs prior to taxol. Then take 1 tab with food every morning for the next 4 days.     diphenhydrAMINE 25 MG tablet  Commonly known as:  SOMINEX  Take 25-50 mg by mouth at bedtime as needed for sleep.     docusate sodium 100 MG capsule  Commonly known as:  COLACE  Take 200 mg by mouth 2 (two) times daily.     estradiol 1 MG  tablet  Commonly known as:  ESTRACE  Take 1 tablet (1 mg total) by mouth every morning. PT STATES SHE WANTS ESTRACE--NOT THE GENERIC     GLUCOSAMINE PO  Take 1,000 mg by mouth every morning.     ICY HOT EX  Apply 1 application topically daily as needed (pain.).     SYNTHROID 88 MCG tablet  Generic drug:  levothyroxine  Take 88 mcg by mouth daily before breakfast.     levothyroxine 88 MCG tablet  Commonly known as:  SYNTHROID, LEVOTHROID  Take 88 mcg by mouth every morning. Takes on an empty stomach. PT STATES SHE NEEDS THE SYNTHROID--DOES NOT WANT THE GENERIC     lidocaine-prilocaine cream  Commonly known as:  EMLA  Apply to [facility] site 1-2 hours prior to access as directed.     loratadine 10 MG tablet  Commonly known as:  CLARITIN  Take 10 mg by mouth daily as needed for allergies. Reported on 11/20/2015     LORazepam 1 MG tablet  Commonly known as:  ATIVAN  Place 1/2 -1 tablet under the tongue or swallow every 6 hrs as needed for nausea. Will make drowsy.     magnesium hydroxide 800 MG/5ML suspension  Commonly known as:  MILK OF MAGNESIA  Take 30 mLs by mouth daily as needed for constipation. Reported on 11/20/2015     Melatonin 5 MG Caps  Take 1 capsule by mouth at bedtime as needed (sleep).     metoprolol 50 MG tablet  Commonly known as:  LOPRESSOR  Take 1 tablet (50 mg total) by mouth 2 (two) times daily.     multivitamin with minerals Tabs tablet  Take 1 tablet by mouth every morning.     ondansetron 8 MG tablet  Commonly known as:  ZOFRAN  Take 1 tablet (8 mg total) by mouth every 8 (eight) hours as needed for nausea or vomiting (Will not make drowsy).     pantoprazole 40 MG tablet  Commonly known as:  PROTONIX  Take 1 tablet (40 mg total) by mouth daily.     polyethylene glycol packet  Commonly known as:  MIRALAX / GLYCOLAX  Take 17 g by mouth every morning.     potassium chloride 10 MEQ tablet  Commonly known as:  K-DUR  Take 10 mEq by mouth  every Monday, Wednesday, and Friday.     propafenone 225 MG tablet  Commonly known as:  RYTHMOL  TAKE 1 TABLET (225 MG TOTAL) BY MOUTH 2 (TWO) TIMES DAILY.     spironolactone 25 MG tablet  Commonly known as:  ALDACTONE  Take 25 mg by mouth daily.     SYSTANE OP  Place 1 drop into both eyes 2 (two) times daily.     valsartan 320 MG tablet  Commonly known as:  DIOVAN  TAKE 1 TABLET (320 MG TOTAL) BY MOUTH DAILY.        HOSPITAL MEDICATIONS: I have reviewed the patient's current medications.  VITALS: Blood pressure 182/108, pulse 64, temperature 98 F (36.7 C), temperature source Oral, resp. rate 18, SpO2 96 %.  PHYSICAL EXAM: General appearance: alert and no distress Neck: no carotid bruit and no JVD Lungs: clear to auscultation bilaterally Heart: regular rate and rhythm, S1, S2 normal, no murmur, click, rub or gallop Abdomen: soft, non-tender; bowel sounds normal; no masses,  no organomegaly Extremities: extremities normal, atraumatic, no cyanosis or edema Pulses: 2+ and symmetric Skin: Skin color, texture, turgor normal. No rashes or lesions Neurologic: Grossly normal Psych: Pleasant  LABS: Results for orders placed or performed during the hospital encounter of 12/03/15 (from the past 48 hour(s))  Brain natriuretic peptide     Status: Abnormal   Collection Time: 12/03/15  9:47 PM  Result Value Ref Range   B Natriuretic Peptide 211.7 (H) 0.0 - 100.0 pg/mL  CBC with Differential     Status: Abnormal   Collection Time: 12/03/15  9:50 PM  Result Value Ref Range   WBC 5.3 4.0 - 10.5 K/uL   RBC 3.70 (L) 3.87 - 5.11 MIL/uL   Hemoglobin 12.6 12.0 - 15.0 g/dL   HCT 37.6 36.0 - 46.0 %   MCV 101.6 (H) 78.0 - 100.0 fL   MCH 34.1 (H)  26.0 - 34.0 pg   MCHC 33.5 30.0 - 36.0 g/dL   RDW 14.7 11.5 - 15.5 %   Platelets 132 (L) 150 - 400 K/uL   Neutrophils Relative % 61 %   Neutro Abs 3.3 1.7 - 7.7 K/uL   Lymphocytes Relative 26 %   Lymphs Abs 1.4 0.7 - 4.0 K/uL   Monocytes  Relative 11 %   Monocytes Absolute 0.6 0.1 - 1.0 K/uL   Eosinophils Relative 1 %   Eosinophils Absolute 0.0 0.0 - 0.7 K/uL   Basophils Relative 1 %   Basophils Absolute 0.0 0.0 - 0.1 K/uL  Basic metabolic panel     Status: Abnormal   Collection Time: 12/03/15  9:50 PM  Result Value Ref Range   Sodium 136 135 - 145 mmol/L   Potassium 3.7 3.5 - 5.1 mmol/L   Chloride 101 101 - 111 mmol/L   CO2 28 22 - 32 mmol/L   Glucose, Bld 105 (H) 65 - 99 mg/dL   BUN 10 6 - 20 mg/dL   Creatinine, Ser 0.49 0.44 - 1.00 mg/dL   Calcium 9.3 8.9 - 10.3 mg/dL   GFR calc non Af Amer >60 >60 mL/min   GFR calc Af Amer >60 >60 mL/min    Comment: (NOTE) The eGFR has been calculated using the CKD EPI equation. This calculation has not been validated in all clinical situations. eGFR's persistently <60 mL/min signify possible Chronic Kidney Disease.    Anion gap 7 5 - 15  Troponin I     Status: Abnormal   Collection Time: 12/03/15  9:50 PM  Result Value Ref Range   Troponin I 0.24 (H) <0.031 ng/mL    Comment:        PERSISTENTLY INCREASED TROPONIN VALUES IN THE RANGE OF 0.04-0.49 ng/mL CAN BE SEEN IN:       -UNSTABLE ANGINA       -CONGESTIVE HEART FAILURE       -MYOCARDITIS       -CHEST TRAUMA       -ARRYHTHMIAS       -LATE PRESENTING MYOCARDIAL INFARCTION       -COPD   CLINICAL FOLLOW-UP RECOMMENDED.   I-Stat Troponin, ED (not at [facility])     Status: Abnormal   Collection Time: 12/03/15  9:58 PM  Result Value Ref Range   Troponin i, poc 0.26 (HH) 0.00 - 0.08 ng/mL   Comment NOTIFIED PHYSICIAN    Comment 3            Comment: Due to the release kinetics of cTnI, a negative result within the first hours of the onset of symptoms does not rule out myocardial infarction with certainty. If myocardial infarction is still suspected, repeat the test at appropriate intervals.   Troponin I     Status: Abnormal   Collection Time: 12/04/15  2:00 AM  Result Value Ref Range   Troponin I 1.13 (HH) <0.031  ng/mL    Comment:        POSSIBLE MYOCARDIAL ISCHEMIA. SERIAL TESTING RECOMMENDED. CRITICAL RESULT CALLED TO, READ BACK BY AND VERIFIED WITH: Max Sane RN [id number] 12/04/15 A NAVARRO   Troponin I     Status: Abnormal   Collection Time: 12/04/15  8:23 AM  Result Value Ref Range   Troponin I 0.67 (HH) <0.031 ng/mL    Comment:        POSSIBLE MYOCARDIAL ISCHEMIA. SERIAL TESTING RECOMMENDED. CRITICAL VALUE NOTED.  VALUE IS CONSISTENT WITH PREVIOUSLY REPORTED AND CALLED VALUE.  Basic metabolic panel     Status: Abnormal   Collection Time: 12/04/15  8:23 AM  Result Value Ref Range   Sodium 139 135 - 145 mmol/L   Potassium 3.6 3.5 - 5.1 mmol/L   Chloride 101 101 - 111 mmol/L   CO2 29 22 - 32 mmol/L   Glucose, Bld 104 (H) 65 - 99 mg/dL   BUN 10 6 - 20 mg/dL   Creatinine, Ser 0.55 0.44 - 1.00 mg/dL   Calcium 9.5 8.9 - 10.3 mg/dL   GFR calc non Af Amer >60 >60 mL/min   GFR calc Af Amer >60 >60 mL/min    Comment: (NOTE) The eGFR has been calculated using the CKD EPI equation. This calculation has not been validated in all clinical situations. eGFR's persistently <60 mL/min signify possible Chronic Kidney Disease.    Anion gap 9 5 - 15  CBC     Status: Abnormal   Collection Time: 12/04/15  8:23 AM  Result Value Ref Range   WBC 5.1 4.0 - 10.5 K/uL   RBC 3.90 3.87 - 5.11 MIL/uL   Hemoglobin 13.6 12.0 - 15.0 g/dL   HCT 40.3 36.0 - 46.0 %   MCV 103.3 (H) 78.0 - 100.0 fL   MCH 34.9 (H) 26.0 - 34.0 pg   MCHC 33.7 30.0 - 36.0 g/dL   RDW 14.9 11.5 - 15.5 %   Platelets 133 (L) 150 - 400 K/uL  Lipid panel     Status: Abnormal   Collection Time: 12/04/15  8:23 AM  Result Value Ref Range   Cholesterol 232 (H) 0 - 200 mg/dL   Triglycerides 68 <150 mg/dL   HDL 70 >40 mg/dL   Total CHOL/HDL Ratio 3.3 RATIO   VLDL 14 0 - 40 mg/dL   LDL Cholesterol 148 (H) 0 - 99 mg/dL    Comment:        Total Cholesterol/HDL:CHD Risk Coronary Heart Disease Risk Table                     Men   Women  1/2  Average Risk   3.4   3.3  Average Risk       5.0   4.4  2 X Average Risk   9.6   7.1  3 X Average Risk  23.4   11.0        Use the calculated Patient Ratio above and the CHD Risk Table to determine the patient's CHD Risk.        ATP III CLASSIFICATION (LDL):  <100     mg/dL   Optimal  100-129  mg/dL   Near or Above                    Optimal  130-159  mg/dL   Borderline  160-189  mg/dL   High  >190     mg/dL   Very High Performed at [place]     Status: None   Collection Time: 12/04/15  8:23 AM  Result Value Ref Range   Prothrombin Time 14.2 11.6 - 15.2 seconds   INR 1.08 0.00 - 1.49  APTT     Status: Abnormal   Collection Time: 12/04/15  8:23 AM  Result Value Ref Range   aPTT 194 (H) 24 - 37 seconds    Comment:        IF BASELINE aPTT IS ELEVATED, SUGGEST PATIENT RISK ASSESSMENT BE USED TO DETERMINE APPROPRIATE  ANTICOAGULANT THERAPY. SPECIMEN CHECKED FOR CLOTS REPEATED TO VERIFY   Heparin level (unfractionated)     Status: None   Collection Time: 12/04/15  1:46 PM  Result Value Ref Range   Heparin Unfractionated 0.55 0.30 - 0.70 IU/mL    Comment:        IF HEPARIN RESULTS ARE BELOW EXPECTED VALUES, AND PATIENT DOSAGE HAS BEEN CONFIRMED, SUGGEST FOLLOW UP TESTING OF ANTITHROMBIN III LEVELS.   Troponin I     Status: Abnormal   Collection Time: 12/04/15  1:47 PM  Result Value Ref Range   Troponin I 0.47 (H) <0.031 ng/mL    Comment:        PERSISTENTLY INCREASED TROPONIN VALUES IN THE RANGE OF 0.04-0.49 ng/mL CAN BE SEEN IN:       -UNSTABLE ANGINA       -CONGESTIVE HEART FAILURE       -MYOCARDITIS       -CHEST TRAUMA       -ARRYHTHMIAS       -LATE PRESENTING MYOCARDIAL INFARCTION       -COPD   CLINICAL FOLLOW-UP RECOMMENDED.     IMAGING: Dg Chest 2 View  12/04/2015  CLINICAL DATA:  Sudden onset of mid chest pain this evening. EXAM: CHEST  2 VIEW COMPARISON:  Chest radiograph 11/03/2015, chest CT 11/06/2015 FINDINGS: Tip of the  right chest port in the mid SVC. The cardiomediastinal contours are normal. Pulmonary vasculature is normal. No consolidation, pleural effusion, or pneumothorax. No acute osseous abnormalities are seen. Degenerative change seen in the thoracic spine. IMPRESSION: No acute pulmonary process. Electronically Signed   By: Jeb Levering M.D.   On: 12/04/2015 00:41    HOSPITAL DIAGNOSES: Principal Problem:   ACS (acute coronary syndrome) ([place]) Active Problems:   History of atrial fibrillation   Chemotherapy induced thrombocytopenia   Hypertension due to drug   Elevated troponin   Hypertensive urgency   IMPRESSION: 1. NSTEMI 2. History of atrial fibrillation on Rythmol 3. Hypertensive urgency/emergency  RECOMMENDATION: 1. NSTEMI- troponin is significantly elevated with a typical rise and fall of acute coronary syndrome. The troponin elevation was preceded by a typical left-sided chest pain which has resolved. She's currently on IV heparin. We will start a nitroglycerin drip due to significant persistent hypertension. I recommend transfer to [facility] for left heart catheterization tomorrow. I scheduled with Dr. Pernell Dupre at 12:00. She will need to be nothing by mouth after midnight for cardiac catheterization. 2. History of atrial fibrillation on Rythmol- we will discontinue Rythmol as it is contraindicated in the setting of ischemic heart disease. Continue beta blocker for rate control. 3. Hypertensive urgency/emergency- continue home oral antihypertensives. She has hydralazine when necessary for uncontrolled hypertension. We will start a nitroglycerin drip for better blood pressure control and treatment of angina.  Discussed with Dr. Wyline Copas. He will transfer to my service in a stepdown bed.  Time Spent Directly with Patient: 45 minutes  Pixie Casino, MD, [facility] Attending Cardiologist Start 12/04/2015, 3:33 PM

## 2015-12-04 NOTE — Progress Notes (Signed)
[facility] PROGRESS NOTE  [name] [id number] DOB: 03/09/1941 DOA: 12/03/2015 PCP: Precious Reel, MD  HPI/Brief narrative 75 y.o. 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 Patient was found to have markedly elevated BP and elevated troponin. Cardiology was consulted and hospitalist consulted for admission  Assessment/Plan: 1. ACS  - Serial troponins peaked to 1.13, now trending down - IV heparin bolus and infusion started according to ACS dosing, started after second trop returned with further elevation  - pt initially refused full-dose ASA, refused statin, beta-blocker, Plavix was given  - Cardiology was consulted consulted and I have personally discussed the case with Dr. Debara Pickett. Appreciate input. Essentially, concerns for an NSTEMI with recommendations to transfer to [facility] with plans for cardiac catheterization tomorrow. Patient will be transferred to stepdown level of care at [facility] the cardiology service.  2. Hypertensive urgency  - BP initially in 200/100 range  - Had been controlled with pt's home medications: Valsartan, aldactone, metoprolol  -Blood pressures initially improved with resuming home BP meds, however is now elevated again. -For now, will start nitroglycerin drip  3. Paroxysmal atrial fib - In sinus rhythm in ED  - Continue home propafenone as tolerated  - Pt on Plavix and ASA 81, not previously anticoagulated   4. Thrombocytopenia  - Likely chemo-induced, was 132 on admit  - Will trend, watching closely given heparin infusion   5. DVT prophylaxis - on heparin gtt  Code Status: Full Family Communication: Pt in room, family at bedside Disposition Plan: Transfer to stepdown level of care, [facility]   Consultants:  Cardiology  Procedures:    Antibiotics: Anti-infectives    None      HPI/Subjective: Patient currently denies chest pain or shortness of breath.  Objective: Filed Vitals:   12/04/15 1400 12/04/15 1501 12/04/15 1700 12/04/15 1734  BP: 142/85 182/108 175/92 177/94  Pulse: 43 64 63 63  Temp:      TempSrc:      Resp: 13 18 15 16   SpO2: 83% 96% 99% 98%    Intake/Output Summary (Last 24 hours) at 12/04/15 1802 Last data filed at 12/04/15 0915  Gross per 24 hour  Intake      0 ml  Output    201 ml  Net   -201 ml   There were no vitals filed for this visit.  Exam:   General:  Awake, no acute distress  Cardiovascular: Regular, S1-S2  Respiratory: Normal respiratory effort, no wheezing  Abdomen: Soft, nontender nondistended  Musculoskeletal: Perfused, no clubbing or cyanosis   Data Reviewed: Basic Metabolic Panel:  Recent Labs Lab 12/03/15 2150 12/04/15 0823  NA 136 139  K 3.7 3.6  CL 101 101  CO2 28 29  GLUCOSE 105* 104*  BUN 10 10  CREATININE 0.49 0.55  CALCIUM 9.3 9.5   Liver Function Tests: No results for input(s): AST, ALT, ALKPHOS, BILITOT, PROT, ALBUMIN in the last 168 hours. No results for input(s): LIPASE, AMYLASE in the last 168 hours. No results for input(s): AMMONIA in the last 168 hours. CBC:  Recent Labs Lab 12/03/15 2150 12/04/15 0823  WBC 5.3 5.1  NEUTROABS 3.3  --   HGB 12.6 13.6  HCT 37.6 40.3  MCV 101.6* 103.3*  PLT 132* 133*   Cardiac Enzymes:  Recent Labs Lab 12/03/15 2150 12/04/15 0200 12/04/15 0823 12/04/15  1347  TROPONINI 0.24* 1.13* 0.67* 0.47*   BNP (last 3 results)  Recent Labs  12/03/15 2147  BNP 211.7*    ProBNP (last 3 results) No results for input(s): PROBNP in the last 8760 hours.  CBG: No results for input(s): GLUCAP in the last 168 hours.  No results found for this or any previous visit (from the past 240 hour(s)).   Studies: Dg Chest 2 View  12/04/2015   CLINICAL DATA:  Sudden onset of mid chest pain this evening. EXAM: CHEST  2 VIEW COMPARISON:  Chest radiograph 11/03/2015, chest CT 11/06/2015 FINDINGS: Tip of the right chest port in the mid SVC. The cardiomediastinal contours are normal. Pulmonary vasculature is normal. No consolidation, pleural effusion, or pneumothorax. No acute osseous abnormalities are seen. Degenerative change seen in the thoracic spine. IMPRESSION: No acute pulmonary process. Electronically Signed   By: Jeb Levering M.D.   On: 12/04/2015 00:41    Scheduled Meds: . aspirin  324 mg Oral Once  . aspirin EC  81 mg Oral q morning - 10a  . clopidogrel  75 mg Oral QHS  . docusate sodium  200 mg Oral BID  . estradiol  1 mg Oral Daily  . irbesartan  300 mg Oral Daily  . levothyroxine  88 mcg Oral QAC breakfast  . metoprolol tartrate  50 mg Oral BID  . multivitamin with minerals  1 tablet Oral q morning - 10a  . pantoprazole  40 mg Oral Daily  . spironolactone  25 mg Oral Daily  . zolpidem  5 mg Oral QHS   Continuous Infusions: . heparin 750 Units/hr (12/04/15 0421)  . nitroGLYCERIN 10 mcg/min (12/04/15 1726)    Principal Problem:   ACS (acute coronary syndrome) ([place]) Active Problems:   History of atrial fibrillation   Chemotherapy induced thrombocytopenia   Hypertension due to drug   Elevated troponin   Hypertensive urgency   NSTEMI (non-ST elevated myocardial infarction) ([place])   [facility], [place] [facility] Pager [phone]. If 7PM-7AM, please contact night-coverage at [email or web address], password [facility] 12/04/2015, 6:02 PM  LOS: 1 day

## 2015-12-04 NOTE — Progress Notes (Signed)
ANTICOAGULATION CONSULT NOTE - Initial Consult  Pharmacy Consult for Heparin Indication: chest pain/ACS  Allergies  Allergen Reactions  . Coreg [Carvedilol] Shortness Of Breath  . Ceftin [Cefuroxime Axetil]     Interferes with propafenone.    . Clonidine Derivatives   . Codeine Other (See Comments)    Does not like the feeling she gets  . Flexeril [Cyclobenzaprine] Other (See Comments)    Pt states "increased heart rate"  . Levaquin [Levofloxacin In D5w]     Interferes with propafenone.   . Lipitor [Atorvastatin] Dermatitis    "feels like bugs are biting her"   . Lisinopril     LIP NUMBNESS  . Tegaderm Ag Mesh [Silver]   . Tape Rash    TEGADERM.   (use opsite on PAC)  . Ultram [Tramadol] Palpitations    Patient Measurements:   Heparin Dosing Weight:   Vital Signs: Temp: 97.8 F (36.6 C) (01/04 2047) Temp Source: Oral (01/04 2047) BP: 136/88 mmHg (01/05 0505) Pulse Rate: 57 (01/05 0505)  Labs:  Recent Labs  12/03/15 2150 12/04/15 0200  HGB 12.6  --   HCT 37.6  --   PLT 132*  --   CREATININE 0.49  --   TROPONINI 0.24* 1.13*    Estimated Creatinine Clearance: 52.6 mL/min (by C-G formula based on Cr of 0.49).   Medical History: Past Medical History  Diagnosis Date  . Hypertension   . Dyslipidemia   . Hypothyroidism   . Arthritis     OSTEOARTHRITIS   -- CONSTANT PAIN RIGHT HIP---AND PAIN LEFT KNEE--PT STATES SHE GETS INJECTIONS INTO HER KNEE  . PAF (paroxysmal atrial fibrillation) (HCC)     Only on Plavix -- not full Anticoagulation per pt. request  . Complication of anesthesia     BLOOD PRESSURE DROPPED WITH NASAL SURGERY, ONE OF THE CARPAL TUNNEL REPAIRS AND DURING A COLONOSCOPY  . History of skin cancer   . Constipation   . Ovarian cancer ([place]) 01/2013    Recurrence since 2014/2016    Medications:  Infusions:  . heparin 750 Units/hr (12/04/15 0421)    Assessment: Pt with afib and ovarian cancer in ED with chest pain.  Noted elevated troponin.    Goal of Therapy:  Heparin level 0.3-0.7 units/ml Monitor platelets by anticoagulation protocol: Yes   Plan:  Heparin bolus 3000 units iv x1 Heparin drip at 750 units/hr Daily CBC Next heparin level at [place], Shea Stakes [place] 12/04/2015,6:56 AM

## 2015-12-04 NOTE — ED Notes (Signed)
Updated with admission status. Recliner chair provided for spouse.

## 2015-12-04 NOTE — Progress Notes (Addendum)
ANTICOAGULATION CONSULT NOTE - Follow Up Consult  Pharmacy Consult for heparin Indication: chest pain/ACS  Allergies  Allergen Reactions  . Coreg [Carvedilol] Shortness Of Breath  . Ceftin [Cefuroxime Axetil]     Interferes with propafenone.    . Clonidine Derivatives   . Codeine Other (See Comments)    Does not like the feeling she gets  . Flexeril [Cyclobenzaprine] Other (See Comments)    Pt states "increased heart rate"  . Levaquin [Levofloxacin In D5w]     Interferes with propafenone.   . Lipitor [Atorvastatin] Dermatitis    "feels like bugs are biting her"   . Lisinopril     LIP NUMBNESS  . Tegaderm Ag Mesh [Silver]   . Tape Rash    TEGADERM.   (use opsite on PAC)  . Ultram [Tramadol] Palpitations    Patient Measurements:   Heparin Dosing Weight: 61kg  Vital Signs: Temp: 98 F (36.7 C) (01/05 1153) Temp Source: Oral (01/05 1153) BP: 161/82 mmHg (01/05 1300) Pulse Rate: 56 (01/05 1300)  Labs:  Recent Labs  12/03/15 2150 12/04/15 0200 12/04/15 0823 12/04/15 1346 12/04/15 1347  HGB 12.6  --  13.6  --   --   HCT 37.6  --  40.3  --   --   PLT 132*  --  133*  --   --   APTT  --   --  194*  --   --   LABPROT  --   --  14.2  --   --   INR  --   --  1.08  --   --   HEPARINUNFRC  --   --   --  0.55  --   CREATININE 0.49  --  0.55  --   --   TROPONINI 0.24* 1.13* 0.67*  --  0.47*    CrCl cannot be calculated (Unknown ideal weight.).  Assessment: 75 YOF presents with chest pain and positive troponin.  She has history of afib on plavix/ASA prior admission  Today's labs, 12/04/2015:  Initial heparin level therapeutic  CBC this morning acceptable but note thrombocytopenia   Goal of Therapy:  Heparin level 0.3-0.7 units/ml Monitor platelets by anticoagulation protocol: Yes   Plan:   Continue heparin 750 units/hr  Confirmatory level at 20:30  Daily heparin level and CBC  Watch platelets  Doreene Eland, PharmD, BCPS.   Pager: [phone] 12/04/2015  2:51 PM

## 2015-12-04 NOTE — H&P (Signed)
[facility] History and Physical  MARON ENTWISLE [id number] DOB: 06-28-1941 DOA: 12/03/2015  Referring physician: ED physician PCP: Precious Reel, MD  Specialists: Dr. Fermin Schwab (onc), Dr. Ellyn Hack (cards)   Chief Complaint: Chest pain   HPI: [name] is a 75 y.o. 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 Patient was in her usual state of health, preparing a meal in the kitchen, when she developed a central chest pressure with radiation to the left scapula, described as moderate in intensity, constant and resolving by the time of her arrival. She denied any associated diaphoresis, nausea, vomiting, or palpitations. There was some associated dyspnea. Patient states that she took her blood pressure during the episode and found to be 210/100. She is followed by cardiologist Dr. Ellyn Hack in the outpatient setting and describes similar symptoms previously when she was first diagnosed with atrial fibrillation.  In ED, patient was found to be hypertensive to 210/110, with borderline bradycardia. 12-lead EKG reveals ST depression across contiguous precordial leads and chest x-ray was negative for acute cardiopulmonary disease. Initial blood work returned notable for a troponin elevated to a value of 0.26 and the on-call cardiologist was consulted from the ED. Dr. Philbert Riser of cardiology kindly took the consult and recommended admission at [facility] for serial measurement of cardiac biomarkers and control of hypertension, with differential diagnosis favoring demand ischemia from hypertensive urgency/emergency.  Where does patient live?   At home    Can patient participate in ADLs?  Yes        Review of Systems:   General: no fevers, chills, sweats, or fatigue HEENT: no blurry vision, hearing changes or sore throat Pulm: no dyspnea, cough, or  wheeze CV: See HPI, no palpitations Abd: no nausea, vomiting, abdominal pain, diarrhea, or constipation GU: no dysuria, hematuria, increased urinary frequency, or urgency  Ext: no leg edema Neuro: no focal weakness, numbness, or tingling, no vision change or hearing loss Skin: no rash, no wounds MSK: No muscle spasm, no deformity, no red, hot, or swollen joint Heme: No easy bruising or bleeding Travel history: No recent long distant travel    Allergy:  Allergies  Allergen Reactions  . Coreg [Carvedilol] Shortness Of Breath  . Ceftin [Cefuroxime Axetil]     Interferes with propafenone.    . Clonidine Derivatives   . Codeine Other (See Comments)    Does not like the feeling she gets  . Flexeril [Cyclobenzaprine] Other (See Comments)    Pt states "increased heart rate"  . Levaquin [Levofloxacin In D5w]     Interferes with propafenone.   . Lisinopril     LIP NUMBNESS  . Tegaderm Ag Mesh [Silver]   . Tape Rash    TEGADERM.   (use opsite on PAC)  . Ultram [Tramadol] Palpitations    Past Medical History  Diagnosis Date  . Hypertension   . Dyslipidemia   . Hypothyroidism   . Arthritis     OSTEOARTHRITIS   -- CONSTANT PAIN RIGHT HIP---AND PAIN LEFT KNEE--PT STATES SHE GETS INJECTIONS INTO HER KNEE  . PAF (paroxysmal atrial fibrillation) (HCC)     Only on Plavix -- not full Anticoagulation per pt. request  . Complication of anesthesia     BLOOD PRESSURE DROPPED WITH NASAL SURGERY, ONE OF THE CARPAL TUNNEL REPAIRS AND DURING A COLONOSCOPY  . History of skin cancer   . Constipation   .  Ovarian cancer ([place]) 01/2013    Recurrence since 2014/2016    Past Surgical History  Procedure Laterality Date  . Bilateral carpal tunnel repair  2007  . Dilation and curettage of uterus  1969  . Surgery for ruptured ovarian cyst  1969  . Rhinoplasty for fractured nose  1986  . Left knee arthroscopy   2011  . Total hip arthroplasty  02/25/2012    Procedure: TOTAL HIP ARTHROPLASTY ANTERIOR  APPROACH;  Surgeon: Mcarthur Rossetti, MD;  Location: [facility];  Service: Orthopedics;  Laterality: Right;  . Tonsillectomy  1962  . Laparotomy Bilateral 02/13/2013    Procedure: EXPLORATORY LAPAROTOMY TOTAL ABDOMINAL HYSTERECTOMY BILATERAL SALPINGO-OOPHORECTOMY, Partial Rectal Resection with Reanastamosis;  Surgeon: Alvino Chapel, MD;  Location: [facility];  Service: Gynecology;  Laterality: Bilateral;  . Omentectomy  02/13/2013    Procedure: OMENTECTOMY;  Surgeon: Alvino Chapel, MD;  Location: [facility];  Service: Gynecology;;  . Lymphadenectomy Right 02/13/2013    Procedure: PEVLIC  LYMPHADENECTOMY, DEBULKING right pelvic tumor nodules;  Surgeon: Alvino Chapel, MD;  Location: [facility];  Service: Gynecology;  Laterality: Right;  . Total abdominal hysterectomy  01/2013  . Transthoracic echocardiogram  May 2014    Normal LV size function. EF 60-65%. Grade 1 diastolic function. Mild MR and mildly elevated PA pressures of 37 mmHg per  . Nm myoview ltd  June 2010     subbmaximal with no ischemia or infarction.  . Total knee arthroplasty Left 01/03/2015    Procedure: LEFT TOTAL KNEE ARTHROPLASTY;  Surgeon: Mcarthur Rossetti, MD;  Location: [facility];  Service: Orthopedics;  Laterality: Left;    Social History:  reports that she has never smoked. She has never used smokeless tobacco. She reports that she does not drink alcohol or use illicit drugs.  Family History:  Family History  Problem Relation Age of Onset  . Hypertension Mother   . Basal cell carcinoma Mother   . AAA (abdominal aortic aneurysm) Father   . Heart Problems Maternal Uncle   . Heart Problems Maternal Grandfather   . AAA (abdominal aortic aneurysm) Paternal Grandmother   . Prostate cancer Maternal Uncle 70  . Prostate cancer Other   . Other Daughter     one daughter had TAH-BSO at 61; other daughter will have one soon     Prior to Admission medications   Medication Sig Start Date End Date Taking?  Authorizing Provider  aspirin EC 81 MG tablet Take 81 mg by mouth every morning.    Yes Historical Provider, MD  clopidogrel (PLAVIX) 75 MG tablet TAKE 1 TABLET BY MOUTH AT BEDTIME 03/14/15  Yes Leonie Man, MD  desonide (DESOWEN) 0.05 % cream Apply 1 application topically 2 (two) times daily.  12/23/14  Yes Historical Provider, MD  diphenhydrAMINE (SOMINEX) 25 MG tablet Take 25-50 mg by mouth at bedtime as needed for sleep.   Yes Historical Provider, MD  docusate sodium (COLACE) 100 MG capsule Take 200 mg by mouth 2 (two) times daily.    Yes Historical Provider, MD  estradiol (ESTRACE) 1 MG tablet Take 1 tablet (1 mg total) by mouth every morning. PT STATES SHE WANTS ESTRACE--NOT THE GENERIC 03/28/15  Yes Marti Sleigh, MD  Glucosamine HCl (GLUCOSAMINE PO) Take 1,000 mg by mouth every morning.    Yes Historical Provider, MD  levothyroxine (SYNTHROID, LEVOTHROID) 88 MCG tablet Take 88 mcg by mouth every morning. Takes on an empty stomach. PT STATES SHE NEEDS THE SYNTHROID--DOES NOT WANT  THE GENERIC   Yes Historical Provider, MD  lidocaine-prilocaine (EMLA) cream Apply to Porta-Cath site 1-2 hours prior to access as directed. 04/22/15  Yes Lennis Marion Downer, MD  Melatonin 5 MG CAPS Take 1 capsule by mouth at bedtime as needed (sleep).    Yes Historical Provider, MD  Menthol, Topical Analgesic, (ICY HOT EX) Apply 1 application topically daily as needed (pain.).   Yes Historical Provider, MD  metoprolol tartrate (LOPRESSOR) 50 MG tablet Take 1 tablet (50 mg total) by mouth 2 (two) times daily. 11/06/15  Yes Leonie Man, MD  Multiple Vitamin (MULTIVITAMIN WITH MINERALS) TABS tablet Take 1 tablet by mouth every morning.    Yes Historical Provider, MD  ondansetron (ZOFRAN) 8 MG tablet Take 1 tablet (8 mg total) by mouth every 8 (eight) hours as needed for nausea or vomiting (Will not make drowsy). 07/11/15  Yes Lennis Marion Downer, MD  pantoprazole (PROTONIX) 40 MG tablet Take 1 tablet (40 mg total)  by mouth daily. 10/28/15  Yes Leonie Man, MD  Polyethyl Glycol-Propyl Glycol (SYSTANE OP) Place 1 drop into both eyes 2 (two) times daily.    Yes Historical Provider, MD  polyethylene glycol (MIRALAX / GLYCOLAX) packet Take 17 g by mouth every morning.    Yes Historical Provider, MD  potassium chloride (K-DUR) 10 MEQ tablet Take 10 mEq by mouth every Monday, Wednesday, and Friday. 05/05/13  Yes Shon Baton, MD  propafenone (RYTHMOL) 225 MG tablet TAKE 1 TABLET (225 MG TOTAL) BY MOUTH 2 (TWO) TIMES DAILY. 09/15/15  Yes Leonie Man, MD  spironolactone (ALDACTONE) 25 MG tablet Take 25 mg by mouth daily.  11/20/15  Yes Historical Provider, MD  valsartan (DIOVAN) 320 MG tablet TAKE 1 TABLET (320 MG TOTAL) BY MOUTH DAILY. 11/25/15  Yes Leonie Man, MD  amoxicillin (AMOXIL) 500 MG capsule Reported on 11/20/2015 02/26/15   Historical Provider, MD  bisacodyl (DULCOLAX) 5 MG EC tablet Take 5 mg by mouth daily as needed for moderate constipation. Reported on 11/20/2015    Historical Provider, MD  dexamethasone (DECADRON) 4 MG tablet Take 5 tabs with food 12 hrs and 6 hrs prior to taxol. Then take 1 tab with food every morning for the next 4 days. Patient not taking: Reported on 11/20/2015 10/13/15   Gordy Levan, MD  loratadine (CLARITIN) 10 MG tablet Take 10 mg by mouth daily as needed for allergies. Reported on 11/20/2015    Historical Provider, MD  LORazepam (ATIVAN) 1 MG tablet Place 1/2 -1 tablet under the tongue or swallow every 6 hrs as needed for nausea. Will make drowsy. 08/14/15   Lennis Marion Downer, MD  magnesium hydroxide (MILK OF MAGNESIA) 800 MG/5ML suspension Take 30 mLs by mouth daily as needed for constipation. Reported on 11/20/2015    Historical Provider, MD    Physical Exam: Filed Vitals:   12/03/15 2047 12/03/15 2148 12/03/15 2200  BP: 209/102 136/73 150/86  Pulse: 64 59 58  Temp: 97.8 F (36.6 C)    TempSrc: Oral    Resp: 18 18 14   SpO2: 100% 100% 98%   General: Not in  acute distress HEENT:       Eyes: PERRL, EOMI, no scleral icterus or conjunctival pallor.       ENT: No discharge from the ears or nose, no pharyngeal ulcers, petechiae or exudate, no tonsillar enlargement.        Neck: No JVD, no bruit, no appreciable mass Heme: No cervical adenopathy, no pallor  Cardiac: S1/S2, RRR, No murmurs, No gallops or rubs. Pulm: Good air movement bilaterally. No rales, wheezing, rhonchi or rubs. Abd: Soft, nondistended, nontender, no rebound pain or gaurding, no mass or organomegaly, BS present. Ext: No LE edema bilaterally. 2+DP/PT pulse bilaterally. Musculoskeletal: No gross deformity, no red, hot, swollen joints. Port in left chest, site c/d/i, non-tender    Skin: No rashes or wounds on exposed surfaces  Neuro: Alert, oriented X3, cranial nerves II-XII grossly intact. No focal findings Psych: Patient is not overtly psychotic, appropriate mood and affect.  Labs on Admission:  Basic Metabolic Panel:  Recent Labs Lab 12/03/15 2150  NA 136  K 3.7  CL 101  CO2 28  GLUCOSE 105*  BUN 10  CREATININE 0.49  CALCIUM 9.3   Liver Function Tests: No results for input(s): AST, ALT, ALKPHOS, BILITOT, PROT, ALBUMIN in the last 168 hours. No results for input(s): LIPASE, AMYLASE in the last 168 hours. No results for input(s): AMMONIA in the last 168 hours. CBC:  Recent Labs Lab 12/03/15 2150  WBC 5.3  NEUTROABS 3.3  HGB 12.6  HCT 37.6  MCV 101.6*  PLT 132*   Cardiac Enzymes:  Recent Labs Lab 12/03/15 2150  TROPONINI 0.24*    BNP (last 3 results) No results for input(s): BNP in the last 8760 hours.  ProBNP (last 3 results) No results for input(s): PROBNP in the last 8760 hours.  CBG: No results for input(s): GLUCAP in the last 168 hours.  Radiological Exams on Admission: No results found.  EKG: Independently reviewed.  Abnormal findings:  Sinus rhythm, ST-depression across contiguous precordial leads   Assessment/Plan  1. ACS  - Watch  on telemetry  - Serial troponins  - Repeat EKG in am, or STAT for CP recurrence  - IV heparin bolus and infusion according to ACS dosing, started after second trop returned with further elevation  - Nitro on hold for recurrence  - pt refused full-dose ASA, refused statin, beta-blocker, Plavix given  - Appreciate the assistance of cardiology consultant  - [facility] to [facility] if troponin continues to rise of CP persists   2. Hypertensive urgency  - BP initially in 200/100 range  - Controlled with pt's home medications: Valsartan, aldactone, metoprolol  - Monitor on telemetry   3. Paroxysmal atrial fib - In sinus rhythm in ED  - Continue home propafenone as tolerated  - Pt on Plavix and ASA 81, not anticoagulated   4. Thrombocytopenia  - Likely chemo-induced, was 132 on admit  - Will trend, watching closely given heparin infusion     DVT ppx: SQ Heparin     Code Status: Full code Family Communication:  Yes, patient's husband at bed side Disposition Plan: Admit to inpatient   Date of Service 12/04/2015    Vianne Bulls, MD [facility] Pager [phone]  If 7PM-7AM, please contact night-coverage [email or web address] Password [email or web address] 12/04/2015, 12:29 AM

## 2015-12-05 ENCOUNTER — Encounter (HOSPITAL_COMMUNITY): Payer: Self-pay | Admitting: Interventional Cardiology

## 2015-12-05 ENCOUNTER — Encounter (HOSPITAL_COMMUNITY)
Admission: EM | Disposition: A | Discharge status: Home / Self Care | Payer: Self-pay | Source: Home / Self Care | Attending: Internal Medicine

## 2015-12-05 DIAGNOSIS — I2510 Atherosclerotic heart disease of native coronary artery without angina pectoris: Secondary | ICD-10-CM

## 2015-12-05 DIAGNOSIS — R7989 Other specified abnormal findings of blood chemistry: Secondary | ICD-10-CM

## 2015-12-05 HISTORY — PX: CARDIAC CATHETERIZATION: SHX172

## 2015-12-05 LAB — MRSA PCR SCREENING: MRSA by PCR: NEGATIVE

## 2015-12-05 LAB — CBC
HCT: 37.3 % (ref 36.0–46.0)
HEMOGLOBIN: 12.5 g/dL (ref 12.0–15.0)
MCH: 34 pg (ref 26.0–34.0)
MCHC: 33.5 g/dL (ref 30.0–36.0)
MCV: 101.4 fL — AB (ref 78.0–100.0)
PLATELETS: 120 10*3/uL — AB (ref 150–400)
RBC: 3.68 MIL/uL — AB (ref 3.87–5.11)
RDW: 14.5 % (ref 11.5–15.5)
WBC: 5.9 10*3/uL (ref 4.0–10.5)

## 2015-12-05 LAB — GLUCOSE, CAPILLARY
GLUCOSE-CAPILLARY: 118 mg/dL — AB (ref 65–99)
Glucose-Capillary: 70 mg/dL (ref 65–99)

## 2015-12-05 LAB — CREATININE, SERUM
Creatinine, Ser: 0.53 mg/dL (ref 0.44–1.00)
GFR calc non Af Amer: >60 mL/min (ref >60)

## 2015-12-05 SURGERY — LEFT HEART CATH AND CORONARY ANGIOGRAPHY
Anesthesia: LOCAL

## 2015-12-05 MED ORDER — LIDOCAINE HCL (PF) 1 % IJ SOLN
INTRAMUSCULAR | Status: DC | PRN
  Administered 2015-12-05: 10:00:00

## 2015-12-05 MED ORDER — NITROGLYCERIN 1 MG/10 ML FOR IR/CATH LAB
INTRA_ARTERIAL | Status: DC | PRN
Start: 2015-12-05 — End: 2015-12-05
  Administered 2015-12-05: 200 ug via INTRACORONARY

## 2015-12-05 MED ORDER — ACETAMINOPHEN 325 MG PO TABS
650.0000 mg | ORAL_TABLET | ORAL | Status: DC | PRN
  Administered 2015-12-06: 650 mg via ORAL
  Filled 2015-12-05 (×3): qty 2

## 2015-12-05 MED ORDER — METOPROLOL TARTRATE 25 MG PO TABS
25.0000 mg | ORAL_TABLET | Freq: Two times a day (BID) | ORAL | Status: DC
  Administered 2015-12-05 – 2015-12-09 (×9): 25 mg via ORAL
  Filled 2015-12-05 (×9): qty 1

## 2015-12-05 MED ORDER — SODIUM CHLORIDE 0.9 % IJ SOLN
3.0000 mL | Freq: Two times a day (BID) | INTRAMUSCULAR | Status: DC
  Administered 2015-12-05 – 2015-12-06 (×4): 3 mL via INTRAVENOUS
  Administered 2015-12-07: 11:00:00 via INTRAVENOUS

## 2015-12-05 MED ORDER — HEPARIN (PORCINE) IN NACL 2-0.9 UNIT/ML-% IJ SOLN
INTRAMUSCULAR | Status: AC
  Filled 2015-12-05: qty 1000

## 2015-12-05 MED ORDER — FENTANYL CITRATE (PF) 100 MCG/2ML IJ SOLN
INTRAMUSCULAR | Status: AC
  Filled 2015-12-05: qty 2

## 2015-12-05 MED ORDER — NITROGLYCERIN 1 MG/10 ML FOR IR/CATH LAB
INTRA_ARTERIAL | Status: AC
  Filled 2015-12-05: qty 10

## 2015-12-05 MED ORDER — ONDANSETRON HCL 4 MG/2ML IJ SOLN: 4.0000 mg | Freq: Four times a day (QID) | INTRAMUSCULAR | Status: DC | PRN

## 2015-12-05 MED ORDER — SODIUM CHLORIDE 0.9 % IV SOLN: 250.0000 mL | INTRAVENOUS | Status: DC | PRN

## 2015-12-05 MED ORDER — FENTANYL CITRATE (PF) 100 MCG/2ML IJ SOLN
INTRAMUSCULAR | Status: DC | PRN
  Administered 2015-12-05: 50 ug via INTRAVENOUS

## 2015-12-05 MED ORDER — VERAPAMIL HCL 2.5 MG/ML IV SOLN
INTRAVENOUS | Status: AC
  Filled 2015-12-05: qty 2

## 2015-12-05 MED ORDER — METOPROLOL TARTRATE 25 MG PO TABS: 25.0000 mg | ORAL_TABLET | Freq: Two times a day (BID) | ORAL | Status: DC

## 2015-12-05 MED ORDER — LIDOCAINE HCL (PF) 1 % IJ SOLN
INTRAMUSCULAR | Status: AC
  Filled 2015-12-05: qty 30

## 2015-12-05 MED ORDER — DEXTROSE 50 % IV SOLN
25.0000 mL | Freq: Once | INTRAVENOUS | Status: AC
  Administered 2015-12-05: 25 mL via INTRAVENOUS
  Filled 2015-12-05: qty 50

## 2015-12-05 MED ORDER — HEPARIN SODIUM (PORCINE) 5000 UNIT/ML IJ SOLN
5000.0000 [IU] | Freq: Three times a day (TID) | INTRAMUSCULAR | Status: DC
  Administered 2015-12-06 – 2015-12-09 (×7): 5000 [IU] via SUBCUTANEOUS
  Filled 2015-12-05 (×10): qty 1

## 2015-12-05 MED ORDER — VERAPAMIL HCL 2.5 MG/ML IV SOLN
INTRAVENOUS | Status: DC | PRN
  Administered 2015-12-05: 09:00:00 via INTRA_ARTERIAL

## 2015-12-05 MED ORDER — HEPARIN SODIUM (PORCINE) 1000 UNIT/ML IJ SOLN
INTRAMUSCULAR | Status: DC | PRN
  Administered 2015-12-05: 3000 [IU] via INTRAVENOUS

## 2015-12-05 MED ORDER — ESTRADIOL 1 MG PO TABS
1.0000 mg | ORAL_TABLET | Freq: Every day | ORAL | Status: DC
  Administered 2015-12-05 – 2015-12-09 (×5): 1 mg via ORAL
  Filled 2015-12-05: qty 1

## 2015-12-05 MED ORDER — MIDAZOLAM HCL 2 MG/2ML IJ SOLN
INTRAMUSCULAR | Status: DC | PRN
  Administered 2015-12-05: 1 mg via INTRAVENOUS

## 2015-12-05 MED ORDER — HEPARIN SODIUM (PORCINE) 1000 UNIT/ML IJ SOLN
INTRAMUSCULAR | Status: AC
  Filled 2015-12-05: qty 1

## 2015-12-05 MED ORDER — MIDAZOLAM HCL 2 MG/2ML IJ SOLN
INTRAMUSCULAR | Status: AC
  Filled 2015-12-05: qty 2

## 2015-12-05 MED ORDER — AMLODIPINE BESYLATE 5 MG PO TABS
5.0000 mg | ORAL_TABLET | Freq: Every day | ORAL | Status: DC
  Administered 2015-12-05 – 2015-12-07 (×3): 5 mg via ORAL
  Filled 2015-12-05 (×3): qty 1

## 2015-12-05 MED ORDER — ROSUVASTATIN CALCIUM 20 MG PO TABS
40.0000 mg | ORAL_TABLET | Freq: Every day | ORAL | Status: DC
  Administered 2015-12-07 – 2015-12-08 (×2): 40 mg via ORAL
  Filled 2015-12-05 (×5): qty 2

## 2015-12-05 MED ORDER — SODIUM CHLORIDE 0.9 % WEIGHT BASED INFUSION: 1.0000 mL/kg/h | INTRAVENOUS | Status: DC

## 2015-12-05 MED ORDER — SODIUM CHLORIDE 0.9 % IJ SOLN: 3.0000 mL | INTRAMUSCULAR | Status: DC | PRN

## 2015-12-05 MED ORDER — IOHEXOL 350 MG/ML SOLN
INTRAVENOUS | Status: DC | PRN
  Administered 2015-12-05: 85 mL via INTRA_ARTERIAL

## 2015-12-05 MED FILL — Ondansetron HCl Inj 4 MG/2ML (2 MG/ML): INTRAMUSCULAR | Qty: 4 | Status: AC

## 2015-12-05 MED FILL — Fentanyl Citrate Preservative Free (PF) Inj 100 MCG/2ML: INTRAMUSCULAR | Qty: 2 | Status: AC

## 2015-12-05 SURGICAL SUPPLY — 10 items
CATH INFINITI 5 FR JL3.5 (CATHETERS) ×2 IMPLANT
CATH INFINITI JR4 5F (CATHETERS) ×2 IMPLANT
DEVICE RAD COMP TR BAND LRG (VASCULAR PRODUCTS) ×2 IMPLANT
GLIDESHEATH SLEND A-KIT 6F 22G (SHEATH) ×2 IMPLANT
KIT HEART LEFT (KITS) ×2 IMPLANT
PACK CARDIAC CATHETERIZATION (CUSTOM PROCEDURE TRAY) ×2 IMPLANT
TRANSDUCER W/STOPCOCK (MISCELLANEOUS) ×2 IMPLANT
TUBING CIL FLEX 10 FLL-RA (TUBING) ×2 IMPLANT
WIRE HI TORQ VERSACORE-J 145CM (WIRE) ×2 IMPLANT
WIRE SAFE-T 1.5MM-J .035X260CM (WIRE) ×2 IMPLANT

## 2015-12-05 NOTE — Care Management Important Message (Signed)
Important Message  Patient Details  Name: [name] MRN: [id number] Date of Birth: [birth year]   Medicare Important Message Given:  Yes    Barb Merino [facility] [place] 12/05/2015, 2:23 PM

## 2015-12-05 NOTE — Progress Notes (Addendum)
Discussed tentative plan with Dr. Debara Pickett who recommended I touch base with Dr. Tamala Julian to determine what plan is based on heart cath results (?medical therapy). Nurse raised concern about HR running in the 50s prior to administration of metoprolol 50mg  BID. Per d/w Dr. Debara Pickett, reduce dose to 25mg  BID and add amlodipine. Patient has history of several med intolerances but this has also occurred in the context of ongoing chemotherapy which the patient's husband reports has been hard on her. We will monitor for such intolerances in the hospital. Will touch base with Dr. Tamala Julian when he is free to review cath. Dayna Dunn PA-C   Addendum 2:35 PM: Discussed with Dr. Tamala Julian. No plans for PCI, recommend medical therapy for hypertensive heart disease. Nurse notes patient is sleepy post-cath. She's been this way ever since the procedure. Her husband report long history of taking a while to come out of anesthesia. She also became loopy here after phenergan overnight (had been up for 24 hours at that point as well). Neuro exam nonfocal. She is sleepy but easily arousable and remains awake for duration of conversation, A+Ox3, follows commands, oriented to situation, strength 5/5 equally LE/UE, no facial asymmetry, speech clear. She has headache but this was present prior to cath due to IV NTG. She also got intracoronary NTG by cath. Reviewed with Dr. Tamala Julian - suspect this is just slow clearing related to narcotics given for cath and thus will continue to monitor for now and allow her to wake up. CBG noted to be 70 thus will give 48ml D50. VSS except BP running elevated (but not unexpected since her oral metoprolol/amlodipine were delayed due to patient's sleepiness).  I discussed plan for close monitoring with nurse.  Dayna Dunn PA-C

## 2015-12-05 NOTE — Progress Notes (Signed)
Dr. Susy Manor called and discussed patient status. Orders obtained.

## 2015-12-05 NOTE — Progress Notes (Signed)
Spoke to Melina Copa, NP concerning pt is very confused and lethargic still from cath lab.  Has headache.  Also, BP elevated and unable to remove air from TR band because site oozes.  NP came to bedside to assess pt.  See NP note.

## 2015-12-05 NOTE — Progress Notes (Signed)
DAILY PROGRESS NOTE  Subjective:  No events overnight. Slept well. Headache this morning. BP improved on nitro gtts. No chest pain. For LHC today.  Objective:  Temp:  [97.4 F (36.3 C)-98 F (36.7 C)] 97.8 F (36.6 C) (01/06 0400) Pulse Rate:  [43-75] 55 (01/06 0500) Resp:  [11-20] 12 (01/06 0500) BP: (122-190)/(74-108) 122/74 mmHg (01/06 0500) SpO2:  [83 %-100 %] 97 % (01/06 0500) Weight:  [128 lb 1.4 oz (58.1 kg)-129 lb 3 oz (58.6 kg)] 128 lb 1.4 oz (58.1 kg) (01/06 0400) Weight change:   Intake/Output from previous day: 01/05 0701 - 01/06 0700 In: 390 [I.V.:390] Out: 801 [Urine:800; Emesis/NG output:1]  Intake/Output from this shift:    Medications: Current Facility-Administered Medications  Medication Dose Route Frequency Provider Last Rate Last Dose  . 0.9 %  sodium chloride infusion  250 mL Intravenous PRN Pixie Casino, MD      . 0.9% sodium chloride infusion  1 mL/kg/hr Intravenous Continuous Pixie Casino, MD      . acetaminophen (TYLENOL) tablet 650 mg  650 mg Oral Q4H PRN Vianne Bulls, MD   650 mg at 12/04/15 1831  . aspirin chewable tablet 324 mg  324 mg Oral Once Vianne Bulls, MD   324 mg at 12/04/15 0108  . aspirin EC tablet 81 mg  81 mg Oral q morning - 10a Vianne Bulls, MD   81 mg at 12/04/15 1333  . bisacodyl (DULCOLAX) EC tablet 5 mg  5 mg Oral Daily PRN Vianne Bulls, MD      . clopidogrel (PLAVIX) tablet 75 mg  75 mg Oral QHS Vianne Bulls, MD   75 mg at 12/05/15 0048  . docusate sodium (COLACE) capsule 200 mg  200 mg Oral BID Vianne Bulls, MD   200 mg at 12/05/15 0042  . estradiol (ESTRACE) tablet 1 mg  1 mg Oral Daily Vianne Bulls, MD   1 mg at 12/04/15 1100  . heparin ADULT infusion 100 units/mL (25000 units/250 mL)  750 Units/hr Intravenous Continuous Vianne Bulls, MD 7.5 mL/hr at 12/05/15 0613 750 Units/hr at 12/05/15 0613  . hydrALAZINE (APRESOLINE) injection 10 mg  10 mg Intravenous Q4H PRN Donne Hazel, MD      . irbesartan  (AVAPRO) tablet 300 mg  300 mg Oral Daily Vianne Bulls, MD   300 mg at 12/04/15 1102  . levothyroxine (SYNTHROID, LEVOTHROID) tablet 88 mcg  88 mcg Oral QAC breakfast Vianne Bulls, MD   88 mcg at 12/04/15 1112  . loratadine (CLARITIN) tablet 10 mg  10 mg Oral Daily PRN Ilene Qua Opyd, MD      . LORazepam (ATIVAN) tablet 1 mg  1 mg Oral Q6H PRN Ilene Qua Opyd, MD      . magnesium hydroxide (MILK OF MAGNESIA) suspension 30 mL  30 mL Oral Daily PRN Ilene Qua Opyd, MD      . metoprolol tartrate (LOPRESSOR) tablet 50 mg  50 mg Oral BID Vianne Bulls, MD   50 mg at 12/05/15 0048  . morphine 2 MG/ML injection 2 mg  2 mg Intravenous Q3H PRN Jules Husbands, MD   2 mg at 12/04/15 2252  . multivitamin with minerals tablet 1 tablet  1 tablet Oral q morning - 10a Vianne Bulls, MD   Stopped at 12/04/15 1126  . nitroGLYCERIN 50 mg in dextrose 5 % 250 mL (0.2 mg/mL) infusion  0-200 mcg/min Intravenous Titrated  Donne Hazel, MD 1.5 mL/hr at 12/05/15 0600 5 mcg/min at 12/05/15 0600  . ondansetron (ZOFRAN) injection 4 mg  4 mg Intravenous Q6H PRN Ilene Qua Opyd, MD      . pantoprazole (PROTONIX) EC tablet 40 mg  40 mg Oral Daily Vianne Bulls, MD   40 mg at 12/04/15 1113  . polyethylene glycol (MIRALAX / GLYCOLAX) packet 17 g  17 g Oral Daily PRN Vianne Bulls, MD      . promethazine (PHENERGAN) injection 12.5 mg  12.5 mg Intravenous Q4H PRN Jules Husbands, MD   12.5 mg at 12/04/15 2253  . sodium chloride 0.9 % injection 10-40 mL  10-40 mL Intracatheter PRN Ilene Qua Opyd, MD      . sodium chloride 0.9 % injection 3 mL  3 mL Intravenous Q12H Pixie Casino, MD   3 mL at 12/04/15 2230  . sodium chloride 0.9 % injection 3 mL  3 mL Intravenous PRN Pixie Casino, MD      . spironolactone (ALDACTONE) tablet 25 mg  25 mg Oral Daily Vianne Bulls, MD   25 mg at 12/04/15 1102  . zolpidem (AMBIEN) tablet 5 mg  5 mg Oral QHS Vianne Bulls, MD   5 mg at 12/04/15 9417    Physical Exam: General appearance: alert and  no distress Neck: no carotid bruit and no JVD Lungs: clear to auscultation bilaterally Heart: regular rate and rhythm, S1, S2 normal, no murmur, click, rub or gallop Abdomen: soft, non-tender; bowel sounds normal; no masses,  no organomegaly Extremities: extremities normal, atraumatic, no cyanosis or edema Pulses: 2+ and symmetric Skin: Skin color, texture, turgor normal. No rashes or lesions Neurologic: Grossly normal  Lab Results: Results for orders placed or performed during the hospital encounter of 12/03/15 (from the past 48 hour(s))  Brain natriuretic peptide     Status: Abnormal   Collection Time: 12/03/15  9:47 PM  Result Value Ref Range   B Natriuretic Peptide 211.7 (H) 0.0 - 100.0 pg/mL  CBC with Differential     Status: Abnormal   Collection Time: 12/03/15  9:50 PM  Result Value Ref Range   WBC 5.3 4.0 - 10.5 K/uL   RBC 3.70 (L) 3.87 - 5.11 MIL/uL   Hemoglobin 12.6 12.0 - 15.0 g/dL   HCT 37.6 36.0 - 46.0 %   MCV 101.6 (H) 78.0 - 100.0 fL   MCH 34.1 (H) 26.0 - 34.0 pg   MCHC 33.5 30.0 - 36.0 g/dL   RDW 14.7 11.5 - 15.5 %   Platelets 132 (L) 150 - 400 K/uL   Neutrophils Relative % 61 %   Neutro Abs 3.3 1.7 - 7.7 K/uL   Lymphocytes Relative 26 %   Lymphs Abs 1.4 0.7 - 4.0 K/uL   Monocytes Relative 11 %   Monocytes Absolute 0.6 0.1 - 1.0 K/uL   Eosinophils Relative 1 %   Eosinophils Absolute 0.0 0.0 - 0.7 K/uL   Basophils Relative 1 %   Basophils Absolute 0.0 0.0 - 0.1 K/uL  Basic metabolic panel     Status: Abnormal   Collection Time: 12/03/15  9:50 PM  Result Value Ref Range   Sodium 136 135 - 145 mmol/L   Potassium 3.7 3.5 - 5.1 mmol/L   Chloride 101 101 - 111 mmol/L   CO2 28 22 - 32 mmol/L   Glucose, Bld 105 (H) 65 - 99 mg/dL   BUN 10 6 - 20 mg/dL   Creatinine,  Ser 0.49 0.44 - 1.00 mg/dL   Calcium 9.3 8.9 - 10.3 mg/dL   GFR calc non Af Amer >60 >60 mL/min   GFR calc Af Amer >60 >60 mL/min    Comment: (NOTE) The eGFR has been calculated using the CKD EPI  equation. This calculation has not been validated in all clinical situations. eGFR's persistently <60 mL/min signify possible Chronic Kidney Disease.    Anion gap 7 5 - 15  Troponin I     Status: Abnormal   Collection Time: 12/03/15  9:50 PM  Result Value Ref Range   Troponin I 0.24 (H) <0.031 ng/mL    Comment:        PERSISTENTLY INCREASED TROPONIN VALUES IN THE RANGE OF 0.04-0.49 ng/mL CAN BE SEEN IN:       -UNSTABLE ANGINA       -CONGESTIVE HEART FAILURE       -MYOCARDITIS       -CHEST TRAUMA       -ARRYHTHMIAS       -LATE PRESENTING MYOCARDIAL INFARCTION       -COPD   CLINICAL FOLLOW-UP RECOMMENDED.   I-Stat Troponin, ED (not at [facility])     Status: Abnormal   Collection Time: 12/03/15  9:58 PM  Result Value Ref Range   Troponin i, poc 0.26 (HH) 0.00 - 0.08 ng/mL   Comment NOTIFIED PHYSICIAN    Comment 3            Comment: Due to the release kinetics of cTnI, a negative result within the first hours of the onset of symptoms does not rule out myocardial infarction with certainty. If myocardial infarction is still suspected, repeat the test at appropriate intervals.   Troponin I     Status: Abnormal   Collection Time: 12/04/15  2:00 AM  Result Value Ref Range   Troponin I 1.13 (HH) <0.031 ng/mL    Comment:        POSSIBLE MYOCARDIAL ISCHEMIA. SERIAL TESTING RECOMMENDED. CRITICAL RESULT CALLED TO, READ BACK BY AND VERIFIED WITH: Max Sane RN [id number] 12/04/15 A NAVARRO   Troponin I     Status: Abnormal   Collection Time: 12/04/15  8:23 AM  Result Value Ref Range   Troponin I 0.67 (HH) <0.031 ng/mL    Comment:        POSSIBLE MYOCARDIAL ISCHEMIA. SERIAL TESTING RECOMMENDED. CRITICAL VALUE NOTED.  VALUE IS CONSISTENT WITH PREVIOUSLY REPORTED AND CALLED VALUE.   Basic metabolic panel     Status: Abnormal   Collection Time: 12/04/15  8:23 AM  Result Value Ref Range   Sodium 139 135 - 145 mmol/L   Potassium 3.6 3.5 - 5.1 mmol/L   Chloride 101 101 - 111 mmol/L   CO2  29 22 - 32 mmol/L   Glucose, Bld 104 (H) 65 - 99 mg/dL   BUN 10 6 - 20 mg/dL   Creatinine, Ser 0.55 0.44 - 1.00 mg/dL   Calcium 9.5 8.9 - 10.3 mg/dL   GFR calc non Af Amer >60 >60 mL/min   GFR calc Af Amer >60 >60 mL/min    Comment: (NOTE) The eGFR has been calculated using the CKD EPI equation. This calculation has not been validated in all clinical situations. eGFR's persistently <60 mL/min signify possible Chronic Kidney Disease.    Anion gap 9 5 - 15  CBC     Status: Abnormal   Collection Time: 12/04/15  8:23 AM  Result Value Ref Range   WBC 5.1 4.0 - 10.5  K/uL   RBC 3.90 3.87 - 5.11 MIL/uL   Hemoglobin 13.6 12.0 - 15.0 g/dL   HCT 40.3 36.0 - 46.0 %   MCV 103.3 (H) 78.0 - 100.0 fL   MCH 34.9 (H) 26.0 - 34.0 pg   MCHC 33.7 30.0 - 36.0 g/dL   RDW 14.9 11.5 - 15.5 %   Platelets 133 (L) 150 - 400 K/uL  Lipid panel     Status: Abnormal   Collection Time: 12/04/15  8:23 AM  Result Value Ref Range   Cholesterol 232 (H) 0 - 200 mg/dL   Triglycerides 68 <150 mg/dL   HDL 70 >40 mg/dL   Total CHOL/HDL Ratio 3.3 RATIO   VLDL 14 0 - 40 mg/dL   LDL Cholesterol 148 (H) 0 - 99 mg/dL    Comment:        Total Cholesterol/HDL:CHD Risk Coronary Heart Disease Risk Table                     Men   Women  1/2 Average Risk   3.4   3.3  Average Risk       5.0   4.4  2 X Average Risk   9.6   7.1  3 X Average Risk  23.4   11.0        Use the calculated Patient Ratio above and the CHD Risk Table to determine the patient's CHD Risk.        ATP III CLASSIFICATION (LDL):  <100     mg/dL   Optimal  100-129  mg/dL   Near or Above                    Optimal  130-159  mg/dL   Borderline  160-189  mg/dL   High  >190     mg/dL   Very High Performed at [facility]     Status: None   Collection Time: 12/04/15  8:23 AM  Result Value Ref Range   Prothrombin Time 14.2 11.6 - 15.2 seconds   INR 1.08 0.00 - 1.49  APTT     Status: Abnormal   Collection Time: 12/04/15  8:23  AM  Result Value Ref Range   aPTT 194 (H) 24 - 37 seconds    Comment:        IF BASELINE aPTT IS ELEVATED, SUGGEST PATIENT RISK ASSESSMENT BE USED TO DETERMINE APPROPRIATE ANTICOAGULANT THERAPY. SPECIMEN CHECKED FOR CLOTS REPEATED TO VERIFY   Heparin level (unfractionated)     Status: None   Collection Time: 12/04/15  1:46 PM  Result Value Ref Range   Heparin Unfractionated 0.55 0.30 - 0.70 IU/mL    Comment:        IF HEPARIN RESULTS ARE BELOW EXPECTED VALUES, AND PATIENT DOSAGE HAS BEEN CONFIRMED, SUGGEST FOLLOW UP TESTING OF ANTITHROMBIN III LEVELS.   Troponin I     Status: Abnormal   Collection Time: 12/04/15  1:47 PM  Result Value Ref Range   Troponin I 0.47 (H) <0.031 ng/mL    Comment:        PERSISTENTLY INCREASED TROPONIN VALUES IN THE RANGE OF 0.04-0.49 ng/mL CAN BE SEEN IN:       -UNSTABLE ANGINA       -CONGESTIVE HEART FAILURE       -MYOCARDITIS       -CHEST TRAUMA       -ARRYHTHMIAS       -LATE PRESENTING MYOCARDIAL INFARCTION       -  COPD   CLINICAL FOLLOW-UP RECOMMENDED.   Heparin level (unfractionated)     Status: None   Collection Time: 12/04/15  9:12 PM  Result Value Ref Range   Heparin Unfractionated 0.55 0.30 - 0.70 IU/mL    Comment:        IF HEPARIN RESULTS ARE BELOW EXPECTED VALUES, AND PATIENT DOSAGE HAS BEEN CONFIRMED, SUGGEST FOLLOW UP TESTING OF ANTITHROMBIN III LEVELS.   MRSA PCR Screening     Status: None   Collection Time: 12/04/15 10:01 PM  Result Value Ref Range   MRSA by PCR NEGATIVE NEGATIVE    Comment:        The GeneXpert MRSA Assay (FDA approved for NASAL specimens only), is one component of a comprehensive MRSA colonization surveillance program. It is not intended to diagnose MRSA infection nor to guide or monitor treatment for MRSA infections.     Imaging: Dg Chest 2 View  12/04/2015  CLINICAL DATA:  Sudden onset of mid chest pain this evening. EXAM: CHEST  2 VIEW COMPARISON:  Chest radiograph 11/03/2015, chest CT  11/06/2015 FINDINGS: Tip of the right chest port in the mid SVC. The cardiomediastinal contours are normal. Pulmonary vasculature is normal. No consolidation, pleural effusion, or pneumothorax. No acute osseous abnormalities are seen. Degenerative change seen in the thoracic spine. IMPRESSION: No acute pulmonary process. Electronically Signed   By: Jeb Levering M.D.   On: 12/04/2015 00:41    Assessment:  1. Principal Problem: 2.   ACS (acute coronary syndrome) ([place]) 3. Active Problems: 4.   History of atrial fibrillation 5.   Chemotherapy induced thrombocytopenia 6.   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   Elevated troponin 8.   Hypertensive urgency 9.   NSTEMI (non-ST elevated myocardial infarction) ([place]) 10.   Plan:  1. Continue IV heparin and nitro. D/c nitro post-cath due to intolerance and will need further adjustment in bp meds. LDL 148. Start high dose crestor 40 mg tonight (she had intolerance to atorvastatin, which caused dermatitis).  Time Spent Directly with Patient:  15 minutes  Length of Stay:  LOS: 2 days   Pixie Casino, MD, [facility] Attending Cardiologist [place] 12/05/2015, 7:29 AM

## 2015-12-05 NOTE — Progress Notes (Signed)
Dr. Susy Manor paged x 2 regarding temp of 101 rectal. Tylenol 650mg  given. Second rectal temp of 100.4. Call not returned. Will continue to monitor patient and attempt to contact MD.

## 2015-12-05 NOTE — Progress Notes (Signed)
Notified Melina Copa, NP pt nauseated, cheeks flushed.  Mental status better.  Orders received. Will continue to monitor.

## 2015-12-05 NOTE — Care Management Note (Signed)
Case Management Note  Patient Details  Name: [name] MRN: [id number] Date of Birth: Jun 24, 1941  Subjective/Objective:      Adm w mi             Action/Plan:lives w fam, pcp dr Virgina Jock   Expected Discharge Date:   (unknown)               Expected Discharge Plan:     In-House Referral:     Discharge planning Services     Post Acute Care Choice:    Choice offered to:     DME Arranged:    DME Agency:     HH Arranged:    [place] Agency:     Status of Service:     Medicare Important Message Given:    Date Medicare IM Given:    Medicare IM give by:    Date Additional Medicare IM Given:    Additional Medicare Important Message give by:     If discussed at [place] of Stay Meetings, dates discussed:    Additional Comments:ur review done  Lacretia Leigh, RN 12/05/2015, 8:10 AM

## 2015-12-06 ENCOUNTER — Inpatient Hospital Stay (HOSPITAL_COMMUNITY): Payer: Medicare Other

## 2015-12-06 DIAGNOSIS — R509 Fever, unspecified: Secondary | ICD-10-CM

## 2015-12-06 LAB — GLUCOSE, CAPILLARY: Glucose-Capillary: 91 mg/dL (ref 65–99)

## 2015-12-06 LAB — BASIC METABOLIC PANEL
Anion gap: 9 (ref 5–15)
BUN: 7 mg/dL (ref 6–20)
CALCIUM: 8.7 mg/dL — AB (ref 8.9–10.3)
CO2: 25 mmol/L (ref 22–32)
CREATININE: 0.56 mg/dL (ref 0.44–1.00)
Chloride: 105 mmol/L (ref 101–111)
GFR calc Af Amer: >60 mL/min (ref >60)
GLUCOSE: 85 mg/dL (ref 65–99)
Potassium: 3 mmol/L — ABNORMAL LOW (ref 3.5–5.1)
SODIUM: 139 mmol/L (ref 135–145)

## 2015-12-06 LAB — URINALYSIS, ROUTINE W REFLEX MICROSCOPIC
GLUCOSE, UA: NEGATIVE mg/dL
Ketones, ur: 40 mg/dL — AB
Leukocytes, UA: NEGATIVE
Nitrite: NEGATIVE
PH: 6 (ref 5.0–8.0)
PROTEIN: 30 mg/dL — AB
Specific Gravity, Urine: 1.037 — ABNORMAL HIGH (ref 1.005–1.030)

## 2015-12-06 LAB — URINE MICROSCOPIC-ADD ON: RBC / HPF: NONE SEEN RBC/hpf (ref 0–5)

## 2015-12-06 LAB — CBC
HCT: 37.8 % (ref 36.0–46.0)
Hemoglobin: 12.4 g/dL (ref 12.0–15.0)
MCH: 33.5 pg (ref 26.0–34.0)
MCHC: 32.8 g/dL (ref 30.0–36.0)
MCV: 102.2 fL — AB (ref 78.0–100.0)
PLATELETS: 144 10*3/uL — AB (ref 150–400)
RBC: 3.7 MIL/uL — ABNORMAL LOW (ref 3.87–5.11)
RDW: 14.7 % (ref 11.5–15.5)
WBC: 8.4 10*3/uL (ref 4.0–10.5)

## 2015-12-06 LAB — INFLUENZA PANEL BY PCR (TYPE A & B)
H1N1 flu by pcr: NOT DETECTED
INFLAPCR: NEGATIVE
Influenza B By PCR: NEGATIVE

## 2015-12-06 MED ORDER — POTASSIUM CHLORIDE CRYS ER 10 MEQ PO TBCR
40.0000 meq | EXTENDED_RELEASE_TABLET | Freq: Once | ORAL | Status: AC
  Administered 2015-12-06: 40 meq via ORAL
  Filled 2015-12-06: qty 4
  Filled 2015-12-06: qty 2

## 2015-12-06 MED ORDER — SODIUM CHLORIDE 0.9 % IV SOLN
INTRAVENOUS | Status: DC
  Administered 2015-12-06: 13:00:00 via INTRAVENOUS
  Administered 2015-12-07: 50 mL/h via INTRAVENOUS

## 2015-12-06 NOTE — Progress Notes (Signed)
    Subjective:  Denies CP or dyspnea; Complains of headache and general malaise. She denies productive cough, dysuria, abdominal pain. No rash. Occasional loose stool.   Objective:  Filed Vitals:   12/06/15 0700 12/06/15 0800 12/06/15 0900 12/06/15 1000  BP: 167/85 156/89 176/79 165/87  Pulse: 80 84 82 68  Temp: 99.7 F (37.6 C) 100.9 F (38.3 C)    TempSrc: Oral Rectal    Resp: 16 19 9 13   Height:      Weight:      SpO2: 95% 95% 97% 96%    Intake/Output from previous day:  Intake/Output Summary (Last 24 hours) at 12/06/15 1050 Last data filed at 12/06/15 1000  Gross per 24 hour  Intake  702.4 ml  Output    851 ml  Net -148.6 ml    Physical Exam: Physical exam: Well-developed well-nourished in no acute distress.  Skin is warm and dry.  HEENT is normal.  Neck is supple.  Chest is clear to auscultation with normal expansion.  Cardiovascular exam is regular rate and rhythm.  Abdominal exam nontender or distended. No masses palpated. Extremities show no edema. Radial cath site without evidence of infection. neuro grossly intact    Lab Results: Basic Metabolic Panel:  Recent Labs  12/04/15 0823 12/05/15 1630 12/06/15 0417  NA 139  --  139  K 3.6  --  3.0*  CL 101  --  105  CO2 29  --  25  GLUCOSE 104*  --  85  BUN 10  --  7  CREATININE 0.55 0.53 0.56  CALCIUM 9.5  --  8.7*   CBC:  Recent Labs  12/03/15 2150  12/05/15 1630 12/06/15 0417  WBC 5.3  < > 5.9 8.4  NEUTROABS 3.3  --   --   --   HGB 12.6  < > 12.5 12.4  HCT 37.6  < > 37.3 37.8  MCV 101.6*  < > 101.4* 102.2*  PLT 132*  < > 120* 144*  < > = values in this interval not displayed. Cardiac Enzymes:  Recent Labs  12/04/15 0200 12/04/15 0823 12/04/15 1347  TROPONINI 1.13* 0.67* 0.47*     Assessment/Plan:  1 non-ST elevation myocardial infarction-the patient did rule in. Catheterization results noted. Per Dr. Tamala Julian plan medical therapy. Continue aspirin, Plavix, beta blocker and  statin. 2 hypertension-blood pressure remains elevated. Increase amlodipine to 10 mg daily. 3 fever-etiology unclear. No localizing signs or symptoms of infection. She does complain of headaches. Urine is unremarkable. Chest x-ray on January 5 negative. Question viral etiology. Will ask hospitalists to evaluate further. Note her last chemotherapy was in November. 4 Paroxysmal atrial fibrillation. Patient is in sinus rhythm. Continue beta blocker. Rythmol was discontinued given new documentation of coronary disease. She has not been on anticoagulation in the past. She will review this with Dr. Ellyn Hack at follow-up office visit. 5 ovarian cancer Kirk Ruths 12/06/2015, 10:50 AM

## 2015-12-06 NOTE — Progress Notes (Signed)
[name] APP Team - Progress Note  AISOSA VIELMA [id number] DOB: [birth year] DOA: 12/03/2015 PCP: Precious Reel, MD   Brief narrative: 75 year old female patient initially admitted on 1/5 by [place] at [facility] who presented with hypertensive urgency and bradycardia with abnormal EKG with ST depression. She also had mildly elevated troponin. Cardiology was consulted and patient was transferred to [facility] and subsequently taken to the Cath Lab for cardiac catheterization which revealed first diagonal lesion 95% stenosis, ostial second diagonal lesion 75% stenosed, distal LAD lesion 40% stenosed and proximal RCA to mid RCA lesion 40% stenosed. Medical therapy was planned and final diagnosis was non-STEMI. Patient required Versed and Fentanyl as well as local analgesia for sedation and was quite lethargic post procedure. She also required oxygen and subsequently spiked a temperature up to 101.6 F on 1/6 then again on 1/7. Blood cultures were obtained. In addition the patient began complaining of headache and generalized malaise without any associated cough abdominal pain dysuria. She has not had any further chest discomfort and her blood pressures remain well controlled. She denies shortness of breath. She reports nausea with early satiety. She also reports having 3-4 BMs in the past 24 hours that are not watery but not normal and are described as soft. 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 Taxol was also stopped.  HPI/Subjective: Primarily complaining of early satiety with mild nausea but no abdominal pain or vomiting. Continues to complain of generalized weakness and mild headache. Reports has not been out of bed yet but would like to be up to get to the bedside commode.  Assessment/Plan: Principal Problem:   ACS /  NSTEMI  -Management per cardiology  Active Problems:   Fever -Etiology unclear -Follow  up on blood cultures -No upper respiratory symptoms presents complaining of headache rule out viral etiology so we'll check influenza PCR as well as respiratory viral culture; patient did receive an influenza vaccine this year -Given MAXIMUM TEMPERATURE after patient was somewhat lethargic after cath and required oxygen high index of suspicion patient may have had occult aspiration double check a two-view chest x-ray -Urinalysis unremarkable regarding infection but does appear to be concentrated consistent with possible dehydration -Patient has had some looser than normal stools but is not having abdominal pain or leukocytosis and has not received antibiotics a low index of suspicion that she has C. 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/Antineoplastic chemotherapy induced pancytopenia  -Platelets are stable -Last chemotherapy November 22 and was stopped secondary to drug-induced hypertension (Avastin)    Dehydration -Patient with poor intake and early satiety since admission -Abdomen nontender nondistended and no nausea and vomiting -Hold Aldactone -Gentle IV fluid hydration with normal saline    Hypothyroidism -Continue Synthroid    PAF  -Maintaining sinus rhythm -cont metoprolol -Rythmol recently discontinued the setting of newly documented coronary artery disease -CHADVASc = 4 and per cardiology notes has not been on anticoagulation in the past and plans are for the patient to review this with her primary cardiologist Dr. 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 -Avastin was discontinued -Patient presented with hypertensive crisis which is now resolved but blood pressure still not at goal -Norvasc increased today to 10 mg -Continue Avapro/renal function  normal -Has prn IV hydralazine available   DVT prophylaxis: Subcutaneous heparin Code Status: Full code Family Communication: Husband and other family members at bedside Disposition Plan/Expected LOS: Anticipate discharge back to home environment and medically stable; recently transferred to stepdown level and hopeful can transition out to telemetry level in next 24 hours; PT evaluation pending   Consultants: Cardiology  Procedures: Left heart catheterization on 1/6  Cultures: Blood cultures obtained on 1/7 and are pending Influenza PCR pending Respiratory viral panel pending Urine culture pending  Antibiotics: None  Objective: Blood pressure 160/72, pulse 74, temperature 101.3 F (38.5 C), temperature source Rectal, resp. rate 16, height 5' 3.5" (1.613 m), weight 123 lb 3.8 oz (55.9 kg), SpO2 95 %.  Intake/Output Summary (Last 24 hours) at 12/06/15 1337 Last data filed at 12/06/15 1000  Gross per 24 hour  Intake  528.1 ml  Output    751 ml  Net -222.9 ml     Exam: Gen: No acute respiratory distress; appears chronically ill Chest: Clear to auscultation bilaterally without wheezes, rhonchi or crackles, room air Cardiac: Regular rate and rhythm, S1-S2, no rubs murmurs or gallops, no peripheral edema, no JVD Abdomen: Soft nontender nondistended without obvious hepatosplenomegaly, no ascites Extremities: Symmetrical in appearance without cyanosis, clubbing or effusion  Scheduled Meds:  Scheduled Meds: . amLODipine  5 mg Oral Daily  . aspirin  324 mg Oral Once  . aspirin EC  81 mg Oral q morning - 10a  . clopidogrel  75 mg Oral QHS  . docusate sodium  200 mg Oral BID  . estradiol  1 mg Oral Daily  . heparin  5,000 Units Subcutaneous 3 times per day  . irbesartan  300 mg Oral Daily  . levothyroxine  88 mcg Oral QAC breakfast  . metoprolol tartrate  25 mg Oral BID  . multivitamin with minerals  1 tablet Oral q morning - 10a  . pantoprazole  40 mg Oral Daily  .  potassium chloride  40 mEq Oral Once  . rosuvastatin  40 mg Oral q1800  . sodium chloride  3 mL Intravenous Q12H  . zolpidem  5 mg Oral QHS   Continuous Infusions: . sodium chloride 50 mL/hr at 12/06/15 1238    Data Reviewed: Basic Metabolic Panel:  Recent Labs Lab 12/03/15 2150 12/04/15 0823 12/05/15 1630 12/06/15 0417  NA 136 139  --  139  K 3.7 3.6  --  3.0*  CL 101 101  --  105  CO2 28 29  --  25  GLUCOSE 105* 104*  --  85  BUN 10 10  --  7  CREATININE 0.49 0.55 0.53 0.56  CALCIUM 9.3 9.5  --  8.7*   Liver Function Tests: No results for input(s): AST, ALT, ALKPHOS, BILITOT, PROT, ALBUMIN in the last 168 hours. No results for input(s): LIPASE, AMYLASE in the last 168 hours. No results for input(s): AMMONIA in the last 168 hours. CBC:  Recent Labs Lab 12/03/15 2150 12/04/15 0823 12/05/15 1630 12/06/15 0417  WBC 5.3 5.1 5.9 8.4  NEUTROABS 3.3  --   --   --   HGB 12.6 13.6 12.5 12.4  HCT 37.6 40.3 37.3 37.8  MCV 101.6* 103.3* 101.4* 102.2*  PLT 132* 133* 120* 144*   Cardiac Enzymes:  Recent Labs Lab 12/03/15 2150 12/04/15 0200 12/04/15 0823 12/04/15 1347  TROPONINI 0.24* 1.13* 0.67* 0.47*   BNP (last 3 results)  Recent Labs  12/03/15 2147  BNP 211.7*  ProBNP (last 3 results) No results for input(s): PROBNP in the last 8760 hours.  CBG:  Recent Labs Lab 12/05/15 1428 12/05/15 1519 12/05/15 2331  GLUCAP 70 118* 91    Recent Results (from the past 240 hour(s))  MRSA PCR Screening     Status: None   Collection Time: 12/04/15 10:01 PM  Result Value Ref Range Status   MRSA by PCR NEGATIVE NEGATIVE Final    Comment:        The GeneXpert MRSA Assay (FDA approved for NASAL specimens only), is one component of a comprehensive MRSA colonization surveillance program. It is not intended to diagnose MRSA infection nor to guide or monitor treatment for MRSA infections.      Studies:  Recent x-ray studies have been reviewed in detail  by the Attending Physician  Time spent :      Erin Hearing, [place] [facility] Office  [phone] Pager [phone]   **If unable to reach the above provider after paging please contact the [place] @ [phone]  On-Call/Text Page:      [email or web address]      password [email or web address]  If 7PM-7AM, please contact night-coverage [email or web address] Password [email or web address] 12/06/2015, 1:37 PM   LOS: 3 days   I have taken an interval history, reviewed the chart and examined the patient. I agree with the Advanced Practice Provider's note, impression and recommendations. I have made any necessary editorial changes. 24 -year-old female with history of hypertension, who was transferred to [name] for cardiac catheterization which showed first diagonal lesion 95% stenosis, ostial second diagonal lesion 75% stenosed, distal LAD lesion 40% stenosed and proximal RCA to mid RCA lesion 40% stenosed. Medical therapy was planned. Since cardiac cath patient had been quite lethargic. She required oxygen and spike a temperature of 101.6 Patient denies any cough or shortness of breath. Had 3 loose wall movements yesterday. Will check influenza PCR, repeat chest x-ray to rule out aspiration pneumonia. Will monitor.

## 2015-12-06 NOTE — Progress Notes (Signed)
I spoke with [facility] this am.  Kerin Ransom PA-C 12/06/2015 1:54 PM

## 2015-12-06 NOTE — Plan of Care (Signed)
Problem: Activity: Goal: Ability to tolerate increased activity will improve Outcome: Not Progressing Patient drowsy due to medications

## 2015-12-07 ENCOUNTER — Inpatient Hospital Stay (HOSPITAL_COMMUNITY): Payer: Medicare Other

## 2015-12-07 MED ORDER — AMLODIPINE BESYLATE 10 MG PO TABS
10.0000 mg | ORAL_TABLET | Freq: Every day | ORAL | Status: DC
  Administered 2015-12-08: 10 mg via ORAL
  Filled 2015-12-07: qty 1

## 2015-12-07 MED ORDER — AMIODARONE HCL 200 MG PO TABS
400.0000 mg | ORAL_TABLET | Freq: Two times a day (BID) | ORAL | Status: DC
  Administered 2015-12-07 – 2015-12-09 (×5): 400 mg via ORAL
  Filled 2015-12-07 (×5): qty 2

## 2015-12-07 MED ORDER — POTASSIUM CHLORIDE CRYS ER 20 MEQ PO TBCR
40.0000 meq | EXTENDED_RELEASE_TABLET | Freq: Once | ORAL | Status: AC
  Administered 2015-12-07: 40 meq via ORAL
  Filled 2015-12-07: qty 2

## 2015-12-07 MED ORDER — ONDANSETRON HCL 4 MG/2ML IJ SOLN: 4.0000 mg | INTRAMUSCULAR | Status: DC | PRN

## 2015-12-07 NOTE — Progress Notes (Signed)
Patient reports experiencing visual hallucinations, stated that room appears white and that over the course of the past few minutes she has visualized extra people in the room and at times is unable to see her husband who is sitting at her bedside. BP 130/76 P 75(SR with PACs) R 24 SaO2 96% on room air. Patient lying in bed in no obvious distress, oriented and able to answer questions appropriately.

## 2015-12-07 NOTE — Progress Notes (Signed)
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 Cardiology was consulted and patient was transferred to [facility] and subsequently taken to the Cath Lab for cardiac catheterization which revealed first diagonal lesion 95% stenosis, ostial second diagonal lesion 75% stenosed, distal LAD lesion 40% stenosed and proximal RCA to mid RCA lesion 40% stenosed. Medical therapy was planned and final diagnosis was NSTEMI. Patient required Versed and Fentanyl as well as local analgesia for sedation and was quite lethargic post procedure. She required oxygen support and subsequently spiked a temperature up to 101.6 F on 1/6 then again on 1/7. Blood cultures were obtained. In addition the patient began complaining of headache and generalized malaise without other localizing symptoms. 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 Taxol was also stopped.  HPI/Subjective: Pt is frustrated because she feels she is "hearing different things from different heart doctors."  She c/o visual disturbances, which she describes as seeing 2 or 3 of things at times, while at other times seeing things that are not there.  She denies ha, n/v, sob, or cp.  She denies other focal neurologic deficits.    Assessment/Plan:  FUO -Tmax recorded is 101.3 - on 1/7 - no elevated temp since that time - no clinical evidence of PNA and CXR unremarkable - flu panel negative - UA not c/w UTI - follow - ?unusual reaction to sedation  Visual disturbance Pt describes double  vision, but also describes visual hallucinations - no objective evidence on physical exam - ?part of delirium, or visual cortex issue - given recent cath will check MRI head to r/o acute CVA of posterior fossa  NSTEMI  -Management per Cardiology - s/p cath noting multifocal disease w/ plan for med tx w/ ASA, BB, palvix, statin  Constipation -Chronic problem since beginning chemotherapy - continue preadmission stool softeners and laxatives  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 -Last chemotherapy November 22 and was stopped secondary to drug-induced hypertension (Avastin) - had CT head w/ contrast 12/28 with no evidence of intracranial mets - doubt any of her current sx are related to this diagnosis  Chemotherapy induced pancytopenia  -appears to have resolved at this time   Dehydration -resolved - stop maintenance fluids and follow intake   Hypothyroidism -Continue Synthroid  Hypokalemia  -replace and follow   PAF  -care as directed by Cardiology service   Hypertension  -Avastin was felt to be the cause and was discontinued -adjust tx further as BP still not at goal   Code Status: FULL Family Communication: spoke w/ son at bedside at length  Disposition Plan: SDU   Consultants: [facility] Card[facility]  Procedures: L heart cath 12/05/15  Antibiotics: none  DVT prophylaxis: SQ heparin  Objective: Blood pressure 166/107, pulse 93, temperature 98.4 F (36.9 C), temperature source Oral, resp. rate 10, height 5' 3.5" (1.613 m), weight 58 kg (127 lb 13.9 oz), SpO2 100 %.  Intake/Output Summary (Last 24 hours) at 12/07/15 1206 Last data filed at 12/07/15 1202  Gross per 24 hour  Intake 1428.33 ml  Output    975 ml  Net  453.33 ml   Exam: General: No acute respiratory distress - alert  Lungs: Clear to auscultation bilaterally without wheezes or crackles Cardiovascular: Regular rate without murmur gallop or rub normal S1 and S2 Abdomen: Nontender, nondistended, soft,  bowel sounds positive, no rebound, no ascites, no appreciable mass Extremities: No significant cyanosis, clubbing, or edema bilateral lower extremities Neuro:  Subtly confused but alert, CN2-12 intact B, 5/5 strength B U & LE  Data Reviewed:  Basic Metabolic Panel:  Recent Labs Lab 12/03/15 2150 12/04/15 0823 12/05/15 1630 12/06/15 0417  NA 136 139  --  139  K 3.7 3.6  --  3.0*  CL 101 101  --  105  CO2 28 29  --  25  GLUCOSE 105* 104*  --  85  BUN 10 10  --  7  CREATININE 0.49 0.55 0.53 0.56  CALCIUM 9.3 9.5  --  8.7*    CBC:  Recent Labs Lab 12/03/15 2150 12/04/15 0823 12/05/15 1630 12/06/15 0417  WBC 5.3 5.1 5.9 8.4  NEUTROABS 3.3  --   --   --   HGB 12.6 13.6 12.5 12.4  HCT 37.6 40.3 37.3 37.8  MCV 101.6* 103.3* 101.4* 102.2*  PLT 132* 133* 120* 144*   Liver Function Tests: No results for input(s): AST, ALT, ALKPHOS, BILITOT, PROT, ALBUMIN in the last 168 hours. No results for input(s): LIPASE, AMYLASE in the last 168 hours. No results for input(s): AMMONIA in the last 168 hours.  Coags:  Recent Labs Lab 12/04/15 0823  INR 1.08    Recent Labs Lab 12/04/15 0823  APTT 194*    Cardiac Enzymes:  Recent Labs Lab 12/03/15 2150 12/04/15 0200 12/04/15 0823 12/04/15 1347  TROPONINI 0.24* 1.13* 0.67* 0.47*    CBG:  Recent Labs Lab 12/05/15 1428 12/05/15 1519 12/05/15 2331  GLUCAP 70 118* 91    Recent Results (from the past 240 hour(s))  MRSA PCR Screening     Status: None   Collection Time: 12/04/15 10:01 PM  Result Value Ref Range Status   MRSA by PCR NEGATIVE NEGATIVE Final    Comment:        The GeneXpert MRSA Assay (FDA approved for NASAL specimens only), is one component of a comprehensive MRSA colonization surveillance program. It is not intended to diagnose MRSA infection nor to guide or monitor treatment for MRSA infections.   Culture, blood (routine x 2)     Status: None (Preliminary result)   Collection Time: 12/06/15  12:36 AM  Result Value Ref Range Status   Specimen Description BLOOD LEFT ARM  Final   Special Requests BOTTLES DRAWN AEROBIC AND ANAEROBIC 5ML  Final   Culture NO GROWTH 1 DAY  Final   Report Status PENDING  Incomplete  Culture, blood (routine x 2)     Status: None (Preliminary result)   Collection Time: 12/06/15  1:10 AM  Result Value Ref Range Status   Specimen Description BLOOD LEFT ARM  Final   Special Requests BOTTLES DRAWN AEROBIC AND ANAEROBIC 5ML  Final   Culture NO GROWTH 1 DAY  Final   Report Status PENDING  Incomplete     Studies:   Recent x-ray studies have been reviewed in detail by the Attending Physician  Scheduled Meds:  Scheduled Meds: . amiodarone  400 mg Oral BID  . amLODipine  5 mg Oral Daily  . aspirin  324 mg Oral Once  . aspirin EC  81 mg Oral q morning - 10a  .  clopidogrel  75 mg Oral QHS  . docusate sodium  200 mg Oral BID  . estradiol  1 mg Oral Daily  . heparin  5,000 Units Subcutaneous 3 times per day  . irbesartan  300 mg Oral Daily  . levothyroxine  88 mcg Oral QAC breakfast  . metoprolol tartrate  25 mg Oral BID  . multivitamin with minerals  1 tablet Oral q morning - 10a  . pantoprazole  40 mg Oral Daily  . rosuvastatin  40 mg Oral q1800  . sodium chloride  3 mL Intravenous Q12H  . zolpidem  5 mg Oral QHS    Time spent on care of this patient: 35 mins   Anahid Eskelson T , MD   [facility]  [phone] Pager - Text Page per [facility] as per below:  On-Call/Text Page:      [email or web address]      password [email or web address]  If 7PM-7AM, please contact night-coverage [email or web address] Password [email or web address] 12/07/2015, 12:06 PM   LOS: 4 days

## 2015-12-07 NOTE — Progress Notes (Signed)
    Subjective:  Denies CP or dyspnea; Headache improved. Complains of visual hallucinations but no other neurological disturbance. Has palpitations with PAT/short runs of A. Fib.   Objective:  Filed Vitals:   12/07/15 0600 12/07/15 0700 12/07/15 0721 12/07/15 0800  BP: 120/107 173/85 130/76 175/78  Pulse: 65 63 65 66  Temp:   98 F (36.7 C)   TempSrc:   Oral   Resp: 17 11 21 17   Height:      Weight:      SpO2: 95% 100% 98% 97%    Intake/Output from previous day:  Intake/Output Summary (Last 24 hours) at 12/07/15 0923 Last data filed at 12/07/15 0600  Gross per 24 hour  Intake 1218.33 ml  Output    625 ml  Net 593.33 ml    Physical Exam: Physical exam: Well-developed well-nourished in no acute distress.  Skin is warm and dry.  HEENT is normal.  Neck is supple.  Chest is clear to auscultation with normal expansion.  Cardiovascular exam is regular rate and rhythm.  Abdominal exam nontender or distended. No masses palpated. Extremities show no edema. Radial cath site without evidence of infection. neuro grossly intact    Lab Results: Basic Metabolic Panel:  Recent Labs  12/05/15 1630 12/06/15 0417  NA  --  139  K  --  3.0*  CL  --  105  CO2  --  25  GLUCOSE  --  85  BUN  --  7  CREATININE 0.53 0.56  CALCIUM  --  8.7*   CBC:  Recent Labs  12/05/15 1630 12/06/15 0417  WBC 5.9 8.4  HGB 12.5 12.4  HCT 37.3 37.8  MCV 101.4* 102.2*  PLT 120* 144*   Cardiac Enzymes:  Recent Labs  12/04/15 1347  TROPONINI 0.47*     Assessment/Plan:  1 non-ST elevation myocardial infarction-the patient did rule in. Catheterization results noted. Per Dr. Tamala Julian plan medical therapy. Continue aspirin, Plavix, beta blocker and statin. 2 hypertension-continue present meds and follow 3 fever-etiology unclear; improved. No localizing signs or symptoms of infection. Urine is unremarkable. Chest x-ray negative. Question viral etiology. Follow-up cultures. 4 Paroxysmal  atrial fibrillation. Patient is in sinus rhythm but having runs of PAT/brief PAF. Continue beta blocker. Rythmol was discontinued given new documentation of coronary disease. She is symptomatic with her atrial fibrillation. I will begin amiodarone 400 mg twice a day to help maintain sinus rhythm. I consider tikosyn but given her ovarian cancer and intermittent decreased by mouth intake I have elected not to start this. CHADSvasc 4. She would benefit from anticoagulation. However she is to have radiation for ovarian cancer in the near future. I would be concerned about the possibility of intra-abdominal bleeding following radiation. We will review this with oncology. We can begin anticoagulation in the future if they feel not contraindicated. Patient will need close follow-up with Dr. Ellyn Hack after discharge. 5 ovarian cancer 6 hallucinations-no localizing findings on neurological exam. Plan noncontrast head CT. Kirk Ruths 12/07/2015, 9:23 AM

## 2015-12-07 NOTE — Evaluation (Signed)
Physical Therapy Evaluation Patient Details Name: CLEORA SLOWEY MRN: [id number] DOB: Jun 14, 1941 Today's Date: 12/07/2015   History of Present Illness  75 year old female with a Hx of paroxysmal a-fib, HTN, and ovarian cancer stage IIIB on chemotherapy who was admitted on 1/5 by [facility] at [facility] with hypertensive urgency and bradycardia with abnormal EKG (ST depression) and mildly elevated troponin.  Medical therapy was planned after cardiac cath and final diagnosis was NSTEMI. Pt spiked a temperature up to 101.6 F on 1/6 then again on 1/7 after cath. Blood cultures were obtained. In addition the patient began complaining of headache and generalized malaise without other localizing symptoms.  Clinical Impression  Pt admitted with above diagnosis. Pt currently with functional limitations due to the deficits listed below (see PT Problem List). Pt ambulated 250' with min-guard A and RW.  Pt will benefit from skilled PT to increase their independence and safety with mobility to allow discharge to the venue listed below.       Follow Up Recommendations No PT follow up    Equipment Recommendations  None recommended by PT    Recommendations for Other Services       Precautions / Restrictions Precautions Precautions: Fall Precaution Comments: droplet Restrictions Weight Bearing Restrictions: No      Mobility  Bed Mobility Overal bed mobility: Modified Independent                Transfers Overall transfer level: Needs assistance   Transfers: Sit to/from Stand Sit to Stand: Supervision         General transfer comment: pt stood and sat safely from bed and recliner  Ambulation/Gait Ambulation/Gait assistance: Min guard Ambulation Distance (Feet): 250 Feet Assistive device: Rolling walker (2 wheeled) Gait Pattern/deviations: Step-through pattern Gait velocity: decreased Gait velocity interpretation: Below normal speed for age/gender General Gait Details: pt  felt more comfortable with RW and helped with energy conservation, pt very happy to be OOB and moving. VSS throughout, min-guard A given for safety  Stairs            Wheelchair Mobility    Modified Rankin (Stroke Patients Only)       Balance Overall balance assessment: Needs assistance Sitting-balance support: No upper extremity supported Sitting balance-Leahy Scale: Good     Standing balance support: No upper extremity supported Standing balance-Leahy Scale: Fair Standing balance comment: more confident with UE support                             Pertinent Vitals/Pain Pain Assessment: No/denies pain  O2 sats 100%, HR 71 bpm    Home Living Family/patient expects to be discharged to:: Private residence Living Arrangements: Spouse/significant other Available Help at Discharge: Family;Available 24 hours/day Type of Home: House Home Access: Stairs to enter Entrance Stairs-Rails: Right Entrance Stairs-Number of Steps: 5+1 Home Layout: One level Home Equipment: Tub bench;Walker - 2 wheels Additional Comments: pt did not need equip prior to admission, had from prior TKA    Prior Function Level of Independence: Independent         Comments: pt had been undergoing chemotherapy but was stopped due to HTN issues     Hand Dominance   Dominant Hand: Right    Extremity/Trunk Assessment   Upper Extremity Assessment: Generalized weakness           Lower Extremity Assessment: Generalized weakness      Cervical / Trunk Assessment: Normal  Communication  Communication: No difficulties  Cognition Arousal/Alertness: Awake/alert Behavior During Therapy: WFL for tasks assessed/performed Overall Cognitive Status: Within Functional Limits for tasks assessed                      General Comments      Exercises        Assessment/Plan    PT Assessment Patient needs continued PT services  PT Diagnosis Difficulty walking;Generalized  weakness   PT Problem List Decreased strength;Decreased activity tolerance;Decreased mobility;Decreased balance;Decreased knowledge of use of DME;Cardiopulmonary status limiting activity  PT Treatment Interventions DME instruction;Gait training;Stair training;Functional mobility training;Therapeutic activities;Therapeutic exercise;Balance training;Patient/family education   PT Goals (Current goals can be found in the Care Plan section) Acute Rehab PT Goals Patient Stated Goal: return home PT Goal Formulation: With patient Time For Goal Achievement: 12/21/15 Potential to Achieve Goals: Good    Frequency Min 3X/week   Barriers to discharge        Co-evaluation               End of Session Equipment Utilized During Treatment: Gait belt Activity Tolerance: Patient tolerated treatment well Patient left: in chair;with call bell/phone within reach Nurse Communication: Mobility status         Time: 1458-1520 PT Time Calculation (min) (ACUTE ONLY): 22 min   Charges:   PT Evaluation $PT Eval Moderate Complexity: 1 Procedure     PT G Codes:      Leighton Roach, PT  Acute Rehab Services  [place], [place] 12/07/2015, 3:29 PM

## 2015-12-07 NOTE — Progress Notes (Signed)
Patient voiced concerns that multiple doctor that she has spoken with have given her a different diagnosis. She is hesitate to take medication that are prescribed. Eduction done, medication taken. Pharmacy to see pt also for education. Daughters at bedside and voiced their concerns about consistency with diagnosis.

## 2015-12-08 ENCOUNTER — Inpatient Hospital Stay (HOSPITAL_COMMUNITY): Payer: Medicare Other

## 2015-12-08 DIAGNOSIS — I63531 Cerebral infarction due to unspecified occlusion or stenosis of right posterior cerebral artery: Secondary | ICD-10-CM

## 2015-12-08 DIAGNOSIS — I639 Cerebral infarction, unspecified: Secondary | ICD-10-CM

## 2015-12-08 DIAGNOSIS — I6789 Other cerebrovascular disease: Secondary | ICD-10-CM

## 2015-12-08 DIAGNOSIS — I480 Paroxysmal atrial fibrillation: Secondary | ICD-10-CM

## 2015-12-08 DIAGNOSIS — I6330 Cerebral infarction due to thrombosis of unspecified cerebral artery: Secondary | ICD-10-CM | POA: Insufficient documentation

## 2015-12-08 LAB — BASIC METABOLIC PANEL
Anion gap: 7 (ref 5–15)
BUN: 6 mg/dL (ref 6–20)
CHLORIDE: 105 mmol/L (ref 101–111)
CO2: 27 mmol/L (ref 22–32)
Calcium: 9.3 mg/dL (ref 8.9–10.3)
Creatinine, Ser: 0.57 mg/dL (ref 0.44–1.00)
GFR calc non Af Amer: >60 mL/min (ref >60)
Glucose, Bld: 107 mg/dL — ABNORMAL HIGH (ref 65–99)
POTASSIUM: 4 mmol/L (ref 3.5–5.1)
SODIUM: 139 mmol/L (ref 135–145)

## 2015-12-08 LAB — CBC
HEMATOCRIT: 40.7 % (ref 36.0–46.0)
Hemoglobin: 13.5 g/dL (ref 12.0–15.0)
MCH: 34 pg (ref 26.0–34.0)
MCHC: 33.2 g/dL (ref 30.0–36.0)
MCV: 102.5 fL — AB (ref 78.0–100.0)
Platelets: 147 10*3/uL — ABNORMAL LOW (ref 150–400)
RBC: 3.97 MIL/uL (ref 3.87–5.11)
RDW: 14.4 % (ref 11.5–15.5)
WBC: 7 10*3/uL (ref 4.0–10.5)

## 2015-12-08 LAB — FOLATE: FOLATE: 35.7 ng/mL (ref >5.9)

## 2015-12-08 LAB — VITAMIN B12: Vitamin B-12: 1601 pg/mL — ABNORMAL HIGH (ref 180–914)

## 2015-12-08 LAB — MAGNESIUM: Magnesium: 1.9 mg/dL (ref 1.7–2.4)

## 2015-12-08 MED ORDER — SPIRONOLACTONE 25 MG PO TABS
25.0000 mg | ORAL_TABLET | Freq: Every day | ORAL | Status: DC
  Administered 2015-12-08 – 2015-12-09 (×2): 25 mg via ORAL
  Filled 2015-12-08 (×2): qty 1

## 2015-12-08 MED ORDER — AMLODIPINE BESYLATE 5 MG PO TABS
5.0000 mg | ORAL_TABLET | Freq: Every day | ORAL | Status: DC
  Administered 2015-12-09: 5 mg via ORAL
  Filled 2015-12-08: qty 1

## 2015-12-08 NOTE — Progress Notes (Addendum)
TELEMETRY: Reviewed telemetry pt in NSR with PACs, no Afib seen: Filed Vitals:   12/07/15 2000 12/07/15 2300 12/08/15 0440 12/08/15 0801  BP: 155/97 163/86 162/86 145/85  Pulse: 83   79  Temp: 97.5 F (36.4 C) 97.8 F (36.6 C) 97.6 F (36.4 C) 97.8 F (36.6 C)  TempSrc: Oral Oral Oral Oral  Resp: 18 17 13 22   Height:      Weight:   58.06 kg (128 lb)   SpO2: 100% 100% 99% 100%    Intake/Output Summary (Last 24 hours) at 12/08/15 0817 Last data filed at 12/08/15 0000  Gross per 24 hour  Intake   1170 ml  Output   1025 ml  Net    145 ml   Filed Weights   12/06/15 0400 12/07/15 0500 12/08/15 0440  Weight: 55.9 kg (123 lb 3.8 oz) 58 kg (127 lb 13.9 oz) 58.06 kg (128 lb)    Subjective Feels better today. No HA or visual changes. Denies chest pain or SOB. Frustrated with the lack of continuity in her cardiac care.   Marland Kitchen amiodarone  400 mg Oral BID  . amLODipine  10 mg Oral Daily  . aspirin EC  81 mg Oral q morning - 10a  . clopidogrel  75 mg Oral QHS  . docusate sodium  200 mg Oral BID  . estradiol  1 mg Oral Daily  . heparin  5,000 Units Subcutaneous 3 times per day  . irbesartan  300 mg Oral Daily  . levothyroxine  88 mcg Oral QAC breakfast  . metoprolol tartrate  25 mg Oral BID  . multivitamin with minerals  1 tablet Oral q morning - 10a  . pantoprazole  40 mg Oral Daily  . rosuvastatin  40 mg Oral q1800  . zolpidem  5 mg Oral QHS   . sodium chloride 10 mL/hr at 12/07/15 1445    LABS: Basic Metabolic Panel:  Recent Labs  12/06/15 0417 12/08/15 0345  NA 139 139  K 3.0* 4.0  CL 105 105  CO2 25 27  GLUCOSE 85 107*  BUN 7 6  CREATININE 0.56 0.57  CALCIUM 8.7* 9.3  MG  --  1.9   Liver Function Tests: No results for input(s): AST, ALT, ALKPHOS, BILITOT, PROT, ALBUMIN in the last 72 hours. No results for input(s): LIPASE, AMYLASE in the last 72 hours. CBC:  Recent Labs  12/06/15 0417 12/08/15 0345  WBC 8.4 7.0  HGB 12.4 13.5  HCT 37.8 40.7  MCV  102.2* 102.5*  PLT 144* 147*   Cardiac Enzymes: No results for input(s): CKTOTAL, CKMB, CKMBINDEX, TROPONINI in the last 72 hours. BNP: No results for input(s): PROBNP in the last 72 hours. D-Dimer: No results for input(s): DDIMER in the last 72 hours. Hemoglobin A1C: No results for input(s): HGBA1C in the last 72 hours. Fasting Lipid Panel: No results for input(s): CHOL, HDL, LDLCALC, TRIG, CHOLHDL, LDLDIRECT in the last 72 hours. Thyroid Function Tests: No results for input(s): TSH, T4TOTAL, T3FREE, THYROIDAB in the last 72 hours.  Invalid input(s): FREET3   Radiology/Studies:  Dg Chest 2 View  12/06/2015  CLINICAL DATA:  History of ovarian cancer.  Fever for 3 days. EXAM: CHEST  2 VIEW COMPARISON:  12/04/2015 FINDINGS: There is a right chest wall port a catheter with tip in the projection of the SVC. The heart size appears normal. There is no pleural effusion or edema could no airspace consolidation is identified. Spondylosis is present within the thoracic spine.  IMPRESSION: 1. No acute cardiopulmonary abnormalities. 2. Thoracic spondylosis noted. Electronically Signed   By: Kerby Moors M.D.   On: 12/06/2015 14:55   Mr Brain Wo Contrast  12/07/2015  CLINICAL DATA:  Visual disturbance. Double vision. Recent cardiac catheterization 12/05/2015. EXAM: MRI HEAD WITHOUT CONTRAST TECHNIQUE: Multiplanar, multiecho pulse sequences of the brain and surrounding structures were obtained without intravenous contrast. COMPARISON:  CT head 11/26/2015 FINDINGS: Small area of restricted diffusion in the right posterior temporal/ parietal lobe. Several small areas of restricted diffusion are present just above the tentorium. The largest measures 1 cm. Others measure 3-5 mm. There appears to be normal signal in the right transverse sinus without evidence of venous sinus thrombosis. No other areas of acute infarct. Generalized atrophy. Moderate chronic microvascular ischemic change in the white matter.  Brainstem and basal ganglia intact. No cerebellar infarct. Chronic micro hemorrhage left temporoparietal lobe. Micro hemorrhage left cerebellum. No hematoma. Negative for mass or edema.  No shift of the midline structures Paranasal sinuses clear. IMPRESSION: Small areas of acute infarct in the right temporoparietal lobe just above the tentorium. No evidence of venous sinus thrombosis. These may be embolic given the history of recent cardiac catheterization. Atrophy and chronic microvascular ischemia. Chronic micro hemorrhage left parietal lobe and left cerebellum likely due to chronic hypertension. Electronically Signed   By: Franchot Gallo M.D.   On: 12/07/2015 19:38    PHYSICAL EXAM General: Well developed, WF, in no acute distress. Head: Normocephalic, atraumatic, sclera non-icteric, oropharynx is clear Neck: Negative for carotid bruits. JVD not elevated. No adenopathy Lungs: Clear bilaterally to auscultation without wheezes, rales, or rhonchi. Breathing is unlabored. Heart: RRR S1 S2 without murmurs, rubs, or gallops.  Abdomen: Soft, non-tender, non-distended with normoactive bowel sounds.  Msk:  Strength and tone appears normal for age. Extremities: No clubbing, cyanosis or edema.  Distal pedal pulses are 2+ and equal bilaterally. Radial site without hematoma. Neuro: Alert and oriented X 3. Moves all extremities spontaneously. Psych:  Responds to questions appropriately with a normal affect.  ASSESSMENT AND PLAN: 1. Acute right posterior temporoparietal CVA. Symptoms resolved. Temporally related to cardiac cath procedure suggesting this may have been related to dislodgement of plaque during procedure. Other possibilities include hypertensive CVA or embolic related to afib. No sustained Afib seen in hospital. Echo and carotid dopplers today. Stroke team to see. Anticoagulation is a consideration but unclear what bleeding risk is with plans for RT for ovarian CA. On ASA and Plavix for now. OT and  PT to evaluate. 2. NSTEMI. Secondary to demand ischemia with small vessel CAD and severe HTN. No recurrent angina. Ecg shows nonspecific T wave flattening. Peak troponin 1.13. LV functio was normal at cath. With follow up Echo. On metoprolol and amlodipine. On DAPT for now.  3. Paroxysmal afib. Was on propafenone in past which is contraindicated with CAD. Now on amiodarone 400 mg bid. Would continue this dose for one week then reduce to daily. Eventually try and reduce dose to minimize side effects. Check TSH. 4. Hypertensive urgency. BP improved but still elevated. Now on irbesartan 300 mg daily, metoprolol 25 mg bid, and amlodipine 10 mg daily. Recommend resuming aldactone 25 mg daily.  5. Fever resolved.  6. Ovarian CA stage IIIB   Present on Admission:  . (Resolved) Elevated troponin . ACS (acute coronary syndrome) ([place]) . Hypertension due to drug . (Resolved) Hypertensive urgency . NSTEMI (non-ST elevated myocardial infarction) ([place]) . Hypothyroidism . PAF (paroxysmal atrial fibrillation) ([place]) . Fever .  Constipation . 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 Antineoplastic chemotherapy induced pancytopenia ([place]) . Dehydration  Signed, Peter Martinique, [place] 12/08/2015 8:17 AM  Addendum: Echo reviewed from today. There is a mobile growth in the LVOT that appears to be attached to the septum. It is very mobile. 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 This growth was also noted on Echo in May 2014. The report at that time recommended a TEE but this was never followed up on and the patient was never informed of this finding. Given her recent CVA I would recommend a TEE to characterize further. The earliest we could schedule would be Wednesday. If she is ready for DC prior to that we could arrange as an outpatient.   Peter Martinique MD, [facility]

## 2015-12-08 NOTE — Progress Notes (Addendum)
STROKE TEAM PROGRESS NOTE   HISTORY [name] is an 75 y.o. female with a past medical history significant for HTN HLD, PAF not taking anticoagulant, ovarian cancer stage IIIB on chemotherapy, initially admitted to [facility] on 1/5 with hypertensive urgency and NSTEMI. She underwent successful cardiac cath 12/05/15 via right radial artery and was noted to be very lethargic after procedure. Family is at the bedside and stated that shortly after cath they noticed that she was not able to see well. Patient is not quite sure if her vision declined afterwards but she tells me that yesterday she had a very peculiar vision change in which she will see her husband " sitting in a chair but also standing and walking from side to side". At the same time, she expressed that she couldn't see almost anything in her left peripheral vision. Stated that these changes lasted approximately 4 hours and then her vision returned to normal. Denies associated HA, vertigo, double vision, focal weakness or numbness, slurred speech, confusion, or language impairment. Her symptoms prompted MRI brain that revealed several small areas of acute infarct in the right posterior temporoparietal lobe just above the tentorium. On plavix and ASA 81 mg daily. Her last known well was uncertain. Patient was not administered TPA secondary to being out of the window.    SUBJECTIVE (INTERVAL HISTORY) Her husband and granddaughter are at the bedside.  Overall she feels her condition is stable. She was upset and complained to MD prior to [place] - concern she was not treated appropriately and very angry about her condition. This was not addressed during our rounds. She is up in the chair at the bedside with no complaints. She remains on droplet precautions.   OBJECTIVE Temp:  [97.5 F (36.4 C)-98.4 F (36.9 C)] 97.8 F (36.6 C) (01/09 0801) Pulse Rate:  [70-93] 79 (01/09 0801) Cardiac Rhythm:  [-] Normal sinus rhythm (01/09 0800) Resp:  [10-22] 22  (01/09 0801) BP: (145-166)/(85-107) 145/85 mmHg (01/09 0801) SpO2:  [99 %-100 %] 100 % (01/09 0801) Weight:  [58.06 kg (128 lb)] 58.06 kg (128 lb) (01/09 0440)  CBC:  Recent Labs Lab 12/03/15 2150  12/06/15 0417 12/08/15 0345  WBC 5.3  < > 8.4 7.0  NEUTROABS 3.3  --   --   --   HGB 12.6  < > 12.4 13.5  HCT 37.6  < > 37.8 40.7  MCV 101.6*  < > 102.2* 102.5*  PLT 132*  < > 144* 147*  < > = values in this interval not displayed.  Basic Metabolic Panel:  Recent Labs Lab 12/06/15 0417 12/08/15 0345  NA 139 139  K 3.0* 4.0  CL 105 105  CO2 25 27  GLUCOSE 85 107*  BUN 7 6  CREATININE 0.56 0.57  CALCIUM 8.7* 9.3  MG  --  1.9    Lipid Panel:    Component Value Date/Time   CHOL 232* 12/04/2015 0823   TRIG 68 12/04/2015 0823   HDL 70 12/04/2015 0823   CHOLHDL 3.3 12/04/2015 0823   VLDL 14 12/04/2015 0823   LDLCALC 148* 12/04/2015 0823   HgbA1c: No results found for: HGBA1C Urine Drug Screen: No results found for: LABOPIA, COCAINSCRNUR, LABBENZ, AMPHETMU, THCU, LABBARB    IMAGING  Dg Chest 2 View 12/06/2015   1. No acute cardiopulmonary abnormalities. 2. Thoracic spondylosis   Mr Brain Wo Contrast 12/07/2015   Small areas of acute infarct in the right temporoparietal lobe just above the tentorium. No evidence  of venous sinus thrombosis. These may be embolic given the history of recent cardiac catheterization. Atrophy and chronic microvascular ischemia. Chronic micro hemorrhage left parietal lobe and left cerebellum likely due to chronic hypertension.   Carotid Doppler   There is 1-39% bilateral ICA stenosis. Vertebral artery flow is antegrade.     PHYSICAL EXAM Elderly Caucasian lady not in distress.  . Afebrile. Head is nontraumatic. Neck is supple without bruit.    Cardiac exam no murmur or gallop. Lungs are clear to auscultation. Distal pulses are well felt. Neurological Exam ;  Awake  Alert oriented x 3. Normal speech and language.eye movements full without  nystagmus.fundi were not visualized. Vision acuity   appear normal.Visual fileds show partial lefthemianopsia Hearing is normal. Palatal movements are normal. Face symmetric. Tongue midline. Normal strength, tone, reflexes and coordination. Normal sensation. Gait deferred. ASSESSMENT/PLAN [name] is a 75 y.o. female with history of HTN HLD, PAF no taking anticoagulant, ovarian cancer stage IIIB on chemotherapy, initially admitted to [facility] on 1/5 with hypertensive urgency and NSTEMI who woke post cath with confusion. She did not receive IV t-PA due to unknown time last known well.   Stroke:  right posterior temporoparietal infarct embolic as a result of recent cardiac cath. She does have a known hx of PAF that is not thought to be related.  Resultant  Confusion cleared, pt with L field cut  Good memory with 3 minute recall and animal naming  MRI  Small infarcts R temporoparietal lobe  Carotid Doppler  No significant stenosis   2D Echo  pending   LDL 148  HgbA1c pending  Heparin 5000 units sq tid for VTE prophylaxis  Diet Heart Room service appropriate?: Yes; Fluid consistency:: Thin  aspirin 81 mg daily and clopidogrel 75 mg daily prior to admission, now on aspirin 81 mg daily and clopidogrel 75 mg daily  Patient counseled to be compliant with her antithrombotic medications  Ongoing aggressive stroke risk factor management  Therapy recommendations:  No PT, OP OT  Disposition:  Home with OP therapies  Patient advised not to drive at this time   Paroxysmal Atrial Fibrillation  Not active within the past 5 years  Home anticoagulation:  None (at request of pt) -  See note 06/25/2015 Dr. Ellyn Hack We compromised using aspirin plus Plavix for stroke prophylaxis as opposed to using full anticoagulation. Currently these medications on hold. 2 thrombocytopenia. She still is reluctant to go to full anticoagulation, especially while she is undergoing chemotherapy. I defer  starting and stopping of Plavix to her oncologist. Blood consider the possibility of having full anticoagulation with recurrence of malignancy.  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 (dermatitis)  Recommend the addition of statin at discharge  Other Stroke Risk Factors  Advanced age  Coronary artery  disease - ACS w/ NSTEMI this admission  Other Active Problems  Fever unknown origin  Constipation  ovarian CA w/ mets to speen & pelvis - chemo stopped due to drug-induced hypertension  Macrocytosis  Chemo induced pancytopenia  Dedyrhation, resolved  Hypothyroidism  hypokalemia  Hospital day # [place] for Pager information 12/08/2015 3:51 PM  I have personally examined this patient, reviewed notes, independently viewed imaging studies, participated in medical decision making and plan of care. I have made any additions or clarifications directly to the above note. Agree with note above. Patient developed left-sided visual symptoms and memory loss and confusion following cardiac cath likely due to a procedure related embolic in event. She has history of atrial fibrillation and a high CHAd2 VASc score of 6  requiring long-term anticoagulation. She remains at risk for recurrent stroke, TIA neurological worsening. I had a long discussion with the patient, husband  and granddaughter are at the bedside and answered questions.  Antony Contras, MD Medical Director [facility] Pager: [phone] 12/08/2015 4:13 PM   To contact Stroke Continuity provider, please refer to [email or web address] After hours, contact General Neurology

## 2015-12-08 NOTE — Care Management Important Message (Signed)
Important Message  Patient Details  Name: RAKELLE NONG MRN: [id number] Date of Birth: Jul 15, 1941   Medicare Important Message Given:  Yes    Lacretia Leigh, RN 12/08/2015, 1:57 PM

## 2015-12-08 NOTE — Progress Notes (Signed)
  Echocardiogram 2D Echocardiogram has been performed.  Darlina Sicilian M 12/08/2015, 11:23 AM

## 2015-12-08 NOTE — Progress Notes (Signed)
VASCULAR LAB PRELIMINARY  PRELIMINARY  PRELIMINARY  PRELIMINARY  Carotid duplex completed.    Preliminary report:  1-39% ICA plaquing.  Vertebral artery flow is antegrade.   Tremond Shimabukuro, RVT 12/08/2015, 9:52 AM

## 2015-12-08 NOTE — Evaluation (Signed)
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  Medical therapy was planned after cardiac cath and final diagnosis was NSTEMI. Pt spiked a temperature up to 101.6 F on 1/6 then again on 1/7 after cath. Blood cultures were obtained. In addition the patient began complaining of headache and generalized malaise without other localizing symptoms.12/07/15 MRI: Small areas of acute infarct in the right temporoparietal lobe just above the tentorium. Chronic micro hemorrhage left parietal lobe and left cerebellum likely due to chronic hypertension.   Clinical Impression   This 75 yo female admitted with above presents to acute OT with generalized weakness and left inferior homonymous quadranopsia affecting her PLOF of independent with basic ADLs and IADLs. She will benefit from OPOT if she/husband find that there are issues at home pertaining to her vision deficit--she seems to be compensating really well right now. She will need an outpatient OT script to take with her for follow up as they deem necessary.  I have advised her husband to watch her in kitchen from a safety standpoint and for her not to drive until her MD clears her to do so. Acute OT will sign off.    Follow Up Recommendations  Outpatient OT    Equipment Recommendations  None recommended by OT       Precautions / Restrictions Precautions Precautions: Fall (due to generalized weakness and Left inferior homonymous quadranopsia) Precaution Comments: droplet Restrictions Weight Bearing Restrictions: No      Mobility Bed Mobility Overal bed mobility:  Modified Independent                Transfers Overall transfer level: Needs assistance   Transfers: Sit to/from Stand Sit to Stand: Supervision         General transfer comment: S for all transfers and mobility due to left inferior homonymous hemianopsia; ambulated 2/3 of unit with pushing IV pole and last 1/3 without    Balance Overall balance assessment: Needs assistance Sitting-balance support: No upper extremity supported;Feet supported Sitting balance-Leahy Scale: Good     Standing balance support: No upper extremity supported Standing balance-Leahy Scale: Fair                              ADL Overall ADL's : Needs assistance/impaired                                       General ADL Comments: S only due to left inferior homonymous quandranopsia     Vision Vision Assessment?: Yes Eye Alignment: Within Functional Limits Ocular Range of Motion: Within Functional Limits Alignment/Gaze Preference: Within Defined Limits Tracking/Visual Pursuits: Able to track stimulus in all quads without difficulty Convergence: Within functional limits Visual Fields: Left inferior homonymous quadranopsia Additional Comments: Pt is compensating well with visual field cut with reading and ambulating around until while at the same time telling me all the room numbers on her left and right. I did educate her and her husband of the deficit and that my 2 biggest concerns would be  safety in kitchen and pt driving (would recommend that she not drive until MD clears her to do so)          Pertinent Vitals/Pain Pain Assessment: No/denies pain     Hand Dominance Right   Extremity/Trunk Assessment Upper Extremity Assessment Upper Extremity Assessment: Overall WFL for tasks assessed   Lower Extremity Assessment Lower Extremity Assessment: Generalized weakness (per pt she feels weak)   Cervical / Trunk Assessment Cervical / Trunk Assessment: Normal    Communication Communication Communication: No difficulties   Cognition Arousal/Alertness: Awake/alert Behavior During Therapy: WFL for tasks assessed/performed Overall Cognitive Status: Within Functional Limits for tasks assessed (however unaware of her left inferior homonymous quadranopsia)                                Home Living Family/patient expects to be discharged to:: Private residence Living Arrangements: Spouse/significant other Available Help at Discharge: Family;Available 24 hours/day Type of Home: House Home Access: Stairs to enter [employer] of Steps: 5+1 Entrance Stairs-Rails: Right Home Layout: One level     Bathroom Shower/Tub: Tub/shower unit;Curtain Shower/tub characteristics: Curtain       Home Equipment: Tub bench;Walker - 2 wheels;Grab bars - tub/shower;Adaptive equipment Adaptive Equipment: Reacher;Sock aid        Prior Functioning/Environment Level of Independence: Independent        Comments: pt had been undergoing chemotherapy but was stopped due to HTN issues    OT Diagnosis: Generalized weakness;Disturbance of vision   OT Problem List: Impaired vision/perception;Impaired balance (sitting and/or standing)      OT Goals(Current goals can be found in the care plan section) Acute Rehab OT Goals Patient Stated Goal: return home  OT Frequency:                End of Session Nurse Communication: Mobility status (vision deficits)  Activity Tolerance: Patient tolerated treatment well Patient left: in bed;with call bell/phone within reach (getting ready to have carotid doppler)   Time: [id number] OT Time Calculation (min): 42 min Charges:  OT General Charges $OT Visit: 1 Procedure OT Evaluation $OT Eval Moderate Complexity: 1 Procedure OT Treatments $Self Care/Home Management : 23-37 mins  Almon Register [id number] 12/08/2015, 11:07 AM

## 2015-12-08 NOTE — Progress Notes (Signed)
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 Cardiology was consulted and patient was transferred to [facility] and subsequently taken to the Cath Lab for cardiac catheterization which revealed first diagonal lesion 95% stenosis, ostial second diagonal lesion 75% stenosed, distal LAD lesion 40% stenosed and proximal RCA to mid RCA lesion 40% stenosed. Medical therapy was planned and final diagnosis was NSTEMI. Patient required Versed and Fentanyl as well as local analgesia for sedation and was quite lethargic post procedure. She required oxygen support and subsequently spiked a temperature up to 101.6 F on 1/6 then again on 1/7. Blood cultures were obtained. In addition the patient began complaining of headache and generalized malaise without other localizing symptoms. 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 Taxol was also stopped.  HPI/Subjective: The patient is resting comfortably this morning.  She denies chest pain shortness breath fevers chills nausea or vomiting.  She denies any further hallucinations or double vision.  Assessment/Plan:  FUO Tmax recorded is 101.3 on 1/7 - still no elevated temp since that time - no clinical evidence of PNA and CXR unremarkable - flu panel negative - UA not c/w UTI - follow - ?unusual reaction to sedation - appears to have resolved - resp virus panel still pending   Several small areas of acute infarction involving the right posterior temporoparietal region Felt to be due to  cardioembolism in the context of PAF or cardiac procedure related stroke - Stroke Team following and directing workup - Stroke Team to help decide if NOAC is appropriate   Visual disturbance Pt described double vision, but also visual hallucinations - sx have resolved at this time - ?delirium, or related to CVAs noted above  Hypertension  -Avastin was initially felt to be the cause and was discontinued - in setting of acute/subacute CVA, need to avoid overaggressive correction in the short term - follow w/o change in tx today   NSTEMI  -Management per Cardiology - s/p cath noting multifocal disease w/ plan for med tx w/ ASA, BB, palvix, statin  PAF  -care as directed by Cardiology service - NSR on tele presently   Constipation -Chronic problem since beginning chemotherapy - continue preadmission stool softeners and laxatives  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 -Last chemotherapy November 22 and was stopped secondary to drug-induced hypertension (Avastin) - had CT head w/ contrast 12/28 with no evidence of intracranial mets - doubt any of her current sx are related to this diagnosis - no masses noted on MRI brain as well   Macrocytosis  -B12 and folate normal/high   Chemotherapy induced pancytopenia  -appears to have resolved at this time   Dehydration -resolved    Hypothyroidism -Continue Synthroid - TSH pending   Hypokalemia  -corrected   Code Status: FULL Family Communication: spoke w/ pt and husband at length at bedside   Disposition Plan: SDU   Consultants: [facility] Stroke Team/Neuro   Procedures: L heart cath 12/05/15  Antibiotics: none  DVT prophylaxis: SQ heparin  Objective: Blood pressure 145/85, pulse 79, temperature 97.8 F (36.6 C), temperature source Oral,  resp. rate 22, height 5' 3.5" (1.613 m), weight 58.06 kg (128 lb), SpO2 100 %.  Intake/Output Summary (Last 24 hours) at 12/08/15 1009 Last data filed at 12/08/15 0800  Gross per 24  hour  Intake    885 ml  Output   1025 ml  Net   -140 ml   Exam: General: No acute respiratory distress - alert and pleasant  Lungs: Clear to auscultation bilaterally Cardiovascular: Regular rate without murmur gallop or rub  Abdomen: Nontender, nondistended, soft, bowel sounds positive, no rebound, no ascites, no appreciable mass Extremities: No significant cyanosis, clubbing, edema bilateral lower extremities Neuro:  Alert and oriented, CN2-12 intact B  Data Reviewed:  Basic Metabolic Panel:  Recent Labs Lab 12/03/15 2150 12/04/15 0823 12/05/15 1630 12/06/15 0417 12/08/15 0345  NA 136 139  --  139 139  K 3.7 3.6  --  3.0* 4.0  CL 101 101  --  105 105  CO2 28 29  --  25 27  GLUCOSE 105* 104*  --  85 107*  BUN 10 10  --  7 6  CREATININE 0.49 0.55 0.53 0.56 0.57  CALCIUM 9.3 9.5  --  8.7* 9.3  MG  --   --   --   --  1.9    CBC:  Recent Labs Lab 12/03/15 2150 12/04/15 0823 12/05/15 1630 12/06/15 0417 12/08/15 0345  WBC 5.3 5.1 5.9 8.4 7.0  NEUTROABS 3.3  --   --   --   --   HGB 12.6 13.6 12.5 12.4 13.5  HCT 37.6 40.3 37.3 37.8 40.7  MCV 101.6* 103.3* 101.4* 102.2* 102.5*  PLT 132* 133* 120* 144* 147*   Liver Function Tests: No results for input(s): AST, ALT, ALKPHOS, BILITOT, PROT, ALBUMIN in the last 168 hours. No results for input(s): LIPASE, AMYLASE in the last 168 hours. No results for input(s): AMMONIA in the last 168 hours.  Coags:  Recent Labs Lab 12/04/15 0823  INR 1.08    Recent Labs Lab 12/04/15 0823  APTT 194*    Cardiac Enzymes:  Recent Labs Lab 12/03/15 2150 12/04/15 0200 12/04/15 0823 12/04/15 1347  TROPONINI 0.24* 1.13* 0.67* 0.47*    CBG:  Recent Labs Lab 12/05/15 1428 12/05/15 1519 12/05/15 2331  GLUCAP 70 118* 91    Recent Results (from the past 240 hour(s))  MRSA PCR Screening     Status: None   Collection Time: 12/04/15 10:01 PM  Result Value Ref Range Status   MRSA by PCR NEGATIVE NEGATIVE Final     Comment:        The GeneXpert MRSA Assay (FDA approved for NASAL specimens only), is one component of a comprehensive MRSA colonization surveillance program. It is not intended to diagnose MRSA infection nor to guide or monitor treatment for MRSA infections.   Culture, blood (routine x 2)     Status: None (Preliminary result)   Collection Time: 12/06/15 12:36 AM  Result Value Ref Range Status   Specimen Description BLOOD LEFT ARM  Final   Special Requests BOTTLES DRAWN AEROBIC AND ANAEROBIC 5ML  Final   Culture NO GROWTH 1 DAY  Final   Report Status PENDING  Incomplete  Culture, blood (routine x 2)     Status: None (Preliminary result)   Collection Time: 12/06/15  1:10 AM  Result Value Ref Range Status   Specimen Description BLOOD LEFT ARM  Final   Special Requests BOTTLES DRAWN AEROBIC AND ANAEROBIC 5ML  Final   Culture  NO GROWTH 1 DAY  Final   Report Status PENDING  Incomplete     Studies:   Recent x-ray studies have been reviewed in detail by the Attending Physician  Scheduled Meds:  Scheduled Meds: . amiodarone  400 mg Oral BID  . amLODipine  10 mg Oral Daily  . aspirin EC  81 mg Oral q morning - 10a  . clopidogrel  75 mg Oral QHS  . docusate sodium  200 mg Oral BID  . estradiol  1 mg Oral Daily  . heparin  5,000 Units Subcutaneous 3 times per day  . irbesartan  300 mg Oral Daily  . levothyroxine  88 mcg Oral QAC breakfast  . metoprolol tartrate  25 mg Oral BID  . multivitamin with minerals  1 tablet Oral q morning - 10a  . pantoprazole  40 mg Oral Daily  . rosuvastatin  40 mg Oral q1800  . spironolactone  25 mg Oral Daily  . zolpidem  5 mg Oral QHS    Time spent on care of this patient: 35 mins   Kendy Haston T , MD   [facility] Office  [phone] Pager - Text Page per Shea Evans as per below:  On-Call/Text Page:      [email or web address]      password [email or web address]  If 7PM-7AM, please contact night-coverage [email or web address] Password [email or web address] 12/08/2015, 10:09  AM   LOS: 5 days

## 2015-12-08 NOTE — Consult Note (Signed)
Referring Physician: Dr Thereasa Solo, J    Chief Complaint: stroke on MRI  HPI:                                                                                                                                         [name] is an 75 y.o. female with a past medical history significant for HTN HLD, PAF no taking anticoagulant, ovarian cancer stage IIIB on chemotherapy, initially admitted to [facility] on 1/5 with hypertensive urgency and NSTEMI. She underwent successful cardiac cath via right radial artery and was noted to be very lethargic after procedure. Family is at the bedside and stated that shortly after cath they noticed that she was not able to see well.  Patient is not quite sure if her vision declined afterwards but she tells me that yesterday she had a very peculiar vision change in which she will see her husband " sitting in a chair but also standing and walking from side to side". At the same time, she expressed that she couldn't see almost anything in her left peripheral vision. Stated that these changes lasted approximately 4 hours and then her vision returned to normal. Denies associated HA, vertigo, double vision, focal weakness or numbness, slurred speech, confusion, or language impairment. Her symptoms prompted MRI brain that I reviewed myself and revealed several small areas of acute infarct in the right posterior temporoparietal lobe just above the tentorium. On plavix and ASA 81 mg daily.  Date last known well: uncertain Time last known well: uncertain tPA Given: no, out of the window   Past Medical History  Diagnosis Date  . Hypertension   . Dyslipidemia   . Hypothyroidism   . Arthritis     OSTEOARTHRITIS   -- CONSTANT PAIN RIGHT HIP---AND PAIN LEFT KNEE--PT STATES SHE GETS INJECTIONS INTO HER KNEE  . PAF (paroxysmal atrial fibrillation) (HCC)     Only on Plavix -- not full Anticoagulation per pt. request  . Complication of anesthesia     BLOOD PRESSURE DROPPED WITH  NASAL SURGERY, ONE OF THE CARPAL TUNNEL REPAIRS AND DURING A COLONOSCOPY  . History of skin cancer   . Constipation   . Ovarian cancer ([place]) 01/2013    Recurrence since 2014/2016    Past Surgical History  Procedure Laterality Date  . Bilateral carpal tunnel repair  2007  . Dilation and curettage of uterus  1969  . Surgery for ruptured ovarian cyst  1969  . Rhinoplasty for fractured nose  1986  . Left knee arthroscopy   2011  . Total hip arthroplasty  02/25/2012    Procedure: TOTAL HIP ARTHROPLASTY ANTERIOR APPROACH;  Surgeon: Mcarthur Rossetti, MD;  Location: [facility];  Service: Orthopedics;  Laterality: Right;  . Tonsillectomy  1962  . Laparotomy Bilateral 02/13/2013    Procedure: EXPLORATORY LAPAROTOMY TOTAL ABDOMINAL HYSTERECTOMY BILATERAL SALPINGO-OOPHORECTOMY, Partial Rectal Resection with Reanastamosis;  Surgeon:  Alvino Chapel, MD;  Location: [facility];  Service: Gynecology;  Laterality: Bilateral;  . Omentectomy  02/13/2013    Procedure: OMENTECTOMY;  Surgeon: Alvino Chapel, MD;  Location: [facility];  Service: Gynecology;;  . Lymphadenectomy Right 02/13/2013    Procedure: PEVLIC  LYMPHADENECTOMY, DEBULKING right pelvic tumor nodules;  Surgeon: Alvino Chapel, MD;  Location: [facility];  Service: Gynecology;  Laterality: Right;  . Total abdominal hysterectomy  01/2013  . Transthoracic echocardiogram  May 2014    Normal LV size function. EF 60-65%. Grade 1 diastolic function. Mild MR and mildly elevated PA pressures of 37 mmHg per  . Nm myoview ltd  June 2010     subbmaximal with no ischemia or infarction.  . Total knee arthroplasty Left 01/03/2015    Procedure: LEFT TOTAL KNEE ARTHROPLASTY;  Surgeon: Mcarthur Rossetti, MD;  Location: [facility];  Service: Orthopedics;  Laterality: Left;  . Cardiac catheterization N/A 12/05/2015    Procedure: Left Heart Cath and Coronary Angiography;  Surgeon: Belva Crome, MD;  Location: [place] [facility];  Service: Cardiovascular;   Laterality: N/A;    Family History  Problem Relation Age of Onset  . Hypertension Mother   . Basal cell carcinoma Mother   . AAA (abdominal aortic aneurysm) Father   . Heart Problems Maternal Uncle   . Heart Problems Maternal Grandfather   . AAA (abdominal aortic aneurysm) Paternal Grandmother   . Prostate cancer Maternal Uncle 70  . Prostate cancer Other   . Other Daughter     one daughter had TAH-BSO at 28; other daughter will have one soon   Social History:  reports that she has never smoked. She has never used smokeless tobacco. She reports that she does not drink alcohol or use illicit drugs.  Allergies:  Allergies  Allergen Reactions  . Coreg [Carvedilol] Shortness Of Breath  . Ceftin [Cefuroxime Axetil]     Interferes with propafenone.    . Clonidine Derivatives   . Codeine Other (See Comments)    Does not like the feeling she gets  . Flexeril [Cyclobenzaprine] Other (See Comments)    Pt states "increased heart rate"  . Levaquin [Levofloxacin In D5w]     Interferes with propafenone.   . Lipitor [Atorvastatin] Dermatitis    "feels like bugs are biting her"   . Lisinopril     LIP NUMBNESS  . Tegaderm Ag Mesh [Silver]   . Tape Rash    TEGADERM.   (use opsite on PAC)  . Ultram [Tramadol] Palpitations    Medications:                                                                                                                           Scheduled: . amiodarone  400 mg Oral BID  . amLODipine  10 mg Oral Daily  . aspirin EC  81 mg Oral q morning - 10a  . clopidogrel  75 mg Oral QHS  .  docusate sodium  200 mg Oral BID  . estradiol  1 mg Oral Daily  . heparin  5,000 Units Subcutaneous 3 times per day  . irbesartan  300 mg Oral Daily  . levothyroxine  88 mcg Oral QAC breakfast  . metoprolol tartrate  25 mg Oral BID  . multivitamin with minerals  1 tablet Oral q morning - 10a  . pantoprazole  40 mg Oral Daily  . rosuvastatin  40 mg Oral q1800  . zolpidem  5 mg  Oral QHS    ROS:                                                                                                                                       History obtained from family, chart review and the patient  General ROS: negative for - chills, fatigue, fever, night sweats, weight gain or weight loss Psychological ROS: negative for - behavioral disorder, hallucinations, memory difficulties, mood swings or suicidal ideation Ophthalmic ROS: negative for - blurry vision, double vision, or eye pain  ENT ROS: negative for - epistaxis, nasal discharge, oral lesions, sore throat, tinnitus or vertigo Allergy and Immunology ROS: negative for - hives or itchy/watery eyes Hematological and Lymphatic ROS: negative for - bleeding problems, bruising or swollen lymph nodes Endocrine ROS: negative for - galactorrhea, hair pattern changes, polydipsia/polyuria or temperature intolerance Respiratory ROS: negative for - cough, hemoptysis, shortness of breath or wheezing Cardiovascular ROS: negative for - dyspnea on exertion, edema or irregular heartbeat Gastrointestinal ROS: negative for - abdominal pain, diarrhea, hematemesis, nausea/vomiting or stool incontinence Genito-Urinary ROS: negative for - dysuria, hematuria, incontinence or urinary frequency/urgency Musculoskeletal ROS: negative for - joint swelling or muscular weakness Neurological ROS: as noted in HPI Dermatological ROS: negative for rash and skin lesion changes   Physical exam:  Constitutional: well developed, pleasant female in no apparent distress. Blood pressure 163/86, pulse 83, temperature 97.8 F (36.6 C), temperature source Oral, resp. rate 17, height 5' 3.5" (1.613 m), weight 58 kg (127 lb 13.9 oz), SpO2 100 %. Eyes: no jaundice or exophthalmos.  Head: normocephalic. Neck: supple, no bruits, no JVD. Cardiac: no murmurs. Lungs: clear. Abdomen: soft, no tender, no mass. Extremities: no edema, clubbing, or cyanosis.  Skin: no  rash  Neurologic Examination:                                                                                                      General: NAD Mental Status: Alert, oriented, thought content  appropriate.  Speech fluent without evidence of aphasia.  Able to follow 3 step commands without difficulty. Cranial Nerves: II: Discs flat bilaterally; Visual fields grossly normal, pupils equal, round, reactive to light and accommodation III,IV, VI: ptosis not present, extra-ocular motions intact bilaterally V,VII: smile symmetric, facial light touch sensation normal bilaterally VIII: hearing normal bilaterally IX,X: uvula rises symmetrically XI: bilateral shoulder shrug XII: midline tongue extension without atrophy or fasciculations  Motor: Right : Upper extremity   5/5    Left:     Upper extremity   5/5  Lower extremity   5/5     Lower extremity   5/5 Tone and bulk:normal tone throughout; no atrophy noted Sensory: Pinprick and light touch intact throughout, bilaterally Deep Tendon Reflexes:  Right: Upper Extremity   Left: Upper extremity   biceps (C-5 to C-6) 2/4   biceps (C-5 to C-6) 2/4 tricep (C7) 2/4    triceps (C7) 2/4 Brachioradialis (C6) 2/4  Brachioradialis (C6) 2/4  Lower Extremity Lower Extremity  quadriceps (L-2 to L-4) 2/4   quadriceps (L-2 to L-4) 2/4 Achilles (S1) 2/4   Achilles (S1) 2/4  Plantars: Right: downgoing   Left: downgoing Cerebellar: normal finger-to-nose,  normal heel-to-shin test Gait:  Able to walk with a walker, no ataxia or bradykinesia     Results for orders placed or performed during the hospital encounter of 12/03/15 (from the past 48 hour(s))  CBC     Status: Abnormal   Collection Time: 12/06/15  4:17 AM  Result Value Ref Range   WBC 8.4 4.0 - 10.5 K/uL   RBC 3.70 (L) 3.87 - 5.11 MIL/uL   Hemoglobin 12.4 12.0 - 15.0 g/dL   HCT 37.8 36.0 - 46.0 %   MCV 102.2 (H) 78.0 - 100.0 fL   MCH 33.5 26.0 - 34.0 pg   MCHC 32.8 30.0 - 36.0 g/dL   RDW  14.7 11.5 - 15.5 %   Platelets 144 (L) 150 - 400 K/uL  Basic metabolic panel     Status: Abnormal   Collection Time: 12/06/15  4:17 AM  Result Value Ref Range   Sodium 139 135 - 145 mmol/L   Potassium 3.0 (L) 3.5 - 5.1 mmol/L   Chloride 105 101 - 111 mmol/L   CO2 25 22 - 32 mmol/L   Glucose, Bld 85 65 - 99 mg/dL   BUN 7 6 - 20 mg/dL   Creatinine, Ser 0.56 0.44 - 1.00 mg/dL   Calcium 8.7 (L) 8.9 - 10.3 mg/dL   GFR calc non Af Amer >60 >60 mL/min   GFR calc Af Amer >60 >60 mL/min    Comment: (NOTE) The eGFR has been calculated using the CKD EPI equation. This calculation has not been validated in all clinical situations. eGFR's persistently <60 mL/min signify possible Chronic Kidney Disease.    Anion gap 9 5 - 15  Urinalysis, Routine w reflex microscopic (not at [facility])     Status: Abnormal   Collection Time: 12/06/15  6:49 AM  Result Value Ref Range   Color, Urine AMBER (A) YELLOW    Comment: BIOCHEMICALS MAY BE AFFECTED BY COLOR   APPearance CLEAR CLEAR   Specific Gravity, Urine 1.037 (H) 1.005 - 1.030   pH 6.0 5.0 - 8.0   Glucose, UA NEGATIVE NEGATIVE mg/dL   Hgb urine dipstick SMALL (A) NEGATIVE   Bilirubin Urine SMALL (A) NEGATIVE   Ketones, ur 40 (A) NEGATIVE mg/dL   Protein, ur 30 (A) NEGATIVE mg/dL   Nitrite  NEGATIVE NEGATIVE   Leukocytes, UA NEGATIVE NEGATIVE  Urine microscopic-add on     Status: Abnormal   Collection Time: 12/06/15  6:49 AM  Result Value Ref Range   Squamous Epithelial / LPF 0-5 (A) NONE SEEN   WBC, UA 0-5 0 - 5 WBC/hpf   RBC / HPF NONE SEEN 0 - 5 RBC/hpf   Bacteria, UA RARE (A) NONE SEEN   Urine-Other MUCOUS PRESENT   Influenza panel by PCR (type A & B, H1N1)     Status: None   Collection Time: 12/06/15 12:56 PM  Result Value Ref Range   Influenza A By PCR NEGATIVE NEGATIVE   Influenza B By PCR NEGATIVE NEGATIVE   H1N1 flu by pcr NOT DETECTED NOT DETECTED    Comment:        The Xpert Flu assay (FDA approved for nasal aspirates or washes  and nasopharyngeal swab specimens), is intended as an aid in the diagnosis of influenza and should not be used as a sole basis for treatment.    Dg Chest 2 View  12/06/2015  CLINICAL DATA:  History of ovarian cancer.  Fever for 3 days. EXAM: CHEST  2 VIEW COMPARISON:  12/04/2015 FINDINGS: There is a right chest wall port a catheter with tip in the projection of the SVC. The heart size appears normal. There is no pleural effusion or edema could no airspace consolidation is identified. Spondylosis is present within the thoracic spine. IMPRESSION: 1. No acute cardiopulmonary abnormalities. 2. Thoracic spondylosis noted. Electronically Signed   By: Kerby Moors M.D.   On: 12/06/2015 14:55   Mr Brain Wo Contrast  12/07/2015  CLINICAL DATA:  Visual disturbance. Double vision. Recent cardiac catheterization 12/05/2015. EXAM: MRI HEAD WITHOUT CONTRAST TECHNIQUE: Multiplanar, multiecho pulse sequences of the brain and surrounding structures were obtained without intravenous contrast. COMPARISON:  CT head 11/26/2015 FINDINGS: Small area of restricted diffusion in the right posterior temporal/ parietal lobe. Several small areas of restricted diffusion are present just above the tentorium. The largest measures 1 cm. Others measure 3-5 mm. There appears to be normal signal in the right transverse sinus without evidence of venous sinus thrombosis. No other areas of acute infarct. Generalized atrophy. Moderate chronic microvascular ischemic change in the white matter. Brainstem and basal ganglia intact. No cerebellar infarct. Chronic micro hemorrhage left temporoparietal lobe. Micro hemorrhage left cerebellum. No hematoma. Negative for mass or edema.  No shift of the midline structures Paranasal sinuses clear. IMPRESSION: Small areas of acute infarct in the right temporoparietal lobe just above the tentorium. No evidence of venous sinus thrombosis. These may be embolic given the history of recent cardiac  catheterization. Atrophy and chronic microvascular ischemia. Chronic micro hemorrhage left parietal lobe and left cerebellum likely due to chronic hypertension. Electronically Signed   By: Franchot Gallo M.D.   On: 12/07/2015 19:38     Assessment: 75 y.o. female with several risk factors for stroke, NSTEMI s/p cardiac cath via right radial artery 1/5 with subsequent development of transient visual disturbances with the characteristics described above, and MRI revealing several small areas of acute infarction involving the right posterior temporoparietal region. Cardioembolism in the context of PAF or cardiac procedure related stroke are likely explanations. At this time, no obvious lateralizing neurological signs. Patient could benefit from NOAC for secondary stroke prevention. Complete stroke work up. Stroke team will resume care tomorrow.   Stroke Risk Factors -age, HTN HLD, PAF   Plan: 1. HgbA1c, fasting lipid panel 2. MRI, MRA  of the brain without contrast 3. Echocardiogram 4. Carotid dopplers 5. Prophylactic therapy-NOAC if stroke team concurs, continue plavix+ASA for now 6. Risk factor modification 7. Telemetry monitoring 8. Frequent neuro checks 9. PT/OT SLP 10. NPO  Dorian Pod, MD [facility] [phone]  12/08/2015, 2:01 AM

## 2015-12-09 DIAGNOSIS — I214 Non-ST elevation (NSTEMI) myocardial infarction: Secondary | ICD-10-CM

## 2015-12-09 DIAGNOSIS — I6330 Cerebral infarction due to thrombosis of unspecified cerebral artery: Secondary | ICD-10-CM

## 2015-12-09 DIAGNOSIS — E785 Hyperlipidemia, unspecified: Secondary | ICD-10-CM | POA: Insufficient documentation

## 2015-12-09 DIAGNOSIS — R079 Chest pain, unspecified: Secondary | ICD-10-CM | POA: Insufficient documentation

## 2015-12-09 DIAGNOSIS — I639 Cerebral infarction, unspecified: Secondary | ICD-10-CM | POA: Insufficient documentation

## 2015-12-09 DIAGNOSIS — E039 Hypothyroidism, unspecified: Secondary | ICD-10-CM

## 2015-12-09 DIAGNOSIS — D61810 Antineoplastic chemotherapy induced pancytopenia: Secondary | ICD-10-CM

## 2015-12-09 DIAGNOSIS — H539 Unspecified visual disturbance: Secondary | ICD-10-CM | POA: Insufficient documentation

## 2015-12-09 DIAGNOSIS — I480 Paroxysmal atrial fibrillation: Secondary | ICD-10-CM | POA: Insufficient documentation

## 2015-12-09 HISTORY — DX: Non-ST elevation (NSTEMI) myocardial infarction: I21.4

## 2015-12-09 LAB — COMPREHENSIVE METABOLIC PANEL
ALK PHOS: 49 U/L (ref 38–126)
ALT: 12 U/L — AB (ref 14–54)
ANION GAP: 9 (ref 5–15)
AST: 19 U/L (ref 15–41)
Albumin: 3.2 g/dL — ABNORMAL LOW (ref 3.5–5.0)
BUN: 9 mg/dL (ref 6–20)
CALCIUM: 8.6 mg/dL — AB (ref 8.9–10.3)
CO2: 28 mmol/L (ref 22–32)
CREATININE: 0.53 mg/dL (ref 0.44–1.00)
Chloride: 103 mmol/L (ref 101–111)
Glucose, Bld: 93 mg/dL (ref 65–99)
Potassium: 3.7 mmol/L (ref 3.5–5.1)
Sodium: 140 mmol/L (ref 135–145)
TOTAL PROTEIN: 5.5 g/dL — AB (ref 6.5–8.1)
Total Bilirubin: 0.7 mg/dL (ref 0.3–1.2)

## 2015-12-09 LAB — TSH: TSH: 5.427 u[IU]/mL — ABNORMAL HIGH (ref 0.350–4.500)

## 2015-12-09 LAB — HEMOGLOBIN A1C
HEMOGLOBIN A1C: 4.9 % (ref 4.8–5.6)
Mean Plasma Glucose: 94 mg/dL

## 2015-12-09 MED ORDER — AMLODIPINE BESYLATE 5 MG PO TABS: 5.0000 mg | ORAL_TABLET | Freq: Every day | ORAL | Status: DC

## 2015-12-09 MED ORDER — HEPARIN SOD (PORK) LOCK FLUSH 100 UNIT/ML IV SOLN
500.0000 [IU] | INTRAVENOUS | Status: AC | PRN
  Administered 2015-12-09: 500 [IU]

## 2015-12-09 MED ORDER — AMIODARONE HCL 400 MG PO TABS: 400.0000 mg | ORAL_TABLET | Freq: Two times a day (BID) | ORAL | Status: DC

## 2015-12-09 MED ORDER — METOPROLOL TARTRATE 25 MG PO TABS: 25.0000 mg | ORAL_TABLET | Freq: Two times a day (BID) | ORAL | Status: DC

## 2015-12-09 MED ORDER — IRBESARTAN 300 MG PO TABS: 300.0000 mg | ORAL_TABLET | Freq: Every day | ORAL | Status: DC

## 2015-12-09 NOTE — Progress Notes (Signed)
TELEMETRY: Reviewed telemetry pt in NSR , no Afib seen: Filed Vitals:   12/09/15 0040 12/09/15 0342 12/09/15 0345 12/09/15 0740  BP: 147/80 151/93 151/93 151/85  Pulse:    70  Temp:  97.2 F (36.2 C)  97.6 F (36.4 C)  TempSrc:  Oral  Oral  Resp:  22  21  Height:      Weight:  57.244 kg (126 lb 3.2 oz)    SpO2:  99%  100%    Intake/Output Summary (Last 24 hours) at 12/09/15 0842 Last data filed at 12/09/15 0000  Gross per 24 hour  Intake    790 ml  Output      0 ml  Net    790 ml   Filed Weights   12/07/15 0500 12/08/15 0440 12/09/15 0342  Weight: 58 kg (127 lb 13.9 oz) 58.06 kg (128 lb) 57.244 kg (126 lb 3.2 oz)    Subjective Feels better today. No HA or visual changes. Denies chest pain or SOB.   Marland Kitchen amiodarone  400 mg Oral BID  . amLODipine  5 mg Oral Daily  . aspirin EC  81 mg Oral q morning - 10a  . clopidogrel  75 mg Oral QHS  . docusate sodium  200 mg Oral BID  . estradiol  1 mg Oral Daily  . heparin  5,000 Units Subcutaneous 3 times per day  . irbesartan  300 mg Oral Daily  . levothyroxine  88 mcg Oral QAC breakfast  . metoprolol tartrate  25 mg Oral BID  . multivitamin with minerals  1 tablet Oral q morning - 10a  . pantoprazole  40 mg Oral Daily  . rosuvastatin  40 mg Oral q1800  . spironolactone  25 mg Oral Daily  . zolpidem  5 mg Oral QHS   . sodium chloride 10 mL/hr at 12/07/15 1445    LABS: Basic Metabolic Panel:  Recent Labs  12/08/15 0345 12/09/15 0511  NA 139 140  K 4.0 3.7  CL 105 103  CO2 27 28  GLUCOSE 107* 93  BUN 6 9  CREATININE 0.57 0.53  CALCIUM 9.3 8.6*  MG 1.9  --    Liver Function Tests:  Recent Labs  12/09/15 0511  AST 19  ALT 12*  ALKPHOS 49  BILITOT 0.7  PROT 5.5*  ALBUMIN 3.2*   No results for input(s): LIPASE, AMYLASE in the last 72 hours. CBC:  Recent Labs  12/08/15 0345  WBC 7.0  HGB 13.5  HCT 40.7  MCV 102.5*  PLT 147*   Cardiac Enzymes: No results for input(s): CKTOTAL, CKMB, CKMBINDEX,  TROPONINI in the last 72 hours. BNP: No results for input(s): PROBNP in the last 72 hours. D-Dimer: No results for input(s): DDIMER in the last 72 hours. Hemoglobin A1C:  Recent Labs  12/08/15 0401  HGBA1C 4.9   Fasting Lipid Panel: No results for input(s): CHOL, HDL, LDLCALC, TRIG, CHOLHDL, LDLDIRECT in the last 72 hours. Thyroid Function Tests:  Recent Labs  12/09/15 0510  TSH 5.427*     Radiology/Studies:  Mr Brain Wo Contrast  12/07/2015  CLINICAL DATA:  Visual disturbance. Double vision. Recent cardiac catheterization 12/05/2015. EXAM: MRI HEAD WITHOUT CONTRAST TECHNIQUE: Multiplanar, multiecho pulse sequences of the brain and surrounding structures were obtained without intravenous contrast. COMPARISON:  CT head 11/26/2015 FINDINGS: Small area of restricted diffusion in the right posterior temporal/ parietal lobe. Several small areas of restricted diffusion are present just above the tentorium. The largest measures 1 cm.  Others measure 3-5 mm. There appears to be normal signal in the right transverse sinus without evidence of venous sinus thrombosis. No other areas of acute infarct. Generalized atrophy. Moderate chronic microvascular ischemic change in the white matter. Brainstem and basal ganglia intact. No cerebellar infarct. Chronic micro hemorrhage left temporoparietal lobe. Micro hemorrhage left cerebellum. No hematoma. Negative for mass or edema.  No shift of the midline structures Paranasal sinuses clear. IMPRESSION: Small areas of acute infarct in the right temporoparietal lobe just above the tentorium. No evidence of venous sinus thrombosis. These may be embolic given the history of recent cardiac catheterization. Atrophy and chronic microvascular ischemia. Chronic micro hemorrhage left parietal lobe and left cerebellum likely due to chronic hypertension. Electronically Signed   By: Franchot Gallo M.D.   On: 12/07/2015 19:38    PHYSICAL EXAM General: Well developed, WF, in  no acute distress. Head: Normocephalic, atraumatic, sclera non-icteric, oropharynx is clear Neck: Negative for carotid bruits. JVD not elevated. No adenopathy Lungs: Clear bilaterally to auscultation without wheezes, rales, or rhonchi. Breathing is unlabored. Heart: RRR S1 S2 without murmurs, rubs, or gallops.  Abdomen: Soft, non-tender, non-distended with normoactive bowel sounds.  Msk:  Strength and tone appears normal for age. Extremities: No clubbing, cyanosis or edema.  Distal pedal pulses are 2+ and equal bilaterally. Radial site without hematoma. Neuro: Alert and oriented X 3. Moves all extremities spontaneously. Psych:  Responds to questions appropriately with a normal affect.  ASSESSMENT AND PLAN: 1. Acute right posterior temporoparietal CVA. Symptoms resolved. Temporally related to cardiac cath procedure suggesting this may have been related to dislodgement of plaque during procedure. Other possibilities include hypertensive CVA or embolic related to afib. 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 This was present in May 2014. No Afib since 12/07/15.  Discussed with Dr. Leonie Man. Given the fact that her acute CVA was procedure related it is reasonable to continue antiplatelet therapy for the short term. In the long term anticoagulation is recommended due to high [place] vasc score and Afib. Ovarian CA may also increase risk of DVT.  For now will continue antiplatelet therapy and let her discuss further with Dr. Ellyn Hack and her oncologist.  2. NSTEMI. Secondary to demand ischemia with small vessel CAD and severe HTN. No recurrent angina. Ecg shows nonspecific T wave flattening. Peak troponin 1.13. LV functio was normal at cath. EF normal by Echo. On metoprolol and amlodipine. On DAPT for now.  3. Paroxysmal afib. Was on propafenone in past which is contraindicated with CAD. Now on amiodarone 400 mg bid. Would continue this dose for one week then reduce  to daily. Eventually try and reduce dose to minimize side effects. Will need follow up TSH as outpatient.  4. Hypertensive urgency. BP improved. Now on irbesartan 300 mg daily, metoprolol 25 mg bid, and amlodipine 5 mg daily (dose reduced to avoid lowering BP too quickly with CVA). On aldactone 25 mg daily.  5. Fever resolved.  6. Ovarian CA stage IIIB - seeing radiation oncologist in am.  7. LV mass. Mobile attached to septum in LVOT. Most consistent with papillary fibroelastoma. Present on prior Echo in May 2014. Discussed possible TEE to characterize further but she is not a candidate for surgical resection now with ovarian CA so will defer to Dr. Ellyn Hack.   Plan: stable for DC today from our standpoint. She has an appointment to see Dr. Ellyn Hack on 12/16/15.   Present on Admission:  Marland Kitchen (  Resolved) Elevated troponin . ACS (acute coronary syndrome) ([place]) . Hypertension due to drug . (Resolved) Hypertensive urgency . NSTEMI (non-ST elevated myocardial infarction) ([place]) . Hypothyroidism . PAF (paroxysmal atrial fibrillation) ([place]) . Fever . Constipation . 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 Antineoplastic chemotherapy induced pancytopenia ([place]) . Dehydration  Signed, Peter Martinique, [place] 12/09/2015 8:42 AM

## 2015-12-09 NOTE — Progress Notes (Addendum)
GI Location of Tumor / Histology: Recurrent Ovarian  Cancer stage III  now mass  Mets to  Spleen/Pelvis  [name] presented months ago with symptoms of: Hypertensive urgency and bradycardia with abnormal EKG(ST depression) NSTEMI 12/04/15 admitted to hospital   Biopsies of  (if applicable) revealed:   Past/Anticipated interventions by surgeon, if any: Left Heart  Cardiac catheterization  And Coronary angioplasty 12/05/15  Dr. Mallie Mussel Smith,MD  Past/Anticipated interventions by medical oncology, if any: Chemotherapy  Partial response  To Taxol,carboplatin,avastin, used x 8 cycles from 05/01/15-10/21/15 ; stopped Dr. Marko Plume   Weight changes, if any: stable   Bowel/Bladder complaints, if any: constipation, takes miralax prn, last bowels moved today, no bladder problems   Nausea / Vomiting, if any: NO  Pain issues, if any: slight heaviness left chest stated 30 minutes, no sob, no pain stated,   Any blood per rectum:   NO  SAFETY ISSUES: YES, Recent stroke ,  balance left extremity weakness, left peripheral vision, lower left quadrant poor from stroke  Prior radiation? NO  Pacemaker/ICD?  Possible current pregnancy? N/A  Is the patient on methotrexate? NO  Current details/c/o: Recent  Hospital 12/03/14-12/09/15 Sees Dr. Clint Lipps Married, 3 children, 5 grandchildren,  BP 160/73 mmHg  Pulse 65  Temp(Src) 98 F (36.7 C) (Oral)  Resp 20  Ht 5' 3.5" (1.613 m)  Wt 132 lb 3.2 oz (59.966 kg)  BMI 23.05 kg/m2  SpO2 100%  Wt Readings from Last 3 Encounters:  12/10/15 132 lb 3.2 oz (59.966 kg)  12/09/15 126 lb 3.2 oz (57.244 kg)  11/20/15 135 lb (61.236 kg)

## 2015-12-09 NOTE — Discharge Summary (Signed)
Physician Discharge Summary  [name] [id number] DOB: 11-01-1941 DOA: 12/03/2015  PCP: Precious Reel, MD  Admit date: 12/03/2015 Discharge date: 12/09/2015  Time spent: 40 minutes  Recommendations for Outpatient Follow-up:  FUO -Tmax recorded is 101.3 on 1/7 - still no elevated temp since that time - no clinical evidence of PNA and CXR unremarkable - flu panel negative - UA not c/w UTI - follow - ?unusual reaction to sedation - appears to have resolved - resp virus panel still pending   Several small areas of acute infarction involving the right posterior temporoparietal region -Felt to be due to cardioembolism in the context of PAF or cardiac procedure related stroke  - Stroke Team spoke with Cardiology on 1/10 and voiced her concern that patient should be on NOAC prior to discharge, however given patient's reluctance agreed to allow Dr. 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 vehicles until cleared by neurology  -Schedule follow-up in 2 months with [facility] stroke team  Visual disturbance -Resolved  Hypertension  -Avastin was initially felt to be the cause and was discontinued - in setting of acute/subacute CVA, need to avoid overaggressive correction in the short term  -Amlodipine 5 mg daily -Amiodarone 400 mg BID -Irbesartan 300 mg daily -Metoprolol 25 mg BID -Spironolactone 25 mg  NSTEMI  -Management per Cardiology - s/p cath noting multifocal disease w/ plan for med tx w/ ASA, BB, palvix, statin  PAF  -care as directed by Cardiology service -Currently NSR and rate controlled  -Dr. Glenetta Hew Cardiologist to determine if and when to restart anticoagulation. Per cardiology note patient has appointment on 1/17  HLD -Per neurology note not on statin at home. Intolerant to Lipitor (dermatitis), unfortunately was not started on medication prior to discharge to determine if patient able to  tolerate. -Defer to PCP/Neurologist/Cardiologist in starting statin as outpatient. 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 -Last chemotherapy November 22 and was stopped secondary to drug-induced hypertension (Avastin)  -CT head w/ contrast 12/28; no evidence of intracranial mets; doubt any of her current sx are related to this diagnosis - no masses noted on MRI brain as well  -Per patient appointment with radiation oncologist on 1/11  Macrocytosis  -B12 and folate normal/high   Chemotherapy induced pancytopenia  -appears to have resolved at this time   Dehydration -resolved   Hypothyroidism - 1/10 TSH elevated, free T4 ordered  -PCP determine if Synthroid should be increased, discharge on Synthroid 88 g daily  -Schedule Follow-up with Dr. 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 Patient was in her usual state of health, preparing a meal in the kitchen, when she developed a central chest pressure with radiation to the left scapula, described as moderate in intensity, constant and  resolving by the time of her arrival. She denied any associated diaphoresis, nausea, vomiting, or palpitations. There was some associated dyspnea. Patient states that she took her blood pressure during the episode and found to be 210/100. She is followed by cardiologist Dr. Ellyn Hack in the outpatient setting and describes similar symptoms previously when she was first diagnosed with atrial fibrillation.  In ED, patient was found to be hypertensive to 210/110, with borderline bradycardia. 12-lead EKG reveals ST depression across contiguous precordial leads and chest x-ray was negative for acute cardiopulmonary disease. Initial blood work returned notable for a troponin elevated to a value of 0.26 and the on-call cardiologist was consulted from the ED. Dr. Philbert Riser of cardiology kindly took the consult and recommended admission at [facility] for serial measurement of cardiac biomarkers and control of hypertension, with differential diagnosis favoring demand ischemia from hypertensive urgency/emergency. Treatment during his hospitalization was per Cardiology/Neurology. See their notes for further information. Reviewing patient's chart appears that mass in patients LVOT which has enlarged since 2014 was discussed with patient by cardiology and due to her ongoing ovarian cancer patient not surgical patient. Therefore patient did not receive recommended TEE. In addition from review of chart appears that cardiology and neurology (stroke team) discussed with patient that her new CVA secondary to left heart cath.    Procedures: 1/6 left heart cath and coronary angiography; -1st Diag lesion, 95% stenosed. Colon [place] 2nd Diag lesion, 75% stenosed. -Dist LAD lesion, 50% stenosed.- -Prox RCA to Mid RCA lesion, 40% stenosed. 1/8 MRI brain WO contrast;-Small areas of acute infarct Rt temporoparietal lobe just above the tentorium. Negative venous sinus thrombosis. May be embolic given Hx recent cardiac  catheterization. -Atrophy and chronic microvascular ischemia. -Chronic micro hemorrhage left parietal lobe and left cerebellum likely due to chronic hypertension. 1/9 echocardiogram;- Left ventricle: Mobile echodensity 10 x 6 mm attached to interventricular septum, below LVOT This was present in echo from 2014. Slightly more prominent now Not clearly associated with chordae. Fibroelastoma.? -LVEF=60% to 65%.      Consultations: Dr.Peter M Martinique cardiology [place] stroke team      Discharge Exam: Filed Vitals:   12/09/15 0040 12/09/15 0342 12/09/15 0345 12/09/15 0740  BP: 147/80 151/93 151/93 151/85  Pulse:    70  Temp:  97.2 F (36.2 C)  97.6 F (36.4 C)  TempSrc:  Oral  Oral  Resp:  22  21  Height:      Weight:  57.244 kg (126 lb 3.2 oz)    SpO2:  99%  100%    General: A/O 4, NAD Cardiovascular: Regular rhythm and rate, negative murmurs rubs or gallops, normal S1/S2 Respiratory: Clear to auscultation bilateral  Discharge Instructions     Medication List    ASK your doctor about these medications        amoxicillin 500 MG capsule  Commonly known as:  AMOXIL  Reported on 11/20/2015     aspirin EC 81 MG tablet  Take 81 mg by mouth every morning.     bisacodyl 5 MG EC tablet  Commonly known as:  DULCOLAX  Take 5 mg by mouth daily as needed for moderate constipation. Reported on 11/20/2015     clopidogrel 75  MG tablet  Commonly known as:  PLAVIX  TAKE 1 TABLET BY MOUTH AT BEDTIME     desonide 0.05 % cream  Commonly known as:  DESOWEN  Apply 1 application topically 2 (two) times daily.     dexamethasone 4 MG tablet  Commonly known as:  DECADRON  Take 5 tabs with food 12 hrs and 6 hrs prior to taxol. Then take 1 tab with food every morning for the next 4 days.     diphenhydrAMINE 25 MG tablet  Commonly known as:  SOMINEX  Take 25-50 mg by mouth at bedtime as needed for sleep.     docusate sodium 100 MG capsule  Commonly known as:   COLACE  Take 200 mg by mouth 2 (two) times daily.     estradiol 1 MG tablet  Commonly known as:  ESTRACE  Take 1 tablet (1 mg total) by mouth every morning. PT STATES SHE WANTS ESTRACE--NOT THE GENERIC     GLUCOSAMINE PO  Take 1,000 mg by mouth every morning.     ICY HOT EX  Apply 1 application topically daily as needed (pain.).     SYNTHROID 88 MCG tablet  Generic drug:  levothyroxine  Take 88 mcg by mouth daily before breakfast.     levothyroxine 88 MCG tablet  Commonly known as:  SYNTHROID, LEVOTHROID  Take 88 mcg by mouth every morning. Takes on an empty stomach. PT STATES SHE NEEDS THE SYNTHROID--DOES NOT WANT THE GENERIC     lidocaine-prilocaine cream  Commonly known as:  EMLA  Apply to Porta-Cath site 1-2 hours prior to access as directed.     loratadine 10 MG tablet  Commonly known as:  CLARITIN  Take 10 mg by mouth daily as needed for allergies. Reported on 11/20/2015     LORazepam 1 MG tablet  Commonly known as:  ATIVAN  Place 1/2 -1 tablet under the tongue or swallow every 6 hrs as needed for nausea. Will make drowsy.     magnesium hydroxide 800 MG/5ML suspension  Commonly known as:  MILK OF MAGNESIA  Take 30 mLs by mouth daily as needed for constipation. Reported on 11/20/2015     Melatonin 5 MG Caps  Take 1 capsule by mouth at bedtime as needed (sleep).     metoprolol 50 MG tablet  Commonly known as:  LOPRESSOR  Take 1 tablet (50 mg total) by mouth 2 (two) times daily.     multivitamin with minerals Tabs tablet  Take 1 tablet by mouth every morning.     ondansetron 8 MG tablet  Commonly known as:  ZOFRAN  Take 1 tablet (8 mg total) by mouth every 8 (eight) hours as needed for nausea or vomiting (Will not make drowsy).     pantoprazole 40 MG tablet  Commonly known as:  PROTONIX  Take 1 tablet (40 mg total) by mouth daily.     polyethylene glycol packet  Commonly known as:  MIRALAX / GLYCOLAX  Take 17 g by mouth every morning.     potassium  chloride 10 MEQ tablet  Commonly known as:  K-DUR  Take 10 mEq by mouth every Monday, Wednesday, and Friday.     propafenone 225 MG tablet  Commonly known as:  RYTHMOL  TAKE 1 TABLET (225 MG TOTAL) BY MOUTH 2 (TWO) TIMES DAILY.     spironolactone 25 MG tablet  Commonly known as:  ALDACTONE  Take 25 mg by mouth daily.     SYSTANE OP  Place 1 drop into both eyes 2 (two) times daily.     valsartan 320 MG tablet  Commonly known as:  DIOVAN  TAKE 1 TABLET (320 MG TOTAL) BY MOUTH DAILY.       Allergies  Allergen Reactions  . Coreg [Carvedilol] Shortness Of Breath  . Ceftin [Cefuroxime Axetil]     Interferes with propafenone.    . Clonidine Derivatives   . Codeine Other (See Comments)    Does not like the feeling she gets  . Flexeril [Cyclobenzaprine] Other (See Comments)    Pt states "increased heart rate"  . Levaquin [Levofloxacin In D5w]     Interferes with propafenone.   . Lipitor [Atorvastatin] Dermatitis    "feels like bugs are biting her"   . Lisinopril     LIP NUMBNESS  . Tegaderm Ag Mesh [Silver]   . Tape Rash    TEGADERM.   (use opsite on PAC)  . Ultram [Tramadol] Palpitations      The results of significant diagnostics from this hospitalization (including imaging, microbiology, ancillary and laboratory) are listed below for reference.    Significant Diagnostic Studies: Dg Chest 2 View  12/06/2015  CLINICAL DATA:  History of ovarian cancer.  Fever for 3 days. EXAM: CHEST  2 VIEW COMPARISON:  12/04/2015 FINDINGS: There is a right chest wall port a catheter with tip in the projection of the SVC. The heart size appears normal. There is no pleural effusion or edema could no airspace consolidation is identified. Spondylosis is present within the thoracic spine. IMPRESSION: 1. No acute cardiopulmonary abnormalities. 2. Thoracic spondylosis noted. Electronically Signed   By: Kerby Moors M.D.   On: 12/06/2015 14:55   Dg Chest 2 View  12/04/2015  CLINICAL DATA:  Sudden  onset of mid chest pain this evening. EXAM: CHEST  2 VIEW COMPARISON:  Chest radiograph 11/03/2015, chest CT 11/06/2015 FINDINGS: Tip of the right chest port in the mid SVC. The cardiomediastinal contours are normal. Pulmonary vasculature is normal. No consolidation, pleural effusion, or pneumothorax. No acute osseous abnormalities are seen. Degenerative change seen in the thoracic spine. IMPRESSION: No acute pulmonary process. Electronically Signed   By: Jeb Levering M.D.   On: 12/04/2015 00:41   Ct Head W Wo Contrast  11/26/2015  CLINICAL DATA:  Ovarian cancer, continued surveillance. EXAM: CT HEAD WITHOUT AND WITH CONTRAST TECHNIQUE: Contiguous axial images were obtained from the base of the skull through the vertex without and with intravenous contrast CONTRAST:  70mL OMNIPAQUE IOHEXOL 300 MG/ML  SOLN COMPARISON:  MR brain 02/11/2011.  Most recent PET scan 11/18/2015. FINDINGS: No evidence for acute infarction, hemorrhage, mass lesion, hydrocephalus, or extra-axial fluid. Mild cerebral and cerebellar atrophy. Hypoattenuation of white matter, likely chronic microvascular ischemic change. No features suggestive of vasogenic edema. Post infusion, no abnormal enhancement of the brain or visible meninges. Major dural venous sinuses appear patent. No calvarial lesions are evident. Unremarkable skull base, sinuses, and mastoids. BILATERAL flared IACs are symmetric and unchanged from 2012. IMPRESSION: Mild atrophy and suspected small vessel disease. No acute intracranial findings. No features suggestive of intracranial metastatic disease. No acute cause is seen for dizziness. Electronically Signed   By: Staci Righter M.D.   On: 11/26/2015 08:33   Mr Brain Wo Contrast  12/07/2015  CLINICAL DATA:  Visual disturbance. Double vision. Recent cardiac catheterization 12/05/2015. EXAM: MRI HEAD WITHOUT CONTRAST TECHNIQUE: Multiplanar, multiecho pulse sequences of the brain and surrounding structures were obtained  without intravenous contrast. COMPARISON:  CT  head 11/26/2015 FINDINGS: Small area of restricted diffusion in the right posterior temporal/ parietal lobe. Several small areas of restricted diffusion are present just above the tentorium. The largest measures 1 cm. Others measure 3-5 mm. There appears to be normal signal in the right transverse sinus without evidence of venous sinus thrombosis. No other areas of acute infarct. Generalized atrophy. Moderate chronic microvascular ischemic change in the white matter. Brainstem and basal ganglia intact. No cerebellar infarct. Chronic micro hemorrhage left temporoparietal lobe. Micro hemorrhage left cerebellum. No hematoma. Negative for mass or edema.  No shift of the midline structures Paranasal sinuses clear. IMPRESSION: Small areas of acute infarct in the right temporoparietal lobe just above the tentorium. No evidence of venous sinus thrombosis. These may be embolic given the history of recent cardiac catheterization. Atrophy and chronic microvascular ischemia. Chronic micro hemorrhage left parietal lobe and left cerebellum likely due to chronic hypertension. Electronically Signed   By: Franchot Gallo M.D.   On: 12/07/2015 19:38   Nm Pet Image Restag (ps) Skull Base To Thigh  11/18/2015  CLINICAL DATA:  Subsequent treatment strategy for poorly differentiated ovarian cancer. EXAM: NUCLEAR MEDICINE PET SKULL BASE TO THIGH TECHNIQUE: 6.4 mCi F-18 FDG was injected intravenously. Full-ring PET imaging was performed from the skull base to thigh after the radiotracer. CT data was obtained and used for attenuation correction and anatomic localization. FASTING BLOOD GLUCOSE:  Value: 98 mg/dl COMPARISON:  04/03/2015 PET-CT. FINDINGS: NECK No hypermetabolic lymph nodes in the neck. No appreciable change in diffuse thyroid hypermetabolism asymmetrically involving the right thyroid lobe (max SUV 7.5, previously 7.1), without discrete thyroid nodule, most suggestive of diffuse  thyroiditis. CHEST No hypermetabolic axillary, mediastinal or hilar nodes. The borderline enlarged right hilar node described on the 11/06/2015 CT study is non hypermetabolic. Right internal jugular MediPort terminates at the cavoatrial junction. Left anterior descending and right coronary atherosclerosis. Calcified subcentimeter granuloma in the right middle lobe. No acute consolidative airspace disease or significant pulmonary nodules. ABDOMEN/PELVIS There is a 2.8 x 2.5 cm hypermetabolic tumor implant near the splenic hilum between the stomach and spleen (series 4/image 94) with max SUV 10.6, decreased in size and metabolism from 3.9 x 3.7 cm with max SUV of 11.9 on 04/03/2015 PET-CT and slightly decreased in size from 3.0 x 2.7 cm on 11/06/2015. There is a 1.0 cm hypermetabolic node in the right deep pelvic side wall (series 4/image 154) with max SUV 6.3, decreased in size and metabolism from 1.6 cm with max SUV 9.7 on 04/03/2015 PET-CT, and which was obscured by streak artifact on the 11/06/2015 CT study. There are no additional residual hypermetabolic tumor implants in the peritoneal cavity. There are no additional hypermetabolic lymph nodes in the abdomen or pelvis. No abnormal hypermetabolic activity within the liver, pancreas, adrenal glands, or spleen. Small hiatal hernia. Atherosclerotic nonaneurysmal abdominal aorta. SKELETON No focal hypermetabolic activity to suggest skeletal metastasis. IMPRESSION: 1. Residual hypermetabolic tumor implant between the stomach and spleen, significantly decreased in size and mildly decreased in metabolism compared to 04/03/2015 PET-CT, and slightly decreased in size since 11/06/2015. 2. Residual hypermetabolic right deep pelvic side wall lymph node, with partial treatment response compared to 04/03/2015 PET-CT. 3. No new sites of hypermetabolic metastatic disease. 4. Stable diffuse thyroid hypermetabolism without discrete thyroid nodule, most suggestive of diffuse  thyroiditis. Recommend correlation with serum thyroid function tests. Electronically Signed   By: Ilona Sorrel M.D.   On: 11/18/2015 10:48    Microbiology: Recent Results (from the past  240 hour(s))  MRSA PCR Screening     Status: None   Collection Time: 12/04/15 10:01 PM  Result Value Ref Range Status   MRSA by PCR NEGATIVE NEGATIVE Final    Comment:        The GeneXpert MRSA Assay (FDA approved for NASAL specimens only), is one component of a comprehensive MRSA colonization surveillance program. It is not intended to diagnose MRSA infection nor to guide or monitor treatment for MRSA infections.   Culture, blood (routine x 2)     Status: None (Preliminary result)   Collection Time: 12/06/15 12:36 AM  Result Value Ref Range Status   Specimen Description BLOOD LEFT ARM  Final   Special Requests BOTTLES DRAWN AEROBIC AND ANAEROBIC 5ML  Final   Culture NO GROWTH 2 DAYS  Final   Report Status PENDING  Incomplete  Culture, blood (routine x 2)     Status: None (Preliminary result)   Collection Time: 12/06/15  1:10 AM  Result Value Ref Range Status   Specimen Description BLOOD LEFT ARM  Final   Special Requests BOTTLES DRAWN AEROBIC AND ANAEROBIC 5ML  Final   Culture NO GROWTH 2 DAYS  Final   Report Status PENDING  Incomplete     Labs: Basic Metabolic Panel:  Recent Labs Lab 12/03/15 2150 12/04/15 0823 12/05/15 1630 12/06/15 0417 12/08/15 0345 12/09/15 0511  NA 136 139  --  139 139 140  K 3.7 3.6  --  3.0* 4.0 3.7  CL 101 101  --  105 105 103  CO2 28 29  --  25 27 28   GLUCOSE 105* 104*  --  85 107* 93  BUN 10 10  --  7 6 9   CREATININE 0.49 0.55 0.53 0.56 0.57 0.53  CALCIUM 9.3 9.5  --  8.7* 9.3 8.6*  MG  --   --   --   --  1.9  --    Liver Function Tests:  Recent Labs Lab 12/09/15 0511  AST 19  ALT 12*  ALKPHOS 49  BILITOT 0.7  PROT 5.5*  ALBUMIN 3.2*   No results for input(s): LIPASE, AMYLASE in the last 168 hours. No results for input(s): AMMONIA in  the last 168 hours. CBC:  Recent Labs Lab 12/03/15 2150 12/04/15 0823 12/05/15 1630 12/06/15 0417 12/08/15 0345  WBC 5.3 5.1 5.9 8.4 7.0  NEUTROABS 3.3  --   --   --   --   HGB 12.6 13.6 12.5 12.4 13.5  HCT 37.6 40.3 37.3 37.8 40.7  MCV 101.6* 103.3* 101.4* 102.2* 102.5*  PLT 132* 133* 120* 144* 147*   Cardiac Enzymes:  Recent Labs Lab 12/03/15 2150 12/04/15 0200 12/04/15 0823 12/04/15 1347  TROPONINI 0.24* 1.13* 0.67* 0.47*   BNP: BNP (last 3 results)  Recent Labs  12/03/15 2147  BNP 211.7*    ProBNP (last 3 results) No results for input(s): PROBNP in the last 8760 hours.  CBG:  Recent Labs Lab 12/05/15 1428 12/05/15 1519 12/05/15 2331  GLUCAP 70 118* 91       Signed:  Dia Crawford, MD [facility] [phone] pager

## 2015-12-09 NOTE — Progress Notes (Signed)
STROKE TEAM PROGRESS NOTE   SUBJECTIVE (INTERVAL HISTORY) Dr. Leonie Man discussed with Dr. Martinique and Dr. Sherral Hammers related to stroke etiologies and tx options. They together have decided on anticoagulation.   OBJECTIVE Temp:  [97.2 F (36.2 C)-98 F (36.7 C)] 97.6 F (36.4 C) (01/10 0740) Pulse Rate:  [67-71] 70 (01/10 0740) Cardiac Rhythm:  [-] Normal sinus rhythm (01/10 0342) Resp:  [13-22] 21 (01/10 0740) BP: (129-151)/(76-93) 151/85 mmHg (01/10 0740) SpO2:  [99 %-100 %] 100 % (01/10 0740) Weight:  [57.244 kg (126 lb 3.2 oz)] 57.244 kg (126 lb 3.2 oz) (01/10 0342)  CBC:  Recent Labs Lab 12/03/15 2150  12/06/15 0417 12/08/15 0345  WBC 5.3  < > 8.4 7.0  NEUTROABS 3.3  --   --   --   HGB 12.6  < > 12.4 13.5  HCT 37.6  < > 37.8 40.7  MCV 101.6*  < > 102.2* 102.5*  PLT 132*  < > 144* 147*  < > = values in this interval not displayed.  Basic Metabolic Panel:   Recent Labs Lab 12/08/15 0345 12/09/15 0511  NA 139 140  K 4.0 3.7  CL 105 103  CO2 27 28  GLUCOSE 107* 93  BUN 6 9  CREATININE 0.57 0.53  CALCIUM 9.3 8.6*  MG 1.9  --     Lipid Panel:     Component Value Date/Time   CHOL 232* 12/04/2015 0823   TRIG 68 12/04/2015 0823   HDL 70 12/04/2015 0823   CHOLHDL 3.3 12/04/2015 0823   VLDL 14 12/04/2015 0823   LDLCALC 148* 12/04/2015 0823   HgbA1c:  Lab Results  Component Value Date   HGBA1C 4.9 12/08/2015   Urine Drug Screen: No results found for: LABOPIA, COCAINSCRNUR, LABBENZ, AMPHETMU, THCU, LABBARB    IMAGING  Dg Chest 2 View 12/06/2015   1. No acute cardiopulmonary abnormalities. 2. Thoracic spondylosis   Mr Brain Wo Contrast 12/07/2015   Small areas of acute infarct in the right temporoparietal lobe just above the tentorium. No evidence of venous sinus thrombosis. These may be embolic given the history of recent cardiac catheterization. Atrophy and chronic microvascular ischemia. Chronic micro hemorrhage left parietal lobe and left cerebellum likely due to  chronic hypertension.   Carotid Doppler   There is 1-39% bilateral ICA stenosis. Vertebral artery flow is antegrade.    2D echo  - Left ventricle: Mobile echodensity 10 x 6 mm attached tointerventricular septum, below LVOT This was present in echo from2014. Slightly more prominent nowNot clearly associated with chordae. QUestion fibroelastoma.Consider TEE to define. The cavity size was normal. Systolic function was normal. The estimated ejection fraction was in therange of 60% to 65%. - Mitral valve: There was mild regurgitation.   PHYSICAL EXAM Elderly Caucasian lady not in distress.  . Afebrile. Head is nontraumatic. Neck is supple without bruit.    Cardiac exam no murmur or gallop. Lungs are clear to auscultation. Distal pulses are well felt. Neurological Exam ;  Awake  Alert oriented x 3. Normal speech and language.eye movements full without nystagmus.fundi were not visualized. Vision acuity   appear normal.Visual fileds show partial lefthemianopsia Hearing is normal. Palatal movements are normal. Face symmetric. Tongue midline. Normal strength, tone, reflexes and coordination. Normal sensation. Gait deferred.  ASSESSMENT/PLAN [name] is a 75 y.o. 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 She did not receive IV t-PA due to unknown time last known well.   Stroke:  right posterior temporoparietal infarct embolic most likely as a result of recent cardiac cath. She does have other possible etiologies including metastatic ovarian cancer, known hx of PAF and fibroelastoma.  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 Stroke recommends that she treated with anticoagulation, eliquis preferred  Patient counseled to be compliant with her antithrombotic medications  Ongoing aggressive stroke risk factor management  Therapy recommendations:  No PT, OP OT  Disposition:  Home with OP therapies  Patient advised not to drive at this time   Follow up with Dr. Leonie Man, stroke clinic, in 2 months. Order written.   Paroxysmal Atrial Fibrillation  Not active within the past 5 years  Home anticoagulation:  None (at request of pt) -  See note 06/25/2015 Dr. Ellyn Hack We compromised using aspirin plus Plavix for stroke prophylaxis as opposed to using full anticoagulation. Currently these medications on hold. 2 thrombocytopenia. She still is reluctant to go to full anticoagulation, especially while she is undergoing chemotherapy. I defer starting and stopping of Plavix to her oncologist. Blood consider the possibility of having full anticoagulation with recurrence of malignancy.  CHA2DS2-VASc Score = 6, ?2 oral anticoagulation recommended   Age in Years:  64-74   +1   Sex:  Female   Female   +1     Hypertension History:  yes   +1      Diabetes Mellitus:  0    Congestive Heart Failure History:  0   Vascular Disease History:  Yes +1    Stroke/TIA/Thromboembolism History:  yes   +2   Based on score and other embolic etiologies, recommend anticoagulation   Patient reluctant to make changes in antithromboitic treatment at this time. She has f/uappt with DR. Ellyn Hack next week where she wants to discuss further, as well as with her oncologist. Dr. Leonie Man discussed with Dr. Martinique and Dr. Sherral Hammers  Hypertension, Hypertensive Urgency  (resolved)  Stable  Hyperlipidemia  Home meds:  No statin  LDL 148, goal < 70  Intolerant to lipitor (dermatitis)  Recommend the addition of statin at discharge  Other Stroke Risk Factors  Advanced age  Coronary artery disease - ACS w/ NSTEMI this admission  Other Active Problems  Fever unknown origin  Constipation  ovarian CA w/ mets to speen & pelvis - chemo stopped due to drug-induced hypertension  Macrocytosis  Chemo induced pancytopenia  Dedyrhation, resolved  Hypothyroidism  hypokalemia  Hospital day # [place] for Pager information 12/09/2015 10:20 AM  I have personally examined this patient, reviewed notes, independently viewed imaging studies, participated in medical decision making and plan of care. I have made any additions or clarifications directly to the above note. Agree with note above.  Patient reluctant to make changes in antithromboitic treatment at this time. She has f/uappt with DR. Ellyn Hack next week where she wants to  discuss further, as well as with her oncologist [name], [place] Pager: [phone] 12/09/2015 2:09 PM   To contact Stroke Continuity provider, please refer to [email or web address] After hours, contact General Neurology

## 2015-12-09 NOTE — Progress Notes (Signed)
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  Medical therapy was planned after cardiac cath and final diagnosis was NSTEMI. Pt spiked a temperature up to 101.6 F on 1/6 then again on 1/7 after cath. Blood cultures were obtained. In addition the patient began complaining of headache and generalized malaise without other localizing symptoms.12/07/15 MRI: Small areas of acute infarct in the right temporoparietal lobe just above the tentorium. Chronic micro hemorrhage left parietal lobe and left cerebellum likely due to chronic hypertension.    PT Comments    Pt admitted with above diagnosis. Pt currently with functional limitations due to balance and endurance deficits. Pt able to ambulate with RW with Modif I.  Pt states she is not at her baseline with some weakness noted.  Will ask MD for an OUtpt PT referral for pt for balance program.  Will sign off as pt met goals and is discharging today.      Follow Up Recommendations  Outpatient PT;Supervision - Intermittent     Equipment Recommendations  None recommended by PT    Recommendations for Other Services       Precautions / Restrictions Precautions Precautions: Fall (due to generalized weakness and Left inferior homonymous quadranopsia) Precaution Comments: droplet Restrictions Weight Bearing Restrictions: No    Mobility  Bed Mobility Overal bed mobility: Modified Independent                Transfers Overall transfer level: Modified independent                  Ambulation/Gait Ambulation/Gait assistance: Modified independent (Device/Increase  time) Ambulation Distance (Feet): 50 Feet Assistive device: Rolling walker (2 wheeled) Gait Pattern/deviations: WFL(Within Functional Limits)   Gait velocity interpretation: Below normal speed for age/gender General Gait Details: pt felt more comfortable with RW and helped with energy conservation, pt very happy to be OOB and moving.  Pt walked to closet and got shoes out. She is going home today.   Stairs            Wheelchair Mobility    Modified Rankin (Stroke Patients Only)       Balance           Standing balance support: Bilateral upper extremity supported;During functional activity Standing balance-Leahy Scale: Fair Standing balance comment: can stand statically without UEu support.                     Cognition Arousal/Alertness: Awake/alert Behavior During Therapy: WFL for tasks assessed/performed Overall Cognitive Status: Within Functional Limits for tasks assessed (however unaware of her left inferior homonymous quadranopsia)                      Exercises      General Comments General comments (skin integrity, edema, etc.): Discussed Outpt PT for pt to reach baseline level.  Pt agrees.        Pertinent Vitals/Pain Pain Assessment: No/denies pain  VSS    Home Living                      Prior Function  PT Goals (current goals can now be found in the care plan section) Progress towards PT goals: Goals met/education completed, patient discharged from PT    Frequency       PT Plan Discharge plan needs to be updated    Co-evaluation             End of Session Equipment Utilized During Treatment: Gait belt Activity Tolerance: Patient tolerated treatment well Patient left: in chair;with call bell/phone within reach     Time: 1046-1054 PT Time Calculation (min) (ACUTE ONLY): 8 min  Charges:  $Self Care/Home Management: 8-22                    G CodesIrwin Brakeman F 2015/12/24, 11:09  AM [facility] Acute Rehabilitation [phone] (pager)

## 2015-12-10 ENCOUNTER — Ambulatory Visit
Admission: RE | Admit: 2015-12-10 | Discharge: 2015-12-10 | Disposition: A | Discharge status: Home / Self Care | Payer: Medicare Other | Source: Ambulatory Visit | Attending: Radiation Oncology | Admitting: Radiation Oncology

## 2015-12-10 ENCOUNTER — Encounter: Payer: Self-pay | Admitting: Radiation Oncology

## 2015-12-10 ENCOUNTER — Telehealth: Payer: Self-pay | Admitting: *Deleted

## 2015-12-10 VITALS — BP 160/73 | HR 65 | Temp 98.0°F | Resp 20 | Ht 63.5 in | Wt 132.2 lb

## 2015-12-10 DIAGNOSIS — Z90722 Acquired absence of ovaries, bilateral: Secondary | ICD-10-CM | POA: Diagnosis not present

## 2015-12-10 DIAGNOSIS — Z888 Allergy status to other drugs, medicaments and biological substances status: Secondary | ICD-10-CM | POA: Insufficient documentation

## 2015-12-10 DIAGNOSIS — I10 Essential (primary) hypertension: Secondary | ICD-10-CM | POA: Insufficient documentation

## 2015-12-10 DIAGNOSIS — E785 Hyperlipidemia, unspecified: Secondary | ICD-10-CM | POA: Diagnosis not present

## 2015-12-10 DIAGNOSIS — C7889 Secondary malignant neoplasm of other digestive organs: Secondary | ICD-10-CM

## 2015-12-10 DIAGNOSIS — I480 Paroxysmal atrial fibrillation: Secondary | ICD-10-CM | POA: Diagnosis not present

## 2015-12-10 DIAGNOSIS — E039 Hypothyroidism, unspecified: Secondary | ICD-10-CM | POA: Insufficient documentation

## 2015-12-10 DIAGNOSIS — Z885 Allergy status to narcotic agent status: Secondary | ICD-10-CM | POA: Insufficient documentation

## 2015-12-10 DIAGNOSIS — Z85828 Personal history of other malignant neoplasm of skin: Secondary | ICD-10-CM | POA: Insufficient documentation

## 2015-12-10 DIAGNOSIS — Z7982 Long term (current) use of aspirin: Secondary | ICD-10-CM | POA: Insufficient documentation

## 2015-12-10 DIAGNOSIS — Z79899 Other long term (current) drug therapy: Secondary | ICD-10-CM | POA: Insufficient documentation

## 2015-12-10 DIAGNOSIS — I252 Old myocardial infarction: Secondary | ICD-10-CM | POA: Insufficient documentation

## 2015-12-10 DIAGNOSIS — Z9221 Personal history of antineoplastic chemotherapy: Secondary | ICD-10-CM | POA: Insufficient documentation

## 2015-12-10 DIAGNOSIS — Z90710 Acquired absence of both cervix and uterus: Secondary | ICD-10-CM | POA: Diagnosis not present

## 2015-12-10 DIAGNOSIS — Z7902 Long term (current) use of antithrombotics/antiplatelets: Secondary | ICD-10-CM | POA: Diagnosis not present

## 2015-12-10 DIAGNOSIS — C562 Malignant neoplasm of left ovary: Secondary | ICD-10-CM | POA: Diagnosis not present

## 2015-12-10 DIAGNOSIS — R590 Localized enlarged lymph nodes: Secondary | ICD-10-CM | POA: Diagnosis not present

## 2015-12-10 DIAGNOSIS — Z881 Allergy status to other antibiotic agents status: Secondary | ICD-10-CM | POA: Diagnosis not present

## 2015-12-10 DIAGNOSIS — C7989 Secondary malignant neoplasm of other specified sites: Secondary | ICD-10-CM

## 2015-12-10 DIAGNOSIS — C561 Malignant neoplasm of right ovary: Secondary | ICD-10-CM | POA: Insufficient documentation

## 2015-12-10 DIAGNOSIS — C563 Malignant neoplasm of bilateral ovaries: Secondary | ICD-10-CM

## 2015-12-10 HISTORY — DX: Non-ST elevation (NSTEMI) myocardial infarction: I21.4

## 2015-12-10 LAB — RESPIRATORY VIRUS PANEL
ADENOVIRUS: NEGATIVE
INFLUENZA A: NEGATIVE
INFLUENZA B 1: NEGATIVE
Metapneumovirus: NEGATIVE
PARAINFLUENZA 3 A: NEGATIVE
Parainfluenza 1: NEGATIVE
Parainfluenza 2: NEGATIVE
RESPIRATORY SYNCYTIAL VIRUS A: NEGATIVE
RESPIRATORY SYNCYTIAL VIRUS B: NEGATIVE
RHINOVIRUS: NEGATIVE

## 2015-12-10 NOTE — Progress Notes (Signed)
Radiation Oncology         [phone] ________________________________  Name: [name] MRN: [id number]  Date: 12/10/2015  DOB: [birth year]  SE:GBTDV,VOHY Jerilynn Mages, MD  Shon Baton, MD     REFERRING PHYSICIAN: Dr. Marko Plume  DIAGNOSIS: The encounter diagnosis was Ovarian cancer, bilateral ([place]).   HISTORY OF PRESENT ILLNESS:[name] is a 74 y.o. female seen at the request of Dr. Marko Plume for insertion of radiotherapy for persistent ovarian cancer. The patient was originally diagnosed in March 2014 when she underwent export her laparotomy, TAH, BSO, tumor debulking. 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 She has since been followed under the care of Dr. Fermin Schwab and Dr. Marko Plume and her chemotherapy regimens have included, 6 cycles of dose dense carboplatin and Taxol which she completed  in 2014. She was an NED until her CA-125 was noted to rise to 59 and April 2016 and she subsequently underwent a PET/CT revealing hypermetabolic activity within the medial spleen measuring 4.1 x 2.8 cm with an SUV of 11.9, adenopathy of both sidewalls of the pelvis measuring 1.4 x 1.7 with an SUV of 11.4 and 1.3 cm with an SUV of 9.4. A lesion at the vaginal cuff measured 1.2 centimeters with an SUV of 7.1. Several soft tissue densities were noted in the omentum measuring 1 cm in greatest dimension. An 8 mm aortocaval node was noted with an SUV of 5. No other focal abnormalities concerning for disease were present. She went on to receive 8 cycles of carboplatin, Taxol and bevacizumab q 21 days, which she had to discontinue in November 2016 due to uncontrolled hypertension. Interval treatment CT scans have been used to follow the patient's progress and in October 2016 her pelvic adenopathy had resolved, and disease around the spleen had also improved. Her most recent imaging was a PET scan on 11/18/15 revealing a non hypermetabolic borderline enlarged right hilar node, the target  lesion in the splenic hilum measured 2.8 x 2.5 cm with an SUV of 10.6, which was decreased in comparison by size and activity from her previous PET in May 2016. Within the right pelvic sidewall, there was a 1 cm hypermetabolic node with an SUV of 6.3, but previously 1.6 cm and with an SUV of 9.7. Dr. Marko Plume Dr. Cherly Beach have discussed the patient's case, and as she is on chemotherapy hold due to her hypertension, it was suggested that she meet in consultation for discussion of the potential role of radiotherapy by Dr. Lisbeth Renshaw. Of note in the last 24 hours, she was diagnosed with an NSTEMI, and subacute CVA without evidence of metastatic disease to the Brain. She underwent PCI that did not reveal an obstructive plaque.     PREVIOUS RADIATION THERAPY: No   PAST MEDICAL HISTORY:  has a past medical history of Hypertension; Dyslipidemia; Hypothyroidism; Arthritis; PAF (paroxysmal atrial fibrillation) ([place]); Complication of anesthesia; History of skin cancer; Ovarian cancer ([place]) (01/2013); and NSTEMI (non-ST elevated myocardial infarction) ([place]) (12/09/15).  She is also BRCA negative per report.  PAST SURGICAL HISTORY: Past Surgical History  Procedure Laterality Date  . Bilateral carpal tunnel repair  2007  . Dilation and curettage of uterus  1969  . Surgery for ruptured ovarian cyst  1969  . Rhinoplasty for fractured nose  1986  . Left knee arthroscopy   2011  . Total hip arthroplasty  02/25/2012    Procedure: TOTAL HIP ARTHROPLASTY ANTERIOR APPROACH;  Surgeon: Mcarthur Rossetti, MD;  Location: [facility];  Service: Orthopedics;  Laterality: Right;  . Tonsillectomy  1962  . Laparotomy Bilateral 02/13/2013    Procedure: EXPLORATORY LAPAROTOMY TOTAL ABDOMINAL HYSTERECTOMY BILATERAL SALPINGO-OOPHORECTOMY, Partial Rectal Resection with Reanastamosis;  Surgeon: Alvino Chapel, MD;  Location: [facility];  Service: Gynecology;  Laterality: Bilateral;  . Omentectomy  02/13/2013    Procedure:  OMENTECTOMY;  Surgeon: Alvino Chapel, MD;  Location: [facility];  Service: Gynecology;;  . Lymphadenectomy Right 02/13/2013    Procedure: PEVLIC  LYMPHADENECTOMY, DEBULKING right pelvic tumor nodules;  Surgeon: Alvino Chapel, MD;  Location: [facility];  Service: Gynecology;  Laterality: Right;  . Tah/bso/tumor debulking with right pelvic lnd  01/2013  . Transthoracic echocardiogram  May 2014    Normal LV size function. EF 60-65%. Grade 1 diastolic function. Mild MR and mildly elevated PA pressures of 37 mmHg per  . Nm myoview ltd  June 2010     subbmaximal with no ischemia or infarction.  . Total knee arthroplasty Left 01/03/2015    Procedure: LEFT TOTAL KNEE ARTHROPLASTY;  Surgeon: Mcarthur Rossetti, MD;  Location: [facility];  Service: Orthopedics;  Laterality: Left;  . Cardiac catheterization N/A 12/05/2015    Procedure: Left Heart Cath and Coronary Angiography;  Surgeon: Belva Crome, MD;  Location: [facility];  Service: Cardiovascular;  Laterality: N/A;     FAMILY HISTORY: family history includes AAA (abdominal aortic aneurysm) in her father and paternal grandmother; Basal cell carcinoma in her mother; Heart Problems in her maternal grandfather and maternal uncle; Hypertension in her mother; Other in her daughter; Prostate cancer in her other; Prostate cancer (age of onset: 50) in her maternal uncle.   SOCIAL HISTORY:  reports that she has never smoked. She has never used smokeless tobacco. She reports that she does not drink alcohol or use illicit drugs.   ALLERGIES: Coreg; Ceftin; Clonidine derivatives; Codeine; Flexeril; Levaquin; Lipitor; Lisinopril; Tegaderm ag mesh; Tape; and Ultram   MEDICATIONS:  Current Outpatient Prescriptions  Medication Sig Dispense Refill  . amiodarone (PACERONE) 400 MG tablet Take 1 tablet (400 mg total) by mouth 2 (two) times daily. 60 tablet 0  . amLODipine (NORVASC) 5 MG tablet Take 1 tablet (5 mg total) by mouth daily. 30 tablet 0    . aspirin EC 81 MG tablet Take 81 mg by mouth every morning.     . bisacodyl (DULCOLAX) 5 MG EC tablet Take 5 mg by mouth daily as needed for moderate constipation. Reported on 11/20/2015    . clopidogrel (PLAVIX) 75 MG tablet TAKE 1 TABLET BY MOUTH AT BEDTIME 90 tablet 3  . desonide (DESOWEN) 0.05 % cream Apply 1 application topically 2 (two) times daily.   1  . diphenhydrAMINE (SOMINEX) 25 MG tablet Take 25-50 mg by mouth at bedtime as needed for sleep.    Marland Kitchen docusate sodium (COLACE) 100 MG capsule Take 200 mg by mouth 2 (two) times daily.     Marland Kitchen estradiol (ESTRACE) 1 MG tablet Take 1 tablet (1 mg total) by mouth every morning. PT STATES SHE WANTS ESTRACE--NOT THE GENERIC 90 tablet 3  . Glucosamine HCl (GLUCOSAMINE PO) Take 1,000 mg by mouth every morning.     . irbesartan (AVAPRO) 300 MG tablet Take 1 tablet (300 mg total) by mouth daily. 30 tablet 0  . levothyroxine (SYNTHROID) 88 MCG tablet Take 88 mcg by mouth daily before breakfast.    . lidocaine-prilocaine (EMLA) cream Apply to Porta-Cath site 1-2 hours prior to access as directed. 30 g  2  . loratadine (CLARITIN) 10 MG tablet Take 10 mg by mouth daily as needed for allergies. Reported on 11/20/2015    . magnesium hydroxide (MILK OF MAGNESIA) 800 MG/5ML suspension Take 30 mLs by mouth daily as needed for constipation. Reported on 11/20/2015    . Melatonin 5 MG CAPS Take 1 capsule by mouth at bedtime as needed (sleep).     . Menthol, Topical Analgesic, (ICY HOT EX) Apply 1 application topically daily as needed (pain.).    Marland Kitchen metoprolol tartrate (LOPRESSOR) 25 MG tablet Take 1 tablet (25 mg total) by mouth 2 (two) times daily. 60 tablet 0  . Multiple Vitamin (MULTIVITAMIN WITH MINERALS) TABS tablet Take 1 tablet by mouth every morning.     . ondansetron (ZOFRAN) 8 MG tablet Take 1 tablet (8 mg total) by mouth every 8 (eight) hours as needed for nausea or vomiting (Will not make drowsy). 30 tablet 1  . pantoprazole (PROTONIX) 40 MG tablet Take  1 tablet (40 mg total) by mouth daily. 90 tablet 1  . Polyethyl Glycol-Propyl Glycol (SYSTANE OP) Place 1 drop into both eyes 2 (two) times daily.     . polyethylene glycol (MIRALAX / GLYCOLAX) packet Take 17 g by mouth every morning.     . potassium chloride (K-DUR) 10 MEQ tablet Take 10 mEq by mouth every Monday, Wednesday, and Friday.    Marland Kitchen spironolactone (ALDACTONE) 25 MG tablet Take 25 mg by mouth daily.     Marland Kitchen amoxicillin (AMOXIL) 500 MG capsule Reported on 12/10/2015     No current facility-administered medications for this encounter.     REVIEW OF SYSTEMS: On review of systems, the patient states that overall she is very tired from the last 24 hours. She denies any current chest pain, shortness of breath, fevers, or chills. She denies any abdominal pain, fullness, early satiety,nausea, vomiting, bowel or bladder dysfunction. She denies any vaginal bleeding. A complete review of systems is obtained and is otherwise negative.    PHYSICAL EXAM:  height is 5' 3.5" (1.613 m) and weight is 132 lb 3.2 oz (59.966 kg). Her oral temperature is 98 F (36.7 C). Her blood pressure is 160/73 and her pulse is 65. Her respiration is 20 and oxygen saturation is 100%.    ECOG = 0  0 - Asymptomatic (Fully active, able to carry on all predisease activities without restriction)  1 - Symptomatic but completely ambulatory (Restricted in physically strenuous activity but ambulatory and able to carry out work of a light or sedentary nature. For example, light housework, office work)  2 - Symptomatic, <50% in bed during the day (Ambulatory and capable of all self care but unable to carry out any work activities. Up and about more than 50% of waking hours)  3 - Symptomatic, >50% in bed, but not bedbound (Capable of only limited self-care, confined to bed or chair 50% or more of waking hours)  4 - Bedbound (Completely disabled. Cannot carry on any self-care. Totally confined to bed or chair)  5 - Death    Eustace Pen MM, Creech RH, Tormey DC, et al. [id number]). "Toxicity and response criteria of the [facility]". [place] Oncol. 5 (6): 649-55  In general this is a well appearing caucasian female in no acute distress. She is alert and oriented x3 and appropriate during our visit. Cardiovascular exam reveals that she is in normal sinus rhythm without clicks, rubs, or murmurs. Chest is clear to auscultation bilaterally. Lymphatic review is  negative for palpable adenopathy in cervical, supraclavicular, or axillary chains. The abdomen is intact grossly without visible abnormalities. Bowel sounds are normoactive in all quadrants and is soft, nontender, nondistended without palpable organomegaly, tumor, or fascial defects. Her skin is intact with ecchymoses of her right wrist and hand from her recent PCI. No other focal lesions are noted. Lower extremities are negative for pretibial pitting edema, cyanosis, or clubbing.   LABORATORY DATA:  Lab Results  Component Value Date   WBC 7.0 12/08/2015   HGB 13.5 12/08/2015   HCT 40.7 12/08/2015   MCV 102.5* 12/08/2015   PLT 147* 12/08/2015   Lab Results  Component Value Date   NA 140 12/09/2015   K 3.7 12/09/2015   CL 103 12/09/2015   CO2 28 12/09/2015   Lab Results  Component Value Date   ALT 12* 12/09/2015   AST 19 12/09/2015   ALKPHOS 49 12/09/2015   BILITOT 0.7 12/09/2015      RADIOGRAPHY: Dg Chest 2 View  12/06/2015  CLINICAL DATA:  History of ovarian cancer.  Fever for 3 days. EXAM: CHEST  2 VIEW COMPARISON:  12/04/2015 FINDINGS: There is a right chest wall port a catheter with tip in the projection of the SVC. The heart size appears normal. There is no pleural effusion or edema could no airspace consolidation is identified. Spondylosis is present within the thoracic spine. IMPRESSION: 1. No acute cardiopulmonary abnormalities. 2. Thoracic spondylosis noted. Electronically Signed   By: Kerby Moors M.D.   On: 12/06/2015 14:55    Dg Chest 2 View  12/04/2015  CLINICAL DATA:  Sudden onset of mid chest pain this evening. EXAM: CHEST  2 VIEW COMPARISON:  Chest radiograph 11/03/2015, chest CT 11/06/2015 FINDINGS: Tip of the right chest port in the mid SVC. The cardiomediastinal contours are normal. Pulmonary vasculature is normal. No consolidation, pleural effusion, or pneumothorax. No acute osseous abnormalities are seen. Degenerative change seen in the thoracic spine. IMPRESSION: No acute pulmonary process. Electronically Signed   By: Jeb Levering M.D.   On: 12/04/2015 00:41   Ct Head W Wo Contrast  11/26/2015  CLINICAL DATA:  Ovarian cancer, continued surveillance. EXAM: CT HEAD WITHOUT AND WITH CONTRAST TECHNIQUE: Contiguous axial images were obtained from the base of the skull through the vertex without and with intravenous contrast CONTRAST:  75m OMNIPAQUE IOHEXOL 300 MG/ML  SOLN COMPARISON:  MR brain 02/11/2011.  Most recent PET scan 11/18/2015. FINDINGS: No evidence for acute infarction, hemorrhage, mass lesion, hydrocephalus, or extra-axial fluid. Mild cerebral and cerebellar atrophy. Hypoattenuation of white matter, likely chronic microvascular ischemic change. No features suggestive of vasogenic edema. Post infusion, no abnormal enhancement of the brain or visible meninges. Major dural venous sinuses appear patent. No calvarial lesions are evident. Unremarkable skull base, sinuses, and mastoids. BILATERAL flared IACs are symmetric and unchanged from 2012. IMPRESSION: Mild atrophy and suspected small vessel disease. No acute intracranial findings. No features suggestive of intracranial metastatic disease. No acute cause is seen for dizziness. Electronically Signed   By: JStaci RighterM.D.   On: 11/26/2015 08:33   Mr Brain Wo Contrast  12/07/2015  CLINICAL DATA:  Visual disturbance. Double vision. Recent cardiac catheterization 12/05/2015. EXAM: MRI HEAD WITHOUT CONTRAST TECHNIQUE: Multiplanar, multiecho pulse sequences of  the brain and surrounding structures were obtained without intravenous contrast. COMPARISON:  CT head 11/26/2015 FINDINGS: Small area of restricted diffusion in the right posterior temporal/ parietal lobe. Several small areas of restricted diffusion are present just above the tentorium.  The largest measures 1 cm. Others measure 3-5 mm. There appears to be normal signal in the right transverse sinus without evidence of venous sinus thrombosis. No other areas of acute infarct. Generalized atrophy. Moderate chronic microvascular ischemic change in the white matter. Brainstem and basal ganglia intact. No cerebellar infarct. Chronic micro hemorrhage left temporoparietal lobe. Micro hemorrhage left cerebellum. No hematoma. Negative for mass or edema.  No shift of the midline structures Paranasal sinuses clear. IMPRESSION: Small areas of acute infarct in the right temporoparietal lobe just above the tentorium. No evidence of venous sinus thrombosis. These may be embolic given the history of recent cardiac catheterization. Atrophy and chronic microvascular ischemia. Chronic micro hemorrhage left parietal lobe and left cerebellum likely due to chronic hypertension. Electronically Signed   By: Franchot Gallo M.D.   On: 12/07/2015 19:38   Nm Pet Image Restag (ps) Skull Base To Thigh  11/18/2015  CLINICAL DATA:  Subsequent treatment strategy for poorly differentiated ovarian cancer. EXAM: NUCLEAR MEDICINE PET SKULL BASE TO THIGH TECHNIQUE: 6.4 mCi F-18 FDG was injected intravenously. Full-ring PET imaging was performed from the skull base to thigh after the radiotracer. CT data was obtained and used for attenuation correction and anatomic localization. FASTING BLOOD GLUCOSE:  Value: 98 mg/dl COMPARISON:  04/03/2015 PET-CT. FINDINGS: NECK No hypermetabolic lymph nodes in the neck. No appreciable change in diffuse thyroid hypermetabolism asymmetrically involving the right thyroid lobe (max SUV 7.5, previously 7.1), without  discrete thyroid nodule, most suggestive of diffuse thyroiditis. CHEST No hypermetabolic axillary, mediastinal or hilar nodes. The borderline enlarged right hilar node described on the 11/06/2015 CT study is non hypermetabolic. Right internal jugular MediPort terminates at the cavoatrial junction. Left anterior descending and right coronary atherosclerosis. Calcified subcentimeter granuloma in the right middle lobe. No acute consolidative airspace disease or significant pulmonary nodules. ABDOMEN/PELVIS There is a 2.8 x 2.5 cm hypermetabolic tumor implant near the splenic hilum between the stomach and spleen (series 4/image 94) with max SUV 10.6, decreased in size and metabolism from 3.9 x 3.7 cm with max SUV of 11.9 on 04/03/2015 PET-CT and slightly decreased in size from 3.0 x 2.7 cm on 11/06/2015. There is a 1.0 cm hypermetabolic node in the right deep pelvic side wall (series 4/image 154) with max SUV 6.3, decreased in size and metabolism from 1.6 cm with max SUV 9.7 on 04/03/2015 PET-CT, and which was obscured by streak artifact on the 11/06/2015 CT study. There are no additional residual hypermetabolic tumor implants in the peritoneal cavity. There are no additional hypermetabolic lymph nodes in the abdomen or pelvis. No abnormal hypermetabolic activity within the liver, pancreas, adrenal glands, or spleen. Small hiatal hernia. Atherosclerotic nonaneurysmal abdominal aorta. SKELETON No focal hypermetabolic activity to suggest skeletal metastasis. IMPRESSION: 1. Residual hypermetabolic tumor implant between the stomach and spleen, significantly decreased in size and mildly decreased in metabolism compared to 04/03/2015 PET-CT, and slightly decreased in size since 11/06/2015. 2. Residual hypermetabolic right deep pelvic side wall lymph node, with partial treatment response compared to 04/03/2015 PET-CT. 3. No new sites of hypermetabolic metastatic disease. 4. Stable diffuse thyroid hypermetabolism without  discrete thyroid nodule, most suggestive of diffuse thyroiditis. Recommend correlation with serum thyroid function tests. Electronically Signed   By: Ilona Sorrel M.D.   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   PLAN:   Dr. Lisbeth Renshaw discusses the findings from the patient's CT imaging and PET imaging. At this point in time, the patient is completely asymptomatic from her disease. While we hope she will continue to note benefit from systemic treatments, Dr. Lisbeth Renshaw discusses that there is a likelihood that she may require additional intervention if her disease progresses. He discusses that we would hold off on radiation therapy at the present time, and follow her progress peripherally. If she is unable to receive additional systemic treatments, then we would reconsider our strategy with this patient. I will plan to follow up with the patient in the next 3 months by phone, but she is given contact information should she have any questions or concerns regarding today's discussion.    The above documentation reflects my direct findings during this shared patient visit. Please see the separate note by Dr. Lisbeth Renshaw on this date for the remainder of the patient's plan of care.  Carola Rhine, [facility]    **Disclaimer: This note was dictated with voice recognition software. Similar sounding words can inadvertently be transcribed and this note may contain transcription errors which may not have been corrected upon publication of note.**  Please see the note from Shona Simpson, PA-C from today's visit for more details of today's encounter.  I have personally performed a face to face diagnostic evaluation on this patient and devised the following assessment and plan. Given  the nature of her disease and her current lack of symptoms, we discussed holding off on radiation treatment currently. She is scheduled to see medical oncology in the near future. The patient today expressed an interest in further treatment and does wish to remain aggressive in her treatment. Therefore, she indicated an interest in proceeding with palliative radiation treatment if she is not eligible for systemic treatment additionally in the near future. We will therefore follow-up on her upcoming discussions with oncology and would be more than happy to see the patient in the near future if she wishes to further discuss radiation treatment. Otherwise, we can reserve radiation treatment for progressive disease.  ------------------------------------------------  Jodelle Gross, MD, PhD

## 2015-12-10 NOTE — Telephone Encounter (Signed)
Called patient home,   Asking about patient status and if they wanted to reschedule appt today, Patient  stated  She wanted to keep the appt today, she is a little unsteady, offered  To ask for w/c at lobby when she  Checks in , patient stated she would and thanked this RN for calling and checking on her  9:21 AM

## 2015-12-10 NOTE — Patient Instructions (Signed)
Contact our office if you have any questions following today's appointment: [phone].  

## 2015-12-10 NOTE — Progress Notes (Signed)
Please see the Nurse Progress Note in the MD Initial Consult Encounter for this patient. 

## 2015-12-11 ENCOUNTER — Telehealth: Payer: Self-pay | Admitting: Cardiology

## 2015-12-11 LAB — CULTURE, BLOOD (ROUTINE X 2)
CULTURE: NO GROWTH
Culture: NO GROWTH

## 2015-12-11 NOTE — Telephone Encounter (Signed)
Glyn Ade at 12/11/2015 11:30 AM     Status: Signed       Expand All Collapse All   Pt said while she was in the hospital she was put on Amiodarone. Pt says she does not want to take it,because of the side effects.        Not having any side affectss Since she has read the information about the serious reactions that can occur she doesn't want to take Amiodarone.

## 2015-12-11 NOTE — Telephone Encounter (Signed)
Patient also wanted Dr. Ellyn Hack know that today while just making phone calls she gets short of breath and she knows some BP medication has done that to her in the past.

## 2015-12-11 NOTE — Telephone Encounter (Signed)
OK - I know that she was just in the hospital - not sure what is new or old.  Leonie Man, MD

## 2015-12-11 NOTE — Telephone Encounter (Signed)
Pt said while she was in the hospital she was put on Amiodarone. Pt says she does not want to take it,because of the side effects.

## 2015-12-12 ENCOUNTER — Encounter: Payer: Self-pay | Admitting: Radiation Oncology

## 2015-12-13 ENCOUNTER — Telehealth: Payer: Self-pay | Admitting: Adult Health

## 2015-12-13 NOTE — Telephone Encounter (Signed)
Received phone call from patient, who was upset about having to take amiodarone. She states that she read the package insert and saw all of the side effects and dangers associated with taking it. She wanted to discuss in detail reasons for taking amiodarone, and alternatives. She states she is upset that no one his sat down and spoken with her about this.  It has been documented that she had a thorough conversation with a pharmacist prior to being discharged. She, however, wishes to talk at length about it concerning other alternatives. She is very uncomfortable taking amiodarone is wanted to let us know that she is no longer going to take it. She wanted me to go into detail about different alternatives, and if she can adjust the dose.  She has an appointment with her physician on Tuesday, January 17 for hospital follow-up. I have advised her that I cannot force her to take the amiodarone if she is uncomfortable with it, but it has been recommended by her cardiologist that she continue do so. If she chooses not to. She will have to discuss with her physician alternatives as this is not something that can be discussed over the phone on a weekend. She did not seem satisfied but is willing to talk to her physician about it when she sees him on Tuesday.

## 2015-12-16 ENCOUNTER — Ambulatory Visit (INDEPENDENT_AMBULATORY_CARE_PROVIDER_SITE_OTHER): Payer: 59 | Admitting: Cardiology

## 2015-12-16 ENCOUNTER — Encounter: Payer: Self-pay | Admitting: Cardiology

## 2015-12-16 VITALS — BP 150/70 | HR 61 | Ht 63.0 in | Wt 130.1 lb

## 2015-12-16 DIAGNOSIS — I480 Paroxysmal atrial fibrillation: Secondary | ICD-10-CM | POA: Diagnosis not present

## 2015-12-16 DIAGNOSIS — I10 Essential (primary) hypertension: Secondary | ICD-10-CM

## 2015-12-16 DIAGNOSIS — Z79899 Other long term (current) drug therapy: Secondary | ICD-10-CM

## 2015-12-16 DIAGNOSIS — I639 Cerebral infarction, unspecified: Secondary | ICD-10-CM

## 2015-12-16 DIAGNOSIS — E785 Hyperlipidemia, unspecified: Secondary | ICD-10-CM

## 2015-12-16 MED ORDER — AMIODARONE HCL 200 MG PO TABS: 100.0000 mg | ORAL_TABLET | Freq: Every day | ORAL | Status: DC

## 2015-12-16 NOTE — Patient Instructions (Addendum)
CONTINUE WITH AMIODARONE 400 MG TWICE A DAY UNTIL END OF THE WEEK , THEN  AMIODARONE 200 MG DAILY UNTIL THE END OF BOTTLE THEN GO TO 100 MG ( 1/2/ TABLET OF THE 200 MG) DAILY .   YOUR CHOICE TO TAKE AMLODIPINE 10 MG IN THE MORNING AND IRBESARTAN 300 MG IN THE EVENING , OR YOU REVERSE THE PREVIOUS  Your physician has recommended that you have a pulmonary function test towards the end of Jan 2017 at [facility]  outpatient. Pulmonary Function Tests are a group of tests that measure how well air moves in and out of your lungs.    Your physician wants you to follow-up in [facility] 2017 WITH DR [place] - 30 MIN You will receive a reminder letter in the mail two months in advance. If you don't receive a letter, please call our office to schedule the follow-up appointment.

## 2015-12-16 NOTE — Progress Notes (Signed)
PCP: Precious Reel, MD  Clinic Note: Chief Complaint  Patient presents with  . Follow-up    6 mo rov; hospital follow-up  . Atrial Fibrillation  . Cerebrovascular Accident  . Coronary Artery Disease    Small vessel, diagonal disease; hypertensive emergency    HPI: [name] is a 75 y.o. female with a PMH below who presents today for hospital follow-up. She has a history of paroxysmal atrial fib, and difficult to control blood pressure. Leading up to her recent hospitalization her atrial fibrillation was managed by beta blocker and Rythmol. She had refused anticoagulation, and was using Plavix. She has recurrent ovarian cancer being treated by oncology. Most recent attempt with Avastin.  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  She was being followed by Tommy Medal, Pharm-d for medication titratrion.  Recent Hospitalizations: 1/5-08/2015 -- [name] was admitted on January 5 with hypertensive emergency and elevated troponin.  It was noted that she has been having trouble regularly her blood pressures and was associated with changes in chemotherapy (Avastin). She presented emergency room with chest pain radiating to left shoulder, blood pressure was 210/100. She is noted to be in sinus rhythm. Troponin peaked at 1.13. She underwent cardiac catheterization on the sixth which revealed lesions in the both diagonal branches that were not felt to be PCI targets. Plan was for medical management. Also based on this, Rythmol was discontinued. The plan was for her to follow-up with me in clinic to discuss further therapy.  Unfortunately, on the second day following her catheterization, there was complaints of visual hallucinations and neurological disturbances. She is noted to have short runs of PAT and A. fib. They began amiodarone. Neurology consult was obtained for concern for possible stroke.  MRI  of the brain showed small areas of acute infarct in the right temporoparietal lobe just above the tentorium. Her thought to be likely cardioembolic from recent catheterization. There is also atrophy and chronic microvascular ischemia noted as well as chronic microhemorrhage in the left parietal lobe and left cerebellum due to chronic hypertension. She also was noted a fever of somewhat unknown origin thought be viral in nature. It was discussion as far as potentially starting her on full and a coagulation with a DOAC, however she was reluctant to do so prior to being seen in clinic. She indicates that the stroke doctor said that she didn't need to be on it. However the last stroke team progress note suggests that Drs. [place]ique, Sherral Hammers and [place] discussed options and decided on anticoagulation. She was discharged back on aspirin and Plavix.  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  Studies Reviewed:  Cardiac Cath 12/04/14:  1. 1st Diag lesion, 95% stenosed. 2. Ost 2nd Diag lesion, 75% stenosed. 3. Dist LAD lesion, 50% stenosed. 4. Prox RCA to Mid RCA lesion, 40% stenosed.    Right dominant coronary system.  Tortuous coronaries consistent with history of hypertension  Eccentric high-grade stenosis in the distal portion of the first diagonal.  Initial angiography demonstrated pruning of the distal LAD which seemed to significantly improve after intracoronary nitroglycerin. Therefore , I do not believe Spontaneous coronary dissection is present.  normal left ventricular systolic function.  2D Ech0 12/07/14:  EF 60-65%. There is a mobile echodensity 10 x 6 mm attached intraventricular septum. This is below the LVOT. (Also seen in 2014). Question fibroblastoma. Mild MR.  Carotid Dopplers 12/07/2014: Mild soft plaque in the right CCA, mild mixed plaque in the origin the right ICA. Mild soft plaque in the left CCA and origin of the left ICA. Bilaterally 1-39%  plaquing. Antegrade Vertebral flow. (NORMAL)  Interval History: [name] presents today mostly upset about the course of events in the hospital. He actually is not noticing much the way of any recurrent or residual symptoms from her stroke/TIA. She simply doesn't remember much from the time of her cardiac cath to the time of her diagnosis with stroke. She was insistent that she was not seen on the day after her cardiac catheterization, however there is a note by Melina Copa, PA. This clearly indicates that she was evaluated that day post-cath. The follow-up note on Saturday by Dr. Stanford Breed did not mention anything about a neurologic findings. There is no indication from any of the nurses. It was not until Sunday that symptoms were noted. The husband remembers having a doctor's visit her, but she does not. She insisted that she was not seen in the nothing was discussed about stroke him until several days later. She felt that she was not intention to. She had multiple physicians seen her. This is despite the fact that Drs. Crenshaw and Martinique both spent significant amount time with her.  As for the concern with anticoagulation, she is still very reluctant to consider anticoagulation for fear bleeding. Despite obvious indication that now with stroke her CHADS2VASC2 score is ~7.  She is also very upset about having been converted from Rythmol to amiodarone. She is read up on all the side effects of amiodarone and feels it is a whole medicine. She was very concerned about potential side effects issues. We discussed these in detail as well as the risks and benefits of being on versus off. Otherwise from a cardiac standpoint, she really is not having any active symptoms per se. No further chest tightness or pressure with rest or exertion. No PND, orthopnea or edema.  She always has some mild rare palpitations, but since her TIA/stroke denies any lightheadedness, dizziness, weakness or syncope/near syncope. No  TIA/amaurosis fugax symptoms. No melena, hematochezia, hematuria, or epstaxis. No claudication.  ROS: A comprehensive was performed. Review of Systems  Constitutional: Negative for fever, chills, malaise/fatigue and diaphoresis.  Eyes: Negative for blurred vision and double vision.  Respiratory: Negative for shortness of breath.   Cardiovascular: Negative.        Per history of present illness  Gastrointestinal: Negative for heartburn, constipation, blood in stool and melena.  Genitourinary: Negative for hematuria.  Musculoskeletal: Negative for myalgias.  Neurological: Negative for dizziness, sensory change, speech change, focal weakness, seizures and loss of consciousness.       No residual symptoms from her TIA/stroke  Psychiatric/Behavioral: Positive for memory loss (Does not recall course of events from Friday, January 6 to Sunday January 8). The patient is nervous/anxious.     Past Medical History  Diagnosis Date  . Hypertension   . Dyslipidemia   . Hypothyroidism   . Arthritis     OSTEOARTHRITIS   -- CONSTANT PAIN RIGHT HIP---AND PAIN LEFT KNEE--PT STATES SHE GETS INJECTIONS INTO HER KNEE  . PAF (paroxysmal atrial fibrillation) (HCC)     Only on Plavix -- not full Anticoagulation per pt. request  . Complication of anesthesia     BLOOD PRESSURE DROPPED WITH NASAL SURGERY, ONE OF THE CARPAL TUNNEL REPAIRS AND DURING A COLONOSCOPY  . History of skin cancer   . Ovarian  cancer ([place]) 01/2013    Recurrence since 2014/2016  . NSTEMI (non-ST elevated myocardial infarction) ([place]) 12/09/15    Medical management: Distal branch of D1 95%, ostial D2 75%, distal LAD 50%. Tortuous arteries consistent with hypertension    Past Surgical History  Procedure Laterality Date  . Bilateral carpal tunnel repair  2007  . Dilation and curettage of uterus  1969  . Surgery for ruptured ovarian cyst  1969  . Rhinoplasty for fractured nose  1986  . Left knee arthroscopy   2011  . Total hip  arthroplasty  02/25/2012    Procedure: TOTAL HIP ARTHROPLASTY ANTERIOR APPROACH;  Surgeon: Mcarthur Rossetti, MD;  Location: [facility];  Service: Orthopedics;  Laterality: Right;  . Tonsillectomy  1962  . Laparotomy Bilateral 02/13/2013    Procedure: EXPLORATORY LAPAROTOMY TOTAL ABDOMINAL HYSTERECTOMY BILATERAL SALPINGO-OOPHORECTOMY, Partial Rectal Resection with Reanastamosis;  Surgeon: Alvino Chapel, MD;  Location: [facility];  Service: Gynecology;  Laterality: Bilateral;  . Omentectomy  02/13/2013    Procedure: OMENTECTOMY;  Surgeon: Alvino Chapel, MD;  Location: [facility];  Service: Gynecology;;  . Lymphadenectomy Right 02/13/2013    Procedure: PEVLIC  LYMPHADENECTOMY, DEBULKING right pelvic tumor nodules;  Surgeon: Alvino Chapel, MD;  Location: [facility];  Service: Gynecology;  Laterality: Right;  . Tah/bso/tumor debulking with right pelvic lnd  01/2013  . Transthoracic echocardiogram  May 2014; January 2016    a. Normal LV size function. EF 60-65%. Grade 1 diastolic function. Mild MR and mildly elevated PA pressures of 37 mmHg per;; b. EF 60-65%. Mobile echodensity 10 mm x 6 mm attest interventricular septum is questionable fibroblastoma.  . Nm myoview ltd  June 2010     subbmaximal with no ischemia or infarction.  . Total knee arthroplasty Left 01/03/2015    Procedure: LEFT TOTAL KNEE ARTHROPLASTY;  Surgeon: Mcarthur Rossetti, MD;  Location: [facility];  Service: Orthopedics;  Laterality: Left;  . Cardiac catheterization N/A 12/05/2015    Procedure: Left Heart Cath and Coronary Angiography;  Surgeon: Belva Crome, MD;  Location: [facility];  Service: Cardiovascular;  distal branch of D1 95%, ostial D2 75%, dLAD 50%,p-mRCA 40%. Tortuous vessels.    Prior to Admission medications   Medication Sig Start Date End Date Taking? Authorizing Provider  amiodarone (PACERONE) 400 MG tablet Take 1 tablet (400 mg total) by mouth 2 (two) times daily. 12/09/15  Yes Allie Bossier,  MD  amLODipine (NORVASC) 5 MG tablet Take 1 tablet (5 mg total) by mouth daily. 12/09/15  Yes Allie Bossier, MD  amoxicillin (AMOXIL) 500 MG capsule Reported on 12/10/2015 02/26/15  Yes Historical Provider, MD  aspirin EC 81 MG tablet Take 81 mg by mouth every morning.    Yes Historical Provider, MD  bisacodyl (DULCOLAX) 5 MG EC tablet Take 5 mg by mouth daily as needed for moderate constipation. Reported on 11/20/2015   Yes Historical Provider, MD  clopidogrel (PLAVIX) 75 MG tablet TAKE 1 TABLET BY MOUTH AT BEDTIME 03/14/15  Yes Leonie Man, MD  desonide (DESOWEN) 0.05 % cream Apply 1 application topically 2 (two) times daily.  12/23/14  Yes Historical Provider, MD  diphenhydrAMINE (SOMINEX) 25 MG tablet Take 25-50 mg by mouth at bedtime as needed for sleep.   Yes Historical Provider, MD  docusate sodium (COLACE) 100 MG capsule Take 200 mg by mouth 2 (two) times daily.    Yes Historical Provider, MD  estradiol (ESTRACE) 1 MG tablet Take 1 tablet (  1 mg total) by mouth every morning. PT STATES SHE WANTS ESTRACE--NOT THE GENERIC 03/28/15  Yes Marti Sleigh, MD  Glucosamine HCl (GLUCOSAMINE PO) Take 1,000 mg by mouth every morning.    Yes Historical Provider, MD  irbesartan (AVAPRO) 300 MG tablet Take 1 tablet (300 mg total) by mouth daily. 12/09/15  Yes Allie Bossier, MD  levothyroxine (SYNTHROID) 88 MCG tablet Take 88 mcg by mouth daily before breakfast.   Yes Historical Provider, MD  lidocaine-prilocaine (EMLA) cream Apply to Porta-Cath site 1-2 hours prior to access as directed. 04/22/15  Yes Lennis Marion Downer, MD  loratadine (CLARITIN) 10 MG tablet Take 10 mg by mouth daily as needed for allergies. Reported on 11/20/2015   Yes Historical Provider, MD  magnesium hydroxide (MILK OF MAGNESIA) 800 MG/5ML suspension Take 30 mLs by mouth daily as needed for constipation. Reported on 11/20/2015   Yes Historical Provider, MD  Melatonin 5 MG CAPS Take 1 capsule by mouth at bedtime as needed (sleep).     Yes Historical Provider, MD  Menthol, Topical Analgesic, (ICY HOT EX) Apply 1 application topically daily as needed (pain.).   Yes Historical Provider, MD  metoprolol tartrate (LOPRESSOR) 25 MG tablet Take 1 tablet (25 mg total) by mouth 2 (two) times daily. 12/09/15  Yes Allie Bossier, MD  Multiple Vitamin (MULTIVITAMIN WITH MINERALS) TABS tablet Take 1 tablet by mouth every morning.    Yes Historical Provider, MD  ondansetron (ZOFRAN) 8 MG tablet Take 1 tablet (8 mg total) by mouth every 8 (eight) hours as needed for nausea or vomiting (Will not make drowsy). 07/11/15  Yes Lennis Marion Downer, MD  pantoprazole (PROTONIX) 40 MG tablet Take 1 tablet (40 mg total) by mouth daily. 10/28/15  Yes Leonie Man, MD  Polyethyl Glycol-Propyl Glycol (SYSTANE OP) Place 1 drop into both eyes 2 (two) times daily.    Yes Historical Provider, MD  polyethylene glycol (MIRALAX / GLYCOLAX) packet Take 17 g by mouth every morning.    Yes Historical Provider, MD  potassium chloride (K-DUR) 10 MEQ tablet Take 10 mEq by mouth every Monday, Wednesday, and Friday. 05/05/13  Yes Shon Baton, MD  spironolactone (ALDACTONE) 25 MG tablet Take 25 mg by mouth daily.  11/20/15  Yes Historical Provider, MD   Allergies  Allergen Reactions  . Coreg [Carvedilol] Shortness Of Breath  . Ceftin [Cefuroxime Axetil]     Interferes with propafenone.    . Clonidine Derivatives   . Codeine Other (See Comments)    Does not like the feeling she gets  . Flexeril [Cyclobenzaprine] Other (See Comments)    Pt states "increased heart rate"  . Levaquin [Levofloxacin In D5w]     Interferes with propafenone.   . Lipitor [Atorvastatin] Dermatitis    "feels like bugs are biting her"   . Lisinopril     LIP NUMBNESS  . Tegaderm Ag Mesh [Silver]   . Tape Rash    TEGADERM.   (use opsite on PAC)  . Ultram [Tramadol] Palpitations    Social History   Social History  . Marital Status: Married    Spouse Name: N/A  . Number of Children: N/A  .  Years of Education: N/A   Social History Main Topics  . Smoking status: Never Smoker   . Smokeless tobacco: Never Used  . Alcohol Use: No  . Drug Use: No  . Sexual Activity: Not Currently   Other Topics Concern  . None   Social History Narrative  Married mother of 3, grandmother 2.   Exercises 6/7 days a week walking 30 minutes a time.   Never smoked or drank alcohol.   Family History  Problem Relation Age of Onset  . Hypertension Mother   . Basal cell carcinoma Mother   . AAA (abdominal aortic aneurysm) Father   . Heart Problems Maternal Uncle   . Heart Problems Maternal Grandfather   . AAA (abdominal aortic aneurysm) Paternal Grandmother   . Prostate cancer Maternal Uncle 70  . Prostate cancer Other   . Other Daughter     one daughter had TAH-BSO at 76; other daughter will have one soon    Wt Readings from Last 3 Encounters:  12/16/15 130 lb 1.6 oz (59.013 kg)  12/10/15 132 lb 3.2 oz (59.966 kg)  12/09/15 126 lb 3.2 oz (57.244 kg)    PHYSICAL EXAM BP 150/70 mmHg  Pulse 61  Ht 5\' 3"  (1.6 m)  Wt 130 lb 1.6 oz (59.013 kg)  BMI 23.05 kg/m2 General appearance: alert, cooperative, appears stated age and no distress; wearing wig (loss of hair 2/2 chemo), In good spirits - pleasant afffect HEENT: [name]/AT, EOMI, MMM, anicteric sclera Neck: no adenopathy, no carotid bruit, no JVD and supple, symmetrical, trachea midline Lungs: clear to auscultation bilaterally, normal percussion bilaterally and Nonlabored, good air movement Heart: regular rate and rhythm, S1, S2 normal, no murmur, click, rub or gallop and normal apical impulse Abdomen: soft, non-tender; bowel sounds normal; no masses, no organomegaly Extremities: extremities normal, atraumatic, no cyanosis or edema; no significant bruising. Pulses: 2+ and symmetric Neurologic: Alert and oriented X 3, normal strength and tone. Normal symmetric reflexes. Normal coordination and gait; Cranial nerves: normal (II-XII grossly  intact)   Adult ECG Report  Rate: 61 ;  Rhythm: normal sinus rhythm and 1 AVB. Anterior T-wave abnormality. Cannot exclude ischemia.;   Narrative Interpretation: Otherwise normal axis,intervals & durations.  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  I strongly recommended that we talked about going to full anticoagulation with a DOAC as opposed to aspirin plus Plavix in light of her increased risk.  She told me that she was told by the neurology doctor that she did not need full and regulation which may have been the case right after the stroke, but at this point would not make sense. I have asked her to discuss this with her oncologist and with the stroke doctor, because I am sure that they would agree with my recommendation.  Now we again agreed to stay with aspirin and Plavix, since she at least has coronary disease which are protecting      Relevant Medications   amiodarone (PACERONE) 200 MG tablet   Other Relevant Orders   EKG 12-Lead (Completed)   Pulmonary Function Test (Completed)   PAF (paroxysmal atrial fibrillation) ([place]) - Primary (Chronic)  She really was upset that Rythmol was stopped. Of course now we have diagnosis of coronary disease, Rythmol is contraindicated. I explained this to her in detail. I also explained the use of amiodarone and is  potential side effects. She was concerned about the high dose. I explained to her that this is part of a loading dose process. We will reduce dose gradually from 400 twice a day to 400 per day, then 200 /day, down to 100/  day eventually.  He is also on metoprolol 25 twice a day. -- I suspect that as amiodarone dose decreases we can increase the metoprolol for rate control and blood pressure.  This patients CHA2DS2-VASc Score and unadjusted Ischemic Stroke Rate (% per year) is equal to 11.2 % stroke rate/year from a score of 7  Above score calculated as 1 point each if present [CHF, HTN, DM, Vascular=MI/PAD/Aortic Plaque, Age if 65-74, or Female] Above score calculated as 2 points each if present [Age > 75, or Stroke/TIA/TE]  - As noted above in the stroke section, I strongly again recommended full and thick regulation with DOAC. I've asked her to discuss with Dr. Leonie Man and Dr. Marko Plume (from oncology) --> she may be willing to choose this option if both agree.      Relevant Medications   amiodarone (PACERONE) 200 MG tablet   Other Relevant Orders   EKG 12-Lead (Completed)   Pulmonary Function Test (Completed)   On amiodarone therapy (Chronic)    Weaning amiodarone as indicated. She is symptomatic when in A. fib, and as part of our discussion for anticoagulation versus aspirin/Plavix, we decided if she were to remain on amiodarone in an effort to maintain sinus rhythm, she would therefore be of less risk of recurrent A. fib. This would potentially reduce her stroke risk. - Thin argument, but her choice was still no DOAC  I discussed the potential side effects amiodarone including:   liver toxicity: We will follow LFTs  Thyroid toxicity: Of more concern is hyperthyroidism as opposed to hypothyroidism. She is already on thyroid replacement hormone, making her risk of hyperthyroidism minimal, and hypothyroidism with simply require higher dosing.  Pulmonary toxicity: We would follow pulmonary  function tests on an annual basis basis. This would mean that she needs a baseline PFT now at onset of therapy with which to compare. -- PFTs ordered  Ocular toxicity: We discussed importance of annual dilated eye exam to look for crystals.  Arrhythmias: All antiarrhythmics, amiodarone is the least likely to be proarrhythmic based on its multifaceted coverage      Relevant Orders   EKG 12-Lead (Completed)   Pulmonary Function Test (Completed)   HLD (hyperlipidemia) (Chronic)    Not adequately controlled. Now with having coronary disease this becomes an issue as well. Simply based on her aversion to taking medications, I did not address this issue during this clinic visit. We did review her labs indicating that she is not at goal, but we did not discuss treatment options  Unfortunately, the plans for starting her on a statin while inpatient did not happen. The hope was that they could evaluate her for changes in symptoms while being monitored in the inpatient setting.      Relevant Medications   amiodarone (PACERONE) 200 MG tablet   Essential hypertension (Chronic)    She voice being upset that her blood pressure wasn't being controlled over some fall despite multiple visits with Erasmo Downer [place],(Pharm-D) with medication titration. Several generations of medications were made with no  benefit. Certainly Avastin was playing a role along with her baseline primary hypertension.  For now I like to space out her coverage to allow for more 24-hour coverage splitting the ARB and calcium channel blocker doses with one in the morning and one in the evening.Marland Kitchen  Her blood pressure is actually relatively well-controlled for her today. He has many intolerances to medications making it difficult to manage. For now I'm happy with her blood pressure is at present      Relevant Medications   amiodarone (PACERONE) 200 MG tablet   Other Relevant Orders   EKG 12-Lead (Completed)   Pulmonary Function Test  (Completed)   Dyslipidemia (Chronic)   Relevant Orders   EKG 12-Lead (Completed)   Pulmonary Function Test (Completed)      Current medicines are reviewed at length with the patient today. (+/- concerns) long discussion about amiodarone and not Rythmol, long discussion about anticoagulation, also discussion about statin The following changes have been made:  CONTINUE WITH AMIODARONE 400 MG TWICE A DAY UNTIL END OF THE WEEK , THEN  AMIODARONE 200 MG DAILY UNTIL THE END OF BOTTLE THEN GO TO 100 MG ( 1/2/ TABLET OF THE 200 MG) DAILY .   YOUR CHOICE TO TAKE AMLODIPINE 10 MG IN THE MORNING AND IRBESARTAN 300 MG IN THE EVENING , OR YOU REVERSE THE PREVIOUS  Your physician has recommended that you have a pulmonary function test towards the end of Jan 2017 at [facility]  outpatient.    Your physician wants you to follow-up in [facility] 2017 WITH DR HARDING - 30 MIN   Studies Ordered:   Orders Placed This Encounter  Procedures  . EKG 12-Lead  . Pulmonary Function Test    We discussed multiple medical issues and treatment choices. I have made medication adjustments for her blood pressure. In addition to the routine 25 minute visit, I spent well over 45 minutes (more than 50% of which) was in consultation explaining and discussing her hospital course, treatment options and decision made during the hospital stay as well as going forward. A good portion was dedicated to her about being allowed to voice her complaints.   Leonie Man, M.D., M.S. Interventional Cardiologist   Pager # [phone]

## 2015-12-22 ENCOUNTER — Other Ambulatory Visit: Payer: Self-pay | Admitting: Gynecologic Oncology

## 2015-12-22 DIAGNOSIS — C562 Malignant neoplasm of left ovary: Principal | ICD-10-CM

## 2015-12-22 DIAGNOSIS — N952 Postmenopausal atrophic vaginitis: Secondary | ICD-10-CM

## 2015-12-22 DIAGNOSIS — C561 Malignant neoplasm of right ovary: Secondary | ICD-10-CM

## 2015-12-22 DIAGNOSIS — N951 Menopausal and female climacteric states: Secondary | ICD-10-CM

## 2015-12-22 DIAGNOSIS — C563 Malignant neoplasm of bilateral ovaries: Secondary | ICD-10-CM

## 2015-12-22 MED ORDER — ESTRADIOL 1 MG PO TABS: 1.0000 mg | ORAL_TABLET | ORAL | Status: DC

## 2015-12-23 ENCOUNTER — Ambulatory Visit (HOSPITAL_COMMUNITY)
Admission: RE | Admit: 2015-12-23 | Discharge: 2015-12-23 | Disposition: A | Discharge status: Home / Self Care | Payer: Medicare Other | Source: Ambulatory Visit | Attending: Cardiology | Admitting: Cardiology

## 2015-12-23 ENCOUNTER — Encounter: Payer: Self-pay | Admitting: Cardiology

## 2015-12-23 ENCOUNTER — Telehealth: Payer: Self-pay | Admitting: Cardiology

## 2015-12-23 DIAGNOSIS — Z79899 Other long term (current) drug therapy: Secondary | ICD-10-CM | POA: Insufficient documentation

## 2015-12-23 DIAGNOSIS — E785 Hyperlipidemia, unspecified: Secondary | ICD-10-CM | POA: Insufficient documentation

## 2015-12-23 DIAGNOSIS — I10 Essential (primary) hypertension: Secondary | ICD-10-CM | POA: Insufficient documentation

## 2015-12-23 DIAGNOSIS — I639 Cerebral infarction, unspecified: Secondary | ICD-10-CM | POA: Diagnosis not present

## 2015-12-23 DIAGNOSIS — I480 Paroxysmal atrial fibrillation: Secondary | ICD-10-CM | POA: Diagnosis not present

## 2015-12-23 LAB — PULMONARY FUNCTION TEST
DL/VA % PRED: 68 %
DL/VA: 3.2 ml/min/mmHg/L
DLCO COR % PRED: 58 %
DLCO COR: 13.32 ml/min/mmHg
DLCO unc % pred: 58 %
DLCO unc: 13.36 ml/min/mmHg
FEF 25-75 POST: 2.5 L/s
FEF 25-75 Pre: 2.09 L/sec
FEF2575-%CHANGE-POST: 19 %
FEF2575-%PRED-POST: 144 %
FEF2575-%PRED-PRE: 120 %
FEV1-%Change-Post: 4 %
FEV1-%Pred-Post: 103 %
FEV1-%Pred-Pre: 99 %
FEV1-Post: 2.17 L
FEV1-Pre: 2.09 L
FEV1FVC-%CHANGE-POST: 2 %
FEV1FVC-%PRED-PRE: 111 %
FEV6-%CHANGE-POST: 1 %
FEV6-%Pred-Post: 95 %
FEV6-%Pred-Pre: 93 %
FEV6-Post: 2.53 L
FEV6-Pre: 2.5 L
FEV6FVC-%PRED-PRE: 105 %
FEV6FVC-%Pred-Post: 105 %
FVC-%Change-Post: 1 %
FVC-%PRED-POST: 90 %
FVC-%Pred-Pre: 89 %
FVC-PRE: 2.5 L
FVC-Post: 2.53 L
POST FEV1/FVC RATIO: 86 %
Post FEV6/FVC ratio: 100 %
Pre FEV1/FVC ratio: 83 %
Pre FEV6/FVC Ratio: 100 %
RV % pred: 87 %
RV: 1.92 L
TLC % pred: 95 %
TLC: 4.69 L

## 2015-12-23 MED ORDER — ALBUTEROL SULFATE (2.5 MG/3ML) 0.083% IN NEBU
2.5000 mg | INHALATION_SOLUTION | Freq: Once | RESPIRATORY_TRACT | Status: AC
  Administered 2015-12-23: 2.5 mg via RESPIRATORY_TRACT

## 2015-12-23 NOTE — Telephone Encounter (Signed)
Nausea/fatigue most likely related to amiodarone, especially at higher doses.  Agree with scaling back dose and taking in am.  Have her give this dose decrease some time.  Irbesartan not likely to be cause.

## 2015-12-23 NOTE — Assessment & Plan Note (Signed)
Thought to be related to cardioembolic event. 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  I strongly recommended that we talked about going to full anticoagulation with a DOAC as opposed to aspirin plus Plavix in light of her increased risk.  She told me that she was told by the neurology doctor that she did not need full and regulation which may have been the case right after the stroke, but at this point would not make sense. I have asked her to discuss this with her oncologist and with the stroke doctor, because I am sure that they would agree with my recommendation.  Now we again agreed to stay with aspirin and Plavix, since she at least has coronary disease which are protecting

## 2015-12-23 NOTE — Telephone Encounter (Signed)
Pt has been having some nausea, fatigue - mainly in AM. She thinks this is due to her irbesartan which was prescribed in hospital <1 month ago, since she takes this at night.  Pt reports BPs, HRs - 111/67 w HR 63 today, 143/67 HR 61 yesterday.  She is taking amlodipine 10mg  in AM Irbesartan 300mg  in PM   We discussed her amiodarone dosing. Discovered she had not scaled down to 200mg  daily yet, she has actually been taking 200mg  BID.  Advised to cut this dose (do not take amiodarone this evening), take daily only until end of bottle. Start on the 0.5 tab daily (100mg ) at next refill. Pt voiced understanding.  She is aware I am deferring to [facility] for further advice. Pt does anticipate a call back from our office with recommendations.

## 2015-12-23 NOTE — Assessment & Plan Note (Signed)
Not adequately controlled. Now with having coronary disease this becomes an issue as well. Simply based on her aversion to taking medications, I did not address this issue during this clinic visit. We did review her labs indicating that she is not at goal, but we did not discuss treatment options  Unfortunately, the plans for starting her on a statin while inpatient did not happen. The hope was that they could evaluate her for changes in symptoms while being monitored in the inpatient setting.

## 2015-12-23 NOTE — Assessment & Plan Note (Addendum)
She really was upset that Rythmol was stopped. Of course now we have diagnosis of coronary disease, Rythmol is contraindicated. I explained this to her in detail. I also explained the use of amiodarone and is potential side effects. She was concerned about the high dose. I explained to her that this is part of a loading dose process. We will reduce dose gradually from 400 twice a day to 400 per day, then 200 /day, down to 100/  day eventually.  He is also on metoprolol 25 twice a day. -- I suspect that as amiodarone dose decreases we can increase the metoprolol for rate control and blood pressure.  This patients CHA2DS2-VASc Score and unadjusted Ischemic Stroke Rate (% per year) is equal to 11.2 % stroke rate/year from a score of 7  Above score calculated as 1 point each if present [CHF, HTN, DM, Vascular=MI/PAD/Aortic Plaque, Age if 65-74, or Female] Above score calculated as 2 points each if present [Age > 75, or Stroke/TIA/TE]  - As noted above in the stroke section, I strongly again recommended full and thick regulation with DOAC. I've asked her to discuss with Dr. Leonie Man and Dr. Marko Plume (from oncology) --> she may be willing to choose this option if both agree.

## 2015-12-23 NOTE — Telephone Encounter (Signed)
Recommendations relayed to patient, who verbalized understanding.

## 2015-12-23 NOTE — Assessment & Plan Note (Signed)
Weaning amiodarone as indicated. She is symptomatic when in A. fib, and as part of our discussion for anticoagulation versus aspirin/Plavix, we decided if she were to remain on amiodarone in an effort to maintain sinus rhythm, she would therefore be of less risk of recurrent A. fib. This would potentially reduce her stroke risk. - Thin argument, but her choice was still no DOAC  I discussed the potential side effects amiodarone including:   liver toxicity: We will follow LFTs  Thyroid toxicity: Of more concern is hyperthyroidism as opposed to hypothyroidism. She is already on thyroid replacement hormone, making her risk of hyperthyroidism minimal, and hypothyroidism with simply require higher dosing.  Pulmonary toxicity: We would follow pulmonary function tests on an annual basis basis. This would mean that she needs a baseline PFT now at onset of therapy with which to compare. -- PFTs ordered  Ocular toxicity: We discussed importance of annual dilated eye exam to look for crystals.  Arrhythmias: All antiarrhythmics, amiodarone is the least likely to be proarrhythmic based on its multifaceted coverage

## 2015-12-23 NOTE — Telephone Encounter (Signed)
IShe says she figured out the

## 2015-12-23 NOTE — Telephone Encounter (Signed)
She said one of the medicine is making her sick,she wants Dr Ellyn Hack to change it please.Altus she said,I did not see it on her medicine list.

## 2015-12-23 NOTE — Assessment & Plan Note (Signed)
She voice being upset that her blood pressure wasn't being controlled over some fall despite multiple visits with Erasmo Downer Alvstad,(Pharm-D) with medication titration. Several generations of medications were made with no benefit. Certainly Avastin was playing a role along with her baseline primary hypertension.  For now I like to space out her coverage to allow for more 24-hour coverage splitting the ARB and calcium channel blocker doses with one in the morning and one in the evening.Marland Kitchen  Her blood pressure is actually relatively well-controlled for her today. He has many intolerances to medications making it difficult to manage. For now I'm happy with her blood pressure is at present

## 2015-12-24 ENCOUNTER — Other Ambulatory Visit: Payer: Self-pay | Admitting: Oncology

## 2015-12-24 DIAGNOSIS — C563 Malignant neoplasm of bilateral ovaries: Secondary | ICD-10-CM

## 2015-12-24 DIAGNOSIS — C569 Malignant neoplasm of unspecified ovary: Secondary | ICD-10-CM

## 2015-12-24 DIAGNOSIS — C562 Malignant neoplasm of left ovary: Principal | ICD-10-CM

## 2015-12-24 DIAGNOSIS — C561 Malignant neoplasm of right ovary: Secondary | ICD-10-CM

## 2015-12-25 ENCOUNTER — Telehealth: Payer: Self-pay | Admitting: Oncology

## 2015-12-25 ENCOUNTER — Other Ambulatory Visit (HOSPITAL_BASED_OUTPATIENT_CLINIC_OR_DEPARTMENT_OTHER): Payer: 59

## 2015-12-25 ENCOUNTER — Ambulatory Visit (HOSPITAL_BASED_OUTPATIENT_CLINIC_OR_DEPARTMENT_OTHER): Payer: 59 | Admitting: Oncology

## 2015-12-25 ENCOUNTER — Encounter: Payer: Self-pay | Admitting: Oncology

## 2015-12-25 ENCOUNTER — Encounter (HOSPITAL_COMMUNITY): Payer: Medicare Other

## 2015-12-25 VITALS — BP 126/62 | HR 59 | Temp 97.6°F | Resp 18 | Ht 63.0 in | Wt 132.8 lb

## 2015-12-25 DIAGNOSIS — T50905A Adverse effect of unspecified drugs, medicaments and biological substances, initial encounter: Secondary | ICD-10-CM

## 2015-12-25 DIAGNOSIS — C562 Malignant neoplasm of left ovary: Secondary | ICD-10-CM | POA: Diagnosis not present

## 2015-12-25 DIAGNOSIS — C561 Malignant neoplasm of right ovary: Secondary | ICD-10-CM

## 2015-12-25 DIAGNOSIS — Z95828 Presence of other vascular implants and grafts: Secondary | ICD-10-CM

## 2015-12-25 DIAGNOSIS — C7889 Secondary malignant neoplasm of other digestive organs: Secondary | ICD-10-CM

## 2015-12-25 DIAGNOSIS — C569 Malignant neoplasm of unspecified ovary: Secondary | ICD-10-CM

## 2015-12-25 DIAGNOSIS — I214 Non-ST elevation (NSTEMI) myocardial infarction: Secondary | ICD-10-CM

## 2015-12-25 DIAGNOSIS — Z79899 Other long term (current) drug therapy: Secondary | ICD-10-CM

## 2015-12-25 DIAGNOSIS — I10 Essential (primary) hypertension: Secondary | ICD-10-CM

## 2015-12-25 DIAGNOSIS — C5702 Malignant neoplasm of left fallopian tube: Secondary | ICD-10-CM

## 2015-12-25 DIAGNOSIS — G620 Drug-induced polyneuropathy: Secondary | ICD-10-CM

## 2015-12-25 DIAGNOSIS — I158 Other secondary hypertension: Secondary | ICD-10-CM

## 2015-12-25 DIAGNOSIS — I480 Paroxysmal atrial fibrillation: Secondary | ICD-10-CM

## 2015-12-25 DIAGNOSIS — I639 Cerebral infarction, unspecified: Secondary | ICD-10-CM

## 2015-12-25 DIAGNOSIS — K219 Gastro-esophageal reflux disease without esophagitis: Secondary | ICD-10-CM

## 2015-12-25 DIAGNOSIS — D696 Thrombocytopenia, unspecified: Secondary | ICD-10-CM

## 2015-12-25 DIAGNOSIS — C5701 Malignant neoplasm of right fallopian tube: Secondary | ICD-10-CM | POA: Diagnosis not present

## 2015-12-25 DIAGNOSIS — E039 Hypothyroidism, unspecified: Secondary | ICD-10-CM

## 2015-12-25 DIAGNOSIS — K5900 Constipation, unspecified: Secondary | ICD-10-CM

## 2015-12-25 LAB — COMPREHENSIVE METABOLIC PANEL
ALT: 14 U/L (ref 0–55)
ANION GAP: 8 meq/L (ref 3–11)
AST: 16 U/L (ref 5–34)
Albumin: 3.9 g/dL (ref 3.5–5.0)
Alkaline Phosphatase: 71 U/L (ref 40–150)
BUN: 15.6 mg/dL (ref 7.0–26.0)
CALCIUM: 9.3 mg/dL (ref 8.4–10.4)
CHLORIDE: 97 meq/L — AB (ref 98–109)
CO2: 27 meq/L (ref 22–29)
CREATININE: 0.8 mg/dL (ref 0.6–1.1)
EGFR: 75 mL/min/{1.73_m2} — ABNORMAL LOW (ref >90)
Glucose: 120 mg/dl (ref 70–140)
POTASSIUM: 4.3 meq/L (ref 3.5–5.1)
Sodium: 132 mEq/L — ABNORMAL LOW (ref 136–145)
Total Bilirubin: 0.65 mg/dL (ref 0.20–1.20)
Total Protein: 6.7 g/dL (ref 6.4–8.3)

## 2015-12-25 LAB — CBC WITH DIFFERENTIAL/PLATELET
BASO%: 1.1 % (ref 0.0–2.0)
BASOS ABS: 0 10*3/uL (ref 0.0–0.1)
EOS%: 1 % (ref 0.0–7.0)
Eosinophils Absolute: 0 10*3/uL (ref 0.0–0.5)
HEMATOCRIT: 37.4 % (ref 34.8–46.6)
HGB: 12.6 g/dL (ref 11.6–15.9)
LYMPH%: 34.2 % (ref 14.0–49.7)
MCH: 34.2 pg — AB (ref 25.1–34.0)
MCHC: 33.6 g/dL (ref 31.5–36.0)
MCV: 101.6 fL — ABNORMAL HIGH (ref 79.5–101.0)
MONO#: 0.4 10*3/uL (ref 0.1–0.9)
MONO%: 10.3 % (ref 0.0–14.0)
NEUT#: 2.2 10*3/uL (ref 1.5–6.5)
NEUT%: 53.4 % (ref 38.4–76.8)
PLATELETS: 130 10*3/uL — AB (ref 145–400)
RBC: 3.68 10*6/uL — AB (ref 3.70–5.45)
RDW: 13.8 % (ref 11.2–14.5)
WBC: 4.2 10*3/uL (ref 3.9–10.3)
lymph#: 1.4 10*3/uL (ref 0.9–3.3)

## 2015-12-25 NOTE — Telephone Encounter (Signed)
Appointments made and avs prnted for patient °

## 2015-12-25 NOTE — Progress Notes (Signed)
OFFICE PROGRESS NOTE   December 25, 2015   Physicians: D. 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 She saw Dr Josephina Shih on 11-07-15, then had restaging PET on 11-18-15 with hypermetabolic uptake in splenic hilum and a 1 cm right pelvic sidewall node. Dr Lisbeth Renshaw saw her in consultation and would consider radiation if unable to receive systemic chemotherapy  Unfortunately patient was admitted 1-4 thru 12-09-15 with NSTEMI, presenting with chest pain and BP 210/100. She had cardiac cath on 12-05-15, with findings of 1st Diag lesion, 95% stenosed, Ost 2nd Diag lesion, 75% stenosed, Dist LAD lesion, 50% stenosed,  Prox RCA to Mid RCA lesion, 40% stenosed.  Following the cardiac catheterization she had vision changes including loss of left peripheral vision. MRI brain 12-07-15 revealed several small areas of acute infarct in the right posterior temporoparietal lobe. She was seen by neurology, follow up with Dr Leonie Man scheduled 01-26-16. The acute CVA was thought related either to disruption of plaque with the catheterization or related to PAF. Patient is presently on Plavix and ASA. As extensively documented in EMR, neurology and cardiology have carefully considered anticoagulation, given CHADS VASC score 6, with notes indicating anticoagulation preferred. Patient and husband discussed this also with Dr Ellyn Hack at his lengthy visit on 12-23-15.   She has also seen Dr Virgina Jock since the hospitalization   Patient had eye exam this week, all vision changes have resolved. Blood pressure is progressively improving as she gets further out from avastin, as is generally the case 2-3+ months off of that therapy. She needs to check blood pressure at home, as she may need  medications adjusted down from here. Appetite is reasonable, chronic constipation unchanged with last bowel movement ~ 2 days ago. No abdominal or pelvic pain. No LE swelling. No bleeding. No fever now (was febrile in hospital for unclear reasons, possibly medication related, note similar problems after chemo on several occasions). No SOB or chest pain. No HA. No new or different neurologic symptoms. No bleeding. 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 PAC used in hospital, flushed 12-09-15 Remainder of 10 point Review of Systems negative.   Flu vaccine 09-12-15 PAC flushed 11-06-15 Genetics testing BreastOvarian panel by [facility] sent 06-26-15: normal CA 125 on 05-01-15 33  ONCOLOGIC HISTORY  Patient presented in late Jan 2014 with 2 weeks of spotting in early Dec 2015. Sonohystogram 01-05-13 showed uterus normal size and echotexture, endometrium 4.3 mm, left ovary normal and right adnexa with 1.1 x 8.4.x8.2 cm cystic and solid mass. Endometrial biopsy benign and CA 125 also on 01-05-13 was 178.8. CT AP 01-17-13 with 1.0 x 6.9 x 8.9 cm complex right ovarian mass, no ascites, small retroperitoneal nodes. She was seen by Dr Skeet Latch on 01-18-13 and taken to surgery by Dr Josephina Shih on 02-13-13, which was TAH/BSO/ omentectomy/ureterolysis/ resection of cul-de-sac tumor/right pelvic lymphadnectomy and resection of rectum with reanastomosis. At completion of surgery there was no gross residual disease. 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 Washings [phone]) had rare clusters of atypical cells. Chemotherapy with dose dense taxol carboplatin was begun day 1 cycle 1 on 03-20-13; ANC was  1.1 on day 15 cycle 1 with taxol given and neupogen added 04-04-13.  She had day 1 cycle 2 treatment on 04-17-13, then was briefly hospitalized 5-22 to 04-20-13 after syncopal episode, with antihypertensive agents DCd and UTI treated. She was feeling much better at time of "day 8" cycle 2 on 05-01-13 and did have neupogen 300 mcg x 1 dose on 05-02-13. She was readmitted to hospital 6-5 thru 05-05-13 with fever, empirically on antibiotics and blood cultures negative. Cycle 6 completed 08-14-13. CT AP 09-25-13 had no evidence of cancer and CA 125 was 18.3 on 07-24-2013. Marker was 20 in early Dec 2015, 26 on 01-31-15 and 35 on 03-14-15. PET 04-03-15 documented disease bilateral pelvic sidewalls, vaginal cuff and scattered areas thru abdomen/ pelvis, as well as area of uptake in spleen. She resumed carboplatin taxol using q 3 week regimen on 05-01-15. She was neutropenic with ANC 0.3 on day 9 despite beginning granix on day 5. She had 6 cycles of carbo taxol from 05-01-15 thru 08-25-15, with avastin cycles 2,3,4, 6. CT AP 09-10-15 showed resolution of involvement other than in spleen, which was improved. She saw Dr Josephina Shih on 09-12-15 , with discussion of splenectomy followed by avastin vs 3 additional cycles of chemo + avastin then reassess. Patient preferred additional treatment to splenectomy, with carbo taxol avastin resumed 09-29-15 and treated again 10-21-15. She tolerated treatment progressively more poorly, such that 1 planned additional cycle was held. CT CAP 11-06-15 and PET 11-18-15 showed persistent disease in area of splenic hilum (between stomach and spleen) and at 1 cm right pelvic sidewall node.  She had NSTEMI with marked HTN early Jan 2017, then CVA.   Objective:  Vital signs in last 24 hours:  BP 126/62 mmHg  Pulse 59  Temp(Src) 97.6 F (36.4 C) (Oral)  Resp 18  Ht 5' 3"  (1.6 m)  Wt 132 lb 12.8 oz (60.238 kg)  BMI 23.53 kg/m2  SpO2 98% Weight down 2 lbs Alert, oriented and appropriate. Ambulatory without assistance. Alopecia  HEENT:PERRL, sclerae not icteric.  Oral mucosa moist without lesions, posterior pharynx clear.  Neck supple. No JVD.  Lymphatics:no cervical,supraclavicular or inguinal adenopathy Resp: clear to auscultation bilaterally and normal percussion bilaterally Cardio: regular rate and rhythm. No gallop. GI: soft, nontender, mildly distended unchanged, no organomegaly. Minimal bowel sounds. Surgical incision not remarkable. Musculoskeletal/ Extremities: without pitting edema, cords, tenderness Neuro: no change peripheral neuropathy. Otherwise nonfocal Skin without rash, ecchymosis, petechiae Portacath-without erythema or tenderness  Lab Results:  Results for orders placed or performed in visit on 12/25/15  CBC with Differential  Result Value Ref Range   WBC 4.2 3.9 - 10.3 10e3/uL   NEUT# 2.2 1.5 - 6.5 10e3/uL   HGB 12.6 11.6 - 15.9 g/dL   HCT 37.4 34.8 - 46.6 %   Platelets 130 (L) 145 - 400 10e3/uL   MCV 101.6 (H) 79.5 - 101.0 fL   MCH 34.2 (H) 25.1 - 34.0 pg   MCHC 33.6 31.5 - 36.0 g/dL   RBC 3.68 (L) 3.70 - 5.45 10e6/uL   RDW 13.8 11.2 - 14.5 %   lymph# 1.4 0.9 - 3.3 10e3/uL   MONO# 0.4 0.1 - 0.9 10e3/uL   Eosinophils Absolute 0.0 0.0 - 0.5 10e3/uL   Basophils Absolute 0.0 0.0 - 0.1 10e3/uL   NEUT% 53.4 38.4 - 76.8 %   LYMPH% 34.2 14.0 - 49.7 %   MONO% 10.3 0.0 - 14.0 %   EOS% 1.0 0.0 - 7.0 %  BASO% 1.1 0.0 - 2.0 %  Comprehensive metabolic panel  Result Value Ref Range   Sodium 132 (L) 136 - 145 mEq/L   Potassium 4.3 3.5 - 5.1 mEq/L   Chloride 97 (L) 98 - 109 mEq/L   CO2 27 22 - 29 mEq/L   Glucose 120 70 - 140 mg/dl   BUN 15.6 7.0 - 26.0 mg/dL   Creatinine 0.8 0.6 - 1.1 mg/dL   Total Bilirubin 0.65 0.20 - 1.20 mg/dL   Alkaline Phosphatase 71 40 - 150 U/L   AST 16 5 - 34 U/L   ALT 14 0 - 55 U/L   Total Protein 6.7 6.4 - 8.3 g/dL   Albumin 3.9 3.5 - 5.0 g/dL   Calcium 9.3 8.4 - 10.4 mg/dL   Anion Gap 8 3 - 11 mEq/L   EGFR 75 (L) >90 ml/min/1.73 m2    CA 125 available after visit 13 by previous lab method  and 11.5 by new lab method for comparison  Studies/Results: CHEST 2 VIEW  12-06-15  COMPARISON: 12/04/2015  FINDINGS: There is a right chest wall port a catheter with tip in the projection of the SVC. The heart size appears normal. There is no pleural effusion or edema could no airspace consolidation is identified. Spondylosis is present within the thoracic spine.  IMPRESSION: 1. No acute cardiopulmonary abnormalities. 2. Thoracic spondylosis noted.   PATHOLOGY I have spoken directly with [facility] pathologist re ER PR testing that was previously requested on pathology specimen [id number] from 02-13-13, those results not yet in EMR but showed ER weakly + in 25-30% and PR moderately + in 15-20% NOTE she has been on supplemental estrogen x >20 years, including at time of this surgical path.   Medications: I have reviewed the patient's current medications.  DISCUSSION I have told patient that priority at present is the cardiovascular and stroke complications. The areas of cancer do not require chemotherapy in near future and anticoagulation would not be contraindicated from this standpoint. I have recommended that we observe closely over next 6-8 weeks, hopefully to allow these other problems to improve and management to be stabilized. We discussed option of radiation to the limited areas of involvement, which Dr Josephina Shih had thought very reasonable also.  I have talked with them about the + ER PR testing, explaining that estrogens may stimulate growth of cancer cells in ER + tumors. Patient is still very adamant that she does not want to stop the estradiol, as in the past she has had recurrent hot flashes and irritability when she has tried to be off of this briefly. I have been clear that a hormonal intervention would probably be much easier from standpoint of side effects than chemotherapy.      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  Adjuvant dose dense carboplatin taxol completed 08-14-2013. Progressive disease spring 2016, asymptomatic. Resumed chemotherapy with carboplatin and taxol on 05-01-15, addition of avastin with cycle 2. Restaging CT 08-2015 after 6 cycles improved, with residual in spleen. Additional 2 cycles carbo taxol avastin given 09-29-15 and 10-21-15, tolerated poorly including hypertension. CT CAP and PET 10-2015 residual disease in region of splenic hilum and 1 right pelvic node. GIven recent complications of NSTEMI and CVA, with very poor tolerance of chemotherapy and contraindication for additional avastin, we will hold treatment at least until I see her back in ~ 6 weeks. May still be reasonable to consider directed  radiation. 2.NSTEMI with marked HTN on 12-03-15, then acute CVA ~ 12-07-15 either from cath or PAF. Dr Ellyn Hack recommends additional anticoagulation, which would be OK from standpoint of the recurrent ovarian cancer. Follow up with Dr Leonie Man in Feb.  3.HTN: exacerbated by avastin, now seems to be improving 2 months out from last dose. BP may continue to improve over next couple of months, and she may require less antihypertensives over time. 4.paroxysmal Afib: followed by cardiology, on ASA and plavix. See consideration of anticoagulation also 5.minimal thrombocytopenia possibly medication related, would not be from last chemo now. Still in reasonable range to anticoagulate if necessary. 6.chronic constipation, required increased laxatives with chemo, up to date on colonoscopy.  7..hypothyroid on replacement by PCP. Diffuse uptake left lobe thyroid by scans, per radiologist consistent with thyroiditis. I will send scan reports to Dr Virgina Jock. 8. Long and ongoing estrogen replacement: Testing done on surgical path from 02-13-13 shows some positivity for ER PR (tho was on same estrogen at that time). DC of supplemental estrogen would be  appropriate if patient agrees. Could add estrogen blocker after off estrogen.  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 Breast tissue heterogeneously dense by last tomo mammograms at [facility] 03-21-15, those mammograms otherwise negative.  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 11. GERD: better with protonix, continue. 12. flu vaccine 09-12-15 13.PAC flushed 12-09-15, will flush with labs coordinating with my next appointment 14 Chronic balance problems, which she mentions again now. We had previously discussed referral to outpatient balance PT  15.Left knee replacement 12-2014, doing well. Right hip replacement 2013 for osteoarthritis   All questions answered and they know to call if needed prior to next scheduled appointment.  TIme spent 40 min including >50% counseling and coordination of care. CC Drs Fermin Schwab, Vicenta Dunning, MD   12/25/2015, 4:57 PM

## 2015-12-26 ENCOUNTER — Telehealth: Payer: Self-pay | Admitting: Cardiology

## 2015-12-26 LAB — CA 125: Cancer Antigen (CA) 125: 11.5 U/mL (ref 0.0–38.1)

## 2015-12-26 LAB — CANCER ANTIGEN 125 (PARALLEL TESTING): CA 125: 13 U/mL (ref <35)

## 2015-12-26 NOTE — Telephone Encounter (Signed)
Received records from Marica Otter, [place], for appointment with Dr Ellyn Hack on 03/01/16.  Records given to [facility] (medical records) for Dr Allison Quarry schedule on 03/01/16. lp

## 2015-12-26 NOTE — Telephone Encounter (Signed)
We finally have BP controlled.  Lets leave things as is.  [name]

## 2015-12-26 NOTE — Telephone Encounter (Signed)
New message    Patient calling    Pt c/o medication issue:  1. Name of Medication: spironolactone 25 mg   2. How are you currently taking this medication (dosage and times per day)? Once a day   3. Are you having a reaction (difficulty breathing--STAT)?  Blood pressure is going down   4. What is your medication issue?  Wants to discuss with nurse

## 2015-12-26 NOTE — Telephone Encounter (Signed)
Pt saw Dr. Marko Plume who had wondered about necessity of pt remaining on all meds, particularly spironolactone.  Pt explains she last had chemo in Nov, had noted higher BPs at that time.  She wondered if her BP was running too low recently. She reports values in [id number] systolic. I inquired about any other symptoms, pt denies. Advised BPs OK. Probably no need to change any meds. Would not change but would defer to Dr. Ellyn Hack to see if anything advised.  Pt instructed to call if new concerns.

## 2015-12-26 NOTE — Telephone Encounter (Signed)
Dr. Allison Quarry recommendations acknowledged.

## 2015-12-29 ENCOUNTER — Encounter: Payer: Self-pay | Admitting: Cardiology

## 2015-12-29 ENCOUNTER — Telehealth: Payer: Self-pay | Admitting: Gynecologic Oncology

## 2015-12-29 DIAGNOSIS — N952 Postmenopausal atrophic vaginitis: Secondary | ICD-10-CM | POA: Insufficient documentation

## 2015-12-30 ENCOUNTER — Telehealth: Payer: Self-pay | Admitting: Gynecologic Oncology

## 2015-12-30 ENCOUNTER — Telehealth: Payer: Self-pay | Admitting: *Deleted

## 2015-12-30 ENCOUNTER — Encounter: Payer: Self-pay | Admitting: Gynecologic Oncology

## 2015-12-30 NOTE — Telephone Encounter (Signed)
Spoke to patient.  PFT  Result given . Verbalized understanding ROUTED TO DR RUSSO

## 2015-12-30 NOTE — Telephone Encounter (Signed)
Informed patient that a fax was received from [employer] stating the medication was previously approved and that she should be able to fill a prescription at her pharmacy.  [facility] to [facility].  Patient advised to call for any questions or concerns.

## 2015-12-30 NOTE — Telephone Encounter (Signed)
Duplicate

## 2015-12-30 NOTE — Telephone Encounter (Signed)
Called and spoke with [employer] representative for 20 minutes about creating a prior auth for the patient's estrace tablets.  Insurance will only cover the generic.  Patient states she can only take the brand name.  Prior auth submitted.  Awaiting return fax from [facility].

## 2015-12-30 NOTE — Telephone Encounter (Addendum)
-----   Message from Leonie Man, MD sent at 12/23/2015 12:39 PM EST ----- PFTs complete- waiting MD Read.  HARDING, DAVID W, MD The FVC, FEV1, FEV1/FVC ratio and FEF25-75% are within normal limits. Lung volumes are within  normal limits. Following administration of bronchodilators, there is no significant response. 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   This is a baseline reading -- will need PCP to see these results b/c of diffusion defect.   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   Leonie Man, MD

## 2015-12-31 ENCOUNTER — Telehealth: Payer: Self-pay | Admitting: Cardiology

## 2015-12-31 NOTE — Telephone Encounter (Signed)
Had thorough discussion regarding SEs, warnings w/ eliquis & NOACs generally, pt voiced acknowledgment.  She is OK to start this med, but wants clarification on what to do w/ her ASA 81mg  daily & clopidogrel 75mg  daily. Informed her I would seek advice & let her know.

## 2015-12-31 NOTE — Telephone Encounter (Signed)
Pt wanted Dr Ellyn Hack to know that she is now ready to take a blood thinner.[phone].

## 2015-12-31 NOTE — Telephone Encounter (Signed)
D/c Plavix & ASA  [facility]

## 2015-12-31 NOTE — Telephone Encounter (Signed)
Okay. I think the best option would be to try ELIQUIS 5 mg twice a day.  Leonie Man, MD

## 2016-01-01 ENCOUNTER — Telehealth: Payer: Self-pay | Admitting: Cardiology

## 2016-01-01 MED ORDER — IRBESARTAN 300 MG PO TABS: 300.0000 mg | ORAL_TABLET | Freq: Every day | ORAL | Status: DC

## 2016-01-01 MED ORDER — APIXABAN 5 MG PO TABS: 5.0000 mg | ORAL_TABLET | Freq: Two times a day (BID) | ORAL | Status: DC

## 2016-01-01 NOTE — Telephone Encounter (Signed)
°*  STAT* If patient is at the pharmacy, call can be transferred to refill team.   1. Which medications need to be refilled? (please list name of each medication and dose if known) Irbesartan-pharmacist said they had try to contact the office 3 times,they told her to call 2. Which pharmacy/location (including street and city if local pharmacy) is [facility] to be sent to?[phone]  3. Do they need a 30 day or 90 day supply? 90 and refills

## 2016-01-01 NOTE — Telephone Encounter (Signed)
Refill sent, pt informed

## 2016-01-01 NOTE — Telephone Encounter (Signed)
Acknowledged. communicatd w/ patient who voiced understanding.

## 2016-01-02 ENCOUNTER — Telehealth: Payer: Self-pay | Admitting: *Deleted

## 2016-01-02 NOTE — Telephone Encounter (Signed)
contact prior authorization line- spoke to [place] 5 MG TWICE A DAY  DX ICD -10 CODE---- I 48.0   NOTIFIED [facility] -

## 2016-01-05 ENCOUNTER — Other Ambulatory Visit: Payer: Self-pay | Admitting: Cardiology

## 2016-01-06 NOTE — Telephone Encounter (Signed)
Rx request sent to pharmacy.  

## 2016-01-21 ENCOUNTER — Telehealth: Payer: Self-pay | Admitting: Cardiology

## 2016-01-21 NOTE — Telephone Encounter (Signed)
Pt called in stating that she was prescribed Irbesartan while in the hospital and since then she has noticed that her urine output has decreased more than normal. She would like to speak with someone about this.  Thanks

## 2016-01-21 NOTE — Telephone Encounter (Signed)
Forward to [place] - pharm-d Will defer to [employer]

## 2016-01-22 NOTE — Telephone Encounter (Signed)
Follow up     Patient is calling to cancel previous message.  She is going to see her PCP today.  You do not have to return her call

## 2016-01-26 ENCOUNTER — Encounter: Payer: Self-pay | Admitting: Neurology

## 2016-01-26 ENCOUNTER — Ambulatory Visit (INDEPENDENT_AMBULATORY_CARE_PROVIDER_SITE_OTHER): Payer: Medicare Other | Admitting: Neurology

## 2016-01-26 VITALS — BP 119/72 | HR 60 | Ht 63.0 in | Wt 131.8 lb

## 2016-01-26 DIAGNOSIS — I639 Cerebral infarction, unspecified: Secondary | ICD-10-CM | POA: Insufficient documentation

## 2016-01-26 DIAGNOSIS — I63131 Cerebral infarction due to embolism of right carotid artery: Secondary | ICD-10-CM | POA: Diagnosis not present

## 2016-01-26 MED ORDER — PRAVASTATIN SODIUM 40 MG PO TABS: 40.0000 mg | ORAL_TABLET | Freq: Every day | ORAL | Status: DC

## 2016-01-26 NOTE — Patient Instructions (Signed)
I had a long d/w patient and husband about her recent stroke, risk for recurrent stroke/TIAs, personally independently reviewed imaging studies and stroke evaluation results and answered questions.Continue Eliquis  for secondary stroke prevention given h/o atrial fibrillation and maintain strict control of hypertension with blood pressure goal below 130/90, diabetes with hemoglobin A1c goal below 6.5% and lipids with LDL cholesterol goal below 70 mg/dL. I also advised the patient to eat a healthy diet with plenty of whole grains, cereals, fruits and vegetables, exercise regularly and maintain ideal body weight Followup in the future with stroke NP in 6 months or call earlier if needed Stroke Prevention Some medical conditions and behaviors are associated with an increased chance of having a stroke. You may prevent a stroke by making healthy choices and managing medical conditions. HOW CAN I REDUCE MY RISK OF HAVING A STROKE?   Stay physically active. Get at least 30 minutes of activity on most or all days.  Do not smoke. It may also be helpful to avoid exposure to secondhand smoke.  Limit alcohol use. Moderate alcohol use is considered to be:  No more than 2 drinks per day for men.  No more than 1 drink per day for nonpregnant women.  Eat healthy foods. This involves:  Eating 5 or more servings of fruits and vegetables a day.  Making dietary changes that address high blood pressure (hypertension), high cholesterol, diabetes, or obesity.  Manage your cholesterol levels.  Making food choices that are high in fiber and low in saturated fat, trans fat, and cholesterol may control cholesterol levels.  Take any prescribed medicines to control cholesterol as directed by your health care provider.  Manage your diabetes.  Controlling your carbohydrate and sugar intake is recommended to manage diabetes.  Take any prescribed medicines to control diabetes as directed by your health care  provider.  Control your hypertension.  Making food choices that are low in salt (sodium), saturated fat, trans fat, and cholesterol is recommended to manage hypertension.  Ask your health care provider if you need treatment to lower your blood pressure. Take any prescribed medicines to control hypertension as directed by your health care provider.  If you are 40-85 years of age, have your blood pressure checked every 3-5 years. If you are 94 years of age or older, have your blood pressure checked every year.  Maintain a healthy weight.  Reducing calorie intake and making food choices that are low in sodium, saturated fat, trans fat, and cholesterol are recommended to manage weight.  Stop drug abuse.  Avoid taking birth control pills.  Talk to your health care provider about the risks of taking birth control pills if you are over 62 years old, smoke, get migraines, or have ever had a blood clot.  Get evaluated for sleep disorders (sleep apnea).  Talk to your health care provider about getting a sleep evaluation if you snore a lot or have excessive sleepiness.  Take medicines only as directed by your health care provider.  For some people, aspirin or blood thinners (anticoagulants) are helpful in reducing the risk of forming abnormal blood clots that can lead to stroke. If you have the irregular heart rhythm of atrial fibrillation, you should be on a blood thinner unless there is a good reason you cannot take them.  Understand all your medicine instructions.  Make sure that other conditions (such as anemia or atherosclerosis) are addressed. SEEK IMMEDIATE MEDICAL CARE IF:   You have sudden weakness or numbness of  the face, arm, or leg, especially on one side of the body.  Your face or eyelid droops to one side.  You have sudden confusion.  You have trouble speaking (aphasia) or understanding.  You have sudden trouble seeing in one or both eyes.  You have sudden trouble  walking.  You have dizziness.  You have a loss of balance or coordination.  You have a sudden, severe headache with no known cause.  You have new chest pain or an irregular heartbeat. Any of these symptoms may represent a serious problem that is an emergency. Do not wait to see if the symptoms will go away. Get medical help at once. Call your local emergency services (911 in [place] Do not drive yourself to the hospital.   This information is not intended to replace advice given to you by your health care provider. Make sure you discuss any questions you have with your health care provider.   Document Released: 12/23/2004 Document Revised: 12/06/2014 Document Reviewed: 05/18/2013 Elsevier Interactive Patient Education [employer].

## 2016-01-26 NOTE — Progress Notes (Signed)
[facility] [address] [place]. [place] [address] [phone]       OFFICE FOLLOW-UP NOTE  [name] Date of Birth:  [birth year] Medical Record Number:  [id number]   HPI: 51 year Caucasian lady seen today for first office follow-up visit for hospital admission for stroke in January 2017. [name] is an 75 y.o. female with a past medical history significant for HTN HLD, PAF not taking anticoagulant, ovarian cancer stage IIIB on chemotherapy, initially admitted to [facility] on 1/5 with hypertensive urgency and NSTEMI. She underwent successful cardiac cath 12/05/15 via right radial artery and was noted to be very lethargic after procedure. Family  stated that shortly after cath they noticed that she was not able to see well. Patient was not quite sure if her vision declined afterwards but she said  she had a very peculiar vision change in which she will see her husband " sitting in a chair but also standing and walking from side to side". At the same time, she expressed that she couldn't see almost anything in her left peripheral vision. Stated that these changes lasted approximately 4 hours and then her vision returned to normal. Denies associated HA, vertigo, double vision, focal weakness or numbness, slurred speech, confusion, or language impairment. Her symptoms prompted MRI brain that revealed several small areas of acute infarct in the right posterior temporoparietal lobe just above the tentorium. On plavix and ASA 81 mg daily. Her last known well was uncertain. Patient was not administered TPA secondary to being out of the window. MRI scan of the brain showed small acute infarcts in the right temporoparietal lobe and chronic microhemorrhages in the left parietal lobe and left cerebellum likely due to chronic hypertension. Carotid ultrasound showed no significant extra-axial stenosis. 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 Hemoglobin A1c was 4.9 and LDL cholesterol was elevated at 148. Patient was on aspirin and Plavix prior to readmission and given prior history of atrial fibrillation and likely hypercoagulability from ovarian cancer she was switched to eliquis after discussion with the cardiologist and oncologist. Patient is tolerating eliquis well without significant bleeding and only minor bruising. She states she's made full recovery and a peripheral vision has improved significantly. She did follow-up with ophthalmologist and was found to have good visual fields and advised to drive. She states her blood pressure is well controlled and today it is 119/72 in office. She plans to start chemotherapy next week and sees her oncologist Dr. Delfin Gant. She has tried Lipitor in the past but had to stop it due to skin itching. She has not tried pravastatin. I discussed with her alternatives including the new PC SK 9 inhibitors but she would prefer to try pravacholl first. ROS:   14 system review of systems is positive for back pain, joint pain, frequency of urination, constipation, vision difficulty, bruising and all other systems negative  PMH:  Past Medical History  Diagnosis Date  . Hypertension   . Dyslipidemia   . Hypothyroidism   . Arthritis     OSTEOARTHRITIS   -- CONSTANT PAIN RIGHT HIP---AND PAIN LEFT KNEE--PT STATES SHE GETS INJECTIONS INTO HER KNEE  . PAF (paroxysmal atrial fibrillation) (HCC)     Only on Plavix -- not full Anticoagulation per pt.  request; On Amiodarone (Eye Exam 12/25/15)  . Complication of anesthesia     BLOOD PRESSURE DROPPED WITH NASAL SURGERY, ONE OF THE CARPAL TUNNEL REPAIRS AND DURING A COLONOSCOPY  . History of skin cancer   . Ovarian cancer ([facility]) 01/2013    Recurrence since 2014/2016  . NSTEMI (non-ST  elevated myocardial infarction) ([facility]) 12/09/15    Medical management: Distal branch of D1 95%, ostial D2 75%, distal LAD 50%. Tortuous arteries consistent with hypertension  . Menopausal symptoms     Social History:  Social History   Social History  . Marital Status: Married    Spouse Name: N/A  . Number of Children: N/A  . Years of Education: N/A   Occupational History  . Not on file.   Social History Main Topics  . Smoking status: Never Smoker   . Smokeless tobacco: Never Used  . Alcohol Use: No  . Drug Use: No  . Sexual Activity: Not Currently   Other Topics Concern  . Not on file   Social History Narrative   Married mother of 3, grandmother 31.   Exercises 6/7 days a week walking 30 minutes a time.   Never smoked or drank alcohol.    Medications:   Current Outpatient Prescriptions on File Prior to Visit  Medication Sig Dispense Refill  . amiodarone (PACERONE) 200 MG tablet Take 0.5 tablets (100 mg total) by mouth daily. 45 tablet 3  . amoxicillin (AMOXIL) 500 MG capsule Reported on 12/25/2015    . apixaban (ELIQUIS) 5 MG TABS tablet Take 1 tablet (5 mg total) by mouth 2 (two) times daily. 60 tablet 5  . bisacodyl (DULCOLAX) 5 MG EC tablet Take 5 mg by mouth daily as needed for moderate constipation. Reported on 12/25/2015    . desonide (DESOWEN) 0.05 % cream Apply 1 application topically 2 (two) times daily.   1  . diphenhydrAMINE (SOMINEX) 25 MG tablet Take 25-50 mg by mouth at bedtime as needed for sleep. Reported on 12/25/2015    . docusate sodium (COLACE) 100 MG capsule Take 200 mg by mouth 2 (two) times daily.     . Glucosamine HCl (GLUCOSAMINE PO) Take 1,000 mg by mouth every morning.     . irbesartan (AVAPRO) 300 MG tablet TAKE 1 TABLET (300 MG TOTAL) BY MOUTH DAILY. 90 tablet 0  . levothyroxine (SYNTHROID) 88 MCG tablet Take 88 mcg by mouth daily before breakfast.    . lidocaine-prilocaine (EMLA) cream Apply to Porta-Cath site 1-2 hours prior to access as  directed. 30 g 2  . loratadine (CLARITIN) 10 MG tablet Take 10 mg by mouth daily as needed for allergies. Reported on 12/25/2015    . magnesium hydroxide (MILK OF MAGNESIA) 800 MG/5ML suspension Take 30 mLs by mouth daily as needed for constipation. Reported on 11/20/2015    . Melatonin 5 MG CAPS Take 1 capsule by mouth at bedtime as needed (sleep). Reported on 12/25/2015    . Menthol, Topical Analgesic, (ICY HOT EX) Apply 1 application topically daily as needed (pain.).    Marland Kitchen metoprolol tartrate (LOPRESSOR) 25 MG tablet Take 1 tablet (25 mg total) by mouth 2 (two) times daily. (Patient taking differently: Take 12.5 mg by mouth 2 (two) times daily. ) 60 tablet 0  . Multiple Vitamin (MULTIVITAMIN WITH MINERALS) TABS tablet Take 1 tablet by mouth every morning.     . ondansetron (ZOFRAN) 8 MG tablet Take 1 tablet (8 mg total) by mouth every 8 (eight) hours as needed for  nausea or vomiting (Will not make drowsy). 30 tablet 1  . pantoprazole (PROTONIX) 40 MG tablet Take 1 tablet (40 mg total) by mouth daily. 90 tablet 1  . Polyethyl Glycol-Propyl Glycol (SYSTANE OP) Place 1 drop into both eyes 2 (two) times daily.     . polyethylene glycol (MIRALAX / GLYCOLAX) packet Take 17 g by mouth every morning.     Marland Kitchen spironolactone (ALDACTONE) 25 MG tablet Take 25 mg by mouth daily.     . potassium chloride (K-DUR) 10 MEQ tablet Take 10 mEq by mouth every Monday, Wednesday, and Friday.     No current facility-administered medications on file prior to visit.    Allergies:   Allergies  Allergen Reactions  . Coreg [Carvedilol] Shortness Of Breath  . Ceftin [Cefuroxime Axetil]     Interferes with propafenone.    . Clonidine Derivatives   . Codeine Other (See Comments)    Does not like the feeling she gets  . Flexeril [Cyclobenzaprine] Other (See Comments)    Pt states "increased heart rate"  . Levaquin [Levofloxacin In D5w]     Interferes with propafenone.   . Lipitor [Atorvastatin] Dermatitis    "feels  like bugs are biting her"   . Lisinopril     LIP NUMBNESS  . Tegaderm Ag Mesh [Silver]   . Tape Rash    TEGADERM.   (use opsite on PAC)  . Ultram [Tramadol] Palpitations    Physical Exam General: Frail elderly Caucasian lady, seated, in no evident distress Head: head normocephalic and atraumatic.  Neck: supple with no carotid or supraclavicular bruits Cardiovascular: regular rate and rhythm, no murmurs Musculoskeletal: no deformity Skin:  no rash/petichiae Vascular:  Normal pulses all extremities Filed Vitals:   01/26/16 0938  BP: 119/72  Pulse: 60   Neurologic Exam Mental Status: Awake and fully alert. Oriented to place and time. Recent and remote memory intact. Attention span, concentration and fund of knowledge appropriate. Mood and affect appropriate.  Cranial Nerves: Fundoscopic exam reveals sharp disc margins. Pupils equal, briskly reactive to light. Extraocular movements full without nystagmus. Visual fields Show very minimum left hemifield loss  to  Bedside confrontational testing. Hearing intact. Facial sensation intact. Face, tongue, palate moves normally and symmetrically.  Motor: Normal bulk and tone. Normal strength in all tested extremity muscles. Sensory.: intact to touch ,pinprick .position and vibratory sensation.  Coordination: Rapid alternating movements normal in all extremities. Finger-to-nose and heel-to-shin performed accurately bilaterally. Gait and Station: Arises from chair without difficulty. Stance is normal. Gait demonstrates normal stride length and balance . Able to heel, toe and tandem walk without difficulty.  Reflexes: 1+ and symmetric. Toes downgoing.   NIHSS  1 Modified Rankin  2  ASSESSMENT: 75 year old Caucasian lady with small right temporal MCA are branch embolic infarcts following cardiac catheterization procedure but with vascular risk factors of atrial fibrillation, hypertension, hyperlipidemia, hypercoagulability from ovarian  cancer    PLAN: I had a long d/w patient and husband about her recent stroke, risk for recurrent stroke/TIAs, personally independently reviewed imaging studies and stroke evaluation results and answered questions.Continue Eliquis  for secondary stroke prevention given h/o atrial fibrillation as well as likely hypercoagulability from her ovarian cancer and maintain strict control of hypertension with blood pressure goal below 130/90, diabetes with hemoglobin A1c goal below 6.5% and lipids with LDL cholesterol goal below 70 mg/dL. I also advised the patient to eat a healthy diet with plenty of whole grains, cereals, fruits and vegetables, exercise regularly and  maintain ideal body weight . Follow-up with oncologist for chemotherapy for ovarian cancer. Greater than 50% of time during this 25 minute visit was spent on counseling,explanation of diagnosis, planning of further management, discussion with patient and family and coordination of care .Followup in the future with stroke NP in 6 months or call earlier if needed Antony Contras, MD Note: This document was prepared with digital dictation and possible smart phrase technology. Any transcriptional errors that result from this process are unintentional

## 2016-02-01 ENCOUNTER — Other Ambulatory Visit: Payer: Self-pay | Admitting: Oncology

## 2016-02-01 DIAGNOSIS — C562 Malignant neoplasm of left ovary: Principal | ICD-10-CM

## 2016-02-01 DIAGNOSIS — C563 Malignant neoplasm of bilateral ovaries: Secondary | ICD-10-CM

## 2016-02-01 DIAGNOSIS — C561 Malignant neoplasm of right ovary: Secondary | ICD-10-CM

## 2016-02-02 ENCOUNTER — Encounter: Payer: Self-pay | Admitting: Oncology

## 2016-02-02 ENCOUNTER — Other Ambulatory Visit (HOSPITAL_BASED_OUTPATIENT_CLINIC_OR_DEPARTMENT_OTHER): Payer: Medicare Other

## 2016-02-02 ENCOUNTER — Ambulatory Visit (HOSPITAL_BASED_OUTPATIENT_CLINIC_OR_DEPARTMENT_OTHER): Payer: Medicare Other

## 2016-02-02 ENCOUNTER — Ambulatory Visit (HOSPITAL_BASED_OUTPATIENT_CLINIC_OR_DEPARTMENT_OTHER): Payer: Medicare Other | Admitting: Oncology

## 2016-02-02 ENCOUNTER — Telehealth: Payer: Self-pay | Admitting: Oncology

## 2016-02-02 VITALS — BP 124/65 | HR 63 | Temp 97.6°F | Resp 18 | Ht 63.0 in | Wt 132.1 lb

## 2016-02-02 DIAGNOSIS — C562 Malignant neoplasm of left ovary: Principal | ICD-10-CM

## 2016-02-02 DIAGNOSIS — G620 Drug-induced polyneuropathy: Secondary | ICD-10-CM

## 2016-02-02 DIAGNOSIS — C5701 Malignant neoplasm of right fallopian tube: Secondary | ICD-10-CM | POA: Diagnosis not present

## 2016-02-02 DIAGNOSIS — I214 Non-ST elevation (NSTEMI) myocardial infarction: Secondary | ICD-10-CM

## 2016-02-02 DIAGNOSIS — C5702 Malignant neoplasm of left fallopian tube: Secondary | ICD-10-CM

## 2016-02-02 DIAGNOSIS — C569 Malignant neoplasm of unspecified ovary: Secondary | ICD-10-CM

## 2016-02-02 DIAGNOSIS — D696 Thrombocytopenia, unspecified: Secondary | ICD-10-CM

## 2016-02-02 DIAGNOSIS — I10 Essential (primary) hypertension: Secondary | ICD-10-CM

## 2016-02-02 DIAGNOSIS — C561 Malignant neoplasm of right ovary: Secondary | ICD-10-CM | POA: Diagnosis not present

## 2016-02-02 DIAGNOSIS — K5900 Constipation, unspecified: Secondary | ICD-10-CM

## 2016-02-02 DIAGNOSIS — K219 Gastro-esophageal reflux disease without esophagitis: Secondary | ICD-10-CM

## 2016-02-02 DIAGNOSIS — C7889 Secondary malignant neoplasm of other digestive organs: Secondary | ICD-10-CM

## 2016-02-02 DIAGNOSIS — C7989 Secondary malignant neoplasm of other specified sites: Secondary | ICD-10-CM

## 2016-02-02 DIAGNOSIS — I480 Paroxysmal atrial fibrillation: Secondary | ICD-10-CM

## 2016-02-02 DIAGNOSIS — E039 Hypothyroidism, unspecified: Secondary | ICD-10-CM

## 2016-02-02 DIAGNOSIS — C563 Malignant neoplasm of bilateral ovaries: Secondary | ICD-10-CM

## 2016-02-02 DIAGNOSIS — I639 Cerebral infarction, unspecified: Secondary | ICD-10-CM

## 2016-02-02 LAB — CBC WITH DIFFERENTIAL/PLATELET
BASO%: 0.2 % (ref 0.0–2.0)
BASOS ABS: 0 10*3/uL (ref 0.0–0.1)
EOS ABS: 0 10*3/uL (ref 0.0–0.5)
EOS%: 1 % (ref 0.0–7.0)
HCT: 36.9 % (ref 34.8–46.6)
HGB: 12.8 g/dL (ref 11.6–15.9)
LYMPH%: 38.6 % (ref 14.0–49.7)
MCH: 34.7 pg — AB (ref 25.1–34.0)
MCHC: 34.7 g/dL (ref 31.5–36.0)
MCV: 100 fL (ref 79.5–101.0)
MONO#: 0.6 10*3/uL (ref 0.1–0.9)
MONO%: 13.2 % (ref 0.0–14.0)
NEUT#: 2 10*3/uL (ref 1.5–6.5)
NEUT%: 47 % (ref 38.4–76.8)
PLATELETS: 134 10*3/uL — AB (ref 145–400)
RBC: 3.69 10*6/uL — AB (ref 3.70–5.45)
RDW: 13.2 % (ref 11.2–14.5)
WBC: 4.2 10*3/uL (ref 3.9–10.3)
lymph#: 1.6 10*3/uL (ref 0.9–3.3)

## 2016-02-02 LAB — COMPREHENSIVE METABOLIC PANEL
ALBUMIN: 4.1 g/dL (ref 3.5–5.0)
ALT: 20 U/L (ref 0–55)
AST: 19 U/L (ref 5–34)
Alkaline Phosphatase: 72 U/L (ref 40–150)
Anion Gap: 10 mEq/L (ref 3–11)
BUN: 14.2 mg/dL (ref 7.0–26.0)
CHLORIDE: 103 meq/L (ref 98–109)
CO2: 26 mEq/L (ref 22–29)
Calcium: 9.6 mg/dL (ref 8.4–10.4)
Creatinine: 0.8 mg/dL (ref 0.6–1.1)
EGFR: 76 mL/min/{1.73_m2} — ABNORMAL LOW (ref >90)
GLUCOSE: 92 mg/dL (ref 70–140)
POTASSIUM: 4.1 meq/L (ref 3.5–5.1)
SODIUM: 139 meq/L (ref 136–145)
Total Bilirubin: 0.71 mg/dL (ref 0.20–1.20)
Total Protein: 6.9 g/dL (ref 6.4–8.3)

## 2016-02-02 MED ORDER — SODIUM CHLORIDE 0.9% FLUSH
10.0000 mL | INTRAVENOUS | Status: DC | PRN
  Administered 2016-02-02: 10 mL via INTRAVENOUS
  Filled 2016-02-02: qty 10

## 2016-02-02 MED ORDER — HEPARIN SOD (PORK) LOCK FLUSH 100 UNIT/ML IV SOLN
500.0000 [IU] | Freq: Once | INTRAVENOUS | Status: AC
  Administered 2016-02-02: 500 [IU] via INTRAVENOUS
  Filled 2016-02-02: qty 5

## 2016-02-02 NOTE — Telephone Encounter (Signed)
appt made and avs printed. CT to be scheduled with central radiology

## 2016-02-02 NOTE — Progress Notes (Signed)
OFFICE PROGRESS NOTE   February 04, 2016   Physicians: D. 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 Last imaging was CT CAP 11-06-15 and PET 11-18-15. Further treatment for metastatic involvement at splenic hilum and right pelvis has been on hold due to NSTEMI and acute CVA early 11-2015.  Patient has progressively improved in last several weeks. She is now on Eliquis instead of ASA/ plavix. She had PFTs done for ongoing amiodarone, next apt with Dr Ellyn Hack 03-01-16. She had neurology follow up with Dr Leonie Man in late Feb, to see him again in 06-2016. She has visit with Dr Virgina Jock later this week. She stopped estrace after our discussion on 12-25-15, reports that she had some mild symptoms for a few weeks after DC but now no problems at all including no hot flashes or mood swings.   Energy is much better, now walking 1-2 miles daily outdoors with husband.  Appetite is good. Chronic constipation is at baseline. No bleeding. No chest pain or palpitations. No SOB with present activity level. No LE swelling. No abdominal or pelvic discomfort. No fever or symptoms of infection. No problems with PAC.  Remainder of 10 point Review of Systems negative / unchanged.     Flu vaccine 09-12-15 PAC flushed 02-02-16 Genetics testing BreastOvarian panel by [facility] sent 06-26-15: normal CA 125 on 05-01-15 33  ONCOLOGIC HISTORY Patient presented in late Jan 2014 with 2 weeks of spotting in early Dec 2015. Sonohystogram 01-05-13 showed uterus normal size and echotexture, endometrium 4.3 mm, left ovary normal and right adnexa with 1.1 x 8.4.x8.2 cm cystic and solid mass. Endometrial biopsy benign and CA 125 also on 01-05-13 was 178.8. CT AP 01-17-13 with 1.0 x 6.9 x 8.9 cm complex right  ovarian mass, no ascites, small retroperitoneal nodes. She was seen by Dr Skeet Latch on 01-18-13 and taken to surgery by Dr Josephina Shih on 02-13-13, which was TAH/BSO/ omentectomy/ureterolysis/ resection of cul-de-sac tumor/right pelvic lymphadnectomy and resection of rectum with reanastomosis. At completion of surgery there was no gross residual disease. 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 Washings [phone]) had rare clusters of atypical cells. Chemotherapy with dose dense taxol carboplatin was begun day 1 cycle 1 on 03-20-13; ANC was 1.1 on day 15 cycle 1 with taxol given and neupogen added 04-04-13. She had day 1 cycle 2 treatment on 04-17-13, then was briefly hospitalized 5-22 to 04-20-13 after syncopal episode, with antihypertensive agents DCd and UTI treated. She was feeling much better at time of "day 8" cycle 2 on 05-01-13 and did have neupogen 300 mcg x 1 dose on 05-02-13. She was readmitted to hospital 6-5 thru 05-05-13 with fever, empirically on antibiotics and blood cultures negative. Cycle 6 completed 08-14-13. CT AP 09-25-13 had no evidence of cancer and CA 125 was 18.3 on 07-24-2013. Marker was 20 in early Dec 2015, 26 on 01-31-15 and 35 on 03-14-15. PET 04-03-15 documented disease bilateral pelvic sidewalls, vaginal cuff and scattered areas thru abdomen/ pelvis, as well as area of uptake in spleen. She resumed carboplatin taxol using q 3 week regimen on 05-01-15. She was neutropenic with ANC  0.3 on day 9 despite beginning granix on day 5. She had 6 cycles of carbo taxol from 05-01-15 thru 08-25-15, with avastin cycles 2,3,4, 6. CT AP 09-10-15 showed resolution of involvement other than in spleen, which was improved. She saw Dr Josephina Shih on 09-12-15 , with  discussion of splenectomy followed by avastin vs 3 additional cycles of chemo + avastin then reassess. Patient preferred additional treatment to splenectomy, with carbo taxol avastin resumed 09-29-15 and treated again 10-21-15. She tolerated treatment progressively more poorly, such that 1 planned additional cycle was held. CT CAP 11-06-15 and PET 11-18-15 showed persistent disease in area of splenic hilum (between stomach and spleen) and at 1 cm right pelvic sidewall node.  She had NSTEMI with marked HTN early Jan 2017, then CVA.   Objective:  Vital signs in last 24 hours:  BP 124/65 mmHg  Pulse 63  Temp(Src) 97.6 F (36.4 C) (Oral)  Resp 18  Ht 5' 3"  (1.6 m)  Wt 132 lb 1.6 oz (59.92 kg)  BMI 23.41 kg/m2  SpO2 100% Weight up 1 lb. Alert, oriented and appropriate. Ambulatory without difficulty. Looks entirely comfortable, smiling and in good spirits. Husband very supportive. Alopecia  HEENT:PERRL, sclerae not icteric. Oral mucosa moist without lesions, posterior pharynx clear.  Neck supple. No JVD.  Lymphatics:no cervical,supraclavicular or inguinal adenopathy Resp: clear to auscultation bilaterally and normal percussion bilaterally Cardio: regular rate and rhythm. No gallop. GI: soft, nontender, not distended, no mass or organomegaly. A few bowel sounds. Surgical incision not remarkable. Musculoskeletal/ Extremities: without pitting edema, cords, tenderness Neuro: speech fluent, no focal deficits. PSYCH much brighter mood and affect Skin without rash, ecchymosis, petechiae Portacath-without erythema or tenderness  Lab Results:  Results for orders placed or performed in visit on 02/02/16  CBC with Differential  Result Value Ref Range   WBC 4.2 3.9 - 10.3 10e3/uL   NEUT# 2.0 1.5 - 6.5 10e3/uL   HGB 12.8 11.6 - 15.9 g/dL   HCT 36.9 34.8 - 46.6 %   Platelets 134 (L) 145 - 400 10e3/uL   MCV 100.0 79.5 - 101.0 fL   MCH 34.7 (H) 25.1 - 34.0 pg   MCHC 34.7 31.5 - 36.0 g/dL   RBC  3.69 (L) 3.70 - 5.45 10e6/uL   RDW 13.2 11.2 - 14.5 %   lymph# 1.6 0.9 - 3.3 10e3/uL   MONO# 0.6 0.1 - 0.9 10e3/uL   Eosinophils Absolute 0.0 0.0 - 0.5 10e3/uL   Basophils Absolute 0.0 0.0 - 0.1 10e3/uL   NEUT% 47.0 38.4 - 76.8 %   LYMPH% 38.6 14.0 - 49.7 %   MONO% 13.2 0.0 - 14.0 %   EOS% 1.0 0.0 - 7.0 %   BASO% 0.2 0.0 - 2.0 %  Comprehensive metabolic panel  Result Value Ref Range   Sodium 139 136 - 145 mEq/L   Potassium 4.1 3.5 - 5.1 mEq/L   Chloride 103 98 - 109 mEq/L   CO2 26 22 - 29 mEq/L   Glucose 92 70 - 140 mg/dl   BUN 14.2 7.0 - 26.0 mg/dL   Creatinine 0.8 0.6 - 1.1 mg/dL   Total Bilirubin 0.71 0.20 - 1.20 mg/dL   Alkaline Phosphatase 72 40 - 150 U/L   AST 19 5 - 34 U/L   ALT 20 0 - 55 U/L   Total Protein 6.9 6.4 - 8.3 g/dL   Albumin 4.1 3.5 - 5.0 g/dL   Calcium 9.6 8.4 - 10.4 mg/dL   Anion Gap 10 3 -  11 mEq/L   EGFR 76 (L) >90 ml/min/1.73 m2  CA 125  Result Value Ref Range   Cancer Antigen (CA) 125 13.5 0.0 - 38.1 U/mL  CA 125 (Parallel Testing)  Result Value Ref Range   CA 125 13 <35 U/mL   CA 125 had been 13 by "parallel testing" and 11.5 by new methodology (corresponds to 13.5 above) on 12-25-15, and 17 by "parallel testing" on 11-20-15.  Studies/Results:  No results found.  Medications: I have reviewed the patient's current medications.   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 Adjuvant dose dense carboplatin taxol completed 08-14-2013. Progressive disease spring 2016, asymptomatic. Resumed chemotherapy with carboplatin and taxol on 05-01-15, addition of avastin with cycle 2. Restaging CT 08-2015 after 6 cycles improved, with residual in spleen. Additional 2 cycles carbo taxol avastin given 09-29-15 and 10-21-15, tolerated poorly including hypertension. CT CAP and PET 10-2015 residual disease in region of splenic hilum and 1  right pelvic node. Following complications of NSTEMI and CVA 11-2015, with very poor tolerance of chemotherapy and contraindication for additional avastin, treatment has been held. Patient is clinically doing much better over last several weeks. Will repeat CT AP as now 3 months since imaging.  May still be reasonable to consider directed radiation. NOTE stopped estrogen as of late Jan 2017, which may be a therapeutic intervention by itself. 2.NSTEMI with marked HTN on 12-03-15, then acute CVA ~ 12-07-15 either from cath or PAF. Now on Eliquis, cardiology and neurology involved  3.HTN: exacerbated by avastin, now seems to be improving 2 months out from last dose. BP may continue to improve over next couple of months, and she may require less antihypertensives over time. 4.paroxysmal Afib: followed by cardiology, now on Eliquis 5.minimal thrombocytopenia possibly medication related, would not be from last chemo now. Still in reasonable range for anticoagulation 6.chronic constipation, required increased laxatives with chemo, up to date on colonoscopy.  7..hypothyroid on replacement by PCP. Diffuse uptake left lobe thyroid by scans, per radiologist consistent with thyroiditis 8. Long estrogen replacement: Testing done on surgical path from 02-13-13 shows some positivity for ER PR (tho was on same estrogen at that time). Patient decided to stop estrogen after last visit, which may be a significant intervention from standpoint of the cancer; additional estrogen blocker would also be consideration if needed in future, not discussed now.  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 Breast tissue heterogeneously dense by last tomo mammograms at [facility] 03-21-15, those mammograms otherwise negative.  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 11. GERD: better with protonix, continue. 12. flu vaccine 09-12-15 13.PAC in 14.Left knee replacement 12-2014, doing well. 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 I will see her back to discuss results of CT upcoming. Time spent 25 min including >50% counseling and coordination of care. Copies of today's labs given to patient other than CA 125, will let her know that result also. Cc Dr Esaw Dace, MD   02/04/2016, 11:13 AM

## 2016-02-03 LAB — CA 125: Cancer Antigen (CA) 125: 13.5 U/mL (ref 0.0–38.1)

## 2016-02-03 LAB — CANCER ANTIGEN 125 (PARALLEL TESTING): CA 125: 13 U/mL (ref <35)

## 2016-02-05 ENCOUNTER — Telehealth: Payer: Self-pay

## 2016-02-05 NOTE — Telephone Encounter (Signed)
-----   Message from Gordy Levan, MD sent at 02/02/2016 11:18 AM EST ----- Need to let patient know results of CA 125 from 3-6  Please let me know before you call her if either of the 2 lab method results higher than previous  thanks

## 2016-02-05 NOTE — Telephone Encounter (Signed)
lvm that per Dr Edwyna Shell the Ca 125 is essentially unchanged.

## 2016-02-09 ENCOUNTER — Ambulatory Visit (HOSPITAL_COMMUNITY)
Admission: RE | Admit: 2016-02-09 | Discharge: 2016-02-09 | Disposition: A | Discharge status: Home / Self Care | Payer: Medicare Other | Source: Ambulatory Visit | Attending: Oncology | Admitting: Oncology

## 2016-02-09 DIAGNOSIS — C7989 Secondary malignant neoplasm of other specified sites: Secondary | ICD-10-CM | POA: Diagnosis not present

## 2016-02-09 DIAGNOSIS — C562 Malignant neoplasm of left ovary: Secondary | ICD-10-CM | POA: Insufficient documentation

## 2016-02-09 DIAGNOSIS — C561 Malignant neoplasm of right ovary: Secondary | ICD-10-CM | POA: Diagnosis present

## 2016-02-09 DIAGNOSIS — C563 Malignant neoplasm of bilateral ovaries: Secondary | ICD-10-CM

## 2016-02-09 DIAGNOSIS — C7889 Secondary malignant neoplasm of other digestive organs: Secondary | ICD-10-CM | POA: Diagnosis not present

## 2016-02-09 MED ORDER — IOHEXOL 300 MG/ML  SOLN
100.0000 mL | Freq: Once | INTRAMUSCULAR | Status: AC | PRN
  Administered 2016-02-09: 100 mL via INTRAVENOUS

## 2016-02-16 ENCOUNTER — Other Ambulatory Visit: Payer: Self-pay | Admitting: Cardiology

## 2016-02-16 DIAGNOSIS — J310 Chronic rhinitis: Secondary | ICD-10-CM | POA: Insufficient documentation

## 2016-02-16 DIAGNOSIS — H6123 Impacted cerumen, bilateral: Secondary | ICD-10-CM | POA: Insufficient documentation

## 2016-02-16 DIAGNOSIS — H9193 Unspecified hearing loss, bilateral: Secondary | ICD-10-CM | POA: Insufficient documentation

## 2016-02-16 NOTE — Telephone Encounter (Signed)
Rx request sent to pharmacy.  

## 2016-02-20 ENCOUNTER — Other Ambulatory Visit: Payer: Self-pay

## 2016-02-20 DIAGNOSIS — C7989 Secondary malignant neoplasm of other specified sites: Secondary | ICD-10-CM

## 2016-02-20 DIAGNOSIS — C561 Malignant neoplasm of right ovary: Secondary | ICD-10-CM

## 2016-02-20 DIAGNOSIS — C562 Malignant neoplasm of left ovary: Secondary | ICD-10-CM

## 2016-02-20 DIAGNOSIS — C563 Malignant neoplasm of bilateral ovaries: Secondary | ICD-10-CM

## 2016-02-20 DIAGNOSIS — C7889 Secondary malignant neoplasm of other digestive organs: Secondary | ICD-10-CM

## 2016-02-22 ENCOUNTER — Other Ambulatory Visit: Payer: Self-pay | Admitting: Oncology

## 2016-02-23 ENCOUNTER — Ambulatory Visit (HOSPITAL_BASED_OUTPATIENT_CLINIC_OR_DEPARTMENT_OTHER): Payer: Medicare Other

## 2016-02-23 ENCOUNTER — Encounter: Payer: Self-pay | Admitting: Oncology

## 2016-02-23 ENCOUNTER — Other Ambulatory Visit (HOSPITAL_BASED_OUTPATIENT_CLINIC_OR_DEPARTMENT_OTHER): Payer: Medicare Other

## 2016-02-23 ENCOUNTER — Ambulatory Visit (HOSPITAL_BASED_OUTPATIENT_CLINIC_OR_DEPARTMENT_OTHER): Payer: Medicare Other | Admitting: Oncology

## 2016-02-23 VITALS — BP 117/60 | HR 58 | Temp 97.6°F | Resp 18 | Ht 63.0 in | Wt 132.1 lb

## 2016-02-23 DIAGNOSIS — C7989 Secondary malignant neoplasm of other specified sites: Secondary | ICD-10-CM

## 2016-02-23 DIAGNOSIS — Z95828 Presence of other vascular implants and grafts: Secondary | ICD-10-CM

## 2016-02-23 DIAGNOSIS — C562 Malignant neoplasm of left ovary: Secondary | ICD-10-CM

## 2016-02-23 DIAGNOSIS — G620 Drug-induced polyneuropathy: Secondary | ICD-10-CM

## 2016-02-23 DIAGNOSIS — T451X5A Adverse effect of antineoplastic and immunosuppressive drugs, initial encounter: Secondary | ICD-10-CM

## 2016-02-23 DIAGNOSIS — I10 Essential (primary) hypertension: Secondary | ICD-10-CM

## 2016-02-23 DIAGNOSIS — K5900 Constipation, unspecified: Secondary | ICD-10-CM

## 2016-02-23 DIAGNOSIS — C561 Malignant neoplasm of right ovary: Secondary | ICD-10-CM

## 2016-02-23 DIAGNOSIS — E039 Hypothyroidism, unspecified: Secondary | ICD-10-CM

## 2016-02-23 DIAGNOSIS — C5701 Malignant neoplasm of right fallopian tube: Secondary | ICD-10-CM | POA: Diagnosis not present

## 2016-02-23 DIAGNOSIS — C563 Malignant neoplasm of bilateral ovaries: Secondary | ICD-10-CM

## 2016-02-23 DIAGNOSIS — C7889 Secondary malignant neoplasm of other digestive organs: Secondary | ICD-10-CM

## 2016-02-23 DIAGNOSIS — I480 Paroxysmal atrial fibrillation: Secondary | ICD-10-CM

## 2016-02-23 DIAGNOSIS — D696 Thrombocytopenia, unspecified: Secondary | ICD-10-CM

## 2016-02-23 DIAGNOSIS — C5702 Malignant neoplasm of left fallopian tube: Secondary | ICD-10-CM | POA: Diagnosis not present

## 2016-02-23 DIAGNOSIS — I214 Non-ST elevation (NSTEMI) myocardial infarction: Secondary | ICD-10-CM

## 2016-02-23 DIAGNOSIS — K219 Gastro-esophageal reflux disease without esophagitis: Secondary | ICD-10-CM

## 2016-02-23 DIAGNOSIS — C569 Malignant neoplasm of unspecified ovary: Secondary | ICD-10-CM

## 2016-02-23 DIAGNOSIS — I639 Cerebral infarction, unspecified: Secondary | ICD-10-CM

## 2016-02-23 LAB — CBC WITH DIFFERENTIAL/PLATELET
BASO%: 0.8 % (ref 0.0–2.0)
Basophils Absolute: 0 10*3/uL (ref 0.0–0.1)
EOS ABS: 0 10*3/uL (ref 0.0–0.5)
EOS%: 0.7 % (ref 0.0–7.0)
HEMATOCRIT: 35.2 % (ref 34.8–46.6)
HGB: 11.9 g/dL (ref 11.6–15.9)
LYMPH#: 1.6 10*3/uL (ref 0.9–3.3)
LYMPH%: 30.8 % (ref 14.0–49.7)
MCH: 34.4 pg — AB (ref 25.1–34.0)
MCHC: 33.8 g/dL (ref 31.5–36.0)
MCV: 101.7 fL — AB (ref 79.5–101.0)
MONO#: 0.6 10*3/uL (ref 0.1–0.9)
MONO%: 11.5 % (ref 0.0–14.0)
NEUT%: 56.2 % (ref 38.4–76.8)
NEUTROS ABS: 2.9 10*3/uL (ref 1.5–6.5)
PLATELETS: 158 10*3/uL (ref 145–400)
RBC: 3.46 10*6/uL — ABNORMAL LOW (ref 3.70–5.45)
RDW: 13.4 % (ref 11.2–14.5)
WBC: 5.2 10*3/uL (ref 3.9–10.3)

## 2016-02-23 LAB — COMPREHENSIVE METABOLIC PANEL
ALBUMIN: 3.8 g/dL (ref 3.5–5.0)
ALT: 24 U/L (ref 0–55)
ANION GAP: 7 meq/L (ref 3–11)
AST: 23 U/L (ref 5–34)
Alkaline Phosphatase: 65 U/L (ref 40–150)
BUN: 15.1 mg/dL (ref 7.0–26.0)
CO2: 26 meq/L (ref 22–29)
Calcium: 9.3 mg/dL (ref 8.4–10.4)
Chloride: 103 mEq/L (ref 98–109)
Creatinine: 0.8 mg/dL (ref 0.6–1.1)
EGFR: 76 mL/min/{1.73_m2} — AB (ref >90)
GLUCOSE: 97 mg/dL (ref 70–140)
Potassium: 4.4 mEq/L (ref 3.5–5.1)
SODIUM: 135 meq/L — AB (ref 136–145)
TOTAL PROTEIN: 6.7 g/dL (ref 6.4–8.3)
Total Bilirubin: 0.85 mg/dL (ref 0.20–1.20)

## 2016-02-23 MED ORDER — HEPARIN SOD (PORK) LOCK FLUSH 100 UNIT/ML IV SOLN
500.0000 [IU] | Freq: Once | INTRAVENOUS | Status: AC
  Administered 2016-02-23: 500 [IU] via INTRAVENOUS
  Filled 2016-02-23: qty 5

## 2016-02-23 MED ORDER — SODIUM CHLORIDE 0.9% FLUSH
10.0000 mL | INTRAVENOUS | Status: DC | PRN
  Administered 2016-02-23: 10 mL via INTRAVENOUS
  Filled 2016-02-23: qty 10

## 2016-02-23 NOTE — Progress Notes (Signed)
OFFICE PROGRESS NOTE   February 23, 2016   Physicians: D. ClarkePearson; J.Russo, T.Fontaine, Glenetta Hew (cardiology), [name], D.Jacobs, Zollie Beckers, P.Sethi, J.Moody, D.Shoemaker  INTERVAL HISTORY:  Patient is seen, alone for visit, in continuing attention to recurrent ovarian cancer, having had CT AP 3-13 17 as we make next treatment decision. The CT shows similar area between stomach and spleen, with no "enlarged" nodes (1 cm right deep pelvic node + on PET 11-17-16). She continues Eliquis following NSTEMI and acute CVA 11-2015.  Patient has felt generally well, with no symptoms from the recurrent ovarian cancer. Allergic rhinorrhea is improved with nasal spray by [facility]. She is walking ~1.5 miles daily, energy good. She denies bleeding, chest pain, HA, SOB, abdominal or pelvic symptoms. Chronic constipation unchanged, bowels do move with present laxative regimen. Appetite is good without nausea/ vomiting. No LE swelling. No problems with PAC. Occasional hot flashes at night, not too bothersome. No bleeding. OTC lozenges at hs per dentist have helped dry mouth. Remainder of 10 point Review of Systems negative/ unchanged.    Flu vaccine 09-12-15 PAC flushed 02-02-16 Genetics testing BreastOvarian panel by [facility] sent 06-26-15: normal CA 125 on 05-01-15 33  ONCOLOGIC HISTORY  Patient presented in late Jan 2014 with 2 weeks of spotting in early Dec 2015. Sonohystogram 01-05-13 showed uterus normal size and echotexture, endometrium 4.3 mm, left ovary normal and right adnexa with 1.1 x 8.4.x8.2 cm cystic and solid mass. Endometrial biopsy benign and CA 125 also on 01-05-13 was 178.8. CT AP 01-17-13 with 1.0 x 6.9 x 8.9 cm complex right ovarian mass, no ascites, small retroperitoneal nodes. She was seen by Dr Skeet Latch on 01-18-13 and taken to surgery by Dr Josephina Shih on 02-13-13, which was TAH/BSO/ omentectomy/ureterolysis/ resection of cul-de-sac tumor/right pelvic lymphadnectomy and resection  of rectum with reanastomosis. At completion of surgery there was no gross residual disease. 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 Washings [phone]) had rare clusters of atypical cells. Chemotherapy with dose dense taxol carboplatin was begun day 1 cycle 1 on 03-20-13; ANC was 1.1 on day 15 cycle 1 with taxol given and neupogen added 04-04-13. She had day 1 cycle 2 treatment on 04-17-13, then was briefly hospitalized 5-22 to 04-20-13 after syncopal episode, with antihypertensive agents DCd and UTI treated. She was feeling much better at time of "day 8" cycle 2 on 05-01-13 and did have neupogen 300 mcg x 1 dose on 05-02-13. She was readmitted to hospital 6-5 thru 05-05-13 with fever, empirically on antibiotics and blood cultures negative. Cycle 6 completed 08-14-13. CT AP 09-25-13 had no evidence of cancer and CA 125 was 18.3 on 07-24-2013. Marker was 20 in early Dec 2015, 26 on 01-31-15 and 35 on 03-14-15. PET 04-03-15 documented disease bilateral pelvic sidewalls, vaginal cuff and scattered areas thru abdomen/ pelvis, as well as area of uptake in spleen. She resumed carboplatin taxol using q 3 week regimen on 05-01-15. She was neutropenic with ANC 0.3 on day 9 despite beginning granix on day 5. She had 6 cycles of carbo taxol from 05-01-15 thru 08-25-15, with avastin cycles 2,3,4, 6. CT AP 09-10-15 showed resolution of involvement other than in spleen, which was improved. She saw Dr Josephina Shih on 09-12-15 , with discussion of splenectomy followed by avastin vs 3 additional cycles  of chemo + avastin then reassess. Patient preferred additional treatment to splenectomy, with carbo taxol avastin resumed 09-29-15 and treated again 10-21-15. She tolerated treatment  progressively more poorly, such that 1 planned additional cycle was held. CT CAP 11-06-15 and PET 11-18-15 showed persistent disease in area of splenic hilum (between stomach and spleen) and at 1 cm right pelvic sidewall node.  She had NSTEMI with marked HTN early Jan 2017, then CVA.  .  Objective:  Vital signs in last 24 hours:  BP 117/60 mmHg  Pulse 58  Temp(Src) 97.6 F (36.4 C) (Oral)  Resp 18  Ht 5' 3"  (1.6 m)  Wt 132 lb 1.6 oz (59.92 kg)  BMI 23.41 kg/m2  SpO2 100% Weight stable Alert, oriented and appropriate. Ambulatory without difficulty, able to get on and off exam table. No alopecia  HEENT:PERRL, sclerae not icteric. Oral mucosa moist without lesions, posterior pharynx clear.  Neck supple. No JVD.  Lymphatics:no cervical,supraclavicular, axillary or inguinal adenopathy Resp: clear to auscultation bilaterally and normal percussion bilaterally Cardio: regular rate and rhythm. No gallop. GI: soft, nontender, mildly distended as usual, no mass or organomegaly. A few bowel sounds. Surgical incision not remarkable. Musculoskeletal/ Extremities: without pitting edema, cords, tenderness Neuro: no increase peripheral neuropathy. Otherwise nonfocal. PSYCH appropriate mood and affect Skin without rash, ecchymosis, petechiae Breasts:bilaterally without dominant mass, skin or nipple findings. Axillae benign Portacath-without erythema or tenderness  Lab Results:  Results for orders placed or performed in visit on 02/23/16  CBC with Differential  Result Value Ref Range   WBC 5.2 3.9 - 10.3 10e3/uL   NEUT# 2.9 1.5 - 6.5 10e3/uL   HGB 11.9 11.6 - 15.9 g/dL   HCT 35.2 34.8 - 46.6 %   Platelets 158 145 - 400 10e3/uL   MCV 101.7 (H) 79.5 - 101.0 fL   MCH 34.4 (H) 25.1 - 34.0 pg   MCHC 33.8 31.5 - 36.0 g/dL   RBC 3.46 (L) 3.70 - 5.45 10e6/uL   RDW 13.4 11.2 - 14.5 %   lymph# 1.6 0.9 - 3.3 10e3/uL   MONO# 0.6 0.1 - 0.9 10e3/uL   Eosinophils Absolute 0.0 0.0 - 0.5 10e3/uL    Basophils Absolute 0.0 0.0 - 0.1 10e3/uL   NEUT% 56.2 38.4 - 76.8 %   LYMPH% 30.8 14.0 - 49.7 %   MONO% 11.5 0.0 - 14.0 %   EOS% 0.7 0.0 - 7.0 %   BASO% 0.8 0.0 - 2.0 %  Comprehensive metabolic panel  Result Value Ref Range   Sodium 135 (L) 136 - 145 mEq/L   Potassium 4.4 3.5 - 5.1 mEq/L   Chloride 103 98 - 109 mEq/L   CO2 26 22 - 29 mEq/L   Glucose 97 70 - 140 mg/dl   BUN 15.1 7.0 - 26.0 mg/dL   Creatinine 0.8 0.6 - 1.1 mg/dL   Total Bilirubin 0.85 0.20 - 1.20 mg/dL   Alkaline Phosphatase 65 40 - 150 U/L   AST 23 5 - 34 U/L   ALT 24 0 - 55 U/L   Total Protein 6.7 6.4 - 8.3 g/dL   Albumin 3.8 3.5 - 5.0 g/dL   Calcium 9.3 8.4 - 10.4 mg/dL   Anion Gap 7 3 - 11 mEq/L   EGFR 76 (L) >90 ml/min/1.73 m2     Studies/Results:  EXAM: CT ABDOMEN AND PELVIS WITH CONTRAST  TECHNIQUE: Multidetector CT imaging of the abdomen and pelvis was performed using the standard protocol following bolus administration of intravenous contrast.  CONTRAST: 168m OMNIPAQUE IOHEXOL 300 MG/ML SOLN  COMPARISON: 11/18/2015  FINDINGS: Lower chest: There is no pleural fluid. No pericardial effusion.  Hepatobiliary: No suspicious liver abnormalities identified. The gallbladder appears normal. There is no biliary dilatation.  Pancreas: Normal appearance of the pancreas.  Spleen: See below  Adrenals/Urinary Tract: The adrenal glands are normal. Normal appearance of the left kidney. Tiny right renal cyst noted. No hydronephrosis. The urinary bladder is normal.  Stomach/Bowel: The small bowel loops have a normal caliber. There is no pathologic dilatation of the colon. A moderate stool burden is identified throughout the colon up to the level of the rectum.  Vascular/Lymphatic: Calcified atherosclerotic disease involves the abdominal aorta. No aneurysm. No enlarged retroperitoneal or mesenteric adenopathy. No enlarged pelvic or inguinal lymph nodes.  Reproductive: Previous  hysterectomy. No adnexal mass identified.  Other: The previously referenced peritoneal implant between the posterior wall of stomach and the spleen measures 3.9 by 2.4 by 3.3 cm. On the previous exam this measured 3.5 x 3.3 x 3.2 cm. No new peritoneal deposits identified.  Musculoskeletal: No aggressive lytic or sclerotic bone lesions identified.  IMPRESSION: 1. Continued decrease in size of peritoneal lesion between the posterior wall of stomach and spleen. 2. No new or progressive disease identified  PACS images reviewed with patient.   Patient recalls last bone density scan at Dr Russo's office ~ [phone] normal bone density.  Medications: I have reviewed the patient's current medications. Begin letrozole 2.5 mg daily  DISCUSSION Prior to visit today,  recent course of events and CT information was communicated to Dr CJosephina Shih He is in favor of letrozole now, in preference to radiation or further chemotherapy.  I have discussed with patient fact that stopping her long term exogenous estrogen in late Jan hopefully was a therapeutic intervention for the ER + gyn malignancy. I have explained rationale for further estrogen blockade in this situation, specifically with aromatase inhibitor. We have discussed possible side effects of the AI, including  exacerbation of hot flashes, arthralgias, decrease in bone density, clotting (on full anticoagulation). I have told her that, even with these possible side effects, I expect she will tolerate oral hormonal blocker better than other systemic treatment.  At completion of discussion, patient is in agreement with beginning letrozole.   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 Adjuvant dose dense carboplatin taxol completed 08-14-2013. Progressive disease spring 2016, asymptomatic. Resumed chemotherapy with carboplatin  and taxol on 05-01-15, addition of avastin with cycle 2. Restaging CT 08-2015 after 6 cycles improved, with residual in spleen. Additional 2 cycles carbo taxol avastin given 09-29-15 and 10-21-15, tolerated poorly including hypertension. CT CAP and PET 10-2015 residual disease in region of splenic hilum and 1 right pelvic node. Following complications of NSTEMI and CVA 11-2015, with very poor tolerance of chemotherapy and contraindication for additional avastin, treatment was  held. Patient is clinically doing much better over last several weeks.  NOTE stopped estrogen as of late Jan 2017, which may be a therapeutic intervention by itself. WIll begin aromatase inhibitor letrozole now. I will see her back in ~ 4 weeks with labs. 2.NSTEMI with marked HTN on 12-03-15, then acute CVA ~ 12-07-15 either from cath or PAF. Now on Eliquis, cardiology and neurology involved  3.HTN: exacerbated by avastin: BP probably no longer showing avastin effects (last avastin 10-21-15) 4.paroxysmal Afib: followed by cardiology, now on Eliquis 5.minimal thrombocytopenia possibly medication related, would not  be from last chemo now. Still in reasonable range for anticoagulation 6.chronic constipation, required increased laxatives with chemo, up to date on colonoscopy.  7..hypothyroid on replacement by PCP. Diffuse uptake left lobe thyroid by scans, per radiologist consistent with thyroiditis 8. Long estrogen replacement: Testing done on surgical path from 02-13-13 shows some positivity for ER PR (even tho was on same estrogen at that time). Patient DCd estrogen in late Jan 2017, tolerating minimal hot flashes now. Add estrogen blocker as noted 9.benign fibroadenoma left breast 02-2014. Breast tissue heterogeneously dense by last tomo mammograms at [facility] 03-21-15. 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 11. GERD: better with protonix, continue. 12. flu vaccine 09-12-15 13.PAC in : flush at  least every 6-8 weeks when not otherwise used. 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 Right hip replacement 2013 for osteoarthitis 15.last bone density scan at Dr Keane Police office ~ [phone], reportedly normal. For repeat in 2 years, appropriate with plans for aromatase inhibitor  All questions answered and patient is in agreement with recommendations and plans. Time spent 35 min including >50% counseling and coordination of care. CC Dr Esaw Dace, MD   02/23/2016, 5:01 PM

## 2016-02-24 ENCOUNTER — Telehealth: Payer: Self-pay | Admitting: Oncology

## 2016-02-24 ENCOUNTER — Other Ambulatory Visit: Payer: Self-pay

## 2016-02-24 ENCOUNTER — Telehealth: Payer: Self-pay | Admitting: Cardiology

## 2016-02-24 ENCOUNTER — Telehealth: Payer: Self-pay

## 2016-02-24 DIAGNOSIS — Z1231 Encounter for screening mammogram for malignant neoplasm of breast: Secondary | ICD-10-CM

## 2016-02-24 MED ORDER — EZETIMIBE 10 MG PO TABS: 10.0000 mg | ORAL_TABLET | Freq: Every day | ORAL | Status: DC

## 2016-02-24 MED ORDER — LETROZOLE 2.5 MG PO TABS: 2.5000 mg | ORAL_TABLET | Freq: Every day | ORAL | Status: DC

## 2016-02-24 NOTE — Telephone Encounter (Signed)
Would suggest giving up on statins, with 2 plus red yeast rice, all causing same side effect. Can consider ezetimibe 10 mg daily or PCSK-9 inhibitor

## 2016-02-24 NOTE — Telephone Encounter (Signed)
Spoke to patient to confirm fu appt in April per LL 3/27 pof

## 2016-02-24 NOTE — Telephone Encounter (Signed)
Left message to call back  

## 2016-02-24 NOTE — Telephone Encounter (Signed)
-----   Message from Gordy Levan, MD sent at 02/23/2016  4:52 PM EDT ----- Script for letrozole 2.5 mg daily #30 2 RF  She is not picking it up until tomorrow at earliest  thanks

## 2016-02-24 NOTE — Telephone Encounter (Signed)
SPOKE TO PATIENT SHE STATES SHE IS EXPERIENCING FEELING "BUG BITES"SYMPTOMS ON CALF, AND FEET. NOT CONSTANT BUT OCCUR THROUGHOUT THE DAY ,SINCE SHE HAS BEEN TAKING PRAVASTATIN. SHE STATES SHE HAS NOT TRIED ANY NEW DETERGENT OR FABRIC SOFTENER. SHE STATES THIS OCCURRED WHEN SHE USED RED YEAST RICE.  RN IN FORMED PATIENT TO HOLD PRAVASTATIN UNTIL OFFICE CONTACT HER BACK WILL DEFER TO PHARMACIST, DR HARDNIG

## 2016-02-24 NOTE — Telephone Encounter (Signed)
Thanks [employer].  [place]

## 2016-02-24 NOTE — Telephone Encounter (Signed)
S/w pt that Rx is e-scribed now.

## 2016-02-24 NOTE — Telephone Encounter (Signed)
New message     Pt c/o medication issue:  1. Name of Medication:  pravastatin 2. How are you currently taking this medication (dosage and times per day)? 40mg  3. Are you having a reaction (difficulty breathing--STAT)? No 4. What is your medication issue? Pt states that since starting the medication, she is having a pinching sensation on her legs.  Feels like bug bites but nothing is there

## 2016-02-24 NOTE — Telephone Encounter (Signed)
SPOKE TO PATIENT  INFORMATION GIVEN SHE STATES SHE WILL TRY ZETIA RN SENT REFILL #[phone] X [phone]

## 2016-02-25 ENCOUNTER — Telehealth: Payer: Self-pay

## 2016-02-25 NOTE — Telephone Encounter (Signed)
Patient called to inform Dr. Marko Plume that she picked up the letrozole and read the side effects- the second one listed is hair loss.  Patient states she specifically asked Dr. Marko Plume about hair loss.

## 2016-02-25 NOTE — Telephone Encounter (Signed)
I do not expect hair loss with letrozole, tho very occasionally patients feel that they notice some slight thinning of hair.  I still think that, of our options, this is a good choice.   Thank you

## 2016-02-26 NOTE — Telephone Encounter (Signed)
Patient aware of the thinning however will go forward and start the medication.

## 2016-03-01 ENCOUNTER — Ambulatory Visit (INDEPENDENT_AMBULATORY_CARE_PROVIDER_SITE_OTHER): Payer: Medicare Other | Admitting: Cardiology

## 2016-03-01 ENCOUNTER — Encounter: Payer: Self-pay | Admitting: Cardiology

## 2016-03-01 VITALS — BP 96/52 | HR 59 | Ht 63.0 in | Wt 131.6 lb

## 2016-03-01 DIAGNOSIS — I2510 Atherosclerotic heart disease of native coronary artery without angina pectoris: Secondary | ICD-10-CM

## 2016-03-01 DIAGNOSIS — Z79899 Other long term (current) drug therapy: Secondary | ICD-10-CM

## 2016-03-01 DIAGNOSIS — I10 Essential (primary) hypertension: Secondary | ICD-10-CM | POA: Diagnosis not present

## 2016-03-01 DIAGNOSIS — I480 Paroxysmal atrial fibrillation: Secondary | ICD-10-CM | POA: Diagnosis not present

## 2016-03-01 DIAGNOSIS — I214 Non-ST elevation (NSTEMI) myocardial infarction: Secondary | ICD-10-CM

## 2016-03-01 DIAGNOSIS — I6330 Cerebral infarction due to thrombosis of unspecified cerebral artery: Secondary | ICD-10-CM

## 2016-03-01 DIAGNOSIS — E785 Hyperlipidemia, unspecified: Secondary | ICD-10-CM

## 2016-03-01 NOTE — Progress Notes (Signed)
PCP: Precious Reel, MD  Clinic Note: Chief Complaint  Patient presents with  . Follow-up    pt c/o right ankle swelling    HPI: [name] is a 75 y.o. female with a PMH below who presents today for 3 month follow-up for CAD, A. fib.  [name] was last seen on 12/16/2015 as a follow-up from her hospitalization in January 2017 for management to be A. fib RVR and accelerated hypertension leading to mild troponin elevation. This then led to cardiac catheterization complicated by mild stroke is now subsequently resolved from a symptom standpoint. She was converted from aspirin/Plavix to [facility]. She was also taken off of Rythmol (as a result of CAD noted on cardiac catheterization) and started on amiodarone. -- During that visit, I decided to treat her lipids. She ended up being started on Zetia due to concern for potential side effects from statins. Her concern was the right ankle, but this was construed as myalgias. -- I ordered PFTs and recommended routine monitoring of thyroid function, liver function as well as annual exam.  Recent Hospitalizations: None since last visit  Studies Reviewed:  PFTs 12/23/2015: The FVC, FEV1, FEV1/FVC ratio and FEF25-75% are within normal limits. Lung volumes are within normal limits. Following administration of bronchodilators, there is no significant response. The reduced diffusing capacity indicates a moderate loss of functional alveolar capillary surface. Pulmonary Function Diagnosis:  Moderate Diffusion Defect  Interval History: [name] presents today really not having any major complaints from a cardiac standpoint she is complaining about pain and swelling in her right ankle. She doesn't remember any injury to it. She's concerned is related to her medications. I think that the reason why we stopped her statin it hurts with walking or touching. It is unilateral localized swelling and therefore probably not cardiac in nature. I explained  this to her and she seemed to agree. From a cardiac standpoint, she really hasn't noticed any flutters or palpitations since being on amiodarone. This is actually much less symptoms and she even had with Rythmol. She's not had any bleeding issues on ELIQUIS. She is just very leery of taking amiodarone ELIQUIS. She also asked what Zetia was explained to her mechanism of action of Zetia. Blood pressure standpoint she brought with her blood pressure recordings that range from 105/58 mmHg up to 127/67 mmHg. She has not had any dizziness or lightheadedness wooziness. No headaches or blurred vision. She's not had any chest tightness or pressure with rest or exertion. No heart failure symptoms of PND, orthopnea or edema. No TIA/amaurosis fugax symptoms. No recurrent symptoms to suggest any residual effect from her stroke. No syncope or near syncope.  No claudication.  ROS: A comprehensive was performed. Review of Systems  Constitutional: Negative for malaise/fatigue.  HENT: Negative for congestion.   Respiratory: Negative for cough, shortness of breath and wheezing.   Cardiovascular: Negative for leg swelling (Right ankle only, not foot or leg swelling.).       Per history of present illness  Gastrointestinal: Negative for blood in stool and melena.  Genitourinary: Negative for hematuria.  Musculoskeletal: Positive for joint pain (Right ankle pain and swelling. It is painful to walk on it and painful touch.).  Neurological: Positive for dizziness (Occasionally with change in position. Nothing significant.). Negative for speech change, focal weakness, seizures, loss of consciousness and headaches.  Psychiatric/Behavioral: Negative for depression and memory loss. The patient is not nervous/anxious and does not have insomnia.     Past Medical  History  Diagnosis Date  . Hypertension   . Dyslipidemia   . Hypothyroidism   . Arthritis     OSTEOARTHRITIS   -- CONSTANT PAIN RIGHT HIP---AND PAIN LEFT  KNEE--PT STATES SHE GETS INJECTIONS INTO HER KNEE  . PAF (paroxysmal atrial fibrillation) ([place])     Only on Plavix -- not full Anticoagulation per pt. request; On Amiodarone (Eye Exam 12/25/15)  . Complication of anesthesia     BLOOD PRESSURE DROPPED WITH NASAL SURGERY, ONE OF THE CARPAL TUNNEL REPAIRS AND DURING A COLONOSCOPY  . History of skin cancer   . Ovarian cancer ([place]) 01/2013    Recurrence since 2014/2016  . NSTEMI (non-ST elevated myocardial infarction) ([place]) 12/09/15    Medical management: Distal branch of D1 95%, ostial D2 75%, distal LAD 50%. Tortuous arteries consistent with hypertension  . Menopausal symptoms     Past Surgical History  Procedure Laterality Date  . Bilateral carpal tunnel repair  2007  . Dilation and curettage of uterus  1969  . Surgery for ruptured ovarian cyst  1969  . Rhinoplasty for fractured nose  1986  . Left knee arthroscopy   2011  . Total hip arthroplasty  02/25/2012    Procedure: TOTAL HIP ARTHROPLASTY ANTERIOR APPROACH;  Surgeon: Mcarthur Rossetti, MD;  Location: [facility];  Service: Orthopedics;  Laterality: Right;  . Tonsillectomy  1962  . Laparotomy Bilateral 02/13/2013    Procedure: EXPLORATORY LAPAROTOMY TOTAL ABDOMINAL HYSTERECTOMY BILATERAL SALPINGO-OOPHORECTOMY, Partial Rectal Resection with Reanastamosis;  Surgeon: Alvino Chapel, MD;  Location: [facility];  Service: Gynecology;  Laterality: Bilateral;  . Omentectomy  02/13/2013    Procedure: OMENTECTOMY;  Surgeon: Alvino Chapel, MD;  Location: [facility];  Service: Gynecology;;  . Lymphadenectomy Right 02/13/2013    Procedure: PEVLIC  LYMPHADENECTOMY, DEBULKING right pelvic tumor nodules;  Surgeon: Alvino Chapel, MD;  Location: [facility];  Service: Gynecology;  Laterality: Right;  . Tah/bso/tumor debulking with right pelvic lnd  01/2013  . Transthoracic echocardiogram  May 2014; January 2016    a. Normal LV size function. EF 60-65%. Grade 1 diastolic function. Mild MR and  mildly elevated PA pressures of 37 mmHg per;; b. EF 60-65%. Mobile echodensity 10 mm x 6 mm attest interventricular septum is questionable fibroblastoma.  . Nm myoview ltd  June 2010     subbmaximal with no ischemia or infarction.  . Total knee arthroplasty Left 01/03/2015    Procedure: LEFT TOTAL KNEE ARTHROPLASTY;  Surgeon: Mcarthur Rossetti, MD;  Location: [facility];  Service: Orthopedics;  Laterality: Left;  . Cardiac catheterization N/A 12/05/2015    Procedure: Left Heart Cath and Coronary Angiography;  Surgeon: Belva Crome, MD;  Location: [facility];  Service: Cardiovascular;  distal branch of D1 95%, ostial D2 75%, dLAD 50%,p-mRCA 40%. Tortuous vessels.   Prior to Admission medications   Medication Sig Start Date End Date Taking? Authorizing Provider  amiodarone (PACERONE) 200 MG tablet Take 0.5 tablets (100 mg total) by mouth daily. 12/16/15  Yes Leonie Man, MD  amLODipine (NORVASC) 10 MG tablet TAKE 1 TABLET (10 MG TOTAL) BY MOUTH DAILY. 02/16/16  Yes Leonie Man, MD  amoxicillin (AMOXIL) 500 MG capsule Reported on 12/25/2015 02/26/15  Yes Historical Provider, MD  apixaban (ELIQUIS) 5 MG TABS tablet Take 1 tablet (5 mg total) by mouth 2 (two) times daily. 01/01/16  Yes Leonie Man, MD  bisacodyl (DULCOLAX) 5 MG EC tablet Take 5 mg by mouth daily as  needed for moderate constipation. Reported on 12/25/2015   Yes Historical Provider, MD  desonide (DESOWEN) 0.05 % cream Apply 1 application topically 2 (two) times daily.  12/23/14  Yes Historical Provider, MD  diphenhydrAMINE (SOMINEX) 25 MG tablet Take 25-50 mg by mouth at bedtime as needed for sleep. Reported on 12/25/2015   Yes Historical Provider, MD  docusate sodium (COLACE) 100 MG capsule Take 200 mg by mouth at bedtime. And prn   Yes Historical Provider, MD  ezetimibe (ZETIA) 10 MG tablet Take 1 tablet (10 mg total) by mouth daily. 02/24/16  Yes Leonie Man, MD  famotidine (PEPCID) 10 MG tablet Take 10 mg by mouth. Takes  every other day   Yes Historical Provider, MD  Glucosamine HCl (GLUCOSAMINE PO) Take 1,000 mg by mouth every morning.    Yes Historical Provider, MD  ipratropium (ATROVENT) 0.06 % nasal spray Place 2 sprays into the nose 3 (three) times daily as needed. 2 sprays each nostril 02/16/16  Yes Historical Provider, MD  irbesartan (AVAPRO) 300 MG tablet TAKE 1 TABLET (300 MG TOTAL) BY MOUTH DAILY. 01/06/16  Yes Leonie Man, MD  letrozole [facility]) 2.5 MG tablet Take 1 tablet (2.5 mg total) by mouth daily. 02/24/16  Yes Lennis Marion Downer, MD  levothyroxine (SYNTHROID) 88 MCG tablet Take 88 mcg by mouth daily before breakfast.   Yes Historical Provider, MD  lidocaine-prilocaine (EMLA) cream Apply to Porta-Cath site 1-2 hours prior to access as directed. 04/22/15  Yes Lennis Marion Downer, MD  loratadine (CLARITIN) 10 MG tablet Take 10 mg by mouth daily as needed for allergies. Reported on 12/25/2015   Yes Historical Provider, MD  magnesium hydroxide (MILK OF MAGNESIA) 800 MG/5ML suspension Take 30 mLs by mouth daily as needed for constipation. Reported on 11/20/2015   Yes Historical Provider, MD  Melatonin 5 MG CAPS Take 1 capsule by mouth at bedtime as needed (sleep). Reported on 12/25/2015   Yes Historical Provider, MD  Menthol, Topical Analgesic, (ICY HOT EX) Apply 1 application topically daily as needed (pain.).   Yes Historical Provider, MD  metoprolol tartrate (LOPRESSOR) 25 MG tablet Take 1 tablet (25 mg total) by mouth 2 (two) times daily. Patient taking differently: Take 12.5 mg by mouth 2 (two) times daily.  12/09/15  Yes Allie Bossier, MD  Multiple Vitamin (MULTIVITAMIN WITH MINERALS) TABS tablet Take 1 tablet by mouth every morning.    Yes Historical Provider, MD  ondansetron (ZOFRAN) 8 MG tablet Take 1 tablet (8 mg total) by mouth every 8 (eight) hours as needed for nausea or vomiting (Will not make drowsy). 07/11/15  Yes Lennis Marion Downer, MD  pantoprazole (PROTONIX) 40 MG tablet Take 1 tablet (40 mg total)  by mouth daily. Patient taking differently: Take 40 mg by mouth. Jory Sims, Fri 10/28/15  Yes Leonie Man, MD  Polyethyl Glycol-Propyl Glycol (SYSTANE OP) Place 1 drop into both eyes 2 (two) times daily.    Yes Historical Provider, MD  polyethylene glycol (MIRALAX / GLYCOLAX) packet Take 17 g by mouth every morning.    Yes Historical Provider, MD  potassium chloride (K-DUR) 10 MEQ tablet Take 10 mEq by mouth every Monday, Wednesday, and Friday. 05/05/13  Yes Shon Baton, MD  spironolactone (ALDACTONE) 25 MG tablet Take 25 mg by mouth daily.  11/20/15  Yes Historical Provider, MD   Allergies  Allergen Reactions  . Coreg [Carvedilol] Shortness Of Breath  . Ceftin [Cefuroxime Axetil]     Interferes with propafenone.    Marland Kitchen  Clonidine Derivatives   . Codeine Other (See Comments)    Does not like the feeling she gets  . Flexeril [Cyclobenzaprine] Other (See Comments)    Pt states "increased heart rate"  . Levaquin [Levofloxacin In D5w]     Interferes with propafenone.   . Lipitor [Atorvastatin] Dermatitis    "feels like bugs are biting her"   . Lisinopril     LIP NUMBNESS  . Pravastatin Other (See Comments)    FEELS LIKE "BUG BITES" BITING HER LEGS   . Tegaderm Ag Mesh [Silver]   . Tape Rash    TEGADERM.   (use opsite on PAC)  . Ultram [Tramadol] Palpitations     Social History   Social History  . Marital Status: Married    Spouse Name: N/A  . Number of Children: N/A  . Years of Education: N/A   Social History Main Topics  . Smoking status: Never Smoker   . Smokeless tobacco: Never Used  . Alcohol Use: No  . Drug Use: No  . Sexual Activity: Not Currently   Other Topics Concern  . None   Social History Narrative   Married mother of 3, grandmother 23.   Exercises 6/7 days a week walking 30 minutes a time.   Never smoked or drank alcohol.   Family History  Problem Relation Age of Onset  . Hypertension Mother   . Basal cell carcinoma Mother   . AAA (abdominal aortic  aneurysm) Father   . Heart Problems Maternal Uncle   . Heart Problems Maternal Grandfather   . AAA (abdominal aortic aneurysm) Paternal Grandmother   . Prostate cancer Maternal Uncle 70  . Prostate cancer Other   . Other Daughter     one daughter had TAH-BSO at 87; other daughter will have one soon    Wt Readings from Last 3 Encounters:  03/01/16 131 lb 9.6 oz (59.693 kg)  02/23/16 132 lb 1.6 oz (59.92 kg)  02/02/16 132 lb 1.6 oz (59.92 kg)    PHYSICAL EXAM BP 96/52 mmHg  Pulse 59  Ht 5\' 3"  (1.6 m)  Wt 131 lb 9.6 oz (59.693 kg)  BMI 23.32 kg/m2 General appearance: alert, cooperative, appears stated age and no distress; wearing wig (loss of hair 2/2 chemo), In good spirits - pleasant afffect HEENT: [place]/AT, EOMI, MMM, anicteric sclera Neck: no adenopathy, no carotid bruit, no JVD and supple, symmetrical, trachea midline Lungs: CTAP, normal percussion bilaterally and Nonlabored, good air movement Heart: regular rate and rhythm, S1, S2 normal, no murmur, click, rub or gallop and normal apical impulse Abdomen: soft, non-tender; bowel sounds normal; no masses, no organomegaly Extremities: extremities normal, atraumatic, no cyanosis or edema; no significant bruising. Pulses: 2+ and symmetric Neurologic: Alert and oriented X 3, normal strength and tone. Normal symmetric reflexes. Normal coordination and gait; Cranial nerves: normal (II-XII grossly intact)    Adult ECG Report  Rate: 59 ;  Rhythm: normal sinus rhythm and 1 A-V block. (218). Normal axis, intervals and durations. Anterior T-wave inversion no longer present.;   Narrative Interpretation: Otherwise stable, improved EKG   Other studies Reviewed: Additional studies/ records that were reviewed today include:  Recent Labs:   Lab Results  Component Value Date   CHOL 232* 12/04/2015   HDL 70 12/04/2015   LDLCALC 148* 12/04/2015   TRIG 68 12/04/2015   CHOLHDL 3.3 12/04/2015    ASSESSMENT / PLAN: Problem List Items  Addressed This Visit    PAF (paroxysmal atrial fibrillation) (HCC) (Chronic)  Maintaining sinus rhythm on amiodarone. She seems to be feeling much more stable amiodarone. No bleeding issues on ELIQUIS for anticoagulation. -CHA2DS2-VASc Score of 7: HTN, DM, MI, Female, Age, CVA.  IF YOU HAVE BREAK THROUGH WITH YOUR HEART RATE ( IRREGULAR - AFIB) \. YOU MAY TAKE 2 FULL TABLETS OF THE AMIORADONE TO SEE IF YOU VCAN STOP THE AFIB- YOU CAN CALL THE OFFICE TO LET us KNOW.  IN GENERAL - for [facility] DENTAL --MAY HOLD 2 DOSES. BACK INJECTIONS-- MAY HOLD 4-5  DOSES.      On amiodarone therapy (Chronic)    Recheck PFTs. The baseline DLCO was a little bit low with moderate diffusion defect. For the results to her PCP. May warrant pulmonary evaluation and consultation. I don't think these levels were low enough to stop amiodarone, and I don't think is related to amiodarone. We will need to reassess in a year.  Whenever she has her lipids checked, we are checking LFTs. We will also add TSH.      Relevant Orders   EKG 12-Lead (Completed)   NSTEMI (non-ST elevated myocardial infarction) ([place]) - Primary    I think her positive troponin is probably demand ischemia in the setting of accelerated hypertension in A. fib RVR given the fact she had some relatively significant stenoses in smaller branch vessels.  No PCI targets however.      Relevant Orders   EKG 12-Lead (Completed)   Essential hypertension (Chronic)    If anything she is a little bit hypotensive today. Not symptomatic with it. For now would simply continue current medications. If she is feeling dizzy or woozy I gave her instructions in the patient instructions section discussing hold parameters for her hypertension medicines. For now will continue current dose of irbesartan and metoprolol. She is also on spironolactone.      Relevant Orders   EKG 12-Lead (Completed)   Dyslipidemia, goal LDL below 70 (Chronic)    We tried starting a  statin and she is concerned about her leg ankle pain. Week., The symptoms continued, however we decided to start with Zetia. If not able to obtain control with Zetia, I would want to consider Crestor. Check labs after his follow-up if not checked PCP after her next follow-up if not checked by PCP to time.       Cerebral thrombosis with cerebral infarction    No residual effect. Unfortunately, she now has to include this is a past history is considered a risk factor for CHA2DS2VASC score.      Atherosclerotic heart disease of native coronary artery without angina pectoris (Chronic)    No active anginal symptoms. She did have coronary disease and I reviewed the catheter is with her discuss with this means. She asked if she needs it but this pounds diagnosis on different forms, and I indicated that yes she does. She had side affects only started statin. For now we will just simply using Zetia. She is artery on ARB and beta blocker and stable dose.  She was converted from Rythmol to amiodarone.          Current medicines are reviewed at length with the patient today. (+/- concerns) She asked questions about ELIQUIS safety, mechanism of action of Zetia, amiodarone mechanism of action and side effects.  --All questions were answered and issues were discussed to the patient's satisfaction.  --> As a result of the multiple discussions and questions. Total of 45 minutes spent with the patient with greater than 50% of  the time being spent in direct consultation on these various issues.    The following changes have been made:  IF YOU ARE LIGHTHEADED -YOU MAY HOLD WHAT EVERY PILL IS NEXT IF SYSTOLIC BLOOD PRESSURE  LESS THAN OR EQUAL 100,   IF LESS THAN  OR EQUAL TO 110 --MAY TAKE 1/2 TABLET OF THE NEXT PILL THAT IS DUE.   IF YOU HAVE BREAK THROUGH WITH YOUR HEART RATE ( IRREGULAR - AFIB) \. YOU MAY TAKE 2 FULL TABLETS OF THE AMIORADONE TO SEE IF YOU VCAN STOP THE AFIB- YOU CAN CALL THE OFFICE TO  LET us KNOW.  IN GENERAL - for [facility] DENTAL --MAY HOLD 2 DOSES. BACK INJECTIONS-- MAY HOLD 4-5  DOSES.  Your physician wants you to follow-up in [place] DR [facility]--30 MIN APPOINTMENT.  Studies Ordered:   Orders Placed This Encounter  Procedures  . EKG 12-Lead      Leonie Man, M.D., M.S. Interventional Cardiologist   Pager # [phone] Phone # [phone] [address] [place], [place] [address]

## 2016-03-01 NOTE — Patient Instructions (Signed)
ANKLE SWELLING IS PROBABLY FROM ARTHRITIS - POSSIBLY USE AN ORTHOTICS  IF YOU ARE LIGHTHEADED -YOU MAY HOLD WHAT EVERY PILL IS NEXT IF SYSTOLIC BLOOD PRESSURE  LESS THAN OR EQUAL 100,   IF LESS THAN  OR EQUAL TO 110 --MAY TAKE 1/2 TABLET OF THE NEXT PILL THAT IS DUE.   IF YOU HAVE BREAK THROUGH WITH YOUR HEART RATE ( IRREGULAR - AFIB) \. YOU MAY TAKE 2 FULL TABLETS OF THE AMIORADONE TO SEE IF YOU VCAN STOP THE AFIB- YOU CAN CALL THE OFFICE TO LET us KNOW.  IN GENERAL - ELIQUIS DENTAL --MAY HOLD 2 DOSES. BACK INJECTIONS-- MAY HOLD 4-5  DOSES.  Your physician wants you to follow-up in [place] DR [facility]--30 MIN APPOINTMENT.  You will receive a reminder letter in the mail two months in advance. If you don't receive a letter, please call our office to schedule the follow-up appointment.  If you need a refill on your cardiac medications before your next appointment, please call your pharmacy.

## 2016-03-03 ENCOUNTER — Encounter: Payer: Self-pay | Admitting: General Practice

## 2016-03-03 DIAGNOSIS — I2510 Atherosclerotic heart disease of native coronary artery without angina pectoris: Secondary | ICD-10-CM | POA: Insufficient documentation

## 2016-03-03 NOTE — Assessment & Plan Note (Addendum)
Maintaining sinus rhythm on amiodarone. She seems to be feeling much more stable amiodarone. No bleeding issues on ELIQUIS for anticoagulation. -CHA2DS2-VASc Score of 7: HTN, DM, MI, Female, Age, CVA.  IF YOU HAVE BREAK THROUGH WITH YOUR HEART RATE ( IRREGULAR - AFIB) \. YOU MAY TAKE 2 FULL TABLETS OF THE AMIORADONE TO SEE IF YOU VCAN STOP THE AFIB- YOU CAN CALL THE OFFICE TO LET us KNOW.  IN GENERAL - for [facility] DENTAL --MAY HOLD 2 DOSES. BACK INJECTIONS-- MAY HOLD 4-5  DOSES.

## 2016-03-03 NOTE — Assessment & Plan Note (Signed)
No active anginal symptoms. She did have coronary disease and I reviewed the catheter is with her discuss with this means. She asked if she needs it but this pounds diagnosis on different forms, and I indicated that yes she does. She had side affects only started statin. For now we will just simply using Zetia. She is artery on ARB and beta blocker and stable dose.  She was converted from Rythmol to amiodarone.

## 2016-03-03 NOTE — Assessment & Plan Note (Signed)
No residual effect. Unfortunately, she now has to include this is a past history is considered a risk factor for CHA2DS2VASC score.

## 2016-03-03 NOTE — Assessment & Plan Note (Signed)
I think her positive troponin is probably demand ischemia in the setting of accelerated hypertension in A. fib RVR given the fact she had some relatively significant stenoses in smaller branch vessels.  No PCI targets however.

## 2016-03-03 NOTE — Assessment & Plan Note (Addendum)
Recheck PFTs. The baseline DLCO was a little bit low with moderate diffusion defect. For the results to her PCP. May warrant pulmonary evaluation and consultation. I don't think these levels were low enough to stop amiodarone, and I don't think is related to amiodarone. We will need to reassess in a year.  Whenever she has her lipids checked, we are checking LFTs. We will also add TSH.

## 2016-03-03 NOTE — Progress Notes (Signed)
Spiritual Care Note  Attempted f/u by phone.  Left VM of support, encouraging pt to call back if she would like to connected again.  [place], [place], [facility] Pager [phone] Voicemail  [phone]

## 2016-03-03 NOTE — Assessment & Plan Note (Addendum)
We tried starting a statin and she is concerned about her leg ankle pain. Week., The symptoms continued, however we decided to start with Zetia. If not able to obtain control with Zetia, I would want to consider Crestor. Check labs after his follow-up if not checked PCP after her next follow-up if not checked by PCP to time.

## 2016-03-03 NOTE — Assessment & Plan Note (Signed)
If anything she is a little bit hypotensive today. Not symptomatic with it. For now would simply continue current medications. If she is feeling dizzy or woozy I gave her instructions in the patient instructions section discussing hold parameters for her hypertension medicines. For now will continue current dose of irbesartan and metoprolol. She is also on spironolactone.

## 2016-03-04 ENCOUNTER — Other Ambulatory Visit: Payer: Self-pay | Admitting: Cardiology

## 2016-03-04 NOTE — Telephone Encounter (Signed)
Rx request sent to pharmacy.  

## 2016-03-05 ENCOUNTER — Encounter: Payer: Self-pay | Admitting: General Practice

## 2016-03-05 NOTE — Progress Notes (Signed)
Spiritual Care Note  Received return call from [facility], who describes herself as "doing great!" after so much stress in January.  (She was very committed to returning the call, having had repeated trouble reaching my voicemail and finally called the hospital operator for assistance.  Noteworthy persistence!)   [name] shared about her heart attack, strokes, and temporary blindness; coming through these challenges well has brought her such deep gratitude, peace, and engagement with life.  She reports very strong support from her [employer] class (prayers, meals, etc) and her family (esp husband and three children).  We talked about how she is in a "new spring" season after the hard winter.  She is aware of ongoing chaplain availability, but please also page as needs arise/circumstances change.  Thank you.  [place], [place], [facility] Pager [phone] Voicemail [phone]

## 2016-03-21 ENCOUNTER — Other Ambulatory Visit: Payer: Self-pay | Admitting: Oncology

## 2016-03-21 DIAGNOSIS — C562 Malignant neoplasm of left ovary: Principal | ICD-10-CM

## 2016-03-21 DIAGNOSIS — C563 Malignant neoplasm of bilateral ovaries: Secondary | ICD-10-CM

## 2016-03-21 DIAGNOSIS — C561 Malignant neoplasm of right ovary: Secondary | ICD-10-CM

## 2016-03-23 ENCOUNTER — Telehealth: Payer: Self-pay | Admitting: Oncology

## 2016-03-23 NOTE — Telephone Encounter (Signed)
Faxed records request to dr. Shon Baton

## 2016-03-25 ENCOUNTER — Other Ambulatory Visit: Payer: Self-pay | Admitting: Cardiology

## 2016-03-25 ENCOUNTER — Encounter: Payer: Self-pay | Admitting: Oncology

## 2016-03-25 ENCOUNTER — Ambulatory Visit (HOSPITAL_BASED_OUTPATIENT_CLINIC_OR_DEPARTMENT_OTHER): Payer: Medicare Other | Admitting: Oncology

## 2016-03-25 ENCOUNTER — Ambulatory Visit (HOSPITAL_BASED_OUTPATIENT_CLINIC_OR_DEPARTMENT_OTHER): Payer: Medicare Other

## 2016-03-25 ENCOUNTER — Telehealth: Payer: Self-pay | Admitting: Oncology

## 2016-03-25 ENCOUNTER — Other Ambulatory Visit (HOSPITAL_BASED_OUTPATIENT_CLINIC_OR_DEPARTMENT_OTHER): Payer: Medicare Other

## 2016-03-25 VITALS — BP 124/54 | HR 63 | Temp 97.5°F | Resp 18 | Wt 133.4 lb

## 2016-03-25 DIAGNOSIS — K219 Gastro-esophageal reflux disease without esophagitis: Secondary | ICD-10-CM

## 2016-03-25 DIAGNOSIS — Z7901 Long term (current) use of anticoagulants: Secondary | ICD-10-CM

## 2016-03-25 DIAGNOSIS — D696 Thrombocytopenia, unspecified: Secondary | ICD-10-CM

## 2016-03-25 DIAGNOSIS — C562 Malignant neoplasm of left ovary: Secondary | ICD-10-CM

## 2016-03-25 DIAGNOSIS — C5702 Malignant neoplasm of left fallopian tube: Secondary | ICD-10-CM | POA: Diagnosis not present

## 2016-03-25 DIAGNOSIS — I480 Paroxysmal atrial fibrillation: Secondary | ICD-10-CM

## 2016-03-25 DIAGNOSIS — K5900 Constipation, unspecified: Secondary | ICD-10-CM

## 2016-03-25 DIAGNOSIS — C569 Malignant neoplasm of unspecified ovary: Secondary | ICD-10-CM

## 2016-03-25 DIAGNOSIS — Z79899 Other long term (current) drug therapy: Secondary | ICD-10-CM

## 2016-03-25 DIAGNOSIS — E039 Hypothyroidism, unspecified: Secondary | ICD-10-CM

## 2016-03-25 DIAGNOSIS — Z95828 Presence of other vascular implants and grafts: Secondary | ICD-10-CM

## 2016-03-25 DIAGNOSIS — I214 Non-ST elevation (NSTEMI) myocardial infarction: Secondary | ICD-10-CM

## 2016-03-25 DIAGNOSIS — C561 Malignant neoplasm of right ovary: Secondary | ICD-10-CM

## 2016-03-25 DIAGNOSIS — I639 Cerebral infarction, unspecified: Secondary | ICD-10-CM

## 2016-03-25 DIAGNOSIS — C563 Malignant neoplasm of bilateral ovaries: Secondary | ICD-10-CM

## 2016-03-25 DIAGNOSIS — C5701 Malignant neoplasm of right fallopian tube: Secondary | ICD-10-CM

## 2016-03-25 DIAGNOSIS — C7989 Secondary malignant neoplasm of other specified sites: Secondary | ICD-10-CM

## 2016-03-25 DIAGNOSIS — C7889 Secondary malignant neoplasm of other digestive organs: Secondary | ICD-10-CM

## 2016-03-25 DIAGNOSIS — I10 Essential (primary) hypertension: Secondary | ICD-10-CM

## 2016-03-25 DIAGNOSIS — G620 Drug-induced polyneuropathy: Secondary | ICD-10-CM

## 2016-03-25 LAB — COMPREHENSIVE METABOLIC PANEL
ALT: 20 U/L (ref 0–55)
ANION GAP: 9 meq/L (ref 3–11)
AST: 19 U/L (ref 5–34)
Albumin: 4 g/dL (ref 3.5–5.0)
Alkaline Phosphatase: 64 U/L (ref 40–150)
BILIRUBIN TOTAL: 0.62 mg/dL (ref 0.20–1.20)
BUN: 16 mg/dL (ref 7.0–26.0)
CO2: 24 meq/L (ref 22–29)
CREATININE: 0.9 mg/dL (ref 0.6–1.1)
Calcium: 9.6 mg/dL (ref 8.4–10.4)
Chloride: 103 mEq/L (ref 98–109)
EGFR: 68 mL/min/{1.73_m2} — ABNORMAL LOW (ref >90)
GLUCOSE: 89 mg/dL (ref 70–140)
Potassium: 4.4 mEq/L (ref 3.5–5.1)
Sodium: 136 mEq/L (ref 136–145)
TOTAL PROTEIN: 6.6 g/dL (ref 6.4–8.3)

## 2016-03-25 LAB — CBC WITH DIFFERENTIAL/PLATELET
BASO%: 0.4 % (ref 0.0–2.0)
Basophils Absolute: 0 10*3/uL (ref 0.0–0.1)
EOS ABS: 0.1 10*3/uL (ref 0.0–0.5)
EOS%: 1.1 % (ref 0.0–7.0)
HCT: 33.5 % — ABNORMAL LOW (ref 34.8–46.6)
HGB: 11.6 g/dL (ref 11.6–15.9)
LYMPH%: 35.6 % (ref 14.0–49.7)
MCH: 35.2 pg — ABNORMAL HIGH (ref 25.1–34.0)
MCHC: 34.6 g/dL (ref 31.5–36.0)
MCV: 101.5 fL — ABNORMAL HIGH (ref 79.5–101.0)
MONO#: 0.6 10*3/uL (ref 0.1–0.9)
MONO%: 12.7 % (ref 0.0–14.0)
NEUT%: 50.2 % (ref 38.4–76.8)
NEUTROS ABS: 2.3 10*3/uL (ref 1.5–6.5)
NRBC: 0 % (ref 0–0)
PLATELETS: 131 10*3/uL — AB (ref 145–400)
RBC: 3.3 10*6/uL — AB (ref 3.70–5.45)
RDW: 13.4 % (ref 11.2–14.5)
WBC: 4.6 10*3/uL (ref 3.9–10.3)
lymph#: 1.6 10*3/uL (ref 0.9–3.3)

## 2016-03-25 MED ORDER — SODIUM CHLORIDE 0.9% FLUSH
10.0000 mL | INTRAVENOUS | Status: DC | PRN
  Administered 2016-03-25: 10 mL via INTRAVENOUS
  Filled 2016-03-25: qty 10

## 2016-03-25 NOTE — Telephone Encounter (Signed)
appt made and avs printed °

## 2016-03-25 NOTE — Telephone Encounter (Signed)
Rx refill sent to pharmacy. 

## 2016-03-25 NOTE — Progress Notes (Signed)
OFFICE PROGRESS NOTE   March 27, 2016   Physicians: D. ClarkePearson; J.Russo, T.Fontaine, Glenetta Hew (cardiology), [name], D.Jacobs, Zollie Beckers, P.Sethi, J.Moody, D.Shoemaker  INTERVAL HISTORY:  Patient is seen, alone for visit, in continuing attention to recurrent ovarian cancer. She was treated with [place] taxol avastin x 8 cycles thru 10-21-15, DCd very long term estrogen late 11-2015 and began letrozole late March 2017.  Last imaging was CT AP 02-09-16.  She had bone density scan by Dr Virgina Jock 10-2014, normal bone density. She continues Eliquis following NSTEMI and acute CVA 11-2015 She will see Dr Virgina Jock next in Aug. She saw Dr Ellyn Hack on 03-01-16., next visit 6 months and no changes to cardiac medications.    Patient seems generally to be tolerating off estrogen and on letrozole reasonably well. She has some hot flashes at hs, which have been tolerable. She has occasional HA which she feels are hormonal, last a few days ago and improve with prn tylenol. Appetite is increased, which she feels is hormonal; discussed watching calorie content of foods. She feels that energy is lower than usual, tho she and husband have been walking 1.5 miles almost daily. She denies bleeding, other neurologic symptoms, new or different abdominal or pelvic pain, increased SOB or chest pain. Chronic constipation is unchanged, mostly bowels move daily with present medications. She has slight puffiness right ankle which seems tender, but does not describe other arthralgias. No LE swelling. No problems with PAC. Remainder of 10 point Review of Systems negative/ unchanged.   Flu vaccine 09-12-15 PAC flushed 03-25-16 Genetics testing BreastOvarian panel by [facility] sent 06-26-15: normal CA 125 on 05-01-15 33  Family beach trip will be week of June 16  ONCOLOGIC HISTORY  Patient presented in late Jan 2014 with 2 weeks of spotting in early Dec 2015. Sonohystogram 01-05-13 showed uterus normal size and echotexture,  endometrium 4.3 mm, left ovary normal and right adnexa with 1.1 x 8.4.x8.2 cm cystic and solid mass. Endometrial biopsy benign and CA 125 also on 01-05-13 was 178.8. CT AP 01-17-13 with 1.0 x 6.9 x 8.9 cm complex right ovarian mass, no ascites, small retroperitoneal nodes. She was seen by Dr Skeet Latch on 01-18-13 and taken to surgery by Dr Josephina Shih on 02-13-13, which was TAH/BSO/ omentectomy/ureterolysis/ resection of cul-de-sac tumor/right pelvic lymphadnectomy and resection of rectum with reanastomosis. At completion of surgery there was no gross residual disease. 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 Washings [phone]) had rare clusters of atypical cells. Chemotherapy with dose dense taxol carboplatin was begun day 1 cycle 1 on 03-20-13; ANC was 1.1 on day 15 cycle 1 with taxol given and neupogen added 04-04-13. She had day 1 cycle 2 treatment on 04-17-13, then was briefly hospitalized 5-22 to 04-20-13 after syncopal episode, with antihypertensive agents DCd and UTI treated. She was feeling much better at time of "day 8" cycle 2 on 05-01-13 and did have neupogen 300 mcg x 1 dose on 05-02-13. She was readmitted to hospital 6-5 thru 05-05-13 with fever, empirically on antibiotics and blood cultures negative. Cycle 6 completed 08-14-13. CT AP 09-25-13 had no evidence of cancer and CA 125 was 18.3 on 07-24-2013. Marker was 20 in early Dec 2015, 26 on 01-31-15 and 35 on 03-14-15. PET 04-03-15 documented disease bilateral pelvic sidewalls, vaginal cuff and  scattered areas thru abdomen/ pelvis, as well as area of uptake in spleen. She resumed carboplatin taxol using q 3 week regimen on 05-01-15. She was neutropenic with ANC 0.3 on day 9 despite beginning granix on day 5. She  had 6 cycles of carbo taxol from 05-01-15 thru 08-25-15, with avastin cycles 2,3,4, 6. CT AP 09-10-15 showed resolution of involvement other than in spleen, which was improved. She saw Dr Josephina Shih on 09-12-15 , with discussion of splenectomy followed by avastin vs 3 additional cycles of chemo + avastin then reassess. Patient preferred additional treatment to splenectomy, with carbo taxol avastin resumed 09-29-15 and treated again 10-21-15. She tolerated treatment progressively more poorly, such that 1 planned additional cycle was held. CT CAP 11-06-15 and PET 11-18-15 showed persistent disease in area of splenic hilum (between stomach and spleen) and at 1 cm right pelvic sidewall node.  She had NSTEMI with marked HTN early Jan 2017, then CVA. She stopped chronic estrogen late Jan and began letrozole late March 2017.    Objective:  Vital signs in last 24 hours:  BP 124/54 mmHg  Pulse 63  Temp(Src) 97.5 F (36.4 C) (Oral)  Resp 18  Wt 133 lb 6.4 oz (60.51 kg)  SpO2 100% Weight is up 1.5 lbs Alert, oriented and appropriate. Ambulatory without assistance. Able to get on and off exam table with some effort. No alopecia  HEENT:PERRL, sclerae not icteric. Oral mucosa moist without lesions, posterior pharynx clear.  Lymphatics:no cervical,supraclavicular, axillary or inguinal adenopathy Resp: clear to auscultation bilaterally and normal percussion bilaterally Cardio: regular rate and rhythm. No gallop. GI: soft, nontender, mildly distended as is baseline, no mass or organomegaly. A few bowel sounds. Surgical incision not remarkable. Musculoskeletal/ Extremities: chronic arthritis changes distal joints. Right ankle without pitting edema or erythema, otherwise LE bilaterally without pitting edema, cords, tenderness Neuro: no change peripheral neuropathy. Otherwise nonfocal Skin without rash, ecchymosis, petechiae Portacath-without erythema or tenderness  Lab Results:  Results for orders  placed or performed in visit on 03/25/16  CBC with Differential  Result Value Ref Range   WBC 4.6 3.9 - 10.3 10e3/uL   NEUT# 2.3 1.5 - 6.5 10e3/uL   HGB 11.6 11.6 - 15.9 g/dL   HCT 33.5 (L) 34.8 - 46.6 %   Platelets 131 (L) 145 - 400 10e3/uL   MCV 101.5 (H) 79.5 - 101.0 fL   MCH 35.2 (H) 25.1 - 34.0 pg   MCHC 34.6 31.5 - 36.0 g/dL   RBC 3.30 (L) 3.70 - 5.45 10e6/uL   RDW 13.4 11.2 - 14.5 %   lymph# 1.6 0.9 - 3.3 10e3/uL   MONO# 0.6 0.1 - 0.9 10e3/uL   Eosinophils Absolute 0.1 0.0 - 0.5 10e3/uL   Basophils Absolute 0.0 0.0 - 0.1 10e3/uL   NEUT% 50.2 38.4 - 76.8 %   LYMPH% 35.6 14.0 - 49.7 %   MONO% 12.7 0.0 - 14.0 %   EOS% 1.1 0.0 - 7.0 %   BASO% 0.4 0.0 - 2.0 %   nRBC 0 0 - 0 %  Comprehensive metabolic panel  Result Value Ref Range   Sodium 136 136 - 145 mEq/L   Potassium 4.4 3.5 - 5.1 mEq/L   Chloride 103 98 - 109 mEq/L   CO2 24 22 - 29 mEq/L   Glucose 89 70 - 140 mg/dl   BUN 16.0 7.0 - 26.0 mg/dL   Creatinine 0.9 0.6 - 1.1 mg/dL   Total Bilirubin 0.62 0.20 - 1.20 mg/dL   Alkaline  Phosphatase 64 40 - 150 U/L   AST 19 5 - 34 U/L   ALT 20 0 - 55 U/L   Total Protein 6.6 6.4 - 8.3 g/dL   Albumin 4.0 3.5 - 5.0 g/dL   Calcium 9.6 8.4 - 10.4 mg/dL   Anion Gap 9 3 - 11 mEq/L   EGFR 68 (L) >90 ml/min/1.73 m2  CA 125  Result Value Ref Range   Cancer Antigen (CA) 125 14.5 0.0 - 38.1 U/mL    CA 125 had been 13.5 on 02-02-16, stable  Studies/Results:  Bone density scan report requested and received from [facility] 11-20-14 with normal bone density femoral neck, radius, LS; will be scanned into this EMR  Medications: I have reviewed the patient's current medications. Continue Letrozole. Occasional tylenol for HA is fine  DISCUSSION Reviewed rationale for stopping estrogen/ adding letrozole. I have explained that we can continue letrozole as long as it is helpful in controlling the ovarian cancer if she is tolerating it. She understands that likely the letrozole  will be easier overall to tolerate than chemotherapy, tho certainly there can be side effects including those related to hormonal changes. We have reviewed her very good bone density report from 10-2014, likely will have this repeated by Dr Virgina Jock [phone]. At completion of discussion she is in agreement with continuing the letrozole.   She understands that PAC needs to be flushed every 6--8 weeks when not otherwise used. We will try to coordinate visits with flushes as possible.     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 Adjuvant dose dense carboplatin taxol completed 08-14-2013. Progressive disease spring 2016, asymptomatic. Resumed chemotherapy with carboplatin and taxol on 05-01-15, addition of avastin with cycle 2. Restaging CT 08-2015 after 6 cycles improved, with residual in spleen. Additional 2 cycles carbo taxol avastin given 09-29-15 and 10-21-15, tolerated poorly including hypertension. CT CAP and PET 10-2015 residual disease in region of splenic hilum and 1 right pelvic node. Following complications of NSTEMI and CVA 11-2015, with very poor tolerance of chemotherapy and contraindication for additional avastin, treatment was held. Patient stopped long term estrogen late Jan 2017. Letrozole begun late 01-2016. I will see her back with PAC flush June. 2.NSTEMI with marked HTN on 12-03-15, then acute CVA ~ 12-07-15 either from cath or PAF. Now on Eliquis, cardiology and neurology involved  3.HTN: exacerbated by avastin: BP good today, last avastin 10-21-15 4.paroxysmal Afib: followed by cardiology, now on Eliquis 5.minimal thrombocytopenia possibly medication related. Still in reasonable range for anticoagulation 6.chronic constipation, required increased laxatives with chemo, up to date on colonoscopy.  7..hypothyroid on replacement by PCP. Diffuse uptake left lobe thyroid by scans,  per radiologist consistent with thyroiditis 8. Long estrogen replacement: Testing done on surgical path from 02-13-13 shows some positivity for ER PR (even tho was on same estrogen at that time). 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 Breast tissue heterogeneously dense by last tomo mammograms at [facility] 03-21-15. 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 11. GERD: better with protonix, continue. 12. flu vaccine 09-12-15 13.PAC in : flush at least every 6-8 weeks when not otherwise used. 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 Time spent 25 min including >50% counseling and coordination of care. Route Dr Virgina Jock.    [name],LENNIS P, MD   03/27/2016, 4:07 PM

## 2016-03-25 NOTE — Patient Instructions (Signed)

## 2016-03-26 ENCOUNTER — Telehealth: Payer: Self-pay

## 2016-03-26 LAB — CA 125: Cancer Antigen (CA) 125: 14.5 U/mL (ref 0.0–38.1)

## 2016-03-26 NOTE — Telephone Encounter (Signed)
-----   Message from Gordy Levan, MD sent at 03/26/2016  9:20 AM EDT ----- Labs seen and need follow up: please let her know CA 125 stable

## 2016-03-26 NOTE — Telephone Encounter (Signed)
Spoke with Mr. Gratton and told him the CA-125 result as noted below by Dr. Marko Plume. He will inform Ms. Bouley when she returns home later this evening.

## 2016-03-27 DIAGNOSIS — D696 Thrombocytopenia, unspecified: Secondary | ICD-10-CM | POA: Insufficient documentation

## 2016-03-27 DIAGNOSIS — Z7901 Long term (current) use of anticoagulants: Secondary | ICD-10-CM | POA: Insufficient documentation

## 2016-03-27 DIAGNOSIS — Z79899 Other long term (current) drug therapy: Secondary | ICD-10-CM | POA: Insufficient documentation

## 2016-04-06 ENCOUNTER — Ambulatory Visit
Admission: RE | Admit: 2016-04-06 | Discharge: 2016-04-06 | Disposition: A | Discharge status: Home / Self Care | Payer: Medicare Other | Source: Ambulatory Visit

## 2016-04-06 DIAGNOSIS — Z1231 Encounter for screening mammogram for malignant neoplasm of breast: Secondary | ICD-10-CM

## 2016-05-12 ENCOUNTER — Other Ambulatory Visit: Payer: Self-pay | Admitting: Oncology

## 2016-05-12 DIAGNOSIS — C561 Malignant neoplasm of right ovary: Secondary | ICD-10-CM

## 2016-05-12 DIAGNOSIS — C7989 Secondary malignant neoplasm of other specified sites: Secondary | ICD-10-CM

## 2016-05-12 DIAGNOSIS — C7889 Secondary malignant neoplasm of other digestive organs: Secondary | ICD-10-CM

## 2016-05-12 DIAGNOSIS — C563 Malignant neoplasm of bilateral ovaries: Secondary | ICD-10-CM

## 2016-05-12 DIAGNOSIS — C562 Malignant neoplasm of left ovary: Principal | ICD-10-CM

## 2016-05-13 ENCOUNTER — Ambulatory Visit (HOSPITAL_BASED_OUTPATIENT_CLINIC_OR_DEPARTMENT_OTHER): Payer: Medicare Other | Admitting: Oncology

## 2016-05-13 ENCOUNTER — Telehealth: Payer: Self-pay | Admitting: Oncology

## 2016-05-13 ENCOUNTER — Encounter: Payer: Self-pay | Admitting: Oncology

## 2016-05-13 ENCOUNTER — Telehealth: Payer: Self-pay | Admitting: *Deleted

## 2016-05-13 ENCOUNTER — Other Ambulatory Visit (HOSPITAL_BASED_OUTPATIENT_CLINIC_OR_DEPARTMENT_OTHER): Payer: Medicare Other

## 2016-05-13 ENCOUNTER — Ambulatory Visit (HOSPITAL_BASED_OUTPATIENT_CLINIC_OR_DEPARTMENT_OTHER): Payer: Medicare Other

## 2016-05-13 VITALS — BP 110/63 | HR 60 | Temp 98.3°F | Resp 18 | Ht 63.0 in | Wt 131.1 lb

## 2016-05-13 DIAGNOSIS — Z95828 Presence of other vascular implants and grafts: Secondary | ICD-10-CM

## 2016-05-13 DIAGNOSIS — C562 Malignant neoplasm of left ovary: Secondary | ICD-10-CM

## 2016-05-13 DIAGNOSIS — C7989 Secondary malignant neoplasm of other specified sites: Secondary | ICD-10-CM

## 2016-05-13 DIAGNOSIS — I480 Paroxysmal atrial fibrillation: Secondary | ICD-10-CM

## 2016-05-13 DIAGNOSIS — C5701 Malignant neoplasm of right fallopian tube: Secondary | ICD-10-CM

## 2016-05-13 DIAGNOSIS — C7889 Secondary malignant neoplasm of other digestive organs: Secondary | ICD-10-CM

## 2016-05-13 DIAGNOSIS — I10 Essential (primary) hypertension: Secondary | ICD-10-CM

## 2016-05-13 DIAGNOSIS — C569 Malignant neoplasm of unspecified ovary: Secondary | ICD-10-CM

## 2016-05-13 DIAGNOSIS — J069 Acute upper respiratory infection, unspecified: Secondary | ICD-10-CM

## 2016-05-13 DIAGNOSIS — C5702 Malignant neoplasm of left fallopian tube: Secondary | ICD-10-CM

## 2016-05-13 DIAGNOSIS — Z7901 Long term (current) use of anticoagulants: Secondary | ICD-10-CM

## 2016-05-13 DIAGNOSIS — G620 Drug-induced polyneuropathy: Secondary | ICD-10-CM

## 2016-05-13 DIAGNOSIS — D696 Thrombocytopenia, unspecified: Secondary | ICD-10-CM

## 2016-05-13 DIAGNOSIS — C561 Malignant neoplasm of right ovary: Secondary | ICD-10-CM | POA: Diagnosis not present

## 2016-05-13 DIAGNOSIS — C563 Malignant neoplasm of bilateral ovaries: Secondary | ICD-10-CM

## 2016-05-13 DIAGNOSIS — E039 Hypothyroidism, unspecified: Secondary | ICD-10-CM

## 2016-05-13 LAB — CBC WITH DIFFERENTIAL/PLATELET
BASO%: 0.2 % (ref 0.0–2.0)
BASOS ABS: 0 10*3/uL (ref 0.0–0.1)
EOS%: 0.4 % (ref 0.0–7.0)
Eosinophils Absolute: 0 10*3/uL (ref 0.0–0.5)
HEMATOCRIT: 34.7 % — AB (ref 34.8–46.6)
HEMOGLOBIN: 11.8 g/dL (ref 11.6–15.9)
LYMPH#: 1.5 10*3/uL (ref 0.9–3.3)
LYMPH%: 26.2 % (ref 14.0–49.7)
MCH: 34.5 pg — AB (ref 25.1–34.0)
MCHC: 34 g/dL (ref 31.5–36.0)
MCV: 101.5 fL — ABNORMAL HIGH (ref 79.5–101.0)
MONO#: 0.7 10*3/uL (ref 0.1–0.9)
MONO%: 12.1 % (ref 0.0–14.0)
NEUT#: 3.4 10*3/uL (ref 1.5–6.5)
NEUT%: 61.1 % (ref 38.4–76.8)
PLATELETS: 112 10*3/uL — AB (ref 145–400)
RBC: 3.42 10*6/uL — AB (ref 3.70–5.45)
RDW: 12.9 % (ref 11.2–14.5)
WBC: 5.5 10*3/uL (ref 3.9–10.3)

## 2016-05-13 LAB — COMPREHENSIVE METABOLIC PANEL
ALT: 19 U/L (ref 0–55)
ANION GAP: 7 meq/L (ref 3–11)
AST: 16 U/L (ref 5–34)
Albumin: 4 g/dL (ref 3.5–5.0)
Alkaline Phosphatase: 70 U/L (ref 40–150)
BUN: 21.1 mg/dL (ref 7.0–26.0)
CALCIUM: 9.7 mg/dL (ref 8.4–10.4)
CHLORIDE: 101 meq/L (ref 98–109)
CO2: 27 mEq/L (ref 22–29)
Creatinine: 0.9 mg/dL (ref 0.6–1.1)
EGFR: 64 mL/min/{1.73_m2} — ABNORMAL LOW (ref >90)
Glucose: 90 mg/dl (ref 70–140)
POTASSIUM: 4.7 meq/L (ref 3.5–5.1)
Sodium: 134 mEq/L — ABNORMAL LOW (ref 136–145)
Total Bilirubin: 0.95 mg/dL (ref 0.20–1.20)
Total Protein: 6.7 g/dL (ref 6.4–8.3)

## 2016-05-13 MED ORDER — HEPARIN SOD (PORK) LOCK FLUSH 100 UNIT/ML IV SOLN
500.0000 [IU] | Freq: Once | INTRAVENOUS | Status: AC | PRN
  Administered 2016-05-13: 500 [IU] via INTRAVENOUS
  Filled 2016-05-13: qty 5

## 2016-05-13 MED ORDER — SODIUM CHLORIDE 0.9 % IJ SOLN
10.0000 mL | INTRAMUSCULAR | Status: DC | PRN
  Administered 2016-05-13: 10 mL via INTRAVENOUS
  Filled 2016-05-13: qty 10

## 2016-05-13 MED ORDER — LETROZOLE 2.5 MG PO TABS: 2.5000 mg | ORAL_TABLET | Freq: Every day | ORAL | Status: DC

## 2016-05-13 NOTE — Telephone Encounter (Signed)
per pof to sch pt appt-gave pt opy of avs °

## 2016-05-13 NOTE — Progress Notes (Signed)
OFFICE PROGRESS NOTE   May 14, 2016   Physicians: D. ClarkePearson; J.Russo, T.Fontaine, Glenetta Hew (cardiology), [name], D.Jacobs, Zollie Beckers, P.Sethi, J.Moody, D.Shoemaker  INTERVAL HISTORY:  Patient is seen, alone for visit, in continuing attention to recurrent ovarian carcinoma, now on letrozole since March 2017, which she is tolerating reasonably well. Note she also DCd very long term estrogen in 11-2015. Last imaging was CT AP 02-09-16 and PET 10-2015. She is on Eliquis following NSTEMI and acute CVA 11-2015.  Patient has symptoms of viral URI for past 24 hours, with clear rhinorrhea, no fever. She has otherwise felt generally well, going about all usual activities including grandchild's high school graduation this week and planning family beach trip next week. She complains of some fatigue, which she feels is due to stopping estrogen. Chronic constipation is unchanged, has to increase miralax and stool softeners occasionally.  Otherwise no new or different GI symptoms or abdominal/ pelvic discomfort. Appetite is good. No chest pain or SOB. No bleeding or unusual bruising. She notices more hot flashes, tho these seem tolerable. She walks for exercise regularly outdoors. No new neurologic symptoms. No problems with PAC. She is not complaining of arthralgias. Remainder of 10 point Review of Systems negative.    PAC flushed 05-13-16 Genetics testing BreastOvarian panel by [facility] sent 06-26-15: normal CA 125 on 05-01-15 33 Normal bone density 10-2014  ONCOLOGIC HISTORY Patient presented in late Jan 2014 with 2 weeks of spotting in early Dec 2015. Sonohystogram 01-05-13 showed uterus normal size and echotexture, endometrium 4.3 mm, left ovary normal and right adnexa with 1.1 x 8.4.x8.2 cm cystic and solid mass. Endometrial biopsy benign and CA 125 also on 01-05-13 was 178.8. CT AP 01-17-13 with 1.0 x 6.9 x 8.9 cm complex right ovarian mass, no ascites, small retroperitoneal nodes. She was  seen by Dr Skeet Latch on 01-18-13 and taken to surgery by Dr Josephina Shih on 02-13-13, which was TAH/BSO/ omentectomy/ureterolysis/ resection of cul-de-sac tumor/right pelvic lymphadnectomy and resection of rectum with reanastomosis. At completion of surgery there was no gross residual disease. 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 Washings [phone]) had rare clusters of atypical cells. Chemotherapy with dose dense taxol carboplatin was begun day 1 cycle 1 on 03-20-13; ANC was 1.1 on day 15 cycle 1 with taxol given and neupogen added 04-04-13. She had day 1 cycle 2 treatment on 04-17-13, then was briefly hospitalized 5-22 to 04-20-13 after syncopal episode, with antihypertensive agents DCd and UTI treated. She was feeling much better at time of "day 8" cycle 2 on 05-01-13 and did have neupogen 300 mcg x 1 dose on 05-02-13. She was readmitted to hospital 6-5 thru 05-05-13 with fever, empirically on antibiotics and blood cultures negative. Cycle 6 completed 08-14-13. CT AP 09-25-13 had no evidence of cancer and CA 125 was 18.3 on 07-24-2013. Marker was 20 in early Dec 2015, 26 on 01-31-15 and 35 on 03-14-15. PET 04-03-15 documented disease bilateral pelvic sidewalls, vaginal cuff and scattered areas thru abdomen/ pelvis, as well as area of uptake in spleen. She resumed carboplatin taxol using q 3 week regimen on 05-01-15. She was neutropenic with ANC 0.3 on day 9 despite beginning granix on day 5. She had 6 cycles of carbo taxol from 05-01-15 thru 08-25-15, with avastin cycles 2,3,4, 6. CT AP  09-10-15 showed resolution of involvement other than in spleen, which was improved. She saw Dr Josephina Shih on 09-12-15 , with discussion of splenectomy followed by avastin vs 3 additional  cycles of chemo + avastin then reassess. Patient preferred additional treatment to splenectomy, with carbo taxol avastin resumed 09-29-15 and treated again 10-21-15. She tolerated treatment progressively more poorly, such that 1 planned additional cycle was held. CT CAP 11-06-15 and PET 11-18-15 showed persistent disease in area of splenic hilum (between stomach and spleen) and at 1 cm right pelvic sidewall node.  She had NSTEMI with marked HTN early Jan 2017, then CVA. She stopped chronic estrogen late Jan and began letrozole late March 2017.   Objective:  Vital signs in last 24 hours:  BP 110/63 mmHg  Pulse 60  Temp(Src) 98.3 F (36.8 C) (Oral)  Resp 18  Ht 5' 3"  (1.6 m)  Wt 131 lb 1.6 oz (59.467 kg)  BMI 23.23 kg/m2  SpO2 100% Weight down 1 lb. Sounds nasally congested but does not look acutely ill.  Alert, oriented and appropriate. Ambulatory without difficulty, able to get on and off exam table No alopecia  HEENT:PERRL, sclerae not icteric. Oral mucosa moist without lesions, posterior pharynx minimal erythema without exudate. Clear rhinorrhea.  Neck supple. No JVD.  Lymphatics:no cervical,supraclavicular, axillary or inguinal adenopathy Resp: clear to auscultation bilaterally and normal percussion bilaterally Cardio: regular rate and rhythm. No gallop. GI: soft, nontender, not distended, no mass or organomegaly. Normally active bowel sounds. Surgical incision not remarkable. Musculoskeletal/ Extremities: without pitting edema, cords, tenderness Neuro:speech fluent,  no change peripheral neuropathy in feet. Otherwise nonfocal. PSYCH appropriate mood and affect Skin facial flushing during visit with hot flash. Otherwise without rash, ecchymosis. Slight petechiae ankles bilaterally Breasts: without dominant mass, skin or nipple findings. Axillae benign. Portacath-without erythema or tenderness  Lab Results:  Results for orders placed or performed in visit on 05/13/16  CBC with  Differential  Result Value Ref Range   WBC 5.5 3.9 - 10.3 10e3/uL   NEUT# 3.4 1.5 - 6.5 10e3/uL   HGB 11.8 11.6 - 15.9 g/dL   HCT 34.7 (L) 34.8 - 46.6 %   Platelets 112 (L) 145 - 400 10e3/uL   MCV 101.5 (H) 79.5 - 101.0 fL   MCH 34.5 (H) 25.1 - 34.0 pg   MCHC 34.0 31.5 - 36.0 g/dL   RBC 3.42 (L) 3.70 - 5.45 10e6/uL   RDW 12.9 11.2 - 14.5 %   lymph# 1.5 0.9 - 3.3 10e3/uL   MONO# 0.7 0.1 - 0.9 10e3/uL   Eosinophils Absolute 0.0 0.0 - 0.5 10e3/uL   Basophils Absolute 0.0 0.0 - 0.1 10e3/uL   NEUT% 61.1 38.4 - 76.8 %   LYMPH% 26.2 14.0 - 49.7 %   MONO% 12.1 0.0 - 14.0 %   EOS% 0.4 0.0 - 7.0 %   BASO% 0.2 0.0 - 2.0 %  Comprehensive metabolic panel  Result Value Ref Range   Sodium 134 (L) 136 - 145 mEq/L   Potassium 4.7 3.5 - 5.1 mEq/L   Chloride 101 98 - 109 mEq/L   CO2 27 22 - 29 mEq/L   Glucose 90 70 - 140 mg/dl   BUN 21.1 7.0 - 26.0 mg/dL   Creatinine 0.9 0.6 - 1.1 mg/dL   Total Bilirubin 0.95 0.20 - 1.20 mg/dL   Alkaline Phosphatase 70 40 - 150 U/L   AST 16 5 - 34 U/L   ALT 19 0 - 55 U/L   Total Protein 6.7  6.4 - 8.3 g/dL   Albumin 4.0 3.5 - 5.0 g/dL   Calcium 9.7 8.4 - 10.4 mg/dL   Anion Gap 7 3 - 11 mEq/L   EGFR 64 (L) >90 ml/min/1.73 m2  CA 125  Result Value Ref Range   Cancer Antigen (CA) 125 13.2 0.0 - 38.1 U/mL    Platelets slightly lower today than previously, tho have been at least 120 in past. May be due to viral URI  Will let her know CA 125 stable today  Studies[facility]D DIGITAL SCREENING BILATERAL MAMMOGRAM WITH CAD AND ADJUNCT TOMO  COMPARISON: Previous exam(s).  ACR Breast Density Category b: There are scattered areas of fibroglandular density.  FINDINGS: There are no findings suspicious for malignancy. Images were processed with CAD.  IMPRESSION: No mammographic evidence of malignancy. A result letter of this screening mammogram will be mailed directly to the patient.  RECOMMENDATION: Screening mammogram in  one year. (Code:SM-B-01Y)  BI-RADS CATEGORY 1: Negative.  Medications: I have reviewed the patient's current medications.  DISCUSSION Cautioned patient that she should notify physician if bleeding or bruising. Discussed slightly lower platelets which may be with viral URI. Encouraged her to maintain good hydration with hot flashes in summertime  She is in agreement with continuing letrozole and staying off estrogen now.     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 Adjuvant dose dense carboplatin taxol completed 08-14-2013. Progressive disease spring 2016, asymptomatic. Resumed chemotherapy with carboplatin and taxol on 05-01-15, addition of avastin with cycle 2. Restaging CT 08-2015 after 6 cycles improved, with residual in spleen. Additional 2 cycles carbo taxol avastin given 09-29-15 and 10-21-15, tolerated poorly including hypertension. CT CAP and PET 10-2015 residual disease in region of splenic hilum and 1 right pelvic node. Following complications of NSTEMI and CVA 11-2015, with very poor tolerance of chemotherapy and contraindication for additional avastin, treatment was held. Patient stopped long term estrogen late Jan 2017. Letrozole begun late 01-2016. Will coordinate next visit in July with Dr Josephina Shih, I have not ordered another CT yet. 2.NSTEMI with marked HTN on 12-03-15, then acute CVA ~ 12-07-15 either from cath or PAF. Now on Eliquis, cardiology and neurology involved  3.HTN: exacerbated by avastin: BP good today, last avastin 10-21-15 4.paroxysmal Afib: followed by cardiology,  on Eliquis 5.minimal thrombocytopenia possibly medication related. Platelets a little lower today possibly with viral URI, will recheck with PAC flush in July. Note on Eliquis 6.chronic constipation,  up to date on colonoscopy.  7..hypothyroid on replacement by PCP. Diffuse uptake  left lobe thyroid by scans, per radiologist consistent with thyroiditis 8. Long estrogen replacement: Testing done on surgical path from 02-13-13 shows some positivity for ER PR (even tho was on same estrogen at that time). Patient DCd estrogen in late Jan 2017, some fairly minimal hot flashes off estrogen prior to start of letrozole.  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 Up to date mammograms at [facility] 04-06-16, scattered fibroglandular density noted. 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 11. GERD: better with protonix, continue. 12. Normal bone density by scan at Dr [facility] office [phone]. Should have this repeated ~ 10-2016 particularly if still on letrozole then 13.PAC in : flush at least every 6-8 weeks when not otherwise used. 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 Time spent 25 min including >50% counseling and coordination of care. Route Dr Esaw Dace, MD   05/14/2016, 3:35 PM

## 2016-05-13 NOTE — Patient Instructions (Signed)

## 2016-05-13 NOTE — Telephone Encounter (Signed)
TC from [facility] requesting verbal ok for refills on pt's Letrozole. Done x 2 refills. Pt has appt today with MD

## 2016-05-14 ENCOUNTER — Other Ambulatory Visit: Payer: Self-pay | Admitting: Internal Medicine

## 2016-05-14 DIAGNOSIS — E041 Nontoxic single thyroid nodule: Secondary | ICD-10-CM

## 2016-05-14 LAB — CA 125: Cancer Antigen (CA) 125: 13.2 U/mL (ref 0.0–38.1)

## 2016-05-21 ENCOUNTER — Telehealth: Payer: Self-pay | Admitting: *Deleted

## 2016-05-21 NOTE — Telephone Encounter (Signed)
TC from patient requesting CA 125 results from 05/13/16. Shared results with patient. No further needs identified.

## 2016-05-26 ENCOUNTER — Other Ambulatory Visit: Payer: Medicare Other

## 2016-05-26 ENCOUNTER — Ambulatory Visit
Admission: RE | Admit: 2016-05-26 | Discharge: 2016-05-26 | Disposition: A | Discharge status: Home / Self Care | Payer: Medicare Other | Source: Ambulatory Visit | Attending: Internal Medicine | Admitting: Internal Medicine

## 2016-05-26 DIAGNOSIS — E041 Nontoxic single thyroid nodule: Secondary | ICD-10-CM

## 2016-06-04 ENCOUNTER — Emergency Department (HOSPITAL_COMMUNITY): Payer: Medicare Other

## 2016-06-04 ENCOUNTER — Emergency Department (HOSPITAL_COMMUNITY)
Admission: EM | Admit: 2016-06-04 | Discharge: 2016-06-04 | Disposition: A | Discharge status: Home / Self Care | Payer: Medicare Other | Attending: Emergency Medicine | Admitting: Emergency Medicine

## 2016-06-04 DIAGNOSIS — I10 Essential (primary) hypertension: Secondary | ICD-10-CM | POA: Insufficient documentation

## 2016-06-04 DIAGNOSIS — Z79899 Other long term (current) drug therapy: Secondary | ICD-10-CM | POA: Insufficient documentation

## 2016-06-04 DIAGNOSIS — R0781 Pleurodynia: Secondary | ICD-10-CM | POA: Insufficient documentation

## 2016-06-04 DIAGNOSIS — Z8543 Personal history of malignant neoplasm of ovary: Secondary | ICD-10-CM | POA: Diagnosis not present

## 2016-06-04 DIAGNOSIS — Z96641 Presence of right artificial hip joint: Secondary | ICD-10-CM | POA: Diagnosis not present

## 2016-06-04 DIAGNOSIS — Z96652 Presence of left artificial knee joint: Secondary | ICD-10-CM | POA: Diagnosis not present

## 2016-06-04 DIAGNOSIS — Z85828 Personal history of other malignant neoplasm of skin: Secondary | ICD-10-CM | POA: Diagnosis not present

## 2016-06-04 DIAGNOSIS — I252 Old myocardial infarction: Secondary | ICD-10-CM | POA: Insufficient documentation

## 2016-06-04 DIAGNOSIS — R079 Chest pain, unspecified: Secondary | ICD-10-CM | POA: Diagnosis present

## 2016-06-04 DIAGNOSIS — M25511 Pain in right shoulder: Secondary | ICD-10-CM | POA: Insufficient documentation

## 2016-06-04 LAB — I-STAT TROPONIN, ED: Troponin i, poc: 0 ng/mL (ref 0.00–0.08)

## 2016-06-04 LAB — CBC
HEMATOCRIT: 35 % — AB (ref 36.0–46.0)
Hemoglobin: 11.9 g/dL — ABNORMAL LOW (ref 12.0–15.0)
MCH: 34.6 pg — AB (ref 26.0–34.0)
MCHC: 34 g/dL (ref 30.0–36.0)
MCV: 101.7 fL — AB (ref 78.0–100.0)
Platelets: 124 10*3/uL — ABNORMAL LOW (ref 150–400)
RBC: 3.44 MIL/uL — ABNORMAL LOW (ref 3.87–5.11)
RDW: 12.9 % (ref 11.5–15.5)
WBC: 5.8 10*3/uL (ref 4.0–10.5)

## 2016-06-04 LAB — BASIC METABOLIC PANEL
Anion gap: 7 (ref 5–15)
BUN: 16 mg/dL (ref 6–20)
CHLORIDE: 103 mmol/L (ref 101–111)
CO2: 26 mmol/L (ref 22–32)
Calcium: 9.6 mg/dL (ref 8.9–10.3)
Creatinine, Ser: 0.87 mg/dL (ref 0.44–1.00)
GFR calc Af Amer: >60 mL/min (ref >60)
GFR calc non Af Amer: >60 mL/min (ref >60)
GLUCOSE: 103 mg/dL — AB (ref 65–99)
POTASSIUM: 4.1 mmol/L (ref 3.5–5.1)
Sodium: 136 mmol/L (ref 135–145)

## 2016-06-04 LAB — D-DIMER, QUANTITATIVE: D-Dimer, Quant: 0.69 ug/mL-FEU — ABNORMAL HIGH (ref 0.00–0.50)

## 2016-06-04 MED ORDER — IOPAMIDOL (ISOVUE-370) INJECTION 76%
INTRAVENOUS | Status: AC
  Administered 2016-06-04: 100 mL
  Filled 2016-06-04: qty 100

## 2016-06-04 MED ORDER — METHOCARBAMOL 500 MG PO TABS
500.0000 mg | ORAL_TABLET | Freq: Two times a day (BID) | ORAL | Status: DC
Start: 2016-06-04 — End: 2016-07-19

## 2016-06-04 MED ORDER — HEPARIN SOD (PORK) LOCK FLUSH 100 UNIT/ML IV SOLN
500.0000 [IU] | Freq: Once | INTRAVENOUS | Status: AC
  Administered 2016-06-04: 500 [IU]
  Filled 2016-06-04: qty 5

## 2016-06-04 NOTE — ED Notes (Signed)
Pt has shoulder pain and chest pain starting last night. Pt c/o of "not being able to get a deep breath". Denies N/V and sweating

## 2016-06-04 NOTE — Discharge Instructions (Signed)
Take robaxin as prescribed as needed for muscle spasms. Try heating pads. Follow up with primary care doctor. Return if worsening.   Muscle Pain, Adult Muscle pain (myalgia) may be caused by many things, including:  Overuse or muscle strain, especially if you are not in shape. This is the most common cause of muscle pain.  Injury.  Bruises.  Viruses, such as the flu.  Infectious diseases.  Fibromyalgia, which is a chronic condition that causes muscle tenderness, fatigue, and headache.  Autoimmune diseases, including lupus.  Certain drugs, including ACE inhibitors and statins. Muscle pain may be mild or severe. In most cases, the pain lasts only a short time and goes away without treatment. To diagnose the cause of your muscle pain, your health care provider will take your medical history. This means he or she will ask you when your muscle pain began and what has been happening. If you have not had muscle pain for very long, your health care provider may want to wait before doing much testing. If your muscle pain has lasted a long time, your health care provider may want to run tests right away. If your health care provider thinks your muscle pain may be caused by illness, you may need to have additional tests to rule out certain conditions.  Treatment for muscle pain depends on the cause. Home care is often enough to relieve muscle pain. Your health care provider may also prescribe anti-inflammatory medicine. HOME CARE INSTRUCTIONS Watch your condition for any changes. The following actions may help to lessen any discomfort you are feeling:  Only take over-the-counter or prescription medicines as directed by your health care provider.  Apply ice to the sore muscle:  Put ice in a plastic bag.  Place a towel between your skin and the bag.  Leave the ice on for 15-20 minutes, 3-4 times a day.  You may alternate applying hot and cold packs to the muscle as directed by your health care  provider.  If overuse is causing your muscle pain, slow down your activities until the pain goes away.  Remember that it is normal to feel some muscle pain after starting a workout program. Muscles that have not been used often will be sore at first.  Do regular, gentle exercises if you are not usually active.  Warm up before exercising to lower your risk of muscle pain.  Do not continue working out if the pain is very bad. Bad pain could mean you have injured a muscle. SEEK MEDICAL CARE IF:  Your muscle pain gets worse, and medicines do not help.  You have muscle pain that lasts longer than 3 days.  You have a rash or fever along with muscle pain.  You have muscle pain after a tick bite.  You have muscle pain while working out, even though you are in good physical condition.  You have redness, soreness, or swelling along with muscle pain.  You have muscle pain after starting a new medicine or changing the dose of a medicine. SEEK IMMEDIATE MEDICAL CARE IF:  You have trouble breathing.  You have trouble swallowing.  You have muscle pain along with a stiff neck, fever, and vomiting.  You have severe muscle weakness or cannot move part of your body. MAKE SURE YOU:   Understand these instructions.  Will watch your condition.  Will get help right away if you are not doing well or get worse.   This information is not intended to replace advice given to  you by your health care provider. Make sure you discuss any questions you have with your health care provider.   Document Released: 10/07/2006 Document Revised: 12/06/2014 Document Reviewed: 09/11/2013 Elsevier Interactive Patient Education [employer].

## 2016-06-04 NOTE — ED Provider Notes (Signed)
CSN: [id number]     Arrival date & time 06/04/16  0603 History   First MD Initiated Contact with Patient 06/04/16 [phone]     Chief Complaint  Patient presents with  . Shoulder Pain  . Chest Pain     (Consider location/radiation/quality/duration/timing/severity/associated sxs/prior Treatment) HPI [name] is a 75 y.o. female with hx of HTN, afib, NSTEMI, CVA, Ovarian cancer, presents to emergency department complaining of chest pain. Patient states her pain started last night. She initially developed pain in the right upper back/shoulder. She states several hours later she noticed that she had pain in the right chest that is worse with deep breathing. Patient states "I just can't take in a deep breath." Patient is on blood thinners, Eliquis. Denies any recent travel or surgeries. Denies pain or swelling to her legs. Patient is not on chemotherapy at this time, she is on letrozole. She denies any shortness of breath. She denies any cough. No nausea, vomiting, diarrhea. Denies any other complaints at this time.  Past Medical History  Diagnosis Date  . Hypertension   . Dyslipidemia   . Hypothyroidism   . Arthritis     OSTEOARTHRITIS   -- CONSTANT PAIN RIGHT HIP---AND PAIN LEFT KNEE--PT STATES SHE GETS INJECTIONS INTO HER KNEE  . PAF (paroxysmal atrial fibrillation) ([place])     Only on Plavix -- not full Anticoagulation per pt. request; On Amiodarone (Eye Exam 12/25/15)  . Complication of anesthesia     BLOOD PRESSURE DROPPED WITH NASAL SURGERY, ONE OF THE CARPAL TUNNEL REPAIRS AND DURING A COLONOSCOPY  . History of skin cancer   . Ovarian cancer ([facility]) 01/2013    Recurrence since 2014/2016  . NSTEMI (non-ST elevated myocardial infarction) ([place]) 12/09/15    Medical management: Distal branch of D1 95%, ostial D2 75%, distal LAD 50%. Tortuous arteries consistent with hypertension  . Menopausal symptoms    Past Surgical History  Procedure Laterality Date  . Bilateral carpal tunnel repair   2007  . Dilation and curettage of uterus  1969  . Surgery for ruptured ovarian cyst  1969  . Rhinoplasty for fractured nose  1986  . Left knee arthroscopy   2011  . Total hip arthroplasty  02/25/2012    Procedure: TOTAL HIP ARTHROPLASTY ANTERIOR APPROACH;  Surgeon: Mcarthur Rossetti, MD;  Location: [facility];  Service: Orthopedics;  Laterality: Right;  . Tonsillectomy  1962  . Laparotomy Bilateral 02/13/2013    Procedure: EXPLORATORY LAPAROTOMY TOTAL ABDOMINAL HYSTERECTOMY BILATERAL SALPINGO-OOPHORECTOMY, Partial Rectal Resection with Reanastamosis;  Surgeon: Alvino Chapel, MD;  Location: [facility];  Service: Gynecology;  Laterality: Bilateral;  . Omentectomy  02/13/2013    Procedure: OMENTECTOMY;  Surgeon: Alvino Chapel, MD;  Location: [facility];  Service: Gynecology;;  . Lymphadenectomy Right 02/13/2013    Procedure: PEVLIC  LYMPHADENECTOMY, DEBULKING right pelvic tumor nodules;  Surgeon: Alvino Chapel, MD;  Location: [facility];  Service: Gynecology;  Laterality: Right;  . Tah/bso/tumor debulking with right pelvic lnd  01/2013  . Transthoracic echocardiogram  May 2014; January 2016    a. Normal LV size function. EF 60-65%. Grade 1 diastolic function. Mild MR and mildly elevated PA pressures of 37 mmHg per;; b. EF 60-65%. Mobile echodensity 10 mm x 6 mm attest interventricular septum is questionable fibroblastoma.  . Nm myoview ltd  June 2010     subbmaximal with no ischemia or infarction.  . Total knee arthroplasty Left 01/03/2015    Procedure: LEFT TOTAL KNEE ARTHROPLASTY;  Surgeon: Mcarthur Rossetti, MD;  Location: [facility];  Service: Orthopedics;  Laterality: Left;  . Cardiac catheterization N/A 12/05/2015    Procedure: Left Heart Cath and Coronary Angiography;  Surgeon: Belva Crome, MD;  Location: [facility];  Service: Cardiovascular;  distal branch of D1 95%, ostial D2 75%, dLAD 50%,p-mRCA 40%. Tortuous vessels.   Family History  Problem Relation Age of  Onset  . Hypertension Mother   . Basal cell carcinoma Mother   . AAA (abdominal aortic aneurysm) Father   . Heart Problems Maternal Uncle   . Heart Problems Maternal Grandfather   . AAA (abdominal aortic aneurysm) Paternal Grandmother   . Prostate cancer Maternal Uncle 70  . Prostate cancer Other   . Other Daughter     one daughter had TAH-BSO at 59; other daughter will have one soon   Social History  Substance Use Topics  . Smoking status: Never Smoker   . Smokeless tobacco: Never Used  . Alcohol Use: No   OB History    Gravida Para Term Preterm AB TAB SAB Ectopic Multiple Living   3 2   1  1  1 3      Review of Systems  Constitutional: Negative for fever and chills.  Respiratory: Negative for cough, chest tightness and shortness of breath.   Cardiovascular: Positive for chest pain. Negative for palpitations and leg swelling.  Gastrointestinal: Negative for nausea, vomiting, abdominal pain and diarrhea.  Genitourinary: Negative for dysuria and flank pain.  Musculoskeletal: Positive for myalgias and arthralgias. Negative for neck pain and neck stiffness.  Skin: Negative for rash.  Neurological: Negative for dizziness, weakness and headaches.  All other systems reviewed and are negative.     Allergies  Coreg; Ceftin; Clonidine derivatives; Codeine; Flexeril; Levaquin; Lipitor; Lisinopril; Pravastatin; Tegaderm ag mesh; Tape; and Ultram  Home Medications   Prior to Admission medications   Medication Sig Start Date End Date Taking? Authorizing Provider  amiodarone (PACERONE) 200 MG tablet Take 0.5 tablets (100 mg total) by mouth daily. 12/16/15   Leonie Man, MD  amLODipine (NORVASC) 5 MG tablet Take 5 mg by mouth daily. 04/08/16   Historical Provider, MD  amoxicillin (AMOXIL) 500 MG capsule Reported on 05/13/2016 02/26/15   Historical Provider, MD  apixaban (ELIQUIS) 5 MG TABS tablet Take 1 tablet (5 mg total) by mouth 2 (two) times daily. 01/01/16   Leonie Man, MD   bisacodyl (DULCOLAX) 5 MG EC tablet Take 5 mg by mouth daily as needed for moderate constipation. Reported on 12/25/2015    Historical Provider, MD  desonide (DESOWEN) 0.05 % cream Apply 1 application topically 2 (two) times daily.  12/23/14   Historical Provider, MD  diphenhydrAMINE (SOMINEX) 25 MG tablet Take 25-50 mg by mouth at bedtime as needed for sleep. Reported on 12/25/2015    Historical Provider, MD  docusate sodium (COLACE) 100 MG capsule Take 100 mg by mouth at bedtime. And prn    Historical Provider, MD  ezetimibe (ZETIA) 10 MG tablet Take 1 tablet (10 mg total) by mouth daily. 02/24/16   Leonie Man, MD  famotidine (PEPCID) 10 MG tablet Take 10 mg by mouth daily. Takes every other day    Historical Provider, MD  Glucosamine HCl (GLUCOSAMINE PO) Take 1,000 mg by mouth every morning.     Historical Provider, MD  ipratropium (ATROVENT) 0.06 % nasal spray Place 2 sprays into the nose 3 (three) times daily as needed. 2 sprays each nostril 02/16/16  Historical Provider, MD  irbesartan (AVAPRO) 300 MG tablet TAKE 1 TABLET (300 MG TOTAL) BY MOUTH DAILY. 01/06/16   Leonie Man, MD  letrozole [facility]) 2.5 MG tablet Take 1 tablet (2.5 mg total) by mouth daily. 05/13/16   Lennis Marion Downer, MD  levothyroxine (SYNTHROID) 88 MCG tablet Take 88 mcg by mouth daily before breakfast.    Historical Provider, MD  lidocaine-prilocaine (EMLA) cream Apply to Porta-Cath site 1-2 hours prior to access as directed. 04/22/15   Lennis Marion Downer, MD  loratadine (CLARITIN) 10 MG tablet Take 10 mg by mouth daily as needed for allergies. Reported on 12/25/2015    Historical Provider, MD  magnesium hydroxide (MILK OF MAGNESIA) 800 MG/5ML suspension Take 30 mLs by mouth daily as needed for constipation. Reported on 11/20/2015    Historical Provider, MD  Melatonin 5 MG CAPS Take 1 capsule by mouth at bedtime as needed (sleep). Reported on 12/25/2015    Historical Provider, MD  Menthol, Topical Analgesic, (ICY HOT EX) Apply  1 application topically daily as needed (pain.).    Historical Provider, MD  metoprolol (LOPRESSOR) 50 MG tablet Take 25 mg by mouth 2 (two) times daily.    Historical Provider, MD  Multiple Vitamin (MULTIVITAMIN WITH MINERALS) TABS tablet Take 1 tablet by mouth every morning.     Historical Provider, MD  ondansetron (ZOFRAN) 8 MG tablet Take 1 tablet (8 mg total) by mouth every 8 (eight) hours as needed for nausea or vomiting (Will not make drowsy). Patient not taking: Reported on 05/13/2016 07/11/15   Gordy Levan, MD  Polyethyl Glycol-Propyl Glycol (SYSTANE OP) Place 1 drop into both eyes 2 (two) times daily.     Historical Provider, MD  polyethylene glycol (MIRALAX / GLYCOLAX) packet Take 17 g by mouth every morning.     Historical Provider, MD  potassium chloride (K-DUR) 10 MEQ tablet Take 10 mEq by mouth every Monday, Wednesday, and Friday. 05/05/13   Shon Baton, MD  spironolactone (ALDACTONE) 25 MG tablet Take 25 mg by mouth daily.  11/20/15   Historical Provider, MD   BP 149/77 mmHg  Pulse 69  Temp(Src) 98 F (36.7 C)  Resp 24  SpO2 100% Physical Exam  Constitutional: She is oriented to person, place, and time. She appears well-developed and well-nourished. No distress.  HENT:  Head: Normocephalic.  Eyes: Conjunctivae are normal.  Neck: Neck supple.  Cardiovascular: Normal rate, regular rhythm and normal heart sounds.   Pulmonary/Chest: Effort normal and breath sounds normal. No respiratory distress. She has no wheezes. She has no rales.  No chest wall tenderness. Port in place in right upper chest, appears normal with no evidence of surrounding infection  Abdominal: Soft. Bowel sounds are normal. She exhibits no distension. There is no tenderness. There is no rebound.  Musculoskeletal: She exhibits no edema.  Tenderness to palpation over right trapezius. Pain with ROM of the right shoulder.   Neurological: She is alert and oriented to person, place, and time.  Skin: Skin is warm  and dry.  Psychiatric: She has a normal mood and affect. Her behavior is normal.  Nursing note and vitals reviewed.   ED Course  Procedures (including critical care time) Labs Review Labs Reviewed  BASIC METABOLIC PANEL - Abnormal; Notable for the following:    Glucose, Bld 103 (*)    All other components within normal limits  CBC - Abnormal; Notable for the following:    RBC 3.44 (*)    Hemoglobin 11.9 (*)  HCT 35.0 (*)    MCV 101.7 (*)    MCH 34.6 (*)    Platelets 124 (*)    All other components within normal limits  D-DIMER, QUANTITATIVE (NOT AT [facility])  Randolm Idol, ED    Imaging Review Dg Chest 2 View  06/04/2016  CLINICAL DATA:  Acute onset of generalized chest and shoulder pain. Initial encounter. EXAM: CHEST  2 VIEW COMPARISON:  Chest radiograph from 12/06/2015 FINDINGS: The lungs are well-aerated and clear. There is no evidence of focal opacification, pleural effusion or pneumothorax. The heart is normal in size; the mediastinal contour is within normal limits. No acute osseous abnormalities are seen. 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 A right-sided chest port is noted ending about the distal SVC. IMPRESSION: 1. No acute cardiopulmonary process seen. 2. 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 Electronically Signed   By: Garald Balding M.D.   On: 06/04/2016 06:53   I have personally reviewed and evaluated these images and lab results as part of my medical decision-making.   EKG Interpretation   Date/Time:  Friday June 04 2016 06:09:41 EDT Ventricular Rate:  72 PR Interval:    QRS Duration: 95 QT Interval:  402 QTC Calculation: 440 R Axis:   68 Text Interpretation:  Sinus rhythm Borderline prolonged PR interval  Baseline wander in lead(s) V6 Confirmed by HORTON  MD, COURTNEY ([id number]) on  06/04/2016 6:17:07 AM Also confirmed by Dina Rich  MD, COURTNEY ([id number]), editor  Gilford Rile,  CCT, [place] ([id number])  on 06/04/2016 6:58:37 AM      MDM   Final diagnoses:  Right shoulder pain   Patient with right sided pleuritic chest pain that started yesterday. Her vital signs are normal. She does have history of ovarian cancer, A. fib, and STEMI, CVA. Will check labs including troponin, d-dimer, chest x-ray. Pt does not want anything for pain  10:33 AM Ddimer came back elevated. CT angio was obtained and came back with no acute findings. Pt is in NAD. Pain reproducible with movement and palpation. No elevated in WBC. Atypical for ACS, negative trop, pain since yesterday.  Most likely musculoskeletal. Home with robaxin and follow up with PCP. Return precautions discussed.   Filed Vitals:   06/04/16 0609 06/04/16 0615 06/04/16 0700  BP: 149/77    Pulse: 74 69 69  Temp: 98 F (36.7 C)    Resp:  13 24  SpO2: 99% 100% 100%     Jeannett Senior, PA-C 06/04/16 1036  Dorie Rank, MD 06/04/16 1038

## 2016-06-04 NOTE — ED Notes (Signed)
Patient husband was given a cup of water.

## 2016-06-10 ENCOUNTER — Other Ambulatory Visit: Payer: Self-pay | Admitting: Oncology

## 2016-06-10 ENCOUNTER — Telehealth: Payer: Self-pay

## 2016-06-10 NOTE — Telephone Encounter (Signed)
Pt called stating that last Friday she went to ER for R shoulder, R neck and chest pain. ED workup was neg for MI, neg for PE. They dx muscluloskeletal origin. They sent her home with robaxin. She was feeling better until last night. She is having chest pain similar to Friday. She called her PCP and was reading her medication papers. She feels it is the letrozole that she started in April. Her PCP advised her to stop letrozole for 1 week then restart it and see how she feels. She describes it as a constant bad ache. She does have robaxin left from last week. She took 1/2 a pill b/c it makes her groggy. She is calling to let us know and she will keep [place] informed over the week. Stressed keeping [place] informed b/c we may need to change her medication.  Planned for CT 7/26 and OV with [facility] on 7/28.

## 2016-06-19 ENCOUNTER — Other Ambulatory Visit: Payer: Self-pay | Admitting: Cardiology

## 2016-06-23 ENCOUNTER — Encounter (HOSPITAL_COMMUNITY): Payer: Self-pay

## 2016-06-23 ENCOUNTER — Ambulatory Visit (HOSPITAL_COMMUNITY)
Admission: RE | Admit: 2016-06-23 | Discharge: 2016-06-23 | Disposition: A | Discharge status: Home / Self Care | Payer: Medicare Other | Source: Ambulatory Visit | Attending: Oncology | Admitting: Oncology

## 2016-06-23 DIAGNOSIS — C561 Malignant neoplasm of right ovary: Secondary | ICD-10-CM | POA: Insufficient documentation

## 2016-06-23 DIAGNOSIS — C7889 Secondary malignant neoplasm of other digestive organs: Secondary | ICD-10-CM | POA: Diagnosis present

## 2016-06-23 DIAGNOSIS — C7989 Secondary malignant neoplasm of other specified sites: Secondary | ICD-10-CM | POA: Diagnosis present

## 2016-06-23 DIAGNOSIS — C563 Malignant neoplasm of bilateral ovaries: Secondary | ICD-10-CM

## 2016-06-23 DIAGNOSIS — C562 Malignant neoplasm of left ovary: Secondary | ICD-10-CM

## 2016-06-23 DIAGNOSIS — K5641 Fecal impaction: Secondary | ICD-10-CM | POA: Insufficient documentation

## 2016-06-23 MED ORDER — IOPAMIDOL (ISOVUE-300) INJECTION 61%
100.0000 mL | Freq: Once | INTRAVENOUS | Status: AC | PRN
  Administered 2016-06-23: 100 mL via INTRAVENOUS

## 2016-06-24 ENCOUNTER — Telehealth: Payer: Self-pay

## 2016-06-24 NOTE — Telephone Encounter (Signed)
Pt took self off letrozole for 1 week d/t chest pain. The pain went away. She started back on letrozole last Thursday. She is having chest discomfort again, not quite as severe. This AM she also noticed she was slightly SOB. She stopped her letrozole this AM. She had a scan yesterday and is f/u with Dr Aldean Ast tomorrow. She does not want to continue with letrozole. She will consider trying something else.

## 2016-06-25 ENCOUNTER — Encounter: Payer: Self-pay | Admitting: Gynecology

## 2016-06-25 ENCOUNTER — Ambulatory Visit: Payer: Medicare Other | Attending: Gynecology | Admitting: Gynecology

## 2016-06-25 ENCOUNTER — Ambulatory Visit (HOSPITAL_BASED_OUTPATIENT_CLINIC_OR_DEPARTMENT_OTHER): Payer: Medicare Other

## 2016-06-25 ENCOUNTER — Other Ambulatory Visit (HOSPITAL_BASED_OUTPATIENT_CLINIC_OR_DEPARTMENT_OTHER): Payer: Medicare Other

## 2016-06-25 VITALS — BP 124/74 | HR 58 | Temp 98.2°F | Resp 18 | Ht 63.0 in | Wt 132.8 lb

## 2016-06-25 DIAGNOSIS — Z95828 Presence of other vascular implants and grafts: Secondary | ICD-10-CM

## 2016-06-25 DIAGNOSIS — N951 Menopausal and female climacteric states: Secondary | ICD-10-CM | POA: Diagnosis not present

## 2016-06-25 DIAGNOSIS — I959 Hypotension, unspecified: Secondary | ICD-10-CM

## 2016-06-25 DIAGNOSIS — C5701 Malignant neoplasm of right fallopian tube: Secondary | ICD-10-CM

## 2016-06-25 DIAGNOSIS — C5702 Malignant neoplasm of left fallopian tube: Secondary | ICD-10-CM

## 2016-06-25 DIAGNOSIS — R0789 Other chest pain: Secondary | ICD-10-CM

## 2016-06-25 DIAGNOSIS — Z85828 Personal history of other malignant neoplasm of skin: Secondary | ICD-10-CM | POA: Diagnosis not present

## 2016-06-25 DIAGNOSIS — Z7901 Long term (current) use of anticoagulants: Secondary | ICD-10-CM | POA: Diagnosis not present

## 2016-06-25 DIAGNOSIS — C562 Malignant neoplasm of left ovary: Secondary | ICD-10-CM

## 2016-06-25 DIAGNOSIS — C7989 Secondary malignant neoplasm of other specified sites: Secondary | ICD-10-CM

## 2016-06-25 DIAGNOSIS — I10 Essential (primary) hypertension: Secondary | ICD-10-CM | POA: Insufficient documentation

## 2016-06-25 DIAGNOSIS — C561 Malignant neoplasm of right ovary: Secondary | ICD-10-CM

## 2016-06-25 DIAGNOSIS — Z8543 Personal history of malignant neoplasm of ovary: Secondary | ICD-10-CM | POA: Insufficient documentation

## 2016-06-25 DIAGNOSIS — C569 Malignant neoplasm of unspecified ovary: Secondary | ICD-10-CM | POA: Insufficient documentation

## 2016-06-25 DIAGNOSIS — Z888 Allergy status to other drugs, medicaments and biological substances status: Secondary | ICD-10-CM | POA: Insufficient documentation

## 2016-06-25 DIAGNOSIS — Z79899 Other long term (current) drug therapy: Secondary | ICD-10-CM | POA: Insufficient documentation

## 2016-06-25 DIAGNOSIS — I480 Paroxysmal atrial fibrillation: Secondary | ICD-10-CM | POA: Diagnosis not present

## 2016-06-25 DIAGNOSIS — Z9889 Other specified postprocedural states: Secondary | ICD-10-CM | POA: Insufficient documentation

## 2016-06-25 DIAGNOSIS — I252 Old myocardial infarction: Secondary | ICD-10-CM | POA: Diagnosis not present

## 2016-06-25 DIAGNOSIS — E039 Hypothyroidism, unspecified: Secondary | ICD-10-CM | POA: Insufficient documentation

## 2016-06-25 DIAGNOSIS — K5909 Other constipation: Secondary | ICD-10-CM | POA: Insufficient documentation

## 2016-06-25 DIAGNOSIS — R490 Dysphonia: Secondary | ICD-10-CM

## 2016-06-25 DIAGNOSIS — E785 Hyperlipidemia, unspecified: Secondary | ICD-10-CM | POA: Diagnosis not present

## 2016-06-25 DIAGNOSIS — C563 Malignant neoplasm of bilateral ovaries: Secondary | ICD-10-CM

## 2016-06-25 LAB — MORPHOLOGY
PLT EST: DECREASED
RBC COMMENTS: NORMAL

## 2016-06-25 LAB — COMPREHENSIVE METABOLIC PANEL
ALT: 20 U/L (ref 0–55)
AST: 19 U/L (ref 5–34)
Albumin: 4.1 g/dL (ref 3.5–5.0)
Alkaline Phosphatase: 72 U/L (ref 40–150)
Anion Gap: 10 mEq/L (ref 3–11)
BUN: 17.9 mg/dL (ref 7.0–26.0)
CALCIUM: 9.9 mg/dL (ref 8.4–10.4)
CHLORIDE: 103 meq/L (ref 98–109)
CO2: 25 mEq/L (ref 22–29)
Creatinine: 0.9 mg/dL (ref 0.6–1.1)
EGFR: 65 mL/min/{1.73_m2} — AB (ref >90)
Glucose: 89 mg/dl (ref 70–140)
POTASSIUM: 4.6 meq/L (ref 3.5–5.1)
Sodium: 137 mEq/L (ref 136–145)
Total Bilirubin: 0.76 mg/dL (ref 0.20–1.20)
Total Protein: 6.8 g/dL (ref 6.4–8.3)

## 2016-06-25 LAB — CBC WITH DIFFERENTIAL/PLATELET
BASO%: 0.2 % (ref 0.0–2.0)
BASOS ABS: 0 10*3/uL (ref 0.0–0.1)
EOS%: 0.2 % (ref 0.0–7.0)
Eosinophils Absolute: 0 10*3/uL (ref 0.0–0.5)
HEMATOCRIT: 33.1 % — AB (ref 34.8–46.6)
HGB: 11.2 g/dL — ABNORMAL LOW (ref 11.6–15.9)
LYMPH#: 1.6 10*3/uL (ref 0.9–3.3)
LYMPH%: 26.9 % (ref 14.0–49.7)
MCH: 33.9 pg (ref 25.1–34.0)
MCHC: 33.8 g/dL (ref 31.5–36.0)
MCV: 100.3 fL (ref 79.5–101.0)
MONO#: 0.5 10*3/uL (ref 0.1–0.9)
MONO%: 9.4 % (ref 0.0–14.0)
NEUT#: 3.7 10*3/uL (ref 1.5–6.5)
NEUT%: 63.3 % (ref 38.4–76.8)
Platelets: 121 10*3/uL — ABNORMAL LOW (ref 145–400)
RBC: 3.3 10*6/uL — ABNORMAL LOW (ref 3.70–5.45)
RDW: 13.1 % (ref 11.2–14.5)
WBC: 5.8 10*3/uL (ref 3.9–10.3)

## 2016-06-25 MED ORDER — HEPARIN SOD (PORK) LOCK FLUSH 100 UNIT/ML IV SOLN
500.0000 [IU] | Freq: Once | INTRAVENOUS | Status: AC | PRN
  Administered 2016-06-25: 500 [IU] via INTRAVENOUS
  Filled 2016-06-25: qty 5

## 2016-06-25 MED ORDER — SODIUM CHLORIDE 0.9 % IJ SOLN
10.0000 mL | INTRAMUSCULAR | Status: DC | PRN
  Administered 2016-06-25: 10 mL via INTRAVENOUS
  Filled 2016-06-25: qty 10

## 2016-06-25 NOTE — Progress Notes (Signed)
Consult Note: Gyn-Onc   [name] 75 y.o. female  Chief Complaint  Patient presents with  . 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   The patient's primary concern is pain in her right anterior chest above the Port-A-Cath. She has the believe that this is secondary to Letrozole.    Given the patient's strong believe that her chest symptoms are secondary to letrozole, I suggested that we "experimenting" and discontinued letrozole for the next 2-3 weeks. If her pain resolves then we might consider some other medication. On the other hand she continues to have the pain I would recommend returning to the use of letrozole as she is having a nice response. She is also having some hot flushes but these seem to be tolerable.   She is also has some difficulties with hypotension. Advised her to contact her cardiologist regarding her blood pressure medications.  Interval History:   The patient returns today as previously scheduled.  Since her last visit she's been taking letrozole. A repeat CT scan shows that the lesion near the spleen now measure 1.5 x 1.5 cm (previously 2.4 x 3.9 cm) Patient's most recent CA-125 is 13.2 (previously 14.5 units per mL) Otherwise she has no abdominal or pelvic symptoms. She has some chronic constipation. Otherwise she has no GI GU or pelvic symptoms.  HPI:The patient initially presented with a pelvic mass and elevated CA 125 (178 units per mL) She underwent exploratory laparotomy and debulking on 02/13/2013. Final pathology showed a poorly differentiated ovarian cancer involving both ovaries pelvic peritoneum and rectal muscularis. All gross disease was resected.  She then received 6 cycles of carboplatin and Taxol chemotherapy completed in September 2014. At the completion of chemotherapy her CA 125 was 18 units per mL.   In May 2016  the patient CA-125 began to rise and a PET CT scan showed metastatic disease in the spleen and pelvic lymph nodes. Given the long platinum free interval , we retreated with carboplatin, Taxol, and a Avastin. She received 6 cycles of carboplatin and Taxol and a Avastin the last being administered in September 2016. She had a significant response with CA 125 falling to 12 units per mL in September. CT scan showed persistent disease in the spleen (which was actually responding). Patient received a total 8 cycles and on follow-up in December 2016 had persistent disease in the spleen which had slightly decreased in size. At that time CA-125 is 14 units per mL.  In March 2017 we switch the patient to letrozole.   A CT scan obtained 06/23/2016 showed a response with the spleen met measure 1.5 x 1.5 cm (previously 2.4 x 3.9 cm)   Review of Systems:10 point review of systems is negative except as noted in interval history.   Vitals: Blood pressure 124/74, pulse (!) 58, temperature 98.2 F (36.8 C), temperature source Oral, resp. rate 18, height 5' 3"  (1.6 m), weight 132 lb 12.8 oz (60.2 kg), SpO2 100 %.  Physical Exam: General : The patient is a healthy woman in no acute distress.  HEENT: normocephalic, extraoccular movements normal; neck is supple without thyromegally , Port-A-Cath appears healthy is no evidence of infection in the right chest wall. Lynphnodes: Supraclavicular and inguinal nodes not enlarged  Abdomen: Soft, non-tender, no ascites, no organomegally, no masses, no hernias , midline incision is healing  well  Pelvic:    EGBUS: Normal female  Vagina bladder urethra: Normal  Vaginal cuff is well healed.  Cervix and uterus are surgically absent  Bimanual exam: No masses nodularity or fullness.  Rectovaginal exam confirms      Lower extremities: No edema or varicosities. Normal range of motion      Allergies  Allergen Reactions  . Coreg [Carvedilol] Shortness Of Breath  .  Caffeine Other (See Comments)    Makes heart race  . Ceftin [Cefuroxime Axetil]     Interferes with propafenone.   (Note pt is no longer taking propafenone 06/04/16)  . Clonidine Derivatives Other (See Comments)    Pt does not remember what the reaction was  . Codeine Other (See Comments)    Does not like the feeling she gets  . Flexeril [Cyclobenzaprine] Other (See Comments)    Pt states "increased heart rate"  . Levaquin [Levofloxacin In D5w]     Interferes with propafenone.  (note: pt is no longer taking propafenone 06/04/16)  . Lipitor [Atorvastatin] Dermatitis    "feels like bugs are biting her"   . Lisinopril     LIP NUMBNESS  . Pravastatin Other (See Comments)    FEELS LIKE "BUG BITES" BITING HER LEGS   . Tape Rash    TEGADERM.   (use opsite on PAC)  . Tegaderm Ag Mesh [Silver] Rash and Other (See Comments)    Burns skin  . Ultram [Tramadol] Palpitations    Past Medical History:  Diagnosis Date  . Arthritis    OSTEOARTHRITIS   -- CONSTANT PAIN RIGHT HIP---AND PAIN LEFT KNEE--PT STATES SHE GETS INJECTIONS INTO HER KNEE  . Complication of anesthesia    BLOOD PRESSURE DROPPED WITH NASAL SURGERY, ONE OF THE CARPAL TUNNEL REPAIRS AND DURING A COLONOSCOPY  . Dyslipidemia   . History of skin cancer   . Hypertension   . Hypothyroidism   . Menopausal symptoms   . NSTEMI (non-ST elevated myocardial infarction) ([place]) 12/09/15   Medical management: Distal branch of D1 95%, ostial D2 75%, distal LAD 50%. Tortuous arteries consistent with hypertension  . Ovarian cancer ([place]) 01/2013   Recurrence since 2014/2016  . PAF (paroxysmal atrial fibrillation) ([place])    Only on Plavix -- not full Anticoagulation per pt. request; On Amiodarone (Eye Exam 12/25/15)    Past Surgical History:  Procedure Laterality Date  . BILATERAL CARPAL TUNNEL REPAIR  2007  . CARDIAC CATHETERIZATION N/A 12/05/2015   Procedure: Left Heart Cath and Coronary Angiography;  Surgeon: Belva Crome, MD;  Location: [facility];  Service: Cardiovascular;  distal branch of D1 95%, ostial D2 75%, dLAD 50%,p-mRCA 40%. Tortuous vessels.  Marland Kitchen DILATION AND CURETTAGE OF UTERUS  1969  . LAPAROTOMY Bilateral 02/13/2013   Procedure: EXPLORATORY LAPAROTOMY TOTAL ABDOMINAL HYSTERECTOMY BILATERAL SALPINGO-OOPHORECTOMY, Partial Rectal Resection with Reanastamosis;  Surgeon: Alvino Chapel, MD;  Location: [facility];  Service: Gynecology;  Laterality: Bilateral;  . LEFT KNEE ARTHROSCOPY   2011  . LYMPHADENECTOMY Right 02/13/2013   Procedure: PEVLIC  LYMPHADENECTOMY, DEBULKING right pelvic tumor nodules;  Surgeon: Alvino Chapel, MD;  Location: [facility];  Service: Gynecology;  Laterality: Right;  . NM MYOVIEW LTD  June 2010    subbmaximal with no ischemia or infarction.  . OMENTECTOMY  02/13/2013   Procedure: OMENTECTOMY;  Surgeon: Alvino Chapel, MD;  Location: [facility];  Service: Gynecology;;  . [place]  . SURGERY FOR RUPTURED OVARIAN CYST  1969  . TAH/BSO/Tumor debulking with right pelvic LND  01/2013  . TONSILLECTOMY  1962  . TOTAL HIP ARTHROPLASTY  02/25/2012   Procedure: TOTAL HIP ARTHROPLASTY ANTERIOR APPROACH;  Surgeon: Mcarthur Rossetti, MD;  Location: [facility];  Service: Orthopedics;  Laterality: Right;  . TOTAL KNEE ARTHROPLASTY Left 01/03/2015   Procedure: LEFT TOTAL KNEE ARTHROPLASTY;  Surgeon: Mcarthur Rossetti, MD;  Location: [facility];  Service: Orthopedics;  Laterality: Left;  . TRANSTHORACIC ECHOCARDIOGRAM  May 2014; January 2016   a. Normal LV size function. EF 60-65%. Grade 1 diastolic function. Mild MR and mildly elevated PA pressures of 37 mmHg per;; b. EF 60-65%. Mobile echodensity 10 mm x 6 mm attest interventricular septum is questionable fibroblastoma.    Current Outpatient Prescriptions  Medication Sig Dispense Refill  . acetaminophen (TYLENOL) 500 MG tablet Take 1,000 mg by mouth every 6 (six) hours as needed (pain).    Marland Kitchen amiodarone (PACERONE)  200 MG tablet Take 0.5 tablets (100 mg total) by mouth daily. 45 tablet 3  . amLODipine (NORVASC) 5 MG tablet Take 5 mg by mouth daily.  3  . bisacodyl (DULCOLAX) 5 MG EC tablet Take 5 mg by mouth daily as needed for moderate constipation. Reported on 12/25/2015    . desonide (DESOWEN) 0.05 % cream Apply 1 application topically 2 (two) times daily.   1  . diphenhydrAMINE (BENADRYL) 25 MG tablet Take 37.5 mg by mouth at bedtime as needed for sleep.    Marland Kitchen docusate sodium (COLACE) 100 MG capsule Take 100 mg by mouth at bedtime.     Marland Kitchen ELIQUIS 5 MG TABS tablet TAKE 1 TABLET (5 MG TOTAL) BY MOUTH 2 (TWO) TIMES DAILY. 60 tablet 5  . ezetimibe (ZETIA) 10 MG tablet Take 1 tablet (10 mg total) by mouth daily. (Patient taking differently: Take 10 mg by mouth at bedtime. ) 30 tablet 6  . famotidine (PEPCID) 10 MG tablet Take 10 mg by mouth daily as needed.     . Glucosamine HCl 1000 MG TABS Take 1,000 mg by mouth daily.    . hydrocortisone 1 % ointment Apply 1 application topically 2 (two) times daily as needed (eczema).    Marland Kitchen ipratropium (ATROVENT) 0.06 % nasal spray Place 2 sprays into both nostrils daily.   5  . irbesartan (AVAPRO) 300 MG tablet TAKE 1 TABLET (300 MG TOTAL) BY MOUTH DAILY. (Patient taking differently: TAKE 1 TABLET (300 MG TOTAL) BY MOUTH DAILY AT BEDTIME) 90 tablet 0  . levothyroxine (SYNTHROID) 88 MCG tablet Take 88 mcg by mouth daily before breakfast.    . lidocaine-prilocaine (EMLA) cream Apply to Porta-Cath site 1-2 hours prior to access as directed. (Patient taking differently: Apply 1 application topically See admin instructions. Apply to Porta-Cath site 1-2 hours prior to access as directed.) 30 g 2  . loratadine (CLARITIN) 10 MG tablet Take 10 mg by mouth daily as needed for allergies. Reported on 12/25/2015    . magnesium hydroxide (MILK OF MAGNESIA) 800 MG/5ML suspension Take 30 mLs by mouth daily as needed for constipation. Reported on 11/20/2015    . Melatonin 5 MG CAPS Take 5 mg by  mouth at bedtime as needed (sleep). Reported on 12/25/2015    . Menthol, Topical Analgesic, (ICY HOT EX) Apply 1 application topically daily as needed (pain.).    Marland Kitchen methocarbamol (ROBAXIN) 500 MG tablet Take 1 tablet (500 mg total) by mouth 2 (two) times daily. 20 tablet 0  . metoprolol (LOPRESSOR) 50 MG  tablet Take 25 mg by mouth 2 (two) times daily.    . Multiple Vitamin (MULTIVITAMIN WITH MINERALS) TABS tablet Take 1 tablet by mouth daily. Centrum Silver    . ondansetron (ZOFRAN) 8 MG tablet Take 1 tablet (8 mg total) by mouth every 8 (eight) hours as needed for nausea or vomiting (Will not make drowsy). (Patient taking differently: Take 8 mg by mouth See admin instructions. Take 1 tablet (8 mg) by mouth the night before and the night of chemo, may also take as needed for nausea and vomiting from chemo) 30 tablet 1  . pantoprazole (PROTONIX) 40 MG tablet Take 40 mg by mouth daily as needed.    Vladimir Faster Glycol-Propyl Glycol (SYSTANE OP) Place 1 drop into both eyes 2 (two) times daily.     . polyethylene glycol (MIRALAX / GLYCOLAX) packet Take 17 g by mouth daily. Mix in 8 oz liquid and drink    . potassium chloride (K-DUR) 10 MEQ tablet Take 10 mEq by mouth every Monday, Wednesday, and Friday.    Marland Kitchen spironolactone (ALDACTONE) 25 MG tablet Take 25 mg by mouth daily.     Marland Kitchen amoxicillin (AMOXIL) 500 MG capsule Take 2,000 mg by mouth See admin instructions. Take 4 capsules (2000 mg) by mouth one hour prior to dental appointment - next appoint August 2017     No current facility-administered medications for this visit.     Social History   Social History  . Marital status: Married    Spouse name: N/A  . Number of children: N/A  . Years of education: N/A   Occupational History  . Not on file.   Social History Main Topics  . Smoking status: Never Smoker  . Smokeless tobacco: Never Used  . Alcohol use No  . Drug use: No  . Sexual activity: Not Currently   Other Topics Concern  . Not on  file   Social History Narrative   Married mother of 3, grandmother 46.   Exercises 6/7 days a week walking 30 minutes a time.   Never smoked or drank alcohol.    Family History  Problem Relation Age of Onset  . Hypertension Mother   . Basal cell carcinoma Mother   . AAA (abdominal aortic aneurysm) Father   . Heart Problems Maternal Uncle   . Heart Problems Maternal Grandfather   . AAA (abdominal aortic aneurysm) Paternal Grandmother   . Prostate cancer Maternal Uncle 70  . Prostate cancer Other   . Other Daughter     one daughter had TAH-BSO at 29; other daughter will have one soon      Marti Sleigh, MD 06/25/2016, 1:40 PM       Consult Note: Gyn-Onc   [name] 75 y.o. female  Chief Complaint  Patient presents with  . Ovarian Cancer    Follow up visit    Assessment and plan :   Stage IIb poorly differentiated ovarian cancer currently responding to therapy with carboplatin , Taxol, and a Avastin. Poorly controlled hypertension.  The most recent CT scan is reviewed with the patient and her husband. It appears that there is continued but slight decrease size of the metastasis in the spleen. On the most recent report there is also some question about adenopathy and resolution of possible peritoneal implants in the pelvis. This is a different interpretation than what I had seen in the prior CT scan report and raises the question as to whether the patient might have other metastatic  disease. Therefore, we'll obtain a PET scan to further evaluate whether the lesion in the spleen is solitary or whether there are other metastatic sites. Clearly if there are other metastatic sites I would not recommend splenectomy. But, rather, would consider changing chemotherapy to another regimen.  We will schedule the PET scan and allow the patient have a therapeutic holiday over the Christmas holidays. She has been very happy to have time off of chemotherapy.  Interval  History:   The patient returns today as previously scheduled.  Since her last visit she's received 2 additional cycles of carboplatin, Taxol, and a Avastin. We had planned 3 cycles but the third is been held because the patient's hypertension. Further, the patient reports that after each cycle she's becoming more more fatigued and more nausea and quality of life has diminished considerably.  She had a CT scan to reassess her disease status on December 8. This noted that the lesion in the spleen has had "mild" decrease in size. Currently the lesion measures 3.5 x 3.2 cm (previously 3.5 x 3.9 cm. In addition, the radiologist reports that peritoneal implants within the pelvis are no longer measurable and that there is resolution of previously enlarged right external iliac node. In addition there is a right hilar node which is borderline enlarged and increased in size when compared to scan of May 2014. Patient's most recent CA-125 is 14 (previously 12) Otherwise she has no abdominal or pelvic symptoms.  HPI:The patient initially presented with a pelvic mass and elevated CA 125 (178 units per mL) She underwent exploratory laparotomy and debulking on 02/13/2013. Final pathology showed a poorly differentiated ovarian cancer involving both ovaries pelvic peritoneum and rectal muscularis. All gross disease was resected.  She then received 6 cycles of carboplatin and Taxol chemotherapy completed in September 2014. At the completion of chemotherapy her CA 125 was 18 units per mL.   In May 2016 the patient CA-125 began to rise and a PET CT scan showed metastatic disease in the spleen and pelvic lymph nodes. Given the long platinum free interval , we retreated with carboplatin, Taxol, and a Avastin. She received 6 cycles of carboplatin and Taxol and a Avastin the last being administered in September 2016. She had a significant response with CA 125 falling to 12 units per mL in September. CT scan showed persistent  disease in the spleen (which was actually responding). Patient received a total 8 cycles and on follow-up in December 2016 had persistent disease in the spleen which had slightly decreased in size. At that time CA-125 is 14 units per mL.   Review of Systems:10 point review of systems is negative except as noted in interval history.   Vitals: Blood pressure 124/74, pulse (!) 58, temperature 98.2 F (36.8 C), temperature source Oral, resp. rate 18, height 5' 3"  (1.6 m), weight 132 lb 12.8 oz (60.2 kg), SpO2 100 %.  Physical Exam: General : The patient is a healthy woman in no acute distress.  HEENT: normocephalic, extraoccular movements normal; neck is supple without thyromegally  Lynphnodes: Supraclavicular and inguinal nodes not enlarged  Abdomen: Soft, non-tender, no ascites, no organomegally, no masses, no hernias , midline incision is healing well  Pelvic:    EGBUS: Normal female  Vagina bladder urethra: Normal  Vaginal cuff is well healed.  Cervix and uterus are surgically absent  Bimanual exam: No masses nodularity or fullness.  Rectovaginal exam confirms      Lower extremities: No edema or varicosities. Normal range  of motion      Allergies  Allergen Reactions  . Coreg [Carvedilol] Shortness Of Breath  . Caffeine Other (See Comments)    Makes heart race  . Ceftin [Cefuroxime Axetil]     Interferes with propafenone.   (Note pt is no longer taking propafenone 06/04/16)  . Clonidine Derivatives Other (See Comments)    Pt does not remember what the reaction was  . Codeine Other (See Comments)    Does not like the feeling she gets  . Flexeril [Cyclobenzaprine] Other (See Comments)    Pt states "increased heart rate"  . Levaquin [Levofloxacin In D5w]     Interferes with propafenone.  (note: pt is no longer taking propafenone 06/04/16)  . Lipitor [Atorvastatin] Dermatitis    "feels like bugs are biting her"   . Lisinopril     LIP NUMBNESS  . Pravastatin Other (See  Comments)    FEELS LIKE "BUG BITES" BITING HER LEGS   . Tape Rash    TEGADERM.   (use opsite on PAC)  . Tegaderm Ag Mesh [Silver] Rash and Other (See Comments)    Burns skin  . Ultram [Tramadol] Palpitations    Past Medical History:  Diagnosis Date  . Arthritis    OSTEOARTHRITIS   -- CONSTANT PAIN RIGHT HIP---AND PAIN LEFT KNEE--PT STATES SHE GETS INJECTIONS INTO HER KNEE  . Complication of anesthesia    BLOOD PRESSURE DROPPED WITH NASAL SURGERY, ONE OF THE CARPAL TUNNEL REPAIRS AND DURING A COLONOSCOPY  . Dyslipidemia   . History of skin cancer   . Hypertension   . Hypothyroidism   . Menopausal symptoms   . NSTEMI (non-ST elevated myocardial infarction) ([place]) 12/09/15   Medical management: Distal branch of D1 95%, ostial D2 75%, distal LAD 50%. Tortuous arteries consistent with hypertension  . Ovarian cancer ([place]) 01/2013   Recurrence since 2014/2016  . PAF (paroxysmal atrial fibrillation) ([place])    Only on Plavix -- not full Anticoagulation per pt. request; On Amiodarone (Eye Exam 12/25/15)    Past Surgical History:  Procedure Laterality Date  . BILATERAL CARPAL TUNNEL REPAIR  2007  . CARDIAC CATHETERIZATION N/A 12/05/2015   Procedure: Left Heart Cath and Coronary Angiography;  Surgeon: Belva Crome, MD;  Location: [facility];  Service: Cardiovascular;  distal branch of D1 95%, ostial D2 75%, dLAD 50%,p-mRCA 40%. Tortuous vessels.  Marland Kitchen DILATION AND CURETTAGE OF UTERUS  1969  . LAPAROTOMY Bilateral 02/13/2013   Procedure: EXPLORATORY LAPAROTOMY TOTAL ABDOMINAL HYSTERECTOMY BILATERAL SALPINGO-OOPHORECTOMY, Partial Rectal Resection with Reanastamosis;  Surgeon: Alvino Chapel, MD;  Location: [facility];  Service: Gynecology;  Laterality: Bilateral;  . LEFT KNEE ARTHROSCOPY   2011  . LYMPHADENECTOMY Right 02/13/2013   Procedure: PEVLIC  LYMPHADENECTOMY, DEBULKING right pelvic tumor nodules;  Surgeon: Alvino Chapel, MD;  [facility];  Service: Gynecology;   Laterality: Right;  . NM MYOVIEW LTD  June 2010    subbmaximal with no ischemia or infarction.  . OMENTECTOMY  02/13/2013   Procedure: OMENTECTOMY;  Surgeon: Alvino Chapel, MD;  Location: [facility];  Service: Gynecology;;  . [place]  . SURGERY FOR RUPTURED OVARIAN CYST  1969  . TAH/BSO/Tumor debulking with right pelvic LND  01/2013  . TONSILLECTOMY  1962  . TOTAL HIP ARTHROPLASTY  02/25/2012   Procedure: TOTAL HIP ARTHROPLASTY ANTERIOR APPROACH;  Surgeon: Mcarthur Rossetti, MD;  [facility];  Service: Orthopedics;  Laterality: Right;  . TOTAL KNEE  ARTHROPLASTY Left 01/03/2015   Procedure: LEFT TOTAL KNEE ARTHROPLASTY;  Surgeon: Mcarthur Rossetti, MD;  [facility];  Service: Orthopedics;  Laterality: Left;  . TRANSTHORACIC ECHOCARDIOGRAM  May 2014; January 2016   a. Normal LV size function. EF 60-65%. Grade 1 diastolic function. Mild MR and mildly elevated PA pressures of 37 mmHg per;; b. EF 60-65%. Mobile echodensity 10 mm x 6 mm attest interventricular septum is questionable fibroblastoma.    Current Outpatient Prescriptions  Medication Sig Dispense Refill  . acetaminophen (TYLENOL) 500 MG tablet Take 1,000 mg by mouth every 6 (six) hours as needed (pain).    Marland Kitchen amiodarone (PACERONE) 200 MG tablet Take 0.5 tablets (100 mg total) by mouth daily. 45 tablet 3  . amLODipine (NORVASC) 5 MG tablet Take 5 mg by mouth daily.  3  . bisacodyl (DULCOLAX) 5 MG EC tablet Take 5 mg by mouth daily as needed for moderate constipation. Reported on 12/25/2015    . desonide (DESOWEN) 0.05 % cream Apply 1 application topically 2 (two) times daily.   1  . diphenhydrAMINE (BENADRYL) 25 MG tablet Take 37.5 mg by mouth at bedtime as needed for sleep.    Marland Kitchen docusate sodium (COLACE) 100 MG capsule Take 100 mg by mouth at bedtime.     Marland Kitchen ELIQUIS 5 MG TABS tablet TAKE 1 TABLET (5 MG TOTAL) BY MOUTH 2 (TWO) TIMES DAILY. 60 tablet 5  . ezetimibe (ZETIA) 10 MG tablet Take  1 tablet (10 mg total) by mouth daily. (Patient taking differently: Take 10 mg by mouth at bedtime. ) 30 tablet 6  . famotidine (PEPCID) 10 MG tablet Take 10 mg by mouth daily as needed.     . Glucosamine HCl 1000 MG TABS Take 1,000 mg by mouth daily.    . hydrocortisone 1 % ointment Apply 1 application topically 2 (two) times daily as needed (eczema).    Marland Kitchen ipratropium (ATROVENT) 0.06 % nasal spray Place 2 sprays into both nostrils daily.   5  . irbesartan (AVAPRO) 300 MG tablet TAKE 1 TABLET (300 MG TOTAL) BY MOUTH DAILY. (Patient taking differently: TAKE 1 TABLET (300 MG TOTAL) BY MOUTH DAILY AT BEDTIME) 90 tablet 0  . levothyroxine (SYNTHROID) 88 MCG tablet Take 88 mcg by mouth daily before breakfast.    . lidocaine-prilocaine (EMLA) cream Apply to Porta-Cath site 1-2 hours prior to access as directed. (Patient taking differently: Apply 1 application topically See admin instructions. Apply to Porta-Cath site 1-2 hours prior to access as directed.) 30 g 2  . loratadine (CLARITIN) 10 MG tablet Take 10 mg by mouth daily as needed for allergies. Reported on 12/25/2015    . magnesium hydroxide (MILK OF MAGNESIA) 800 MG/5ML suspension Take 30 mLs by mouth daily as needed for constipation. Reported on 11/20/2015    . Melatonin 5 MG CAPS Take 5 mg by mouth at bedtime as needed (sleep). Reported on 12/25/2015    . Menthol, Topical Analgesic, (ICY HOT EX) Apply 1 application topically daily as needed (pain.).    Marland Kitchen methocarbamol (ROBAXIN) 500 MG tablet Take 1 tablet (500 mg total) by mouth 2 (two) times daily. 20 tablet 0  . metoprolol (LOPRESSOR) 50 MG tablet Take 25 mg by mouth 2 (two) times daily.    . Multiple Vitamin (MULTIVITAMIN WITH MINERALS) TABS tablet Take 1 tablet by mouth daily. Centrum Silver    . ondansetron (ZOFRAN) 8 MG tablet Take 1 tablet (8 mg total) by mouth every 8 (eight) hours as needed  for nausea or vomiting (Will not make drowsy). (Patient taking differently: Take 8 mg by mouth See  admin instructions. Take 1 tablet (8 mg) by mouth the night before and the night of chemo, may also take as needed for nausea and vomiting from chemo) 30 tablet 1  . pantoprazole (PROTONIX) 40 MG tablet Take 40 mg by mouth daily as needed.    Vladimir Faster Glycol-Propyl Glycol (SYSTANE OP) Place 1 drop into both eyes 2 (two) times daily.     . polyethylene glycol (MIRALAX / GLYCOLAX) packet Take 17 g by mouth daily. Mix in 8 oz liquid and drink    . potassium chloride (K-DUR) 10 MEQ tablet Take 10 mEq by mouth every Monday, Wednesday, and Friday.    Marland Kitchen spironolactone (ALDACTONE) 25 MG tablet Take 25 mg by mouth daily.     Marland Kitchen amoxicillin (AMOXIL) 500 MG capsule Take 2,000 mg by mouth See admin instructions. Take 4 capsules (2000 mg) by mouth one hour prior to dental appointment - next appoint August 2017     No current facility-administered medications for this visit.     Social History   Social History  . Marital status: Married    Spouse name: N/A  . Number of children: N/A  . Years of education: N/A   Occupational History  . Not on file.   Social History Main Topics  . Smoking status: Never Smoker  . Smokeless tobacco: Never Used  . Alcohol use No  . Drug use: No  . Sexual activity: Not Currently   Other Topics Concern  . Not on file   Social History Narrative   Married mother of 3, grandmother 73.   Exercises 6/7 days a week walking 30 minutes a time.   Never smoked or drank alcohol.    Family History  Problem Relation Age of Onset  . Hypertension Mother   . Basal cell carcinoma Mother   . AAA (abdominal aortic aneurysm) Father   . Heart Problems Maternal Uncle   . Heart Problems Maternal Grandfather   . AAA (abdominal aortic aneurysm) Paternal Grandmother   . Prostate cancer Maternal Uncle 70  . Prostate cancer Other   . Other Daughter     one daughter had TAH-BSO at 87; other daughter will have one soon      Marti Sleigh, MD 06/25/2016, 1:40  PM

## 2016-06-25 NOTE — Patient Instructions (Signed)
Plan to stop taking Letrozole for 2-3 weeks and see how you feel.  If your symptoms improve, switching to another antiestrogen therapy would be Dr. Lunette Stands recommendations.  Plan to follow up in six months or sooner if needed.  Your appointment for six months can be arranged at your appointment in the fall with Dr. Marko Plume.

## 2016-06-26 LAB — CA 125: CANCER ANTIGEN (CA) 125: 14.6 U/mL (ref 0.0–38.1)

## 2016-06-28 ENCOUNTER — Other Ambulatory Visit: Payer: Self-pay | Admitting: Oncology

## 2016-06-28 ENCOUNTER — Telehealth: Payer: Self-pay | Admitting: Oncology

## 2016-06-28 DIAGNOSIS — C563 Malignant neoplasm of bilateral ovaries: Secondary | ICD-10-CM

## 2016-06-28 DIAGNOSIS — C562 Malignant neoplasm of left ovary: Secondary | ICD-10-CM

## 2016-06-28 DIAGNOSIS — C7989 Secondary malignant neoplasm of other specified sites: Secondary | ICD-10-CM

## 2016-06-28 DIAGNOSIS — C561 Malignant neoplasm of right ovary: Secondary | ICD-10-CM

## 2016-06-28 NOTE — Telephone Encounter (Signed)
Spoke with pt to confirm 8/21 appt at 215 pm per LL pof

## 2016-06-28 NOTE — Telephone Encounter (Signed)
Medical Oncology  Discussed with Dr Josephina Shih after he saw patient on 06-25-16: responding to letrozole however patient believes chest wall discomfort from that medication so holding for 2-3 wks. PAC not remarkable at his exam. PAC functioned well at blood draw and flush also 06-25-16 per EMR and patient by phone now.  MD spoke with patient by phone now. Told patient that CA 125 marker stable. Suggested that I could see her in ~ 3 weeks to follow up, which she would like to do. She sees Dr Virgina Jock on 07-23-16. POF to schedulers.  Godfrey Pick, MD

## 2016-07-01 ENCOUNTER — Other Ambulatory Visit: Payer: Self-pay | Admitting: Cardiology

## 2016-07-18 ENCOUNTER — Other Ambulatory Visit: Payer: Self-pay | Admitting: Oncology

## 2016-07-19 ENCOUNTER — Ambulatory Visit (HOSPITAL_BASED_OUTPATIENT_CLINIC_OR_DEPARTMENT_OTHER): Payer: Medicare Other

## 2016-07-19 ENCOUNTER — Ambulatory Visit: Payer: Medicare Other | Admitting: Oncology

## 2016-07-19 ENCOUNTER — Encounter: Payer: Self-pay | Admitting: Oncology

## 2016-07-19 ENCOUNTER — Telehealth: Payer: Self-pay | Admitting: Oncology

## 2016-07-19 VITALS — BP 126/69 | HR 63 | Temp 98.4°F | Resp 18 | Ht 63.0 in | Wt 133.3 lb

## 2016-07-19 DIAGNOSIS — K5900 Constipation, unspecified: Secondary | ICD-10-CM

## 2016-07-19 DIAGNOSIS — C7889 Secondary malignant neoplasm of other digestive organs: Secondary | ICD-10-CM

## 2016-07-19 DIAGNOSIS — C7989 Secondary malignant neoplasm of other specified sites: Secondary | ICD-10-CM

## 2016-07-19 DIAGNOSIS — G620 Drug-induced polyneuropathy: Secondary | ICD-10-CM

## 2016-07-19 DIAGNOSIS — I10 Essential (primary) hypertension: Secondary | ICD-10-CM

## 2016-07-19 DIAGNOSIS — C563 Malignant neoplasm of bilateral ovaries: Secondary | ICD-10-CM

## 2016-07-19 DIAGNOSIS — D696 Thrombocytopenia, unspecified: Secondary | ICD-10-CM

## 2016-07-19 DIAGNOSIS — Z95828 Presence of other vascular implants and grafts: Secondary | ICD-10-CM

## 2016-07-19 DIAGNOSIS — Z7901 Long term (current) use of anticoagulants: Secondary | ICD-10-CM

## 2016-07-19 DIAGNOSIS — C561 Malignant neoplasm of right ovary: Secondary | ICD-10-CM | POA: Diagnosis not present

## 2016-07-19 DIAGNOSIS — C569 Malignant neoplasm of unspecified ovary: Secondary | ICD-10-CM

## 2016-07-19 DIAGNOSIS — C562 Malignant neoplasm of left ovary: Secondary | ICD-10-CM

## 2016-07-19 DIAGNOSIS — E039 Hypothyroidism, unspecified: Secondary | ICD-10-CM

## 2016-07-19 DIAGNOSIS — C5702 Malignant neoplasm of left fallopian tube: Secondary | ICD-10-CM | POA: Diagnosis not present

## 2016-07-19 DIAGNOSIS — C5701 Malignant neoplasm of right fallopian tube: Secondary | ICD-10-CM

## 2016-07-19 DIAGNOSIS — I480 Paroxysmal atrial fibrillation: Secondary | ICD-10-CM

## 2016-07-19 LAB — CBC WITH DIFFERENTIAL/PLATELET
BASO%: 0.2 % (ref 0.0–2.0)
BASOS ABS: 0 10*3/uL (ref 0.0–0.1)
EOS ABS: 0 10*3/uL (ref 0.0–0.5)
EOS%: 0.4 % (ref 0.0–7.0)
HCT: 32.9 % — ABNORMAL LOW (ref 34.8–46.6)
HEMOGLOBIN: 11.2 g/dL — AB (ref 11.6–15.9)
LYMPH%: 31.5 % (ref 14.0–49.7)
MCH: 34.3 pg — AB (ref 25.1–34.0)
MCHC: 34 g/dL (ref 31.5–36.0)
MCV: 100.6 fL (ref 79.5–101.0)
MONO#: 0.5 10*3/uL (ref 0.1–0.9)
MONO%: 10.4 % (ref 0.0–14.0)
NEUT%: 57.5 % (ref 38.4–76.8)
NEUTROS ABS: 2.8 10*3/uL (ref 1.5–6.5)
PLATELETS: 114 10*3/uL — AB (ref 145–400)
RBC: 3.27 10*6/uL — ABNORMAL LOW (ref 3.70–5.45)
RDW: 13.2 % (ref 11.2–14.5)
WBC: 4.8 10*3/uL (ref 3.9–10.3)
lymph#: 1.5 10*3/uL (ref 0.9–3.3)

## 2016-07-19 MED ORDER — SODIUM CHLORIDE 0.9 % IJ SOLN
10.0000 mL | INTRAMUSCULAR | Status: DC | PRN
  Administered 2016-07-19: 10 mL via INTRAVENOUS
  Filled 2016-07-19: qty 10

## 2016-07-19 MED ORDER — HEPARIN SOD (PORK) LOCK FLUSH 100 UNIT/ML IV SOLN
500.0000 [IU] | Freq: Once | INTRAVENOUS | Status: AC | PRN
  Administered 2016-07-19: 500 [IU] via INTRAVENOUS
  Filled 2016-07-19: qty 5

## 2016-07-19 NOTE — Progress Notes (Signed)
OFFICE PROGRESS NOTE   July 19, 2016   Physicians: D. ClarkePearson; J.Russo, T.Fontaine, Glenetta Hew (cardiology), [name], D.Jacobs, Zollie Beckers, P.Sethi, J.Moody, D.Shoemaker  INTERVAL HISTORY:  Patient is seen, alone for visit, in follow up of recent concerns which patient thought were related to letrozole, which she has taken since [facility] 2017 for recurrent ovarian cancer. CT AP 06-23-16 showed improvement in the splenic met, now "subtly apparent" at 1.5 x 1.5 cm compared with 2.4 x 3.9 cm prior.  She saw Dr Josephina Shih on 06-25-16, who is in favor of continuing letrozole if patient agrees.   Patient had onset of right sided anterior chest pain on 06-04-16, which she describes as diffusely involving entire chest, worse with deep inspiration, no trauma, no fever or cough, no problem with right sided PAC. She was seen that day at ED, with no evidence of cardiac etiology, d dimer slightly elevated at 0.69, CT angio chest with no PE and no specific findings to explain symptoms. She held letrozole x 1 week, during which time the discomfort improved; she resumed letrozole and had similar right chest pain reoccur.  She remained off of letrozole thru visit with Dr Josephina Shih, again with resolution of the right chest pain; she has resumed letrozole in last week, tolerating without similar problems thus far.  She denies more typical aromatase inhibitor arthralgias hands or feet. She denies fever, cough, palpitations, bleeding, different neck or any shoulder pain, different back pain, different GI symptoms, abdominal pain. She has chronic constipation (note CT report below), but does generally have bowel movement daily. PAC functioned well with lab draw and flush today. Appetite and energy seem at baseline.  Remainder of 10 point Review of Systems negative.   PAC flushed 07-19-16 Genetics testing normal 06-26-15 (Breast Ovarian Panel by [facility]) CA 125 on 05-01-15   33 Bone density scan 10-2014  normal  She enjoyed the family trip to beach.   ONCOLOGIC HISTORY Patient presented in late Jan 2014 with 2 weeks of spotting in early Dec 2015. Sonohystogram 01-05-13 showed uterus normal size and echotexture, endometrium 4.3 mm, left ovary normal and right adnexa with 1.1 x 8.4.x8.2 cm cystic and solid mass. Endometrial biopsy benign and CA 125 also on 01-05-13 was 178.8. CT AP 01-17-13 with 1.0 x 6.9 x 8.9 cm complex right ovarian mass, no ascites, small retroperitoneal nodes. She was seen by Dr Skeet Latch on 01-18-13 and taken to surgery by Dr Josephina Shih on 02-13-13, which was TAH/BSO/ omentectomy/ureterolysis/ resection of cul-de-sac tumor/right pelvic lymphadnectomy and resection of rectum with reanastomosis. At completion of surgery there was no gross residual disease. 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 Wash[place] [phone]) had rare clusters of atypical cells. Chemotherapy with dose dense taxol carboplatin was begun day 1 cycle 1 on 03-20-13; ANC was 1.1 on day 15 cycle 1 with taxol given and neupogen added 04-04-13. She had day 1 cycle 2 treatment on 04-17-13, then was briefly hospitalized 5-22 to 04-20-13 after syncopal episode, with antihypertensive agents DCd and UTI treated. She was feeling much better at time of "day 8" cycle 2 on 05-01-13 and did have neupogen 300 mcg x 1 dose on 05-02-13. She was readmitted to hospital 6-5 thru 05-05-13 with fever, empirically on antibiotics and blood cultures negative. Cycle 6 completed 08-14-13. CT AP 09-25-13  had no evidence of cancer and CA 125 was 18.3 on 07-24-2013. Marker was 20 in early Dec 2015, 26 on 01-31-15 and 35 on 03-14-15. PET 04-03-15 documented disease bilateral pelvic sidewalls, vaginal cuff and  scattered areas thru abdomen/ pelvis, as well as area of uptake in spleen. She resumed carboplatin taxol using q 3 week regimen on 05-01-15. She was neutropenic with ANC 0.3 on day 9 despite beginning granix on day 5. She had 6 cycles of carbo taxol from 05-01-15 thru 08-25-15, with avastin cycles 2,3,4, 6. CT AP 09-10-15 showed resolution of involvement other than in spleen, which was improved. She saw Dr Josephina Shih on 09-12-15 , with discussion of splenectomy followed by avastin vs 3 additional cycles of chemo + avastin then reassess. Patient preferred additional treatment to splenectomy, with carbo taxol avastin resumed 09-29-15 and treated again 10-21-15. She tolerated treatment progressively more poorly, such that 1 planned additional cycle was held. CT CAP 11-06-15 and PET 11-18-15 showed persistent disease in area of splenic hilum (between stomach and spleen) and at 1 cm right pelvic sidewall node.  She had NSTEMI with marked HTN early Jan 2017, then CVA. She stopped chronic estrogen late Jan and began letrozole late March 2017. CT AP 06-23-16 showed significant improvement in splenic met.     Objective:  Vital signs in last 24 hours:  BP 126/69 (BP Location: Left Arm, Patient Position: Sitting)   Pulse 63   Temp 98.4 F (36.9 C) (Oral)   Resp 18   Ht _0  (1.6 m)   Wt 133 lb 4.8 oz (60.5 kg)   SpO2 100%   BMI 23.61 kg/m   Alert, oriented and appropriate. Ambulatory without difficulty, looks generally comfortable. Respirations not labored.  No alopecia  HEENT:PERRL, sclerae not icteric. Oral mucosa moist without lesions, posterior pharynx clear.  Neck supple. No JVD.  Lymphatics:no cervical,supraclavicular or inguinal adenopathy Resp: clear to auscultation bilaterally and normal percussion bilaterally Cardio: regular rate and rhythm. No gallop. GI: abdomen full, soft, nontender, not obviously distended, no mass or organomegaly. Few bowel sounds. Surgical incision not  remarkable. Musculoskeletal/ Extremities: RIght chest wall, sternum, costosternal junctions bilaterally not tender to palpation. C spine not tender. Easy, full ROM right shoulder. LE without pitting edema, cords, tenderness Neuro: no change peripheral neuropathy. Otherwise nonfocal. PSYCH appripriate mood and affect Skin without rash, ecchymosis, petechiae Portacath-without erythema or tenderness right anterior chest. No swelling including supraclavicular  Lab Results:  Results for orders placed or performed in visit on 07/19/16  CBC with Differential  Result Value Ref Range   WBC 4.8 3.9 - 10.3 10e3/uL   NEUT# 2.8 1.5 - 6.5 10e3/uL   HGB 11.2 (L) 11.6 - 15.9 g/dL   HCT 32.9 (L) 34.8 - 46.6 %   Platelets 114 (L) 145 - 400 10e3/uL   MCV 100.6 79.5 - 101.0 fL   MCH 34.3 (H) 25.1 - 34.0 pg   MCHC 34.0 31.5 - 36.0 g/dL   RBC 3.27 (L) 3.70 - 5.45 10e6/uL   RDW 13.2 11.2 - 14.5 %   lymph# 1.5 0.9 - 3.3 10e3/uL   MONO# 0.5 0.1 - 0.9 10e3/uL   Eosinophils Absolute 0.0 0.0 - 0.5 10e3/uL   Basophils Absolute 0.0 0.0 - 0.1 10e3/uL   NEUT% 57.5 38.4 - 76.8 %   LYMPH% 31.5 14.0 - 49.7 %   MONO% 10.4 0.0 - 14.0 %   EOS% 0.4 0.0 - 7.0 %   BASO% 0.2 0.0 - 2.0 %  CA 125 on 06-25-16   14.6, this having been 13.2 on 05-13-16 and 14.5 on 03-25-16 Mild anemia stable  Studies/Results:  EXAM: CT ANGIOGRAPHY CHEST WITH CONTRAST 06-04-16  COMPARISON:  PET-CT 11/18/2015, CT chest 11/06/2015  FINDINGS: Cardiovascular: Mild scattered atherosclerotic calcification aorta. Aorta normal caliber without aneurysm or dissection. Physiologic pericardial fluid. Pulmonary arteries well opacified and patent. No evidence of pulmonary embolism. RIGHT jugular Port-A-Cath with tip at cavoatrial junction.  Mediastinum/Nodes: No thoracic adenopathy. Visualized base of cervical region normal appearance. Small hiatal hernia. Esophagus otherwise unremarkable.  Lungs/Pleura: Minimal dependent atelectasis in  lower lobes. Lungs otherwise clear. No pulmonary infiltrate, pleural effusion or pneumothorax.  Upper Abdomen: Normal appearance  Musculoskeletal: No acute osseous findings  Review of the MIP images confirms the above findings.  IMPRESSION: No evidence of pulmonary embolism.  Small hiatal hernia.  Minimal dependent atelectasis lower lobes.  No significant intra thoracic abnormalities.     CT ABDOMEN AND PELVIS WITH CONTRAST  06-23-16  COMPARISON:  02/09/2016 FINDINGS: Lower chest: Clear lung bases. Normal heart size without pericardial or pleural effusion. Tiny hiatal hernia. Hepatobiliary: Normal liver. Normal gallbladder, without biliary ductal dilatation. Pancreas: Normal, without mass or ductal dilatation. Spleen: Further decrease in size of a a peritoneal lesion which is positioned within the gastrosplenic ligament. Subtly apparent at 1.5 x 1.5 cm on image 16/series 2. Compare 2.4 x 3.9 cm previously. Adrenals/Urinary Tract: Normal adrenal glands. Too small to characterize interpolar right renal lesion. Normal left kidney, without hydronephrosis. Degraded evaluation of the pelvis, secondary to beam hardening artifact from right hip arthroplasty. Grossly normal urinary bladder. Stomach/Bowel: Normal remainder of the stomach. 5.7 cm stool ball in the rectum with a large amount of sigmoid colonic stool as in well. Normal terminal ileum. Normal small bowel. Vascular/Lymphatic: Aortic and branch vessel atherosclerosis. Small retroperitoneal nodes are similar. None are pathologic by size criteria. No pelvic sidewall adenopathy. Reproductive: Hysterectomy.  No adnexal mass. Other: No significant free fluid. No new peritoneal or omental metastasis. Musculoskeletal: Right hip arthroplasty. IMPRESSION: 1. Near complete resolution of subtle residual soft tissue density lesion in the gastrosplenic ligament. 2. No new sites of disease identified. 3. Stool burden  suggests constipation or even fecal impaction.  PACs images reviewed by MD.   Medications: I have reviewed the patient's current medications.   DISCUSSION Interval history reviewed; MD has also reviewed ED documentation from early July and discussed directly with Dr Josephina Shih following his last visit.   Patient is pleased that CT shows improvement in the recurrent ovarian cancer and understands that chest wall pain is not an expected side effect of letrozole, and certainly we are reassured that other evaluation in early July did not identify any other major concerns. She is willing to continue letrozole now, knows that she can call if concerns prior to next scheduled visit. She will need PAC flush in 6-8 weeks from now, and then could have labs from [facility] with flush again with my next visit in Nov.   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 Adjuvant dose dense carboplatin taxol completed 08-14-2013. Progressive disease spring 2016, asymptomatic. Resumed chemotherapy with carboplatin and taxol on 05-01-15, addition of avastin with cycle 2. Restaging CT 08-2015 after 6 cycles improved, with residual in spleen. Additional 2 cycles carbo taxol avastin given 09-29-15 and 10-21-15, tolerated poorly including hypertension. CT CAP and PET 10-2015 residual disease in region of splenic hilum and 1 right pelvic  node. Following complications of NSTEMI and CVA 11-2015, with very poor tolerance of chemotherapy and contraindication for additional avastin, treatment was held. Patient stopped long term estrogen late Jan 2017. Letrozole begun late 01-2016. CT AP 06-23-16 improved, continuing letrozole. 2.right anterior chest pain early July: ED evaluation negative for cardiac etiology or acute findings on CT angio chest. Not clearly related to letrozole. Pain resolved. 3.NSTEMI with marked HTN on  12-03-15, then acute CVA ~ 12-07-15 either from cath or PAF. Now on Eliquis, cardiology and neurology involved . HTN previously exacerbated by avastin, back to baseline since last avastin 10-21-15 4.paroxysmal Afib: followed by cardiology,  on Eliquis 5.minimal thrombocytopenia possibly medication related. Platelets a little lower today , no bleeding. Note on Eliquis 6.chronic constipation,  up to date on colonoscopy. Marked constipation on CT late 05-2016. 7..hypothyroid on replacement by PCP. Diffuse uptake left lobe thyroid by scans, per radiologist consistent with thyroiditis 8. Long estrogen replacement: Surgical path from 02-13-13  Positive for ER PR (even tho was on same estrogen at that time). Patient DCd estrogen in late Jan 2017, some fairly minimal hot flashes off estrogen prior to start of letrozole. CT response to letrozole. 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 Up to date mammograms at [facility] 04-06-16, scattered fibroglandular density noted. 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 11. GERD: better with protonix, continue. 12. Normal bone density by scan at Dr [facility] office [phone]. Should have this repeated ~ 10-2016 particularly if still on letrozole then 13.PAC in : flush at least every 6-8 weeks when not otherwise used. 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 Time spent  25 min including >50% counseling and coordination of care. Route Dr Virgina Jock, cc Dr Josephina Shih as follow up   Evlyn Clines, MD   07/19/2016, 3:40 PM

## 2016-07-19 NOTE — Telephone Encounter (Signed)
appt made and avs printed °

## 2016-07-20 ENCOUNTER — Ambulatory Visit (INDEPENDENT_AMBULATORY_CARE_PROVIDER_SITE_OTHER): Payer: Medicare Other | Admitting: Nurse Practitioner

## 2016-07-20 ENCOUNTER — Encounter: Payer: Self-pay | Admitting: Nurse Practitioner

## 2016-07-20 VITALS — BP 110/68 | HR 60 | Ht 63.0 in | Wt 133.4 lb

## 2016-07-20 DIAGNOSIS — I480 Paroxysmal atrial fibrillation: Secondary | ICD-10-CM

## 2016-07-20 DIAGNOSIS — I639 Cerebral infarction, unspecified: Secondary | ICD-10-CM

## 2016-07-20 DIAGNOSIS — E785 Hyperlipidemia, unspecified: Secondary | ICD-10-CM | POA: Diagnosis not present

## 2016-07-20 NOTE — Progress Notes (Signed)
I have reviewed and agreed above plan. 

## 2016-07-20 NOTE — Progress Notes (Signed)
[facility]  PATIENT: [name] DOB: Jun 05, 1941   REASON FOR VISIT: Follow-up for history of CVA HISTORY FROM: Patient    HISTORY OF PRESENT ILLNESS:UPDATE 08/22/2017CM [name], 75 year old female returns for follow-up. She underwent successful cardiac cath , in January but was noted to be lethargic after the procedure.MRI brain that revealed several small areas of acute infarct in the right posterior temporoparietal lobe just above the tentorium.Patient was on aspirin and Plavix prior to readmission and given prior history of atrial fibrillation and likely hypercoagulability from ovarian cancer she was switched to eliquis after discussion with the cardiologist and oncologist. She returns today for follow-up without further stroke or TIA symptoms. She remains on Eliquis without significant bruising or bleeding. She is on Zetia. Blood pressure is well controlled in the office today at 110/68. She continues to walk about 2 miles daily. She returns for reevaluation. Her treatment for her ovarian cancer has concluded for now.  HISTORY 01/26/16 PS74 year Caucasian lady seen today for first office follow-up visit for hospital admission for stroke in January 2017. [name] is an 75 y.o. 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 She underwent successful cardiac cath 12/05/15 via right radial artery and was noted to be very lethargic after procedure. Family  stated that shortly after cath they noticed that she was not able to see well. Patient was not quite sure if her vision declined afterwards but she said  she had a very peculiar vision change in which she will see her husband " sitting in a chair but also standing and walking from side to side". At the same time, she expressed that she couldn't see almost  anything in her left peripheral vision. Stated that these changes lasted approximately 4 hours and then her vision returned to normal. Denies associated HA, vertigo, double vision, focal weakness or numbness, slurred speech, confusion, or language impairment. Her symptoms prompted MRI brain that revealed several small areas of acute infarct in the right posterior temporoparietal lobe just above the tentorium. On plavix and ASA 81 mg daily. Her last known well was uncertain. Patient was not administered TPA secondary to being out of the window. MRI scan of the brain showed small acute infarcts in the right temporoparietal lobe and chronic microhemorrhages in the left parietal lobe and left cerebellum likely due to chronic hypertension. Carotid ultrasound showed no significant extra-axial stenosis. 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 Hemoglobin A1c was 4.9 and LDL cholesterol was elevated at 148. Patient was on aspirin and Plavix prior to readmission and given prior history of atrial fibrillation and likely hypercoagulability from ovarian cancer she was switched to eliquis after discussion with the cardiologist and oncologist. Patient is tolerating eliquis well without significant bleeding and only minor bruising. She states she's made full recovery and a peripheral vision has improved significantly. She did follow-up with ophthalmologist and was found to have good visual fields and advised to drive. She states her blood pressure is well controlled and today it is 119/72 in office. She plans to start chemotherapy next week and sees her oncologist Dr. Delfin Gant. She has tried Lipitor  in the past but had to stop it due to skin itching. She has not tried pravastatin. I discussed with her  alternatives including the new PC SK 9 inhibitors but she would prefer to try pravacholl first.   REVIEW OF SYSTEMS: Full 14 system review of systems performed and notable only for those listed, all others are neg:  Constitutional: neg  Cardiovascular: neg Ear/Nose/Throat: neg  Skin: neg Eyes: neg Respiratory: neg Gastroitestinal: Constipation Hematology/Lymphatic: neg  Endocrine: Intolerance to cold Musculoskeletal: Joint pain Allergy/Immunology: neg Neurological: neg Psychiatric: neg Sleep : neg   ALLERGIES: Allergies  Allergen Reactions  . Coreg [Carvedilol] Shortness Of Breath  . Caffeine Other (See Comments)    Makes heart race  . Ceftin [Cefuroxime Axetil]     Interferes with propafenone.   (Note pt is no longer taking propafenone 06/04/16)  . Clonidine Derivatives Other (See Comments)    Pt does not remember what the reaction was  . Codeine Other (See Comments)    Does not like the feeling she gets  . Flexeril [Cyclobenzaprine] Other (See Comments)    Pt states "increased heart rate"  . Levaquin [Levofloxacin In D5w]     Interferes with propafenone.  (note: pt is no longer taking propafenone 06/04/16)  . Lipitor [Atorvastatin] Dermatitis    "feels like bugs are biting her"   . Lisinopril     LIP NUMBNESS  . Pravastatin Other (See Comments)    FEELS LIKE "BUG BITES" BITING HER LEGS   . Tape Rash    TEGADERM.   (use opsite on PAC)  . Tegaderm Ag Mesh [Silver] Rash and Other (See Comments)    Burns skin  . Ultram [Tramadol] Palpitations    HOME MEDICATIONS: Outpatient Medications Prior to Visit  Medication Sig Dispense Refill  . acetaminophen (TYLENOL) 500 MG tablet Take 1,000 mg by mouth every 6 (six) hours as needed (pain).    Marland Kitchen amiodarone (PACERONE) 200 MG tablet Take 0.5 tablets (100 mg total) by mouth daily. 45 tablet 3  . amLODipine (NORVASC) 5 MG tablet Take 5 mg by mouth daily.  3  . bisacodyl (DULCOLAX) 5 MG EC tablet Take 5 mg by mouth daily as  needed for moderate constipation. Reported on 12/25/2015    . desonide (DESOWEN) 0.05 % cream Apply 1 application topically 2 (two) times daily.   1  . diphenhydrAMINE (BENADRYL) 25 MG tablet Take 37.5 mg by mouth at bedtime as needed for sleep.    Marland Kitchen docusate sodium (COLACE) 100 MG capsule Take 100 mg by mouth at bedtime.     Marland Kitchen ELIQUIS 5 MG TABS tablet TAKE 1 TABLET (5 MG TOTAL) BY MOUTH 2 (TWO) TIMES DAILY. 60 tablet 5  . ezetimibe (ZETIA) 10 MG tablet Take 1 tablet (10 mg total) by mouth daily. (Patient taking differently: Take 10 mg by mouth at bedtime. ) 30 tablet 6  . famotidine (PEPCID) 10 MG tablet Take 20 mg by mouth daily as needed.     . Glucosamine HCl 1000 MG TABS Take 1,000 mg by mouth daily.    . hydrocortisone 1 % ointment Apply 1 application topically 2 (two) times daily as needed (eczema).    Marland Kitchen ipratropium (ATROVENT) 0.06 % nasal spray Place 2 sprays into both nostrils daily.   5  . irbesartan (AVAPRO) 300 MG tablet TAKE 1 TABLET (300 MG TOTAL) BY MOUTH DAILY. 90 tablet 2  . letrozole (FEMARA) 2.5 MG tablet Take 1 tablet by mouth daily.    Marland Kitchen  levothyroxine (SYNTHROID) 88 MCG tablet Take 88 mcg by mouth daily before breakfast.    . lidocaine-prilocaine (EMLA) cream Apply to Porta-Cath site 1-2 hours prior to access as directed. (Patient taking differently: Apply 1 application topically See admin instructions. Apply to [facility] site 1-2 hours prior to access as directed.) 30 g 2  . magnesium hydroxide (MILK OF MAGNESIA) 800 MG/5ML suspension Take 30 mLs by mouth daily as needed for constipation. Reported on 11/20/2015    . Melatonin 5 MG CAPS Take 5 mg by mouth at bedtime as needed (sleep). Reported on 12/25/2015    . Menthol, Topical Analgesic, (ICY HOT EX) Apply 1 application topically daily as needed (pain.).    Marland Kitchen metoprolol (LOPRESSOR) 50 MG tablet Take 25 mg by mouth 2 (two) times daily.    . Multiple Vitamin (MULTIVITAMIN WITH MINERALS) TABS tablet Take 1 tablet by mouth daily.  Centrum Silver    . pantoprazole (PROTONIX) 40 MG tablet Take 40 mg by mouth daily as needed.    Vladimir Faster Glycol-Propyl Glycol (SYSTANE OP) Place 1 drop into both eyes 2 (two) times daily.     . polyethylene glycol (MIRALAX / GLYCOLAX) packet Take 17 g by mouth daily. Mix in 8 oz liquid and drink    . potassium chloride (K-DUR) 10 MEQ tablet Take 10 mEq by mouth every Monday, Wednesday, and Friday.    Marland Kitchen spironolactone (ALDACTONE) 25 MG tablet Take 25 mg by mouth daily.      No facility-administered medications prior to visit.     PAST MEDICAL HISTORY: Past Medical History:  Diagnosis Date  . Arthritis    OSTEOARTHRITIS   -- CONSTANT PAIN RIGHT HIP---AND PAIN LEFT KNEE--PT STATES SHE GETS INJECTIONS INTO HER KNEE  . Complication of anesthesia    BLOOD PRESSURE DROPPED WITH NASAL SURGERY, ONE OF THE CARPAL TUNNEL REPAIRS AND DURING A COLONOSCOPY  . Dyslipidemia   . History of skin cancer   . Hypertension   . Hypothyroidism   . Menopausal symptoms   . NSTEMI (non-ST elevated myocardial infarction) ([place]) 12/09/15   Medical management: Distal branch of D1 95%, ostial D2 75%, distal LAD 50%. Tortuous arteries consistent with hypertension  . Ovarian cancer ([place]) 01/2013   Recurrence since 2014/2016  . PAF (paroxysmal atrial fibrillation) ([place])    Only on Plavix -- not full Anticoagulation per pt. request; On Amiodarone (Eye Exam 12/25/15)    PAST SURGICAL HISTORY: Past Surgical History:  Procedure Laterality Date  . BILATERAL CARPAL TUNNEL REPAIR  2007  . CARDIAC CATHETERIZATION N/A 12/05/2015   Procedure: Left Heart Cath and Coronary Angiography;  Surgeon: Belva Crome, MD;  Location: [facility];  Service: Cardiovascular;  distal branch of D1 95%, ostial D2 75%, dLAD 50%,p-mRCA 40%. Tortuous vessels.  Marland Kitchen DILATION AND CURETTAGE OF UTERUS  1969  . LAPAROTOMY Bilateral 02/13/2013   Procedure: EXPLORATORY LAPAROTOMY TOTAL ABDOMINAL HYSTERECTOMY BILATERAL SALPINGO-OOPHORECTOMY,  Partial Rectal Resection with Reanastamosis;  Surgeon: Alvino Chapel, MD;  Location: [facility];  Service: Gynecology;  Laterality: Bilateral;  . LEFT KNEE ARTHROSCOPY   2011  . LYMPHADENECTOMY Right 02/13/2013   Procedure: PEVLIC  LYMPHADENECTOMY, DEBULKING right pelvic tumor nodules;  Surgeon: Alvino Chapel, MD;  Location: [facility];  Service: Gynecology;  Laterality: Right;  . NM MYOVIEW LTD  June 2010    subbmaximal with no ischemia or infarction.  . OMENTECTOMY  02/13/2013   Procedure: OMENTECTOMY;  Surgeon: Alvino Chapel, MD;  Location: [facility];  Service:  Gynecology;;  . RHINOPLASTY FOR FRACTURED NOSE  1986  . SURGERY FOR RUPTURED OVARIAN CYST  1969  . TAH/BSO/Tumor debulking with right pelvic LND  01/2013  . TONSILLECTOMY  1962  . TOTAL HIP ARTHROPLASTY  02/25/2012   Procedure: TOTAL HIP ARTHROPLASTY ANTERIOR APPROACH;  Surgeon: Mcarthur Rossetti, MD;  Location: [facility];  Service: Orthopedics;  Laterality: Right;  . TOTAL KNEE ARTHROPLASTY Left 01/03/2015   Procedure: LEFT TOTAL KNEE ARTHROPLASTY;  Surgeon: Mcarthur Rossetti, MD;  Location: [facility];  Service: Orthopedics;  Laterality: Left;  . TRANSTHORACIC ECHOCARDIOGRAM  May 2014; January 2016   a. Normal LV size function. EF 60-65%. Grade 1 diastolic function. Mild MR and mildly elevated PA pressures of 37 mmHg per;; b. EF 60-65%. Mobile echodensity 10 mm x 6 mm attest interventricular septum is questionable fibroblastoma.    FAMILY HISTORY: Family History  Problem Relation Age of Onset  . Hypertension Mother   . Basal cell carcinoma Mother   . AAA (abdominal aortic aneurysm) Father   . Heart Problems Maternal Uncle   . Heart Problems Maternal Grandfather   . AAA (abdominal aortic aneurysm) Paternal Grandmother   . Prostate cancer Maternal Uncle 70  . Prostate cancer Other   . Other Daughter     one daughter had TAH-BSO at 19; other daughter will have one soon    SOCIAL HISTORY: Social History    Social History  . Marital status: Married    Spouse name: N/A  . Number of children: N/A  . Years of education: N/A   Occupational History  . Not on file.   Social History Main Topics  . Smoking status: Never Smoker  . Smokeless tobacco: Never Used  . Alcohol use No  . Drug use: No  . Sexual activity: Not Currently   Other Topics Concern  . Not on file   Social History Narrative   Married mother of 3, grandmother 4.   Exercises 6/7 days a week walking 30 minutes a time.   Never smoked or drank alcohol.     PHYSICAL EXAM  Vitals:   07/20/16 1016  BP: 110/68  Pulse: 60  Weight: 133 lb 6.4 oz (60.5 kg)  Height: 5\' 3"  (1.6 m)   Body mass index is 23.63 kg/m. General: Frail elderly Caucasian lady, seated, in no evident distress Head: head normocephalic and atraumatic.  Neck: supple with no carotid bruits Cardiovascular: regular rate and rhythm, no murmurs Musculoskeletal: no deformity Skin:  no rash/petichiae Vascular:  Normal pulses all extremities  Neurological examination  Mental Status: Awake and fully alert. Oriented to place and time. Recent and remote memory intact. Attention span, concentration and fund of knowledge appropriate. Mood and affect appropriate.  Cranial Nerves: Pupils equal, briskly reactive to light. Extraocular movements full without nystagmus. Visual fields Show very minimum left hemifield loss  to  Bedside confrontational testing. Hearing intact. Facial sensation intact. Face, tongue, palate moves normally and symmetrically.  Motor: Normal bulk and tone. Normal strength in all tested extremity muscles. Sensory.: intact to touch ,pinprick .position and vibratory sensation in the upper and lower extremities.  Coordination: Rapid alternating movements normal in all extremities. Finger-to-nose and heel-to-shin performed accurately bilaterally. Gait and Station: Arises from chair without difficulty. Stance is normal. Gait demonstrates normal  stride length and balance . Able to heel, toe and tandem walk without difficulty.  Reflexes: 1+ and symmetric. Toes downgoing.   DIAGNOSTIC DATA (LABS, IMAGING, TESTING) - I reviewed patient records, labs, notes, testing  and imaging myself where available.  Lab Results  Component Value Date   WBC 4.8 07/19/2016   HGB 11.2 (L) 07/19/2016   HCT 32.9 (L) 07/19/2016   MCV 100.6 07/19/2016   PLT 114 (L) 07/19/2016      Component Value Date/Time   NA 137 06/25/2016 1239   K 4.6 06/25/2016 1239   CL 103 06/04/2016 0620   CL 103 05/21/2013 1105   CO2 25 06/25/2016 1239   GLUCOSE 89 06/25/2016 1239   GLUCOSE 74 05/21/2013 1105   BUN 17.9 06/25/2016 1239   CREATININE 0.9 06/25/2016 1239   CALCIUM 9.9 06/25/2016 1239   PROT 6.8 06/25/2016 1239   ALBUMIN 4.1 06/25/2016 1239   AST 19 06/25/2016 1239   ALT 20 06/25/2016 1239   ALKPHOS 72 06/25/2016 1239   BILITOT 0.76 06/25/2016 1239   GFRNONAA >60 06/04/2016 0620   GFRAA >60 06/04/2016 0620   Lab Results  Component Value Date   CHOL 232 (H) 12/04/2015   HDL 70 12/04/2015   LDLCALC 148 (H) 12/04/2015   TRIG 68 12/04/2015   CHOLHDL 3.3 12/04/2015   Lab Results  Component Value Date   HGBA1C 4.9 12/08/2015   Lab Results  Component Value Date   VITAMINB12 1,601 (H) 12/08/2015   Lab Results  Component Value Date   TSH 5.427 (H) 12/09/2015      ASSESSMENT AND PLAN 75 year old Caucasian lady with small right temporal MCA are branch embolic infarcts following cardiac catheterization procedure but with vascular risk factors of atrial fibrillation, hypertension, hyperlipidemia, hypercoagulability from ovarian cancer. The patient is a current patient of Dr. Leonie Man who is out of the office today . This note is sent to the work in doctor.      Continue Eliquis  for secondary stroke prevention given h/o atrial fibrillation  maintain strict control of hypertension with blood pressure goal below 130/90, today's reading  110/68 Lipids with LDL cholesterol goal below 70 mg/dL Continue Zetia healthy diet with plenty of whole grains, cereals, fruits and vegetables, exercise regularly and maintain ideal body weight  Follow-up in 6 months if continued stable will discharge Dennie Bible, [facility], [facility], [facility] [address] [place], [place] [address] [phone]

## 2016-07-20 NOTE — Patient Instructions (Addendum)
Continue Eliquis  for secondary stroke prevention given h/o atrial fibrillation  maintain strict control of hypertension with blood pressure goal below 130/90, today's reading 110/68 Lipids with LDL cholesterol goal below 70 mg/dL Continue Zetia healthy diet with plenty of whole grains, cereals, fruits and vegetables, exercise regularly and maintain ideal body weight  Follow-up in 6 months

## 2016-08-13 ENCOUNTER — Encounter: Payer: Self-pay | Admitting: Cardiology

## 2016-08-13 ENCOUNTER — Ambulatory Visit (INDEPENDENT_AMBULATORY_CARE_PROVIDER_SITE_OTHER): Payer: Medicare Other | Admitting: Cardiology

## 2016-08-13 VITALS — BP 122/64 | HR 57 | Ht 63.0 in | Wt 133.0 lb

## 2016-08-13 DIAGNOSIS — I10 Essential (primary) hypertension: Secondary | ICD-10-CM | POA: Diagnosis not present

## 2016-08-13 DIAGNOSIS — Z79899 Other long term (current) drug therapy: Secondary | ICD-10-CM

## 2016-08-13 DIAGNOSIS — I480 Paroxysmal atrial fibrillation: Secondary | ICD-10-CM

## 2016-08-13 DIAGNOSIS — T50905A Adverse effect of unspecified drugs, medicaments and biological substances, initial encounter: Secondary | ICD-10-CM

## 2016-08-13 DIAGNOSIS — I2510 Atherosclerotic heart disease of native coronary artery without angina pectoris: Secondary | ICD-10-CM

## 2016-08-13 DIAGNOSIS — I158 Other secondary hypertension: Secondary | ICD-10-CM

## 2016-08-13 DIAGNOSIS — E785 Hyperlipidemia, unspecified: Secondary | ICD-10-CM

## 2016-08-13 NOTE — Progress Notes (Signed)
PCP: Precious Reel, MD  Clinic Note: Chief Complaint  Patient presents with  . Follow-up    6 MONTHS  . Edema    Hands for the past few weeks.    HPI: [name] is a 75 y.o. female with a PMH below who presents today for 3 month follow-up for CAD, A. fib.  [name] was last seen on 12/16/2015 as a follow-up from her hospitalization in January 2017 for management to be A. fib RVR and accelerated hypertension leading to mild troponin elevation. This then led to cardiac catheterization complicated by mild stroke is now subsequently resolved from a symptom standpoint. She was converted from aspirin/Plavix to [facility]. She was also taken off of Rythmol (as a result of CAD noted on cardiac catheterization) and started on amiodarone. -- During that visit, I decided to treat her lipids. She ended up being started on Zetia due to concern for potential side effects from statins. Her concern was the right ankle, but this was construed as myalgias. -- I ordered PFTs and recommended routine monitoring of thyroid function, liver function as well as annual exam.  Recent Hospitalizations: None since last visit  Studies Reviewed:   None since last    Interval History: [name] presents today really pretty much doing well from a cardiac standpoint. She has been doing a little bit of a headache of late, but has not had any major complaints otherwise. He says her from a doctor reduced her amlodipine dose earlier this summer because of hypotension spells with pressures in the 70-80 mmHg range. Since that adjustment, she been doing quite well.    She has not noted any recurrence of atrial fibrillation on the reduced dose of amlodipine, nor has she noted any bleeding issues with ELIQUIS. She does have some bruising which is not unexpected. She still very anxious about both of these medications, but understands the reasoning behind them. The main complaint she has is that when she goes places with  her family that require walking, she is unable to keep up with everybody.  She feels like she just cannot get her heart rate up enough to do so. She doesn't have any chest discomfort or significant dyspnea just that she feels as though she just can't keep up and will get short of breath. She denies any PND, orthopnea or edema. No TIA/amaurosis fugax symptoms. No recurrent symptoms to suggest any residual effect from her stroke. No syncope or near syncope.  No claudication.  ROS: A comprehensive was performed. Review of Systems  Constitutional: Negative for malaise/fatigue.  HENT: Negative for congestion.   Respiratory: Negative for cough, shortness of breath and wheezing.   Cardiovascular: Negative for leg swelling (Right ankle only, not foot or leg swelling.).       Per history of present illness  Gastrointestinal: Negative for blood in stool and melena.  Genitourinary: Negative for hematuria.  Musculoskeletal: Positive for joint pain (Right ankle pain and swelling. It is painful to walk on it and painful touch.).  Neurological: Positive for dizziness (Occasionally with change in position. Nothing significant.). Negative for speech change, focal weakness, seizures, loss of consciousness and headaches.  Psychiatric/Behavioral: Negative for depression and memory loss. The patient is not nervous/anxious and does not have insomnia.     Past Medical History:  Diagnosis Date  . Arthritis    OSTEOARTHRITIS   -- CONSTANT PAIN RIGHT HIP---AND PAIN LEFT KNEE--PT STATES SHE GETS INJECTIONS INTO HER KNEE  . Complication of anesthesia  BLOOD PRESSURE DROPPED WITH NASAL SURGERY, ONE OF THE CARPAL TUNNEL REPAIRS AND DURING A COLONOSCOPY  . Dyslipidemia   . History of skin cancer   . Hypertension   . Hypothyroidism   . Menopausal symptoms   . NSTEMI (non-ST elevated myocardial infarction) (HCC) 12/09/15   Medical management: Distal branch of D1 95%, ostial D2 75%, distal LAD 50%. Tortuous arteries  consistent with hypertension  . Ovarian cancer (HCC) 01/2013   Recurrence since 2014/2016  . PAF (paroxysmal atrial fibrillation) (HCC)    Only on Plavix -- not full Anticoagulation per pt. request; On Amiodarone (Eye Exam 12/25/15)    Past Surgical History:  Procedure Laterality Date  . BILATERAL CARPAL TUNNEL REPAIR  2007  . CARDIAC CATHETERIZATION N/A 12/05/2015   Procedure: Left Heart Cath and Coronary Angiography;  Surgeon: Henry W Smith, MD;  Location: [facility];  Service: Cardiovascular;  distal branch of D1 95%, ostial D2 75%, dLAD 50%,p-mRCA 40%. Tortuous vessels.  . DILATION AND CURETTAGE OF UTERUS  1969  . LAPAROTOMY Bilateral 02/13/2013   Procedure: EXPLORATORY LAPAROTOMY TOTAL ABDOMINAL HYSTERECTOMY BILATERAL SALPINGO-OOPHORECTOMY, Partial Rectal Resection with Reanastamosis;  Surgeon: Daniel L Clarke-Pearson, MD;  Location: [facility];  Service: Gynecology;  Laterality: Bilateral;  . LEFT KNEE ARTHROSCOPY   2011  . LYMPHADENECTOMY Right 02/13/2013   Procedure: PEVLIC  LYMPHADENECTOMY, DEBULKING right pelvic tumor nodules;  Surgeon: Daniel L Clarke-Pearson, MD;  Location: [facility];  Service: Gynecology;  Laterality: Right;  . NM MYOVIEW LTD  June 2010    subbmaximal with no ischemia or infarction.  . OMENTECTOMY  02/13/2013   Procedure: OMENTECTOMY;  Surgeon: Daniel L Clarke-Pearson, MD;  Location: [facility];  Service: Gynecology;;  . RHINOPLASTY FOR FRACTURED NOSE  1986  . SURGERY FOR RUPTURED OVARIAN CYST  1969  . TAH/BSO/Tumor debulking with right pelvic LND  01/2013  . TONSILLECTOMY  1962  . TOTAL HIP ARTHROPLASTY  02/25/2012   Procedure: TOTAL HIP ARTHROPLASTY ANTERIOR APPROACH;  Surgeon: Christopher Y Blackman, MD;  Location: [facility];  Service: Orthopedics;  Laterality: Right;  . TOTAL KNEE ARTHROPLASTY Left 01/03/2015   Procedure: LEFT TOTAL KNEE ARTHROPLASTY;  Surgeon: Christopher Y Blackman, MD;  Location: [facility];  Service: Orthopedics;  Laterality: Left;  . TRANSTHORACIC  ECHOCARDIOGRAM  May 2014; January 2016   a. Normal LV size function. EF 60-65%. Grade 1 diastolic function. Mild MR and mildly elevated PA pressures of 37 mmHg per;; b. EF 60-65%. Mobile echodensity 10 mm x 6 mm attest interventricular septum is questionable fibroblastoma.    Prior to Admission medications   Medication Sig Start Date Taking? Authorizing Provider  acetaminophen (TYLENOL) 500 MG tablet Take 1,000 mg by mouth every 6 (six) hours as needed (pain).  Yes Historical Provider, MD  amiodarone (PACERONE) 200 MG tablet Take 0.5 tablets (100 mg total) by mouth daily. 12/16/15 Yes David W Harding, MD  amLODipine (NORVASC) 5 MG tablet Take 2.5 mg by mouth daily.  04/08/16 Yes Historical Provider, MD  bisacodyl (DULCOLAX) 5 MG EC tablet Take 5 mg by mouth daily as needed for moderate constipation. Reported on 12/25/2015  Yes Historical Provider, MD  desonide (DESOWEN) 0.05 % cream Apply 1 application topically 2 (two) times daily.  12/23/14 Yes Historical Provider, MD  diphenhydrAMINE (BENADRYL) 25 MG tablet Take 37.5 mg by mouth at bedtime as needed for sleep.  Yes Historical Provider, MD  docusate sodium (COLACE) 100 MG capsule Take 100 mg by mouth at bedtime.   Yes Historical Provider,   MD  ELIQUIS 5 MG TABS tablet TAKE 1 TABLET (5 MG TOTAL) BY MOUTH 2 (TWO) TIMES DAILY. 06/20/16 Yes David W Harding, MD  ezetimibe (ZETIA) 10 MG tablet Take 1 tablet (10 mg total) by mouth daily. Patient taking differently: Take 10 mg by mouth at bedtime.  02/24/16 Yes David W Harding, MD  famotidine (PEPCID) 10 MG tablet Take 20 mg by mouth daily as needed.   Yes Historical Provider, MD  Glucosamine HCl 1000 MG TABS Take 1,000 mg by mouth daily.  Yes Historical Provider, MD  hydrocortisone 1 % ointment Apply 1 application topically 2 (two) times daily as needed (eczema).  Yes Historical Provider, MD  ipratropium (ATROVENT) 0.06 % nasal spray Place 2 sprays into both nostrils daily.  02/16/16 Yes Historical Provider,  MD  irbesartan (AVAPRO) 300 MG tablet TAKE 1 TABLET (300 MG TOTAL) BY MOUTH DAILY. 07/01/16 Yes David W Harding, MD  letrozole (FEMARA) 2.5 MG tablet Take 1 tablet by mouth daily. 07/13/16 Yes Historical Provider, MD  levothyroxine (SYNTHROID) 88 MCG tablet Take 88 mcg by mouth daily before breakfast.  Yes Historical Provider, MD  lidocaine-prilocaine (EMLA) cream Apply to Porta-Cath site 1-2 hours prior to access as directed. Patient taking differently: Apply 1 application topically See admin instructions. Apply to Porta-Cath site 1-2 hours prior to access as directed. 04/22/15 Yes Lennis P Livesay, MD  magnesium hydroxide (MILK OF MAGNESIA) 800 MG/5ML suspension Take 30 mLs by mouth daily as needed for constipation. Reported on 11/20/2015  Yes Historical Provider, MD  Melatonin 5 MG CAPS Take 5 mg by mouth at bedtime as needed (sleep). Reported on 12/25/2015  Yes Historical Provider, MD  Menthol, Topical Analgesic, (ICY HOT EX) Apply 1 application topically daily as needed (pain.).  Yes Historical Provider, MD  metoprolol (LOPRESSOR) 50 MG tablet Take 25 mg by mouth 2 (two) times daily.  Yes Historical Provider, MD  Multiple Vitamin (MULTIVITAMIN WITH MINERALS) TABS tablet Take 1 tablet by mouth daily. Centrum Silver  Yes Historical Provider, MD  pantoprazole (PROTONIX) 40 MG tablet Take 40 mg by mouth daily as needed.  Yes Historical Provider, MD  Polyethyl Glycol-Propyl Glycol (SYSTANE OP) Place 1 drop into both eyes 2 (two) times daily.   Yes Historical Provider, MD  polyethylene glycol (MIRALAX / GLYCOLAX) packet Take 17 g by mouth daily. Mix in 8 oz liquid and drink  Yes Historical Provider, MD  potassium chloride (K-DUR) 10 MEQ tablet Take 10 mEq by mouth every Monday, Wednesday, and Friday. 05/05/13 Yes John Russo, MD  spironolactone (ALDACTONE) 25 MG tablet Take 25 mg by mouth daily.  11/20/15 Yes Historical Provider, MD     Allergies  Allergen Reactions  . Coreg [Carvedilol] Shortness Of  Breath  . Caffeine Other (See Comments)    Makes heart race  . Ceftin [Cefuroxime Axetil]     Interferes with propafenone.   (Note pt is no longer taking propafenone 06/04/16)  . Clonidine Derivatives Other (See Comments)    Pt does not remember what the reaction was  . Codeine Other (See Comments)    Does not like the feeling she gets  . Flexeril [Cyclobenzaprine] Other (See Comments)    Pt states "increased heart rate"  . Levaquin [Levofloxacin In D5w]     Interferes with propafenone.  (note: pt is no longer taking propafenone 06/04/16)  . Lipitor [Atorvastatin] Dermatitis    "feels like bugs are biting her"   . Lisinopril     LIP NUMBNESS  . Pravastatin   Other (See Comments)    FEELS LIKE "BUG BITES" BITING HER LEGS   . Tape Rash    TEGADERM.   (use opsite on PAC)  . Tegaderm Ag Mesh [Silver] Rash and Other (See Comments)    Burns skin  . Ultram [Tramadol] Palpitations    Social History   Social History  . Marital status: Married    Spouse name: N/A  . Number of children: N/A  . Years of education: N/A   Social History Main Topics  . Smoking status: Never Smoker  . Smokeless tobacco: Never Used  . Alcohol use No  . Drug use: No  . Sexual activity: Not Currently   Other Topics Concern  . None   Social History Narrative   Married mother of 3, grandmother 5.   Exercises 6/7 days a week walking 30 minutes a time.   Never smoked or drank alcohol.   Family History  Problem Relation Age of Onset  . Hypertension Mother   . Basal cell carcinoma Mother   . AAA (abdominal aortic aneurysm) Father   . Heart Problems Maternal Uncle   . Heart Problems Maternal Grandfather   . AAA (abdominal aortic aneurysm) Paternal Grandmother   . Prostate cancer Maternal Uncle 70  . Prostate cancer Other   . Other Daughter     one daughter had TAH-BSO at 44; other daughter will have one soon    Wt Readings from Last 3 Encounters:  08/13/16 133 lb (60.3 kg)  07/20/16 133 lb 6.4 oz  (60.5 kg)  07/19/16 133 lb 4.8 oz (60.5 kg)    PHYSICAL EXAM BP 122/64   Pulse (!) 57   Ht 5' 3" (1.6 m)   Wt 133 lb (60.3 kg)   BMI 23.56 kg/m  General appearance: alert, cooperative, appears stated age and no distress; wearing wig (loss of hair 2/2 chemo), In good spirits - pleasant afffect HEENT: [place]/AT, EOMI, MMM, anicteric sclera Neck: no adenopathy, no carotid bruit, no JVD and supple, symmetrical, trachea midline Lungs: CTAP, normal percussion bilaterally and Nonlabored, good air movement Heart: regular rate and rhythm, S1, S2 normal, no murmur, click, rub or gallop and normal apical impulse Abdomen: soft, non-tender; bowel sounds normal; no masses, no organomegaly Extremities: extremities normal, atraumatic, no cyanosis or edema; no significant bruising. Pulses: 2+ and symmetric Neurologic: Alert and oriented X 3, normal strength and tone. Normal symmetric reflexes. Normal coordination and gait; Cranial nerves: normal (II-XII grossly intact)    Adult ECG Report - Not checked  Other studies Reviewed: Additional studies/ records that were reviewed today include:  Recent Labs:   Lab Results  Component Value Date   CHOL 232 (H) 12/04/2015   HDL 70 12/04/2015   LDLCALC 148 (H) 12/04/2015   TRIG 68 12/04/2015   CHOLHDL 3.3 12/04/2015    ASSESSMENT / PLAN: Problem List Items Addressed This Visit    PAF (paroxysmal atrial fibrillation) (HCC): Symptomatic - Rhythm control with Amiodarone. CHA2DS2Vasc Score 7: On Eliquis (Chronic)    Maintaining sinus rhythm with lower dose amiodarone. Continue at 100 mg daily. It sounds like she is having symptoms of chronotropic incompetence.  Plan: Stop metoprolol. This will also help with her hypotension.  Okay to use when necessary dosing of additional 100 mg amiodarone for tachycardia spells.  On Eliquis without bleeding issues. - Okay to stop for procedures, simply notify the office and we can explain timing      On amiodarone  therapy - Primary (Chronic)      Continue to follow LFTs, TFTs and PFTs.  She did have some DLCO reduction at baseline, and next follow-up evaluation I would also check ESR and CRP to exclude inflammation from amiodarone as etiology.      Hypertension due to drug    Amlodipine dose are reduced. Now stopping beta blocker. Discussed holding existing dose of amlodipine for low pressures or dizziness. If this happens frequently, would simply stop amlodipine altogether.      Essential hypertension (Chronic)    Now hypotensive episodes and her amlodipine was already dropped to low-dose, I'm milligrams stop the metoprolol which will give her some blood pressure room. She will monitor her blood pressures of the next few weeks, and hold amlodipine if her pressure is less than 100 mmHg. If this happens frequently, I would simply recommend stopping amlodipine altogether. She continues to be on ARB and spironolactone.      Dyslipidemia, goal LDL below 70 (Chronic)    Has been intolerant of statins in the past, we started Zetia, but I think we need to be somewhat aggressive with bringing her levels diameter not adequately controlled. Labs have been checked by PCP, but in January her lipids were well controlled. May need to consider Crestor which will probably lead to a much longer discussion about medications.      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 She does have the ostial diagonal lesion which is best treated medically. Not having any active anginal symptoms. However she does clearly have CAD.  Did not tolerate statin, therefore we started Zetia. She seems doing okay on that. Continue ARB, but we are stopping beta blocker. She is on amiodarone which provides some rate beta blocker component. Monitor for symptoms. Otherwise medical management.       Other  Visit Diagnoses   None.     Current medicines are reviewed at length with the patient today. (+/- concerns) She continues to be very inquisitive about the nature of Amiodarone & Eliquis  --All questions were answered and issues were discussed to the patient's satisfaction.  --> As a result of the multiple discussions and questions. Total of ~30 minutes spent with the patient with greater than 50% of the time being spent in direct consultation on these various issues.    The following changes have been made:  Stop Metoprolol If BP continues to be low, stop Amlodipine (can hold for SBP < 100 mmHg).  IF YOU HAVE BREAK THROUGH WITH YOUR HEART RATE ( IRREGULAR - AFIB) \. YOU MAY TAKE 2 FULL TABLETS OF THE AMIORADONE TO SEE IF YOU VCAN STOP THE AFIB- YOU CAN CALL THE OFFICE TO LET us KNOW.  IN GENERAL - for [facility] DENT[facility] --MAY HOLD 2 DOSES. BACK INJECTIONS-- MAY HOLD 4-5  DOSES.  F/u: 6 MONTHS WITH DR HARDING--30 MIN APPOINTMENT.  Studies Ordered:   No orders of the defined types were placed in this encounter.     [name], M.D., M.S. Interventional Cardiologist   Pager # [phone] Phone # [phone] [address] [place], [place] [address]

## 2016-08-13 NOTE — Patient Instructions (Signed)
MEDICATION CHANGES  Stop taking METOPROLOL now, if blood pressure continue to be low ( await about 2-3 weeks) , then additional stop AMLODIPINE as well. CALL OFFICE TO LET us KNOW YOU STOPPED MEDICATION.    No other changes at present time.   Your physician wants you to follow-up in: [place] - 30 MIN You will receive a reminder letter in the mail two months in advance. If you don't receive a letter, please call our office to schedule the follow-up appointment.  If you need a refill on your cardiac medications before your next appointment, please call your pharmacy.

## 2016-08-15 NOTE — Assessment & Plan Note (Signed)
Moderate, borderline obstructive coronary disease by catheterization earlier this year. She does have the ostial diagonal lesion which is best treated medically. Not having any active anginal symptoms. However she does clearly have CAD.  Did not tolerate statin, therefore we started Zetia. She seems doing okay on that. Continue ARB, but we are stopping beta blocker. She is on amiodarone which provides some rate beta blocker component. Monitor for symptoms. Otherwise medical management.

## 2016-08-15 NOTE — Assessment & Plan Note (Signed)
Continue to follow LFTs, TFTs and PFTs.  She did have some DLCO reduction at baseline, and next follow-up evaluation I would also check ESR and CRP to exclude inflammation from amiodarone as etiology.

## 2016-08-15 NOTE — Assessment & Plan Note (Signed)
Amlodipine dose are reduced. Now stopping beta blocker. Discussed holding existing dose of amlodipine for low pressures or dizziness. If this happens frequently, would simply stop amlodipine altogether.

## 2016-08-15 NOTE — Assessment & Plan Note (Signed)
Now hypotensive episodes and her amlodipine was already dropped to low-dose, I'm milligrams stop the metoprolol which will give her some blood pressure room. She will monitor her blood pressures of the next few weeks, and hold amlodipine if her pressure is less than 100 mmHg. If this happens frequently, I would simply recommend stopping amlodipine altogether. She continues to be on ARB and spironolactone.

## 2016-08-15 NOTE — Assessment & Plan Note (Signed)
Has been intolerant of statins in the past, we started Zetia, but I think we need to be somewhat aggressive with bringing her levels diameter not adequately controlled. Labs have been checked by PCP, but in January her lipids were well controlled. May need to consider Crestor which will probably lead to a much longer discussion about medications.

## 2016-08-15 NOTE — Assessment & Plan Note (Signed)
Maintaining sinus rhythm with lower dose amiodarone. Continue at 100 mg daily. It sounds like she is having symptoms of chronotropic incompetence.  Plan: Stop metoprolol. This will also help with her hypotension.  Okay to use when necessary dosing of additional 100 mg amiodarone for tachycardia spells.  On Eliquis without bleeding issues. - Okay to stop for procedures, simply notify the office and we can explain timing

## 2016-08-16 ENCOUNTER — Telehealth: Payer: Self-pay | Admitting: Oncology

## 2016-08-16 NOTE — Telephone Encounter (Signed)
Medical Oncology  Patient LM that she is again experiencing pain in upper chest, now back on letrozole x 5 weeks following a 2 week break for same symptoms, which entirely resolved with that break.  She also has right hip pain, side of hip replacement. If that does not improve in break off of letrozole, she will call orthopedist.  She wants to take 1-2 weeks off from letrozole now to improve symptoms, then would like to try 4 weeks on and one week off, to see if this is allows her to tolerate. She tells me that she does want to continue the aromatase inhibitor if possible, as it is giving improvement in the metastatic areas.  Mentioned change to another AI, but will try the 4 weeks on/ 1 week off schedule for now.  Patient knows to call if needed prior to next scheduled visit. She appreciated call.  Godfrey Pick, MD

## 2016-08-20 ENCOUNTER — Other Ambulatory Visit: Payer: Medicare Other

## 2016-08-30 ENCOUNTER — Telehealth: Payer: Self-pay | Admitting: Cardiology

## 2016-08-30 NOTE — Telephone Encounter (Signed)
Follow up ° ° °Pt verbalized that she is returning call for rn °

## 2016-08-30 NOTE — Telephone Encounter (Signed)
Left msg for patient to call. 

## 2016-08-30 NOTE — Telephone Encounter (Signed)
Agree with these recommendations.  Glenetta Hew, MD

## 2016-08-30 NOTE — Telephone Encounter (Signed)
Spoke to patient regarding her BPs. She was recently seen and given instruction to hold her metoprolol and possibly hold amlodipine, pending results of her BP logs.  She notes she has been taking the amlodipine (2.5mg  daily), but has stopped her metoprolol. BPs running as she recorded below, typically 100s-120s. She had one reading of 138/82 last night and became concerned about this. Took whole 5mg  amlodipine this am in response. I explained probably not a concern to have outlier reading like this, and I would not advise taking extra BP med in response. Recommended she continue the 2.5mg  amlodipine and continue to check BPs daily but look at trend of numbers rather than individual readings. We discussed typically normal ranges (100s-130s/60s-80s), and I advised her to call if she notes a trend of higher numbers. Pt voiced understanding and thanks.

## 2016-08-30 NOTE — Telephone Encounter (Signed)
New message   BP readings: Thursday 9/28th - morning 94/66, evening 99/60, Friday 9/29th - morning 109/67, evening 101/59, Sat 9/30 - morning 103/66, evening 111/66, Sunday 9/31- morning 120/67, evening 138/82, this morning 122/76.    Please call.

## 2016-09-06 ENCOUNTER — Other Ambulatory Visit: Payer: Self-pay

## 2016-09-06 DIAGNOSIS — C561 Malignant neoplasm of right ovary: Secondary | ICD-10-CM

## 2016-09-06 DIAGNOSIS — C563 Malignant neoplasm of bilateral ovaries: Secondary | ICD-10-CM

## 2016-09-06 DIAGNOSIS — C562 Malignant neoplasm of left ovary: Principal | ICD-10-CM

## 2016-09-06 MED ORDER — LETROZOLE 2.5 MG PO TABS: 2.5000 mg | ORAL_TABLET | Freq: Every day | ORAL | 3 refills | Status: DC

## 2016-09-10 ENCOUNTER — Telehealth: Payer: Self-pay

## 2016-09-10 NOTE — Telephone Encounter (Signed)
Pt restarted letrozole for 2 weeks and developed knee pain. She could not walk on it. She stopped letrozole 2 days ago and knee pain is mostly gone.  (She had prior episode of hip pain that went away when she stopped letrozole.)  She is asking for what to do?

## 2016-09-12 ENCOUNTER — Other Ambulatory Visit: Payer: Self-pay | Admitting: Oncology

## 2016-09-13 ENCOUNTER — Ambulatory Visit (HOSPITAL_BASED_OUTPATIENT_CLINIC_OR_DEPARTMENT_OTHER): Payer: Medicare Other

## 2016-09-13 VITALS — BP 120/74 | HR 68 | Temp 98.0°F | Resp 18

## 2016-09-13 DIAGNOSIS — C5701 Malignant neoplasm of right fallopian tube: Secondary | ICD-10-CM

## 2016-09-13 DIAGNOSIS — C562 Malignant neoplasm of left ovary: Secondary | ICD-10-CM

## 2016-09-13 DIAGNOSIS — Z23 Encounter for immunization: Secondary | ICD-10-CM | POA: Diagnosis not present

## 2016-09-13 DIAGNOSIS — C5702 Malignant neoplasm of left fallopian tube: Secondary | ICD-10-CM | POA: Diagnosis not present

## 2016-09-13 DIAGNOSIS — C7989 Secondary malignant neoplasm of other specified sites: Secondary | ICD-10-CM

## 2016-09-13 DIAGNOSIS — C561 Malignant neoplasm of right ovary: Secondary | ICD-10-CM | POA: Diagnosis not present

## 2016-09-13 DIAGNOSIS — Z95828 Presence of other vascular implants and grafts: Secondary | ICD-10-CM

## 2016-09-13 MED ORDER — SODIUM CHLORIDE 0.9 % IJ SOLN
10.0000 mL | INTRAMUSCULAR | Status: DC | PRN
  Administered 2016-09-13: 10 mL via INTRAVENOUS
  Filled 2016-09-13: qty 10

## 2016-09-13 MED ORDER — HEPARIN SOD (PORK) LOCK FLUSH 100 UNIT/ML IV SOLN
500.0000 [IU] | Freq: Once | INTRAVENOUS | Status: AC | PRN
  Administered 2016-09-13: 500 [IU] via INTRAVENOUS
  Filled 2016-09-13: qty 5

## 2016-09-13 MED ORDER — INFLUENZA VAC SPLIT QUAD 0.5 ML IM SUSY
0.5000 mL | PREFILLED_SYRINGE | Freq: Once | INTRAMUSCULAR | Status: AC
  Administered 2016-09-13: 0.5 mL via INTRAMUSCULAR
  Filled 2016-09-13: qty 0.5

## 2016-09-14 ENCOUNTER — Telehealth: Payer: Self-pay

## 2016-09-14 DIAGNOSIS — C7989 Secondary malignant neoplasm of other specified sites: Secondary | ICD-10-CM

## 2016-09-14 MED ORDER — ANASTROZOLE 1 MG PO TABS: 1.0000 mg | ORAL_TABLET | Freq: Every day | ORAL | 0 refills | Status: DC

## 2016-09-14 NOTE — Telephone Encounter (Signed)
LM for [name] to call back to discuss Dr. Mariana Kaufman recommendations for knee pain.

## 2016-09-14 NOTE — Telephone Encounter (Signed)
Ms Gillock will stop letrozole  and begin the Arimidex about a week. Prescription sent to patient's pharmacy. She will get an appointment with Dr. Ninfa Linden as noted below by Dr. Marko Plume. Faxed Dr. Mariana Kaufman last office note from 07-19-16 and this phone note to Dr. Ninfa Linden

## 2016-09-14 NOTE — Telephone Encounter (Signed)
  [name], [name] Female, 75 y.o., 04-25-1941 Weight:  133 lb (60.3 kg) Phone:  [phone] PCP:  Shon Baton, MD FYI General MRN:  [id number] MyChart:  Active Next Appt:  10/07/2016 knee pain  Received: 2 days ago  Message Contents  Gordy Levan, MD  Baruch Merl, RN; Janace Hoard, RN        Patient call from 10-130-17:   Pt restarted letrozole for 2 weeks and developed knee pain. She could not walk on it. She stopped letrozole 2 days ago and knee pain is mostly gone. (She had prior episode of hip pain that went away when she stopped letrozole.) She is asking for what to do?   1.Please set up apt with Dr Zollie Beckers for him to check knees. If there is something that he can improve from orthopedic standpoint, that would be helpful. Please see him even if knees seem better now.  RN please send my last note + this phone message to his office   2.stop letrozole. We will try another that is related to the letrozole but some difference in some patients in the amount of joint side effects.  Please begin Arimidex 1 mg daily at least a week prior to my next visit on Nov 9. Script #30 one daily hormone blocker   thanks

## 2016-09-14 NOTE — Telephone Encounter (Signed)
-----   Message from Gordy Levan, MD sent at 09/12/2016  6:50 PM EDT ----- Regarding: knee pain Patient call from 10-130-17:  Pt restarted letrozole for 2 weeks and developed knee pain. She could not walk on it. She stopped letrozole 2 days ago and knee pain is mostly gone.  (She had prior episode of hip pain that went away when she stopped letrozole.)  She is asking for what to do?  1.Please set up apt with Dr Zollie Beckers for him to check knees. If there is something that he can improve from orthopedic standpoint, that would be helpful. Please see him even if knees seem better now. RN please send my last note + this phone message to his office  2.stop letrozole. We will try another that is related to the letrozole but some difference in some patients in the amount of joint side effects. Please begin Arimidex 1 mg daily at least a week prior to my next visit on Nov 9.  Script #30 one daily hormone blocker  thanks

## 2016-09-15 ENCOUNTER — Telehealth: Payer: Self-pay | Admitting: Cardiology

## 2016-09-15 NOTE — Telephone Encounter (Signed)
New Message  Pt c/o medication issue:  1. Name of Medication: potassium.... spironolactone  2. How are you currently taking this medication (dosage and times per day)? 84mEQ... 25mg   3. Are you having a reaction (difficulty breathing--STAT)? No   4. What is your medication issue? Pt call requesting to speak with RN to see if she its taking to much potassium. Please call back to discuss

## 2016-09-15 NOTE — Telephone Encounter (Signed)
Any concerns for the anastrozole?

## 2016-09-15 NOTE — Telephone Encounter (Signed)
Don't take spironolactone on the 3 days that she takes potassium.  Would probably be fine to do it, but just to avoid potential complications.  Glenetta Hew, MD

## 2016-09-15 NOTE — Telephone Encounter (Signed)
Returned call to patient. She states she wanted to make sure no contraindications to continuing her potassium & taking spironolactone. She only takes potassium 3 times a week but noted the potential for hyperK.    Also was recently prescribed anastrozole by oncologist, and wanted to make sure this is safe to take given her heart history. Patient reports no symptoms or acute concerns beyond these medication questions.  Aware that I will route this for review by provider/pharmD.

## 2016-09-15 NOTE — Telephone Encounter (Signed)
Attempted to reach patient to discuss spironolactone recommendations, no answer or VM pickup when dialed.

## 2016-09-16 NOTE — Telephone Encounter (Signed)
There are some cardiovascular concerns with anastrazole with vasodilation and worsening of ischemic heart disease being the most commonly reported. However, the benefits may out weigh the risks in this case.

## 2016-09-16 NOTE — Telephone Encounter (Signed)
Left msg for patient to call. 

## 2016-09-17 ENCOUNTER — Telehealth: Payer: Self-pay | Admitting: *Deleted

## 2016-09-17 NOTE — Telephone Encounter (Signed)
Spoke with patient.  She is due to start arimidex next Wednesday.  She noticed that there was a warning on this medication - not to take it if you have history of heart trouble.  Pt. States she had a heart attack last January.  She did call her cardiologist, Dr. Glenetta Hew.  He said there was some danger with this drug, but that she should go ahead and take it anyway for her cancer.  Patient is wondering if there is anything safer that she could take instead.  Her call back number is [phone]

## 2016-09-17 NOTE — Telephone Encounter (Signed)
Let detailed message on patient voicemail to not take the spironolactone on the 3 days she takes the potassium. Also information regarding relayed to the patient.

## 2016-09-17 NOTE — Telephone Encounter (Signed)
F/u Message ° °Pt returning RN call. Please call back to discuss  °

## 2016-09-21 ENCOUNTER — Telehealth: Payer: Self-pay | Admitting: *Deleted

## 2016-09-21 NOTE — Telephone Encounter (Signed)
-----   Message from Gordy Levan, MD sent at 09/21/2016 11:21 AM EDT ----- Message sent to RN on 10-20 as follows, please be sure patient gets this message"  "from triage:  Spoke with patient. She is due to start arimidex next Wednesday. She noticed that there was a warning on this medication - not to take it if you have history of heart trouble. Pt. States she had a heart attack last January. She did call her cardiologist, Dr. Glenetta Hew. He said there was some danger with this drug, but that she should go ahead and take it anyway for her cancer. Patient is wondering if there is anything safer that she could take instead. Her call back number is [phone]   Please let patient know that all of the aromatase inhibitors have a very slight cardiac risk, including the letrozole that she has taken previously. I appreciate Dr Ellyn Hack giving [place] his opinion as above. "  thanks   ----- Message ----- From: Patton Salles, RN Sent: 09/21/2016  10:16 AM To: Gordy Levan, MD   [name] left a message this morning requesting a call back regarding message below.  Thanks, [name]    09/17/16 2:01 PM  Ignacia Felling, RN routed this conversation to [facility]    09/17/16 1:58 PM  Note   Spoke with patient.  She is due to start arimidex next Wednesday.  She noticed that there was a warning on this medication - not to take it if you have history of heart trouble.  Pt. States she had a heart attack last January.  She did call her cardiologist, Dr. Glenetta Hew.  He said there was some danger with this drug, but that she should go ahead and take it anyway for her cancer.  Patient is wondering if there is anything safer that she could take instead.  Her call back number is [phone]

## 2016-09-21 NOTE — Telephone Encounter (Signed)
Pt notified of Dr Mariana Kaufman response. Pt Is OK taking arimidex until she sees Dr Marko Plume next month

## 2016-09-29 ENCOUNTER — Ambulatory Visit (INDEPENDENT_AMBULATORY_CARE_PROVIDER_SITE_OTHER): Payer: Medicare Other

## 2016-09-29 ENCOUNTER — Ambulatory Visit (INDEPENDENT_AMBULATORY_CARE_PROVIDER_SITE_OTHER): Payer: Medicare Other | Admitting: Physician Assistant

## 2016-09-29 DIAGNOSIS — G8929 Other chronic pain: Secondary | ICD-10-CM

## 2016-09-29 DIAGNOSIS — M25561 Pain in right knee: Secondary | ICD-10-CM | POA: Diagnosis not present

## 2016-09-29 DIAGNOSIS — M25551 Pain in right hip: Secondary | ICD-10-CM

## 2016-09-29 DIAGNOSIS — M25562 Pain in left knee: Secondary | ICD-10-CM

## 2016-09-29 NOTE — Progress Notes (Signed)
Office Visit Note   Patient: [name]           Date of Birth: March 03, 1941           MRN: [id number] Visit Date: 09/29/2016              Requested by: Shon Baton, MD [place], [place] [address] PCP: Precious Reel, MD   Assessment & Plan: Visit Diagnoses:  1. Chronic pain of right knee   2. Chronic pain of left knee   3. Pain in right hip     Plan: Continue to work on the left quad strengthening. Suggest IT band stretching with her  Follow-Up Instructions: No Follow-up on file.   Orders:  Orders Placed This Encounter  Procedures  . XR KNEE 3 VIEW LEFT  . XR KNEE 3 VIEW RIGHT  . XR HIP UNILAT W OR W/O PELVIS 2-3 VIEWS RIGHT   No orders of the defined types were placed in this encounter.     Procedures: Procedure Note  Patient: [name]                                                            Date of Birth: 11-15-1941                                                   MRN: [id number]                                                               Visit Date: 09/29/2016  Procedures: Visit Diagnoses: Pain in right hip - Plan: XR HIP UNILAT W OR W/O PELVIS 2-3 VIEWS RIGHT, Large Joint Injection/Arthrocentesis  Chronic pain of left knee - Plan: XR KNEE 3 VIEW LEFT  Chronic pain of right knee - Plan: XR KNEE 3 VIEW RIGHT  Large Joint Inj Date/Time: 09/30/2016 8:23 AM Performed by: Pete Pelt Authorized by: Pete Pelt  Consent Given by:  Patient Indications:  Pain Location:  Hip Needle Size:  22 G Needle Length:  1.5 inches Approach:  Lateral Ultrasound Guidance: No   Fluoroscopic Guidance: No   Arthrogram: No Medications:  1 mL lidocaine 1 %; 40 mg methylPREDNISolone acetate 40 MG/ML Aspiration Attempted: No   Patient tolerance:  Patient tolerated the procedure well with no immediate complications                My Note      Clinical Data: No additional findings.   Subjective: Chief Complaint    Patient presents with  . Left Knee - Pain, Routine Post Op, Follow-up  . Right Hip - Pain  . Right Knee - Pain    Patient returns today with right hip pain for the past 2 months pain level II out of 10 difficulty sleeping wakes her up she is lying on that side. Most pain lateral aspect with occasional groin pain. Mild pain in  the right knee She is also having pain in her left knee which she had a total knee arthroplasty 2016 she's concerned that the pain in her hip knee may be due to her medication LETROZOLE. As she's had sternal pain and she feels was definitely caused by this and she feels that it may be causing her other joints today. Her oncologist wanted her to have radiographs of her knees and hip for thoroughness.     Review of Systems   Objective: Vital Signs: There were no vitals taken for this visit.  Physical Exam  Constitutional: She is oriented to person, place, and time. She appears well-developed and well-nourished.  Neurological: She is alert and oriented to person, place, and time.  Psychiatric: She has a normal mood and affect.    Ortho Exam  Left knee overall good range of motion without pain. She has a slight effusion the slight instability about his motor strength right knee good range of motion without pain. Right hip has good range of motion of the hip without pain today. She has tenderness over the right trochanteric region.  Specialty Comments:  No specialty comments available.  Imaging: No results found.   PMFS History:  Patient Active Problem List   Diagnosis Date Noted  . Thrombocytopenia ([place]) 03/27/2016  . High risk medications (not anticoagulants) long-term use 03/27/2016  . Chronic anticoagulation 03/27/2016  . Atherosclerotic heart disease of native coronary artery without angina pectoris 03/03/2016  . Embolic stroke ([place]) [id number]  . Vaginal atrophy 12/29/2015  . On amiodarone therapy 12/23/2015  . Dyslipidemia, goal LDL below 70   .  Stroke with cerebral ischemia ([place])   . Visual disturbance   . Cerebral thrombosis with cerebral infarction 12/08/2015  . NSTEMI (non-ST elevated myocardial infarction) ([place]) 12/04/2015  . 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 Chemotherapy-induced peripheral neuropathy ([place]) 07/23/2015  . Portacath in place 07/23/2015  . Ovarian cancer, bilateral ([place]) 07/23/2015  . International Federation of Gynecology and Obstetrics (FIGO) 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[place]) 07/23/2015  . Genetic testing 07/14/2015  . Encounter for antineoplastic chemotherapy 06/25/2015  . Port catheter in place 06/25/2015  . Antineoplastic chemotherapy induced pancytopenia ([facility]) 06/02/2015  . Dehydration 06/02/2015  . 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 Chemotherapy induced neutropenia ([place]) 05/13/2015  . Chemotherapy induced thrombocytopenia 05/13/2015  . Hyperbilirubinemia 05/06/2015  . Osteoarthritis of left knee 01/03/2015  . Status post total left knee replacement 01/03/2015  . PAF (paroxysmal atrial fibrillation) ([place] [facility]): Symptomatic - Rhythm control with Amiodarone. CHA2DS2Vasc Score 7: On Eliquis   . Fever 05/03/2013    Class: Acute  . Nausea 05/03/2013    Class: Acute  . Constipation 05/03/2013    Class: Acute  . Syncope, history of 04/19/2013  . Essential hypertension 04/19/2013  . Hypothyroidism 04/19/2013  . Degenerative arthritis of hip 02/25/2012   Past Medical History:  Diagnosis Date  . Arthritis    OSTEOARTHRITIS   -- CONSTANT PAIN RIGHT HIP---AND PAIN LEFT KNEE--PT STATES SHE GETS INJECTIONS INTO HER KNEE  . Complication of anesthesia    BLOOD PRESSURE DROPPED WITH NASAL SURGERY, ONE OF THE CARPAL TUNNEL REPAIRS AND DURING A COLONOSCOPY  . Dyslipidemia   . History of skin cancer   . Hypertension   . Hypothyroidism   . Menopausal symptoms   . NSTEMI (non-ST elevated myocardial infarction) ([place] [facility]) 12/09/15   Medical management:  Distal branch of D1 95%, ostial D2 75%, distal LAD  50%. Tortuous arteries consistent with hypertension  . Ovarian cancer ([place]) 01/2013   Recurrence since 2014/2016  . PAF (paroxysmal atrial fibrillation) ([facility])    Only on Plavix -- not full Anticoagulation per pt. request; On Amiodarone (Eye Exam 12/25/15)    Family History  Problem Relation Age of Onset  . Hypertension Mother   . Basal cell carcinoma Mother   . AAA (abdominal aortic aneurysm) Father   . Heart Problems Maternal Uncle   . Heart Problems Maternal Grandfather   . AAA (abdominal aortic aneurysm) Paternal Grandmother   . Prostate cancer Maternal Uncle 70  . Prostate cancer Other   . Other Daughter     one daughter had TAH-BSO at 44; other daughter will have one soon    Past Surgical History:  Procedure Laterality Date  . BILATERAL CARPAL TUNNEL REPAIR  2007  . CARDIAC CATHETERIZATION N/A 12/05/2015   Procedure: Left Heart Cath and Coronary Angiography;  Surgeon: Belva Crome, MD;  Location: [facility];  Service: Cardiovascular;  distal branch of D1 95%, ostial D2 75%, dLAD 50%,p-mRCA 40%. Tortuous vessels.  Marland Kitchen DILATION AND CURETTAGE OF UTERUS  1969  . LAPAROTOMY Bilateral 02/13/2013   Procedure: EXPLORATORY LAPAROTOMY TOTAL ABDOMINAL HYSTERECTOMY BILATERAL SALPINGO-OOPHORECTOMY, Partial Rectal Resection with Reanastamosis;  Surgeon: Alvino Chapel, MD;  Location: [facility];  Service: Gynecology;  Laterality: Bilateral;  . LEFT KNEE ARTHROSCOPY   2011  . LYMPHADENECTOMY Right 02/13/2013   Procedure: PEVLIC  LYMPHADENECTOMY, DEBULKING right pelvic tumor nodules;  Surgeon: Alvino Chapel, MD;  Location: [facility];  Service: Gynecology;  Laterality: Right;  . NM MYOVIEW LTD  June 2010    subbmaximal with no ischemia or infarction.  . OMENTECTOMY  02/13/2013   Procedure: OMENTECTOMY;  Surgeon: Alvino Chapel, MD;  Location: [facility];  Service: Gynecology;;  . [place]  . SURGERY  FOR RUPTURED OVARIAN CYST  1969  . TAH/BSO/Tumor debulking with right pelvic LND  01/2013  . TONSILLECTOMY  1962  . TOTAL HIP ARTHROPLASTY  02/25/2012   Procedure: TOTAL HIP ARTHROPLASTY ANTERIOR APPROACH;  Surgeon: Mcarthur Rossetti, MD;  Location: [facility];  Service: Orthopedics;  Laterality: Right;  . TOTAL KNEE ARTHROPLASTY Left 01/03/2015   Procedure: LEFT TOTAL KNEE ARTHROPLASTY;  Surgeon: Mcarthur Rossetti, MD;  Location: [facility];  Service: Orthopedics;  Laterality: Left;  . TRANSTHORACIC ECHOCARDIOGRAM  May 2014; January 2016   a. Normal LV size function. EF 60-65%. Grade 1 diastolic function. Mild MR and mildly elevated PA pressures of 37 mmHg per;; b. EF 60-65%. Mobile echodensity 10 mm x 6 mm attest interventricular septum is questionable fibroblastoma.   Social History   Occupational History  . Not on file.   Social History Main Topics  . Smoking status: Never Smoker  . Smokeless tobacco: Never Used  . Alcohol use No  . Drug use: No  . Sexual activity: Not Currently

## 2016-09-30 NOTE — Progress Notes (Deleted)
   Procedure Note  Patient: [name]             Date of Birth: August 14, 1941           MRN: [id number]             Visit Date: 09/29/2016  Procedures: Visit Diagnoses: Pain in right hip - Plan: XR HIP UNILAT W OR W/O PELVIS 2-3 VIEWS RIGHT, Large Joint Injection/Arthrocentesis  Chronic pain of left knee - Plan: XR KNEE 3 VIEW LEFT  Chronic pain of right knee - Plan: XR KNEE 3 VIEW RIGHT  Large Joint Inj Date/Time: 09/30/2016 8:23 AM Performed by: Pete Pelt Authorized by: Pete Pelt   Consent Given by:  Patient Indications:  Pain Location:  Hip Needle Size:  22 G Needle Length:  1.5 inches Approach:  Lateral Ultrasound Guidance: No   Fluoroscopic Guidance: No   Arthrogram: No Medications:  1 mL lidocaine 1 %; 40 mg methylPREDNISolone acetate 40 MG/ML Aspiration Attempted: No   Patient tolerance:  Patient tolerated the procedure well with no immediate complications

## 2016-10-03 ENCOUNTER — Other Ambulatory Visit: Payer: Self-pay | Admitting: Oncology

## 2016-10-03 DIAGNOSIS — C561 Malignant neoplasm of right ovary: Secondary | ICD-10-CM

## 2016-10-03 DIAGNOSIS — C562 Malignant neoplasm of left ovary: Principal | ICD-10-CM

## 2016-10-03 DIAGNOSIS — C563 Malignant neoplasm of bilateral ovaries: Secondary | ICD-10-CM

## 2016-10-07 ENCOUNTER — Ambulatory Visit (HOSPITAL_BASED_OUTPATIENT_CLINIC_OR_DEPARTMENT_OTHER): Payer: Medicare Other

## 2016-10-07 ENCOUNTER — Ambulatory Visit (HOSPITAL_BASED_OUTPATIENT_CLINIC_OR_DEPARTMENT_OTHER): Payer: Medicare Other | Admitting: Oncology

## 2016-10-07 ENCOUNTER — Encounter: Payer: Self-pay | Admitting: Oncology

## 2016-10-07 VITALS — BP 136/65 | HR 71 | Temp 98.6°F | Resp 18 | Ht 63.0 in | Wt 133.4 lb

## 2016-10-07 DIAGNOSIS — C7989 Secondary malignant neoplasm of other specified sites: Secondary | ICD-10-CM

## 2016-10-07 DIAGNOSIS — C7889 Secondary malignant neoplasm of other digestive organs: Secondary | ICD-10-CM

## 2016-10-07 DIAGNOSIS — C562 Malignant neoplasm of left ovary: Secondary | ICD-10-CM

## 2016-10-07 DIAGNOSIS — I252 Old myocardial infarction: Secondary | ICD-10-CM

## 2016-10-07 DIAGNOSIS — E039 Hypothyroidism, unspecified: Secondary | ICD-10-CM

## 2016-10-07 DIAGNOSIS — C561 Malignant neoplasm of right ovary: Secondary | ICD-10-CM

## 2016-10-07 DIAGNOSIS — C5701 Malignant neoplasm of right fallopian tube: Secondary | ICD-10-CM | POA: Diagnosis not present

## 2016-10-07 DIAGNOSIS — G620 Drug-induced polyneuropathy: Secondary | ICD-10-CM

## 2016-10-07 DIAGNOSIS — Z95828 Presence of other vascular implants and grafts: Secondary | ICD-10-CM

## 2016-10-07 DIAGNOSIS — Z8673 Personal history of transient ischemic attack (TIA), and cerebral infarction without residual deficits: Secondary | ICD-10-CM

## 2016-10-07 DIAGNOSIS — C5702 Malignant neoplasm of left fallopian tube: Secondary | ICD-10-CM | POA: Diagnosis not present

## 2016-10-07 DIAGNOSIS — K219 Gastro-esophageal reflux disease without esophagitis: Secondary | ICD-10-CM

## 2016-10-07 DIAGNOSIS — K5900 Constipation, unspecified: Secondary | ICD-10-CM

## 2016-10-07 DIAGNOSIS — I480 Paroxysmal atrial fibrillation: Secondary | ICD-10-CM

## 2016-10-07 DIAGNOSIS — R200 Anesthesia of skin: Secondary | ICD-10-CM

## 2016-10-07 DIAGNOSIS — I10 Essential (primary) hypertension: Secondary | ICD-10-CM

## 2016-10-07 DIAGNOSIS — Z7901 Long term (current) use of anticoagulants: Secondary | ICD-10-CM

## 2016-10-07 DIAGNOSIS — C569 Malignant neoplasm of unspecified ovary: Secondary | ICD-10-CM

## 2016-10-07 DIAGNOSIS — D696 Thrombocytopenia, unspecified: Secondary | ICD-10-CM

## 2016-10-07 DIAGNOSIS — C563 Malignant neoplasm of bilateral ovaries: Secondary | ICD-10-CM

## 2016-10-07 LAB — COMPREHENSIVE METABOLIC PANEL
ALBUMIN: 3.9 g/dL (ref 3.5–5.0)
ALK PHOS: 95 U/L (ref 40–150)
ALT: 17 U/L (ref 0–55)
AST: 15 U/L (ref 5–34)
Anion Gap: 7 mEq/L (ref 3–11)
BUN: 14.5 mg/dL (ref 7.0–26.0)
CALCIUM: 9.6 mg/dL (ref 8.4–10.4)
CHLORIDE: 103 meq/L (ref 98–109)
CO2: 27 mEq/L (ref 22–29)
Creatinine: 0.8 mg/dL (ref 0.6–1.1)
EGFR: 78 mL/min/{1.73_m2} — AB (ref >90)
Glucose: 84 mg/dl (ref 70–140)
POTASSIUM: 4 meq/L (ref 3.5–5.1)
Sodium: 137 mEq/L (ref 136–145)
Total Bilirubin: 0.59 mg/dL (ref 0.20–1.20)
Total Protein: 6.6 g/dL (ref 6.4–8.3)

## 2016-10-07 LAB — CBC WITH DIFFERENTIAL/PLATELET
BASO%: 0.5 % (ref 0.0–2.0)
BASOS ABS: 0 10*3/uL (ref 0.0–0.1)
EOS ABS: 0 10*3/uL (ref 0.0–0.5)
EOS%: 0.2 % (ref 0.0–7.0)
HEMATOCRIT: 33.9 % — AB (ref 34.8–46.6)
HEMOGLOBIN: 11.6 g/dL (ref 11.6–15.9)
LYMPH#: 1.5 10*3/uL (ref 0.9–3.3)
LYMPH%: 32.9 % (ref 14.0–49.7)
MCH: 34 pg (ref 25.1–34.0)
MCHC: 34.2 g/dL (ref 31.5–36.0)
MCV: 99.4 fL (ref 79.5–101.0)
MONO#: 0.5 10*3/uL (ref 0.1–0.9)
MONO%: 11.1 % (ref 0.0–14.0)
NEUT#: 2.4 10*3/uL (ref 1.5–6.5)
NEUT%: 55.3 % (ref 38.4–76.8)
Platelets: 135 10*3/uL — ABNORMAL LOW (ref 145–400)
RBC: 3.41 10*6/uL — ABNORMAL LOW (ref 3.70–5.45)
RDW: 12.9 % (ref 11.2–14.5)
WBC: 4.4 10*3/uL (ref 3.9–10.3)

## 2016-10-07 MED ORDER — HEPARIN SOD (PORK) LOCK FLUSH 100 UNIT/ML IV SOLN
500.0000 [IU] | Freq: Once | INTRAVENOUS | Status: AC | PRN
  Administered 2016-10-07: 500 [IU] via INTRAVENOUS
  Filled 2016-10-07: qty 5

## 2016-10-07 MED ORDER — SODIUM CHLORIDE 0.9 % IJ SOLN
10.0000 mL | INTRAMUSCULAR | Status: DC | PRN
Start: 2016-10-07 — End: 2016-10-07
  Administered 2016-10-07: 10 mL via INTRAVENOUS
  Filled 2016-10-07: qty 10

## 2016-10-07 NOTE — Progress Notes (Signed)
OFFICE PROGRESS NOTE   October 09, 2016   Physicians: D. ClarkePearson; J.Russo, T.Fontaine, Glenetta Hew (cardiology), [name], D.Jacobs, Zollie Beckers, P.Sethi, J.Moody, D.Shoemaker   INTERVAL HISTORY:   Patient is seen, alone for visit, in continuing attention to treatment ongoing for recurrent ovarian cancer, now on arimidex since 09-21-16. She saw Dr Josephina Shih 05-2016 and will see him again early Jan 2018. Last imaging CT AP 05-2016 Next apt Dr Virgina Jock Jan 2018  Patient was on letrozole from March 2017 until 08-2016, with improvement in splenic met by CT 05-2016, however, she was concerned that various pain (anterior chest, knees) was related to letrozole so preferred to change. Note Dr Ellyn Hack did not feel cardiac risk significant from AI in this situation.  She saw orthopedist in last week re knee pain, which is positional (left knee replacement) and right hip pain (right hip replacement), with cortisone injection to hip that has helped. It is not likely that the knee or hip pain is related to aromatase inhibitor.  She has new numbness of lower lip, not involving chin, tho she is not sure if this began before or after arimidex. She denies pain mouth, jaw or upper neck/ posterior head. She recalls that numbness of lip was allergic reaction to lisinopril in past. She saw dentist 06-2016, no problems identified.  She has had more difficulty with chronic constipation recently, continues miralax, colace, occasional mineral oil, occasional dulcolax. She has not tried Linzess or Amitiza, may want to discuss with Dr Virgina Jock. Appetite is fine, no different abdominal discomfort,  Otherwise she is feeling well, with energy generally good, no SOB, no chest pain, no LE swelling, no bleeding, no fever or symptoms of infection, no problems with PAC. She is not complaining of hot flashes. Remainder of 10 point Review of Systems negative.   Flu vaccine 09-13-16 PAC flushed 10-07-16 Genetics testing  normal 06-26-15 (Breast Ovarian Panel by [facility]) CA 125 on 05-01-15   33 Bone density scan 10-2014 normal   ONCOLOGIC HISTORY Patient presented in late Jan 2014 with 2 weeks of spotting in early Dec 2015. Sonohystogram 01-05-13 showed uterus normal size and echotexture, endometrium 4.3 mm, left ovary normal and right adnexa with 1.1 x 8.4.x8.2 cm cystic and solid mass. Endometrial biopsy benign and CA 125 also on 01-05-13 was 178.8. CT AP 01-17-13 with 1.0 x 6.9 x 8.9 cm complex right ovarian mass, no ascites, small retroperitoneal nodes. She was seen by Dr Skeet Latch on 01-18-13 and taken to surgery by Dr Josephina Shih on 02-13-13, which was TAH/BSO/ omentectomy/ureterolysis/ resection of cul-de-sac tumor/right pelvic lymphadnectomy and resection of rectum with reanastomosis. At completion of surgery there was no gross residual disease. 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 Washings [phone]) had rare clusters of atypical cells. Chemotherapy with dose dense taxol carboplatin was begun day 1 cycle 1 on 03-20-13; ANC was 1.1 on day 15 cycle 1 with taxol given and neupogen added 04-04-13. She had day 1 cycle 2 treatment on 04-17-13, then was briefly hospitalized 5-22 to 04-20-13 after syncopal episode, with antihypertensive agents DCd and UTI treated. She was feeling much better at time of "day 8" cycle 2 on 05-01-13 and did have neupogen 300 mcg x 1 dose on 05-02-13. She was readmitted to hospital 6-5 thru 05-05-13 with fever, empirically on antibiotics  and blood cultures negative. Cycle 6 completed 08-14-13. CT AP 09-25-13 had no evidence of cancer and CA 125 was 18.3 on 07-24-2013. Marker was 20 in early Dec 2015, 26 on 01-31-15 and 35 on 03-14-15. PET 04-03-15 documented  disease bilateral pelvic sidewalls, vaginal cuff and scattered areas thru abdomen/ pelvis, as well as area of uptake in spleen. She resumed carboplatin taxol using q 3 week regimen on 05-01-15. She was neutropenic with ANC 0.3 on day 9 despite beginning granix on day 5. She had 6 cycles of carbo taxol from 05-01-15 thru 08-25-15, with avastin cycles 2,3,4, 6. CT AP 09-10-15 showed resolution of involvement other than in spleen, which was improved. She saw Dr Josephina Shih on 09-12-15 , with discussion of splenectomy followed by avastin vs 3 additional cycles of chemo + avastin then reassess. Patient preferred additional treatment to splenectomy, with carbo taxol avastin resumed 09-29-15 and treated again 10-21-15. She tolerated treatment progressively more poorly, such that 1 planned additional cycle was held. CT CAP 11-06-15 and PET 11-18-15 showed persistent disease in area of splenic hilum (between stomach and spleen) and at 1 cm right pelvic sidewall node.  She had NSTEMI with marked HTN early Jan 2017, then CVA. She stopped chronic estrogen late Jan and began letrozole late March 2017. CT AP 06-23-16 showed significant improvement in splenic met. Patient preferred change from letrozole to arimidex in late 08-2016 due to concern about pain in knees etc.     Objective:  Vital signs in last 24 hours:  BP 136/65 (BP Location: Right Arm, Patient Position: Sitting)   Pulse 71   Temp 98.6 F (37 C) (Oral)   Resp 18   Ht 5' 3"  (1.6 m)   Wt 133 lb 6.4 oz (60.5 kg)   SpO2 100%   BMI 23.63 kg/m  Weight stable. Looks comfortable Alert, oriented and appropriate. Ambulatory without assistance, carefully able to get on and off exam table   HEENT:PERRL, sclerae not icteric. Oral mucosa moist without lesions, posterior pharynx clear.  Neck supple. No JVD.  Lymphatics:no cervical,supraclavicular or inguinal adenopathy Resp: clear to auscultation bilaterally and normal percussion bilaterally Cardio: regular  rate and rhythm. No gallop. GI: soft, nontender, not distended, no mass or organomegaly. Normally active bowel sounds. Surgical incision not remarkable. Musculoskeletal/ Extremities:LE without pitting edema, cords, tenderness. Knees without tenderness to palpation or obvious effusion. Arthritic joint changes hands bilaterally.  PSYCH appropriate mood and affect. Neuro: speech and movement of lips normal. Decreased sensation bilateral lower lip, not chin.  No change residual peripheral neuropathy feet.  Skin without rash, ecchymosis, petechiae Portacath-without erythema or tenderness  Lab Results:  Results for orders placed or performed in visit on 10/07/16  CBC with Differential  Result Value Ref Range   WBC 4.4 3.9 - 10.3 10e3/uL   NEUT# 2.4 1.5 - 6.5 10e3/uL   HGB 11.6 11.6 - 15.9 g/dL   HCT 33.9 (L) 34.8 - 46.6 %   Platelets 135 (L) 145 - 400 10e3/uL   MCV 99.4 79.5 - 101.0 fL   MCH 34.0 25.1 - 34.0 pg   MCHC 34.2 31.5 - 36.0 g/dL   RBC 3.41 (L) 3.70 - 5.45 10e6/uL   RDW 12.9 11.2 - 14.5 %   lymph# 1.5 0.9 - 3.3 10e3/uL   MONO# 0.5 0.1 - 0.9 10e3/uL   Eosinophils Absolute 0.0 0.0 - 0.5 10e3/uL   Basophils Absolute 0.0 0.0 - 0.1 10e3/uL   NEUT% 55.3 38.4 - 76.8 %   LYMPH% 32.9  14.0 - 49.7 %   MONO% 11.1 0.0 - 14.0 %   EOS% 0.2 0.0 - 7.0 %   BASO% 0.5 0.0 - 2.0 %  Comprehensive metabolic panel  Result Value Ref Range   Sodium 137 136 - 145 mEq/L   Potassium 4.0 3.5 - 5.1 mEq/L   Chloride 103 98 - 109 mEq/L   CO2 27 22 - 29 mEq/L   Glucose 84 70 - 140 mg/dl   BUN 14.5 7.0 - 26.0 mg/dL   Creatinine 0.8 0.6 - 1.1 mg/dL   Total Bilirubin 0.59 0.20 - 1.20 mg/dL   Alkaline Phosphatase 95 40 - 150 U/L   AST 15 5 - 34 U/L   ALT 17 0 - 55 U/L   Total Protein 6.6 6.4 - 8.3 g/dL   Albumin 3.9 3.5 - 5.0 g/dL   Calcium 9.6 8.4 - 10.4 mg/dL   Anion Gap 7 3 - 11 mEq/L   EGFR 78 (L) >90 ml/min/1.73 m2  CA 125  Result Value Ref Range   Cancer Antigen (CA) 125 18.4 0.0 - 38.1 U/mL    *Note: Due to a large number of results and/or encounters for the requested time period, some results have not been displayed. A complete set of results can be found in Results Review.   CMET normal including electrolytes, glucose, creatinine 0.9, LFTs, calcium, protein and alb CA 125 available after visit  18.4, this having been 14.6 on 06-25-16  Studies/Results:  No results found.  Medications: I have reviewed the patient's current medications. No other new medications with exception of recent orthopedic injection  DISCUSSION All of interval history reviewed.  Very rare occurrences of "paresthesias" listed for arimidex, not clear if this is etiology of lower lip numbness, note also not clear if this began before or after arimidex. She will hold arimidex next ~3 days and let me know next week how the lip numbness is. If numbness resolves off of arimidex, could resume letrozole or change to aromasin. If no improvement in lip numbness after several days or a week, does not seem likely that it would be from that medication, may be able to resume. She is known to neurology if needed.   Of note, she dislikes all medications. Clinically she has tolerated oral aromatase inhibitor much better than chemotherapy.  Patient is aware that another medical oncologist will follow beginning in Jan. She will have PAC flushed with Dr Tamela Oddi visit early Jan if not done prior.   Assessment/Plan:  1.Recurrent ovarian carcinoma: on aromatase inhibitor(s) since 01-2016. Marker minimally higher today. Hold arimidex next several days to see if lower lip numbness improves, patient to let us know by phone next week. She will see Dr Josephina Shih next in early Jan. Consider repeat scans.   IIB poorly differentiated serous involving bilateral ovaries and tubes at optimal radical debulking 02-13-13. Adjuvant dose dense carboplatin taxol completed 08-14-2013. Progressive disease spring 2016, asymptomatic. Resumed  chemotherapy with carboplatin and taxol on 05-01-15, addition of avastin with cycle 2. Restaging CT 08-2015 after 6 cycles improved, with residual in spleen. Additional 2 cycles carbo taxol avastin given 09-29-15 and 10-21-15, tolerated poorly including hypertension. CT CAP and PET 10-2015 residual disease in region of splenic hilum and 1 right pelvic node. Following complications of NSTEMI and CVA 11-2015, with very poor tolerance of chemotherapy and contraindication for additional avastin, treatment was held. Patient stopped long term estrogen late Jan 2017. Letrozole begun late 01-2016. CT AP 06-23-16 improved. Letrozole changed to arimidex  09-21-16 due to patient's concerns about large joint symptoms (doubt related). 2.right anterior chest pain early July: ED evaluation negative for cardiac etiology or acute findings on CT angio chest. Not clearly related to letrozole. Pain resolved. 3.NSTEMI with marked HTN on 12-03-15, then acute CVA ~ 12-07-15 either from cath or PAF. Now on Eliquis, cardiology and neurology involved . HTN previously exacerbated by avastin, back to baseline since last avastin 10-21-15 4.paroxysmal Afib: followed by cardiology, on Eliquis 5.minimal thrombocytopenia possibly medication related. Platelets better at 135k today,  no bleeding. Note on Eliquis 6.chronic constipation, up to date on colonoscopy. Marked constipation by last CT. She will increase the bowel regimen that is tolerable for her. 7..hypothyroid on replacement by PCP. Diffuse uptake left lobe thyroid by scans, per radiologist consistent with thyroiditis 8. Long estrogen replacement: Surgical path from 02-13-13  Positive for ER PR (even tho was on same estrogen at that time). Patient DCd estrogen in late Jan 2017. She is not complaining of hot flashes now. 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 Up to date mamm[facility] 04-06-16, scattered fibroglandular density noted. 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 11. GERD: better with protonix, continue. 12. Normal bone density by scan at Dr Keane Police office [phone]. Should have this repeated ~ 10-2016 particularly if still on letrozole then 13.PAC in : flush at least every 6-8 weeks when not otherwise used. 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 Time spent 25 min including >505 counseling and coordination of care. Route PCP, in EMR for gyn onc, neurology, cardiology.   Evlyn Clines, MD   10/09/2016, 7:13 PM

## 2016-10-08 ENCOUNTER — Telehealth: Payer: Self-pay

## 2016-10-08 LAB — CA 125: CANCER ANTIGEN (CA) 125: 18.4 U/mL (ref 0.0–38.1)

## 2016-10-08 NOTE — Telephone Encounter (Signed)
Left a message in [name]'s vm stating that her CA-125 was up a little but still well with in the normal range.  The value can go up and down.  If Dr. Marko Plume wants to make any changes in her care plan after reviewing office visit notes, we will call her with the information.

## 2016-10-14 ENCOUNTER — Telehealth: Payer: Self-pay

## 2016-10-14 NOTE — Telephone Encounter (Signed)
Pt reported she stopped anastrazole Friday 11/10 the lip numbness was about gone by Sunday. She restarted anastrazole Tues 11/14 and so for is OK with no lip numbness. She will continue anastrazole. If numbness comes back she will call.

## 2016-10-14 NOTE — Telephone Encounter (Signed)
-----   Message from Gordy Levan, MD sent at 10/07/2016 10:48 AM EST ----- Regarding: patient to call with update next week Patient to call RN next week to let us know if bottom lip numbness has improved with holding arimidex beginning 11-10. If no change in symptoms will resume arimidex and will need to get Virgina Jock or neurology or dentist to see If clearly better off of arimidex, will change to aromasin  thanks

## 2016-10-19 MED ORDER — ANASTROZOLE 1 MG PO TABS: 1.0000 mg | ORAL_TABLET | Freq: Every day | ORAL | 0 refills | Status: DC

## 2016-10-29 ENCOUNTER — Other Ambulatory Visit: Payer: Self-pay | Admitting: *Deleted

## 2016-10-29 ENCOUNTER — Encounter: Payer: Self-pay | Admitting: Podiatry

## 2016-10-29 ENCOUNTER — Encounter: Payer: Self-pay | Admitting: *Deleted

## 2016-10-29 ENCOUNTER — Ambulatory Visit (INDEPENDENT_AMBULATORY_CARE_PROVIDER_SITE_OTHER): Payer: Medicare Other | Admitting: Podiatry

## 2016-10-29 ENCOUNTER — Ambulatory Visit (INDEPENDENT_AMBULATORY_CARE_PROVIDER_SITE_OTHER): Payer: Medicare Other

## 2016-10-29 VITALS — BP 142/80 | HR 72 | Resp 16

## 2016-10-29 DIAGNOSIS — M2042 Other hammer toe(s) (acquired), left foot: Secondary | ICD-10-CM

## 2016-10-29 DIAGNOSIS — M2041 Other hammer toe(s) (acquired), right foot: Secondary | ICD-10-CM

## 2016-10-29 DIAGNOSIS — M2011 Hallux valgus (acquired), right foot: Secondary | ICD-10-CM

## 2016-10-29 NOTE — Progress Notes (Signed)
   Subjective:    Patient ID: [name], female    DOB: 1941-01-18, 75 y.o.   MRN: [id number]  HPI 75 year old female presents today to further discuss foot deformity on her right foot and to discussible surgical intervention. She states that she started to get more pain to the bunion site she's having difficulty wearing certain shoes. She has tried offloading pad as well as changing her shoes without any significant relief. She denies any open sores. No recent injury. No other complaints.  Review of Systems  All other systems reviewed and are negative.      Objective:   Physical Exam General: AAO x3, NAD  Dermatological: Erythema upon the medial bunion foot from irritation in shoe gear. Nails x 10 are well manicured; remaining integument appears unremarkable at this time. There are no open sores, no preulcerative lesions, no rash or signs of infection present.  Vascular: Dorsalis Pedis artery and Posterior Tibial artery pedal pulses are 2/4 bilateral with immedate capillary fill time.  There is no pain with calf compression, swelling, warmth, erythema.   Neruologic: Grossly intact via light touch bilateral. Vibratory intact via tuning fork bilateral. Protective threshold with Semmes Wienstein monofilament intact to all pedal sites bilateral.   Musculoskeletal: there is very severe HAV present bilaterally and hammertoe that are present. There is prominent fifth metatarsal base on the right foot. There is pain with 1st MTPJ ROM and decrease ROM. Has prominent metatarsal heads plantarly and atrophy of the fat pad. MMT 5/5. No other areas of tenderness.   Gait: Unassisted, Nonantalgic.      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 She would need to have a major reconstruction in  order to fully correct the deformity. She does not want to undergo a big surgery. I discussed with her a lesser surgery to correct the bunion to help eliminate some of the symptoms but this is not going to current the deformity completely. She will continue with conservative treatment for now. Discussed shoe changes and offloading. Follow-up if symptoms worsen or if she starts to have any skin break-down.   Celesta Gentile, DPM

## 2016-11-02 ENCOUNTER — Telehealth: Payer: Self-pay

## 2016-11-02 NOTE — Telephone Encounter (Signed)
Pt received message and is agreeable to plan below.

## 2016-11-02 NOTE — Telephone Encounter (Signed)
LM for Ms [name] that she has an appointment with Dr. Aldean Ast on 12-07-15 at 1045.  She needs to register at 1015. Sent a scheduling message to have labs for CT scan drawn the week of 11-22-16 from [facility].

## 2016-11-04 ENCOUNTER — Telehealth: Payer: Self-pay | Admitting: Oncology

## 2016-11-04 NOTE — Telephone Encounter (Signed)
lvm to inform pt of 12/27 appt date/time per LOS

## 2016-11-09 ENCOUNTER — Telehealth: Payer: Self-pay

## 2016-11-09 NOTE — Telephone Encounter (Signed)
[name] called stating that she was experiencing severe joint and muscle aches.  Her hips and knees have not hurt this much in a long time. She stopped taking the anastrozole Saturday 11-06-16.  She is already feeling better. She also has had a lot of difficulty with constipatation.  Using Miralax bid, dulcolax, and she had to use a fleet enema finallylast week to get results. She also stated that her hands have been swollen for more then 2 months. Next appointment is 11-24-16 with Dr. Aldean Ast.

## 2016-11-10 ENCOUNTER — Telehealth: Payer: Self-pay

## 2016-11-10 NOTE — Telephone Encounter (Signed)
lvm on home phone with Dr Camila Li attached response.

## 2016-11-10 NOTE — Telephone Encounter (Signed)
error 

## 2016-11-10 NOTE — Telephone Encounter (Signed)
-----   Message from Gordy Levan, MD sent at 11/09/2016  5:33 PM EST ----- Ms Wincek called stating that she was experiencing severe joint and muscle aches.  Her hips and knees have not hurt this much in a long time. She stopped taking the anastrozole Saturday 11-06-16.  She is already feeling better. She also has had a lot of difficulty with constipatation.  Using Miralax bid, dulcolax, and she had to use a fleet enema finallylast week to get results. She also stated that her hands have been swollen for more then 2 months. Next appointment is 11-24-16 with Dr. Aldean Ast.  MD response: stay off anastrozole at least until she sees Dr CP.  Message sent to sched for me to see her in Jan.

## 2016-11-24 ENCOUNTER — Other Ambulatory Visit (HOSPITAL_BASED_OUTPATIENT_CLINIC_OR_DEPARTMENT_OTHER): Payer: Medicare Other

## 2016-11-24 ENCOUNTER — Ambulatory Visit (HOSPITAL_BASED_OUTPATIENT_CLINIC_OR_DEPARTMENT_OTHER): Payer: Medicare Other

## 2016-11-24 DIAGNOSIS — C562 Malignant neoplasm of left ovary: Principal | ICD-10-CM

## 2016-11-24 DIAGNOSIS — C5701 Malignant neoplasm of right fallopian tube: Secondary | ICD-10-CM

## 2016-11-24 DIAGNOSIS — C561 Malignant neoplasm of right ovary: Secondary | ICD-10-CM

## 2016-11-24 DIAGNOSIS — C7989 Secondary malignant neoplasm of other specified sites: Secondary | ICD-10-CM

## 2016-11-24 DIAGNOSIS — Z95828 Presence of other vascular implants and grafts: Secondary | ICD-10-CM

## 2016-11-24 DIAGNOSIS — C5702 Malignant neoplasm of left fallopian tube: Secondary | ICD-10-CM

## 2016-11-24 DIAGNOSIS — C563 Malignant neoplasm of bilateral ovaries: Secondary | ICD-10-CM

## 2016-11-24 LAB — COMPREHENSIVE METABOLIC PANEL
ALBUMIN: 3.9 g/dL (ref 3.5–5.0)
ALK PHOS: 104 U/L (ref 40–150)
ALT: 15 U/L (ref 0–55)
AST: 18 U/L (ref 5–34)
Anion Gap: 7 mEq/L (ref 3–11)
BILIRUBIN TOTAL: 0.57 mg/dL (ref 0.20–1.20)
BUN: 12.5 mg/dL (ref 7.0–26.0)
CALCIUM: 9.4 mg/dL (ref 8.4–10.4)
CO2: 27 mEq/L (ref 22–29)
CREATININE: 0.8 mg/dL (ref 0.6–1.1)
Chloride: 103 mEq/L (ref 98–109)
EGFR: 76 mL/min/{1.73_m2} — ABNORMAL LOW (ref >90)
GLUCOSE: 103 mg/dL (ref 70–140)
Potassium: 4 mEq/L (ref 3.5–5.1)
SODIUM: 138 meq/L (ref 136–145)
TOTAL PROTEIN: 6.5 g/dL (ref 6.4–8.3)

## 2016-11-24 LAB — CBC WITH DIFFERENTIAL/PLATELET
BASO%: 0.2 % (ref 0.0–2.0)
BASOS ABS: 0 10*3/uL (ref 0.0–0.1)
EOS ABS: 0 10*3/uL (ref 0.0–0.5)
EOS%: 0.7 % (ref 0.0–7.0)
HEMATOCRIT: 34 % — AB (ref 34.8–46.6)
HEMOGLOBIN: 11.5 g/dL — AB (ref 11.6–15.9)
LYMPH#: 1.4 10*3/uL (ref 0.9–3.3)
LYMPH%: 34 % (ref 14.0–49.7)
MCH: 33.8 pg (ref 25.1–34.0)
MCHC: 33.8 g/dL (ref 31.5–36.0)
MCV: 100 fL (ref 79.5–101.0)
MONO#: 0.4 10*3/uL (ref 0.1–0.9)
MONO%: 8.9 % (ref 0.0–14.0)
NEUT%: 56.2 % (ref 38.4–76.8)
NEUTROS ABS: 2.4 10*3/uL (ref 1.5–6.5)
Platelets: 120 10*3/uL — ABNORMAL LOW (ref 145–400)
RBC: 3.4 10*6/uL — ABNORMAL LOW (ref 3.70–5.45)
RDW: 12.9 % (ref 11.2–14.5)
WBC: 4.2 10*3/uL (ref 3.9–10.3)

## 2016-11-24 MED ORDER — SODIUM CHLORIDE 0.9 % IJ SOLN
10.0000 mL | INTRAMUSCULAR | Status: DC | PRN
  Administered 2016-11-24: 10 mL via INTRAVENOUS
  Filled 2016-11-24: qty 10

## 2016-11-24 MED ORDER — HEPARIN SOD (PORK) LOCK FLUSH 100 UNIT/ML IV SOLN
500.0000 [IU] | Freq: Once | INTRAVENOUS | Status: AC | PRN
  Administered 2016-11-24: 500 [IU] via INTRAVENOUS
  Filled 2016-11-24: qty 5

## 2016-11-25 LAB — CA 125: Cancer Antigen (CA) 125: 14.3 U/mL (ref 0.0–38.1)

## 2016-12-01 ENCOUNTER — Ambulatory Visit (HOSPITAL_COMMUNITY)
Admission: RE | Admit: 2016-12-01 | Discharge: 2016-12-01 | Disposition: A | Discharge status: Home / Self Care | Payer: Medicare Other | Source: Ambulatory Visit | Attending: Oncology | Admitting: Oncology

## 2016-12-01 ENCOUNTER — Encounter (HOSPITAL_COMMUNITY): Payer: Self-pay

## 2016-12-01 DIAGNOSIS — I700 Atherosclerosis of aorta: Secondary | ICD-10-CM | POA: Diagnosis not present

## 2016-12-01 DIAGNOSIS — C562 Malignant neoplasm of left ovary: Secondary | ICD-10-CM | POA: Insufficient documentation

## 2016-12-01 DIAGNOSIS — C561 Malignant neoplasm of right ovary: Secondary | ICD-10-CM | POA: Diagnosis not present

## 2016-12-01 DIAGNOSIS — C563 Malignant neoplasm of bilateral ovaries: Secondary | ICD-10-CM

## 2016-12-01 MED ORDER — IOPAMIDOL (ISOVUE-300) INJECTION 61%
INTRAVENOUS | Status: AC
  Administered 2016-12-01: 100 mL
  Filled 2016-12-01: qty 100

## 2016-12-05 IMAGING — MR MR HEAD W/O CM
9 of 11 series · 33 of 48 positions shown · non-contrast
Comparison: CT head 11/26/2015

CLINICAL DATA: Visual disturbance. Double vision. Recent cardiac
catheterization 12/05/2015.

EXAM:
MRI HEAD WITHOUT CONTRAST
TECHNIQUE: Multiplanar, multiecho pulse sequences of the brain and surrounding
structures were obtained without intravenous contrast.

[Series 4: DWI · axial · 3.6mm · 0.94mm/px · z∈[-142,+9]mm · 8 of 86 slices shown (1 of 4)]
[im 1/86]
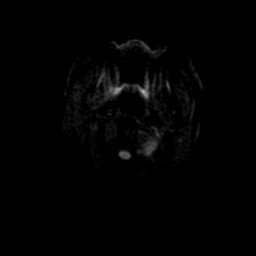
[im 13/86]
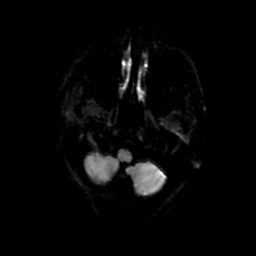
[im 25/86]
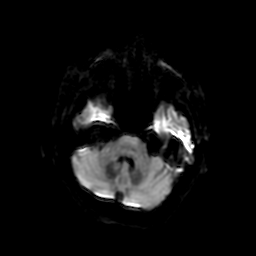
[im 37/86]
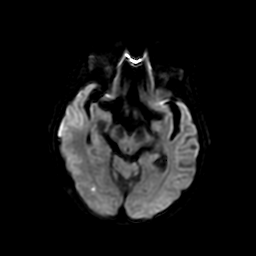
[im 49/86]
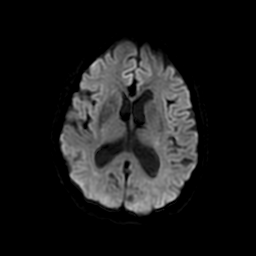
[im 61/86]
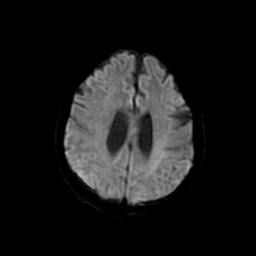
[im 73/86]
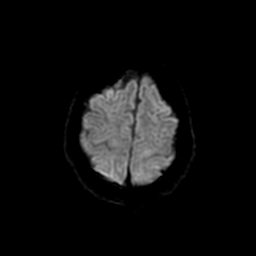
[im 86/86]
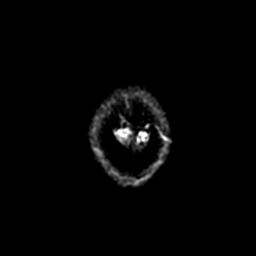

[Series 5: FLAIR · sagittal · 5.0mm · 0.47mm/px · 2 of 25 slices shown (1 of 2)]
[im 1/25]
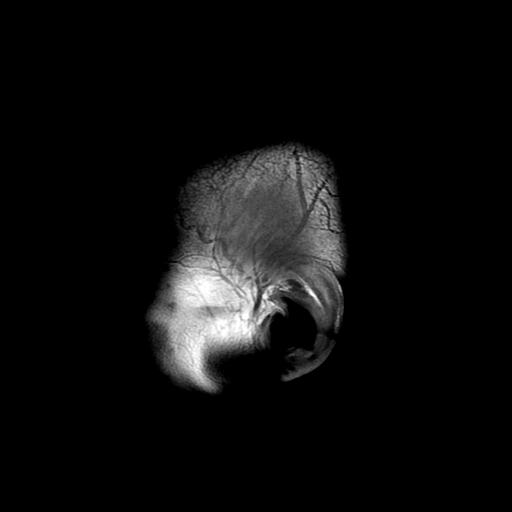
[im 25/25]
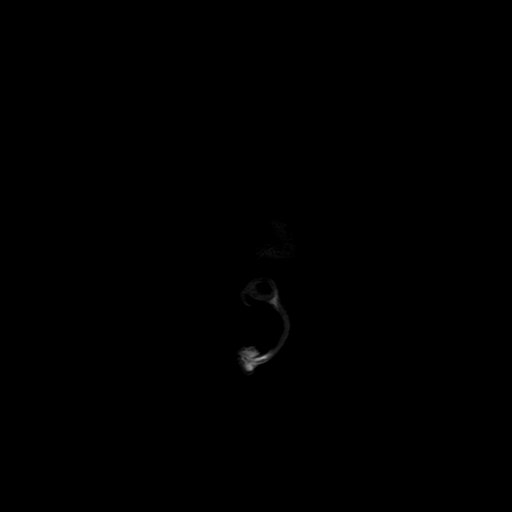

[Series 6: T2 · axial · 5.0mm · 0.47mm/px · z∈[-140,+10]mm · 2 of 26 slices shown]
[im 1/26]
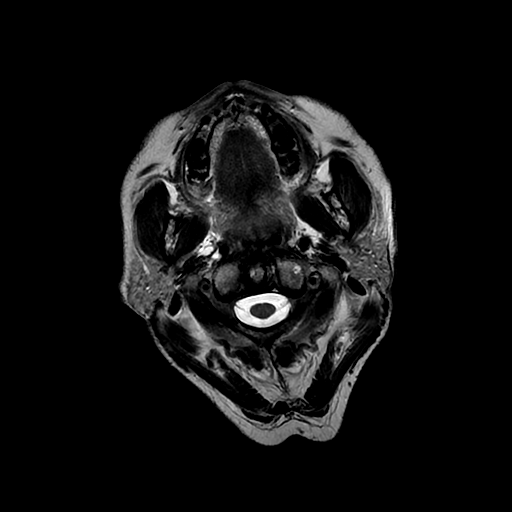
[im 26/26]
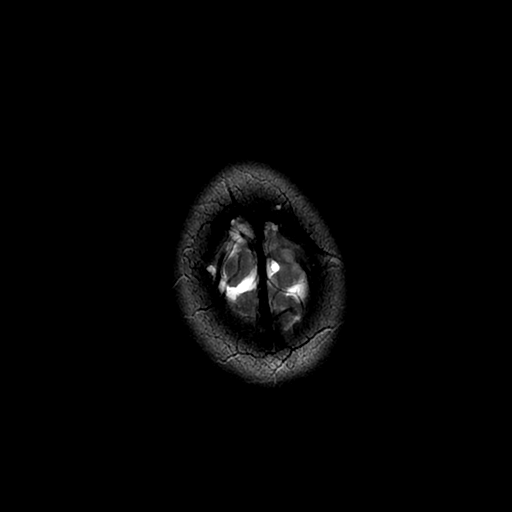

[Series 7: FLAIR · axial · 5.0mm · 0.47mm/px · z∈[-140,+10]mm · 2 of 26 slices shown (2 of 2)]
[im 1/26]
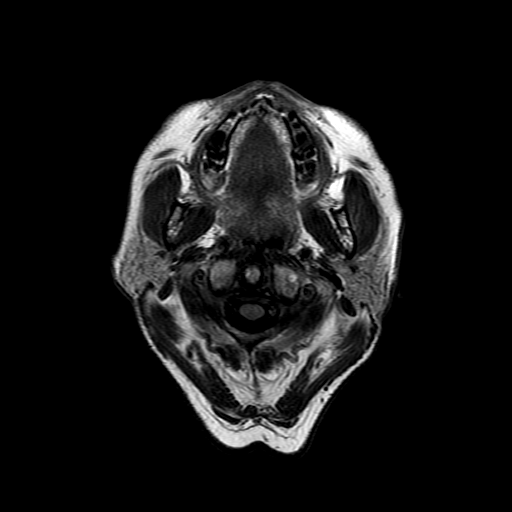
[im 26/26]
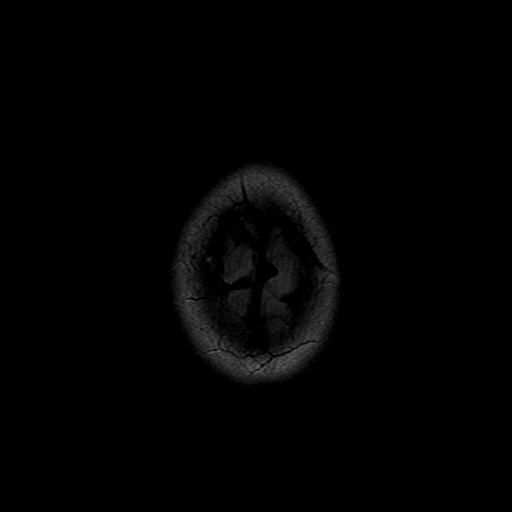

[Series 8: DWI · coronal · 5.0mm · 0.94mm/px · 6 of 72 slices shown (2 of 4)]
[im 1/72]
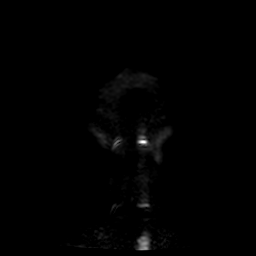
[im 15/72]
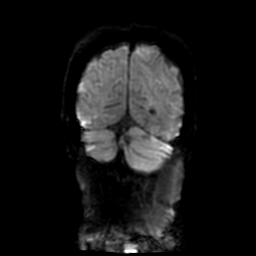
[im 29/72]
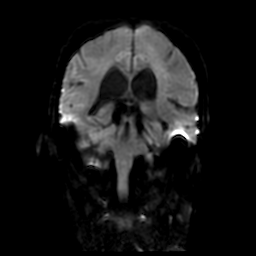
[im 43/72]
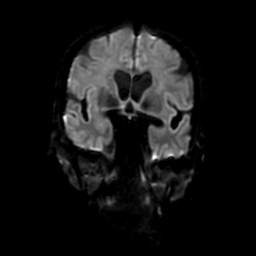
[im 57/72]
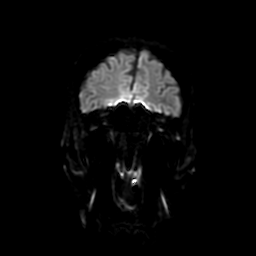
[im 72/72]
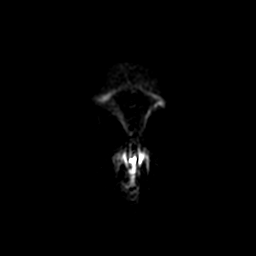

[Series 9: (person_name) · axial · 3.0mm · 0.47mm/px · z∈[-142,-55]mm · 5 of 104 slices shown]
[im 1/104]
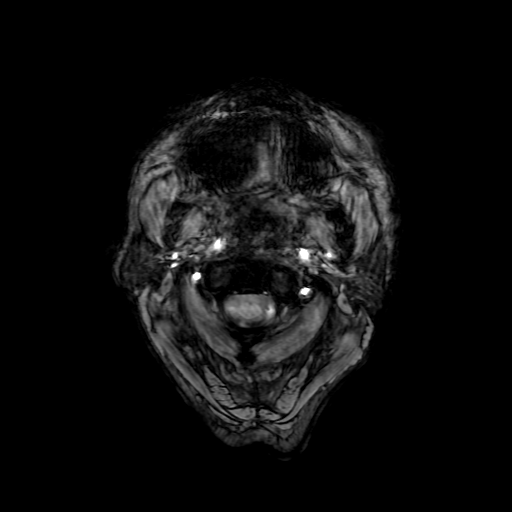
[im 15/104]
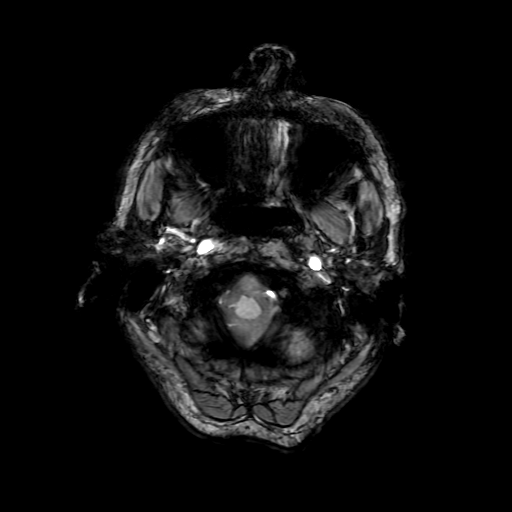
[im 30/104]
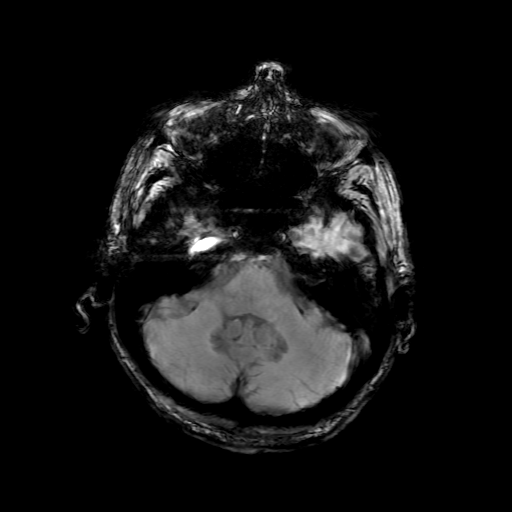
[im 45/104]
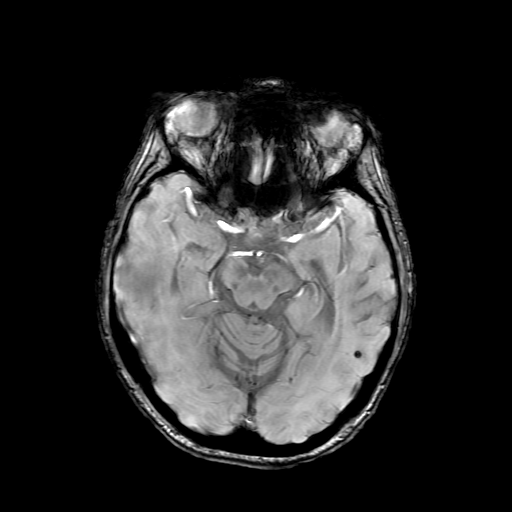
[im 59/104]
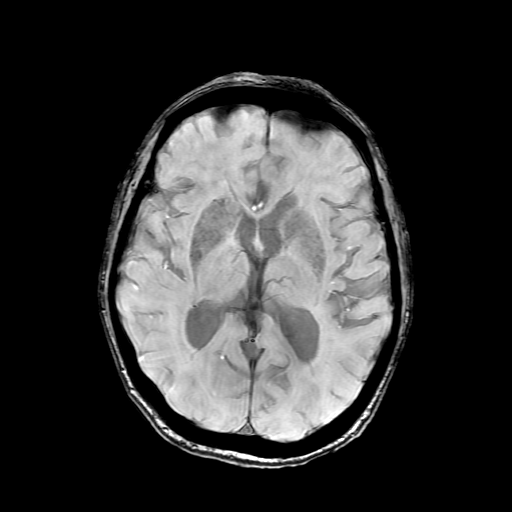

[Series 11: T2 post-contrast · coronal · 5.0mm · 0.39mm/px · 2 of 30 slices shown]
[im 1/30]
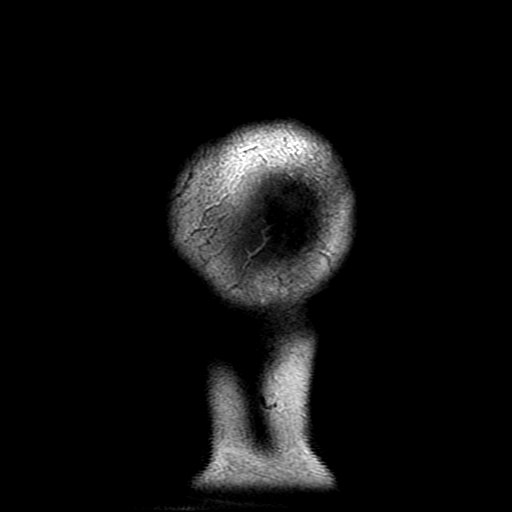
[im 30/30]
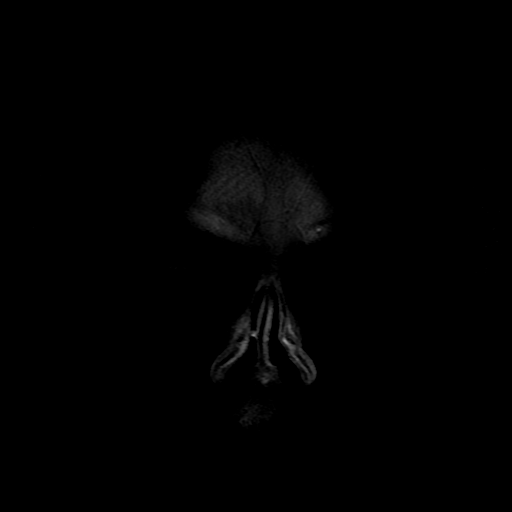

[Series 400: DWI · axial · 3.6mm · 0.94mm/px · z∈[-142,+9]mm · 3 of 43 slices shown (3 of 4)]
[im 1/43]
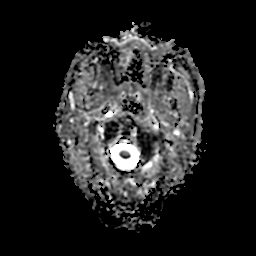
[im 22/43]
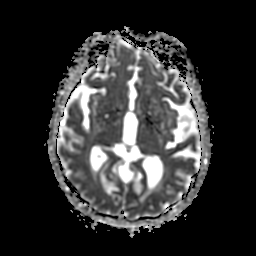
[im 43/43]
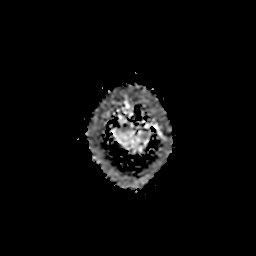

[Series 800: DWI · coronal · 5.0mm · 0.94mm/px · 3 of 35 slices shown (4 of 4)]
[im 1/35]
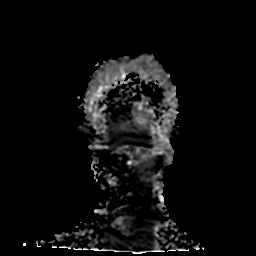
[im 18/35]
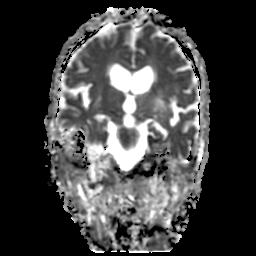
[im 35/35]
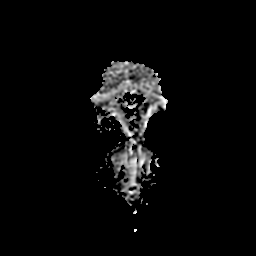

[33 of 48 positions shown; findings below may reference images not displayed]

FINDINGS: Small area of restricted diffusion in the right posterior temporal/
parietal lobe. Several small areas of restricted diffusion are
present just above the tentorium. The largest measures 1 cm. Others
measure 3-5 mm. There appears to be normal signal in the right
transverse sinus without evidence of venous sinus thrombosis. No
other areas of acute infarct.

Generalized atrophy. Moderate chronic microvascular ischemic change
in the white matter. Brainstem and basal ganglia intact. No
cerebellar infarct.

Chronic micro hemorrhage left temporoparietal lobe. Micro hemorrhage
left cerebellum. No hematoma.

Negative for mass or edema.  No shift of the midline structures

Paranasal sinuses clear.
IMPRESSION: Small areas of acute infarct in the right temporoparietal lobe just
above the tentorium. No evidence of venous sinus thrombosis. These
may be embolic given the history of recent cardiac catheterization.

Atrophy and chronic microvascular ischemia.

Chronic micro hemorrhage left parietal lobe and left cerebellum
likely due to chronic hypertension.

## 2016-12-06 ENCOUNTER — Ambulatory Visit: Payer: Medicare Other | Attending: Gynecology | Admitting: Gynecology

## 2016-12-06 ENCOUNTER — Encounter: Payer: Self-pay | Admitting: Gynecology

## 2016-12-06 VITALS — BP 142/73 | HR 80 | Temp 98.0°F | Resp 18 | Ht 63.0 in | Wt 135.8 lb

## 2016-12-06 DIAGNOSIS — E039 Hypothyroidism, unspecified: Secondary | ICD-10-CM | POA: Insufficient documentation

## 2016-12-06 DIAGNOSIS — C569 Malignant neoplasm of unspecified ovary: Secondary | ICD-10-CM | POA: Diagnosis not present

## 2016-12-06 DIAGNOSIS — Z85828 Personal history of other malignant neoplasm of skin: Secondary | ICD-10-CM | POA: Diagnosis not present

## 2016-12-06 DIAGNOSIS — C562 Malignant neoplasm of left ovary: Secondary | ICD-10-CM | POA: Insufficient documentation

## 2016-12-06 DIAGNOSIS — E785 Hyperlipidemia, unspecified: Secondary | ICD-10-CM | POA: Insufficient documentation

## 2016-12-06 DIAGNOSIS — C7989 Secondary malignant neoplasm of other specified sites: Secondary | ICD-10-CM | POA: Diagnosis not present

## 2016-12-06 DIAGNOSIS — C561 Malignant neoplasm of right ovary: Secondary | ICD-10-CM | POA: Diagnosis not present

## 2016-12-06 DIAGNOSIS — I10 Essential (primary) hypertension: Secondary | ICD-10-CM | POA: Insufficient documentation

## 2016-12-06 NOTE — Progress Notes (Signed)
Consult Note: Gyn-Onc   [name] 76 y.o. female  Chief Complaint  Patient presents with  . Ovarian Cancer    Assessment and plan :   Stage IIb poorly differentiated ovarian cancer Clinically free of disease with a normal CT scan and CA-125 values. Because the patient was having some any side effects from what was all she discontinued it in November.  She return to see me in early May and we will repeat a CA-125 value at that time.     Interval History:   The patient returns today as previously scheduled.  Since her last visit she discontinued taking letrozole in November because of a number of side effects. On 12/01/2016 she had a. repeat CT scan shows that the lesion near the spleen has resolved. Further there is no other evidence of disease and her CA-125 is 14 units per mL (18 units per mL previously)   Overall the patient feels very well. Specifically, she  has no abdominal or pelvic symptoms. She has some chronic constipation. Otherwise she has no GI GU or pelvic symptoms.  HPI:The patient initially presented with a pelvic mass and elevated CA 125 (178 units per mL) She underwent exploratory laparotomy and debulking on 02/13/2013. Final pathology showed a poorly differentiated ovarian cancer involving both ovaries pelvic peritoneum and rectal muscularis. All gross disease was resected.  She then received 6 cycles of carboplatin and Taxol chemotherapy completed in September 2014. At the completion of chemotherapy her CA 125 was 18 units per mL.   In May 2016 the patient CA-125 began to rise and a PET CT scan showed metastatic disease in the spleen and pelvic lymph nodes. Given the long platinum free interval , we retreated with carboplatin, Taxol, and a Avastin. She received 6 cycles of carboplatin and Taxol and a Avastin the last being administered in September 2016. She had a significant response with CA 125 falling to 12 units per mL in September. CT scan showed persistent  disease in the spleen (which was actually responding). Patient received a total 8 cycles and on follow-up in December 2016 had persistent disease in the spleen which had slightly decreased in size. At that time CA-125 is 14 units per mL.  In March 2017 we switch the patient to letrozole.   A CT scan obtained 06/23/2016 showed a response with the spleen met measure 1.5 x 1.5 cm (previously 2.4 x 3.9 cm). Because of side effects resolve was discontinued in November 2017. A follow-up CT scan on 12/01/2016 showed no evidence of the splenic lesion and her CA-125 at that time was 14 units per mL.   Review of Systems:10 point review of systems is negative except as noted in interval history.   Vitals: Blood pressure (!) 142/73, pulse 80, temperature 98 F (36.7 C), temperature source Oral, resp. rate 18, height _0  (1.6 m), weight 135 lb 12.8 oz (61.6 kg), SpO2 100 %.  Physical Exam: General : The patient is a healthy woman in no acute distress.  HEENT: normocephalic, extraoccular movements normal; neck is supple without thyromegally , Port-A-Cath appears healthy is no evidence of infection in the right chest wall. Lynphnodes: Supraclavicular and inguinal nodes not enlarged  Abdomen: Soft, non-tender, no ascites, no organomegally, no masses, no hernias , midline incision is healing well  Pelvic:    EGBUS: Normal female  Vagina bladder urethra: Normal  Vaginal cuff is well healed.  Cervix and uterus are surgically absent  Bimanual exam: No masses nodularity  or fullness.  Rectovaginal exam confirms      Lower extremities: No edema or varicosities. Normal range of motion      Allergies  Allergen Reactions  . Coreg [Carvedilol] Shortness Of Breath  . Caffeine Other (See Comments)    Makes heart race  . Ceftin [Cefuroxime Axetil]     Interferes with propafenone.   (Note pt is no longer taking propafenone 06/04/16)  . Clonidine Derivatives Other (See Comments)    Pt does not remember  what the reaction was  . Codeine Other (See Comments)    Does not like the feeling she gets  . Flexeril [Cyclobenzaprine] Other (See Comments)    Pt states "increased heart rate"  . Levaquin [Levofloxacin In D5w]     Interferes with propafenone.  (note: pt is no longer taking propafenone 06/04/16)  . Lipitor [Atorvastatin] Dermatitis    "feels like bugs are biting her"   . Lisinopril     LIP NUMBNESS  . Pravastatin Other (See Comments)    FEELS LIKE "BUG BITES" BITING HER LEGS   . Tape Rash    TEGADERM.   (use opsite on PAC)  . Tegaderm Ag Mesh [Silver] Rash and Other (See Comments)    Burns skin  . Ultram [Tramadol] Palpitations    Past Medical History:  Diagnosis Date  . Arthritis    OSTEOARTHRITIS   -- CONSTANT PAIN RIGHT HIP---AND PAIN LEFT KNEE--PT STATES SHE GETS INJECTIONS INTO HER KNEE  . Complication of anesthesia    BLOOD PRESSURE DROPPED WITH NASAL SURGERY, ONE OF THE CARPAL TUNNEL REPAIRS AND DURING A COLONOSCOPY  . Dyslipidemia   . History of skin cancer   . Hypertension   . Hypothyroidism   . Menopausal symptoms   . NSTEMI (non-ST elevated myocardial infarction) ([place]) 12/09/15   Medical management: Distal branch of D1 95%, ostial D2 75%, distal LAD 50%. Tortuous arteries consistent with hypertension  . Ovarian cancer ([place]) 01/2013   Recurrence since 2014/2016  . PAF (paroxysmal atrial fibrillation) ([place])    Only on Plavix -- not full Anticoagulation per pt. request; On Amiodarone (Eye Exam 12/25/15)    Past Surgical History:  Procedure Laterality Date  . BILATERAL CARPAL TUNNEL REPAIR  2007  . CARDIAC CATHETERIZATION N/A 12/05/2015   Procedure: Left Heart Cath and Coronary Angiography;  Surgeon: Belva Crome, MD;  Location: [facility];  Service: Cardiovascular;  distal branch of D1 95%, ostial D2 75%, dLAD 50%,p-mRCA 40%. Tortuous vessels.  Marland Kitchen DILATION AND CURETTAGE OF UTERUS  1969  . LAPAROTOMY Bilateral 02/13/2013   Procedure: EXPLORATORY LAPAROTOMY  TOTAL ABDOMINAL HYSTERECTOMY BILATERAL SALPINGO-OOPHORECTOMY, Partial Rectal Resection with Reanastamosis;  Surgeon: Alvino Chapel, MD;  Location: [facility];  Service: Gynecology;  Laterality: Bilateral;  . LEFT KNEE ARTHROSCOPY   2011  . LYMPHADENECTOMY Right 02/13/2013   Procedure: PEVLIC  LYMPHADENECTOMY, DEBULKING right pelvic tumor nodules;  Surgeon: Alvino Chapel, MD;  Location: [facility];  Service: Gynecology;  Laterality: Right;  . NM MYOVIEW LTD  June 2010    subbmaximal with no ischemia or infarction.  . OMENTECTOMY  02/13/2013   Procedure: OMENTECTOMY;  Surgeon: Alvino Chapel, MD;  Location: [facility];  Service: Gynecology;;  . [place]  . SURGERY FOR RUPTURED OVARIAN CYST  1969  . TAH/BSO/Tumor debulking with right pelvic LND  01/2013  . TONSILLECTOMY  1962  . TOTAL HIP ARTHROPLASTY  02/25/2012   Procedure: TOTAL HIP ARTHROPLASTY ANTERIOR APPROACH;  Surgeon: Mcarthur Rossetti, MD;  Location: [facility];  Service: Orthopedics;  Laterality: Right;  . TOTAL KNEE ARTHROPLASTY Left 01/03/2015   Procedure: LEFT TOTAL KNEE ARTHROPLASTY;  Surgeon: Mcarthur Rossetti, MD;  Location: [facility];  Service: Orthopedics;  Laterality: Left;  . TRANSTHORACIC ECHOCARDIOGRAM  May 2014; January 2016   a. Normal LV size function. EF 60-65%. Grade 1 diastolic function. Mild MR and mildly elevated PA pressures of 37 mmHg per;; b. EF 60-65%. Mobile echodensity 10 mm x 6 mm attest interventricular septum is questionable fibroblastoma.    Current Outpatient Prescriptions  Medication Sig Dispense Refill  . acetaminophen (TYLENOL) 500 MG tablet Take 1,000 mg by mouth every 6 (six) hours as needed (pain).    Marland Kitchen amiodarone (PACERONE) 200 MG tablet Take 0.5 tablets (100 mg total) by mouth daily. 45 tablet 3  . amLODipine (NORVASC) 5 MG tablet Take 2.5 mg by mouth daily.   3  . bisacodyl (DULCOLAX) 5 MG EC tablet Take 5 mg by mouth 2 (two) times daily. Reported on  12/25/2015    . desonide (DESOWEN) 0.05 % cream Apply 1 application topically 2 (two) times daily.   1  . diphenhydrAMINE (BENADRYL) 25 MG tablet Take 37.5 mg by mouth at bedtime as needed for sleep.    Marland Kitchen docusate sodium (COLACE) 100 MG capsule Take 100 mg by mouth at bedtime.     Marland Kitchen ELIQUIS 5 MG TABS tablet TAKE 1 TABLET (5 MG TOTAL) BY MOUTH 2 (TWO) TIMES DAILY. 60 tablet 5  . ezetimibe (ZETIA) 10 MG tablet Take 1 tablet (10 mg total) by mouth daily. (Patient taking differently: Take 10 mg by mouth at bedtime. ) 30 tablet 6  . Glucosamine HCl 1000 MG TABS Take 1,000 mg by mouth daily.    . hydrocortisone 1 % ointment Apply 1 application topically 2 (two) times daily as needed (eczema).    Marland Kitchen ipratropium (ATROVENT) 0.06 % nasal spray Place 2 sprays into both nostrils daily.   5  . irbesartan (AVAPRO) 300 MG tablet TAKE 1 TABLET (300 MG TOTAL) BY MOUTH DAILY. 90 tablet 2  . levothyroxine (SYNTHROID) 88 MCG tablet Take 88 mcg by mouth daily before breakfast.    . lidocaine-prilocaine (EMLA) cream Apply to Porta-Cath site 1-2 hours prior to access as directed. (Patient taking differently: Apply 1 application topically See admin instructions. Apply to [facility] site 1-2 hours prior to access as directed.) 30 g 2  . magnesium hydroxide (MILK OF MAGNESIA) 800 MG/5ML suspension Take 30 mLs by mouth daily as needed for constipation. Reported on 11/20/2015    . Melatonin 5 MG CAPS Take 5 mg by mouth at bedtime as needed (sleep). Reported on 12/25/2015    . Menthol, Topical Analgesic, (ICY HOT EX) Apply 1 application topically daily as needed (pain.).    Marland Kitchen Multiple Vitamin (MULTIVITAMIN WITH MINERALS) TABS tablet Take 1 tablet by mouth daily. Centrum Silver    . Polyethyl Glycol-Propyl Glycol (SYSTANE OP) Place 1 drop into both eyes 2 (two) times daily.     . polyethylene glycol (MIRALAX / GLYCOLAX) packet Take 17 g by mouth daily. Mix in 8 oz liquid and drink    . potassium chloride (K-DUR) 10 MEQ tablet Take  10 mEq by mouth every Monday, Wednesday, and Friday.    Marland Kitchen spironolactone (ALDACTONE) 25 MG tablet Take 25 mg by mouth. Taking TTSS     No current facility-administered medications for this visit.     Social History  Social History  . Marital status: Married    Spouse name: N/A  . Number of children: N/A  . Years of education: N/A   Occupational History  . Not on file.   Social History Main Topics  . Smoking status: Never Smoker  . Smokeless tobacco: Never Used  . Alcohol use No  . Drug use: No  . Sexual activity: Not Currently   Other Topics Concern  . Not on file   Social History Narrative   Married mother of 3, grandmother 42.   Exercises 6/7 days a week walking 30 minutes a time.   Never smoked or drank alcohol.    Family History  Problem Relation Age of Onset  . Hypertension Mother   . Basal cell carcinoma Mother   . AAA (abdominal aortic aneurysm) Father   . Heart Problems Maternal Uncle   . Heart Problems Maternal Grandfather   . AAA (abdominal aortic aneurysm) Paternal Grandmother   . Prostate cancer Maternal Uncle 70  . Prostate cancer Other   . Other Daughter     one daughter had TAH-BSO at 61; other daughter will have one soon      Marti Sleigh, MD 12/06/2016, 11:37 AM       Consult Note: Gyn-Onc   [name] 76 y.o. female  Chief Complaint  Patient presents with  . Ovarian Cancer    Assessment and plan :   Stage IIb poorly differentiated ovarian cancer currently responding to therapy with carboplatin , Taxol, and a Avastin. Poorly controlled hypertension.  The most recent CT scan is reviewed with the patient and her husband. It appears that there is continued but slight decrease size of the metastasis in the spleen. On the most recent report there is also some question about adenopathy and resolution of possible peritoneal implants in the pelvis. This is a different interpretation than what I had seen in the prior CT scan  report and raises the question as to whether the patient might have other metastatic disease. Therefore, we'll obtain a PET scan to further evaluate whether the lesion in the spleen is solitary or whether there are other metastatic sites. Clearly if there are other metastatic sites I would not recommend splenectomy. But, rather, would consider changing chemotherapy to another regimen.  We will schedule the PET scan and allow the patient have a therapeutic holiday over the Christmas holidays. She has been very happy to have time off of chemotherapy.  Interval History:   The patient returns today as previously scheduled.  Since her last visit she's received 2 additional cycles of carboplatin, Taxol, and a Avastin. We had planned 3 cycles but the third is been held because the patient's hypertension. Further, the patient reports that after each cycle she's becoming more more fatigued and more nausea and quality of life has diminished considerably.  She had a CT scan to reassess her disease status on December 8. This noted that the lesion in the spleen has had "mild" decrease in size. Currently the lesion measures 3.5 x 3.2 cm (previously 3.5 x 3.9 cm. In addition, the radiologist reports that peritoneal implants within the pelvis are no longer measurable and that there is resolution of previously enlarged right external iliac node. In addition there is a right hilar node which is borderline enlarged and increased in size when compared to scan of May 2014. Patient's most recent CA-125 is 14 (previously 12) Otherwise she has no abdominal or pelvic symptoms.  HPI:The patient initially presented  with a pelvic mass and elevated CA 125 (178 units per mL) She underwent exploratory laparotomy and debulking on 02/13/2013. Final pathology showed a poorly differentiated ovarian cancer involving both ovaries pelvic peritoneum and rectal muscularis. All gross disease was resected.  She then received 6 cycles of  carboplatin and Taxol chemotherapy completed in September 2014. At the completion of chemotherapy her CA 125 was 18 units per mL.   In May 2016 the patient CA-125 began to rise and a PET CT scan showed metastatic disease in the spleen and pelvic lymph nodes. Given the long platinum free interval , we retreated with carboplatin, Taxol, and a Avastin. She received 6 cycles of carboplatin and Taxol and a Avastin the last being administered in September 2016. She had a significant response with CA 125 falling to 12 units per mL in September. CT scan showed persistent disease in the spleen (which was actually responding). Patient received a total 8 cycles and on follow-up in December 2016 had persistent disease in the spleen which had slightly decreased in size. At that time CA-125 is 14 units per mL.   Review of Systems:10 point review of systems is negative except as noted in interval history.   Vitals: Blood pressure (!) 142/73, pulse 80, temperature 98 F (36.7 C), temperature source Oral, resp. rate 18, height _0  (1.6 m), weight 135 lb 12.8 oz (61.6 kg), SpO2 100 %.  Physical Exam: General : The patient is a healthy woman in no acute distress.  HEENT: normocephalic, extraoccular movements normal; neck is supple without thyromegally  Lynphnodes: Supraclavicular and inguinal nodes not enlarged  Abdomen: Soft, non-tender, no ascites, no organomegally, no masses, no hernias , midline incision is healing well  Pelvic:    EGBUS: Normal female  Vagina bladder urethra: Normal  Vaginal cuff is well healed.  Cervix and uterus are surgically absent  Bimanual exam: No masses nodularity or fullness.  Rectovaginal exam confirms      Lower extremities: No edema or varicosities. Normal range of motion      Allergies  Allergen Reactions  . Coreg [Carvedilol] Shortness Of Breath  . Caffeine Other (See Comments)    Makes heart race  . Ceftin [Cefuroxime Axetil]     Interferes with  propafenone.   (Note pt is no longer taking propafenone 06/04/16)  . Clonidine Derivatives Other (See Comments)    Pt does not remember what the reaction was  . Codeine Other (See Comments)    Does not like the feeling she gets  . Flexeril [Cyclobenzaprine] Other (See Comments)    Pt states "increased heart rate"  . Levaquin [Levofloxacin In D5w]     Interferes with propafenone.  (note: pt is no longer taking propafenone 06/04/16)  . Lipitor [Atorvastatin] Dermatitis    "feels like bugs are biting her"   . Lisinopril     LIP NUMBNESS  . Pravastatin Other (See Comments)    FEELS LIKE "BUG BITES" BITING HER LEGS   . Tape Rash    TEGADERM.   (use opsite on PAC)  . Tegaderm Ag Mesh [Silver] Rash and Other (See Comments)    Burns skin  . Ultram [Tramadol] Palpitations    Past Medical History:  Diagnosis Date  . Arthritis    OSTEOARTHRITIS   -- CONSTANT PAIN RIGHT HIP---AND PAIN LEFT KNEE--PT STATES SHE GETS INJECTIONS INTO HER KNEE  . Complication of anesthesia    BLOOD PRESSURE DROPPED WITH NASAL SURGERY, ONE OF THE CARPAL TUNNEL REPAIRS AND DURING  A COLONOSCOPY  . Dyslipidemia   . History of skin cancer   . Hypertension   . Hypothyroidism   . Menopausal symptoms   . NSTEMI (non-ST elevated myocardial infarction) ([place]) 12/09/15   Medical management: Distal branch of D1 95%, ostial D2 75%, distal LAD 50%. Tortuous arteries consistent with hypertension  . Ovarian cancer ([place]) 01/2013   Recurrence since 2014/2016  . PAF (paroxysmal atrial fibrillation) ([place])    Only on Plavix -- not full Anticoagulation per pt. request; On Amiodarone (Eye Exam 12/25/15)    Past Surgical History:  Procedure Laterality Date  . BILATERAL CARPAL TUNNEL REPAIR  2007  . CARDIAC CATHETERIZATION N/A 12/05/2015   Procedure: Left Heart Cath and Coronary Angiography;  Surgeon: Belva Crome, MD;  Location: [facility];  Service: Cardiovascular;  distal branch of D1 95%, ostial D2 75%, dLAD 50%,p-mRCA 40%.  Tortuous vessels.  Marland Kitchen DILATION AND CURETTAGE OF UTERUS  1969  . LAPAROTOMY Bilateral 02/13/2013   Procedure: EXPLORATORY LAPAROTOMY TOTAL ABDOMINAL HYSTERECTOMY BILATERAL SALPINGO-OOPHORECTOMY, Partial Rectal Resection with Reanastamosis;  Surgeon: Alvino Chapel, MD;  Location: [facility];  Service: Gynecology;  Laterality: Bilateral;  . LEFT KNEE ARTHROSCOPY   2011  . LYMPHADENECTOMY Right 02/13/2013   Procedure: PEVLIC  LYMPHADENECTOMY, DEBULKING right pelvic tumor nodules;  Surgeon: Alvino Chapel, MD;  [facility]: [facility];  Service: Gynecology;  Laterality: Right;  . NM MYOVIEW LTD  June 2010    subbmaximal with no ischemia or infarction.  . OMENTECTOMY  02/13/2013   Procedure: OMENTECTOMY;  Surgeon: Alvino Chapel, MD;  Location: [facility];  Service: Gynecology;;  . [place]  . SURGERY FOR RUPTURED OVARIAN CYST  1969  . TAH/BSO/Tumor debulking with right pelvic LND  01/2013  . TONSILLECTOMY  1962  . TOTAL HIP ARTHROPLASTY  02/25/2012   Procedure: TOTAL HIP ARTHROPLASTY ANTERIOR APPROACH;  Surgeon: Mcarthur Rossetti, MD;  [facility]: [facility];  Service: Orthopedics;  Laterality: Right;  . TOTAL KNEE ARTHROPLASTY Left 01/03/2015   Procedure: LEFT TOTAL KNEE ARTHROPLASTY;  Surgeon: Mcarthur Rossetti, MD;  [facility];  Service: Orthopedics;  Laterality: Left;  . TRANSTHORACIC ECHOCARDIOGRAM  May 2014; January 2016   a. Normal LV size function. EF 60-65%. Grade 1 diastolic function. Mild MR and mildly elevated PA pressures of 37 mmHg per;; b. EF 60-65%. Mobile echodensity 10 mm x 6 mm attest interventricular septum is questionable fibroblastoma.    Current Outpatient Prescriptions  Medication Sig Dispense Refill  . acetaminophen (TYLENOL) 500 MG tablet Take 1,000 mg by mouth every 6 (six) hours as needed (pain).    Marland Kitchen amiodarone (PACERONE) 200 MG tablet Take 0.5 tablets (100 mg total) by mouth daily. 45 tablet 3  . amLODipine (NORVASC) 5 MG  tablet Take 2.5 mg by mouth daily.   3  . bisacodyl (DULCOLAX) 5 MG EC tablet Take 5 mg by mouth 2 (two) times daily. Reported on 12/25/2015    . desonide (DESOWEN) 0.05 % cream Apply 1 application topically 2 (two) times daily.   1  . diphenhydrAMINE (BENADRYL) 25 MG tablet Take 37.5 mg by mouth at bedtime as needed for sleep.    Marland Kitchen docusate sodium (COLACE) 100 MG capsule Take 100 mg by mouth at bedtime.     Marland Kitchen ELIQUIS 5 MG TABS tablet TAKE 1 TABLET (5 MG TOTAL) BY MOUTH 2 (TWO) TIMES DAILY. 60 tablet 5  . ezetimibe (ZETIA) 10 MG tablet Take 1 tablet (10 mg  total) by mouth daily. (Patient taking differently: Take 10 mg by mouth at bedtime. ) 30 tablet 6  . Glucosamine HCl 1000 MG TABS Take 1,000 mg by mouth daily.    . hydrocortisone 1 % ointment Apply 1 application topically 2 (two) times daily as needed (eczema).    Marland Kitchen ipratropium (ATROVENT) 0.06 % nasal spray Place 2 sprays into both nostrils daily.   5  . irbesartan (AVAPRO) 300 MG tablet TAKE 1 TABLET (300 MG TOTAL) BY MOUTH DAILY. 90 tablet 2  . levothyroxine (SYNTHROID) 88 MCG tablet Take 88 mcg by mouth daily before breakfast.    . lidocaine-prilocaine (EMLA) cream Apply to Porta-Cath site 1-2 hours prior to access as directed. (Patient taking differently: Apply 1 application topically See admin instructions. Apply to [facility] site 1-2 hours prior to access as directed.) 30 g 2  . magnesium hydroxide (MILK OF MAGNESIA) 800 MG/5ML suspension Take 30 mLs by mouth daily as needed for constipation. Reported on 11/20/2015    . Melatonin 5 MG CAPS Take 5 mg by mouth at bedtime as needed (sleep). Reported on 12/25/2015    . Menthol, Topical Analgesic, (ICY HOT EX) Apply 1 application topically daily as needed (pain.).    Marland Kitchen Multiple Vitamin (MULTIVITAMIN WITH MINERALS) TABS tablet Take 1 tablet by mouth daily. Centrum Silver    . Polyethyl Glycol-Propyl Glycol (SYSTANE OP) Place 1 drop into both eyes 2 (two) times daily.     . polyethylene glycol  (MIRALAX / GLYCOLAX) packet Take 17 g by mouth daily. Mix in 8 oz liquid and drink    . potassium chloride (K-DUR) 10 MEQ tablet Take 10 mEq by mouth every Monday, Wednesday, and Friday.    Marland Kitchen spironolactone (ALDACTONE) 25 MG tablet Take 25 mg by mouth. Taking TTSS     No current facility-administered medications for this visit.     Social History   Social History  . Marital status: Married    Spouse name: N/A  . Number of children: N/A  . Years of education: N/A   Occupational History  . Not on file.   Social History Main Topics  . Smoking status: Never Smoker  . Smokeless tobacco: Never Used  . Alcohol use No  . Drug use: No  . Sexual activity: Not Currently   Other Topics Concern  . Not on file   Social History Narrative   Married mother of 3, grandmother 45.   Exercises 6/7 days a week walking 30 minutes a time.   Never smoked or drank alcohol.    Family History  Problem Relation Age of Onset  . Hypertension Mother   . Basal cell carcinoma Mother   . AAA (abdominal aortic aneurysm) Father   . Heart Problems Maternal Uncle   . Heart Problems Maternal Grandfather   . AAA (abdominal aortic aneurysm) Paternal Grandmother   . Prostate cancer Maternal Uncle 70  . Prostate cancer Other   . Other Daughter     one daughter had TAH-BSO at 82; other daughter will have one soon      Marti Sleigh, MD 12/06/2016, 11:37 AM

## 2016-12-06 NOTE — Patient Instructions (Signed)
Doing great!  Plan to follow up with Dr. Fermin Schwab in May 2018 or sooner if needed.  Please call for any questions or concerns.

## 2016-12-07 ENCOUNTER — Other Ambulatory Visit: Payer: Self-pay | Admitting: Gynecologic Oncology

## 2016-12-07 ENCOUNTER — Telehealth: Payer: Self-pay | Admitting: *Deleted

## 2016-12-07 DIAGNOSIS — C569 Malignant neoplasm of unspecified ovary: Secondary | ICD-10-CM

## 2016-12-07 NOTE — Telephone Encounter (Signed)
md out of office 5/4 called pt to r/s 5/4 appt to 5/18 9am. lmovm for pt with above information and requested call back to confirm.

## 2016-12-09 ENCOUNTER — Other Ambulatory Visit: Payer: Self-pay | Admitting: Cardiology

## 2016-12-18 ENCOUNTER — Other Ambulatory Visit: Payer: Self-pay | Admitting: Oncology

## 2016-12-18 DIAGNOSIS — C7989 Secondary malignant neoplasm of other specified sites: Secondary | ICD-10-CM

## 2016-12-20 ENCOUNTER — Ambulatory Visit (HOSPITAL_BASED_OUTPATIENT_CLINIC_OR_DEPARTMENT_OTHER): Payer: Medicare Other | Admitting: Oncology

## 2016-12-20 ENCOUNTER — Ambulatory Visit (HOSPITAL_BASED_OUTPATIENT_CLINIC_OR_DEPARTMENT_OTHER): Payer: Medicare Other

## 2016-12-20 ENCOUNTER — Encounter: Payer: Self-pay | Admitting: Oncology

## 2016-12-20 ENCOUNTER — Other Ambulatory Visit (HOSPITAL_BASED_OUTPATIENT_CLINIC_OR_DEPARTMENT_OTHER): Payer: Medicare Other

## 2016-12-20 VITALS — BP 117/59 | HR 77 | Temp 98.1°F | Resp 18 | Ht 63.0 in | Wt 135.1 lb

## 2016-12-20 DIAGNOSIS — C562 Malignant neoplasm of left ovary: Secondary | ICD-10-CM | POA: Diagnosis not present

## 2016-12-20 DIAGNOSIS — I10 Essential (primary) hypertension: Secondary | ICD-10-CM

## 2016-12-20 DIAGNOSIS — I480 Paroxysmal atrial fibrillation: Secondary | ICD-10-CM

## 2016-12-20 DIAGNOSIS — Z8673 Personal history of transient ischemic attack (TIA), and cerebral infarction without residual deficits: Secondary | ICD-10-CM

## 2016-12-20 DIAGNOSIS — M25562 Pain in left knee: Secondary | ICD-10-CM

## 2016-12-20 DIAGNOSIS — C561 Malignant neoplasm of right ovary: Secondary | ICD-10-CM

## 2016-12-20 DIAGNOSIS — I252 Old myocardial infarction: Secondary | ICD-10-CM

## 2016-12-20 DIAGNOSIS — G620 Drug-induced polyneuropathy: Secondary | ICD-10-CM

## 2016-12-20 DIAGNOSIS — E039 Hypothyroidism, unspecified: Secondary | ICD-10-CM

## 2016-12-20 DIAGNOSIS — Z95828 Presence of other vascular implants and grafts: Secondary | ICD-10-CM

## 2016-12-20 DIAGNOSIS — C569 Malignant neoplasm of unspecified ovary: Secondary | ICD-10-CM

## 2016-12-20 DIAGNOSIS — C5702 Malignant neoplasm of left fallopian tube: Secondary | ICD-10-CM

## 2016-12-20 DIAGNOSIS — C7989 Secondary malignant neoplasm of other specified sites: Secondary | ICD-10-CM

## 2016-12-20 DIAGNOSIS — C5701 Malignant neoplasm of right fallopian tube: Secondary | ICD-10-CM

## 2016-12-20 DIAGNOSIS — M25551 Pain in right hip: Secondary | ICD-10-CM

## 2016-12-20 DIAGNOSIS — R1031 Right lower quadrant pain: Secondary | ICD-10-CM

## 2016-12-20 DIAGNOSIS — Z7901 Long term (current) use of anticoagulants: Secondary | ICD-10-CM

## 2016-12-20 DIAGNOSIS — D696 Thrombocytopenia, unspecified: Secondary | ICD-10-CM

## 2016-12-20 DIAGNOSIS — K5900 Constipation, unspecified: Secondary | ICD-10-CM

## 2016-12-20 LAB — CBC WITH DIFFERENTIAL/PLATELET
BASO%: 0.9 % (ref 0.0–2.0)
BASOS ABS: 0 10*3/uL (ref 0.0–0.1)
EOS%: 0.6 % (ref 0.0–7.0)
Eosinophils Absolute: 0 10*3/uL (ref 0.0–0.5)
HCT: 33.6 % — ABNORMAL LOW (ref 34.8–46.6)
HGB: 11.6 g/dL (ref 11.6–15.9)
LYMPH#: 1.3 10*3/uL (ref 0.9–3.3)
LYMPH%: 31 % (ref 14.0–49.7)
MCH: 34.7 pg — AB (ref 25.1–34.0)
MCHC: 34.5 g/dL (ref 31.5–36.0)
MCV: 100.5 fL (ref 79.5–101.0)
MONO#: 0.5 10*3/uL (ref 0.1–0.9)
MONO%: 11.2 % (ref 0.0–14.0)
NEUT#: 2.4 10*3/uL (ref 1.5–6.5)
NEUT%: 56.3 % (ref 38.4–76.8)
PLATELETS: 140 10*3/uL — AB (ref 145–400)
RBC: 3.34 10*6/uL — AB (ref 3.70–5.45)
RDW: 13.3 % (ref 11.2–14.5)
WBC: 4.3 10*3/uL (ref 3.9–10.3)

## 2016-12-20 MED ORDER — SODIUM CHLORIDE 0.9 % IJ SOLN
10.0000 mL | INTRAMUSCULAR | Status: DC | PRN
  Administered 2016-12-20: 10 mL via INTRAVENOUS
  Filled 2016-12-20: qty 10

## 2016-12-20 MED ORDER — HEPARIN SOD (PORK) LOCK FLUSH 100 UNIT/ML IV SOLN
500.0000 [IU] | Freq: Once | INTRAVENOUS | Status: AC | PRN
  Administered 2016-12-20: 500 [IU] via INTRAVENOUS
  Filled 2016-12-20: qty 5

## 2016-12-20 NOTE — Progress Notes (Signed)
OFFICE PROGRESS NOTE   December 21, 2016   Physicians:  D. ClarkePearson; J.Russo, T.Fontaine, Glenetta Hew (cardiology), [name], D.Jacobs, Zollie Beckers, P.Sethi, J.Moody, D.Shoemaker  INTERVAL HISTORY:   Patient is seen, alone for visit, in scheduled follow up of recurrent ovarian cancer, on observation since she decided to stop anastrozole in early Dec 2-17, after about 6 weeks on that medication. She saw Dr Josephina Shih 12-06-16 and will see him again in 03-2017. She had CT AP 12-01-16, without findings of concern for malignancy. She still has PAC. She will see Dr Virgina Jock for physical exam in 01-2017.  Patient reports that she feels much better overall since discontinuing the aromatase inhibitor, with improved energy, enjoying handbells, choir, and yard work.   Left knee pain may be a little better off of the arimidex, tho not clear that pain was related to AI, however she has more persistent right hip and right groin discomfort. She had right hip replacement by Dr Ninfa Linden 2013 and is to see him 12-22-16. She has significant degenerative changes in LS as well as scoliosis.  Chronic constipation is unchanged, with large stool volume noted on recent CT. She has no different abdominal discomfort, no nausea or vomiting,no bleeding, no recent infectious illness, no LE swelling, no SOB or other respiratory symptoms. No problems with PAC. No symptoms of TIAs or other neurologic problems. Remainder of 14 point Review of Systems negative.   Flu vaccine 09-13-16 PAC flushed 12-20-16 Genetics testing normal 06-26-15 (Breast Ovarian Panel by [facility]) CA 125 on 05-01-15 33 Bone density scan 10-2014 normal Path 01-2013 ER PR +  ONCOLOGIC HISTORY Patient presented in late Jan 2014 with 2 weeks of spotting in early Dec 2015. Sonohystogram 01-05-13 showed uterus normal size and echotexture, endometrium 4.3 mm, left ovary normal and right adnexa with 1.1 x 8.4.x8.2 cm cystic and solid mass. Endometrial biopsy  benign and CA 125 also on 01-05-13 was 178.8. CT AP 01-17-13 with 1.0 x 6.9 x 8.9 cm complex right ovarian mass, no ascites, small retroperitoneal nodes. She was seen by Dr Skeet Latch on 01-18-13 and taken to surgery by Dr Josephina Shih on 02-13-13, which was TAH/BSO/ omentectomy/ureterolysis/ resection of cul-de-sac tumor/right pelvic lymphadnectomy and resection of rectum with reanastomosis. At completion of surgery there was no gross residual disease. 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 Washings [phone]) had rare clusters of atypical cells. Chemotherapy with dose dense taxol carboplatin was begun day 1 cycle 1 on 03-20-13; ANC was 1.1 on day 15 cycle 1 with taxol given and neupogen added 04-04-13. She had day 1 cycle 2 treatment on 04-17-13, then was briefly hospitalized 5-22 to 04-20-13 after syncopal episode, with antihypertensive agents DCd and UTI treated. She was feeling much better at time of "day 8" cycle 2 on 05-01-13 and did have neupogen 300 mcg x 1 dose on 05-02-13. She was readmitted to hospital 6-5 thru 05-05-13 with fever, empirically on antibiotics and blood cultures negative. Cycle 6 completed 08-14-13. CT AP 09-25-13 had no evidence of cancer and CA 125 was 18.3 on 07-24-2013. Marker was 20 in early Dec 2015, 26 on 01-31-15 and 35 on 03-14-15. PET 04-03-15 documented disease bilateral pelvic sidewalls, vaginal cuff and scattered areas thru abdomen/ pelvis, as well as area of uptake in spleen. She resumed carboplatin taxol using q  3 week regimen on 05-01-15. She was neutropenic with ANC 0.3 on day 9 despite beginning granix on day 5. She had 6 cycles of carbo taxol from 05-01-15 thru 08-25-15, with avastin cycles 2,3,4, 6. CT AP 09-10-15 showed resolution of  involvement other than in spleen, which was improved. She saw Dr Josephina Shih on 09-12-15 , with discussion of splenectomy followed by avastin vs 3 additional cycles of chemo + avastin then reassess. Patient preferred additional treatment to splenectomy, with carbo taxol avastin resumed 09-29-15 and treated again 10-21-15. She tolerated treatment progressively more poorly, such that 1 planned additional cycle was held. CT CAP 11-06-15 and PET 11-18-15 showed persistent disease in area of splenic hilum (between stomach and spleen) and at 1 cm right pelvic sidewall node.  She had NSTEMI with marked HTN early Jan 2017, then CVA. She stopped chronic estrogen late Jan and began letrozole late March 2017. CT AP 06-23-16 showed significant improvement in splenic met. Patient preferred change from letrozole to arimidex in late 08-2016 due to concern about pain in knees etc. She preferred to stop arimidex early 10-2017.   Objective:  Vital signs in last 24 hours:  BP (!) 117/59 (BP Location: Left Arm, Patient Position: Sitting)   Pulse 77   Temp 98.1 F (36.7 C) (Oral)   Resp 18   Ht 5' 3"  (1.6 m)   Wt 135 lb 1.6 oz (61.3 kg)   SpO2 100%   BMI 23.93 kg/m  Weight up 2 lbs Alert, oriented and appropriate, looks comfortable and very cheerful today. Ambulatory with some effort, favoring RLE.   HEENT:PERRL, sclerae not icteric. Oral mucosa moist without lesions, posterior pharynx clear.  Neck supple. No JVD.  Lymphatics:no cervical,supraclavicular, axillary or inguinal adenopathy Resp: clear to auscultation bilaterally and normal percussion bilaterally Cardio: regular rate and rhythm. No gallop. GI: soft, nontender, somewhat full thruout but not distended, no mass or organomegaly. Normally active bowel sounds. Surgical incision not remarkable. Musculoskeletal/ Extremities: without pitting edema, cords, tenderness Neuro: speech fluent, otherwise nonfocal. PSYCH appropriate mood and affect Skin without  rash, ecchymosis, petechiae Portacath-without erythema or tenderness  Lab Results:  Results for orders placed or performed in visit on 12/20/16  CBC with Differential  Result Value Ref Range   WBC 4.3 3.9 - 10.3 10e3/uL   NEUT# 2.4 1.5 - 6.5 10e3/uL   HGB 11.6 11.6 - 15.9 g/dL   HCT 33.6 (L) 34.8 - 46.6 %   Platelets 140 (L) 145 - 400 10e3/uL   MCV 100.5 79.5 - 101.0 fL   MCH 34.7 (H) 25.1 - 34.0 pg   MCHC 34.5 31.5 - 36.0 g/dL   RBC 3.34 (L) 3.70 - 5.45 10e6/uL   RDW 13.3 11.2 - 14.5 %   lymph# 1.3 0.9 - 3.3 10e3/uL   MONO# 0.5 0.1 - 0.9 10e3/uL   Eosinophils Absolute 0.0 0.0 - 0.5 10e3/uL   Basophils Absolute 0.0 0.0 - 0.1 10e3/uL   NEUT% 56.3 38.4 - 76.8 %   LYMPH% 31.0 14.0 - 49.7 %   MONO% 11.2 0.0 - 14.0 %   EOS% 0.6 0.0 - 7.0 %   BASO% 0.9 0.0 - 2.0 %   *Note: Due to a large number of results and/or encounters for the requested time period, some results have not been displayed. A complete set of results can be found in Results Review.    Last CMET in this EMR 11-24-16 normal with exception of EGFR 76.  CA 125 on 11-24-16  143, this having been 18.4 in 09-2016 and 14.6 in 05-2016  Studies/Results: CT ABDOMEN AND PELVIS WITH CONTRAST  12-01-16  COMPARISON:  06/23/2016  FINDINGS: Lower chest: The lung bases are clear of acute process. No pleural effusion or pulmonary lesions. The heart is normal in size. No pericardial effusion. The distal esophagus and aorta are unremarkable.  Hepatobiliary: No focal hepatic lesions or intrahepatic biliary dilatation. Mild irregularity of the gallbladder wall could be due to adenomyomatosis. No common bile duct dilatation.  Pancreas: No mass, inflammation or ductal dilatation.  Spleen: Normal size.  No focal lesions.  Adrenals/Urinary Tract: The adrenal glands and kidneys are unremarkable and stable.  Stomach/Bowel: Small hiatal hernia. The stomach is otherwise unremarkable. The duodenum, small bowel and colon are  unremarkable. No inflammatory changes, mass lesions or evidence of peritoneal surface disease. There is a large amount of stool throughout the colon and down into the rectum suggesting constipation.  Vascular/Lymphatic: Stable aortic and iliac artery calcifications but no aneurysm or dissection. The branch vessels are patent. The major venous structures are patent.  No mesenteric or retroperitoneal mass or adenopathy. No omental or peritoneal surface disease is identified. No residual tumor in the gastrosplenic ligament area.  Reproductive: The uterus and ovaries are surgically absent.  Other: No pelvic mass or adenopathy. No free pelvic fluid collections. No inguinal mass or adenopathy. No abdominal wall hernia or subcutaneous lesions. Moderate artifact from the right hip prosthesis.  Musculoskeletal: No significant bony findings. Scoliosis and advanced degenerative lumbar spondylosis.  IMPRESSION: 1. No CT findings for residual or recurrent abdominal/pelvic ovarian cancer. 2. No acute abdominal/pelvic findings. 3. Large amount of stool throughout the colon and down into the rectum suggesting constipation. 4. Stable atherosclerotic calcifications involving the aorta and iliac arteries.  Medications: I have reviewed the patient's current medications.  DISCUSSION Patient is pleased with CT information re gyn cancer and with plan to follow up with Dr Josephina Shih in May, with CA 125 then. She hopes that being off estrogen will be of some benefit even without other hormonal blockers for the ER+ ovarian cancer.   She is reluctant to have PAC removed, tho we discussed risk of infections or blood clots since it is not being used for treatment now. She understands that this should be flushed every 6-8 weeks when not otherwise used, which will be in March, then with Dr Tamela Oddi visit in May.  I have tentatively requested follow up with medical oncology coordinating with PAC  flush after May.    Assessment/Plan:  1.Recurrent ovarian carcinoma: on observation since she discontinued aromatase inhibitor in 10-2016. CT 12-01-16 no obvious active cancer and CA 125 staying in low range. Clinically doing well. Follow up as above.   IIB poorly differentiated serous involving bilateral ovaries and tubes at optimal radical debulking 02-13-13. Adjuvant carboplatin taxol completed 08-14-2013. Progressive disease spring 2016, treated with carbo taxol avastin thru 09-2015, then letrozole / arimidex thru 10-2016.  3.NSTEMI with marked HTN on 12-03-15, then acute CVA ~ 12-07-15 either from cath or PAF. Now on Eliquis, cardiology and neurology involved . HTN previouslyexacerbated by avastin, back to baseline since last avastin 10-21-15 4.paroxysmal Afib: followed by cardiology, on Eliquis 5.minimal thrombocytopenia unchanged.  No bleeding.Note on Eliquis 6.chronic constipation, up to date on colonoscopy. Marked constipation by imaging, despite laxative regimen. 7..hypothyroid on replacement by PCP. Diffuse uptake left lobe thyroid by scans, per radiologist consistent with thyroiditis 8. Long estrogen replacement: Surgical path 02-13-13 ER PR positive (even tho was on  same estrogen at that time). Patient DCd estrogen Jan 2017. She is not complaining of hot flashes now. 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 Up to date mammograms at [facility] 04-06-16, scattered fibroglandular density noted. 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 11. Normal bone density by scan at Dr [facility] office [phone]. Should have this repeated ~ 10-2016 particularly if still on letrozole then 12.PAC in : flush every 6-8 weeks when not otherwise used. 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 Right hip replacement 2013, more symptomatic, to see orthopedics. LS DJD and scoliosis  All questions answered and she is in agreement with plans above, knows to call if needed prior to scheduled  appointments. Time spent 20 min including >50% counseling and coordination of care.Cc Dr Virgina Jock with recent CT report, Dr Cira Servant, MD   12/21/2016, 7:16 PM

## 2016-12-22 ENCOUNTER — Ambulatory Visit (INDEPENDENT_AMBULATORY_CARE_PROVIDER_SITE_OTHER): Payer: Medicare Other | Admitting: Orthopaedic Surgery

## 2016-12-22 DIAGNOSIS — M25562 Pain in left knee: Secondary | ICD-10-CM | POA: Diagnosis not present

## 2016-12-22 DIAGNOSIS — M7061 Trochanteric bursitis, right hip: Secondary | ICD-10-CM | POA: Diagnosis not present

## 2016-12-22 MED ORDER — LIDOCAINE HCL 1 % IJ SOLN
3.0000 mL | INTRAMUSCULAR | Status: AC | PRN
  Administered 2016-12-22: 3 mL

## 2016-12-22 MED ORDER — METHYLPREDNISOLONE ACETATE 40 MG/ML IJ SUSP
40.0000 mg | INTRAMUSCULAR | Status: AC | PRN
  Administered 2016-12-22: 40 mg via INTRA_ARTICULAR

## 2016-12-22 NOTE — Progress Notes (Signed)
Office Visit Note   Patient: [name]           Date of Birth: Apr 02, 1941           MRN: [id number] Visit Date: 12/22/2016              Requested by: Shon Baton, MD [place], [place] [id number] PCP: Precious Reel, MD   Assessment & Plan: Visit Diagnoses:  1. Acute pain of left knee   2. Trochanteric bursitis, right hip     Plan: She tolerated the steroid injection of her right hip trochanteric area well. Her left knee upper elevated and I'll call and aspirated bloody hematoma fluid from the knee. Negative about 20 mL off and this gave her some relief. This may be related to her blood thinning medication. I would like see her back in 2 weeks to see how she doing overall.  Follow-Up Instructions: Return in about 2 weeks (around 01/05/2017).   Orders:  No orders of the defined types were placed in this encounter.  No orders of the defined types were placed in this encounter.     Procedures: Large Joint Inj Date/Time: 12/22/2016 2:52 PM Performed by: Mcarthur Rossetti Authorized by: Mcarthur Rossetti   Location:  Hip Site:  R greater trochanter Ultrasound Guidance: No   Fluoroscopic Guidance: No   Arthrogram: No   Medications:  3 mL lidocaine 1 %; 40 mg methylPREDNISolone acetate 40 MG/ML     Clinical Data: No additional findings.   Subjective: No chief complaint on file. The patient is well-known to me. She has a remote history of a left total knee replacement and a right total hip arthroplasty. Better cancer surveillance is now cancer free. She's been having acute on chronic right hip pain as well as left knee swelling. The swelling is been just recently. This is on the knee that we did perform a knee replacement surgery on. She is on L [place] is a blood thinner. She denies any trauma to the knee. The hip pain is on the lateral aspect of her hip as well as some in the groin.  HPI  Review of Systems She denies any headache, chest  pain, shortness of breath, fever, chills, nausea, vomiting.  Objective: Vital Signs: There were no vitals taken for this visit.  Physical Exam He is alert and oriented 3 Ortho Exam Image of right hip shows full range of motion as he sits down with good active and passive motion. Her pain is mainly over the trochanteric area of her hip. Her left knee does show a moderate effusion. She has good range of motion in is no redness. The knee feels stable. Specialty Comments:  No specialty comments available.  Imaging: No results found.   PMFS History: Patient Active Problem List   Diagnosis Date Noted  . Thrombocytopenia ([place]) 03/27/2016  . High risk medications (not anticoagulants) long-term use 03/27/2016  . Chronic anticoagulation 03/27/2016  . Atherosclerotic heart disease of native coronary artery without angina pectoris 03/03/2016  . Bilateral hearing loss 02/16/2016  . Bilateral impacted cerumen 02/16/2016  . Rhinitis, chronic 02/16/2016  . Embolic stroke ([place]) [id number]  . Vaginal atrophy 12/29/2015  . On amiodarone therapy 12/23/2015  . Dyslipidemia, goal LDL below 70   . Stroke with cerebral ischemia ([place])   . Visual disturbance   . Cerebral thrombosis with cerebral infarction 12/08/2015  . NSTEMI (non-ST elevated myocardial infarction) ([place]) 12/04/2015  . 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 Chemotherapy-induced peripheral neuropathy ([place]) 07/23/2015  . Portacath in place 07/23/2015  . Recurrent carcinoma of ovary ([place]) 07/23/2015  . International Federation of Gynecology and Obstetrics (FIGO) 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[place]) 07/23/2015  . Genetic testing 07/14/2015  . Encounter for antineoplastic chemotherapy 06/25/2015  . Port catheter in place 06/25/2015  . Antineoplastic chemotherapy induced pancytopenia ([place]) 06/02/2015  . Dehydration 06/02/2015  . 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 Chemotherapy  induced neutropenia ([place]) 05/13/2015  . Chemotherapy induced thrombocytopenia 05/13/2015  . Hyperbilirubinemia 05/06/2015  . Osteoarthritis of left knee 01/03/2015  . Status post total left knee replacement 01/03/2015  . PAF (paroxysmal atrial fibrillation) ([place]): Symptomatic - Rhythm control with Amiodarone. CHA2DS2Vasc Score 7: On Eliquis   . Fever 05/03/2013    Class: Acute  . Nausea 05/03/2013    Class: Acute  . Constipation 05/03/2013    Class: Acute  . Syncope, history of 04/19/2013  . Essential hypertension 04/19/2013  . Hypothyroidism 04/19/2013  . Degenerative arthritis of hip 02/25/2012   Past Medical History:  Diagnosis Date  . Arthritis    OSTEOARTHRITIS   -- CONSTANT PAIN RIGHT HIP---AND PAIN LEFT KNEE--PT STATES SHE GETS INJECTIONS INTO HER KNEE  . Complication of anesthesia    BLOOD PRESSURE DROPPED WITH NASAL SURGERY, ONE OF THE CARPAL TUNNEL REPAIRS AND DURING A COLONOSCOPY  . Dyslipidemia   . History of skin cancer   . Hypertension   . Hypothyroidism   . Menopausal symptoms   . NSTEMI (non-ST elevated myocardial infarction) ([place]) 12/09/15   Medical management: Distal branch of D1 95%, ostial D2 75%, distal LAD 50%. Tortuous arteries consistent with hypertension  . Ovarian cancer ([place]) 01/2013   Recurrence since 2014/2016  . PAF (paroxysmal atrial fibrillation) ([facility])    Only on Plavix -- not full Anticoagulation per pt. request; On Amiodarone (Eye Exam 12/25/15)    Family History  Problem Relation Age of Onset  . Hypertension Mother   . Basal cell carcinoma Mother   . AAA (abdominal aortic aneurysm) Father   . Heart Problems Maternal Uncle   . Heart Problems Maternal Grandfather   . AAA (abdominal aortic aneurysm) Paternal Grandmother   . Prostate cancer Maternal Uncle 70  . Prostate cancer Other   . Other Daughter     one daughter had TAH-BSO at 38; other daughter will have one soon    Past Surgical History:  Procedure Laterality Date  . BILATERAL  CARPAL TUNNEL REPAIR  2007  . CARDIAC CATHETERIZATION N/A 12/05/2015   Procedure: Left Heart Cath and Coronary Angiography;  Surgeon: Belva Crome, MD;  Location: [facility];  Service: Cardiovascular;  distal branch of D1 95%, ostial D2 75%, dLAD 50%,p-mRCA 40%. Tortuous vessels.  Marland Kitchen DILATION AND CURETTAGE OF UTERUS  1969  . LAPAROTOMY Bilateral 02/13/2013   Procedure: EXPLORATORY LAPAROTOMY TOTAL ABDOMINAL HYSTERECTOMY BILATERAL SALPINGO-OOPHORECTOMY, Partial Rectal Resection with Reanastamosis;  Surgeon: Alvino Chapel, MD;  Location: [facility];  Service: Gynecology;  Laterality: Bilateral;  . LEFT KNEE ARTHROSCOPY   2011  . LYMPHADENECTOMY Right 02/13/2013   Procedure: PEVLIC  LYMPHADENECTOMY, DEBULKING right pelvic tumor nodules;  Surgeon: Alvino Chapel, MD;  Location: [facility];  Service: Gynecology;  Laterality: Right;  . NM MYOVIEW LTD  June 2010    subbmaximal with no ischemia or infarction.  . OMENTECTOMY  02/13/2013   Procedure: OMENTECTOMY;  Surgeon: Alvino Chapel, MD;  Location: [facility];  Service: Gynecology;;  . RHINOPLASTY FOR FRACTURED NOSE  1986  . SURGERY FOR RUPTURED OVARIAN CYST  1969  . TAH/BSO/Tumor debulking with right pelvic LND  01/2013  . TONSILLECTOMY  1962  . TOTAL HIP ARTHROPLASTY  02/25/2012   Procedure: TOTAL HIP ARTHROPLASTY ANTERIOR APPROACH;  Surgeon: Mcarthur Rossetti, MD;  Location: [facility];  Service: Orthopedics;  Laterality: Right;  . TOTAL KNEE ARTHROPLASTY Left 01/03/2015   Procedure: LEFT TOTAL KNEE ARTHROPLASTY;  Surgeon: Mcarthur Rossetti, MD;  Location: [facility];  Service: Orthopedics;  Laterality: Left;  . TRANSTHORACIC ECHOCARDIOGRAM  May 2014; January 2016   a. Normal LV size function. EF 60-65%. Grade 1 diastolic function. Mild MR and mildly elevated PA pressures of 37 mmHg per;; b. EF 60-65%. Mobile echodensity 10 mm x 6 mm attest interventricular septum is questionable fibroblastoma.   Social History   Occupational  History  . Not on file.   Social History Main Topics  . Smoking status: Never Smoker  . Smokeless tobacco: Never Used  . Alcohol use No  . Drug use: No  . Sexual activity: Not Currently

## 2016-12-25 ENCOUNTER — Other Ambulatory Visit: Payer: Self-pay | Admitting: Cardiology

## 2017-01-05 ENCOUNTER — Telehealth (INDEPENDENT_AMBULATORY_CARE_PROVIDER_SITE_OTHER): Payer: Self-pay | Admitting: Orthopaedic Surgery

## 2017-01-05 NOTE — Telephone Encounter (Signed)
Returned call to patient concerning confirming her appt. Left message for a return call.

## 2017-01-06 ENCOUNTER — Ambulatory Visit (INDEPENDENT_AMBULATORY_CARE_PROVIDER_SITE_OTHER): Payer: Medicare Other | Admitting: Physician Assistant

## 2017-01-12 ENCOUNTER — Ambulatory Visit (INDEPENDENT_AMBULATORY_CARE_PROVIDER_SITE_OTHER): Payer: Medicare Other | Admitting: Physician Assistant

## 2017-01-12 DIAGNOSIS — M25562 Pain in left knee: Secondary | ICD-10-CM

## 2017-01-12 DIAGNOSIS — M7061 Trochanteric bursitis, right hip: Secondary | ICD-10-CM

## 2017-01-12 NOTE — Progress Notes (Signed)
Office Visit Note   Patient: [name]           Date of Birth: 1941/03/20           MRN: [id number] Visit Date: 01/12/2017              Requested by: Shon Baton, MD [place], [place] [address] PCP: Precious Reel, MD   Assessment & Plan: Visit Diagnoses:  1. Trochanteric bursitis, right hip   2. Acute pain of left knee     Plan: We will send her to physical therapy for core strengthening ,back exercises, IT band stretching, modalities and a home exercise program. See her back in 1 month check her progress lack of.  Follow-Up Instructions: Return in about 4 weeks (around 02/09/2017).   Orders:  No orders of the defined types were placed in this encounter.  No orders of the defined types were placed in this encounter.     Procedures: No procedures performed   Clinical Data: No additional findings.   Subjective: No chief complaint on file.   HPI Dates that the right hip injection helped for about a week with her pains return. She has pain whenever she lies on the right side. No numbness tingling down the right leg. She does have some low back pain. In regards to left knee is doing somewhat better but still has some swelling. She started taking Tart cherries. Review of Systems See HPI  Objective: Vital Signs: There were no vitals taken for this visit.  Physical Exam  Ortho Exam Left knee slight effusion no abnormal warmth or erythema excellent range of motion otherwise. Right hip she has good range of motion of the hip tenderness over the right trochanteric region until lesser extent last exam. Specialty Comments:  No specialty comments available.  Imaging: No results found.   PMFS History: Patient Active Problem List   Diagnosis Date Noted  . Thrombocytopenia ([place]) 03/27/2016  . High risk medications (not anticoagulants) long-term use 03/27/2016  . Chronic anticoagulation 03/27/2016  . Atherosclerotic heart disease of native coronary  artery without angina pectoris 03/03/2016  . Bilateral hearing loss 02/16/2016  . Bilateral impacted cerumen 02/16/2016  . Rhinitis, chronic 02/16/2016  . Embolic stroke ([place]) [id number]  . Vaginal atrophy 12/29/2015  . On amiodarone therapy 12/23/2015  . Dyslipidemia, goal LDL below 70   . Stroke with cerebral ischemia ([place])   . Visual disturbance   . Cerebral thrombosis with cerebral infarction 12/08/2015  . NSTEMI (non-ST elevated myocardial infarction) ([place]) 12/04/2015  . 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 Chemotherapy-induced peripheral neuropathy ([place]) 07/23/2015  . Portacath in place 07/23/2015  . Recurrent carcinoma of ovary ([place]) 07/23/2015  . International Federation of Gynecology and Obstetrics (FIGO) 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[place]) 07/23/2015  . Genetic testing 07/14/2015  . Encounter for antineoplastic chemotherapy 06/25/2015  . Port catheter in place 06/25/2015  . Antineoplastic chemotherapy induced pancytopenia ([place]) 06/02/2015  . Dehydration 06/02/2015  . 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 Chemotherapy induced neutropenia ([place]) 05/13/2015  . Chemotherapy induced thrombocytopenia 05/13/2015  . Hyperbilirubinemia 05/06/2015  . Osteoarthritis of left knee 01/03/2015  . Status post total left knee replacement 01/03/2015  . PAF (paroxysmal atrial fibrillation) ([place]): Symptomatic - Rhythm control with Amiodarone. CHA2DS2Vasc Score 7: On Eliquis   . Fever 05/03/2013    Class: Acute  . Nausea 05/03/2013    Class: Acute  . Constipation 05/03/2013  Class: Acute  . Syncope, history of 04/19/2013  . Essential hypertension 04/19/2013  . Hypothyroidism 04/19/2013  . Degenerative arthritis of hip 02/25/2012   Past Medical History:  Diagnosis Date  . Arthritis    OSTEOARTHRITIS   -- CONSTANT PAIN RIGHT HIP---AND PAIN LEFT KNEE--PT STATES SHE GETS INJECTIONS INTO HER KNEE  . Complication of anesthesia      BLOOD PRESSURE DROPPED WITH NASAL SURGERY, ONE OF THE CARPAL TUNNEL REPAIRS AND DURING A COLONOSCOPY  . Dyslipidemia   . History of skin cancer   . Hypertension   . Hypothyroidism   . Menopausal symptoms   . NSTEMI (non-ST elevated myocardial infarction) ([place]) 12/09/15   Medical management: Distal branch of D1 95%, ostial D2 75%, distal LAD 50%. Tortuous arteries consistent with hypertension  . Ovarian cancer ([place]) 01/2013   Recurrence since 2014/2016  . PAF (paroxysmal atrial fibrillation) ([facility])    Only on Plavix -- not full Anticoagulation per pt. request; On Amiodarone (Eye Exam 12/25/15)    Family History  Problem Relation Age of Onset  . Hypertension Mother   . Basal cell carcinoma Mother   . AAA (abdominal aortic aneurysm) Father   . Heart Problems Maternal Uncle   . Heart Problems Maternal Grandfather   . AAA (abdominal aortic aneurysm) Paternal Grandmother   . Prostate cancer Maternal Uncle 70  . Prostate cancer Other   . Other Daughter     one daughter had TAH-BSO at 11; other daughter will have one soon    Past Surgical History:  Procedure Laterality Date  . BILATERAL CARPAL TUNNEL REPAIR  2007  . CARDIAC CATHETERIZATION N/A 12/05/2015   Procedure: Left Heart Cath and Coronary Angiography;  Surgeon: Belva Crome, MD;  Location: [facility];  Service: Cardiovascular;  distal branch of D1 95%, ostial D2 75%, dLAD 50%,p-mRCA 40%. Tortuous vessels.  Marland Kitchen DILATION AND CURETTAGE OF UTERUS  1969  . LAPAROTOMY Bilateral 02/13/2013   Procedure: EXPLORATORY LAPAROTOMY TOTAL ABDOMINAL HYSTERECTOMY BILATERAL SALPINGO-OOPHORECTOMY, Partial Rectal Resection with Reanastamosis;  Surgeon: Alvino Chapel, MD;  Location: [facility];  Service: Gynecology;  Laterality: Bilateral;  . LEFT KNEE ARTHROSCOPY   2011  . LYMPHADENECTOMY Right 02/13/2013   Procedure: PEVLIC  LYMPHADENECTOMY, DEBULKING right pelvic tumor nodules;  Surgeon: Alvino Chapel, MD;  Location: [facility];   Service: Gynecology;  Laterality: Right;  . NM MYOVIEW LTD  June 2010    subbmaximal with no ischemia or infarction.  . OMENTECTOMY  02/13/2013   Procedure: OMENTECTOMY;  Surgeon: Alvino Chapel, MD;  Location: [facility];  Service: Gynecology;;  . [place]  . SURGERY FOR RUPTURED OVARIAN CYST  1969  . TAH/BSO/Tumor debulking with right pelvic LND  01/2013  . TONSILLECTOMY  1962  . TOTAL HIP ARTHROPLASTY  02/25/2012   Procedure: TOTAL HIP ARTHROPLASTY ANTERIOR APPROACH;  Surgeon: Mcarthur Rossetti, MD;  Location: [facility];  Service: Orthopedics;  Laterality: Right;  . TOTAL KNEE ARTHROPLASTY Left 01/03/2015   Procedure: LEFT TOTAL KNEE ARTHROPLASTY;  Surgeon: Mcarthur Rossetti, MD;  [facility];  Service: Orthopedics;  Laterality: Left;  . TRANSTHORACIC ECHOCARDIOGRAM  May 2014; January 2016   a. Normal LV size function. EF 60-65%. Grade 1 diastolic function. Mild MR and mildly elevated PA pressures of 37 mmHg per;; b. EF 60-65%. Mobile echodensity 10 mm x 6 mm attest interventricular septum is questionable fibroblastoma.   Social History   Occupational History  . Not on file.  Social History Main Topics  . Smoking status: Never Smoker  . Smokeless tobacco: Never Used  . Alcohol use No  . Drug use: No  . Sexual activity: Not Currently

## 2017-01-20 ENCOUNTER — Other Ambulatory Visit: Payer: Medicare Other

## 2017-01-24 ENCOUNTER — Ambulatory Visit: Payer: Medicare Other | Attending: Orthopaedic Surgery

## 2017-01-24 DIAGNOSIS — M25651 Stiffness of right hip, not elsewhere classified: Secondary | ICD-10-CM | POA: Diagnosis present

## 2017-01-24 DIAGNOSIS — M25551 Pain in right hip: Secondary | ICD-10-CM

## 2017-01-24 DIAGNOSIS — M25652 Stiffness of left hip, not elsewhere classified: Secondary | ICD-10-CM | POA: Diagnosis present

## 2017-01-24 DIAGNOSIS — M5441 Lumbago with sciatica, right side: Secondary | ICD-10-CM | POA: Insufficient documentation

## 2017-01-24 DIAGNOSIS — G8929 Other chronic pain: Secondary | ICD-10-CM

## 2017-01-24 NOTE — Patient Instructions (Addendum)
Perform all exercises below:  Hold _20___ seconds. Repeat _3___ times.  Do __3__ sessions per day. CAUTION: Movement should be gentle, steady and slow.  Knee to Chest  Lying supine, bend involved knee to chest. Perform with each leg.   Lumbar Rotation: Caudal - Bilateral (Supine)  Feet and knees together, arms outstretched, rotate knees left, turning head in opposite direction, until stretch is felt.      HIP: Hamstrings - Short Sitting   Rest leg on raised surface. Keep knee straight. Lift chest.   [facility] [address], [address] [place], [place] [address] Phone # [phone] Fax [phone] 

## 2017-01-24 NOTE — Therapy (Signed)
[facility] [address] [place], [place], [address] Phone: [phone]   Fax:  [phone]  Physical Therapy Treatment  Patient Details  Name: [name] MRN: [id number] Date of Birth: Apr 14, 1941 Referring Provider: Erskine Emery, [facility]  Encounter Date: 01/24/2017      PT End of Session - 01/24/17 1007    Visit Number 1   Number of Visits 10   Date for PT Re-Evaluation 03/21/17   PT Start Time 0923   PT Stop Time 1005   PT Time Calculation (min) 42 min   Activity Tolerance Patient tolerated treatment well   Behavior During Therapy [facility] for tasks assessed/performed      Past Medical History:  Diagnosis Date  . Arthritis    OSTEOARTHRITIS   -- CONSTANT PAIN RIGHT HIP---AND PAIN LEFT KNEE--PT STATES SHE GETS INJECTIONS INTO HER KNEE  . Complication of anesthesia    BLOOD PRESSURE DROPPED WITH NASAL SURGERY, ONE OF THE CARPAL TUNNEL REPAIRS AND DURING A COLONOSCOPY  . Dyslipidemia   . History of skin cancer   . Hypertension   . Hypothyroidism   . Menopausal symptoms   . NSTEMI (non-ST elevated myocardial infarction) ([place]) 12/09/15   Medical management: Distal branch of D1 95%, ostial D2 75%, distal LAD 50%. Tortuous arteries consistent with hypertension  . Ovarian cancer ([place]) 01/2013   Recurrence since 2014/2016  . PAF (paroxysmal atrial fibrillation) ([place])    Only on Plavix -- not full Anticoagulation per pt. request; On Amiodarone (Eye Exam 12/25/15)    Past Surgical History:  Procedure Laterality Date  . BILATERAL CARPAL TUNNEL REPAIR  2007  . CARDIAC CATHETERIZATION N/A 12/05/2015   Procedure: Left Heart Cath and Coronary Angiography;  Surgeon: Belva Crome, MD;  Location: [facility];  Service: Cardiovascular;  distal branch of D1 95%, ostial D2 75%, dLAD 50%,p-mRCA 40%. Tortuous vessels.  Marland Kitchen DILATION AND CURETTAGE OF UTERUS  1969  . LAPAROTOMY Bilateral 02/13/2013   Procedure: EXPLORATORY  LAPAROTOMY TOTAL ABDOMINAL HYSTERECTOMY BILATERAL SALPINGO-OOPHORECTOMY, Partial Rectal Resection with Reanastamosis;  Surgeon: Alvino Chapel, MD;  [facility];  Service: Gynecology;  Laterality: Bilateral;  . LEFT KNEE ARTHROSCOPY   2011  . LYMPHADENECTOMY Right 02/13/2013   Procedure: PEVLIC  LYMPHADENECTOMY, DEBULKING right pelvic tumor nodules;  Surgeon: Alvino Chapel, MD;  [facility];  Service: Gynecology;  Laterality: Right;  . NM MYOVIEW LTD  June 2010    subbmaximal with no ischemia or infarction.  . OMENTECTOMY  02/13/2013   Procedure: OMENTECTOMY;  Surgeon: Alvino Chapel, MD;  Location: [facility];  Service: Gynecology;;  . [place]  . SURGERY FOR RUPTURED OVARIAN CYST  1969  . TAH/BSO/Tumor debulking with right pelvic LND  01/2013  . TONSILLECTOMY  1962  . TOTAL HIP ARTHROPLASTY  02/25/2012   Procedure: TOTAL HIP ARTHROPLASTY ANTERIOR APPROACH;  Surgeon: Mcarthur Rossetti, MD;  [facility];  Service: Orthopedics;  Laterality: Right;  . TOTAL KNEE ARTHROPLASTY Left 01/03/2015   Procedure: LEFT TOTAL KNEE ARTHROPLASTY;  Surgeon: Mcarthur Rossetti, MD;  [facility];  Service: Orthopedics;  Laterality: Left;  . TRANSTHORACIC ECHOCARDIOGRAM  May 2014; January 2016   a. Normal LV size function. EF 60-65%. Grade 1 diastolic function. Mild MR and mildly elevated PA pressures of 37 mmHg per;; b. EF 60-65%. Mobile echodensity 10 mm x 6 mm attest interventricular septum is questionable fibroblastoma.    There were no  vitals filed for this visit.      Subjective Assessment - 01/24/17 0932    Subjective Pt presents to PT with complaints of Rt hip pain and low back pain of a chronic nature.  Pt has had PT for this in the past and reports that she can't do many of ther exercises as they hurts to much.  Pt reports that she took a medication 01/2016 that gave her bone pain.  She no longer takes this medication and  reports that the pain in her Rt hip has never gone away.     Pertinent History Rt total hip replacement, Lt total knee replacement    Limitations Sitting;Standing   How long can you sit comfortably? 1 hour   How long can you stand comfortably? 1 hour   Diagnostic tests No imaging of lumbar spine.  Rt hip x-ray: prosthesis looks normal.     Patient Stated Goals reduce hip/buttock/low back pain   Currently in Pain? Yes   Pain Score 5    Pain Location Buttocks  Rt low back into buttock   Pain Orientation Right   Pain Descriptors / Indicators Aching;Burning   Pain Type Chronic pain   Pain Onset More than a month ago   Pain Frequency Constant   Aggravating Factors  being in one position too long, laying down at night, getting up and down   Pain Relieving Factors change position, heat   Effect of Pain on Daily Activities not able to stand long            [facility] PT Assessment - 01/24/17 0001      Assessment   Medical Diagnosis Rt hip trochanteric bursisit, LBP   Referring Provider Erskine Emery, PAC   Onset Date/Surgical Date 01/25/16   Next MD Visit 01/2017   Prior Therapy in the past     Precautions   Precautions Other (comment)  cancer- No [place]     Restrictions   Weight Bearing Restrictions No     Balance Screen   Has the patient fallen in the past 6 months No   Has the patient had a decrease in activity level because of a fear of falling?  No   Is the patient reluctant to leave their home because of a fear of falling?  No     Home Environment   Living Environment Private residence   Living Arrangements Spouse/significant other   Type of [place] to enter   Entrance Stairs-Number of Steps [place] One level     Prior Function   Level of [place] Retired   Leisure walk for exercise, yardwork     Cognition   Overall Cognitive Status Within Functional Limits for tasks assessed     Observation/Other  Assessments   Focus on Therapeutic Outcomes (FOTO)  58% limitation     Posture/Postural Control   Posture/Postural Control Postural limitations   Postural Limitations Forward head;Rounded Shoulders;Flexed trunk   Posture Comments Rt iliac crest higher in standing     ROM / Strength   AROM / PROM / Strength AROM;PROM;Strength     AROM   Overall AROM  Deficits   Overall AROM Comments Lumbar A/ROM limited by 50% in all directions- pt reports "pulling" with all lumbar A/ROM, hip flexibility limited by 50% throughout.     PROM   Overall PROM  Deficits   Overall PROM Comments limited by 50% throughout  Strength   Overall Strength Deficits   Strength Assessment Site Knee;Hip   Right/Left Hip Right;Left   Right Hip Flexion 4-/5   Right Hip ABduction 4-/5   Right Hip ADduction 4+/5   Left Hip ABduction 4/5   Right/Left Knee Right;Left   Right Knee Flexion 4-/5   Right Knee Extension 4/5   Left Knee Flexion 4+/5   Left Knee Extension 4/5     Palpation   Palpation comment palpable tenderness at Rt groin and proximal hip flexor.  No tenderness over Rt trochanteric bursa or lumbar spine.       Special Tests    Special Tests Lumbar   Lumbar Tests Slump Test     Slump test   Findings Negative   Side Right     Transfers   Transfers Sit to Stand;Stand to Sit   Sit to Stand With upper extremity assist   Stand to Sit With upper extremity assist     Ambulation/Gait   Ambulation/Gait Yes   Ambulation Distance (Feet) 100 Feet   Gait Pattern Step-through pattern;Decreased stance time - right;Decreased step length - left;Decreased step length - right                             PT Education - 01/24/17 0958    Education provided Yes   Education Details HEP: GENTLE: knee to chest, low trunk rotation, seated hamstring stretch   Person(s) Educated Patient   Methods Explanation;Demonstration;Handout   Comprehension Verbalized understanding;Returned  demonstration          PT Short Term Goals - 01/24/17 1013      PT SHORT TERM GOAL #1   Title be independent in initial HEP   Time 4   Period Weeks   Status New     PT SHORT TERM GOAL #2   Title understand correct body mechanics with daily tasks to decrease strain on the lumbar spine   Time 4   Period Weeks   Status New     PT SHORT TERM GOAL #3   Title report a 20% reduction in lumbar and Rt hip pain with standing   Time 4   Period Weeks   Status New           PT Long Term Goals - 01/24/17 [id number]      PT LONG TERM GOAL #1   Title be independent in advanced HEP   Time 8   Period Weeks   Status New     PT LONG TERM GOAL #2   Title reduce FOTO to < or = to 44% limitation   Time 8   Period Weeks   Status New     PT LONG TERM GOAL #3   Title report a 40% reduction in hip and lumbar pain with standing   Time 8   Period Weeks   Status New     PT LONG TERM GOAL #4   Title demonstate 4 to 4+/5 Rt LE strength to improve ability to perform sit to stand   Time 8   Period Weeks   Status New               Plan - 01/24/17 1008    Clinical Impression Statement Pt presents to PT with complaints of Rt hip and buttock pain of a chronic nature.  Pt reports that she has had pain for many years and has had treatment with poor tolerance for  exercises because they create too much pain.  Pt had injection into Rt trochanteric bursa that helped her pain.  Pt demonstrates limited lumbar and hip A/ROM with pain with all motion.  Pt with Rt iliac crest lower than the Lt in standing and flexed trunk posture.  Pt with Rt>Lt LE weakness.  Pt is a moderate complexity evaluation due to evolving condition, addressing > 3 elements and comorbidities impacting care including 2 joint replacements.  Pt will benefit from skilled PT for gentle flexiblity and LE/core strength exercises to improve mobility and reduce pain.     Rehab Potential Good   PT Frequency 1x / week   PT Duration 8  weeks   PT Treatment/Interventions ADLs/Self Care Home Management;Cryotherapy;Electrical Stimulation;Functional mobility training;Gait training;Moist Heat;Therapeutic activities;Traction;Therapeutic exercise;Neuromuscular re-education;Patient/family education;Passive range of motion;Manual techniques;Dry needling;Taping   PT Next Visit Plan Pt only wants to come 1x/wk for as few sessions as possible.  Assess response to gentle flexibility exercises issued today.  Address LE strength deficits and add flexibility exercises as tolerated.     Consulted and Agree with Plan of Care Patient      Patient will benefit from skilled therapeutic intervention in order to improve the following deficits and impairments:  Postural dysfunction, Decreased strength, Decreased mobility, Impaired flexibility, Improper body mechanics, Pain, Decreased activity tolerance, Decreased endurance, Difficulty walking  Visit Diagnosis: Pain in right hip - Plan: PT plan of care cert/re-cert  Chronic right-sided low back pain with right-sided sciatica - Plan: PT plan of care cert/re-cert  Stiffness of left hip, not elsewhere classified - Plan: PT plan of care cert/re-cert  Stiffness of right hip, not elsewhere classified - Plan: PT plan of care cert/re-cert       G-Codes - 14-Feb-2017 [id number]    Functional Assessment Tool Used (Outpatient Only) FOTO: 58% limitation   Functional Limitation Other PT primary   Other PT Primary Current Status [id number]) At least 40 percent but less than 60 percent impaired, limited or restricted   Other PT Primary Goal Status [id number]) At least 40 percent but less than 60 percent impaired, limited or restricted      Problem List Patient Active Problem List   Diagnosis Date Noted  . Thrombocytopenia ([place]) 03/27/2016  . High risk medications (not anticoagulants) long-term use 03/27/2016  . Chronic anticoagulation 03/27/2016  . Atherosclerotic heart disease of native coronary artery without angina  pectoris 03/03/2016  . Bilateral hearing loss 02/16/2016  . Bilateral impacted cerumen 02/16/2016  . Rhinitis, chronic 02/16/2016  . Embolic stroke ([place]) [id number]  . Vaginal atrophy 12/29/2015  . On amiodarone therapy 12/23/2015  . Dyslipidemia, goal LDL below 70   . Stroke with cerebral ischemia ([place])   . Visual disturbance   . Cerebral thrombosis with cerebral infarction 12/08/2015  . NSTEMI (non-ST elevated myocardial infarction) ([place]) 12/04/2015  . 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 Chemotherapy-induced peripheral neuropathy ([place]) 07/23/2015  . Portacath in place 07/23/2015  . Recurrent carcinoma of ovary ([place]) 07/23/2015  . International Federation of Gynecology and Obstetrics (FIGO) 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[place]) 07/23/2015  . Genetic testing 07/14/2015  . Encounter for antineoplastic chemotherapy 06/25/2015  . Port catheter in place 06/25/2015  . Antineoplastic chemotherapy induced pancytopenia ([place]) 06/02/2015  . Dehydration 06/02/2015  . 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 Chemotherapy induced neutropenia ([place]) 05/13/2015  . Chemotherapy induced thrombocytopenia 05/13/2015  . Hyperbilirubinemia 05/06/2015  . Osteoarthritis of left knee 01/03/2015  .  Status post total left knee replacement 01/03/2015  . PAF (paroxysmal atrial fibrillation) ([place]): Symptomatic - Rhythm control with Amiodarone. CHA2DS2Vasc Score 7: On Eliquis   . Fever 05/03/2013    Class: Acute  . Nausea 05/03/2013    Class: Acute  . Constipation 05/03/2013    Class: Acute  . Syncope, history of 04/19/2013  . Essential hypertension 04/19/2013  . Hypothyroidism 04/19/2013  . Degenerative arthritis of hip 02/25/2012    Sigurd Sos, PT 01/24/17 10:20 AM  [facility] [address] [place], [place], [address] Phone: [phone]   Fax:  [phone]  Name: ANAVI LYDIA MRN: [id number] Date of Birth: [birth year]

## 2017-01-27 ENCOUNTER — Encounter: Payer: Self-pay | Admitting: Nurse Practitioner

## 2017-01-27 ENCOUNTER — Ambulatory Visit (INDEPENDENT_AMBULATORY_CARE_PROVIDER_SITE_OTHER): Payer: Medicare Other | Admitting: Nurse Practitioner

## 2017-01-27 VITALS — BP 125/73 | HR 73 | Ht 63.0 in | Wt 134.8 lb

## 2017-01-27 DIAGNOSIS — E785 Hyperlipidemia, unspecified: Secondary | ICD-10-CM

## 2017-01-27 DIAGNOSIS — I6330 Cerebral infarction due to thrombosis of unspecified cerebral artery: Secondary | ICD-10-CM

## 2017-01-27 DIAGNOSIS — I10 Essential (primary) hypertension: Secondary | ICD-10-CM | POA: Diagnosis not present

## 2017-01-27 DIAGNOSIS — I480 Paroxysmal atrial fibrillation: Secondary | ICD-10-CM | POA: Diagnosis not present

## 2017-01-27 NOTE — Progress Notes (Signed)
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 She has a history of atrial fibrillation. She is currently on eliquis for secondary stroke prevention and atrial fibrillation with no bruising and bleeding. She has had side effects to Lipitor in the past and is currently on Zetia. She is due to get labs next week her primary care. Blood pressure well-controlled in the office today 125/70 she continues to be very active by walking one and a half to 2 miles every day. She continues to eat a healthy diet. She continues to follow-up at [facility] for her history of ovarian cancer. She returns for reevaluation   UPDATE 08/22/2017CM [name], 76 year old female returns for follow-up. She underwent successful cardiac cath , in January but was noted to be lethargic after the procedure.MRI brain that revealed several small areas of acute infarct in the right posterior temporoparietal lobe just above the tentorium.Patient was on aspirin and Plavix prior to readmission and given prior history of atrial fibrillation and likely hypercoagulability from ovarian cancer she was switched to eliquis after discussion with the cardiologist and oncologist. She returns today for follow-up without further stroke or TIA symptoms. She remains on Eliquis without significant bruising or bleeding. She is on Zetia. Blood pressure is well controlled in the office today at 110/68. She continues to walk about 2 miles daily. She returns for reevaluation. Her treatment for her ovarian cancer has concluded for now.  HISTORY 01/26/16 PS74 year Caucasian lady seen today for first office follow-up  visit for hospital admission for stroke in January 2017. [name] is an 76 y.o. female with a past medical history significant for HTN HLD, PAF not taking anticoagulant, ovarian cancer stage IIIB on chemotherapy, initially admitted to [facility] on 1/5 with hypertensive urgency and NSTEMI. She underwent successful cardiac cath 12/05/15 via right radial artery and was noted to be very lethargic after procedure. Family  stated that shortly after cath they noticed that she was not able to see well. Patient was not quite sure if her vision declined afterwards but she said  she had a very peculiar vision change in which she will see her husband " sitting in a chair but also standing and walking from side to side". At the same time, she expressed that she couldn't see almost anything in her left peripheral vision. Stated that these changes lasted approximately 4 hours and then her vision returned to normal. Denies associated HA, vertigo, double vision, focal weakness or numbness, slurred speech, confusion, or language impairment. Her symptoms prompted MRI brain that revealed several small areas of acute infarct in the right posterior temporoparietal lobe just above the tentorium. On plavix and ASA 81 mg daily. Her last known well was uncertain. Patient was not administered TPA secondary to being out of the window. MRI scan of the brain showed small acute infarcts in the right temporoparietal lobe and chronic microhemorrhages in the left parietal lobe and left cerebellum likely due to chronic hypertension. Carotid ultrasound showed no significant extra-axial stenosis. 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 Hemoglobin A1c was 4.9 and LDL cholesterol was elevated at 148. Patient was on aspirin and  Plavix prior to readmission and given prior history of atrial fibrillation and likely hypercoagulability from ovarian cancer she was switched to eliquis after discussion with the cardiologist and oncologist. Patient is tolerating eliquis well without significant bleeding and only minor bruising. She states she's made full recovery and a peripheral vision has improved significantly. She did follow-up with ophthalmologist and was found to have good visual fields and advised to drive. She states her blood pressure is well controlled and today it is 119/72 in office. She plans to start chemotherapy next week and sees her oncologist Dr. Delfin Gant. She has tried Lipitor in the past but had to stop it due to skin itching. She has not tried pravastatin. I discussed with her alternatives including the new PC SK 9 inhibitors but she would prefer to try pravacholl first.   REVIEW OF SYSTEMS: Full 14 system review of systems performed and notable only for those listed, all others are neg:  Constitutional: neg  Cardiovascular: neg Ear/Nose/Throat: neg  Skin: neg Eyes: neg Respiratory: neg Gastroitestinal: Constipation Hematology/Lymphatic: neg  Endocrine: Intolerance to cold Musculoskeletal: Joint pain Allergy/Immunology: neg Neurological: neg Psychiatric: neg Sleep : neg   ALLERGIES: Allergies  Allergen Reactions  . Coreg [Carvedilol] Shortness Of Breath  . Caffeine Other (See Comments)    Makes heart race  . Ceftin [Cefuroxime Axetil]     Interferes with propafenone.   (Note pt is no longer taking propafenone 06/04/16)  . Clonidine Derivatives Other (See Comments)    Pt does not remember what the reaction was  . Codeine Other (See Comments)    Does not like the feeling she gets  . Flexeril [Cyclobenzaprine] Other (See Comments)    Pt states "increased heart rate"  . Levaquin [Levofloxacin In D5w]     Interferes with propafenone.  (note: pt is no longer taking propafenone 06/04/16)  . Lipitor  [Atorvastatin] Dermatitis    "feels like bugs are biting her"   . Lisinopril     LIP NUMBNESS  . Pravastatin Other (See Comments)    FEELS LIKE "BUG BITES" BITING HER LEGS   . Tape Rash    TEGADERM.   (use opsite on PAC)  . Tegaderm Ag Mesh [Silver] Rash and Other (See Comments)    Burns skin  . Ultram [Tramadol] Palpitations    HOME MEDICATIONS: Outpatient Medications Prior to Visit  Medication Sig Dispense Refill  . acetaminophen (TYLENOL) 500 MG tablet Take 1,000 mg by mouth every 6 (six) hours as needed (pain).    Marland Kitchen amiodarone (PACERONE) 200 MG tablet TAKE 1/2 TABLET BY MOUTH DAILY 45 tablet 0  . amLODipine (NORVASC) 5 MG tablet Take 2.5 mg by mouth daily.   3  . bisacodyl (DULCOLAX) 5 MG EC tablet Take 5 mg by mouth daily as needed for moderate constipation. Reported on 12/25/2015    . desonide (DESOWEN) 0.05 % cream Apply 1 application topically 2 (two) times daily.   1  . diphenhydrAMINE (BENADRYL) 25 MG tablet Take 50 mg by mouth at bedtime as needed for sleep.     Marland Kitchen docusate sodium (COLACE) 100 MG capsule Take 200 mg by mouth 2 (two) times daily.     Marland Kitchen ELIQUIS 5 MG TABS tablet TAKE 1 TABLET (5 MG TOTAL) BY MOUTH 2 (TWO) TIMES DAILY. 60 tablet 5  . ezetimibe (ZETIA) 10 MG  tablet Take 1 tablet (10 mg total) by mouth daily. (Patient taking differently: Take 10 mg by mouth at bedtime. ) 30 tablet 6  . Glucosamine HCl 1000 MG TABS Take 1,000 mg by mouth daily.    . hydrocortisone 1 % ointment Apply 1 application topically 2 (two) times daily as needed (eczema).    Marland Kitchen ipratropium (ATROVENT) 0.06 % nasal spray Place 2 sprays into both nostrils daily.   5  . irbesartan (AVAPRO) 300 MG tablet TAKE 1 TABLET (300 MG TOTAL) BY MOUTH DAILY. 90 tablet 2  . levothyroxine (SYNTHROID) 88 MCG tablet Take 88 mcg by mouth daily before breakfast.    . lidocaine-prilocaine (EMLA) cream Apply to Porta-Cath site 1-2 hours prior to access as directed. (Patient taking differently: Apply 1 application  topically See admin instructions. Apply to [facility] site 1-2 hours prior to access as directed.) 30 g 2  . magnesium hydroxide (MILK OF MAGNESIA) 800 MG/5ML suspension Take 30 mLs by mouth daily as needed for constipation. Reported on 11/20/2015    . Melatonin 5 MG CAPS Take 5 mg by mouth at bedtime as needed (sleep). Reported on 12/25/2015    . Menthol, Topical Analgesic, (ICY HOT EX) Apply 1 application topically daily as needed (pain.).    Marland Kitchen Multiple Vitamin (MULTIVITAMIN WITH MINERALS) TABS tablet Take 1 tablet by mouth daily. Centrum Silver    . Polyethyl Glycol-Propyl Glycol (SYSTANE OP) Place 1 drop into both eyes 2 (two) times daily.     . polyethylene glycol (MIRALAX / GLYCOLAX) packet Take 17 g by mouth daily. Mix in 8 oz liquid and drink    . potassium chloride (K-DUR) 10 MEQ tablet Take 10 mEq by mouth every Monday, Wednesday, and Friday.    Marland Kitchen spironolactone (ALDACTONE) 25 MG tablet Take 25 mg by mouth. Taking TTSS     No facility-administered medications prior to visit.     PAST MEDICAL HISTORY: Past Medical History:  Diagnosis Date  . Arthritis    OSTEOARTHRITIS   -- CONSTANT PAIN RIGHT HIP---AND PAIN LEFT KNEE--PT STATES SHE GETS INJECTIONS INTO HER KNEE  . Complication of anesthesia    BLOOD PRESSURE DROPPED WITH NASAL SURGERY, ONE OF THE CARPAL TUNNEL REPAIRS AND DURING A COLONOSCOPY  . Dyslipidemia   . History of skin cancer   . Hypertension   . Hypothyroidism   . Menopausal symptoms   . NSTEMI (non-ST elevated myocardial infarction) ([place]) 12/09/15   Medical management: Distal branch of D1 95%, ostial D2 75%, distal LAD 50%. Tortuous arteries consistent with hypertension  . Ovarian cancer ([place]) 01/2013   Recurrence since 2014/2016  . PAF (paroxysmal atrial fibrillation) ([place])    Only on Plavix -- not full Anticoagulation per pt. request; On Amiodarone (Eye Exam 12/25/15)    PAST SURGICAL HISTORY: Past Surgical History:  Procedure Laterality Date  . BILATERAL CARPAL  TUNNEL REPAIR  2007  . CARDIAC CATHETERIZATION N/A 12/05/2015   Procedure: Left Heart Cath and Coronary Angiography;  Surgeon: Belva Crome, MD;  Location: [facility];  Service: Cardiovascular;  distal branch of D1 95%, ostial D2 75%, dLAD 50%,p-mRCA 40%. Tortuous vessels.  Marland Kitchen DILATION AND CURETTAGE OF UTERUS  1969  . LAPAROTOMY Bilateral 02/13/2013   Procedure: EXPLORATORY LAPAROTOMY TOTAL ABDOMINAL HYSTERECTOMY BILATERAL SALPINGO-OOPHORECTOMY, Partial Rectal Resection with Reanastamosis;  Surgeon: Alvino Chapel, MD;  Location: [facility];  Service: Gynecology;  Laterality: Bilateral;  . LEFT KNEE ARTHROSCOPY   2011  . LYMPHADENECTOMY Right 02/13/2013   Procedure:  PEVLIC  LYMPHADENECTOMY, DEBULKING right pelvic tumor nodules;  Surgeon: Alvino Chapel, MD;  Location: [facility];  Service: Gynecology;  Laterality: Right;  . NM MYOVIEW LTD  June 2010    subbmaximal with no ischemia or infarction.  . OMENTECTOMY  02/13/2013   Procedure: OMENTECTOMY;  Surgeon: Alvino Chapel, MD;  Location: [facility];  Service: Gynecology;;  . [place]  . SURGERY FOR RUPTURED OVARIAN CYST  1969  . TAH/BSO/Tumor debulking with right pelvic LND  01/2013  . TONSILLECTOMY  1962  . TOTAL HIP ARTHROPLASTY  02/25/2012   Procedure: TOTAL HIP ARTHROPLASTY ANTERIOR APPROACH;  Surgeon: Mcarthur Rossetti, MD;  Location: [facility];  Service: Orthopedics;  Laterality: Right;  . TOTAL KNEE ARTHROPLASTY Left 01/03/2015   Procedure: LEFT TOTAL KNEE ARTHROPLASTY;  Surgeon: Mcarthur Rossetti, MD;  Location: [facility];  Service: Orthopedics;  Laterality: Left;  . TRANSTHORACIC ECHOCARDIOGRAM  May 2014; January 2016   a. Normal LV size function. EF 60-65%. Grade 1 diastolic function. Mild MR and mildly elevated PA pressures of 37 mmHg per;; b. EF 60-65%. Mobile echodensity 10 mm x 6 mm attest interventricular septum is questionable fibroblastoma.    FAMILY HISTORY: Family History    Problem Relation Age of Onset  . Hypertension Mother   . Basal cell carcinoma Mother   . AAA (abdominal aortic aneurysm) Father   . Heart Problems Maternal Uncle   . Heart Problems Maternal Grandfather   . AAA (abdominal aortic aneurysm) Paternal Grandmother   . Prostate cancer Maternal Uncle 70  . Prostate cancer Other   . Other Daughter     one daughter had TAH-BSO at 24; other daughter will have one soon    SOCIAL HISTORY: Social History   Social History  . Marital status: Married    Spouse name: N/A  . Number of children: N/A  . Years of education: N/A   Occupational History  . Not on file.   Social History Main Topics  . Smoking status: Never Smoker  . Smokeless tobacco: Never Used  . Alcohol use No  . Drug use: No  . Sexual activity: Not Currently   Other Topics Concern  . Not on file   Social History Narrative   Married mother of 3, grandmother 25.   Exercises 6/7 days a week walking 30 minutes a time.   Never smoked or drank alcohol.     PHYSICAL EXAM  Vitals:   01/27/17 1101  BP: 125/73  Pulse: 73  Weight: 134 lb 12.8 oz (61.1 kg)  Height: 5\' 3"  (1.6 m)   Body mass index is 23.88 kg/m. General:  Caucasian lady, seated, in no evident distress Head: head normocephalic and atraumatic.  Neck: supple with no carotid bruits Cardiovascular: regular rate and rhythm, no murmurs Musculoskeletal: no deformity Skin:  no rash/petichiae Neurological examination  Mental Status: Awake and fully alert. Oriented to place and time. Recent and remote memory intact. Attention span, concentration and fund of knowledge appropriate. Mood and affect appropriate.  Cranial Nerves: Pupils equal, briskly reactive to light. Extraocular movements full without nystagmus. Visual fields  Full  Hearing intact. Facial sensation intact. Face, tongue, palate moves normally and symmetrically.  Motor: Normal bulk and tone. Normal strength in all tested extremity muscles. Sensory.:  intact to touch ,pinprick .position and vibratory sensation in the upper and lower extremities.  Coordination: Rapid alternating movements normal in all extremities. Finger-to-nose and heel-to-shin performed accurately bilaterally. Gait and Station: [employer]  from chair without difficulty. Stance is normal. Gait demonstrates normal stride length and balance . Able to heel, toe and tandem walk without difficulty.  Reflexes: 1+ and symmetric. Toes downgoing.   DIAGNOSTIC DATA (LABS, IMAGING, TESTING) - I reviewed patient records, labs, notes, testing and imaging myself where available.  Lab Results  Component Value Date   WBC 4.3 12/20/2016   HGB 11.6 12/20/2016   HCT 33.6 (L) 12/20/2016   MCV 100.5 12/20/2016   PLT 140 (L) 12/20/2016      Component Value Date/Time   NA 138 11/24/2016 1104   K 4.0 11/24/2016 1104   CL 103 06/04/2016 0620   CL 103 05/21/2013 1105   CO2 27 11/24/2016 1104   GLUCOSE 103 11/24/2016 1104   GLUCOSE 74 05/21/2013 1105   BUN 12.5 11/24/2016 1104   CREATININE 0.8 11/24/2016 1104   CALCIUM 9.4 11/24/2016 1104   PROT 6.5 11/24/2016 1104   ALBUMIN 3.9 11/24/2016 1104   AST 18 11/24/2016 1104   ALT 15 11/24/2016 1104   ALKPHOS 104 11/24/2016 1104   BILITOT 0.57 11/24/2016 1104   GFRNONAA >60 06/04/2016 0620   GFRAA >60 06/04/2016 0620   Lab Results  Component Value Date   CHOL 232 (H) 12/04/2015   HDL 70 12/04/2015   LDLCALC 148 (H) 12/04/2015   TRIG 68 12/04/2015   CHOLHDL 3.3 12/04/2015   Lab Results  Component Value Date   HGBA1C 4.9 12/08/2015   Lab Results  Component Value Date   VITAMINB12 1,601 (H) 12/08/2015   Lab Results  Component Value Date   TSH 5.427 (H) 12/09/2015      ASSESSMENT AND PLAN 76 year old Caucasian lady with small right temporal MCA are branch embolic infarcts following cardiac catheterization procedure but with vascular risk factors of atrial fibrillation, hypertension, hyperlipidemia, hypercoagulability from  ovarian cancer.   PLAN: Continue to manage stroke risk factors Continue Eliquis  for secondary stroke prevention given h/o atrial fibrillation  maintain strict control of hypertension with blood pressure goal below 130/90, today's reading 125/73 Lipids with LDL cholesterol goal below 70 mg/dL Continue Zetia Continue follow-up with primary care for labs Continue healthy diet with plenty of whole grains, cereals, fruits and vegetables,  Continue to exercise regularly and maintain ideal body weight  Discharge from neurology clinic REMEMBER Pneumonic FAST which stands for  F stands  for face drooping and weakness etc. A stands for arms, weakness S stands for speech slurred  T stands for time to call 911 I spent 20 minutes in total face to face time with the patient more than 50% of which was spent counseling and coordination of care, reviewing test results reviewing medications and discussing and reviewing the diagnosis of stroke and the importance of risk factor management , Dennie Bible, [facility], [facility], APRN  [facility] [address], [place], [place] [address] [phone]

## 2017-01-27 NOTE — Patient Instructions (Addendum)
Continue to manage stroke risk factors Continue Eliquis  for secondary stroke prevention given h/o atrial fibrillation  maintain strict control of hypertension with blood pressure goal below 130/90, today's reading 125/73 continue antihypertensives Lipids with LDL cholesterol goal below 70 mg/dL Continue Zetia healthy diet with plenty of whole grains, cereals, fruits and vegetables,  exercise regularly and maintain ideal body weight  Discharge from neurology clinic REMEMBER Pneumonic FAST which stands for  F stands  for face drooping and weakness etc. A stands for arms, weakness S stands for speech slurred  T stands for time to call 911

## 2017-01-28 NOTE — Progress Notes (Signed)
I agree with the above plan 

## 2017-01-31 ENCOUNTER — Encounter: Payer: Medicare Other | Admitting: Physical Therapy

## 2017-02-04 ENCOUNTER — Ambulatory Visit: Payer: Medicare Other | Attending: Orthopaedic Surgery | Admitting: Physical Therapy

## 2017-02-04 ENCOUNTER — Encounter: Payer: Self-pay | Admitting: Physical Therapy

## 2017-02-04 DIAGNOSIS — M25551 Pain in right hip: Secondary | ICD-10-CM | POA: Diagnosis not present

## 2017-02-04 DIAGNOSIS — M25651 Stiffness of right hip, not elsewhere classified: Secondary | ICD-10-CM | POA: Diagnosis present

## 2017-02-04 DIAGNOSIS — M5441 Lumbago with sciatica, right side: Secondary | ICD-10-CM | POA: Insufficient documentation

## 2017-02-04 DIAGNOSIS — G8929 Other chronic pain: Secondary | ICD-10-CM | POA: Diagnosis present

## 2017-02-04 DIAGNOSIS — M25652 Stiffness of left hip, not elsewhere classified: Secondary | ICD-10-CM | POA: Diagnosis present

## 2017-02-04 NOTE — Therapy (Addendum)
[facility] [address] [place], [place], [address] Phone: [phone]   Fax:  [phone]  Physical Therapy Treatment  Patient Details  Name: [name] MRN: [id number] Date of Birth: 05-24-1941 Referring Provider: Erskine Emery, [facility]  Encounter Date: 02/04/2017      PT End of Session - 02/04/17 1142    Visit Number 2   Number of Visits 10   Date for PT Re-Evaluation 03/21/17   PT Start Time 9767   PT Stop Time 1100   PT Time Calculation (min) 45 min   Activity Tolerance Patient tolerated treatment well   Behavior During Therapy [facility] for tasks assessed/performed      Past Medical History:  Diagnosis Date  . Arthritis    OSTEOARTHRITIS   -- CONSTANT PAIN RIGHT HIP---AND PAIN LEFT KNEE--PT STATES SHE GETS INJECTIONS INTO HER KNEE  . Complication of anesthesia    BLOOD PRESSURE DROPPED WITH NASAL SURGERY, ONE OF THE CARPAL TUNNEL REPAIRS AND DURING A COLONOSCOPY  . Dyslipidemia   . History of skin cancer   . Hypertension   . Hypothyroidism   . Menopausal symptoms   . NSTEMI (non-ST elevated myocardial infarction) ([place]) 12/09/15   Medical management: Distal branch of D1 95%, ostial D2 75%, distal LAD 50%. Tortuous arteries consistent with hypertension  . Ovarian cancer ([place]) 01/2013   Recurrence since 2014/2016  . PAF (paroxysmal atrial fibrillation) ([place])    Only on Plavix -- not full Anticoagulation per pt. request; On Amiodarone (Eye Exam 12/25/15)    Past Surgical History:  Procedure Laterality Date  . BILATERAL CARPAL TUNNEL REPAIR  2007  . CARDIAC CATHETERIZATION N/A 12/05/2015   Procedure: Left Heart Cath and Coronary Angiography;  Surgeon: Belva Crome, MD;  Location: [place] [facility];  Service: Cardiovascular;  distal branch of D1 95%, ostial D2 75%, dLAD 50%,p-mRCA 40%. Tortuous vessels.  Marland Kitchen DILATION AND CURETTAGE OF UTERUS  1969  . LAPAROTOMY Bilateral 02/13/2013   Procedure: EXPLORATORY  LAPAROTOMY TOTAL ABDOMINAL HYSTERECTOMY BILATERAL SALPINGO-OOPHORECTOMY, Partial Rectal Resection with Reanastamosis;  Surgeon: Alvino Chapel, MD;  Location: [facility];  Service: Gynecology;  Laterality: Bilateral;  . LEFT KNEE ARTHROSCOPY   2011  . LYMPHADENECTOMY Right 02/13/2013   Procedure: PEVLIC  LYMPHADENECTOMY, DEBULKING right pelvic tumor nodules;  Surgeon: Alvino Chapel, MD;  Location: [facility];  Service: Gynecology;  Laterality: Right;  . NM MYOVIEW LTD  June 2010    subbmaximal with no ischemia or infarction.  . OMENTECTOMY  02/13/2013   Procedure: OMENTECTOMY;  Surgeon: Alvino Chapel, MD;  Location: [facility];  Service: Gynecology;;  . [place]  . SURGERY FOR RUPTURED OVARIAN CYST  1969  . TAH/BSO/Tumor debulking with right pelvic LND  01/2013  . TONSILLECTOMY  1962  . TOTAL HIP ARTHROPLASTY  02/25/2012   Procedure: TOTAL HIP ARTHROPLASTY ANTERIOR APPROACH;  Surgeon: Mcarthur Rossetti, MD;  Location: [facility];  Service: Orthopedics;  Laterality: Right;  . TOTAL KNEE ARTHROPLASTY Left 01/03/2015   Procedure: LEFT TOTAL KNEE ARTHROPLASTY;  Surgeon: Mcarthur Rossetti, MD;  Location: [facility];  Service: Orthopedics;  Laterality: Left;  . TRANSTHORACIC ECHOCARDIOGRAM  May 2014; January 2016   a. Normal LV size function. EF 60-65%. Grade 1 diastolic function. Mild MR and mildly elevated PA pressures of 37 mmHg per;; b. EF 60-65%. Mobile echodensity 10 mm x 6 mm attest interventricular septum is questionable fibroblastoma.    There were no  vitals filed for this visit.      Subjective Assessment - 02/04/17 1022    Subjective Pt reports hip aching and feeling pulling in back with certain movements and exercises. Did well with home exercises from last session.    Pertinent History Rt total hip replacement, Lt total knee replacement    Limitations Sitting;Standing   How long can you sit comfortably? 1 hour   How long can you stand  comfortably? 1 hour   Diagnostic tests No imaging of lumbar spine.  Rt hip x-ray: prosthesis looks normal.     Patient Stated Goals reduce hip/buttock/low back pain   Currently in Pain? Yes   Pain Score 5    Pain Location Buttocks   Pain Orientation Right   Pain Descriptors / Indicators Aching;Burning   Pain Type Chronic pain   Pain Onset More than a month ago   Pain Frequency Constant                         [facility] Adult PT Treatment/Exercise - 02/04/17 0001      Exercises   Exercises Knee/Hip     Knee/Hip Exercises: Aerobic   Stationary Bike L1x 3 minutes  painful     Knee/Hip Exercises: Standing   Rebounder Weight shifting 3 directions  1 minute each     Knee/Hip Exercises: Supine   Heel Slides Strengthening;Both;1 set;15 reps   Bridges Right;1 set;10 reps   Bridges with Clamshell Strengthening;Both;1 set;10 reps   Other Supine Knee/Hip Exercises Bent knee raises x10     Modalities   Modalities Electrical Stimulation;Moist Heat     Moist Heat Therapy   Number Minutes Moist Heat 15 Minutes   Moist Heat Location Lumbar Spine     Electrical Stimulation   Electrical Stimulation Location Bil lumbar paraspinals  Pt seated in arm chair   Electrical Stimulation Action IFC   Electrical Stimulation Parameters 8 ma   Electrical Stimulation Goals Pain                  PT Short Term Goals - 02/04/17 1025      PT SHORT TERM GOAL #1   Title be independent in initial HEP   Time 4   Period Weeks   Status On-going     PT SHORT TERM GOAL #2   Title understand correct body mechanics with daily tasks to decrease strain on the lumbar spine   Time 4   Period Weeks   Status On-going     PT SHORT TERM GOAL #3   Title report a 20% reduction in lumbar and Rt hip pain with standing   Time 4   Period Weeks   Status On-going           PT Long Term Goals - 02/04/17 1026      PT LONG TERM GOAL #1   Title be independent in advanced HEP   Time 8    Period Weeks   Status On-going     PT LONG TERM GOAL #2   Title reduce FOTO to < or = to 44% limitation   Time 8   Period Weeks   Status On-going     PT LONG TERM GOAL #3   Title report a 40% reduction in hip and lumbar pain with standing   Time 8   Period Weeks   Status On-going     PT LONG TERM GOAL #4   Title demonstate 4 to  4+/5 Rt LE strength to improve ability to perform sit to stand   Time 8   Period Weeks   Status On-going               Plan - 02/04/17 1139    Clinical Impression Statement Pt presents with leaning posture and antalgic gait. Pt had difficulty with all supine exercises only able to tolerate a few reps due to increased back pain. Pt able to tolerate standing weight shifting well with no increased pain. Attmpted stationary bike but also increased pain. Will try more standing exercises and Nustep at next visit. Pt will continue to benefit from skilled therapy for core stability and LE strength.    Rehab Potential Good   PT Frequency 1x / week   PT Duration 8 weeks   PT Next Visit Plan Try easy standing exercises and Nustep   Consulted and Agree with Plan of Care Patient      Patient will benefit from skilled therapeutic intervention in order to improve the following deficits and impairments:  Postural dysfunction, Decreased strength, Decreased mobility, Impaired flexibility, Improper body mechanics, Pain, Decreased activity tolerance, Decreased endurance, Difficulty walking  Visit Diagnosis: Pain in right hip  Chronic right-sided low back pain with right-sided sciatica  Stiffness of left hip, not elsewhere classified  Stiffness of right hip, not elsewhere classified     Problem List Patient Active Problem List   Diagnosis Date Noted  . Thrombocytopenia ([place]) 03/27/2016  . High risk medications (not anticoagulants) long-term use 03/27/2016  . Chronic anticoagulation 03/27/2016  . Atherosclerotic heart disease of native coronary artery  without angina pectoris 03/03/2016  . Bilateral hearing loss 02/16/2016  . Bilateral impacted cerumen 02/16/2016  . Rhinitis, chronic 02/16/2016  . Embolic stroke ([place]) 59/29/2446  . Vaginal atrophy 12/29/2015  . On amiodarone therapy 12/23/2015  . Dyslipidemia, goal LDL below 70   . Stroke with cerebral ischemia ([place])   . Visual disturbance   . Cerebral thrombosis with cerebral infarction 12/08/2015  . NSTEMI (non-ST elevated myocardial infarction) ([place]) 12/04/2015  . 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 Chemotherapy-induced peripheral neuropathy ([place]) 07/23/2015  . Portacath in place 07/23/2015  . Recurrent carcinoma of ovary ([place]) 07/23/2015  . International Federation of Gynecology and Obstetrics (FIGO) 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[place]) 07/23/2015  . Genetic testing 07/14/2015  . Encounter for antineoplastic chemotherapy 06/25/2015  . Port catheter in place 06/25/2015  . Antineoplastic chemotherapy induced pancytopenia ([place]) 06/02/2015  . Dehydration 06/02/2015  . 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 Chemotherapy induced neutropenia ([place]) 05/13/2015  . Chemotherapy induced thrombocytopenia 05/13/2015  . Hyperbilirubinemia 05/06/2015  . Osteoarthritis of left knee 01/03/2015  . Status post total left knee replacement 01/03/2015  . PAF (paroxysmal atrial fibrillation) ([place]): Symptomatic - Rhythm control with Amiodarone. CHA2DS2Vasc Score 7: On Eliquis   . Fever 05/03/2013    Class: Acute  . Nausea 05/03/2013    Class: Acute  . Constipation 05/03/2013    Class: Acute  . Syncope, history of 04/19/2013  . Essential hypertension 04/19/2013  . Hypothyroidism 04/19/2013  . Degenerative arthritis of hip 02/25/2012    Mikle Bosworth PTA 02/04/2017, 11:44 AM PHYSICAL THERAPY DISCHARGE SUMMARY G-codes: Other PT primary CK goal status CK D/C status  Visits from Start of Care: 2  Current functional level related  to goals / functional outcomes: Pt attended 2 PT sessions and didn't return for further sessions.  Remaining deficits: See above for most current status.     Education / Equipment: HEP Plan: Patient agrees to discharge.  Patient goals were not met. Patient is being discharged due to not returning since the last visit.  ?????        Sigurd Sos, PT 03/08/17 11:27 AM   [facility] [address], [place], [place], [address] Phone: [phone]   Fax:  [phone]  Name: WYLENE WEISSMAN MRN: [id number] Date of Birth: December 11, 1940

## 2017-02-14 ENCOUNTER — Ambulatory Visit (HOSPITAL_BASED_OUTPATIENT_CLINIC_OR_DEPARTMENT_OTHER): Payer: Medicare Other

## 2017-02-14 DIAGNOSIS — C7989 Secondary malignant neoplasm of other specified sites: Secondary | ICD-10-CM

## 2017-02-14 DIAGNOSIS — C561 Malignant neoplasm of right ovary: Secondary | ICD-10-CM | POA: Diagnosis not present

## 2017-02-14 DIAGNOSIS — Z95828 Presence of other vascular implants and grafts: Secondary | ICD-10-CM

## 2017-02-14 DIAGNOSIS — Z452 Encounter for adjustment and management of vascular access device: Secondary | ICD-10-CM

## 2017-02-14 MED ORDER — SODIUM CHLORIDE 0.9% FLUSH
10.0000 mL | INTRAVENOUS | Status: DC | PRN
  Administered 2017-02-14: 10 mL via INTRAVENOUS
  Filled 2017-02-14: qty 10

## 2017-02-14 MED ORDER — HEPARIN SOD (PORK) LOCK FLUSH 100 UNIT/ML IV SOLN
500.0000 [IU] | Freq: Once | INTRAVENOUS | Status: AC
  Administered 2017-02-14: 500 [IU] via INTRAVENOUS
  Filled 2017-02-14: qty 5

## 2017-02-14 NOTE — Patient Instructions (Signed)
Implanted Port Home Guide An implanted port is a type of central line that is placed under the skin. Central lines are used to provide IV access when treatment or nutrition needs to be given through a person's veins. Implanted ports are used for long-term IV access. An implanted port may be placed because:  You need IV medicine that would be irritating to the small veins in your hands or arms.  You need long-term IV medicines, such as antibiotics.  You need IV nutrition for a long period.  You need frequent blood draws for lab tests.  You need dialysis.  Implanted ports are usually placed in the chest area, but they can also be placed in the upper arm, the abdomen, or the leg. An implanted port has two main parts:  Reservoir. The reservoir is round and will appear as a small, raised area under your skin. The reservoir is the part where a needle is inserted to give medicines or draw blood.  Catheter. The catheter is a thin, flexible tube that extends from the reservoir. The catheter is placed into a large vein. Medicine that is inserted into the reservoir goes into the catheter and then into the vein.  How will I care for my incision site? Do not get the incision site wet. Bathe or shower as directed by your health care provider. How is my port accessed? Special steps must be taken to access the port:  Before the port is accessed, a numbing cream can be placed on the skin. This helps numb the skin over the port site.  Your health care provider uses a sterile technique to access the port. ? Your health care provider must put on a mask and sterile gloves. ? The skin over your port is cleaned carefully with an antiseptic and allowed to dry. ? The port is gently pinched between sterile gloves, and a needle is inserted into the port.  Only "non-coring" port needles should be used to access the port. Once the port is accessed, a blood return should be checked. This helps ensure that the port  is in the vein and is not clogged.  If your port needs to remain accessed for a constant infusion, a clear (transparent) bandage will be placed over the needle site. The bandage and needle will need to be changed every week, or as directed by your health care provider.  Keep the bandage covering the needle clean and dry. Do not get it wet. Follow your health care provider's instructions on how to take a shower or bath while the port is accessed.  If your port does not need to stay accessed, no bandage is needed over the port.  What is flushing? Flushing helps keep the port from getting clogged. Follow your health care provider's instructions on how and when to flush the port. Ports are usually flushed with saline solution or a medicine called heparin. The need for flushing will depend on how the port is used.  If the port is used for intermittent medicines or blood draws, the port will need to be flushed: ? After medicines have been given. ? After blood has been drawn. ? As part of routine maintenance.  If a constant infusion is running, the port may not need to be flushed.  How long will my port stay implanted? The port can stay in for as long as your health care provider thinks it is needed. When it is time for the port to come out, surgery will be   done to remove it. The procedure is similar to the one performed when the port was put in. When should I seek immediate medical care? When you have an implanted port, you should seek immediate medical care if:  You notice a bad smell coming from the incision site.  You have swelling, redness, or drainage at the incision site.  You have more swelling or pain at the port site or the surrounding area.  You have a fever that is not controlled with medicine.  This information is not intended to replace advice given to you by your health care provider. Make sure you discuss any questions you have with your health care provider. Document  Released: 11/15/2005 Document Revised: 04/22/2016 Document Reviewed: 07/23/2013 Elsevier Interactive Patient Education  2017 Elsevier Inc.  

## 2017-02-15 ENCOUNTER — Ambulatory Visit (INDEPENDENT_AMBULATORY_CARE_PROVIDER_SITE_OTHER): Payer: Medicare Other | Admitting: Orthopaedic Surgery

## 2017-02-15 DIAGNOSIS — Z96652 Presence of left artificial knee joint: Secondary | ICD-10-CM

## 2017-02-15 NOTE — Progress Notes (Signed)
The patient is well-known to me. She has a history of a left total knee replacement that we've replaced in February 2016. She has done well for long period time but his had some recurrent effusions of that knee. She is on the blood thinner Elaquis. I've aspirated this knee before encounter a serous sanguinous effusion.  On examination today of her left knee she has full range of motion of the knee. There is no evidence of infection or redness. I do feel that there is slight play in the knee in terms of a positive drawer sign. There is an obvious effusion as well. X-rays in the recent past of her left needed.show any evidence of prosthetic loosening.  A long discussion I'm certainly concerned about effusion of her knee. I do feel that she needs a revision surgery of this knee which would hopefully be a synovectomy and a polyliner exchange rather than exchanging the components themselves. I do feel that going to a thicker polyliner would help for some stability in decreased swelling in her knee. I talked her in length and detail about this. A thorough discussion of risk and benefits of surgery side as well. She would need to be off of her blood thinner for about 2 days preoperative. I gave her our surgery scheduler's card and she is in a soccer husband this and think about it. She sees her cardiologist this week as well. I told her to talk to him about this as well. If she decides to have the surgery she will give [place] a call.

## 2017-02-17 ENCOUNTER — Encounter: Payer: Self-pay | Admitting: Cardiology

## 2017-02-17 ENCOUNTER — Ambulatory Visit (INDEPENDENT_AMBULATORY_CARE_PROVIDER_SITE_OTHER): Payer: Medicare Other | Admitting: Cardiology

## 2017-02-17 VITALS — BP 135/80 | HR 88 | Ht 63.0 in | Wt 135.6 lb

## 2017-02-17 DIAGNOSIS — I214 Non-ST elevation (NSTEMI) myocardial infarction: Secondary | ICD-10-CM | POA: Diagnosis not present

## 2017-02-17 DIAGNOSIS — I639 Cerebral infarction, unspecified: Secondary | ICD-10-CM | POA: Diagnosis not present

## 2017-02-17 DIAGNOSIS — Z79899 Other long term (current) drug therapy: Secondary | ICD-10-CM

## 2017-02-17 DIAGNOSIS — I480 Paroxysmal atrial fibrillation: Secondary | ICD-10-CM

## 2017-02-17 DIAGNOSIS — I2510 Atherosclerotic heart disease of native coronary artery without angina pectoris: Secondary | ICD-10-CM | POA: Diagnosis not present

## 2017-02-17 DIAGNOSIS — E785 Hyperlipidemia, unspecified: Secondary | ICD-10-CM | POA: Diagnosis not present

## 2017-02-17 DIAGNOSIS — I10 Essential (primary) hypertension: Secondary | ICD-10-CM | POA: Diagnosis not present

## 2017-02-17 NOTE — Patient Instructions (Signed)
Medication Instructions: STOP Amlodipine   Follow-Up: Your physician recommends that you schedule a follow-up appointment with PharmD for starting Repatha.  Your physician wants you to follow-up in: 6 months with Dr. Ellyn Hack. You will receive a reminder letter in the mail two months in advance. If you don't receive a letter, please call our office to schedule the follow-up appointment.  If you need a refill on your cardiac medications before your next appointment, please call your pharmacy.

## 2017-02-17 NOTE — Progress Notes (Signed)
PCP: Precious Reel, MD  Clinic Note: Chief Complaint  Patient presents with  . Follow-up    6 months; Pt states no Sx.   . Atrial Fibrillation  . Coronary Artery Disease    Small branch vessel    HPI: [name] is a 76 y.o. female with a PMH below who presents today for 3 month follow-up for CAD, A. fib.She had a cardiac catheterization done in the setting of positive troponins from A. fib RVR. She was noted to have small vessel branch disease without PCI targets. She was therefore taken off Rythmol start on amiodarone.  [name] was last seen in Sept 2017 - Partially for preoperative evaluation for her knee surgery. Her most recent episode of A. fib was back in January 2017.  Recent Hospitalizations: None since last visit  Studies Reviewed:  n/a  Interval History: [name] presents today really pretty much doing well from a cardiac standpoint. She really is doing quite well. She's not had any symptoms to suggest recurrence of A. fib. One thing she has noticed since we started her on amlodipine is that she has been having worsening symptoms of her hair falling out. Otherwise she's not had any of the significant edema, PND or orthopnea. No chest tightness pressure with rest or exertion. No rapid irregular heartbeat/palpitations. No sick recess near syncope or TIAs amaurosis fugax.  [name] is quite happy to say that by most recent scan she was found to be cancer free.  No claudication.  ROS: A comprehensive was performed. Review of Systems  Constitutional: Negative for malaise/fatigue.  HENT: Negative for congestion.   Respiratory: Negative for cough, shortness of breath and wheezing.   Cardiovascular: Negative for leg swelling (Right ankle only, not foot or leg swelling.).       Per history of present illness  Gastrointestinal: Negative for blood in stool and melena.  Genitourinary: Negative for hematuria.  Musculoskeletal: Positive for joint pain (- Recovering from  knee surgery.).  Neurological: Positive for dizziness (Occasionally with change in position. Nothing significant.). Negative for speech change, focal weakness, seizures, loss of consciousness and headaches.  Psychiatric/Behavioral: Negative for depression and memory loss. The patient is not nervous/anxious and does not have insomnia.   All other systems reviewed and are negative.   Past Medical History:  Diagnosis Date  . Arthritis    OSTEOARTHRITIS   -- CONSTANT PAIN RIGHT HIP---AND PAIN LEFT KNEE--PT STATES SHE GETS INJECTIONS INTO HER KNEE  . Complication of anesthesia    BLOOD PRESSURE DROPPED WITH NASAL SURGERY, ONE OF THE CARPAL TUNNEL REPAIRS AND DURING A COLONOSCOPY  . Dyslipidemia   . History of skin cancer   . Hypertension   . Hypothyroidism   . Menopausal symptoms   . NSTEMI (non-ST elevated myocardial infarction) ([place]) 12/09/15   Medical management: Distal branch of D1 95%, ostial D2 75%, distal LAD 50%. Tortuous arteries consistent with hypertension  . Ovarian cancer ([place]) 01/2013   Recurrence since 2014/2016  . PAF (paroxysmal atrial fibrillation) ([place])    On ELIQUIS; On Amiodarone (Eye Exam 12/25/15) - no longer on Rythmol    Past Surgical History:  Procedure Laterality Date  . BILATERAL CARPAL TUNNEL REPAIR  2007  . CARDIAC CATHETERIZATION N/A 12/05/2015   Procedure: Left Heart Cath and Coronary Angiography;  Surgeon: Belva Crome, MD;  Location: [facility];  Service: Cardiovascular;  distal branch of D1 95%, ostial D2 75%, dLAD 50%,p-mRCA 40%. Tortuous vessels.  Marland Kitchen DILATION AND  CURETTAGE OF UTERUS  1969  . LAPAROTOMY Bilateral 02/13/2013   Procedure: EXPLORATORY LAPAROTOMY TOTAL ABDOMINAL HYSTERECTOMY BILATERAL SALPINGO-OOPHORECTOMY, Partial Rectal Resection with Reanastamosis;  Surgeon: Alvino Chapel, MD;  Location: [facility];  Service: Gynecology;  Laterality: Bilateral;  . LEFT KNEE ARTHROSCOPY   2011  . LYMPHADENECTOMY Right 02/13/2013   Procedure:  PEVLIC  LYMPHADENECTOMY, DEBULKING right pelvic tumor nodules;  Surgeon: Alvino Chapel, MD;  Location: [facility];  Service: Gynecology;  Laterality: Right;  . NM MYOVIEW LTD  June 2010    subbmaximal with no ischemia or infarction.  . OMENTECTOMY  02/13/2013   Procedure: OMENTECTOMY;  Surgeon: Alvino Chapel, MD;  Location: [facility];  Service: Gynecology;;  . [place]  . SURGERY FOR RUPTURED OVARIAN CYST  1969  . TAH/BSO/Tumor debulking with right pelvic LND  01/2013  . TONSILLECTOMY  1962  . TOTAL HIP ARTHROPLASTY  02/25/2012   Procedure: TOTAL HIP ARTHROPLASTY ANTERIOR APPROACH;  Surgeon: Mcarthur Rossetti, MD;  Location: [facility];  Service: Orthopedics;  Laterality: Right;  . TOTAL KNEE ARTHROPLASTY Left 01/03/2015   Procedure: LEFT TOTAL KNEE ARTHROPLASTY;  Surgeon: Mcarthur Rossetti, MD;  Location: [facility];  Service: Orthopedics;  Laterality: Left;  . TRANSTHORACIC ECHOCARDIOGRAM  May 2014; January 2016   a. Normal LV size function. EF 60-65%. Grade 1 diastolic function. Mild MR and mildly elevated PA pressures of 37 mmHg per;; b. EF 60-65%. Mobile echodensity 10 mm x 6 mm attest interventricular septum is questionable fibroblastoma.   Current Meds  Medication Sig  . acetaminophen (TYLENOL) 500 MG tablet Take 1,000 mg by mouth every 6 (six) hours as needed (pain).  Marland Kitchen amiodarone (PACERONE) 200 MG tablet TAKE 1/2 TABLET BY MOUTH DAILY  . Biotin 5000 MCG SUBL Place under the tongue.  . bisacodyl (DULCOLAX) 5 MG EC tablet Take 5 mg by mouth daily as needed for moderate constipation. Reported on 12/25/2015  . desonide (DESOWEN) 0.05 % cream Apply 1 application topically 2 (two) times daily.   . diphenhydrAMINE (BENADRYL) 25 MG tablet Take 50 mg by mouth at bedtime as needed for sleep.   Marland Kitchen docusate sodium (COLACE) 100 MG capsule Take 200 mg by mouth 2 (two) times daily.   Marland Kitchen ELIQUIS 5 MG TABS tablet TAKE 1 TABLET (5 MG TOTAL) BY MOUTH 2 (TWO) TIMES  DAILY.  Marland Kitchen ezetimibe (ZETIA) 10 MG tablet Take 1 tablet (10 mg total) by mouth daily. (Patient taking differently: Take 10 mg by mouth at bedtime. )  . Glucosamine HCl 1000 MG TABS Take 1,000 mg by mouth daily.  . hydrocortisone 1 % ointment Apply 1 application topically 2 (two) times daily as needed (eczema).  Marland Kitchen ipratropium (ATROVENT) 0.06 % nasal spray Place 2 sprays into both nostrils daily.   . irbesartan (AVAPRO) 300 MG tablet TAKE 1 TABLET (300 MG TOTAL) BY MOUTH DAILY.  Marland Kitchen levothyroxine (SYNTHROID) 88 MCG tablet Take 88 mcg by mouth daily before breakfast.  . lidocaine-prilocaine (EMLA) cream Apply to Porta-Cath site 1-2 hours prior to access as directed. (Patient taking differently: Apply 1 application topically See admin instructions. Apply to [facility] site 1-2 hours prior to access as directed.)  . magnesium hydroxide (MILK OF MAGNESIA) 800 MG/5ML suspension Take 30 mLs by mouth daily as needed for constipation. Reported on 11/20/2015  . Melatonin 5 MG CAPS Take 5 mg by mouth at bedtime as needed (sleep). Reported on 12/25/2015  . Menthol, Topical Analgesic, (ICY HOT EX) Apply 1  application topically daily as needed (pain.).  Marland Kitchen Misc Natural Products (TART CHERRY ADVANCED) CAPS Take by mouth.  . Multiple Vitamin (MULTIVITAMIN WITH MINERALS) TABS tablet Take 1 tablet by mouth daily. Centrum Silver  . Polyethyl Glycol-Propyl Glycol (SYSTANE OP) Place 1 drop into both eyes 2 (two) times daily.   . polyethylene glycol (MIRALAX / GLYCOLAX) packet Take 17 g by mouth daily. Mix in 8 oz liquid and drink  . potassium chloride (K-DUR) 10 MEQ tablet Take 10 mEq by mouth every Monday, Wednesday, and Friday.  Marland Kitchen spironolactone (ALDACTONE) 25 MG tablet Take 25 mg by mouth. Taking TTSS  . [DISCONTINUED] amLODipine (NORVASC) 5 MG tablet Take 2.5 mg by mouth daily.     Allergies  Allergen Reactions  . Coreg [Carvedilol] Shortness Of Breath  . Caffeine Other (See Comments)    Makes heart race  .  Clonidine Derivatives Other (See Comments)    Pt does not remember what the reaction was  . Codeine Other (See Comments)    Does not like the feeling she gets  . Flexeril [Cyclobenzaprine] Other (See Comments)    Pt states "increased heart rate"  . Lipitor [Atorvastatin] Dermatitis    "feels like bugs are biting her"   . Lisinopril     LIP NUMBNESS  . Pravastatin Other (See Comments)    FEELS LIKE "BUG BITES" BITING HER LEGS   . Tape Rash    TEGADERM.   (use opsite on PAC)  . Tegaderm Ag Mesh [Silver] Rash and Other (See Comments)    Burns skin  . Ultram [Tramadol] Palpitations  Several medications were listed as allergies because of interaction with Rythmol. Since she is no longer on them, they were taken off her list.   Social History   Social History  . Marital status: Married    Spouse name: N/A  . Number of children: N/A  . Years of education: N/A   Social History Main Topics  . Smoking status: Never Smoker  . Smokeless tobacco: Never Used  . Alcohol use No  . Drug use: No  . Sexual activity: Not Currently   Other Topics Concern  . None   Social History Narrative   Married mother of 3, grandmother 95.   Exercises 6/7 days a week walking 30 minutes a time.   Never smoked or drank alcohol.   Family History  Problem Relation Age of Onset  . Hypertension Mother   . Basal cell carcinoma Mother   . AAA (abdominal aortic aneurysm) Father   . Heart Problems Maternal Uncle   . Heart Problems Maternal Grandfather   . AAA (abdominal aortic aneurysm) Paternal Grandmother   . Prostate cancer Maternal Uncle 70  . Prostate cancer Other   . Other Daughter     one daughter had TAH-BSO at 65; other daughter will have one soon    Wt Readings from Last 3 Encounters:  02/17/17 61.5 kg (135 lb 9.6 oz)  01/27/17 61.1 kg (134 lb 12.8 oz)  12/20/16 61.3 kg (135 lb 1.6 oz)    PHYSICAL EXAM BP 135/80   Pulse 88   Ht 5\' 3"  (1.6 m)   Wt 61.5 kg (135 lb 9.6 oz)   BMI 24.02  kg/m  General appearance: alert, cooperative, appears stated age and no distress; wearing wig (loss of hair 2/2 chemo), In good spirits - pleasant afffect HEENT: [place]/AT, EOMI, MMM, anicteric sclera Neck: no adenopathy, no carotid bruit, no JVD and supple, symmetrical, trachea midline Lungs: CTAP,  normal percussion bilaterally and Nonlabored, good air movement Heart: regular rate and rhythm, S1, S2 normal, no murmur, click, rub or gallop and normal apical impulse Abdomen: soft, non-tender; bowel sounds normal; no masses, no organomegaly Extremities: extremities normal, atraumatic, no cyanosis or edema; no significant bruising. Pulses: 2+ and symmetric Neurologic: Alert and oriented X 3, normal strength and tone. Normal symmetric reflexes. Normal coordination and gait; Cranial nerves: normal (II-XII grossly intact)    Adult ECG Report - Not checked  Other studies Reviewed: Additional studies/ records that were reviewed today include:  Recent Labs:   Most recent labs from her PCP February 2018: total cholesterol 225, triglycerides 71, HDL 69, LDL 142; TSH 1.4 with normal free T4. CBC: W3.6, H/H 13.4/35.4, platelets 125.   Sodium 139, potassium 4.5, chloride 102, bicarbonate 30, BUN/creatinine 15/0.8, glucose 94. Normal LFTs.  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 No anginal symptoms. Not on a beta blocker because of chronotropic incompetence and fatigue rated she is on amiodarone which has some beta-blockade. She is on ARB. Not on aspirin and Plavix because of this ELIQUIS. She is also not on statin. - -Plan is to have her potentially follow-up with our lipid clinic to discuss management of her LDL and total cholesterol levels.      Dyslipidemia, goal LDL below 70 (Chronic)     Not adequately controlled with Zetia. In fact there really was no change. She has had medication reactions to statins in the past. Plan for now will be to refer her to our clinical pharmacist run lipid clinic. I've asked that they contact her to set up an appointment and discuss consideration for PCSK9 inhibitor such as Repatha or Praluent.      Essential hypertension (Chronic)    She has been off and on hypertension and hypotension depending on what she was on her cancer medications. At present her blood pressures pretty well-controlled and she is only on ARB plus amlodipine. With her issues having her hair falling out with amlodipine, stop that. If her blood pressure goes higher, I would potentially consider adding HCTZ to her spironolactone and ARB.      NSTEMI (non-ST elevated myocardial infarction) ([place])    Only more related to demand ischemia in the setting of her fixed branch disease and A. fib RVR. As a result we are be more aggressive with rhythm control of rate control.      On amiodarone therapy (Chronic)    She needs routine follow-up of LFTs, TFTs read these above been done.  She is also due for PFTs Also needs annual eye exam.      PAF (paroxysmal atrial fibrillation) ([place]): Symptomatic - Rhythm control with Amiodarone. CHA2DS2Vasc Score 7: On Eliquis (Chronic)    Maintaining sinus rhythm with amiodarone at 100 mg daily. No longer on beta blocker because of chronotropic incompetence. She is on ELIQUIS without any bleeding issues now. Planned when necessary additional dosing of amiodarone for tachycardia spells.      Stroke with cerebral ischemia ([facility]) (Chronic)    Thought to be cardioembolic. As a result she is now on ELIQUIS as opposed to Plavix.       Other Visit Diagnoses    Long term current use of amiodarone    -  Primary   Relevant Orders  Pulmonary function test      Current medicines are reviewed at length with the patient today. (+/- concerns) none - just  asked about medication interactions with Rythmol since she is no longer on Rythmol. Hair falling out with amlodipine The following changes have been made:  Patient Instructions  Medication Instructions: STOP Amlodipine   Follow-Up: Your physician recommends that you schedule a follow-up appointment with PharmD for starting Repatha.  Your physician wants you to follow-up in: 6 months with Dr. Ellyn Hack. You will receive a reminder letter in the mail two months in advance. If you don't receive a letter, please call our office to schedule the follow-up appointment.  If you need a refill on your cardiac medications before your next appointment, please call your pharmacy.   Studies Ordered:   Orders Placed This Encounter  Procedures  . Pulmonary function test    Glenetta Hew, M.D., M.S. Interventional Cardiologist   Pager # [phone] Phone # [phone] [address] [place], [place] [address]

## 2017-02-18 ENCOUNTER — Encounter: Payer: Medicare Other | Admitting: Physical Therapy

## 2017-02-19 ENCOUNTER — Encounter: Payer: Self-pay | Admitting: Cardiology

## 2017-02-19 NOTE — Assessment & Plan Note (Signed)
He has borderline obstructive disease being in the branch vessels and distal LAD by catheterization. No anginal symptoms. Not on a beta blocker because of chronotropic incompetence and fatigue rated she is on amiodarone which has some beta-blockade. She is on ARB. Not on aspirin and Plavix because of this ELIQUIS. She is also not on statin. - -Plan is to have her potentially follow-up with our lipid clinic to discuss management of her LDL and total cholesterol levels.

## 2017-02-19 NOTE — Assessment & Plan Note (Signed)
She needs routine follow-up of LFTs, TFTs read these above been done.  She is also due for PFTs Also needs annual eye exam.

## 2017-02-19 NOTE — Assessment & Plan Note (Signed)
She has been off and on hypertension and hypotension depending on what she was on her cancer medications. At present her blood pressures pretty well-controlled and she is only on ARB plus amlodipine. With her issues having her hair falling out with amlodipine, stop that. If her blood pressure goes higher, I would potentially consider adding HCTZ to her spironolactone and ARB.

## 2017-02-19 NOTE — Assessment & Plan Note (Signed)
Not adequately controlled with Zetia. In fact there really was no change. She has had medication reactions to statins in the past. Plan for now will be to refer her to our clinical pharmacist run lipid clinic. I've asked that they contact her to set up an appointment and discuss consideration for PCSK9 inhibitor such as Repatha or Praluent.

## 2017-02-19 NOTE — Assessment & Plan Note (Signed)
Maintaining sinus rhythm with amiodarone at 100 mg daily. No longer on beta blocker because of chronotropic incompetence. She is on ELIQUIS without any bleeding issues now. Planned when necessary additional dosing of amiodarone for tachycardia spells.

## 2017-02-19 NOTE — Assessment & Plan Note (Signed)
Thought to be cardioembolic. As a result she is now on ELIQUIS as opposed to Plavix.

## 2017-02-19 NOTE — Assessment & Plan Note (Signed)
Only more related to demand ischemia in the setting of her fixed branch disease and A. fib RVR. As a result we are be more aggressive with rhythm control of rate control.

## 2017-03-03 ENCOUNTER — Other Ambulatory Visit: Payer: Self-pay | Admitting: Internal Medicine

## 2017-03-03 DIAGNOSIS — Z1231 Encounter for screening mammogram for malignant neoplasm of breast: Secondary | ICD-10-CM

## 2017-03-07 ENCOUNTER — Ambulatory Visit (HOSPITAL_COMMUNITY)
Admission: RE | Admit: 2017-03-07 | Discharge: 2017-03-07 | Disposition: A | Discharge status: Home / Self Care | Payer: Medicare Other | Source: Ambulatory Visit | Attending: Cardiology | Admitting: Cardiology

## 2017-03-07 ENCOUNTER — Other Ambulatory Visit: Payer: Self-pay | Admitting: Cardiology

## 2017-03-07 DIAGNOSIS — I2510 Atherosclerotic heart disease of native coronary artery without angina pectoris: Secondary | ICD-10-CM | POA: Diagnosis not present

## 2017-03-07 DIAGNOSIS — Z79899 Other long term (current) drug therapy: Secondary | ICD-10-CM

## 2017-03-07 DIAGNOSIS — I4891 Unspecified atrial fibrillation: Secondary | ICD-10-CM | POA: Insufficient documentation

## 2017-03-07 LAB — PULMONARY FUNCTION TEST
DL/VA % PRED: 91 %
DL/VA: 4.28 ml/min/mmHg/L
DLCO unc % pred: 81 %
DLCO unc: 18.78 ml/min/mmHg
FEF 25-75 PRE: 2.45 L/s
FEF2575-%PRED-PRE: 146 %
FEV1-%PRED-PRE: 108 %
FEV1-PRE: 2.23 L
FEV1FVC-%Pred-Pre: 111 %
FEV6-%Pred-Pre: 101 %
FEV6-PRE: 2.65 L
FEV6FVC-%PRED-PRE: 105 %
FVC-%Pred-Pre: 96 %
FVC-Pre: 2.65 L
Pre FEV1/FVC ratio: 84 %
Pre FEV6/FVC Ratio: 100 %
RV % pred: 98 %
RV: 2.18 L
TLC % pred: 101 %
TLC: 4.97 L

## 2017-03-07 NOTE — Progress Notes (Signed)
PFT results reviewed. They look relatively stable if not improved compared to last year.  Relatively normal.  No sign of amiodarone toxicity  Glenetta Hew, MD

## 2017-03-08 ENCOUNTER — Telehealth (INDEPENDENT_AMBULATORY_CARE_PROVIDER_SITE_OTHER): Payer: Self-pay | Admitting: *Deleted

## 2017-03-08 ENCOUNTER — Other Ambulatory Visit (INDEPENDENT_AMBULATORY_CARE_PROVIDER_SITE_OTHER): Payer: Self-pay

## 2017-03-08 ENCOUNTER — Telehealth: Payer: Self-pay | Admitting: *Deleted

## 2017-03-08 NOTE — Telephone Encounter (Signed)
This will not interfere with her knee surgery from my standpoint.

## 2017-03-08 NOTE — Telephone Encounter (Signed)
Pt calling stating she is has a dentist appt 4/12 for crown preporation. She wants to make sure this will not interfere with her knee surgery.

## 2017-03-08 NOTE — Telephone Encounter (Signed)
LEFT MESSAGE ON VOICE MESSAGE TO CALL BACK, HOME PHONE - NO ANSWER MACHINE

## 2017-03-08 NOTE — Telephone Encounter (Signed)
LMOM for patient she no longer needs premed before dental appointments

## 2017-03-08 NOTE — Telephone Encounter (Signed)
-----   Message from Leonie Man, MD sent at 03/07/2017  4:38 PM EDT ----- PFT results reviewed. They look relatively stable if not improved compared to last year.  Relatively normal.  No sign of amiodarone toxicity  Glenetta Hew, MD

## 2017-03-08 NOTE — Progress Notes (Signed)
Surgery on 4/20.  Preop on 4/13.  Need orders in epic  Thank You

## 2017-03-08 NOTE — Telephone Encounter (Signed)
Please advise 

## 2017-03-09 ENCOUNTER — Other Ambulatory Visit (INDEPENDENT_AMBULATORY_CARE_PROVIDER_SITE_OTHER): Payer: Self-pay | Admitting: Physician Assistant

## 2017-03-10 NOTE — Patient Instructions (Addendum)
[name]  03/10/2017   Your procedure is scheduled on: 03-18-17   Report to [facility] Main  Entrance Take [place]  elevators to 3rd floor to  [place] at 1205 PM.     Call this number if you have problems the morning of surgery [phone]    Remember: ONLY 1 PERSON MAY GO WITH YOU TO SHORT STAY TO GET  READY MORNING OF [place].  Do not eat after midnight. You may have a clear liquid diet from Midnight until 8AM. Then nothing by mouth     CLEAR LIQUID DIET   Foods Allowed                                                                     Foods Excluded  Coffee and tea, regular and decaf                             liquids that you cannot  Plain Jell-O in any flavor                                             see through such as: Fruit ices (not with fruit pulp)                                     milk, soups, orange juice  Iced Popsicles                                    All solid food Carbonated beverages, regular and diet                                    Cranberry, grape and apple juices Sports drinks like Gatorade Lightly seasoned clear broth or consume(fat free) Sugar, honey syrup  Sample Menu Breakfast                                Lunch                                     Supper Cranberry juice                    Beef broth                            Chicken broth Jell-O                                     Grape juice  Apple juice Coffee or tea                        Jell-O                                      Popsicle                                                Coffee or tea                        Coffee or tea  _____________________________________________________________________     Take these medicines the morning of surgery with A SIP OF WATER: Amiodarone (Pacerone), Levothyroxine (Synthroid), and you may also bring and use your nasal spray                               You may not have  any metal on your body including hair pins and              piercings  Do not wear jewelry, make-up, lotions, powders or perfumes, deodorant             Do not wear nail polish.  Do not shave  48 hours prior to surgery.                Do not bring valuables to the hospital. [place].  Contacts, dentures or bridgework may not be worn into surgery.  Leave suitcase in the car. After surgery it may be brought to your room.                  Please read over the following fact sheets you were given: _____________________________________________________________________   [facility] - Preparing for Surgery Before surgery, you can play an important role.  Because skin is not sterile, your skin needs to be as free of germs as possible.  You can reduce the number of germs on your skin by washing with CHG (chlorahexidine gluconate) soap before surgery.  CHG is an antiseptic cleaner which kills germs and bonds with the skin to continue killing germs even after washing. Please DO NOT use if you have an allergy to CHG or antibacterial soaps.  If your skin becomes reddened/irritated stop using the CHG and inform your nurse when you arrive at Short Stay. Do not shave (including legs and underarms) for at least 48 hours prior to the first CHG shower.  You may shave your face/neck. Please follow these instructions carefully:  1.  Shower with CHG Soap the night before surgery and the  morning of Surgery.  2.  If you choose to wash your hair, wash your hair first as usual with your  normal  shampoo.  3.  After you shampoo, rinse your hair and body thoroughly to remove the  shampoo.                           4.  Use CHG as you would any other liquid soap.  You  can apply chg directly  to the skin and wash                       Gently with a scrungie or clean washcloth.  5.  Apply the CHG Soap to your body ONLY FROM THE NECK DOWN.   Do not use on face/ open                            Wound or open sores. Avoid contact with eyes, ears mouth and genitals (private parts).                       Wash face,  Genitals (private parts) with your normal soap.             6.  Wash thoroughly, paying special attention to the area where your surgery  will be performed.  7.  Thoroughly rinse your body with warm water from the neck down.  8.  DO NOT shower/wash with your normal soap after using and rinsing off  the CHG Soap.                9.  Pat yourself dry with a clean towel.            10.  Wear clean pajamas.            11.  Place clean sheets on your bed the night of your first shower and do not  sleep with pets. Day of Surgery : Do not apply any lotions/deodorants the morning of surgery.  Please wear clean clothes to the hospital/surgery center.  FAILURE TO FOLLOW THESE INSTRUCTIONS MAY RESULT IN THE CANCELLATION OF YOUR SURGERY PATIENT SIGNATURE_________________________________  NURSE SIGNATURE__________________________________  ________________________________________________________________________             [name]  An incentive spirometer is a tool that can help keep your lungs clear and active. This tool measures how well you are filling your lungs with each breath. Taking long deep breaths may help reverse or decrease the chance of developing breathing (pulmonary) problems (especially infection) following:  A long period of time when you are unable to move or be active. BEFORE THE PROCEDURE   If the spirometer includes an indicator to show your best effort, your nurse or respiratory therapist will set it to a desired goal.  If possible, sit up straight or lean slightly forward. Try not to slouch.  Hold the incentive spirometer in an upright position. INSTRUCTIONS FOR USE  1. Sit on the edge of your bed if possible, or sit up as far as you can in bed or on a chair. 2. Hold the incentive spirometer in an upright  position. 3. Breathe out normally. 4. Place the mouthpiece in your mouth and seal your lips tightly around it. 5. Breathe in slowly and as deeply as possible, raising the piston or the ball toward the top of the column. 6. Hold your breath for 3-5 seconds or for as long as possible. Allow the piston or ball to fall to the bottom of the column. 7. Remove the mouthpiece from your mouth and breathe out normally. 8. Rest for a few seconds and repeat Steps 1 through 7 at least 10 times every 1-2 hours when you are awake. Take your time and take a few normal breaths between deep breaths. 9. The spirometer may include an indicator to show your best  effort. Use the indicator as a goal to work toward during each repetition. 10. After each set of 10 deep breaths, practice coughing to be sure your lungs are clear. If you have an incision (the cut made at the time of surgery), support your incision when coughing by placing a pillow or rolled up towels firmly against it. Once you are able to get out of bed, walk around indoors and cough well. You may stop using the incentive spirometer when instructed by your caregiver.  RISKS AND COMPLICATIONS  Take your time so you do not get dizzy or light-headed.  If you are in pain, you may need to take or ask for pain medication before doing incentive spirometry. It is harder to take a deep breath if you are having pain. AFTER USE  Rest and breathe slowly and easily.  It can be helpful to keep track of a log of your progress. Your caregiver can provide you with a simple table to help with this. If you are using the spirometer at home, follow these instructions: [place] IF:   You are having difficultly using the spirometer.  You have trouble using the spirometer as often as instructed.  Your pain medication is not giving enough relief while using the spirometer.  You develop fever of 100.5 F (38.1 C) or higher. SEEK IMMEDIATE MEDICAL CARE IF:    You cough up bloody sputum that had not been present before.  You develop fever of 102 F (38.9 C) or greater.  You develop worsening pain at or near the incision site. MAKE SURE YOU:   Understand these instructions.  Will watch your condition.  Will get help right away if you are not doing well or get worse. Document Released: 03/28/2007 Document Revised: 02/07/2012 Document Reviewed: 05/29/2007 [facility] Patient Information 2014 [place], [place].   ________________________________________________________________________

## 2017-03-10 NOTE — Progress Notes (Signed)
06-14-16 (EPIC) EKG

## 2017-03-11 ENCOUNTER — Encounter (HOSPITAL_COMMUNITY)
Admission: RE | Admit: 2017-03-11 | Discharge: 2017-03-11 | Disposition: A | Discharge status: Home / Self Care | Payer: Medicare Other | Source: Ambulatory Visit | Attending: Orthopaedic Surgery | Admitting: Orthopaedic Surgery

## 2017-03-11 ENCOUNTER — Encounter (HOSPITAL_COMMUNITY): Payer: Self-pay

## 2017-03-11 DIAGNOSIS — Z01812 Encounter for preprocedural laboratory examination: Secondary | ICD-10-CM | POA: Insufficient documentation

## 2017-03-11 DIAGNOSIS — M25452 Effusion, left hip: Secondary | ICD-10-CM | POA: Insufficient documentation

## 2017-03-11 LAB — BASIC METABOLIC PANEL
ANION GAP: 5 (ref 5–15)
BUN: 18 mg/dL (ref 6–20)
CALCIUM: 9.6 mg/dL (ref 8.9–10.3)
CO2: 30 mmol/L (ref 22–32)
Chloride: 103 mmol/L (ref 101–111)
Creatinine, Ser: 0.68 mg/dL (ref 0.44–1.00)
GFR calc Af Amer: >60 mL/min (ref >60)
GFR calc non Af Amer: >60 mL/min (ref >60)
GLUCOSE: 89 mg/dL (ref 65–99)
POTASSIUM: 4.6 mmol/L (ref 3.5–5.1)
Sodium: 138 mmol/L (ref 135–145)

## 2017-03-11 LAB — CBC
HEMATOCRIT: 34.9 % — AB (ref 36.0–46.0)
HEMOGLOBIN: 11.8 g/dL — AB (ref 12.0–15.0)
MCH: 33.9 pg (ref 26.0–34.0)
MCHC: 33.8 g/dL (ref 30.0–36.0)
MCV: 100.3 fL — AB (ref 78.0–100.0)
Platelets: 116 10*3/uL — ABNORMAL LOW (ref 150–400)
RBC: 3.48 MIL/uL — ABNORMAL LOW (ref 3.87–5.11)
RDW: 12.9 % (ref 11.5–15.5)
WBC: 4.4 10*3/uL (ref 4.0–10.5)

## 2017-03-11 NOTE — Progress Notes (Signed)
03-11-17 CBC results routed via EPIC to Dr. Ninfa Linden for review.

## 2017-03-11 NOTE — Progress Notes (Signed)
At Preadmission Appt, pt stated that she had received instructions from her doctor as to when she should  discontinue her Eliquis. Pt reported that she was advised to discontinue Eliquis 2 days prior to the procedure, and to remain off of Eliquis 1 day after the procedure.

## 2017-03-14 ENCOUNTER — Telehealth: Payer: Self-pay | Admitting: Cardiology

## 2017-03-14 ENCOUNTER — Telehealth: Payer: Self-pay | Admitting: Pharmacist Clinician (PhC)/ Clinical Pharmacy Specialist

## 2017-03-14 MED ORDER — EVOLOCUMAB WITH INFUSOR 420 MG/3.5ML ~~LOC~~ SOCT: 420.0000 mg | SUBCUTANEOUS | 12 refills | Status: DC

## 2017-03-14 NOTE — Telephone Encounter (Signed)
Spoke with patient.  Will start process for Repatha, patient prefers [employer] monthly.  She knows to let us know if she gets information from the insurer

## 2017-03-14 NOTE — Telephone Encounter (Signed)
Prescription for Repatha

## 2017-03-14 NOTE — Telephone Encounter (Signed)
New message    Pt is calling to find out if Cyril Mourning has a chance to look into the price of this medication. She said she was told she would, and if the medication will be a couple hundred dollars she doesn't need to keep this appt. She said she would not take it.

## 2017-03-15 ENCOUNTER — Ambulatory Visit: Payer: Medicare Other

## 2017-03-15 NOTE — Telephone Encounter (Signed)
LEFT MESSAGE TO CALL BACK

## 2017-03-16 NOTE — Telephone Encounter (Signed)
Patient returning your call,thanks. Patient will be unavailable from around 9 until 1:30 pm. Thanks.

## 2017-03-16 NOTE — Telephone Encounter (Signed)
Returned the phone call to the patient. She stated that she was calling for her PFT results. Per Dr. Ellyn Hack:  Message from Leonie Man, MD sent at 03/07/2017  4:38 PM EDT ----- PFT results reviewed. They look relatively stable if not improved compared to last year.  Relatively normal.  No sign of amiodarone toxicity  Glenetta Hew, MD  The patient verbalized her understanding,

## 2017-03-17 ENCOUNTER — Other Ambulatory Visit: Payer: Medicare Other

## 2017-03-18 ENCOUNTER — Encounter (HOSPITAL_COMMUNITY)
Admission: RE | Disposition: A | Discharge status: Home / Self Care | Payer: Self-pay | Source: Ambulatory Visit | Attending: Orthopaedic Surgery

## 2017-03-18 ENCOUNTER — Observation Stay (HOSPITAL_COMMUNITY)
Admission: RE | Admit: 2017-03-18 | Discharge: 2017-03-19 | Disposition: A | Discharge status: Home / Self Care | Payer: Medicare Other | Source: Ambulatory Visit | Attending: Orthopaedic Surgery | Admitting: Orthopaedic Surgery

## 2017-03-18 ENCOUNTER — Encounter (HOSPITAL_COMMUNITY): Payer: Self-pay | Admitting: Registered Nurse

## 2017-03-18 ENCOUNTER — Observation Stay (HOSPITAL_COMMUNITY): Payer: Medicare Other

## 2017-03-18 ENCOUNTER — Inpatient Hospital Stay (HOSPITAL_COMMUNITY): Payer: Medicare Other | Admitting: Registered Nurse

## 2017-03-18 DIAGNOSIS — Z8249 Family history of ischemic heart disease and other diseases of the circulatory system: Secondary | ICD-10-CM | POA: Insufficient documentation

## 2017-03-18 DIAGNOSIS — I480 Paroxysmal atrial fibrillation: Secondary | ICD-10-CM | POA: Insufficient documentation

## 2017-03-18 DIAGNOSIS — I2510 Atherosclerotic heart disease of native coronary artery without angina pectoris: Secondary | ICD-10-CM | POA: Insufficient documentation

## 2017-03-18 DIAGNOSIS — I739 Peripheral vascular disease, unspecified: Secondary | ICD-10-CM | POA: Insufficient documentation

## 2017-03-18 DIAGNOSIS — Z7901 Long term (current) use of anticoagulants: Secondary | ICD-10-CM | POA: Diagnosis not present

## 2017-03-18 DIAGNOSIS — Z96641 Presence of right artificial hip joint: Secondary | ICD-10-CM | POA: Insufficient documentation

## 2017-03-18 DIAGNOSIS — Z9221 Personal history of antineoplastic chemotherapy: Secondary | ICD-10-CM | POA: Insufficient documentation

## 2017-03-18 DIAGNOSIS — Z85828 Personal history of other malignant neoplasm of skin: Secondary | ICD-10-CM | POA: Insufficient documentation

## 2017-03-18 DIAGNOSIS — D696 Thrombocytopenia, unspecified: Secondary | ICD-10-CM | POA: Diagnosis not present

## 2017-03-18 DIAGNOSIS — Z8042 Family history of malignant neoplasm of prostate: Secondary | ICD-10-CM | POA: Insufficient documentation

## 2017-03-18 DIAGNOSIS — T84023A Instability of internal left knee prosthesis, initial encounter: Principal | ICD-10-CM | POA: Insufficient documentation

## 2017-03-18 DIAGNOSIS — T84093A Other mechanical complication of internal left knee prosthesis, initial encounter: Secondary | ICD-10-CM

## 2017-03-18 DIAGNOSIS — Z79899 Other long term (current) drug therapy: Secondary | ICD-10-CM | POA: Insufficient documentation

## 2017-03-18 DIAGNOSIS — M659 Synovitis and tenosynovitis, unspecified: Secondary | ICD-10-CM | POA: Insufficient documentation

## 2017-03-18 DIAGNOSIS — M25462 Effusion, left knee: Secondary | ICD-10-CM | POA: Insufficient documentation

## 2017-03-18 DIAGNOSIS — E039 Hypothyroidism, unspecified: Secondary | ICD-10-CM | POA: Insufficient documentation

## 2017-03-18 DIAGNOSIS — X58XXXA Exposure to other specified factors, initial encounter: Secondary | ICD-10-CM | POA: Insufficient documentation

## 2017-03-18 DIAGNOSIS — I252 Old myocardial infarction: Secondary | ICD-10-CM | POA: Insufficient documentation

## 2017-03-18 DIAGNOSIS — Z96652 Presence of left artificial knee joint: Secondary | ICD-10-CM

## 2017-03-18 DIAGNOSIS — Y792 Prosthetic and other implants, materials and accessory orthopedic devices associated with adverse incidents: Secondary | ICD-10-CM | POA: Diagnosis not present

## 2017-03-18 DIAGNOSIS — Z8543 Personal history of malignant neoplasm of ovary: Secondary | ICD-10-CM | POA: Insufficient documentation

## 2017-03-18 DIAGNOSIS — I10 Essential (primary) hypertension: Secondary | ICD-10-CM | POA: Diagnosis not present

## 2017-03-18 DIAGNOSIS — E785 Hyperlipidemia, unspecified: Secondary | ICD-10-CM | POA: Diagnosis not present

## 2017-03-18 DIAGNOSIS — Z8673 Personal history of transient ischemic attack (TIA), and cerebral infarction without residual deficits: Secondary | ICD-10-CM | POA: Insufficient documentation

## 2017-03-18 DIAGNOSIS — H6120 Impacted cerumen, unspecified ear: Secondary | ICD-10-CM | POA: Insufficient documentation

## 2017-03-18 DIAGNOSIS — Z8589 Personal history of malignant neoplasm of other organs and systems: Secondary | ICD-10-CM | POA: Insufficient documentation

## 2017-03-18 DIAGNOSIS — M1712 Unilateral primary osteoarthritis, left knee: Secondary | ICD-10-CM | POA: Insufficient documentation

## 2017-03-18 DIAGNOSIS — Z885 Allergy status to narcotic agent status: Secondary | ICD-10-CM | POA: Insufficient documentation

## 2017-03-18 DIAGNOSIS — Z91048 Other nonmedicinal substance allergy status: Secondary | ICD-10-CM | POA: Insufficient documentation

## 2017-03-18 DIAGNOSIS — J310 Chronic rhinitis: Secondary | ICD-10-CM | POA: Diagnosis not present

## 2017-03-18 DIAGNOSIS — Z888 Allergy status to other drugs, medicaments and biological substances status: Secondary | ICD-10-CM | POA: Insufficient documentation

## 2017-03-18 DIAGNOSIS — Z808 Family history of malignant neoplasm of other organs or systems: Secondary | ICD-10-CM | POA: Insufficient documentation

## 2017-03-18 HISTORY — PX: SYNOVECTOMY WITH POLY EXCHANGE: SHX6746

## 2017-03-18 SURGERY — SYNOVECTOMY WITH POLY EXCHANGE
Anesthesia: General | Site: Knee | Laterality: Left

## 2017-03-18 MED ORDER — EPHEDRINE SULFATE 50 MG/ML IJ SOLN
INTRAMUSCULAR | Status: DC | PRN
  Administered 2017-03-18 (×2): 5 mg via INTRAVENOUS

## 2017-03-18 MED ORDER — HYDROMORPHONE HCL 2 MG/ML IJ SOLN: 0.5000 mg | INTRAMUSCULAR | Status: DC | PRN

## 2017-03-18 MED ORDER — AMIODARONE HCL 100 MG PO TABS
100.0000 mg | ORAL_TABLET | Freq: Every day | ORAL | Status: DC
  Administered 2017-03-19: 100 mg via ORAL
  Filled 2017-03-18: qty 1

## 2017-03-18 MED ORDER — HYDROMORPHONE HCL 2 MG/ML IJ SOLN
INTRAMUSCULAR | Status: AC
  Administered 2017-03-18: 0.5 mg via INTRAVENOUS
  Filled 2017-03-18: qty 1

## 2017-03-18 MED ORDER — LACTATED RINGERS IV SOLN
INTRAVENOUS | Status: DC
  Administered 2017-03-18 (×2): 1000 mL via INTRAVENOUS
  Administered 2017-03-18: 12:00:00 via INTRAVENOUS

## 2017-03-18 MED ORDER — PROPOFOL 10 MG/ML IV BOLUS
INTRAVENOUS | Status: DC | PRN
  Administered 2017-03-18: 150 mg via INTRAVENOUS

## 2017-03-18 MED ORDER — LEVOTHYROXINE SODIUM 88 MCG PO TABS
88.0000 ug | ORAL_TABLET | Freq: Every day | ORAL | Status: DC
  Administered 2017-03-19: 88 ug via ORAL
  Filled 2017-03-18 (×2): qty 1

## 2017-03-18 MED ORDER — ACETAMINOPHEN 500 MG PO TABS: 1000.0000 mg | ORAL_TABLET | Freq: Four times a day (QID) | ORAL | Status: DC | PRN

## 2017-03-18 MED ORDER — OXYCODONE HCL 5 MG/5ML PO SOLN
5.0000 mg | Freq: Once | ORAL | Status: DC | PRN
  Filled 2017-03-18: qty 5

## 2017-03-18 MED ORDER — SUFENTANIL CITRATE 50 MCG/ML IV SOLN
INTRAVENOUS | Status: DC | PRN
  Administered 2017-03-18: 5 ug via INTRAVENOUS
  Administered 2017-03-18: 10 ug via INTRAVENOUS
  Administered 2017-03-18 (×2): 5 ug via INTRAVENOUS

## 2017-03-18 MED ORDER — EPHEDRINE 5 MG/ML INJ
INTRAVENOUS | Status: AC
  Filled 2017-03-18: qty 10

## 2017-03-18 MED ORDER — SODIUM CHLORIDE 0.9% FLUSH
10.0000 mL | Freq: Two times a day (BID) | INTRAVENOUS | Status: DC
  Administered 2017-03-18 – 2017-03-19 (×2): 10 mL

## 2017-03-18 MED ORDER — OXYCODONE HCL 5 MG PO TABS: 5.0000 mg | ORAL_TABLET | ORAL | Status: DC | PRN

## 2017-03-18 MED ORDER — POLYVINYL ALCOHOL 1.4 % OP SOLN
Freq: Two times a day (BID) | OPHTHALMIC | Status: DC
  Filled 2017-03-18: qty 15

## 2017-03-18 MED ORDER — ACETAMINOPHEN 650 MG RE SUPP: 650.0000 mg | Freq: Four times a day (QID) | RECTAL | Status: DC | PRN

## 2017-03-18 MED ORDER — FENTANYL CITRATE (PF) 100 MCG/2ML IJ SOLN
INTRAMUSCULAR | Status: DC | PRN
  Administered 2017-03-18 (×2): 50 ug via INTRAVENOUS

## 2017-03-18 MED ORDER — SUGAMMADEX SODIUM 200 MG/2ML IV SOLN
INTRAVENOUS | Status: DC | PRN
  Administered 2017-03-18: 150 mg via INTRAVENOUS

## 2017-03-18 MED ORDER — SODIUM CHLORIDE 0.9 % IV SOLN
INTRAVENOUS | Status: DC | PRN
  Administered 2017-03-18 (×3): 80 ug via INTRAVENOUS
  Administered 2017-03-18: 160 ug via INTRAVENOUS

## 2017-03-18 MED ORDER — BUPIVACAINE-EPINEPHRINE (PF) 0.5% -1:200000 IJ SOLN
INTRAMUSCULAR | Status: DC | PRN
  Administered 2017-03-18: 25 mL via PERINEURAL

## 2017-03-18 MED ORDER — FENTANYL CITRATE (PF) 100 MCG/2ML IJ SOLN
INTRAMUSCULAR | Status: AC
  Filled 2017-03-18: qty 2

## 2017-03-18 MED ORDER — SODIUM CHLORIDE 0.9 % IR SOLN
Status: DC | PRN
  Administered 2017-03-18: 2000 mL

## 2017-03-18 MED ORDER — TRAMADOL HCL 50 MG PO TABS
100.0000 mg | ORAL_TABLET | Freq: Four times a day (QID) | ORAL | Status: DC | PRN
Start: 2017-03-18 — End: 2017-03-19

## 2017-03-18 MED ORDER — PROPOFOL 10 MG/ML IV BOLUS
INTRAVENOUS | Status: AC
  Filled 2017-03-18: qty 40

## 2017-03-18 MED ORDER — MIDAZOLAM HCL 2 MG/2ML IJ SOLN
INTRAMUSCULAR | Status: AC
  Filled 2017-03-18: qty 2

## 2017-03-18 MED ORDER — ADULT MULTIVITAMIN W/MINERALS CH
1.0000 | ORAL_TABLET | Freq: Every day | ORAL | Status: DC
  Administered 2017-03-19: 1 via ORAL
  Filled 2017-03-18: qty 1

## 2017-03-18 MED ORDER — ACETAMINOPHEN 325 MG PO TABS: 650.0000 mg | ORAL_TABLET | Freq: Four times a day (QID) | ORAL | Status: DC | PRN

## 2017-03-18 MED ORDER — ONDANSETRON HCL 4 MG PO TABS: 4.0000 mg | ORAL_TABLET | Freq: Four times a day (QID) | ORAL | Status: DC | PRN

## 2017-03-18 MED ORDER — ONDANSETRON HCL 4 MG/2ML IJ SOLN
INTRAMUSCULAR | Status: DC | PRN
  Administered 2017-03-18: 4 mg via INTRAVENOUS

## 2017-03-18 MED ORDER — MIDAZOLAM HCL 5 MG/5ML IJ SOLN
INTRAMUSCULAR | Status: DC | PRN
  Administered 2017-03-18 (×3): 1 mg via INTRAVENOUS

## 2017-03-18 MED ORDER — POLYETHYLENE GLYCOL 3350 17 G PO PACK
17.0000 g | PACK | Freq: Every day | ORAL | Status: DC
  Administered 2017-03-19: 17 g via ORAL
  Filled 2017-03-18: qty 1

## 2017-03-18 MED ORDER — ONDANSETRON HCL 4 MG/2ML IJ SOLN: 4.0000 mg | Freq: Four times a day (QID) | INTRAMUSCULAR | Status: DC | PRN

## 2017-03-18 MED ORDER — LIDOCAINE HCL (CARDIAC) 20 MG/ML IV SOLN
INTRAVENOUS | Status: DC | PRN
  Administered 2017-03-18: 75 mg via INTRAVENOUS
  Administered 2017-03-18: 25 mg via INTRATRACHEAL

## 2017-03-18 MED ORDER — METOCLOPRAMIDE HCL 5 MG PO TABS: 5.0000 mg | ORAL_TABLET | Freq: Three times a day (TID) | ORAL | Status: DC | PRN

## 2017-03-18 MED ORDER — METHOCARBAMOL 1000 MG/10ML IJ SOLN
500.0000 mg | Freq: Four times a day (QID) | INTRAVENOUS | Status: DC | PRN
  Administered 2017-03-18: 500 mg via INTRAVENOUS
  Filled 2017-03-18: qty 550

## 2017-03-18 MED ORDER — DEXAMETHASONE SODIUM PHOSPHATE 10 MG/ML IJ SOLN
INTRAMUSCULAR | Status: DC | PRN
  Administered 2017-03-18: 10 mg via INTRAVENOUS

## 2017-03-18 MED ORDER — IPRATROPIUM BROMIDE 0.06 % NA SOLN
2.0000 | Freq: Every day | NASAL | Status: DC
  Filled 2017-03-18: qty 15

## 2017-03-18 MED ORDER — CEFAZOLIN IN D5W 1 GM/50ML IV SOLN
1.0000 g | Freq: Four times a day (QID) | INTRAVENOUS | Status: AC
  Administered 2017-03-18: 1 g via INTRAVENOUS
  Filled 2017-03-18 (×2): qty 50

## 2017-03-18 MED ORDER — ROCURONIUM BROMIDE 100 MG/10ML IV SOLN
INTRAVENOUS | Status: DC | PRN
  Administered 2017-03-18: 50 mg via INTRAVENOUS

## 2017-03-18 MED ORDER — EZETIMIBE 10 MG PO TABS
10.0000 mg | ORAL_TABLET | Freq: Every day | ORAL | Status: DC
  Administered 2017-03-18: 10 mg via ORAL
  Filled 2017-03-18: qty 1

## 2017-03-18 MED ORDER — PHENOL 1.4 % MT LIQD
1.0000 | OROMUCOSAL | Status: DC | PRN
Start: 2017-03-18 — End: 2017-03-19

## 2017-03-18 MED ORDER — SODIUM CHLORIDE 0.9 % IR SOLN
Status: DC | PRN
  Administered 2017-03-18: 1000 mL

## 2017-03-18 MED ORDER — SUGAMMADEX SODIUM 200 MG/2ML IV SOLN
INTRAVENOUS | Status: AC
  Filled 2017-03-18: qty 2

## 2017-03-18 MED ORDER — MELATONIN 5 MG PO CAPS: 5.0000 mg | ORAL_CAPSULE | Freq: Every evening | ORAL | Status: DC | PRN

## 2017-03-18 MED ORDER — METHOCARBAMOL 500 MG PO TABS: 500.0000 mg | ORAL_TABLET | Freq: Four times a day (QID) | ORAL | Status: DC | PRN

## 2017-03-18 MED ORDER — APIXABAN 5 MG PO TABS
5.0000 mg | ORAL_TABLET | Freq: Two times a day (BID) | ORAL | Status: DC
  Administered 2017-03-19: 5 mg via ORAL
  Filled 2017-03-18: qty 1

## 2017-03-18 MED ORDER — MAGNESIUM HYDROXIDE 400 MG/5ML PO SUSP
30.0000 mL | Freq: Every day | ORAL | Status: DC | PRN
Start: 2017-03-18 — End: 2017-03-19

## 2017-03-18 MED ORDER — PHENYLEPHRINE 40 MCG/ML (10ML) SYRINGE FOR IV PUSH (FOR BLOOD PRESSURE SUPPORT)
PREFILLED_SYRINGE | INTRAVENOUS | Status: AC
  Filled 2017-03-18: qty 20

## 2017-03-18 MED ORDER — CHLORHEXIDINE GLUCONATE 4 % EX LIQD: 60.0000 mL | Freq: Once | CUTANEOUS | Status: DC

## 2017-03-18 MED ORDER — ONDANSETRON HCL 4 MG/2ML IJ SOLN
4.0000 mg | Freq: Four times a day (QID) | INTRAMUSCULAR | Status: DC | PRN
  Administered 2017-03-18: 4 mg via INTRAVENOUS
  Filled 2017-03-18: qty 2

## 2017-03-18 MED ORDER — DEXAMETHASONE SODIUM PHOSPHATE 10 MG/ML IJ SOLN
INTRAMUSCULAR | Status: AC
  Filled 2017-03-18: qty 1

## 2017-03-18 MED ORDER — MENTHOL 3 MG MT LOZG: 1.0000 | LOZENGE | OROMUCOSAL | Status: DC | PRN

## 2017-03-18 MED ORDER — DIPHENHYDRAMINE HCL 25 MG PO CAPS: 50.0000 mg | ORAL_CAPSULE | Freq: Every evening | ORAL | Status: DC | PRN

## 2017-03-18 MED ORDER — ARTIFICIAL TEARS OP OINT
TOPICAL_OINTMENT | OPHTHALMIC | Status: AC
  Filled 2017-03-18: qty 3.5

## 2017-03-18 MED ORDER — HYDROMORPHONE HCL 2 MG/ML IJ SOLN
0.2500 mg | INTRAMUSCULAR | Status: DC | PRN
  Administered 2017-03-18 (×2): 0.5 mg via INTRAVENOUS

## 2017-03-18 MED ORDER — SUFENTANIL CITRATE 50 MCG/ML IV SOLN
INTRAVENOUS | Status: AC
  Filled 2017-03-18: qty 1

## 2017-03-18 MED ORDER — SUCCINYLCHOLINE CHLORIDE 20 MG/ML IJ SOLN
INTRAMUSCULAR | Status: DC | PRN
  Administered 2017-03-18: 100 mg via INTRAVENOUS

## 2017-03-18 MED ORDER — CEFAZOLIN SODIUM-DEXTROSE 2-4 GM/100ML-% IV SOLN
2.0000 g | INTRAVENOUS | Status: AC
  Administered 2017-03-18: 2 g via INTRAVENOUS
  Filled 2017-03-18: qty 100

## 2017-03-18 MED ORDER — SPIRONOLACTONE 25 MG PO TABS
25.0000 mg | ORAL_TABLET | ORAL | Status: DC
  Administered 2017-03-19: 25 mg via ORAL
  Filled 2017-03-18: qty 1

## 2017-03-18 MED ORDER — BIOTIN 10 MG PO TABS: 10.0000 mg | ORAL_TABLET | Freq: Every day | ORAL | Status: DC

## 2017-03-18 MED ORDER — IRBESARTAN 300 MG PO TABS
300.0000 mg | ORAL_TABLET | Freq: Every day | ORAL | Status: DC
  Administered 2017-03-18 – 2017-03-19 (×2): 300 mg via ORAL
  Filled 2017-03-18 (×2): qty 1

## 2017-03-18 MED ORDER — ONDANSETRON HCL 4 MG/2ML IJ SOLN
INTRAMUSCULAR | Status: AC
  Filled 2017-03-18: qty 2

## 2017-03-18 MED ORDER — OXYCODONE HCL 5 MG PO TABS
5.0000 mg | ORAL_TABLET | Freq: Once | ORAL | Status: DC | PRN
Start: 2017-03-18 — End: 2017-03-18

## 2017-03-18 MED ORDER — KETOROLAC TROMETHAMINE 15 MG/ML IJ SOLN
7.5000 mg | Freq: Four times a day (QID) | INTRAMUSCULAR | Status: DC
  Administered 2017-03-18 – 2017-03-19 (×2): 7.5 mg via INTRAVENOUS
  Filled 2017-03-18 (×2): qty 1

## 2017-03-18 MED ORDER — METOCLOPRAMIDE HCL 5 MG/ML IJ SOLN: 5.0000 mg | Freq: Three times a day (TID) | INTRAMUSCULAR | Status: DC | PRN

## 2017-03-18 SURGICAL SUPPLY — 48 items
BAG ZIPLOCK 12X15 (MISCELLANEOUS) ×2 IMPLANT
BANDAGE ACE 6X5 VEL STRL LF (GAUZE/BANDAGES/DRESSINGS) ×2 IMPLANT
BEARIN INSERT TIBIAL 3X13 (Insert) ×2 IMPLANT
BEARING INSERT TIBIAL 3X13 (Insert) ×1 IMPLANT
BENZOIN TINCTURE PRP APPL 2/3 (GAUZE/BANDAGES/DRESSINGS) ×2 IMPLANT
COVER SURGICAL LIGHT HANDLE (MISCELLANEOUS) ×2 IMPLANT
CUFF TOURN SGL QUICK 34 (TOURNIQUET CUFF) ×1
CUFF TRNQT CYL 34X4X40X1 (TOURNIQUET CUFF) ×1 IMPLANT
DRAPE EXTREMITY T 121X128X90 (DRAPE) ×2 IMPLANT
DRAPE POUCH INSTRU U-SHP 10X18 (DRAPES) ×2 IMPLANT
DRAPE U-SHAPE 47X51 STRL (DRAPES) ×2 IMPLANT
DRSG PAD ABDOMINAL 8X10 ST (GAUZE/BANDAGES/DRESSINGS) ×2 IMPLANT
ELECT REM PT RETURN 15FT ADLT (MISCELLANEOUS) ×2 IMPLANT
EVACUATOR 1/8 PVC DRAIN (DRAIN) ×2 IMPLANT
EVACUATOR SILICONE 100CC (DRAIN) ×2 IMPLANT
FACESHIELD WRAPAROUND (MASK) ×10 IMPLANT
GAUZE SPONGE 4X4 12PLY STRL (GAUZE/BANDAGES/DRESSINGS) ×2 IMPLANT
GLOVE BIO SURGEON STRL SZ7.5 (GLOVE) ×2 IMPLANT
GLOVE BIOGEL PI IND STRL 7.5 (GLOVE) ×4 IMPLANT
GLOVE BIOGEL PI IND STRL 8 (GLOVE) ×2 IMPLANT
GLOVE BIOGEL PI INDICATOR 7.5 (GLOVE) ×4
GLOVE BIOGEL PI INDICATOR 8 (GLOVE) ×2
GLOVE ECLIPSE 8.0 STRL XLNG CF (GLOVE) ×2 IMPLANT
GLOVE SURG SS PI 7.5 STRL IVOR (GLOVE) ×2 IMPLANT
GOWN STRL REUS W/ TWL XL LVL3 (GOWN DISPOSABLE) ×1 IMPLANT
GOWN STRL REUS W/TWL XL LVL3 (GOWN DISPOSABLE) ×5 IMPLANT
HANDPIECE INTERPULSE COAX TIP (DISPOSABLE) ×1
KIT BASIN OR (CUSTOM PROCEDURE TRAY) ×2 IMPLANT
PACK TOTAL JOINT (CUSTOM PROCEDURE TRAY) ×2 IMPLANT
PAD ABD 8X10 STRL (GAUZE/BANDAGES/DRESSINGS) ×2 IMPLANT
PADDING CAST COTTON 6X4 STRL (CAST SUPPLIES) ×2 IMPLANT
POSITIONER SURGICAL ARM (MISCELLANEOUS) ×2 IMPLANT
SET HNDPC FAN SPRY TIP SCT (DISPOSABLE) ×1 IMPLANT
SPONGE LAP 18X18 X RAY DECT (DISPOSABLE) ×2 IMPLANT
STRIP CLOSURE SKIN 1/2X4 (GAUZE/BANDAGES/DRESSINGS) ×2 IMPLANT
SUCTION FRAZIER HANDLE 12FR (TUBING) ×1
SUCTION TUBE FRAZIER 12FR DISP (TUBING) ×1 IMPLANT
SUT MNCRL AB 4-0 PS2 18 (SUTURE) ×2 IMPLANT
SUT VIC AB 0 CT1 36 (SUTURE) ×2 IMPLANT
SUT VIC AB 1 CT1 36 (SUTURE) ×6 IMPLANT
SUT VIC AB 2-0 CT1 27 (SUTURE) ×3
SUT VIC AB 2-0 CT1 TAPERPNT 27 (SUTURE) ×3 IMPLANT
SWAB COLLECTION DEVICE MRSA (MISCELLANEOUS) ×2 IMPLANT
SWAB CULTURE ESWAB REG 1ML (MISCELLANEOUS) ×2 IMPLANT
SYR 50ML LL SCALE MARK (SYRINGE) ×2 IMPLANT
SYR CONTROL 10ML LL (SYRINGE) ×2 IMPLANT
TOWEL OR 17X26 10 PK STRL BLUE (TOWEL DISPOSABLE) ×6 IMPLANT
WRAP KNEE MAXI GEL POST OP (GAUZE/BANDAGES/DRESSINGS) ×2 IMPLANT

## 2017-03-18 NOTE — Anesthesia Procedure Notes (Signed)
Anesthesia Regional Block: Adductor canal block   Pre-Anesthetic Checklist: ,, timeout performed, Correct Patient, Correct Site, Correct Laterality, Correct Procedure, Correct Position, site marked, Risks and benefits discussed,  Surgical consent,  Pre-op evaluation,  At surgeon's request and post-op pain management  Laterality: Left  Prep: chloraprep       Needles:  Injection technique: Single-shot  Needle Type: Echogenic Needle     Needle Length: 9cm  Needle Gauge: 21     Additional Needles:   Procedures: ultrasound guided,,,,,,,,  Narrative:  Start time: 03/18/2017 10:29 AM End time: 03/18/2017 10:35 AM Injection made incrementally with aspirations every 5 mL.  Performed by: Personally  Anesthesiologist: Ashaun Gaughan  Additional Notes: Pt tolerated the procedure well.

## 2017-03-18 NOTE — Anesthesia Preprocedure Evaluation (Signed)
Anesthesia Evaluation  Patient identified by MRN, date of birth, ID band Patient awake    Reviewed: Allergy & Precautions, H&P , NPO status , Patient's Chart, lab work & pertinent test results  Airway Mallampati: II   Neck ROM: full    Dental   Pulmonary neg pulmonary ROS,    breath sounds clear to auscultation       Cardiovascular hypertension, + CAD and + Peripheral Vascular Disease  + dysrhythmias Atrial Fibrillation  Rhythm:regular Rate:Normal  On amiodarone and eliquis.  Recent episode of AF with RVR that created demand ischemia.  Cardiac cath revealed small vessel disease. Medical management. Cardiologist: Dr Ellyn Hack   Neuro/Psych  Neuromuscular disease CVA    GI/Hepatic   Endo/Other  Hypothyroidism   Renal/GU      Musculoskeletal  (+) Arthritis ,   Abdominal   Peds  Hematology   Anesthesia Other Findings   Reproductive/Obstetrics h/o ovarian CA                             Anesthesia Physical Anesthesia Plan  ASA: III  Anesthesia Plan: General   Post-op Pain Management: GA combined w/ Regional for post-op pain   Induction: Intravenous  Airway Management Planned: Oral ETT  Additional Equipment:   Intra-op Plan:   Post-operative Plan: Extubation in OR  Informed Consent: I have reviewed the patients History and Physical, chart, labs and discussed the procedure including the risks, benefits and alternatives for the proposed anesthesia with the patient or authorized representative who has indicated his/her understanding and acceptance.     Plan Discussed with: CRNA, Anesthesiologist and Surgeon  Anesthesia Plan Comments:         Anesthesia Quick Evaluation

## 2017-03-18 NOTE — Transfer of Care (Signed)
Immediate Anesthesia Transfer of Care Note  Patient: [name]  Procedure(s) Performed: Procedure(s) with comments: LEFT KNEE PARTIAL SYNOVECTOMY WITH POLY EXCHANGE (Left) - Adductor Block  Patient Location: PACU  Anesthesia Type:General  Level of Consciousness: awake, alert , oriented and patient cooperative  Airway & Oxygen Therapy: Patient Spontanous Breathing and Patient connected to face mask oxygen  Post-op Assessment: Report given to RN, Post -op Vital signs reviewed and stable and Patient moving all extremities X 4  Post vital signs: stable  Last Vitals:  Vitals:   03/18/17 1054 03/18/17 1248  BP:  140/78  Pulse: 76 84  Resp: 19 19  Temp:  36.8 C    Last Pain:  Vitals:   03/18/17 0915  TempSrc: Oral      Patients Stated Pain Goal: 3 (34/37/35 7897)  Complications: No apparent anesthesia complications

## 2017-03-18 NOTE — Progress Notes (Signed)
PHARMACIST - PHYSICIAN ORDER COMMUNICATION  CONCERNING: P&T Medication Policy on Herbal Medications  DESCRIPTION:  This patient's order for:  Biotin & Melatonin  has been noted.  This product(s) is classified as an "herbal" or natural product. Due to a lack of definitive safety studies or FDA approval, nonstandard manufacturing practices, plus the potential risk of unknown drug-drug interactions while on inpatient medications, the Pharmacy and Therapeutics Committee does not permit the use of "herbal" or natural products of this type within [facility].   ACTION TAKEN: The pharmacy department is unable to verify this order at this time and your patient has been informed of this safety policy. Please reevaluate patient's clinical condition at discharge and address if the herbal or natural product(s) should be resumed at that time.  Minda Ditto PharmD Pager [phone] 03/18/2017, 3:17 PM

## 2017-03-18 NOTE — Anesthesia Postprocedure Evaluation (Signed)
Anesthesia Post Note  Patient: [name]  Procedure(s) Performed: Procedure(s) (LRB): LEFT KNEE PARTIAL SYNOVECTOMY WITH POLY EXCHANGE (Left)  Patient location during evaluation: PACU Anesthesia Type: General Level of consciousness: awake and alert and patient cooperative Pain management: pain level controlled Vital Signs Assessment: post-procedure vital signs reviewed and stable Respiratory status: spontaneous breathing and respiratory function stable Cardiovascular status: stable Anesthetic complications: no       Last Vitals:  Vitals:   03/18/17 1447 03/18/17 1545  BP: 140/87 135/71  Pulse: 75 73  Resp: 16 16  Temp: 36.6 C 36.7 C    Last Pain:  Vitals:   03/18/17 1545  TempSrc: Oral  PainSc:                  [place]

## 2017-03-18 NOTE — H&P (Signed)
TOTAL KNEE REVISION ADMISSION H&P  Patient is being admitted for left revision total knee arthroplasty with most likely a synovectomy and poly-liner exchange  Subjective:  Chief Complaint:left knee pain and swelling with instability  HPI: [name], 76 y.o. female, has a history of pain and functional disability in the left knee(s) due to failed previous arthroplasty and patient has failed non-surgical conservative treatments for greater than 12 weeks to include NSAID's and/or analgesics, flexibility and strengthening excercises and activity modification. The indications for the revision of the total knee arthroplasty are bearing surface wear leading to symptomatic synovitis. Onset of symptoms was gradual starting 1 years ago with gradually worsening course since that time.  Prior procedures on the left knee(s) include arthroplasty.  Patient currently rates pain in the left knee(s) at 5 out of 10 with activity. There is worsening of pain with activity and weight bearing and joint swelling.  Patient has evidence of not gross evidenc of loosening or osteolysis by imaging studies. This condition presents safety issues increasing the risk of falls.  There is no current active infection.  Patient Active Problem List   Diagnosis Date Noted  . Failed total left knee replacement ([place]) 03/18/2017  . Thrombocytopenia ([place]) 03/27/2016  . High risk medications (not anticoagulants) long-term use 03/27/2016  . Chronic anticoagulation 03/27/2016  . Atherosclerotic heart disease of native coronary artery without angina pectoris 03/03/2016  . Bilateral hearing loss 02/16/2016  . Bilateral impacted cerumen 02/16/2016  . Rhinitis, chronic 02/16/2016  . Embolic stroke ([place]) 56/31/4970  . Vaginal atrophy 12/29/2015  . On amiodarone therapy 12/23/2015  . Dyslipidemia, goal LDL below 70   . Stroke with cerebral ischemia ([place])   . Visual disturbance   . Cerebral thrombosis with cerebral infarction  12/08/2015  . NSTEMI (non-ST elevated myocardial infarction) ([place]) 12/04/2015  . 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 Chemotherapy-induced peripheral neuropathy ([place]) 07/23/2015  . Portacath in place 07/23/2015  . Recurrent carcinoma of ovary ([place]) 07/23/2015  . International Federation of Gynecology and Obstetrics (FIGO) 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[place]) 07/23/2015  . Genetic testing 07/14/2015  . Encounter for antineoplastic chemotherapy 06/25/2015  . Port catheter in place 06/25/2015  . Antineoplastic chemotherapy induced pancytopenia ([place]) 06/02/2015  . Dehydration 06/02/2015  . 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 Chemotherapy induced neutropenia ([place]) 05/13/2015  . Chemotherapy induced thrombocytopenia 05/13/2015  . Hyperbilirubinemia 05/06/2015  . Osteoarthritis of left knee 01/03/2015  . Status post total left knee replacement 01/03/2015  . PAF (paroxysmal atrial fibrillation) ([place]): Symptomatic - Rhythm control with Amiodarone. CHA2DS2Vasc Score 7: On Eliquis   . Fever 05/03/2013    Class: Acute  . Nausea 05/03/2013    Class: Acute  . Constipation 05/03/2013    Class: Acute  . Syncope, history of 04/19/2013  . Essential hypertension 04/19/2013  . Hypothyroidism 04/19/2013  . Degenerative arthritis of hip 02/25/2012   Past Medical History:  Diagnosis Date  . Arthritis    OSTEOARTHRITIS   -- CONSTANT PAIN RIGHT HIP---AND PAIN LEFT KNEE--PT STATES SHE GETS INJECTIONS INTO HER KNEE  . Complication of anesthesia    BLOOD PRESSURE DROPPED WITH NASAL SURGERY, ONE OF THE CARPAL TUNNEL REPAIRS AND DURING A COLONOSCOPY  . Dyslipidemia   . History of skin cancer   . Hypertension   . Hypothyroidism   . Menopausal symptoms   . NSTEMI (non-ST elevated myocardial infarction) ([place]) 12/09/15   Medical management: Distal branch of  D1 95%, ostial D2 75%, distal LAD 50%. Tortuous arteries consistent with hypertension  .  Ovarian cancer ([place]) 01/2013   Recurrence since 2014/2016  . PAF (paroxysmal atrial fibrillation) ([place])    On ELIQUIS; On Amiodarone (Eye Exam 12/25/15) - no longer on Rythmol    Past Surgical History:  Procedure Laterality Date  . BILATERAL CARPAL TUNNEL REPAIR  2007  . CARDIAC CATHETERIZATION N/A 12/05/2015   Procedure: Left Heart Cath and Coronary Angiography;  Surgeon: Belva Crome, MD;  Location: [facility];  Service: Cardiovascular;  distal branch of D1 95%, ostial D2 75%, dLAD 50%,p-mRCA 40%. Tortuous vessels.  Marland Kitchen DILATION AND CURETTAGE OF UTERUS  1969  . LAPAROTOMY Bilateral 02/13/2013   Procedure: EXPLORATORY LAPAROTOMY TOTAL ABDOMINAL HYSTERECTOMY BILATERAL SALPINGO-OOPHORECTOMY, Partial Rectal Resection with Reanastamosis;  Surgeon: Alvino Chapel, MD;  Location: [facility];  Service: Gynecology;  Laterality: Bilateral;  . LEFT KNEE ARTHROSCOPY   2011  . LYMPHADENECTOMY Right 02/13/2013   Procedure: PEVLIC  LYMPHADENECTOMY, DEBULKING right pelvic tumor nodules;  Surgeon: Alvino Chapel, MD;  Location: [facility];  Service: Gynecology;  Laterality: Right;  . NM MYOVIEW LTD  June 2010    subbmaximal with no ischemia or infarction.  . OMENTECTOMY  02/13/2013   Procedure: OMENTECTOMY;  Surgeon: Alvino Chapel, MD;  Location: [facility];  Service: Gynecology;;  . [place]  . SURGERY FOR RUPTURED OVARIAN CYST  1969  . TAH/BSO/Tumor debulking with right pelvic LND  01/2013  . TONSILLECTOMY  1962  . TOTAL HIP ARTHROPLASTY  02/25/2012   Procedure: TOTAL HIP ARTHROPLASTY ANTERIOR APPROACH;  Surgeon: Mcarthur Rossetti, MD;  Location: [facility];  Service: Orthopedics;  Laterality: Right;  . TOTAL KNEE ARTHROPLASTY Left 01/03/2015   Procedure: LEFT TOTAL KNEE ARTHROPLASTY;  Surgeon: Mcarthur Rossetti, MD;  Location: [facility];  Service: Orthopedics;  Laterality: Left;  . TRANSTHORACIC ECHOCARDIOGRAM  May 2014; January 2016   a. Normal LV size  function. EF 60-65%. Grade 1 diastolic function. Mild MR and mildly elevated PA pressures of 37 mmHg per;; b. EF 60-65%. Mobile echodensity 10 mm x 6 mm attest interventricular septum is questionable fibroblastoma.    Prescriptions Prior to Admission  Medication Sig Dispense Refill Last Dose  . amiodarone (PACERONE) 200 MG tablet TAKE 1/2 TABLET BY MOUTH DAILY 45 tablet 3 03/18/2017 at Unknown time  . amoxicillin (AMOXIL) 500 MG capsule Take 2,000 mg by mouth See admin instructions. Takes 4 capsules 1 hour prior to procedure   Past Week at Unknown time  . Biotin 10 MG TABS Take 10 mg by mouth daily.    03/17/2017 at Unknown time  . desonide (DESOWEN) 0.05 % cream Apply 1 application topically 2 (two) times daily.   1 Taking  . diphenhydrAMINE (BENADRYL) 25 MG tablet Take 50 mg by mouth at bedtime as needed for sleep.    Past Week at Unknown time  . ELIQUIS 5 MG TABS tablet TAKE 1 TABLET (5 MG TOTAL) BY MOUTH 2 (TWO) TIMES DAILY. 60 tablet 5 03/17/2017 at 0700  . ezetimibe (ZETIA) 10 MG tablet Take 1 tablet (10 mg total) by mouth daily. (Patient taking differently: Take 10 mg by mouth at bedtime. ) 30 tablet 6 03/17/2017 at Unknown time  . Glucosamine HCl 1000 MG TABS Take 1,000 mg by mouth daily.   Taking  . hydrocortisone 1 % ointment Apply 1 application topically 2 (two) times daily as needed for itching (eczema).  Taking  . ipratropium (ATROVENT) 0.06 % nasal spray Place 2 sprays into both nostrils daily.   5 Taking  . irbesartan (AVAPRO) 300 MG tablet TAKE 1 TABLET (300 MG TOTAL) BY MOUTH DAILY. 90 tablet 2 03/17/2017 at Unknown time  . levothyroxine (SYNTHROID) 88 MCG tablet Take 88 mcg by mouth daily before breakfast.   03/18/2017 at Unknown time  . lidocaine-prilocaine (EMLA) cream Apply to Porta-Cath site 1-2 hours prior to access as directed. (Patient taking differently: Apply 1 application topically See admin instructions. Apply to Porta-Cath site 1-2 hours prior to access as directed.) 30 g 2  Taking  . magnesium hydroxide (MILK OF MAGNESIA) 800 MG/5ML suspension Take 30 mLs by mouth daily as needed for constipation. Reported on 11/20/2015   Past Week at Unknown time  . Melatonin 5 MG CAPS Take 5 mg by mouth at bedtime as needed (sleep). Reported on 12/25/2015   Taking  . Menthol, Topical Analgesic, (ICY HOT EX) Apply 1 application topically 2 (two) times daily as needed (PAIN).    Taking  . Misc Natural Products (TART CHERRY ADVANCED) CAPS Take 1 capsule by mouth daily. 1200 mg per capsule   Taking  . Multiple Vitamin (MULTIVITAMIN WITH MINERALS) TABS tablet Take 1 tablet by mouth daily. Centrum Silver   Taking  . Polyethyl Glycol-Propyl Glycol (SYSTANE OP) Place 1 drop into both eyes 2 (two) times daily.    Taking  . polyethylene glycol (MIRALAX / GLYCOLAX) packet Take 17 g by mouth daily. Mix in 8 oz liquid and drink   Taking  . potassium chloride (K-DUR) 10 MEQ tablet Take 10 mEq by mouth every Monday, Wednesday, and Friday.   Taking  . spironolactone (ALDACTONE) 25 MG tablet Take 25 mg by mouth See admin instructions. Takes on Tuesdays, Thursday, Saturday, Sunday   03/17/2017 at Unknown time  . acetaminophen (TYLENOL) 500 MG tablet Take 1,000 mg by mouth every 6 (six) hours as needed for mild pain.    More than a month at Unknown time  . Evolocumab with Infusor ([place]) 420 MG/3.5ML SOCT Inject 420 mg into the skin every 30 (thirty) days. 1 Cartridge 12    Allergies  Allergen Reactions  . Clonidine Derivatives Shortness Of Breath  . Coreg [Carvedilol] Shortness Of Breath  . Caffeine Other (See Comments)    Makes heart race  . Codeine Other (See Comments)    Does not like the feeling she gets  . Flexeril [Cyclobenzaprine] Other (See Comments)    Pt states "increased heart rate"  . Lipitor [Atorvastatin] Dermatitis    "feels like bugs are biting her"   . Lisinopril     LIP NUMBNESS  . Pravastatin Other (See Comments)    FEELS LIKE "BUG BITES" BITING HER LEGS    . Tape Rash    TEGADERM.   (use opsite on PAC)  . Tegaderm Ag Mesh [Silver] Rash and Other (See Comments)    Burns skin  . Ultram [Tramadol] Palpitations    Social History  Substance Use Topics  . Smoking status: Never Smoker  . Smokeless tobacco: Never Used  . Alcohol use No    Family History  Problem Relation Age of Onset  . Hypertension Mother   . Basal cell carcinoma Mother   . AAA (abdominal aortic aneurysm) Father   . Heart Problems Maternal Uncle   . Heart Problems Maternal Grandfather   . AAA (abdominal aortic aneurysm) Paternal Grandmother   . Prostate cancer Maternal Uncle 70  .  Prostate cancer Other   . Other Daughter     one daughter had TAH-BSO at 57; other daughter will have one soon      Review of Systems  Musculoskeletal: Positive for joint pain.  All other systems reviewed and are negative.    Objective:  Physical Exam  Constitutional: She is oriented to person, place, and time. She appears well-developed and well-nourished.  HENT:  Head: Normocephalic and atraumatic.  Eyes: EOM are normal. Pupils are equal, round, and reactive to Owens.  Neck: Normal range of motion. Neck supple.  Cardiovascular: Normal rate.   Respiratory: Effort normal and breath sounds normal.  GI: Soft. Bowel sounds are normal.  Musculoskeletal:       Left knee: She exhibits swelling and effusion.  Neurological: She is alert and oriented to person, place, and time.  Skin: Skin is warm and dry.  Psychiatric: She has a normal mood and affect.    Vital signs in last 24 hours: Temp:  [97.9 F (36.6 C)] 97.9 F (36.6 C) (04/20 0915) Pulse Rate:  [64-76] 76 (04/20 1054) Resp:  [11-20] 19 (04/20 1054) BP: (116-160)/(58-87) 116/58 (04/20 1048) SpO2:  [95 %-100 %] 100 % (04/20 1054) Weight:  [134 lb (60.8 kg)] 134 lb (60.8 kg) (04/20 0915)  Labs:  Estimated body mass index is 23.74 kg/m as calculated from the following:   Height as of this encounter: 5\' 3"  (1.6 m).    Weight as of this encounter: 134 lb (60.8 kg).  Imaging Review Plain radiographs demonstrate a stable appearing left total knee replacement  Assessment/Plan:  Recurrent effusions left total knee replacement with synovitis and possible instability  Will proceed to surgery today for an open arthrotomy to assess her left knee prosthesis with hopefully just a synovectomy and up-sizing her poly-liner vs a full revision.

## 2017-03-18 NOTE — Progress Notes (Signed)
AssistedDr. Marcie Bal with left, ultrasound guided, adductor canal block. Side rails up, monitors on throughout procedure. See vital signs in flow sheet. Tolerated Procedure well.

## 2017-03-18 NOTE — Anesthesia Procedure Notes (Signed)
Procedure Name: Intubation Date/Time: 03/18/2017 11:23 AM Performed by: Lissa Morales Pre-anesthesia Checklist: Patient identified, Emergency Drugs available, Suction available and Patient being monitored Patient Re-evaluated:Patient Re-evaluated prior to inductionOxygen Delivery Method: Circle system utilized Preoxygenation: Pre-oxygenation with 100% oxygen Intubation Type: IV induction Ventilation: Mask ventilation without difficulty Laryngoscope Size: Miller and 2 Grade View: Grade I Tube type: Oral Tube size: 7.5 mm Number of attempts: 1 Airway Equipment and Method: Stylet and Oral airway Placement Confirmation: ETT inserted through vocal cords under direct vision,  positive ETCO2 and breath sounds checked- equal and bilateral Secured at: 21 cm Tube secured with: Tape Dental Injury: Teeth and Oropharynx as per pre-operative assessment

## 2017-03-18 NOTE — Brief Op Note (Signed)
03/18/2017  12:31 PM  PATIENT:  [name]  76 y.o. female  PRE-OPERATIVE DIAGNOSIS:  recurrent effusion left knee  POST-OPERATIVE DIAGNOSIS:  recurrent effusion left knee  PROCEDURE:  Procedure(s) with comments: LEFT KNEE PARTIAL SYNOVECTOMY WITH POLY EXCHANGE (Left) - Adductor Block  SURGEON:  Surgeon(s) and Role:    * Mcarthur Rossetti, MD - Primary  PHYSICIAN ASSISTANT: Benita Stabile, PA-C  ANESTHESIA:   regional and general  EBL:  Total I/O In: 1000 [I.V.:1000] Out: -   COUNTS:  YES  TOURNIQUET:   Total Tourniquet Time Documented: Thigh (Left) - 40 minutes Total: Thigh (Left) - 40 minutes   DICTATION: .Other Dictation: Dictation Number [id number]  PLAN OF CARE: Admit for overnight observation  PATIENT DISPOSITION:  PACU - hemodynamically stable.   Delay start of Pharmacological VTE agent (>24hrs) due to surgical blood loss or risk of bleeding: no

## 2017-03-19 DIAGNOSIS — T84093A Other mechanical complication of internal left knee prosthesis, initial encounter: Secondary | ICD-10-CM

## 2017-03-19 DIAGNOSIS — T84023A Instability of internal left knee prosthesis, initial encounter: Secondary | ICD-10-CM | POA: Diagnosis not present

## 2017-03-19 MED ORDER — METHOCARBAMOL 500 MG PO TABS: 500.0000 mg | ORAL_TABLET | Freq: Four times a day (QID) | ORAL | 0 refills | Status: DC | PRN

## 2017-03-19 MED ORDER — TRAMADOL HCL 50 MG PO TABS: 50.0000 mg | ORAL_TABLET | Freq: Four times a day (QID) | ORAL | 0 refills | Status: DC | PRN

## 2017-03-19 MED ORDER — HYDROMORPHONE HCL 1 MG/ML IJ SOLN
0.5000 mg | INTRAMUSCULAR | Status: DC | PRN
Start: 2017-03-19 — End: 2017-03-19

## 2017-03-19 MED ORDER — HEPARIN SOD (PORK) LOCK FLUSH 100 UNIT/ML IV SOLN
500.0000 [IU] | INTRAVENOUS | Status: AC | PRN
  Administered 2017-03-19: 500 [IU]

## 2017-03-19 NOTE — Discharge Instructions (Signed)
You can get your actual dressing wet daily in the shower. Leave your current dressing on until your outpatient follow-up. Ice and elevation as needed for swelling.

## 2017-03-19 NOTE — Progress Notes (Signed)
OT Cancellation Note  Patient Details Name: [name] MRN: [id number] DOB: 1940-12-27   Cancelled Treatment:    Reason Eval/Treat Not Completed: OT screened, no needs identified, will sign off  [facility], OT/L  [phone] 03/19/2017 03/19/2017, 11:10 AM

## 2017-03-19 NOTE — Discharge Summary (Signed)
Patient ID: [name] MRN: [id number] DOB/AGE: [birth year] 74 y.o.  Admit date: 03/18/2017 Discharge date: 03/19/2017  Admission Diagnoses:  Principal Problem:   Failed total left knee replacement ([name]) Active Problems:   Status post revision of total knee replacement, left   Discharge Diagnoses:  Same  Past Medical History:  Diagnosis Date  . Arthritis    OSTEOARTHRITIS   -- CONSTANT PAIN RIGHT HIP---AND PAIN LEFT KNEE--PT STATES SHE GETS INJECTIONS INTO HER KNEE  . Complication of anesthesia    BLOOD PRESSURE DROPPED WITH NASAL SURGERY, ONE OF THE CARPAL TUNNEL REPAIRS AND DURING A COLONOSCOPY  . Dyslipidemia   . History of skin cancer   . Hypertension   . Hypothyroidism   . Menopausal symptoms   . NSTEMI (non-ST elevated myocardial infarction) ([name]) 12/09/15   Medical management: Distal branch of D1 95%, ostial D2 75%, distal LAD 50%. Tortuous arteries consistent with hypertension  . Ovarian cancer ([name]) 01/2013   Recurrence since 2014/2016  . PAF (paroxysmal atrial fibrillation) ([name])    On ELIQUIS; On Amiodarone (Eye Exam 12/25/15) - no longer on Rythmol    Surgeries: Procedure(s): LEFT KNEE PARTIAL SYNOVECTOMY WITH POLY EXCHANGE on 03/18/2017   Consultants:   Discharged Condition: Improved  Hospital Course: [name] is an 76 y.o. female who was admitted 03/18/2017 for operative treatment ofFailed total left knee replacement ([name]). Patient has severe unremitting pain that affects sleep, daily activities, and work/hobbies. After pre-op clearance the patient was taken to the operating room on 03/18/2017 and underwent  Procedure(s): LEFT KNEE PARTIAL SYNOVECTOMY WITH POLY EXCHANGE.    Patient was given perioperative antibiotics: Anti-infectives    Start     Dose/Rate Route Frequency Ordered Stop   03/18/17 1730  ceFAZolin (ANCEF) IVPB 1 g/50 mL premix     1 g 100 mL/hr over 30 Minutes Intravenous Every 6 hours 03/18/17 1453 03/19/17 0529   03/18/17 0857   ceFAZolin (ANCEF) IVPB 2g/100 mL premix     2 g 200 mL/hr over 30 Minutes Intravenous On call to O.R. 03/18/17 0857 03/18/17 1126       Patient was given sequential compression devices, early ambulation, and chemoprophylaxis to prevent DVT.  Patient benefited maximally from hospital stay and there were no complications.    Recent vital signs: Patient Vitals for the past 24 hrs:  BP Temp Temp src Pulse Resp SpO2  03/19/17 0615 128/79 98.6 F (37 C) Oral 76 16 98 %  03/19/17 0201 117/70 98.6 F (37 C) Oral 77 16 99 %  03/18/17 2208 137/76 98.1 F (36.7 C) Oral 75 16 100 %  03/18/17 1827 (!) 150/78 98.8 F (37.1 C) Oral 81 16 100 %  03/18/17 1711 (!) 149/82 97.7 F (36.5 C) Oral 77 16 100 %  03/18/17 1545 135/71 98 F (36.7 C) Oral 73 16 100 %  03/18/17 1447 140/87 97.9 F (36.6 C) - 75 16 100 %  03/18/17 1415 130/71 97.7 F (36.5 C) - 67 (!) 21 100 %  03/18/17 1400 131/65 - - 64 16 100 %  03/18/17 1345 136/77 - - 69 19 100 %  03/18/17 1330 (!) 147/81 - - 68 18 100 %  03/18/17 1248 140/78 98.2 F (36.8 C) - 84 19 100 %  03/18/17 1054 - - - 76 19 100 %  03/18/17 1052 - - - 76 14 100 %  03/18/17 1050 - - - 73 14 100 %  03/18/17 1048 (!) 116/58 - - 73 14  100 %  03/18/17 1046 - - - 74 14 100 %  03/18/17 1044 - - - 70 11 100 %  03/18/17 1042 - - - 70 - 100 %  03/18/17 1040 - - - 73 - 100 %  03/18/17 1038 - - - 64 11 100 %  03/18/17 1036 - - - 71 14 100 %  03/18/17 1034 (!) 160/87 - - 70 12 95 %  03/18/17 1032 - - - - 20 -     Recent laboratory studies: No results for input(s): WBC, HGB, HCT, PLT, NA, K, CL, CO2, BUN, CREATININE, GLUCOSE, INR, CALCIUM in the last 72 hours.  Invalid input(s): PT, 2   Discharge Medications:     Diagnostic Studies: Dg Knee Left Port  Result Date: 03/18/2017 CLINICAL DATA:  Total left knee arthroplasty revision. EXAM: PORTABLE LEFT KNEE - 1-2 VIEW COMPARISON:  01/03/2015 FINDINGS: Well seated components of a total knee arthroplasty. No  complicating features are demonstrated. IMPRESSION: Revision of a total knee arthroplasty with well seated components. Electronically Signed   By: Marijo Sanes M.D.   On: 03/18/2017 14:16    Disposition: 01-Home or Self Care  Discharge Instructions    Discharge patient    Complete by:  As directed    Discharge to home this afternoon following therapy.   Discharge disposition:  01-Home or Self Care   Discharge patient date:  03/19/2017      Follow-up Information    Mcarthur Rossetti, MD Follow up in 2 week(s).   Specialty:  Orthopedic Surgery Contact information: [place] [address] [phone]            Signed: Mcarthur Rossetti 03/19/2017, 10:31 AM

## 2017-03-19 NOTE — Progress Notes (Signed)
   03/19/17 1215  PT Visit Information  Last PT Received On 03/19/17  Assistance Needed +1  History of Present Illness s/p  synovectomy and poly-liner exchange L TKA PMHx: L TKA 2016, HTN  Subjective Data  Patient Stated Goal less knee pain  Precautions  Precautions Knee  Restrictions  LLE Weight Bearing WBAT  Pain Assessment  Pain Location left knee  Pain Descriptors / Indicators Burning  Cognition  Arousal/Alertness Awake/alert  Behavior During Therapy WFL for tasks assessed/performed  Overall Cognitive Status Within Functional Limits for tasks assessed  Bed Mobility  General bed mobility comments in chair  Transfers  Overall transfer level Needs assistance  Equipment used Rolling walker (2 wheeled)  Transfers Sit to/from Stand  Sit to Stand Supervision  General transfer comment cues for hand placement, overall safety  Ambulation/Gait  Ambulation/Gait assistance Supervision;Min guard  Assistive device Rolling walker (2 wheeled)  Gait Pattern/deviations Step-to pattern;Step-through pattern;Decreased stride length  General Gait Details cues for RW position  Stairs assistance Min guard;Supervision  Stair Management One rail Right;One rail Left;Step to pattern;Sideways  General stair comments cues for sequence  Total Joint Exercises  Ankle Circles/Pumps AROM;Both;10 reps  Quad Sets AROM;Both;10 reps  Heel Slides AAROM;Left;10 reps  Hip ABduction/ADduction AROM;Left;Strengthening;10 reps  Straight Leg Raises Limitations  Short Arc Quad AROM;Left;10 reps;Strengthening  Long Arc Quad AROM;Strengthening;10 reps  Straight Leg Raises Limitations back pain deferred  PT - End of Session  Activity Tolerance Patient tolerated treatment well  Patient left in chair;with call bell/phone within reach;with family/visitor present  PT - Assessment/Plan  PT Plan Current plan remains appropriate  PT Visit Diagnosis Difficulty in walking, not elsewhere classified (R26.2)  PT Frequency  (ACUTE ONLY) 7X/week  Follow Up Recommendations Home health PT  PT equipment None recommended by PT  AM-PAC PT "6 Clicks" Daily Activity Outcome Measure  Difficulty turning over in bed (including adjusting bedclothes, sheets and blankets)? 3  Difficulty moving from lying on back to sitting on the side of the bed?  3  Difficulty sitting down on and standing up from a chair with arms (e.g., wheelchair, bedside commode, etc,.)? 3  Help needed moving to and from a bed to chair (including a wheelchair)? 3  Help needed walking in hospital room? 3  Help needed climbing 3-5 steps with a railing?  3  6 Click Score 18  Mobility G Code  CK  Acute Rehab PT Goals  PT Goal Formulation With patient  Time For Goal Achievement 03/22/17  Potential to Achieve Goals Good  PT Time Calculation  PT Start Time (ACUTE ONLY) 1050  PT Stop Time (ACUTE ONLY) 1107  PT Time Calculation (min) (ACUTE ONLY) 17 min  PT G-Codes **NOT FOR INPATIENT CLASS**  Functional Assessment Tool Used AM-PAC 6 Clicks Basic Mobility;Clinical judgement  Functional Limitation Mobility: Walking and moving around  Mobility: Walking and Moving Around Goal Status (H1505) CI  Mobility: Walking and Moving Around Discharge Status (W9794) CI  PT General Charges  $$ ACUTE PT VISIT 1 Procedure  PT Treatments  $Therapeutic Exercise 8-22 mins

## 2017-03-19 NOTE — Evaluation (Signed)
Physical Therapy Evaluation Patient Details Name: [name] MRN: [id number] DOB: Jan 18, 1941 Today's Date: 03/19/2017   History of Present Illness  s/p  synovectomy and poly-liner exchange L TKA PMHx: L TKA 2016, HTN  Clinical Impression  Pt is s/p TKA resulting in the deficits listed below (see PT Problem List). Pt will benefit from skilled PT to increase their independence and safety with mobility to allow discharge to the venue listed below.      Follow Up Recommendations Home health PT    Equipment Recommendations  None recommended by PT    Recommendations for Other Services       Precautions / Restrictions Precautions Precautions: Knee Restrictions LLE Weight Bearing: Weight bearing as tolerated      Mobility  Bed Mobility               General bed mobility comments: on EOB on PT arrival  Transfers Overall transfer level: Needs assistance Equipment used: Rolling walker (2 wheeled) Transfers: Sit to/from Stand Sit to Stand: Min guard         General transfer comment: cues for hand placement, overall safety  Ambulation/Gait Ambulation/Gait assistance: Min guard Ambulation Distance (Feet): 160 Feet Assistive device: Rolling walker (2 wheeled) Gait Pattern/deviations: Step-to pattern;Step-through pattern;Decreased stride length        Stairs            Wheelchair Mobility    Modified Rankin (Stroke Patients Only)       Balance                                             Pertinent Vitals/Pain Pain Assessment: 0-10 Pain Score: 1  Pain Location: left knee Pain Descriptors / Indicators: Burning Pain Intervention(s): Limited activity within patient's tolerance;Monitored during session;Ice applied    Home Living Family/patient expects to be discharged to:: Private residence Living Arrangements: Spouse/significant other Available Help at Discharge: Family;Available 24 hours/day Type of Home: House Home Access:  Stairs to enter Entrance Stairs-Rails: Right Entrance Stairs-Number of Steps: 5 Home Layout: One level        Prior Function                 Hand Dominance        Extremity/Trunk Assessment   Upper Extremity Assessment Upper Extremity Assessment: Overall WFL for tasks assessed    Lower Extremity Assessment Lower Extremity Assessment: LLE deficits/detail LLE Deficits / Details: knee extension and hip flexion 3/5; ankle WFL; knee AAROM grossly 5* to 65* flexion       Communication      Cognition Arousal/Alertness: Awake/alert Behavior During Therapy: WFL for tasks assessed/performed Overall Cognitive Status: Within Functional Limits for tasks assessed                                        General Comments      Exercises     Assessment/Plan    PT Assessment Patient needs continued PT services  PT Problem List Decreased strength;Decreased range of motion;Decreased activity tolerance;Decreased balance;Decreased mobility;Decreased knowledge of use of DME;Decreased knowledge of precautions       PT Treatment Interventions DME instruction;Gait training;Stair training;Functional mobility training;Therapeutic exercise;Therapeutic activities    PT Goals (Current goals can be found in the Care Plan section)  Acute  Rehab PT Goals Patient Stated Goal: less knee pain PT Goal Formulation: With patient Time For Goal Achievement: 03/22/17 Potential to Achieve Goals: Good    Frequency 7X/week   Barriers to discharge        Co-evaluation               End of Session   Activity Tolerance: Patient tolerated treatment well Patient left: in chair;with call bell/phone within reach;with family/visitor present   PT Visit Diagnosis: Difficulty in walking, not elsewhere classified (R26.2)    Time: 1030-1040 PT Time Calculation (min) (ACUTE ONLY): 10 min   Charges:   PT Evaluation $PT Eval Low Complexity: 1 Procedure     PT G Codes:   PT  G-Codes **NOT FOR INPATIENT CLASS** Functional Assessment Tool Used: AM-PAC 6 Clicks Basic Mobility;Clinical judgement Functional Limitation: Mobility: Walking and moving around Mobility: Walking and Moving Around Current Status (B9038): At least 1 percent but less than 20 percent impaired, limited or restricted Mobility: Walking and Moving Around Goal Status [phone]): At least 1 percent but less than 20 percent impaired, limited or restricted      [facility] 03/19/2017, 12:06 PM

## 2017-03-19 NOTE — Progress Notes (Signed)
Discharged from floor via w/c for transport home by car. Spouse & belongings with pt. No changes in assessment. Phylisha Dix  

## 2017-03-19 NOTE — Progress Notes (Signed)
Subjective: 1 Day Post-Op Procedure(s) (LRB): LEFT KNEE PARTIAL SYNOVECTOMY WITH POLY EXCHANGE (Left) Patient reports pain as mild.    Objective: Vital signs in last 24 hours: Temp:  [97.7 F (36.5 C)-98.8 F (37.1 C)] 98.6 F (37 C) (04/21 0615) Pulse Rate:  [64-84] 76 (04/21 0615) Resp:  [11-21] 16 (04/21 0615) BP: (116-160)/(58-87) 128/79 (04/21 0615) SpO2:  [95 %-100 %] 98 % (04/21 0615)  Intake/Output from previous day: 04/20 0701 - 04/21 0700 In: 2725 [P.O.:720; I.V.:1900; IV Piggyback:105] Out: 2000 [Urine:1950; Blood:50] Intake/Output this shift: No intake/output data recorded.  No results for input(s): HGB in the last 72 hours. No results for input(s): WBC, RBC, HCT, PLT in the last 72 hours. No results for input(s): NA, K, CL, CO2, BUN, CREATININE, GLUCOSE, CALCIUM in the last 72 hours. No results for input(s): LABPT, INR in the last 72 hours.  Sensation intact distally Intact pulses distally Dorsiflexion/Plantar flexion intact Incision: no drainage No cellulitis present Compartment soft  Assessment/Plan: 1 Day Post-Op Procedure(s) (LRB): LEFT KNEE PARTIAL SYNOVECTOMY WITH POLY EXCHANGE (Left) Up with therapy Discharge home with home health today.  Mcarthur Rossetti 03/19/2017, 10:27 AM

## 2017-03-19 NOTE — Care Management Note (Signed)
Case Management Note  Patient Details  Name: EUNIQUE BALIK MRN: [id number] Date of Birth: 01/04/1941  Subjective/Objective:     Left TKA               Action/Plan: Discharge Planning: NCM spoke to husband. Pt participating in PT. Pt has RW, cane and shower chair at home. He will assist pt at home. [facility] was preoperatively arranged from surgeon's office. Husband agreeable to [facility] for [place].   PCP Shon Baton MD Expected Discharge Date:  03/19/17               Expected Discharge Plan:  [place]  In-House Referral:  NA  Discharge planning Services  CM Consult  Post Acute Care Choice:  Home Health Choice offered to:  Spouse  DME Arranged:  N/A DME Agency:  NA  HH Arranged:  PT [place] Agency:  [facility] ([facility]  Status of Service:  Completed, signed off  If discussed at [employer] of Stay Meetings, dates discussed:    Additional Comments:  Erenest Rasher, RN 03/19/2017, 10:36 AM

## 2017-03-21 LAB — BODY FLUID CULTURE: CULTURE: NO GROWTH

## 2017-03-21 LAB — ANAEROBIC CULTURE

## 2017-03-21 NOTE — Op Note (Signed)
NAME:  [name], [name] NO.:  MEDICAL RECORD NO.:  [id number]  LOCATION:                                 FACILITY:  PHYSICIAN:  Lind Guest. 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  PROCEDURES: 1. Partial synovectomy, left knee. 2. Polyethylene liner exchange, left knee.  IMPLANTS:  Removal of a size 9 Stryker polyethylene and upsized to a size 13 fixed-bearing polyethylene liner.  FINDINGS: 1. Mild synovitis and no evidence of infection. 2. No gross loosening of the metal prosthetic components.  SURGEON:  Lind Guest. Ninfa Linden, M.D.  ASSISTANT:  Erskine Emery, PA-C.  ANESTHESIA: 1. Left lower extremity block. 2. General.  ANTIBIOTICS:  2 g of IV Ancef.  BLOOD LOSS:  Less than 100 mL.  TOURNIQUET TIME:  Less than 1 hour.  COMPLICATIONS:  None.  INDICATIONS:  Ms. Marmo is a 76 year old female, well known to me. Two years ago, we performed a left total knee arthroplasty.  She had done well for a while, but then started developing some recurrent effusions.  She is a very petite individual and then felt that going up and down stairs, the knee was mechanically unstable and loose.  On my exam, I felt like she did have more play in her knee with the Drawer sign with a recurrent effusions and dissatisfaction with her knee at the present time.  We recommended an assessment of her knee under general anesthesia with a partial synovectomy and likely going up poly-liner sizes.  A thorough discussion of this was had with her and she did wish to proceed with surgery due to the instability symptoms of her knee.  PROCEDURE DESCRIPTION:  After informed  consent was obtained, appropriate left knee was marked.  Anesthesia obtained regional lower extremity block of the left leg.  She was brought to the operating room, placed supine on the operating table.  General anesthesia was then obtained.  A nonsterile tourniquet was placed around her upper left thigh and her left leg was prepped and draped from the thigh down the toes with DuraPrep and sterile drapes.  Time-out was called and she was identified as correct patient and correct left knee.  We then used an Esmarch to wrap out the leg and tourniquet was inflated to 300 mmHg.  We then went through her previous midline incision at the knee and dissected down the knee joint.  We carried out a medial parapatellar arthrotomy and today did not find a significant effusion, all was mild and some mild synovitis.  We performed a partial synovectomy in all 3 compartments of the knee and removed her old 9 mm polyethylene insert.  Of note, on her exam of her left knee, once she was asleep prior to prepping and draping, she did have a significant Drawer sign of her knee.  Once we removed the old polyethylene liner, we went up 2 sizes of polys from a  size 9 to a size 13.  With a size 13 trial insert in place, we were pleased with the range of motion and stability of the knee.  We then removed the trial liner and placed the real size 13 mm thickness fixed- bearing polyethylene insert for the Stryker Triathlon knee.  We again put the knee through range of motion.  We were pleased with stability. We irrigated the knee with 3 L normal saline solution using pulsatile lavage.  We let the tourniquet down.  Hemostasis was obtained with electrocautery.  We then closed the arthrotomy with interrupted #1 Vicryl suture followed by 0 Vicryl in the deep tissue, 2-0 Vicryl in the subcutaneous tissue, 4-0 Monocryl subcuticular stitch, and Steri-Strips on the skin.  She was then awakened, extubated, and taken to  the recovery room in stable condition.  All final counts were correct. There were no complications noted.  Of note, Erskine Emery, PAC, assisted in the entire case, his assistance was crucial for facilitating all aspects of this case.     Lind Guest. Ninfa Linden, M.D.     CYB/MEDQ  D:  03/18/2017  T:  03/18/2017  Job:  [id number]

## 2017-03-22 ENCOUNTER — Telehealth (INDEPENDENT_AMBULATORY_CARE_PROVIDER_SITE_OTHER): Payer: Self-pay | Admitting: Radiology

## 2017-03-22 NOTE — Telephone Encounter (Signed)
Needs orders for PT 2 times for 2 week 1 time for 1 week. Please call Gracie back to approve this at [phone].

## 2017-03-22 NOTE — Telephone Encounter (Signed)
Verbal order given  

## 2017-03-25 ENCOUNTER — Other Ambulatory Visit: Payer: Self-pay | Admitting: Cardiology

## 2017-03-25 NOTE — Telephone Encounter (Signed)
REFILL 

## 2017-04-01 ENCOUNTER — Ambulatory Visit: Payer: Medicare Other | Admitting: Gynecology

## 2017-04-04 ENCOUNTER — Ambulatory Visit (INDEPENDENT_AMBULATORY_CARE_PROVIDER_SITE_OTHER): Payer: Medicare Other | Admitting: Orthopaedic Surgery

## 2017-04-04 DIAGNOSIS — T84093D Other mechanical complication of internal left knee prosthesis, subsequent encounter: Secondary | ICD-10-CM

## 2017-04-04 NOTE — Progress Notes (Signed)
The patient is following up post a left knee polyethylene liner exchange. She is doing great. She has already been discharged from outpatient therapy. She feels like her balance is better and her leg lengths are normal. She feels like this is helped her back as well. She has no complaints.  On examination her incision looks great. I removed the old Steri-Strips and placed new ones. Her calf is soft. Range of motion is almost full of her knee. The swelling is minimal. The knee feels much more stable ligamentously that it did preoperatively.  At this point she'll continue increase her activities as comfort allows. I'll see her back for final visit in 4 weeks. No x-rays are needed.

## 2017-04-12 ENCOUNTER — Ambulatory Visit: Payer: Medicare Other

## 2017-04-13 ENCOUNTER — Other Ambulatory Visit: Payer: Medicare Other

## 2017-04-13 ENCOUNTER — Ambulatory Visit (HOSPITAL_BASED_OUTPATIENT_CLINIC_OR_DEPARTMENT_OTHER): Payer: Medicare Other

## 2017-04-13 DIAGNOSIS — Z452 Encounter for adjustment and management of vascular access device: Secondary | ICD-10-CM

## 2017-04-13 DIAGNOSIS — C7989 Secondary malignant neoplasm of other specified sites: Secondary | ICD-10-CM

## 2017-04-13 DIAGNOSIS — C561 Malignant neoplasm of right ovary: Secondary | ICD-10-CM | POA: Diagnosis not present

## 2017-04-13 DIAGNOSIS — Z95828 Presence of other vascular implants and grafts: Secondary | ICD-10-CM

## 2017-04-13 DIAGNOSIS — C569 Malignant neoplasm of unspecified ovary: Secondary | ICD-10-CM

## 2017-04-13 MED ORDER — SODIUM CHLORIDE 0.9% FLUSH
10.0000 mL | INTRAVENOUS | Status: DC | PRN
  Administered 2017-04-13: 10 mL via INTRAVENOUS
  Filled 2017-04-13: qty 10

## 2017-04-13 MED ORDER — HEPARIN SOD (PORK) LOCK FLUSH 100 UNIT/ML IV SOLN
500.0000 [IU] | Freq: Once | INTRAVENOUS | Status: AC
  Administered 2017-04-13: 500 [IU] via INTRAVENOUS
  Filled 2017-04-13: qty 5

## 2017-04-13 NOTE — Patient Instructions (Signed)

## 2017-04-14 ENCOUNTER — Telehealth: Payer: Self-pay

## 2017-04-14 LAB — CA 125: CANCER ANTIGEN (CA) 125: 24.8 U/mL (ref 0.0–38.1)

## 2017-04-14 NOTE — Telephone Encounter (Signed)
Pt called for ca 125 results. Result given.

## 2017-04-15 ENCOUNTER — Ambulatory Visit: Payer: Medicare Other | Attending: Gynecology | Admitting: Gynecology

## 2017-04-15 ENCOUNTER — Encounter: Payer: Self-pay | Admitting: Gynecology

## 2017-04-15 VITALS — BP 128/58 | HR 73 | Temp 97.7°F | Resp 20 | Wt 131.9 lb

## 2017-04-15 DIAGNOSIS — Z79899 Other long term (current) drug therapy: Secondary | ICD-10-CM | POA: Insufficient documentation

## 2017-04-15 DIAGNOSIS — Z79891 Long term (current) use of opiate analgesic: Secondary | ICD-10-CM | POA: Diagnosis not present

## 2017-04-15 DIAGNOSIS — Z9889 Other specified postprocedural states: Secondary | ICD-10-CM | POA: Diagnosis not present

## 2017-04-15 DIAGNOSIS — I252 Old myocardial infarction: Secondary | ICD-10-CM | POA: Diagnosis not present

## 2017-04-15 DIAGNOSIS — C569 Malignant neoplasm of unspecified ovary: Secondary | ICD-10-CM

## 2017-04-15 DIAGNOSIS — I10 Essential (primary) hypertension: Secondary | ICD-10-CM | POA: Diagnosis not present

## 2017-04-15 DIAGNOSIS — Z9221 Personal history of antineoplastic chemotherapy: Secondary | ICD-10-CM | POA: Insufficient documentation

## 2017-04-15 DIAGNOSIS — Z808 Family history of malignant neoplasm of other organs or systems: Secondary | ICD-10-CM | POA: Diagnosis not present

## 2017-04-15 DIAGNOSIS — Z8042 Family history of malignant neoplasm of prostate: Secondary | ICD-10-CM | POA: Diagnosis not present

## 2017-04-15 DIAGNOSIS — R971 Elevated cancer antigen 125 [CA 125]: Secondary | ICD-10-CM | POA: Diagnosis not present

## 2017-04-15 DIAGNOSIS — Z8543 Personal history of malignant neoplasm of ovary: Secondary | ICD-10-CM

## 2017-04-15 DIAGNOSIS — Z888 Allergy status to other drugs, medicaments and biological substances status: Secondary | ICD-10-CM | POA: Insufficient documentation

## 2017-04-15 DIAGNOSIS — Z85828 Personal history of other malignant neoplasm of skin: Secondary | ICD-10-CM | POA: Diagnosis not present

## 2017-04-15 DIAGNOSIS — Z8249 Family history of ischemic heart disease and other diseases of the circulatory system: Secondary | ICD-10-CM | POA: Insufficient documentation

## 2017-04-15 NOTE — Patient Instructions (Signed)
We'll schedule a CT scan of the abdomen and pelvis to reassess her disease status. Following the scan we will discuss management options.

## 2017-04-15 NOTE — Progress Notes (Signed)
Consult Note: Gyn-Onc   TYLESHA GIBEAULT 76 y.o. female  No chief complaint on file.   Assessment and plan :   Stage IIb poorly differentiated ovarian cancer Clinically free of disease  but with a rising CA-125. The patient is recently had knee surgery and has had some cardiac issues as well as dental surgery which may lead to a falsely positive CA-125. Nonetheless I recommended to the patient and she is in agreement that we obtain a CT scan of the abdomen and pelvis to reassess her disease status.    Interval History:   The patient returns today as previously scheduled.  Overall the patient feels very well. Specifically, she  has no abdominal or pelvic symptoms. She has some chronic constipation. Otherwise she has no GI GU or pelvic symptoms.  Unfortunately, a recent CA-125 value returned as 24.8 units per mL. Previously her levels were 14, 18 and 14 units per mL.  The patient has recently had knee surgery, cardiology management, and oral surgery. She is anticipating a family [place] week in June.  HPI:The patient initially presented with a pelvic mass and elevated CA 125 (178 units per mL) She underwent exploratory laparotomy and debulking on 02/13/2013. Final pathology showed a poorly differentiated ovarian cancer involving both ovaries pelvic peritoneum and rectal muscularis. All gross disease was resected.  She then received 6 cycles of carboplatin and Taxol chemotherapy completed in September 2014. At the completion of chemotherapy her CA 125 was 18 units per mL.   In May 2016 the patient CA-125 began to rise and a PET CT scan showed metastatic disease in the spleen and pelvic lymph nodes. Given the long platinum free interval , we retreated with carboplatin, Taxol, and a Avastin. She received 6 cycles of carboplatin and Taxol and a Avastin the last being administered in September 2016. She had a significant response with CA 125 falling to 12 units per mL in September. CT scan showed  persistent disease in the spleen (which was actually responding). Patient received a total 8 cycles and on follow-up in December 2016 had persistent disease in the spleen which had slightly decreased in size. At that time CA-125 is 14 units per mL.  In March 2017 we switch the patient to letrozole.   A CT scan obtained 06/23/2016 showed a response with the spleen met measure 1.5 x 1.5 cm (previously 2.4 x 3.9 cm). Because of side effects resolve was discontinued in November 2017. A follow-up CT scan on 12/01/2016 showed no evidence of the splenic lesion and her CA-125 at that time was 14 units per mL.   Review of Systems:10 point review of systems is negative except as noted in interval history.   Vitals: Blood pressure (!) 128/58, pulse 73, temperature 97.7 F (36.5 C), temperature source Oral, resp. rate 20, weight 131 lb 14.4 oz (59.8 kg).  Physical Exam: General : The patient is a healthy woman in no acute distress.  HEENT: normocephalic, extraoccular movements normal; neck is supple without thyromegally , Port-A-Cath appears healthy is no evidence of infection in the right chest wall. Lynphnodes: Supraclavicular and inguinal nodes not enlarged  Abdomen: Soft, non-tender, no ascites, no organomegally, no masses, no hernias , midline incision is healing well  Pelvic:    EGBUS: Normal female  Vagina bladder urethra: Normal  Vaginal cuff is well healed.  Cervix and uterus are surgically absent  Bimanual exam: No masses nodularity or fullness.  Rectovaginal exam confirms      Lower extremities:  No edema or varicosities. Normal range of motion      Allergies  Allergen Reactions  . Clonidine Derivatives Shortness Of Breath  . Coreg [Carvedilol] Shortness Of Breath  . Caffeine Other (See Comments)    Makes heart race  . Codeine Other (See Comments)    Does not like the feeling she gets  . Flexeril [Cyclobenzaprine] Other (See Comments)    Pt states "increased heart rate"  .  Lipitor [Atorvastatin] Dermatitis    "feels like bugs are biting her"   . Lisinopril     LIP NUMBNESS  . Pravastatin Other (See Comments)    FEELS LIKE "BUG BITES" BITING HER LEGS   . Tape Rash    TEGADERM.   (use opsite on PAC)  . Tegaderm Ag Mesh [Silver] Rash and Other (See Comments)    Burns skin  . Ultram [Tramadol] Palpitations    Past Medical History:  Diagnosis Date  . Arthritis    OSTEOARTHRITIS   -- CONSTANT PAIN RIGHT HIP---AND PAIN LEFT KNEE--PT STATES SHE GETS INJECTIONS INTO HER KNEE  . Complication of anesthesia    BLOOD PRESSURE DROPPED WITH NASAL SURGERY, ONE OF THE CARPAL TUNNEL REPAIRS AND DURING A COLONOSCOPY  . Dyslipidemia   . History of skin cancer   . Hypertension   . Hypothyroidism   . Menopausal symptoms   . NSTEMI (non-ST elevated myocardial infarction) ([place]) 12/09/15   Medical management: Distal branch of D1 95%, ostial D2 75%, distal LAD 50%. Tortuous arteries consistent with hypertension  . Ovarian cancer ([place]) 01/2013   Recurrence since 2014/2016  . PAF (paroxysmal atrial fibrillation) ([place])    On ELIQUIS; On Amiodarone (Eye Exam 12/25/15) - no longer on Rythmol    Past Surgical History:  Procedure Laterality Date  . BILATERAL CARPAL TUNNEL REPAIR  2007  . CARDIAC CATHETERIZATION N/A 12/05/2015   Procedure: Left Heart Cath and Coronary Angiography;  Surgeon: Belva Crome, MD;  Location: [facility];  Service: Cardiovascular;  distal branch of D1 95%, ostial D2 75%, dLAD 50%,p-mRCA 40%. Tortuous vessels.  Marland Kitchen DILATION AND CURETTAGE OF UTERUS  1969  . LAPAROTOMY Bilateral 02/13/2013   Procedure: EXPLORATORY LAPAROTOMY TOTAL ABDOMINAL HYSTERECTOMY BILATERAL SALPINGO-OOPHORECTOMY, Partial Rectal Resection with Reanastamosis;  Surgeon: Alvino Chapel, MD;  Location: [facility];  Service: Gynecology;  Laterality: Bilateral;  . LEFT KNEE ARTHROSCOPY   2011  . LYMPHADENECTOMY Right 02/13/2013   Procedure: PEVLIC  LYMPHADENECTOMY, DEBULKING right  pelvic tumor nodules;  Surgeon: Alvino Chapel, MD;  Location: [facility];  Service: Gynecology;  Laterality: Right;  . NM MYOVIEW LTD  June 2010    subbmaximal with no ischemia or infarction.  . OMENTECTOMY  02/13/2013   Procedure: OMENTECTOMY;  Surgeon: Alvino Chapel, MD;  Location: [facility];  Service: Gynecology;;  . [place]  . SURGERY FOR RUPTURED OVARIAN CYST  1969  . SYNOVECTOMY WITH POLY EXCHANGE Left 03/18/2017   Procedure: LEFT KNEE PARTIAL SYNOVECTOMY WITH POLY EXCHANGE;  Surgeon: Mcarthur Rossetti, MD;  [facility]: [facility];  Service: Orthopedics;  Laterality: Left;  Adductor Block  . TAH/BSO/Tumor debulking with right pelvic LND  01/2013  . TONSILLECTOMY  1962  . TOTAL HIP ARTHROPLASTY  02/25/2012   Procedure: TOTAL HIP ARTHROPLASTY ANTERIOR APPROACH;  Surgeon: Mcarthur Rossetti, MD;  [facility];  Service: Orthopedics;  Laterality: Right;  . TOTAL KNEE ARTHROPLASTY Left 01/03/2015   Procedure: LEFT TOTAL KNEE ARTHROPLASTY;  Surgeon: Mcarthur Rossetti,  MD;  [facility];  Service: Orthopedics;  Laterality: Left;  . TRANSTHORACIC ECHOCARDIOGRAM  May 2014; January 2016   a. Normal LV size function. EF 60-65%. Grade 1 diastolic function. Mild MR and mildly elevated PA pressures of 37 mmHg per;; b. EF 60-65%. Mobile echodensity 10 mm x 6 mm attest interventricular septum is questionable fibroblastoma.    Current Outpatient Prescriptions  Medication Sig Dispense Refill  . acetaminophen (TYLENOL) 500 MG tablet Take 1,000 mg by mouth every 6 (six) hours as needed for mild pain.     Marland Kitchen amiodarone (PACERONE) 200 MG tablet TAKE 1/2 TABLET BY MOUTH DAILY 45 tablet 3  . amoxicillin (AMOXIL) 500 MG capsule Take 2,000 mg by mouth See admin instructions. Takes 4 capsules 1 hour prior to procedure    . Biotin 10 MG TABS Take 10 mg by mouth daily.     Marland Kitchen desonide (DESOWEN) 0.05 % cream Apply 1 application topically 2 (two) times daily.   1  .  diphenhydrAMINE (BENADRYL) 25 MG tablet Take 50 mg by mouth at bedtime as needed for sleep.     Marland Kitchen ELIQUIS 5 MG TABS tablet TAKE 1 TABLET (5 MG TOTAL) BY MOUTH 2 (TWO) TIMES DAILY. 60 tablet 5  . Evolocumab with Infusor ([place]) 420 MG/3.5ML SOCT Inject 420 mg into the skin every 30 (thirty) days. 1 Cartridge 12  . ezetimibe (ZETIA) 10 MG tablet Take 1 tablet (10 mg total) by mouth daily. (Patient taking differently: Take 10 mg by mouth at bedtime. ) 30 tablet 6  . Glucosamine HCl 1000 MG TABS Take 1,000 mg by mouth daily.    . hydrocortisone 1 % ointment Apply 1 application topically 2 (two) times daily as needed for itching (eczema).     Marland Kitchen ipratropium (ATROVENT) 0.06 % nasal spray Place 2 sprays into both nostrils daily.   5  . irbesartan (AVAPRO) 300 MG tablet TAKE 1 TABLET (300 MG TOTAL) BY MOUTH DAILY. 90 tablet 1  . levothyroxine (SYNTHROID) 88 MCG tablet Take 88 mcg by mouth daily before breakfast.    . lidocaine-prilocaine (EMLA) cream Apply to Porta-Cath site 1-2 hours prior to access as directed. (Patient taking differently: Apply 1 application topically See admin instructions. Apply to [facility] site 1-2 hours prior to access as directed.) 30 g 2  . magnesium hydroxide (MILK OF MAGNESIA) 800 MG/5ML suspension Take 30 mLs by mouth daily as needed for constipation. Reported on 11/20/2015    . Melatonin 5 MG CAPS Take 5 mg by mouth at bedtime as needed (sleep). Reported on 12/25/2015    . Menthol, Topical Analgesic, (ICY HOT EX) Apply 1 application topically 2 (two) times daily as needed (PAIN).     Marland Kitchen methocarbamol (ROBAXIN) 500 MG tablet Take 1 tablet (500 mg total) by mouth every 6 (six) hours as needed for muscle spasms. 60 tablet 0  . Misc Natural Products (TART CHERRY ADVANCED) CAPS Take 1 capsule by mouth daily. 1200 mg per capsule    . Multiple Vitamin (MULTIVITAMIN WITH MINERALS) TABS tablet Take 1 tablet by mouth daily. Centrum Silver    . Polyethyl Glycol-Propyl  Glycol (SYSTANE OP) Place 1 drop into both eyes 2 (two) times daily.     . polyethylene glycol (MIRALAX / GLYCOLAX) packet Take 17 g by mouth daily. Mix in 8 oz liquid and drink    . potassium chloride (K-DUR) 10 MEQ tablet Take 10 mEq by mouth every Monday, Wednesday, and Friday.    Marland Kitchen spironolactone (  ALDACTONE) 25 MG tablet Take 25 mg by mouth See admin instructions. Takes on Tuesdays, Thursday, Saturday, Sunday    . traMADol (ULTRAM) 50 MG tablet Take 1-2 tablets (50-100 mg total) by mouth every 6 (six) hours as needed. 40 tablet 0   No current facility-administered medications for this visit.     Social History   Social History  . Marital status: Married    Spouse name: N/A  . Number of children: N/A  . Years of education: N/A   Occupational History  . Not on file.   Social History Main Topics  . Smoking status: Never Smoker  . Smokeless tobacco: Never Used  . Alcohol use No  . Drug use: No  . Sexual activity: Not Currently   Other Topics Concern  . Not on file   Social History Narrative   Married mother of 3, grandmother 34.   Exercises 6/7 days a week walking 30 minutes a time.   Never smoked or drank alcohol.    Family History  Problem Relation Age of Onset  . Hypertension Mother   . Basal cell carcinoma Mother   . AAA (abdominal aortic aneurysm) Father   . Heart Problems Maternal Uncle   . Heart Problems Maternal Grandfather   . AAA (abdominal aortic aneurysm) Paternal Grandmother   . Prostate cancer Maternal Uncle 70  . Prostate cancer Other   . Other Daughter        one daughter had TAH-BSO at 40; other daughter will have one soon      Marti Sleigh, MD 04/15/2017, 9:27 AM       Consult Note: Gyn-Onc   [name] 76 y.o. female  No chief complaint on file.   Assessment and plan :   Stage IIb poorly differentiated ovarian cancer currently responding to therapy with carboplatin , Taxol, and a Avastin. Poorly controlled  hypertension.  The most recent CT scan is reviewed with the patient and her husband. It appears that there is continued but slight decrease size of the metastasis in the spleen. On the most recent report there is also some question about adenopathy and resolution of possible peritoneal implants in the pelvis. This is a different interpretation than what I had seen in the prior CT scan report and raises the question as to whether the patient might have other metastatic disease. Therefore, we'll obtain a PET scan to further evaluate whether the lesion in the spleen is solitary or whether there are other metastatic sites. Clearly if there are other metastatic sites I would not recommend splenectomy. But, rather, would consider changing chemotherapy to another regimen.  We will schedule the PET scan and allow the patient have a therapeutic holiday over the Christmas holidays. She has been very happy to have time off of chemotherapy.  Interval History:   The patient returns today as previously scheduled.  Since her last visit she's received 2 additional cycles of carboplatin, Taxol, and a Avastin. We had planned 3 cycles but the third is been held because the patient's hypertension. Further, the patient reports that after each cycle she's becoming more more fatigued and more nausea and quality of life has diminished considerably.  She had a CT scan to reassess her disease status on December 8. This noted that the lesion in the spleen has had "mild" decrease in size. Currently the lesion measures 3.5 x 3.2 cm (previously 3.5 x 3.9 cm. In addition, the radiologist reports that peritoneal implants within the pelvis are no  longer measurable and that there is resolution of previously enlarged right external iliac node. In addition there is a right hilar node which is borderline enlarged and increased in size when compared to scan of May 2014. Patient's most recent CA-125 is 14 (previously 12) Otherwise she has no  abdominal or pelvic symptoms.  HPI:The patient initially presented with a pelvic mass and elevated CA 125 (178 units per mL) She underwent exploratory laparotomy and debulking on 02/13/2013. Final pathology showed a poorly differentiated ovarian cancer involving both ovaries pelvic peritoneum and rectal muscularis. All gross disease was resected.  She then received 6 cycles of carboplatin and Taxol chemotherapy completed in September 2014. At the completion of chemotherapy her CA 125 was 18 units per mL.   In May 2016 the patient CA-125 began to rise and a PET CT scan showed metastatic disease in the spleen and pelvic lymph nodes. Given the long platinum free interval , we retreated with carboplatin, Taxol, and a Avastin. She received 6 cycles of carboplatin and Taxol and a Avastin the last being administered in September 2016. She had a significant response with CA 125 falling to 12 units per mL in September. CT scan showed persistent disease in the spleen (which was actually responding). Patient received a total 8 cycles and on follow-up in December 2016 had persistent disease in the spleen which had slightly decreased in size. At that time CA-125 is 14 units per mL.   Review of Systems:10 point review of systems is negative except as noted in interval history.   Vitals: Blood pressure (!) 128/58, pulse 73, temperature 97.7 F (36.5 C), temperature source Oral, resp. rate 20, weight 131 lb 14.4 oz (59.8 kg).  Physical Exam: General : The patient is a healthy woman in no acute distress.  HEENT: normocephalic, extraoccular movements normal; neck is supple without thyromegally  Lynphnodes: Supraclavicular and inguinal nodes not enlarged  Abdomen: Soft, non-tender, no ascites, no organomegally, no masses, no hernias , midline incision is healing well  Pelvic:    EGBUS: Normal female  Vagina bladder urethra: Normal  Vaginal cuff is well healed.  Cervix and uterus are surgically  absent  Bimanual exam: No masses nodularity or fullness.  Rectovaginal exam confirms      Lower extremities: No edema or varicosities. Normal range of motion      Allergies  Allergen Reactions  . Clonidine Derivatives Shortness Of Breath  . Coreg [Carvedilol] Shortness Of Breath  . Caffeine Other (See Comments)    Makes heart race  . Codeine Other (See Comments)    Does not like the feeling she gets  . Flexeril [Cyclobenzaprine] Other (See Comments)    Pt states "increased heart rate"  . Lipitor [Atorvastatin] Dermatitis    "feels like bugs are biting her"   . Lisinopril     LIP NUMBNESS  . Pravastatin Other (See Comments)    FEELS LIKE "BUG BITES" BITING HER LEGS   . Tape Rash    TEGADERM.   (use opsite on PAC)  . Tegaderm Ag Mesh [Silver] Rash and Other (See Comments)    Burns skin  . Ultram [Tramadol] Palpitations    Past Medical History:  Diagnosis Date  . Arthritis    OSTEOARTHRITIS   -- CONSTANT PAIN RIGHT HIP---AND PAIN LEFT KNEE--PT STATES SHE GETS INJECTIONS INTO HER KNEE  . Complication of anesthesia    BLOOD PRESSURE DROPPED WITH NASAL SURGERY, ONE OF THE CARPAL TUNNEL REPAIRS AND DURING A COLONOSCOPY  . Dyslipidemia   .  History of skin cancer   . Hypertension   . Hypothyroidism   . Menopausal symptoms   . NSTEMI (non-ST elevated myocardial infarction) ([place]) 12/09/15   Medical management: Distal branch of D1 95%, ostial D2 75%, distal LAD 50%. Tortuous arteries consistent with hypertension  . Ovarian cancer ([place]) 01/2013   Recurrence since 2014/2016  . PAF (paroxysmal atrial fibrillation) ([place])    On ELIQUIS; On Amiodarone (Eye Exam 12/25/15) - no longer on Rythmol    Past Surgical History:  Procedure Laterality Date  . BILATERAL CARPAL TUNNEL REPAIR  2007  . CARDIAC CATHETERIZATION N/A 12/05/2015   Procedure: Left Heart Cath and Coronary Angiography;  Surgeon: Belva Crome, MD;  Location: [facility];  Service: Cardiovascular;  distal branch  of D1 95%, ostial D2 75%, dLAD 50%,p-mRCA 40%. Tortuous vessels.  Marland Kitchen DILATION AND CURETTAGE OF UTERUS  1969  . LAPAROTOMY Bilateral 02/13/2013   Procedure: EXPLORATORY LAPAROTOMY TOTAL ABDOMINAL HYSTERECTOMY BILATERAL SALPINGO-OOPHORECTOMY, Partial Rectal Resection with Reanastamosis;  Surgeon: Alvino Chapel, MD;  [facility];  Service: Gynecology;  Laterality: Bilateral;  . LEFT KNEE ARTHROSCOPY   2011  . LYMPHADENECTOMY Right 02/13/2013   Procedure: PEVLIC  LYMPHADENECTOMY, DEBULKING right pelvic tumor nodules;  Surgeon: Alvino Chapel, MD;  [facility];  Service: Gynecology;  Laterality: Right;  . NM MYOVIEW LTD  June 2010    subbmaximal with no ischemia or infarction.  . OMENTECTOMY  02/13/2013   Procedure: OMENTECTOMY;  Surgeon: Alvino Chapel, MD;  [facility];  Service: Gynecology;;  . [place]  . SURGERY FOR RUPTURED OVARIAN CYST  1969  . SYNOVECTOMY WITH POLY EXCHANGE Left 03/18/2017   Procedure: LEFT KNEE PARTIAL SYNOVECTOMY WITH POLY EXCHANGE;  Surgeon: Mcarthur Rossetti, MD;  [facility];  Service: Orthopedics;  Laterality: Left;  Adductor Block  . TAH/BSO/Tumor debulking with right pelvic LND  01/2013  . TONSILLECTOMY  1962  . TOTAL HIP ARTHROPLASTY  02/25/2012   Procedure: TOTAL HIP ARTHROPLASTY ANTERIOR APPROACH;  Surgeon: Mcarthur Rossetti, MD;  [facility];  Service: Orthopedics;  Laterality: Right;  . TOTAL KNEE ARTHROPLASTY Left 01/03/2015   Procedure: LEFT TOTAL KNEE ARTHROPLASTY;  Surgeon: Mcarthur Rossetti, MD;  [facility];  Service: Orthopedics;  Laterality: Left;  . TRANSTHORACIC ECHOCARDIOGRAM  May 2014; January 2016   a. Normal LV size function. EF 60-65%. Grade 1 diastolic function. Mild MR and mildly elevated PA pressures of 37 mmHg per;; b. EF 60-65%. Mobile echodensity 10 mm x 6 mm attest interventricular septum is questionable fibroblastoma.    Current Outpatient  Prescriptions  Medication Sig Dispense Refill  . acetaminophen (TYLENOL) 500 MG tablet Take 1,000 mg by mouth every 6 (six) hours as needed for mild pain.     Marland Kitchen amiodarone (PACERONE) 200 MG tablet TAKE 1/2 TABLET BY MOUTH DAILY 45 tablet 3  . amoxicillin (AMOXIL) 500 MG capsule Take 2,000 mg by mouth See admin instructions. Takes 4 capsules 1 hour prior to procedure    . Biotin 10 MG TABS Take 10 mg by mouth daily.     Marland Kitchen desonide (DESOWEN) 0.05 % cream Apply 1 application topically 2 (two) times daily.   1  . diphenhydrAMINE (BENADRYL) 25 MG tablet Take 50 mg by mouth at bedtime as needed for sleep.     Marland Kitchen ELIQUIS 5 MG TABS tablet TAKE 1 TABLET (5 MG TOTAL) BY MOUTH 2 (TWO) TIMES DAILY. 60 tablet 5  .  Evolocumab with Infusor ([place]) 420 MG/3.5ML SOCT Inject 420 mg into the skin every 30 (thirty) days. 1 Cartridge 12  . ezetimibe (ZETIA) 10 MG tablet Take 1 tablet (10 mg total) by mouth daily. (Patient taking differently: Take 10 mg by mouth at bedtime. ) 30 tablet 6  . Glucosamine HCl 1000 MG TABS Take 1,000 mg by mouth daily.    . hydrocortisone 1 % ointment Apply 1 application topically 2 (two) times daily as needed for itching (eczema).     Marland Kitchen ipratropium (ATROVENT) 0.06 % nasal spray Place 2 sprays into both nostrils daily.   5  . irbesartan (AVAPRO) 300 MG tablet TAKE 1 TABLET (300 MG TOTAL) BY MOUTH DAILY. 90 tablet 1  . levothyroxine (SYNTHROID) 88 MCG tablet Take 88 mcg by mouth daily before breakfast.    . lidocaine-prilocaine (EMLA) cream Apply to Porta-Cath site 1-2 hours prior to access as directed. (Patient taking differently: Apply 1 application topically See admin instructions. Apply to [facility] site 1-2 hours prior to access as directed.) 30 g 2  . magnesium hydroxide (MILK OF MAGNESIA) 800 MG/5ML suspension Take 30 mLs by mouth daily as needed for constipation. Reported on 11/20/2015    . Melatonin 5 MG CAPS Take 5 mg by mouth at bedtime as needed (sleep).  Reported on 12/25/2015    . Menthol, Topical Analgesic, (ICY HOT EX) Apply 1 application topically 2 (two) times daily as needed (PAIN).     Marland Kitchen methocarbamol (ROBAXIN) 500 MG tablet Take 1 tablet (500 mg total) by mouth every 6 (six) hours as needed for muscle spasms. 60 tablet 0  . Misc Natural Products (TART CHERRY ADVANCED) CAPS Take 1 capsule by mouth daily. 1200 mg per capsule    . Multiple Vitamin (MULTIVITAMIN WITH MINERALS) TABS tablet Take 1 tablet by mouth daily. Centrum Silver    . Polyethyl Glycol-Propyl Glycol (SYSTANE OP) Place 1 drop into both eyes 2 (two) times daily.     . polyethylene glycol (MIRALAX / GLYCOLAX) packet Take 17 g by mouth daily. Mix in 8 oz liquid and drink    . potassium chloride (K-DUR) 10 MEQ tablet Take 10 mEq by mouth every Monday, Wednesday, and Friday.    Marland Kitchen spironolactone (ALDACTONE) 25 MG tablet Take 25 mg by mouth See admin instructions. Takes on Tuesdays, Thursday, Saturday, Sunday    . traMADol (ULTRAM) 50 MG tablet Take 1-2 tablets (50-100 mg total) by mouth every 6 (six) hours as needed. 40 tablet 0   No current facility-administered medications for this visit.     Social History   Social History  . Marital status: Married    Spouse name: N/A  . Number of children: N/A  . Years of education: N/A   Occupational History  . Not on file.   Social History Main Topics  . Smoking status: Never Smoker  . Smokeless tobacco: Never Used  . Alcohol use No  . Drug use: No  . Sexual activity: Not Currently   Other Topics Concern  . Not on file   Social History Narrative   Married mother of 3, grandmother 45.   Exercises 6/7 days a week walking 30 minutes a time.   Never smoked or drank alcohol.    Family History  Problem Relation Age of Onset  . Hypertension Mother   . Basal cell carcinoma Mother   . AAA (abdominal aortic aneurysm) Father   . Heart Problems Maternal Uncle   . Heart Problems Maternal Grandfather   .  AAA (abdominal aortic  aneurysm) Paternal Grandmother   . Prostate cancer Maternal Uncle 70  . Prostate cancer Other   . Other Daughter        one daughter had TAH-BSO at 58; other daughter will have one soon      Marti Sleigh, MD 04/15/2017, 9:27 AM

## 2017-04-19 ENCOUNTER — Encounter (HOSPITAL_COMMUNITY): Payer: Self-pay

## 2017-04-19 ENCOUNTER — Ambulatory Visit (HOSPITAL_COMMUNITY)
Admission: RE | Admit: 2017-04-19 | Discharge: 2017-04-19 | Disposition: A | Discharge status: Home / Self Care | Payer: Medicare Other | Source: Ambulatory Visit | Attending: Gynecologic Oncology | Admitting: Gynecologic Oncology

## 2017-04-19 DIAGNOSIS — K669 Disorder of peritoneum, unspecified: Secondary | ICD-10-CM | POA: Insufficient documentation

## 2017-04-19 DIAGNOSIS — I700 Atherosclerosis of aorta: Secondary | ICD-10-CM | POA: Insufficient documentation

## 2017-04-19 DIAGNOSIS — Z96641 Presence of right artificial hip joint: Secondary | ICD-10-CM | POA: Insufficient documentation

## 2017-04-19 DIAGNOSIS — C569 Malignant neoplasm of unspecified ovary: Secondary | ICD-10-CM | POA: Insufficient documentation

## 2017-04-19 MED ORDER — HEPARIN SOD (PORK) LOCK FLUSH 100 UNIT/ML IV SOLN
500.0000 [IU] | Freq: Once | INTRAVENOUS | Status: AC
  Administered 2017-04-19: 500 [IU] via INTRAVENOUS

## 2017-04-19 MED ORDER — IOPAMIDOL (ISOVUE-300) INJECTION 61%
100.0000 mL | Freq: Once | INTRAVENOUS | Status: AC | PRN
  Administered 2017-04-19: 100 mL via INTRAVENOUS

## 2017-04-22 ENCOUNTER — Other Ambulatory Visit: Payer: Self-pay | Admitting: Gynecologic Oncology

## 2017-04-22 DIAGNOSIS — C569 Malignant neoplasm of unspecified ovary: Secondary | ICD-10-CM

## 2017-04-22 MED ORDER — EXEMESTANE 25 MG PO TABS: 25.0000 mg | ORAL_TABLET | Freq: Every day | ORAL | 6 refills | Status: DC

## 2017-04-22 NOTE — Progress Notes (Signed)
Aromasin 25 mg tablet once daily ordered per Dr. Fermin Schwab.

## 2017-04-26 ENCOUNTER — Ambulatory Visit
Admission: RE | Admit: 2017-04-26 | Discharge: 2017-04-26 | Disposition: A | Discharge status: Home / Self Care | Payer: Medicare Other | Source: Ambulatory Visit | Attending: Internal Medicine | Admitting: Internal Medicine

## 2017-04-26 DIAGNOSIS — Z1231 Encounter for screening mammogram for malignant neoplasm of breast: Secondary | ICD-10-CM

## 2017-04-29 ENCOUNTER — Ambulatory Visit: Payer: Medicare Other | Admitting: Gynecology

## 2017-04-29 ENCOUNTER — Telehealth: Payer: Self-pay | Admitting: *Deleted

## 2017-04-29 NOTE — Telephone Encounter (Signed)
Contacted the patient regarding the start of aromasin. Patient stated that "I will start that on Monday." Per Dr.Clarke-Pearson, patient to follow up on August 3rd. Appt time/date given to the patient of August 3rd at 10:30am; arrive at 10:15am. Patient also to have flush/lab appt on August 1st.

## 2017-05-03 ENCOUNTER — Ambulatory Visit (INDEPENDENT_AMBULATORY_CARE_PROVIDER_SITE_OTHER): Payer: Medicare Other | Admitting: Physician Assistant

## 2017-05-03 DIAGNOSIS — T84093D Other mechanical complication of internal left knee prosthesis, subsequent encounter: Secondary | ICD-10-CM

## 2017-05-03 NOTE — Progress Notes (Signed)
[name] returns today 45 days status post left knee partial synovectomy with polyethylene exchange left total knee. She is overall doing very well. She is very pleased with the results. She states she can do stairs or normal female only complaint is some swelling and stiffness. 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  Physical exam: Left knee she has full extension and full flexion without pain no instability valgus/ varus stressing. Anterior drawer is negative. Surgical incisions healing well no signs of infection left calf supple nontender. Minimal edema about the knee without effusion.  Plan she'll continue work on range of motion strengthening left knee. We'll see her back in April 2019 and let she has problems or concerns prior to then. AP and lateral views of the left knee at that time.

## 2017-05-25 ENCOUNTER — Ambulatory Visit (HOSPITAL_BASED_OUTPATIENT_CLINIC_OR_DEPARTMENT_OTHER): Payer: Medicare Other

## 2017-05-25 DIAGNOSIS — Z95828 Presence of other vascular implants and grafts: Secondary | ICD-10-CM

## 2017-05-25 DIAGNOSIS — C7989 Secondary malignant neoplasm of other specified sites: Secondary | ICD-10-CM

## 2017-05-25 DIAGNOSIS — Z452 Encounter for adjustment and management of vascular access device: Secondary | ICD-10-CM

## 2017-05-25 DIAGNOSIS — C561 Malignant neoplasm of right ovary: Secondary | ICD-10-CM

## 2017-05-25 DIAGNOSIS — C569 Malignant neoplasm of unspecified ovary: Secondary | ICD-10-CM

## 2017-05-25 MED ORDER — HEPARIN SOD (PORK) LOCK FLUSH 100 UNIT/ML IV SOLN
500.0000 [IU] | Freq: Once | INTRAVENOUS | Status: AC
  Administered 2017-05-25: 500 [IU]
  Filled 2017-05-25: qty 5

## 2017-05-25 MED ORDER — SODIUM CHLORIDE 0.9% FLUSH
10.0000 mL | Freq: Once | INTRAVENOUS | Status: AC
  Administered 2017-05-25: 10 mL
  Filled 2017-05-25: qty 10

## 2017-05-30 ENCOUNTER — Telehealth: Payer: Self-pay | Admitting: Cardiology

## 2017-05-30 NOTE — Telephone Encounter (Signed)
Spoke with pt, this is the first time she has checked her bp, she said she felt so bad yesterday that she started checking it. Her bp currently is 127/76. She will make sure to increase her fluids today. She will try taking the irbesartan in the evening and the spironolactone in the evening. She will call back with further issues.

## 2017-05-30 NOTE — Telephone Encounter (Signed)
Pt c/o BP issue: STAT if pt c/o blurred vision, one-sided weakness or slurred speech  1. What are your last 5 BP readings? "8:30 am 104/69  15 mins later 105/60 something  And now 95/60 something"  2. Are you having any other symptoms (ex. Dizziness, headache, blurred vision, passed out)? No, I just feel bad and tired.  3. What is your BP issue? Low

## 2017-06-06 ENCOUNTER — Other Ambulatory Visit: Payer: Self-pay | Admitting: Cardiology

## 2017-06-06 MED ORDER — AMIODARONE HCL 100 MG PO TABS: 100.0000 mg | ORAL_TABLET | Freq: Every day | ORAL | 1 refills | Status: DC

## 2017-06-06 NOTE — Telephone Encounter (Signed)
New rx sent. Pt notified

## 2017-06-06 NOTE — Telephone Encounter (Signed)
Pt would like for Dr Ellyn Hack to change her Amiodarone 200 mg to 100 mg so she will not have to cut them in half please.Her [facility] is [facility] on [address] in [place].

## 2017-06-16 ENCOUNTER — Telehealth: Payer: Self-pay | Admitting: Pharmacist Clinician (PhC)/ Clinical Pharmacy Specialist

## 2017-06-16 NOTE — Telephone Encounter (Signed)
Pt not sure she still wants to use Repatha.  Encouraged her to contact Patient Assistance, as they will give it at N/C for the remainder of 2018.  She has also heard that Livalo may be less irritating to muscle and thinks maybe she should try that.   Encouraged her to think about her options, but let [employer] know within the next week or two otherwise they will close her file

## 2017-06-17 ENCOUNTER — Other Ambulatory Visit: Payer: Self-pay | Admitting: Cardiology

## 2017-06-28 ENCOUNTER — Other Ambulatory Visit: Payer: Self-pay

## 2017-06-28 DIAGNOSIS — C569 Malignant neoplasm of unspecified ovary: Secondary | ICD-10-CM

## 2017-06-29 ENCOUNTER — Ambulatory Visit (HOSPITAL_BASED_OUTPATIENT_CLINIC_OR_DEPARTMENT_OTHER): Payer: Medicare Other

## 2017-06-29 ENCOUNTER — Other Ambulatory Visit: Payer: Medicare Other

## 2017-06-29 DIAGNOSIS — C561 Malignant neoplasm of right ovary: Secondary | ICD-10-CM

## 2017-06-29 DIAGNOSIS — C7989 Secondary malignant neoplasm of other specified sites: Secondary | ICD-10-CM

## 2017-06-29 DIAGNOSIS — Z95828 Presence of other vascular implants and grafts: Secondary | ICD-10-CM

## 2017-06-29 DIAGNOSIS — C569 Malignant neoplasm of unspecified ovary: Secondary | ICD-10-CM

## 2017-06-29 MED ORDER — SODIUM CHLORIDE 0.9% FLUSH
10.0000 mL | Freq: Once | INTRAVENOUS | Status: AC
  Administered 2017-06-29: 10 mL
  Filled 2017-06-29: qty 10

## 2017-06-29 MED ORDER — HEPARIN SOD (PORK) LOCK FLUSH 100 UNIT/ML IV SOLN
500.0000 [IU] | Freq: Once | INTRAVENOUS | Status: AC
  Administered 2017-06-29: 500 [IU]
  Filled 2017-06-29: qty 5

## 2017-06-30 LAB — CA 125: Cancer Antigen (CA) 125: 32.9 U/mL (ref 0.0–38.1)

## 2017-07-01 ENCOUNTER — Encounter: Payer: Self-pay | Admitting: Gynecology

## 2017-07-01 ENCOUNTER — Ambulatory Visit: Payer: Medicare Other | Attending: Gynecology | Admitting: Gynecology

## 2017-07-01 VITALS — BP 143/72 | HR 68 | Temp 98.1°F | Resp 20 | Ht 63.0 in | Wt 132.9 lb

## 2017-07-01 DIAGNOSIS — I480 Paroxysmal atrial fibrillation: Secondary | ICD-10-CM | POA: Diagnosis not present

## 2017-07-01 DIAGNOSIS — Z8249 Family history of ischemic heart disease and other diseases of the circulatory system: Secondary | ICD-10-CM | POA: Insufficient documentation

## 2017-07-01 DIAGNOSIS — Z90722 Acquired absence of ovaries, bilateral: Secondary | ICD-10-CM | POA: Insufficient documentation

## 2017-07-01 DIAGNOSIS — Z7901 Long term (current) use of anticoagulants: Secondary | ICD-10-CM | POA: Insufficient documentation

## 2017-07-01 DIAGNOSIS — Z96652 Presence of left artificial knee joint: Secondary | ICD-10-CM | POA: Diagnosis not present

## 2017-07-01 DIAGNOSIS — Z90710 Acquired absence of both cervix and uterus: Secondary | ICD-10-CM | POA: Diagnosis not present

## 2017-07-01 DIAGNOSIS — E039 Hypothyroidism, unspecified: Secondary | ICD-10-CM | POA: Diagnosis not present

## 2017-07-01 DIAGNOSIS — C569 Malignant neoplasm of unspecified ovary: Secondary | ICD-10-CM | POA: Diagnosis not present

## 2017-07-01 DIAGNOSIS — Z888 Allergy status to other drugs, medicaments and biological substances status: Secondary | ICD-10-CM | POA: Insufficient documentation

## 2017-07-01 DIAGNOSIS — Z85828 Personal history of other malignant neoplasm of skin: Secondary | ICD-10-CM | POA: Diagnosis not present

## 2017-07-01 DIAGNOSIS — Z96641 Presence of right artificial hip joint: Secondary | ICD-10-CM | POA: Diagnosis not present

## 2017-07-01 DIAGNOSIS — I252 Old myocardial infarction: Secondary | ICD-10-CM | POA: Insufficient documentation

## 2017-07-01 DIAGNOSIS — Z79899 Other long term (current) drug therapy: Secondary | ICD-10-CM | POA: Insufficient documentation

## 2017-07-01 DIAGNOSIS — M1990 Unspecified osteoarthritis, unspecified site: Secondary | ICD-10-CM | POA: Diagnosis not present

## 2017-07-01 DIAGNOSIS — E785 Hyperlipidemia, unspecified: Secondary | ICD-10-CM | POA: Diagnosis not present

## 2017-07-01 DIAGNOSIS — Z79811 Long term (current) use of aromatase inhibitors: Secondary | ICD-10-CM | POA: Insufficient documentation

## 2017-07-01 DIAGNOSIS — N951 Menopausal and female climacteric states: Secondary | ICD-10-CM | POA: Diagnosis not present

## 2017-07-01 DIAGNOSIS — I10 Essential (primary) hypertension: Secondary | ICD-10-CM | POA: Diagnosis not present

## 2017-07-01 DIAGNOSIS — Z885 Allergy status to narcotic agent status: Secondary | ICD-10-CM | POA: Insufficient documentation

## 2017-07-01 DIAGNOSIS — Z9221 Personal history of antineoplastic chemotherapy: Secondary | ICD-10-CM | POA: Insufficient documentation

## 2017-07-01 DIAGNOSIS — Z809 Family history of malignant neoplasm, unspecified: Secondary | ICD-10-CM | POA: Insufficient documentation

## 2017-07-01 NOTE — Progress Notes (Signed)
Consult Note: Gyn-Onc   [name] 76 y.o. female  Chief Complaint  Patient presents with  . 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 Currently taking Aromisin 25 mg daily. She is tolerating the current regimen reasonably well except for hot flushes and thinning of her hair. CA-125 was 32 (previously 24).  We will continue the current regimen for 2 more months and repeat a CA-125. I reviewed the patient's CT scan with her showing her the 2 prominent lesions near the spleen and the left pelvis. Interval History:   The patient returns today as previously scheduled.  At her last visit was noted her CA-125 was elevated (24 units per mL, previously 14 units per mL) she was asymptomatic but we repeated a CT scan showing a 2.5 x 2.4 cm lesion in the gastrosplenic ligament and a second lesion measuring 2.4 x 2.3 cm in the left pelvis. Previously the patient had responded to letrozole although the side effects were intolerable. We therefore started the patient on Aromasin 25 mg daily. She's been taking that for the past 2 months.  The patient denies any GI, GU or pelvic symptoms.  HPI:The patient initially presented with a pelvic mass and elevated CA 125 (178 units per mL) She underwent exploratory laparotomy and debulking on 02/13/2013. Final pathology showed a poorly differentiated ovarian cancer involving both ovaries pelvic peritoneum and rectal muscularis. All gross disease was resected.  She then received 6 cycles of carboplatin and Taxol chemotherapy completed in September 2014. At the completion of chemotherapy her CA 125 was 18 units per mL.   In May 2016 the patient CA-125 began to rise and a PET CT scan showed metastatic disease in the spleen and pelvic lymph nodes. Given the long platinum free interval , we retreated with carboplatin, Taxol, and a Avastin. She received 6 cycles of  carboplatin and Taxol and a Avastin the last being administered in September 2016. She had a significant response with CA 125 falling to 12 units per mL in September. CT scan showed persistent disease in the spleen (which was actually responding). Patient received a total 8 cycles and on follow-up in December 2016 had persistent disease in the spleen which had slightly decreased in size. At that time CA-125 is 14 units per mL.  In March 2017 we switch the patient to letrozole.   A CT scan obtained 06/23/2016 showed a response with the spleen met measure 1.5 x 1.5 cm (previously 2.4 x 3.9 cm). Because of side effects resolve was discontinued in November 2017. A follow-up CT scan on 12/01/2016 showed no evidence of the splenic lesion and her CA-125 at that time was 14 units per mL.  In May 2018 CA-125 rose to 24 units per mL and a follow-up CT scan showed new lesions in the gastrosplenic ligament ( 2.5 cm) and a lesion in the left pelvis measuring 2.4 cm. Because she had previously responded to Letrozole ( although she did not tolerate let resolve very well) we began Aromasin 25 mg daily.   Review of Systems:10 point review of systems is negative except as noted in interval history.   Vitals: Blood pressure (!) 143/72, pulse 68, temperature 98.1 F (36.7 C), temperature source Oral, resp. rate 20, height 5' 3"  (1.6 m), weight 132 lb 14.4 oz (60.3 kg), SpO2 100 %.  Physical Exam: General : The patient is a healthy woman in no  acute distress.  HEENT: normocephalic, extraoccular movements normal; neck is supple without thyromegally , Port-A-Cath appears healthy is no evidence of infection in the right chest wall. Lynphnodes: Supraclavicular and inguinal nodes not enlarged  Abdomen: Soft, non-tender, no ascites, no organomegally, no masses, no hernias , midline incision is healing well  Pelvic:    EGBUS: Normal female  Vagina bladder urethra: Normal  Vaginal cuff is well healed.  Cervix and uterus  are surgically absent  Bimanual exam: No masses nodularity or fullness.  Rectovaginal exam confirms      Lower extremities: No edema or varicosities. Normal range of motion      Allergies  Allergen Reactions  . Clonidine Derivatives Shortness Of Breath  . Coreg [Carvedilol] Shortness Of Breath  . Caffeine Other (See Comments)    Makes heart race  . Codeine Other (See Comments)    Does not like the feeling she gets  . Flexeril [Cyclobenzaprine] Other (See Comments)    Pt states "increased heart rate"  . Lipitor [Atorvastatin] Dermatitis    "feels like bugs are biting her"   . Lisinopril     LIP NUMBNESS  . Pravastatin Other (See Comments)    FEELS LIKE "BUG BITES" BITING HER LEGS   . Tape Rash    TEGADERM.   (use opsite on PAC)  . Tegaderm Ag Mesh [Silver] Rash and Other (See Comments)    Burns skin  . Ultram [Tramadol] Palpitations    Past Medical History:  Diagnosis Date  . Arthritis    OSTEOARTHRITIS   -- CONSTANT PAIN RIGHT HIP---AND PAIN LEFT KNEE--PT STATES SHE GETS INJECTIONS INTO HER KNEE  . Complication of anesthesia    BLOOD PRESSURE DROPPED WITH NASAL SURGERY, ONE OF THE CARPAL TUNNEL REPAIRS AND DURING A COLONOSCOPY  . Dyslipidemia   . History of skin cancer   . Hypertension   . Hypothyroidism   . Menopausal symptoms   . NSTEMI (non-ST elevated myocardial infarction) ([place]) 12/09/15   Medical management: Distal branch of D1 95%, ostial D2 75%, distal LAD 50%. Tortuous arteries consistent with hypertension  . Ovarian cancer ([place]) 01/2013   Recurrence since 2014/2016  . PAF (paroxysmal atrial fibrillation) ([place])    On ELIQUIS; On Amiodarone (Eye Exam 12/25/15) - no longer on Rythmol    Past Surgical History:  Procedure Laterality Date  . BILATERAL CARPAL TUNNEL REPAIR  2007  . BREAST BIOPSY Left 03/19/2014   benign  . CARDIAC CATHETERIZATION N/A 12/05/2015   Procedure: Left Heart Cath and Coronary Angiography;  Surgeon: Belva Crome, MD;  Location: [facility];  Service: Cardiovascular;  distal branch of D1 95%, ostial D2 75%, dLAD 50%,p-mRCA 40%. Tortuous vessels.  Marland Kitchen DILATION AND CURETTAGE OF UTERUS  1969  . LAPAROTOMY Bilateral 02/13/2013   Procedure: EXPLORATORY LAPAROTOMY TOTAL ABDOMINAL HYSTERECTOMY BILATERAL SALPINGO-OOPHORECTOMY, Partial Rectal Resection with Reanastamosis;  Surgeon: Alvino Chapel, MD;  Location: [facility];  Service: Gynecology;  Laterality: Bilateral;  . LEFT KNEE ARTHROSCOPY   2011  . LYMPHADENECTOMY Right 02/13/2013   Procedure: PEVLIC  LYMPHADENECTOMY, DEBULKING right pelvic tumor nodules;  Surgeon: Alvino Chapel, MD;  Location: [facility];  Service: Gynecology;  Laterality: Right;  . NM MYOVIEW LTD  June 2010    subbmaximal with no ischemia or infarction.  . OMENTECTOMY  02/13/2013   Procedure: OMENTECTOMY;  Surgeon: Alvino Chapel, MD;  Location: [facility];  Service: Gynecology;;  . [place]  . SURGERY FOR RUPTURED  OVARIAN CYST  1969  . SYNOVECTOMY WITH POLY EXCHANGE Left 03/18/2017   Procedure: LEFT KNEE PARTIAL SYNOVECTOMY WITH POLY EXCHANGE;  Surgeon: Mcarthur Rossetti, MD;  Location: [facility];  Service: Orthopedics;  Laterality: Left;  Adductor Block  . TAH/BSO/Tumor debulking with right pelvic LND  01/2013  . TONSILLECTOMY  1962  . TOTAL HIP ARTHROPLASTY  02/25/2012   Procedure: TOTAL HIP ARTHROPLASTY ANTERIOR APPROACH;  Surgeon: Mcarthur Rossetti, MD;  Location: [facility];  Service: Orthopedics;  Laterality: Right;  . TOTAL KNEE ARTHROPLASTY Left 01/03/2015   Procedure: LEFT TOTAL KNEE ARTHROPLASTY;  Surgeon: Mcarthur Rossetti, MD;  Location: [facility];  Service: Orthopedics;  Laterality: Left;  . TRANSTHORACIC ECHOCARDIOGRAM  May 2014; January 2016   a. Normal LV size function. EF 60-65%. Grade 1 diastolic function. Mild MR and mildly elevated PA pressures of 37 mmHg per;; b. EF 60-65%. 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    Current Outpatient Prescriptions  Medication Sig Dispense Refill  . acetaminophen (TYLENOL) 500 MG tablet Take 1,000 mg by mouth every 6 (six) hours as needed for mild pain.     Marland Kitchen amiodarone (PACERONE) 200 MG tablet Take 100 mg by mouth daily.  3  . Biotin 10 MG TABS Take 10 mg by mouth daily.     Marland Kitchen desonide (DESOWEN) 0.05 % cream Apply 1 application topically 2 (two) times daily.   1  . diphenhydrAMINE (BENADRYL) 25 MG tablet Take 50 mg by mouth at bedtime as needed for sleep.     Marland Kitchen docusate sodium (COLACE) 100 MG capsule Take 250 mg by mouth 2 (two) times daily.     Marland Kitchen ELIQUIS 5 MG TABS tablet TAKE 1 TABLET (5 MG TOTAL) BY MOUTH 2 (TWO) TIMES DAILY. 60 tablet 5  . exemestane (AROMASIN) 25 MG tablet Take 1 tablet (25 mg total) by mouth daily after breakfast. 30 tablet 6  . ezetimibe (ZETIA) 10 MG tablet Take 1 tablet (10 mg total) by mouth daily. (Patient taking differently: Take 10 mg by mouth at bedtime. ) 30 tablet 6  . Glucosamine HCl 1000 MG TABS Take 1,000 mg by mouth daily.    . hydrocortisone 1 % ointment Apply 1 application topically 2 (two) times daily as needed for itching (eczema).     Marland Kitchen ipratropium (ATROVENT) 0.06 % nasal spray Place 2 sprays into both nostrils daily.   5  . irbesartan (AVAPRO) 300 MG tablet TAKE 1 TABLET (300 MG TOTAL) BY MOUTH DAILY. (Patient taking differently: TAKE 1/2 TABLET (300 MG TOTAL) BY MOUTH every evening) 90 tablet 1  . levothyroxine (SYNTHROID) 88 MCG tablet Take 88 mcg by mouth daily before breakfast.    . lidocaine-prilocaine (EMLA) cream Apply to Porta-Cath site 1-2 hours prior to access as directed. (Patient taking differently: Apply 1 application topically See admin instructions. Apply to [facility] site 1-2 hours prior to access as directed.) 30 g 2  . magnesium hydroxide (MILK OF MAGNESIA) 800 MG/5ML suspension Take 30 mLs by mouth daily as needed for constipation. Reported on 11/20/2015    . Melatonin 5 MG CAPS  Take 5 mg by mouth at bedtime as needed (sleep). Reported on 12/25/2015    . Menthol, Topical Analgesic, (ICY HOT EX) Apply 1 application topically 2 (two) times daily as needed (PAIN).     . Misc Natural Products (TART CHERRY ADVANCED) CAPS Take 1 capsule by mouth daily. 1200 mg per capsule    .  Multiple Vitamin (MULTIVITAMIN WITH MINERALS) TABS tablet Take 1 tablet by mouth daily. Centrum Silver    . Polyethyl Glycol-Propyl Glycol (SYSTANE OP) Place 1 drop into both eyes 2 (two) times daily.     . polyethylene glycol (MIRALAX / GLYCOLAX) packet Take 17 g by mouth daily. Mix in 8 oz liquid and drink    . potassium chloride (K-DUR) 10 MEQ tablet Take 10 mEq by mouth every Monday, Wednesday, and Friday.    Marland Kitchen SHINGRIX injection     . spironolactone (ALDACTONE) 25 MG tablet Take 25 mg by mouth See admin instructions. Takes on Tuesdays, Thursday, Saturday, Sunday    . amoxicillin (AMOXIL) 500 MG capsule Take 2,000 mg by mouth See admin instructions. Takes 4 capsules 1 hour prior to procedure    . Evolocumab with Infusor (Carmi) 420 MG/3.5ML SOCT Inject 420 mg into the skin every 30 (thirty) days. (Patient not taking: Reported on 05/03/2017) 1 Cartridge 12   No current facility-administered medications for this visit.     Social History   Social History  . Marital status: Married    Spouse name: N/A  . Number of children: N/A  . Years of education: N/A   Occupational History  . Not on file.   Social History Main Topics  . Smoking status: Never Smoker  . Smokeless tobacco: Never Used  . Alcohol use No  . Drug use: No  . Sexual activity: Not Currently   Other Topics Concern  . Not on file   Social History Narrative   Married mother of 3, grandmother 91.   Exercises 6/7 days a week walking 30 minutes a time.   Never smoked or drank alcohol.    Family History  Problem Relation Age of Onset  . Hypertension Mother   . Basal cell carcinoma Mother   . AAA (abdominal  aortic aneurysm) Father   . Heart Problems Maternal Uncle   . Heart Problems Maternal Grandfather   . AAA (abdominal aortic aneurysm) Paternal Grandmother   . Prostate cancer Maternal Uncle 70  . Prostate cancer Other   . Other Daughter        one daughter had TAH-BSO at 44; other daughter will have one soon      Marti Sleigh, MD 07/01/2017, 10:49 AM

## 2017-07-01 NOTE — Patient Instructions (Signed)
Plan to follow up in two months with your CA 125 level drawn before.  Please call for any questions or concerns.

## 2017-07-04 ENCOUNTER — Telehealth: Payer: Self-pay | Admitting: Cardiology

## 2017-07-04 DIAGNOSIS — M79645 Pain in left finger(s): Secondary | ICD-10-CM | POA: Insufficient documentation

## 2017-07-04 DIAGNOSIS — M65342 Trigger finger, left ring finger: Secondary | ICD-10-CM | POA: Insufficient documentation

## 2017-07-04 DIAGNOSIS — M19042 Primary osteoarthritis, left hand: Secondary | ICD-10-CM | POA: Insufficient documentation

## 2017-07-04 NOTE — Telephone Encounter (Signed)
New Message  Pt c/o medication issue:  1. Name of Medication: ezetimibe  2. How are you currently taking this medication (dosage and times per day)? 10mg    3. Are you having a reaction (difficulty breathing--STAT)? No  4. What is your medication issue? Pt call requesting to speak with RN to see if she needs to continue to take this medication .please call back to discuss

## 2017-07-04 NOTE — Telephone Encounter (Signed)
Returned call to patient. She just started on Repatha this weekend and would like to know if she is to continue zetia. Advised would defer to pharmacy staff for advice on this. She states OK to leave a detailed VM.

## 2017-07-05 NOTE — Telephone Encounter (Signed)
Patient should continue zetia while on Repatha.

## 2017-07-05 NOTE — Telephone Encounter (Signed)
Talked to patient. She is to continue Zetia while on Repatha. Patient agreed and will schedule f/u lipid panel after Repatha 6th dose

## 2017-07-18 ENCOUNTER — Telehealth (INDEPENDENT_AMBULATORY_CARE_PROVIDER_SITE_OTHER): Payer: Self-pay | Admitting: Radiology

## 2017-07-18 ENCOUNTER — Telehealth (INDEPENDENT_AMBULATORY_CARE_PROVIDER_SITE_OTHER): Payer: Self-pay | Admitting: Orthopaedic Surgery

## 2017-07-18 NOTE — Telephone Encounter (Signed)
She has not had an injection apparently with Dr. Ernestina Patches since 2015. At that visit it was facet joint injections but she has had epidural injections in the past by him but is been a very long period time. I agree that she needs to be seen by either me or Dr. Ernestina Patches to determine where injection will be needed. She would need to be off of blood thinner for that injection with Dr. Ernestina Patches. She has not had an MRI of her spine in the long period time we would need to potentially have one of those prior to determining where to inject. Most important thing right now that would be some type of exam till plus determine where the best place to inject would be. So it sounds likely need to see her first.

## 2017-07-18 NOTE — Telephone Encounter (Signed)
Patient is calling states that she is having right sided back and hip pain, she states that she feels like it is coming from her back.  She would like an injection with Dr. Ernestina Patches.  Please advise.  Was told she would need to follow up with Dr. Ninfa Linden.  Pain onset got really bad in the middle of last night.  Tried tylenol and Icy hot with out relief. Please advise.

## 2017-07-18 NOTE — Telephone Encounter (Signed)
Can you call her and tell her Ernestina Patches and Ninfa Linden state she needs appointment before we can schedule another [facility]

## 2017-07-18 NOTE — Telephone Encounter (Signed)
Please advise 

## 2017-07-18 NOTE — Telephone Encounter (Signed)
Patient called needing Rx refilled (Robaxin) The number to contact patient is [phone]

## 2017-07-18 NOTE — Telephone Encounter (Signed)
Patient scheduled with Artis Delay 07/27/17 at 2:45PM

## 2017-07-19 ENCOUNTER — Other Ambulatory Visit (INDEPENDENT_AMBULATORY_CARE_PROVIDER_SITE_OTHER): Payer: Self-pay

## 2017-07-19 MED ORDER — METHOCARBAMOL 500 MG PO TABS: 500.0000 mg | ORAL_TABLET | Freq: Four times a day (QID) | ORAL | 0 refills | Status: DC | PRN

## 2017-07-19 NOTE — Telephone Encounter (Signed)
Please advise 

## 2017-07-19 NOTE — Telephone Encounter (Signed)
Called into her pharmacy  

## 2017-07-19 NOTE — Telephone Encounter (Signed)
Okay to send in Robaxin to her pharmacy. 500 mg to take 1 every 6-8 hours as needed for pain and spasms. #60 with 1 refill.

## 2017-07-27 ENCOUNTER — Encounter (INDEPENDENT_AMBULATORY_CARE_PROVIDER_SITE_OTHER): Payer: Self-pay | Admitting: Physician Assistant

## 2017-07-27 ENCOUNTER — Ambulatory Visit (INDEPENDENT_AMBULATORY_CARE_PROVIDER_SITE_OTHER): Payer: Medicare Other | Admitting: Physician Assistant

## 2017-07-27 ENCOUNTER — Ambulatory Visit (INDEPENDENT_AMBULATORY_CARE_PROVIDER_SITE_OTHER): Payer: Medicare Other

## 2017-07-27 DIAGNOSIS — M545 Low back pain, unspecified: Secondary | ICD-10-CM

## 2017-07-27 DIAGNOSIS — G8929 Other chronic pain: Secondary | ICD-10-CM

## 2017-07-27 MED ORDER — METHOCARBAMOL 500 MG PO TABS: 500.0000 mg | ORAL_TABLET | Freq: Three times a day (TID) | ORAL | 1 refills | Status: DC

## 2017-07-27 MED ORDER — METHYLPREDNISOLONE 4 MG PO TABS: ORAL_TABLET | ORAL | 0 refills | Status: DC

## 2017-07-27 NOTE — Progress Notes (Signed)
Office Visit Note   Patient: [name]           Date of Birth: 1941-04-03           MRN: [id number] Visit Date: 07/27/2017              Requested by: Shon Baton, [address] [place], [place] [address] PCP: Shon Baton, MD   Assessment & Plan: Visit Diagnoses:  1. Chronic bilateral low back pain without sciatica     Plan:We'll place her on a Medrol Dosepak. If she's not trending towards improvement in 2 weeks like her to call our office and we obtain an MRI of her back for epidural steroid injection planning. Her last MRI was back in 2015 and since then her symptoms changed and now more on the right than the left. Questions are encouraged and answered. I did discuss with her formal therapy and also discussed a home exercise program she is not interested in either because she states it makes her pain in her back actually worse.  Follow-Up Instructions: Return in about 4 weeks (around 08/24/2017).   Orders:  Orders Placed This Encounter  Procedures  . XR Lumbar Spine 2-3 Views   Meds ordered this encounter  Medications  . DISCONTD: methocarbamol (ROBAXIN) 500 MG tablet    Sig: Take 1 tablet (500 mg total) by mouth 3 (three) times daily.    Dispense:  40 tablet    Refill:  1  . methylPREDNISolone (MEDROL) 4 MG tablet    Sig: Take as directed    Dispense:  21 tablet    Refill:  0      Procedures: No procedures performed   Clinical Data: No additional findings.   Subjective: Chief Complaint  Patient presents with  . 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 She has a history of right hip arthroplasty March 2013. She's having no pain in her hip. Having no groin pain particularly. 7 pain in the right lower back no radicular symptoms down either leg. Pain mostly is been in lower left lumbar region and she's had epidural steroids in the  past. 7 no bowel bladder dysfunction. She has taken some methocarbamol and this does help. Pain does awaken her at night. She has a history of ovarian cancer. Review of Systems Denies any chest pain shortness of breath, nausea vomiting, fevers, chills, or weight loss  Objective: Vital Signs: There were no vitals taken for this visit.  Physical Exam  Constitutional: She is oriented to person, place, and time. She appears well-developed and well-nourished. No distress.  Cardiovascular: Intact distal pulses.   Pulmonary/Chest: Effort normal.  Neurological: She is alert and oriented to person, place, and time.  Skin: She is not diaphoretic.  Psychiatric: She has a normal mood and affect. Her behavior is normal.    Ortho Exam 5 out of 5 strength throughout the lower extremities against resistance. Exquisitely tight hamstrings bilaterally. She has limited flexion-extension lumbar spine. Good range of motion both hips without pain no tenderness over the trochanteric region. Deep tendon reflexes are 2+ at knees and 1+ at the ankles and equal and symmetric. Sensation grossly intact bilateral feet. Specialty Comments:  No specialty comments available.  Imaging: Xr Lumbar Spine 2-3 Views  Result Date: 07/27/2017 AP and lateral views lumbar spine: No acute fractures. Generative disc disease diffusely. Scoliosis.  No spondylolisthesis.    PMFS History: Patient Active Problem List   Diagnosis Date Noted  . Failed total left knee replacement ([place]) 03/18/2017  . Status post revision of total knee replacement, left 03/18/2017  . Thrombocytopenia ([place]) 03/27/2016  . High risk medications (not anticoagulants) long-term use 03/27/2016  . Chronic anticoagulation 03/27/2016  . Atherosclerotic heart disease of native coronary artery without angina pectoris 03/03/2016  . Bilateral hearing loss 02/16/2016  . Bilateral impacted cerumen 02/16/2016  . Rhinitis, chronic 02/16/2016  . Embolic stroke ([place])  37/62/8315  . Vaginal atrophy 12/29/2015  . On amiodarone therapy 12/23/2015  . Dyslipidemia, goal LDL below 70   . Stroke with cerebral ischemia ([place])   . Visual disturbance   . Cerebral thrombosis with cerebral infarction 12/08/2015  . NSTEMI (non-ST elevated myocardial infarction) ([place]) 12/04/2015  . 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 Chemotherapy-induced peripheral neuropathy ([place]) 07/23/2015  . Portacath in place 07/23/2015  . Recurrent carcinoma of ovary ([place]) 07/23/2015  . International Federation of Gynecology and Obstetrics (FIGO) 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[place]) 07/23/2015  . Genetic testing 07/14/2015  . Encounter for antineoplastic chemotherapy 06/25/2015  . Port catheter in place 06/25/2015  . Antineoplastic chemotherapy induced pancytopenia ([place]) 06/02/2015  . Dehydration 06/02/2015  . 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 Chemotherapy induced neutropenia ([place]) 05/13/2015  . Chemotherapy induced thrombocytopenia 05/13/2015  . Hyperbilirubinemia 05/06/2015  . Osteoarthritis of left knee 01/03/2015  . Status post total left knee replacement 01/03/2015  . PAF (paroxysmal atrial fibrillation) ([place]): Symptomatic - Rhythm control with Amiodarone. CHA2DS2Vasc Score 7: On Eliquis   . Fever 05/03/2013    Class: Acute  . Nausea 05/03/2013    Class: Acute  . Constipation 05/03/2013    Class: Acute  . Syncope, history of 04/19/2013  . Essential hypertension 04/19/2013  . Hypothyroidism 04/19/2013  . Degenerative arthritis of hip 02/25/2012   Past Medical History:  Diagnosis Date  . Arthritis    OSTEOARTHRITIS   -- CONSTANT PAIN RIGHT HIP---AND PAIN LEFT KNEE--PT STATES SHE GETS INJECTIONS INTO HER KNEE  . Complication of anesthesia    BLOOD PRESSURE DROPPED WITH NASAL SURGERY, ONE OF THE CARPAL TUNNEL REPAIRS AND DURING A COLONOSCOPY  . Dyslipidemia   . History of skin cancer   . Hypertension   .  Hypothyroidism   . Menopausal symptoms   . NSTEMI (non-ST elevated myocardial infarction) ([place]) 12/09/15   Medical management: Distal branch of D1 95%, ostial D2 75%, distal LAD 50%. Tortuous arteries consistent with hypertension  . Ovarian cancer ([place]) 01/2013   Recurrence since 2014/2016  . PAF (paroxysmal atrial fibrillation) ([place])    On ELIQUIS; On Amiodarone (Eye Exam 12/25/15) - no longer on Rythmol    Family History  Problem Relation Age of Onset  . Hypertension Mother   . Basal cell carcinoma Mother   . AAA (abdominal aortic aneurysm) Father   . Heart Problems Maternal Uncle   . Heart Problems Maternal Grandfather   . AAA (abdominal aortic aneurysm) Paternal Grandmother   . Prostate cancer Maternal Uncle 70  . Prostate cancer Other   . Other Daughter        one daughter had TAH-BSO at 9; other daughter will have one soon    Past Surgical History:  Procedure Laterality Date  . BILATERAL CARPAL TUNNEL REPAIR  2007  . BREAST BIOPSY Left 03/19/2014   benign  . CARDIAC CATHETERIZATION N/A 12/05/2015  Procedure: Left Heart Cath and Coronary Angiography;  Surgeon: Belva Crome, MD;  Location: [facility];  Service: Cardiovascular;  distal branch of D1 95%, ostial D2 75%, dLAD 50%,p-mRCA 40%. Tortuous vessels.  Marland Kitchen DILATION AND CURETTAGE OF UTERUS  1969  . LAPAROTOMY Bilateral 02/13/2013   Procedure: EXPLORATORY LAPAROTOMY TOTAL ABDOMINAL HYSTERECTOMY BILATERAL SALPINGO-OOPHORECTOMY, Partial Rectal Resection with Reanastamosis;  Surgeon: Alvino Chapel, MD;  Location: [facility];  Service: Gynecology;  Laterality: Bilateral;  . LEFT KNEE ARTHROSCOPY   2011  . LYMPHADENECTOMY Right 02/13/2013   Procedure: PEVLIC  LYMPHADENECTOMY, DEBULKING right pelvic tumor nodules;  Surgeon: Alvino Chapel, MD;  Location: [facility];  Service: Gynecology;  Laterality: Right;  . NM MYOVIEW LTD  June 2010    subbmaximal with no ischemia or infarction.  . OMENTECTOMY  02/13/2013    Procedure: OMENTECTOMY;  Surgeon: Alvino Chapel, MD;  Location: [facility];  Service: Gynecology;;  . [place]  . SURGERY FOR RUPTURED OVARIAN CYST  1969  . SYNOVECTOMY WITH POLY EXCHANGE Left 03/18/2017   Procedure: LEFT KNEE PARTIAL SYNOVECTOMY WITH POLY EXCHANGE;  Surgeon: Mcarthur Rossetti, MD;  Location: [facility];  Service: Orthopedics;  Laterality: Left;  Adductor Block  . TAH/BSO/Tumor debulking with right pelvic LND  01/2013  . TONSILLECTOMY  1962  . TOTAL HIP ARTHROPLASTY  02/25/2012   Procedure: TOTAL HIP ARTHROPLASTY ANTERIOR APPROACH;  Surgeon: Mcarthur Rossetti, MD;  [facility]: [facility];  Service: Orthopedics;  Laterality: Right;  . TOTAL KNEE ARTHROPLASTY Left 01/03/2015   Procedure: LEFT TOTAL KNEE ARTHROPLASTY;  Surgeon: Mcarthur Rossetti, MD;  [facility];  Service: Orthopedics;  Laterality: Left;  . TRANSTHORACIC ECHOCARDIOGRAM  May 2014; January 2016   a. Normal LV size function. EF 60-65%. Grade 1 diastolic function. Mild MR and mildly elevated PA pressures of 37 mmHg per;; b. EF 60-65%. Mobile echodensity 10 mm x 6 mm attest interventricular septum is questionable fibroblastoma.   Social History   Occupational History  . Not on file.   Social History Main Topics  . Smoking status: Never Smoker  . Smokeless tobacco: Never Used  . Alcohol use No  . Drug use: No  . Sexual activity: Not Currently

## 2017-08-15 ENCOUNTER — Ambulatory Visit (INDEPENDENT_AMBULATORY_CARE_PROVIDER_SITE_OTHER): Payer: Medicare Other

## 2017-08-15 ENCOUNTER — Encounter (INDEPENDENT_AMBULATORY_CARE_PROVIDER_SITE_OTHER): Payer: Self-pay | Admitting: Orthopaedic Surgery

## 2017-08-15 ENCOUNTER — Ambulatory Visit (INDEPENDENT_AMBULATORY_CARE_PROVIDER_SITE_OTHER): Payer: Medicare Other | Admitting: Orthopaedic Surgery

## 2017-08-15 DIAGNOSIS — S52124A Nondisplaced fracture of head of right radius, initial encounter for closed fracture: Secondary | ICD-10-CM | POA: Diagnosis not present

## 2017-08-15 NOTE — Progress Notes (Signed)
Office Visit Note   Patient: [name]           Date of Birth: 1941/06/14           MRN: [id number] Visit Date: 08/15/2017              Requested by: Shon Baton, [place] [place], [place] [address] PCP: Shon Baton, MD   Assessment & Plan: Visit Diagnoses:  1. Closed nondisplaced fracture of head of right radius, initial encounter     Plan: Overall impression is nondisplaced radial head fracture. We will immobilized with a shoulder sling for 7-10 days and then she should begin gentle range of motion. Nonweightbearing for 4-6 weeks. May increase weightbearing activity as symptoms allow after that. Questions encouraged and answered.  Follow-Up Instructions: Return if symptoms worsen or fail to improve.   Orders:  Orders Placed This Encounter  Procedures  . XR Elbow 2 Views Right   No orders of the defined types were placed in this encounter.     Procedures: No procedures performed   Clinical Data: No additional findings.   Subjective: Chief Complaint  Patient presents with  . Right Elbow - Pain, Follow-up    Patient is a 76 year old female who had a mechanical fall earlier today onto an outstretched arm. She had immediate pain in her right elbow. She denies any numbness or tingling. Pain is severe with movement of the elbow and use of the arm. She denies any shoulder and wrist pain.    Review of Systems  Constitutional: Negative.   HENT: Negative.   Eyes: Negative.   Respiratory: Negative.   Cardiovascular: Negative.   Endocrine: Negative.   Musculoskeletal: Negative.   Neurological: Negative.   Hematological: Negative.   Psychiatric/Behavioral: Negative.   All other systems reviewed and are negative.    Objective: Vital Signs: There were no vitals taken for this visit.  Physical Exam  Constitutional: She is oriented to person, place, and time. She appears well-developed and well-nourished.  Pulmonary/Chest: Effort normal.    Neurological: She is alert and oriented to person, place, and time.  Skin: Skin is warm. Capillary refill takes less than 2 seconds.  Psychiatric: She has a normal mood and affect. Her behavior is normal. Judgment and thought content normal.  Nursing note and vitals reviewed.   Ortho Exam Right elbow exam shows tenderness over the radial head. She has had catching pain with extremes of elbow flexion and extension and supination pronation. Lateral upper condyle and olecranon are nontender. Specialty Comments:  No specialty comments available.  Imaging: No results found.   PMFS History: Patient Active Problem List   Diagnosis Date Noted  . Closed nondisplaced fracture of head of right radius 08/15/2017  . Failed total left knee replacement ([place]) 03/18/2017  . Status post revision of total knee replacement, left 03/18/2017  . Thrombocytopenia ([place]) 03/27/2016  . High risk medications (not anticoagulants) long-term use 03/27/2016  . Chronic anticoagulation 03/27/2016  . Atherosclerotic heart disease of native coronary artery without angina pectoris 03/03/2016  . Bilateral hearing loss 02/16/2016  . Bilateral impacted cerumen 02/16/2016  . Rhinitis, chronic 02/16/2016  . Embolic stroke ([place]) 69/67/8938  . Vaginal atrophy 12/29/2015  . On amiodarone therapy 12/23/2015  . Dyslipidemia, goal LDL below 70   . Stroke with cerebral ischemia ([place])   . Visual disturbance   . Cerebral thrombosis with cerebral infarction 12/08/2015  . NSTEMI (non-ST elevated myocardial infarction) ([place]) 12/04/2015  . 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 Chemotherapy-induced peripheral neuropathy ([place]) 07/23/2015  . Portacath in place 07/23/2015  . Recurrent carcinoma of ovary ([place]) 07/23/2015  . International Federation of Gynecology and Obstetrics (FIGO) 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[place]) 07/23/2015  . Genetic testing 07/14/2015  . Encounter for  antineoplastic chemotherapy 06/25/2015  . Port catheter in place 06/25/2015  . Antineoplastic chemotherapy induced pancytopenia ([place]) 06/02/2015  . Dehydration 06/02/2015  . 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 Chemotherapy induced neutropenia ([place]) 05/13/2015  . Chemotherapy induced thrombocytopenia 05/13/2015  . Hyperbilirubinemia 05/06/2015  . Osteoarthritis of left knee 01/03/2015  . Status post total left knee replacement 01/03/2015  . PAF (paroxysmal atrial fibrillation) ([place]): Symptomatic - Rhythm control with Amiodarone. CHA2DS2Vasc Score 7: On Eliquis   . Fever 05/03/2013    Class: Acute  . Nausea 05/03/2013    Class: Acute  . Constipation 05/03/2013    Class: Acute  . Syncope, history of 04/19/2013  . Essential hypertension 04/19/2013  . Hypothyroidism 04/19/2013  . Degenerative arthritis of hip 02/25/2012   Past Medical History:  Diagnosis Date  . Arthritis    OSTEOARTHRITIS   -- CONSTANT PAIN RIGHT HIP---AND PAIN LEFT KNEE--PT STATES SHE GETS INJECTIONS INTO HER KNEE  . Complication of anesthesia    BLOOD PRESSURE DROPPED WITH NASAL SURGERY, ONE OF THE CARPAL TUNNEL REPAIRS AND DURING A COLONOSCOPY  . Dyslipidemia   . History of skin cancer   . Hypertension   . Hypothyroidism   . Menopausal symptoms   . NSTEMI (non-ST elevated myocardial infarction) ([place]) 12/09/15   Medical management: Distal branch of D1 95%, ostial D2 75%, distal LAD 50%. Tortuous arteries consistent with hypertension  . Ovarian cancer ([place]) 01/2013   Recurrence since 2014/2016  . PAF (paroxysmal atrial fibrillation) ([place])    On ELIQUIS; On Amiodarone (Eye Exam 12/25/15) - no longer on Rythmol    Family History  Problem Relation Age of Onset  . Hypertension Mother   . Basal cell carcinoma Mother   . AAA (abdominal aortic aneurysm) Father   . Heart Problems Maternal Uncle   . Heart Problems Maternal Grandfather   . AAA (abdominal aortic aneurysm) Paternal Grandmother   . Prostate  cancer Maternal Uncle 70  . Prostate cancer Other   . Other Daughter        one daughter had TAH-BSO at 56; other daughter will have one soon    Past Surgical History:  Procedure Laterality Date  . BILATERAL CARPAL TUNNEL REPAIR  2007  . BREAST BIOPSY Left 03/19/2014   benign  . CARDIAC CATHETERIZATION N/A 12/05/2015   Procedure: Left Heart Cath and Coronary Angiography;  Surgeon: Belva Crome, MD;  Location: [facility];  Service: Cardiovascular;  distal branch of D1 95%, ostial D2 75%, dLAD 50%,p-mRCA 40%. Tortuous vessels.  Marland Kitchen DILATION AND CURETTAGE OF UTERUS  1969  . LAPAROTOMY Bilateral 02/13/2013   Procedure: EXPLORATORY LAPAROTOMY TOTAL ABDOMINAL HYSTERECTOMY BILATERAL SALPINGO-OOPHORECTOMY, Partial Rectal Resection with Reanastamosis;  Surgeon: Alvino Chapel, MD;  [facility];  Service: Gynecology;  Laterality: Bilateral;  . LEFT KNEE ARTHROSCOPY   2011  . LYMPHADENECTOMY Right 02/13/2013   Procedure: PEVLIC  LYMPHADENECTOMY, DEBULKING right pelvic tumor nodules;  Surgeon: Alvino Chapel, MD;  [facility];  Service: Gynecology;  Laterality: Right;  . NM MYOVIEW LTD  June 2010    subbmaximal with no ischemia or infarction.  . OMENTECTOMY  02/13/2013  Procedure: OMENTECTOMY;  Surgeon: Alvino Chapel, MD;  [facility];  Service: Gynecology;;  . [place]  . SURGERY FOR RUPTURED OVARIAN CYST  1969  . SYNOVECTOMY WITH POLY EXCHANGE Left 03/18/2017   Procedure: LEFT KNEE PARTIAL SYNOVECTOMY WITH POLY EXCHANGE;  Surgeon: Mcarthur Rossetti, MD;  [facility];  Service: Orthopedics;  Laterality: Left;  Adductor Block  . TAH/BSO/Tumor debulking with right pelvic LND  01/2013  . TONSILLECTOMY  1962  . TOTAL HIP ARTHROPLASTY  02/25/2012   Procedure: TOTAL HIP ARTHROPLASTY ANTERIOR APPROACH;  Surgeon: Mcarthur Rossetti, MD;  [facility];  Service: Orthopedics;  Laterality: Right;  . TOTAL KNEE  ARTHROPLASTY Left 01/03/2015   Procedure: LEFT TOTAL KNEE ARTHROPLASTY;  Surgeon: Mcarthur Rossetti, MD;  [facility];  Service: Orthopedics;  Laterality: Left;  . TRANSTHORACIC ECHOCARDIOGRAM  May 2014; January 2016   a. Normal LV size function. EF 60-65%. Grade 1 diastolic function. Mild MR and mildly elevated PA pressures of 37 mmHg per;; b. EF 60-65%. Mobile echodensity 10 mm x 6 mm attest interventricular septum is questionable fibroblastoma.   Social History   Occupational History  . Not on file.   Social History Main Topics  . Smoking status: Never Smoker  . Smokeless tobacco: Never Used  . Alcohol use No  . Drug use: No  . Sexual activity: Not Currently

## 2017-08-21 ENCOUNTER — Other Ambulatory Visit (INDEPENDENT_AMBULATORY_CARE_PROVIDER_SITE_OTHER): Payer: Self-pay | Admitting: Orthopaedic Surgery

## 2017-08-22 NOTE — Telephone Encounter (Signed)
Please advise 

## 2017-08-29 ENCOUNTER — Ambulatory Visit (INDEPENDENT_AMBULATORY_CARE_PROVIDER_SITE_OTHER): Payer: Medicare Other

## 2017-08-29 ENCOUNTER — Ambulatory Visit (INDEPENDENT_AMBULATORY_CARE_PROVIDER_SITE_OTHER): Payer: Medicare Other | Admitting: Orthopaedic Surgery

## 2017-08-29 DIAGNOSIS — M545 Low back pain, unspecified: Secondary | ICD-10-CM | POA: Insufficient documentation

## 2017-08-29 DIAGNOSIS — G8929 Other chronic pain: Secondary | ICD-10-CM | POA: Insufficient documentation

## 2017-08-29 DIAGNOSIS — S52124D Nondisplaced fracture of head of right radius, subsequent encounter for closed fracture with routine healing: Secondary | ICD-10-CM

## 2017-08-29 DIAGNOSIS — M25521 Pain in right elbow: Secondary | ICD-10-CM

## 2017-08-29 NOTE — Progress Notes (Signed)
Office Visit Note   Patient: [name]           Date of Birth: [birth year]           MRN: [id number] Visit Date: 08/29/2017              Requested by: Shon Baton, [place], [place] [id number] PCP: Shon Baton, MD   Assessment & Plan: Visit Diagnoses:  1. Right elbow pain   2. Closed nondisplaced fracture of head of right radius with routine healing, subsequent encounter   3. Chronic right-sided low back pain without sciatica     Plan: She is interested in lumbar spine injections again by Dr. Ernestina Patches. However her symptoms used to be left-sided and now the right sided. An MRI is warranted to help determine the best level for performing right-sided injections. As far as her elbow ago shows continued increase her activity to the elbow we do not need x-rays again. We'll see her back in 2 weeks no for she'll have an MRI of her lumbar spine and we can direct her treatment further.  Follow-Up Instructions: Return in about 2 weeks (around 09/12/2017).   Orders:  Orders Placed This Encounter  Procedures  . XR Elbow 2 Views Right   No orders of the defined types were placed in this encounter.     Procedures: No procedures performed   Clinical Data: No additional findings.   Subjective: No chief complaint on file. The patient is well-known to [place]. She is coming in today for follow-up of her lumbar spine. However in the interim she has had a fall injuring her right elbow and has a known radial head fracture on the right side. She is followed by one of our partners for this but would like to continue see me for elbow as well since she is already seeing me for her back. She has had epidural steroid injections in the past by my partner Dr. Ernestina Patches this was years ago. Her last MRI of her back was in 2015. A steroid taper is helped some but she still having significant right-sided back pain with no radicular symptoms. X-rays recently shows severe lumbar degenerative  scoliosis. She still has weakness in her legs. She does report right elbow pain and forearm pain from her recent fall or fracture. She says her left knee that were performed a polyliner liner exchange is doing great.  HPI  Review of Systems He currently denies any headache, chest pain, short of breath, fever, chills, vomiting, vomiting  Objective: Vital Signs: There were no vitals taken for this visit.  Physical Exam She is alert or 3 and in no acute distress Ortho Exam Examination of her right elbow does show bruising. There is pain over the radial head. Her flexion is full and her extension is almost full she has full rotation. There is deathly pain. She has limited flexion extension of her lumbar spine mainly just due to pain but she does actually has good motion in general. She has significant right-sided tenderness on the paraspinal muscles in the midline. Specialty Comments:  No specialty comments available.  Imaging: Xr Elbow 2 Views Right  Result Date: 08/29/2017 2 views of the right elbow show well located elbow. There is a nondisplaced fracture of the radial head with 2 visible fracture lines so thus a three-part fracture. There is no other acute injuries of the elbow other than the right radial head fracture.    PMFS History: Patient Active  Problem List   Diagnosis Date Noted  . Chronic right-sided low back pain without sciatica 08/29/2017  . Closed nondisplaced fracture of head of right radius 08/15/2017  . Failed total left knee replacement ([place]) 03/18/2017  . Status post revision of total knee replacement, left 03/18/2017  . Thrombocytopenia ([place]) 03/27/2016  . High risk medications (not anticoagulants) long-term use 03/27/2016  . Chronic anticoagulation 03/27/2016  . Atherosclerotic heart disease of native coronary artery without angina pectoris 03/03/2016  . Bilateral hearing loss 02/16/2016  . Bilateral impacted cerumen 02/16/2016  . Rhinitis, chronic 02/16/2016   . Embolic stroke ([place]) 05/39/7673  . Vaginal atrophy 12/29/2015  . On amiodarone therapy 12/23/2015  . Dyslipidemia, goal LDL below 70   . Stroke with cerebral ischemia ([place])   . Visual disturbance   . Cerebral thrombosis with cerebral infarction 12/08/2015  . NSTEMI (non-ST elevated myocardial infarction) ([place]) 12/04/2015  . 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 Chemotherapy-induced peripheral neuropathy ([place]) 07/23/2015  . Portacath in place 07/23/2015  . Recurrent carcinoma of ovary ([place]) 07/23/2015  . International Federation of Gynecology and Obstetrics (FIGO) 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[place]) 07/23/2015  . Genetic testing 07/14/2015  . Encounter for antineoplastic chemotherapy 06/25/2015  . Port catheter in place 06/25/2015  . Antineoplastic chemotherapy induced pancytopenia ([place]) 06/02/2015  . Dehydration 06/02/2015  . 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 Chemotherapy induced neutropenia ([place]) 05/13/2015  . Chemotherapy induced thrombocytopenia 05/13/2015  . Hyperbilirubinemia 05/06/2015  . Osteoarthritis of left knee 01/03/2015  . Status post total left knee replacement 01/03/2015  . PAF (paroxysmal atrial fibrillation) ([place]): Symptomatic - Rhythm control with Amiodarone. CHA2DS2Vasc Score 7: On Eliquis   . Fever 05/03/2013    Class: Acute  . Nausea 05/03/2013    Class: Acute  . Constipation 05/03/2013    Class: Acute  . Syncope, history of 04/19/2013  . Essential hypertension 04/19/2013  . Hypothyroidism 04/19/2013  . Degenerative arthritis of hip 02/25/2012   Past Medical History:  Diagnosis Date  . Arthritis    OSTEOARTHRITIS   -- CONSTANT PAIN RIGHT HIP---AND PAIN LEFT KNEE--PT STATES SHE GETS INJECTIONS INTO HER KNEE  . Complication of anesthesia    BLOOD PRESSURE DROPPED WITH NASAL SURGERY, ONE OF THE CARPAL TUNNEL REPAIRS AND DURING A COLONOSCOPY  . Dyslipidemia   . History of skin cancer   .  Hypertension   . Hypothyroidism   . Menopausal symptoms   . NSTEMI (non-ST elevated myocardial infarction) ([place]) 12/09/15   Medical management: Distal branch of D1 95%, ostial D2 75%, distal LAD 50%. Tortuous arteries consistent with hypertension  . Ovarian cancer ([place]) 01/2013   Recurrence since 2014/2016  . PAF (paroxysmal atrial fibrillation) ([place])    On ELIQUIS; On Amiodarone (Eye Exam 12/25/15) - no longer on Rythmol    Family History  Problem Relation Age of Onset  . Hypertension Mother   . Basal cell carcinoma Mother   . AAA (abdominal aortic aneurysm) Father   . Heart Problems Maternal Uncle   . Heart Problems Maternal Grandfather   . AAA (abdominal aortic aneurysm) Paternal Grandmother   . Prostate cancer Maternal Uncle 70  . Prostate cancer Other   . Other Daughter        one daughter had TAH-BSO at 67; other daughter will have one soon    Past Surgical History:  Procedure Laterality Date  . BILATERAL CARPAL TUNNEL REPAIR  2007  . BREAST BIOPSY  Left 03/19/2014   benign  . CARDIAC CATHETERIZATION N/A 12/05/2015   Procedure: Left Heart Cath and Coronary Angiography;  Surgeon: Belva Crome, MD;  Location: [facility];  Service: Cardiovascular;  distal branch of D1 95%, ostial D2 75%, dLAD 50%,p-mRCA 40%. Tortuous vessels.  Marland Kitchen DILATION AND CURETTAGE OF UTERUS  1969  . LAPAROTOMY Bilateral 02/13/2013   Procedure: EXPLORATORY LAPAROTOMY TOTAL ABDOMINAL HYSTERECTOMY BILATERAL SALPINGO-OOPHORECTOMY, Partial Rectal Resection with Reanastamosis;  Surgeon: Alvino Chapel, MD;  Location: [facility];  Service: Gynecology;  Laterality: Bilateral;  . LEFT KNEE ARTHROSCOPY   2011  . LYMPHADENECTOMY Right 02/13/2013   Procedure: PEVLIC  LYMPHADENECTOMY, DEBULKING right pelvic tumor nodules;  Surgeon: Alvino Chapel, MD;  Location: [facility];  Service: Gynecology;  Laterality: Right;  . NM MYOVIEW LTD  June 2010    subbmaximal with no ischemia or infarction.  . OMENTECTOMY   02/13/2013   Procedure: OMENTECTOMY;  Surgeon: Alvino Chapel, MD;  Location: [facility];  Service: Gynecology;;  . [place]  . SURGERY FOR RUPTURED OVARIAN CYST  1969  . SYNOVECTOMY WITH POLY EXCHANGE Left 03/18/2017   Procedure: LEFT KNEE PARTIAL SYNOVECTOMY WITH POLY EXCHANGE;  Surgeon: Mcarthur Rossetti, MD;  Location: [facility];  Service: Orthopedics;  Laterality: Left;  Adductor Block  . TAH/BSO/Tumor debulking with right pelvic LND  01/2013  . TONSILLECTOMY  1962  . TOTAL HIP ARTHROPLASTY  02/25/2012   Procedure: TOTAL HIP ARTHROPLASTY ANTERIOR APPROACH;  Surgeon: Mcarthur Rossetti, MD;  Location: [facility];  Service: Orthopedics;  Laterality: Right;  . TOTAL KNEE ARTHROPLASTY Left 01/03/2015   Procedure: LEFT TOTAL KNEE ARTHROPLASTY;  Surgeon: Mcarthur Rossetti, MD;  [facility]: [facility];  Service: Orthopedics;  Laterality: Left;  . TRANSTHORACIC ECHOCARDIOGRAM  May 2014; January 2016   a. Normal LV size function. EF 60-65%. Grade 1 diastolic function. Mild MR and mildly elevated PA pressures of 37 mmHg per;; b. EF 60-65%. Mobile echodensity 10 mm x 6 mm attest interventricular septum is questionable fibroblastoma.   Social History   Occupational History  . Not on file.   Social History Main Topics  . Smoking status: Never Smoker  . Smokeless tobacco: Never Used  . Alcohol use No  . Drug use: No  . Sexual activity: Not Currently

## 2017-08-30 ENCOUNTER — Other Ambulatory Visit (INDEPENDENT_AMBULATORY_CARE_PROVIDER_SITE_OTHER): Payer: Self-pay

## 2017-08-30 DIAGNOSIS — M4807 Spinal stenosis, lumbosacral region: Secondary | ICD-10-CM

## 2017-09-01 ENCOUNTER — Ambulatory Visit (HOSPITAL_BASED_OUTPATIENT_CLINIC_OR_DEPARTMENT_OTHER): Payer: Medicare Other

## 2017-09-01 ENCOUNTER — Other Ambulatory Visit: Payer: Medicare Other

## 2017-09-01 DIAGNOSIS — Z95828 Presence of other vascular implants and grafts: Secondary | ICD-10-CM

## 2017-09-01 DIAGNOSIS — C561 Malignant neoplasm of right ovary: Secondary | ICD-10-CM

## 2017-09-01 DIAGNOSIS — C569 Malignant neoplasm of unspecified ovary: Secondary | ICD-10-CM

## 2017-09-01 DIAGNOSIS — C7989 Secondary malignant neoplasm of other specified sites: Secondary | ICD-10-CM

## 2017-09-01 MED ORDER — HEPARIN SOD (PORK) LOCK FLUSH 100 UNIT/ML IV SOLN
500.0000 [IU] | Freq: Once | INTRAVENOUS | Status: AC
  Administered 2017-09-01: 500 [IU] via INTRAVENOUS
  Filled 2017-09-01: qty 5

## 2017-09-01 MED ORDER — SODIUM CHLORIDE 0.9% FLUSH
10.0000 mL | INTRAVENOUS | Status: DC | PRN
  Administered 2017-09-01: 10 mL via INTRAVENOUS
  Filled 2017-09-01: qty 10

## 2017-09-01 NOTE — Patient Instructions (Signed)
Implanted Port Home Guide An implanted port is a type of central line that is placed under the skin. Central lines are used to provide IV access when treatment or nutrition needs to be given through a person's veins. Implanted ports are used for long-term IV access. An implanted port may be placed because:  You need IV medicine that would be irritating to the small veins in your hands or arms.  You need long-term IV medicines, such as antibiotics.  You need IV nutrition for a long period.  You need frequent blood draws for lab tests.  You need dialysis.  Implanted ports are usually placed in the chest area, but they can also be placed in the upper arm, the abdomen, or the leg. An implanted port has two main parts:  Reservoir. The reservoir is round and will appear as a small, raised area under your skin. The reservoir is the part where a needle is inserted to give medicines or draw blood.  Catheter. The catheter is a thin, flexible tube that extends from the reservoir. The catheter is placed into a large vein. Medicine that is inserted into the reservoir goes into the catheter and then into the vein.  How will I care for my incision site? Do not get the incision site wet. Bathe or shower as directed by your health care provider. How is my port accessed? Special steps must be taken to access the port:  Before the port is accessed, a numbing cream can be placed on the skin. This helps numb the skin over the port site.  Your health care provider uses a sterile technique to access the port. ? Your health care provider must put on a mask and sterile gloves. ? The skin over your port is cleaned carefully with an antiseptic and allowed to dry. ? The port is gently pinched between sterile gloves, and a needle is inserted into the port.  Only "non-coring" port needles should be used to access the port. Once the port is accessed, a blood return should be checked. This helps ensure that the port  is in the vein and is not clogged.  If your port needs to remain accessed for a constant infusion, a clear (transparent) bandage will be placed over the needle site. The bandage and needle will need to be changed every week, or as directed by your health care provider.  Keep the bandage covering the needle clean and dry. Do not get it wet. Follow your health care provider's instructions on how to take a shower or bath while the port is accessed.  If your port does not need to stay accessed, no bandage is needed over the port.  What is flushing? Flushing helps keep the port from getting clogged. Follow your health care provider's instructions on how and when to flush the port. Ports are usually flushed with saline solution or a medicine called heparin. The need for flushing will depend on how the port is used.  If the port is used for intermittent medicines or blood draws, the port will need to be flushed: ? After medicines have been given. ? After blood has been drawn. ? As part of routine maintenance.  If a constant infusion is running, the port may not need to be flushed.  How long will my port stay implanted? The port can stay in for as long as your health care provider thinks it is needed. When it is time for the port to come out, surgery will be   done to remove it. The procedure is similar to the one performed when the port was put in. When should I seek immediate medical care? When you have an implanted port, you should seek immediate medical care if:  You notice a bad smell coming from the incision site.  You have swelling, redness, or drainage at the incision site.  You have more swelling or pain at the port site or the surrounding area.  You have a fever that is not controlled with medicine.  This information is not intended to replace advice given to you by your health care provider. Make sure you discuss any questions you have with your health care provider. Document  Released: 11/15/2005 Document Revised: 04/22/2016 Document Reviewed: 07/23/2013 Elsevier Interactive Patient Education  2017 Elsevier Inc.  

## 2017-09-02 LAB — CA 125: Cancer Antigen (CA) 125: 40.9 U/mL — ABNORMAL HIGH (ref 0.0–38.1)

## 2017-09-05 ENCOUNTER — Ambulatory Visit: Payer: Medicare Other | Attending: Gynecology | Admitting: Gynecology

## 2017-09-05 ENCOUNTER — Encounter: Payer: Self-pay | Admitting: Gynecology

## 2017-09-05 VITALS — BP 148/66 | HR 70 | Temp 98.2°F | Resp 18 | Ht 63.0 in | Wt 135.6 lb

## 2017-09-05 DIAGNOSIS — Z96652 Presence of left artificial knee joint: Secondary | ICD-10-CM | POA: Insufficient documentation

## 2017-09-05 DIAGNOSIS — I10 Essential (primary) hypertension: Secondary | ICD-10-CM | POA: Insufficient documentation

## 2017-09-05 DIAGNOSIS — Z90722 Acquired absence of ovaries, bilateral: Secondary | ICD-10-CM | POA: Insufficient documentation

## 2017-09-05 DIAGNOSIS — Z7901 Long term (current) use of anticoagulants: Secondary | ICD-10-CM | POA: Insufficient documentation

## 2017-09-05 DIAGNOSIS — Z8543 Personal history of malignant neoplasm of ovary: Secondary | ICD-10-CM

## 2017-09-05 DIAGNOSIS — E039 Hypothyroidism, unspecified: Secondary | ICD-10-CM | POA: Diagnosis not present

## 2017-09-05 DIAGNOSIS — Z888 Allergy status to other drugs, medicaments and biological substances status: Secondary | ICD-10-CM | POA: Insufficient documentation

## 2017-09-05 DIAGNOSIS — C569 Malignant neoplasm of unspecified ovary: Secondary | ICD-10-CM | POA: Insufficient documentation

## 2017-09-05 DIAGNOSIS — Z90710 Acquired absence of both cervix and uterus: Secondary | ICD-10-CM | POA: Insufficient documentation

## 2017-09-05 DIAGNOSIS — N951 Menopausal and female climacteric states: Secondary | ICD-10-CM | POA: Diagnosis not present

## 2017-09-05 DIAGNOSIS — R971 Elevated cancer antigen 125 [CA 125]: Secondary | ICD-10-CM | POA: Diagnosis not present

## 2017-09-05 DIAGNOSIS — I480 Paroxysmal atrial fibrillation: Secondary | ICD-10-CM | POA: Diagnosis not present

## 2017-09-05 DIAGNOSIS — Z885 Allergy status to narcotic agent status: Secondary | ICD-10-CM | POA: Insufficient documentation

## 2017-09-05 DIAGNOSIS — Z85828 Personal history of other malignant neoplasm of skin: Secondary | ICD-10-CM | POA: Insufficient documentation

## 2017-09-05 DIAGNOSIS — Z96641 Presence of right artificial hip joint: Secondary | ICD-10-CM | POA: Diagnosis not present

## 2017-09-05 DIAGNOSIS — Z79811 Long term (current) use of aromatase inhibitors: Secondary | ICD-10-CM

## 2017-09-05 DIAGNOSIS — I252 Old myocardial infarction: Secondary | ICD-10-CM | POA: Insufficient documentation

## 2017-09-05 DIAGNOSIS — M1990 Unspecified osteoarthritis, unspecified site: Secondary | ICD-10-CM | POA: Diagnosis not present

## 2017-09-05 DIAGNOSIS — E785 Hyperlipidemia, unspecified: Secondary | ICD-10-CM | POA: Insufficient documentation

## 2017-09-05 DIAGNOSIS — Z9221 Personal history of antineoplastic chemotherapy: Secondary | ICD-10-CM | POA: Insufficient documentation

## 2017-09-05 DIAGNOSIS — C786 Secondary malignant neoplasm of retroperitoneum and peritoneum: Secondary | ICD-10-CM | POA: Diagnosis not present

## 2017-09-05 DIAGNOSIS — Z8249 Family history of ischemic heart disease and other diseases of the circulatory system: Secondary | ICD-10-CM | POA: Diagnosis not present

## 2017-09-05 DIAGNOSIS — Z79899 Other long term (current) drug therapy: Secondary | ICD-10-CM | POA: Insufficient documentation

## 2017-09-05 NOTE — Progress Notes (Signed)
Consult Note: Gyn-Onc   [name] 76 y.o. female  Chief Complaint  Patient presents with  . 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 Currently taking Aromisin 25 mg daily. She is tolerating the current regimen  well except for hot flushes. CA-125 was 40 (previously 32).  Given that the patient is asymptomatic and her CA-125 is relatively stable, we will continue the current regimen for 2 more months and repeat a CA-125.  Interval History:   The patient returns today as previously scheduled.  She is tolerating the Aromisin very well except for some hot flashes. Otherwise she has no symptoms associated with ovarian cancer. Her recent CA-125 was 40 (32) The patient denies any GI, GU or pelvic symptoms.  She recently celebrated her 50th wedding anniversary.  HPI:The patient initially presented with a pelvic mass and elevated CA 125 (178 units per mL) She underwent exploratory laparotomy and debulking on 02/13/2013. Final pathology showed a poorly differentiated ovarian cancer involving both ovaries pelvic peritoneum and rectal muscularis. All gross disease was resected.  She then received 6 cycles of carboplatin and Taxol chemotherapy completed in September 2014. At the completion of chemotherapy her CA 125 was 18 units per mL.   In May 2016 the patient CA-125 began to rise and a PET CT scan showed metastatic disease in the spleen and pelvic lymph nodes. Given the long platinum free interval , we retreated with carboplatin, Taxol, and a Avastin. She received 6 cycles of carboplatin and Taxol and a Avastin the last being administered in September 2016. She had a significant response with CA 125 falling to 12 units per mL in September. CT scan showed persistent disease in the spleen (which was actually responding). Patient received a total 8 cycles and on follow-up in December 2016 had  persistent disease in the spleen which had slightly decreased in size. At that time CA-125 is 14 units per mL.  In March 2017 we switch the patient to letrozole.   A CT scan obtained 06/23/2016 showed a response with the spleen met measure 1.5 x 1.5 cm (previously 2.4 x 3.9 cm). Because of side effects resolve was discontinued in November 2017. A follow-up CT scan on 12/01/2016 showed no evidence of the splenic lesion and her CA-125 at that time was 14 units per mL.  In May 2018 CA-125 rose to 24 units per mL and a follow-up CT scan showed new lesions in the gastrosplenic ligament ( 2.5 cm) and a lesion in the left pelvis measuring 2.4 cm. Because she had previously responded to Letrozole ( although she did not tolerate let resolve very well) we began Aromasin 25 mg daily.   Review of Systems:10 point review of systems is negative except as noted in interval history.   Vitals: Blood pressure (!) 148/66, pulse 70, temperature 98.2 F (36.8 C), temperature source Oral, resp. rate 18, height 5' 3" (1.6 m), weight 135 lb 9.6 oz (61.5 kg), SpO2 100 %.  Physical Exam: General : The patient is a healthy woman in no acute distress.  HEENT: normocephalic, extraoccular movements normal; neck is supple without thyromegally , Port-A-Cath appears healthy is no evidence of infection in the right chest wall. Lynphnodes: Supraclavicular and inguinal nodes not enlarged  Abdomen: Soft, non-tender, no ascites, no organomegally, no masses, no hernias , midline incision is healing well  Pelvic:    EGBUS: Normal female  Vagina bladder   urethra: Normal  Vaginal cuff is well healed.  Cervix and uterus are surgically absent  Bimanual exam: No masses nodularity or fullness.  Rectovaginal exam confirms      Lower extremities: No edema or varicosities. Normal range of motion      Allergies  Allergen Reactions  . Clonidine Derivatives Shortness Of Breath  . Coreg [Carvedilol] Shortness Of Breath  .  Caffeine Other (See Comments)    Makes heart race  . Codeine Other (See Comments)    Does not like the feeling she gets  . Flexeril [Cyclobenzaprine] Other (See Comments)    Pt states "increased heart rate"  . Lipitor [Atorvastatin] Dermatitis    "feels like bugs are biting her"   . Lisinopril     LIP NUMBNESS  . Pravastatin Other (See Comments)    FEELS LIKE "BUG BITES" BITING HER LEGS   . Tape Rash    TEGADERM.   (use opsite on PAC)  . Tegaderm Ag Mesh [Silver] Rash and Other (See Comments)    Burns skin  . Ultram [Tramadol] Palpitations    Past Medical History:  Diagnosis Date  . Arthritis    OSTEOARTHRITIS   -- CONSTANT PAIN RIGHT HIP---AND PAIN LEFT KNEE--PT STATES SHE GETS INJECTIONS INTO HER KNEE  . Complication of anesthesia    BLOOD PRESSURE DROPPED WITH NASAL SURGERY, ONE OF THE CARPAL TUNNEL REPAIRS AND DURING A COLONOSCOPY  . Dyslipidemia   . History of skin cancer   . Hypertension   . Hypothyroidism   . Menopausal symptoms   . NSTEMI (non-ST elevated myocardial infarction) (HCC) 12/09/15   Medical management: Distal branch of D1 95%, ostial D2 75%, distal LAD 50%. Tortuous arteries consistent with hypertension  . Ovarian cancer (HCC) 01/2013   Recurrence since 2014/2016  . PAF (paroxysmal atrial fibrillation) (HCC)    On ELIQUIS; On Amiodarone (Eye Exam 12/25/15) - no longer on Rythmol    Past Surgical History:  Procedure Laterality Date  . BILATERAL CARPAL TUNNEL REPAIR  2007  . BREAST BIOPSY Left 03/19/2014   benign  . CARDIAC CATHETERIZATION N/A 12/05/2015   Procedure: Left Heart Cath and Coronary Angiography;  Surgeon: Henry W Smith, MD;  Location: [facility];  Service: Cardiovascular;  distal branch of D1 95%, ostial D2 75%, dLAD 50%,p-mRCA 40%. Tortuous vessels.  . DILATION AND CURETTAGE OF UTERUS  1969  . LAPAROTOMY Bilateral 02/13/2013   Procedure: EXPLORATORY LAPAROTOMY TOTAL ABDOMINAL HYSTERECTOMY BILATERAL SALPINGO-OOPHORECTOMY, Partial Rectal  Resection with Reanastamosis;  Surgeon:  L Clarke-Pearson, MD;  Location: [facility];  Service: Gynecology;  Laterality: Bilateral;  . LEFT KNEE ARTHROSCOPY   2011  . LYMPHADENECTOMY Right 02/13/2013   Procedure: PEVLIC  LYMPHADENECTOMY, DEBULKING right pelvic tumor nodules;  Surgeon:  L Clarke-Pearson, MD;  [facility];  Service: Gynecology;  Laterality: Right;  . NM MYOVIEW LTD  June 2010    subbmaximal with no ischemia or infarction.  . OMENTECTOMY  02/13/2013   Procedure: OMENTECTOMY;  Surgeon:  L Clarke-Pearson, MD;  [facility]: [facility];  Service: Gynecology;;  . RHINOPLASTY FOR FRACTURED NOSE  1986  . SURGERY FOR RUPTURED OVARIAN CYST  1969  . SYNOVECTOMY WITH POLY EXCHANGE Left 03/18/2017   Procedure: LEFT KNEE PARTIAL SYNOVECTOMY WITH POLY EXCHANGE;  Surgeon: Christopher Y Blackman, MD;  [facility];  Service: Orthopedics;  Laterality: Left;  Adductor Block  . TAH/BSO/Tumor debulking with right pelvic LND  01/2013  . TONSILLECTOMY  1962  . TOTAL HIP ARTHROPLASTY  02/25/2012     Procedure: TOTAL HIP ARTHROPLASTY ANTERIOR APPROACH;  Surgeon: Mcarthur Rossetti, MD;  [facility];  Service: Orthopedics;  Laterality: Right;  . TOTAL KNEE ARTHROPLASTY Left 01/03/2015   Procedure: LEFT TOTAL KNEE ARTHROPLASTY;  Surgeon: Mcarthur Rossetti, MD;  [facility];  Service: Orthopedics;  Laterality: Left;  . TRANSTHORACIC ECHOCARDIOGRAM  May 2014; January 2016   a. Normal LV size function. EF 60-65%. Grade 1 diastolic function. Mild MR and mildly elevated PA pressures of 37 mmHg per;; b. EF 60-65%. Mobile echodensity 10 mm x 6 mm attest interventricular septum is questionable fibroblastoma.    Current Outpatient Prescriptions  Medication Sig Dispense Refill  . acetaminophen (TYLENOL) 500 MG tablet Take 1,000 mg by mouth every 6 (six) hours as needed for mild pain.     Marland Kitchen amiodarone (PACERONE) 200 MG tablet Take 100 mg by mouth daily.  3  . desonide (DESOWEN) 0.05 %  cream Apply 1 application topically 2 (two) times daily.   1  . diphenhydrAMINE (BENADRYL) 25 MG tablet Take 50 mg by mouth at bedtime as needed for sleep.     Marland Kitchen docusate sodium (COLACE) 100 MG capsule Take 250 mg by mouth 2 (two) times daily.     Marland Kitchen ELIQUIS 5 MG TABS tablet TAKE 1 TABLET (5 MG TOTAL) BY MOUTH 2 (TWO) TIMES DAILY. 60 tablet 5  . Evolocumab with Infusor ([place]) 420 MG/3.5ML SOCT Inject 420 mg into the skin every 30 (thirty) days. 1 Cartridge 12  . Evolocumab with Infusor 420 MG/3.5ML SOCT Inject into the skin.    Marland Kitchen exemestane (AROMASIN) 25 MG tablet Take 1 tablet (25 mg total) by mouth daily after breakfast. 30 tablet 6  . ezetimibe (ZETIA) 10 MG tablet Take 1 tablet (10 mg total) by mouth daily. (Patient taking differently: Take 10 mg by mouth at bedtime. ) 30 tablet 6  . Glucosamine HCl 1000 MG TABS Take 1,000 mg by mouth daily.    . Glucosamine Sulfate 1000 MG TABS Take by mouth.    . hydrocortisone 1 % ointment Apply 1 application topically 2 (two) times daily as needed for itching (eczema).     Marland Kitchen ipratropium (ATROVENT) 0.06 % nasal spray Place 2 sprays into both nostrils daily.   5  . irbesartan (AVAPRO) 300 MG tablet TAKE 1 TABLET (300 MG TOTAL) BY MOUTH DAILY. (Patient taking differently: TAKE 1/2 TABLET (300 MG TOTAL) BY MOUTH every evening) 90 tablet 1  . levothyroxine (SYNTHROID) 88 MCG tablet Take 88 mcg by mouth daily before breakfast.    . lidocaine-prilocaine (EMLA) cream Apply to Porta-Cath site 1-2 hours prior to access as directed. (Patient taking differently: Apply 1 application topically See admin instructions. Apply to [facility] site 1-2 hours prior to access as directed.) 30 g 2  . magnesium hydroxide (MILK OF MAGNESIA) 800 MG/5ML suspension Take 30 mLs by mouth daily as needed for constipation. Reported on 11/20/2015    . Melatonin 5 MG CAPS Take 5 mg by mouth at bedtime as needed (sleep). Reported on 12/25/2015    . Menthol, Topical  Analgesic, (ICY HOT EX) Apply 1 application topically 2 (two) times daily as needed (PAIN).     Marland Kitchen methocarbamol (ROBAXIN) 500 MG tablet TAKE 1 TABLET BY MOUTH EVERY 6 HOURS AS NEEDED FOR MUSCLE SPASMS 60 tablet 0  . methylPREDNISolone (MEDROL) 4 MG tablet Take as directed 21 tablet 0  . Misc Natural Products (TART CHERRY ADVANCED) CAPS Take 1 capsule by mouth daily. 1200 mg  per capsule    . Multiple Vitamin (MULTIVITAMIN WITH MINERALS) TABS tablet Take 1 tablet by mouth daily. Centrum Silver    . Polyethyl Glycol-Propyl Glycol (SYSTANE OP) Place 1 drop into both eyes 2 (two) times daily.     . polyethylene glycol (MIRALAX / GLYCOLAX) packet Take 17 g by mouth daily. Mix in 8 oz liquid and drink    . potassium chloride (K-DUR) 10 MEQ tablet Take 10 mEq by mouth every Monday, Wednesday, and Friday.    . SHINGRIX injection     . spironolactone (ALDACTONE) 25 MG tablet Take 25 mg by mouth See admin instructions. Takes on Tuesdays, Thursday, Saturday, Sunday    . amoxicillin (AMOXIL) 500 MG capsule Take 2,000 mg by mouth See admin instructions. Takes 4 capsules 1 hour prior to procedure    . Biotin 10 MG TABS Take 10 mg by mouth daily.      No current facility-administered medications for this visit.     Social History   Social History  . Marital status: Married    Spouse name: N/A  . Number of children: N/A  . Years of education: N/A   Occupational History  . Not on file.   Social History Main Topics  . Smoking status: Never Smoker  . Smokeless tobacco: Never Used  . Alcohol use No  . Drug use: No  . Sexual activity: Not Currently   Other Topics Concern  . Not on file   Social History Narrative   Married mother of 3, grandmother 5.   Exercises 6/7 days a week walking 30 minutes a time.   Never smoked or drank alcohol.    Family History  Problem Relation Age of Onset  . Hypertension Mother   . Basal cell carcinoma Mother   . AAA (abdominal aortic aneurysm) Father   . Heart  Problems Maternal Uncle   . Heart Problems Maternal Grandfather   . AAA (abdominal aortic aneurysm) Paternal Grandmother   . Prostate cancer Maternal Uncle 70  . Prostate cancer Other   . Other Daughter        one daughter had TAH-BSO at 44; other daughter will have one soon       Clarke-Pearson, MD 09/05/2017, 10:09 AM        

## 2017-09-05 NOTE — Patient Instructions (Signed)
Plan on having a CA 125 before your appt with Dr. Fermin Schwab in two months. Please call for any needs.

## 2017-09-08 ENCOUNTER — Telehealth: Payer: Self-pay | Admitting: Pharmacist

## 2017-09-08 NOTE — Telephone Encounter (Signed)
Patient reporting new "pimples" since using Repatha. (Last dose 09/01/2017) . Denies rash, itching or swelling.  Had f/u with dermatologist in 2 weeks and will discuss skin lesions with him.    Recommended to hold Repatha until dermatologist assessment completed. Will call back to update.

## 2017-09-12 ENCOUNTER — Ambulatory Visit (INDEPENDENT_AMBULATORY_CARE_PROVIDER_SITE_OTHER): Payer: Medicare Other | Admitting: Orthopaedic Surgery

## 2017-09-13 ENCOUNTER — Ambulatory Visit
Admission: RE | Admit: 2017-09-13 | Discharge: 2017-09-13 | Disposition: A | Discharge status: Home / Self Care | Payer: Medicare Other | Source: Ambulatory Visit | Attending: Orthopaedic Surgery | Admitting: Orthopaedic Surgery

## 2017-09-13 DIAGNOSIS — M4807 Spinal stenosis, lumbosacral region: Secondary | ICD-10-CM

## 2017-09-19 ENCOUNTER — Other Ambulatory Visit: Payer: Self-pay | Admitting: Dermatology

## 2017-09-27 ENCOUNTER — Ambulatory Visit (INDEPENDENT_AMBULATORY_CARE_PROVIDER_SITE_OTHER): Payer: Medicare Other | Admitting: Physician Assistant

## 2017-09-27 DIAGNOSIS — M545 Low back pain, unspecified: Secondary | ICD-10-CM

## 2017-09-27 DIAGNOSIS — G8929 Other chronic pain: Secondary | ICD-10-CM | POA: Diagnosis not present

## 2017-09-27 NOTE — Progress Notes (Signed)
Mrs. Forgione returns to go over MRI of her lobe back.  She reports that she continues to have low back pain worse on the right.  Pain is worse whenever she is lying down on her left side.  She denies any radicular symptoms down either leg.  She has had problems with upper lumbar thoracic pain in the past and undergone epidural steroid injections with Dr. Ernestina Patches and is done well.  She also had a right radial head fracture which she states she is having no pain in her elbow at this point time.    MRI of her lumbar spine is reviewed I reviewed the images.  Results reviewed with the patient using her lumbar spine model.  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  She also had disc degeneration with left foraminal narrowing at L1 to and mild facet degeneration at L to 3 with mild spinal stenosis which is a progress from previous study.  Impression is right lumbar pain without radicular symptoms.  Plan: We will send her to Dr. Ernestina Patches for epidural steroid injection lumbar spine most likely at L4-5.  Have her follow-up with Dr. Ninfa Linden in 1 month to see the efficacy of the injection.

## 2017-09-28 ENCOUNTER — Other Ambulatory Visit (INDEPENDENT_AMBULATORY_CARE_PROVIDER_SITE_OTHER): Payer: Self-pay

## 2017-09-28 DIAGNOSIS — G8929 Other chronic pain: Secondary | ICD-10-CM

## 2017-09-28 DIAGNOSIS — M5441 Lumbago with sciatica, right side: Principal | ICD-10-CM

## 2017-10-10 ENCOUNTER — Ambulatory Visit (INDEPENDENT_AMBULATORY_CARE_PROVIDER_SITE_OTHER): Payer: Self-pay

## 2017-10-10 ENCOUNTER — Encounter (INDEPENDENT_AMBULATORY_CARE_PROVIDER_SITE_OTHER): Payer: Self-pay | Admitting: Physical Medicine and Rehabilitation

## 2017-10-10 ENCOUNTER — Ambulatory Visit (INDEPENDENT_AMBULATORY_CARE_PROVIDER_SITE_OTHER): Payer: Medicare Other | Admitting: Physical Medicine and Rehabilitation

## 2017-10-10 VITALS — BP 142/84 | HR 68 | Temp 98.5°F

## 2017-10-10 DIAGNOSIS — M47816 Spondylosis without myelopathy or radiculopathy, lumbar region: Secondary | ICD-10-CM

## 2017-10-10 DIAGNOSIS — M545 Low back pain, unspecified: Secondary | ICD-10-CM

## 2017-10-10 DIAGNOSIS — G8929 Other chronic pain: Secondary | ICD-10-CM

## 2017-10-10 MED ORDER — METHYLPREDNISOLONE ACETATE 80 MG/ML IJ SUSP
80.0000 mg | Freq: Once | INTRAMUSCULAR | Status: AC
  Administered 2017-10-10: 80 mg

## 2017-10-10 MED ORDER — LIDOCAINE HCL (PF) 1 % IJ SOLN
2.0000 mL | Freq: Once | INTRAMUSCULAR | Status: AC
  Administered 2017-10-10: 2 mL

## 2017-10-10 NOTE — Progress Notes (Deleted)
Right sided lower back pain. Does not radiate. Right hip aches. No numbness or tingling. Getting up and down, turning over in bed make pain worse.

## 2017-10-10 NOTE — Procedures (Signed)
[name] is a 76 year old female with a cyst.  The last time we saw her a few years ago we completed bilateral facet joint blocks with good relief.  She has had epidural injection in the past without as much relief.  She is reporting right-sided axial low back pain with no radiating symptoms down the leg.  We are going to complete a diagnostic and hopefully therapeutic right L4-5 and L5-S1 facet joint block.  MRI from a couple of years ago does show facet arthropathy and significant moderate foraminal narrowing.  The injection  will be diagnostic and hopefully therapeutic. The patient has failed conservative care including time, medications and activity modification.  Lumbar Facet Joint Intra-Articular Injection(s) with Fluoroscopic Guidance  Patient: [name]      Date of Birth: December 10, 1940 MRN: [id number] PCP: Shon Baton, MD      Visit Date: 10/10/2017   Universal Protocol:    Date/Time: 10/10/2017  Consent Given By: the patient  Position: PRONE   Additional Comments: Vital signs were monitored before and after the procedure. Patient was prepped and draped in the usual sterile fashion. The correct patient, procedure, and site was verified.   Injection Procedure Details:  Procedure Site One Meds Administered:  Meds ordered this encounter  Medications  . lidocaine (PF) (XYLOCAINE) 1 % injection 2 mL  . methylPREDNISolone acetate (DEPO-MEDROL) injection 80 mg     Laterality: Right  Location/Site:  L4-L5 L5-S1  Needle size: 22 guage  Needle type: Spinal  Needle Placement: Articular  Findings:  -Contrast Used: 1 mL iohexol 180 mg iodine/mL   -Comments: Excellent flow of contrast producing a partial arthrogram.  Procedure Details: The fluoroscope beam is vertically oriented in AP, and the inferior recess is visualized beneath the lower pole of the inferior apophyseal process, which represents the target point for needle insertion. When direct visualization is  difficult the target point is located at the medial projection of the vertebral pedicle. The region overlying each aforementioned target is locally anesthetized with a 1 to 2 ml. volume of 1% Lidocaine without Epinephrine.   The spinal needle was inserted into each of the above mentioned facet joints using biplanar fluoroscopic guidance. A 0.25 to 0.5 ml. volume of Isovue-250 was injected and a partial facet joint arthrogram was obtained. A single spot film was obtained of the resulting arthrogram.    One to 1.25 ml of the steroid/anesthetic solution was then injected into each of the facet joints noted above.   Additional Comments:  The patient tolerated the procedure well Dressing: Band-Aid    Post-procedure details: Patient was observed during the procedure. Post-procedure instructions were reviewed.  Patient left the clinic in stable condition.

## 2017-10-10 NOTE — Patient Instructions (Signed)

## 2017-10-13 ENCOUNTER — Ambulatory Visit: Payer: Medicare Other | Admitting: Cardiology

## 2017-10-13 ENCOUNTER — Encounter: Payer: Self-pay | Admitting: Cardiology

## 2017-10-13 VITALS — BP 136/78 | HR 76 | Ht 63.0 in | Wt 133.0 lb

## 2017-10-13 DIAGNOSIS — I2510 Atherosclerotic heart disease of native coronary artery without angina pectoris: Secondary | ICD-10-CM | POA: Diagnosis not present

## 2017-10-13 DIAGNOSIS — I10 Essential (primary) hypertension: Secondary | ICD-10-CM | POA: Diagnosis not present

## 2017-10-13 DIAGNOSIS — I639 Cerebral infarction, unspecified: Secondary | ICD-10-CM | POA: Diagnosis not present

## 2017-10-13 DIAGNOSIS — I480 Paroxysmal atrial fibrillation: Secondary | ICD-10-CM | POA: Diagnosis not present

## 2017-10-13 DIAGNOSIS — E785 Hyperlipidemia, unspecified: Secondary | ICD-10-CM

## 2017-10-13 DIAGNOSIS — R55 Syncope and collapse: Secondary | ICD-10-CM

## 2017-10-13 DIAGNOSIS — Z79899 Other long term (current) drug therapy: Secondary | ICD-10-CM | POA: Diagnosis not present

## 2017-10-13 NOTE — Progress Notes (Signed)
PCP: Shon Baton, MD  Clinic Note: Chief Complaint  Patient presents with  . Follow-up    New start [place]  . Atrial Fibrillation    On amiodarone and Eliquis  . Coronary Artery Disease    Small branch vessel.  Medical management    HPI: [name] is a 76 y.o. female with a PMH below who presents today for 8 month f/u CAD-PAF on amiodarone and Eliquis.  Small branch vessel CAD is being treated medically.  [name] was last seen back in March and was doing relatively well.  Quite stable.  Nothing to suggest recurrence of A. Fib. -- was started on Repatha -- had Rosasia reaction, treated with a cream.    Recent Hospitalizations: n/a  Studies Personally Reviewed - (if available, images/films reviewed: From Epic Chart or Care Everywhere)  n/a  Interval History: [name] returns today overall doing well with no major complaints.  She does note occasional "skippping" beats / fluttering sensations, but only really notes 1 sustained episode lasting < 85min.    She was surprised by the reaction to Repatha, but otherwise has not had any issues. She remains stable from a cardiac standpoint with no other complaints. Cardiac Review of Symptoms:  No chest pain or shortness of breath with rest or exertion.  No PND, orthopnea or edema.  No lightheadedness, dizziness, weakness or syncope/near syncope. No TIA/amaurosis fugax symptoms. No melena, hematochezia, hematuria, or epstaxis. No claudication.  ROS: A comprehensive was performed. Review of Systems  Constitutional: Negative for chills, fever and malaise/fatigue.  HENT: Negative for congestion.   Respiratory: Negative for cough, shortness of breath and wheezing.   Gastrointestinal: Negative for constipation, heartburn, nausea and vomiting.  Musculoskeletal: Positive for joint pain (mild OA pains).  Skin:       Per HPI  Neurological: Negative for dizziness.  Endo/Heme/Allergies: Negative for environmental allergies.    Psychiatric/Behavioral: Negative for memory loss. The patient is nervous/anxious. The patient does not have insomnia.   All other systems reviewed and are negative.  I have reviewed and (if needed) personally updated the patient's problem list, medications, allergies, past medical and surgical history, social and family history.   Past Medical History:  Diagnosis Date  . Arthritis    OSTEOARTHRITIS   -- CONSTANT PAIN RIGHT HIP---AND PAIN LEFT KNEE--PT STATES SHE GETS INJECTIONS INTO HER KNEE  . Complication of anesthesia    BLOOD PRESSURE DROPPED WITH NASAL SURGERY, ONE OF THE CARPAL TUNNEL REPAIRS AND DURING A COLONOSCOPY  . Dyslipidemia   . History of skin cancer   . Hypertension   . Hypothyroidism   . Menopausal symptoms   . NSTEMI (non-ST elevated myocardial infarction) ([place]) 12/09/15   Medical management: Distal branch of D1 95%, ostial D2 75%, distal LAD 50%. Tortuous arteries consistent with hypertension  . Ovarian cancer ([place]) 01/2013   Recurrence since 2014/2016  . PAF (paroxysmal atrial fibrillation) ([place])    On ELIQUIS; On Amiodarone (Eye Exam 12/25/15) - no longer on Rythmol    Past Surgical History:  Procedure Laterality Date  . BILATERAL CARPAL TUNNEL REPAIR  2007  . BREAST BIOPSY Left 03/19/2014   benign  . DILATION AND CURETTAGE OF UTERUS  1969  . EXPLORATORY LAPAROTOMY TOTAL ABDOMINAL HYSTERECTOMY BILATERAL SALPINGO-OOPHORECTOMY, Partial Rectal Resection with Reanastamosis Bilateral 02/13/2013   Performed by Alvino Chapel, MD at [facility] ORS  . Left Heart Cath and Coronary Angiography N/A 12/05/2015   Performed by Belva Crome, MD at [facility]  INVASIVE CV LAB  . LEFT KNEE ARTHROSCOPY   2011  . LEFT KNEE PARTIAL SYNOVECTOMY WITH POLY EXCHANGE Left 03/18/2017   Performed by Mcarthur Rossetti, MD at [facility] ORS  . LEFT TOTAL KNEE ARTHROPLASTY Left 01/03/2015   Performed by Mcarthur Rossetti, MD at [facility] ORS  . NM MYOVIEW LTD  June 2010    subbmaximal with no  ischemia or infarction.  . OMENTECTOMY  02/13/2013   Performed by Alvino Chapel, MD at [facility] ORS  . PEVLIC  LYMPHADENECTOMY, DEBULKING right pelvic tumor nodules Right 02/13/2013   Performed by Alvino Chapel, MD at [facility] ORS  . RHINOPLASTY FOR FRACTURED NOSE  1986  . SURGERY FOR RUPTURED OVARIAN CYST  1969  . TAH/BSO/Tumor debulking with right pelvic LND  01/2013  . TONSILLECTOMY  1962  . TOTAL HIP ARTHROPLASTY ANTERIOR APPROACH Right 02/25/2012   Performed by Mcarthur Rossetti, MD at [facility] ORS  . TRANSTHORACIC ECHOCARDIOGRAM  May 2014; January 2016   a. Normal LV size function. EF 60-65%. Grade 1 diastolic function. Mild MR and mildly elevated PA pressures of 37 mmHg per;; b. EF 60-65%. Mobile echodensity 10 mm x 6 mm attest interventricular septum is questionable fibroblastoma.    Current Meds  Medication Sig  . acetaminophen (TYLENOL) 500 MG tablet Take 1,000 mg by mouth every 6 (six) hours as needed for mild pain.   Marland Kitchen amiodarone (PACERONE) 200 MG tablet Take 100 mg by mouth daily.  Marland Kitchen amoxicillin (AMOXIL) 500 MG capsule Take 2,000 mg by mouth See admin instructions. Takes 4 capsules 1 hour prior to procedure  . Biotin 10 MG TABS Take 10 mg by mouth daily.   Marland Kitchen desonide (DESOWEN) 0.05 % cream Apply 1 application topically 2 (two) times daily.   . diphenhydrAMINE (BENADRYL) 25 MG tablet Take 50 mg by mouth at bedtime as needed for sleep.   Marland Kitchen docusate sodium (COLACE) 100 MG capsule Take 250 mg by mouth 2 (two) times daily.   Marland Kitchen ELIQUIS 5 MG TABS tablet TAKE 1 TABLET (5 MG TOTAL) BY MOUTH 2 (TWO) TIMES DAILY.  Marland Kitchen Evolocumab with Infusor 420 MG/3.5ML SOCT Inject into the skin.  Marland Kitchen exemestane (AROMASIN) 25 MG tablet Take 1 tablet (25 mg total) by mouth daily after breakfast.  . ezetimibe (ZETIA) 10 MG tablet Take 1 tablet (10 mg total) by mouth daily. (Patient taking differently: Take 10 mg by mouth at bedtime. )  . Glucosamine HCl 1000 MG TABS Take 1,000 mg by mouth daily.  .  hydrocortisone 1 % ointment Apply 1 application topically 2 (two) times daily as needed for itching (eczema).   Marland Kitchen ipratropium (ATROVENT) 0.06 % nasal spray Place 2 sprays into both nostrils daily.   . irbesartan (AVAPRO) 300 MG tablet TAKE 1 TABLET (300 MG TOTAL) BY MOUTH DAILY. (Patient taking differently: TAKE 1/2 TABLET (300 MG TOTAL) BY MOUTH every evening)  . levothyroxine (SYNTHROID) 88 MCG tablet Take 88 mcg by mouth daily before breakfast.  . lidocaine-prilocaine (EMLA) cream Apply to Porta-Cath site 1-2 hours prior to access as directed. (Patient taking differently: Apply 1 application topically See admin instructions. Apply to Porta-Cath site 1-2 hours prior to access as directed.)  . loratadine (CLARITIN) 5 MG chewable tablet Chew 5 mg daily by mouth.  . magnesium hydroxide (MILK OF MAGNESIA) 800 MG/5ML suspension Take 30 mLs by mouth daily as needed for constipation. Reported on 11/20/2015  . Melatonin 5 MG CAPS Take 5 mg by mouth at bedtime as  needed (sleep). Reported on 12/25/2015  . Menthol, Topical Analgesic, (ICY HOT EX) Apply 1 application topically 2 (two) times daily as needed (PAIN).   . Multiple Vitamin (MULTIVITAMIN WITH MINERALS) TABS tablet Take 1 tablet by mouth daily. Centrum Silver  . Polyethyl Glycol-Propyl Glycol (SYSTANE OP) Place 1 drop into both eyes 2 (two) times daily.   . polyethylene glycol (MIRALAX / GLYCOLAX) packet Take 17 g by mouth daily. Mix in 8 oz liquid and drink  . potassium chloride (K-DUR) 10 MEQ tablet Take 10 mEq by mouth every Monday, Wednesday, and Friday.  Marland Kitchen SHINGRIX injection   . spironolactone (ALDACTONE) 25 MG tablet Take 25 mg by mouth See admin instructions. Takes on Tuesdays, Thursday, Saturday, Sunday    Allergies  Allergen Reactions  . Clonidine Derivatives Shortness Of Breath  . Coreg [Carvedilol] Shortness Of Breath  . Caffeine Other (See Comments)    Makes heart race  . Codeine Other (See Comments)    Does not like the feeling  she gets  . Flexeril [Cyclobenzaprine] Other (See Comments)    Pt states "increased heart rate"  . Lipitor [Atorvastatin] Dermatitis    "feels like bugs are biting her"   . Lisinopril     LIP NUMBNESS  . Pravastatin Other (See Comments)    FEELS LIKE "BUG BITES" BITING HER LEGS   . Tape Rash    TEGADERM.   (use opsite on PAC)  . Tegaderm Ag Mesh [Silver] Rash and Other (See Comments)    Burns skin  . Ultram [Tramadol] Palpitations  - Repatha causes Rosacia.  Social History   Socioeconomic History  . Marital status: Married    Spouse name: None  . Number of children: None  . Years of education: None  . Highest education level: None  Social Needs  . Financial resource strain: None  . Food insecurity - worry: None  . Food insecurity - inability: None  . Transportation needs - medical: None  . Transportation needs - non-medical: None  Occupational History  . None  Tobacco Use  . Smoking status: Never Smoker  . Smokeless tobacco: Never Used  Substance and Sexual Activity  . Alcohol use: No    Alcohol/week: 0.0 oz  . Drug use: No  . Sexual activity: Not Currently  Other Topics Concern  . None  Social History Narrative   Married mother of 3, grandmother 80.   Exercises 6/7 days a week walking 30 minutes a time.   Never smoked or drank alcohol.    family history includes AAA (abdominal aortic aneurysm) in her father and paternal grandmother; Basal cell carcinoma in her mother; Heart Problems in her maternal grandfather and maternal uncle; Hypertension in her mother; Other in her daughter; Prostate cancer in her other; Prostate cancer (age of onset: 31) in her maternal uncle.  Wt Readings from Last 3 Encounters:  10/13/17 133 lb (60.3 kg)  09/05/17 135 lb 9.6 oz (61.5 kg)  07/01/17 132 lb 14.4 oz (60.3 kg)    PHYSICAL EXAM BP 136/78   Pulse 76   Ht 5\' 3"  (1.6 m)   Wt 133 lb (60.3 kg)   BMI 23.56 kg/m  Physical Exam  Constitutional: She is oriented to person,  place, and time. She appears well-developed and well-nourished. No distress.  Healthy-appearing.  Well-groomed.  Seems to be going back her hair.  HENT:  Head: Normocephalic and atraumatic.  Eyes: Conjunctivae are normal.  Wears glasses  Neck: Normal range of motion. Neck supple. No  JVD present.  Cardiovascular: Normal rate, regular rhythm, normal heart sounds, intact distal pulses and normal pulses.  No extrasystoles are present. Exam reveals no gallop and no friction rub.  No murmur heard. Pulmonary/Chest: Effort normal. No respiratory distress. She has no wheezes. She has no rales.  Abdominal: Soft. Bowel sounds are normal. She exhibits no distension. There is no tenderness. There is no rebound.  Musculoskeletal: Normal range of motion. She exhibits no edema or deformity.  Neurological: She is alert and oriented to person, place, and time.  Skin: Skin is warm and dry. No rash noted. No erythema.  Mild facial redness  Psychiatric: She has a normal mood and affect. Her behavior is normal. Judgment and thought content normal.  Nursing note and vitals reviewed.    Adult ECG Report  Rate: 76 ;  Rhythm: normal sinus rhythm and Otherwise normal axis, intervals and durations;   Narrative Interpretation: Normal EKG   Other studies Reviewed: Additional studies/ records that were reviewed today include:  Recent Labs:   Lab Results  Component Value Date   CHOL 232 (H) 12/04/2015   HDL 70 12/04/2015   LDLCALC 148 (H) 12/04/2015   TRIG 68 12/04/2015   CHOLHDL 3.3 12/04/2015  .- Dr. 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  No active anginal symptoms. Not on aspirin because of Eliquis.  Not on beta-blocker because of chronotropic incompetence has beta-blocker portion of  amiodarone. She is on ARB.  Not on statin due to intolerance.  She is on Zetia along with Repatha.      Relevant Orders   EKG 12-Lead (Completed)   Pulmonary Function Test   Dyslipidemia, goal LDL below 70 (Chronic)    Much better controlled now that she is back on Repatha.  Continue Zetia as well.  I do not have a recent labs, but she told that her LDL was close to 70. Interesting that she had a rosacea-like reaction to Repatha, but seems to be controlled now with the medication from her dermatologist.      Essential hypertension (Chronic)    Blood pressure is relatively well controlled today on irbesartan and spironolactone. Apparently had issues with losing hair on amlodipine.  She was therefore switched to Spironolactone based on my recommendations from March.  Tolerating it well..      High risk medications (not anticoagulants) long-term use    Long-term amiodarone use.      On amiodarone therapy (Chronic)    She seems to be having annual thyroid function test and liver function test checked by her PCP. She is due for follow-up PFTs. Needs annual eye exam.      Relevant Orders   Pulmonary Function Test   PAF (paroxysmal atrial fibrillation) ([place]): Symptomatic - Rhythm control with Amiodarone. CHA2DS2Vasc Score 7: On Eliquis - Primary (Chronic)    For the most part, she is maintaining sinus rhythm with amiodarone.  Currently taking 100 mg daily.  No longer on a beta-blocker because of chronotropic incompetence. Does not really seem to be having any breakthroughs, Recommendation: If she does have breakthrough episode, recommend take amiodarone 400 mg twice daily until it breaks.  Doing well with Eliquis.  No bleeding issues.      Relevant Orders   EKG 12-Lead (Completed)   Pulmonary  Function Test   Stroke with cerebral ischemia (HCC) (Chronic)    Presumably had a cardioembolic event.  Now switched to Eliquis without bleeding issues. No recurrent symptoms.  Minimal  residual effect.      Syncope, history of (Chronic)    No recurrent episodes.  Not clear what the true etiology was last time.         [name] has multiple cardiac problems.  Despite her doing well, her visit still relatively prolonged.  At least 30 minutes spent directly with the patient.  Current medicines are reviewed at length with the patient today. (+/- concerns) n/a The following changes have been made: n/a  Patient Instructions  NO MEDICATION CHANGES EXCEPT:   IF YOU HAVE  AN EPISODE LIKE YOU DISCUSSED WITH DR Gissele Narducci AND IT LEAST FOR THAN 4 HOURS  OR LONGER-- TAKE 400 MG AMIODARONE  ONE TIME DOSE . IF SYMPTOMS ARE STILL PRESENT THE NEXT DAY CALL OFFICE FOR FURTHER INSTRUCTIONS.    SCHEDULE AT [place] OR NOV 2019 .Your physician has recommended that you have a pulmonary function test. Pulmonary Function Tests are a group of tests that measure how well air moves in and out of your lungs.    Your physician wants you to follow-up in [place]. You will receive a reminder letter in the mail two months in advance. If you don't receive a letter, please call our office to schedule the follow-up appointment.   If you need a refill on your cardiac medications before your next appointment, please call your pharmacy.    Studies Ordered:   Orders Placed This Encounter  Procedures  . EKG 12-Lead  . Pulmonary Function Test      Glenetta Hew, M.D., M.S. Interventional Cardiologist   Pager # [phone] Phone # [phone] [address] [place], [place] [address]

## 2017-10-13 NOTE — Patient Instructions (Signed)
NO MEDICATION CHANGES EXCEPT:   IF YOU HAVE  AN EPISODE LIKE YOU DISCUSSED WITH DR HARDING AND IT LEAST FOR THAN 4 HOURS  OR LONGER-- TAKE 400 MG AMIODARONE  ONE TIME DOSE . IF SYMPTOMS ARE STILL PRESENT THE NEXT DAY CALL OFFICE FOR FURTHER INSTRUCTIONS.    SCHEDULE AT [place] OR NOV 2019 .Your physician has recommended that you have a pulmonary function test. Pulmonary Function Tests are a group of tests that measure how well air moves in and out of your lungs.    Your physician wants you to follow-up in [place]. You will receive a reminder letter in the mail two months in advance. If you don't receive a letter, please call our office to schedule the follow-up appointment.   If you need a refill on your cardiac medications before your next appointment, please call your pharmacy.

## 2017-10-15 ENCOUNTER — Encounter: Payer: Self-pay | Admitting: Cardiology

## 2017-10-15 NOTE — Assessment & Plan Note (Addendum)
Long-term amiodarone use.

## 2017-10-15 NOTE — Assessment & Plan Note (Signed)
She does have borderline obstructive disease in the small vessels.  No active anginal symptoms. Not on aspirin because of Eliquis.  Not on beta-blocker because of chronotropic incompetence has beta-blocker portion of amiodarone. She is on ARB.  Not on statin due to intolerance.  She is on Zetia along with Repatha.

## 2017-10-15 NOTE — Assessment & Plan Note (Signed)
Much better controlled now that she is back on Repatha.  Continue Zetia as well.  I do not have a recent labs, but she told that her LDL was close to 70. Interesting that she had a rosacea-like reaction to Repatha, but seems to be controlled now with the medication from her dermatologist.

## 2017-10-15 NOTE — Assessment & Plan Note (Signed)
She seems to be having annual thyroid function test and liver function test checked by her PCP. She is due for follow-up PFTs. Needs annual eye exam.

## 2017-10-15 NOTE — Assessment & Plan Note (Signed)
For the most part, she is maintaining sinus rhythm with amiodarone.  Currently taking 100 mg daily.  No longer on a beta-blocker because of chronotropic incompetence. Does not really seem to be having any breakthroughs, Recommendation: If she does have breakthrough episode, recommend take amiodarone 400 mg twice daily until it breaks.  Doing well with Eliquis.  No bleeding issues.

## 2017-10-15 NOTE — Assessment & Plan Note (Signed)
Presumably had a cardioembolic event.  Now switched to Eliquis without bleeding issues. No recurrent symptoms.  Minimal residual effect.

## 2017-10-15 NOTE — Assessment & Plan Note (Signed)
Blood pressure is relatively well controlled today on irbesartan and spironolactone. Apparently had issues with losing hair on amlodipine.  She was therefore switched to Spironolactone based on my recommendations from March.  Tolerating it well.Marland Kitchen

## 2017-10-15 NOTE — Assessment & Plan Note (Signed)
No recurrent episodes.  Not clear what the true etiology was last time.

## 2017-10-25 ENCOUNTER — Ambulatory Visit (INDEPENDENT_AMBULATORY_CARE_PROVIDER_SITE_OTHER): Payer: Medicare Other | Admitting: Orthopaedic Surgery

## 2017-10-25 DIAGNOSIS — M5441 Lumbago with sciatica, right side: Secondary | ICD-10-CM | POA: Diagnosis not present

## 2017-10-25 DIAGNOSIS — G8929 Other chronic pain: Secondary | ICD-10-CM | POA: Diagnosis not present

## 2017-10-25 NOTE — Progress Notes (Signed)
The patient is now about a week and a half out from an epidural steroid injection by Dr. Ernestina Patches.  This is at the L4-5 level and L5-S1 level mainly the facet joints.  He has done very well since his injections.  She has had some pain in her back but not to the extent that she is having.  She says her right total hip and her left total knee are doing well.  She is not confident with her knees though in terms of his balance and coordination.  She is been through physical therapy for her legs and her back.  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  Distally though her motor function seems to be doing well with her legs.  At this point she can try over-the-counter lumbar back support just for comfort if needed.  She will follow-up as needed and we can always set her up for repeat intervention by Dr. Ernestina Patches if needed.

## 2017-11-07 ENCOUNTER — Other Ambulatory Visit: Payer: Medicare Other

## 2017-11-08 ENCOUNTER — Other Ambulatory Visit: Payer: Medicare Other

## 2017-11-08 ENCOUNTER — Ambulatory Visit (HOSPITAL_BASED_OUTPATIENT_CLINIC_OR_DEPARTMENT_OTHER): Payer: Medicare Other

## 2017-11-08 DIAGNOSIS — Z95828 Presence of other vascular implants and grafts: Secondary | ICD-10-CM

## 2017-11-08 DIAGNOSIS — C569 Malignant neoplasm of unspecified ovary: Secondary | ICD-10-CM

## 2017-11-08 DIAGNOSIS — C561 Malignant neoplasm of right ovary: Secondary | ICD-10-CM | POA: Diagnosis not present

## 2017-11-08 DIAGNOSIS — C7989 Secondary malignant neoplasm of other specified sites: Secondary | ICD-10-CM

## 2017-11-08 MED ORDER — HEPARIN SOD (PORK) LOCK FLUSH 100 UNIT/ML IV SOLN
500.0000 [IU] | Freq: Once | INTRAVENOUS | Status: AC
  Administered 2017-11-08: 500 [IU] via INTRAVENOUS
  Filled 2017-11-08: qty 5

## 2017-11-08 MED ORDER — SODIUM CHLORIDE 0.9% FLUSH
10.0000 mL | INTRAVENOUS | Status: DC | PRN
  Administered 2017-11-08: 10 mL via INTRAVENOUS
  Filled 2017-11-08: qty 10

## 2017-11-08 NOTE — Patient Instructions (Signed)
Implanted Port Home Guide An implanted port is a type of central line that is placed under the skin. Central lines are used to provide IV access when treatment or nutrition needs to be given through a person's veins. Implanted ports are used for long-term IV access. An implanted port may be placed because:  You need IV medicine that would be irritating to the small veins in your hands or arms.  You need long-term IV medicines, such as antibiotics.  You need IV nutrition for a long period.  You need frequent blood draws for lab tests.  You need dialysis.  Implanted ports are usually placed in the chest area, but they can also be placed in the upper arm, the abdomen, or the leg. An implanted port has two main parts:  Reservoir. The reservoir is round and will appear as a small, raised area under your skin. The reservoir is the part where a needle is inserted to give medicines or draw blood.  Catheter. The catheter is a thin, flexible tube that extends from the reservoir. The catheter is placed into a large vein. Medicine that is inserted into the reservoir goes into the catheter and then into the vein.  How will I care for my incision site? Do not get the incision site wet. Bathe or shower as directed by your health care provider. How is my port accessed? Special steps must be taken to access the port:  Before the port is accessed, a numbing cream can be placed on the skin. This helps numb the skin over the port site.  Your health care provider uses a sterile technique to access the port. ? Your health care provider must put on a mask and sterile gloves. ? The skin over your port is cleaned carefully with an antiseptic and allowed to dry. ? The port is gently pinched between sterile gloves, and a needle is inserted into the port.  Only "non-coring" port needles should be used to access the port. Once the port is accessed, a blood return should be checked. This helps ensure that the port  is in the vein and is not clogged.  If your port needs to remain accessed for a constant infusion, a clear (transparent) bandage will be placed over the needle site. The bandage and needle will need to be changed every week, or as directed by your health care provider.  Keep the bandage covering the needle clean and dry. Do not get it wet. Follow your health care provider's instructions on how to take a shower or bath while the port is accessed.  If your port does not need to stay accessed, no bandage is needed over the port.  What is flushing? Flushing helps keep the port from getting clogged. Follow your health care provider's instructions on how and when to flush the port. Ports are usually flushed with saline solution or a medicine called heparin. The need for flushing will depend on how the port is used.  If the port is used for intermittent medicines or blood draws, the port will need to be flushed: ? After medicines have been given. ? After blood has been drawn. ? As part of routine maintenance.  If a constant infusion is running, the port may not need to be flushed.  How long will my port stay implanted? The port can stay in for as long as your health care provider thinks it is needed. When it is time for the port to come out, surgery will be   done to remove it. The procedure is similar to the one performed when the port was put in. When should I seek immediate medical care? When you have an implanted port, you should seek immediate medical care if:  You notice a bad smell coming from the incision site.  You have swelling, redness, or drainage at the incision site.  You have more swelling or pain at the port site or the surrounding area.  You have a fever that is not controlled with medicine.  This information is not intended to replace advice given to you by your health care provider. Make sure you discuss any questions you have with your health care provider. Document  Released: 11/15/2005 Document Revised: 04/22/2016 Document Reviewed: 07/23/2013 Elsevier Interactive Patient Education  2017 Elsevier Inc.  

## 2017-11-09 ENCOUNTER — Other Ambulatory Visit: Payer: Medicare Other

## 2017-11-09 LAB — CA 125: Cancer Antigen (CA) 125: 80.2 U/mL — ABNORMAL HIGH (ref 0.0–38.1)

## 2017-11-10 ENCOUNTER — Other Ambulatory Visit: Payer: Self-pay | Admitting: Pharmacist Clinician (PhC)/ Clinical Pharmacy Specialist

## 2017-11-10 DIAGNOSIS — E785 Hyperlipidemia, unspecified: Secondary | ICD-10-CM

## 2017-11-11 ENCOUNTER — Ambulatory Visit (HOSPITAL_BASED_OUTPATIENT_CLINIC_OR_DEPARTMENT_OTHER): Payer: Medicare Other

## 2017-11-11 ENCOUNTER — Ambulatory Visit: Payer: Medicare Other | Attending: Gynecology | Admitting: Gynecology

## 2017-11-11 ENCOUNTER — Encounter: Payer: Self-pay | Admitting: Gynecology

## 2017-11-11 VITALS — BP 156/71 | HR 74 | Temp 97.5°F | Resp 18 | Ht 63.0 in | Wt 134.5 lb

## 2017-11-11 DIAGNOSIS — Z90710 Acquired absence of both cervix and uterus: Secondary | ICD-10-CM | POA: Diagnosis not present

## 2017-11-11 DIAGNOSIS — I252 Old myocardial infarction: Secondary | ICD-10-CM | POA: Diagnosis not present

## 2017-11-11 DIAGNOSIS — Z85828 Personal history of other malignant neoplasm of skin: Secondary | ICD-10-CM | POA: Diagnosis not present

## 2017-11-11 DIAGNOSIS — Z79899 Other long term (current) drug therapy: Secondary | ICD-10-CM | POA: Insufficient documentation

## 2017-11-11 DIAGNOSIS — R971 Elevated cancer antigen 125 [CA 125]: Secondary | ICD-10-CM

## 2017-11-11 DIAGNOSIS — Z7902 Long term (current) use of antithrombotics/antiplatelets: Secondary | ICD-10-CM | POA: Diagnosis not present

## 2017-11-11 DIAGNOSIS — I10 Essential (primary) hypertension: Secondary | ICD-10-CM | POA: Diagnosis not present

## 2017-11-11 DIAGNOSIS — I480 Paroxysmal atrial fibrillation: Secondary | ICD-10-CM | POA: Insufficient documentation

## 2017-11-11 DIAGNOSIS — E039 Hypothyroidism, unspecified: Secondary | ICD-10-CM | POA: Insufficient documentation

## 2017-11-11 DIAGNOSIS — Z808 Family history of malignant neoplasm of other organs or systems: Secondary | ICD-10-CM | POA: Insufficient documentation

## 2017-11-11 DIAGNOSIS — Z8543 Personal history of malignant neoplasm of ovary: Secondary | ICD-10-CM | POA: Diagnosis not present

## 2017-11-11 DIAGNOSIS — C786 Secondary malignant neoplasm of retroperitoneum and peritoneum: Secondary | ICD-10-CM

## 2017-11-11 DIAGNOSIS — Z96641 Presence of right artificial hip joint: Secondary | ICD-10-CM | POA: Insufficient documentation

## 2017-11-11 DIAGNOSIS — Z8042 Family history of malignant neoplasm of prostate: Secondary | ICD-10-CM | POA: Diagnosis not present

## 2017-11-11 DIAGNOSIS — Z90721 Acquired absence of ovaries, unilateral: Secondary | ICD-10-CM | POA: Insufficient documentation

## 2017-11-11 DIAGNOSIS — Z9889 Other specified postprocedural states: Secondary | ICD-10-CM | POA: Insufficient documentation

## 2017-11-11 DIAGNOSIS — E785 Hyperlipidemia, unspecified: Secondary | ICD-10-CM | POA: Insufficient documentation

## 2017-11-11 DIAGNOSIS — Z79810 Long term (current) use of selective estrogen receptor modulators (SERMs): Secondary | ICD-10-CM

## 2017-11-11 DIAGNOSIS — Z955 Presence of coronary angioplasty implant and graft: Secondary | ICD-10-CM | POA: Diagnosis not present

## 2017-11-11 DIAGNOSIS — C569 Malignant neoplasm of unspecified ovary: Secondary | ICD-10-CM | POA: Insufficient documentation

## 2017-11-11 DIAGNOSIS — Z96652 Presence of left artificial knee joint: Secondary | ICD-10-CM | POA: Diagnosis not present

## 2017-11-11 DIAGNOSIS — Z8249 Family history of ischemic heart disease and other diseases of the circulatory system: Secondary | ICD-10-CM | POA: Diagnosis not present

## 2017-11-11 LAB — BASIC METABOLIC PANEL
Anion Gap: 10 mEq/L (ref 3–11)
BUN: 10.9 mg/dL (ref 7.0–26.0)
CALCIUM: 9.6 mg/dL (ref 8.4–10.4)
CO2: 29 meq/L (ref 22–29)
Chloride: 102 mEq/L (ref 98–109)
Creatinine: 0.7 mg/dL (ref 0.6–1.1)
GLUCOSE: 81 mg/dL (ref 70–140)
Potassium: 4 mEq/L (ref 3.5–5.1)
Sodium: 141 mEq/L (ref 136–145)

## 2017-11-11 MED ORDER — TAMOXIFEN CITRATE 20 MG PO TABS: 20.0000 mg | ORAL_TABLET | Freq: Every day | ORAL | 6 refills | Status: DC

## 2017-11-11 NOTE — Progress Notes (Signed)
Consult Note: Gyn-Onc   [name] 76 y.o. female  Chief Complaint  Patient presents with  . 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   Now with disease progression based on a doubling of the patient Ca1 25 and symptoms of abdominal bloating and early satiety.  Treatment options were discussed with the patient and her husband which could include returning to cytotoxic chemotherapy (Doxil, gemcitabine, Topotecan, Taxotere, carboplatin, and Taxol).  PARP inhibitors were also discussed (the patient is BRCA negative).  The patient is very reluctant to return to cytotoxic chemotherapy.  Given that she responded to letrozole, we will begin the patient on tamoxifen 20 mg a day the next 2 months.  We will obtain a new baseline CT scan in the next week or so.  Patient return in 2 months for reevaluation.  Interval History:   The patient returns today as previously scheduled.  She is tolerating the Aromisin very well except for some hot flashes.  She reports that she has been having some abdominal bloating and early satiety with no nausea or vomiting.  Unfortunately a recent Ca1 25 is now 80 units/mL (previously 40 units/mL).  Patient has no other GI GU or pelvic symptoms.  Her functional status is overall very good.  She is planning on hosting Christmas dinner for approximately 12 family members.Marland Kitchen  HPI:The patient initially presented with a pelvic mass and elevated CA 125 (178 units per mL) She underwent exploratory laparotomy and debulking on 02/13/2013. Final pathology showed a poorly differentiated ovarian cancer involving both ovaries pelvic peritoneum and rectal muscularis. All gross disease was resected.  She then received 6 cycles of carboplatin and Taxol chemotherapy completed in September 2014. At the completion of chemotherapy her CA 125 was 18 units per mL.   In May 2016 the patient  CA-125 began to rise and a PET CT scan showed metastatic disease in the spleen and pelvic lymph nodes. Given the long platinum free interval , we retreated with carboplatin, Taxol, and a Avastin. She received 6 cycles of carboplatin and Taxol and a Avastin the last being administered in September 2016. She had a significant response with CA 125 falling to 12 units per mL in September. CT scan showed persistent disease in the spleen (which was actually responding). Patient received a total 8 cycles and on follow-up in December 2016 had persistent disease in the spleen which had slightly decreased in size. At that time CA-125 is 14 units per mL.  In March 2017 we switch the patient to letrozole.   A CT scan obtained 06/23/2016 showed a response with the spleen met measure 1.5 x 1.5 cm (previously 2.4 x 3.9 cm). Because of side effects resolve was discontinued in November 2017. A follow-up CT scan on 12/01/2016 showed no evidence of the splenic lesion and her CA-125 at that time was 14 units per mL.  In May 2018 CA-125 rose to 24 units per mL and a follow-up CT scan showed new lesions in the gastrosplenic ligament ( 2.5 cm) and a lesion in the left pelvis measuring 2.4 cm. Because she had previously responded to Letrozole ( although she did not tolerate let resolve very well) we began Aromasin 25 mg daily.   Review of Systems:10 point review of systems is negative except as noted in interval history.   Vitals: Blood pressure (!) 156/71, pulse 74, temperature (!) 97.5 F (36.4 C),  temperature source Oral, resp. rate 18, height 5' 3"  (1.6 m), weight 134 lb 8 oz (61 kg), SpO2 100 %.  Physical Exam: General : The patient is a healthy woman in no acute distress.  HEENT: normocephalic, extraoccular movements normal; neck is supple without thyromegally , Port-A-Cath appears healthy is no evidence of infection in the right chest wall. Lynphnodes: Supraclavicular and inguinal nodes not enlarged  Abdomen: Soft,  non-tender, no ascites, no organomegally, no masses, no hernias , midline incision is healing well  Pelvic:    EGBUS: Normal female  Vagina bladder urethra: Normal  Vaginal cuff is well healed.  Cervix and uterus are surgically absent  Bimanual exam: No masses nodularity or fullness.  Rectovaginal exam confirms      Lower extremities: No edema or varicosities. Normal range of motion      Allergies  Allergen Reactions  . Clonidine Derivatives Shortness Of Breath  . Coreg [Carvedilol] Shortness Of Breath  . Caffeine Other (See Comments)    Makes heart race  . Codeine Other (See Comments)    Does not like the feeling she gets  . Flexeril [Cyclobenzaprine] Other (See Comments)    Pt states "increased heart rate"  . Lipitor [Atorvastatin] Dermatitis    "feels like bugs are biting her"   . Lisinopril     LIP NUMBNESS  . Pravastatin Other (See Comments)    FEELS LIKE "BUG BITES" BITING HER LEGS   . Tape Rash    TEGADERM.   (use opsite on PAC)  . Tegaderm Ag Mesh [Silver] Rash and Other (See Comments)    Burns skin  . Ultram [Tramadol] Palpitations    Past Medical History:  Diagnosis Date  . Arthritis    OSTEOARTHRITIS   -- CONSTANT PAIN RIGHT HIP---AND PAIN LEFT KNEE--PT STATES SHE GETS INJECTIONS INTO HER KNEE  . Complication of anesthesia    BLOOD PRESSURE DROPPED WITH NASAL SURGERY, ONE OF THE CARPAL TUNNEL REPAIRS AND DURING A COLONOSCOPY  . Dyslipidemia   . History of skin cancer   . Hypertension   . Hypothyroidism   . Menopausal symptoms   . NSTEMI (non-ST elevated myocardial infarction) ([place]) 12/09/15   Medical management: Distal branch of D1 95%, ostial D2 75%, distal LAD 50%. Tortuous arteries consistent with hypertension  . Ovarian cancer ([place]) 01/2013   Recurrence since 2014/2016  . PAF (paroxysmal atrial fibrillation) ([place])    On ELIQUIS; On Amiodarone (Eye Exam 12/25/15) - no longer on Rythmol    Past Surgical History:  Procedure Laterality Date  .  BILATERAL CARPAL TUNNEL REPAIR  2007  . BREAST BIOPSY Left 03/19/2014   benign  . CARDIAC CATHETERIZATION N/A 12/05/2015   Procedure: Left Heart Cath and Coronary Angiography;  Surgeon: Belva Crome, MD;  Location: [facility];  Service: Cardiovascular;  distal branch of D1 95%, ostial D2 75%, dLAD 50%,p-mRCA 40%. Tortuous vessels.  Marland Kitchen DILATION AND CURETTAGE OF UTERUS  1969  . LAPAROTOMY Bilateral 02/13/2013   Procedure: EXPLORATORY LAPAROTOMY TOTAL ABDOMINAL HYSTERECTOMY BILATERAL SALPINGO-OOPHORECTOMY, Partial Rectal Resection with Reanastamosis;  Surgeon: Alvino Chapel, MD;  Location: [facility];  Service: Gynecology;  Laterality: Bilateral;  . LEFT KNEE ARTHROSCOPY   2011  . LYMPHADENECTOMY Right 02/13/2013   Procedure: PEVLIC  LYMPHADENECTOMY, DEBULKING right pelvic tumor nodules;  Surgeon: Alvino Chapel, MD;  Location: [facility];  Service: Gynecology;  Laterality: Right;  . NM MYOVIEW LTD  June 2010    subbmaximal with no ischemia or infarction.  Marland Kitchen  OMENTECTOMY  02/13/2013   Procedure: OMENTECTOMY;  Surgeon: Alvino Chapel, MD;  Location: [facility];  Service: Gynecology;;  . [place]  . SURGERY FOR RUPTURED OVARIAN CYST  1969  . SYNOVECTOMY WITH POLY EXCHANGE Left 03/18/2017   Procedure: LEFT KNEE PARTIAL SYNOVECTOMY WITH POLY EXCHANGE;  Surgeon: Mcarthur Rossetti, MD;  Location: [facility];  Service: Orthopedics;  Laterality: Left;  Adductor Block  . TAH/BSO/Tumor debulking with right pelvic LND  01/2013  . TONSILLECTOMY  1962  . TOTAL HIP ARTHROPLASTY  02/25/2012   Procedure: TOTAL HIP ARTHROPLASTY ANTERIOR APPROACH;  Surgeon: Mcarthur Rossetti, MD;  Location: [facility];  Service: Orthopedics;  Laterality: Right;  . TOTAL KNEE ARTHROPLASTY Left 01/03/2015   Procedure: LEFT TOTAL KNEE ARTHROPLASTY;  Surgeon: Mcarthur Rossetti, MD;  Location: [facility];  Service: Orthopedics;  Laterality: Left;  . TRANSTHORACIC ECHOCARDIOGRAM  May 2014;  January 2016   a. Normal LV size function. EF 60-65%. Grade 1 diastolic function. Mild MR and mildly elevated PA pressures of 37 mmHg per;; b. EF 60-65%. Mobile echodensity 10 mm x 6 mm attest interventricular septum is questionable fibroblastoma.    Current Outpatient Medications  Medication Sig Dispense Refill  . acetaminophen (TYLENOL) 500 MG tablet Take 1,000 mg by mouth every 6 (six) hours as needed for mild pain.     Marland Kitchen amiodarone (PACERONE) 200 MG tablet Take 100 mg by mouth daily.  3  . amoxicillin (AMOXIL) 500 MG capsule Take 2,000 mg by mouth See admin instructions. Takes 4 capsules 1 hour prior to procedure    . Biotin 10 MG TABS Take 10 mg by mouth daily.     Marland Kitchen desonide (DESOWEN) 0.05 % cream Apply 1 application topically 2 (two) times daily.   1  . diphenhydrAMINE (BENADRYL) 25 MG tablet Take 50 mg by mouth at bedtime as needed for sleep.     Marland Kitchen docusate sodium (COLACE) 100 MG capsule Take 250 mg by mouth 2 (two) times daily.     Marland Kitchen ELIQUIS 5 MG TABS tablet TAKE 1 TABLET (5 MG TOTAL) BY MOUTH 2 (TWO) TIMES DAILY. 60 tablet 5  . Evolocumab with Infusor 420 MG/3.5ML SOCT Inject into the skin.    Marland Kitchen exemestane (AROMASIN) 25 MG tablet Take 1 tablet (25 mg total) by mouth daily after breakfast. 30 tablet 6  . ezetimibe (ZETIA) 10 MG tablet Take 1 tablet (10 mg total) by mouth daily. (Patient taking differently: Take 10 mg by mouth at bedtime. ) 30 tablet 6  . Glucosamine HCl 1000 MG TABS Take 1,000 mg by mouth daily.    . hydrocortisone 1 % ointment Apply 1 application topically 2 (two) times daily as needed for itching (eczema).     Marland Kitchen ipratropium (ATROVENT) 0.06 % nasal spray Place 2 sprays into both nostrils daily.   5  . irbesartan (AVAPRO) 300 MG tablet TAKE 1 TABLET (300 MG TOTAL) BY MOUTH DAILY. (Patient taking differently: TAKE 1/2 TABLET (300 MG TOTAL) BY MOUTH every evening) 90 tablet 1  . levothyroxine (SYNTHROID) 88 MCG tablet Take 88 mcg by mouth daily before breakfast.    .  lidocaine-prilocaine (EMLA) cream Apply to Porta-Cath site 1-2 hours prior to access as directed. (Patient taking differently: Apply 1 application topically See admin instructions. Apply to Porta-Cath site 1-2 hours prior to access as directed.) 30 g 2  . loratadine (CLARITIN) 5 MG chewable tablet Chew 5 mg daily by mouth.    . magnesium  hydroxide (MILK OF MAGNESIA) 800 MG/5ML suspension Take 30 mLs by mouth daily as needed for constipation. Reported on 11/20/2015    . Melatonin 5 MG CAPS Take 5 mg by mouth at bedtime as needed (sleep). Reported on 12/25/2015    . Menthol, Topical Analgesic, (ICY HOT EX) Apply 1 application topically 2 (two) times daily as needed (PAIN).     . Multiple Vitamin (MULTIVITAMIN WITH MINERALS) TABS tablet Take 1 tablet by mouth daily. Centrum Silver    . Polyethyl Glycol-Propyl Glycol (SYSTANE OP) Place 1 drop into both eyes 2 (two) times daily.     . polyethylene glycol (MIRALAX / GLYCOLAX) packet Take 17 g by mouth daily. Mix in 8 oz liquid and drink    . potassium chloride (K-DUR) 10 MEQ tablet Take 10 mEq by mouth every Monday, Wednesday, and Friday.    Marland Kitchen SHINGRIX injection     . spironolactone (ALDACTONE) 25 MG tablet Take 25 mg by mouth See admin instructions. Takes on Tuesdays, Thursday, Saturday, Sunday     No current facility-administered medications for this visit.     Social History   Socioeconomic History  . Marital status: Married    Spouse name: Not on file  . Number of children: Not on file  . Years of education: Not on file  . Highest education level: Not on file  Social Needs  . Financial resource strain: Not on file  . Food insecurity - worry: Not on file  . Food insecurity - inability: Not on file  . Transportation needs - medical: Not on file  . Transportation needs - non-medical: Not on file  Occupational History  . Not on file  Tobacco Use  . Smoking status: Never Smoker  . Smokeless tobacco: Never Used  Substance and Sexual Activity   . Alcohol use: No    Alcohol/week: 0.0 oz  . Drug use: No  . Sexual activity: Not Currently  Other Topics Concern  . Not on file  Social History Narrative   Married mother of 3, grandmother 73.   Exercises 6/7 days a week walking 30 minutes a time.   Never smoked or drank alcohol.    Family History  Problem Relation Age of Onset  . Hypertension Mother   . Basal cell carcinoma Mother   . AAA (abdominal aortic aneurysm) Father   . Heart Problems Maternal Uncle   . Heart Problems Maternal Grandfather   . AAA (abdominal aortic aneurysm) Paternal Grandmother   . Prostate cancer Maternal Uncle 70  . Prostate cancer Other   . Other Daughter        one daughter had TAH-BSO at 40; other daughter will have one soon      Marti Sleigh, MD 11/11/2017, 11:05 AM

## 2017-11-11 NOTE — Patient Instructions (Signed)
Plan on having a CT scan of the abdomen and pelvis.  We will contact you with the results.  Plan on beginning Tamoxifen daily.  Plan on following up in Feb with a CA 125 prior to appt.

## 2017-11-12 LAB — CA 125: Cancer Antigen (CA) 125: 76.9 U/mL — ABNORMAL HIGH (ref 0.0–38.1)

## 2017-11-14 ENCOUNTER — Other Ambulatory Visit: Payer: Self-pay | Admitting: Gynecologic Oncology

## 2017-11-14 DIAGNOSIS — C569 Malignant neoplasm of unspecified ovary: Secondary | ICD-10-CM

## 2017-11-15 ENCOUNTER — Encounter (HOSPITAL_COMMUNITY): Payer: Self-pay

## 2017-11-15 ENCOUNTER — Ambulatory Visit (HOSPITAL_COMMUNITY)
Admission: RE | Admit: 2017-11-15 | Discharge: 2017-11-15 | Disposition: A | Discharge status: Home / Self Care | Payer: Medicare Other | Source: Ambulatory Visit | Attending: Gynecologic Oncology | Admitting: Gynecologic Oncology

## 2017-11-15 DIAGNOSIS — I700 Atherosclerosis of aorta: Secondary | ICD-10-CM | POA: Insufficient documentation

## 2017-11-15 DIAGNOSIS — C786 Secondary malignant neoplasm of retroperitoneum and peritoneum: Secondary | ICD-10-CM | POA: Insufficient documentation

## 2017-11-15 DIAGNOSIS — C569 Malignant neoplasm of unspecified ovary: Secondary | ICD-10-CM | POA: Insufficient documentation

## 2017-11-15 DIAGNOSIS — R971 Elevated cancer antigen 125 [CA 125]: Secondary | ICD-10-CM | POA: Diagnosis present

## 2017-11-15 MED ORDER — IOPAMIDOL (ISOVUE-300) INJECTION 61%
INTRAVENOUS | Status: AC
  Filled 2017-11-15: qty 100

## 2017-11-15 MED ORDER — IOPAMIDOL (ISOVUE-300) INJECTION 61%
100.0000 mL | Freq: Once | INTRAVENOUS | Status: AC | PRN
  Administered 2017-11-15: 100 mL via INTRAVENOUS

## 2017-11-16 ENCOUNTER — Other Ambulatory Visit: Payer: Self-pay | Admitting: Cardiology

## 2017-11-16 ENCOUNTER — Telehealth: Payer: Self-pay | Admitting: Gynecologic Oncology

## 2017-11-16 NOTE — Telephone Encounter (Signed)
Informed patient of CT scan results.  All questions answered.  Advised she would be contacted with recommendations in regards to the potential interaction with tamoxifen and amiodarone.

## 2017-11-18 ENCOUNTER — Telehealth: Payer: Self-pay | Admitting: *Deleted

## 2017-11-18 NOTE — Telephone Encounter (Signed)
Fax referral from Shon Baton, MD of [facility] Sent to scheduling

## 2017-11-25 ENCOUNTER — Telehealth: Payer: Self-pay | Admitting: Gynecologic Oncology

## 2017-11-25 ENCOUNTER — Telehealth: Payer: Self-pay | Admitting: *Deleted

## 2017-11-25 NOTE — Telephone Encounter (Signed)
Left message for patient with Dr. Allison Quarry recommendations for use with tamoxifen and amiodarone.    Tina Man, MD  Dorothyann Gibbs, NP        Demetrios Isaacs - I would expect that she should be OK - is on 1/2 dose of Amiodarone. We can watch her EKG every so often to follow QTc after starting.

## 2017-11-25 NOTE — Telephone Encounter (Signed)
Called and spoke with the patient, moved her appt to Dr. Fermin Schwab from February 15th to February 12th. Also moved lab appt from February 12th to February 8th. Patient aware

## 2017-12-06 ENCOUNTER — Other Ambulatory Visit: Payer: Self-pay | Admitting: Orthopedic Surgery

## 2017-12-08 ENCOUNTER — Telehealth (INDEPENDENT_AMBULATORY_CARE_PROVIDER_SITE_OTHER): Payer: Self-pay | Admitting: Physical Medicine and Rehabilitation

## 2017-12-08 NOTE — Telephone Encounter (Signed)
Yes ok 

## 2017-12-09 NOTE — Telephone Encounter (Signed)
Left message for patient to call to schedule. 

## 2017-12-09 NOTE — Telephone Encounter (Signed)
Scheduled for 12/26/17 at 1030.

## 2017-12-09 NOTE — Progress Notes (Signed)
Lab results: Advanced lipid profile HDL P 36.5 (goal): Small LDL P <90 (goal).  LDL P 449 (target is less than 1000), LDL size 21 (goal> 20.5)  LPa 46 (<75)  Good results

## 2017-12-12 ENCOUNTER — Other Ambulatory Visit: Payer: Self-pay | Admitting: Cardiology

## 2017-12-13 ENCOUNTER — Encounter (HOSPITAL_BASED_OUTPATIENT_CLINIC_OR_DEPARTMENT_OTHER): Payer: Self-pay | Admitting: *Deleted

## 2017-12-13 ENCOUNTER — Telehealth: Payer: Self-pay | Admitting: *Deleted

## 2017-12-13 ENCOUNTER — Other Ambulatory Visit: Payer: Self-pay

## 2017-12-13 NOTE — Telephone Encounter (Signed)
    [facility] Pre-operative Risk Assessment    Request for surgical clearance:  1. What type of surgery is being performed? RELEASE A-1 PULLEY LEFT RING FINGER   2. When is this surgery scheduled? 12/20/17   3. Are there any medications that need to be held prior to surgery and how long?ELIQUIS-THEY ARE ASKING FOR DIRECTION  4. Practice name and name of physician performing surgery? DR Daryll Brod   5. What is your office phone and fax number? PH=[phone] FAX=[phone]   Anesthesia type (None, local, MAC, general) ? IV REGIONAL FOREARM BLOCK  [name] 12/13/2017, 4:38 PM  _________________________________________________________________   (provider comments below)

## 2017-12-15 ENCOUNTER — Encounter (HOSPITAL_BASED_OUTPATIENT_CLINIC_OR_DEPARTMENT_OTHER)
Admission: RE | Admit: 2017-12-15 | Discharge: 2017-12-15 | Disposition: A | Discharge status: Home / Self Care | Payer: Medicare Other | Source: Ambulatory Visit | Attending: Orthopedic Surgery | Admitting: Orthopedic Surgery

## 2017-12-15 DIAGNOSIS — Z01812 Encounter for preprocedural laboratory examination: Secondary | ICD-10-CM | POA: Diagnosis present

## 2017-12-15 LAB — BASIC METABOLIC PANEL
Anion gap: 8 (ref 5–15)
BUN: 14 mg/dL (ref 6–20)
CALCIUM: 9.1 mg/dL (ref 8.9–10.3)
CO2: 28 mmol/L (ref 22–32)
CREATININE: 0.76 mg/dL (ref 0.44–1.00)
Chloride: 101 mmol/L (ref 101–111)
GFR calc Af Amer: >60 mL/min (ref >60)
GLUCOSE: 87 mg/dL (ref 65–99)
POTASSIUM: 4.3 mmol/L (ref 3.5–5.1)
Sodium: 137 mmol/L (ref 135–145)

## 2017-12-16 ENCOUNTER — Telehealth: Payer: Self-pay | Admitting: Cardiology

## 2017-12-16 NOTE — Telephone Encounter (Signed)
   I have notified pt of pharmacy's recs regarding Eliquis. Will also send fax to orthopedic MD listed below for notification.

## 2017-12-16 NOTE — Telephone Encounter (Signed)
Patient with diagnosis of Afib on Eliquis for anticoagulation.    Procedure: release pulley left ring finger Date of procedure: 12/20/17  CHADS2-VASc score of  6 (CHF, HTN, AGE, DM2, stroke/tia x 2, CAD, AGE, female)  CrCl 75ml/min  Per office protocol, patient can hold Eliquis for 24 hours prior to procedure.  Given history of stroke would recommend resume Eliquis as soon as safe after procedure.

## 2017-12-16 NOTE — Telephone Encounter (Signed)
   Primary Cardiologist: Dr. Ellyn Hack   Chart reviewed as part of pre-operative protocol coverage. Patient was contacted 12/16/2017 in reference to pre-operative risk assessment for pending surgery as outlined below. Unable to reach pt at home and mobile. Message left the call the preop clinic back M-F from 1:30-5:00 pm.   Lyda Jester, PA-C 12/16/2017, 1:56 PM

## 2017-12-16 NOTE — Telephone Encounter (Signed)
Follow Up:      Returning your call from today. 

## 2017-12-16 NOTE — Telephone Encounter (Signed)
   Primary Cardiologist: Dr. Ellyn Hack.  Pt called back. No exertional CP or dyspnea. Able to ambulate a flight of stairs w/ anginal symptoms.   Chart reviewed as part of pre-operative protocol coverage. Given past medical history and time since last visit, based on ACC/AHA guidelines, [name] would be at acceptable risk for the planned procedure without further cardiovascular testing.   Will route to pharmacy to address Eliquis.   I will route this recommendation to the requesting party via Epic fax function and remove from pre-op pool.  Please call with questions.  Lyda Jester, PA-C 12/16/2017, 3:50 PM

## 2017-12-20 ENCOUNTER — Ambulatory Visit (HOSPITAL_BASED_OUTPATIENT_CLINIC_OR_DEPARTMENT_OTHER): Payer: Medicare Other | Admitting: Certified Registered"

## 2017-12-20 ENCOUNTER — Encounter (HOSPITAL_BASED_OUTPATIENT_CLINIC_OR_DEPARTMENT_OTHER)
Admission: RE | Disposition: A | Discharge status: Home / Self Care | Payer: Self-pay | Source: Ambulatory Visit | Attending: Orthopedic Surgery

## 2017-12-20 ENCOUNTER — Other Ambulatory Visit: Payer: Self-pay

## 2017-12-20 ENCOUNTER — Encounter (HOSPITAL_BASED_OUTPATIENT_CLINIC_OR_DEPARTMENT_OTHER): Payer: Self-pay | Admitting: Certified Registered"

## 2017-12-20 ENCOUNTER — Ambulatory Visit (HOSPITAL_BASED_OUTPATIENT_CLINIC_OR_DEPARTMENT_OTHER)
Admission: RE | Admit: 2017-12-20 | Discharge: 2017-12-20 | Disposition: A | Discharge status: Home / Self Care | Payer: Medicare Other | Source: Ambulatory Visit | Attending: Orthopedic Surgery | Admitting: Orthopedic Surgery

## 2017-12-20 DIAGNOSIS — I480 Paroxysmal atrial fibrillation: Secondary | ICD-10-CM | POA: Insufficient documentation

## 2017-12-20 DIAGNOSIS — Z90710 Acquired absence of both cervix and uterus: Secondary | ICD-10-CM | POA: Diagnosis not present

## 2017-12-20 DIAGNOSIS — Z85828 Personal history of other malignant neoplasm of skin: Secondary | ICD-10-CM | POA: Insufficient documentation

## 2017-12-20 DIAGNOSIS — M19042 Primary osteoarthritis, left hand: Secondary | ICD-10-CM | POA: Diagnosis not present

## 2017-12-20 DIAGNOSIS — I10 Essential (primary) hypertension: Secondary | ICD-10-CM | POA: Insufficient documentation

## 2017-12-20 DIAGNOSIS — I252 Old myocardial infarction: Secondary | ICD-10-CM | POA: Diagnosis not present

## 2017-12-20 DIAGNOSIS — Z79899 Other long term (current) drug therapy: Secondary | ICD-10-CM | POA: Insufficient documentation

## 2017-12-20 DIAGNOSIS — M65342 Trigger finger, left ring finger: Secondary | ICD-10-CM | POA: Diagnosis present

## 2017-12-20 DIAGNOSIS — E785 Hyperlipidemia, unspecified: Secondary | ICD-10-CM | POA: Insufficient documentation

## 2017-12-20 DIAGNOSIS — Z7901 Long term (current) use of anticoagulants: Secondary | ICD-10-CM | POA: Diagnosis not present

## 2017-12-20 DIAGNOSIS — M67844 Other specified disorders of tendon, left hand: Secondary | ICD-10-CM | POA: Diagnosis not present

## 2017-12-20 DIAGNOSIS — E039 Hypothyroidism, unspecified: Secondary | ICD-10-CM | POA: Insufficient documentation

## 2017-12-20 HISTORY — DX: Cerebral infarction, unspecified: I63.9

## 2017-12-20 HISTORY — PX: TRIGGER FINGER RELEASE: SHX641

## 2017-12-20 SURGERY — RELEASE, A1 PULLEY, FOR TRIGGER FINGER
Anesthesia: Monitor Anesthesia Care | Site: Hand | Laterality: Left

## 2017-12-20 MED ORDER — FENTANYL CITRATE (PF) 100 MCG/2ML IJ SOLN
INTRAMUSCULAR | Status: AC
  Filled 2017-12-20: qty 2

## 2017-12-20 MED ORDER — BUPIVACAINE HCL (PF) 0.25 % IJ SOLN
INTRAMUSCULAR | Status: DC | PRN
  Administered 2017-12-20: 4 mL

## 2017-12-20 MED ORDER — BUPIVACAINE HCL (PF) 0.25 % IJ SOLN
INTRAMUSCULAR | Status: AC
  Filled 2017-12-20: qty 30

## 2017-12-20 MED ORDER — LIDOCAINE HCL (PF) 0.5 % IJ SOLN
INTRAMUSCULAR | Status: DC | PRN
  Administered 2017-12-20: 30 mL via INTRAVENOUS

## 2017-12-20 MED ORDER — SCOPOLAMINE 1 MG/3DAYS TD PT72: 1.0000 | MEDICATED_PATCH | Freq: Once | TRANSDERMAL | Status: DC | PRN

## 2017-12-20 MED ORDER — LACTATED RINGERS IV SOLN
INTRAVENOUS | Status: DC
  Administered 2017-12-20: 10:00:00 via INTRAVENOUS

## 2017-12-20 MED ORDER — FENTANYL CITRATE (PF) 100 MCG/2ML IJ SOLN
50.0000 ug | INTRAMUSCULAR | Status: DC | PRN
  Administered 2017-12-20: 50 ug via INTRAVENOUS

## 2017-12-20 MED ORDER — ONDANSETRON HCL 4 MG/2ML IJ SOLN
INTRAMUSCULAR | Status: DC | PRN
  Administered 2017-12-20: 4 mg via INTRAVENOUS

## 2017-12-20 MED ORDER — CHLORHEXIDINE GLUCONATE 4 % EX LIQD: 60.0000 mL | Freq: Once | CUTANEOUS | Status: DC

## 2017-12-20 MED ORDER — CEFAZOLIN SODIUM-DEXTROSE 2-4 GM/100ML-% IV SOLN
2.0000 g | INTRAVENOUS | Status: AC
  Administered 2017-12-20: 2 g via INTRAVENOUS

## 2017-12-20 MED ORDER — 0.9 % SODIUM CHLORIDE (POUR BTL) OPTIME
TOPICAL | Status: DC | PRN
  Administered 2017-12-20: 100 mL

## 2017-12-20 MED ORDER — MEPERIDINE HCL 25 MG/ML IJ SOLN: 6.2500 mg | INTRAMUSCULAR | Status: DC | PRN

## 2017-12-20 MED ORDER — HYDROMORPHONE HCL 1 MG/ML IJ SOLN: 0.2500 mg | INTRAMUSCULAR | Status: DC | PRN

## 2017-12-20 MED ORDER — PROPOFOL 500 MG/50ML IV EMUL
INTRAVENOUS | Status: DC | PRN
  Administered 2017-12-20: 50 ug/kg/min via INTRAVENOUS

## 2017-12-20 MED ORDER — MIDAZOLAM HCL 2 MG/2ML IJ SOLN: 1.0000 mg | INTRAMUSCULAR | Status: DC | PRN

## 2017-12-20 MED ORDER — ONDANSETRON HCL 4 MG/2ML IJ SOLN: 4.0000 mg | Freq: Once | INTRAMUSCULAR | Status: DC | PRN

## 2017-12-20 MED ORDER — CELECOXIB 200 MG PO CAPS: 200.0000 mg | ORAL_CAPSULE | Freq: Two times a day (BID) | ORAL | 0 refills | Status: DC

## 2017-12-20 MED ORDER — CEFAZOLIN SODIUM-DEXTROSE 2-4 GM/100ML-% IV SOLN
INTRAVENOUS | Status: AC
  Filled 2017-12-20: qty 100

## 2017-12-20 SURGICAL SUPPLY — 35 items
BANDAGE COBAN STERILE 2 (GAUZE/BANDAGES/DRESSINGS) ×2 IMPLANT
BLADE SURG 15 STRL LF DISP TIS (BLADE) ×1 IMPLANT
BLADE SURG 15 STRL SS (BLADE) ×1
BNDG ESMARK 4X9 LF (GAUZE/BANDAGES/DRESSINGS) IMPLANT
CHLORAPREP W/TINT 26ML (MISCELLANEOUS) ×2 IMPLANT
CORD BIPOLAR FORCEPS 12FT (ELECTRODE) IMPLANT
COVER BACK TABLE 60X90IN (DRAPES) ×2 IMPLANT
COVER MAYO STAND STRL (DRAPES) ×2 IMPLANT
CUFF TOURNIQUET SINGLE 18IN (TOURNIQUET CUFF) ×2 IMPLANT
DECANTER SPIKE VIAL GLASS SM (MISCELLANEOUS) IMPLANT
DRAPE EXTREMITY T 121X128X90 (DRAPE) ×2 IMPLANT
DRAPE SURG 17X23 STRL (DRAPES) ×2 IMPLANT
GAUZE SPONGE 4X4 12PLY STRL (GAUZE/BANDAGES/DRESSINGS) ×2 IMPLANT
GAUZE XEROFORM 1X8 LF (GAUZE/BANDAGES/DRESSINGS) ×2 IMPLANT
GLOVE BIO SURGEON STRL SZ7 (GLOVE) ×2 IMPLANT
GLOVE BIOGEL PI IND STRL 7.0 (GLOVE) ×1 IMPLANT
GLOVE BIOGEL PI IND STRL 8.5 (GLOVE) ×1 IMPLANT
GLOVE BIOGEL PI INDICATOR 7.0 (GLOVE) ×1
GLOVE BIOGEL PI INDICATOR 8.5 (GLOVE) ×1
GLOVE EXAM NITRILE MD LF STRL (GLOVE) ×2 IMPLANT
GLOVE SURG ORTHO 8.0 STRL STRW (GLOVE) ×2 IMPLANT
GLOVE SURG SS PI 7.0 STRL IVOR (GLOVE) ×2 IMPLANT
GOWN STRL REUS W/ TWL LRG LVL3 (GOWN DISPOSABLE) ×1 IMPLANT
GOWN STRL REUS W/ TWL XL LVL3 (GOWN DISPOSABLE) ×1 IMPLANT
GOWN STRL REUS W/TWL LRG LVL3 (GOWN DISPOSABLE) ×1
GOWN STRL REUS W/TWL XL LVL3 (GOWN DISPOSABLE) ×3 IMPLANT
NEEDLE PRECISIONGLIDE 27X1.5 (NEEDLE) ×2 IMPLANT
NS IRRIG 1000ML POUR BTL (IV SOLUTION) ×2 IMPLANT
PACK BASIN DAY SURGERY FS (CUSTOM PROCEDURE TRAY) ×2 IMPLANT
STOCKINETTE 4X48 STRL (DRAPES) ×2 IMPLANT
SUT ETHILON 4 0 PS 2 18 (SUTURE) ×2 IMPLANT
SYR BULB 3OZ (MISCELLANEOUS) ×2 IMPLANT
SYR CONTROL 10ML LL (SYRINGE) ×2 IMPLANT
TOWEL OR 17X24 6PK STRL BLUE (TOWEL DISPOSABLE) ×4 IMPLANT
UNDERPAD 30X30 (UNDERPADS AND DIAPERS) ×2 IMPLANT

## 2017-12-20 NOTE — Op Note (Signed)
[name], [name] NO.:  [id number]  MEDICAL RECORD NO.:  [id number]  LOCATION:                                 FACILITY:  PHYSICIAN:  Daryll Brod, M.D.            DATE OF BIRTH:  DATE OF PROCEDURE:  12/20/2017 DATE OF DISCHARGE:                              OPERATIVE REPORT   PREOPERATIVE DIAGNOSIS:  Stenosing tenosynovitis of left ring finger.  POSTOPERATIVE DIAGNOSES:  Stenosing tenosynovitis of left ring finger plus flexor sheath cyst excision of left ring finger.  OPERATION:  Excision of cyst with release of A1 pulley, left ring finger.  SURGEON:  Daryll Brod, MD.  ASSISTANT:  None.  ANESTHESIA:  Forearm IV regional with local infiltration, IV sedation.  PLACE OF SURGERY:  [facility].  ANESTHESIOLOGIST:  Crissie Sickles. Conrad Dubois, MD.  HISTORY:  The patient is a 77 year old female with a history of triggering of her left ring finger.  She has a Dupuytren's cord present. This has not responded to conservative treatment.  She has elected to undergo surgical release of the A1 pulley.  Pre, peri, and postoperative courses have been discussed along with risks and complications.  She is aware that there is no guarantee to the surgery, the possibility of infection, recurrence of injury to arteries, nerves, and tendons, incomplete relief of symptoms, and dystrophy.  In the preoperative area, the patient is seen, the extremity marked by both patient and surgeon, antibiotic given.  DESCRIPTION OF PROCEDURE:  The patient was brought to the operating room where a forearm-based IV regional anesthetic was carried out without difficulty.  She was prepped using ChloraPrep in a supine position with left arm free.  A 3-minute dry time was allowed and time-out was taken confirming the patient and procedure.  After adequate anesthesia was afforded, an oblique incision was made over the A1 pulley of the left ring finger, carried down through subcutaneous tissue.   A cyst was immediately encountered.  With blunt and sharp dissection, this was dissected free after placing retractors retracting the digital artery and nerve radially and ulnarly.  The cyst was sent to Pathology.  The A1 pulley was found to be markedly thickened.  This was distal to the insertion of the Dupuytren's cord.  The A1 pulley was released on its radial aspect.  A small incision was made centrally in A2.  Tenosynovial tissue proximally was separated.  The 2 tendons were separated removing any adhesions between the 2 tendons' tenosynovial tissue.  The finger was placed through full flexion passively.  No further triggering was noted.  The wound was copiously irrigated with saline.  The skin was closed with interrupted 4-0 nylon sutures.  A local infiltration with 0.25% bupivacaine without epinephrine was given.  Approximately 5 mL was used.  A sterile compressive dressing with fingers free was applied.  On deflation of the tourniquet, all fingers immediately pinked.  She was taken to the recovery room for observation in satisfactory condition.  She will be discharged to home, to return to [place] in 1 week, on Celebrex.          ______________________________ Daryll Brod,  M.D.     Aretta Nip  D:  12/20/2017  T:  12/20/2017  Job:  [id number]

## 2017-12-20 NOTE — Anesthesia Preprocedure Evaluation (Signed)
Anesthesia Evaluation  Patient identified by MRN, date of birth, ID band Patient awake    Reviewed: Allergy & Precautions, NPO status , Patient's Chart, lab work & pertinent test results  Airway Mallampati: I   Neck ROM: Full  Mouth opening: Limited Mouth Opening  Dental   Pulmonary    Pulmonary exam normal        Cardiovascular hypertension, Pt. on medications + CAD and + Past MI  Normal cardiovascular exam     Neuro/Psych    GI/Hepatic   Endo/Other    Renal/GU      Musculoskeletal   Abdominal   Peds  Hematology   Anesthesia Other Findings   Reproductive/Obstetrics                             Anesthesia Physical Anesthesia Plan  ASA: III  Anesthesia Plan: Bier Block and Bier Block-LIDOCAINE ONLY   Post-op Pain Management:    Induction: Intravenous  PONV Risk Score and Plan: 2 and Ondansetron and Treatment may vary due to age or medical condition  Airway Management Planned: Simple Face Mask  Additional Equipment:   Intra-op Plan:   Post-operative Plan:   Informed Consent: I have reviewed the patients History and Physical, chart, labs and discussed the procedure including the risks, benefits and alternatives for the proposed anesthesia with the patient or authorized representative who has indicated his/her understanding and acceptance.     Plan Discussed with: CRNA and Surgeon  Anesthesia Plan Comments:         Anesthesia Quick Evaluation

## 2017-12-20 NOTE — H&P (Signed)
[name] is an 77 y.o. female.   Chief Complaint: catching left ring fingerHPI: [name] is a 78 year old right-hand-dominant female comes with a complaint of a knot on her left ring finger. She states is been present for 10 months. She has no history of injury. She states that the whole finger feels painful with a sharp pain at the metacarpal phalangeal joint with a VAS score of 6/10. She has not had any treatment for this. She is of [place] descent. She states lifting seems to aggravate it. Doing crocheting also causes some discomfort for her. She does not complain of any numbness or tingling. She has no history of injury. She has no history of diabetes or gout. She has a history of thyroid problems and arthritis. Family history is negative for diabetes thyroid problems and gout. She is on Eliquis.She has had 2 injections to the ring finger left side. This continues to catch on her. It produces a moderate pain.           Past Medical History:  Diagnosis Date  . Arthritis    OSTEOARTHRITIS   -- CONSTANT PAIN RIGHT HIP---AND PAIN LEFT KNEE--PT STATES SHE GETS INJECTIONS INTO HER KNEE  . Complication of anesthesia    BLOOD PRESSURE DROPPED WITH NASAL SURGERY, ONE OF THE CARPAL TUNNEL REPAIRS AND DURING A COLONOSCOPY  . Dyslipidemia   . History of skin cancer   . Hypertension   . Hypothyroidism   . Menopausal symptoms   . NSTEMI (non-ST elevated myocardial infarction) ([place]) 12/09/15   Medical management: Distal branch of D1 95%, ostial D2 75%, distal LAD 50%. Tortuous arteries consistent with hypertension  . Ovarian cancer ([place]) 01/2013   Recurrence since 2014/2016  . PAF (paroxysmal atrial fibrillation) ([place])    On ELIQUIS; On Amiodarone (Eye Exam 12/25/15) - no longer on Rythmol  . Stroke [facility]) 2017   no deficits    Past Surgical History:  Procedure Laterality Date  . BILATERAL CARPAL TUNNEL REPAIR  2007  . BREAST BIOPSY Left 03/19/2014   benign  . CARDIAC CATHETERIZATION N/A  12/05/2015   Procedure: Left Heart Cath and Coronary Angiography;  Surgeon: Belva Crome, MD;  Location: [place] [facility];  Service: Cardiovascular;  distal branch of D1 95%, ostial D2 75%, dLAD 50%,p-mRCA 40%. Tortuous vessels.  Marland Kitchen DILATION AND CURETTAGE OF UTERUS  1969  . JOINT REPLACEMENT    . LAPAROTOMY Bilateral 02/13/2013   Procedure: EXPLORATORY LAPAROTOMY TOTAL ABDOMINAL HYSTERECTOMY BILATERAL SALPINGO-OOPHORECTOMY, Partial Rectal Resection with Reanastamosis;  Surgeon: Alvino Chapel, MD;  Location: [facility];  Service: Gynecology;  Laterality: Bilateral;  . LEFT KNEE ARTHROSCOPY   2011  . LYMPHADENECTOMY Right 02/13/2013   Procedure: PEVLIC  LYMPHADENECTOMY, DEBULKING right pelvic tumor nodules;  Surgeon: Alvino Chapel, MD;  Location: [facility];  Service: Gynecology;  Laterality: Right;  . NM MYOVIEW LTD  June 2010    subbmaximal with no ischemia or infarction.  . OMENTECTOMY  02/13/2013   Procedure: OMENTECTOMY;  Surgeon: Alvino Chapel, MD;  Location: [facility];  Service: Gynecology;;  . [place]  . SURGERY FOR RUPTURED OVARIAN CYST  1969  . SYNOVECTOMY WITH POLY EXCHANGE Left 03/18/2017   Procedure: LEFT KNEE PARTIAL SYNOVECTOMY WITH POLY EXCHANGE;  Surgeon: Mcarthur Rossetti, MD;  Location: [facility];  Service: Orthopedics;  Laterality: Left;  Adductor Block  . TAH/BSO/Tumor debulking with right pelvic LND  01/2013  . TONSILLECTOMY  1962  . TOTAL HIP  ARTHROPLASTY  02/25/2012   Procedure: TOTAL HIP ARTHROPLASTY ANTERIOR APPROACH;  Surgeon: Mcarthur Rossetti, MD;  Location: [facility];  Service: Orthopedics;  Laterality: Right;  . TOTAL KNEE ARTHROPLASTY Left 01/03/2015   Procedure: LEFT TOTAL KNEE ARTHROPLASTY;  Surgeon: Mcarthur Rossetti, MD;  Location: [facility];  Service: Orthopedics;  Laterality: Left;  . TRANSTHORACIC ECHOCARDIOGRAM  May 2014; January 2016   a. Normal LV size function. EF 60-65%. Grade 1 diastolic function. Mild MR  and mildly elevated PA pressures of 37 mmHg per;; b. EF 60-65%. Mobile echodensity 10 mm x 6 mm attest interventricular septum is questionable fibroblastoma.    Family History  Problem Relation Age of Onset  . Hypertension Mother   . Basal cell carcinoma Mother   . AAA (abdominal aortic aneurysm) Father   . Heart Problems Maternal Uncle   . Heart Problems Maternal Grandfather   . AAA (abdominal aortic aneurysm) Paternal Grandmother   . Prostate cancer Maternal Uncle 70  . Prostate cancer Other   . Other Daughter        one daughter had TAH-BSO at 81; other daughter will have one soon   Social History:  reports that  has never smoked. she has never used smokeless tobacco. She reports that she does not drink alcohol or use drugs.  Allergies:  Allergies  Allergen Reactions  . Clonidine Derivatives Shortness Of Breath  . Coreg [Carvedilol] Shortness Of Breath  . Caffeine Other (See Comments)    Makes heart race  . Codeine Other (See Comments)    Does not like the feeling she gets  . Flexeril [Cyclobenzaprine] Other (See Comments)    Pt states "increased heart rate"  . Lipitor [Atorvastatin] Dermatitis    "feels like bugs are biting her"   . Lisinopril     LIP NUMBNESS  . Pravastatin Other (See Comments)    FEELS LIKE "BUG BITES" BITING HER LEGS   . Tape Rash    TEGADERM.   (use opsite on PAC)  . Tegaderm Ag Mesh [Silver] Rash and Other (See Comments)    Burns skin  . Ultram [Tramadol] Palpitations    No medications prior to admission.    No results found. However, due to the size of the patient record, not all encounters were searched. Please check Results Review for a complete set of results.  No results found.   Pertinent items are noted in HPI.  Height 5\' 3"  (1.6 m), weight 59.9 kg (132 lb).  General appearance: alert, cooperative and appears stated age Head: Normocephalic, without obvious abnormality Neck: no JVD Resp: clear to auscultation  bilaterally Cardio: regular rate and rhythm, S1, S2 normal, no murmur, click, rub or gallop GI: soft, non-tender; bowel sounds normal; no masses,  no organomegaly Extremities: catching left ring finger Pulses: 2+ and symmetric Skin: Skin color, texture, turgor normal. No rashes or lesions Neurologic: Grossly normal Incision/Wound: na  Assessment/Plan Assessment:  1. Trigger ring finger of left hand  2. Trigger ring finger of right hand  3. Osteoarthritis of finger of left hand    Plan: We have discussed possibility of release of the A1 pulley with left ring finger. We would not recommend doing anything to the Dupuytren's cords unless is causing constriction of the A1 pulley. This may require partial resection of the cord. Pre-peri-and postoperative course are discussed along with risks and complications. She is aware there is no guarantee to the surgery the possibility of infection recurrence injury to arteries nerves tendons incomplete  release symptoms possibility of recurrence. She is scheduled for release A1 pulley left ring finger as an outpatient under regional anesthesia.      Laurali Goddard R 12/20/2017, 4:47 AM

## 2017-12-20 NOTE — Transfer of Care (Signed)
Immediate Anesthesia Transfer of Care Note  Patient: [name]  Procedure(s) Performed: RELEASE TRIGGER FINGER/A-1 PULLEY LEFT RING FINGER (Left Hand)  Patient Location: PACU  Anesthesia Type:Bier block  Level of Consciousness: awake, alert , oriented and patient cooperative  Airway & Oxygen Therapy: Patient Spontanous Breathing and Patient connected to face mask oxygen  Post-op Assessment: Report given to RN and Post -op Vital signs reviewed and stable  Post vital signs: Reviewed and stable  Last Vitals:  Vitals:   12/20/17 0912  BP: 140/80  Pulse: 65  Resp: 18  Temp: 36.7 C  SpO2: 100%    Last Pain:  Vitals:   12/20/17 0912  TempSrc: Oral         Complications: No apparent anesthesia complications

## 2017-12-20 NOTE — Brief Op Note (Signed)
12/20/2017  10:09 AM  PATIENT:  [name]  77 y.o. female  PRE-OPERATIVE DIAGNOSIS:  TRIGGER FINGER OF LEFT RING FINGER  POST-OPERATIVE DIAGNOSIS:  TRIGGER FINGER OF LEFT RING FINGER  PROCEDURE:  Procedure(s): RELEASE TRIGGER FINGER/A-1 PULLEY LEFT RING FINGER (Left)  SURGEON:  Surgeon(s) and Role:    * Daryll Brod, MD - Primary  PHYSICIAN ASSISTANT:   ASSISTANTS: none   ANESTHESIA:   local, regional and IV sedation  EBL: none BLOOD ADMINISTERED:none  DRAINS: none   LOCAL MEDICATIONS USED:  BUPIVICAINE   SPECIMEN:  Excision  DISPOSITION OF SPECIMEN:  PATHOLOGY  COUNTS:  YES  TOURNIQUET:   Total Tourniquet Time Documented: Forearm (Left) - 20 minutes Total: Forearm (Left) - 20 minutes   DICTATION: .Other Dictation: Dictation Number [id number]  PLAN OF CARE: Discharge to home after PACU  PATIENT DISPOSITION:  PACU - hemodynamically stable.

## 2017-12-20 NOTE — Anesthesia Procedure Notes (Signed)
Anesthesia Regional Block: Bier block (IV Regional)   Pre-Anesthetic Checklist: ,, timeout performed, Correct Patient, Correct Site, Correct Laterality, Correct Procedure,, site marked, surgical consent,, at surgeon's request  Laterality: Left     Needles:  Injection technique: Single-shot  Needle Type: Other      Needle Gauge: 22     Additional Needles:   Procedures:,,,,, intact distal pulses, Esmarch exsanguination, single tourniquet utilized,  Narrative:   Performed by: Personally       

## 2017-12-20 NOTE — Anesthesia Postprocedure Evaluation (Signed)
Anesthesia Post Note  Patient: [name]  Procedure(s) Performed: RELEASE TRIGGER FINGER/A-1 PULLEY LEFT RING FINGER (Left Hand)     Patient location during evaluation: PACU Anesthesia Type: Bier Block Level of consciousness: awake and alert Pain management: pain level controlled Vital Signs Assessment: post-procedure vital signs reviewed and stable Respiratory status: spontaneous breathing, nonlabored ventilation, respiratory function stable and patient connected to nasal cannula oxygen Cardiovascular status: stable and blood pressure returned to baseline Postop Assessment: no apparent nausea or vomiting Anesthetic complications: no    Last Vitals:  Vitals:   12/20/17 1045 12/20/17 1106  BP: 136/63 137/78  Pulse: 61 67  Resp: (!) 6 14  Temp:  36.6 C  SpO2: 99% 98%    Last Pain:  Vitals:   12/20/17 1106  TempSrc:   PainSc: 0-No pain                 Johnjoseph Rolfe DAVID

## 2017-12-20 NOTE — Anesthesia Procedure Notes (Signed)
Procedure Name: MAC Date/Time: 12/20/2017 9:55 AM Performed by: Signe Colt, CRNA Pre-anesthesia Checklist: Patient identified, Emergency Drugs available, Suction available, Patient being monitored and Timeout performed Patient Re-evaluated:Patient Re-evaluated prior to induction Oxygen Delivery Method: Simple face mask

## 2017-12-20 NOTE — Discharge Instructions (Addendum)

## 2017-12-22 ENCOUNTER — Encounter (HOSPITAL_BASED_OUTPATIENT_CLINIC_OR_DEPARTMENT_OTHER): Payer: Self-pay | Admitting: Orthopedic Surgery

## 2017-12-26 ENCOUNTER — Ambulatory Visit (INDEPENDENT_AMBULATORY_CARE_PROVIDER_SITE_OTHER): Payer: Medicare Other | Admitting: Physical Medicine and Rehabilitation

## 2017-12-26 ENCOUNTER — Encounter (INDEPENDENT_AMBULATORY_CARE_PROVIDER_SITE_OTHER): Payer: Self-pay | Admitting: Physical Medicine and Rehabilitation

## 2017-12-26 ENCOUNTER — Ambulatory Visit (INDEPENDENT_AMBULATORY_CARE_PROVIDER_SITE_OTHER): Payer: Medicare Other

## 2017-12-26 VITALS — BP 119/69 | HR 65 | Temp 98.1°F

## 2017-12-26 DIAGNOSIS — M47816 Spondylosis without myelopathy or radiculopathy, lumbar region: Secondary | ICD-10-CM

## 2017-12-26 MED ORDER — METHYLPREDNISOLONE ACETATE 80 MG/ML IJ SUSP
80.0000 mg | Freq: Once | INTRAMUSCULAR | Status: AC
  Administered 2017-12-26: 80 mg

## 2017-12-26 NOTE — Progress Notes (Deleted)
Pt states a pain in lower back on the right side. Pt states pain has been going on 2 weeks after last injection 10/10/17. Pt states last injection helped, but did not last. Pt states standing and sitting makes pain worse, pt states sitting still eases pain. +Driver, -BT (eliquis), -Dye Allergies.

## 2017-12-26 NOTE — Procedures (Signed)
[name] is a 77 year old female with chronic history of right low back pain she has had prior hip replacement.  We have completed facet joint block a few months ago, on 10/10/2017 with good relief of what she referred to as her hip pain but she still getting some lower back pain.  She points in the area of the L3 and L4 vertebral bodies.  There are no focal trigger points on exam.  She does have MRI evidence of degenerative scoliosis and facet arthropathy particularly on the right.  I think the best approach is diagnostic and hopefully therapeutic right L3-4 and L4-5 facet joint blocks.  Could look at radiofrequency ablation in the future.  Her case is complicated by history of metastatic ovarian cancer.  Lumbar Facet Joint Intra-Articular Injection(s) with Fluoroscopic Guidance  Patient: [name]      Date of Birth: 1941-10-13 MRN: [id number] PCP: Shon Baton, MD      Visit Date: 12/26/2017   Universal Protocol:    Date/Time: 12/26/2017  Consent Given By: the patient  Position: PRONE   Additional Comments: Vital signs were monitored before and after the procedure. Patient was prepped and draped in the usual sterile fashion. The correct patient, procedure, and site was verified.   Injection Procedure Details:  Procedure Site One Meds Administered:  Meds ordered this encounter  Medications  . methylPREDNISolone acetate (DEPO-MEDROL) injection 80 mg     Laterality: Right  Location/Site:  L3-L4 L4-L5  Needle size: 22 guage  Needle type: Spinal  Needle Placement: Articular  Findings:  -Comments: Excellent flow of contrast producing a partial arthrogram.  Procedure Details: The fluoroscope beam is vertically oriented in AP, and the inferior recess is visualized beneath the lower pole of the inferior apophyseal process, which represents the target point for needle insertion. When direct visualization is difficult the target point is located at the medial  projection of the vertebral pedicle. The region overlying each aforementioned target is locally anesthetized with a 1 to 2 ml. volume of 1% Lidocaine without Epinephrine.   The spinal needle was inserted into each of the above mentioned facet joints using biplanar fluoroscopic guidance. A 0.25 to 0.5 ml. volume of Isovue-250 was injected and a partial facet joint arthrogram was obtained. A single spot film was obtained of the resulting arthrogram.    One to 1.25 ml of the steroid/anesthetic solution was then injected into each of the facet joints noted above.   Additional Comments:  The patient tolerated the procedure well Dressing: Band-Aid    Post-procedure details: Patient was observed during the procedure. Post-procedure instructions were reviewed.  Patient left the clinic in stable condition.  Pertinent Imaging: MRI LUMBAR SPINE WITHOUT CONTRAST  TECHNIQUE: Multiplanar, multisequence MR imaging of the lumbar spine was performed. No intravenous contrast was administered.  COMPARISON:  Lumbar MRI 03/08/2014  FINDINGS: Segmentation:  Normal  Alignment: Mild retrolisthesis T12-L1. 4 mm retrolisthesis L1-2. Mild retrolisthesis L2-3. Lumbar scoliosis convex to the right L1-2.  Vertebrae:  Negative for fracture or mass.  Conus medullaris: Extends to the L2 level and appears normal.  Paraspinal and other soft tissues: Negative  Disc levels:  T12-L1: Disc degeneration and spurring on the left with left foraminal narrowing  L1-2: Disc degeneration and spurring on the left with left foraminal narrowing. Left-sided facet hypertrophy. Left subarticular narrowing.  L2-3: Disc bulging and diffuse endplate spurring. Mild facet degeneration and mild spinal stenosis with progression from the prior study.  L3-4: Disc degeneration with diffuse endplate  spurring and facet degeneration. Mild narrowing of the canal without significant stenosis  L4-5: Disc degeneration  and spurring on the right. Mild subarticular stenosis on the right  L5-S1: Disc degeneration and diffuse endplate spurring. Mild foraminal narrowing bilaterally.  IMPRESSION: Left foraminal narrowing T12-L1 and L1-2 due to spurring  Mild spinal stenosis L2-3 with progression from the prior study  Mild subarticular stenosis on the right L4-5.  Mild foraminal narrowing bilaterally L5-S1.   Electronically Signed   By: Franchot Gallo M.D.   On: 09/13/2017 14:26

## 2017-12-26 NOTE — Patient Instructions (Signed)

## 2018-01-06 ENCOUNTER — Telehealth (INDEPENDENT_AMBULATORY_CARE_PROVIDER_SITE_OTHER): Payer: Self-pay | Admitting: Physical Medicine and Rehabilitation

## 2018-01-06 ENCOUNTER — Inpatient Hospital Stay: Payer: Medicare Other

## 2018-01-06 ENCOUNTER — Inpatient Hospital Stay: Payer: Medicare Other | Attending: Gynecology

## 2018-01-06 ENCOUNTER — Other Ambulatory Visit: Payer: Self-pay | Admitting: Gynecologic Oncology

## 2018-01-06 DIAGNOSIS — Z9221 Personal history of antineoplastic chemotherapy: Secondary | ICD-10-CM | POA: Diagnosis not present

## 2018-01-06 DIAGNOSIS — Z90722 Acquired absence of ovaries, bilateral: Secondary | ICD-10-CM | POA: Insufficient documentation

## 2018-01-06 DIAGNOSIS — Z90710 Acquired absence of both cervix and uterus: Secondary | ICD-10-CM | POA: Insufficient documentation

## 2018-01-06 DIAGNOSIS — C569 Malignant neoplasm of unspecified ovary: Secondary | ICD-10-CM

## 2018-01-06 DIAGNOSIS — Z79810 Long term (current) use of selective estrogen receptor modulators (SERMs): Secondary | ICD-10-CM | POA: Diagnosis not present

## 2018-01-06 DIAGNOSIS — C7989 Secondary malignant neoplasm of other specified sites: Secondary | ICD-10-CM

## 2018-01-06 DIAGNOSIS — Z95828 Presence of other vascular implants and grafts: Secondary | ICD-10-CM

## 2018-01-06 MED ORDER — HEPARIN SOD (PORK) LOCK FLUSH 100 UNIT/ML IV SOLN
500.0000 [IU] | Freq: Once | INTRAVENOUS | Status: AC
  Administered 2018-01-06: 500 [IU] via INTRAVENOUS
  Filled 2018-01-06: qty 5

## 2018-01-06 MED ORDER — SODIUM CHLORIDE 0.9% FLUSH
10.0000 mL | INTRAVENOUS | Status: AC | PRN
  Administered 2018-01-06: 10 mL via INTRAVENOUS
  Filled 2018-01-06: qty 10

## 2018-01-06 NOTE — Addendum Note (Signed)
Addended by: Smiley Houseman F on: 01/06/2018 12:35 PM   Modules accepted: Orders, SmartSet

## 2018-01-07 LAB — CA 125: CANCER ANTIGEN (CA) 125: 84.4 U/mL — AB (ref 0.0–38.1)

## 2018-01-09 NOTE — Telephone Encounter (Signed)
Closing encounter

## 2018-01-10 ENCOUNTER — Encounter: Payer: Self-pay | Admitting: Gynecology

## 2018-01-10 ENCOUNTER — Other Ambulatory Visit: Payer: Medicare Other

## 2018-01-10 ENCOUNTER — Inpatient Hospital Stay (HOSPITAL_BASED_OUTPATIENT_CLINIC_OR_DEPARTMENT_OTHER): Payer: Medicare Other | Admitting: Gynecology

## 2018-01-10 VITALS — BP 138/61 | HR 68 | Temp 97.8°F | Resp 20 | Wt 133.9 lb

## 2018-01-10 DIAGNOSIS — Z9221 Personal history of antineoplastic chemotherapy: Secondary | ICD-10-CM

## 2018-01-10 DIAGNOSIS — Z79810 Long term (current) use of selective estrogen receptor modulators (SERMs): Secondary | ICD-10-CM

## 2018-01-10 DIAGNOSIS — Z90722 Acquired absence of ovaries, bilateral: Secondary | ICD-10-CM | POA: Diagnosis not present

## 2018-01-10 DIAGNOSIS — Z90710 Acquired absence of both cervix and uterus: Secondary | ICD-10-CM

## 2018-01-10 DIAGNOSIS — C569 Malignant neoplasm of unspecified ovary: Secondary | ICD-10-CM | POA: Diagnosis not present

## 2018-01-10 NOTE — Progress Notes (Signed)
Consult Note: Gyn-Onc   [name] 77 y.o. female  Chief Complaint  Patient presents with  . 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   The patient is tolerating tamoxifen very well with no side effects except for a thin watery vaginal discharge and some hot flushes which she says are very tolerable.  She specifically has no GI or GU symptoms except for intermittent constipation.  I am happy with the Ca1 25 value of 84 (previously 76 units/mL) and we will therefore continue tamoxifen and the patient return in 2 months.  We will obtain a Ca1 25 just prior to that visit.  Treatment options we might consider in the future would include returning to cytotoxic chemotherapy (Doxil, gemcitabine, Topotecan, Taxotere, carboplatin, and Taxol).  PARP inhibitors were also discussed (the patient is BRCA negative).   Interval History:   The patient returns today as previously scheduled.   She has been taking tamoxifen for the past 2 months and tolerating it very well except for slight vaginal discharge and continued hot flushes (which she had previously).  Ca1 25 this week was 84 units/mL.  Previously it was 76 units/mL which I view as stable disease.  The patient denies any GI GU or pelvic symptoms.  Her functional status is good.Marland Kitchen  HPI:The patient initially presented with a pelvic mass and elevated CA 125 (178 units per mL) She underwent exploratory laparotomy and debulking on 02/13/2013. Final pathology showed a poorly differentiated ovarian cancer involving both ovaries pelvic peritoneum and rectal muscularis. All gross disease was resected.  She then received 6 cycles of carboplatin and Taxol chemotherapy completed in September 2014. At the completion of chemotherapy her CA 125 was 18 units per mL.   In May 2016 the patient CA-125 began to rise and a PET CT scan showed metastatic disease in the  spleen and pelvic lymph nodes. Given the long platinum free interval , we retreated with carboplatin, Taxol, and a Avastin. She received 6 cycles of carboplatin and Taxol and a Avastin the last being administered in September 2016. She had a significant response with CA 125 falling to 12 units per mL in September. CT scan showed persistent disease in the spleen (which was actually responding). Patient received a total 8 cycles and on follow-up in December 2016 had persistent disease in the spleen which had slightly decreased in size. At that time CA-125 is 14 units per mL.  In March 2017 we switch the patient to letrozole.   A CT scan obtained 06/23/2016 showed a response with the spleen met measure 1.5 x 1.5 cm (previously 2.4 x 3.9 cm). Because of side effects resolve was discontinued in November 2017. A follow-up CT scan on 12/01/2016 showed no evidence of the splenic lesion and her CA-125 at that time was 14 units per mL.  In May 2018 CA-125 rose to 24 units per mL and a follow-up CT scan showed new lesions in the gastrosplenic ligament ( 2.5 cm) and a lesion in the left pelvis measuring 2.4 cm. Because she had previously responded to Letrozole ( although she did not tolerate let resolve very well) we began Aromasin 25 mg daily.  December 2018 we switched to tamoxifen 20 mg a day.   Review of Systems:10 point review of systems is negative except as noted in interval history.   Vitals: Blood pressure 138/61, pulse 68, temperature 97.8 F (36.6 C),  temperature source Oral, resp. rate 20, weight 133 lb 14.4 oz (60.7 kg), SpO2 100 %.  Physical Exam: General : The patient is a healthy woman in no acute distress.  HEENT: normocephalic, extraoccular movements normal; neck is supple without thyromegally , Port-A-Cath appears healthy is no evidence of infection in the right chest wall. Lynphnodes: Supraclavicular and inguinal nodes not enlarged  Abdomen: Soft, non-tender, no ascites, no organomegally,  no masses, no hernias , midline incision is healing well  Pelvic:    EGBUS: Normal female  Vagina bladder urethra: Normal  Vaginal cuff is well healed.  Cervix and uterus are surgically absent  Bimanual exam: No masses nodularity or fullness.  Rectovaginal exam confirms      Lower extremities: No edema or varicosities. Normal range of motion      Allergies  Allergen Reactions  . Clonidine Derivatives Shortness Of Breath  . Coreg [Carvedilol] Shortness Of Breath  . Caffeine Other (See Comments)    Makes heart race  . Codeine Other (See Comments)    Does not like the feeling she gets  . Flexeril [Cyclobenzaprine] Other (See Comments)    Pt states "increased heart rate"  . Lipitor [Atorvastatin] Dermatitis    "feels like bugs are biting her"   . Lisinopril     LIP NUMBNESS  . Pravastatin Other (See Comments)    FEELS LIKE "BUG BITES" BITING HER LEGS   . Tape Rash    TEGADERM.   (use opsite on PAC)  . Tegaderm Ag Mesh [Silver] Rash and Other (See Comments)    Burns skin  . Ultram [Tramadol] Palpitations    Past Medical History:  Diagnosis Date  . Arthritis    OSTEOARTHRITIS   -- CONSTANT PAIN RIGHT HIP---AND PAIN LEFT KNEE--PT STATES SHE GETS INJECTIONS INTO HER KNEE  . Complication of anesthesia    BLOOD PRESSURE DROPPED WITH NASAL SURGERY, ONE OF THE CARPAL TUNNEL REPAIRS AND DURING A COLONOSCOPY  . Dyslipidemia   . History of skin cancer   . Hypertension   . Hypothyroidism   . Menopausal symptoms   . NSTEMI (non-ST elevated myocardial infarction) ([place]) 12/09/15   Medical management: Distal branch of D1 95%, ostial D2 75%, distal LAD 50%. Tortuous arteries consistent with hypertension  . Ovarian cancer ([place]) 01/2013   Recurrence since 2014/2016  . PAF (paroxysmal atrial fibrillation) ([place])    On ELIQUIS; On Amiodarone (Eye Exam 12/25/15) - no longer on Rythmol  . Stroke [facility]) 2017   no deficits    Past Surgical History:  Procedure Laterality Date  .  BILATERAL CARPAL TUNNEL REPAIR  2007  . BREAST BIOPSY Left 03/19/2014   benign  . CARDIAC CATHETERIZATION N/A 12/05/2015   Procedure: Left Heart Cath and Coronary Angiography;  Surgeon: Belva Crome, MD;  Location: [facility];  Service: Cardiovascular;  distal branch of D1 95%, ostial D2 75%, dLAD 50%,p-mRCA 40%. Tortuous vessels.  Marland Kitchen DILATION AND CURETTAGE OF UTERUS  1969  . JOINT REPLACEMENT    . LAPAROTOMY Bilateral 02/13/2013   Procedure: EXPLORATORY LAPAROTOMY TOTAL ABDOMINAL HYSTERECTOMY BILATERAL SALPINGO-OOPHORECTOMY, Partial Rectal Resection with Reanastamosis;  Surgeon: Alvino Chapel, MD;  Location: [facility];  Service: Gynecology;  Laterality: Bilateral;  . LEFT KNEE ARTHROSCOPY   2011  . LYMPHADENECTOMY Right 02/13/2013   Procedure: PEVLIC  LYMPHADENECTOMY, DEBULKING right pelvic tumor nodules;  Surgeon: Alvino Chapel, MD;  Location: [facility];  Service: Gynecology;  Laterality: Right;  . NM MYOVIEW LTD  June 2010  subbmaximal with no ischemia or infarction.  . OMENTECTOMY  02/13/2013   Procedure: OMENTECTOMY;  Surgeon: Alvino Chapel, MD;  Location: [facility];  Service: Gynecology;;  . [place]  . SURGERY FOR RUPTURED OVARIAN CYST  1969  . SYNOVECTOMY WITH POLY EXCHANGE Left 03/18/2017   Procedure: LEFT KNEE PARTIAL SYNOVECTOMY WITH POLY EXCHANGE;  Surgeon: Mcarthur Rossetti, MD;  Location: [facility];  Service: Orthopedics;  Laterality: Left;  Adductor Block  . TAH/BSO/Tumor debulking with right pelvic LND  01/2013  . TONSILLECTOMY  1962  . TOTAL HIP ARTHROPLASTY  02/25/2012   Procedure: TOTAL HIP ARTHROPLASTY ANTERIOR APPROACH;  Surgeon: Mcarthur Rossetti, MD;  Location: [facility];  Service: Orthopedics;  Laterality: Right;  . TOTAL KNEE ARTHROPLASTY Left 01/03/2015   Procedure: LEFT TOTAL KNEE ARTHROPLASTY;  Surgeon: Mcarthur Rossetti, MD;  Location: [facility];  Service: Orthopedics;  Laterality: Left;  . TRANSTHORACIC  ECHOCARDIOGRAM  May 2014; January 2016   a. Normal LV size function. EF 60-65%. Grade 1 diastolic function. Mild MR and mildly elevated PA pressures of 37 mmHg per;; b. EF 60-65%. Mobile echodensity 10 mm x 6 mm attest interventricular septum is questionable fibroblastoma.  Marland Kitchen TRIGGER FINGER RELEASE Left 12/20/2017   Procedure: RELEASE TRIGGER FINGER/A-1 PULLEY LEFT RING FINGER;  Surgeon: Daryll Brod, MD;  Location: [facility];  Service: Orthopedics;  Laterality: Left;    Current Outpatient Medications  Medication Sig Dispense Refill  . acetaminophen (TYLENOL) 500 MG tablet Take 1,000 mg by mouth every 6 (six) hours as needed for mild pain.     Marland Kitchen amiodarone (PACERONE) 200 MG tablet Take 100 mg by mouth daily.  3  . amoxicillin (AMOXIL) 500 MG capsule Take 2,000 mg by mouth See admin instructions. Takes 4 capsules 1 hour prior to procedure    . Biotin 10 MG TABS Take 10 mg by mouth daily.     Marland Kitchen desonide (DESOWEN) 0.05 % cream Apply 1 application topically 2 (two) times daily.   1  . diphenhydrAMINE (BENADRYL) 25 MG tablet Take 50 mg by mouth at bedtime as needed for sleep.     Marland Kitchen docusate sodium (COLACE) 100 MG capsule Take 250 mg by mouth 2 (two) times daily.     Marland Kitchen ELIQUIS 5 MG TABS tablet TAKE 1 TABLET (5 MG TOTAL) BY MOUTH 2 (TWO) TIMES DAILY. 180 tablet 1  . Evolocumab with Infusor 420 MG/3.5ML SOCT Inject into the skin.    Marland Kitchen ezetimibe (ZETIA) 10 MG tablet Take 1 tablet (10 mg total) by mouth daily. (Patient taking differently: Take 10 mg by mouth at bedtime. ) 30 tablet 6  . Glucosamine HCl 1000 MG TABS Take 1,000 mg by mouth daily.    . hydrocortisone 1 % ointment Apply 1 application topically 2 (two) times daily as needed for itching (eczema).     Marland Kitchen ipratropium (ATROVENT) 0.06 % nasal spray Place 2 sprays into both nostrils daily.   5  . irbesartan (AVAPRO) 300 MG tablet TAKE 1 TABLET (300 MG TOTAL) BY MOUTH DAILY. 90 tablet 1  . levothyroxine (SYNTHROID) 88 MCG tablet Take 88  mcg by mouth daily before breakfast.    . lidocaine-prilocaine (EMLA) cream Apply to Porta-Cath site 1-2 hours prior to access as directed. (Patient taking differently: Apply 1 application topically See admin instructions. Apply to Porta-Cath site 1-2 hours prior to access as directed.) 30 g 2  . loratadine (CLARITIN) 5 MG chewable tablet Chew 5 mg  daily by mouth.    . magnesium hydroxide (MILK OF MAGNESIA) 800 MG/5ML suspension Take 30 mLs by mouth daily as needed for constipation. Reported on 11/20/2015    . Melatonin 5 MG CAPS Take 5 mg by mouth at bedtime as needed (sleep). Reported on 12/25/2015    . Menthol, Topical Analgesic, (ICY HOT EX) Apply 1 application topically 2 (two) times daily as needed (PAIN).     . Multiple Vitamin (MULTIVITAMIN WITH MINERALS) TABS tablet Take 1 tablet by mouth daily. Centrum Silver    . OVER THE COUNTER MEDICATION Co210 20 mg once a day for muscle aches.    Vladimir Faster Glycol-Propyl Glycol (SYSTANE OP) Place 1 drop into both eyes 2 (two) times daily.     . polyethylene glycol (MIRALAX / GLYCOLAX) packet Take 17 g by mouth daily. Mix in 8 oz liquid and drink    . potassium chloride (K-DUR) 10 MEQ tablet Take 10 mEq by mouth every Monday, Wednesday, and Friday.    Marland Kitchen SHINGRIX injection     . spironolactone (ALDACTONE) 25 MG tablet Take 25 mg by mouth See admin instructions. Takes on Tuesdays, Thursday, Saturday, Sunday    . tamoxifen (NOLVADEX) 20 MG tablet Take 1 tablet (20 mg total) by mouth daily. 30 tablet 6   No current facility-administered medications for this visit.    Facility-Administered Medications Ordered in Other Visits  Medication Dose Route Frequency Provider Last Rate Last Dose  . sodium chloride flush (NS) 0.9 % injection 10 mL  10 mL Intravenous PRN Joylene John D, NP   10 mL at 01/06/18 1220    Social History   Socioeconomic History  . Marital status: Married    Spouse name: Not on file  . Number of children: Not on file  . Years of  education: Not on file  . Highest education level: Not on file  Social Needs  . Financial resource strain: Not on file  . Food insecurity - worry: Not on file  . Food insecurity - inability: Not on file  . Transportation needs - medical: Not on file  . Transportation needs - non-medical: Not on file  Occupational History  . Not on file  Tobacco Use  . Smoking status: Never Smoker  . Smokeless tobacco: Never Used  Substance and Sexual Activity  . Alcohol use: No    Alcohol/week: 0.0 oz  . Drug use: No  . Sexual activity: Not Currently  Other Topics Concern  . Not on file  Social History Narrative   Married mother of 3, grandmother 33.   Exercises 6/7 days a week walking 30 minutes a time.   Never smoked or drank alcohol.    Family History  Problem Relation Age of Onset  . Hypertension Mother   . Basal cell carcinoma Mother   . AAA (abdominal aortic aneurysm) Father   . Heart Problems Maternal Uncle   . Heart Problems Maternal Grandfather   . AAA (abdominal aortic aneurysm) Paternal Grandmother   . Prostate cancer Maternal Uncle 70  . Prostate cancer Other   . Other Daughter        one daughter had TAH-BSO at 50; other daughter will have one soon      Marti Sleigh, MD 01/10/2018, 10:05 AM

## 2018-01-10 NOTE — Patient Instructions (Signed)
Plan on following up in two months with a CA 125 before.  Continue with Tamoxifen.  Please call for any questions, concerns, or new symptoms.

## 2018-01-13 ENCOUNTER — Ambulatory Visit: Payer: Medicare Other | Admitting: Gynecology

## 2018-01-19 ENCOUNTER — Telehealth: Payer: Self-pay | Admitting: *Deleted

## 2018-01-19 NOTE — Telephone Encounter (Signed)
Representative from [facility] left a msg on the refill vm stating that the patients last repatha shipment was defective. They will replace it at no cost however as it will be coming from the manufacturer a new rx is required. Please call [phone] to provide a verbal order, fax to [phone] or submit via escribe to [facility]. Thanks, MI

## 2018-01-19 NOTE — Telephone Encounter (Signed)
Rx given verbally to [facility]

## 2018-02-01 DIAGNOSIS — M20031 Swan-neck deformity of right finger(s): Secondary | ICD-10-CM | POA: Insufficient documentation

## 2018-02-14 ENCOUNTER — Inpatient Hospital Stay: Payer: Medicare Other | Attending: Gynecology

## 2018-02-14 DIAGNOSIS — Z9221 Personal history of antineoplastic chemotherapy: Secondary | ICD-10-CM | POA: Diagnosis not present

## 2018-02-14 DIAGNOSIS — C7989 Secondary malignant neoplasm of other specified sites: Secondary | ICD-10-CM

## 2018-02-14 DIAGNOSIS — Z90722 Acquired absence of ovaries, bilateral: Secondary | ICD-10-CM | POA: Insufficient documentation

## 2018-02-14 DIAGNOSIS — Z79810 Long term (current) use of selective estrogen receptor modulators (SERMs): Secondary | ICD-10-CM | POA: Diagnosis not present

## 2018-02-14 DIAGNOSIS — Z452 Encounter for adjustment and management of vascular access device: Secondary | ICD-10-CM | POA: Insufficient documentation

## 2018-02-14 DIAGNOSIS — C569 Malignant neoplasm of unspecified ovary: Secondary | ICD-10-CM | POA: Diagnosis present

## 2018-02-14 DIAGNOSIS — Z95828 Presence of other vascular implants and grafts: Secondary | ICD-10-CM

## 2018-02-14 DIAGNOSIS — Z90710 Acquired absence of both cervix and uterus: Secondary | ICD-10-CM | POA: Insufficient documentation

## 2018-02-14 MED ORDER — SODIUM CHLORIDE 0.9 % IJ SOLN
10.0000 mL | INTRAMUSCULAR | Status: DC | PRN
  Administered 2018-02-14: 10 mL via INTRAVENOUS
  Filled 2018-02-14: qty 10

## 2018-02-14 MED ORDER — HEPARIN SOD (PORK) LOCK FLUSH 100 UNIT/ML IV SOLN
500.0000 [IU] | Freq: Once | INTRAVENOUS | Status: AC | PRN
  Administered 2018-02-14: 500 [IU] via INTRAVENOUS
  Filled 2018-02-14: qty 5

## 2018-02-27 ENCOUNTER — Other Ambulatory Visit: Payer: Self-pay | Admitting: Cardiology

## 2018-02-27 NOTE — Telephone Encounter (Signed)
Rx request sent to pharmacy.  

## 2018-03-17 IMAGING — DX DG KNEE 1-2V PORT*L*
2 series · 2 of 2 positions shown · non-contrast
Comparison: 01/03/2015

CLINICAL DATA: Total left knee arthroplasty revision.

EXAM:
PORTABLE LEFT KNEE - 1-2 VIEW

[knee ap]
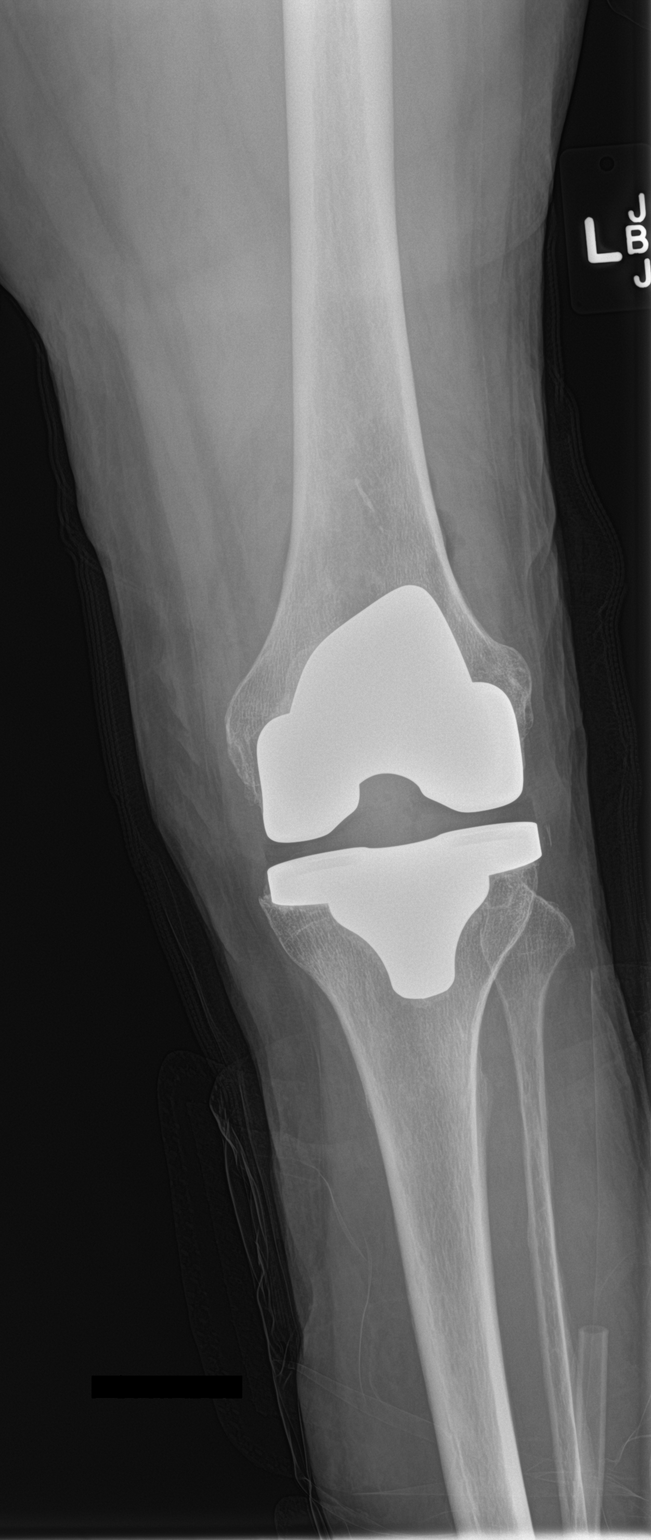

[knee lat]
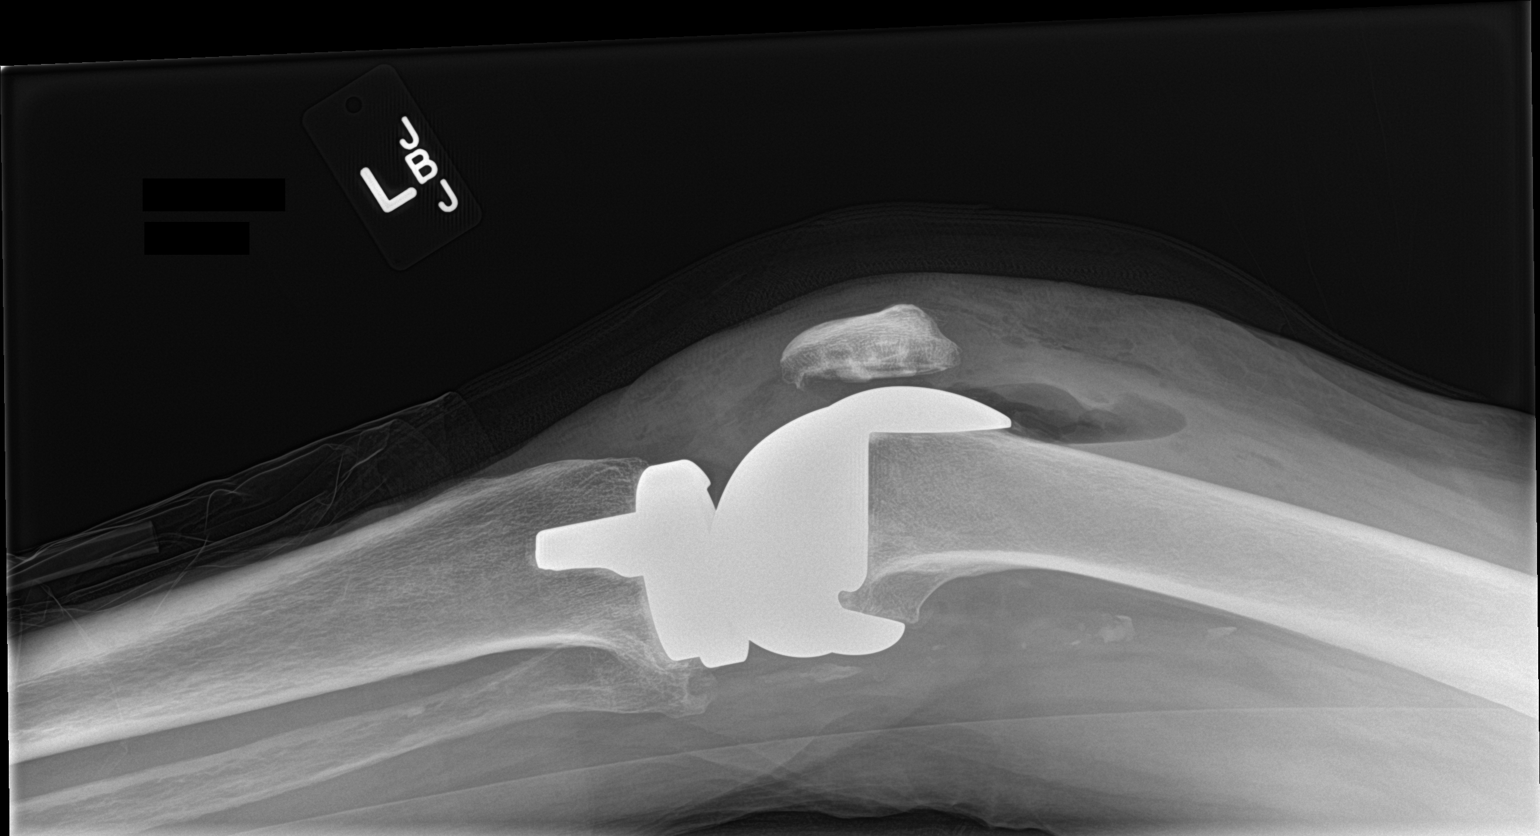

[2 of 2 positions shown; findings below may reference images not displayed]

FINDINGS: Well seated components of a total knee arthroplasty. No complicating
features are demonstrated.
IMPRESSION: Revision of a total knee arthroplasty with well seated components.

## 2018-03-21 ENCOUNTER — Inpatient Hospital Stay: Payer: Medicare Other

## 2018-03-21 ENCOUNTER — Inpatient Hospital Stay: Payer: Medicare Other | Attending: Gynecology

## 2018-03-21 DIAGNOSIS — C569 Malignant neoplasm of unspecified ovary: Secondary | ICD-10-CM | POA: Diagnosis present

## 2018-03-21 DIAGNOSIS — Z79810 Long term (current) use of selective estrogen receptor modulators (SERMs): Secondary | ICD-10-CM | POA: Insufficient documentation

## 2018-03-21 DIAGNOSIS — Z90722 Acquired absence of ovaries, bilateral: Secondary | ICD-10-CM | POA: Insufficient documentation

## 2018-03-21 DIAGNOSIS — Z9221 Personal history of antineoplastic chemotherapy: Secondary | ICD-10-CM | POA: Diagnosis not present

## 2018-03-21 DIAGNOSIS — C7989 Secondary malignant neoplasm of other specified sites: Secondary | ICD-10-CM

## 2018-03-21 DIAGNOSIS — Z90710 Acquired absence of both cervix and uterus: Secondary | ICD-10-CM | POA: Insufficient documentation

## 2018-03-21 DIAGNOSIS — Z95828 Presence of other vascular implants and grafts: Secondary | ICD-10-CM

## 2018-03-21 MED ORDER — HEPARIN SOD (PORK) LOCK FLUSH 100 UNIT/ML IV SOLN
500.0000 [IU] | Freq: Once | INTRAVENOUS | Status: AC | PRN
  Administered 2018-03-21: 500 [IU] via INTRAVENOUS
  Filled 2018-03-21: qty 5

## 2018-03-21 MED ORDER — SODIUM CHLORIDE 0.9 % IJ SOLN
10.0000 mL | INTRAMUSCULAR | Status: DC | PRN
  Administered 2018-03-21: 10 mL via INTRAVENOUS
  Filled 2018-03-21: qty 10

## 2018-03-22 LAB — CA 125: Cancer Antigen (CA) 125: 101.3 U/mL — ABNORMAL HIGH (ref 0.0–38.1)

## 2018-03-23 ENCOUNTER — Other Ambulatory Visit: Payer: Self-pay | Admitting: Internal Medicine

## 2018-03-23 DIAGNOSIS — Z1231 Encounter for screening mammogram for malignant neoplasm of breast: Secondary | ICD-10-CM

## 2018-03-24 ENCOUNTER — Encounter: Payer: Self-pay | Admitting: Gynecology

## 2018-03-24 ENCOUNTER — Inpatient Hospital Stay: Payer: Medicare Other

## 2018-03-24 ENCOUNTER — Inpatient Hospital Stay: Payer: Medicare Other | Admitting: Gynecology

## 2018-03-24 VITALS — BP 114/63 | HR 69 | Temp 97.6°F | Resp 20 | Ht 63.0 in | Wt 131.6 lb

## 2018-03-24 DIAGNOSIS — Z90710 Acquired absence of both cervix and uterus: Secondary | ICD-10-CM | POA: Diagnosis not present

## 2018-03-24 DIAGNOSIS — C569 Malignant neoplasm of unspecified ovary: Secondary | ICD-10-CM | POA: Diagnosis not present

## 2018-03-24 DIAGNOSIS — Z90722 Acquired absence of ovaries, bilateral: Secondary | ICD-10-CM

## 2018-03-24 DIAGNOSIS — Z9221 Personal history of antineoplastic chemotherapy: Secondary | ICD-10-CM | POA: Diagnosis not present

## 2018-03-24 DIAGNOSIS — Z79810 Long term (current) use of selective estrogen receptor modulators (SERMs): Secondary | ICD-10-CM | POA: Diagnosis not present

## 2018-03-24 LAB — BASIC METABOLIC PANEL
ANION GAP: 7 (ref 3–11)
BUN: 14 mg/dL (ref 7–26)
CALCIUM: 9.3 mg/dL (ref 8.4–10.4)
CO2: 27 mmol/L (ref 22–29)
Chloride: 104 mmol/L (ref 98–109)
Creatinine, Ser: 0.77 mg/dL (ref 0.60–1.10)
Glucose, Bld: 93 mg/dL (ref 70–140)
Potassium: 4.2 mmol/L (ref 3.5–5.1)
Sodium: 138 mmol/L (ref 136–145)

## 2018-03-24 MED ORDER — LIDOCAINE-PRILOCAINE 2.5-2.5 % EX CREA: TOPICAL_CREAM | CUTANEOUS | 1 refills | Status: DC

## 2018-03-24 NOTE — Patient Instructions (Signed)
Plan to have a CT scan of the abdomen and pelvis. We will contact you with the results.  We will also arrange for you to meet with Dr. Heath Lark, Medical Oncologist, to discuss chemotherapy.  Recommendation is to start with single agent carboplatin.

## 2018-03-24 NOTE — Progress Notes (Signed)
Consult Note: Gyn-Onc   [name] 77 y.o. female  Chief Complaint  Patient presents with  . 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  I again reviewed with the patient and her husband options given that is clear tamoxifen is no longer working.  Since she has been off carboplatin and Taxol for a significant period of time, and because she was responding when we last used that combination, I would recommend revisiting those drugs.  After considerable conversation regarding the use of single agent carboplatin versus weekly Taxol (both of which I think she would tolerate well) we have agreed to reinitiate therapy using carboplatin every 3 weeks.  We will obtain a CT scan as a new baseline and arrange for the patient be seen by Dr. Alvy Bimler to initiate salvage therapy with carboplatin.  I would consider starting carboplatin at an AUC of 5 but if she tolerates it increase to AUC of 6.  Patient and her husband are warned that the more use of carboplatin the more likely that she will develop a hypersensitivity reaction.  Other side effects were also discussed.  Other treatment options we might consider in the future would include returning to cytotoxic chemotherapy (Doxil, gemcitabine, Topotecan, etoposide, Taxotere, carboplatin, and Taxol).  PARP inhibitors were also discussed (the patient is BRCA negative).   Interval History:   The patient returns today as previously scheduled.   She has been taking tamoxifen for the past 4 months and tolerating it very well except for slight vaginal discharge and continued hot flushes (which she had previously).  Ca1 25 this week was 101 units/mL (previously 84 and 76 units/m).  The patient denies any GI GU or pelvic symptoms.  Her functional status is good.Marland Kitchen  HPI:The patient initially  presented with a pelvic mass and elevated CA 125 (178 units per mL) She underwent exploratory laparotomy and debulking on 02/13/2013. Final pathology showed a poorly differentiated ovarian cancer involving both ovaries pelvic peritoneum and rectal muscularis. All gross disease was resected.  She then received 6 cycles of carboplatin and Taxol chemotherapy completed in September 2014. At the completion of chemotherapy her CA 125 was 18 units per mL.   In May 2016 the patient CA-125 began to rise and a PET CT scan showed metastatic disease in the spleen and pelvic lymph nodes. Given the long platinum free interval , we retreated with carboplatin, Taxol, and  Avastin. She received 6 cycles of carboplatin and Taxol and  Avastin the last being administered in September 2016. She had a significant response with CA 125 falling to 12 units per mL in September. CT scan showed persistent disease in the spleen (which was actually responding). Patient received a total 8 cycles and on follow-up in December 2016 had persistent disease in the spleen which had slightly decreased in size. At that time CA-125 is 14 units per mL.  In March 2017 we switch the patient to letrozole.   A CT scan obtained 06/23/2016 showed a response with the spleen met measure 1.5 x 1.5 cm (previously 2.4 x 3.9 cm). Because of side effects resolve was discontinued in November 2017. A follow-up CT scan on 12/01/2016 showed no evidence of the splenic lesion and her CA-125 at that time was 14 units per mL.  In May 2018 CA-125 rose to 24 units per mL and a  follow-up CT scan showed new lesions in the gastrosplenic ligament ( 2.5 cm) and a lesion in the left pelvis measuring 2.4 cm. Because she had previously responded to Letrozole ( although she did not tolerate let letrozole very well) we began Aromasin 25 mg daily.  December 2018 we switched to tamoxifen 20 mg a day.  After 2 months she seemed to have a relatively stable Ca1 25 but after 4 months  Ca1 25 it risen to 101 units/mL.Marland Kitchen    Review of Systems:10 point review of systems is negative except as noted in interval history.   Vitals: Blood pressure 114/63, pulse 69, temperature 97.6 F (36.4 C), temperature source Oral, resp. rate 20, height 5' 3"  (1.6 m), weight 131 lb 9.6 oz (59.7 kg), SpO2 100 %.  Physical Exam: General : The patient is a healthy woman in no acute distress.  HEENT: normocephalic, extraoccular movements normal; neck is supple without thyromegally , Port-A-Cath appears healthy is no evidence of infection in the right chest wall. Lynphnodes: Supraclavicular and inguinal nodes not enlarged  Abdomen: Soft, non-tender, no ascites, no organomegally, no masses, no hernias , midline incision is healing well  Pelvic:    EGBUS: Normal female  Vagina bladder urethra: Normal  Vaginal cuff is well healed.  Cervix and uterus are surgically absent  Bimanual exam: No masses nodularity or fullness.  Rectovaginal exam confirms      Lower extremities: No edema or varicosities. Normal range of motion      Allergies  Allergen Reactions  . Clonidine Derivatives Shortness Of Breath  . Coreg [Carvedilol] Shortness Of Breath  . Caffeine Other (See Comments)    Makes heart race  . Codeine Other (See Comments)    Does not like the feeling she gets  . Flexeril [Cyclobenzaprine] Other (See Comments)    Pt states "increased heart rate"  . Lipitor [Atorvastatin] Dermatitis    "feels like bugs are biting her"   . Lisinopril     LIP NUMBNESS  . Pravastatin Other (See Comments)    FEELS LIKE "BUG BITES" BITING HER LEGS   . Tape Rash    TEGADERM.   (use opsite on PAC)  . Tegaderm Ag Mesh [Silver] Rash and Other (See Comments)    Burns skin  . Ultram [Tramadol] Palpitations    Past Medical History:  Diagnosis Date  . Arthritis    OSTEOARTHRITIS   -- CONSTANT PAIN RIGHT HIP---AND PAIN LEFT KNEE--PT STATES SHE GETS INJECTIONS INTO HER KNEE  . Complication of  anesthesia    BLOOD PRESSURE DROPPED WITH NASAL SURGERY, ONE OF THE CARPAL TUNNEL REPAIRS AND DURING A COLONOSCOPY  . Dyslipidemia   . History of skin cancer   . Hypertension   . Hypothyroidism   . Menopausal symptoms   . NSTEMI (non-ST elevated myocardial infarction) ([place]) 12/09/15   Medical management: Distal branch of D1 95%, ostial D2 75%, distal LAD 50%. Tortuous arteries consistent with hypertension  . Ovarian cancer ([place]) 01/2013   Recurrence since 2014/2016  . PAF (paroxysmal atrial fibrillation) ([place])    On ELIQUIS; On Amiodarone (Eye Exam 12/25/15) - no longer on Rythmol  . Stroke [facility]) 2017   no deficits    Past Surgical History:  Procedure Laterality Date  . BILATERAL CARPAL TUNNEL REPAIR  2007  . BREAST BIOPSY Left 03/19/2014   benign  . CARDIAC CATHETERIZATION N/A 12/05/2015   Procedure: Left Heart Cath and Coronary Angiography;  Surgeon: Belva Crome, MD;  Location: [facility];  Service: Cardiovascular;  distal branch of D1 95%, ostial D2 75%, dLAD 50%,p-mRCA 40%. Tortuous vessels.  Marland Kitchen DILATION AND CURETTAGE OF UTERUS  1969  . JOINT REPLACEMENT    . LAPAROTOMY Bilateral 02/13/2013   Procedure: EXPLORATORY LAPAROTOMY TOTAL ABDOMINAL HYSTERECTOMY BILATERAL SALPINGO-OOPHORECTOMY, Partial Rectal Resection with Reanastamosis;  Surgeon: Alvino Chapel, MD;  Location: [facility];  Service: Gynecology;  Laterality: Bilateral;  . LEFT KNEE ARTHROSCOPY   2011  . LYMPHADENECTOMY Right 02/13/2013   Procedure: PEVLIC  LYMPHADENECTOMY, DEBULKING right pelvic tumor nodules;  Surgeon: Alvino Chapel, MD;  Location: [facility];  Service: Gynecology;  Laterality: Right;  . NM MYOVIEW LTD  June 2010    subbmaximal with no ischemia or infarction.  . OMENTECTOMY  02/13/2013   Procedure: OMENTECTOMY;  Surgeon: Alvino Chapel, MD;  Location: [facility];  Service: Gynecology;;  . [place]  . SURGERY FOR RUPTURED OVARIAN CYST  1969  . SYNOVECTOMY  WITH POLY EXCHANGE Left 03/18/2017   Procedure: LEFT KNEE PARTIAL SYNOVECTOMY WITH POLY EXCHANGE;  Surgeon: Mcarthur Rossetti, MD;  Location: [facility];  Service: Orthopedics;  Laterality: Left;  Adductor Block  . TAH/BSO/Tumor debulking with right pelvic LND  01/2013  . TONSILLECTOMY  1962  . TOTAL HIP ARTHROPLASTY  02/25/2012   Procedure: TOTAL HIP ARTHROPLASTY ANTERIOR APPROACH;  Surgeon: Mcarthur Rossetti, MD;  Location: [facility];  Service: Orthopedics;  Laterality: Right;  . TOTAL KNEE ARTHROPLASTY Left 01/03/2015   Procedure: LEFT TOTAL KNEE ARTHROPLASTY;  Surgeon: Mcarthur Rossetti, MD;  Location: [facility];  Service: Orthopedics;  Laterality: Left;  . TRANSTHORACIC ECHOCARDIOGRAM  May 2014; January 2016   a. Normal LV size function. EF 60-65%. Grade 1 diastolic function. Mild MR and mildly elevated PA pressures of 37 mmHg per;; b. EF 60-65%. Mobile echodensity 10 mm x 6 mm attest interventricular septum is questionable fibroblastoma.  Marland Kitchen TRIGGER FINGER RELEASE Left 12/20/2017   Procedure: RELEASE TRIGGER FINGER/A-1 PULLEY LEFT RING FINGER;  Surgeon: Daryll Brod, MD;  Location: [facility];  Service: Orthopedics;  Laterality: Left;    Current Outpatient Medications  Medication Sig Dispense Refill  . acetaminophen (TYLENOL) 500 MG tablet Take 1,000 mg by mouth every 6 (six) hours as needed for mild pain.     Marland Kitchen amiodarone (PACERONE) 200 MG tablet Take 100 mg by mouth daily.  3  . amiodarone (PACERONE) 200 MG tablet TAKE 1/2 TABLET BY MOUTH DAILY 45 tablet 3  . amoxicillin (AMOXIL) 500 MG capsule Take 2,000 mg by mouth See admin instructions. Takes 4 capsules 1 hour prior to procedure    . Biotin 10 MG TABS Take 10 mg by mouth daily.     Marland Kitchen desonide (DESOWEN) 0.05 % cream Apply 1 application topically 2 (two) times daily.   1  . diphenhydrAMINE (BENADRYL) 25 MG tablet Take 50 mg by mouth at bedtime as needed for sleep.     Marland Kitchen docusate sodium (COLACE) 100 MG capsule Take 250 mg  by mouth 2 (two) times daily.     Marland Kitchen ELIQUIS 5 MG TABS tablet TAKE 1 TABLET (5 MG TOTAL) BY MOUTH 2 (TWO) TIMES DAILY. 180 tablet 1  . Evolocumab with Infusor 420 MG/3.5ML SOCT Inject into the skin.    Marland Kitchen ezetimibe (ZETIA) 10 MG tablet Take 1 tablet (10 mg total) by mouth daily. (Patient taking differently: Take 10 mg by mouth at bedtime. ) 30 tablet 6  . Glucosamine HCl 1000 MG  TABS Take 1,000 mg by mouth daily.    . hydrocortisone 1 % ointment Apply 1 application topically 2 (two) times daily as needed for itching (eczema).     Marland Kitchen ipratropium (ATROVENT) 0.06 % nasal spray Place 2 sprays into both nostrils daily.   5  . irbesartan (AVAPRO) 300 MG tablet TAKE 1 TABLET (300 MG TOTAL) BY MOUTH DAILY. 90 tablet 1  . levothyroxine (SYNTHROID) 88 MCG tablet Take 88 mcg by mouth daily before breakfast.    . lidocaine-prilocaine (EMLA) cream Apply to Porta-Cath site 1-2 hours prior to access as directed. (Patient taking differently: Apply 1 application topically See admin instructions. Apply to [facility] site 1-2 hours prior to access as directed.) 30 g 2  . magnesium hydroxide (MILK OF MAGNESIA) 800 MG/5ML suspension Take 30 mLs by mouth daily as needed for constipation. Reported on 11/20/2015    . Melatonin 5 MG CAPS Take 5 mg by mouth at bedtime as needed (sleep). Reported on 12/25/2015    . Menthol, Topical Analgesic, (ICY HOT EX) Apply 1 application topically 2 (two) times daily as needed (PAIN).     . Multiple Vitamin (MULTIVITAMIN WITH MINERALS) TABS tablet Take 1 tablet by mouth daily. Centrum Silver    . OVER THE COUNTER MEDICATION Co210 20 mg once a day for muscle aches.    Vladimir Faster Glycol-Propyl Glycol (SYSTANE OP) Place 1 drop into both eyes 2 (two) times daily.     . polyethylene glycol (MIRALAX / GLYCOLAX) packet Take 17 g by mouth daily. Mix in 8 oz liquid and drink    . potassium chloride (K-DUR) 10 MEQ tablet Take 10 mEq by mouth every Monday, Wednesday, and Friday.    Marland Kitchen SHINGRIX  injection     . spironolactone (ALDACTONE) 25 MG tablet Take 25 mg by mouth See admin instructions. Takes on Tuesdays, Thursday, Saturday, Sunday    . tamoxifen (NOLVADEX) 20 MG tablet Take 1 tablet (20 mg total) by mouth daily. 30 tablet 6   No current facility-administered medications for this visit.    Facility-Administered Medications Ordered in Other Visits  Medication Dose Route Frequency Provider Last Rate Last Dose  . sodium chloride flush (NS) 0.9 % injection 10 mL  10 mL Intravenous PRN Joylene John D, NP   10 mL at 01/06/18 1220    Social History   Socioeconomic History  . Marital status: Married    Spouse name: Not on file  . Number of children: Not on file  . Years of education: Not on file  . Highest education level: Not on file  Occupational History  . Not on file  Social Needs  . Financial resource strain: Not on file  . Food insecurity:    Worry: Not on file    Inability: Not on file  . Transportation needs:    Medical: Not on file    Non-medical: Not on file  Tobacco Use  . Smoking status: Never Smoker  . Smokeless tobacco: Never Used  Substance and Sexual Activity  . Alcohol use: No    Alcohol/week: 0.0 oz  . Drug use: No  . Sexual activity: Not Currently  Lifestyle  . Physical activity:    Days per week: Not on file    Minutes per session: Not on file  . Stress: Not on file  Relationships  . Social connections:    Talks on phone: Not on file    Gets together: Not on file    Attends religious service: Not  on file    Active member of club or organization: Not on file    Attends meetings of clubs or organizations: Not on file    Relationship status: Not on file  . Intimate partner violence:    Fear of current or ex partner: Not on file    Emotionally abused: Not on file    Physically abused: Not on file    Forced sexual activity: Not on file  Other Topics Concern  . Not on file  Social History Narrative   Married mother of 3, grandmother 31.    Exercises 6/7 days a week walking 30 minutes a time.   Never smoked or drank alcohol.    Family History  Problem Relation Age of Onset  . Hypertension Mother   . Basal cell carcinoma Mother   . AAA (abdominal aortic aneurysm) Father   . Heart Problems Maternal Uncle   . Heart Problems Maternal Grandfather   . AAA (abdominal aortic aneurysm) Paternal Grandmother   . Prostate cancer Maternal Uncle 70  . Prostate cancer Other   . Other Daughter        one daughter had TAH-BSO at 67; other daughter will have one soon      Marti Sleigh, MD 03/24/2018, 2:30 PM

## 2018-03-28 ENCOUNTER — Telehealth: Payer: Self-pay | Admitting: *Deleted

## 2018-03-28 NOTE — Telephone Encounter (Signed)
Patient called and was given the appt to see Dr. Alvy Bimler on May 6th at 2pm

## 2018-03-29 ENCOUNTER — Encounter: Payer: Self-pay | Admitting: Hematology and Oncology

## 2018-03-30 ENCOUNTER — Ambulatory Visit (HOSPITAL_COMMUNITY)
Admission: RE | Admit: 2018-03-30 | Discharge: 2018-03-30 | Disposition: A | Discharge status: Home / Self Care | Payer: Medicare Other | Source: Ambulatory Visit | Attending: Gynecologic Oncology | Admitting: Gynecologic Oncology

## 2018-03-30 DIAGNOSIS — C786 Secondary malignant neoplasm of retroperitoneum and peritoneum: Secondary | ICD-10-CM | POA: Insufficient documentation

## 2018-03-30 DIAGNOSIS — C569 Malignant neoplasm of unspecified ovary: Secondary | ICD-10-CM | POA: Insufficient documentation

## 2018-03-30 MED ORDER — IOHEXOL 300 MG/ML  SOLN
100.0000 mL | Freq: Once | INTRAMUSCULAR | Status: AC | PRN
  Administered 2018-03-30: 100 mL via INTRAVENOUS

## 2018-03-31 ENCOUNTER — Telehealth: Payer: Self-pay

## 2018-03-31 NOTE — Telephone Encounter (Signed)
Told Mr Cristiano that the CT shows slight increase in the size of cancer in the abdomen. No new lesions per Melissa Cross,NP. There is also a lot of stool seen in the the bowel.  Pt with chronic constipation. Mr Seward will relay this information to Ms Lindenbaum. Pt to keep appointment with Dr. Alvy Bimler as scheduled.

## 2018-04-03 ENCOUNTER — Encounter: Payer: Self-pay | Admitting: Oncology

## 2018-04-03 ENCOUNTER — Inpatient Hospital Stay: Payer: Medicare Other | Attending: Gynecology | Admitting: Hematology and Oncology

## 2018-04-03 ENCOUNTER — Telehealth: Payer: Self-pay | Admitting: Hematology and Oncology

## 2018-04-03 ENCOUNTER — Inpatient Hospital Stay: Payer: Medicare Other

## 2018-04-03 ENCOUNTER — Encounter: Payer: Self-pay | Admitting: Hematology and Oncology

## 2018-04-03 ENCOUNTER — Encounter: Payer: Self-pay | Admitting: *Deleted

## 2018-04-03 ENCOUNTER — Other Ambulatory Visit: Payer: Self-pay | Admitting: Hematology and Oncology

## 2018-04-03 VITALS — BP 127/70 | HR 75 | Temp 99.4°F | Resp 18 | Ht 63.0 in | Wt 131.0 lb

## 2018-04-03 DIAGNOSIS — Z79810 Long term (current) use of selective estrogen receptor modulators (SERMs): Secondary | ICD-10-CM | POA: Diagnosis not present

## 2018-04-03 DIAGNOSIS — K5909 Other constipation: Secondary | ICD-10-CM

## 2018-04-03 DIAGNOSIS — Z5111 Encounter for antineoplastic chemotherapy: Secondary | ICD-10-CM | POA: Insufficient documentation

## 2018-04-03 DIAGNOSIS — Z7189 Other specified counseling: Secondary | ICD-10-CM

## 2018-04-03 DIAGNOSIS — C7889 Secondary malignant neoplasm of other digestive organs: Secondary | ICD-10-CM

## 2018-04-03 DIAGNOSIS — G620 Drug-induced polyneuropathy: Secondary | ICD-10-CM

## 2018-04-03 DIAGNOSIS — Z9221 Personal history of antineoplastic chemotherapy: Secondary | ICD-10-CM

## 2018-04-03 DIAGNOSIS — C7989 Secondary malignant neoplasm of other specified sites: Secondary | ICD-10-CM

## 2018-04-03 DIAGNOSIS — Z79899 Other long term (current) drug therapy: Secondary | ICD-10-CM | POA: Diagnosis not present

## 2018-04-03 DIAGNOSIS — Z90710 Acquired absence of both cervix and uterus: Secondary | ICD-10-CM

## 2018-04-03 DIAGNOSIS — C569 Malignant neoplasm of unspecified ovary: Secondary | ICD-10-CM | POA: Diagnosis not present

## 2018-04-03 DIAGNOSIS — Z006 Encounter for examination for normal comparison and control in clinical research program: Secondary | ICD-10-CM

## 2018-04-03 DIAGNOSIS — C561 Malignant neoplasm of right ovary: Secondary | ICD-10-CM

## 2018-04-03 DIAGNOSIS — Z90722 Acquired absence of ovaries, bilateral: Secondary | ICD-10-CM | POA: Diagnosis not present

## 2018-04-03 DIAGNOSIS — T451X5A Adverse effect of antineoplastic and immunosuppressive drugs, initial encounter: Secondary | ICD-10-CM

## 2018-04-03 LAB — CMP (CANCER CENTER ONLY)
ALT: 12 U/L (ref 0–55)
AST: 23 U/L (ref 5–34)
Albumin: 4 g/dL (ref 3.5–5.0)
Alkaline Phosphatase: 63 U/L (ref 40–150)
Anion gap: 4 (ref 3–11)
BILIRUBIN TOTAL: 0.4 mg/dL (ref 0.2–1.2)
BUN: 17 mg/dL (ref 7–26)
CO2: 29 mmol/L (ref 22–29)
CREATININE: 0.85 mg/dL (ref 0.60–1.10)
Calcium: 9.8 mg/dL (ref 8.4–10.4)
Chloride: 104 mmol/L (ref 98–109)
GFR, Est AFR Am: >60 mL/min (ref >60)
Glucose, Bld: 108 mg/dL (ref 70–140)
POTASSIUM: 4.4 mmol/L (ref 3.5–5.1)
Sodium: 137 mmol/L (ref 136–145)
TOTAL PROTEIN: 6.6 g/dL (ref 6.4–8.3)

## 2018-04-03 LAB — CBC WITH DIFFERENTIAL (CANCER CENTER ONLY)
BASOS PCT: 1 %
Basophils Absolute: 0 10*3/uL (ref 0.0–0.1)
EOS ABS: 0 10*3/uL (ref 0.0–0.5)
EOS PCT: 1 %
HCT: 35.5 % (ref 34.8–46.6)
Hemoglobin: 11.9 g/dL (ref 11.6–15.9)
LYMPHS PCT: 29 %
Lymphs Abs: 1.7 10*3/uL (ref 0.9–3.3)
MCH: 33.5 pg (ref 25.1–34.0)
MCHC: 33.5 g/dL (ref 31.5–36.0)
MCV: 100 fL (ref 79.5–101.0)
MONO ABS: 0.6 10*3/uL (ref 0.1–0.9)
MONOS PCT: 11 %
Neutro Abs: 3.6 10*3/uL (ref 1.5–6.5)
Neutrophils Relative %: 58 %
PLATELETS: 155 10*3/uL (ref 145–400)
RBC: 3.55 MIL/uL — ABNORMAL LOW (ref 3.70–5.45)
RDW: 12.6 % (ref 11.2–14.5)
WBC: 6.1 10*3/uL (ref 3.9–10.3)

## 2018-04-03 LAB — RESEARCH LABS

## 2018-04-03 MED ORDER — PROCHLORPERAZINE MALEATE 10 MG PO TABS: 10.0000 mg | ORAL_TABLET | Freq: Four times a day (QID) | ORAL | 1 refills | Status: DC | PRN

## 2018-04-03 MED ORDER — ONDANSETRON HCL 8 MG PO TABS: 8.0000 mg | ORAL_TABLET | Freq: Two times a day (BID) | ORAL | 1 refills | Status: DC | PRN

## 2018-04-03 NOTE — Telephone Encounter (Signed)
Gave patient AVs and calendar of upcoming may and June appointments.

## 2018-04-03 NOTE — Progress Notes (Signed)
[facility] FOLLOW-UP progress notes  Patient Care Team: Shon Baton, MD as PCP - General (Internal Medicine)  CHIEF COMPLAINTS/PURPOSE OF VISIT:  Recurrent ovarian cancer  HISTORY OF PRESENTING ILLNESS:  [name] 77 y.o. female was transferred to my care after her prior physician has left.  Her husband, Levada Dy is present The patient is a retired Marine scientist.  She has 3 adult children I reviewed the patient's records extensive and collaborated the history with the patient. 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  Surgical consultation is advised.  No findings to suggest metastatic disease in the abdomen/pelvis      02/13/2013 Pathology Results    1. Adnexa - ovary +/- tube, neoplastic - HIGH GRADE OVARIAN POORLY DIFFERENTIATED CARCINOMA WITH ASSOCIATED TUMOR NECROSIS, 11.5 CM, INVOLVING THE OVARIAN CAPSULE AND ADJACENT FALLOPIAN TUBE. - PLEASE SEE ONCOLOGY TEMPLATE FOR DETAILS. 2. Ovary and fallopian tube, left - HIGH GRADE OVARIAN POORLY DIFFERENTIATED CARCINOMA WITH ASSOCIATED TUMOR NECROSIS, 2.3 CM, INVOLVING THE OVARIAN CAPSULE. - FALLOPIAN TUBAL TISSUE, NO EVIDENCE OF MALIGNANCY. - PLEASE SEE ONCOLOGY TEMPLATE FOR DETAILS. 3. Uterus and cervix - ENDOMETRIUM: BENIGN WEAKLY PROLIFERATIVE ENDOMETRIUM, NO ATYPIA, HYPERPLASIA OR MALIGNANCY. - CERVIX: BENIGN SQUAMOUS MUCOSA AND ENDOCERVICAL MUCOSA, NO DYSPLASIA OR MALIGNANCY. - UTERINE SEROSA: ADHESIONS, NO EVIDENCE OF ENDOMETRIOSIS, ATYPIA OR  MALIGNANCY. 4. Cul-de-sac biopsy - POSITIVE FOR METASTATIC CARCINOMA (2.0 CM). 5. Soft tissue mass, simple excision, peri rectal - POSITIVE FOR METASTATIC CARCINOMA (2.6 CM). 6. Lymph nodes, regional resection, right pelvic - SEVEN LYMPH NODES, NEGATIVE FOR METASTATIC CARCINOMA (0/7). 7. Omentum, resection for tumor - MATURE ADIPOSE TISSUE, NO EVIDENCE OF MALIGNANCY. Microscopic Comment 1. OVARY Specimen(s): Right and left ovaries and fallopian tubes, uterus and cervix Procedure(s): Total hysterectomy, bilateral salpingo-oophorectomy Lymph node sampling performed: Yes Primary tumor site: Right and left ovaries Ovarian surface involvement: Present Maximum tumor size (cm): Right ovary: 11.5 cm; left ovary: 2.3 cm, gross measurement. 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 please see comment. Grade: High grade Peritoneal implants: No Pelvic extension: Cul-de-sac biopsy and perirectal soft tissue biopsy: positive Peritoneal washings: Rare cluster of atypical cells present ([id number]) Lymph nodes: number examined 7 ; number positive 0 TNM code: pT2c, pN0 FIGO Stage (based on pathologic findings, needs clinical correlation): II B (2014 edition) Comments: Sections of bilateral ovaries show sheets and nests of malignant epithelium resembling transitional cell carcinoma with associated tumor necrosis. No evidence of Brenner's tumor or conventional endometrioid carcinoma is identified in the background. The bilateral ovarian capsules are involved. Immunohistochemical stains were performed and the malignant cells are strongly positive for cytokeratin 7, WT-1, p16 and p53, negative for cytokeratin 20. 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 The tumor involves the right fallopian tube. Both cul-de-sac and perirectal soft tissue  biopsies are positive for metastatic carcinoma. The omentum biopsy is negative for  tumor.       03/20/2013 - 08/14/2013 Chemotherapy    The patient had 6 cycles of carboplatin and taxol      09/25/2013 Imaging    1. Interval resection of previously described right pelvic mass. No evidence of recurrent or metastatic disease. 2. Small hiatal hernia. 3. Moderate amount of stool within the rectum. Question constipation or even mild fecal impaction.      04/03/2015 PET scan    1. Highly hypermetabolic height bowed dense mass in the medial spleen compatible with malignancy. 2. Bilateral pelvic sidewall adenopathy, right greater than left, hypermetabolic, with small peritoneal implants of tumor in the lower pelvis, and with several faint but suspected omental implants of tumor. There is also a hypermetabolic aortocaval lymph node in the retroperitoneum. 3. Generalized increased thyroid activity could be due to thyroiditis. Although a well-defined nodule is not seen in the right thyroid lobe, there is focal increased hypermetabolic activity in the right thyroid lobe. Thyroid ultrasound is recommended for further investigation. 4. I suspect that the high activity between the spinous processes of L3 and L4 is degenerative.      05/01/2015 - 10/21/2015 Chemotherapy    The patient had 8 cycles of carboplatin, taxol and Avastin.        07/02/2015 Imaging    1. Decrease in size of these splenic lesion. Suspect tumor also involving the adjacent posterior wall of the stomach. 2. Improved pelvic lymphadenopathy. 3. No new omental or peritoneal surface disease.       09/10/2015 Imaging    1. Response to therapy, with further decreased size of a medial splenic lesion. The previously questioned extension into the stomach is no longer identified and may have been artifactual on the prior exam. Of note, an inferior splenic low-density capsular based lesion is felt to be similar and warrants followup  attention. 2. No adenopathy or new peritoneal disease identified. 3.  Possible constipation. 4.  Advanced aortic and branch vessel atherosclerosis.      11/06/2015 Imaging    1. Mass arising from the medial spleen and involving the posterior wall of stomach demonstrates mild decrease in size in the interval. 2. Interval improvement and previously referenced pathologic iliac lymph nodes. 3. Previously referenced peritoneal implants within the pelvis are no longer measurable. No new areas of peritoneal metastasis is identified. 4. There is a right hilar node which is borderline enlarged but is increased in size when compared with 04/19/2013. Attention on follow-up examination is recommended.      11/18/2015 PET scan    1. Residual hypermetabolic tumor implant between the stomach and spleen, significantly decreased in size and mildly decreased in metabolism compared to 04/03/2015 PET-CT, and slightly decreased in size since 11/06/2015. 2. Residual hypermetabolic right deep pelvic side wall lymph node, with partial treatment response compared to 04/03/2015 PET-CT. 3. No new sites of hypermetabolic metastatic disease. 4. Stable diffuse thyroid hypermetabolism without discrete thyroid nodule, most suggestive of diffuse thyroiditis. Recommend correlation with serum thyroid function tests.       01/28/2016 - 11/26/2016 Anti-estrogen oral therapy    She was placed on Letrozole, stopped due to poor tolerance      02/09/2016 Imaging    1. Continued decrease in size of peritoneal lesion between the posterior wall of stomach and spleen. 2. No new or progressive disease identified      06/23/2016 Imaging    1. Near complete resolution of subtle residual soft tissue density lesion in the gastrosplenic ligament. 2. No  new sites of disease identified. 3. Stool burden suggests constipation or even fecal impaction      12/01/2016 Imaging    1. No CT findings for residual or recurrent abdominal/pelvic  ovarian cancer. 2. No acute abdominal/pelvic findings. 3. Large amount of stool throughout the colon and down into the rectum suggesting constipation. 4. Stable atherosclerotic calcifications involving the aorta and iliac arteries.      03/29/2017 - 10/29/2017 Anti-estrogen oral therapy    She was on Aromasin      04/19/2017 Imaging    1. Suspicion of peritoneal recurrence with enlarging implants in the gastrosplenic ligament, left adnexa and posterior to the cecum. No generalized ascites, adenopathy or definite solid organ disease. 2. No evidence of bowel or ureteral obstruction. 3.  Aortic Atherosclerosis (ICD10-I70.0).      06/25/2017 Genetic Testing    Patient has genetic testing done for Breast/ovarian cancer panel Results revealed patient has no detectable mutation      09/01/2017 Tumor Marker    Patient's tumor was tested for the following markers: CA-125 Results of the tumor marker test revealed 40.9      10/29/2017 - 03/24/2018 Anti-estrogen oral therapy    She was on tamoxifen      11/05/2017 Tumor Marker    Patient's tumor was tested for the following markers: CA-125 Results of the tumor marker test revealed 84.4      11/15/2017 Imaging    1. Mild progression of peritoneal metastasis. 2. Possible constipation. No obstruction or other acute complication. 3.  Aortic Atherosclerosis (ICD10-I70.0).      03/21/2018 Tumor Marker    Patient's tumor was tested for the following markers: CA-125 Results of the tumor marker test revealed 101.3.      Currently, she is not symptomatic from her disease She has chronic constipation, stable with laxatives She denies nausea or significant abdominal bloating She has very mild grade 1 neuropathy affecting the toes causing numbness but it does not bother her She denies recent infection, fever or chills The patient denies any recent signs or symptoms of bleeding such as spontaneous epistaxis, hematuria or hematochezia.  MEDICAL  HISTORY:  Past Medical History:  Diagnosis Date  . Arthritis    OSTEOARTHRITIS   -- CONSTANT PAIN RIGHT HIP---AND PAIN LEFT KNEE--PT STATES SHE GETS INJECTIONS INTO HER KNEE  . Complication of anesthesia    BLOOD PRESSURE DROPPED WITH NASAL SURGERY, ONE OF THE CARPAL TUNNEL REPAIRS AND DURING A COLONOSCOPY  . Dyslipidemia   . History of skin cancer   . Hypertension   . Hypothyroidism   . Menopausal symptoms   . NSTEMI (non-ST elevated myocardial infarction) ([place]) 12/09/15   Medical management: Distal branch of D1 95%, ostial D2 75%, distal LAD 50%. Tortuous arteries consistent with hypertension  . Ovarian cancer ([place]) 01/2013   Recurrence since 2014/2016  . PAF (paroxysmal atrial fibrillation) ([place])    On ELIQUIS; On Amiodarone (Eye Exam 12/25/15) - no longer on Rythmol  . Stroke [facility]) 2017   no deficits    SURGICAL HISTORY: Past Surgical History:  Procedure Laterality Date  . BILATERAL CARPAL TUNNEL REPAIR  2007  . BREAST BIOPSY Left 03/19/2014   benign  . CARDIAC CATHETERIZATION N/A 12/05/2015   Procedure: Left Heart Cath and Coronary Angiography;  Surgeon: Belva Crome, MD;  Location: [facility];  Service: Cardiovascular;  distal branch of D1 95%, ostial D2 75%, dLAD 50%,p-mRCA 40%. Tortuous vessels.  Marland Kitchen DILATION AND CURETTAGE OF UTERUS  1969  .  JOINT REPLACEMENT    . LAPAROTOMY Bilateral 02/13/2013   Procedure: EXPLORATORY LAPAROTOMY TOTAL ABDOMINAL HYSTERECTOMY BILATERAL SALPINGO-OOPHORECTOMY, Partial Rectal Resection with Reanastamosis;  Surgeon: Alvino Chapel, MD;  Location: [facility];  Service: Gynecology;  Laterality: Bilateral;  . LEFT KNEE ARTHROSCOPY   2011  . LYMPHADENECTOMY Right 02/13/2013   Procedure: PEVLIC  LYMPHADENECTOMY, DEBULKING right pelvic tumor nodules;  Surgeon: Alvino Chapel, MD;  Location: [facility];  Service: Gynecology;  Laterality: Right;  . NM MYOVIEW LTD  June 2010    subbmaximal with no ischemia or infarction.  . OMENTECTOMY   02/13/2013   Procedure: OMENTECTOMY;  Surgeon: Alvino Chapel, MD;  Location: [facility];  Service: Gynecology;;  . [place]  . SURGERY FOR RUPTURED OVARIAN CYST  1969  . SYNOVECTOMY WITH POLY EXCHANGE Left 03/18/2017   Procedure: LEFT KNEE PARTIAL SYNOVECTOMY WITH POLY EXCHANGE;  Surgeon: Mcarthur Rossetti, MD;  Location: [facility];  Service: Orthopedics;  Laterality: Left;  Adductor Block  . TAH/BSO/Tumor debulking with right pelvic LND  01/2013  . TONSILLECTOMY  1962  . TOTAL HIP ARTHROPLASTY  02/25/2012   Procedure: TOTAL HIP ARTHROPLASTY ANTERIOR APPROACH;  Surgeon: Mcarthur Rossetti, MD;  Location: [facility];  Service: Orthopedics;  Laterality: Right;  . TOTAL KNEE ARTHROPLASTY Left 01/03/2015   Procedure: LEFT TOTAL KNEE ARTHROPLASTY;  Surgeon: Mcarthur Rossetti, MD;  Location: [facility];  Service: Orthopedics;  Laterality: Left;  . TRANSTHORACIC ECHOCARDIOGRAM  May 2014; January 2016   a. Normal LV size function. EF 60-65%. Grade 1 diastolic function. Mild MR and mildly elevated PA pressures of 37 mmHg per;; b. EF 60-65%. Mobile echodensity 10 mm x 6 mm attest interventricular septum is questionable fibroblastoma.  Marland Kitchen TRIGGER FINGER RELEASE Left 12/20/2017   Procedure: RELEASE TRIGGER FINGER/A-1 PULLEY LEFT RING FINGER;  Surgeon: Daryll Brod, MD;  Location: [facility];  Service: Orthopedics;  Laterality: Left;    SOCIAL HISTORY: Social History   Socioeconomic History  . Marital status: Married    Spouse name: Levada Dy  . Number of children: 3  . Years of education: Not on file  . Highest education level: Not on file  Occupational History  . Occupation: retired Animal nutritionist  . Financial resource strain: Not on file  . Food insecurity:    Worry: Not on file    Inability: Not on file  . Transportation needs:    Medical: Not on file    Non-medical: Not on file  Tobacco Use  . Smoking status: Never Smoker  . Smokeless tobacco:  Never Used  Substance and Sexual Activity  . Alcohol use: No    Alcohol/week: 0.0 oz  . Drug use: No  . Sexual activity: Not Currently  Lifestyle  . Physical activity:    Days per week: Not on file    Minutes per session: Not on file  . Stress: Not on file  Relationships  . Social connections:    Talks on phone: Not on file    Gets together: Not on file    Attends religious service: Not on file    Active member of club or organization: Not on file    Attends meetings of clubs or organizations: Not on file    Relationship status: Not on file  . Intimate partner violence:    Fear of current or ex partner: Not on file    Emotionally abused: Not on file    Physically abused: Not  on file    Forced sexual activity: Not on file  Other Topics Concern  . Not on file  Social History Narrative   Married mother of 3, grandmother 46.   Exercises 6/7 days a week walking 30 minutes a time.   Never smoked or drank alcohol.    FAMILY HISTORY: Family History  Problem Relation Age of Onset  . Hypertension Mother   . Basal cell carcinoma Mother   . AAA (abdominal aortic aneurysm) Father   . Heart Problems Maternal Uncle   . Heart Problems Maternal Grandfather   . AAA (abdominal aortic aneurysm) Paternal Grandmother   . Prostate cancer Maternal Uncle 70  . Prostate cancer Other   . Other Daughter        one daughter had TAH-BSO at 59; other daughter will have one soon    ALLERGIES:  is allergic to clonidine derivatives; coreg [carvedilol]; caffeine; codeine; flexeril [cyclobenzaprine]; lipitor [atorvastatin]; lisinopril; pravastatin; tape; tegaderm ag mesh [silver]; and ultram [tramadol].  MEDICATIONS:  Current Outpatient Medications  Medication Sig Dispense Refill  . acetaminophen (TYLENOL) 500 MG tablet Take 1,000 mg by mouth every 6 (six) hours as needed for mild pain.     Marland Kitchen amiodarone (PACERONE) 200 MG tablet Take 100 mg by mouth daily.  3  . amiodarone (PACERONE) 200 MG tablet  TAKE 1/2 TABLET BY MOUTH DAILY 45 tablet 3  . amoxicillin (AMOXIL) 500 MG capsule Take 2,000 mg by mouth See admin instructions. Takes 4 capsules 1 hour prior to procedure    . Biotin 10 MG TABS Take 10 mg by mouth daily.     Marland Kitchen desonide (DESOWEN) 0.05 % cream Apply 1 application topically 2 (two) times daily.   1  . diphenhydrAMINE (BENADRYL) 25 MG tablet Take 50 mg by mouth at bedtime as needed for sleep.     Marland Kitchen docusate sodium (COLACE) 100 MG capsule Take 250 mg by mouth 2 (two) times daily.     Marland Kitchen ELIQUIS 5 MG TABS tablet TAKE 1 TABLET (5 MG TOTAL) BY MOUTH 2 (TWO) TIMES DAILY. 180 tablet 1  . Evolocumab with Infusor 420 MG/3.5ML SOCT Inject into the skin.    Marland Kitchen ezetimibe (ZETIA) 10 MG tablet Take 1 tablet (10 mg total) by mouth daily. (Patient taking differently: Take 10 mg by mouth at bedtime. ) 30 tablet 6  . famotidine (PEPCID) 20 MG tablet Take 20 mg by mouth 2 (two) times daily.    . Glucosamine HCl 1000 MG TABS Take 1,000 mg by mouth daily.    . hydrocortisone 1 % ointment Apply 1 application topically 2 (two) times daily as needed for itching (eczema).     Marland Kitchen ipratropium (ATROVENT) 0.06 % nasal spray Place 2 sprays into both nostrils daily.   5  . irbesartan (AVAPRO) 300 MG tablet TAKE 1 TABLET (300 MG TOTAL) BY MOUTH DAILY. 90 tablet 1  . levothyroxine (SYNTHROID) 88 MCG tablet Take 88 mcg by mouth daily before breakfast.    . lidocaine-prilocaine (EMLA) cream Apply to Porta-Cath site 1-2 hours prior to access as directed. 30 g 1  . magnesium hydroxide (MILK OF MAGNESIA) 800 MG/5ML suspension Take 30 mLs by mouth daily as needed for constipation. Reported on 11/20/2015    . Melatonin 5 MG CAPS Take 5 mg by mouth at bedtime as needed (sleep). Reported on 12/25/2015    . Menthol, Topical Analgesic, (ICY HOT EX) Apply 1 application topically 2 (two) times daily as needed (PAIN).     Marland Kitchen  Multiple Vitamin (MULTIVITAMIN WITH MINERALS) TABS tablet Take 1 tablet by mouth daily. Centrum Silver    .  ondansetron (ZOFRAN) 8 MG tablet Take 1 tablet (8 mg total) by mouth 2 (two) times daily as needed for refractory nausea / vomiting. Start on day 3 after chemo. 30 tablet 1  . OVER THE COUNTER MEDICATION Co210 20 mg once a day for muscle aches.    Vladimir Faster Glycol-Propyl Glycol (SYSTANE OP) Place 1 drop into both eyes 2 (two) times daily.     . polyethylene glycol (MIRALAX / GLYCOLAX) packet Take 17 g by mouth daily. Mix in 8 oz liquid and drink    . potassium chloride (K-DUR) 10 MEQ tablet Take 10 mEq by mouth every Monday, Wednesday, and Friday.    . prochlorperazine (COMPAZINE) 10 MG tablet Take 1 tablet (10 mg total) by mouth every 6 (six) hours as needed (Nausea or vomiting). 30 tablet 1  . SHINGRIX injection     . spironolactone (ALDACTONE) 25 MG tablet Take 25 mg by mouth See admin instructions. Takes on Tuesdays, Thursday, Saturday, Sunday    . tamoxifen (NOLVADEX) 20 MG tablet Take 1 tablet (20 mg total) by mouth daily. 30 tablet 6   No current facility-administered medications for this visit.    Facility-Administered Medications Ordered in Other Visits  Medication Dose Route Frequency Provider Last Rate Last Dose  . sodium chloride flush (NS) 0.9 % injection 10 mL  10 mL Intravenous PRN Elinor Parkinson, Melissa D, NP   10 mL at 01/06/18 1220    REVIEW OF SYSTEMS:   Constitutional: Denies fevers, chills or abnormal night sweats Eyes: Denies blurriness of vision, double vision or watery eyes Ears, nose, mouth, throat, and face: Denies mucositis or sore throat Respiratory: Denies cough, dyspnea or wheezes Cardiovascular: Denies palpitation, chest discomfort or lower extremity swelling Skin: Denies abnormal skin rashes Lymphatics: Denies new lymphadenopathy or easy bruising Neurological:Denies numbness, tingling or new weaknesses Behavioral/Psych: Mood is stable, no new changes  All other systems were reviewed with the patient and are negative.  PHYSICAL EXAMINATION: ECOG PERFORMANCE  STATUS: 1 - Symptomatic but completely ambulatory  Vitals:   04/03/18 1403  BP: 127/70  Pulse: 75  Resp: 18  Temp: 99.4 F (37.4 C)  SpO2: 100%   Filed Weights   04/03/18 1403  Weight: 131 lb (59.4 kg)    GENERAL:alert, no distress and comfortable SKIN: skin color, texture, turgor are normal, no rashes or significant lesions EYES: normal, conjunctiva are pink and non-injected, sclera clear OROPHARYNX:no exudate, normal lips, buccal mucosa, and tongue  NECK: supple, thyroid normal size, non-tender, without nodularity LYMPH:  no palpable lymphadenopathy in the cervical, axillary or inguinal LUNGS: clear to auscultation and percussion with normal breathing effort HEART: regular rate & rhythm and no murmurs without lower extremity edema ABDOMEN:abdomen soft, non-tender and normal bowel sounds Musculoskeletal:no cyanosis of digits and no clubbing  PSYCH: alert & oriented x 3 with fluent speech NEURO: no focal motor/sensory deficits  LABORATORY DATA:  I have reviewed the data as listed Lab Results  Component Value Date   WBC 6.1 04/03/2018   HGB 11.9 04/03/2018   HCT 35.5 04/03/2018   MCV 100.0 04/03/2018   PLT 155 04/03/2018   Recent Labs    11/11/17 1145 12/15/17 1202 03/24/18 1521  NA 141 137 138  K 4.0 4.3 4.2  CL  --  101 104  CO2 29 28 27   GLUCOSE 81 87 93  BUN 10.9 14 14  CREATININE 0.7 0.76 0.77  CALCIUM 9.6 9.1 9.3  GFRNONAA  --  >60 >60  GFRAA  --  >60 >60    RADIOGRAPHIC STUDIES: I have reviewed the imaging study with the patient and her husband I have personally reviewed the radiological images as listed and agreed with the findings in the report. Ct Abdomen Pelvis W Contrast  Result Date: 03/30/2018 CLINICAL DATA:  Recurrent poorly differentiated ovarian carcinoma. Rising CA 125 level. EXAM: CT ABDOMEN AND PELVIS WITH CONTRAST TECHNIQUE: Multidetector CT imaging of the abdomen and pelvis was performed using the standard protocol following bolus  administration of intravenous contrast. CONTRAST:  11m OMNIPAQUE IOHEXOL 300 MG/ML  SOLN COMPARISON:  11/15/2017 FINDINGS: Lower Chest: No acute findings. Hepatobiliary: No hepatic masses identified. Gallbladder is unremarkable. Pancreas:  No mass or inflammatory changes. Spleen: Ill-defined soft tissue mass in the gastrosplenic ligament which involves the medial aspect of the spleen shows mild increase in size. This currently measures 3.8 x 3.7 cm on image 17/2 compared to 2.7 x 2.5 cm previously. Adrenals/Urinary Tract: No masses identified. No evidence of hydronephrosis. Stomach/Bowel: No evidence of obstruction, inflammatory process or abnormal fluid collections. Large colonic stool burden shows no significant change. Vascular/Lymphatic: No pathologically enlarged lymph nodes. No abdominal aortic aneurysm. Aortic atherosclerosis. Reproductive: Prior hysterectomy. Two adjacent masses in the left adnexa show mild increase since previous study. These measure 4.8 x 3.0 cm on image 54/2, compared to 3.6 x 3.6 cm previously, and 4.5 x 3.1 cm on image 58/2, compared to 3.4 x 3.2 cm previously. Other: Peritoneal mass is again seen along the lateral wall of the cecum which measures 2.8 x 2.4 cm compared to 2.9 x 2.2 cm previously. No new peritoneal masses are identified. No evidence of ascites. Musculoskeletal:  No suspicious bone lesions identified. IMPRESSION: Mild increase in size of peritoneal metastases in the left upper quadrant and pelvis. No evidence of ascites. Large stool burden noted; recommend clinical correlation for possible constipation. Electronically Signed   By: JEarle GellM.D.   On: 03/30/2018 12:46    ASSESSMENT & PLAN:  Recurrent carcinoma of ovary ([place] The patient understood that treatment goal is strictly palliative I recommend single agent carboplatin, AUC of 5 every 3 weeks without G-CSF support I recommend minimum 3-4 cycles of treatment before repeat imaging study Currently, she is  not symptomatic We also discussed enrollment to research and she agreed to proceed  We discussed some of the risks, benefits, side-effects of carboplatin  Some of the short term side-effects included, though not limited to, including weight loss, life threatening infections, risk of allergic reactions, need for transfusions of blood products, nausea, vomiting, change in bowel habits, loss of hair, admission to hospital for various reasons, and risks of death.   Long term side-effects are also discussed including risks of infertility, permanent damage to nerve function, hearing loss, chronic fatigue, kidney damage with possibility needing hemodialysis, and rare secondary malignancy including bone marrow disorders.  The patient is aware that the response rates discussed earlier is not guaranteed.  After a long discussion, patient made an informed decision to proceed with the prescribed plan of care.  I will get blood drawn today to start treatment next week I will see her back prior to cycle 2 of treatment for toxicity review   Chemotherapy-induced peripheral neuropathy ([facility] She has minimum grade 1 neuropathy only I recommend close observation only  Other constipation She has chronic constipation, multifactorial in nature She will continue chronic laxative therapy  Goals of care, counseling/discussion The patient is aware she has incurable disease and treatment is strictly palliative. We discussed importance of Advanced Directives and Living will. She has advanced directives and living will at home.  Her husband, Levada Dy is the medical healthcare power of attorney We discussed CODE STATUS; the patient desires to remain in full code.   Orders Placed This Encounter  Procedures  . CBC with Differential (Cancer Center Only)    Standing Status:   Standing    Number of Occurrences:   20    Standing Expiration Date:   04/04/2019  . CMP ([facility] only)    Standing Status:   Standing     Number of Occurrences:   20    Standing Expiration Date:   04/04/2019    All questions were answered. The patient knows to call the clinic with any problems, questions or concerns. I spent 55 minutes counseling the patient face to face. The total time spent in the appointment was 70 minutes and more than 50% was on counseling.     Heath Lark, MD 04/03/2018 3:39 PM

## 2018-04-03 NOTE — Assessment & Plan Note (Signed)
She has chronic constipation, multifactorial in nature She will continue chronic laxative therapy

## 2018-04-03 NOTE — Assessment & Plan Note (Signed)
She has minimum grade 1 neuropathy only I recommend close observation only

## 2018-04-03 NOTE — Assessment & Plan Note (Signed)
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 We discussed importance of Advanced Directives and Living will. She has advanced directives and living will at home.  Her husband, Levada Dy is the medical healthcare power of attorney We discussed CODE STATUS; the patient desires to remain in full code.

## 2018-04-03 NOTE — Progress Notes (Signed)
  Oncology Nurse Navigator Documentation  Navigator Location: [facility] (04/03/18 1533)   )    Abnormal Finding Date: 03/30/18 (04/03/18 1533)                 Patient Visit Type: MedOnc (04/03/18 1533) Treatment Phase: Pre-Tx/Tx Discussion (04/03/18 1533) Barriers/Navigation Needs: Education (04/03/18 1533) Education: Preparing for Upcoming Surgery/ Treatment (04/03/18 1533)              Acuity: Level 2 (04/03/18 1533)   Acuity Level 2: Initial guidance, education and coordination as needed (04/03/18 1533)     Time Spent with Patient: 15 (04/03/18 1533)

## 2018-04-03 NOTE — Progress Notes (Signed)
START ON PATHWAY REGIMEN - Ovarian     A cycle is every 21 days:     Carboplatin   **Always confirm dose/schedule in your pharmacy ordering system**    Patient Characteristics: Recurrent or Progressive Disease, Third Line Therapy, Platinum Sensitive and >  6 Months Since Last Platinum Therapy, BRCA Mutation Absent/Unknown Therapeutic Status: Recurrent or Progressive Disease AJCC T Category: T3 AJCC N Category: N0 AJCC M Category: M0 AJCC 8 Stage Grouping: Unknown BRCA Mutation Status: Absent Line of Therapy: Third Line  Intent of Therapy: Non-Curative / Palliative Intent, Discussed with Patient

## 2018-04-03 NOTE — Assessment & Plan Note (Signed)
The patient understood that treatment goal is strictly palliative I recommend single agent carboplatin, AUC of 5 every 3 weeks without G-CSF support I recommend minimum 3-4 cycles of treatment before repeat imaging study Currently, she is not symptomatic We also discussed enrollment to research and she agreed to proceed  We discussed some of the risks, benefits, side-effects of carboplatin  Some of the short term side-effects included, though not limited to, including weight loss, life threatening infections, risk of allergic reactions, need for transfusions of blood products, nausea, vomiting, change in bowel habits, loss of hair, admission to hospital for various reasons, and risks of death.   Long term side-effects are also discussed including risks of infertility, permanent damage to nerve function, hearing loss, chronic fatigue, kidney damage with possibility needing hemodialysis, and rare secondary malignancy including bone marrow disorders.  The patient is aware that the response rates discussed earlier is not guaranteed.  After a long discussion, patient made an informed decision to proceed with the prescribed plan of care.  I will get blood drawn today to start treatment next week I will see her back prior to cycle 2 of treatment for toxicity review

## 2018-04-04 ENCOUNTER — Other Ambulatory Visit: Payer: Self-pay | Admitting: Hematology and Oncology

## 2018-04-04 LAB — CA 125: CANCER ANTIGEN (CA) 125: 120.9 U/mL — AB (ref 0.0–38.1)

## 2018-04-10 ENCOUNTER — Inpatient Hospital Stay: Payer: Medicare Other

## 2018-04-10 VITALS — BP 122/72 | HR 63 | Temp 97.9°F | Resp 18 | Wt 131.0 lb

## 2018-04-10 DIAGNOSIS — Z7189 Other specified counseling: Secondary | ICD-10-CM

## 2018-04-10 DIAGNOSIS — Z5111 Encounter for antineoplastic chemotherapy: Secondary | ICD-10-CM | POA: Diagnosis not present

## 2018-04-10 DIAGNOSIS — C569 Malignant neoplasm of unspecified ovary: Secondary | ICD-10-CM

## 2018-04-10 MED ORDER — DEXAMETHASONE SODIUM PHOSPHATE 10 MG/ML IJ SOLN
10.0000 mg | Freq: Once | INTRAMUSCULAR | Status: AC
  Administered 2018-04-10: 10 mg via INTRAVENOUS

## 2018-04-10 MED ORDER — SODIUM CHLORIDE 0.9 % IV SOLN
Freq: Once | INTRAVENOUS | Status: AC
  Administered 2018-04-10: 09:00:00 via INTRAVENOUS

## 2018-04-10 MED ORDER — SODIUM CHLORIDE 0.9% FLUSH
10.0000 mL | INTRAVENOUS | Status: DC | PRN
  Administered 2018-04-10: 10 mL
  Filled 2018-04-10: qty 10

## 2018-04-10 MED ORDER — DEXAMETHASONE SODIUM PHOSPHATE 10 MG/ML IJ SOLN
INTRAMUSCULAR | Status: AC
  Filled 2018-04-10: qty 1

## 2018-04-10 MED ORDER — HEPARIN SOD (PORK) LOCK FLUSH 100 UNIT/ML IV SOLN
500.0000 [IU] | Freq: Once | INTRAVENOUS | Status: AC | PRN
  Administered 2018-04-10: 500 [IU]
  Filled 2018-04-10: qty 5

## 2018-04-10 MED ORDER — DIPHENHYDRAMINE HCL 50 MG/ML IJ SOLN
INTRAMUSCULAR | Status: AC
  Filled 2018-04-10: qty 1

## 2018-04-10 MED ORDER — PALONOSETRON HCL INJECTION 0.25 MG/5ML
INTRAVENOUS | Status: AC
  Filled 2018-04-10: qty 5

## 2018-04-10 MED ORDER — SODIUM CHLORIDE 0.9 % IV SOLN
360.0000 mg | Freq: Once | INTRAVENOUS | Status: AC
  Administered 2018-04-10: 360 mg via INTRAVENOUS
  Filled 2018-04-10: qty 36

## 2018-04-10 MED ORDER — FAMOTIDINE IN NACL 20-0.9 MG/50ML-% IV SOLN
INTRAVENOUS | Status: AC
Start: 2018-04-10 — End: ?
  Filled 2018-04-10: qty 50

## 2018-04-10 MED ORDER — FAMOTIDINE IN NACL 20-0.9 MG/50ML-% IV SOLN
20.0000 mg | Freq: Once | INTRAVENOUS | Status: AC
  Administered 2018-04-10: 20 mg via INTRAVENOUS

## 2018-04-10 MED ORDER — DIPHENHYDRAMINE HCL 50 MG/ML IJ SOLN
25.0000 mg | Freq: Once | INTRAMUSCULAR | Status: AC
  Administered 2018-04-10: 25 mg via INTRAVENOUS

## 2018-04-10 MED ORDER — PALONOSETRON HCL INJECTION 0.25 MG/5ML
0.2500 mg | Freq: Once | INTRAVENOUS | Status: AC
  Administered 2018-04-10: 0.25 mg via INTRAVENOUS

## 2018-04-10 NOTE — Progress Notes (Signed)
Using single agent Carboplatin - Use AUC 5 per MD  Raul Del, PharmD, BCPS, BCOP

## 2018-04-11 ENCOUNTER — Telehealth: Payer: Self-pay | Admitting: *Deleted

## 2018-04-11 NOTE — Telephone Encounter (Signed)
Called patient for chemo follow up. States she did very well. Eating and drinking without problems. Bowels moving without problems. No questions or concerns.

## 2018-04-12 ENCOUNTER — Telehealth: Payer: Self-pay | Admitting: Gynecologic Oncology

## 2018-04-12 NOTE — Telephone Encounter (Signed)
Returned call to patient. She had called wondering if she could continue taking tamoxifen after she completed chemotherapy since she felt it helped some even though her CA 125 increased and she had progression while taking it.  Discussed with Dr. C-P and he states he does not feel that it would benefit her since she had progression while taking and that the medication was not without risk. Patient verbalizing understanding and agreeable with plan.

## 2018-04-17 ENCOUNTER — Telehealth: Payer: Self-pay | Admitting: Cardiology

## 2018-04-17 NOTE — Telephone Encounter (Signed)
New Message:   Pt she have started on Chemo and she wants Dr Ellyn Hack to review her medicine and Chemo medicine please. She wants to see if they are compatable to each other.

## 2018-04-17 NOTE — Telephone Encounter (Signed)
Patient would like for Dr Ellyn Hack, her doctor to review and make sure ok. Will forward to Dr Ellyn Hack for review

## 2018-04-18 NOTE — Telephone Encounter (Signed)
I think that they are recommending carboplatin - I am not of med interactions.  Will review with our Pharmacists.   Glenetta Hew, MD

## 2018-04-19 NOTE — Telephone Encounter (Signed)
Patient chemotherapy includes carboplatin infusions.  ALL cardiac medication on her list are safe to take with carboplatin.  Recommend to keep ondansetron (for nausea) to an absolute minimum. Prefer prochlorperazine (Compazine) for nausea when possible.

## 2018-04-19 NOTE — Telephone Encounter (Signed)
Advised patient, verbalized understanding  

## 2018-04-25 ENCOUNTER — Telehealth: Payer: Self-pay | Admitting: *Deleted

## 2018-04-25 NOTE — Telephone Encounter (Signed)
[name] states she is "a little better". Has been using robitussin and cough drops. No fever or signs of infection

## 2018-04-25 NOTE — Telephone Encounter (Signed)
Called patient for follow up. Had called the after hours call center on Sunday to report cough which she thought was allergies. LM to call Dr Calton Dach nurse.

## 2018-04-28 ENCOUNTER — Ambulatory Visit
Admission: RE | Admit: 2018-04-28 | Discharge: 2018-04-28 | Disposition: A | Discharge status: Home / Self Care | Payer: Medicare Other | Source: Ambulatory Visit | Attending: Internal Medicine | Admitting: Internal Medicine

## 2018-04-28 DIAGNOSIS — Z1231 Encounter for screening mammogram for malignant neoplasm of breast: Secondary | ICD-10-CM

## 2018-05-02 ENCOUNTER — Other Ambulatory Visit: Payer: Medicare Other

## 2018-05-02 ENCOUNTER — Telehealth: Payer: Self-pay | Admitting: Hematology and Oncology

## 2018-05-02 ENCOUNTER — Inpatient Hospital Stay (HOSPITAL_BASED_OUTPATIENT_CLINIC_OR_DEPARTMENT_OTHER): Payer: Medicare Other | Admitting: Hematology and Oncology

## 2018-05-02 ENCOUNTER — Encounter: Payer: Self-pay | Admitting: Hematology and Oncology

## 2018-05-02 ENCOUNTER — Ambulatory Visit: Payer: Medicare Other

## 2018-05-02 ENCOUNTER — Encounter: Payer: Self-pay | Admitting: Oncology

## 2018-05-02 ENCOUNTER — Inpatient Hospital Stay: Payer: Medicare Other

## 2018-05-02 ENCOUNTER — Ambulatory Visit: Payer: Medicare Other | Admitting: Hematology and Oncology

## 2018-05-02 ENCOUNTER — Inpatient Hospital Stay: Payer: Medicare Other | Attending: Gynecology

## 2018-05-02 VITALS — BP 127/72 | HR 66 | Temp 97.5°F | Resp 18 | Ht 63.0 in | Wt 131.5 lb

## 2018-05-02 DIAGNOSIS — G620 Drug-induced polyneuropathy: Secondary | ICD-10-CM

## 2018-05-02 DIAGNOSIS — Z7189 Other specified counseling: Secondary | ICD-10-CM

## 2018-05-02 DIAGNOSIS — D61818 Other pancytopenia: Secondary | ICD-10-CM

## 2018-05-02 DIAGNOSIS — Z95828 Presence of other vascular implants and grafts: Secondary | ICD-10-CM

## 2018-05-02 DIAGNOSIS — T451X5A Adverse effect of antineoplastic and immunosuppressive drugs, initial encounter: Secondary | ICD-10-CM

## 2018-05-02 DIAGNOSIS — C569 Malignant neoplasm of unspecified ovary: Secondary | ICD-10-CM

## 2018-05-02 DIAGNOSIS — K5909 Other constipation: Secondary | ICD-10-CM

## 2018-05-02 DIAGNOSIS — K5900 Constipation, unspecified: Secondary | ICD-10-CM | POA: Diagnosis not present

## 2018-05-02 DIAGNOSIS — Z5111 Encounter for antineoplastic chemotherapy: Secondary | ICD-10-CM | POA: Insufficient documentation

## 2018-05-02 DIAGNOSIS — C7889 Secondary malignant neoplasm of other digestive organs: Secondary | ICD-10-CM

## 2018-05-02 DIAGNOSIS — C561 Malignant neoplasm of right ovary: Secondary | ICD-10-CM

## 2018-05-02 DIAGNOSIS — C7989 Secondary malignant neoplasm of other specified sites: Secondary | ICD-10-CM

## 2018-05-02 DIAGNOSIS — D61810 Antineoplastic chemotherapy induced pancytopenia: Secondary | ICD-10-CM

## 2018-05-02 LAB — CBC WITH DIFFERENTIAL (CANCER CENTER ONLY)
BASOS ABS: 0 10*3/uL (ref 0.0–0.1)
Basophils Relative: 1 %
Eosinophils Absolute: 0 10*3/uL (ref 0.0–0.5)
Eosinophils Relative: 1 %
HEMATOCRIT: 32 % — AB (ref 34.8–46.6)
Hemoglobin: 10.7 g/dL — ABNORMAL LOW (ref 11.6–15.9)
LYMPHS PCT: 39 %
Lymphs Abs: 1.2 10*3/uL (ref 0.9–3.3)
MCH: 33.5 pg (ref 25.1–34.0)
MCHC: 33.5 g/dL (ref 31.5–36.0)
MCV: 99.9 fL (ref 79.5–101.0)
Monocytes Absolute: 0.3 10*3/uL (ref 0.1–0.9)
Monocytes Relative: 9 %
NEUTROS ABS: 1.6 10*3/uL (ref 1.5–6.5)
Neutrophils Relative %: 50 %
Platelet Count: 81 10*3/uL — ABNORMAL LOW (ref 145–400)
RBC: 3.21 MIL/uL — AB (ref 3.70–5.45)
RDW: 14 % (ref 11.2–14.5)
WBC: 3.1 10*3/uL — AB (ref 3.9–10.3)

## 2018-05-02 LAB — CMP (CANCER CENTER ONLY)
ALBUMIN: 3.8 g/dL (ref 3.5–5.0)
ALT: 9 U/L (ref 0–55)
ANION GAP: 9 (ref 3–11)
AST: 17 U/L (ref 5–34)
Alkaline Phosphatase: 61 U/L (ref 40–150)
BILIRUBIN TOTAL: 0.5 mg/dL (ref 0.2–1.2)
BUN: 11 mg/dL (ref 7–26)
CHLORIDE: 104 mmol/L (ref 98–109)
CO2: 24 mmol/L (ref 22–29)
Calcium: 8.7 mg/dL (ref 8.4–10.4)
Creatinine: 0.79 mg/dL (ref 0.60–1.10)
GFR, Est AFR Am: >60 mL/min (ref >60)
GFR, Estimated: >60 mL/min (ref >60)
Glucose, Bld: 102 mg/dL (ref 70–140)
POTASSIUM: 3.8 mmol/L (ref 3.5–5.1)
SODIUM: 137 mmol/L (ref 136–145)
TOTAL PROTEIN: 6.1 g/dL — AB (ref 6.4–8.3)

## 2018-05-02 MED ORDER — HEPARIN SOD (PORK) LOCK FLUSH 100 UNIT/ML IV SOLN
500.0000 [IU] | Freq: Once | INTRAVENOUS | Status: AC
  Administered 2018-05-02: 500 [IU] via INTRAVENOUS
  Filled 2018-05-02: qty 5

## 2018-05-02 MED ORDER — SODIUM CHLORIDE 0.9% FLUSH
10.0000 mL | Freq: Once | INTRAVENOUS | Status: AC
  Administered 2018-05-02: 10 mL
  Filled 2018-05-02: qty 10

## 2018-05-02 NOTE — Assessment & Plan Note (Signed)
She denies worsening neuropathy with treatment We will observe for now 

## 2018-05-02 NOTE — Assessment & Plan Note (Signed)
She is not symptomatic from pancytopenia I suspect her pancytopenia is quite severe now because of repeated exposure to treatments I recommend dose reduction and holding off treatment until next week to allow bone marrow recovery and she agreed

## 2018-05-02 NOTE — Assessment & Plan Note (Signed)
Unfortunately, she developed significant pancytopenia from recent treatment I recommend dose reduction of 20% and we will delay treatment by a week I recommend minimum 3 months of treatment before repeat imaging study for objective assessment of response to therapy CA-125 is pending

## 2018-05-02 NOTE — Assessment & Plan Note (Signed)
Patient is questioning whether the treatment is working on not I told her it is too soon to tell after just 1 dose of treatment Clinically, she appears stable I recommend we continue plan of care as discussed with repeat imaging study in 3 months She had no further questions in regards to the goals of care

## 2018-05-02 NOTE — Telephone Encounter (Signed)
Gave patient AVS and calendar of upcoming June and July appointments.  °

## 2018-05-02 NOTE — Assessment & Plan Note (Signed)
She has stable chronic constipation and bloating She will continue laxative therapy 

## 2018-05-02 NOTE — Progress Notes (Signed)
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[place]) Unfortunately, she developed significant pancytopenia from recent treatment I recommend dose reduction of 20% and we will delay treatment by a week I recommend minimum 3 months of treatment before repeat imaging study for objective assessment of response to therapy CA-125 is pending  Pancytopenia, acquired ([place]) She is not symptomatic from pancytopenia I suspect her pancytopenia is quite severe now because of repeated exposure to treatments I recommend dose reduction and holding off treatment until next week to allow bone marrow recovery and she agreed    Chemotherapy-induced peripheral neuropathy ([place]) She denies worsening neuropathy with treatment We will observe for now  Other constipation She has stable chronic constipation and bloating She will continue laxative therapy  Goals of care, counseling/discussion Patient is questioning whether the treatment is working on not I told her it is too soon to tell after just 1 dose of treatment Clinically, she appears stable I recommend we continue plan of care as discussed with repeat imaging study in 3 months She had no further questions in regards to the goals of care   No orders of the defined types were placed in this encounter.   INTERVAL HISTORY: Please see below for problem oriented charting. She returns with her husband to be seen prior to cycle 2 of treatment She tolerated last cycle of therapy well Denies recent infection She had mild nausea that resolved with antiemetics Denies changes to her chronic constipation No worsening peripheral neuropathy The patient denies any recent signs or symptoms of bleeding such as spontaneous epistaxis, hematuria or hematochezia.   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  Surgical consultation is advised.  No findings to suggest metastatic disease in the abdomen/pelvis      02/13/2013 Pathology Results    1. Adnexa - ovary +/- tube, neoplastic - HIGH GRADE OVARIAN POORLY DIFFERENTIATED CARCINOMA WITH ASSOCIATED TUMOR NECROSIS, 11.5 CM, INVOLVING THE OVARIAN CAPSULE AND ADJACENT FALLOPIAN TUBE. - PLEASE SEE ONCOLOGY TEMPLATE FOR DETAILS. 2. Ovary and fallopian tube, left - HIGH GRADE OVARIAN POORLY DIFFERENTIATED CARCINOMA WITH ASSOCIATED TUMOR NECROSIS, 2.3 CM, INVOLVING THE OVARIAN CAPSULE. - FALLOPIAN TUBAL TISSUE, NO EVIDENCE OF MALIGNANCY. - PLEASE SEE ONCOLOGY TEMPLATE FOR DETAILS. 3. Uterus and cervix - ENDOMETRIUM: BENIGN WEAKLY PROLIFERATIVE ENDOMETRIUM, NO ATYPIA, HYPERPLASIA OR MALIGNANCY. - CERVIX: BENIGN SQUAMOUS MUCOSA AND ENDOCERVICAL MUCOSA, NO DYSPLASIA OR MALIGNANCY. - UTERINE SEROSA: ADHESIONS, NO EVIDENCE OF ENDOMETRIOSIS, ATYPIA OR MALIGNANCY. 4. Cul-de-sac biopsy - POSITIVE FOR METASTATIC CARCINOMA (2.0 CM). 5. Soft tissue mass, simple excision, peri rectal - POSITIVE FOR METASTATIC CARCINOMA (2.6 CM). 6. Lymph nodes, regional resection, right pelvic - SEVEN LYMPH NODES, NEGATIVE FOR METASTATIC CARCINOMA (0/7). 7. Omentum, resection for tumor - MATURE ADIPOSE TISSUE, NO EVIDENCE OF MALIGNANCY. Microscopic Comment 1. OVARY Specimen(s): Right and left ovaries and fallopian tubes, uterus and cervix  Procedure(s): Total hysterectomy, bilateral salpingo-oophorectomy Lymph node sampling performed:  Yes Primary tumor site: Right and left ovaries Ovarian surface involvement: Present Maximum tumor size (cm): Right ovary: 11.5 cm; left ovary: 2.3 cm, gross measurement. 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 please see comment. Grade: High grade Peritoneal implants: No Pelvic extension: Cul-de-sac biopsy and perirectal soft tissue biopsy: positive Peritoneal washings: Rare cluster of atypical cells present ([id number]) Lymph nodes: number examined 7 ; number positive 0 TNM code: pT2c, pN0 FIGO Stage (based on pathologic findings, needs clinical correlation): II B (2014 edition) Comments: Sections of bilateral ovaries show sheets and nests of malignant epithelium resembling transitional cell carcinoma with associated tumor necrosis. No evidence of Brenner's tumor or conventional endometrioid carcinoma is identified in the background. The bilateral ovarian capsules are involved. Immunohistochemical stains were performed and the malignant cells are strongly positive for cytokeratin 7, WT-1, p16 and p53, negative for cytokeratin 20. 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 The tumor involves the right fallopian tube. Both cul-de-sac and perirectal soft tissue biopsies are positive for metastatic carcinoma. The omentum biopsy is negative for tumor.       03/20/2013 - 08/14/2013 Chemotherapy    The patient had 6 cycles of carboplatin and taxol      09/25/2013 Imaging    1. Interval resection of previously described right pelvic mass. No evidence of recurrent or metastatic disease. 2. Small hiatal hernia. 3. Moderate amount of stool within the rectum. Question constipation or even mild fecal impaction.      04/03/2015 PET scan    1. Highly hypermetabolic height bowed dense mass in the medial spleen compatible with malignancy. 2.  Bilateral pelvic sidewall adenopathy, right greater than left, hypermetabolic, with small peritoneal implants of tumor in the lower pelvis, and with several faint but suspected omental implants of tumor. There is also a hypermetabolic aortocaval lymph node in the retroperitoneum. 3. Generalized increased thyroid activity could be due to thyroiditis. Although a well-defined nodule is not seen in the right thyroid lobe, there is focal increased hypermetabolic activity in the right thyroid lobe. Thyroid ultrasound is recommended for further investigation. 4. I suspect that the high activity between the spinous processes of L3 and L4 is degenerative.      05/01/2015 - 10/21/2015 Chemotherapy    The patient had 8 cycles of carboplatin, taxol and Avastin.        07/02/2015 Imaging    1. Decrease in size of these splenic lesion. Suspect tumor also involving the adjacent posterior wall of the stomach. 2. Improved pelvic lymphadenopathy. 3. No new omental or peritoneal surface disease.       09/10/2015 Imaging    1. Response to therapy, with further decreased size of a medial splenic lesion. The previously questioned extension into the stomach is no longer identified and may have been artifactual on the prior exam. Of note, an inferior splenic low-density capsular based lesion is felt to be similar and warrants followup attention. 2. No adenopathy or new peritoneal disease identified. 3.  Possible constipation. 4.  Advanced aortic and branch vessel atherosclerosis.      11/06/2015 Imaging    1. Mass arising from the medial spleen and involving the posterior wall of stomach demonstrates mild decrease in size in the interval. 2. Interval improvement and previously referenced pathologic iliac lymph nodes. 3. Previously referenced peritoneal implants within the pelvis are  no longer measurable. No new areas of peritoneal metastasis is identified. 4. There is a right hilar node which is borderline enlarged  but is increased in size when compared with 04/19/2013. Attention on follow-up examination is recommended.      11/18/2015 PET scan    1. Residual hypermetabolic tumor implant between the stomach and spleen, significantly decreased in size and mildly decreased in metabolism compared to 04/03/2015 PET-CT, and slightly decreased in size since 11/06/2015. 2. Residual hypermetabolic right deep pelvic side wall lymph node, with partial treatment response compared to 04/03/2015 PET-CT. 3. No new sites of hypermetabolic metastatic disease. 4. Stable diffuse thyroid hypermetabolism without discrete thyroid nodule, most suggestive of diffuse thyroiditis. Recommend correlation with serum thyroid function tests.       01/28/2016 - 11/26/2016 Anti-estrogen oral therapy    She was placed on Letrozole, stopped due to poor tolerance      02/09/2016 Imaging    1. Continued decrease in size of peritoneal lesion between the posterior wall of stomach and spleen. 2. No new or progressive disease identified      06/23/2016 Imaging    1. Near complete resolution of subtle residual soft tissue density lesion in the gastrosplenic ligament. 2. No new sites of disease identified. 3. Stool burden suggests constipation or even fecal impaction      12/01/2016 Imaging    1. No CT findings for residual or recurrent abdominal/pelvic ovarian cancer. 2. No acute abdominal/pelvic findings. 3. Large amount of stool throughout the colon and down into the rectum suggesting constipation. 4. Stable atherosclerotic calcifications involving the aorta and iliac arteries.      03/29/2017 - 10/29/2017 Anti-estrogen oral therapy    She was on Aromasin      04/19/2017 Imaging    1. Suspicion of peritoneal recurrence with enlarging implants in the gastrosplenic ligament, left adnexa and posterior to the cecum. No generalized ascites, adenopathy or definite solid organ disease. 2. No evidence of bowel or ureteral obstruction. 3.   Aortic Atherosclerosis (ICD10-I70.0).      06/25/2017 Genetic Testing    Patient has genetic testing done for Breast/ovarian cancer panel Results revealed patient has no detectable mutation      09/01/2017 Tumor Marker    Patient's tumor was tested for the following markers: CA-125 Results of the tumor marker test revealed 40.9      10/29/2017 - 03/24/2018 Anti-estrogen oral therapy    She was on tamoxifen      11/05/2017 Tumor Marker    Patient's tumor was tested for the following markers: CA-125 Results of the tumor marker test revealed 84.4      11/15/2017 Imaging    1. Mild progression of peritoneal metastasis. 2. Possible constipation. No obstruction or other acute complication. 3.  Aortic Atherosclerosis (ICD10-I70.0).      03/21/2018 Tumor Marker    Patient's tumor was tested for the following markers: CA-125 Results of the tumor marker test revealed 101.3.      04/03/2018 Tumor Marker    Patient's tumor was tested for the following markers: CA-125 Results of the tumor marker test revealed 120.9       REVIEW OF SYSTEMS:   Constitutional: Denies fevers, chills or abnormal weight loss Eyes: Denies blurriness of vision Ears, nose, mouth, throat, and face: Denies mucositis or sore throat Respiratory: Denies cough, dyspnea or wheezes Cardiovascular: Denies palpitation, chest discomfort or lower extremity swelling Skin: Denies abnormal skin rashes Lymphatics: Denies new lymphadenopathy or easy bruising Neurological:Denies numbness, tingling or new  weaknesses Behavioral/Psych: Mood is stable, no new changes  All other systems were reviewed with the patient and are negative.  I have reviewed the past medical history, past surgical history, social history and family history with the patient and they are unchanged from previous note.  ALLERGIES:  is allergic to clonidine derivatives; coreg [carvedilol]; caffeine; codeine; flexeril [cyclobenzaprine]; lipitor [atorvastatin];  lisinopril; pravastatin; tape; tegaderm ag mesh [silver]; and ultram [tramadol].  MEDICATIONS:  Current Outpatient Medications  Medication Sig Dispense Refill  . acetaminophen (TYLENOL) 500 MG tablet Take 1,000 mg by mouth every 6 (six) hours as needed for mild pain.     Marland Kitchen amiodarone (PACERONE) 200 MG tablet Take 100 mg by mouth daily.  3  . amiodarone (PACERONE) 200 MG tablet TAKE 1/2 TABLET BY MOUTH DAILY 45 tablet 3  . amoxicillin (AMOXIL) 500 MG capsule Take 2,000 mg by mouth See admin instructions. Takes 4 capsules 1 hour prior to procedure    . Biotin 10 MG TABS Take 10 mg by mouth daily.     Marland Kitchen desonide (DESOWEN) 0.05 % cream Apply 1 application topically 2 (two) times daily.   1  . diphenhydrAMINE (BENADRYL) 25 MG tablet Take 50 mg by mouth at bedtime as needed for sleep.     Marland Kitchen docusate sodium (COLACE) 100 MG capsule Take 250 mg by mouth 2 (two) times daily.     Marland Kitchen ELIQUIS 5 MG TABS tablet TAKE 1 TABLET (5 MG TOTAL) BY MOUTH 2 (TWO) TIMES DAILY. 180 tablet 1  . Evolocumab with Infusor 420 MG/3.5ML SOCT Inject into the skin.    Marland Kitchen ezetimibe (ZETIA) 10 MG tablet Take 1 tablet (10 mg total) by mouth daily. (Patient taking differently: Take 10 mg by mouth at bedtime. ) 30 tablet 6  . famotidine (PEPCID) 20 MG tablet Take 20 mg by mouth 2 (two) times daily.    . Glucosamine HCl 1000 MG TABS Take 1,000 mg by mouth daily.    . hydrocortisone 1 % ointment Apply 1 application topically 2 (two) times daily as needed for itching (eczema).     Marland Kitchen ipratropium (ATROVENT) 0.06 % nasal spray Place 2 sprays into both nostrils daily.   5  . irbesartan (AVAPRO) 300 MG tablet TAKE 1 TABLET (300 MG TOTAL) BY MOUTH DAILY. 90 tablet 1  . levothyroxine (SYNTHROID) 88 MCG tablet Take 88 mcg by mouth daily before breakfast.    . lidocaine-prilocaine (EMLA) cream Apply to Porta-Cath site 1-2 hours prior to access as directed. 30 g 1  . magnesium hydroxide (MILK OF MAGNESIA) 800 MG/5ML suspension Take 30 mLs by mouth  daily as needed for constipation. Reported on 11/20/2015    . Melatonin 5 MG CAPS Take 5 mg by mouth at bedtime as needed (sleep). Reported on 12/25/2015    . Menthol, Topical Analgesic, (ICY HOT EX) Apply 1 application topically 2 (two) times daily as needed (PAIN).     . Multiple Vitamin (MULTIVITAMIN WITH MINERALS) TABS tablet Take 1 tablet by mouth daily. Centrum Silver    . ondansetron (ZOFRAN) 8 MG tablet Take 1 tablet (8 mg total) by mouth 2 (two) times daily as needed for refractory nausea / vomiting. Start on day 3 after chemo. 30 tablet 1  . OVER THE COUNTER MEDICATION Co210 20 mg once a day for muscle aches.    Vladimir Faster Glycol-Propyl Glycol (SYSTANE OP) Place 1 drop into both eyes 2 (two) times daily.     . polyethylene glycol (MIRALAX / GLYCOLAX) packet  Take 17 g by mouth daily. Mix in 8 oz liquid and drink    . potassium chloride (K-DUR) 10 MEQ tablet Take 10 mEq by mouth every Monday, Wednesday, and Friday.    . prochlorperazine (COMPAZINE) 10 MG tablet Take 1 tablet (10 mg total) by mouth every 6 (six) hours as needed (Nausea or vomiting). 30 tablet 1  . SHINGRIX injection     . spironolactone (ALDACTONE) 25 MG tablet Take 25 mg by mouth See admin instructions. Takes on Tuesdays, Thursday, Saturday, Sunday     No current facility-administered medications for this visit.    Facility-Administered Medications Ordered in Other Visits  Medication Dose Route Frequency Provider Last Rate Last Dose  . sodium chloride flush (NS) 0.9 % injection 10 mL  10 mL Intravenous PRN Joylene John D, NP   10 mL at 01/06/18 1220    PHYSICAL EXAMINATION: ECOG PERFORMANCE STATUS: 1 - Symptomatic but completely ambulatory  Vitals:   05/02/18 0913  BP: 127/72  Pulse: 66  Resp: 18  Temp: (!) 97.5 F (36.4 C)  SpO2: 100%   Filed Weights   05/02/18 0913  Weight: 131 lb 8 oz (59.6 kg)    GENERAL:alert, no distress and comfortable SKIN: skin color, texture, turgor are normal, no rashes or  significant lesions EYES: normal, Conjunctiva are pink and non-injected, sclera clear OROPHARYNX:no exudate, no erythema and lips, buccal mucosa, and tongue normal  NECK: supple, thyroid normal size, non-tender, without nodularity LYMPH:  no palpable lymphadenopathy in the cervical, axillary or inguinal LUNGS: clear to auscultation and percussion with normal breathing effort HEART: regular rate & rhythm and no murmurs and no lower extremity edema ABDOMEN:abdomen soft, non-tender and normal bowel sounds Musculoskeletal:no cyanosis of digits and no clubbing  NEURO: alert & oriented x 3 with fluent speech, no focal motor/sensory deficits  LABORATORY DATA:  I have reviewed the data as listed    Component Value Date/Time   NA 137 05/02/2018 0840   NA 141 11/11/2017 1145   K 3.8 05/02/2018 0840   K 4.0 11/11/2017 1145   CL 104 05/02/2018 0840   CL 103 05/21/2013 1105   CO2 24 05/02/2018 0840   CO2 29 11/11/2017 1145   GLUCOSE 102 05/02/2018 0840   GLUCOSE 81 11/11/2017 1145   GLUCOSE 74 05/21/2013 1105   BUN 11 05/02/2018 0840   BUN 10.9 11/11/2017 1145   CREATININE 0.79 05/02/2018 0840   CREATININE 0.7 11/11/2017 1145   CALCIUM 8.7 05/02/2018 0840   CALCIUM 9.6 11/11/2017 1145   PROT 6.1 (L) 05/02/2018 0840   PROT 6.5 11/24/2016 1104   ALBUMIN 3.8 05/02/2018 0840   ALBUMIN 3.9 11/24/2016 1104   AST 17 05/02/2018 0840   AST 18 11/24/2016 1104   ALT 9 05/02/2018 0840   ALT 15 11/24/2016 1104   ALKPHOS 61 05/02/2018 0840   ALKPHOS 104 11/24/2016 1104   BILITOT 0.5 05/02/2018 0840   BILITOT 0.57 11/24/2016 1104   GFRNONAA >60 05/02/2018 0840   GFRAA >60 05/02/2018 0840    No results found for: SPEP, UPEP  Lab Results  Component Value Date   WBC 3.1 (L) 05/02/2018   NEUTROABS 1.6 05/02/2018   HGB 10.7 (L) 05/02/2018   HCT 32.0 (L) 05/02/2018   MCV 99.9 05/02/2018   PLT 81 (L) 05/02/2018      Chemistry      Component Value Date/Time   NA 137 05/02/2018 0840   NA  141 11/11/2017 1145   K 3.8 05/02/2018  0840   K 4.0 11/11/2017 1145   CL 104 05/02/2018 0840   CL 103 05/21/2013 1105   CO2 24 05/02/2018 0840   CO2 29 11/11/2017 1145   BUN 11 05/02/2018 0840   BUN 10.9 11/11/2017 1145   CREATININE 0.79 05/02/2018 0840   CREATININE 0.7 11/11/2017 1145      Component Value Date/Time   CALCIUM 8.7 05/02/2018 0840   CALCIUM 9.6 11/11/2017 1145   ALKPHOS 61 05/02/2018 0840   ALKPHOS 104 11/24/2016 1104   AST 17 05/02/2018 0840   AST 18 11/24/2016 1104   ALT 9 05/02/2018 0840   ALT 15 11/24/2016 1104   BILITOT 0.5 05/02/2018 0840   BILITOT 0.57 11/24/2016 1104       RADIOGRAPHIC STUDIES: I have personally reviewed the radiological images as listed and agreed with the findings in the report. Mm 3d Screen Breast Bilateral  Result Date: 04/28/2018 CLINICAL DATA:  Screening. EXAM: DIGITAL SCREENING BILATERAL MAMMOGRAM WITH TOMO AND CAD COMPARISON:  Previous exam(s). ACR Breast Density Category b: There are scattered areas of fibroglandular density. FINDINGS: There are no findings suspicious for malignancy. Images were processed with CAD. IMPRESSION: No mammographic evidence of malignancy. A result letter of this screening mammogram will be mailed directly to the patient. RECOMMENDATION: Screening mammogram in one year. (Code:[id number]-B-01Y) BI-RADS CATEGORY  1: Negative. Electronically Signed   By: Lajean Manes M.D.   On: 04/28/2018 15:33    All questions were answered. The patient knows to call the clinic with any problems, questions or concerns. No barriers to learning was detected.  I spent 25 minutes counseling the patient face to face. The total time spent in the appointment was 30 minutes and more than 50% was on counseling and review of test results  Heath Lark, MD 05/02/2018 11:39 AM

## 2018-05-03 ENCOUNTER — Telehealth: Payer: Self-pay

## 2018-05-03 LAB — CA 125: CANCER ANTIGEN (CA) 125: 68.2 U/mL — AB (ref 0.0–38.1)

## 2018-05-03 NOTE — Telephone Encounter (Signed)
-----   Message from Heath Lark, MD sent at 05/03/2018  8:19 AM EDT ----- Regarding: CA-125 pls let her know CA-125 is better ----- Message ----- From: Interface, Lab In [place] Sent: 05/02/2018   9:16 AM To: Heath Lark, MD

## 2018-05-03 NOTE — Telephone Encounter (Signed)
Called and given below message. Verbalized understanding,

## 2018-05-08 ENCOUNTER — Inpatient Hospital Stay: Payer: Medicare Other

## 2018-05-08 ENCOUNTER — Other Ambulatory Visit: Payer: Self-pay | Admitting: Hematology and Oncology

## 2018-05-08 DIAGNOSIS — C569 Malignant neoplasm of unspecified ovary: Secondary | ICD-10-CM

## 2018-05-08 DIAGNOSIS — Z7189 Other specified counseling: Secondary | ICD-10-CM

## 2018-05-08 DIAGNOSIS — Z5111 Encounter for antineoplastic chemotherapy: Secondary | ICD-10-CM | POA: Diagnosis not present

## 2018-05-08 LAB — CMP (CANCER CENTER ONLY)
ALT: 11 U/L (ref 0–55)
AST: 18 U/L (ref 5–34)
Albumin: 4.1 g/dL (ref 3.5–5.0)
Alkaline Phosphatase: 65 U/L (ref 40–150)
Anion gap: 7 (ref 3–11)
BILIRUBIN TOTAL: 0.4 mg/dL (ref 0.2–1.2)
BUN: 12 mg/dL (ref 7–26)
CO2: 26 mmol/L (ref 22–29)
CREATININE: 0.74 mg/dL (ref 0.60–1.10)
Calcium: 9.3 mg/dL (ref 8.4–10.4)
Chloride: 105 mmol/L (ref 98–109)
Glucose, Bld: 95 mg/dL (ref 70–140)
Potassium: 3.9 mmol/L (ref 3.5–5.1)
Sodium: 138 mmol/L (ref 136–145)
TOTAL PROTEIN: 6.4 g/dL (ref 6.4–8.3)

## 2018-05-08 LAB — CBC WITH DIFFERENTIAL (CANCER CENTER ONLY)
BASOS ABS: 0 10*3/uL (ref 0.0–0.1)
BASOS PCT: 1 %
EOS ABS: 0 10*3/uL (ref 0.0–0.5)
Eosinophils Relative: 1 %
HCT: 32 % — ABNORMAL LOW (ref 34.8–46.6)
Hemoglobin: 11 g/dL — ABNORMAL LOW (ref 11.6–15.9)
Lymphocytes Relative: 40 %
Lymphs Abs: 1.2 10*3/uL (ref 0.9–3.3)
MCH: 34.2 pg — ABNORMAL HIGH (ref 25.1–34.0)
MCHC: 34.4 g/dL (ref 31.5–36.0)
MCV: 99.3 fL (ref 79.5–101.0)
Monocytes Absolute: 0.3 10*3/uL (ref 0.1–0.9)
Monocytes Relative: 9 %
Neutro Abs: 1.5 10*3/uL (ref 1.5–6.5)
Neutrophils Relative %: 49 %
PLATELETS: 119 10*3/uL — AB (ref 145–400)
RBC: 3.22 MIL/uL — AB (ref 3.70–5.45)
RDW: 14.6 % — ABNORMAL HIGH (ref 11.2–14.5)
WBC: 3 10*3/uL — AB (ref 3.9–10.3)

## 2018-05-08 MED ORDER — FAMOTIDINE IN NACL 20-0.9 MG/50ML-% IV SOLN
20.0000 mg | Freq: Once | INTRAVENOUS | Status: AC
  Administered 2018-05-08: 20 mg via INTRAVENOUS

## 2018-05-08 MED ORDER — DEXAMETHASONE SODIUM PHOSPHATE 10 MG/ML IJ SOLN
INTRAMUSCULAR | Status: AC
Start: 2018-05-08 — End: ?
  Filled 2018-05-08: qty 1

## 2018-05-08 MED ORDER — SODIUM CHLORIDE 0.9 % IV SOLN
300.0000 mg | Freq: Once | INTRAVENOUS | Status: AC
  Administered 2018-05-08: 300 mg via INTRAVENOUS
  Filled 2018-05-08: qty 30

## 2018-05-08 MED ORDER — PALONOSETRON HCL INJECTION 0.25 MG/5ML
INTRAVENOUS | Status: AC
Start: 2018-05-08 — End: ?
  Filled 2018-05-08: qty 5

## 2018-05-08 MED ORDER — FAMOTIDINE IN NACL 20-0.9 MG/50ML-% IV SOLN
INTRAVENOUS | Status: AC
  Filled 2018-05-08: qty 50

## 2018-05-08 MED ORDER — SODIUM CHLORIDE 0.9% FLUSH
10.0000 mL | INTRAVENOUS | Status: DC | PRN
  Administered 2018-05-08: 10 mL
  Filled 2018-05-08: qty 10

## 2018-05-08 MED ORDER — SODIUM CHLORIDE 0.9 % IV SOLN
Freq: Once | INTRAVENOUS | Status: AC
  Administered 2018-05-08: 10:00:00 via INTRAVENOUS

## 2018-05-08 MED ORDER — HEPARIN SOD (PORK) LOCK FLUSH 100 UNIT/ML IV SOLN
500.0000 [IU] | Freq: Once | INTRAVENOUS | Status: AC | PRN
  Administered 2018-05-08: 500 [IU]
  Filled 2018-05-08: qty 5

## 2018-05-08 MED ORDER — SODIUM CHLORIDE 0.9% FLUSH
10.0000 mL | Freq: Once | INTRAVENOUS | Status: AC
  Administered 2018-05-08: 10 mL
  Filled 2018-05-08: qty 10

## 2018-05-08 MED ORDER — DIPHENHYDRAMINE HCL 50 MG/ML IJ SOLN
25.0000 mg | Freq: Once | INTRAMUSCULAR | Status: AC
  Administered 2018-05-08: 25 mg via INTRAVENOUS

## 2018-05-08 MED ORDER — DIPHENHYDRAMINE HCL 50 MG/ML IJ SOLN
INTRAMUSCULAR | Status: AC
  Filled 2018-05-08: qty 1

## 2018-05-08 MED ORDER — DEXAMETHASONE SODIUM PHOSPHATE 10 MG/ML IJ SOLN
10.0000 mg | Freq: Once | INTRAMUSCULAR | Status: AC
  Administered 2018-05-08: 10 mg via INTRAVENOUS

## 2018-05-08 MED ORDER — PALONOSETRON HCL INJECTION 0.25 MG/5ML
0.2500 mg | Freq: Once | INTRAVENOUS | Status: AC
  Administered 2018-05-08: 0.25 mg via INTRAVENOUS

## 2018-05-08 NOTE — Patient Instructions (Signed)
Carboplatin injection What is this medicine? CARBOPLATIN (KAR boe pla tin) is a chemotherapy drug. It targets fast dividing cells, like cancer cells, and causes these cells to die. This medicine is used to treat ovarian cancer and many other cancers. This medicine may be used for other purposes; ask your health care provider or pharmacist if you have questions. COMMON BRAND NAME(S): Paraplatin What should I tell my health care provider before I take this medicine? They need to know if you have any of these conditions: -blood disorders -hearing problems -kidney disease -recent or ongoing radiation therapy -an unusual or allergic reaction to carboplatin, cisplatin, other chemotherapy, other medicines, foods, dyes, or preservatives -pregnant or trying to get pregnant -breast-feeding How should I use this medicine? This drug is usually given as an infusion into a vein. It is administered in a hospital or clinic by a specially trained health care professional. Talk to your pediatrician regarding the use of this medicine in children. Special care may be needed. Overdosage: If you think you have taken too much of this medicine contact a poison control center or emergency room at once. NOTE: This medicine is only for you. Do not share this medicine with others. What if I miss a dose? It is important not to miss a dose. Call your doctor or health care professional if you are unable to keep an appointment. What may interact with this medicine? -medicines for seizures -medicines to increase blood counts like filgrastim, pegfilgrastim, sargramostim -some antibiotics like amikacin, gentamicin, neomycin, streptomycin, tobramycin -vaccines Talk to your doctor or health care professional before taking any of these medicines: -acetaminophen -aspirin -ibuprofen -ketoprofen -naproxen This list may not describe all possible interactions. Give your health care provider a list of all the medicines, herbs,  non-prescription drugs, or dietary supplements you use. Also tell them if you smoke, drink alcohol, or use illegal drugs. Some items may interact with your medicine. What should I watch for while using this medicine? Your condition will be monitored carefully while you are receiving this medicine. You will need important blood work done while you are taking this medicine. This drug may make you feel generally unwell. This is not uncommon, as chemotherapy can affect healthy cells as well as cancer cells. Report any side effects. Continue your course of treatment even though you feel ill unless your doctor tells you to stop. In some cases, you may be given additional medicines to help with side effects. Follow all directions for their use. Call your doctor or health care professional for advice if you get a fever, chills or sore throat, or other symptoms of a cold or flu. Do not treat yourself. This drug decreases your body's ability to fight infections. Try to avoid being around people who are sick. This medicine may increase your risk to bruise or bleed. Call your doctor or health care professional if you notice any unusual bleeding. Be careful brushing and flossing your teeth or using a toothpick because you may get an infection or bleed more easily. If you have any dental work done, tell your dentist you are receiving this medicine. Avoid taking products that contain aspirin, acetaminophen, ibuprofen, naproxen, or ketoprofen unless instructed by your doctor. These medicines may hide a fever. Do not become pregnant while taking this medicine. Women should inform their doctor if they wish to become pregnant or think they might be pregnant. There is a potential for serious side effects to an unborn child. Talk to your health care professional or  pharmacist for more information. Do not breast-feed an infant while taking this medicine. What side effects may I notice from receiving this medicine? Side effects  that you should report to your doctor or health care professional as soon as possible: -allergic reactions like skin rash, itching or hives, swelling of the face, lips, or tongue -signs of infection - fever or chills, cough, sore throat, pain or difficulty passing urine -signs of decreased platelets or bleeding - bruising, pinpoint red spots on the skin, black, tarry stools, nosebleeds -signs of decreased red blood cells - unusually weak or tired, fainting spells, lightheadedness -breathing problems -changes in hearing -changes in vision -chest pain -high blood pressure -low blood counts - This drug may decrease the number of white blood cells, red blood cells and platelets. You may be at increased risk for infections and bleeding. -nausea and vomiting -pain, swelling, redness or irritation at the injection site -pain, tingling, numbness in the hands or feet -problems with balance, talking, walking -trouble passing urine or change in the amount of urine Side effects that usually do not require medical attention (report to your doctor or health care professional if they continue or are bothersome): -hair loss -loss of appetite -metallic taste in the mouth or changes in taste This list may not describe all possible side effects. Call your doctor for medical advice about side effects. You may report side effects to FDA at 1-800-FDA-1088. Where should I keep my medicine? This drug is given in a hospital or clinic and will not be stored at home. NOTE: This sheet is a summary. It may not cover all possible information. If you have questions about this medicine, talk to your doctor, pharmacist, or health care provider.  2018 [employer] (2008-02-20 14:38:05)  

## 2018-05-09 ENCOUNTER — Other Ambulatory Visit: Payer: Self-pay | Admitting: Hematology and Oncology

## 2018-05-09 DIAGNOSIS — C7989 Secondary malignant neoplasm of other specified sites: Secondary | ICD-10-CM

## 2018-05-09 DIAGNOSIS — C561 Malignant neoplasm of right ovary: Secondary | ICD-10-CM

## 2018-05-29 ENCOUNTER — Other Ambulatory Visit (HOSPITAL_COMMUNITY)
Admission: RE | Admit: 2018-05-29 | Discharge: 2018-05-29 | Disposition: A | Discharge status: Home / Self Care | Payer: Medicare Other | Source: Ambulatory Visit | Attending: Hematology and Oncology | Admitting: Hematology and Oncology

## 2018-05-29 ENCOUNTER — Inpatient Hospital Stay (HOSPITAL_BASED_OUTPATIENT_CLINIC_OR_DEPARTMENT_OTHER): Payer: Medicare Other | Admitting: Hematology and Oncology

## 2018-05-29 ENCOUNTER — Inpatient Hospital Stay: Payer: Medicare Other

## 2018-05-29 ENCOUNTER — Encounter: Payer: Self-pay | Admitting: Hematology and Oncology

## 2018-05-29 ENCOUNTER — Telehealth: Payer: Self-pay | Admitting: Hematology and Oncology

## 2018-05-29 ENCOUNTER — Inpatient Hospital Stay: Payer: Medicare Other | Attending: Gynecology

## 2018-05-29 ENCOUNTER — Telehealth: Payer: Self-pay | Admitting: Oncology

## 2018-05-29 VITALS — BP 137/74 | HR 70 | Temp 97.9°F | Resp 18 | Ht 63.0 in | Wt 132.0 lb

## 2018-05-29 DIAGNOSIS — C562 Malignant neoplasm of left ovary: Secondary | ICD-10-CM | POA: Insufficient documentation

## 2018-05-29 DIAGNOSIS — G620 Drug-induced polyneuropathy: Secondary | ICD-10-CM | POA: Diagnosis not present

## 2018-05-29 DIAGNOSIS — Z5111 Encounter for antineoplastic chemotherapy: Secondary | ICD-10-CM | POA: Insufficient documentation

## 2018-05-29 DIAGNOSIS — Z79899 Other long term (current) drug therapy: Secondary | ICD-10-CM | POA: Insufficient documentation

## 2018-05-29 DIAGNOSIS — C786 Secondary malignant neoplasm of retroperitoneum and peritoneum: Secondary | ICD-10-CM | POA: Insufficient documentation

## 2018-05-29 DIAGNOSIS — D61818 Other pancytopenia: Secondary | ICD-10-CM | POA: Insufficient documentation

## 2018-05-29 DIAGNOSIS — Z7189 Other specified counseling: Secondary | ICD-10-CM

## 2018-05-29 DIAGNOSIS — C569 Malignant neoplasm of unspecified ovary: Secondary | ICD-10-CM

## 2018-05-29 DIAGNOSIS — N838 Other noninflammatory disorders of ovary, fallopian tube and broad ligament: Secondary | ICD-10-CM | POA: Diagnosis present

## 2018-05-29 DIAGNOSIS — C7989 Secondary malignant neoplasm of other specified sites: Secondary | ICD-10-CM | POA: Diagnosis not present

## 2018-05-29 DIAGNOSIS — C7889 Secondary malignant neoplasm of other digestive organs: Secondary | ICD-10-CM | POA: Diagnosis not present

## 2018-05-29 DIAGNOSIS — T451X5A Adverse effect of antineoplastic and immunosuppressive drugs, initial encounter: Secondary | ICD-10-CM | POA: Diagnosis not present

## 2018-05-29 DIAGNOSIS — C561 Malignant neoplasm of right ovary: Secondary | ICD-10-CM | POA: Diagnosis not present

## 2018-05-29 DIAGNOSIS — K5909 Other constipation: Secondary | ICD-10-CM | POA: Diagnosis not present

## 2018-05-29 DIAGNOSIS — N939 Abnormal uterine and vaginal bleeding, unspecified: Secondary | ICD-10-CM

## 2018-05-29 LAB — CMP (CANCER CENTER ONLY)
ALT: 15 U/L (ref 0–44)
AST: 18 U/L (ref 15–41)
Albumin: 4 g/dL (ref 3.5–5.0)
Alkaline Phosphatase: 68 U/L (ref 38–126)
Anion gap: 5 (ref 5–15)
BILIRUBIN TOTAL: 0.4 mg/dL (ref 0.3–1.2)
BUN: 13 mg/dL (ref 8–23)
CHLORIDE: 103 mmol/L (ref 98–111)
CO2: 29 mmol/L (ref 22–32)
CREATININE: 0.72 mg/dL (ref 0.44–1.00)
Calcium: 9.5 mg/dL (ref 8.9–10.3)
GFR, Estimated: >60 mL/min (ref >60)
Glucose, Bld: 87 mg/dL (ref 70–99)
Potassium: 4 mmol/L (ref 3.5–5.1)
Sodium: 137 mmol/L (ref 135–145)
TOTAL PROTEIN: 6.1 g/dL — AB (ref 6.5–8.1)

## 2018-05-29 LAB — CBC WITH DIFFERENTIAL (CANCER CENTER ONLY)
Basophils Absolute: 0 10*3/uL (ref 0.0–0.1)
Basophils Relative: 1 %
EOS PCT: 0 %
Eosinophils Absolute: 0 10*3/uL (ref 0.0–0.5)
HCT: 30.6 % — ABNORMAL LOW (ref 34.8–46.6)
Hemoglobin: 10.4 g/dL — ABNORMAL LOW (ref 11.6–15.9)
LYMPHS PCT: 37 %
Lymphs Abs: 1.3 10*3/uL (ref 0.9–3.3)
MCH: 34.6 pg — ABNORMAL HIGH (ref 25.1–34.0)
MCHC: 34.1 g/dL (ref 31.5–36.0)
MCV: 101.6 fL — AB (ref 79.5–101.0)
MONO ABS: 0.4 10*3/uL (ref 0.1–0.9)
Monocytes Relative: 13 %
Neutro Abs: 1.7 10*3/uL (ref 1.5–6.5)
Neutrophils Relative %: 49 %
PLATELETS: 95 10*3/uL — AB (ref 145–400)
RBC: 3.01 MIL/uL — AB (ref 3.70–5.45)
RDW: 17.3 % — AB (ref 11.2–14.5)
WBC Count: 3.4 10*3/uL — ABNORMAL LOW (ref 3.9–10.3)

## 2018-05-29 MED ORDER — SODIUM CHLORIDE 0.9% FLUSH
10.0000 mL | INTRAVENOUS | Status: DC | PRN
  Administered 2018-05-29: 10 mL
  Filled 2018-05-29: qty 10

## 2018-05-29 MED ORDER — HEPARIN SOD (PORK) LOCK FLUSH 100 UNIT/ML IV SOLN
500.0000 [IU] | Freq: Once | INTRAVENOUS | Status: AC | PRN
  Administered 2018-05-29: 500 [IU]
  Filled 2018-05-29: qty 5

## 2018-05-29 MED ORDER — SODIUM CHLORIDE 0.9% FLUSH
10.0000 mL | Freq: Once | INTRAVENOUS | Status: AC
  Administered 2018-05-29: 10 mL
  Filled 2018-05-29: qty 10

## 2018-05-29 MED ORDER — FAMOTIDINE IN NACL 20-0.9 MG/50ML-% IV SOLN
20.0000 mg | Freq: Once | INTRAVENOUS | Status: AC
  Administered 2018-05-29: 20 mg via INTRAVENOUS

## 2018-05-29 MED ORDER — DIPHENHYDRAMINE HCL 50 MG/ML IJ SOLN
INTRAMUSCULAR | Status: AC
Start: 2018-05-29 — End: ?
  Filled 2018-05-29: qty 1

## 2018-05-29 MED ORDER — SODIUM CHLORIDE 0.9 % IV SOLN
300.0000 mg | Freq: Once | INTRAVENOUS | Status: AC
  Administered 2018-05-29: 300 mg via INTRAVENOUS
  Filled 2018-05-29: qty 30

## 2018-05-29 MED ORDER — DEXAMETHASONE SODIUM PHOSPHATE 10 MG/ML IJ SOLN
INTRAMUSCULAR | Status: AC
  Filled 2018-05-29: qty 1

## 2018-05-29 MED ORDER — PALONOSETRON HCL INJECTION 0.25 MG/5ML
INTRAVENOUS | Status: AC
  Filled 2018-05-29: qty 5

## 2018-05-29 MED ORDER — SODIUM CHLORIDE 0.9 % IV SOLN
Freq: Once | INTRAVENOUS | Status: AC
  Administered 2018-05-29: 10:00:00 via INTRAVENOUS

## 2018-05-29 MED ORDER — DEXAMETHASONE SODIUM PHOSPHATE 10 MG/ML IJ SOLN
10.0000 mg | Freq: Once | INTRAMUSCULAR | Status: AC
  Administered 2018-05-29: 10 mg via INTRAVENOUS

## 2018-05-29 MED ORDER — FAMOTIDINE IN NACL 20-0.9 MG/50ML-% IV SOLN
INTRAVENOUS | Status: AC
  Filled 2018-05-29: qty 50

## 2018-05-29 MED ORDER — PALONOSETRON HCL INJECTION 0.25 MG/5ML
0.2500 mg | Freq: Once | INTRAVENOUS | Status: AC
  Administered 2018-05-29: 0.25 mg via INTRAVENOUS

## 2018-05-29 MED ORDER — DIPHENHYDRAMINE HCL 50 MG/ML IJ SOLN
25.0000 mg | Freq: Once | INTRAMUSCULAR | Status: AC
  Administered 2018-05-29: 25 mg via INTRAVENOUS

## 2018-05-29 NOTE — Progress Notes (Signed)
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[facility]) She tolerated treatment well except for pancytopenia She has occasional vaginal discharge/spotting Her recent tumor marker show positive response to treatment I will proceed with treatment with dose adjustment but to delay the next cycle by 1 week due to anticipated worsening pancytopenia I recommend repeat imaging study in August for objective response to therapy  Pancytopenia, acquired [facility]) She has significant pancytopenia despite dose adjustment I will proceed with treatment today with similar dose adjustment but recommend an additional week of break before her next treatment to allow blood count recovery She agree with the plan of care For now, she does not need transfusion  Vaginal spotting She has mild intermittent vaginal spotting of minimum amount For now, I recommend close observation only I plan to repeat imaging study in August as discussed   Orders Placed This Encounter  Procedures  . CT ABDOMEN PELVIS W CONTRAST    Standing Status:   Future    Standing Expiration Date:   05/30/2019    Order Specific Question:   If indicated for the ordered procedure, I authorize the administration of contrast media per Radiology protocol    Answer:   Yes    Order Specific Question:   Preferred imaging location?    Answer:   [facility]    Order Specific Question:   Radiology Contrast Protocol - do NOT remove file path    Answer:   [email or web address]    INTERVAL HISTORY: Please see below for problem oriented charting. 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  Surgical consultation is advised.  No findings to suggest metastatic disease in the abdomen/pelvis      02/13/2013 Pathology Results    1. Adnexa - ovary +/- tube, neoplastic - HIGH GRADE OVARIAN POORLY DIFFERENTIATED CARCINOMA WITH ASSOCIATED TUMOR NECROSIS, 11.5 CM, INVOLVING THE OVARIAN CAPSULE AND ADJACENT FALLOPIAN TUBE. - PLEASE SEE ONCOLOGY TEMPLATE FOR DETAILS. 2. Ovary and fallopian tube, left - HIGH GRADE OVARIAN POORLY DIFFERENTIATED CARCINOMA WITH ASSOCIATED TUMOR NECROSIS, 2.3 CM, INVOLVING THE OVARIAN CAPSULE. - FALLOPIAN TUBAL TISSUE, NO EVIDENCE OF MALIGNANCY. - PLEASE SEE ONCOLOGY TEMPLATE FOR DETAILS. 3. Uterus and cervix - ENDOMETRIUM: BENIGN WEAKLY PROLIFERATIVE ENDOMETRIUM, NO ATYPIA, HYPERPLASIA OR MALIGNANCY. - CERVIX: BENIGN SQUAMOUS MUCOSA AND ENDOCERVICAL MUCOSA, NO DYSPLASIA OR MALIGNANCY. - UTERINE SEROSA: ADHESIONS, NO EVIDENCE OF ENDOMETRIOSIS, ATYPIA OR MALIGNANCY. 4. Cul-de-sac biopsy - POSITIVE FOR METASTATIC CARCINOMA (2.0 CM). 5. Soft tissue mass, simple excision, peri rectal - POSITIVE FOR METASTATIC CARCINOMA (2.6 CM). 6. Lymph nodes, regional resection, right pelvic - SEVEN LYMPH NODES, NEGATIVE FOR METASTATIC  CARCINOMA (0/7). 7. Omentum, resection for tumor - MATURE ADIPOSE TISSUE, NO EVIDENCE OF MALIGNANCY. Microscopic Comment 1. OVARY Specimen(s): Right and left ovaries and fallopian tubes, uterus and  cervix Procedure(s): Total hysterectomy, bilateral salpingo-oophorectomy Lymph node sampling performed: Yes Primary tumor site: Right and left ovaries Ovarian surface involvement: Present Maximum tumor size (cm): Right ovary: 11.5 cm; left ovary: 2.3 cm, gross measurement. 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 please see comment. Grade: High grade Peritoneal implants: No Pelvic extension: Cul-de-sac biopsy and perirectal soft tissue biopsy: positive Peritoneal washings: Rare cluster of atypical cells present ([id number]) Lymph nodes: number examined 7 ; number positive 0 TNM code: pT2c, pN0 FIGO Stage (based on pathologic findings, needs clinical correlation): II B (2014 edition) Comments: Sections of bilateral ovaries show sheets and nests of malignant epithelium resembling transitional cell carcinoma with associated tumor necrosis. No evidence of Brenner's tumor or conventional endometrioid carcinoma is identified in the background. The bilateral ovarian capsules are involved. Immunohistochemical stains were performed and the malignant cells are strongly positive for cytokeratin 7, WT-1, p16 and p53, negative for cytokeratin 20. 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 The tumor involves the right fallopian tube. Both cul-de-sac and perirectal soft tissue biopsies are positive for metastatic carcinoma. The omentum biopsy is negative for tumor.       03/20/2013 - 08/14/2013 Chemotherapy    The patient had 6 cycles of carboplatin and taxol      09/25/2013 Imaging    1. Interval resection of previously described right pelvic mass. No evidence of recurrent or metastatic disease. 2. Small hiatal hernia. 3. Moderate amount of stool within the rectum. Question constipation or even mild fecal impaction.      04/03/2015 PET scan     1. Highly hypermetabolic height bowed dense mass in the medial spleen compatible with malignancy. 2. Bilateral pelvic sidewall adenopathy, right greater than left, hypermetabolic, with small peritoneal implants of tumor in the lower pelvis, and with several faint but suspected omental implants of tumor. There is also a hypermetabolic aortocaval lymph node in the retroperitoneum. 3. Generalized increased thyroid activity could be due to thyroiditis. Although a well-defined nodule is not seen in the right thyroid lobe, there is focal increased hypermetabolic activity in the right thyroid lobe. Thyroid ultrasound is recommended for further investigation. 4. I suspect that the high activity between the spinous processes of L3 and L4 is degenerative.      05/01/2015 - 10/21/2015 Chemotherapy    The patient had 8 cycles of carboplatin, taxol and Avastin.        07/02/2015 Imaging    1. Decrease in size of these splenic lesion. Suspect tumor also involving the adjacent posterior wall of the stomach. 2. Improved pelvic lymphadenopathy. 3. No new omental or peritoneal surface disease.       09/10/2015 Imaging    1. Response to therapy, with further decreased size of a medial splenic lesion. The previously questioned extension into the stomach is no longer identified and may have been artifactual on the prior exam. Of note, an inferior splenic low-density capsular based lesion is felt to be similar and warrants followup attention. 2. No adenopathy or new peritoneal disease identified. 3.  Possible constipation. 4.  Advanced aortic and branch vessel atherosclerosis.      11/06/2015 Imaging    1. Mass arising from the medial spleen and involving the posterior  wall of stomach demonstrates mild decrease in size in the interval. 2. Interval improvement and previously referenced pathologic iliac lymph nodes. 3. Previously referenced peritoneal implants within the pelvis are no longer measurable. No new  areas of peritoneal metastasis is identified. 4. There is a right hilar node which is borderline enlarged but is increased in size when compared with 04/19/2013. Attention on follow-up examination is recommended.      11/18/2015 PET scan    1. Residual hypermetabolic tumor implant between the stomach and spleen, significantly decreased in size and mildly decreased in metabolism compared to 04/03/2015 PET-CT, and slightly decreased in size since 11/06/2015. 2. Residual hypermetabolic right deep pelvic side wall lymph node, with partial treatment response compared to 04/03/2015 PET-CT. 3. No new sites of hypermetabolic metastatic disease. 4. Stable diffuse thyroid hypermetabolism without discrete thyroid nodule, most suggestive of diffuse thyroiditis. Recommend correlation with serum thyroid function tests.       01/28/2016 - 11/26/2016 Anti-estrogen oral therapy    She was placed on Letrozole, stopped due to poor tolerance      02/09/2016 Imaging    1. Continued decrease in size of peritoneal lesion between the posterior wall of stomach and spleen. 2. No new or progressive disease identified      06/23/2016 Imaging    1. Near complete resolution of subtle residual soft tissue density lesion in the gastrosplenic ligament. 2. No new sites of disease identified. 3. Stool burden suggests constipation or even fecal impaction      12/01/2016 Imaging    1. No CT findings for residual or recurrent abdominal/pelvic ovarian cancer. 2. No acute abdominal/pelvic findings. 3. Large amount of stool throughout the colon and down into the rectum suggesting constipation. 4. Stable atherosclerotic calcifications involving the aorta and iliac arteries.      03/29/2017 - 10/29/2017 Anti-estrogen oral therapy    She was on Aromasin      04/19/2017 Imaging    1. Suspicion of peritoneal recurrence with enlarging implants in the gastrosplenic ligament, left adnexa and posterior to the cecum. No generalized  ascites, adenopathy or definite solid organ disease. 2. No evidence of bowel or ureteral obstruction. 3.  Aortic Atherosclerosis (ICD10-I70.0).      06/25/2017 Genetic Testing    Patient has genetic testing done for Breast/ovarian cancer panel Results revealed patient has no detectable mutation      09/01/2017 Tumor Marker    Patient's tumor was tested for the following markers: CA-125 Results of the tumor marker test revealed 40.9      10/29/2017 - 03/24/2018 Anti-estrogen oral therapy    She was on tamoxifen      11/05/2017 Tumor Marker    Patient's tumor was tested for the following markers: CA-125 Results of the tumor marker test revealed 84.4      11/15/2017 Imaging    1. Mild progression of peritoneal metastasis. 2. Possible constipation. No obstruction or other acute complication. 3.  Aortic Atherosclerosis (ICD10-I70.0).      03/21/2018 Tumor Marker    Patient's tumor was tested for the following markers: CA-125 Results of the tumor marker test revealed 101.3.      04/03/2018 Tumor Marker    Patient's tumor was tested for the following markers: CA-125 Results of the tumor marker test revealed 120.9      05/02/2018 Tumor Marker    Patient's tumor was tested for the following markers: CA-125 Results of the tumor marker test revealed 68.2       REVIEW OF SYSTEMS:  Constitutional: Denies fevers, chills or abnormal weight loss Eyes: Denies blurriness of vision Ears, nose, mouth, throat, and face: Denies mucositis or sore throat Respiratory: Denies cough, dyspnea or wheezes Cardiovascular: Denies palpitation, chest discomfort or lower extremity swelling Gastrointestinal:  Denies nausea, heartburn or change in bowel habits Skin: Denies abnormal skin rashes Lymphatics: Denies new lymphadenopathy or easy bruising Neurological:Denies numbness, tingling or new weaknesses Behavioral/Psych: Mood is stable, no new changes  All other systems were reviewed with the patient  and are negative.  I have reviewed the past medical history, past surgical history, social history and family history with the patient and they are unchanged from previous note.  ALLERGIES:  is allergic to clonidine derivatives; coreg [carvedilol]; caffeine; codeine; flexeril [cyclobenzaprine]; lipitor [atorvastatin]; lisinopril; pravastatin; tape; tegaderm ag mesh [silver]; and ultram [tramadol].  MEDICATIONS:  Current Outpatient Medications  Medication Sig Dispense Refill  . acetaminophen (TYLENOL) 500 MG tablet Take 1,000 mg by mouth every 6 (six) hours as needed for mild pain.     Marland Kitchen amiodarone (PACERONE) 200 MG tablet Take 100 mg by mouth daily.  3  . amiodarone (PACERONE) 200 MG tablet TAKE 1/2 TABLET BY MOUTH DAILY 45 tablet 3  . amoxicillin (AMOXIL) 500 MG capsule Take 2,000 mg by mouth See admin instructions. Takes 4 capsules 1 hour prior to procedure    . Biotin 10 MG TABS Take 10 mg by mouth daily.     Marland Kitchen desonide (DESOWEN) 0.05 % cream Apply 1 application topically 2 (two) times daily.   1  . diphenhydrAMINE (BENADRYL) 25 MG tablet Take 50 mg by mouth at bedtime as needed for sleep.     Marland Kitchen docusate sodium (COLACE) 100 MG capsule Take 250 mg by mouth 2 (two) times daily.     Marland Kitchen ELIQUIS 5 MG TABS tablet TAKE 1 TABLET (5 MG TOTAL) BY MOUTH 2 (TWO) TIMES DAILY. 180 tablet 1  . Evolocumab with Infusor 420 MG/3.5ML SOCT Inject into the skin.    Marland Kitchen ezetimibe (ZETIA) 10 MG tablet Take 1 tablet (10 mg total) by mouth daily. (Patient taking differently: Take 10 mg by mouth at bedtime. ) 30 tablet 6  . famotidine (PEPCID) 20 MG tablet Take 20 mg by mouth 2 (two) times daily.    . Glucosamine HCl 1000 MG TABS Take 1,000 mg by mouth daily.    . hydrocortisone 1 % ointment Apply 1 application topically 2 (two) times daily as needed for itching (eczema).     Marland Kitchen ipratropium (ATROVENT) 0.06 % nasal spray Place 2 sprays into both nostrils daily.   5  . irbesartan (AVAPRO) 300 MG tablet TAKE 1 TABLET (300  MG TOTAL) BY MOUTH DAILY. 90 tablet 1  . levothyroxine (SYNTHROID) 88 MCG tablet Take 88 mcg by mouth daily before breakfast.    . lidocaine-prilocaine (EMLA) cream Apply to Porta-Cath site 1-2 hours prior to access as directed. 30 g 1  . magnesium hydroxide (MILK OF MAGNESIA) 800 MG/5ML suspension Take 30 mLs by mouth daily as needed for constipation. Reported on 11/20/2015    . Melatonin 5 MG CAPS Take 5 mg by mouth at bedtime as needed (sleep). Reported on 12/25/2015    . Menthol, Topical Analgesic, (ICY HOT EX) Apply 1 application topically 2 (two) times daily as needed (PAIN).     . Multiple Vitamin (MULTIVITAMIN WITH MINERALS) TABS tablet Take 1 tablet by mouth daily. Centrum Silver    . ondansetron (ZOFRAN) 8 MG tablet Take 1 tablet (8 mg total) by  mouth 2 (two) times daily as needed for refractory nausea / vomiting. Start on day 3 after chemo. 30 tablet 1  . OVER THE COUNTER MEDICATION Co210 20 mg once a day for muscle aches.    Vladimir Faster Glycol-Propyl Glycol (SYSTANE OP) Place 1 drop into both eyes 2 (two) times daily.     . polyethylene glycol (MIRALAX / GLYCOLAX) packet Take 17 g by mouth daily. Mix in 8 oz liquid and drink    . potassium chloride (K-DUR) 10 MEQ tablet Take 10 mEq by mouth every Monday, Wednesday, and Friday.    . prochlorperazine (COMPAZINE) 10 MG tablet Take 1 tablet (10 mg total) by mouth every 6 (six) hours as needed (Nausea or vomiting). 30 tablet 1  . SHINGRIX injection     . spironolactone (ALDACTONE) 25 MG tablet Take 25 mg by mouth See admin instructions. Takes on Tuesdays, Thursday, Saturday, Sunday     No current facility-administered medications for this visit.    Facility-Administered Medications Ordered in Other Visits  Medication Dose Route Frequency Provider Last Rate Last Dose  . sodium chloride flush (NS) 0.9 % injection 10 mL  10 mL Intravenous PRN Cross, Melissa D, NP   10 mL at 01/06/18 1220  . sodium chloride flush (NS) 0.9 % injection 10 mL  10  mL Intracatheter PRN Alvy Bimler, Jenine Krisher, MD   10 mL at 05/29/18 1158    PHYSICAL EXAMINATION: ECOG PERFORMANCE STATUS: 1 - Symptomatic but completely ambulatory  Vitals:   05/29/18 0957  BP: 137/74  Pulse: 70  Resp: 18  Temp: 97.9 F (36.6 C)  SpO2: 100%   Filed Weights   05/29/18 0957  Weight: 132 lb (59.9 kg)    GENERAL:alert, no distress and comfortable SKIN: skin color, texture, turgor are normal, no rashes or significant lesions EYES: normal, Conjunctiva are pink and non-injected, sclera clear OROPHARYNX:no exudate, no erythema and lips, buccal mucosa, and tongue normal  NECK: supple, thyroid normal size, non-tender, without nodularity LYMPH:  no palpable lymphadenopathy in the cervical, axillary or inguinal LUNGS: clear to auscultation and percussion with normal breathing effort HEART: regular rate & rhythm and no murmurs and no lower extremity edema ABDOMEN:abdomen soft, non-tender and normal bowel sounds Musculoskeletal:no cyanosis of digits and no clubbing  NEURO: alert & oriented x 3 with fluent speech, no focal motor/sensory deficits  LABORATORY DATA:  I have reviewed the data as listed    Component Value Date/Time   NA 137 05/29/2018 0851   NA 141 11/11/2017 1145   K 4.0 05/29/2018 0851   K 4.0 11/11/2017 1145   CL 103 05/29/2018 0851   CL 103 05/21/2013 1105   CO2 29 05/29/2018 0851   CO2 29 11/11/2017 1145   GLUCOSE 87 05/29/2018 0851   GLUCOSE 81 11/11/2017 1145   GLUCOSE 74 05/21/2013 1105   BUN 13 05/29/2018 0851   BUN 10.9 11/11/2017 1145   CREATININE 0.72 05/29/2018 0851   CREATININE 0.7 11/11/2017 1145   CALCIUM 9.5 05/29/2018 0851   CALCIUM 9.6 11/11/2017 1145   PROT 6.1 (L) 05/29/2018 0851   PROT 6.5 11/24/2016 1104   ALBUMIN 4.0 05/29/2018 0851   ALBUMIN 3.9 11/24/2016 1104   AST 18 05/29/2018 0851   AST 18 11/24/2016 1104   ALT 15 05/29/2018 0851   ALT 15 11/24/2016 1104   ALKPHOS 68 05/29/2018 0851   ALKPHOS 104 11/24/2016 1104    BILITOT 0.4 05/29/2018 0851   BILITOT 0.57 11/24/2016 1104   GFRNONAA >60  05/29/2018 0851   GFRAA >60 05/29/2018 0851    No results found for: SPEP, UPEP  Lab Results  Component Value Date   WBC 3.4 (L) 05/29/2018   NEUTROABS 1.7 05/29/2018   HGB 10.4 (L) 05/29/2018   HCT 30.6 (L) 05/29/2018   MCV 101.6 (H) 05/29/2018   PLT 95 (L) 05/29/2018      Chemistry      Component Value Date/Time   NA 137 05/29/2018 0851   NA 141 11/11/2017 1145   K 4.0 05/29/2018 0851   K 4.0 11/11/2017 1145   CL 103 05/29/2018 0851   CL 103 05/21/2013 1105   CO2 29 05/29/2018 0851   CO2 29 11/11/2017 1145   BUN 13 05/29/2018 0851   BUN 10.9 11/11/2017 1145   CREATININE 0.72 05/29/2018 0851   CREATININE 0.7 11/11/2017 1145      Component Value Date/Time   CALCIUM 9.5 05/29/2018 0851   CALCIUM 9.6 11/11/2017 1145   ALKPHOS 68 05/29/2018 0851   ALKPHOS 104 11/24/2016 1104   AST 18 05/29/2018 0851   AST 18 11/24/2016 1104   ALT 15 05/29/2018 0851   ALT 15 11/24/2016 1104   BILITOT 0.4 05/29/2018 0851   BILITOT 0.57 11/24/2016 1104      All questions were answered. The patient knows to call the clinic with any problems, questions or concerns. No barriers to learning was detected.  I spent 15 minutes counseling the patient face to face. The total time spent in the appointment was 20 minutes and more than 50% was on counseling and review of test results  Heath Lark, MD 05/29/2018 1:10 PM

## 2018-05-29 NOTE — Assessment & Plan Note (Signed)
She has significant pancytopenia despite dose adjustment I will proceed with treatment today with similar dose adjustment but recommend an additional week of break before her next treatment to allow blood count recovery She agree with the plan of care For now, she does not need transfusion

## 2018-05-29 NOTE — Assessment & Plan Note (Signed)
She has mild intermittent vaginal spotting of minimum amount For now, I recommend close observation only I plan to repeat imaging study in August as discussed

## 2018-05-29 NOTE — Telephone Encounter (Signed)
[name] in [facility] and requested [facility]SI on Accession #: [id number] from 02/13/2013.

## 2018-05-29 NOTE — Assessment & Plan Note (Signed)
She tolerated treatment well except for pancytopenia She has occasional vaginal discharge/spotting Her recent tumor marker show positive response to treatment I will proceed with treatment with dose adjustment but to delay the next cycle by 1 week due to anticipated worsening pancytopenia I recommend repeat imaging study in August for objective response to therapy

## 2018-05-29 NOTE — Telephone Encounter (Signed)
Gave patient avs and calendar of upcoming appointments.  °

## 2018-05-29 NOTE — Patient Instructions (Signed)
[facility] Discharge Instructions for Patients Receiving Chemotherapy  Today you received the following chemotherapy agents: Carboplatin (Paraplatin)  To help prevent nausea and vomiting after your treatment, we encourage you to take your nausea medication as prescribed. You received Aloxi during your treatment today-->Take Compazine (not Zofran) for the next 3 days as needed.   If you develop nausea and vomiting that is not controlled by your nausea medication, call the clinic.   BELOW ARE SYMPTOMS THAT SHOULD BE REPORTED IMMEDIATELY:  *FEVER GREATER THAN 100.5 F  *CHILLS WITH OR WITHOUT FEVER  NAUSEA AND VOMITING THAT IS NOT CONTROLLED WITH YOUR NAUSEA MEDICATION  *UNUSUAL SHORTNESS OF BREATH  *UNUSUAL BRUISING OR BLEEDING  TENDERNESS IN MOUTH AND THROAT WITH OR WITHOUT PRESENCE OF ULCERS  *URINARY PROBLEMS  *BOWEL PROBLEMS  UNUSUAL RASH Items with * indicate a potential emergency and should be followed up as soon as possible.  Feel free to call the clinic should you have any questions or concerns. The clinic phone number is [phone].  Please show the [place] [facility] at check-in to the Emergency Department and triage nurse.

## 2018-05-30 ENCOUNTER — Telehealth: Payer: Self-pay

## 2018-05-30 LAB — CA 125: CANCER ANTIGEN (CA) 125: 41.7 U/mL — AB (ref 0.0–38.1)

## 2018-05-30 NOTE — Telephone Encounter (Signed)
-----   Message from Heath Lark, MD sent at 05/30/2018  8:54 AM EDT ----- Regarding: pls call pt with good result of CA-125   ----- Message ----- From: Interface, Lab In [facility] Sent: 05/29/2018   9:58 AM To: Heath Lark, MD

## 2018-05-30 NOTE — Telephone Encounter (Signed)
Called and given below message. She verbalized understanding. 

## 2018-06-03 ENCOUNTER — Other Ambulatory Visit: Payer: Self-pay | Admitting: Cardiology

## 2018-06-21 ENCOUNTER — Telehealth: Payer: Self-pay | Admitting: Oncology

## 2018-06-21 NOTE — Telephone Encounter (Signed)
Called [name] and notified her that the testing on her tumor from 2014 was negative for BRCA1 or BRCA2 mutations.  She verbalized understanding and agreement.

## 2018-06-26 ENCOUNTER — Inpatient Hospital Stay: Payer: Medicare Other

## 2018-06-26 ENCOUNTER — Inpatient Hospital Stay: Payer: Medicare Other | Admitting: Hematology and Oncology

## 2018-06-26 ENCOUNTER — Encounter: Payer: Self-pay | Admitting: Hematology and Oncology

## 2018-06-26 DIAGNOSIS — C569 Malignant neoplasm of unspecified ovary: Secondary | ICD-10-CM

## 2018-06-26 DIAGNOSIS — Z7189 Other specified counseling: Secondary | ICD-10-CM

## 2018-06-26 DIAGNOSIS — T451X5A Adverse effect of antineoplastic and immunosuppressive drugs, initial encounter: Secondary | ICD-10-CM

## 2018-06-26 DIAGNOSIS — Z5111 Encounter for antineoplastic chemotherapy: Secondary | ICD-10-CM | POA: Diagnosis not present

## 2018-06-26 DIAGNOSIS — G620 Drug-induced polyneuropathy: Secondary | ICD-10-CM

## 2018-06-26 DIAGNOSIS — C7989 Secondary malignant neoplasm of other specified sites: Secondary | ICD-10-CM

## 2018-06-26 DIAGNOSIS — D61818 Other pancytopenia: Secondary | ICD-10-CM

## 2018-06-26 DIAGNOSIS — C561 Malignant neoplasm of right ovary: Secondary | ICD-10-CM

## 2018-06-26 DIAGNOSIS — K5909 Other constipation: Secondary | ICD-10-CM | POA: Diagnosis not present

## 2018-06-26 DIAGNOSIS — Z79899 Other long term (current) drug therapy: Secondary | ICD-10-CM

## 2018-06-26 LAB — CMP (CANCER CENTER ONLY)
ALBUMIN: 4 g/dL (ref 3.5–5.0)
ALT: 13 U/L (ref 0–44)
AST: 20 U/L (ref 15–41)
Alkaline Phosphatase: 73 U/L (ref 38–126)
Anion gap: 6 (ref 5–15)
BILIRUBIN TOTAL: 0.5 mg/dL (ref 0.3–1.2)
BUN: 9 mg/dL (ref 8–23)
CO2: 29 mmol/L (ref 22–32)
CREATININE: 0.71 mg/dL (ref 0.44–1.00)
Calcium: 9.1 mg/dL (ref 8.9–10.3)
Chloride: 103 mmol/L (ref 98–111)
GFR, Est AFR Am: >60 mL/min (ref >60)
GFR, Estimated: >60 mL/min (ref >60)
GLUCOSE: 100 mg/dL — AB (ref 70–99)
POTASSIUM: 3.7 mmol/L (ref 3.5–5.1)
Sodium: 138 mmol/L (ref 135–145)
TOTAL PROTEIN: 6.1 g/dL — AB (ref 6.5–8.1)

## 2018-06-26 LAB — CBC WITH DIFFERENTIAL (CANCER CENTER ONLY)
BASOS PCT: 0 %
Basophils Absolute: 0 10*3/uL (ref 0.0–0.1)
EOS ABS: 0 10*3/uL (ref 0.0–0.5)
Eosinophils Relative: 0 %
HCT: 30.4 % — ABNORMAL LOW (ref 34.8–46.6)
HEMOGLOBIN: 10.4 g/dL — AB (ref 11.6–15.9)
Lymphocytes Relative: 41 %
Lymphs Abs: 1.3 10*3/uL (ref 0.9–3.3)
MCH: 35.6 pg — ABNORMAL HIGH (ref 25.1–34.0)
MCHC: 34.2 g/dL (ref 31.5–36.0)
MCV: 104.1 fL — ABNORMAL HIGH (ref 79.5–101.0)
Monocytes Absolute: 0.4 10*3/uL (ref 0.1–0.9)
Monocytes Relative: 12 %
NEUTROS PCT: 47 %
Neutro Abs: 1.5 10*3/uL (ref 1.5–6.5)
PLATELETS: 91 10*3/uL — AB (ref 145–400)
RBC: 2.92 MIL/uL — ABNORMAL LOW (ref 3.70–5.45)
RDW: 15.7 % — ABNORMAL HIGH (ref 11.2–14.5)
WBC: 3.1 10*3/uL — AB (ref 3.9–10.3)

## 2018-06-26 MED ORDER — SODIUM CHLORIDE 0.9 % IV SOLN
Freq: Once | INTRAVENOUS | Status: AC
  Administered 2018-06-26: 12:00:00 via INTRAVENOUS
  Filled 2018-06-26: qty 250

## 2018-06-26 MED ORDER — FAMOTIDINE IN NACL 20-0.9 MG/50ML-% IV SOLN
INTRAVENOUS | Status: AC
  Filled 2018-06-26: qty 50

## 2018-06-26 MED ORDER — FAMOTIDINE IN NACL 20-0.9 MG/50ML-% IV SOLN
20.0000 mg | Freq: Once | INTRAVENOUS | Status: AC
  Administered 2018-06-26: 20 mg via INTRAVENOUS

## 2018-06-26 MED ORDER — SODIUM CHLORIDE 0.9% FLUSH
10.0000 mL | Freq: Once | INTRAVENOUS | Status: AC
  Administered 2018-06-26: 10 mL
  Filled 2018-06-26: qty 10

## 2018-06-26 MED ORDER — SODIUM CHLORIDE 0.9% FLUSH
10.0000 mL | INTRAVENOUS | Status: DC | PRN
  Administered 2018-06-26: 10 mL
  Filled 2018-06-26: qty 10

## 2018-06-26 MED ORDER — DEXAMETHASONE SODIUM PHOSPHATE 10 MG/ML IJ SOLN
INTRAMUSCULAR | Status: AC
  Filled 2018-06-26: qty 1

## 2018-06-26 MED ORDER — DEXAMETHASONE SODIUM PHOSPHATE 10 MG/ML IJ SOLN
10.0000 mg | Freq: Once | INTRAMUSCULAR | Status: AC
  Administered 2018-06-26: 10 mg via INTRAVENOUS

## 2018-06-26 MED ORDER — PALONOSETRON HCL INJECTION 0.25 MG/5ML
0.2500 mg | Freq: Once | INTRAVENOUS | Status: AC
  Administered 2018-06-26: 0.25 mg via INTRAVENOUS

## 2018-06-26 MED ORDER — HEPARIN SOD (PORK) LOCK FLUSH 100 UNIT/ML IV SOLN
500.0000 [IU] | Freq: Once | INTRAVENOUS | Status: AC | PRN
  Administered 2018-06-26: 500 [IU]
  Filled 2018-06-26: qty 5

## 2018-06-26 MED ORDER — DIPHENHYDRAMINE HCL 50 MG/ML IJ SOLN
25.0000 mg | Freq: Once | INTRAMUSCULAR | Status: AC
  Administered 2018-06-26: 25 mg via INTRAVENOUS

## 2018-06-26 MED ORDER — PALONOSETRON HCL INJECTION 0.25 MG/5ML
INTRAVENOUS | Status: AC
  Filled 2018-06-26: qty 5

## 2018-06-26 MED ORDER — DIPHENHYDRAMINE HCL 50 MG/ML IJ SOLN
INTRAMUSCULAR | Status: AC
  Filled 2018-06-26: qty 1

## 2018-06-26 MED ORDER — CARBOPLATIN CHEMO INJECTION 450 MG/45ML
300.0000 mg | Freq: Once | INTRAVENOUS | Status: AC
  Administered 2018-06-26: 300 mg via INTRAVENOUS
  Filled 2018-06-26: qty 30

## 2018-06-26 NOTE — Progress Notes (Signed)
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[facility]) The patient  has persistent pancytopenia Previous tumor marker show positive response to treatment I will proceed with treatment today with similar dose adjustment I plan repeat CT imaging before next cycle of therapy  Pancytopenia, acquired ([place]) She has significant pancytopenia despite dose adjustment I will proceed with treatment today with similar dose adjustment She agree with the plan of care For now, she does not need transfusion  Other constipation She has stable chronic constipation and bloating She will continue laxative therapy  Chemotherapy-induced peripheral neuropathy ([place]) She denies worsening neuropathy with treatment We will observe for now   No orders of the defined types were placed in this encounter.   INTERVAL HISTORY: Please see below for problem oriented charting. 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  Surgical consultation is advised.  No findings to suggest metastatic disease in the abdomen/pelvis      02/13/2013 Pathology  Results    1. Adnexa - ovary +/- tube, neoplastic - HIGH GRADE OVARIAN POORLY DIFFERENTIATED CARCINOMA WITH ASSOCIATED TUMOR NECROSIS, 11.5 CM, INVOLVING THE OVARIAN CAPSULE AND ADJACENT FALLOPIAN TUBE. - PLEASE SEE ONCOLOGY TEMPLATE FOR DETAILS. 2. Ovary and fallopian tube, left - HIGH GRADE OVARIAN POORLY DIFFERENTIATED CARCINOMA WITH ASSOCIATED TUMOR NECROSIS, 2.3 CM, INVOLVING THE OVARIAN CAPSULE. - FALLOPIAN TUBAL TISSUE, NO EVIDENCE OF MALIGNANCY. - PLEASE SEE ONCOLOGY TEMPLATE FOR DETAILS. 3. Uterus and cervix - ENDOMETRIUM: BENIGN WEAKLY PROLIFERATIVE ENDOMETRIUM, NO ATYPIA, HYPERPLASIA OR MALIGNANCY. - CERVIX: BENIGN SQUAMOUS MUCOSA AND ENDOCERVICAL MUCOSA, NO DYSPLASIA OR MALIGNANCY. - UTERINE SEROSA: ADHESIONS, NO EVIDENCE OF ENDOMETRIOSIS, ATYPIA OR MALIGNANCY. 4. Cul-de-sac biopsy - POSITIVE FOR METASTATIC CARCINOMA (2.0 CM). 5. Soft tissue mass, simple excision, peri rectal - POSITIVE FOR METASTATIC CARCINOMA (2.6 CM). 6. Lymph nodes, regional resection, right pelvic - SEVEN LYMPH NODES, NEGATIVE FOR METASTATIC CARCINOMA (0/7). 7. Omentum, resection for tumor - MATURE ADIPOSE TISSUE, NO EVIDENCE OF MALIGNANCY. Microscopic Comment 1. OVARY Specimen(s): Right and left ovaries and fallopian tubes, uterus and cervix Procedure(s): Total hysterectomy, bilateral salpingo-oophorectomy Lymph node sampling performed: Yes Primary tumor site: Right and left ovaries Ovarian surface involvement: Present Maximum tumor size (cm): Right ovary: 11.5 cm; left ovary: 2.3 cm, gross measurement. 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 please see comment. Grade: High grade Peritoneal implants: No Pelvic extension: Cul-de-sac biopsy and perirectal soft tissue biopsy: positive Peritoneal washings: Rare cluster of atypical cells present ([id number]) Lymph nodes: number examined 7 ; number positive 0 TNM code: pT2c, pN0 FIGO  Stage (based on pathologic findings, needs clinical correlation):  II B (2014 edition) Comments: Sections of bilateral ovaries show sheets and nests of malignant epithelium resembling transitional cell carcinoma with associated tumor necrosis. No evidence of Brenner's tumor or conventional endometrioid carcinoma is identified in the background. The bilateral ovarian capsules are involved. Immunohistochemical stains were performed and the malignant cells are strongly positive for cytokeratin 7, WT-1, p16 and p53, negative for cytokeratin 20. 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 The tumor involves the right fallopian tube. Both cul-de-sac and perirectal soft tissue biopsies are positive for metastatic carcinoma. The omentum biopsy is negative for tumor.       03/20/2013 - 08/14/2013 Chemotherapy    The patient had 6 cycles of carboplatin and taxol      09/25/2013 Imaging    1. Interval resection of previously described right pelvic mass. No evidence of recurrent or metastatic disease. 2. Small hiatal hernia. 3. Moderate amount of stool within the rectum. Question constipation or even mild fecal impaction.      04/03/2015 PET scan    1. Highly hypermetabolic height bowed dense mass in the medial spleen compatible with malignancy. 2. Bilateral pelvic sidewall adenopathy, right greater than left, hypermetabolic, with small peritoneal implants of tumor in the lower pelvis, and with several faint but suspected omental implants of tumor. There is also a hypermetabolic aortocaval lymph node in the retroperitoneum. 3. Generalized increased thyroid activity could be due to thyroiditis. Although a well-defined nodule is not seen in the right thyroid lobe, there is focal increased hypermetabolic activity in the right thyroid lobe. Thyroid ultrasound is recommended for further investigation. 4. I suspect  that the high activity between the spinous processes of L3 and L4 is degenerative.      05/01/2015 - 10/21/2015 Chemotherapy    The patient had 8 cycles of carboplatin, taxol and Avastin.        07/02/2015 Imaging    1. Decrease in size of these splenic lesion. Suspect tumor also involving the adjacent posterior wall of the stomach. 2. Improved pelvic lymphadenopathy. 3. No new omental or peritoneal surface disease.       09/10/2015 Imaging    1. Response to therapy, with further decreased size of a medial splenic lesion. The previously questioned extension into the stomach is no longer identified and may have been artifactual on the prior exam. Of note, an inferior splenic low-density capsular based lesion is felt to be similar and warrants followup attention. 2. No adenopathy or new peritoneal disease identified. 3.  Possible constipation. 4.  Advanced aortic and branch vessel atherosclerosis.      11/06/2015 Imaging    1. Mass arising from the medial spleen and involving the posterior wall of stomach demonstrates mild decrease in size in the interval. 2. Interval improvement and previously referenced pathologic iliac lymph nodes. 3. Previously referenced peritoneal implants within the pelvis are no longer measurable. No new areas of peritoneal metastasis is identified. 4. There is a right hilar node which is borderline enlarged but is increased in size when compared with 04/19/2013. Attention on follow-up examination is recommended.      11/18/2015 PET scan    1. Residual hypermetabolic tumor implant between the stomach and spleen, significantly decreased in size and mildly decreased in metabolism compared to 04/03/2015 PET-CT, and slightly decreased in size since 11/06/2015. 2. Residual hypermetabolic right deep pelvic side wall lymph node, with partial treatment response compared to 04/03/2015 PET-CT. 3. No new  sites of hypermetabolic metastatic disease. 4. Stable diffuse thyroid  hypermetabolism without discrete thyroid nodule, most suggestive of diffuse thyroiditis. Recommend correlation with serum thyroid function tests.       01/28/2016 - 11/26/2016 Anti-estrogen oral therapy    She was placed on Letrozole, stopped due to poor tolerance      02/09/2016 Imaging    1. Continued decrease in size of peritoneal lesion between the posterior wall of stomach and spleen. 2. No new or progressive disease identified      06/23/2016 Imaging    1. Near complete resolution of subtle residual soft tissue density lesion in the gastrosplenic ligament. 2. No new sites of disease identified. 3. Stool burden suggests constipation or even fecal impaction      12/01/2016 Imaging    1. No CT findings for residual or recurrent abdominal/pelvic ovarian cancer. 2. No acute abdominal/pelvic findings. 3. Large amount of stool throughout the colon and down into the rectum suggesting constipation. 4. Stable atherosclerotic calcifications involving the aorta and iliac arteries.      03/29/2017 - 10/29/2017 Anti-estrogen oral therapy    She was on Aromasin      04/19/2017 Imaging    1. Suspicion of peritoneal recurrence with enlarging implants in the gastrosplenic ligament, left adnexa and posterior to the cecum. No generalized ascites, adenopathy or definite solid organ disease. 2. No evidence of bowel or ureteral obstruction. 3.  Aortic Atherosclerosis (ICD10-I70.0).      06/25/2017 Genetic Testing    Patient has genetic testing done for Breast/ovarian cancer panel Results revealed patient has no detectable mutation      09/01/2017 Tumor Marker    Patient's tumor was tested for the following markers: CA-125 Results of the tumor marker test revealed 40.9      10/29/2017 - 03/24/2018 Anti-estrogen oral therapy    She was on tamoxifen      11/05/2017 Tumor Marker    Patient's tumor was tested for the following markers: CA-125 Results of the tumor marker test revealed 84.4       11/15/2017 Imaging    1. Mild progression of peritoneal metastasis. 2. Possible constipation. No obstruction or other acute complication. 3.  Aortic Atherosclerosis (ICD10-I70.0).      03/21/2018 Tumor Marker    Patient's tumor was tested for the following markers: CA-125 Results of the tumor marker test revealed 101.3.      04/03/2018 Tumor Marker    Patient's tumor was tested for the following markers: CA-125 Results of the tumor marker test revealed 120.9      05/02/2018 Tumor Marker    Patient's tumor was tested for the following markers: CA-125 Results of the tumor marker test revealed 68.2      05/29/2018 Tumor Marker    Patient's tumor was tested for the following markers: CA-125 Results of the tumor marker test revealed 41.7       Genetic Testing    Patient has genetic testing done for HRD. Results revealed: negative for deleterious BRCA1 or BRCA2 mutations in tumor.        REVIEW OF SYSTEMS:   Constitutional: Denies fevers, chills or abnormal weight loss Eyes: Denies blurriness of vision Ears, nose, mouth, throat, and face: Denies mucositis or sore throat Respiratory: Denies cough, dyspnea or wheezes Cardiovascular: Denies palpitation, chest discomfort or lower extremity swelling Skin: Denies abnormal skin rashes Lymphatics: Denies new lymphadenopathy or easy bruising Neurological:Denies numbness, tingling or new weaknesses Behavioral/Psych: Mood is stable, no new changes  All other systems were  reviewed with the patient and are negative.  I have reviewed the past medical history, past surgical history, social history and family history with the patient and they are unchanged from previous note.  ALLERGIES:  is allergic to clonidine derivatives; coreg [carvedilol]; caffeine; codeine; flexeril [cyclobenzaprine]; lipitor [atorvastatin]; lisinopril; pravastatin; tape; tegaderm ag mesh [silver]; and ultram [tramadol].  MEDICATIONS:  Current Outpatient Medications   Medication Sig Dispense Refill  . acetaminophen (TYLENOL) 500 MG tablet Take 1,000 mg by mouth every 6 (six) hours as needed for mild pain.     Marland Kitchen amiodarone (PACERONE) 200 MG tablet Take 100 mg by mouth daily.  3  . amiodarone (PACERONE) 200 MG tablet TAKE 1/2 TABLET BY MOUTH DAILY 45 tablet 3  . amoxicillin (AMOXIL) 500 MG capsule Take 2,000 mg by mouth See admin instructions. Takes 4 capsules 1 hour prior to procedure    . Biotin 10 MG TABS Take 20 mg by mouth daily.    Marland Kitchen desonide (DESOWEN) 0.05 % cream Apply 1 application topically 2 (two) times daily.   1  . diphenhydrAMINE (BENADRYL) 25 MG tablet Take 50 mg by mouth at bedtime as needed for sleep.     Marland Kitchen docusate sodium (COLACE) 100 MG capsule Take 250 mg by mouth 2 (two) times daily.     Marland Kitchen ELIQUIS 5 MG TABS tablet TAKE 1 TABLET BY MOUTH TWICE A DAY 180 tablet 1  . Evolocumab with Infusor 420 MG/3.5ML SOCT Inject into the skin.    Marland Kitchen ezetimibe (ZETIA) 10 MG tablet Take 1 tablet (10 mg total) by mouth daily. (Patient taking differently: Take 10 mg by mouth at bedtime. ) 30 tablet 6  . famotidine (PEPCID) 20 MG tablet Take 20 mg by mouth 2 (two) times daily.    . Glucosamine HCl 1000 MG TABS Take 1,000 mg by mouth daily.    . hydrocortisone 1 % ointment Apply 1 application topically 2 (two) times daily as needed for itching (eczema).     Marland Kitchen ipratropium (ATROVENT) 0.06 % nasal spray Place 2 sprays into both nostrils daily.   5  . levothyroxine (SYNTHROID) 88 MCG tablet Take 88 mcg by mouth daily before breakfast.    . lidocaine-prilocaine (EMLA) cream Apply to Porta-Cath site 1-2 hours prior to access as directed. 30 g 1  . magnesium hydroxide (MILK OF MAGNESIA) 800 MG/5ML suspension Take 30 mLs by mouth daily as needed for constipation. Reported on 11/20/2015    . Melatonin 5 MG CAPS Take 5 mg by mouth at bedtime as needed (sleep). Reported on 12/25/2015    . Menthol, Topical Analgesic, (ICY HOT EX) Apply 1 application topically 2 (two) times  daily as needed (PAIN).     . Multiple Vitamin (MULTIVITAMIN WITH MINERALS) TABS tablet Take 1 tablet by mouth daily. Centrum Silver    . ondansetron (ZOFRAN) 8 MG tablet Take 1 tablet (8 mg total) by mouth 2 (two) times daily as needed for refractory nausea / vomiting. Start on day 3 after chemo. 30 tablet 1  . OVER THE COUNTER MEDICATION Co210 20 mg once a day for muscle aches.    Vladimir Faster Glycol-Propyl Glycol (SYSTANE OP) Place 1 drop into both eyes 2 (two) times daily.     . polyethylene glycol (MIRALAX / GLYCOLAX) packet Take 17 g by mouth daily. Mix in 8 oz liquid and drink    . potassium chloride (K-DUR) 10 MEQ tablet Take 10 mEq by mouth daily.    . prochlorperazine (COMPAZINE) 10 MG  tablet Take 1 tablet (10 mg total) by mouth every 6 (six) hours as needed (Nausea or vomiting). 30 tablet 1  . SHINGRIX injection      No current facility-administered medications for this visit.    Facility-Administered Medications Ordered in Other Visits  Medication Dose Route Frequency Provider Last Rate Last Dose  . sodium chloride flush (NS) 0.9 % injection 10 mL  10 mL Intravenous PRN Cross, Melissa D, NP   10 mL at 01/06/18 1220  . sodium chloride flush (NS) 0.9 % injection 10 mL  10 mL Intracatheter PRN Heath Lark, MD   10 mL at 06/26/18 1405    PHYSICAL EXAMINATION: ECOG PERFORMANCE STATUS: 1 - Symptomatic but completely ambulatory  Vitals:   06/26/18 1142  BP: (!) 147/87  Pulse: 67  Resp: 18  Temp: 98.4 F (36.9 C)  SpO2: 100%   Filed Weights   06/26/18 1142  Weight: 133 lb (60.3 kg)    GENERAL:alert, no distress and comfortable SKIN: skin color, texture, turgor are normal, no rashes or significant lesions EYES: normal, Conjunctiva are pink and non-injected, sclera clear OROPHARYNX:no exudate, no erythema and lips, buccal mucosa, and tongue normal  NECK: supple, thyroid normal size, non-tender, without nodularity LYMPH:  no palpable lymphadenopathy in the cervical, axillary  or inguinal LUNGS: clear to auscultation and percussion with normal breathing effort HEART: regular rate & rhythm and no murmurs and no lower extremity edema ABDOMEN:abdomen soft, non-tender and normal bowel sounds Musculoskeletal:no cyanosis of digits and no clubbing  NEURO: alert & oriented x 3 with fluent speech, no focal motor/sensory deficits  LABORATORY DATA:  I have reviewed the data as listed    Component Value Date/Time   NA 138 06/26/2018 1055   NA 141 11/11/2017 1145   K 3.7 06/26/2018 1055   K 4.0 11/11/2017 1145   CL 103 06/26/2018 1055   CL 103 05/21/2013 1105   CO2 29 06/26/2018 1055   CO2 29 11/11/2017 1145   GLUCOSE 100 (H) 06/26/2018 1055   GLUCOSE 81 11/11/2017 1145   GLUCOSE 74 05/21/2013 1105   BUN 9 06/26/2018 1055   BUN 10.9 11/11/2017 1145   CREATININE 0.71 06/26/2018 1055   CREATININE 0.7 11/11/2017 1145   CALCIUM 9.1 06/26/2018 1055   CALCIUM 9.6 11/11/2017 1145   PROT 6.1 (L) 06/26/2018 1055   PROT 6.5 11/24/2016 1104   ALBUMIN 4.0 06/26/2018 1055   ALBUMIN 3.9 11/24/2016 1104   AST 20 06/26/2018 1055   AST 18 11/24/2016 1104   ALT 13 06/26/2018 1055   ALT 15 11/24/2016 1104   ALKPHOS 73 06/26/2018 1055   ALKPHOS 104 11/24/2016 1104   BILITOT 0.5 06/26/2018 1055   BILITOT 0.57 11/24/2016 1104   GFRNONAA >60 06/26/2018 1055   GFRAA >60 06/26/2018 1055    No results found for: SPEP, UPEP  Lab Results  Component Value Date   WBC 3.1 (L) 06/26/2018   NEUTROABS 1.5 06/26/2018   HGB 10.4 (L) 06/26/2018   HCT 30.4 (L) 06/26/2018   MCV 104.1 (H) 06/26/2018   PLT 91 (L) 06/26/2018      Chemistry      Component Value Date/Time   NA 138 06/26/2018 1055   NA 141 11/11/2017 1145   K 3.7 06/26/2018 1055   K 4.0 11/11/2017 1145   CL 103 06/26/2018 1055   CL 103 05/21/2013 1105   CO2 29 06/26/2018 1055   CO2 29 11/11/2017 1145   BUN 9 06/26/2018 1055  BUN 10.9 11/11/2017 1145   CREATININE 0.71 06/26/2018 1055   CREATININE 0.7  11/11/2017 1145      Component Value Date/Time   CALCIUM 9.1 06/26/2018 1055   CALCIUM 9.6 11/11/2017 1145   ALKPHOS 73 06/26/2018 1055   ALKPHOS 104 11/24/2016 1104   AST 20 06/26/2018 1055   AST 18 11/24/2016 1104   ALT 13 06/26/2018 1055   ALT 15 11/24/2016 1104   BILITOT 0.5 06/26/2018 1055   BILITOT 0.57 11/24/2016 1104       All questions were answered. The patient knows to call the clinic with any problems, questions or concerns. No barriers to learning was detected.  I spent 15 minutes counseling the patient face to face. The total time spent in the appointment was 20 minutes and more than 50% was on counseling and review of test results  Heath Lark, MD 06/26/2018 4:18 PM

## 2018-06-26 NOTE — Progress Notes (Signed)
Per tx conditions; okay to proceed with pancytopenia 06/26/18.

## 2018-06-26 NOTE — Assessment & Plan Note (Signed)
The patient  has persistent pancytopenia Previous tumor marker show positive response to treatment I will proceed with treatment today with similar dose adjustment I plan repeat CT imaging before next cycle of therapy

## 2018-06-26 NOTE — Assessment & Plan Note (Signed)
She has significant pancytopenia despite dose adjustment I will proceed with treatment today with similar dose adjustment She agree with the plan of care For now, she does not need transfusion

## 2018-06-26 NOTE — Assessment & Plan Note (Signed)
She has stable chronic constipation and bloating She will continue laxative therapy

## 2018-06-26 NOTE — Assessment & Plan Note (Signed)
She denies worsening neuropathy with treatment We will observe for now

## 2018-06-26 NOTE — Patient Instructions (Signed)
[facility] Discharge Instructions for Patients Receiving Chemotherapy  Today you received the following chemotherapy agents: Carboplatin (Paraplatin).  To help prevent nausea and vomiting after your treatment, we encourage you to take your nausea medication as prescribed. You received Aloxi during your treatment today-->Take Compazine (not Zofran) for the next 3 days as needed.   If you develop nausea and vomiting that is not controlled by your nausea medication, call the clinic.   BELOW ARE SYMPTOMS THAT SHOULD BE REPORTED IMMEDIATELY:  *FEVER GREATER THAN 100.5 F  *CHILLS WITH OR WITHOUT FEVER  NAUSEA AND VOMITING THAT IS NOT CONTROLLED WITH YOUR NAUSEA MEDICATION  *UNUSUAL SHORTNESS OF BREATH  *UNUSUAL BRUISING OR BLEEDING  TENDERNESS IN MOUTH AND THROAT WITH OR WITHOUT PRESENCE OF ULCERS  *URINARY PROBLEMS  *BOWEL PROBLEMS  UNUSUAL RASH Items with * indicate a potential emergency and should be followed up as soon as possible.  Feel free to call the clinic should you have any questions or concerns. The clinic phone number is [phone].  Please show the [place] at check-in to the Emergency Department and triage nurse.

## 2018-06-27 ENCOUNTER — Telehealth: Payer: Self-pay | Admitting: *Deleted

## 2018-06-27 LAB — CA 125: Cancer Antigen (CA) 125: 29.5 U/mL (ref 0.0–38.1)

## 2018-06-27 NOTE — Telephone Encounter (Signed)
Notified of message below

## 2018-06-27 NOTE — Telephone Encounter (Signed)
-----   Message from Heath Lark, MD sent at 06/27/2018  8:14 AM EDT ----- Regarding: CA-125 Please call her and let her know labs are better ----- Message ----- From: Interface, Lab In [place] Sent: 06/26/2018  11:03 AM To: Heath Lark, MD

## 2018-07-04 ENCOUNTER — Encounter (HOSPITAL_COMMUNITY): Payer: Self-pay | Admitting: Hematology and Oncology

## 2018-07-14 ENCOUNTER — Inpatient Hospital Stay: Payer: Medicare Other

## 2018-07-14 ENCOUNTER — Ambulatory Visit (HOSPITAL_COMMUNITY)
Admission: RE | Admit: 2018-07-14 | Discharge: 2018-07-14 | Disposition: A | Discharge status: Home / Self Care | Payer: Medicare Other | Source: Ambulatory Visit | Attending: Hematology and Oncology | Admitting: Hematology and Oncology

## 2018-07-14 ENCOUNTER — Inpatient Hospital Stay: Payer: Medicare Other | Attending: Gynecology

## 2018-07-14 ENCOUNTER — Encounter (HOSPITAL_COMMUNITY): Payer: Self-pay

## 2018-07-14 DIAGNOSIS — C561 Malignant neoplasm of right ovary: Secondary | ICD-10-CM | POA: Diagnosis present

## 2018-07-14 DIAGNOSIS — C569 Malignant neoplasm of unspecified ovary: Secondary | ICD-10-CM | POA: Insufficient documentation

## 2018-07-14 DIAGNOSIS — D61818 Other pancytopenia: Secondary | ICD-10-CM | POA: Insufficient documentation

## 2018-07-14 DIAGNOSIS — R140 Abdominal distension (gaseous): Secondary | ICD-10-CM | POA: Insufficient documentation

## 2018-07-14 DIAGNOSIS — Z79899 Other long term (current) drug therapy: Secondary | ICD-10-CM | POA: Diagnosis not present

## 2018-07-14 DIAGNOSIS — T451X5S Adverse effect of antineoplastic and immunosuppressive drugs, sequela: Secondary | ICD-10-CM | POA: Diagnosis not present

## 2018-07-14 DIAGNOSIS — Z90710 Acquired absence of both cervix and uterus: Secondary | ICD-10-CM | POA: Insufficient documentation

## 2018-07-14 DIAGNOSIS — K5909 Other constipation: Secondary | ICD-10-CM | POA: Diagnosis not present

## 2018-07-14 DIAGNOSIS — Z7189 Other specified counseling: Secondary | ICD-10-CM

## 2018-07-14 DIAGNOSIS — C786 Secondary malignant neoplasm of retroperitoneum and peritoneum: Secondary | ICD-10-CM | POA: Insufficient documentation

## 2018-07-14 DIAGNOSIS — G620 Drug-induced polyneuropathy: Secondary | ICD-10-CM | POA: Diagnosis not present

## 2018-07-14 DIAGNOSIS — Z5111 Encounter for antineoplastic chemotherapy: Secondary | ICD-10-CM | POA: Insufficient documentation

## 2018-07-14 DIAGNOSIS — C7889 Secondary malignant neoplasm of other digestive organs: Secondary | ICD-10-CM | POA: Insufficient documentation

## 2018-07-14 DIAGNOSIS — C7989 Secondary malignant neoplasm of other specified sites: Secondary | ICD-10-CM | POA: Diagnosis present

## 2018-07-14 LAB — CBC WITH DIFFERENTIAL (CANCER CENTER ONLY)
BASOS PCT: 1 %
Basophils Absolute: 0 10*3/uL (ref 0.0–0.1)
EOS ABS: 0 10*3/uL (ref 0.0–0.5)
EOS PCT: 1 %
HCT: 32.2 % — ABNORMAL LOW (ref 34.8–46.6)
Hemoglobin: 10.9 g/dL — ABNORMAL LOW (ref 11.6–15.9)
Lymphocytes Relative: 37 %
Lymphs Abs: 1.1 10*3/uL (ref 0.9–3.3)
MCH: 36.1 pg — AB (ref 25.1–34.0)
MCHC: 33.9 g/dL (ref 31.5–36.0)
MCV: 106.6 fL — ABNORMAL HIGH (ref 79.5–101.0)
MONOS PCT: 16 %
Monocytes Absolute: 0.4 10*3/uL (ref 0.1–0.9)
Neutro Abs: 1.3 10*3/uL — ABNORMAL LOW (ref 1.5–6.5)
Neutrophils Relative %: 45 %
PLATELETS: 103 10*3/uL — AB (ref 145–400)
RBC: 3.02 MIL/uL — AB (ref 3.70–5.45)
RDW: 14.6 % — ABNORMAL HIGH (ref 11.2–14.5)
WBC: 2.8 10*3/uL — AB (ref 3.9–10.3)

## 2018-07-14 LAB — CMP (CANCER CENTER ONLY)
ALK PHOS: 77 U/L (ref 38–126)
ALT: 12 U/L (ref 0–44)
AST: 18 U/L (ref 15–41)
Albumin: 4 g/dL (ref 3.5–5.0)
Anion gap: 8 (ref 5–15)
BUN: 10 mg/dL (ref 8–23)
CALCIUM: 8.9 mg/dL (ref 8.9–10.3)
CO2: 28 mmol/L (ref 22–32)
CREATININE: 0.68 mg/dL (ref 0.44–1.00)
Chloride: 104 mmol/L (ref 98–111)
GFR, Est AFR Am: >60 mL/min (ref >60)
Glucose, Bld: 91 mg/dL (ref 70–99)
Potassium: 3.8 mmol/L (ref 3.5–5.1)
Sodium: 140 mmol/L (ref 135–145)
Total Bilirubin: 0.5 mg/dL (ref 0.3–1.2)
Total Protein: 6.4 g/dL — ABNORMAL LOW (ref 6.5–8.1)

## 2018-07-14 MED ORDER — HEPARIN SOD (PORK) LOCK FLUSH 100 UNIT/ML IV SOLN
500.0000 [IU] | Freq: Once | INTRAVENOUS | Status: AC
  Administered 2018-07-14: 500 [IU]
  Filled 2018-07-14: qty 5

## 2018-07-14 MED ORDER — IOHEXOL 300 MG/ML  SOLN
100.0000 mL | Freq: Once | INTRAMUSCULAR | Status: AC | PRN
  Administered 2018-07-14: 100 mL via INTRAVENOUS

## 2018-07-14 MED ORDER — SODIUM CHLORIDE 0.9% FLUSH
10.0000 mL | Freq: Once | INTRAVENOUS | Status: DC
  Filled 2018-07-14: qty 10

## 2018-07-17 ENCOUNTER — Inpatient Hospital Stay: Payer: Medicare Other | Admitting: Hematology and Oncology

## 2018-07-17 ENCOUNTER — Telehealth: Payer: Self-pay

## 2018-07-17 ENCOUNTER — Telehealth: Payer: Self-pay | Admitting: Hematology and Oncology

## 2018-07-17 ENCOUNTER — Inpatient Hospital Stay: Payer: Medicare Other

## 2018-07-17 ENCOUNTER — Telehealth: Payer: Self-pay | Admitting: Pharmacist

## 2018-07-17 VITALS — BP 129/79 | HR 79 | Temp 97.6°F | Resp 18 | Ht 63.0 in | Wt 133.9 lb

## 2018-07-17 DIAGNOSIS — C569 Malignant neoplasm of unspecified ovary: Secondary | ICD-10-CM

## 2018-07-17 DIAGNOSIS — D61818 Other pancytopenia: Secondary | ICD-10-CM | POA: Diagnosis not present

## 2018-07-17 DIAGNOSIS — Z7189 Other specified counseling: Secondary | ICD-10-CM

## 2018-07-17 DIAGNOSIS — C786 Secondary malignant neoplasm of retroperitoneum and peritoneum: Secondary | ICD-10-CM | POA: Diagnosis not present

## 2018-07-17 DIAGNOSIS — T451X5A Adverse effect of antineoplastic and immunosuppressive drugs, initial encounter: Secondary | ICD-10-CM

## 2018-07-17 DIAGNOSIS — R140 Abdominal distension (gaseous): Secondary | ICD-10-CM

## 2018-07-17 DIAGNOSIS — G620 Drug-induced polyneuropathy: Secondary | ICD-10-CM

## 2018-07-17 DIAGNOSIS — Z5111 Encounter for antineoplastic chemotherapy: Secondary | ICD-10-CM | POA: Diagnosis not present

## 2018-07-17 DIAGNOSIS — Z79899 Other long term (current) drug therapy: Secondary | ICD-10-CM

## 2018-07-17 DIAGNOSIS — Z90710 Acquired absence of both cervix and uterus: Secondary | ICD-10-CM

## 2018-07-17 DIAGNOSIS — K5909 Other constipation: Secondary | ICD-10-CM | POA: Diagnosis not present

## 2018-07-17 DIAGNOSIS — T451X5S Adverse effect of antineoplastic and immunosuppressive drugs, sequela: Secondary | ICD-10-CM

## 2018-07-17 MED ORDER — FAMOTIDINE IN NACL 20-0.9 MG/50ML-% IV SOLN
20.0000 mg | Freq: Once | INTRAVENOUS | Status: AC
  Administered 2018-07-17: 20 mg via INTRAVENOUS

## 2018-07-17 MED ORDER — NIRAPARIB TOSYLATE 100 MG PO CAPS: 200.0000 mg | ORAL_CAPSULE | Freq: Every day | ORAL | 11 refills | Status: DC

## 2018-07-17 MED ORDER — SODIUM CHLORIDE 0.9 % IV SOLN
300.0000 mg | Freq: Once | INTRAVENOUS | Status: AC
  Administered 2018-07-17: 300 mg via INTRAVENOUS
  Filled 2018-07-17: qty 30

## 2018-07-17 MED ORDER — PALONOSETRON HCL INJECTION 0.25 MG/5ML
INTRAVENOUS | Status: AC
  Filled 2018-07-17: qty 5

## 2018-07-17 MED ORDER — SODIUM CHLORIDE 0.9 % IV SOLN
Freq: Once | INTRAVENOUS | Status: AC
  Administered 2018-07-17: 10:00:00 via INTRAVENOUS
  Filled 2018-07-17: qty 250

## 2018-07-17 MED ORDER — DIPHENHYDRAMINE HCL 50 MG/ML IJ SOLN
25.0000 mg | Freq: Once | INTRAMUSCULAR | Status: AC
  Administered 2018-07-17: 25 mg via INTRAVENOUS

## 2018-07-17 MED ORDER — FAMOTIDINE IN NACL 20-0.9 MG/50ML-% IV SOLN
INTRAVENOUS | Status: AC
  Filled 2018-07-17: qty 50

## 2018-07-17 MED ORDER — SODIUM CHLORIDE 0.9% FLUSH
10.0000 mL | INTRAVENOUS | Status: DC | PRN
  Administered 2018-07-17: 10 mL
  Filled 2018-07-17: qty 10

## 2018-07-17 MED ORDER — DIPHENHYDRAMINE HCL 50 MG/ML IJ SOLN
INTRAMUSCULAR | Status: AC
  Filled 2018-07-17: qty 1

## 2018-07-17 MED ORDER — DIPHENHYDRAMINE HCL 25 MG PO CAPS
ORAL_CAPSULE | ORAL | Status: AC
  Filled 2018-07-17: qty 1

## 2018-07-17 MED ORDER — HEPARIN SOD (PORK) LOCK FLUSH 100 UNIT/ML IV SOLN
500.0000 [IU] | Freq: Once | INTRAVENOUS | Status: AC | PRN
  Administered 2018-07-17: 500 [IU]
  Filled 2018-07-17: qty 5

## 2018-07-17 MED ORDER — PALONOSETRON HCL INJECTION 0.25 MG/5ML
0.2500 mg | Freq: Once | INTRAVENOUS | Status: AC
  Administered 2018-07-17: 0.25 mg via INTRAVENOUS

## 2018-07-17 MED ORDER — DEXAMETHASONE SODIUM PHOSPHATE 10 MG/ML IJ SOLN
10.0000 mg | Freq: Once | INTRAMUSCULAR | Status: AC
  Administered 2018-07-17: 10 mg via INTRAVENOUS

## 2018-07-17 MED ORDER — DEXAMETHASONE SODIUM PHOSPHATE 10 MG/ML IJ SOLN
INTRAMUSCULAR | Status: AC
  Filled 2018-07-17: qty 1

## 2018-07-17 NOTE — Telephone Encounter (Signed)
Oral Oncology Patient Advocate Encounter  I was able to secure a [place] for $3500 to cover the copay for [place]. The grant information is as follows and has been shared with [place].  BIN: [id number] Group: [id number] PCN: [id number] ID: [id number] Approved 07/17/18-06/17/19   [place] Patient [place] Phone [phone] Fax [phone]

## 2018-07-17 NOTE — Telephone Encounter (Signed)
Gave avs and calendar ° °

## 2018-07-17 NOTE — Patient Instructions (Signed)
Niraparib capsules What is this medicine? NIRAPARIB (nye RAP a rib) is a chemotherapy drug. It targets specific enzymes within cancer cells and stops the cancer cell from growing. This medicine is used to treat ovarian cancer, fallopian tube cancer, and peritoneal cancer. This medicine may be used for other purposes; ask your health care provider or pharmacist if you have questions. COMMON BRAND NAME(S): ZEJULA What should I tell my health care provider before I take this medicine? They need to know if you have any of these conditions: -heart disease -high blood pressure -history of irregular heartbeat -infection (especially a virus infection such as chickenpox, cold sores, or herpes) -low blood counts, like low white cell, platelet, or red cell counts -an unusual or allergic reaction to niraparib, other medicines, foods, dyes, or preservatives -pregnant or trying to get pregnant -breast-feeding How should I use this medicine? Take this medicine by mouth with a glass of water. Follow the directions on the prescription label. Do not cut, crush or chew this medicine. You can take it with or without food. If it upsets your stomach, take it with food. Take your medicine at regular intervals. Do not take it more often than directed. Do not stop taking except on your doctor's advice. Talk to your pediatrician regarding the use of this medicine in children. Special care may be needed. Overdosage: If you think you have taken too much of this medicine contact a poison control center or emergency room at once. NOTE: This medicine is only for you. Do not share this medicine with others. What if I miss a dose? If you miss a dose, take it as soon as you can. If it is almost time for your next dose, take only that dose. Do not take double or extra doses. What may interact with this medicine? Interactions have not been studied. This list may not describe all possible interactions. Give your health care  provider a list of all the medicines, herbs, non-prescription drugs, or dietary supplements you use. Also tell them if you smoke, drink alcohol, or use illegal drugs. Some items may interact with your medicine. What should I watch for while using this medicine? Tell your doctor or healthcare professional if your symptoms do not start to get better or if they get worse. You may need blood work done while you are taking this medicine. Do not become pregnant while taking this medicine or for 6 months after stopping it. Women should inform their doctor if they wish to become pregnant or think they might be pregnant. There is a potential for serious side effects to an unborn child. Men should inform their doctors if they wish to father a child. This medicine may lower sperm counts. Talk to your health care professional or pharmacist for more information. Do not breast-feed an infant while taking this medicine or for 1 month after stopping it. Avoid taking products that contain aspirin, acetaminophen, ibuprofen, naproxen, or ketoprofen unless instructed by your doctor. These medicines may hide a fever. Be careful brushing and flossing your teeth or using a toothpick because you may get an infection or bleed more easily. If you have any dental work done, tell your dentist you are receiving this medicine. Call your doctor or health care professional for advice if you get a fever, chills or sore throat, or other symptoms of a cold or flu. Do not treat yourself. This drug decreases your body's ability to fight infections. Try to avoid being around people who are sick. This   medicine may increase your risk to bruise or bleed. Call your doctor or health care professional if you notice any unusual bleeding. This drug may make you feel generally unwell. This is not uncommon, as chemotherapy can affect healthy cells as well as cancer cells. Report any side effects. Continue your course of treatment even though you feel ill  unless your doctor tells you to stop. What side effects may I notice from receiving this medicine? Side effects that you should report to your doctor or health care professional as soon as possible: -allergic reactions like skin rash, itching or hives, swelling of the face, lips, or tongue -fast, irregular heartbeat -palpitations -signs of decreased platelets or bleeding - bruising, pinpoint red spots on the skin, black, tarry stools, blood in the urine -signs of decreased red blood cells - unusually weak or tired, feeling faint or lightheaded, falls -signs of infection - fever or chills, cough, sore throat, pain or difficulty passing urine Side effects that usually do not require medical attention (report these to your doctor or health care professional if they continue or are bothersome): -constipation -diarrhea -dizziness -headache -loss of appetite -mouth sores -nausea, vomiting -stomach pain -trouble sleeping This list may not describe all possible side effects. Call your doctor for medical advice about side effects. You may report side effects to FDA at 1-800-FDA-1088. Where should I keep my medicine? Keep out of the reach of children. Store between 20 and 25 degrees C (68 and 77 degrees F). Throw away any unused medicine after the expiration date. NOTE: This sheet is a summary. It may not cover all possible information. If you have questions about this medicine, talk to your doctor, pharmacist, or health care provider.  2018 Elsevier/Gold Standard (2016-04-13 16:40:24)  

## 2018-07-17 NOTE — Telephone Encounter (Signed)
Oral Oncology Patient Advocate Encounter  Received notification from [employer] that prior authorization for Zejula is required.  PA submitted on CoverMyMeds Key [id number] Status is pending  Oral Oncology Clinic will continue to follow.   [place] Patient [place] Phone [phone] Fax [phone]

## 2018-07-17 NOTE — Telephone Encounter (Signed)
Oral Oncology Patient Advocate Encounter  Prior Authorization for Noel Journey has been approved.    PA# [id number] Effective dates: 07/17/18 through 11/28/18 Ph # [phone]  Oral Oncology Clinic will continue to follow.   [place] Patient [place] Phone [phone] Fax [phone]

## 2018-07-17 NOTE — Telephone Encounter (Signed)
Oral Chemotherapy Pharmacist Encounter   I spoke with patient today for overview of: Zejula.   Counseled patient on administration, dosing, side effects, monitoring, drug-food interactions, safe handling, storage, and disposal.  Patient will take Zejula 100mg  capsules, 2 capsules (200mg ) by mouth once daily, with a glass of water, without regard to food. Patient will likely take her Zejula nightly before bed.  Patient understands Zejula dose may be escalated to 300 mg once daily after 2 to 3 months on treatment depending on toleration.  Zejula start date: 08/07/2018   Adverse effects include but are not limited to: nausea, vomiting, diarrhea, taste changes, mouth sores, fatigue, constipation, decreased blood counts, and joint pain.  Patient updated about close blood count monitoring at the initiation of Zejula for detection of thrombocytopenia.  Patient has anti-emetic on hand and knows to take it if nausea develops.   Patient will obtain anti diarrheal and alert the office of 4 or more loose stools above baseline.    Reviewed with patient importance of keeping a medication schedule and plan for any missed doses.  All questions answered. Medication reconciliation performed and medication/allergy list updated.  Insurance authorization for Noel Journey has been obtained. Test claim at the pharmacy revealed copayment approximately $1400 Oral oncology patient advocate was successful in securing copayment grant from the [employer] in an effort to reduce the patient's out-of-pocket cost for Zejula to $0  Noel Journey will ship from the [facility] tomorrow for delivery to patient's home on Wednesday (07/19/2018). Patient informed that the pharmacy will reach out 1 week prior to needing new fill to help arrange continued medication acquisition Patient will wait until Monday, 08/07/2018 prior to starting Zejula.  Mrs. Tatem voiced understanding and appreciation.    Patient knows to call the office with questions or concerns. Oral Oncology Clinic will continue to follow.  Thank you,  Johny Drilling, PharmD, BCPS, BCOP  07/17/2018   2:24 PM Oral Oncology Clinic [phone]

## 2018-07-17 NOTE — Telephone Encounter (Signed)
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 Last carboplatin infusion: 07/17/18  Labs from 07/14/18 assessed Noted platelet count = 103k, wt= 60.7kg, initial dosing of Zejula will be decreased to 200mg  daily Dose may be escalated in 2-3 months to 300mg  daily based on toleration BPs reviewed, WNL, will continue to be monitored  Current medication list in Epic reviewed, no DDIs with Zejula identified.  Prescription has been e-scribed to the [facility] for benefits analysis and approval.  Oral Oncology Clinic will continue to follow for insurance authorization, copayment issues, initial counseling and start date.  Johny Drilling, PharmD, BCPS, BCOP  07/17/2018 1:25 PM Oral Oncology Clinic [phone]

## 2018-07-17 NOTE — Patient Instructions (Signed)
[place] Discharge Instructions for Patients Receiving Chemotherapy  Today you received the following chemotherapy agents: Carboplatin (Paraplatin).  To help prevent nausea and vomiting after your treatment, we encourage you to take your nausea medication as prescribed. You received Aloxi during your treatment today-->Take Compazine (not Zofran) for the next 3 days as needed.   If you develop nausea and vomiting that is not controlled by your nausea medication, call the clinic.   BELOW ARE SYMPTOMS THAT SHOULD BE REPORTED IMMEDIATELY:  *FEVER GREATER THAN 100.5 F  *CHILLS WITH OR WITHOUT FEVER  NAUSEA AND VOMITING THAT IS NOT CONTROLLED WITH YOUR NAUSEA MEDICATION  *UNUSUAL SHORTNESS OF BREATH  *UNUSUAL BRUISING OR BLEEDING  TENDERNESS IN MOUTH AND THROAT WITH OR WITHOUT PRESENCE OF ULCERS  *URINARY PROBLEMS  *BOWEL PROBLEMS  UNUSUAL RASH Items with * indicate a potential emergency and should be followed up as soon as possible.  Feel free to call the clinic should you have any questions or concerns. The clinic phone number is [phone].  Please show the [place] at check-in to the Emergency Department and triage nurse.

## 2018-07-18 ENCOUNTER — Encounter: Payer: Self-pay | Admitting: Hematology and Oncology

## 2018-07-18 MED FILL — ZEJULA 100 MG CAPS: 100 | 30 days supply | Qty: 60 | Fill #0

## 2018-07-18 NOTE — Assessment & Plan Note (Signed)
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 We discussed importance of Advanced Directives and Living will. She has advanced directives and living will at home.  Her husband, Kerry is the medical healthcare power of attorney 

## 2018-07-18 NOTE — Assessment & Plan Note (Signed)
She denies worsening neuropathy with treatment We will observe for now

## 2018-07-18 NOTE — Assessment & Plan Note (Signed)
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

## 2018-07-18 NOTE — Progress Notes (Signed)
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[place]) I have reviewed multiple CT imaging with the patient She has good response to therapy Tumor marker has normalized I recommend we proceed with one last dose of carboplatin and and switch her to PARP maintenance treatment The risk, benefits, side effects of treatment were discussed with the patient and her family and she agreed to proceed I recommend starting her first dose of chemotherapy with neuropathy at reduced dose, 200 mg daily on August 07, 2018 I will see her back within a week afterwards for toxicity review  Pancytopenia, acquired [facility]) She has significant pancytopenia despite dose adjustment I will proceed with treatment today with similar dose adjustment She agree with the plan of care For now, she does not need transfusion I plan to check serum vitamin B12 in her next visit  Chemotherapy-induced peripheral neuropathy ([place]) She denies worsening neuropathy with treatment We will observe for now  Other constipation She has stable chronic constipation and bloating She will continue laxative therapy  Goals of care, counseling/discussion The patient is aware she has incurable disease and treatment is strictly palliative. We discussed importance of Advanced Directives and Living will. She has advanced directives and living will at home.  Her husband, Levada Dy is the medical healthcare power of attorney    Orders Placed This Encounter  Procedures  . Vitamin B12    Standing Status:   Future    Standing Expiration Date:   08/22/2019    INTERVAL HISTORY: Please see below for problem oriented charting. 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  Surgical consultation is advised.  No findings to suggest metastatic disease in the abdomen/pelvis    02/13/2013 Pathology Results    1. Adnexa - ovary +/- tube, neoplastic - HIGH GRADE OVARIAN POORLY DIFFERENTIATED CARCINOMA WITH ASSOCIATED TUMOR NECROSIS, 11.5 CM, INVOLVING THE OVARIAN CAPSULE AND ADJACENT FALLOPIAN TUBE. - PLEASE SEE ONCOLOGY TEMPLATE FOR DETAILS. 2. Ovary and fallopian tube, left - HIGH GRADE OVARIAN POORLY DIFFERENTIATED CARCINOMA WITH ASSOCIATED TUMOR NECROSIS, 2.3 CM, INVOLVING THE OVARIAN CAPSULE. - FALLOPIAN TUBAL TISSUE, NO EVIDENCE OF MALIGNANCY. - PLEASE SEE ONCOLOGY TEMPLATE FOR DETAILS. 3. Uterus and cervix - ENDOMETRIUM: BENIGN WEAKLY PROLIFERATIVE ENDOMETRIUM, NO ATYPIA, HYPERPLASIA OR MALIGNANCY. - CERVIX: BENIGN SQUAMOUS MUCOSA AND ENDOCERVICAL MUCOSA, NO DYSPLASIA OR MALIGNANCY. - UTERINE SEROSA: ADHESIONS, NO EVIDENCE OF ENDOMETRIOSIS, ATYPIA OR MALIGNANCY. 4. Cul-de-sac biopsy - POSITIVE FOR METASTATIC CARCINOMA (2.0 CM). 5. Soft tissue mass, simple excision, peri rectal - POSITIVE FOR METASTATIC CARCINOMA (2.6 CM). 6. Lymph nodes, regional resection, right pelvic - SEVEN LYMPH NODES, NEGATIVE  FOR METASTATIC CARCINOMA (0/7). 7. Omentum, resection for tumor - MATURE ADIPOSE TISSUE, NO EVIDENCE OF  MALIGNANCY. Microscopic Comment 1. OVARY Specimen(s): Right and left ovaries and fallopian tubes, uterus and cervix Procedure(s): Total hysterectomy, bilateral salpingo-oophorectomy Lymph node sampling performed: Yes Primary tumor site: Right and left ovaries Ovarian surface involvement: Present Maximum tumor size (cm): Right ovary: 11.5 cm; left ovary: 2.3 cm, gross measurement. 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 please see comment. Grade: High grade Peritoneal implants: No Pelvic extension: Cul-de-sac biopsy and perirectal soft tissue biopsy: positive Peritoneal washings: Rare cluster of atypical cells present ([id number]) Lymph nodes: number examined 7 ; number positive 0 TNM code: pT2c, pN0 FIGO Stage (based on pathologic findings, needs clinical correlation): II B (2014 edition) Comments: Sections of bilateral ovaries show sheets and nests of malignant epithelium resembling transitional cell carcinoma with associated tumor necrosis. No evidence of Brenner's tumor or conventional endometrioid carcinoma is identified in the background. The bilateral ovarian capsules are involved. Immunohistochemical stains were performed and the malignant cells are strongly positive for cytokeratin 7, WT-1, p16 and p53, negative for cytokeratin 20. 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 The tumor involves the right fallopian tube. Both cul-de-sac and perirectal soft tissue biopsies are positive for metastatic carcinoma. The omentum biopsy is negative for tumor.     03/20/2013 - 08/14/2013 Chemotherapy    The patient had 6 cycles of carboplatin and taxol    09/25/2013 Imaging    1. Interval resection of previously described right pelvic mass. No evidence of recurrent or metastatic disease. 2. Small hiatal hernia. 3. Moderate amount  of stool within the rectum. Question constipation or even mild fecal impaction.    04/03/2015 PET scan    1. Highly hypermetabolic height bowed dense mass in the medial spleen compatible with malignancy. 2. Bilateral pelvic sidewall adenopathy, right greater than left, hypermetabolic, with small peritoneal implants of tumor in the lower pelvis, and with several faint but suspected omental implants of tumor. There is also a hypermetabolic aortocaval lymph node in the retroperitoneum. 3. Generalized increased thyroid activity could be due to thyroiditis. Although a well-defined nodule is not seen in the right thyroid lobe, there is focal increased hypermetabolic activity in the right thyroid lobe. Thyroid ultrasound is recommended for further investigation. 4. I suspect that the high activity between the spinous processes of L3 and L4 is degenerative.    05/01/2015 - 10/21/2015 Chemotherapy    The patient had 8 cycles of carboplatin, taxol and Avastin.      07/02/2015 Imaging    1. Decrease in size of these splenic lesion. Suspect tumor also involving the adjacent posterior wall of the stomach. 2. Improved pelvic lymphadenopathy. 3. No new omental or peritoneal surface disease.     09/10/2015 Imaging    1. Response to therapy, with further decreased size of a medial splenic lesion. The previously questioned extension into the stomach is no longer identified and may have been artifactual on the prior exam. Of note, an inferior splenic low-density capsular based lesion is felt to be similar and warrants followup attention. 2. No adenopathy or new peritoneal disease identified. 3.  Possible constipation. 4.  Advanced aortic and branch vessel atherosclerosis.    11/06/2015 Imaging    1. Mass arising from the medial spleen and involving the posterior wall of stomach demonstrates mild decrease in size in the interval. 2.  Interval improvement and previously referenced pathologic iliac lymph nodes. 3.  Previously referenced peritoneal implants within the pelvis are no longer measurable. No new areas of peritoneal metastasis is identified. 4. There is a right hilar node which is borderline enlarged but is increased in size when compared with 04/19/2013. Attention on follow-up examination is recommended.    11/18/2015 PET scan    1. Residual hypermetabolic tumor implant between the stomach and spleen, significantly decreased in size and mildly decreased in metabolism compared to 04/03/2015 PET-CT, and slightly decreased in size since 11/06/2015. 2. Residual hypermetabolic right deep pelvic side wall lymph node, with partial treatment response compared to 04/03/2015 PET-CT. 3. No new sites of hypermetabolic metastatic disease. 4. Stable diffuse thyroid hypermetabolism without discrete thyroid nodule, most suggestive of diffuse thyroiditis. Recommend correlation with serum thyroid function tests.     01/28/2016 - 11/26/2016 Anti-estrogen oral therapy    She was placed on Letrozole, stopped due to poor tolerance    02/09/2016 Imaging    1. Continued decrease in size of peritoneal lesion between the posterior wall of stomach and spleen. 2. No new or progressive disease identified    06/23/2016 Imaging    1. Near complete resolution of subtle residual soft tissue density lesion in the gastrosplenic ligament. 2. No new sites of disease identified. 3. Stool burden suggests constipation or even fecal impaction    12/01/2016 Imaging    1. No CT findings for residual or recurrent abdominal/pelvic ovarian cancer. 2. No acute abdominal/pelvic findings. 3. Large amount of stool throughout the colon and down into the rectum suggesting constipation. 4. Stable atherosclerotic calcifications involving the aorta and iliac arteries.    03/29/2017 - 10/29/2017 Anti-estrogen oral therapy    She was on Aromasin    04/19/2017 Imaging    1. Suspicion of peritoneal recurrence with enlarging implants in the  gastrosplenic ligament, left adnexa and posterior to the cecum. No generalized ascites, adenopathy or definite solid organ disease. 2. No evidence of bowel or ureteral obstruction. 3.  Aortic Atherosclerosis (ICD10-I70.0).    06/25/2017 Genetic Testing    Patient has genetic testing done for Breast/ovarian cancer panel Results revealed patient has no detectable mutation    09/01/2017 Tumor Marker    Patient's tumor was tested for the following markers: CA-125 Results of the tumor marker test revealed 40.9    10/29/2017 - 03/24/2018 Anti-estrogen oral therapy    She was on tamoxifen    11/05/2017 Tumor Marker    Patient's tumor was tested for the following markers: CA-125 Results of the tumor marker test revealed 84.4    11/15/2017 Imaging    1. Mild progression of peritoneal metastasis. 2. Possible constipation. No obstruction or other acute complication. 3.  Aortic Atherosclerosis (ICD10-I70.0).    03/21/2018 Tumor Marker    Patient's tumor was tested for the following markers: CA-125 Results of the tumor marker test revealed 101.3.    04/03/2018 Tumor Marker    Patient's tumor was tested for the following markers: CA-125 Results of the tumor marker test revealed 120.9    05/02/2018 Tumor Marker    Patient's tumor was tested for the following markers: CA-125 Results of the tumor marker test revealed 68.2    05/29/2018 Tumor Marker    Patient's tumor was tested for the following markers: CA-125 Results of the tumor marker test revealed 41.7     Genetic Testing    Patient has genetic testing done for HRD. Results revealed: negative for deleterious BRCA1 or BRCA2 mutations  in tumor.     06/26/2018 Tumor Marker    Patient's tumor was tested for the following markers: CA-125 Results of the tumor marker test revealed 29.5    07/14/2018 Imaging    Interval decrease in size of bilateral adnexal masses.  No significant change in size of peritoneal metastasis in the left upper  quadrant.  No new or progressive metastatic disease identified.     REVIEW OF SYSTEMS:   Constitutional: Denies fevers, chills or abnormal weight loss Eyes: Denies blurriness of vision Ears, nose, mouth, throat, and face: Denies mucositis or sore throat Respiratory: Denies cough, dyspnea or wheezes Cardiovascular: Denies palpitation, chest discomfort or lower extremity swelling Skin: Denies abnormal skin rashes Lymphatics: Denies new lymphadenopathy or easy bruising Neurological:Denies numbness, tingling or new weaknesses Behavioral/Psych: Mood is stable, no new changes  All other systems were reviewed with the patient and are negative.  I have reviewed the past medical history, past surgical history, social history and family history with the patient and they are unchanged from previous note.  ALLERGIES:  is allergic to clonidine derivatives; coreg [carvedilol]; caffeine; codeine; flexeril [cyclobenzaprine]; lipitor [atorvastatin]; lisinopril; pravastatin; tape; tegaderm ag mesh [silver]; and ultram [tramadol].  MEDICATIONS:  Current Outpatient Medications  Medication Sig Dispense Refill  . acetaminophen (TYLENOL) 500 MG tablet Take 1,000 mg by mouth every 6 (six) hours as needed for mild pain.     Marland Kitchen amiodarone (PACERONE) 200 MG tablet Take 100 mg by mouth daily.  3  . amiodarone (PACERONE) 200 MG tablet TAKE 1/2 TABLET BY MOUTH DAILY 45 tablet 3  . amoxicillin (AMOXIL) 500 MG capsule Take 2,000 mg by mouth See admin instructions. Takes 4 capsules 1 hour prior to procedure    . Biotin 10 MG TABS Take 20 mg by mouth daily.    Marland Kitchen desonide (DESOWEN) 0.05 % cream Apply 1 application topically 2 (two) times daily.   1  . diphenhydrAMINE (BENADRYL) 25 MG tablet Take 50 mg by mouth at bedtime as needed for sleep.     Marland Kitchen docusate sodium (COLACE) 100 MG capsule Take 250 mg by mouth 2 (two) times daily.     Marland Kitchen ELIQUIS 5 MG TABS tablet TAKE 1 TABLET BY MOUTH TWICE A DAY 180 tablet 1  .  Evolocumab with Infusor 420 MG/3.5ML SOCT Inject into the skin.    Marland Kitchen ezetimibe (ZETIA) 10 MG tablet Take 1 tablet (10 mg total) by mouth daily. (Patient taking differently: Take 10 mg by mouth at bedtime. ) 30 tablet 6  . famotidine (PEPCID) 20 MG tablet Take 20 mg by mouth 2 (two) times daily.    . Glucosamine HCl 1000 MG TABS Take 1,000 mg by mouth daily.    . hydrocortisone 1 % ointment Apply 1 application topically 2 (two) times daily as needed for itching (eczema).     Marland Kitchen ipratropium (ATROVENT) 0.06 % nasal spray Place 2 sprays into both nostrils daily.   5  . levothyroxine (SYNTHROID) 88 MCG tablet Take 88 mcg by mouth daily before breakfast.    . lidocaine-prilocaine (EMLA) cream Apply to Porta-Cath site 1-2 hours prior to access as directed. 30 g 1  . magnesium hydroxide (MILK OF MAGNESIA) 800 MG/5ML suspension Take 30 mLs by mouth daily as needed for constipation. Reported on 11/20/2015    . Melatonin 5 MG CAPS Take 5 mg by mouth at bedtime as needed (sleep). Reported on 12/25/2015    . Menthol, Topical Analgesic, (ICY HOT EX) Apply 1 application topically 2 (  two) times daily as needed (PAIN).     . Multiple Vitamin (MULTIVITAMIN WITH MINERALS) TABS tablet Take 1 tablet by mouth daily. Centrum Silver    . Niraparib Tosylate 100 MG CAPS Take 200 mg by mouth daily. 60 capsule 11  . ondansetron (ZOFRAN) 8 MG tablet Take 1 tablet (8 mg total) by mouth 2 (two) times daily as needed for refractory nausea / vomiting. Start on day 3 after chemo. 30 tablet 1  . OVER THE COUNTER MEDICATION Co210 20 mg once a day for muscle aches.    Vladimir Faster Glycol-Propyl Glycol (SYSTANE OP) Place 1 drop into both eyes 2 (two) times daily.     . polyethylene glycol (MIRALAX / GLYCOLAX) packet Take 17 g by mouth daily. Mix in 8 oz liquid and drink    . potassium chloride (K-DUR) 10 MEQ tablet Take 10 mEq by mouth daily.    . prochlorperazine (COMPAZINE) 10 MG tablet Take 1 tablet (10 mg total) by mouth every 6 (six)  hours as needed (Nausea or vomiting). 30 tablet 1  . SHINGRIX injection      No current facility-administered medications for this visit.    Facility-Administered Medications Ordered in Other Visits  Medication Dose Route Frequency Provider Last Rate Last Dose  . sodium chloride flush (NS) 0.9 % injection 10 mL  10 mL Intravenous PRN Joylene John D, NP   10 mL at 01/06/18 1220    PHYSICAL EXAMINATION: ECOG PERFORMANCE STATUS: 2 - Symptomatic, <50% confined to bed  Vitals:   07/17/18 0806  BP: 129/79  Pulse: 79  Resp: 18  Temp: 97.6 F (36.4 C)  SpO2: 99%   Filed Weights   07/17/18 0806  Weight: 133 lb 14.4 oz (60.7 kg)    GENERAL:alert, no distress and comfortable SKIN: skin color, texture, turgor are normal, no rashes or significant lesions EYES: normal, Conjunctiva are pink and non-injected, sclera clear OROPHARYNX:no exudate, no erythema and lips, buccal mucosa, and tongue normal  NECK: supple, thyroid normal size, non-tender, without nodularity LYMPH:  no palpable lymphadenopathy in the cervical, axillary or inguinal LUNGS: clear to auscultation and percussion with normal breathing effort HEART: regular rate & rhythm and no murmurs and no lower extremity edema ABDOMEN:abdomen soft, non-tender and normal bowel sounds Musculoskeletal:no cyanosis of digits and no clubbing  NEURO: alert & oriented x 3 with fluent speech, no focal motor/sensory deficits  LABORATORY DATA:  I have reviewed the data as listed    Component Value Date/Time   NA 140 07/14/2018 0901   NA 141 11/11/2017 1145   K 3.8 07/14/2018 0901   K 4.0 11/11/2017 1145   CL 104 07/14/2018 0901   CL 103 05/21/2013 1105   CO2 28 07/14/2018 0901   CO2 29 11/11/2017 1145   GLUCOSE 91 07/14/2018 0901   GLUCOSE 81 11/11/2017 1145   GLUCOSE 74 05/21/2013 1105   BUN 10 07/14/2018 0901   BUN 10.9 11/11/2017 1145   CREATININE 0.68 07/14/2018 0901   CREATININE 0.7 11/11/2017 1145   CALCIUM 8.9 07/14/2018  0901   CALCIUM 9.6 11/11/2017 1145   PROT 6.4 (L) 07/14/2018 0901   PROT 6.5 11/24/2016 1104   ALBUMIN 4.0 07/14/2018 0901   ALBUMIN 3.9 11/24/2016 1104   AST 18 07/14/2018 0901   AST 18 11/24/2016 1104   ALT 12 07/14/2018 0901   ALT 15 11/24/2016 1104   ALKPHOS 77 07/14/2018 0901   ALKPHOS 104 11/24/2016 1104   BILITOT 0.5 07/14/2018 0901  BILITOT 0.57 11/24/2016 1104   GFRNONAA >60 07/14/2018 0901   GFRAA >60 07/14/2018 0901    No results found for: SPEP, UPEP  Lab Results  Component Value Date   WBC 2.8 (L) 07/14/2018   NEUTROABS 1.3 (L) 07/14/2018   HGB 10.9 (L) 07/14/2018   HCT 32.2 (L) 07/14/2018   MCV 106.6 (H) 07/14/2018   PLT 103 (L) 07/14/2018      Chemistry      Component Value Date/Time   NA 140 07/14/2018 0901   NA 141 11/11/2017 1145   K 3.8 07/14/2018 0901   K 4.0 11/11/2017 1145   CL 104 07/14/2018 0901   CL 103 05/21/2013 1105   CO2 28 07/14/2018 0901   CO2 29 11/11/2017 1145   BUN 10 07/14/2018 0901   BUN 10.9 11/11/2017 1145   CREATININE 0.68 07/14/2018 0901   CREATININE 0.7 11/11/2017 1145      Component Value Date/Time   CALCIUM 8.9 07/14/2018 0901   CALCIUM 9.6 11/11/2017 1145   ALKPHOS 77 07/14/2018 0901   ALKPHOS 104 11/24/2016 1104   AST 18 07/14/2018 0901   AST 18 11/24/2016 1104   ALT 12 07/14/2018 0901   ALT 15 11/24/2016 1104   BILITOT 0.5 07/14/2018 0901   BILITOT 0.57 11/24/2016 1104       RADIOGRAPHIC STUDIES: I have reviewed multiple imaging studies with the patient I have personally reviewed the radiological images as listed and agreed with the findings in the report. Ct Abdomen Pelvis W Contrast  Result Date: 07/14/2018 CLINICAL DATA:  Follow-up metastatic ovarian carcinoma. Ongoing chemotherapy. EXAM: CT ABDOMEN AND PELVIS WITH CONTRAST TECHNIQUE: Multidetector CT imaging of the abdomen and pelvis was performed using the standard protocol following bolus administration of intravenous contrast. CONTRAST:  153m  OMNIPAQUE IOHEXOL 300 MG/ML  SOLN COMPARISON:  03/30/2018 FINDINGS: Lower Chest: No acute findings. Hepatobiliary: No hepatic masses identified. Gallbladder is unremarkable. Pancreas:  No mass or inflammatory changes. Spleen: Soft tissue mass involving the medial aspect of the spleen, gastrosplenic ligament and adjacent stomach measures 4.3 x 3.3 cm, and is not significantly changed compared to previous study when it measured 3.8 x 3.7 cm. Adrenals/Urinary Tract: No masses identified. No evidence of hydronephrosis. Stomach/Bowel: No evidence of obstruction, inflammatory process or abnormal fluid collections. Vascular/Lymphatic: No pathologically enlarged lymph nodes. No abdominal aortic aneurysm. Reproductive: Prior hysterectomy. Soft tissue mass in the left adnexal region has decreased in size, currently measuring 3.2 x 2.7 cm on image 59/2 compared to 4.8 x 3.0 cm previously. Soft tissue mass in the right adnexa has also decreased in size, currently measuring 2.5 x 1.6 cm on image 58/2, compared to 2.8 x 2.4 cm previously. No new or enlarging soft tissue masses are identified. No evidence of ascites. Other:  None. Musculoskeletal: No suspicious bone lesions identified. Right hip prosthesis again noted. IMPRESSION: Interval decrease in size of bilateral adnexal masses. No significant change in size of peritoneal metastasis in the left upper quadrant. No new or progressive metastatic disease identified. Electronically Signed   By: JEarle GellM.D.   On: 07/14/2018 12:47    All questions were answered. The patient knows to call the clinic with any problems, questions or concerns. No barriers to learning was detected.  I spent 30 minutes counseling the patient face to face. The total time spent in the appointment was 40 minutes and more than 50% was on counseling and review of test results  NHeath Lark MD 07/18/2018 11:29 AM

## 2018-07-18 NOTE — Assessment & Plan Note (Signed)
She has stable chronic constipation and bloating She will continue laxative therapy

## 2018-07-18 NOTE — Assessment & Plan Note (Addendum)
She has significant pancytopenia despite dose adjustment I will proceed with treatment today with similar dose adjustment She agree with the plan of care For now, she does not need transfusion I plan to check serum vitamin B12 in her next visit

## 2018-07-19 NOTE — Telephone Encounter (Signed)
Oral Oncology Patient Advocate Encounter  Confirmed with [place] that Noel Journey was shipped to the patient 07/18/18.  [place] Patient [place] Phone [phone] Fax [phone]

## 2018-08-01 ENCOUNTER — Other Ambulatory Visit: Payer: Self-pay | Admitting: Hematology and Oncology

## 2018-08-01 DIAGNOSIS — C569 Malignant neoplasm of unspecified ovary: Secondary | ICD-10-CM

## 2018-08-01 DIAGNOSIS — Z7189 Other specified counseling: Secondary | ICD-10-CM

## 2018-08-01 NOTE — Telephone Encounter (Signed)
I recommend she starts next Monday 9/9 if possible

## 2018-08-01 NOTE — Telephone Encounter (Addendum)
Oral Oncology Patient Advocate Encounter  I was able to secure a second grant with [facility] [place] (PAF). The grant information is as follows and has been shared with [place].   BIN: [id number] Group: [id number] PCN: [id number] ID: [id number]  Approved 07/28/18-07/29/19  I spoke to the patient to make her aware of this and she verbalized understanding and great appreciation.  [place] Patient [place] Phone [phone] Fax [phone]

## 2018-08-14 ENCOUNTER — Inpatient Hospital Stay: Payer: Medicare Other

## 2018-08-14 ENCOUNTER — Inpatient Hospital Stay: Payer: Medicare Other | Admitting: Hematology and Oncology

## 2018-08-14 ENCOUNTER — Inpatient Hospital Stay: Payer: Medicare Other | Attending: Gynecology

## 2018-08-14 ENCOUNTER — Telehealth: Payer: Self-pay | Admitting: Hematology and Oncology

## 2018-08-14 VITALS — BP 153/83 | HR 80 | Temp 98.0°F | Resp 17 | Ht 63.0 in | Wt 131.6 lb

## 2018-08-14 DIAGNOSIS — C786 Secondary malignant neoplasm of retroperitoneum and peritoneum: Secondary | ICD-10-CM | POA: Insufficient documentation

## 2018-08-14 DIAGNOSIS — Z79899 Other long term (current) drug therapy: Secondary | ICD-10-CM | POA: Diagnosis not present

## 2018-08-14 DIAGNOSIS — R110 Nausea: Secondary | ICD-10-CM

## 2018-08-14 DIAGNOSIS — Z23 Encounter for immunization: Secondary | ICD-10-CM | POA: Diagnosis not present

## 2018-08-14 DIAGNOSIS — C569 Malignant neoplasm of unspecified ovary: Secondary | ICD-10-CM

## 2018-08-14 DIAGNOSIS — Z0000 Encounter for general adult medical examination without abnormal findings: Secondary | ICD-10-CM

## 2018-08-14 DIAGNOSIS — T451X5A Adverse effect of antineoplastic and immunosuppressive drugs, initial encounter: Secondary | ICD-10-CM | POA: Diagnosis not present

## 2018-08-14 DIAGNOSIS — C562 Malignant neoplasm of left ovary: Secondary | ICD-10-CM | POA: Diagnosis present

## 2018-08-14 DIAGNOSIS — D61818 Other pancytopenia: Secondary | ICD-10-CM

## 2018-08-14 DIAGNOSIS — C561 Malignant neoplasm of right ovary: Secondary | ICD-10-CM

## 2018-08-14 DIAGNOSIS — Z90710 Acquired absence of both cervix and uterus: Secondary | ICD-10-CM

## 2018-08-14 DIAGNOSIS — Z9221 Personal history of antineoplastic chemotherapy: Secondary | ICD-10-CM | POA: Insufficient documentation

## 2018-08-14 DIAGNOSIS — D61810 Antineoplastic chemotherapy induced pancytopenia: Secondary | ICD-10-CM

## 2018-08-14 DIAGNOSIS — C7989 Secondary malignant neoplasm of other specified sites: Secondary | ICD-10-CM

## 2018-08-14 DIAGNOSIS — Z7189 Other specified counseling: Secondary | ICD-10-CM

## 2018-08-14 LAB — CMP (CANCER CENTER ONLY)
ALT: 10 U/L (ref 0–44)
AST: 21 U/L (ref 15–41)
Albumin: 4.1 g/dL (ref 3.5–5.0)
Alkaline Phosphatase: 82 U/L (ref 38–126)
Anion gap: 8 (ref 5–15)
BUN: 10 mg/dL (ref 8–23)
CO2: 29 mmol/L (ref 22–32)
Calcium: 9.6 mg/dL (ref 8.9–10.3)
Chloride: 102 mmol/L (ref 98–111)
Creatinine: 0.82 mg/dL (ref 0.44–1.00)
GFR, Est AFR Am: >60 mL/min (ref >60)
GFR, Estimated: >60 mL/min (ref >60)
Glucose, Bld: 95 mg/dL (ref 70–99)
POTASSIUM: 3.9 mmol/L (ref 3.5–5.1)
Sodium: 139 mmol/L (ref 135–145)
Total Bilirubin: 0.5 mg/dL (ref 0.3–1.2)
Total Protein: 6.5 g/dL (ref 6.5–8.1)

## 2018-08-14 LAB — CBC WITH DIFFERENTIAL (CANCER CENTER ONLY)
Basophils Absolute: 0 10*3/uL (ref 0.0–0.1)
Basophils Relative: 0 %
Eosinophils Absolute: 0 10*3/uL (ref 0.0–0.5)
Eosinophils Relative: 0 %
HCT: 32.3 % — ABNORMAL LOW (ref 34.8–46.6)
HEMOGLOBIN: 11 g/dL — AB (ref 11.6–15.9)
LYMPHS ABS: 1.4 10*3/uL (ref 0.9–3.3)
Lymphocytes Relative: 41 %
MCH: 36.3 pg — AB (ref 25.1–34.0)
MCHC: 34.1 g/dL (ref 31.5–36.0)
MCV: 106.6 fL — ABNORMAL HIGH (ref 79.5–101.0)
MONO ABS: 0.3 10*3/uL (ref 0.1–0.9)
MONOS PCT: 10 %
NEUTROS PCT: 49 %
Neutro Abs: 1.6 10*3/uL (ref 1.5–6.5)
Platelet Count: 99 10*3/uL — ABNORMAL LOW (ref 145–400)
RBC: 3.03 MIL/uL — ABNORMAL LOW (ref 3.70–5.45)
RDW: 13.8 % (ref 11.2–14.5)
WBC Count: 3.3 10*3/uL — ABNORMAL LOW (ref 3.9–10.3)

## 2018-08-14 LAB — VITAMIN B12: Vitamin B-12: 461 pg/mL (ref 180–914)

## 2018-08-14 MED ORDER — HEPARIN SOD (PORK) LOCK FLUSH 100 UNIT/ML IV SOLN
500.0000 [IU] | Freq: Once | INTRAVENOUS | Status: AC
  Administered 2018-08-14: 500 [IU]
  Filled 2018-08-14: qty 5

## 2018-08-14 MED ORDER — INFLUENZA VAC SPLIT QUAD 0.5 ML IM SUSY: 0.5000 mL | PREFILLED_SYRINGE | Freq: Once | INTRAMUSCULAR | Status: DC

## 2018-08-14 MED ORDER — SODIUM CHLORIDE 0.9% FLUSH
10.0000 mL | Freq: Once | INTRAVENOUS | Status: AC
  Administered 2018-08-14: 10 mL
  Filled 2018-08-14: qty 10

## 2018-08-14 MED ORDER — INFLUENZA VAC SPLIT HIGH-DOSE 0.5 ML IM SUSY
0.5000 mL | PREFILLED_SYRINGE | Freq: Once | INTRAMUSCULAR | Status: AC
  Administered 2018-08-14: 0.5 mL via INTRAMUSCULAR
  Filled 2018-08-14: qty 0.5

## 2018-08-14 NOTE — Telephone Encounter (Signed)
Gave avs and calendar ° °

## 2018-08-15 ENCOUNTER — Encounter: Payer: Self-pay | Admitting: Hematology and Oncology

## 2018-08-15 DIAGNOSIS — R110 Nausea: Secondary | ICD-10-CM | POA: Insufficient documentation

## 2018-08-15 DIAGNOSIS — T451X5A Adverse effect of antineoplastic and immunosuppressive drugs, initial encounter: Secondary | ICD-10-CM | POA: Insufficient documentation

## 2018-08-15 LAB — CA 125: CANCER ANTIGEN (CA) 125: 31.7 U/mL (ref 0.0–38.1)

## 2018-08-15 NOTE — Assessment & Plan Note (Signed)
She had recent mild nausea I recommend her to use antiemetics as needed.

## 2018-08-15 NOTE — Progress Notes (Signed)
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 She does not have any prior allergic reactions to influenza vaccination. She agrees to proceed with influenza vaccination today and we will administer it today at the clinic.   Pancytopenia, acquired [facility]) She has significant pancytopenia I recommend reducing the dose of niraparib and repeat blood work next week.  We discussed neutropenic precaution  Chemotherapy-induced nausea She had recent mild nausea I recommend her to use antiemetics as needed.   No orders of the defined types were placed in this encounter.   INTERVAL HISTORY: Please see below for problem oriented charting. 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  Surgical consultation is advised.  No findings to suggest metastatic disease in the abdomen/pelvis    02/13/2013 Pathology Results   1. Adnexa - ovary +/- tube, neoplastic - HIGH GRADE OVARIAN POORLY DIFFERENTIATED CARCINOMA WITH ASSOCIATED TUMOR NECROSIS, 11.5 CM, INVOLVING THE OVARIAN CAPSULE AND ADJACENT FALLOPIAN TUBE. - PLEASE SEE ONCOLOGY TEMPLATE FOR DETAILS. 2. Ovary and fallopian tube, left - HIGH GRADE OVARIAN POORLY DIFFERENTIATED CARCINOMA WITH ASSOCIATED TUMOR NECROSIS, 2.3 CM, INVOLVING THE OVARIAN CAPSULE. - FALLOPIAN TUBAL TISSUE, NO EVIDENCE OF MALIGNANCY. - PLEASE SEE ONCOLOGY TEMPLATE FOR DETAILS. 3. Uterus and cervix - ENDOMETRIUM: BENIGN WEAKLY PROLIFERATIVE ENDOMETRIUM, NO ATYPIA, HYPERPLASIA OR MALIGNANCY. - CERVIX: BENIGN SQUAMOUS MUCOSA AND ENDOCERVICAL MUCOSA, NO DYSPLASIA OR MALIGNANCY. - UTERINE SEROSA: ADHESIONS, NO EVIDENCE OF ENDOMETRIOSIS, ATYPIA OR MALIGNANCY. 4. Cul-de-sac biopsy - POSITIVE FOR METASTATIC CARCINOMA (2.0 CM). 5. Soft tissue mass, simple excision, peri rectal - POSITIVE FOR METASTATIC CARCINOMA (2.6 CM). 6. Lymph nodes, regional resection, right pelvic - SEVEN LYMPH NODES, NEGATIVE FOR METASTATIC CARCINOMA (0/7). 7. Omentum, resection for tumor - MATURE ADIPOSE TISSUE, NO EVIDENCE OF MALIGNANCY. Microscopic Comment 1. OVARY Specimen(s): Right and left ovaries and fallopian tubes, uterus and cervix Procedure(s): Total hysterectomy, bilateral salpingo-oophorectomy Lymph node sampling performed: Yes Primary tumor site: Right and left ovaries Ovarian surface involvement: Present Maximum tumor size (cm): Right ovary: 11.5 cm; left ovary: 2.3 cm, gross measurement. 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 please see comment. Grade: High grade Peritoneal implants: No Pelvic extension: Cul-de-sac biopsy and perirectal soft tissue biopsy: positive Peritoneal washings: Rare cluster of atypical cells present ([id number]) Lymph nodes: number examined 7 ; number positive 0 TNM code: pT2c, pN0 FIGO Stage (based  on pathologic findings, needs clinical correlation): II B (  2014 edition) Comments: Sections of bilateral ovaries show sheets and nests of malignant epithelium resembling transitional cell carcinoma with associated tumor necrosis. No evidence of Brenner's tumor or conventional endometrioid carcinoma is identified in the background. The bilateral ovarian capsules are involved. Immunohistochemical stains were performed and the malignant cells are strongly positive for cytokeratin 7, WT-1, p16 and p53, negative for cytokeratin 20. 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 The tumor involves the right fallopian tube. Both cul-de-sac and perirectal soft tissue biopsies are positive for metastatic carcinoma. The omentum biopsy is negative for tumor.     03/20/2013 - 08/14/2013 Chemotherapy    The patient had 6 cycles of carboplatin and taxol    09/25/2013 Imaging    1. Interval resection of previously described right pelvic mass. No evidence of recurrent or metastatic disease. 2. Small hiatal hernia. 3. Moderate amount of stool within the rectum. Question constipation or even mild fecal impaction.    04/03/2015 PET scan    1. Highly hypermetabolic height bowed dense mass in the medial spleen compatible with malignancy. 2. Bilateral pelvic sidewall adenopathy, right greater than left, hypermetabolic, with small peritoneal implants of tumor in the lower pelvis, and with several faint but suspected omental implants of tumor. There is also a hypermetabolic aortocaval lymph node in the retroperitoneum. 3. Generalized increased thyroid activity could be due to thyroiditis. Although a well-defined nodule is not seen in the right thyroid lobe, there is focal increased hypermetabolic activity in the right thyroid lobe. Thyroid ultrasound is recommended for further investigation. 4. I suspect that the high activity  between the spinous processes of L3 and L4 is degenerative.    05/01/2015 - 10/21/2015 Chemotherapy    The patient had 8 cycles of carboplatin, taxol and Avastin.      07/02/2015 Imaging    1. Decrease in size of these splenic lesion. Suspect tumor also involving the adjacent posterior wall of the stomach. 2. Improved pelvic lymphadenopathy. 3. No new omental or peritoneal surface disease.     09/10/2015 Imaging    1. Response to therapy, with further decreased size of a medial splenic lesion. The previously questioned extension into the stomach is no longer identified and may have been artifactual on the prior exam. Of note, an inferior splenic low-density capsular based lesion is felt to be similar and warrants followup attention. 2. No adenopathy or new peritoneal disease identified. 3.  Possible constipation. 4.  Advanced aortic and branch vessel atherosclerosis.    11/06/2015 Imaging    1. Mass arising from the medial spleen and involving the posterior wall of stomach demonstrates mild decrease in size in the interval. 2. Interval improvement and previously referenced pathologic iliac lymph nodes. 3. Previously referenced peritoneal implants within the pelvis are no longer measurable. No new areas of peritoneal metastasis is identified. 4. There is a right hilar node which is borderline enlarged but is increased in size when compared with 04/19/2013. Attention on follow-up examination is recommended.    11/18/2015 PET scan    1. Residual hypermetabolic tumor implant between the stomach and spleen, significantly decreased in size and mildly decreased in metabolism compared to 04/03/2015 PET-CT, and slightly decreased in size since 11/06/2015. 2. Residual hypermetabolic right deep pelvic side wall lymph node, with partial treatment response compared to 04/03/2015 PET-CT. 3. No new sites of hypermetabolic metastatic disease. 4. Stable diffuse thyroid hypermetabolism without discrete thyroid  nodule, most suggestive of  diffuse thyroiditis. Recommend correlation with serum thyroid function tests.     01/28/2016 - 11/26/2016 Anti-estrogen oral therapy    She was placed on Letrozole, stopped due to poor tolerance    02/09/2016 Imaging    1. Continued decrease in size of peritoneal lesion between the posterior wall of stomach and spleen. 2. No new or progressive disease identified    06/23/2016 Imaging    1. Near complete resolution of subtle residual soft tissue density lesion in the gastrosplenic ligament. 2. No new sites of disease identified. 3. Stool burden suggests constipation or even fecal impaction    12/01/2016 Imaging    1. No CT findings for residual or recurrent abdominal/pelvic ovarian cancer. 2. No acute abdominal/pelvic findings. 3. Large amount of stool throughout the colon and down into the rectum suggesting constipation. 4. Stable atherosclerotic calcifications involving the aorta and iliac arteries.    03/29/2017 - 10/29/2017 Anti-estrogen oral therapy    She was on Aromasin    04/19/2017 Imaging    1. Suspicion of peritoneal recurrence with enlarging implants in the gastrosplenic ligament, left adnexa and posterior to the cecum. No generalized ascites, adenopathy or definite solid organ disease. 2. No evidence of bowel or ureteral obstruction. 3.  Aortic Atherosclerosis (ICD10-I70.0).    06/25/2017 Genetic Testing    Patient has genetic testing done for Breast/ovarian cancer panel Results revealed patient has no detectable mutation    09/01/2017 Tumor Marker    Patient's tumor was tested for the following markers: CA-125 Results of the tumor marker test revealed 40.9    10/29/2017 - 03/24/2018 Anti-estrogen oral therapy    She was on tamoxifen    11/05/2017 Tumor Marker    Patient's tumor was tested for the following markers: CA-125 Results of the tumor marker test revealed 84.4    11/15/2017 Imaging    1. Mild progression of peritoneal metastasis. 2.  Possible constipation. No obstruction or other acute complication. 3.  Aortic Atherosclerosis (ICD10-I70.0).    03/21/2018 Tumor Marker    Patient's tumor was tested for the following markers: CA-125 Results of the tumor marker test revealed 101.3.    04/03/2018 Tumor Marker    Patient's tumor was tested for the following markers: CA-125 Results of the tumor marker test revealed 120.9    05/02/2018 Tumor Marker    Patient's tumor was tested for the following markers: CA-125 Results of the tumor marker test revealed 68.2    05/29/2018 Tumor Marker    Patient's tumor was tested for the following markers: CA-125 Results of the tumor marker test revealed 41.7     Genetic Testing    Patient has genetic testing done for HRD. Results revealed: negative for deleterious BRCA1 or BRCA2 mutations in tumor.     06/26/2018 Tumor Marker    Patient's tumor was tested for the following markers: CA-125 Results of the tumor marker test revealed 29.5    07/14/2018 Imaging    Interval decrease in size of bilateral adnexal masses.  No significant change in size of peritoneal metastasis in the left upper quadrant.  No new or progressive metastatic disease identified.    08/07/2018 -  Chemotherapy    The patient had zejula    08/14/2018 Tumor Marker    Patient's tumor was tested for the following markers: CA-125 Results of the tumor marker test revealed 31.7     REVIEW OF SYSTEMS:   Constitutional: Denies fevers, chills or abnormal weight loss Eyes: Denies blurriness of vision Ears, nose, mouth, throat,  and face: Denies mucositis or sore throat Respiratory: Denies cough, dyspnea or wheezes Cardiovascular: Denies palpitation, chest discomfort or lower extremity swelling Gastrointestinal:  Denies nausea, heartburn or change in bowel habits Skin: Denies abnormal skin rashes Neurological:Denies numbness, tingling or new weaknesses Behavioral/Psych: Mood is stable, no new changes  All other systems  were reviewed with the patient and are negative.  I have reviewed the past medical history, past surgical history, social history and family history with the patient and they are unchanged from previous note.  ALLERGIES:  is allergic to clonidine derivatives; coreg [carvedilol]; caffeine; codeine; flexeril [cyclobenzaprine]; lipitor [atorvastatin]; lisinopril; pravastatin; tape; tegaderm ag mesh [silver]; and ultram [tramadol].  MEDICATIONS:  Current Outpatient Medications  Medication Sig Dispense Refill  . acetaminophen (TYLENOL) 500 MG tablet Take 1,000 mg by mouth every 6 (six) hours as needed for mild pain.     Marland Kitchen amiodarone (PACERONE) 200 MG tablet Take 100 mg by mouth daily.  3  . amiodarone (PACERONE) 200 MG tablet TAKE 1/2 TABLET BY MOUTH DAILY 45 tablet 3  . amoxicillin (AMOXIL) 500 MG capsule Take 2,000 mg by mouth See admin instructions. Takes 4 capsules 1 hour prior to procedure    . Biotin 10 MG TABS Take 20 mg by mouth daily.    Marland Kitchen desonide (DESOWEN) 0.05 % cream Apply 1 application topically 2 (two) times daily.   1  . diphenhydrAMINE (BENADRYL) 25 MG tablet Take 50 mg by mouth at bedtime as needed for sleep.     Marland Kitchen docusate sodium (COLACE) 100 MG capsule Take 250 mg by mouth 2 (two) times daily.     Marland Kitchen ELIQUIS 5 MG TABS tablet TAKE 1 TABLET BY MOUTH TWICE A DAY 180 tablet 1  . Evolocumab with Infusor 420 MG/3.5ML SOCT Inject into the skin.    Marland Kitchen ezetimibe (ZETIA) 10 MG tablet Take 1 tablet (10 mg total) by mouth daily. (Patient taking differently: Take 10 mg by mouth at bedtime. ) 30 tablet 6  . famotidine (PEPCID) 20 MG tablet Take 20 mg by mouth 2 (two) times daily.    . Glucosamine HCl 1000 MG TABS Take 1,000 mg by mouth daily.    . hydrocortisone 1 % ointment Apply 1 application topically 2 (two) times daily as needed for itching (eczema).     Marland Kitchen ipratropium (ATROVENT) 0.06 % nasal spray Place 2 sprays into both nostrils daily.   5  . levothyroxine (SYNTHROID) 88 MCG tablet Take  88 mcg by mouth daily before breakfast.    . lidocaine-prilocaine (EMLA) cream Apply to Porta-Cath site 1-2 hours prior to access as directed. 30 g 1  . magnesium hydroxide (MILK OF MAGNESIA) 800 MG/5ML suspension Take 30 mLs by mouth daily as needed for constipation. Reported on 11/20/2015    . Melatonin 5 MG CAPS Take 5 mg by mouth at bedtime as needed (sleep). Reported on 12/25/2015    . Menthol, Topical Analgesic, (ICY HOT EX) Apply 1 application topically 2 (two) times daily as needed (PAIN).     . Multiple Vitamin (MULTIVITAMIN WITH MINERALS) TABS tablet Take 1 tablet by mouth daily. Centrum Silver    . Niraparib Tosylate 100 MG CAPS Take 200 mg by mouth daily. 60 capsule 11  . ondansetron (ZOFRAN) 8 MG tablet Take 1 tablet (8 mg total) by mouth every 8 (eight) hours as needed for nausea or vomiting. 90 tablet 1  . OVER THE COUNTER MEDICATION Co210 20 mg once a day for muscle aches.    Marland Kitchen  Polyethyl Glycol-Propyl Glycol (SYSTANE OP) Place 1 drop into both eyes 2 (two) times daily.     . polyethylene glycol (MIRALAX / GLYCOLAX) packet Take 17 g by mouth daily. Mix in 8 oz liquid and drink    . potassium chloride (K-DUR) 10 MEQ tablet Take 10 mEq by mouth daily.    . prochlorperazine (COMPAZINE) 10 MG tablet Take 1 tablet (10 mg total) by mouth every 6 (six) hours as needed (Nausea or vomiting). 30 tablet 1  . SHINGRIX injection      No current facility-administered medications for this visit.    Facility-Administered Medications Ordered in Other Visits  Medication Dose Route Frequency Provider Last Rate Last Dose  . sodium chloride flush (NS) 0.9 % injection 10 mL  10 mL Intravenous PRN Joylene John D, NP   10 mL at 01/06/18 1220    PHYSICAL EXAMINATION: ECOG PERFORMANCE STATUS: 1 - Symptomatic but completely ambulatory  Vitals:   08/14/18 1139  BP: (!) 153/83  Pulse: 80  Resp: 17  Temp: 98 F (36.7 C)  SpO2: 100%   Filed Weights   08/14/18 1139  Weight: 131 lb 9.6 oz (59.7  kg)    GENERAL:alert, no distress and comfortable SKIN: skin color, texture, turgor are normal, no rashes or significant lesions EYES: normal, Conjunctiva are pink and non-injected, sclera clear OROPHARYNX:no exudate, no erythema and lips, buccal mucosa, and tongue normal  NECK: supple, thyroid normal size, non-tender, without nodularity LYMPH:  no palpable lymphadenopathy in the cervical, axillary or inguinal LUNGS: clear to auscultation and percussion with normal breathing effort HEART: regular rate & rhythm and no murmurs and no lower extremity edema ABDOMEN:abdomen soft, non-tender and normal bowel sounds Musculoskeletal:no cyanosis of digits and no clubbing  NEURO: alert & oriented x 3 with fluent speech, no focal motor/sensory deficits  LABORATORY DATA:  I have reviewed the data as listed    Component Value Date/Time   NA 139 08/14/2018 1042   NA 141 11/11/2017 1145   K 3.9 08/14/2018 1042   K 4.0 11/11/2017 1145   CL 102 08/14/2018 1042   CL 103 05/21/2013 1105   CO2 29 08/14/2018 1042   CO2 29 11/11/2017 1145   GLUCOSE 95 08/14/2018 1042   GLUCOSE 81 11/11/2017 1145   GLUCOSE 74 05/21/2013 1105   BUN 10 08/14/2018 1042   BUN 10.9 11/11/2017 1145   CREATININE 0.82 08/14/2018 1042   CREATININE 0.7 11/11/2017 1145   CALCIUM 9.6 08/14/2018 1042   CALCIUM 9.6 11/11/2017 1145   PROT 6.5 08/14/2018 1042   PROT 6.5 11/24/2016 1104   ALBUMIN 4.1 08/14/2018 1042   ALBUMIN 3.9 11/24/2016 1104   AST 21 08/14/2018 1042   AST 18 11/24/2016 1104   ALT 10 08/14/2018 1042   ALT 15 11/24/2016 1104   ALKPHOS 82 08/14/2018 1042   ALKPHOS 104 11/24/2016 1104   BILITOT 0.5 08/14/2018 1042   BILITOT 0.57 11/24/2016 1104   GFRNONAA >60 08/14/2018 1042   GFRAA >60 08/14/2018 1042    No results found for: SPEP, UPEP  Lab Results  Component Value Date   WBC 3.3 (L) 08/14/2018   NEUTROABS 1.6 08/14/2018   HGB 11.0 (L) 08/14/2018   HCT 32.3 (L) 08/14/2018   MCV 106.6 (H)  08/14/2018   PLT 99 (L) 08/14/2018      Chemistry      Component Value Date/Time   NA 139 08/14/2018 1042   NA 141 11/11/2017 1145   K 3.9 08/14/2018 1042  K 4.0 11/11/2017 1145   CL 102 08/14/2018 1042   CL 103 05/21/2013 1105   CO2 29 08/14/2018 1042   CO2 29 11/11/2017 1145   BUN 10 08/14/2018 1042   BUN 10.9 11/11/2017 1145   CREATININE 0.82 08/14/2018 1042   CREATININE 0.7 11/11/2017 1145      Component Value Date/Time   CALCIUM 9.6 08/14/2018 1042   CALCIUM 9.6 11/11/2017 1145   ALKPHOS 82 08/14/2018 1042   ALKPHOS 104 11/24/2016 1104   AST 21 08/14/2018 1042   AST 18 11/24/2016 1104   ALT 10 08/14/2018 1042   ALT 15 11/24/2016 1104   BILITOT 0.5 08/14/2018 1042   BILITOT 0.57 11/24/2016 1104     All questions were answered. The patient knows to call the clinic with any problems, questions or concerns. No barriers to learning was detected.  I spent 15 minutes counseling the patient face to face. The total time spent in the appointment was 20 minutes and more than 50% was on counseling and review of test results  Heath Lark, MD 08/15/2018 5:38 PM

## 2018-08-15 NOTE — Assessment & Plan Note (Signed)
She tolerated treatment well except for pancytopenia I recommend reducing the dose of niraparib to 100 mg and recheck blood work weekly I recommend minimum 3 months of treatment before repeat imaging study We discussed the importance of preventive care and reviewed the vaccination programs. She does not have any prior allergic reactions to influenza vaccination. She agrees to proceed with influenza vaccination today and we will administer it today at the clinic.

## 2018-08-15 NOTE — Assessment & Plan Note (Signed)
She has significant pancytopenia I recommend reducing the dose of niraparib and repeat blood work next week.  We discussed neutropenic precaution

## 2018-08-21 ENCOUNTER — Inpatient Hospital Stay: Payer: Medicare Other

## 2018-08-21 ENCOUNTER — Telehealth: Payer: Self-pay

## 2018-08-21 DIAGNOSIS — C561 Malignant neoplasm of right ovary: Secondary | ICD-10-CM | POA: Diagnosis not present

## 2018-08-21 DIAGNOSIS — Z7189 Other specified counseling: Secondary | ICD-10-CM

## 2018-08-21 DIAGNOSIS — C569 Malignant neoplasm of unspecified ovary: Secondary | ICD-10-CM

## 2018-08-21 LAB — CMP (CANCER CENTER ONLY)
ALBUMIN: 4.2 g/dL (ref 3.5–5.0)
ALT: 12 U/L (ref 0–44)
ANION GAP: 8 (ref 5–15)
AST: 22 U/L (ref 15–41)
Alkaline Phosphatase: 93 U/L (ref 38–126)
BILIRUBIN TOTAL: 0.4 mg/dL (ref 0.3–1.2)
BUN: 14 mg/dL (ref 8–23)
CHLORIDE: 101 mmol/L (ref 98–111)
CO2: 31 mmol/L (ref 22–32)
Calcium: 9.6 mg/dL (ref 8.9–10.3)
Creatinine: 0.99 mg/dL (ref 0.44–1.00)
GFR, EST NON AFRICAN AMERICAN: 54 mL/min — AB (ref >60)
GFR, Est AFR Am: >60 mL/min (ref >60)
Glucose, Bld: 103 mg/dL — ABNORMAL HIGH (ref 70–99)
Potassium: 3.8 mmol/L (ref 3.5–5.1)
Sodium: 140 mmol/L (ref 135–145)
TOTAL PROTEIN: 6.6 g/dL (ref 6.5–8.1)

## 2018-08-21 LAB — CBC WITH DIFFERENTIAL (CANCER CENTER ONLY)
BASOS ABS: 0 10*3/uL (ref 0.0–0.1)
Basophils Relative: 1 %
EOS PCT: 0 %
Eosinophils Absolute: 0 10*3/uL (ref 0.0–0.5)
HEMATOCRIT: 31.7 % — AB (ref 34.8–46.6)
HEMOGLOBIN: 10.9 g/dL — AB (ref 11.6–15.9)
LYMPHS ABS: 1.3 10*3/uL (ref 0.9–3.3)
LYMPHS PCT: 38 %
MCH: 37.1 pg — AB (ref 25.1–34.0)
MCHC: 34.4 g/dL (ref 31.5–36.0)
MCV: 107.7 fL — AB (ref 79.5–101.0)
Monocytes Absolute: 0.3 10*3/uL (ref 0.1–0.9)
Monocytes Relative: 10 %
Neutro Abs: 1.7 10*3/uL (ref 1.5–6.5)
Neutrophils Relative %: 51 %
PLATELETS: 120 10*3/uL — AB (ref 145–400)
RBC: 2.95 MIL/uL — AB (ref 3.70–5.45)
RDW: 14.5 % (ref 11.2–14.5)
WBC: 3.4 10*3/uL — AB (ref 3.9–10.3)

## 2018-08-21 NOTE — Telephone Encounter (Signed)
Attempted to contact pt to give her lab results.  Obtained VM that did not identify pt as recipient therefore no vm was left.

## 2018-08-28 ENCOUNTER — Inpatient Hospital Stay: Payer: Medicare Other

## 2018-08-28 ENCOUNTER — Telehealth: Payer: Self-pay | Admitting: Hematology and Oncology

## 2018-08-28 ENCOUNTER — Inpatient Hospital Stay (HOSPITAL_BASED_OUTPATIENT_CLINIC_OR_DEPARTMENT_OTHER): Payer: Medicare Other | Admitting: Hematology and Oncology

## 2018-08-28 DIAGNOSIS — C786 Secondary malignant neoplasm of retroperitoneum and peritoneum: Secondary | ICD-10-CM

## 2018-08-28 DIAGNOSIS — T451X5A Adverse effect of antineoplastic and immunosuppressive drugs, initial encounter: Secondary | ICD-10-CM

## 2018-08-28 DIAGNOSIS — D61810 Antineoplastic chemotherapy induced pancytopenia: Secondary | ICD-10-CM

## 2018-08-28 DIAGNOSIS — C561 Malignant neoplasm of right ovary: Secondary | ICD-10-CM | POA: Diagnosis not present

## 2018-08-28 DIAGNOSIS — C569 Malignant neoplasm of unspecified ovary: Secondary | ICD-10-CM

## 2018-08-28 DIAGNOSIS — C562 Malignant neoplasm of left ovary: Secondary | ICD-10-CM | POA: Diagnosis not present

## 2018-08-28 DIAGNOSIS — R110 Nausea: Secondary | ICD-10-CM

## 2018-08-28 DIAGNOSIS — Z7189 Other specified counseling: Secondary | ICD-10-CM

## 2018-08-28 DIAGNOSIS — Z9221 Personal history of antineoplastic chemotherapy: Secondary | ICD-10-CM

## 2018-08-28 DIAGNOSIS — Z79899 Other long term (current) drug therapy: Secondary | ICD-10-CM

## 2018-08-28 DIAGNOSIS — Z90710 Acquired absence of both cervix and uterus: Secondary | ICD-10-CM

## 2018-08-28 DIAGNOSIS — D61818 Other pancytopenia: Secondary | ICD-10-CM

## 2018-08-28 LAB — CMP (CANCER CENTER ONLY)
ALK PHOS: 90 U/L (ref 38–126)
ALT: 13 U/L (ref 0–44)
AST: 21 U/L (ref 15–41)
Albumin: 4.1 g/dL (ref 3.5–5.0)
Anion gap: 7 (ref 5–15)
BILIRUBIN TOTAL: 0.4 mg/dL (ref 0.3–1.2)
BUN: 11 mg/dL (ref 8–23)
CO2: 31 mmol/L (ref 22–32)
CREATININE: 0.88 mg/dL (ref 0.44–1.00)
Calcium: 9.4 mg/dL (ref 8.9–10.3)
Chloride: 102 mmol/L (ref 98–111)
GFR, Est AFR Am: >60 mL/min (ref >60)
Glucose, Bld: 107 mg/dL — ABNORMAL HIGH (ref 70–99)
POTASSIUM: 4 mmol/L (ref 3.5–5.1)
Sodium: 140 mmol/L (ref 135–145)
TOTAL PROTEIN: 6.4 g/dL — AB (ref 6.5–8.1)

## 2018-08-28 LAB — CBC WITH DIFFERENTIAL (CANCER CENTER ONLY)
BASOS ABS: 0 10*3/uL (ref 0.0–0.1)
BASOS PCT: 1 %
EOS ABS: 0 10*3/uL (ref 0.0–0.5)
Eosinophils Relative: 0 %
HEMATOCRIT: 31.6 % — AB (ref 34.8–46.6)
HEMOGLOBIN: 10.9 g/dL — AB (ref 11.6–15.9)
Lymphocytes Relative: 31 %
Lymphs Abs: 0.9 10*3/uL (ref 0.9–3.3)
MCH: 37.3 pg — ABNORMAL HIGH (ref 25.1–34.0)
MCHC: 34.6 g/dL (ref 31.5–36.0)
MCV: 107.9 fL — ABNORMAL HIGH (ref 79.5–101.0)
MONOS PCT: 13 %
Monocytes Absolute: 0.4 10*3/uL (ref 0.1–0.9)
NEUTROS ABS: 1.6 10*3/uL (ref 1.5–6.5)
NEUTROS PCT: 55 %
Platelet Count: 178 10*3/uL (ref 145–400)
RBC: 2.93 MIL/uL — ABNORMAL LOW (ref 3.70–5.45)
RDW: 14.9 % — ABNORMAL HIGH (ref 11.2–14.5)
WBC: 2.9 10*3/uL — AB (ref 3.9–10.3)

## 2018-08-28 NOTE — Telephone Encounter (Signed)
Gave patient avs and calendar.  Patient wanted labs drawn only from arm on the 7th.

## 2018-08-29 ENCOUNTER — Encounter: Payer: Self-pay | Admitting: Hematology and Oncology

## 2018-08-29 NOTE — Assessment & Plan Note (Addendum)
She tolerated treatment poorly Despite recovery of her thrombocytopenia and stable anemia, she has excessive fatigue She has no bleeding complications or infectious complications She will continue her treatment at 200 mg daily and I plan to check her blood count weekly.

## 2018-08-29 NOTE — Assessment & Plan Note (Signed)
She tolerated chemotherapy poorly.  With recent increased dose of niraparib at 200 mg daily, she complained of excessive nausea and fatigue. She has lost some weight. I recommend trial of premedication antiemetics before each dose of chemotherapy. If she could not tolerate that further, she has my permission to reduce 200 mg daily Given her significant pancytopenia, she will return here weekly for blood count monitoring and I plan to see her back in 2 weeks for further supportive care I plan to repeat CT imaging after 3 months of treatment.

## 2018-08-29 NOTE — Progress Notes (Signed)
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  With recent increased dose of niraparib at 200 mg daily, she complained of excessive nausea and fatigue. She has lost some weight. I recommend trial of premedication antiemetics before each dose of chemotherapy. If she could not tolerate that further, she has my permission to reduce 200 mg daily Given her significant pancytopenia, she will return here weekly for blood count monitoring and I plan to see her back in 2 weeks for further supportive care I plan to repeat CT imaging after 3 months of treatment.  Pancytopenia, acquired ([place]) She tolerated treatment poorly Despite recovery of her thrombocytopenia and stable anemia, she has excessive fatigue She has no bleeding complications or infectious complications She will continue her treatment at 200 mg daily and I plan to check her blood count weekly.  Chemotherapy-induced nausea She has severe, excessive nausea I recommend daily premedication with Zofran before each dose of treatment and to take antiemetics on a regular basis as needed   No orders of the defined types were placed in this encounter.   INTERVAL HISTORY: Please see below for problem oriented charting. She returns for further follow-up She complained of excessive fatigue and nausea with treatment since recent increment dose to 200 mg daily She denies fever or chills The patient denies any recent signs or symptoms of bleeding such as spontaneous epistaxis, hematuria or hematochezia. She denies nausea, changes in bowel habits, abdominal bloating or abdominal pain.  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  Surgical consultation is advised.  No findings to suggest metastatic disease in the abdomen/pelvis    02/13/2013 Pathology Results    1. Adnexa - ovary +/- tube, neoplastic - HIGH GRADE OVARIAN POORLY DIFFERENTIATED CARCINOMA WITH ASSOCIATED TUMOR NECROSIS, 11.5 CM, INVOLVING THE OVARIAN CAPSULE AND ADJACENT FALLOPIAN TUBE. - PLEASE SEE ONCOLOGY TEMPLATE FOR DETAILS. 2. Ovary and fallopian tube, left - HIGH GRADE OVARIAN POORLY DIFFERENTIATED CARCINOMA WITH ASSOCIATED TUMOR NECROSIS, 2.3 CM, INVOLVING THE OVARIAN CAPSULE. - FALLOPIAN TUBAL TISSUE, NO EVIDENCE OF MALIGNANCY. - PLEASE SEE ONCOLOGY TEMPLATE FOR DETAILS. 3. Uterus and cervix - ENDOMETRIUM: BENIGN WEAKLY PROLIFERATIVE ENDOMETRIUM, NO ATYPIA, HYPERPLASIA OR MALIGNANCY. - CERVIX: BENIGN SQUAMOUS MUCOSA AND ENDOCERVICAL MUCOSA, NO DYSPLASIA OR MALIGNANCY. - UTERINE SEROSA: ADHESIONS, NO EVIDENCE OF ENDOMETRIOSIS, ATYPIA OR MALIGNANCY. 4. Cul-de-sac biopsy - POSITIVE FOR METASTATIC CARCINOMA (2.0 CM). 5. Soft tissue mass, simple excision, peri rectal - POSITIVE FOR METASTATIC CARCINOMA (2.6 CM). 6. Lymph nodes, regional resection, right pelvic - SEVEN LYMPH NODES, NEGATIVE FOR METASTATIC CARCINOMA (0/7). 7. Omentum, resection for tumor - MATURE ADIPOSE TISSUE, NO EVIDENCE OF MALIGNANCY. Microscopic Comment 1. OVARY Specimen(s): Right and left ovaries and fallopian tubes, uterus and cervix Procedure(s): Total hysterectomy, bilateral salpingo-oophorectomy Lymph node sampling performed: Yes Primary tumor site: Right and  left ovaries Ovarian surface involvement:  Present Maximum tumor size (cm): Right ovary: 11.5 cm; left ovary: 2.3 cm, gross measurement. 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 please see comment. Grade: High grade Peritoneal implants: No Pelvic extension: Cul-de-sac biopsy and perirectal soft tissue biopsy: positive Peritoneal washings: Rare cluster of atypical cells present ([id number]) Lymph nodes: number examined 7 ; number positive 0 TNM code: pT2c, pN0 FIGO Stage (based on pathologic findings, needs clinical correlation): II B (2014 edition) Comments: Sections of bilateral ovaries show sheets and nests of malignant epithelium resembling transitional cell carcinoma with associated tumor necrosis. No evidence of Brenner's tumor or conventional endometrioid carcinoma is identified in the background. The bilateral ovarian capsules are involved. Immunohistochemical stains were performed and the malignant cells are strongly positive for cytokeratin 7, WT-1, p16 and p53, negative for cytokeratin 20. 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 The tumor involves the right fallopian tube. Both cul-de-sac and perirectal soft tissue biopsies are positive for metastatic carcinoma. The omentum biopsy is negative for tumor.     03/20/2013 - 08/14/2013 Chemotherapy    The patient had 6 cycles of carboplatin and taxol    09/25/2013 Imaging    1. Interval resection of previously described right pelvic mass. No evidence of recurrent or metastatic disease. 2. Small hiatal hernia. 3. Moderate amount of stool within the rectum. Question constipation or even mild fecal impaction.    04/03/2015 PET scan    1. Highly hypermetabolic height bowed dense mass in the medial spleen compatible with malignancy. 2. Bilateral pelvic sidewall adenopathy, right greater than left, hypermetabolic, with  small peritoneal implants of tumor in the lower pelvis, and with several faint but suspected omental implants of tumor. There is also a hypermetabolic aortocaval lymph node in the retroperitoneum. 3. Generalized increased thyroid activity could be due to thyroiditis. Although a well-defined nodule is not seen in the right thyroid lobe, there is focal increased hypermetabolic activity in the right thyroid lobe. Thyroid ultrasound is recommended for further investigation. 4. I suspect that the high activity between the spinous processes of L3 and L4 is degenerative.    05/01/2015 - 10/21/2015 Chemotherapy    The patient had 8 cycles of carboplatin, taxol and Avastin.      07/02/2015 Imaging    1. Decrease in size of these splenic lesion. Suspect tumor also involving the adjacent posterior wall of the stomach. 2. Improved pelvic lymphadenopathy. 3. No new omental or peritoneal surface disease.     09/10/2015 Imaging    1. Response to therapy, with further decreased size of a medial splenic lesion. The previously questioned extension into the stomach is no longer identified and may have been artifactual on the prior exam. Of note, an inferior splenic low-density capsular based lesion is felt to be similar and warrants followup attention. 2. No adenopathy or new peritoneal disease identified. 3.  Possible constipation. 4.  Advanced aortic and branch vessel atherosclerosis.    11/06/2015 Imaging    1. Mass arising from the medial spleen and involving the posterior wall of stomach demonstrates mild decrease in size in the interval. 2. Interval improvement and previously referenced pathologic iliac lymph nodes. 3. Previously referenced peritoneal implants within the pelvis are no longer measurable. No new areas of peritoneal metastasis is identified. 4. There is a right hilar node which is borderline enlarged but is increased in size when compared  with 04/19/2013. Attention on follow-up examination is  recommended.    11/18/2015 PET scan    1. Residual hypermetabolic tumor implant between the stomach and spleen, significantly decreased in size and mildly decreased in metabolism compared to 04/03/2015 PET-CT, and slightly decreased in size since 11/06/2015. 2. Residual hypermetabolic right deep pelvic side wall lymph node, with partial treatment response compared to 04/03/2015 PET-CT. 3. No new sites of hypermetabolic metastatic disease. 4. Stable diffuse thyroid hypermetabolism without discrete thyroid nodule, most suggestive of diffuse thyroiditis. Recommend correlation with serum thyroid function tests.     01/28/2016 - 11/26/2016 Anti-estrogen oral therapy    She was placed on Letrozole, stopped due to poor tolerance    02/09/2016 Imaging    1. Continued decrease in size of peritoneal lesion between the posterior wall of stomach and spleen. 2. No new or progressive disease identified    06/23/2016 Imaging    1. Near complete resolution of subtle residual soft tissue density lesion in the gastrosplenic ligament. 2. No new sites of disease identified. 3. Stool burden suggests constipation or even fecal impaction    12/01/2016 Imaging    1. No CT findings for residual or recurrent abdominal/pelvic ovarian cancer. 2. No acute abdominal/pelvic findings. 3. Large amount of stool throughout the colon and down into the rectum suggesting constipation. 4. Stable atherosclerotic calcifications involving the aorta and iliac arteries.    03/29/2017 - 10/29/2017 Anti-estrogen oral therapy    She was on Aromasin    04/19/2017 Imaging    1. Suspicion of peritoneal recurrence with enlarging implants in the gastrosplenic ligament, left adnexa and posterior to the cecum. No generalized ascites, adenopathy or definite solid organ disease. 2. No evidence of bowel or ureteral obstruction. 3.  Aortic Atherosclerosis (ICD10-I70.0).    06/25/2017 Genetic Testing    Patient has genetic testing done for  Breast/ovarian cancer panel Results revealed patient has no detectable mutation    09/01/2017 Tumor Marker    Patient's tumor was tested for the following markers: CA-125 Results of the tumor marker test revealed 40.9    10/29/2017 - 03/24/2018 Anti-estrogen oral therapy    She was on tamoxifen    11/05/2017 Tumor Marker    Patient's tumor was tested for the following markers: CA-125 Results of the tumor marker test revealed 84.4    11/15/2017 Imaging    1. Mild progression of peritoneal metastasis. 2. Possible constipation. No obstruction or other acute complication. 3.  Aortic Atherosclerosis (ICD10-I70.0).    03/21/2018 Tumor Marker    Patient's tumor was tested for the following markers: CA-125 Results of the tumor marker test revealed 101.3.    04/03/2018 Tumor Marker    Patient's tumor was tested for the following markers: CA-125 Results of the tumor marker test revealed 120.9    05/02/2018 Tumor Marker    Patient's tumor was tested for the following markers: CA-125 Results of the tumor marker test revealed 68.2    05/29/2018 Tumor Marker    Patient's tumor was tested for the following markers: CA-125 Results of the tumor marker test revealed 41.7     Genetic Testing    Patient has genetic testing done for HRD. Results revealed: negative for deleterious BRCA1 or BRCA2 mutations in tumor.     06/26/2018 Tumor Marker    Patient's tumor was tested for the following markers: CA-125 Results of the tumor marker test revealed 29.5    07/14/2018 Imaging    Interval decrease in size of bilateral adnexal masses.  No  significant change in size of peritoneal metastasis in the left upper quadrant.  No new or progressive metastatic disease identified.    08/07/2018 -  Chemotherapy    The patient had zejula    08/14/2018 Tumor Marker    Patient's tumor was tested for the following markers: CA-125 Results of the tumor marker test revealed 31.7     REVIEW OF SYSTEMS:    Constitutional: Denies fevers, chills or abnormal weight loss Eyes: Denies blurriness of vision Ears, nose, mouth, throat, and face: Denies mucositis or sore throat Respiratory: Denies cough, dyspnea or wheezes Cardiovascular: Denies palpitation, chest discomfort or lower extremity swelling Gastrointestinal:  Denies nausea, heartburn or change in bowel habits Skin: Denies abnormal skin rashes Lymphatics: Denies new lymphadenopathy or easy bruising Neurological:Denies numbness, tingling or new weaknesses Behavioral/Psych: Mood is stable, no new changes  All other systems were reviewed with the patient and are negative.  I have reviewed the past medical history, past surgical history, social history and family history with the patient and they are unchanged from previous note.  ALLERGIES:  is allergic to clonidine derivatives; coreg [carvedilol]; caffeine; codeine; flexeril [cyclobenzaprine]; lipitor [atorvastatin]; lisinopril; pravastatin; tape; tegaderm ag mesh [silver]; and ultram [tramadol].  MEDICATIONS:  Current Outpatient Medications  Medication Sig Dispense Refill  . acetaminophen (TYLENOL) 500 MG tablet Take 1,000 mg by mouth every 6 (six) hours as needed for mild pain.     Marland Kitchen amiodarone (PACERONE) 200 MG tablet Take 100 mg by mouth daily.  3  . amiodarone (PACERONE) 200 MG tablet TAKE 1/2 TABLET BY MOUTH DAILY 45 tablet 3  . amoxicillin (AMOXIL) 500 MG capsule Take 2,000 mg by mouth See admin instructions. Takes 4 capsules 1 hour prior to procedure    . Biotin 10 MG TABS Take 20 mg by mouth daily.    Marland Kitchen desonide (DESOWEN) 0.05 % cream Apply 1 application topically 2 (two) times daily.   1  . diphenhydrAMINE (BENADRYL) 25 MG tablet Take 50 mg by mouth at bedtime as needed for sleep.     Marland Kitchen docusate sodium (COLACE) 100 MG capsule Take 250 mg by mouth 2 (two) times daily.     Marland Kitchen ELIQUIS 5 MG TABS tablet TAKE 1 TABLET BY MOUTH TWICE A DAY 180 tablet 1  . Evolocumab with Infusor 420  MG/3.5ML SOCT Inject into the skin.    Marland Kitchen ezetimibe (ZETIA) 10 MG tablet Take 1 tablet (10 mg total) by mouth daily. (Patient taking differently: Take 10 mg by mouth at bedtime. ) 30 tablet 6  . famotidine (PEPCID) 20 MG tablet Take 20 mg by mouth 2 (two) times daily.    . Glucosamine HCl 1000 MG TABS Take 1,000 mg by mouth daily.    . hydrocortisone 1 % ointment Apply 1 application topically 2 (two) times daily as needed for itching (eczema).     Marland Kitchen ipratropium (ATROVENT) 0.06 % nasal spray Place 2 sprays into both nostrils daily.   5  . levothyroxine (SYNTHROID) 88 MCG tablet Take 88 mcg by mouth daily before breakfast.    . lidocaine-prilocaine (EMLA) cream Apply to Porta-Cath site 1-2 hours prior to access as directed. 30 g 1  . magnesium hydroxide (MILK OF MAGNESIA) 800 MG/5ML suspension Take 30 mLs by mouth daily as needed for constipation. Reported on 11/20/2015    . Melatonin 5 MG CAPS Take 5 mg by mouth at bedtime as needed (sleep). Reported on 12/25/2015    . Menthol, Topical Analgesic, (ICY HOT EX) Apply 1  application topically 2 (two) times daily as needed (PAIN).     . Multiple Vitamin (MULTIVITAMIN WITH MINERALS) TABS tablet Take 1 tablet by mouth daily. Centrum Silver    . Niraparib Tosylate 100 MG CAPS Take 200 mg by mouth daily. 60 capsule 11  . ondansetron (ZOFRAN) 8 MG tablet Take 1 tablet (8 mg total) by mouth every 8 (eight) hours as needed for nausea or vomiting. 90 tablet 1  . OVER THE COUNTER MEDICATION Co210 20 mg once a day for muscle aches.    Vladimir Faster Glycol-Propyl Glycol (SYSTANE OP) Place 1 drop into both eyes 2 (two) times daily.     . polyethylene glycol (MIRALAX / GLYCOLAX) packet Take 17 g by mouth daily. Mix in 8 oz liquid and drink    . potassium chloride (K-DUR) 10 MEQ tablet Take 10 mEq by mouth daily.    . prochlorperazine (COMPAZINE) 10 MG tablet Take 1 tablet (10 mg total) by mouth every 6 (six) hours as needed (Nausea or vomiting). 30 tablet 1  . SHINGRIX  injection      No current facility-administered medications for this visit.    Facility-Administered Medications Ordered in Other Visits  Medication Dose Route Frequency Provider Last Rate Last Dose  . sodium chloride flush (NS) 0.9 % injection 10 mL  10 mL Intravenous PRN Joylene John D, NP   10 mL at 01/06/18 1220    PHYSICAL EXAMINATION: ECOG PERFORMANCE STATUS: 1 - Symptomatic but completely ambulatory  Vitals:   08/28/18 1105  BP: (!) 146/82  Pulse: 85  Resp: 18  Temp: 98.1 F (36.7 C)  SpO2: 100%   Filed Weights   08/28/18 1105  Weight: 130 lb 12.8 oz (59.3 kg)    GENERAL:alert, no distress and comfortable SKIN: skin color, texture, turgor are normal, no rashes or significant lesions EYES: normal, Conjunctiva are pink and non-injected, sclera clear OROPHARYNX:no exudate, no erythema and lips, buccal mucosa, and tongue normal  NECK: supple, thyroid normal size, non-tender, without nodularity LYMPH:  no palpable lymphadenopathy in the cervical, axillary or inguinal LUNGS: clear to auscultation and percussion with normal breathing effort HEART: regular rate & rhythm and no murmurs and no lower extremity edema ABDOMEN:abdomen soft, non-tender and normal bowel sounds Musculoskeletal:no cyanosis of digits and no clubbing  NEURO: alert & oriented x 3 with fluent speech, no focal motor/sensory deficits  LABORATORY DATA:  I have reviewed the data as listed    Component Value Date/Time   NA 140 08/28/2018 1048   NA 141 11/11/2017 1145   K 4.0 08/28/2018 1048   K 4.0 11/11/2017 1145   CL 102 08/28/2018 1048   CL 103 05/21/2013 1105   CO2 31 08/28/2018 1048   CO2 29 11/11/2017 1145   GLUCOSE 107 (H) 08/28/2018 1048   GLUCOSE 81 11/11/2017 1145   GLUCOSE 74 05/21/2013 1105   BUN 11 08/28/2018 1048   BUN 10.9 11/11/2017 1145   CREATININE 0.88 08/28/2018 1048   CREATININE 0.7 11/11/2017 1145   CALCIUM 9.4 08/28/2018 1048   CALCIUM 9.6 11/11/2017 1145   PROT 6.4 (L)  08/28/2018 1048   PROT 6.5 11/24/2016 1104   ALBUMIN 4.1 08/28/2018 1048   ALBUMIN 3.9 11/24/2016 1104   AST 21 08/28/2018 1048   AST 18 11/24/2016 1104   ALT 13 08/28/2018 1048   ALT 15 11/24/2016 1104   ALKPHOS 90 08/28/2018 1048   ALKPHOS 104 11/24/2016 1104   BILITOT 0.4 08/28/2018 1048   BILITOT 0.57 11/24/2016  [place] 08/28/2018 1048   GFRAA >60 08/28/2018 1048    No results found for: SPEP, UPEP  Lab Results  Component Value Date   WBC 2.9 (L) 08/28/2018   NEUTROABS 1.6 08/28/2018   HGB 10.9 (L) 08/28/2018   HCT 31.6 (L) 08/28/2018   MCV 107.9 (H) 08/28/2018   PLT 178 08/28/2018      Chemistry      Component Value Date/Time   NA 140 08/28/2018 1048   NA 141 11/11/2017 1145   K 4.0 08/28/2018 1048   K 4.0 11/11/2017 1145   CL 102 08/28/2018 1048   CL 103 05/21/2013 1105   CO2 31 08/28/2018 1048   CO2 29 11/11/2017 1145   BUN 11 08/28/2018 1048   BUN 10.9 11/11/2017 1145   CREATININE 0.88 08/28/2018 1048   CREATININE 0.7 11/11/2017 1145      Component Value Date/Time   CALCIUM 9.4 08/28/2018 1048   CALCIUM 9.6 11/11/2017 1145   ALKPHOS 90 08/28/2018 1048   ALKPHOS 104 11/24/2016 1104   AST 21 08/28/2018 1048   AST 18 11/24/2016 1104   ALT 13 08/28/2018 1048   ALT 15 11/24/2016 1104   BILITOT 0.4 08/28/2018 1048   BILITOT 0.57 11/24/2016 1104       All questions were answered. The patient knows to call the clinic with any problems, questions or concerns. No barriers to learning was detected.  I spent 15 minutes counseling the patient face to face. The total time spent in the appointment was 20 minutes and more than 50% was on counseling and review of test results  Heath Lark, MD 08/29/2018 7:21 AM

## 2018-08-29 NOTE — Assessment & Plan Note (Signed)
She has severe, excessive nausea I recommend daily premedication with Zofran before each dose of treatment and to take antiemetics on a regular basis as needed

## 2018-09-01 MED FILL — ZEJULA 100 MG CAPS: 100 | 30 days supply | Qty: 60 | Fill #1

## 2018-09-04 ENCOUNTER — Inpatient Hospital Stay: Payer: Medicare Other | Attending: Gynecology

## 2018-09-04 ENCOUNTER — Telehealth: Payer: Self-pay

## 2018-09-04 DIAGNOSIS — Z9079 Acquired absence of other genital organ(s): Secondary | ICD-10-CM | POA: Diagnosis not present

## 2018-09-04 DIAGNOSIS — C569 Malignant neoplasm of unspecified ovary: Secondary | ICD-10-CM

## 2018-09-04 DIAGNOSIS — Z90710 Acquired absence of both cervix and uterus: Secondary | ICD-10-CM | POA: Insufficient documentation

## 2018-09-04 DIAGNOSIS — C561 Malignant neoplasm of right ovary: Secondary | ICD-10-CM | POA: Insufficient documentation

## 2018-09-04 DIAGNOSIS — Z90722 Acquired absence of ovaries, bilateral: Secondary | ICD-10-CM | POA: Diagnosis not present

## 2018-09-04 DIAGNOSIS — D61818 Other pancytopenia: Secondary | ICD-10-CM | POA: Insufficient documentation

## 2018-09-04 DIAGNOSIS — Z9221 Personal history of antineoplastic chemotherapy: Secondary | ICD-10-CM | POA: Insufficient documentation

## 2018-09-04 DIAGNOSIS — K5909 Other constipation: Secondary | ICD-10-CM | POA: Insufficient documentation

## 2018-09-04 DIAGNOSIS — R110 Nausea: Secondary | ICD-10-CM | POA: Diagnosis not present

## 2018-09-04 DIAGNOSIS — Z79899 Other long term (current) drug therapy: Secondary | ICD-10-CM | POA: Diagnosis not present

## 2018-09-04 DIAGNOSIS — C786 Secondary malignant neoplasm of retroperitoneum and peritoneum: Secondary | ICD-10-CM | POA: Insufficient documentation

## 2018-09-04 DIAGNOSIS — Z7189 Other specified counseling: Secondary | ICD-10-CM

## 2018-09-04 LAB — CMP (CANCER CENTER ONLY)
ALK PHOS: 91 U/L (ref 38–126)
ALT: 14 U/L (ref 0–44)
ANION GAP: 7 (ref 5–15)
AST: 23 U/L (ref 15–41)
Albumin: 4.2 g/dL (ref 3.5–5.0)
BUN: 13 mg/dL (ref 8–23)
CALCIUM: 9.6 mg/dL (ref 8.9–10.3)
CO2: 32 mmol/L (ref 22–32)
Chloride: 101 mmol/L (ref 98–111)
Creatinine: 0.94 mg/dL (ref 0.44–1.00)
GFR, Estimated: 58 mL/min — ABNORMAL LOW (ref >60)
Glucose, Bld: 84 mg/dL (ref 70–99)
Potassium: 3.8 mmol/L (ref 3.5–5.1)
Sodium: 140 mmol/L (ref 135–145)
Total Bilirubin: 0.5 mg/dL (ref 0.3–1.2)
Total Protein: 6.7 g/dL (ref 6.5–8.1)

## 2018-09-04 LAB — CBC WITH DIFFERENTIAL (CANCER CENTER ONLY)
BASOS ABS: 0 10*3/uL (ref 0.0–0.1)
Basophils Relative: 0 %
Eosinophils Absolute: 0 10*3/uL (ref 0.0–0.5)
Eosinophils Relative: 0 %
HEMATOCRIT: 32.8 % — AB (ref 34.8–46.6)
HEMOGLOBIN: 10.9 g/dL — AB (ref 11.6–15.9)
Lymphocytes Relative: 39 %
Lymphs Abs: 1.2 10*3/uL (ref 0.9–3.3)
MCH: 36.5 pg — ABNORMAL HIGH (ref 25.1–34.0)
MCHC: 33.2 g/dL (ref 31.5–36.0)
MCV: 109.7 fL — ABNORMAL HIGH (ref 79.5–101.0)
MONOS PCT: 15 %
Monocytes Absolute: 0.5 10*3/uL (ref 0.1–0.9)
NEUTROS ABS: 1.4 10*3/uL — AB (ref 1.5–6.5)
NEUTROS PCT: 46 %
Platelet Count: 110 10*3/uL — ABNORMAL LOW (ref 145–400)
RBC: 2.99 MIL/uL — ABNORMAL LOW (ref 3.70–5.45)
RDW: 14.2 % (ref 11.2–14.5)
WBC Count: 3.1 10*3/uL — ABNORMAL LOW (ref 3.9–10.3)

## 2018-09-04 NOTE — Telephone Encounter (Signed)
Called back and given below message. She verbalized understanding. 

## 2018-09-04 NOTE — Telephone Encounter (Signed)
Called and left a message for her to call the office.

## 2018-09-04 NOTE — Telephone Encounter (Signed)
-----   Message from Heath Lark, MD sent at 09/04/2018  1:18 PM EDT ----- Regarding: labs are low lasb are low Reduce zejula to 100 mg ----- Message ----- From: Interface, Lab In [place] Sent: 09/04/2018  11:43 AM EDT To: Heath Lark, MD

## 2018-09-05 ENCOUNTER — Telehealth: Payer: Self-pay

## 2018-09-05 NOTE — Telephone Encounter (Signed)
Called and left below message. Instructed to call the office for questions. 

## 2018-09-05 NOTE — Telephone Encounter (Signed)
TUMS as tolerated

## 2018-09-05 NOTE — Telephone Encounter (Signed)
She called and left message. She is taking Zejula. She is having burping all the time and slight indigestion/ heartburn. She looked up the side effects of Zejula and heartburn can be a side effect.  She is currently taking Pepcid daily and Gas x 2 x a day. What would Dr. Alvy Bimler suggest she take?

## 2018-09-11 ENCOUNTER — Encounter: Payer: Self-pay | Admitting: Hematology and Oncology

## 2018-09-11 ENCOUNTER — Telehealth: Payer: Self-pay | Admitting: Hematology and Oncology

## 2018-09-11 ENCOUNTER — Inpatient Hospital Stay: Payer: Medicare Other | Admitting: Hematology and Oncology

## 2018-09-11 ENCOUNTER — Inpatient Hospital Stay: Payer: Medicare Other

## 2018-09-11 VITALS — BP 148/71 | HR 80 | Temp 97.6°F | Resp 18 | Ht 63.0 in | Wt 132.2 lb

## 2018-09-11 DIAGNOSIS — Z7189 Other specified counseling: Secondary | ICD-10-CM

## 2018-09-11 DIAGNOSIS — C569 Malignant neoplasm of unspecified ovary: Secondary | ICD-10-CM

## 2018-09-11 DIAGNOSIS — C786 Secondary malignant neoplasm of retroperitoneum and peritoneum: Secondary | ICD-10-CM | POA: Diagnosis not present

## 2018-09-11 DIAGNOSIS — K5909 Other constipation: Secondary | ICD-10-CM | POA: Diagnosis not present

## 2018-09-11 DIAGNOSIS — D61818 Other pancytopenia: Secondary | ICD-10-CM | POA: Diagnosis not present

## 2018-09-11 DIAGNOSIS — C561 Malignant neoplasm of right ovary: Secondary | ICD-10-CM | POA: Diagnosis not present

## 2018-09-11 DIAGNOSIS — R110 Nausea: Secondary | ICD-10-CM

## 2018-09-11 DIAGNOSIS — Z90710 Acquired absence of both cervix and uterus: Secondary | ICD-10-CM

## 2018-09-11 DIAGNOSIS — Z79899 Other long term (current) drug therapy: Secondary | ICD-10-CM

## 2018-09-11 DIAGNOSIS — Z9221 Personal history of antineoplastic chemotherapy: Secondary | ICD-10-CM

## 2018-09-11 DIAGNOSIS — Z90722 Acquired absence of ovaries, bilateral: Secondary | ICD-10-CM

## 2018-09-11 DIAGNOSIS — Z9079 Acquired absence of other genital organ(s): Secondary | ICD-10-CM

## 2018-09-11 LAB — CBC WITH DIFFERENTIAL (CANCER CENTER ONLY)
Abs Immature Granulocytes: 0.02 10*3/uL (ref 0.00–0.07)
BASOS PCT: 1 %
Basophils Absolute: 0 10*3/uL (ref 0.0–0.1)
EOS ABS: 0 10*3/uL (ref 0.0–0.5)
EOS PCT: 0 %
HCT: 26 % — ABNORMAL LOW (ref 36.0–46.0)
Hemoglobin: 9.1 g/dL — ABNORMAL LOW (ref 12.0–15.0)
IMMATURE GRANULOCYTES: 1 %
Lymphocytes Relative: 37 %
Lymphs Abs: 1.4 10*3/uL (ref 0.7–4.0)
MCH: 37.9 pg — AB (ref 26.0–34.0)
MCHC: 35 g/dL (ref 30.0–36.0)
MCV: 108.3 fL — ABNORMAL HIGH (ref 80.0–100.0)
MONO ABS: 0.4 10*3/uL (ref 0.1–1.0)
MONOS PCT: 11 %
Neutro Abs: 1.9 10*3/uL (ref 1.7–7.7)
Neutrophils Relative %: 50 %
PLATELETS: 80 10*3/uL — AB (ref 150–400)
RBC: 2.4 MIL/uL — ABNORMAL LOW (ref 3.87–5.11)
RDW: 13.4 % (ref 11.5–15.5)
WBC: 3.8 10*3/uL — AB (ref 4.0–10.5)
nRBC: 0 % (ref 0.0–0.2)

## 2018-09-11 LAB — CMP (CANCER CENTER ONLY)
ALT: 17 U/L (ref 0–44)
AST: 23 U/L (ref 15–41)
Albumin: 4 g/dL (ref 3.5–5.0)
Alkaline Phosphatase: 96 U/L (ref 38–126)
Anion gap: 8 (ref 5–15)
BUN: 14 mg/dL (ref 8–23)
CALCIUM: 9.3 mg/dL (ref 8.9–10.3)
CO2: 29 mmol/L (ref 22–32)
Chloride: 102 mmol/L (ref 98–111)
Creatinine: 0.89 mg/dL (ref 0.44–1.00)
GFR, Estimated: >60 mL/min (ref >60)
GLUCOSE: 110 mg/dL — AB (ref 70–99)
POTASSIUM: 3.8 mmol/L (ref 3.5–5.1)
SODIUM: 139 mmol/L (ref 135–145)
Total Bilirubin: 0.5 mg/dL (ref 0.3–1.2)
Total Protein: 6.3 g/dL — ABNORMAL LOW (ref 6.5–8.1)

## 2018-09-11 MED ORDER — SODIUM CHLORIDE 0.9% FLUSH
10.0000 mL | Freq: Once | INTRAVENOUS | Status: AC
  Administered 2018-09-11: 10 mL
  Filled 2018-09-11: qty 10

## 2018-09-11 MED ORDER — HEPARIN SOD (PORK) LOCK FLUSH 100 UNIT/ML IV SOLN
250.0000 [IU] | Freq: Once | INTRAVENOUS | Status: AC
  Administered 2018-09-11: 250 [IU]
  Filled 2018-09-11: qty 5

## 2018-09-11 NOTE — Assessment & Plan Note (Signed)
She tolerated treatment poorly She has no bleeding complications or infectious complications She will hold treatment today She will return next week for CBC check If her platelet count improves to over 100,000, she will resume niraparib at 100 mg daily There is no contraindication to remain on antiplatelet agents or anticoagulants as long as the platelet is greater than 50,000.

## 2018-09-11 NOTE — Assessment & Plan Note (Signed)
She tolerates treatment poorly with recurrent pancytopenia despite recent dose adjustment I recommend holding her treatment for a week and recheck blood count next week I recommend minimum 3 months of treatment before repeat imaging study, planned around December

## 2018-09-11 NOTE — Progress Notes (Signed)
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[facility] She tolerates treatment poorly with recurrent pancytopenia despite recent dose adjustment I recommend holding her treatment for a week and recheck blood count next week I recommend minimum 3 months of treatment before repeat imaging study, planned around December  Pancytopenia, acquired [facility]) She tolerated treatment poorly She has no bleeding complications or infectious complications She will hold treatment today She will return next week for CBC check If her platelet count improves to over 100,000, she will resume niraparib at 100 mg daily There is no contraindication to remain on antiplatelet agents or anticoagulants as long as the platelet is greater than 50,000.    Other constipation She has stable chronic constipation and bloating She will continue laxative therapy   Orders Placed This Encounter  Procedures  . CT ABDOMEN PELVIS W CONTRAST    Standing Status:   Future    Standing Expiration Date:   09/12/2019    Order Specific Question:   If indicated for the ordered procedure, I authorize the administration of contrast media per Radiology protocol    Answer:   Yes    Order Specific Question:   Preferred imaging location?    Answer:   [facility]    Order Specific Question:   Radiology Contrast Protocol - do NOT remove file path    Answer:   [email or web address]  . CA 125    Standing Status:   Standing    Number of Occurrences:   11    Standing Expiration Date:   09/12/2019    INTERVAL HISTORY: Please see below for problem oriented charting. She returns for further follow-up She felt that taking Zofran on a regular basis has helped with her nausea She continues to have chronic bloating and constipation, stable No recent infection, fever or chills The patient  denies any recent signs or symptoms of bleeding such as spontaneous epistaxis, hematuria or hematochezia.   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  Surgical consultation is advised.  No findings to suggest metastatic disease in the abdomen/pelvis    02/13/2013 Pathology Results    1. Adnexa - ovary +/- tube, neoplastic - HIGH GRADE OVARIAN POORLY DIFFERENTIATED CARCINOMA WITH ASSOCIATED TUMOR NECROSIS, 11.5 CM, INVOLVING THE OVARIAN CAPSULE AND ADJACENT FALLOPIAN TUBE. - PLEASE SEE ONCOLOGY TEMPLATE FOR DETAILS. 2. Ovary and fallopian tube, left - HIGH GRADE OVARIAN POORLY DIFFERENTIATED CARCINOMA WITH ASSOCIATED TUMOR NECROSIS, 2.3 CM, INVOLVING THE OVARIAN CAPSULE. - FALLOPIAN TUBAL TISSUE, NO EVIDENCE OF MALIGNANCY. - PLEASE SEE ONCOLOGY TEMPLATE FOR DETAILS. 3. Uterus and cervix - ENDOMETRIUM: BENIGN WEAKLY PROLIFERATIVE ENDOMETRIUM, NO ATYPIA, HYPERPLASIA OR MALIGNANCY. - CERVIX: BENIGN SQUAMOUS MUCOSA AND ENDOCERVICAL MUCOSA, NO DYSPLASIA OR MALIGNANCY. - UTERINE SEROSA: ADHESIONS, NO EVIDENCE OF ENDOMETRIOSIS, ATYPIA OR MALIGNANCY. 4. Cul-de-sac biopsy - POSITIVE FOR METASTATIC CARCINOMA (2.0  CM). 5. Soft tissue mass, simple excision, peri rectal - POSITIVE FOR METASTATIC CARCINOMA (2.6 CM). 6. Lymph nodes, regional resection, right pelvic - SEVEN LYMPH NODES, NEGATIVE FOR METASTATIC CARCINOMA (0/7). 7. Omentum, resection for tumor - MATURE ADIPOSE TISSUE, NO  EVIDENCE OF MALIGNANCY. Microscopic Comment 1. OVARY Specimen(s): Right and left ovaries and fallopian tubes, uterus and cervix Procedure(s): Total hysterectomy, bilateral salpingo-oophorectomy Lymph node sampling performed: Yes Primary tumor site: Right and left ovaries Ovarian surface involvement: Present Maximum tumor size (cm): Right ovary: 11.5 cm; left ovary: 2.3 cm, gross measurement. 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 please see comment. Grade: High grade Peritoneal implants: No Pelvic extension: Cul-de-sac biopsy and perirectal soft tissue biopsy: positive Peritoneal washings: Rare cluster of atypical cells present ([id number]) Lymph nodes: number examined 7 ; number positive 0 TNM code: pT2c, pN0 FIGO Stage (based on pathologic findings, needs clinical correlation): II B (2014 edition) Comments: Sections of bilateral ovaries show sheets and nests of malignant epithelium resembling transitional cell carcinoma with associated tumor necrosis. No evidence of Brenner's tumor or conventional endometrioid carcinoma is identified in the background. The bilateral ovarian capsules are involved. Immunohistochemical stains were performed and the malignant cells are strongly positive for cytokeratin 7, WT-1, p16 and p53, negative for cytokeratin 20. 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 The tumor involves the right fallopian tube. Both cul-de-sac and perirectal soft tissue biopsies are positive for metastatic carcinoma. The omentum biopsy is negative for tumor.     03/20/2013 - 08/14/2013 Chemotherapy    The patient had 6 cycles of carboplatin and taxol    09/25/2013 Imaging    1. Interval resection of previously described right pelvic mass. No evidence of recurrent or metastatic disease. 2. Small hiatal hernia. 3.  Moderate amount of stool within the rectum. Question constipation or even mild fecal impaction.    04/03/2015 PET scan    1. Highly hypermetabolic height bowed dense mass in the medial spleen compatible with malignancy. 2. Bilateral pelvic sidewall adenopathy, right greater than left, hypermetabolic, with small peritoneal implants of tumor in the lower pelvis, and with several faint but suspected omental implants of tumor. There is also a hypermetabolic aortocaval lymph node in the retroperitoneum. 3. Generalized increased thyroid activity could be due to thyroiditis. Although a well-defined nodule is not seen in the right thyroid lobe, there is focal increased hypermetabolic activity in the right thyroid lobe. Thyroid ultrasound is recommended for further investigation. 4. I suspect that the high activity between the spinous processes of L3 and L4 is degenerative.    05/01/2015 - 10/21/2015 Chemotherapy    The patient had 8 cycles of carboplatin, taxol and Avastin.      07/02/2015 Imaging    1. Decrease in size of these splenic lesion. Suspect tumor also involving the adjacent posterior wall of the stomach. 2. Improved pelvic lymphadenopathy. 3. No new omental or peritoneal surface disease.     09/10/2015 Imaging    1. Response to therapy, with further decreased size of a medial splenic lesion. The previously questioned extension into the stomach is no longer identified and may have been artifactual on the prior exam. Of note, an inferior splenic low-density capsular based lesion is felt to be similar and warrants followup attention. 2. No adenopathy or new peritoneal disease identified. 3.  Possible constipation. 4.  Advanced aortic and branch vessel atherosclerosis.  11/06/2015 Imaging    1. Mass arising from the medial spleen and involving the posterior wall of stomach demonstrates mild decrease in size in the interval. 2. Interval improvement and previously referenced pathologic iliac  lymph nodes. 3. Previously referenced peritoneal implants within the pelvis are no longer measurable. No new areas of peritoneal metastasis is identified. 4. There is a right hilar node which is borderline enlarged but is increased in size when compared with 04/19/2013. Attention on follow-up examination is recommended.    11/18/2015 PET scan    1. Residual hypermetabolic tumor implant between the stomach and spleen, significantly decreased in size and mildly decreased in metabolism compared to 04/03/2015 PET-CT, and slightly decreased in size since 11/06/2015. 2. Residual hypermetabolic right deep pelvic side wall lymph node, with partial treatment response compared to 04/03/2015 PET-CT. 3. No new sites of hypermetabolic metastatic disease. 4. Stable diffuse thyroid hypermetabolism without discrete thyroid nodule, most suggestive of diffuse thyroiditis. Recommend correlation with serum thyroid function tests.     01/28/2016 - 11/26/2016 Anti-estrogen oral therapy    She was placed on Letrozole, stopped due to poor tolerance    02/09/2016 Imaging    1. Continued decrease in size of peritoneal lesion between the posterior wall of stomach and spleen. 2. No new or progressive disease identified    06/23/2016 Imaging    1. Near complete resolution of subtle residual soft tissue density lesion in the gastrosplenic ligament. 2. No new sites of disease identified. 3. Stool burden suggests constipation or even fecal impaction    12/01/2016 Imaging    1. No CT findings for residual or recurrent abdominal/pelvic ovarian cancer. 2. No acute abdominal/pelvic findings. 3. Large amount of stool throughout the colon and down into the rectum suggesting constipation. 4. Stable atherosclerotic calcifications involving the aorta and iliac arteries.    03/29/2017 - 10/29/2017 Anti-estrogen oral therapy    She was on Aromasin    04/19/2017 Imaging    1. Suspicion of peritoneal recurrence with enlarging implants  in the gastrosplenic ligament, left adnexa and posterior to the cecum. No generalized ascites, adenopathy or definite solid organ disease. 2. No evidence of bowel or ureteral obstruction. 3.  Aortic Atherosclerosis (ICD10-I70.0).    06/25/2017 Genetic Testing    Patient has genetic testing done for Breast/ovarian cancer panel Results revealed patient has no detectable mutation    09/01/2017 Tumor Marker    Patient's tumor was tested for the following markers: CA-125 Results of the tumor marker test revealed 40.9    10/29/2017 - 03/24/2018 Anti-estrogen oral therapy    She was on tamoxifen    11/05/2017 Tumor Marker    Patient's tumor was tested for the following markers: CA-125 Results of the tumor marker test revealed 84.4    11/15/2017 Imaging    1. Mild progression of peritoneal metastasis. 2. Possible constipation. No obstruction or other acute complication. 3.  Aortic Atherosclerosis (ICD10-I70.0).    03/21/2018 Tumor Marker    Patient's tumor was tested for the following markers: CA-125 Results of the tumor marker test revealed 101.3.    04/03/2018 Tumor Marker    Patient's tumor was tested for the following markers: CA-125 Results of the tumor marker test revealed 120.9    05/02/2018 Tumor Marker    Patient's tumor was tested for the following markers: CA-125 Results of the tumor marker test revealed 68.2    05/29/2018 Tumor Marker    Patient's tumor was tested for the following markers: CA-125 Results of the tumor marker  test revealed 41.7     Genetic Testing    Patient has genetic testing done for HRD. Results revealed: negative for deleterious BRCA1 or BRCA2 mutations in tumor.     06/26/2018 Tumor Marker    Patient's tumor was tested for the following markers: CA-125 Results of the tumor marker test revealed 29.5    07/14/2018 Imaging    Interval decrease in size of bilateral adnexal masses.  No significant change in size of peritoneal metastasis in the left upper  quadrant.  No new or progressive metastatic disease identified.    08/07/2018 -  Chemotherapy    The patient had zejula    08/14/2018 Tumor Marker    Patient's tumor was tested for the following markers: CA-125 Results of the tumor marker test revealed 31.7     REVIEW OF SYSTEMS:   Constitutional: Denies fevers, chills or abnormal weight loss Eyes: Denies blurriness of vision Ears, nose, mouth, throat, and face: Denies mucositis or sore throat Respiratory: Denies cough, dyspnea or wheezes Cardiovascular: Denies palpitation, chest discomfort or lower extremity swelling Skin: Denies abnormal skin rashes Lymphatics: Denies new lymphadenopathy or easy bruising Neurological:Denies numbness, tingling or new weaknesses Behavioral/Psych: Mood is stable, no new changes  All other systems were reviewed with the patient and are negative.  I have reviewed the past medical history, past surgical history, social history and family history with the patient and they are unchanged from previous note.  ALLERGIES:  is allergic to clonidine derivatives; coreg [carvedilol]; caffeine; codeine; flexeril [cyclobenzaprine]; lipitor [atorvastatin]; lisinopril; pravastatin; tape; tegaderm ag mesh [silver]; and ultram [tramadol].  MEDICATIONS:  Current Outpatient Medications  Medication Sig Dispense Refill  . acetaminophen (TYLENOL) 500 MG tablet Take 1,000 mg by mouth every 6 (six) hours as needed for mild pain.     Marland Kitchen amiodarone (PACERONE) 200 MG tablet Take 100 mg by mouth daily.  3  . amiodarone (PACERONE) 200 MG tablet TAKE 1/2 TABLET BY MOUTH DAILY 45 tablet 3  . amoxicillin (AMOXIL) 500 MG capsule Take 2,000 mg by mouth See admin instructions. Takes 4 capsules 1 hour prior to procedure    . Biotin 10 MG TABS Take 20 mg by mouth daily.    Marland Kitchen desonide (DESOWEN) 0.05 % cream Apply 1 application topically 2 (two) times daily.   1  . diphenhydrAMINE (BENADRYL) 25 MG tablet Take 50 mg by mouth at bedtime as  needed for sleep.     Marland Kitchen docusate sodium (COLACE) 100 MG capsule Take 250 mg by mouth 2 (two) times daily.     Marland Kitchen ELIQUIS 5 MG TABS tablet TAKE 1 TABLET BY MOUTH TWICE A DAY 180 tablet 1  . Evolocumab with Infusor 420 MG/3.5ML SOCT Inject into the skin.    Marland Kitchen ezetimibe (ZETIA) 10 MG tablet Take 1 tablet (10 mg total) by mouth daily. (Patient taking differently: Take 10 mg by mouth at bedtime. ) 30 tablet 6  . famotidine (PEPCID) 20 MG tablet Take 20 mg by mouth 2 (two) times daily.    . Glucosamine HCl 1000 MG TABS Take 1,000 mg by mouth daily.    . hydrocortisone 1 % ointment Apply 1 application topically 2 (two) times daily as needed for itching (eczema).     Marland Kitchen ipratropium (ATROVENT) 0.06 % nasal spray Place 2 sprays into both nostrils daily.   5  . levothyroxine (SYNTHROID) 88 MCG tablet Take 88 mcg by mouth daily before breakfast.    . lidocaine-prilocaine (EMLA) cream Apply to Porta-Cath site 1-2  hours prior to access as directed. 30 g 1  . magnesium hydroxide (MILK OF MAGNESIA) 800 MG/5ML suspension Take 30 mLs by mouth daily as needed for constipation. Reported on 11/20/2015    . Melatonin 5 MG CAPS Take 5 mg by mouth at bedtime as needed (sleep). Reported on 12/25/2015    . Menthol, Topical Analgesic, (ICY HOT EX) Apply 1 application topically 2 (two) times daily as needed (PAIN).     . Multiple Vitamin (MULTIVITAMIN WITH MINERALS) TABS tablet Take 1 tablet by mouth daily. Centrum Silver    . Niraparib Tosylate 100 MG CAPS Take 200 mg by mouth daily. 60 capsule 11  . ondansetron (ZOFRAN) 8 MG tablet Take 1 tablet (8 mg total) by mouth every 8 (eight) hours as needed for nausea or vomiting. 90 tablet 1  . OVER THE COUNTER MEDICATION Co210 20 mg once a day for muscle aches.    Vladimir Faster Glycol-Propyl Glycol (SYSTANE OP) Place 1 drop into both eyes 2 (two) times daily.     . polyethylene glycol (MIRALAX / GLYCOLAX) packet Take 17 g by mouth daily. Mix in 8 oz liquid and drink    . potassium  chloride (K-DUR) 10 MEQ tablet Take 10 mEq by mouth daily.    . prochlorperazine (COMPAZINE) 10 MG tablet Take 1 tablet (10 mg total) by mouth every 6 (six) hours as needed (Nausea or vomiting). 30 tablet 1  . SHINGRIX injection      No current facility-administered medications for this visit.    Facility-Administered Medications Ordered in Other Visits  Medication Dose Route Frequency Provider Last Rate Last Dose  . sodium chloride flush (NS) 0.9 % injection 10 mL  10 mL Intravenous PRN Joylene John D, NP   10 mL at 01/06/18 1220    PHYSICAL EXAMINATION: ECOG PERFORMANCE STATUS: 1 - Symptomatic but completely ambulatory  Vitals:   09/11/18 1240  BP: (!) 148/71  Pulse: 80  Resp: 18  Temp: 97.6 F (36.4 C)  SpO2: 100%   Filed Weights   09/11/18 1240  Weight: 132 lb 3.2 oz (60 kg)    GENERAL:alert, no distress and comfortable SKIN: skin color, texture, turgor are normal, no rashes or significant lesions EYES: normal, Conjunctiva are pink and non-injected, sclera clear OROPHARYNX:no exudate, no erythema and lips, buccal mucosa, and tongue normal  NECK: supple, thyroid normal size, non-tender, without nodularity LYMPH:  no palpable lymphadenopathy in the cervical, axillary or inguinal LUNGS: clear to auscultation and percussion with normal breathing effort HEART: regular rate & rhythm and no murmurs and no lower extremity edema ABDOMEN:abdomen soft, mildly distended, non-tender and normal bowel sounds Musculoskeletal:no cyanosis of digits and no clubbing  NEURO: alert & oriented x 3 with fluent speech, no focal motor/sensory deficits  LABORATORY DATA:  I have reviewed the data as listed    Component Value Date/Time   NA 139 09/11/2018 1242   NA 141 11/11/2017 1145   K 3.8 09/11/2018 1242   K 4.0 11/11/2017 1145   CL 102 09/11/2018 1242   CL 103 05/21/2013 1105   CO2 29 09/11/2018 1242   CO2 29 11/11/2017 1145   GLUCOSE 110 (H) 09/11/2018 1242   GLUCOSE 81 11/11/2017  1145   GLUCOSE 74 05/21/2013 1105   BUN 14 09/11/2018 1242   BUN 10.9 11/11/2017 1145   CREATININE 0.89 09/11/2018 1242   CREATININE 0.7 11/11/2017 1145   CALCIUM 9.3 09/11/2018 1242   CALCIUM 9.6 11/11/2017 1145   PROT 6.3 (L)  09/11/2018 1242   PROT 6.5 11/24/2016 1104   ALBUMIN 4.0 09/11/2018 1242   ALBUMIN 3.9 11/24/2016 1104   AST 23 09/11/2018 1242   AST 18 11/24/2016 1104   ALT 17 09/11/2018 1242   ALT 15 11/24/2016 1104   ALKPHOS 96 09/11/2018 1242   ALKPHOS 104 11/24/2016 1104   BILITOT 0.5 09/11/2018 1242   BILITOT 0.57 11/24/2016 1104   GFRNONAA >60 09/11/2018 1242   GFRAA >60 09/11/2018 1242    No results found for: SPEP, UPEP  Lab Results  Component Value Date   WBC 3.8 (L) 09/11/2018   NEUTROABS 1.9 09/11/2018   HGB 9.1 (L) 09/11/2018   HCT 26.0 (L) 09/11/2018   MCV 108.3 (H) 09/11/2018   PLT 80 (L) 09/11/2018      Chemistry      Component Value Date/Time   NA 139 09/11/2018 1242   NA 141 11/11/2017 1145   K 3.8 09/11/2018 1242   K 4.0 11/11/2017 1145   CL 102 09/11/2018 1242   CL 103 05/21/2013 1105   CO2 29 09/11/2018 1242   CO2 29 11/11/2017 1145   BUN 14 09/11/2018 1242   BUN 10.9 11/11/2017 1145   CREATININE 0.89 09/11/2018 1242   CREATININE 0.7 11/11/2017 1145      Component Value Date/Time   CALCIUM 9.3 09/11/2018 1242   CALCIUM 9.6 11/11/2017 1145   ALKPHOS 96 09/11/2018 1242   ALKPHOS 104 11/24/2016 1104   AST 23 09/11/2018 1242   AST 18 11/24/2016 1104   ALT 17 09/11/2018 1242   ALT 15 11/24/2016 1104   BILITOT 0.5 09/11/2018 1242   BILITOT 0.57 11/24/2016 1104       All questions were answered. The patient knows to call the clinic with any problems, questions or concerns. No barriers to learning was detected.  I spent 15 minutes counseling the patient face to face. The total time spent in the appointment was 20 minutes and more than 50% was on counseling and review of test results  Heath Lark, MD 09/11/2018 2:30 PM

## 2018-09-11 NOTE — Assessment & Plan Note (Signed)
She has stable chronic constipation and bloating She will continue laxative therapy

## 2018-09-11 NOTE — Telephone Encounter (Signed)
Gave pt avs and calendar  °

## 2018-09-12 ENCOUNTER — Encounter (HOSPITAL_COMMUNITY): Payer: Medicare Other

## 2018-09-13 ENCOUNTER — Encounter (HOSPITAL_COMMUNITY): Payer: Medicare Other

## 2018-09-18 ENCOUNTER — Inpatient Hospital Stay: Payer: Medicare Other

## 2018-09-18 ENCOUNTER — Telehealth: Payer: Self-pay

## 2018-09-18 DIAGNOSIS — Z7189 Other specified counseling: Secondary | ICD-10-CM

## 2018-09-18 DIAGNOSIS — C561 Malignant neoplasm of right ovary: Secondary | ICD-10-CM | POA: Diagnosis not present

## 2018-09-18 DIAGNOSIS — C569 Malignant neoplasm of unspecified ovary: Secondary | ICD-10-CM

## 2018-09-18 LAB — CBC WITH DIFFERENTIAL (CANCER CENTER ONLY)
ABS IMMATURE GRANULOCYTES: 0.01 10*3/uL (ref 0.00–0.07)
Basophils Absolute: 0 10*3/uL (ref 0.0–0.1)
Basophils Relative: 1 %
Eosinophils Absolute: 0 10*3/uL (ref 0.0–0.5)
Eosinophils Relative: 1 %
HEMATOCRIT: 25.5 % — AB (ref 36.0–46.0)
HEMOGLOBIN: 8.6 g/dL — AB (ref 12.0–15.0)
IMMATURE GRANULOCYTES: 0 %
LYMPHS ABS: 1.1 10*3/uL (ref 0.7–4.0)
LYMPHS PCT: 34 %
MCH: 37.9 pg — ABNORMAL HIGH (ref 26.0–34.0)
MCHC: 33.7 g/dL (ref 30.0–36.0)
MCV: 112.3 fL — AB (ref 80.0–100.0)
MONOS PCT: 13 %
Monocytes Absolute: 0.4 10*3/uL (ref 0.1–1.0)
NRBC: 0 % (ref 0.0–0.2)
Neutro Abs: 1.6 10*3/uL — ABNORMAL LOW (ref 1.7–7.7)
Neutrophils Relative %: 51 %
Platelet Count: 113 10*3/uL — ABNORMAL LOW (ref 150–400)
RBC: 2.27 MIL/uL — ABNORMAL LOW (ref 3.87–5.11)
RDW: 14.6 % (ref 11.5–15.5)
WBC Count: 3.2 10*3/uL — ABNORMAL LOW (ref 4.0–10.5)

## 2018-09-18 LAB — CMP (CANCER CENTER ONLY)
ALBUMIN: 4 g/dL (ref 3.5–5.0)
ALK PHOS: 89 U/L (ref 38–126)
ALT: 14 U/L (ref 0–44)
AST: 19 U/L (ref 15–41)
Anion gap: 8 (ref 5–15)
BILIRUBIN TOTAL: 0.4 mg/dL (ref 0.3–1.2)
BUN: 11 mg/dL (ref 8–23)
CO2: 29 mmol/L (ref 22–32)
CREATININE: 0.83 mg/dL (ref 0.44–1.00)
Calcium: 9.3 mg/dL (ref 8.9–10.3)
Chloride: 103 mmol/L (ref 98–111)
GFR, Est AFR Am: >60 mL/min (ref >60)
GLUCOSE: 105 mg/dL — AB (ref 70–99)
POTASSIUM: 4 mmol/L (ref 3.5–5.1)
Sodium: 140 mmol/L (ref 135–145)
Total Protein: 6.3 g/dL — ABNORMAL LOW (ref 6.5–8.1)

## 2018-09-18 NOTE — Telephone Encounter (Signed)
-----   Message from Heath Lark, MD sent at 09/18/2018  1:00 PM EDT ----- Regarding: labs borderline Tell her labs are borderline Continue Zejula 100 mg daily (pls change her med list) Please also schedule another labs only appt next Monday ----- Message ----- From: Interface, Lab In [place] Sent: 09/18/2018  11:53 AM EDT To: Heath Lark, MD

## 2018-09-18 NOTE — Telephone Encounter (Signed)
Called and given below message. She verbalized understanding. Scheduling message sent for labs.

## 2018-09-18 NOTE — Telephone Encounter (Signed)
Called and left a message asking her to call the office. 

## 2018-09-19 ENCOUNTER — Encounter (HOSPITAL_COMMUNITY): Payer: Medicare Other

## 2018-09-19 ENCOUNTER — Ambulatory Visit (HOSPITAL_COMMUNITY)
Admission: RE | Admit: 2018-09-19 | Discharge: 2018-09-19 | Disposition: A | Discharge status: Home / Self Care | Payer: Medicare Other | Source: Ambulatory Visit | Attending: Cardiology | Admitting: Cardiology

## 2018-09-19 ENCOUNTER — Telehealth: Payer: Self-pay | Admitting: Hematology and Oncology

## 2018-09-19 DIAGNOSIS — Z79899 Other long term (current) drug therapy: Secondary | ICD-10-CM | POA: Insufficient documentation

## 2018-09-19 DIAGNOSIS — I2510 Atherosclerotic heart disease of native coronary artery without angina pectoris: Secondary | ICD-10-CM | POA: Diagnosis present

## 2018-09-19 DIAGNOSIS — I480 Paroxysmal atrial fibrillation: Secondary | ICD-10-CM | POA: Insufficient documentation

## 2018-09-19 LAB — PULMONARY FUNCTION TEST
DL/VA % PRED: 89 %
DL/VA: 4.17 ml/min/mmHg/L
DLCO UNC: 14.72 ml/min/mmHg
DLCO cor % pred: 79 %
DLCO cor: 18.1 ml/min/mmHg
DLCO unc % pred: 64 %
FEF 25-75 Pre: 1.32 L/sec
FEF2575-%PRED-PRE: 82 %
FEV1-%Pred-Pre: 98 %
FEV1-PRE: 1.98 L
FEV1FVC-%Pred-Pre: 99 %
FEV6-%Pred-Pre: 103 %
FEV6-Pre: 2.64 L
FEV6FVC-%Pred-Pre: 105 %
FVC-%Pred-Pre: 98 %
FVC-PRE: 2.64 L
PRE FEV1/FVC RATIO: 75 %
Pre FEV6/FVC Ratio: 100 %
RV % pred: 93 %
RV: 2.1 L
TLC % PRED: 99 %
TLC: 4.87 L

## 2018-09-19 LAB — CA 125: Cancer Antigen (CA) 125: 35.1 U/mL (ref 0.0–38.1)

## 2018-09-19 MED ORDER — ALBUTEROL SULFATE (2.5 MG/3ML) 0.083% IN NEBU: 2.5000 mg | INHALATION_SOLUTION | Freq: Once | RESPIRATORY_TRACT | Status: DC

## 2018-09-19 NOTE — Telephone Encounter (Signed)
Spoke to pt regarding upcoming appts per 10/21 sch message.  °

## 2018-09-20 ENCOUNTER — Telehealth: Payer: Self-pay | Admitting: *Deleted

## 2018-09-20 NOTE — Telephone Encounter (Signed)
-----   Message from Leonie Man, MD sent at 09/19/2018  5:41 PM EDT ----- Relatively stable Lung Fxn Tests -- no sign of Amiodarone toxicity  Glenetta Hew, MD

## 2018-09-20 NOTE — Telephone Encounter (Signed)
LEFT MESSAGE TO CALL BACK FOR RESULTS OF PFT

## 2018-09-24 ENCOUNTER — Telehealth: Payer: Self-pay | Admitting: Pharmacist Clinician (PhC)/ Clinical Pharmacy Specialist

## 2018-09-24 DIAGNOSIS — E785 Hyperlipidemia, unspecified: Secondary | ICD-10-CM

## 2018-09-24 NOTE — Telephone Encounter (Signed)
Mailed new Safety Net application and lab order 

## 2018-09-25 ENCOUNTER — Telehealth: Payer: Self-pay

## 2018-09-25 ENCOUNTER — Other Ambulatory Visit: Payer: Self-pay | Admitting: Hematology and Oncology

## 2018-09-25 ENCOUNTER — Inpatient Hospital Stay: Payer: Medicare Other

## 2018-09-25 DIAGNOSIS — C569 Malignant neoplasm of unspecified ovary: Secondary | ICD-10-CM

## 2018-09-25 DIAGNOSIS — C561 Malignant neoplasm of right ovary: Secondary | ICD-10-CM | POA: Diagnosis not present

## 2018-09-25 DIAGNOSIS — Z7189 Other specified counseling: Secondary | ICD-10-CM

## 2018-09-25 LAB — CBC WITH DIFFERENTIAL (CANCER CENTER ONLY)
ABS IMMATURE GRANULOCYTES: 0.01 10*3/uL (ref 0.00–0.07)
BASOS PCT: 1 %
Basophils Absolute: 0 10*3/uL (ref 0.0–0.1)
Eosinophils Absolute: 0 10*3/uL (ref 0.0–0.5)
Eosinophils Relative: 1 %
HCT: 27.6 % — ABNORMAL LOW (ref 36.0–46.0)
Hemoglobin: 9.1 g/dL — ABNORMAL LOW (ref 12.0–15.0)
Immature Granulocytes: 0 %
Lymphocytes Relative: 34 %
Lymphs Abs: 1 10*3/uL (ref 0.7–4.0)
MCH: 37.8 pg — AB (ref 26.0–34.0)
MCHC: 33 g/dL (ref 30.0–36.0)
MCV: 114.5 fL — ABNORMAL HIGH (ref 80.0–100.0)
MONO ABS: 0.4 10*3/uL (ref 0.1–1.0)
Monocytes Relative: 12 %
Neutro Abs: 1.6 10*3/uL — ABNORMAL LOW (ref 1.7–7.7)
Neutrophils Relative %: 52 %
PLATELETS: 143 10*3/uL — AB (ref 150–400)
RBC: 2.41 MIL/uL — ABNORMAL LOW (ref 3.87–5.11)
RDW: 15.6 % — ABNORMAL HIGH (ref 11.5–15.5)
WBC: 3.1 10*3/uL — AB (ref 4.0–10.5)
nRBC: 0 % (ref 0.0–0.2)

## 2018-09-25 LAB — CMP (CANCER CENTER ONLY)
ALT: 14 U/L (ref 0–44)
ANION GAP: 6 (ref 5–15)
AST: 22 U/L (ref 15–41)
Albumin: 4.1 g/dL (ref 3.5–5.0)
Alkaline Phosphatase: 88 U/L (ref 38–126)
BUN: 10 mg/dL (ref 8–23)
CHLORIDE: 102 mmol/L (ref 98–111)
CO2: 31 mmol/L (ref 22–32)
Calcium: 9.2 mg/dL (ref 8.9–10.3)
Creatinine: 0.86 mg/dL (ref 0.44–1.00)
GFR, Estimated: >60 mL/min (ref >60)
Glucose, Bld: 107 mg/dL — ABNORMAL HIGH (ref 70–99)
Potassium: 3.9 mmol/L (ref 3.5–5.1)
SODIUM: 139 mmol/L (ref 135–145)
Total Bilirubin: 0.5 mg/dL (ref 0.3–1.2)
Total Protein: 6.6 g/dL (ref 6.5–8.1)

## 2018-09-25 NOTE — Telephone Encounter (Signed)
I called this patient and left a message letting her know that her labs are stable and she is to continue her Zejula.

## 2018-10-02 ENCOUNTER — Telehealth: Payer: Self-pay | Admitting: Cardiology

## 2018-10-02 ENCOUNTER — Inpatient Hospital Stay: Payer: Medicare Other | Attending: Gynecology

## 2018-10-02 ENCOUNTER — Telehealth: Payer: Self-pay

## 2018-10-02 DIAGNOSIS — C561 Malignant neoplasm of right ovary: Secondary | ICD-10-CM | POA: Insufficient documentation

## 2018-10-02 DIAGNOSIS — C569 Malignant neoplasm of unspecified ovary: Secondary | ICD-10-CM

## 2018-10-02 DIAGNOSIS — Z7189 Other specified counseling: Secondary | ICD-10-CM

## 2018-10-02 LAB — CBC WITH DIFFERENTIAL (CANCER CENTER ONLY)
Abs Immature Granulocytes: 0.01 10*3/uL (ref 0.00–0.07)
Basophils Absolute: 0 10*3/uL (ref 0.0–0.1)
Basophils Relative: 1 %
EOS PCT: 1 %
Eosinophils Absolute: 0 10*3/uL (ref 0.0–0.5)
HEMATOCRIT: 28.8 % — AB (ref 36.0–46.0)
HEMOGLOBIN: 9.6 g/dL — AB (ref 12.0–15.0)
Immature Granulocytes: 0 %
LYMPHS ABS: 1.2 10*3/uL (ref 0.7–4.0)
Lymphocytes Relative: 34 %
MCH: 37.8 pg — AB (ref 26.0–34.0)
MCHC: 33.3 g/dL (ref 30.0–36.0)
MCV: 113.4 fL — ABNORMAL HIGH (ref 80.0–100.0)
MONO ABS: 0.4 10*3/uL (ref 0.1–1.0)
Monocytes Relative: 12 %
Neutro Abs: 1.8 10*3/uL (ref 1.7–7.7)
Neutrophils Relative %: 52 %
Platelet Count: 126 10*3/uL — ABNORMAL LOW (ref 150–400)
RBC: 2.54 MIL/uL — ABNORMAL LOW (ref 3.87–5.11)
RDW: 15.4 % (ref 11.5–15.5)
WBC Count: 3.4 10*3/uL — ABNORMAL LOW (ref 4.0–10.5)
nRBC: 0 % (ref 0.0–0.2)

## 2018-10-02 LAB — CMP (CANCER CENTER ONLY)
ALT: 16 U/L (ref 0–44)
AST: 24 U/L (ref 15–41)
Albumin: 4.1 g/dL (ref 3.5–5.0)
Alkaline Phosphatase: 89 U/L (ref 38–126)
Anion gap: 8 (ref 5–15)
BUN: 10 mg/dL (ref 8–23)
CO2: 29 mmol/L (ref 22–32)
CREATININE: 0.88 mg/dL (ref 0.44–1.00)
Calcium: 9.5 mg/dL (ref 8.9–10.3)
Chloride: 102 mmol/L (ref 98–111)
GFR, Est AFR Am: >60 mL/min (ref >60)
Glucose, Bld: 88 mg/dL (ref 70–99)
Potassium: 4.3 mmol/L (ref 3.5–5.1)
SODIUM: 139 mmol/L (ref 135–145)
Total Bilirubin: 0.6 mg/dL (ref 0.3–1.2)
Total Protein: 6.6 g/dL (ref 6.5–8.1)

## 2018-10-02 NOTE — Telephone Encounter (Signed)
Returned call to patient, she states she received a lab order in the mail and was wondering if we do labs in the office.  Advised we do have a lab and she does not need an appt.    Per chart review this is for a Lipid panel.   Advised to fast and lab opens at 8 AM M-F.  Patient states she will come tomorrow morning.

## 2018-10-02 NOTE — Telephone Encounter (Signed)
Reviewed today's lab results with [name], Pharmacist okay to continue Zejula.  Called and left a message that labs look good and continue Zejula and call the office for questions.

## 2018-10-02 NOTE — Telephone Encounter (Signed)
New message:   Patient calling about some lab orders.

## 2018-10-03 LAB — LIPID PANEL
CHOL/HDL RATIO: 1.8 ratio (ref 0.0–4.4)
Cholesterol, Total: 155 mg/dL (ref 100–199)
HDL: 84 mg/dL (ref >39)
LDL CALC: 55 mg/dL (ref 0–99)
TRIGLYCERIDES: 80 mg/dL (ref 0–149)
VLDL Cholesterol Cal: 16 mg/dL (ref 5–40)

## 2018-10-03 NOTE — Telephone Encounter (Signed)
Spoke to patient - results given @ 09/21/18

## 2018-10-04 ENCOUNTER — Telehealth: Payer: Self-pay | Admitting: *Deleted

## 2018-10-04 NOTE — Telephone Encounter (Signed)
-----   Message from Leonie Man, MD sent at 10/03/2018  5:05 PM EST ----- Cholesterol looks much better on new cholesterol medication.  Continue to follow-up with [facility]  Glenetta Hew, MD

## 2018-10-04 NOTE — Telephone Encounter (Signed)
Left message to call back - lipid panel results

## 2018-10-05 NOTE — Telephone Encounter (Signed)
Pt update with Lab results along with Dr. Allison Quarry recommendation.

## 2018-10-05 NOTE — Telephone Encounter (Signed)
Follow up  ° ° °Patient returning call from yesterday  °

## 2018-10-16 ENCOUNTER — Telehealth: Payer: Self-pay | Admitting: Oncology

## 2018-10-16 ENCOUNTER — Inpatient Hospital Stay: Payer: Medicare Other

## 2018-10-16 DIAGNOSIS — Z7189 Other specified counseling: Secondary | ICD-10-CM

## 2018-10-16 DIAGNOSIS — C569 Malignant neoplasm of unspecified ovary: Secondary | ICD-10-CM

## 2018-10-16 DIAGNOSIS — C561 Malignant neoplasm of right ovary: Secondary | ICD-10-CM | POA: Diagnosis not present

## 2018-10-16 LAB — CBC WITH DIFFERENTIAL (CANCER CENTER ONLY)
Abs Immature Granulocytes: 0.01 10*3/uL (ref 0.00–0.07)
BASOS ABS: 0 10*3/uL (ref 0.0–0.1)
Basophils Relative: 1 %
EOS PCT: 1 %
Eosinophils Absolute: 0 10*3/uL (ref 0.0–0.5)
HCT: 30.7 % — ABNORMAL LOW (ref 36.0–46.0)
Hemoglobin: 10.1 g/dL — ABNORMAL LOW (ref 12.0–15.0)
IMMATURE GRANULOCYTES: 0 %
LYMPHS ABS: 1.1 10*3/uL (ref 0.7–4.0)
Lymphocytes Relative: 35 %
MCH: 37.8 pg — ABNORMAL HIGH (ref 26.0–34.0)
MCHC: 32.9 g/dL (ref 30.0–36.0)
MCV: 115 fL — ABNORMAL HIGH (ref 80.0–100.0)
Monocytes Absolute: 0.4 10*3/uL (ref 0.1–1.0)
Monocytes Relative: 14 %
NEUTROS ABS: 1.5 10*3/uL — AB (ref 1.7–7.7)
NEUTROS PCT: 49 %
NRBC: 0 % (ref 0.0–0.2)
Platelet Count: 147 10*3/uL — ABNORMAL LOW (ref 150–400)
RBC: 2.67 MIL/uL — AB (ref 3.87–5.11)
RDW: 15.4 % (ref 11.5–15.5)
WBC: 3.1 10*3/uL — AB (ref 4.0–10.5)

## 2018-10-16 LAB — CMP (CANCER CENTER ONLY)
ALBUMIN: 4.2 g/dL (ref 3.5–5.0)
ALK PHOS: 93 U/L (ref 38–126)
ALT: 14 U/L (ref 0–44)
ANION GAP: 8 (ref 5–15)
AST: 23 U/L (ref 15–41)
BILIRUBIN TOTAL: 0.5 mg/dL (ref 0.3–1.2)
BUN: 14 mg/dL (ref 8–23)
CO2: 32 mmol/L (ref 22–32)
Calcium: 9.6 mg/dL (ref 8.9–10.3)
Chloride: 102 mmol/L (ref 98–111)
Creatinine: 0.87 mg/dL (ref 0.44–1.00)
GFR, Est AFR Am: >60 mL/min (ref >60)
GFR, Estimated: >60 mL/min (ref >60)
GLUCOSE: 91 mg/dL (ref 70–99)
POTASSIUM: 4 mmol/L (ref 3.5–5.1)
SODIUM: 142 mmol/L (ref 135–145)
TOTAL PROTEIN: 6.7 g/dL (ref 6.5–8.1)

## 2018-10-16 NOTE — Telephone Encounter (Signed)
Called [name] and advised her that she can continue Zejula per Thayer [place], Pharmacist.  She verbalized understanding and agreement.

## 2018-10-17 LAB — CA 125: Cancer Antigen (CA) 125: 49 U/mL — ABNORMAL HIGH (ref 0.0–38.1)

## 2018-10-31 ENCOUNTER — Encounter: Payer: Self-pay | Admitting: Cardiology

## 2018-10-31 ENCOUNTER — Ambulatory Visit: Payer: Medicare Other | Admitting: Cardiology

## 2018-10-31 VITALS — BP 132/84 | HR 83 | Ht 63.0 in | Wt 132.8 lb

## 2018-10-31 DIAGNOSIS — E785 Hyperlipidemia, unspecified: Secondary | ICD-10-CM | POA: Diagnosis not present

## 2018-10-31 DIAGNOSIS — I10 Essential (primary) hypertension: Secondary | ICD-10-CM

## 2018-10-31 DIAGNOSIS — Z79899 Other long term (current) drug therapy: Secondary | ICD-10-CM

## 2018-10-31 DIAGNOSIS — I2510 Atherosclerotic heart disease of native coronary artery without angina pectoris: Secondary | ICD-10-CM

## 2018-10-31 DIAGNOSIS — I480 Paroxysmal atrial fibrillation: Secondary | ICD-10-CM

## 2018-10-31 NOTE — Progress Notes (Signed)
PCP: Shon Baton, MD  Clinic Note: Chief Complaint  Patient presents with  . Follow-up    Doing well.  No complaints  . Atrial Fibrillation    No recurrent symptoms  . Coronary Artery Disease    Branch vessel, no angina    HPI: DEINA LIPSEY is a 77 y.o. female with a PMH notable for small vessel CAD-medical management, PAF on amiodarone and Eliquis who presents today for annual follow-up.Marland Kitchen  AVRIANNA SMART was last seen on Nov 15,2018 --was doing well without any complaints.  Occasional skipping beats but nothing significant.  Nothing more than 2 minutes.  Back on PCSK9 inhibitor.  Recent Hospitalizations:   None  Studies Personally Reviewed - (if available, images/films reviewed: From Epic Chart or Care Everywhere)  None  Interval History: Liliana returns here today overall doing well.  She is not currently on any blood pressure medications and is only on amiodarone for rate/rhythm control of A. fib.  She has not noticed any symptoms to suggest recurrence of A. fib, just some intermittent palpitations that are fleeting lasting less than a minute at a time.  She is not having any resting exertional chest pain or pressure.  No heart failure symptoms of PND, orthopnea or edema.  She is still doing intermittent chemotherapy.  But she is noticed over the last several months is that she is having more balance issues and back and hip pain.  She is now walking with a cane.  As a result, she is not exercising as much as he used to. No claudication.  No syncope/near syncope or TIA/amaurosis fugax.  No melena, hematochezia, hematuria, or epstaxis.  ROS: A comprehensive was performed. Review of Systems  Constitutional: Negative for malaise/fatigue.  HENT: Negative for congestion.   Respiratory: Negative for cough and shortness of breath.   Gastrointestinal: Negative for heartburn.  Musculoskeletal: Positive for back pain and joint pain (Hip). Negative for falls (Not recently, but  a couple months ago.).  Neurological: Negative for dizziness (If she stands up too fast).  Endo/Heme/Allergies: Does not bruise/bleed easily.  Psychiatric/Behavioral: Negative for depression and memory loss. The patient does not have insomnia.   All other systems reviewed and are negative.  I have reviewed and (if needed) personally updated the patient's problem list, medications, allergies, past medical and surgical history, social and family history.   Past Medical History:  Diagnosis Date  . Arthritis    OSTEOARTHRITIS   -- CONSTANT PAIN RIGHT HIP---AND PAIN LEFT KNEE--PT STATES SHE GETS INJECTIONS INTO HER KNEE  . Complication of anesthesia    BLOOD PRESSURE DROPPED WITH NASAL SURGERY, ONE OF THE CARPAL TUNNEL REPAIRS AND DURING A COLONOSCOPY  . Dyslipidemia   . History of skin cancer   . Hypertension   . Hypothyroidism   . Menopausal symptoms   . NSTEMI (non-ST elevated myocardial infarction) ([place]) 12/09/15   Medical management: Distal branch of D1 95%, ostial D2 75%, distal LAD 50%. Tortuous arteries consistent with hypertension  . Ovarian cancer ([place]) 01/2013   Recurrence since 2014/2016  . PAF (paroxysmal atrial fibrillation) ([place])    On ELIQUIS; On Amiodarone (Eye Exam 12/25/15) - no longer on Rythmol  . Stroke [facility]) 2017   no deficits    Past Surgical History:  Procedure Laterality Date  . BILATERAL CARPAL TUNNEL REPAIR  2007  . BREAST BIOPSY Left 03/19/2014   benign  . CARDIAC CATHETERIZATION N/A 12/05/2015   Procedure: Left Heart Cath and Coronary Angiography;  Surgeon:  Belva Crome, MD;  Location: [facility];  Service: Cardiovascular;  distal branch of D1 95%, ostial D2 75%, dLAD 50%,p-mRCA 40%. Tortuous vessels.  Marland Kitchen DILATION AND CURETTAGE OF UTERUS  1969  . JOINT REPLACEMENT    . LAPAROTOMY Bilateral 02/13/2013   Procedure: EXPLORATORY LAPAROTOMY TOTAL ABDOMINAL HYSTERECTOMY BILATERAL SALPINGO-OOPHORECTOMY, Partial Rectal Resection with Reanastamosis;  Surgeon:  Alvino Chapel, MD;  Location: [facility];  Service: Gynecology;  Laterality: Bilateral;  . LEFT KNEE ARTHROSCOPY   2011  . LYMPHADENECTOMY Right 02/13/2013   Procedure: PEVLIC  LYMPHADENECTOMY, DEBULKING right pelvic tumor nodules;  Surgeon: Alvino Chapel, MD;  Location: [facility];  Service: Gynecology;  Laterality: Right;  . NM MYOVIEW LTD  June 2010    subbmaximal with no ischemia or infarction.  . OMENTECTOMY  02/13/2013   Procedure: OMENTECTOMY;  Surgeon: Alvino Chapel, MD;  Location: [facility];  Service: Gynecology;;  . [place]  . SURGERY FOR RUPTURED OVARIAN CYST  1969  . SYNOVECTOMY WITH POLY EXCHANGE Left 03/18/2017   Procedure: LEFT KNEE PARTIAL SYNOVECTOMY WITH POLY EXCHANGE;  Surgeon: Mcarthur Rossetti, MD;  Location: [facility];  Service: Orthopedics;  Laterality: Left;  Adductor Block  . TAH/BSO/Tumor debulking with right pelvic LND  01/2013  . TONSILLECTOMY  1962  . TOTAL HIP ARTHROPLASTY  02/25/2012   Procedure: TOTAL HIP ARTHROPLASTY ANTERIOR APPROACH;  Surgeon: Mcarthur Rossetti, MD;  Location: [facility];  Service: Orthopedics;  Laterality: Right;  . TOTAL KNEE ARTHROPLASTY Left 01/03/2015   Procedure: LEFT TOTAL KNEE ARTHROPLASTY;  Surgeon: Mcarthur Rossetti, MD;  Location: [facility];  Service: Orthopedics;  Laterality: Left;  . TRANSTHORACIC ECHOCARDIOGRAM  May 2014; January 2016   a. Normal LV size function. EF 60-65%. Grade 1 diastolic function. Mild MR and mildly elevated PA pressures of 37 mmHg per;; b. EF 60-65%. Mobile echodensity 10 mm x 6 mm attest interventricular septum is questionable fibroblastoma.  Marland Kitchen TRIGGER FINGER RELEASE Left 12/20/2017   Procedure: RELEASE TRIGGER FINGER/A-1 PULLEY LEFT RING FINGER;  Surgeon: Daryll Brod, MD;  Location: [facility];  Service: Orthopedics;  Laterality: Left;    Current Meds  Medication Sig  . acetaminophen (TYLENOL) 500 MG tablet Take 1,000 mg by mouth every 6  (six) hours as needed for mild pain.   Marland Kitchen amiodarone (PACERONE) 200 MG tablet Take 100 mg by mouth daily.  Marland Kitchen amiodarone (PACERONE) 200 MG tablet TAKE 1/2 TABLET BY MOUTH DAILY  . amoxicillin (AMOXIL) 500 MG capsule Take 2,000 mg by mouth See admin instructions. Takes 4 capsules 1 hour prior to procedure  . Biotin 10 MG TABS Take 20 mg by mouth daily.  Marland Kitchen desonide (DESOWEN) 0.05 % cream Apply 1 application topically 2 (two) times daily.   . diphenhydrAMINE (BENADRYL) 25 MG tablet Take 50 mg by mouth at bedtime as needed for sleep.   Marland Kitchen docusate sodium (COLACE) 100 MG capsule Take 250 mg by mouth 2 (two) times daily.   Marland Kitchen ELIQUIS 5 MG TABS tablet TAKE 1 TABLET BY MOUTH TWICE A DAY  . Evolocumab with Infusor 420 MG/3.5ML SOCT Inject into the skin.  Marland Kitchen ezetimibe (ZETIA) 10 MG tablet Take 1 tablet (10 mg total) by mouth daily. (Patient taking differently: Take 10 mg by mouth at bedtime. )  . Glucosamine HCl 1000 MG TABS Take 1,000 mg by mouth daily.  . hydrocortisone 1 % ointment Apply 1 application topically 2 (two) times daily as needed for  itching (eczema).   Marland Kitchen ipratropium (ATROVENT) 0.06 % nasal spray Place 2 sprays into both nostrils daily.   Marland Kitchen levothyroxine (SYNTHROID) 88 MCG tablet Take 88 mcg by mouth daily before breakfast.  . lidocaine-prilocaine (EMLA) cream Apply to Porta-Cath site 1-2 hours prior to access as directed.  . magnesium hydroxide (MILK OF MAGNESIA) 800 MG/5ML suspension Take 30 mLs by mouth daily as needed for constipation. Reported on 11/20/2015  . Melatonin 5 MG CAPS Take 5 mg by mouth at bedtime as needed (sleep). Reported on 12/25/2015  . Menthol, Topical Analgesic, (ICY HOT EX) Apply 1 application topically 2 (two) times daily as needed (PAIN).   . Multiple Vitamin (MULTIVITAMIN WITH MINERALS) TABS tablet Take 1 tablet by mouth daily. Centrum Silver  . Niraparib Tosylate (ZEJULA) 100 MG CAPS Take 100 mg by mouth daily.  . ondansetron (ZOFRAN) 8 MG tablet Take 1 tablet (8 mg  total) by mouth every 8 (eight) hours as needed for nausea or vomiting.  Marland Kitchen OVER THE COUNTER MEDICATION Co210 20 mg once a day for muscle aches.  Vladimir Faster Glycol-Propyl Glycol (SYSTANE OP) Place 1 drop into both eyes 2 (two) times daily.   . polyethylene glycol (MIRALAX / GLYCOLAX) packet Take 17 g by mouth daily. Mix in 8 oz liquid and drink  . potassium chloride (K-DUR) 10 MEQ tablet Take 10 mEq by mouth daily.  . prochlorperazine (COMPAZINE) 10 MG tablet Take 1 tablet (10 mg total) by mouth every 6 (six) hours as needed (Nausea or vomiting).  Marland Kitchen SHINGRIX injection     Allergies  Allergen Reactions  . Clonidine Derivatives Shortness Of Breath  . Coreg [Carvedilol] Shortness Of Breath  . Caffeine Other (See Comments)    Makes heart race  . Codeine Other (See Comments)    Does not like the feeling she gets  . Flexeril [Cyclobenzaprine] Other (See Comments)    Pt states "increased heart rate"  . Lipitor [Atorvastatin] Dermatitis    "feels like bugs are biting her"   . Lisinopril     LIP NUMBNESS  . Pravastatin Other (See Comments)    FEELS LIKE "BUG BITES" BITING HER LEGS   . Tape Rash    TEGADERM.   (use opsite on PAC)  . Tegaderm Ag Mesh [Silver] Rash and Other (See Comments)    Burns skin  . Ultram [Tramadol] Palpitations    Social History   Tobacco Use  . Smoking status: Never Smoker  . Smokeless tobacco: Never Used  Substance Use Topics  . Alcohol use: No    Alcohol/week: 0.0 standard drinks  . Drug use: No   Social History   Social History Narrative   Married mother of 3, grandmother 48.   Exercises 6/7 days a week walking 30 minutes a time.   Never smoked or drank alcohol.    family history includes AAA (abdominal aortic aneurysm) in her father and paternal grandmother; Basal cell carcinoma in her mother; Heart Problems in her maternal grandfather and maternal uncle; Hypertension in her mother; Other in her daughter; Prostate cancer in her other; Prostate  cancer (age of onset: 30) in her maternal uncle.  Wt Readings from Last 3 Encounters:  10/31/18 132 lb 12.8 oz (60.2 kg)  09/11/18 132 lb 3.2 oz (60 kg)  08/28/18 130 lb 12.8 oz (59.3 kg)    PHYSICAL EXAM BP 132/84   Pulse 83   Ht 5\' 3"  (1.6 m)   Wt 132 lb 12.8 oz (60.2 kg)   BMI  23.52 kg/m  Physical Exam  Constitutional: She is oriented to person, place, and time. She appears well-developed and well-nourished. No distress.  Well-groomed.  Walking with cane with some slight antalgic gait, but otherwise healthy-appearing  HENT:  Head: Normocephalic and atraumatic.  Neck: Normal range of motion. Neck supple. No hepatojugular reflux and no JVD present. Carotid bruit is not present.  Cardiovascular: Normal rate, regular rhythm, normal heart sounds and normal pulses.  Occasional extrasystoles are present. PMI is not displaced. Exam reveals no gallop and no friction rub.  No murmur heard. Musculoskeletal: Normal range of motion. She exhibits no edema.  Neurological: She is alert and oriented to person, place, and time.  Psychiatric: She has a normal mood and affect. Her behavior is normal. Judgment and thought content normal.  Vitals reviewed.    Adult ECG Report  Rate: 8. ;  Rhythm: normal sinus rhythm, premature atrial contractions (PAC) and Nonspecific ST-T wave changes.;   Narrative Interpretation: Stable EKG   Other studies Reviewed: Additional studies/ records that were reviewed today include:  Recent Labs:   Lab Results  Component Value Date   CHOL 155 10/03/2018   HDL 84 10/03/2018   LDLCALC 55 10/03/2018   TRIG 80 10/03/2018   CHOLHDL 1.8 10/03/2018   Lab Results  Component Value Date   CREATININE 0.87 10/16/2018   BUN 14 10/16/2018   NA 142 10/16/2018   K 4.0 10/16/2018   CL 102 10/16/2018   CO2 32 10/16/2018   ASSESSMENT / PLAN: Problem List Items Addressed This Visit    Atherosclerotic heart disease of native coronary artery without angina pectoris  (Chronic)    Has branch vessel disease..  Is on Repatha because of statin intolerance.  Not on beta-blocker since she is on amiodarone.  Not on aspirin because of Eliquis.  Doing well without any angina symptoms.      Relevant Orders   EKG 12-Lead (Completed)   Dyslipidemia, goal LDL below 70 (Chronic)   Essential hypertension (Chronic)    Actually her blood pressure looks quite good today.  He is not on any blood pressure medications at this point.  No longer on ARB or spironolactone.      On amiodarone therapy (Chronic)    Thyroid and hepatic function tests look good.  Stable pulmonary function test.  Needs annual eye exams as well. PFTs prior to next follow-up.  If stable, could probably then do every other year.      Relevant Orders   Pulmonary Function Test   PAF (paroxysmal atrial fibrillation) ([place]): Symptomatic - Rhythm control with Amiodarone. CHA2DS2Vasc Score 7: On Eliquis - Primary (Chronic)    Very symptomatic when in A. fib.  Therefore is on amiodarone for rhythm control.  Maintaining sinus rhythm.  Only few palpitations noted. Remains stable on Eliquis with no bleeding issues.  No change      Relevant Orders   EKG 12-Lead (Completed)   Pulmonary Function Test      I spent a total of 25 minutes with the patient and chart review. >  50% of the time was spent in direct patient consultation.   Current medicines are reviewed at length with the patient today.  (+/- concerns) none The following changes have been made:  none  Patient Instructions  Medication Instructions:  NOT NEEDED If you need a refill on your cardiac medications before your next appointment, please call your pharmacy.   Lab work: Not needed If you have labs (blood work) drawn  today and your tests are completely normal, you will receive your results only by: Marland Kitchen MyChart Message (if you have MyChart) OR . A paper copy in the mail If you have any lab test that is abnormal or we need to change your  treatment, we will call you to review the results.  Testing/Procedures: Schedule at [facility] in [place] ,[place] physician has recommended that you have a pulmonary function test. Pulmonary Function Tests are a group of tests that measure how well air moves in and out of your lungs.    Follow-Up: At [facility], you and your health needs are our priority.  As part of our continuing mission to provide you with exceptional heart care, we have created designated Provider Care Teams.  These Care Teams include your primary Cardiologist (physician) and Advanced Practice Providers (APPs -  Physician Assistants and Nurse Practitioners) who all work together to provide you with the care you need, when you need it. You will need a follow up appointment in 12 months.  Please call our office 2 months in advance to schedule this appointment.  You may see No primary care provider on file. or one of the following Advanced Practice Providers on your designated Care Team:   Rosaria Ferries, PA-C . Jory Sims, DNP, ANP  Any Other Special Instructions Will Be Listed Below (If Applicable).       Studies Ordered:   Orders Placed This Encounter  Procedures  . EKG 12-Lead  . Pulmonary Function Test      Glenetta Hew, M.D., M.S. Interventional Cardiologist   Pager # [phone] Phone # [phone] [address] [place], [place] [address]   Thank you for choosing Heartcare at [facility]!!

## 2018-10-31 NOTE — Patient Instructions (Signed)
Medication Instructions:  NOT NEEDED If you need a refill on your cardiac medications before your next appointment, please call your pharmacy.   Lab work: Not needed If you have labs (blood work) drawn today and your tests are completely normal, you will receive your results only by: Marland Kitchen MyChart Message (if you have MyChart) OR . A paper copy in the mail If you have any lab test that is abnormal or we need to change your treatment, we will call you to review the results.  Testing/Procedures: Schedule at [facility] in [place] ,[place] physician has recommended that you have a pulmonary function test. Pulmonary Function Tests are a group of tests that measure how well air moves in and out of your lungs.    Follow-Up: At [facility], you and your health needs are our priority.  As part of our continuing mission to provide you with exceptional heart care, we have created designated Provider Care Teams.  These Care Teams include your primary Cardiologist (physician) and Advanced Practice Providers (APPs -  Physician Assistants and Nurse Practitioners) who all work together to provide you with the care you need, when you need it. You will need a follow up appointment in 12 months.  Please call our office 2 months in advance to schedule this appointment.  You may see No primary care provider on file. or one of the following Advanced Practice Providers on your designated Care Team:   Rosaria Ferries, PA-C . Jory Sims, DNP, ANP  Any Other Special Instructions Will Be Listed Below (If Applicable).

## 2018-11-02 ENCOUNTER — Encounter: Payer: Self-pay | Admitting: Cardiology

## 2018-11-02 NOTE — Assessment & Plan Note (Signed)
Actually her blood pressure looks quite good today.  He is not on any blood pressure medications at this point.  No longer on ARB or spironolactone.

## 2018-11-02 NOTE — Assessment & Plan Note (Signed)
Very symptomatic when in A. fib.  Therefore is on amiodarone for rhythm control.  Maintaining sinus rhythm.  Only few palpitations noted. Remains stable on Eliquis with no bleeding issues.  No change

## 2018-11-02 NOTE — Assessment & Plan Note (Signed)
Has branch vessel disease..  Is on Repatha because of statin intolerance.  Not on beta-blocker since she is on amiodarone.  Not on aspirin because of Eliquis.  Doing well without any angina symptoms.

## 2018-11-02 NOTE — Assessment & Plan Note (Signed)
Thyroid and hepatic function tests look good.  Stable pulmonary function test.  Needs annual eye exams as well. PFTs prior to next follow-up.  If stable, could probably then do every other year.

## 2018-11-06 ENCOUNTER — Inpatient Hospital Stay: Payer: Medicare Other | Attending: Gynecology

## 2018-11-06 ENCOUNTER — Inpatient Hospital Stay: Payer: Medicare Other

## 2018-11-06 ENCOUNTER — Ambulatory Visit (HOSPITAL_COMMUNITY)
Admission: RE | Admit: 2018-11-06 | Discharge: 2018-11-06 | Disposition: A | Discharge status: Home / Self Care | Payer: Medicare Other | Source: Ambulatory Visit | Attending: Hematology and Oncology | Admitting: Hematology and Oncology

## 2018-11-06 DIAGNOSIS — I700 Atherosclerosis of aorta: Secondary | ICD-10-CM | POA: Diagnosis not present

## 2018-11-06 DIAGNOSIS — I2510 Atherosclerotic heart disease of native coronary artery without angina pectoris: Secondary | ICD-10-CM | POA: Diagnosis not present

## 2018-11-06 DIAGNOSIS — R2689 Other abnormalities of gait and mobility: Secondary | ICD-10-CM | POA: Diagnosis not present

## 2018-11-06 DIAGNOSIS — N8189 Other female genital prolapse: Secondary | ICD-10-CM | POA: Insufficient documentation

## 2018-11-06 DIAGNOSIS — R54 Age-related physical debility: Secondary | ICD-10-CM | POA: Insufficient documentation

## 2018-11-06 DIAGNOSIS — G620 Drug-induced polyneuropathy: Secondary | ICD-10-CM | POA: Insufficient documentation

## 2018-11-06 DIAGNOSIS — R9389 Abnormal findings on diagnostic imaging of other specified body structures: Secondary | ICD-10-CM | POA: Diagnosis not present

## 2018-11-06 DIAGNOSIS — R933 Abnormal findings on diagnostic imaging of other parts of digestive tract: Secondary | ICD-10-CM | POA: Insufficient documentation

## 2018-11-06 DIAGNOSIS — N8110 Cystocele, unspecified: Secondary | ICD-10-CM | POA: Insufficient documentation

## 2018-11-06 DIAGNOSIS — K5909 Other constipation: Secondary | ICD-10-CM | POA: Insufficient documentation

## 2018-11-06 DIAGNOSIS — D61818 Other pancytopenia: Secondary | ICD-10-CM | POA: Insufficient documentation

## 2018-11-06 DIAGNOSIS — Z5111 Encounter for antineoplastic chemotherapy: Secondary | ICD-10-CM | POA: Diagnosis present

## 2018-11-06 DIAGNOSIS — J479 Bronchiectasis, uncomplicated: Secondary | ICD-10-CM | POA: Diagnosis not present

## 2018-11-06 DIAGNOSIS — Z7189 Other specified counseling: Secondary | ICD-10-CM | POA: Insufficient documentation

## 2018-11-06 DIAGNOSIS — R531 Weakness: Secondary | ICD-10-CM | POA: Insufficient documentation

## 2018-11-06 DIAGNOSIS — C569 Malignant neoplasm of unspecified ovary: Secondary | ICD-10-CM | POA: Insufficient documentation

## 2018-11-06 DIAGNOSIS — T451X5S Adverse effect of antineoplastic and immunosuppressive drugs, sequela: Secondary | ICD-10-CM | POA: Diagnosis not present

## 2018-11-06 LAB — CMP (CANCER CENTER ONLY)
ALBUMIN: 4.3 g/dL (ref 3.5–5.0)
ALT: 12 U/L (ref 0–44)
AST: 23 U/L (ref 15–41)
Alkaline Phosphatase: 98 U/L (ref 38–126)
Anion gap: 9 (ref 5–15)
BUN: 12 mg/dL (ref 8–23)
CHLORIDE: 103 mmol/L (ref 98–111)
CO2: 29 mmol/L (ref 22–32)
CREATININE: 0.8 mg/dL (ref 0.44–1.00)
Calcium: 9.5 mg/dL (ref 8.9–10.3)
GFR, Est AFR Am: >60 mL/min (ref >60)
GFR, Estimated: >60 mL/min (ref >60)
GLUCOSE: 91 mg/dL (ref 70–99)
Potassium: 3.8 mmol/L (ref 3.5–5.1)
Sodium: 141 mmol/L (ref 135–145)
TOTAL PROTEIN: 6.8 g/dL (ref 6.5–8.1)
Total Bilirubin: 0.6 mg/dL (ref 0.3–1.2)

## 2018-11-06 LAB — CBC WITH DIFFERENTIAL (CANCER CENTER ONLY)
Abs Immature Granulocytes: 0.01 10*3/uL (ref 0.00–0.07)
BASOS ABS: 0 10*3/uL (ref 0.0–0.1)
BASOS PCT: 0 %
Eosinophils Absolute: 0 10*3/uL (ref 0.0–0.5)
Eosinophils Relative: 1 %
HCT: 31.5 % — ABNORMAL LOW (ref 36.0–46.0)
Hemoglobin: 10.4 g/dL — ABNORMAL LOW (ref 12.0–15.0)
Immature Granulocytes: 0 %
Lymphocytes Relative: 34 %
Lymphs Abs: 1.1 10*3/uL (ref 0.7–4.0)
MCH: 37.7 pg — ABNORMAL HIGH (ref 26.0–34.0)
MCHC: 33 g/dL (ref 30.0–36.0)
MCV: 114.1 fL — ABNORMAL HIGH (ref 80.0–100.0)
Monocytes Absolute: 0.4 10*3/uL (ref 0.1–1.0)
Monocytes Relative: 13 %
NEUTROS PCT: 52 %
Neutro Abs: 1.7 10*3/uL (ref 1.7–7.7)
Platelet Count: 127 10*3/uL — ABNORMAL LOW (ref 150–400)
RBC: 2.76 MIL/uL — ABNORMAL LOW (ref 3.87–5.11)
RDW: 13.3 % (ref 11.5–15.5)
WBC Count: 3.3 10*3/uL — ABNORMAL LOW (ref 4.0–10.5)
nRBC: 0 % (ref 0.0–0.2)

## 2018-11-06 MED ORDER — SODIUM CHLORIDE (PF) 0.9 % IJ SOLN
INTRAMUSCULAR | Status: AC
  Filled 2018-11-06: qty 50

## 2018-11-06 MED ORDER — HEPARIN SOD (PORK) LOCK FLUSH 100 UNIT/ML IV SOLN
500.0000 [IU] | Freq: Once | INTRAVENOUS | Status: AC
  Administered 2018-11-06: 500 [IU] via INTRAVENOUS

## 2018-11-06 MED ORDER — HEPARIN SOD (PORK) LOCK FLUSH 100 UNIT/ML IV SOLN
INTRAVENOUS | Status: AC
  Filled 2018-11-06: qty 5

## 2018-11-06 MED ORDER — SODIUM CHLORIDE 0.9% FLUSH
10.0000 mL | Freq: Once | INTRAVENOUS | Status: AC
  Administered 2018-11-06: 10 mL
  Filled 2018-11-06: qty 10

## 2018-11-06 MED ORDER — IOHEXOL 300 MG/ML  SOLN
100.0000 mL | Freq: Once | INTRAMUSCULAR | Status: AC | PRN
  Administered 2018-11-06: 100 mL via INTRAVENOUS

## 2018-11-06 NOTE — Patient Instructions (Signed)
Implanted Port Home Guide An implanted port is a type of central line that is placed under the skin. Central lines are used to provide IV access when treatment or nutrition needs to be given through a person's veins. Implanted ports are used for long-term IV access. An implanted port may be placed because:  You need IV medicine that would be irritating to the small veins in your hands or arms.  You need long-term IV medicines, such as antibiotics.  You need IV nutrition for a long period.  You need frequent blood draws for lab tests.  You need dialysis.  Implanted ports are usually placed in the chest area, but they can also be placed in the upper arm, the abdomen, or the leg. An implanted port has two main parts:  Reservoir. The reservoir is round and will appear as a small, raised area under your skin. The reservoir is the part where a needle is inserted to give medicines or draw blood.  Catheter. The catheter is a thin, flexible tube that extends from the reservoir. The catheter is placed into a large vein. Medicine that is inserted into the reservoir goes into the catheter and then into the vein.  How will I care for my incision site? Do not get the incision site wet. Bathe or shower as directed by your health care provider. How is my port accessed? Special steps must be taken to access the port:  Before the port is accessed, a numbing cream can be placed on the skin. This helps numb the skin over the port site.  Your health care provider uses a sterile technique to access the port. ? Your health care provider must put on a mask and sterile gloves. ? The skin over your port is cleaned carefully with an antiseptic and allowed to dry. ? The port is gently pinched between sterile gloves, and a needle is inserted into the port.  Only "non-coring" port needles should be used to access the port. Once the port is accessed, a blood return should be checked. This helps ensure that the port  is in the vein and is not clogged.  If your port needs to remain accessed for a constant infusion, a clear (transparent) bandage will be placed over the needle site. The bandage and needle will need to be changed every week, or as directed by your health care provider.  Keep the bandage covering the needle clean and dry. Do not get it wet. Follow your health care provider's instructions on how to take a shower or bath while the port is accessed.  If your port does not need to stay accessed, no bandage is needed over the port.  What is flushing? Flushing helps keep the port from getting clogged. Follow your health care provider's instructions on how and when to flush the port. Ports are usually flushed with saline solution or a medicine called heparin. The need for flushing will depend on how the port is used.  If the port is used for intermittent medicines or blood draws, the port will need to be flushed: ? After medicines have been given. ? After blood has been drawn. ? As part of routine maintenance.  If a constant infusion is running, the port may not need to be flushed.  How long will my port stay implanted? The port can stay in for as long as your health care provider thinks it is needed. When it is time for the port to come out, surgery will be   done to remove it. The procedure is similar to the one performed when the port was put in. When should I seek immediate medical care? When you have an implanted port, you should seek immediate medical care if:  You notice a bad smell coming from the incision site.  You have swelling, redness, or drainage at the incision site.  You have more swelling or pain at the port site or the surrounding area.  You have a fever that is not controlled with medicine.  This information is not intended to replace advice given to you by your health care provider. Make sure you discuss any questions you have with your health care provider. Document  Released: 11/15/2005 Document Revised: 04/22/2016 Document Reviewed: 07/23/2013 Elsevier Interactive Patient Education  2017 Elsevier Inc.  

## 2018-11-07 ENCOUNTER — Encounter: Payer: Self-pay | Admitting: Hematology and Oncology

## 2018-11-07 ENCOUNTER — Inpatient Hospital Stay: Payer: Medicare Other | Admitting: Hematology and Oncology

## 2018-11-07 ENCOUNTER — Telehealth: Payer: Self-pay

## 2018-11-07 DIAGNOSIS — Z7189 Other specified counseling: Secondary | ICD-10-CM

## 2018-11-07 DIAGNOSIS — Z5111 Encounter for antineoplastic chemotherapy: Secondary | ICD-10-CM | POA: Diagnosis not present

## 2018-11-07 DIAGNOSIS — K5909 Other constipation: Secondary | ICD-10-CM | POA: Diagnosis not present

## 2018-11-07 DIAGNOSIS — C569 Malignant neoplasm of unspecified ovary: Secondary | ICD-10-CM | POA: Diagnosis not present

## 2018-11-07 DIAGNOSIS — D61818 Other pancytopenia: Secondary | ICD-10-CM | POA: Diagnosis not present

## 2018-11-07 DIAGNOSIS — R54 Age-related physical debility: Secondary | ICD-10-CM | POA: Diagnosis not present

## 2018-11-07 LAB — CA 125: Cancer Antigen (CA) 125: 61.5 U/mL — ABNORMAL HIGH (ref 0.0–38.1)

## 2018-11-07 NOTE — Assessment & Plan Note (Addendum)
I have reviewed imaging study and blood work with the patient and family I have also discussed the case with her GYN oncologist The patient has progressed on multiple antiestrogen therapy She is frail and had difficulties tolerating single agent carboplatin We discussed treatment options We discussed potential treatment with repeat treatment with single agent carboplatin again, weekly Taxol or others. The patient has indicated her desire to proceed with treatment She wants to be aggressive with treatment.  Combination chemotherapy such as carboplatin with gemcitabine or carboplatin with Taxol are possible but that would increase the risk of side effects After a lot of discussion, she is undecided She will call me next week after discussion with family members She is advised to stop niraparib.

## 2018-11-07 NOTE — Progress Notes (Signed)
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 The patient has indicated her desire to proceed with treatment She wants to be aggressive with treatment.  Combination chemotherapy such as carboplatin with gemcitabine or carboplatin with Taxol are possible but that would increase the risk of side effects After a lot of discussion, she is undecided She will call me next week after discussion with family members She is advised to stop niraparib.  Pancytopenia, acquired [facility]) She has mild persistent pancytopenia due to recent niraparib I recommend discontinuation of niraparib we will switch her treatment back to chemotherapy  Other constipation Imaging studies showed significant fecal loading We discussed importance of aggressive laxative therapy  Goals of care, counseling/discussion The patient is aware she has incurable disease and treatment is strictly palliative. We discussed importance of Advanced Directives and Living will. She has advanced directives and living will at home.  Her husband, Levada Dy is the medical healthcare power of attorney    No orders of the defined types were placed in this encounter.   INTERVAL HISTORY: Please see below for problem oriented charting. She returns today for further follow-up and to review test results. She feels well.  She continues to have chronic constipation, stable She also perceives mild worsening neuropathy  recently exacerbated by cold weather Denies recent infection, fever or chills.  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  Surgical consultation is advised.  No findings to suggest metastatic disease in the abdomen/pelvis    02/13/2013 Pathology Results    1. Adnexa - ovary +/- tube, neoplastic - HIGH GRADE OVARIAN POORLY DIFFERENTIATED CARCINOMA WITH ASSOCIATED TUMOR NECROSIS, 11.5 CM, INVOLVING THE OVARIAN CAPSULE AND ADJACENT FALLOPIAN TUBE. - PLEASE SEE ONCOLOGY TEMPLATE FOR DETAILS. 2. Ovary and fallopian tube, left - HIGH GRADE OVARIAN POORLY DIFFERENTIATED CARCINOMA WITH ASSOCIATED TUMOR NECROSIS, 2.3 CM, INVOLVING THE OVARIAN CAPSULE. - FALLOPIAN TUBAL TISSUE, NO EVIDENCE OF MALIGNANCY. - PLEASE SEE ONCOLOGY TEMPLATE FOR DETAILS. 3. Uterus and cervix - ENDOMETRIUM: BENIGN WEAKLY PROLIFERATIVE ENDOMETRIUM, NO ATYPIA, HYPERPLASIA OR MALIGNANCY. - CERVIX: BENIGN SQUAMOUS MUCOSA AND ENDOCERVICAL MUCOSA, NO DYSPLASIA OR MALIGNANCY. - UTERINE SEROSA: ADHESIONS, NO EVIDENCE OF ENDOMETRIOSIS, ATYPIA OR MALIGNANCY. 4. Cul-de-sac biopsy - POSITIVE FOR METASTATIC CARCINOMA (2.0 CM). 5. Soft tissue mass, simple excision, peri rectal - POSITIVE FOR METASTATIC CARCINOMA (2.6 CM). 6. Lymph nodes, regional resection, right pelvic - SEVEN LYMPH NODES, NEGATIVE  FOR  METASTATIC CARCINOMA (0/7). 7. Omentum, resection for tumor - MATURE ADIPOSE TISSUE, NO EVIDENCE OF MALIGNANCY. Microscopic Comment 1. OVARY Specimen(s): Right and left ovaries and fallopian tubes, uterus and cervix Procedure(s): Total hysterectomy, bilateral salpingo-oophorectomy Lymph node sampling performed: Yes Primary tumor site: Right and left ovaries Ovarian surface involvement: Present Maximum tumor size (cm): Right ovary: 11.5 cm; left ovary: 2.3 cm, gross measurement. 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 please see comment. Grade: High grade Peritoneal implants: No Pelvic extension: Cul-de-sac biopsy and perirectal soft tissue biopsy: positive Peritoneal washings: Rare cluster of atypical cells present ([id number]) Lymph nodes: number examined 7 ; number positive 0 TNM code: pT2c, pN0 FIGO Stage (based on pathologic findings, needs clinical correlation): II B (2014 edition) Comments: Sections of bilateral ovaries show sheets and nests of malignant epithelium resembling transitional cell carcinoma with associated tumor necrosis. No evidence of Brenner's tumor or conventional endometrioid carcinoma is identified in the background. The bilateral ovarian capsules are involved. Immunohistochemical stains were performed and the malignant cells are strongly positive for cytokeratin 7, WT-1, p16 and p53, negative for cytokeratin 20. 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 The tumor involves the right fallopian tube. Both cul-de-sac and perirectal soft tissue biopsies are positive for metastatic carcinoma. The omentum biopsy is negative for tumor.     03/20/2013 - 08/14/2013 Chemotherapy    The patient had 6 cycles of carboplatin and taxol    09/25/2013 Imaging    1. Interval resection of previously described right  pelvic mass. No evidence of recurrent or metastatic disease. 2. Small hiatal hernia. 3. Moderate amount of stool within the rectum. Question constipation or even mild fecal impaction.    04/03/2015 PET scan    1. Highly hypermetabolic height bowed dense mass in the medial spleen compatible with malignancy. 2. Bilateral pelvic sidewall adenopathy, right greater than left, hypermetabolic, with small peritoneal implants of tumor in the lower pelvis, and with several faint but suspected omental implants of tumor. There is also a hypermetabolic aortocaval lymph node in the retroperitoneum. 3. Generalized increased thyroid activity could be due to thyroiditis. Although a well-defined nodule is not seen in the right thyroid lobe, there is focal increased hypermetabolic activity in the right thyroid lobe. Thyroid ultrasound is recommended for further investigation. 4. I suspect that the high activity between the spinous processes of L3 and L4 is degenerative.    05/01/2015 - 10/21/2015 Chemotherapy    The patient had 8 cycles of carboplatin, taxol and Avastin.      07/02/2015 Imaging    1. Decrease in size of these splenic lesion. Suspect tumor also involving the adjacent posterior wall of the stomach. 2. Improved pelvic lymphadenopathy. 3. No new omental or peritoneal surface disease.     09/10/2015 Imaging    1. Response to therapy, with further decreased size of a medial splenic lesion. The previously questioned extension into the stomach is no longer identified and may have been artifactual on the prior exam. Of note, an inferior splenic low-density capsular based lesion is felt to be similar and warrants followup attention. 2. No adenopathy or new peritoneal disease identified. 3.  Possible constipation. 4.  Advanced aortic and branch vessel atherosclerosis.    11/06/2015 Imaging    1. Mass arising from the medial spleen and involving the posterior wall of stomach demonstrates mild decrease in size  in the interval.  2. Interval improvement and previously referenced pathologic iliac lymph nodes. 3. Previously referenced peritoneal implants within the pelvis are no longer measurable. No new areas of peritoneal metastasis is identified. 4. There is a right hilar node which is borderline enlarged but is increased in size when compared with 04/19/2013. Attention on follow-up examination is recommended.    11/18/2015 PET scan    1. Residual hypermetabolic tumor implant between the stomach and spleen, significantly decreased in size and mildly decreased in metabolism compared to 04/03/2015 PET-CT, and slightly decreased in size since 11/06/2015. 2. Residual hypermetabolic right deep pelvic side wall lymph node, with partial treatment response compared to 04/03/2015 PET-CT. 3. No new sites of hypermetabolic metastatic disease. 4. Stable diffuse thyroid hypermetabolism without discrete thyroid nodule, most suggestive of diffuse thyroiditis. Recommend correlation with serum thyroid function tests.     01/28/2016 - 11/26/2016 Anti-estrogen oral therapy    She was placed on Letrozole, stopped due to poor tolerance    02/09/2016 Imaging    1. Continued decrease in size of peritoneal lesion between the posterior wall of stomach and spleen. 2. No new or progressive disease identified    06/23/2016 Imaging    1. Near complete resolution of subtle residual soft tissue density lesion in the gastrosplenic ligament. 2. No new sites of disease identified. 3. Stool burden suggests constipation or even fecal impaction    12/01/2016 Imaging    1. No CT findings for residual or recurrent abdominal/pelvic ovarian cancer. 2. No acute abdominal/pelvic findings. 3. Large amount of stool throughout the colon and down into the rectum suggesting constipation. 4. Stable atherosclerotic calcifications involving the aorta and iliac arteries.    03/29/2017 - 10/29/2017 Anti-estrogen oral therapy    She was on Aromasin     04/19/2017 Imaging    1. Suspicion of peritoneal recurrence with enlarging implants in the gastrosplenic ligament, left adnexa and posterior to the cecum. No generalized ascites, adenopathy or definite solid organ disease. 2. No evidence of bowel or ureteral obstruction. 3.  Aortic Atherosclerosis (ICD10-I70.0).    06/25/2017 Genetic Testing    Patient has genetic testing done for Breast/ovarian cancer panel Results revealed patient has no detectable mutation    09/01/2017 Tumor Marker    Patient's tumor was tested for the following markers: CA-125 Results of the tumor marker test revealed 40.9    10/29/2017 - 03/24/2018 Anti-estrogen oral therapy    She was on tamoxifen    11/05/2017 Tumor Marker    Patient's tumor was tested for the following markers: CA-125 Results of the tumor marker test revealed 84.4    11/15/2017 Imaging    1. Mild progression of peritoneal metastasis. 2. Possible constipation. No obstruction or other acute complication. 3.  Aortic Atherosclerosis (ICD10-I70.0).    03/21/2018 Tumor Marker    Patient's tumor was tested for the following markers: CA-125 Results of the tumor marker test revealed 101.3.    04/03/2018 Tumor Marker    Patient's tumor was tested for the following markers: CA-125 Results of the tumor marker test revealed 120.9    04/10/2018 - 07/17/2018 Chemotherapy    The patient had single agent carboplatin x 5 cycles    05/02/2018 Tumor Marker    Patient's tumor was tested for the following markers: CA-125 Results of the tumor marker test revealed 68.2    05/29/2018 Tumor Marker    Patient's tumor was tested for the following markers: CA-125 Results of the tumor marker test revealed 41.7     Genetic Testing  Patient has genetic testing done for HRD. Results revealed: negative for deleterious BRCA1 or BRCA2 mutations in tumor.     06/26/2018 Tumor Marker    Patient's tumor was tested for the following markers: CA-125 Results of the tumor marker  test revealed 29.5    07/14/2018 Imaging    Interval decrease in size of bilateral adnexal masses.  No significant change in size of peritoneal metastasis in the left upper quadrant.  No new or progressive metastatic disease identified.    08/07/2018 - 11/07/2018 Chemotherapy    The patient had zejula    08/14/2018 Tumor Marker    Patient's tumor was tested for the following markers: CA-125 Results of the tumor marker test revealed 31.7    10/16/2018 Tumor Marker    Patient's tumor was tested for the following markers: CA-125 Results of the tumor marker test revealed 49    11/06/2018 Imaging    1. Mild to moderate enlargement of the left upper quadrant lesion along the gastric wall and pancreatic tail suspicious for malignancy. Subtle invasion of the capsule of the spleen as before. 2. Stable soft tissue nodularity along the right adnexa. Slightly reduced soft tissue prominence along the left adnexa. By report the patient has had bilateral salpingo oophorectomy and accordingly these adnexal lesions are concerning for tumor. 3. No new areas of tumor nodularity are identified. 4. Other imaging findings of potential clinical significance: Pelvic floor laxity with small cystocele. Prominent stool throughout the colon favors constipation. Upper normal sized left periaortic lymph node. Bibasilar cylindrical bronchiectasis. Aortic Atherosclerosis (ICD10-I70.0). Coronary atherosclerosis.    11/06/2018 Tumor Marker    Patient's tumor was tested for the following markers: CA-125 Results of the tumor marker test revealed 61.5     REVIEW OF SYSTEMS:   Constitutional: Denies fevers, chills or abnormal weight loss Eyes: Denies blurriness of vision Ears, nose, mouth, throat, and face: Denies mucositis or sore throat Respiratory: Denies cough, dyspnea or wheezes Cardiovascular: Denies palpitation, chest discomfort or lower extremity swelling Skin: Denies abnormal skin rashes Lymphatics: Denies new  lymphadenopathy or easy bruising Neurological:Denies numbness, tingling or new weaknesses Behavioral/Psych: Mood is stable, no new changes  All other systems were reviewed with the patient and are negative.  I have reviewed the past medical history, past surgical history, social history and family history with the patient and they are unchanged from previous note.  ALLERGIES:  is allergic to clonidine derivatives; coreg [carvedilol]; caffeine; codeine; flexeril [cyclobenzaprine]; lipitor [atorvastatin]; lisinopril; pravastatin; tape; tegaderm ag mesh [silver]; and ultram [tramadol].  MEDICATIONS:  Current Outpatient Medications  Medication Sig Dispense Refill  . acetaminophen (TYLENOL) 500 MG tablet Take 1,000 mg by mouth every 6 (six) hours as needed for mild pain.     Marland Kitchen amiodarone (PACERONE) 200 MG tablet Take 100 mg by mouth daily.  3  . amiodarone (PACERONE) 200 MG tablet TAKE 1/2 TABLET BY MOUTH DAILY 45 tablet 3  . amoxicillin (AMOXIL) 500 MG capsule Take 2,000 mg by mouth See admin instructions. Takes 4 capsules 1 hour prior to procedure    . Biotin 10 MG TABS Take 20 mg by mouth daily.    Marland Kitchen desonide (DESOWEN) 0.05 % cream Apply 1 application topically 2 (two) times daily.   1  . diphenhydrAMINE (BENADRYL) 25 MG tablet Take 50 mg by mouth at bedtime as needed for sleep.     Marland Kitchen docusate sodium (COLACE) 100 MG capsule Take 250 mg by mouth 2 (two) times daily.     Marland Kitchen  ELIQUIS 5 MG TABS tablet TAKE 1 TABLET BY MOUTH TWICE A DAY 180 tablet 1  . Evolocumab with Infusor 420 MG/3.5ML SOCT Inject into the skin.    Marland Kitchen ezetimibe (ZETIA) 10 MG tablet Take 1 tablet (10 mg total) by mouth daily. (Patient taking differently: Take 10 mg by mouth at bedtime. ) 30 tablet 6  . Glucosamine HCl 1000 MG TABS Take 1,000 mg by mouth daily.    . hydrocortisone 1 % ointment Apply 1 application topically 2 (two) times daily as needed for itching (eczema).     Marland Kitchen ipratropium (ATROVENT) 0.06 % nasal spray Place 2  sprays into both nostrils daily.   5  . levothyroxine (SYNTHROID) 88 MCG tablet Take 88 mcg by mouth daily before breakfast.    . lidocaine-prilocaine (EMLA) cream Apply to Porta-Cath site 1-2 hours prior to access as directed. 30 g 1  . magnesium hydroxide (MILK OF MAGNESIA) 800 MG/5ML suspension Take 30 mLs by mouth daily as needed for constipation. Reported on 11/20/2015    . Melatonin 5 MG CAPS Take 5 mg by mouth at bedtime as needed (sleep). Reported on 12/25/2015    . Menthol, Topical Analgesic, (ICY HOT EX) Apply 1 application topically 2 (two) times daily as needed (PAIN).     . Multiple Vitamin (MULTIVITAMIN WITH MINERALS) TABS tablet Take 1 tablet by mouth daily. Centrum Silver    . ondansetron (ZOFRAN) 8 MG tablet Take 1 tablet (8 mg total) by mouth every 8 (eight) hours as needed for nausea or vomiting. 90 tablet 1  . OVER THE COUNTER MEDICATION Co210 20 mg once a day for muscle aches.    Vladimir Faster Glycol-Propyl Glycol (SYSTANE OP) Place 1 drop into both eyes 2 (two) times daily.     . polyethylene glycol (MIRALAX / GLYCOLAX) packet Take 17 g by mouth daily. Mix in 8 oz liquid and drink    . potassium chloride (K-DUR) 10 MEQ tablet Take 10 mEq by mouth daily.    . prochlorperazine (COMPAZINE) 10 MG tablet Take 1 tablet (10 mg total) by mouth every 6 (six) hours as needed (Nausea or vomiting). 30 tablet 1  . SHINGRIX injection      No current facility-administered medications for this visit.    Facility-Administered Medications Ordered in Other Visits  Medication Dose Route Frequency Provider Last Rate Last Dose  . sodium chloride flush (NS) 0.9 % injection 10 mL  10 mL Intravenous PRN Joylene John D, NP   10 mL at 01/06/18 1220    PHYSICAL EXAMINATION: ECOG PERFORMANCE STATUS: 1 - Symptomatic but completely ambulatory  Vitals:   11/07/18 1211  BP: 138/82  Pulse: 92  Resp: 18  Temp: 98.6 F (37 C)  SpO2: 100%   Filed Weights   11/07/18 1211  Weight: 130 lb 6.4 oz  (59.1 kg)    GENERAL:alert, no distress and comfortable SKIN: skin color, texture, turgor are normal, no rashes or significant lesions Musculoskeletal:no cyanosis of digits and no clubbing  NEURO: alert & oriented x 3 with fluent speech, no focal motor/sensory deficits  LABORATORY DATA:  I have reviewed the data as listed    Component Value Date/Time   NA 141 11/06/2018 0956   NA 141 11/11/2017 1145   K 3.8 11/06/2018 0956   K 4.0 11/11/2017 1145   CL 103 11/06/2018 0956   CL 103 05/21/2013 1105   CO2 29 11/06/2018 0956   CO2 29 11/11/2017 1145   GLUCOSE 91 11/06/2018 0956  GLUCOSE 81 11/11/2017 1145   GLUCOSE 74 05/21/2013 1105   BUN 12 11/06/2018 0956   BUN 10.9 11/11/2017 1145   CREATININE 0.80 11/06/2018 0956   CREATININE 0.7 11/11/2017 1145   CALCIUM 9.5 11/06/2018 0956   CALCIUM 9.6 11/11/2017 1145   PROT 6.8 11/06/2018 0956   PROT 6.5 11/24/2016 1104   ALBUMIN 4.3 11/06/2018 0956   ALBUMIN 3.9 11/24/2016 1104   AST 23 11/06/2018 0956   AST 18 11/24/2016 1104   ALT 12 11/06/2018 0956   ALT 15 11/24/2016 1104   ALKPHOS 98 11/06/2018 0956   ALKPHOS 104 11/24/2016 1104   BILITOT 0.6 11/06/2018 0956   BILITOT 0.57 11/24/2016 1104   GFRNONAA >60 11/06/2018 0956   GFRAA >60 11/06/2018 0956    No results found for: SPEP, UPEP  Lab Results  Component Value Date   WBC 3.3 (L) 11/06/2018   NEUTROABS 1.7 11/06/2018   HGB 10.4 (L) 11/06/2018   HCT 31.5 (L) 11/06/2018   MCV 114.1 (H) 11/06/2018   PLT 127 (L) 11/06/2018      Chemistry      Component Value Date/Time   NA 141 11/06/2018 0956   NA 141 11/11/2017 1145   K 3.8 11/06/2018 0956   K 4.0 11/11/2017 1145   CL 103 11/06/2018 0956   CL 103 05/21/2013 1105   CO2 29 11/06/2018 0956   CO2 29 11/11/2017 1145   BUN 12 11/06/2018 0956   BUN 10.9 11/11/2017 1145   CREATININE 0.80 11/06/2018 0956   CREATININE 0.7 11/11/2017 1145      Component Value Date/Time   CALCIUM 9.5 11/06/2018 0956   CALCIUM 9.6  11/11/2017 1145   ALKPHOS 98 11/06/2018 0956   ALKPHOS 104 11/24/2016 1104   AST 23 11/06/2018 0956   AST 18 11/24/2016 1104   ALT 12 11/06/2018 0956   ALT 15 11/24/2016 1104   BILITOT 0.6 11/06/2018 0956   BILITOT 0.57 11/24/2016 1104       RADIOGRAPHIC STUDIES: I have reviewed multiple imaging studies with the patient and her husband I have personally reviewed the radiological images as listed and agreed with the findings in the report. Ct Abdomen Pelvis W Contrast  Result Date: 11/06/2018 CLINICAL DATA:  Recurrent ovarian cancer, restaging assessment. EXAM: CT ABDOMEN AND PELVIS WITH CONTRAST TECHNIQUE: Multidetector CT imaging of the abdomen and pelvis was performed using the standard protocol following bolus administration of intravenous contrast. CONTRAST:  139m OMNIPAQUE IOHEXOL 300 MG/ML  SOLN COMPARISON:  07/14/2018 FINDINGS: Lower chest: Stable scarring in the left lower lobe. Mild bibasilar cylindrical bronchiectasis. Descending thoracic aortic atherosclerotic calcification and right coronary artery atherosclerotic calcification. Hepatobiliary: Unremarkable Pancreas: There is a mass interposed between the pancreatic tail, spleen, stomach with primarily cystic elements measuring approximately 5.0 by 3.2 by 3.1 cm cm today, previously by my 4.9 by 3.2 by 1.9 cm. This lesion is inseparable from the upper margin of pancreatic tail and likewise closely associated with the stomach. Involvement of the gastric wall is a distinct possibility posteriorly. The pancreas appears otherwise unremarkable. Spleen: There is some faint hypodensity in the spleen adjacent to the mass for example on image 20/2, stable, and conceivably representing subtle splenic invasion. Adrenals/Urinary Tract: Pelvic floor laxity with small cystocele. Adrenal glands normal. Small right mid kidney triangular hypodense lesion is likely a cyst but technically too small to characterize. No urinary tract calculi are identified.  Stomach/Bowel: As noted above, tumor infiltration of the wall of the gastric cardia posteriorly is  not excluded. Prominent stool throughout the colon favors constipation. Vascular/Lymphatic: Aortoiliac atherosclerotic vascular disease. Left periaortic node 0.9 cm in short axis on image 25/2, formerly 0.7 cm. Reproductive: The uterus is absent. The adnexa are indistinct, with some soft tissue nodularity along the right adnexa potentially from right ovary or tumor measuring 2.5 by 3.1 cm on image 54/2 (formerly 2.5 by 1.6 cm) and with left adnexal soft tissue density measuring 2.1 by 2.1 cm on image 54/2 (formerly by my measurement 2.6 by 2.5 cm). Other: Paucity of anterior adipose tissue favoring omentectomy. Musculoskeletal: Right hip prosthesis. Exaggerated lumbar lordosis. Multilevel degenerative grade 1 retrolisthesis at T12-L1, L1-2, and L2-3. IMPRESSION: 1. Mild to moderate enlargement of the left upper quadrant lesion along the gastric wall and pancreatic tail suspicious for malignancy. Subtle invasion of the capsule of the spleen as before. 2. Stable soft tissue nodularity along the right adnexa. Slightly reduced soft tissue prominence along the left adnexa. By report the patient has had bilateral salpingo oophorectomy and accordingly these adnexal lesions are concerning for tumor. 3. No new areas of tumor nodularity are identified. 4. Other imaging findings of potential clinical significance: Pelvic floor laxity with small cystocele. Prominent stool throughout the colon favors constipation. Upper normal sized left periaortic lymph node. Bibasilar cylindrical bronchiectasis. Aortic Atherosclerosis (ICD10-I70.0). Coronary atherosclerosis. Electronically Signed   By: Van Clines M.D.   On: 11/06/2018 16:15    All questions were answered. The patient knows to call the clinic with any problems, questions or concerns. No barriers to learning was detected.  I spent 30 minutes counseling the patient face  to face. The total time spent in the appointment was 40 minutes and more than 50% was on counseling and review of test results  Heath Lark, MD 11/07/2018 2:18 PM

## 2018-11-07 NOTE — Assessment & Plan Note (Signed)
Imaging studies showed significant fecal loading We discussed importance of aggressive laxative therapy

## 2018-11-07 NOTE — Telephone Encounter (Signed)
She called and left a message to call her back.  Called back. She is calling about the treatment options.  1. After her treatment completed and her CA-125 is down. What would be her treatment option be? 2. Beryle Flock is that a option? She read about it. 3. Would taking Taxol alone be a option?

## 2018-11-07 NOTE — Assessment & Plan Note (Signed)
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

## 2018-11-07 NOTE — Assessment & Plan Note (Signed)
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 We discussed importance of Advanced Directives and Living will. She has advanced directives and living will at home.  Her husband, Levada Dy is the medical healthcare power of attorney

## 2018-11-08 NOTE — Telephone Encounter (Signed)
1) we will discuss long term treatment after we can get her disease under control in another visit. She needs at least 2-3 months of treatment before repeat CT 2) [name] is not an option in her disease 3) Single agent taxol was one of the option discussed

## 2018-11-08 NOTE — Telephone Encounter (Signed)
Called and given below message. She verbalized understanding.  She would like to start treatment after Christmas on a Monday or Tuesday. Either on 12/30 or 12/31 if available. She has decided on getting Taxol and Carboplatin. She is not interested in the [place].  If Dr. Alvy Bimler is agreeable and thinks it a good option.

## 2018-11-08 NOTE — Telephone Encounter (Signed)
Called and left a message asking her to call office.

## 2018-11-09 ENCOUNTER — Telehealth: Payer: Self-pay | Admitting: Hematology and Oncology

## 2018-11-09 ENCOUNTER — Telehealth: Payer: Self-pay

## 2018-11-09 ENCOUNTER — Other Ambulatory Visit: Payer: Self-pay | Admitting: Hematology and Oncology

## 2018-11-09 DIAGNOSIS — Z7189 Other specified counseling: Secondary | ICD-10-CM

## 2018-11-09 DIAGNOSIS — C569 Malignant neoplasm of unspecified ovary: Secondary | ICD-10-CM

## 2018-11-09 NOTE — Telephone Encounter (Signed)
Scheduled appt per 12/12 sch message - pt is aware of appt date and time  Per NG okay for treatment on 12/28 due to cap

## 2018-11-09 NOTE — Telephone Encounter (Signed)
The gem/carbo or carbo/taxol are rated equal. Gem Norma Fredrickson is not superior than the other

## 2018-11-09 NOTE — Telephone Encounter (Signed)
Called back and given below message. She verbalized understanding. She would like to come in on 12/26.

## 2018-11-09 NOTE — Telephone Encounter (Signed)
I suggest we meet again before the holidays for final discussion before treatment I can see her with labs on either 12/26 or 12/27 and the chemo to start 12/30 or 12/31. If that is ok with her, let me know and I will put order and scheduling msg

## 2018-11-09 NOTE — Telephone Encounter (Signed)
Scheduling msg sent 

## 2018-11-09 NOTE — Telephone Encounter (Signed)
She called and left a message to call her.  Called back. She has been reading the guideline sheet given at office visit. Per the sheet, Gemcitabine and Carboplatin is the recommended treatment. Then the next recommended treatment is Taxol and Carboplatin. Her questions. 1. What is the difference?  2. What do you think?

## 2018-11-09 NOTE — Telephone Encounter (Signed)
Called back and given below message. She verbalized understanding. 

## 2018-11-09 NOTE — Progress Notes (Signed)
DISCONTINUE ON PATHWAY REGIMEN - Ovarian     A cycle is every 21 days:     Carboplatin   **Always confirm dose/schedule in your pharmacy ordering system**  REASON: Other Reason PRIOR TREATMENT: [id number]: Carboplatin AUC=5 q21 Days; Re-evaluate Every 3 Cycles, Treat until Complete Response, Unacceptable Toxicity, or Disease Progression TREATMENT RESPONSE: Stable Disease (SD)  START OFF PATHWAY REGIMEN - Ovarian   [id number]:Carboplatin + Paclitaxel (5/175) q21 Days:   A cycle is every 21 days:     Paclitaxel      Carboplatin   **Always confirm dose/schedule in your pharmacy ordering system**  Patient Characteristics: Recurrent or Progressive Disease, Fourth Line and Beyond, BRCA Mutation Absent/Unknown Therapeutic Status: Recurrent or Progressive Disease BRCA Mutation Status: Absent Line of Therapy: Fourth Line and Beyond  Intent of Therapy: Non-Curative / Palliative Intent, Discussed with Patient

## 2018-11-13 ENCOUNTER — Other Ambulatory Visit: Payer: Self-pay | Admitting: Hematology and Oncology

## 2018-11-23 ENCOUNTER — Inpatient Hospital Stay: Payer: Medicare Other

## 2018-11-23 ENCOUNTER — Encounter: Payer: Self-pay | Admitting: Hematology and Oncology

## 2018-11-23 ENCOUNTER — Telehealth: Payer: Self-pay | Admitting: Hematology and Oncology

## 2018-11-23 ENCOUNTER — Inpatient Hospital Stay: Payer: Medicare Other | Admitting: Hematology and Oncology

## 2018-11-23 VITALS — BP 154/67 | HR 74 | Temp 97.8°F | Resp 17 | Wt 131.4 lb

## 2018-11-23 DIAGNOSIS — C569 Malignant neoplasm of unspecified ovary: Secondary | ICD-10-CM

## 2018-11-23 DIAGNOSIS — Z7189 Other specified counseling: Secondary | ICD-10-CM

## 2018-11-23 DIAGNOSIS — R531 Weakness: Secondary | ICD-10-CM

## 2018-11-23 DIAGNOSIS — G620 Drug-induced polyneuropathy: Secondary | ICD-10-CM

## 2018-11-23 DIAGNOSIS — K5909 Other constipation: Secondary | ICD-10-CM

## 2018-11-23 DIAGNOSIS — T451X5S Adverse effect of antineoplastic and immunosuppressive drugs, sequela: Secondary | ICD-10-CM

## 2018-11-23 DIAGNOSIS — R2689 Other abnormalities of gait and mobility: Secondary | ICD-10-CM

## 2018-11-23 DIAGNOSIS — Z5111 Encounter for antineoplastic chemotherapy: Secondary | ICD-10-CM | POA: Diagnosis not present

## 2018-11-23 DIAGNOSIS — D61818 Other pancytopenia: Secondary | ICD-10-CM

## 2018-11-23 DIAGNOSIS — T451X5A Adverse effect of antineoplastic and immunosuppressive drugs, initial encounter: Secondary | ICD-10-CM

## 2018-11-23 LAB — CBC WITH DIFFERENTIAL (CANCER CENTER ONLY)
Abs Immature Granulocytes: 0.01 10*3/uL (ref 0.00–0.07)
BASOS ABS: 0 10*3/uL (ref 0.0–0.1)
Basophils Relative: 1 %
Eosinophils Absolute: 0 10*3/uL (ref 0.0–0.5)
Eosinophils Relative: 1 %
HCT: 32.3 % — ABNORMAL LOW (ref 36.0–46.0)
Hemoglobin: 10.5 g/dL — ABNORMAL LOW (ref 12.0–15.0)
Immature Granulocytes: 0 %
Lymphocytes Relative: 33 %
Lymphs Abs: 1 10*3/uL (ref 0.7–4.0)
MCH: 36.8 pg — ABNORMAL HIGH (ref 26.0–34.0)
MCHC: 32.5 g/dL (ref 30.0–36.0)
MCV: 113.3 fL — ABNORMAL HIGH (ref 80.0–100.0)
Monocytes Absolute: 0.5 10*3/uL (ref 0.1–1.0)
Monocytes Relative: 15 %
NEUTROS PCT: 50 %
NRBC: 0 % (ref 0.0–0.2)
Neutro Abs: 1.6 10*3/uL — ABNORMAL LOW (ref 1.7–7.7)
PLATELETS: 138 10*3/uL — AB (ref 150–400)
RBC: 2.85 MIL/uL — ABNORMAL LOW (ref 3.87–5.11)
RDW: 12.2 % (ref 11.5–15.5)
WBC Count: 3.2 10*3/uL — ABNORMAL LOW (ref 4.0–10.5)

## 2018-11-23 LAB — CMP (CANCER CENTER ONLY)
ALT: 13 U/L (ref 0–44)
AST: 19 U/L (ref 15–41)
Albumin: 4.1 g/dL (ref 3.5–5.0)
Alkaline Phosphatase: 80 U/L (ref 38–126)
Anion gap: 8 (ref 5–15)
BUN: 14 mg/dL (ref 8–23)
CHLORIDE: 103 mmol/L (ref 98–111)
CO2: 30 mmol/L (ref 22–32)
Calcium: 9.6 mg/dL (ref 8.9–10.3)
Creatinine: 0.82 mg/dL (ref 0.44–1.00)
GFR, Est AFR Am: >60 mL/min (ref >60)
GFR, Estimated: >60 mL/min (ref >60)
Glucose, Bld: 84 mg/dL (ref 70–99)
Potassium: 3.8 mmol/L (ref 3.5–5.1)
Sodium: 141 mmol/L (ref 135–145)
Total Bilirubin: 0.4 mg/dL (ref 0.3–1.2)
Total Protein: 6.6 g/dL (ref 6.5–8.1)

## 2018-11-23 MED ORDER — DEXAMETHASONE 4 MG PO TABS: ORAL_TABLET | ORAL | 0 refills | Status: DC

## 2018-11-23 NOTE — Assessment & Plan Note (Signed)
She has chronic pancytopenia.  I plan reduced dose treatment as above

## 2018-11-23 NOTE — Assessment & Plan Note (Addendum)
She wants to be aggressive with treatment.   She felt that combination treatment with carboplatin and Taxol would give her the best chance.  Other combination chemotherapy were discussed She does have some baseline peripheral neuropathy and mild pancytopenia We reviewed the NCCN guidelines We discussed the role of chemotherapy. The intent is of palliative intent.  We discussed some of the risks, benefits, side-effects of carboplatin & Taxol. Treatment is intravenous, every 3 weeks x 6 cycles  Some of the short term side-effects included, though not limited to, including weight loss, life threatening infections, risk of allergic reactions, need for transfusions of blood products, nausea, vomiting, change in bowel habits, loss of hair, admission to hospital for various reasons, and risks of death.   Long term side-effects are also discussed including risks of infertility, permanent damage to nerve function, hearing loss, chronic fatigue, kidney damage with possibility needing hemodialysis, and rare secondary malignancy including bone marrow disorders.  The patient is aware that the response rates discussed earlier is not guaranteed.  After a long discussion, patient made an informed decision to proceed with the prescribed plan of care.   Patient education material was dispensed. We discussed premedication with dexamethasone before chemotherapy. I will not prescribe prophylactic G-CSF support I plan to reduce the dose of Taxol due to peripheral neuropathy. 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

## 2018-11-23 NOTE — Progress Notes (Signed)
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   She felt that combination treatment with carboplatin and Taxol would give her the best chance.  Other combination chemotherapy were discussed She does have some baseline peripheral neuropathy and mild pancytopenia We reviewed the NCCN guidelines We discussed the role of chemotherapy. The intent is of palliative intent.  We discussed some of the risks, benefits, side-effects of carboplatin & Taxol. Treatment is intravenous, every 3 weeks x 6 cycles  Some of the short term side-effects included, though not limited to, including weight loss, life threatening infections, risk of allergic reactions, need for transfusions of blood products, nausea, vomiting, change in bowel habits, loss of hair, admission to hospital for various reasons, and risks of death.   Long term side-effects are also discussed including risks of infertility, permanent damage to nerve function, hearing loss, chronic fatigue, kidney damage with possibility needing hemodialysis, and rare secondary malignancy including bone marrow disorders.  The patient is aware that the response rates discussed earlier is not guaranteed.  After a long discussion, patient made an informed decision to proceed with the prescribed plan of care.   Patient education material was dispensed. We discussed premedication with dexamethasone before chemotherapy. I will not prescribe prophylactic G-CSF support I plan to reduce the dose of Taxol due to peripheral neuropathy. Previously, she can only tolerate reduced dose carboplatin and I plan to reduce that as well at AUC of 4 Due to concerns for alopecia, I have given her prescription for cranial prosthesis   Pancytopenia, acquired [facility]) She has chronic pancytopenia.  I plan reduced  dose treatment as above  Chemotherapy-induced peripheral neuropathy ([place]) I plan reduced dose chemotherapy as above She has some weakness and gait instability. I recommend referral to cancer rehab facility for physical therapy  Other constipation Imaging studies showed significant fecal loading We discussed importance of aggressive laxative therapy  Goals of care, counseling/discussion The patient is aware she has incurable disease and treatment is strictly palliative. We discussed importance of Advanced Directives and Living will. She has advanced directives and living will at home.  Her husband, Levada Dy is the medical healthcare power of attorney    Orders Placed This Encounter  Procedures  . Ambulatory referral to Physical Therapy    Referral Priority:   Routine    Referral Type:   Physical Medicine    Referral Reason:   Specialty Services Required    Requested Specialty:   Physical Therapy    Number of Visits Requested:   1    INTERVAL HISTORY: Please see below for problem oriented charting. She returns for chemotherapy consent She has some perception of mild worsening neuropathy recently She also felt weak and has some concerns about her gait stability. She denies recent fall She continues to have chronic constipation, resolved with laxative therapy.  No recent nausea. 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 Progressed on Aromasin, switched to Tamoxifen Progressed on Zejula     Malignant neoplasm of ovary ([place]) (Resolved)   03/11/2013 Initial Diagnosis    Malignant neoplasm of ovary     Recurrent carcinoma of ovary ([place])  01/05/2013 Tumor Marker    Patient's tumor was tested for the following markers: CA-125 Results of the tumor  marker test revealed 178.8    01/17/2013 Imaging    CT scan of abdomen  10.0 cm complex right ovarian neoplasm, with findings worrisome for malignancy.  Surgical consultation is advised.  No findings to suggest metastatic disease in the abdomen/pelvis    02/13/2013 Pathology Results    1. Adnexa - ovary +/- tube, neoplastic - HIGH GRADE OVARIAN POORLY DIFFERENTIATED CARCINOMA WITH ASSOCIATED TUMOR NECROSIS, 11.5 CM, INVOLVING THE OVARIAN CAPSULE AND ADJACENT FALLOPIAN TUBE. - PLEASE SEE ONCOLOGY TEMPLATE FOR DETAILS. 2. Ovary and fallopian tube, left - HIGH GRADE OVARIAN POORLY DIFFERENTIATED CARCINOMA WITH ASSOCIATED TUMOR NECROSIS, 2.3 CM, INVOLVING THE OVARIAN CAPSULE. - FALLOPIAN TUBAL TISSUE, NO EVIDENCE OF MALIGNANCY. - PLEASE SEE ONCOLOGY TEMPLATE FOR DETAILS. 3. Uterus and cervix - ENDOMETRIUM: BENIGN WEAKLY PROLIFERATIVE ENDOMETRIUM, NO ATYPIA, HYPERPLASIA OR MALIGNANCY. - CERVIX: BENIGN SQUAMOUS MUCOSA AND ENDOCERVICAL MUCOSA, NO DYSPLASIA OR MALIGNANCY. - UTERINE SEROSA: ADHESIONS, NO EVIDENCE OF ENDOMETRIOSIS, ATYPIA OR MALIGNANCY. 4. Cul-de-sac biopsy - POSITIVE FOR METASTATIC CARCINOMA (2.0 CM). 5. Soft tissue mass, simple excision, peri rectal - POSITIVE FOR METASTATIC CARCINOMA (2.6 CM). 6. Lymph nodes, regional resection, right pelvic - SEVEN LYMPH NODES, NEGATIVE FOR METASTATIC CARCINOMA (0/7). 7. Omentum, resection for tumor - MATURE ADIPOSE TISSUE, NO EVIDENCE OF MALIGNANCY. Microscopic Comment 1. OVARY Specimen(s): Right and left ovaries and fallopian tubes, uterus and cervix Procedure(s): Total hysterectomy, bilateral salpingo-oophorectomy Lymph node sampling performed: Yes Primary tumor site: Right and left ovaries Ovarian surface involvement: Present Maximum tumor size (cm): Right ovary: 11.5 cm; left ovary: 2.3 cm, gross measurement. 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 please see  comment. Grade: High grade Peritoneal implants: No Pelvic extension: Cul-de-sac biopsy and perirectal soft tissue biopsy: positive Peritoneal washings: Rare cluster of atypical cells present ([id number]) Lymph nodes: number examined 7 ; number positive 0 TNM code: pT2c, pN0 FIGO Stage (based on pathologic findings, needs clinical correlation): II B (2014 edition) Comments: Sections of bilateral ovaries show sheets and nests of malignant epithelium resembling transitional cell carcinoma with associated tumor necrosis. No evidence of Brenner's tumor or conventional endometrioid carcinoma is identified in the background. The bilateral ovarian capsules are involved. Immunohistochemical stains were performed and the malignant cells are strongly positive for cytokeratin 7, WT-1, p16 and p53, negative for cytokeratin 20. 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 The tumor involves the right fallopian tube. Both cul-de-sac and perirectal soft tissue biopsies are positive for metastatic carcinoma. The omentum biopsy is negative for tumor.     03/20/2013 - 08/14/2013 Chemotherapy    The patient had 6 cycles of carboplatin and taxol    09/25/2013 Imaging    1. Interval resection of previously described right pelvic mass. No evidence of recurrent or metastatic disease. 2. Small hiatal hernia. 3. Moderate amount of stool within the rectum. Question constipation or even mild fecal impaction.    04/03/2015 PET scan    1. Highly hypermetabolic height bowed dense mass in the medial spleen compatible with malignancy. 2. Bilateral pelvic sidewall adenopathy, right greater than left, hypermetabolic, with small peritoneal implants of tumor in the lower pelvis, and with several faint but suspected omental implants of tumor. There is also a hypermetabolic aortocaval lymph node in the retroperitoneum. 3. Generalized  increased thyroid activity could be due to  thyroiditis. Although a well-defined nodule is not seen in the right thyroid lobe, there is focal increased hypermetabolic activity in the right thyroid lobe. Thyroid ultrasound is recommended for further investigation. 4. I suspect that the high activity between the spinous processes of L3 and L4 is degenerative.    05/01/2015 - 10/21/2015 Chemotherapy    The patient had 8 cycles of carboplatin, taxol and Avastin.      07/02/2015 Imaging    1. Decrease in size of these splenic lesion. Suspect tumor also involving the adjacent posterior wall of the stomach. 2. Improved pelvic lymphadenopathy. 3. No new omental or peritoneal surface disease.     09/10/2015 Imaging    1. Response to therapy, with further decreased size of a medial splenic lesion. The previously questioned extension into the stomach is no longer identified and may have been artifactual on the prior exam. Of note, an inferior splenic low-density capsular based lesion is felt to be similar and warrants followup attention. 2. No adenopathy or new peritoneal disease identified. 3.  Possible constipation. 4.  Advanced aortic and branch vessel atherosclerosis.    11/06/2015 Imaging    1. Mass arising from the medial spleen and involving the posterior wall of stomach demonstrates mild decrease in size in the interval. 2. Interval improvement and previously referenced pathologic iliac lymph nodes. 3. Previously referenced peritoneal implants within the pelvis are no longer measurable. No new areas of peritoneal metastasis is identified. 4. There is a right hilar node which is borderline enlarged but is increased in size when compared with 04/19/2013. Attention on follow-up examination is recommended.    11/18/2015 PET scan    1. Residual hypermetabolic tumor implant between the stomach and spleen, significantly decreased in size and mildly decreased in metabolism compared to 04/03/2015 PET-CT,  and slightly decreased in size since 11/06/2015. 2. Residual hypermetabolic right deep pelvic side wall lymph node, with partial treatment response compared to 04/03/2015 PET-CT. 3. No new sites of hypermetabolic metastatic disease. 4. Stable diffuse thyroid hypermetabolism without discrete thyroid nodule, most suggestive of diffuse thyroiditis. Recommend correlation with serum thyroid function tests.     01/28/2016 - 11/26/2016 Anti-estrogen oral therapy    She was placed on Letrozole, stopped due to poor tolerance    02/09/2016 Imaging    1. Continued decrease in size of peritoneal lesion between the posterior wall of stomach and spleen. 2. No new or progressive disease identified    06/23/2016 Imaging    1. Near complete resolution of subtle residual soft tissue density lesion in the gastrosplenic ligament. 2. No new sites of disease identified. 3. Stool burden suggests constipation or even fecal impaction    12/01/2016 Imaging    1. No CT findings for residual or recurrent abdominal/pelvic ovarian cancer. 2. No acute abdominal/pelvic findings. 3. Large amount of stool throughout the colon and down into the rectum suggesting constipation. 4. Stable atherosclerotic calcifications involving the aorta and iliac arteries.    03/29/2017 - 10/29/2017 Anti-estrogen oral therapy    She was on Aromasin    04/19/2017 Imaging    1. Suspicion of peritoneal recurrence with enlarging implants in the gastrosplenic ligament, left adnexa and posterior to the cecum. No generalized ascites, adenopathy or definite solid organ disease. 2. No evidence of bowel or ureteral obstruction. 3.  Aortic Atherosclerosis (ICD10-I70.0).    06/25/2017 Genetic Testing    Patient has genetic testing done for Breast/ovarian cancer panel Results revealed patient has no detectable mutation    09/01/2017 Tumor Marker  Patient's tumor was tested for the following markers: CA-125 Results of the tumor marker test revealed  40.9    10/29/2017 - 03/24/2018 Anti-estrogen oral therapy    She was on tamoxifen    11/05/2017 Tumor Marker    Patient's tumor was tested for the following markers: CA-125 Results of the tumor marker test revealed 84.4    11/15/2017 Imaging    1. Mild progression of peritoneal metastasis. 2. Possible constipation. No obstruction or other acute complication. 3.  Aortic Atherosclerosis (ICD10-I70.0).    03/21/2018 Tumor Marker    Patient's tumor was tested for the following markers: CA-125 Results of the tumor marker test revealed 101.3.    04/03/2018 Tumor Marker    Patient's tumor was tested for the following markers: CA-125 Results of the tumor marker test revealed 120.9    04/10/2018 - 07/17/2018 Chemotherapy    The patient had single agent carboplatin x 5 cycles    05/02/2018 Tumor Marker    Patient's tumor was tested for the following markers: CA-125 Results of the tumor marker test revealed 68.2    05/29/2018 Tumor Marker    Patient's tumor was tested for the following markers: CA-125 Results of the tumor marker test revealed 41.7     Genetic Testing    Patient has genetic testing done for HRD. Results revealed: negative for deleterious BRCA1 or BRCA2 mutations in tumor.     06/26/2018 Tumor Marker    Patient's tumor was tested for the following markers: CA-125 Results of the tumor marker test revealed 29.5    07/14/2018 Imaging    Interval decrease in size of bilateral adnexal masses.  No significant change in size of peritoneal metastasis in the left upper quadrant.  No new or progressive metastatic disease identified.    08/07/2018 - 11/07/2018 Chemotherapy    The patient had zejula    08/14/2018 Tumor Marker    Patient's tumor was tested for the following markers: CA-125 Results of the tumor marker test revealed 31.7    10/16/2018 Tumor Marker    Patient's tumor was tested for the following markers: CA-125 Results of the tumor marker test revealed 49    11/06/2018  Imaging    1. Mild to moderate enlargement of the left upper quadrant lesion along the gastric wall and pancreatic tail suspicious for malignancy. Subtle invasion of the capsule of the spleen as before. 2. Stable soft tissue nodularity along the right adnexa. Slightly reduced soft tissue prominence along the left adnexa. By report the patient has had bilateral salpingo oophorectomy and accordingly these adnexal lesions are concerning for tumor. 3. No new areas of tumor nodularity are identified. 4. Other imaging findings of potential clinical significance: Pelvic floor laxity with small cystocele. Prominent stool throughout the colon favors constipation. Upper normal sized left periaortic lymph node. Bibasilar cylindrical bronchiectasis. Aortic Atherosclerosis (ICD10-I70.0). Coronary atherosclerosis.    11/06/2018 Tumor Marker    Patient's tumor was tested for the following markers: CA-125 Results of the tumor marker test revealed 61.5    11/25/2018 -  Chemotherapy    The patient had palonosetron (ALOXI) injection 0.25 mg, 0.25 mg, Intravenous,  Once, 0 of 4 cycles CARBOplatin (PARAPLATIN) 280 mg in sodium chloride 0.9 % 100 mL chemo infusion, 280 mg (80 % of original dose 352 mg), Intravenous,  Once, 0 of 4 cycles Dose modification:   (original dose 352 mg, Cycle 1) PACLitaxel (TAXOL) 198 mg in sodium chloride 0.9 % 250 mL chemo infusion (> 82m/m2), 122.5 mg/m2 =  198 mg (70 % of original dose 175 mg/m2), Intravenous,  Once, 0 of 4 cycles Dose modification: 122.5 mg/m2 (70 % of original dose 175 mg/m2, Cycle 1, Reason: Dose Not Tolerated)  for chemotherapy treatment.      REVIEW OF SYSTEMS:   Constitutional: Denies fevers, chills or abnormal weight loss Eyes: Denies blurriness of vision Ears, nose, mouth, throat, and face: Denies mucositis or sore throat Respiratory: Denies cough, dyspnea or wheezes Cardiovascular: Denies palpitation, chest discomfort or lower extremity swelling Skin:  Denies abnormal skin rashes Lymphatics: Denies new lymphadenopathy or easy bruising Behavioral/Psych: Mood is stable, no new changes  All other systems were reviewed with the patient and are negative.  I have reviewed the past medical history, past surgical history, social history and family history with the patient and they are unchanged from previous note.  ALLERGIES:  is allergic to clonidine derivatives; coreg [carvedilol]; caffeine; codeine; flexeril [cyclobenzaprine]; lipitor [atorvastatin]; lisinopril; pravastatin; tape; tegaderm ag mesh [silver]; and ultram [tramadol].  MEDICATIONS:  Current Outpatient Medications  Medication Sig Dispense Refill  . acetaminophen (TYLENOL) 500 MG tablet Take 1,000 mg by mouth every 6 (six) hours as needed for mild pain.     Marland Kitchen amiodarone (PACERONE) 200 MG tablet Take 100 mg by mouth daily.  3  . amiodarone (PACERONE) 200 MG tablet TAKE 1/2 TABLET BY MOUTH DAILY 45 tablet 3  . amoxicillin (AMOXIL) 500 MG capsule Take 2,000 mg by mouth See admin instructions. Takes 4 capsules 1 hour prior to procedure    . Biotin 10 MG TABS Take 20 mg by mouth daily.    Marland Kitchen desonide (DESOWEN) 0.05 % cream Apply 1 application topically 2 (two) times daily.   1  . dexamethasone (DECADRON) 4 MG tablet Take 3 tabs at the night before and 3 tabs the morning of chemotherapy, every 3 weeks, by mouth 36 tablet 0  . diphenhydrAMINE (BENADRYL) 25 MG tablet Take 50 mg by mouth at bedtime as needed for sleep.     Marland Kitchen docusate sodium (COLACE) 100 MG capsule Take 250 mg by mouth 2 (two) times daily.     Marland Kitchen ELIQUIS 5 MG TABS tablet TAKE 1 TABLET BY MOUTH TWICE A DAY 180 tablet 1  . Evolocumab with Infusor 420 MG/3.5ML SOCT Inject into the skin.    Marland Kitchen ezetimibe (ZETIA) 10 MG tablet Take 1 tablet (10 mg total) by mouth daily. (Patient taking differently: Take 10 mg by mouth at bedtime. ) 30 tablet 6  . Glucosamine HCl 1000 MG TABS Take 1,000 mg by mouth daily.    . hydrocortisone 1 % ointment  Apply 1 application topically 2 (two) times daily as needed for itching (eczema).     Marland Kitchen ipratropium (ATROVENT) 0.06 % nasal spray Place 2 sprays into both nostrils daily.   5  . levothyroxine (SYNTHROID) 88 MCG tablet Take 88 mcg by mouth daily before breakfast.    . lidocaine-prilocaine (EMLA) cream Apply to Porta-Cath site 1-2 hours prior to access as directed. 30 g 1  . magnesium hydroxide (MILK OF MAGNESIA) 800 MG/5ML suspension Take 30 mLs by mouth daily as needed for constipation. Reported on 11/20/2015    . Melatonin 5 MG CAPS Take 5 mg by mouth at bedtime as needed (sleep). Reported on 12/25/2015    . Menthol, Topical Analgesic, (ICY HOT EX) Apply 1 application topically 2 (two) times daily as needed (PAIN).     . Multiple Vitamin (MULTIVITAMIN WITH MINERALS) TABS tablet Take 1 tablet by mouth daily.  Centrum Silver    . ondansetron (ZOFRAN) 8 MG tablet Take 1 tablet (8 mg total) by mouth every 8 (eight) hours as needed for nausea or vomiting. 90 tablet 1  . OVER THE COUNTER MEDICATION Co210 20 mg once a day for muscle aches.    Vladimir Faster Glycol-Propyl Glycol (SYSTANE OP) Place 1 drop into both eyes 2 (two) times daily.     . polyethylene glycol (MIRALAX / GLYCOLAX) packet Take 17 g by mouth daily. Mix in 8 oz liquid and drink    . potassium chloride (K-DUR) 10 MEQ tablet Take 10 mEq by mouth daily.    . prochlorperazine (COMPAZINE) 10 MG tablet Take 1 tablet (10 mg total) by mouth every 6 (six) hours as needed (Nausea or vomiting). 30 tablet 1  . SHINGRIX injection      No current facility-administered medications for this visit.    Facility-Administered Medications Ordered in Other Visits  Medication Dose Route Frequency Provider Last Rate Last Dose  . sodium chloride flush (NS) 0.9 % injection 10 mL  10 mL Intravenous PRN Joylene John D, NP   10 mL at 01/06/18 1220    PHYSICAL EXAMINATION: ECOG PERFORMANCE STATUS: 1 - Symptomatic but completely ambulatory  Vitals:   11/23/18  0936  BP: (!) 154/67  Pulse: 74  Resp: 17  Temp: 97.8 F (36.6 C)  SpO2: 100%   Filed Weights   11/23/18 0936  Weight: 131 lb 6.4 oz (59.6 kg)    GENERAL:alert, no distress and comfortable SKIN: skin color, texture, turgor are normal, no rashes or significant lesions EYES: normal, Conjunctiva are pink and non-injected, sclera clear OROPHARYNX:no exudate, no erythema and lips, buccal mucosa, and tongue normal  NECK: supple, thyroid normal size, non-tender, without nodularity LYMPH:  no palpable lymphadenopathy in the cervical, axillary or inguinal LUNGS: clear to auscultation and percussion with normal breathing effort HEART: regular rate & rhythm and no murmurs and no lower extremity edema ABDOMEN:abdomen soft, non-tender and normal bowel sounds Musculoskeletal:no cyanosis of digits and no clubbing  NEURO: alert & oriented x 3 with fluent speech, no focal motor/sensory deficits  LABORATORY DATA:  I have reviewed the data as listed    Component Value Date/Time   NA 141 11/23/2018 0916   NA 141 11/11/2017 1145   K 3.8 11/23/2018 0916   K 4.0 11/11/2017 1145   CL 103 11/23/2018 0916   CL 103 05/21/2013 1105   CO2 30 11/23/2018 0916   CO2 29 11/11/2017 1145   GLUCOSE 84 11/23/2018 0916   GLUCOSE 81 11/11/2017 1145   GLUCOSE 74 05/21/2013 1105   BUN 14 11/23/2018 0916   BUN 10.9 11/11/2017 1145   CREATININE 0.82 11/23/2018 0916   CREATININE 0.7 11/11/2017 1145   CALCIUM 9.6 11/23/2018 0916   CALCIUM 9.6 11/11/2017 1145   PROT 6.6 11/23/2018 0916   PROT 6.5 11/24/2016 1104   ALBUMIN 4.1 11/23/2018 0916   ALBUMIN 3.9 11/24/2016 1104   AST 19 11/23/2018 0916   AST 18 11/24/2016 1104   ALT 13 11/23/2018 0916   ALT 15 11/24/2016 1104   ALKPHOS 80 11/23/2018 0916   ALKPHOS 104 11/24/2016 1104   BILITOT 0.4 11/23/2018 0916   BILITOT 0.57 11/24/2016 1104   GFRNONAA >60 11/23/2018 0916   GFRAA >60 11/23/2018 0916    No results found for: SPEP, UPEP  Lab Results   Component Value Date   WBC 3.2 (L) 11/23/2018   NEUTROABS 1.6 (L) 11/23/2018   HGB 10.5 (  L) 11/23/2018   HCT 32.3 (L) 11/23/2018   MCV 113.3 (H) 11/23/2018   PLT 138 (L) 11/23/2018      Chemistry      Component Value Date/Time   NA 141 11/23/2018 0916   NA 141 11/11/2017 1145   K 3.8 11/23/2018 0916   K 4.0 11/11/2017 1145   CL 103 11/23/2018 0916   CL 103 05/21/2013 1105   CO2 30 11/23/2018 0916   CO2 29 11/11/2017 1145   BUN 14 11/23/2018 0916   BUN 10.9 11/11/2017 1145   CREATININE 0.82 11/23/2018 0916   CREATININE 0.7 11/11/2017 1145      Component Value Date/Time   CALCIUM 9.6 11/23/2018 0916   CALCIUM 9.6 11/11/2017 1145   ALKPHOS 80 11/23/2018 0916   ALKPHOS 104 11/24/2016 1104   AST 19 11/23/2018 0916   AST 18 11/24/2016 1104   ALT 13 11/23/2018 0916   ALT 15 11/24/2016 1104   BILITOT 0.4 11/23/2018 0916   BILITOT 0.57 11/24/2016 1104       RADIOGRAPHIC STUDIES: I have personally reviewed the radiological images as listed and agreed with the findings in the report. Ct Abdomen Pelvis W Contrast  Result Date: 11/06/2018 CLINICAL DATA:  Recurrent ovarian cancer, restaging assessment. EXAM: CT ABDOMEN AND PELVIS WITH CONTRAST TECHNIQUE: Multidetector CT imaging of the abdomen and pelvis was performed using the standard protocol following bolus administration of intravenous contrast. CONTRAST:  175m OMNIPAQUE IOHEXOL 300 MG/ML  SOLN COMPARISON:  07/14/2018 FINDINGS: Lower chest: Stable scarring in the left lower lobe. Mild bibasilar cylindrical bronchiectasis. Descending thoracic aortic atherosclerotic calcification and right coronary artery atherosclerotic calcification. Hepatobiliary: Unremarkable Pancreas: There is a mass interposed between the pancreatic tail, spleen, stomach with primarily cystic elements measuring approximately 5.0 by 3.2 by 3.1 cm cm today, previously by my 4.9 by 3.2 by 1.9 cm. This lesion is inseparable from the upper margin of pancreatic  tail and likewise closely associated with the stomach. Involvement of the gastric wall is a distinct possibility posteriorly. The pancreas appears otherwise unremarkable. Spleen: There is some faint hypodensity in the spleen adjacent to the mass for example on image 20/2, stable, and conceivably representing subtle splenic invasion. Adrenals/Urinary Tract: Pelvic floor laxity with small cystocele. Adrenal glands normal. Small right mid kidney triangular hypodense lesion is likely a cyst but technically too small to characterize. No urinary tract calculi are identified. Stomach/Bowel: As noted above, tumor infiltration of the wall of the gastric cardia posteriorly is not excluded. Prominent stool throughout the colon favors constipation. Vascular/Lymphatic: Aortoiliac atherosclerotic vascular disease. Left periaortic node 0.9 cm in short axis on image 25/2, formerly 0.7 cm. Reproductive: The uterus is absent. The adnexa are indistinct, with some soft tissue nodularity along the right adnexa potentially from right ovary or tumor measuring 2.5 by 3.1 cm on image 54/2 (formerly 2.5 by 1.6 cm) and with left adnexal soft tissue density measuring 2.1 by 2.1 cm on image 54/2 (formerly by my measurement 2.6 by 2.5 cm). Other: Paucity of anterior adipose tissue favoring omentectomy. Musculoskeletal: Right hip prosthesis. Exaggerated lumbar lordosis. Multilevel degenerative grade 1 retrolisthesis at T12-L1, L1-2, and L2-3. IMPRESSION: 1. Mild to moderate enlargement of the left upper quadrant lesion along the gastric wall and pancreatic tail suspicious for malignancy. Subtle invasion of the capsule of the spleen as before. 2. Stable soft tissue nodularity along the right adnexa. Slightly reduced soft tissue prominence along the left adnexa. By report the patient has had bilateral salpingo oophorectomy and accordingly  these adnexal lesions are concerning for tumor. 3. No new areas of tumor nodularity are identified. 4. Other  imaging findings of potential clinical significance: Pelvic floor laxity with small cystocele. Prominent stool throughout the colon favors constipation. Upper normal sized left periaortic lymph node. Bibasilar cylindrical bronchiectasis. Aortic Atherosclerosis (ICD10-I70.0). Coronary atherosclerosis. Electronically Signed   By: Van Clines M.D.   On: 11/06/2018 16:15    All questions were answered. The patient knows to call the clinic with any problems, questions or concerns. No barriers to learning was detected.  I spent 30 minutes counseling the patient face to face. The total time spent in the appointment was 40 minutes and more than 50% was on counseling and review of test results  Heath Lark, MD 11/23/2018 10:34 AM

## 2018-11-23 NOTE — Assessment & Plan Note (Addendum)
I plan reduced dose chemotherapy as above She has some weakness and gait instability. I recommend referral to cancer rehab facility for physical therapy

## 2018-11-23 NOTE — Telephone Encounter (Signed)
Scheduled appt per 12/26 los - gave patient AVS and calender per los.   

## 2018-11-23 NOTE — Assessment & Plan Note (Signed)
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 We discussed importance of Advanced Directives and Living will. She has advanced directives and living will at home.  Her husband, Levada Dy is the medical healthcare power of attorney

## 2018-11-23 NOTE — Assessment & Plan Note (Signed)
Imaging studies showed significant fecal loading We discussed importance of aggressive laxative therapy

## 2018-11-25 ENCOUNTER — Inpatient Hospital Stay: Payer: Medicare Other

## 2018-11-25 VITALS — BP 121/71 | HR 68 | Temp 98.4°F | Resp 16

## 2018-11-25 DIAGNOSIS — Z5111 Encounter for antineoplastic chemotherapy: Secondary | ICD-10-CM | POA: Diagnosis not present

## 2018-11-25 DIAGNOSIS — C569 Malignant neoplasm of unspecified ovary: Secondary | ICD-10-CM

## 2018-11-25 DIAGNOSIS — Z7189 Other specified counseling: Secondary | ICD-10-CM

## 2018-11-25 MED ORDER — DIPHENHYDRAMINE HCL 50 MG/ML IJ SOLN
INTRAMUSCULAR | Status: AC
  Filled 2018-11-25: qty 1

## 2018-11-25 MED ORDER — SODIUM CHLORIDE 0.9 % IV SOLN
281.6000 mg | Freq: Once | INTRAVENOUS | Status: AC
  Administered 2018-11-25: 280 mg via INTRAVENOUS
  Filled 2018-11-25: qty 28

## 2018-11-25 MED ORDER — SODIUM CHLORIDE 0.9 % IV SOLN
Freq: Once | INTRAVENOUS | Status: AC
  Administered 2018-11-25: 08:00:00 via INTRAVENOUS
  Filled 2018-11-25: qty 250

## 2018-11-25 MED ORDER — SODIUM CHLORIDE 0.9% FLUSH
10.0000 mL | INTRAVENOUS | Status: DC | PRN
  Administered 2018-11-25: 10 mL
  Filled 2018-11-25: qty 10

## 2018-11-25 MED ORDER — SODIUM CHLORIDE 0.9 % IV SOLN
20.0000 mg | Freq: Once | INTRAVENOUS | Status: AC
  Administered 2018-11-25: 20 mg via INTRAVENOUS
  Filled 2018-11-25: qty 2

## 2018-11-25 MED ORDER — SODIUM CHLORIDE 0.9 % IV SOLN
122.5000 mg/m2 | Freq: Once | INTRAVENOUS | Status: AC
  Administered 2018-11-25: 198 mg via INTRAVENOUS
  Filled 2018-11-25: qty 33

## 2018-11-25 MED ORDER — HEPARIN SOD (PORK) LOCK FLUSH 100 UNIT/ML IV SOLN
500.0000 [IU] | Freq: Once | INTRAVENOUS | Status: AC | PRN
  Administered 2018-11-25: 500 [IU]
  Filled 2018-11-25: qty 5

## 2018-11-25 MED ORDER — FAMOTIDINE IN NACL 20-0.9 MG/50ML-% IV SOLN
20.0000 mg | Freq: Once | INTRAVENOUS | Status: AC
  Administered 2018-11-25: 20 mg via INTRAVENOUS

## 2018-11-25 MED ORDER — DIPHENHYDRAMINE HCL 50 MG/ML IJ SOLN
50.0000 mg | Freq: Once | INTRAMUSCULAR | Status: AC
  Administered 2018-11-25: 50 mg via INTRAVENOUS

## 2018-11-25 MED ORDER — FAMOTIDINE IN NACL 20-0.9 MG/50ML-% IV SOLN
INTRAVENOUS | Status: AC
  Filled 2018-11-25: qty 50

## 2018-11-25 MED ORDER — PALONOSETRON HCL INJECTION 0.25 MG/5ML
INTRAVENOUS | Status: AC
  Filled 2018-11-25: qty 5

## 2018-11-25 MED ORDER — PALONOSETRON HCL INJECTION 0.25 MG/5ML
0.2500 mg | Freq: Once | INTRAVENOUS | Status: AC
  Administered 2018-11-25: 0.25 mg via INTRAVENOUS

## 2018-11-25 NOTE — Patient Instructions (Signed)
[facility] Discharge Instructions for Patients Receiving Chemotherapy  Today you received the following chemotherapy agents:  Taxol, Carboplatin  To help prevent nausea and vomiting after your treatment, we encourage you to take your nausea medication as prescribed.   If you develop nausea and vomiting that is not controlled by your nausea medication, call the clinic.   BELOW ARE SYMPTOMS THAT SHOULD BE REPORTED IMMEDIATELY:  *FEVER GREATER THAN 100.5 F  *CHILLS WITH OR WITHOUT FEVER  NAUSEA AND VOMITING THAT IS NOT CONTROLLED WITH YOUR NAUSEA MEDICATION  *UNUSUAL SHORTNESS OF BREATH  *UNUSUAL BRUISING OR BLEEDING  TENDERNESS IN MOUTH AND THROAT WITH OR WITHOUT PRESENCE OF ULCERS  *URINARY PROBLEMS  *BOWEL PROBLEMS  UNUSUAL RASH Items with * indicate a potential emergency and should be followed up as soon as possible.  Feel free to call the clinic should you have any questions or concerns. The clinic phone number is [phone].  Please show the CHEMO ALERT CARD at check-in to the Emergency Department and triage nurse.   

## 2018-11-27 ENCOUNTER — Telehealth: Payer: Self-pay | Admitting: *Deleted

## 2018-11-27 NOTE — Telephone Encounter (Signed)
Telephone call to patient to check status following ON CALL Triage Clinical biochemist received from [facility]. Patient had called on 12/28 with concerns of a "flat red rash on her face and neck" no respiratory distress was noted. Patient reports she figured out it was her Rosacea acting up. Patient reports this was not related to her recent treatment and it is now under control.

## 2018-12-04 ENCOUNTER — Other Ambulatory Visit: Payer: Self-pay

## 2018-12-04 ENCOUNTER — Ambulatory Visit: Payer: Medicare Other | Attending: Hematology and Oncology | Admitting: Rehabilitation

## 2018-12-04 ENCOUNTER — Encounter: Payer: Self-pay | Admitting: Rehabilitation

## 2018-12-04 DIAGNOSIS — R262 Difficulty in walking, not elsewhere classified: Secondary | ICD-10-CM | POA: Diagnosis present

## 2018-12-04 DIAGNOSIS — R293 Abnormal posture: Secondary | ICD-10-CM | POA: Diagnosis present

## 2018-12-04 DIAGNOSIS — R2689 Other abnormalities of gait and mobility: Secondary | ICD-10-CM

## 2018-12-04 NOTE — Patient Instructions (Signed)
given [place] handout with explanation due to patient wanting HEP only despite encouragement for in clinic appointments Keep using [facility] out of the home

## 2018-12-04 NOTE — Therapy (Signed)
[place], [place], [address] Phone: [phone]   Fax:  [phone]  Physical Therapy Evaluation  Patient Details  Name: [name] MRN: [id number] Date of Birth: 1941/01/28 Referring Provider (PT): Dr Alvy Bimler   Encounter Date: 12/04/2018  PT End of Session - 12/04/18 1037    Visit Number  1    Number of Visits  1    Authorization Type  Medicare    PT Start Time  0930    PT Stop Time  1016    PT Time Calculation (min)  46 min    Activity Tolerance  Patient tolerated treatment well    Behavior During Therapy  [facility] for tasks assessed/performed       Past Medical History:  Diagnosis Date  . Arthritis    OSTEOARTHRITIS   -- CONSTANT PAIN RIGHT HIP---AND PAIN LEFT KNEE--PT STATES SHE GETS INJECTIONS INTO HER KNEE  . Complication of anesthesia    BLOOD PRESSURE DROPPED WITH NASAL SURGERY, ONE OF THE CARPAL TUNNEL REPAIRS AND DURING A COLONOSCOPY  . Dyslipidemia   . History of skin cancer   . Hypertension   . Hypothyroidism   . Menopausal symptoms   . NSTEMI (non-ST elevated myocardial infarction) ([place]) 12/09/15   Medical management: Distal branch of D1 95%, ostial D2 75%, distal LAD 50%. Tortuous arteries consistent with hypertension  . Ovarian cancer ([place]) 01/2013   Recurrence since 2014/2016  . PAF (paroxysmal atrial fibrillation) ([place])    On ELIQUIS; On Amiodarone (Eye Exam 12/25/15) - no longer on Rythmol  . Stroke [facility]) 2017   no deficits    Past Surgical History:  Procedure Laterality Date  . BILATERAL CARPAL TUNNEL REPAIR  2007  . BREAST BIOPSY Left 03/19/2014   benign  . CARDIAC CATHETERIZATION N/A 12/05/2015   Procedure: Left Heart Cath and Coronary Angiography;  Surgeon: Belva Crome, MD;  Location: [facility];  Service: Cardiovascular;  distal branch of D1 95%, ostial D2 75%, dLAD 50%,p-mRCA 40%. Tortuous vessels.  Marland Kitchen DILATION AND CURETTAGE OF UTERUS  1969  . JOINT  REPLACEMENT    . LAPAROTOMY Bilateral 02/13/2013   Procedure: EXPLORATORY LAPAROTOMY TOTAL ABDOMINAL HYSTERECTOMY BILATERAL SALPINGO-OOPHORECTOMY, Partial Rectal Resection with Reanastamosis;  Surgeon: Alvino Chapel, MD;  Location: [facility];  Service: Gynecology;  Laterality: Bilateral;  . LEFT KNEE ARTHROSCOPY   2011  . LYMPHADENECTOMY Right 02/13/2013   Procedure: PEVLIC  LYMPHADENECTOMY, DEBULKING right pelvic tumor nodules;  Surgeon: Alvino Chapel, MD;  Location: [facility];  Service: Gynecology;  Laterality: Right;  . NM MYOVIEW LTD  June 2010    subbmaximal with no ischemia or infarction.  . OMENTECTOMY  02/13/2013   Procedure: OMENTECTOMY;  Surgeon: Alvino Chapel, MD;  Location: [facility];  Service: Gynecology;;  . [place]  . SURGERY FOR RUPTURED OVARIAN CYST  1969  . SYNOVECTOMY WITH POLY EXCHANGE Left 03/18/2017   Procedure: LEFT KNEE PARTIAL SYNOVECTOMY WITH POLY EXCHANGE;  Surgeon: Mcarthur Rossetti, MD;  Location: [facility];  Service: Orthopedics;  Laterality: Left;  Adductor Block  . TAH/BSO/Tumor debulking with right pelvic LND  01/2013  . TONSILLECTOMY  1962  . TOTAL HIP ARTHROPLASTY  02/25/2012   Procedure: TOTAL HIP ARTHROPLASTY ANTERIOR APPROACH;  Surgeon: Mcarthur Rossetti, MD;  Location: [facility];  Service: Orthopedics;  Laterality: Right;  . TOTAL KNEE ARTHROPLASTY Left 01/03/2015   Procedure: LEFT TOTAL KNEE ARTHROPLASTY;  Surgeon: Lind Guest  Ninfa Linden, MD;  Location: [facility];  Service: Orthopedics;  Laterality: Left;  . TRANSTHORACIC ECHOCARDIOGRAM  May 2014; January 2016   a. Normal LV size function. EF 60-65%. Grade 1 diastolic function. Mild MR and mildly elevated PA pressures of 37 mmHg per;; b. EF 60-65%. Mobile echodensity 10 mm x 6 mm attest interventricular septum is questionable fibroblastoma.  Marland Kitchen TRIGGER FINGER RELEASE Left 12/20/2017   Procedure: RELEASE TRIGGER FINGER/A-1 PULLEY LEFT RING FINGER;  Surgeon: Daryll Brod, MD;  Location: [facility];  Service: Orthopedics;  Laterality: Left;    There were no vitals filed for this visit.   Subjective Assessment - 12/04/18 0934    Subjective  I have some neuropathy and a very bad low back.  It takes me a few minutes to get going. [place] a year ago and broke my elbow and started using the cane.     Pertinent History  11.5cm ovarian carcinoma with tumor necrosis on the right and 2.3cm on the left. 7 lymph nodes removed all negative.  02/13/13 abdominal hysterectomy, bilateral salpingo-oophorectomy, partial rectal resection. Followed by Chemotherapy 6 cycles of carboplatin and taxol in 2014. Return of malignancy in the spleen and retroperitoneum followed by more chemotherapy in 2016, 8 cycles of carboplatin and taxol. Progression of peritoneal mets in 2064followed by more chemotherapy carboplatin x 5 cycles 04/10/18-07/17/17 and Zejula 08/07/18-11/07/18. Now back on chemotherapy as of 11/25/18 due to scan findings.  Treatment is now palliative.  History of Lt TKR 2016, Rt THR 2013, paroxysmal a fib.  Bad arthritis in the Rt foot which causes it to turn out.  Now a mild neuropathy in the feet and finger tips.     Limitations  Walking   a bit more fear of falling    How long can you walk comfortably?  walks every day 41minutes     Patient Stated Goals  not sure.  maybe learn some exercises for my stability     Currently in Pain?  No/denies         [facility] PT Assessment - 12/04/18 0001      Assessment   Medical Diagnosis  metastatic uterine carcinoma    Referring Provider (PT)  Dr Alvy Bimler    Onset Date/Surgical Date  02/13/13    Hand Dominance  Right    Next MD Visit  for infusion    Prior Therapy  no      Precautions   Precautions  Fall;Other (comment)    Precaution Comments  history of low blood counts, palliative care      Restrictions   Weight Bearing Restrictions  No      Balance Screen   Has the patient fallen in the past 6 months  No    last fall was in June on a boardwalk    Has the patient had a decrease in activity level because of a fear of falling?   Yes    Is the patient reluctant to leave their home because of a fear of falling?   Yes      [place] residence    Living Arrangements  Spouse/significant other    Type of [place] to enter    Entrance Stairs-Number of Steps  [place]  One level      Prior Function   Level of Independence  Independent    Vocation  Retired  Leisure  walking      Cognition   Overall Cognitive Status  Within Functional Limits for tasks assessed      Sensation   Additional Comments  mild numbness reported whole bottom of foot bilateral.       Coordination   Gross Motor Movements are Fluid and Coordinated  Yes      Functional Tests   Functional tests  Step up      Step Up   Comments  unable to do bil      Posture/Postural Control   Posture/Postural Control  Postural limitations    Postural Limitations  Rounded Shoulders;Forward head;Decreased lumbar lordosis;Increased thoracic kyphosis;Flexed trunk;Weight shift right    Posture Comments  due to scoliosis, knee, and foot OA, lumbar DDD      ROM / Strength   AROM / PROM / Strength  Strength      Strength   Overall Strength Comments  seated testing; at least 4/5 globally unable to fully extend knees due to hamstring tightness; pt reports whenever she tries to stretch them her back hurts too much.     Strength Assessment Site  Hip    Right/Left Hip  Right;Left      Ambulation/Gait   Ambulation/Gait  Yes    Gait Comments  ambulating wtihout SPC but has one as needed; trunk lean to the right; decreased arm swing due to scoliosis and DDD whole lumbar spine, decreased knee extension due to TKR and Rt foot inversio due to foot OA, not much change with the use of the [facility]      Standardized Balance Assessment   Standardized Balance Assessment  Timed Up and  Go Test      Timed Up and Go Test   Normal TUG (seconds)  14.6    TUG Comments  using SPC 19ft distance slower than average      High Level Balance   High Level Balance Comments  [place] scale; scoring 26/40 with fall risk cut off at 25/40.  lower scores on step up and tandem walk                Objective measurements completed on examination: See above findings.      [facility] Adult PT Treatment/Exercise - 12/04/18 0001      Exercises   Exercises  Other Exercises    Other Exercises   given [facility] handout with explanation due to patient wanting HEP only despite encouragement for in clinic appointments             PT Education - 12/04/18 1037    Education Details  [place], walking, cane use    Person(s) Educated  Patient    Methods  Explanation;Demonstration;Handout    Comprehension  Verbalized understanding          PT Long Term Goals - 12/04/18 1043      PT LONG TERM GOAL #1   Title  Pt will be ind with [facility] home program     Time  1    Period  Days    Status  Achieved      PT LONG TERM GOAL #2   Title  Pt will be knowledgeable about cane use in the community and fall precautions     Time  1    Period  Days    Status  Achieved             Plan - 12/04/18 1038    Clinical Impression Statement  Pt presents  today reporting that her mobility and stability have decreased recently due to what she thinks is worsening of OA in her feet and low back.  Pt reports mild neuropathy that is worsening during chemo infusions in the feet and fingers.  Pt is not agreeable to in clinic PT visits at this time reporting that she just would not like to have any more appointments at this time.  Deficits include severe Lt foot OA which results in foot inversion position with swing and at rest, sig scoliosis and lumbar DDD resulting in a trunk lean, and evident trendelenberg with gait.  MMT is 4/5 globally without pain feet not tested.  Pt demonstrates a moderate fall risk  on the [facility] balance scale and the TUG with no recent falls but a history of falls.  Pt was educated on [place] program for home and to continue walking.  Pt agreeable to return if things worsen     History and Personal Factors relevant to plan of care:  11.5cm ovarian carcinoma with tumor necrosis on the right and 2.3cm on the left. 7 lymph nodes removed all negative. 02/13/13 abdominal hysterectomy, bilateral salpingo-oophorectomy, partial rectal resection. Followed by Chemotherapy 6 cycles of carboplatin and taxol in 2014. Return of malignancy in the spleen and retroperitoneum followed by more chemotherapy in 2016, 8 cycles of carboplatin and taxol. Progression of peritoneal mets in 2025followed by more chemotherapy carboplatin x 5 cycles 04/10/18-07/17/17 and Zejula 08/07/18-11/07/18. Now back on chemotherapy as of 11/25/18 due to scan findings. Treatment is now palliative. History of Lt TKR 2016, Rt THR 2013, paroxysmal a fib. Bad arthritis in the Rt foot which causes it to turn out. Now a mild neuropathy in the feet and finger tips.     Clinical Presentation  Evolving    Clinical Presentation due to:  active chemo and continued cancer presence    Clinical Decision Making  Moderate    Rehab Potential  Fair    PT Frequency  One time visit    PT Treatment/Interventions  ADLs/Self Care Home Management;Therapeutic exercise    PT Home Exercise Plan  [facility]    Consulted and Agree with Plan of Care  Patient       Patient will benefit from skilled therapeutic intervention in order to improve the following deficits and impairments:  Decreased balance, Abnormal gait  Visit Diagnosis: Other abnormalities of gait and mobility - Plan: PT plan of care cert/re-cert  Abnormal posture - Plan: PT plan of care cert/re-cert  Difficulty in walking, not elsewhere classified - Plan: PT plan of care cert/re-cert     Problem List Patient Active Problem List   Diagnosis Date Noted  . Chemotherapy-induced nausea  08/15/2018  . Pancytopenia, acquired ([place]) 05/02/2018  . Goals of care, counseling/discussion 04/03/2018  . Chronic right-sided low back pain without sciatica 08/29/2017  . Closed nondisplaced fracture of head of right radius 08/15/2017  . Failed total left knee replacement ([place]) 03/18/2017  . Status post revision of total knee replacement, left 03/18/2017  . Thrombocytopenia ([place]) 03/27/2016  . High risk medications (not anticoagulants) long-term use 03/27/2016  . Chronic anticoagulation 03/27/2016  . Atherosclerotic heart disease of native coronary artery without angina pectoris 03/03/2016  . Bilateral hearing loss 02/16/2016  . Bilateral impacted cerumen 02/16/2016  . Rhinitis, chronic 02/16/2016  . Embolic stroke ([place]) 78/24/2353  . Vaginal atrophy 12/29/2015  . On amiodarone therapy 12/23/2015  . Dyslipidemia, goal LDL below 70   . Stroke with cerebral ischemia ([place])   .  Visual disturbance   . Cerebral thrombosis with cerebral infarction 12/08/2015  . NSTEMI (non-ST elevated myocardial infarction) ([place]) 12/04/2015  . 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 Chemotherapy-induced peripheral neuropathy ([place]) 07/23/2015  . Portacath in place 07/23/2015  . Recurrent carcinoma of ovary ([place]) 07/23/2015  . International Federation of Gynecology and Obstetrics (FIGO) 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[place]) 07/23/2015  . Genetic testing 07/14/2015  . Encounter for antineoplastic chemotherapy 06/25/2015  . Port catheter in place 06/25/2015  . Antineoplastic chemotherapy induced pancytopenia ([place]) 06/02/2015  . Dehydration 06/02/2015  . 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 Chemotherapy induced neutropenia ([place]) 05/13/2015  . Chemotherapy induced thrombocytopenia 05/13/2015  . Hyperbilirubinemia 05/06/2015  . Osteoarthritis of left knee 01/03/2015  . Status post total left knee replacement 01/03/2015  . PAF (paroxysmal atrial  fibrillation) ([place]): Symptomatic - Rhythm control with Amiodarone. CHA2DS2Vasc Score 7: On Eliquis   . Fever 05/03/2013    Class: Acute  . Nausea 05/03/2013    Class: Acute  . Other constipation 05/03/2013    Class: Acute  . Syncope, history of 04/19/2013  . Essential hypertension 04/19/2013  . Hypothyroidism 04/19/2013  . Ovarian cancer on right ([place]) 03/24/2013  . Degenerative arthritis of hip 02/25/2012    Shan Levans, PT 12/04/2018, 10:46 AM  [place], [place], [address] Phone: [phone]   Fax:  [phone]  Name: SREYA FROIO MRN: [id number] Date of Birth: Dec 01, 1940

## 2018-12-07 ENCOUNTER — Other Ambulatory Visit: Payer: Self-pay | Admitting: Cardiology

## 2018-12-10 ENCOUNTER — Other Ambulatory Visit: Payer: Self-pay | Admitting: Hematology and Oncology

## 2018-12-10 DIAGNOSIS — C569 Malignant neoplasm of unspecified ovary: Secondary | ICD-10-CM

## 2018-12-10 DIAGNOSIS — Z7189 Other specified counseling: Secondary | ICD-10-CM

## 2018-12-18 ENCOUNTER — Inpatient Hospital Stay: Payer: Medicare Other

## 2018-12-18 ENCOUNTER — Inpatient Hospital Stay: Payer: Medicare Other | Attending: Hematology and Oncology

## 2018-12-18 ENCOUNTER — Inpatient Hospital Stay (HOSPITAL_BASED_OUTPATIENT_CLINIC_OR_DEPARTMENT_OTHER): Payer: Medicare Other | Admitting: Hematology and Oncology

## 2018-12-18 ENCOUNTER — Encounter: Payer: Self-pay | Admitting: Hematology and Oncology

## 2018-12-18 DIAGNOSIS — C569 Malignant neoplasm of unspecified ovary: Secondary | ICD-10-CM | POA: Diagnosis not present

## 2018-12-18 DIAGNOSIS — K5909 Other constipation: Secondary | ICD-10-CM | POA: Diagnosis not present

## 2018-12-18 DIAGNOSIS — Z79899 Other long term (current) drug therapy: Secondary | ICD-10-CM | POA: Insufficient documentation

## 2018-12-18 DIAGNOSIS — D61818 Other pancytopenia: Secondary | ICD-10-CM

## 2018-12-18 DIAGNOSIS — T451X5S Adverse effect of antineoplastic and immunosuppressive drugs, sequela: Secondary | ICD-10-CM

## 2018-12-18 DIAGNOSIS — T451X5A Adverse effect of antineoplastic and immunosuppressive drugs, initial encounter: Secondary | ICD-10-CM

## 2018-12-18 DIAGNOSIS — Z7189 Other specified counseling: Secondary | ICD-10-CM

## 2018-12-18 DIAGNOSIS — G620 Drug-induced polyneuropathy: Secondary | ICD-10-CM | POA: Insufficient documentation

## 2018-12-18 DIAGNOSIS — Z5111 Encounter for antineoplastic chemotherapy: Secondary | ICD-10-CM | POA: Insufficient documentation

## 2018-12-18 LAB — CMP (CANCER CENTER ONLY)
ALT: 12 U/L (ref 0–44)
AST: 14 U/L — ABNORMAL LOW (ref 15–41)
Albumin: 4.1 g/dL (ref 3.5–5.0)
Alkaline Phosphatase: 91 U/L (ref 38–126)
Anion gap: 10 (ref 5–15)
BUN: 16 mg/dL (ref 8–23)
CO2: 25 mmol/L (ref 22–32)
CREATININE: 0.81 mg/dL (ref 0.44–1.00)
Calcium: 9.5 mg/dL (ref 8.9–10.3)
Chloride: 104 mmol/L (ref 98–111)
GFR, Est AFR Am: >60 mL/min (ref >60)
GFR, Estimated: >60 mL/min (ref >60)
Glucose, Bld: 210 mg/dL — ABNORMAL HIGH (ref 70–99)
Potassium: 3.8 mmol/L (ref 3.5–5.1)
Sodium: 139 mmol/L (ref 135–145)
Total Bilirubin: 0.5 mg/dL (ref 0.3–1.2)
Total Protein: 6.6 g/dL (ref 6.5–8.1)

## 2018-12-18 LAB — CBC WITH DIFFERENTIAL (CANCER CENTER ONLY)
ABS IMMATURE GRANULOCYTES: 0.02 10*3/uL (ref 0.00–0.07)
Basophils Absolute: 0 10*3/uL (ref 0.0–0.1)
Basophils Relative: 0 %
Eosinophils Absolute: 0 10*3/uL (ref 0.0–0.5)
Eosinophils Relative: 0 %
HCT: 31.4 % — ABNORMAL LOW (ref 36.0–46.0)
Hemoglobin: 10.4 g/dL — ABNORMAL LOW (ref 12.0–15.0)
Immature Granulocytes: 0 %
Lymphocytes Relative: 7 %
Lymphs Abs: 0.3 10*3/uL — ABNORMAL LOW (ref 0.7–4.0)
MCH: 35.5 pg — ABNORMAL HIGH (ref 26.0–34.0)
MCHC: 33.1 g/dL (ref 30.0–36.0)
MCV: 107.2 fL — ABNORMAL HIGH (ref 80.0–100.0)
Monocytes Absolute: 0.2 10*3/uL (ref 0.1–1.0)
Monocytes Relative: 3 %
NEUTROS ABS: 4.2 10*3/uL (ref 1.7–7.7)
Neutrophils Relative %: 90 %
Platelet Count: 132 10*3/uL — ABNORMAL LOW (ref 150–400)
RBC: 2.93 MIL/uL — ABNORMAL LOW (ref 3.87–5.11)
RDW: 13 % (ref 11.5–15.5)
WBC Count: 4.7 10*3/uL (ref 4.0–10.5)
nRBC: 0 % (ref 0.0–0.2)

## 2018-12-18 MED ORDER — FAMOTIDINE IN NACL 20-0.9 MG/50ML-% IV SOLN
INTRAVENOUS | Status: AC
  Filled 2018-12-18: qty 50

## 2018-12-18 MED ORDER — PALONOSETRON HCL INJECTION 0.25 MG/5ML
INTRAVENOUS | Status: AC
  Filled 2018-12-18: qty 5

## 2018-12-18 MED ORDER — SODIUM CHLORIDE 0.9 % IV SOLN
Freq: Once | INTRAVENOUS | Status: AC
  Administered 2018-12-18: 09:00:00 via INTRAVENOUS
  Filled 2018-12-18: qty 250

## 2018-12-18 MED ORDER — HEPARIN SOD (PORK) LOCK FLUSH 100 UNIT/ML IV SOLN
500.0000 [IU] | Freq: Once | INTRAVENOUS | Status: AC | PRN
  Administered 2018-12-18: 500 [IU]
  Filled 2018-12-18: qty 5

## 2018-12-18 MED ORDER — SODIUM CHLORIDE 0.9% FLUSH
10.0000 mL | Freq: Once | INTRAVENOUS | Status: AC
  Administered 2018-12-18: 10 mL
  Filled 2018-12-18: qty 10

## 2018-12-18 MED ORDER — SODIUM CHLORIDE 0.9 % IV SOLN
20.0000 mg | Freq: Once | INTRAVENOUS | Status: AC
  Administered 2018-12-18: 20 mg via INTRAVENOUS
  Filled 2018-12-18: qty 2

## 2018-12-18 MED ORDER — DIPHENHYDRAMINE HCL 50 MG/ML IJ SOLN
50.0000 mg | Freq: Once | INTRAMUSCULAR | Status: AC
  Administered 2018-12-18: 50 mg via INTRAVENOUS

## 2018-12-18 MED ORDER — SODIUM CHLORIDE 0.9 % IV SOLN
280.0000 mg | Freq: Once | INTRAVENOUS | Status: AC
  Administered 2018-12-18: 280 mg via INTRAVENOUS
  Filled 2018-12-18: qty 28

## 2018-12-18 MED ORDER — SODIUM CHLORIDE 0.9 % IV SOLN
122.5000 mg/m2 | Freq: Once | INTRAVENOUS | Status: AC
  Administered 2018-12-18: 198 mg via INTRAVENOUS
  Filled 2018-12-18: qty 33

## 2018-12-18 MED ORDER — SODIUM CHLORIDE 0.9% FLUSH
10.0000 mL | INTRAVENOUS | Status: DC | PRN
  Administered 2018-12-18: 10 mL
  Filled 2018-12-18: qty 10

## 2018-12-18 MED ORDER — FAMOTIDINE IN NACL 20-0.9 MG/50ML-% IV SOLN
20.0000 mg | Freq: Once | INTRAVENOUS | Status: AC
  Administered 2018-12-18: 20 mg via INTRAVENOUS

## 2018-12-18 MED ORDER — PALONOSETRON HCL INJECTION 0.25 MG/5ML
0.2500 mg | Freq: Once | INTRAVENOUS | Status: AC
  Administered 2018-12-18: 0.25 mg via INTRAVENOUS

## 2018-12-18 MED ORDER — DIPHENHYDRAMINE HCL 50 MG/ML IJ SOLN
INTRAMUSCULAR | Status: AC
  Filled 2018-12-18: qty 1

## 2018-12-18 NOTE — Assessment & Plan Note (Signed)
She has chronic pancytopenia, stable with recent chemotherapy We will proceed with similar dose adjustment as before

## 2018-12-18 NOTE — Patient Instructions (Signed)
   [facility] Discharge Instructions for Patients Receiving Chemotherapy  Today you received the following chemotherapy agents Taxol and Carboplatin   To help prevent nausea and vomiting after your treatment, we encourage you to take your nausea medication as directed.    If you develop nausea and vomiting that is not controlled by your nausea medication, call the clinic.   BELOW ARE SYMPTOMS THAT SHOULD BE REPORTED IMMEDIATELY:  *FEVER GREATER THAN 100.5 F  *CHILLS WITH OR WITHOUT FEVER  NAUSEA AND VOMITING THAT IS NOT CONTROLLED WITH YOUR NAUSEA MEDICATION  *UNUSUAL SHORTNESS OF BREATH  *UNUSUAL BRUISING OR BLEEDING  TENDERNESS IN MOUTH AND THROAT WITH OR WITHOUT PRESENCE OF ULCERS  *URINARY PROBLEMS  *BOWEL PROBLEMS  UNUSUAL RASH Items with * indicate a potential emergency and should be followed up as soon as possible.  Feel free to call the clinic should you have any questions or concerns. The clinic phone number is [phone].  Please show the CHEMO ALERT CARD at check-in to the Emergency Department and triage nurse.   

## 2018-12-18 NOTE — Assessment & Plan Note (Signed)
She has chronic constipation, resolved with aggressive laxative therapy and Linzess.  She will continue the same.

## 2018-12-18 NOTE — Assessment & Plan Note (Signed)
So far, she tolerated chemotherapy well with expected side effects such as mild pancytopenia, constipation and neuropathy We will proceed with cycle 2 with similar dose adjustment as before I recommend minimum 3 cycles of chemo before repeat imaging studies

## 2018-12-18 NOTE — Progress Notes (Signed)
[place] OFFICE PROGRESS NOTE  Patient Care Team: Shon Baton, MD as PCP - General (Internal Medicine)  ASSESSMENT & PLAN:  Recurrent carcinoma of ovary ([name]) So far, she tolerated chemotherapy well with expected side effects such as mild pancytopenia, constipation and neuropathy We will proceed with cycle 2 with similar dose adjustment as before I recommend minimum 3 cycles of chemo before repeat imaging studies  Pancytopenia, acquired ([name]) She has chronic pancytopenia, stable with recent chemotherapy We will proceed with similar dose adjustment as before  Chemotherapy-induced peripheral neuropathy ([name]) She does not think that her neuropathy is worse.  We will continue reduced dose treatment.  Other constipation She has chronic constipation, resolved with aggressive laxative therapy and Linzess.  She will continue the same.   No orders of the defined types were placed in this encounter.   INTERVAL HISTORY: Please see below for problem oriented charting. She returns for further follow-up She tolerated cycle 1 of treatment well except for some mild constipation.  She did not think her neuropathy is worse No recent infection, fever or chills.  Her abdominal bloating is not worse. The patient denies any recent signs or symptoms of bleeding such as spontaneous epistaxis, hematuria or hematochezia.   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 Progressed on Aromasin, switched to Tamoxifen Progressed on Zejula     Malignant neoplasm of ovary ([name]) (Resolved)   03/11/2013 Initial Diagnosis    Malignant neoplasm of ovary     Recurrent carcinoma of ovary ([name])   01/05/2013 Tumor Marker    Patient's tumor was tested for the following markers: CA-125 Results of the tumor marker test revealed 178.8    01/17/2013 Imaging     CT scan of abdomen  10.0 cm complex right ovarian neoplasm, with findings worrisome for malignancy.  Surgical consultation is advised.  No findings to suggest metastatic disease in the abdomen/pelvis    02/13/2013 Pathology Results    1. Adnexa - ovary +/- tube, neoplastic - HIGH GRADE OVARIAN POORLY DIFFERENTIATED CARCINOMA WITH ASSOCIATED TUMOR NECROSIS, 11.5 CM, INVOLVING THE OVARIAN CAPSULE AND ADJACENT FALLOPIAN TUBE. - PLEASE SEE ONCOLOGY TEMPLATE FOR DETAILS. 2. Ovary and fallopian tube, left - HIGH GRADE OVARIAN POORLY DIFFERENTIATED CARCINOMA WITH ASSOCIATED TUMOR NECROSIS, 2.3 CM, INVOLVING THE OVARIAN CAPSULE. - FALLOPIAN TUBAL TISSUE, NO EVIDENCE OF MALIGNANCY. - PLEASE SEE ONCOLOGY TEMPLATE FOR DETAILS. 3. Uterus and cervix - ENDOMETRIUM: BENIGN WEAKLY PROLIFERATIVE ENDOMETRIUM, NO ATYPIA, HYPERPLASIA OR MALIGNANCY. - CERVIX: BENIGN SQUAMOUS MUCOSA AND ENDOCERVICAL MUCOSA, NO DYSPLASIA OR MALIGNANCY. - UTERINE SEROSA: ADHESIONS, NO EVIDENCE OF ENDOMETRIOSIS, ATYPIA OR MALIGNANCY. 4. Cul-de-sac biopsy - POSITIVE FOR METASTATIC CARCINOMA (2.0 CM). 5. Soft tissue mass, simple excision, peri rectal - POSITIVE FOR METASTATIC CARCINOMA (2.6 CM). 6. Lymph nodes, regional resection, right pelvic - SEVEN LYMPH NODES, NEGATIVE FOR METASTATIC CARCINOMA (0/7). 7. Omentum, resection for tumor - MATURE ADIPOSE TISSUE, NO EVIDENCE OF MALIGNANCY. Microscopic Comment 1. OVARY Specimen(s): Right and left ovaries and fallopian tubes, uterus and cervix Procedure(s): Total hysterectomy, bilateral salpingo-oophorectomy Lymph node sampling performed: Yes Primary tumor site: Right and left ovaries Ovarian surface involvement: Present Maximum tumor size (cm): Right ovary: 11.5 cm; left ovary: 2.3 cm, gross measurement. 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 please see comment. Grade: High grade Peritoneal implants:  No Pelvic extension: Cul-de-sac biopsy and perirectal  soft tissue biopsy: positive Peritoneal washings: Rare cluster of atypical cells present ([id number]) Lymph nodes: number examined 7 ; number positive 0 TNM code: pT2c, pN0 FIGO Stage (based on pathologic findings, needs clinical correlation): II B (2014 edition) Comments: Sections of bilateral ovaries show sheets and nests of malignant epithelium resembling transitional cell carcinoma with associated tumor necrosis. No evidence of Brenner's tumor or conventional endometrioid carcinoma is identified in the background. The bilateral ovarian capsules are involved. Immunohistochemical stains were performed and the malignant cells are strongly positive for cytokeratin 7, WT-1, p16 and p53, negative for cytokeratin 20. 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 The tumor involves the right fallopian tube. Both cul-de-sac and perirectal soft tissue biopsies are positive for metastatic carcinoma. The omentum biopsy is negative for tumor.     03/20/2013 - 08/14/2013 Chemotherapy    The patient had 6 cycles of carboplatin and taxol    09/25/2013 Imaging    1. Interval resection of previously described right pelvic mass. No evidence of recurrent or metastatic disease. 2. Small hiatal hernia. 3. Moderate amount of stool within the rectum. Question constipation or even mild fecal impaction.    04/03/2015 PET scan    1. Highly hypermetabolic height bowed dense mass in the medial spleen compatible with malignancy. 2. Bilateral pelvic sidewall adenopathy, right greater than left, hypermetabolic, with small peritoneal implants of tumor in the lower pelvis, and with several faint but suspected omental implants of tumor. There is also a hypermetabolic aortocaval lymph node in the retroperitoneum. 3. Generalized increased thyroid activity could be due to  thyroiditis. Although a well-defined nodule is not seen in the right thyroid lobe, there is focal increased hypermetabolic activity in the right thyroid lobe. Thyroid ultrasound is recommended for further investigation. 4. I suspect that the high activity between the spinous processes of L3 and L4 is degenerative.    05/01/2015 - 10/21/2015 Chemotherapy    The patient had 8 cycles of carboplatin, taxol and Avastin.      07/02/2015 Imaging    1. Decrease in size of these splenic lesion. Suspect tumor also involving the adjacent posterior wall of the stomach. 2. Improved pelvic lymphadenopathy. 3. No Tina omental or peritoneal surface disease.     09/10/2015 Imaging    1. Response to therapy, with further decreased size of a medial splenic lesion. The previously questioned extension into the stomach is no longer identified and may have been artifactual on the prior exam. Of note, an inferior splenic low-density capsular based lesion is felt to be similar and warrants followup attention. 2. No adenopathy or Tina peritoneal disease identified. 3.  Possible constipation. 4.  Advanced aortic and branch vessel atherosclerosis.    11/06/2015 Imaging    1. Mass arising from the medial spleen and involving the posterior wall of stomach demonstrates mild decrease in size in the interval. 2. Interval improvement and previously referenced pathologic iliac lymph nodes. 3. Previously referenced peritoneal implants within the pelvis are no longer measurable. No Tina areas of peritoneal metastasis is identified. 4. There is a right hilar node which is borderline enlarged but is increased in size when compared with 04/19/2013. Attention on follow-up examination is recommended.    11/18/2015 PET scan    1. Residual hypermetabolic tumor implant between the stomach and spleen, significantly decreased in size and mildly decreased in metabolism compared to 04/03/2015 PET-CT, and slightly decreased in size since  11/06/2015. 2.  Residual hypermetabolic right deep pelvic side wall lymph node, with partial treatment response compared to 04/03/2015 PET-CT. 3. No Tina sites of hypermetabolic metastatic disease. 4. Stable diffuse thyroid hypermetabolism without discrete thyroid nodule, most suggestive of diffuse thyroiditis. Recommend correlation with serum thyroid function tests.     01/28/2016 - 11/26/2016 Anti-estrogen oral therapy    She was placed on Letrozole, stopped due to poor tolerance    02/09/2016 Imaging    1. Continued decrease in size of peritoneal lesion between the posterior wall of stomach and spleen. 2. No Tina or progressive disease identified    06/23/2016 Imaging    1. Near complete resolution of subtle residual soft tissue density lesion in the gastrosplenic ligament. 2. No Tina sites of disease identified. 3. Stool burden suggests constipation or even fecal impaction    12/01/2016 Imaging    1. No CT findings for residual or recurrent abdominal/pelvic ovarian cancer. 2. No acute abdominal/pelvic findings. 3. Large amount of stool throughout the colon and down into the rectum suggesting constipation. 4. Stable atherosclerotic calcifications involving the aorta and iliac arteries.    03/29/2017 - 10/29/2017 Anti-estrogen oral therapy    She was on Aromasin    04/19/2017 Imaging    1. Suspicion of peritoneal recurrence with enlarging implants in the gastrosplenic ligament, left adnexa and posterior to the cecum. No generalized ascites, adenopathy or definite solid organ disease. 2. No evidence of bowel or ureteral obstruction. 3.  Aortic Atherosclerosis (ICD10-I70.0).    06/25/2017 Genetic Testing    Patient has genetic testing done for Breast/ovarian cancer panel Results revealed patient has no detectable mutation    09/01/2017 Tumor Marker    Patient's tumor was tested for the following markers: CA-125 Results of the tumor marker test revealed 40.9    10/29/2017 - 03/24/2018  Anti-estrogen oral therapy    She was on tamoxifen    11/05/2017 Tumor Marker    Patient's tumor was tested for the following markers: CA-125 Results of the tumor marker test revealed 84.4    11/15/2017 Imaging    1. Mild progression of peritoneal metastasis. 2. Possible constipation. No obstruction or other acute complication. 3.  Aortic Atherosclerosis (ICD10-I70.0).    03/21/2018 Tumor Marker    Patient's tumor was tested for the following markers: CA-125 Results of the tumor marker test revealed 101.3.    04/03/2018 Tumor Marker    Patient's tumor was tested for the following markers: CA-125 Results of the tumor marker test revealed 120.9    04/10/2018 - 07/17/2018 Chemotherapy    The patient had single agent carboplatin x 5 cycles    05/02/2018 Tumor Marker    Patient's tumor was tested for the following markers: CA-125 Results of the tumor marker test revealed 68.2    05/29/2018 Tumor Marker    Patient's tumor was tested for the following markers: CA-125 Results of the tumor marker test revealed 41.7     Genetic Testing    Patient has genetic testing done for HRD. Results revealed: negative for deleterious BRCA1 or BRCA2 mutations in tumor.     06/26/2018 Tumor Marker    Patient's tumor was tested for the following markers: CA-125 Results of the tumor marker test revealed 29.5    07/14/2018 Imaging    Interval decrease in size of bilateral adnexal masses.  No significant change in size of peritoneal metastasis in the left upper quadrant.  No Tina or progressive metastatic disease identified.    08/07/2018 - 11/07/2018 Chemotherapy  The patient had zejula    08/14/2018 Tumor Marker    Patient's tumor was tested for the following markers: CA-125 Results of the tumor marker test revealed 31.7    10/16/2018 Tumor Marker    Patient's tumor was tested for the following markers: CA-125 Results of the tumor marker test revealed 49    11/06/2018 Imaging    1. Mild to moderate  enlargement of the left upper quadrant lesion along the gastric wall and pancreatic tail suspicious for malignancy. Subtle invasion of the capsule of the spleen as before. 2. Stable soft tissue nodularity along the right adnexa. Slightly reduced soft tissue prominence along the left adnexa. By report the patient has had bilateral salpingo oophorectomy and accordingly these adnexal lesions are concerning for tumor. 3. No Tina areas of tumor nodularity are identified. 4. Other imaging findings of potential clinical significance: Pelvic floor laxity with small cystocele. Prominent stool throughout the colon favors constipation. Upper normal sized left periaortic lymph node. Bibasilar cylindrical bronchiectasis. Aortic Atherosclerosis (ICD10-I70.0). Coronary atherosclerosis.    11/06/2018 Tumor Marker    Patient's tumor was tested for the following markers: CA-125 Results of the tumor marker test revealed 61.5    11/25/2018 -  Chemotherapy    The patient had palonosetron (ALOXI) injection 0.25 mg, 0.25 mg, Intravenous,  Once, 2 of 4 cycles Administration: 0.25 mg (11/25/2018) CARBOplatin (PARAPLATIN) 280 mg in sodium chloride 0.9 % 250 mL chemo infusion, 280 mg (80 % of original dose 352 mg), Intravenous,  Once, 2 of 4 cycles Dose modification:   (original dose 352 mg, Cycle 1), 280 mg (original dose 352 mg, Cycle 2, Reason: Dose not tolerated) Administration: 280 mg (11/25/2018) PACLitaxel (TAXOL) 198 mg in sodium chloride 0.9 % 250 mL chemo infusion (> 39m/m2), 122.5 mg/m2 = 198 mg (70 % of original dose 175 mg/m2), Intravenous,  Once, 2 of 4 cycles Dose modification: 122.5 mg/m2 (70 % of original dose 175 mg/m2, Cycle 1, Reason: Dose Not Tolerated) Administration: 198 mg (11/25/2018)  for chemotherapy treatment.      REVIEW OF SYSTEMS:   Constitutional: Denies fevers, chills or abnormal weight loss Eyes: Denies blurriness of vision Ears, nose, mouth, throat, and face: Denies mucositis or sore  throat Respiratory: Denies cough, dyspnea or wheezes Cardiovascular: Denies palpitation, chest discomfort or lower extremity swelling Skin: Denies abnormal skin rashes Lymphatics: Denies Tina lymphadenopathy or easy bruising Behavioral/Psych: Mood is stable, no Tina changes  All other systems were reviewed with the patient and are negative.  I have reviewed the past medical history, past surgical history, social history and family history with the patient and they are unchanged from previous note.  ALLERGIES:  is allergic to clonidine derivatives; coreg [carvedilol]; caffeine; codeine; flexeril [cyclobenzaprine]; lipitor [atorvastatin]; lisinopril; pravastatin; tape; tegaderm ag mesh [silver]; and ultram [tramadol].  MEDICATIONS:  Current Outpatient Medications  Medication Sig Dispense Refill  . linaclotide (LINZESS) 145 MCG CAPS capsule Take 145 mcg by mouth daily before breakfast.    . acetaminophen (TYLENOL) 500 MG tablet Take 1,000 mg by mouth every 6 (six) hours as needed for mild pain.     .Marland Kitchenamiodarone (PACERONE) 200 MG tablet Take 100 mg by mouth daily.  3  . amiodarone (PACERONE) 200 MG tablet TAKE 1/2 TABLET BY MOUTH DAILY 45 tablet 3  . amoxicillin (AMOXIL) 500 MG capsule Take 2,000 mg by mouth See admin instructions. Takes 4 capsules 1 hour prior to procedure    . Biotin 10 MG TABS Take 20 mg by  mouth daily.    Marland Kitchen desonide (DESOWEN) 0.05 % cream Apply 1 application topically 2 (two) times daily.   1  . dexamethasone (DECADRON) 4 MG tablet Take 3 tabs at the night before and 3 tabs the morning of chemotherapy, every 3 weeks, by mouth 36 tablet 0  . diphenhydrAMINE (BENADRYL) 25 MG tablet Take 50 mg by mouth at bedtime as needed for sleep.     Marland Kitchen docusate sodium (COLACE) 100 MG capsule Take 250 mg by mouth 2 (two) times daily.     Marland Kitchen ELIQUIS 5 MG TABS tablet TAKE 1 TABLET BY MOUTH TWICE A DAY 60 tablet 4  . Evolocumab with Infusor 420 MG/3.5ML SOCT Inject into the skin.    Marland Kitchen ezetimibe  (ZETIA) 10 MG tablet Take 1 tablet (10 mg total) by mouth daily. (Patient taking differently: Take 10 mg by mouth at bedtime. ) 30 tablet 6  . Glucosamine HCl 1000 MG TABS Take 1,000 mg by mouth daily.    . hydrocortisone 1 % ointment Apply 1 application topically 2 (two) times daily as needed for itching (eczema).     Marland Kitchen ipratropium (ATROVENT) 0.06 % nasal spray Place 2 sprays into both nostrils daily.   5  . levothyroxine (SYNTHROID) 88 MCG tablet Take 88 mcg by mouth daily before breakfast.    . lidocaine-prilocaine (EMLA) cream Apply to Porta-Cath site 1-2 hours prior to access as directed. 30 g 1  . magnesium hydroxide (MILK OF MAGNESIA) 800 MG/5ML suspension Take 30 mLs by mouth daily as needed for constipation. Reported on 11/20/2015    . Melatonin 5 MG CAPS Take 5 mg by mouth at bedtime as needed (sleep). Reported on 12/25/2015    . Menthol, Topical Analgesic, (ICY HOT EX) Apply 1 application topically 2 (two) times daily as needed (PAIN).     . Multiple Vitamin (MULTIVITAMIN WITH MINERALS) TABS tablet Take 1 tablet by mouth daily. Centrum Silver    . ondansetron (ZOFRAN) 8 MG tablet TAKE 1 TABLET (8 MG TOTAL) BY MOUTH EVERY 8 (EIGHT) HOURS AS NEEDED FOR NAUSEA OR VOMITING. 90 tablet 1  . OVER THE COUNTER MEDICATION Co210 20 mg once a day for muscle aches.    Vladimir Faster Glycol-Propyl Glycol (SYSTANE OP) Place 1 drop into both eyes 2 (two) times daily.     . polyethylene glycol (MIRALAX / GLYCOLAX) packet Take 17 g by mouth daily. Mix in 8 oz liquid and drink    . potassium chloride (K-DUR) 10 MEQ tablet Take 10 mEq by mouth daily.    . prochlorperazine (COMPAZINE) 10 MG tablet Take 1 tablet (10 mg total) by mouth every 6 (six) hours as needed (Nausea or vomiting). 30 tablet 1  . SHINGRIX injection      No current facility-administered medications for this visit.    Facility-Administered Medications Ordered in Other Visits  Medication Dose Route Frequency Provider Last Rate Last Dose  .  CARBOplatin (PARAPLATIN) 280 mg in sodium chloride 0.9 % 100 mL chemo infusion  280 mg Intravenous Once Alvy Bimler, Chalsey Leeth, MD      . dexamethasone (DECADRON) 20 mg in sodium chloride 0.9 % 50 mL IVPB  20 mg Intravenous Once Alvy Bimler, Caelen Higinbotham, MD      . diphenhydrAMINE (BENADRYL) injection 50 mg  50 mg Intravenous Once Alvy Bimler, Rolonda Pontarelli, MD      . famotidine (PEPCID) IVPB 20 mg premix  20 mg Intravenous Once Charna Neeb, MD      . heparin lock flush 100 unit/mL  500 Units Intracatheter Once PRN Alvy Bimler, Mcguire Gasparyan, MD      . PACLitaxel (TAXOL) 198 mg in sodium chloride 0.9 % 250 mL chemo infusion (> 13m/m2)  122.5 mg/m2 (Treatment Plan Recorded) Intravenous Once GAlvy Bimler Mohmed Farver, MD      . palonosetron (ALOXI) injection 0.25 mg  0.25 mg Intravenous Once Anais Denslow, MD      . sodium chloride flush (NS) 0.9 % injection 10 mL  10 mL Intravenous PRN Cross, Melissa D, NP   10 mL at 01/06/18 1220  . sodium chloride flush (NS) 0.9 % injection 10 mL  10 mL Intracatheter PRN GAlvy Bimler Carriann Hesse, MD        PHYSICAL EXAMINATION: ECOG PERFORMANCE STATUS: 1 - Symptomatic but completely ambulatory  Vitals:   12/18/18 0829  BP: (!) 146/80  Pulse: 80  Resp: 18  Temp: 97.7 F (36.5 C)  SpO2: 100%   Filed Weights   12/18/18 0829  Weight: 133 lb 12.8 oz (60.7 kg)    GENERAL:alert, no distress and comfortable SKIN: skin color, texture, turgor are normal, no rashes or significant lesions EYES: normal, Conjunctiva are pink and non-injected, sclera clear OROPHARYNX:no exudate, no erythema and lips, buccal mucosa, and tongue normal  NECK: supple, thyroid normal size, non-tender, without nodularity LYMPH:  no palpable lymphadenopathy in the cervical, axillary or inguinal LUNGS: clear to auscultation and percussion with normal breathing effort HEART: regular rate & rhythm and no murmurs and no lower extremity edema ABDOMEN:abdomen soft, non-tender and normal bowel sounds Musculoskeletal:no cyanosis of digits and no clubbing  NEURO: alert &  oriented x 3 with fluent speech, no focal motor/sensory deficits  LABORATORY DATA:  I have reviewed the data as listed    Component Value Date/Time   NA 139 12/18/2018 0808   NA 141 11/11/2017 1145   K 3.8 12/18/2018 0808   K 4.0 11/11/2017 1145   CL 104 12/18/2018 0808   CL 103 05/21/2013 1105   CO2 25 12/18/2018 0808   CO2 29 11/11/2017 1145   GLUCOSE 210 (H) 12/18/2018 0808   GLUCOSE 81 11/11/2017 1145   GLUCOSE 74 05/21/2013 1105   BUN 16 12/18/2018 0808   BUN 10.9 11/11/2017 1145   CREATININE 0.81 12/18/2018 0808   CREATININE 0.7 11/11/2017 1145   CALCIUM 9.5 12/18/2018 0808   CALCIUM 9.6 11/11/2017 1145   PROT 6.6 12/18/2018 0808   PROT 6.5 11/24/2016 1104   ALBUMIN 4.1 12/18/2018 0808   ALBUMIN 3.9 11/24/2016 1104   AST 14 (L) 12/18/2018 0808   AST 18 11/24/2016 1104   ALT 12 12/18/2018 0808   ALT 15 11/24/2016 1104   ALKPHOS 91 12/18/2018 0808   ALKPHOS 104 11/24/2016 1104   BILITOT 0.5 12/18/2018 0808   BILITOT 0.57 11/24/2016 1104   GFRNONAA >60 12/18/2018 0808   GFRAA >60 12/18/2018 0808    No results found for: SPEP, UPEP  Lab Results  Component Value Date   WBC 4.7 12/18/2018   NEUTROABS 4.2 12/18/2018   HGB 10.4 (L) 12/18/2018   HCT 31.4 (L) 12/18/2018   MCV 107.2 (H) 12/18/2018   PLT 132 (L) 12/18/2018      Chemistry      Component Value Date/Time   NA 139 12/18/2018 0808   NA 141 11/11/2017 1145   K 3.8 12/18/2018 0808   K 4.0 11/11/2017 1145   CL 104 12/18/2018 0808   CL 103 05/21/2013 1105   CO2 25 12/18/2018 0808   CO2 29 11/11/2017 1145   BUN  16 12/18/2018 0808   BUN 10.9 11/11/2017 1145   CREATININE 0.81 12/18/2018 0808   CREATININE 0.7 11/11/2017 1145      Component Value Date/Time   CALCIUM 9.5 12/18/2018 0808   CALCIUM 9.6 11/11/2017 1145   ALKPHOS 91 12/18/2018 0808   ALKPHOS 104 11/24/2016 1104   AST 14 (L) 12/18/2018 0808   AST 18 11/24/2016 1104   ALT 12 12/18/2018 0808   ALT 15 11/24/2016 1104   BILITOT 0.5  12/18/2018 0808   BILITOT 0.57 11/24/2016 1104      All questions were answered. The patient knows to call the clinic with any problems, questions or concerns. No barriers to learning was detected.  I spent 15 minutes counseling the patient face to face. The total time spent in the appointment was 20 minutes and more than 50% was on counseling and review of test results  Heath Lark, MD 12/18/2018 9:17 AM

## 2018-12-18 NOTE — Assessment & Plan Note (Signed)
She does not think that her neuropathy is worse.  We will continue reduced dose treatment.

## 2018-12-19 LAB — CA 125: Cancer Antigen (CA) 125: 55.3 U/mL — ABNORMAL HIGH (ref 0.0–38.1)

## 2019-01-08 ENCOUNTER — Inpatient Hospital Stay: Payer: Medicare Other

## 2019-01-08 ENCOUNTER — Encounter: Payer: Self-pay | Admitting: Hematology and Oncology

## 2019-01-08 ENCOUNTER — Telehealth: Payer: Self-pay | Admitting: Hematology and Oncology

## 2019-01-08 ENCOUNTER — Inpatient Hospital Stay: Payer: Medicare Other | Attending: Hematology and Oncology

## 2019-01-08 ENCOUNTER — Inpatient Hospital Stay: Payer: Medicare Other | Admitting: Hematology and Oncology

## 2019-01-08 VITALS — BP 143/66 | HR 74 | Temp 97.6°F | Resp 18 | Ht 63.0 in | Wt 132.6 lb

## 2019-01-08 DIAGNOSIS — Z79899 Other long term (current) drug therapy: Secondary | ICD-10-CM

## 2019-01-08 DIAGNOSIS — K5909 Other constipation: Secondary | ICD-10-CM | POA: Insufficient documentation

## 2019-01-08 DIAGNOSIS — T451X5A Adverse effect of antineoplastic and immunosuppressive drugs, initial encounter: Secondary | ICD-10-CM

## 2019-01-08 DIAGNOSIS — D61818 Other pancytopenia: Secondary | ICD-10-CM | POA: Insufficient documentation

## 2019-01-08 DIAGNOSIS — G620 Drug-induced polyneuropathy: Secondary | ICD-10-CM

## 2019-01-08 DIAGNOSIS — C569 Malignant neoplasm of unspecified ovary: Secondary | ICD-10-CM

## 2019-01-08 DIAGNOSIS — Z5111 Encounter for antineoplastic chemotherapy: Secondary | ICD-10-CM | POA: Diagnosis not present

## 2019-01-08 DIAGNOSIS — R110 Nausea: Secondary | ICD-10-CM

## 2019-01-08 DIAGNOSIS — Z7189 Other specified counseling: Secondary | ICD-10-CM

## 2019-01-08 LAB — CMP (CANCER CENTER ONLY)
ALBUMIN: 4 g/dL (ref 3.5–5.0)
ALT: 11 U/L (ref 0–44)
AST: 14 U/L — ABNORMAL LOW (ref 15–41)
Alkaline Phosphatase: 87 U/L (ref 38–126)
Anion gap: 9 (ref 5–15)
BUN: 13 mg/dL (ref 8–23)
CALCIUM: 9.3 mg/dL (ref 8.9–10.3)
CO2: 27 mmol/L (ref 22–32)
Chloride: 104 mmol/L (ref 98–111)
Creatinine: 0.83 mg/dL (ref 0.44–1.00)
GFR, Est AFR Am: >60 mL/min (ref >60)
GFR, Estimated: >60 mL/min (ref >60)
GLUCOSE: 147 mg/dL — AB (ref 70–99)
Potassium: 3.8 mmol/L (ref 3.5–5.1)
Sodium: 140 mmol/L (ref 135–145)
Total Bilirubin: 0.7 mg/dL (ref 0.3–1.2)
Total Protein: 6.4 g/dL — ABNORMAL LOW (ref 6.5–8.1)

## 2019-01-08 LAB — CBC WITH DIFFERENTIAL (CANCER CENTER ONLY)
Abs Immature Granulocytes: 0.02 10*3/uL (ref 0.00–0.07)
Basophils Absolute: 0 10*3/uL (ref 0.0–0.1)
Basophils Relative: 0 %
Eosinophils Absolute: 0 10*3/uL (ref 0.0–0.5)
Eosinophils Relative: 0 %
HCT: 31.7 % — ABNORMAL LOW (ref 36.0–46.0)
Hemoglobin: 10.1 g/dL — ABNORMAL LOW (ref 12.0–15.0)
Immature Granulocytes: 1 %
Lymphocytes Relative: 14 %
Lymphs Abs: 0.5 10*3/uL — ABNORMAL LOW (ref 0.7–4.0)
MCH: 33.8 pg (ref 26.0–34.0)
MCHC: 31.9 g/dL (ref 30.0–36.0)
MCV: 106 fL — ABNORMAL HIGH (ref 80.0–100.0)
Monocytes Absolute: 0.3 10*3/uL (ref 0.1–1.0)
Monocytes Relative: 9 %
NEUTROS ABS: 2.7 10*3/uL (ref 1.7–7.7)
NEUTROS PCT: 76 %
Platelet Count: 134 10*3/uL — ABNORMAL LOW (ref 150–400)
RBC: 2.99 MIL/uL — ABNORMAL LOW (ref 3.87–5.11)
RDW: 14.5 % (ref 11.5–15.5)
WBC Count: 3.5 10*3/uL — ABNORMAL LOW (ref 4.0–10.5)
nRBC: 0 % (ref 0.0–0.2)

## 2019-01-08 MED ORDER — DIPHENHYDRAMINE HCL 50 MG/ML IJ SOLN
INTRAMUSCULAR | Status: AC
  Filled 2019-01-08: qty 1

## 2019-01-08 MED ORDER — HEPARIN SOD (PORK) LOCK FLUSH 100 UNIT/ML IV SOLN
500.0000 [IU] | Freq: Once | INTRAVENOUS | Status: AC | PRN
  Administered 2019-01-08: 500 [IU]
  Filled 2019-01-08: qty 5

## 2019-01-08 MED ORDER — SODIUM CHLORIDE 0.9 % IV SOLN
20.0000 mg | Freq: Once | INTRAVENOUS | Status: AC
  Administered 2019-01-08: 20 mg via INTRAVENOUS
  Filled 2019-01-08: qty 2

## 2019-01-08 MED ORDER — SODIUM CHLORIDE 0.9% FLUSH
10.0000 mL | Freq: Once | INTRAVENOUS | Status: AC
  Administered 2019-01-08: 10 mL
  Filled 2019-01-08: qty 10

## 2019-01-08 MED ORDER — FAMOTIDINE IN NACL 20-0.9 MG/50ML-% IV SOLN
20.0000 mg | Freq: Once | INTRAVENOUS | Status: AC
  Administered 2019-01-08: 20 mg via INTRAVENOUS

## 2019-01-08 MED ORDER — DIPHENHYDRAMINE HCL 50 MG/ML IJ SOLN
50.0000 mg | Freq: Once | INTRAMUSCULAR | Status: AC
  Administered 2019-01-08: 50 mg via INTRAVENOUS

## 2019-01-08 MED ORDER — PALONOSETRON HCL INJECTION 0.25 MG/5ML
0.2500 mg | Freq: Once | INTRAVENOUS | Status: AC
  Administered 2019-01-08: 0.25 mg via INTRAVENOUS

## 2019-01-08 MED ORDER — FAMOTIDINE IN NACL 20-0.9 MG/50ML-% IV SOLN
INTRAVENOUS | Status: AC
  Filled 2019-01-08: qty 50

## 2019-01-08 MED ORDER — SODIUM CHLORIDE 0.9% FLUSH
10.0000 mL | INTRAVENOUS | Status: DC | PRN
  Administered 2019-01-08: 10 mL
  Filled 2019-01-08: qty 10

## 2019-01-08 MED ORDER — PALONOSETRON HCL INJECTION 0.25 MG/5ML
INTRAVENOUS | Status: AC
  Filled 2019-01-08: qty 5

## 2019-01-08 MED ORDER — SODIUM CHLORIDE 0.9 % IV SOLN
122.5000 mg/m2 | Freq: Once | INTRAVENOUS | Status: AC
  Administered 2019-01-08: 198 mg via INTRAVENOUS
  Filled 2019-01-08: qty 33

## 2019-01-08 MED ORDER — SODIUM CHLORIDE 0.9 % IV SOLN
Freq: Once | INTRAVENOUS | Status: AC
  Administered 2019-01-08: 11:00:00 via INTRAVENOUS
  Filled 2019-01-08: qty 250

## 2019-01-08 MED ORDER — SODIUM CHLORIDE 0.9 % IV SOLN
280.0000 mg | Freq: Once | INTRAVENOUS | Status: AC
  Administered 2019-01-08: 280 mg via INTRAVENOUS
  Filled 2019-01-08: qty 28

## 2019-01-08 NOTE — Assessment & Plan Note (Signed)
She has chronic nausea requiring regular doses of Zofran She will continue the same.

## 2019-01-08 NOTE — Assessment & Plan Note (Signed)
So far, she tolerated chemotherapy well with expected side effects such as mild pancytopenia, constipation and neuropathy We will proceed with cycle 3 with similar dose adjustment as before I plan to repeat CT imaging before her next cycle of therapy

## 2019-01-08 NOTE — Assessment & Plan Note (Signed)
She does not think that her neuropathy is worse.  We will continue reduced dose treatment.

## 2019-01-08 NOTE — Assessment & Plan Note (Signed)
She has chronic pancytopenia, stable with recent chemotherapy We will proceed with similar dose adjustment as before

## 2019-01-08 NOTE — Progress Notes (Signed)
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  We will continue reduced dose treatment.  Other constipation She has chronic constipation, resolved with aggressive laxative therapy and Linzess.  She will continue the same.  Chemotherapy-induced nausea She has chronic nausea requiring regular doses of Zofran She will continue the same.   Orders Placed This Encounter  Procedures  . CT ABDOMEN PELVIS W CONTRAST    Standing Status:   Future    Standing Expiration Date:   01/09/2020    Order Specific Question:   If indicated for the ordered procedure, I authorize the administration of contrast media per Radiology protocol    Answer:   Yes    Order Specific Question:   Preferred imaging location?    Answer:   [facility]    Order Specific Question:   Radiology Contrast Protocol - do NOT remove file path    Answer:   [email or web address]    INTERVAL HISTORY: Please see below for problem oriented charting. She is seen prior to cycle 3 of chemotherapy She continues to have chronic nausea requiring daily Zofran.  She has some mild constipation requiring daily laxative and Linzess She has neuropathy affecting the tips of fingers.  She denies worsening arthritis The neuropathy is not worse No recent infection, fever or chills Her  appetite is stable.  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  Surgical consultation is advised.  No findings to suggest metastatic disease in the abdomen/pelvis    02/13/2013 Pathology Results    1. Adnexa - ovary +/- tube, neoplastic - HIGH GRADE OVARIAN POORLY DIFFERENTIATED CARCINOMA WITH ASSOCIATED TUMOR NECROSIS, 11.5 CM, INVOLVING THE OVARIAN CAPSULE AND ADJACENT FALLOPIAN TUBE. - PLEASE SEE ONCOLOGY TEMPLATE FOR DETAILS. 2. Ovary and fallopian tube, left - HIGH GRADE OVARIAN POORLY DIFFERENTIATED CARCINOMA WITH ASSOCIATED TUMOR NECROSIS, 2.3 CM, INVOLVING THE OVARIAN CAPSULE. - FALLOPIAN TUBAL TISSUE, NO EVIDENCE OF MALIGNANCY. - PLEASE SEE ONCOLOGY TEMPLATE FOR DETAILS. 3. Uterus and cervix - ENDOMETRIUM: BENIGN WEAKLY PROLIFERATIVE ENDOMETRIUM, NO ATYPIA, HYPERPLASIA OR MALIGNANCY. - CERVIX: BENIGN SQUAMOUS MUCOSA AND ENDOCERVICAL MUCOSA, NO DYSPLASIA OR MALIGNANCY. - UTERINE SEROSA: ADHESIONS, NO EVIDENCE OF ENDOMETRIOSIS, ATYPIA OR MALIGNANCY. 4. Cul-de-sac biopsy - POSITIVE FOR METASTATIC CARCINOMA (2.0 CM). 5. Soft tissue mass, simple excision, peri rectal - POSITIVE  FOR METASTATIC CARCINOMA (2.6 CM). 6. Lymph nodes, regional resection, right pelvic - SEVEN LYMPH NODES, NEGATIVE FOR METASTATIC CARCINOMA (0/7). 7. Omentum, resection for tumor -  MATURE ADIPOSE TISSUE, NO EVIDENCE OF MALIGNANCY. Microscopic Comment 1. OVARY Specimen(s): Right and left ovaries and fallopian tubes, uterus and cervix Procedure(s): Total hysterectomy, bilateral salpingo-oophorectomy Lymph node sampling performed: Yes Primary tumor site: Right and left ovaries Ovarian surface involvement: Present Maximum tumor size (cm): Right ovary: 11.5 cm; left ovary: 2.3 cm, gross measurement. 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 please see comment. Grade: High grade Peritoneal implants: No Pelvic extension: Cul-de-sac biopsy and perirectal soft tissue biopsy: positive Peritoneal washings: Rare cluster of atypical cells present ([id number]) Lymph nodes: number examined 7 ; number positive 0 TNM code: pT2c, pN0 FIGO Stage (based on pathologic findings, needs clinical correlation): II B (2014 edition) Comments: Sections of bilateral ovaries show sheets and nests of malignant epithelium resembling transitional cell carcinoma with associated tumor necrosis. No evidence of Brenner's tumor or conventional endometrioid carcinoma is identified in the background. The bilateral ovarian capsules are involved. Immunohistochemical stains were performed and the malignant cells are strongly positive for cytokeratin 7, WT-1, p16 and p53, negative for cytokeratin 20. 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 The tumor involves the right fallopian tube. Both cul-de-sac and perirectal soft tissue biopsies are positive for metastatic carcinoma. The omentum biopsy is negative for tumor.     03/20/2013 - 08/14/2013 Chemotherapy    The patient had 6 cycles of carboplatin and taxol    09/25/2013 Imaging    1. Interval resection of previously described right pelvic mass. No evidence of recurrent or metastatic disease. 2.  Small hiatal hernia. 3. Moderate amount of stool within the rectum. Question constipation or even mild fecal impaction.    04/03/2015 PET scan    1. Highly hypermetabolic height bowed dense mass in the medial spleen compatible with malignancy. 2. Bilateral pelvic sidewall adenopathy, right greater than left, hypermetabolic, with small peritoneal implants of tumor in the lower pelvis, and with several faint but suspected omental implants of tumor. There is also a hypermetabolic aortocaval lymph node in the retroperitoneum. 3. Generalized increased thyroid activity could be due to thyroiditis. Although a well-defined nodule is not seen in the right thyroid lobe, there is focal increased hypermetabolic activity in the right thyroid lobe. Thyroid ultrasound is recommended for further investigation. 4. I suspect that the high activity between the spinous processes of L3 and L4 is degenerative.    05/01/2015 - 10/21/2015 Chemotherapy    The patient had 8 cycles of carboplatin, taxol and Avastin.      07/02/2015 Imaging    1. Decrease in size of these splenic lesion. Suspect tumor also involving the adjacent posterior wall of the stomach. 2. Improved pelvic lymphadenopathy. 3. No new omental or peritoneal surface disease.     09/10/2015 Imaging    1. Response to therapy, with further decreased size of a medial splenic lesion. The previously questioned extension into the stomach is no longer identified and may have been artifactual on the prior exam. Of note, an inferior splenic low-density capsular based lesion is felt to be similar and warrants followup attention. 2. No adenopathy or new peritoneal disease identified. 3.  Possible constipation. 4.  Advanced aortic and branch vessel atherosclerosis.    11/06/2015 Imaging    1. Mass arising from the medial  spleen and involving the posterior wall of stomach demonstrates mild decrease in size in the interval. 2. Interval improvement and previously  referenced pathologic iliac lymph nodes. 3. Previously referenced peritoneal implants within the pelvis are no longer measurable. No new areas of peritoneal metastasis is identified. 4. There is a right hilar node which is borderline enlarged but is increased in size when compared with 04/19/2013. Attention on follow-up examination is recommended.    11/18/2015 PET scan    1. Residual hypermetabolic tumor implant between the stomach and spleen, significantly decreased in size and mildly decreased in metabolism compared to 04/03/2015 PET-CT, and slightly decreased in size since 11/06/2015. 2. Residual hypermetabolic right deep pelvic side wall lymph node, with partial treatment response compared to 04/03/2015 PET-CT. 3. No new sites of hypermetabolic metastatic disease. 4. Stable diffuse thyroid hypermetabolism without discrete thyroid nodule, most suggestive of diffuse thyroiditis. Recommend correlation with serum thyroid function tests.     01/28/2016 - 11/26/2016 Anti-estrogen oral therapy    She was placed on Letrozole, stopped due to poor tolerance    02/09/2016 Imaging    1. Continued decrease in size of peritoneal lesion between the posterior wall of stomach and spleen. 2. No new or progressive disease identified    06/23/2016 Imaging    1. Near complete resolution of subtle residual soft tissue density lesion in the gastrosplenic ligament. 2. No new sites of disease identified. 3. Stool burden suggests constipation or even fecal impaction    12/01/2016 Imaging    1. No CT findings for residual or recurrent abdominal/pelvic ovarian cancer. 2. No acute abdominal/pelvic findings. 3. Large amount of stool throughout the colon and down into the rectum suggesting constipation. 4. Stable atherosclerotic calcifications involving the aorta and iliac arteries.    03/29/2017 - 10/29/2017 Anti-estrogen oral therapy    She was on Aromasin    04/19/2017 Imaging    1. Suspicion of peritoneal  recurrence with enlarging implants in the gastrosplenic ligament, left adnexa and posterior to the cecum. No generalized ascites, adenopathy or definite solid organ disease. 2. No evidence of bowel or ureteral obstruction. 3.  Aortic Atherosclerosis (ICD10-I70.0).    06/25/2017 Genetic Testing    Patient has genetic testing done for Breast/ovarian cancer panel Results revealed patient has no detectable mutation    09/01/2017 Tumor Marker    Patient's tumor was tested for the following markers: CA-125 Results of the tumor marker test revealed 40.9    10/29/2017 - 03/24/2018 Anti-estrogen oral therapy    She was on tamoxifen    11/05/2017 Tumor Marker    Patient's tumor was tested for the following markers: CA-125 Results of the tumor marker test revealed 84.4    11/15/2017 Imaging    1. Mild progression of peritoneal metastasis. 2. Possible constipation. No obstruction or other acute complication. 3.  Aortic Atherosclerosis (ICD10-I70.0).    03/21/2018 Tumor Marker    Patient's tumor was tested for the following markers: CA-125 Results of the tumor marker test revealed 101.3.    04/03/2018 Tumor Marker    Patient's tumor was tested for the following markers: CA-125 Results of the tumor marker test revealed 120.9    04/10/2018 - 07/17/2018 Chemotherapy    The patient had single agent carboplatin x 5 cycles    05/02/2018 Tumor Marker    Patient's tumor was tested for the following markers: CA-125 Results of the tumor marker test revealed 68.2    05/29/2018 Tumor Marker    Patient's tumor was tested for the  following markers: CA-125 Results of the tumor marker test revealed 41.7     Genetic Testing    Patient has genetic testing done for HRD. Results revealed: negative for deleterious BRCA1 or BRCA2 mutations in tumor.     06/26/2018 Tumor Marker    Patient's tumor was tested for the following markers: CA-125 Results of the tumor marker test revealed 29.5    07/14/2018 Imaging     Interval decrease in size of bilateral adnexal masses.  No significant change in size of peritoneal metastasis in the left upper quadrant.  No new or progressive metastatic disease identified.    08/07/2018 - 11/07/2018 Chemotherapy    The patient had zejula    08/14/2018 Tumor Marker    Patient's tumor was tested for the following markers: CA-125 Results of the tumor marker test revealed 31.7    10/16/2018 Tumor Marker    Patient's tumor was tested for the following markers: CA-125 Results of the tumor marker test revealed 49    11/06/2018 Imaging    1. Mild to moderate enlargement of the left upper quadrant lesion along the gastric wall and pancreatic tail suspicious for malignancy. Subtle invasion of the capsule of the spleen as before. 2. Stable soft tissue nodularity along the right adnexa. Slightly reduced soft tissue prominence along the left adnexa. By report the patient has had bilateral salpingo oophorectomy and accordingly these adnexal lesions are concerning for tumor. 3. No new areas of tumor nodularity are identified. 4. Other imaging findings of potential clinical significance: Pelvic floor laxity with small cystocele. Prominent stool throughout the colon favors constipation. Upper normal sized left periaortic lymph node. Bibasilar cylindrical bronchiectasis. Aortic Atherosclerosis (ICD10-I70.0). Coronary atherosclerosis.    11/06/2018 Tumor Marker    Patient's tumor was tested for the following markers: CA-125 Results of the tumor marker test revealed 61.5    11/25/2018 -  Chemotherapy    The patient had carboplatin and taxol    12/18/2018 Tumor Marker    Patient's tumor was tested for the following markers: CA-125 Results of the tumor marker test revealed 55.3     REVIEW OF SYSTEMS:   Constitutional: Denies fevers, chills or abnormal weight loss Eyes: Denies blurriness of vision Ears, nose, mouth, throat, and face: Denies mucositis or sore throat Respiratory: Denies  cough, dyspnea or wheezes Cardiovascular: Denies palpitation, chest discomfort or lower extremity swelling Skin: Denies abnormal skin rashes Lymphatics: Denies new lymphadenopathy or easy bruising Neurological:Denies numbness, tingling or new weaknesses Behavioral/Psych: Mood is stable, no new changes  All other systems were reviewed with the patient and are negative.  I have reviewed the past medical history, past surgical history, social history and family history with the patient and they are unchanged from previous note.  ALLERGIES:  is allergic to clonidine derivatives; coreg [carvedilol]; caffeine; codeine; flexeril [cyclobenzaprine]; lipitor [atorvastatin]; lisinopril; pravastatin; tape; tegaderm ag mesh [silver]; and ultram [tramadol].  MEDICATIONS:  Current Outpatient Medications  Medication Sig Dispense Refill  . alendronate (FOSAMAX) 70 MG tablet Take 70 mg by mouth once a week. Take with a full glass of water on an empty stomach.    Marland Kitchen acetaminophen (TYLENOL) 500 MG tablet Take 1,000 mg by mouth every 6 (six) hours as needed for mild pain.     Marland Kitchen amiodarone (PACERONE) 200 MG tablet Take 100 mg by mouth daily.  3  . amiodarone (PACERONE) 200 MG tablet TAKE 1/2 TABLET BY MOUTH DAILY 45 tablet 3  . amoxicillin (AMOXIL) 500 MG capsule Take 2,000 mg  by mouth See admin instructions. Takes 4 capsules 1 hour prior to procedure    . Biotin 10 MG TABS Take 20 mg by mouth daily.    Marland Kitchen desonide (DESOWEN) 0.05 % cream Apply 1 application topically 2 (two) times daily.   1  . dexamethasone (DECADRON) 4 MG tablet Take 3 tabs at the night before and 3 tabs the morning of chemotherapy, every 3 weeks, by mouth 36 tablet 0  . diphenhydrAMINE (BENADRYL) 25 MG tablet Take 50 mg by mouth at bedtime as needed for sleep.     Marland Kitchen docusate sodium (COLACE) 100 MG capsule Take 250 mg by mouth 2 (two) times daily.     Marland Kitchen ELIQUIS 5 MG TABS tablet TAKE 1 TABLET BY MOUTH TWICE A DAY 60 tablet 4  . Evolocumab with  Infusor 420 MG/3.5ML SOCT Inject into the skin.    Marland Kitchen ezetimibe (ZETIA) 10 MG tablet Take 1 tablet (10 mg total) by mouth daily. (Patient taking differently: Take 10 mg by mouth at bedtime. ) 30 tablet 6  . Glucosamine HCl 1000 MG TABS Take 1,000 mg by mouth daily.    . hydrocortisone 1 % ointment Apply 1 application topically 2 (two) times daily as needed for itching (eczema).     Marland Kitchen ipratropium (ATROVENT) 0.06 % nasal spray Place 2 sprays into both nostrils daily.   5  . levothyroxine (SYNTHROID) 88 MCG tablet Take 88 mcg by mouth daily before breakfast.    . lidocaine-prilocaine (EMLA) cream Apply to Porta-Cath site 1-2 hours prior to access as directed. 30 g 1  . linaclotide (LINZESS) 145 MCG CAPS capsule Take 145 mcg by mouth daily before breakfast.    . magnesium hydroxide (MILK OF MAGNESIA) 800 MG/5ML suspension Take 30 mLs by mouth daily as needed for constipation. Reported on 11/20/2015    . Melatonin 5 MG CAPS Take 5 mg by mouth at bedtime as needed (sleep). Reported on 12/25/2015    . Menthol, Topical Analgesic, (ICY HOT EX) Apply 1 application topically 2 (two) times daily as needed (PAIN).     . Multiple Vitamin (MULTIVITAMIN WITH MINERALS) TABS tablet Take 1 tablet by mouth daily. Centrum Silver    . ondansetron (ZOFRAN) 8 MG tablet TAKE 1 TABLET (8 MG TOTAL) BY MOUTH EVERY 8 (EIGHT) HOURS AS NEEDED FOR NAUSEA OR VOMITING. 90 tablet 1  . OVER THE COUNTER MEDICATION Co210 20 mg once a day for muscle aches.    Vladimir Faster Glycol-Propyl Glycol (SYSTANE OP) Place 1 drop into both eyes 2 (two) times daily.     . polyethylene glycol (MIRALAX / GLYCOLAX) packet Take 17 g by mouth daily. Mix in 8 oz liquid and drink    . potassium chloride (K-DUR) 10 MEQ tablet Take 10 mEq by mouth daily.    . prochlorperazine (COMPAZINE) 10 MG tablet Take 1 tablet (10 mg total) by mouth every 6 (six) hours as needed (Nausea or vomiting). 30 tablet 1  . SHINGRIX injection      No current facility-administered  medications for this visit.    Facility-Administered Medications Ordered in Other Visits  Medication Dose Route Frequency Provider Last Rate Last Dose  . CARBOplatin (PARAPLATIN) 280 mg in sodium chloride 0.9 % 250 mL chemo infusion  280 mg Intravenous Once Tumeka Chimenti, MD      . heparin lock flush 100 unit/mL  500 Units Intracatheter Once PRN Alvy Bimler, Eshaan Titzer, MD      . PACLitaxel (TAXOL) 198 mg in sodium chloride 0.9 %  250 mL chemo infusion (> 65m/m2)  122.5 mg/m2 (Treatment Plan Recorded) Intravenous Once GHeath Lark MD 94 mL/hr at 01/08/19 1219 198 mg at 01/08/19 1219  . sodium chloride flush (NS) 0.9 % injection 10 mL  10 mL Intravenous PRN Cross, Melissa D, NP   10 mL at 01/06/18 1220  . sodium chloride flush (NS) 0.9 % injection 10 mL  10 mL Intracatheter PRN GAlvy Bimler Aerik Polan, MD        PHYSICAL EXAMINATION: ECOG PERFORMANCE STATUS: 2 - Symptomatic, <50% confined to bed  Vitals:   01/08/19 1016  BP: (!) 143/66  Pulse: 74  Resp: 18  Temp: 97.6 F (36.4 C)  SpO2: 100%   Filed Weights   01/08/19 1016  Weight: 132 lb 9.6 oz (60.1 kg)    GENERAL:alert, no distress and comfortable SKIN: skin color, texture, turgor are normal, no rashes or significant lesions EYES: normal, Conjunctiva are pink and non-injected, sclera clear OROPHARYNX:no exudate, no erythema and lips, buccal mucosa, and tongue normal  NECK: supple, thyroid normal size, non-tender, without nodularity LYMPH:  no palpable lymphadenopathy in the cervical, axillary or inguinal LUNGS: clear to auscultation and percussion with normal breathing effort HEART: regular rate & rhythm and no murmurs and no lower extremity edema ABDOMEN:abdomen soft, non-tender and normal bowel sounds.  Her abdomen appears somewhat bloated Musculoskeletal:no cyanosis of digits and no clubbing.  Noted osteoarthritis changes NEURO: alert & oriented x 3 with fluent speech, no focal motor/sensory deficits  LABORATORY DATA:  I have reviewed the data as  listed    Component Value Date/Time   NA 140 01/08/2019 0931   NA 141 11/11/2017 1145   K 3.8 01/08/2019 0931   K 4.0 11/11/2017 1145   CL 104 01/08/2019 0931   CL 103 05/21/2013 1105   CO2 27 01/08/2019 0931   CO2 29 11/11/2017 1145   GLUCOSE 147 (H) 01/08/2019 0931   GLUCOSE 81 11/11/2017 1145   GLUCOSE 74 05/21/2013 1105   BUN 13 01/08/2019 0931   BUN 10.9 11/11/2017 1145   CREATININE 0.83 01/08/2019 0931   CREATININE 0.7 11/11/2017 1145   CALCIUM 9.3 01/08/2019 0931   CALCIUM 9.6 11/11/2017 1145   PROT 6.4 (L) 01/08/2019 0931   PROT 6.5 11/24/2016 1104   ALBUMIN 4.0 01/08/2019 0931   ALBUMIN 3.9 11/24/2016 1104   AST 14 (L) 01/08/2019 0931   AST 18 11/24/2016 1104   ALT 11 01/08/2019 0931   ALT 15 11/24/2016 1104   ALKPHOS 87 01/08/2019 0931   ALKPHOS 104 11/24/2016 1104   BILITOT 0.7 01/08/2019 0931   BILITOT 0.57 11/24/2016 1104   GFRNONAA >60 01/08/2019 0931   GFRAA >60 01/08/2019 0931    No results found for: SPEP, UPEP  Lab Results  Component Value Date   WBC 3.5 (L) 01/08/2019   NEUTROABS 2.7 01/08/2019   HGB 10.1 (L) 01/08/2019   HCT 31.7 (L) 01/08/2019   MCV 106.0 (H) 01/08/2019   PLT 134 (L) 01/08/2019      Chemistry      Component Value Date/Time   NA 140 01/08/2019 0931   NA 141 11/11/2017 1145   K 3.8 01/08/2019 0931   K 4.0 11/11/2017 1145   CL 104 01/08/2019 0931   CL 103 05/21/2013 1105   CO2 27 01/08/2019 0931   CO2 29 11/11/2017 1145   BUN 13 01/08/2019 0931   BUN 10.9 11/11/2017 1145   CREATININE 0.83 01/08/2019 0931   CREATININE 0.7 11/11/2017 1145  Component Value Date/Time   CALCIUM 9.3 01/08/2019 0931   CALCIUM 9.6 11/11/2017 1145   ALKPHOS 87 01/08/2019 0931   ALKPHOS 104 11/24/2016 1104   AST 14 (L) 01/08/2019 0931   AST 18 11/24/2016 1104   ALT 11 01/08/2019 0931   ALT 15 11/24/2016 1104   BILITOT 0.7 01/08/2019 0931   BILITOT 0.57 11/24/2016 1104       All questions were answered. The patient knows to  call the clinic with any problems, questions or concerns. No barriers to learning was detected.  I spent 25 minutes counseling the patient face to face. The total time spent in the appointment was 30 minutes and more than 50% was on counseling and review of test results  Heath Lark, MD 01/08/2019 1:09 PM

## 2019-01-08 NOTE — Assessment & Plan Note (Signed)
She has chronic constipation, resolved with aggressive laxative therapy and Linzess.  She will continue the same.

## 2019-01-08 NOTE — Telephone Encounter (Signed)
Gave avs and calendar ° °

## 2019-01-08 NOTE — Patient Instructions (Signed)
[facility] Discharge Instructions for Patients Receiving Chemotherapy  Today you received the following chemotherapy agents Paclitaxel (TAXOL) & Carboplatin (PARAPLATIN).  To help prevent nausea and vomiting after your treatment, we encourage you to take your nausea medication as prescribed.  If you develop nausea and vomiting that is not controlled by your nausea medication, call the clinic.   BELOW ARE SYMPTOMS THAT SHOULD BE REPORTED IMMEDIATELY:  *FEVER GREATER THAN 100.5 F  *CHILLS WITH OR WITHOUT FEVER  NAUSEA AND VOMITING THAT IS NOT CONTROLLED WITH YOUR NAUSEA MEDICATION  *UNUSUAL SHORTNESS OF BREATH  *UNUSUAL BRUISING OR BLEEDING  TENDERNESS IN MOUTH AND THROAT WITH OR WITHOUT PRESENCE OF ULCERS  *URINARY PROBLEMS  *BOWEL PROBLEMS  UNUSUAL RASH Items with * indicate a potential emergency and should be followed up as soon as possible.  Feel free to call the clinic should you have any questions or concerns. The clinic phone number is [phone].  Please show the CHEMO ALERT CARD at check-in to the Emergency Department and triage nurse.   

## 2019-01-26 ENCOUNTER — Encounter (HOSPITAL_COMMUNITY): Payer: Self-pay

## 2019-01-26 ENCOUNTER — Ambulatory Visit (HOSPITAL_COMMUNITY)
Admission: RE | Admit: 2019-01-26 | Discharge: 2019-01-26 | Disposition: A | Discharge status: Home / Self Care | Payer: Medicare Other | Source: Ambulatory Visit | Attending: Hematology and Oncology | Admitting: Hematology and Oncology

## 2019-01-26 ENCOUNTER — Inpatient Hospital Stay: Payer: Medicare Other

## 2019-01-26 DIAGNOSIS — K639 Disease of intestine, unspecified: Secondary | ICD-10-CM | POA: Diagnosis not present

## 2019-01-26 DIAGNOSIS — D7389 Other diseases of spleen: Secondary | ICD-10-CM | POA: Diagnosis not present

## 2019-01-26 DIAGNOSIS — Z5111 Encounter for antineoplastic chemotherapy: Secondary | ICD-10-CM | POA: Diagnosis not present

## 2019-01-26 DIAGNOSIS — C569 Malignant neoplasm of unspecified ovary: Secondary | ICD-10-CM

## 2019-01-26 DIAGNOSIS — Z7189 Other specified counseling: Secondary | ICD-10-CM

## 2019-01-26 LAB — CBC WITH DIFFERENTIAL (CANCER CENTER ONLY)
Abs Immature Granulocytes: 0.02 10*3/uL (ref 0.00–0.07)
Basophils Absolute: 0 10*3/uL (ref 0.0–0.1)
Basophils Relative: 1 %
Eosinophils Absolute: 0 10*3/uL (ref 0.0–0.5)
Eosinophils Relative: 1 %
HCT: 29.4 % — ABNORMAL LOW (ref 36.0–46.0)
Hemoglobin: 9.5 g/dL — ABNORMAL LOW (ref 12.0–15.0)
IMMATURE GRANULOCYTES: 1 %
Lymphocytes Relative: 52 %
Lymphs Abs: 1.1 10*3/uL (ref 0.7–4.0)
MCH: 34.4 pg — ABNORMAL HIGH (ref 26.0–34.0)
MCHC: 32.3 g/dL (ref 30.0–36.0)
MCV: 106.5 fL — ABNORMAL HIGH (ref 80.0–100.0)
MONOS PCT: 18 %
Monocytes Absolute: 0.4 10*3/uL (ref 0.1–1.0)
NEUTROS PCT: 27 %
Neutro Abs: 0.6 10*3/uL — ABNORMAL LOW (ref 1.7–7.7)
Platelet Count: 131 10*3/uL — ABNORMAL LOW (ref 150–400)
RBC: 2.76 MIL/uL — ABNORMAL LOW (ref 3.87–5.11)
RDW: 15.5 % (ref 11.5–15.5)
WBC Count: 2.1 10*3/uL — ABNORMAL LOW (ref 4.0–10.5)
nRBC: 0 % (ref 0.0–0.2)

## 2019-01-26 LAB — CMP (CANCER CENTER ONLY)
ALT: 8 U/L (ref 0–44)
AST: 18 U/L (ref 15–41)
Albumin: 3.8 g/dL (ref 3.5–5.0)
Alkaline Phosphatase: 88 U/L (ref 38–126)
Anion gap: 8 (ref 5–15)
BUN: 9 mg/dL (ref 8–23)
CO2: 29 mmol/L (ref 22–32)
Calcium: 8.6 mg/dL — ABNORMAL LOW (ref 8.9–10.3)
Chloride: 105 mmol/L (ref 98–111)
Creatinine: 0.71 mg/dL (ref 0.44–1.00)
GFR, Est AFR Am: >60 mL/min (ref >60)
GFR, Estimated: >60 mL/min (ref >60)
Glucose, Bld: 84 mg/dL (ref 70–99)
Potassium: 3.6 mmol/L (ref 3.5–5.1)
Sodium: 142 mmol/L (ref 135–145)
Total Bilirubin: 0.5 mg/dL (ref 0.3–1.2)
Total Protein: 6.1 g/dL — ABNORMAL LOW (ref 6.5–8.1)

## 2019-01-26 MED ORDER — IOHEXOL 300 MG/ML  SOLN
100.0000 mL | Freq: Once | INTRAMUSCULAR | Status: AC | PRN
  Administered 2019-01-26: 100 mL via INTRAVENOUS

## 2019-01-26 MED ORDER — SODIUM CHLORIDE (PF) 0.9 % IJ SOLN
INTRAMUSCULAR | Status: AC
  Filled 2019-01-26: qty 50

## 2019-01-27 LAB — CA 125: Cancer Antigen (CA) 125: 41.4 U/mL — ABNORMAL HIGH (ref 0.0–38.1)

## 2019-01-29 ENCOUNTER — Inpatient Hospital Stay: Payer: Medicare Other

## 2019-01-29 ENCOUNTER — Telehealth: Payer: Self-pay | Admitting: Oncology

## 2019-01-29 ENCOUNTER — Inpatient Hospital Stay: Payer: Medicare Other | Attending: Gynecology | Admitting: Hematology and Oncology

## 2019-01-29 DIAGNOSIS — R42 Dizziness and giddiness: Secondary | ICD-10-CM | POA: Insufficient documentation

## 2019-01-29 DIAGNOSIS — C772 Secondary and unspecified malignant neoplasm of intra-abdominal lymph nodes: Secondary | ICD-10-CM | POA: Diagnosis not present

## 2019-01-29 DIAGNOSIS — E039 Hypothyroidism, unspecified: Secondary | ICD-10-CM | POA: Diagnosis not present

## 2019-01-29 DIAGNOSIS — G620 Drug-induced polyneuropathy: Secondary | ICD-10-CM | POA: Insufficient documentation

## 2019-01-29 DIAGNOSIS — C569 Malignant neoplasm of unspecified ovary: Secondary | ICD-10-CM | POA: Diagnosis not present

## 2019-01-29 DIAGNOSIS — Z7901 Long term (current) use of anticoagulants: Secondary | ICD-10-CM | POA: Insufficient documentation

## 2019-01-29 DIAGNOSIS — Z5111 Encounter for antineoplastic chemotherapy: Secondary | ICD-10-CM | POA: Insufficient documentation

## 2019-01-29 DIAGNOSIS — Z7189 Other specified counseling: Secondary | ICD-10-CM

## 2019-01-29 DIAGNOSIS — I10 Essential (primary) hypertension: Secondary | ICD-10-CM | POA: Insufficient documentation

## 2019-01-29 DIAGNOSIS — E785 Hyperlipidemia, unspecified: Secondary | ICD-10-CM | POA: Insufficient documentation

## 2019-01-29 DIAGNOSIS — I252 Old myocardial infarction: Secondary | ICD-10-CM | POA: Diagnosis not present

## 2019-01-29 DIAGNOSIS — D61818 Other pancytopenia: Secondary | ICD-10-CM | POA: Diagnosis not present

## 2019-01-29 DIAGNOSIS — Z9221 Personal history of antineoplastic chemotherapy: Secondary | ICD-10-CM | POA: Diagnosis not present

## 2019-01-29 DIAGNOSIS — Z79899 Other long term (current) drug therapy: Secondary | ICD-10-CM | POA: Diagnosis not present

## 2019-01-29 DIAGNOSIS — T451X5A Adverse effect of antineoplastic and immunosuppressive drugs, initial encounter: Secondary | ICD-10-CM

## 2019-01-29 NOTE — Telephone Encounter (Signed)
Left a message for [facility] with appointment to see Dr. Fermin Schwab on 02/06/19 at 12:15 pm.

## 2019-01-30 ENCOUNTER — Encounter: Payer: Self-pay | Admitting: Hematology and Oncology

## 2019-01-30 DIAGNOSIS — R42 Dizziness and giddiness: Secondary | ICD-10-CM | POA: Insufficient documentation

## 2019-01-30 NOTE — Assessment & Plan Note (Signed)
I have reviewed blood work and imaging studies with the patient and her husband Unfortunately, there are signs of mixed response with stable disease in 1 area and progression of disease in another She did not tolerate treatment well due to significant pancytopenia I am not comfortable for her to continue the current therapy We discussed treatment options briefly and consideration for tertiary referral She has declined that I recommend her to see her GYN surgeon next week for further discussion about plan of care We discussed other options or palliative care.  It appears that the patient is not willing to start chemotherapy yet

## 2019-01-30 NOTE — Assessment & Plan Note (Signed)
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 We discussed importance of Advanced Directives and Living will. She has advanced directives and living will at home.  Her husband, [name] is the medical healthcare power of attorney I recommend second opinion with GYN oncologist surgeon for treatment options.

## 2019-01-30 NOTE — Assessment & Plan Note (Signed)
She denies worsening peripheral neuropathy.  Observe only for now 

## 2019-01-30 NOTE — Progress Notes (Signed)
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  It appears that the patient is not willing to start chemotherapy yet  Pancytopenia, acquired ([place]) Despite recent dose adjustment, she has significant pancytopenia We will hold treatment until further discussion  Chemotherapy-induced peripheral neuropathy ([place]) She denies worsening peripheral neuropathy.  Observe only for now  Vertigo She has mild vertigo.  I recommend Compazine/meclizine to take as needed  Goals of care, counseling/discussion The patient is aware she has incurable disease and treatment is strictly palliative. We discussed importance of Advanced Directives and Living will. She has advanced directives and living will at home.  Her husband, Levada Dy is the medical healthcare power of attorney I recommend second opinion with GYN oncologist surgeon for treatment options.   No orders of the defined types were placed in this encounter.   INTERVAL HISTORY: Please see below for problem oriented charting. She returns with her husband for further follow-up She had recent vertigo but denies falls She has taken some meclizine recently She denies worsening peripheral neuropathy No recent infection, fever or chills The patient denies any recent  signs or symptoms of bleeding such as spontaneous epistaxis, hematuria or hematochezia.  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  Surgical consultation is advised.  No findings to suggest metastatic disease in the abdomen/pelvis    02/13/2013 Pathology Results    1. Adnexa - ovary +/- tube, neoplastic - HIGH GRADE OVARIAN POORLY DIFFERENTIATED CARCINOMA WITH ASSOCIATED TUMOR NECROSIS, 11.5 CM, INVOLVING THE OVARIAN CAPSULE AND ADJACENT FALLOPIAN TUBE. - PLEASE SEE ONCOLOGY TEMPLATE FOR DETAILS. 2. Ovary and fallopian tube, left - HIGH GRADE OVARIAN POORLY DIFFERENTIATED CARCINOMA WITH ASSOCIATED TUMOR NECROSIS, 2.3 CM, INVOLVING THE OVARIAN CAPSULE. - FALLOPIAN TUBAL TISSUE, NO EVIDENCE OF MALIGNANCY. - PLEASE SEE ONCOLOGY TEMPLATE FOR DETAILS. 3. Uterus and cervix - ENDOMETRIUM: BENIGN WEAKLY PROLIFERATIVE ENDOMETRIUM, NO ATYPIA, HYPERPLASIA OR MALIGNANCY. - CERVIX: BENIGN SQUAMOUS MUCOSA AND ENDOCERVICAL MUCOSA, NO DYSPLASIA OR MALIGNANCY. - UTERINE SEROSA: ADHESIONS, NO EVIDENCE OF ENDOMETRIOSIS, ATYPIA OR MALIGNANCY. 4. Cul-de-sac biopsy - POSITIVE FOR METASTATIC CARCINOMA (2.0 CM). 5. Soft tissue mass, simple excision, peri rectal - POSITIVE FOR METASTATIC CARCINOMA (2.6 CM). 6. Lymph nodes, regional resection, right pelvic -  SEVEN LYMPH NODES, NEGATIVE  FOR METASTATIC CARCINOMA (0/7). 7. Omentum, resection for tumor - MATURE ADIPOSE TISSUE, NO EVIDENCE OF MALIGNANCY. Microscopic Comment 1. OVARY Specimen(s): Right and left ovaries and fallopian tubes, uterus and cervix Procedure(s): Total hysterectomy, bilateral salpingo-oophorectomy Lymph node sampling performed: Yes Primary tumor site: Right and left ovaries Ovarian surface involvement: Present Maximum tumor size (cm): Right ovary: 11.5 cm; left ovary: 2.3 cm, gross measurement. 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 please see comment. Grade: High grade Peritoneal implants: No Pelvic extension: Cul-de-sac biopsy and perirectal soft tissue biopsy: positive Peritoneal washings: Rare cluster of atypical cells present ([id number]) Lymph nodes: number examined 7 ; number positive 0 TNM code: pT2c, pN0 FIGO Stage (based on pathologic findings, needs clinical correlation): II B (2014 edition) Comments: Sections of bilateral ovaries show sheets and nests of malignant epithelium resembling transitional cell carcinoma with associated tumor necrosis. No evidence of Brenner's tumor or conventional endometrioid carcinoma is identified in the background. The bilateral ovarian capsules are involved. Immunohistochemical stains were performed and the malignant cells are strongly positive for cytokeratin 7, WT-1, p16 and p53, negative for cytokeratin 20. 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 The tumor involves the right fallopian tube. Both cul-de-sac and perirectal soft tissue biopsies are positive for metastatic carcinoma. The omentum biopsy is negative for tumor.     03/20/2013 - 08/14/2013 Chemotherapy    The patient had 6 cycles of carboplatin and taxol    09/25/2013 Imaging    1. Interval resection of previously described right  pelvic mass. No evidence of recurrent or metastatic disease. 2. Small hiatal hernia. 3. Moderate amount of stool within the rectum. Question constipation or even mild fecal impaction.    04/03/2015 PET scan    1. Highly hypermetabolic height bowed dense mass in the medial spleen compatible with malignancy. 2. Bilateral pelvic sidewall adenopathy, right greater than left, hypermetabolic, with small peritoneal implants of tumor in the lower pelvis, and with several faint but suspected omental implants of tumor. There is also a hypermetabolic aortocaval lymph node in the retroperitoneum. 3. Generalized increased thyroid activity could be due to thyroiditis. Although a well-defined nodule is not seen in the right thyroid lobe, there is focal increased hypermetabolic activity in the right thyroid lobe. Thyroid ultrasound is recommended for further investigation. 4. I suspect that the high activity between the spinous processes of L3 and L4 is degenerative.    05/01/2015 - 10/21/2015 Chemotherapy    The patient had 8 cycles of carboplatin, taxol and Avastin.      07/02/2015 Imaging    1. Decrease in size of these splenic lesion. Suspect tumor also involving the adjacent posterior wall of the stomach. 2. Improved pelvic lymphadenopathy. 3. No new omental or peritoneal surface disease.     09/10/2015 Imaging    1. Response to therapy, with further decreased size of a medial splenic lesion. The previously questioned extension into the stomach is no longer identified and may have been artifactual on the prior exam. Of note, an inferior splenic low-density capsular based lesion is felt to be similar and warrants followup attention. 2. No adenopathy or new peritoneal disease identified. 3.  Possible constipation. 4.  Advanced aortic and branch vessel atherosclerosis.    11/06/2015 Imaging    1. Mass arising from the medial spleen and involving the posterior wall of stomach demonstrates mild decrease in size  in the interval. 2. Interval improvement and previously referenced pathologic iliac lymph nodes. 3. Previously referenced peritoneal implants within the pelvis are no longer measurable. No new areas of peritoneal metastasis is identified. 4. There is a right hilar node which is borderline enlarged but is increased in size when compared with 04/19/2013. Attention on follow-up examination is recommended.    11/18/2015 PET scan    1. Residual hypermetabolic tumor implant between the stomach and spleen, significantly decreased in size and mildly decreased in metabolism compared to 04/03/2015 PET-CT, and slightly decreased in size since 11/06/2015. 2. Residual hypermetabolic right deep pelvic side wall lymph node, with partial treatment response compared to 04/03/2015 PET-CT. 3. No new sites of hypermetabolic metastatic disease. 4. Stable diffuse thyroid hypermetabolism without discrete thyroid nodule, most suggestive of diffuse thyroiditis. Recommend correlation with serum thyroid function tests.     01/28/2016 - 11/26/2016 Anti-estrogen oral therapy    She was placed on Letrozole, stopped due to poor tolerance    02/09/2016 Imaging    1. Continued decrease in size of peritoneal lesion between the posterior wall of stomach and spleen. 2. No new or progressive disease identified    06/23/2016 Imaging    1. Near complete resolution of subtle residual soft tissue density lesion in the gastrosplenic ligament. 2. No new sites of disease identified. 3. Stool burden suggests constipation or even fecal impaction    12/01/2016 Imaging    1. No CT findings for residual or recurrent abdominal/pelvic ovarian cancer. 2. No acute abdominal/pelvic findings. 3. Large amount of stool throughout the colon and down into the rectum suggesting constipation. 4. Stable atherosclerotic calcifications involving the aorta and iliac arteries.    03/29/2017 - 10/29/2017 Anti-estrogen oral therapy    She was on Aromasin     04/19/2017 Imaging    1. Suspicion of peritoneal recurrence with enlarging implants in the gastrosplenic ligament, left adnexa and posterior to the cecum. No generalized ascites, adenopathy or definite solid organ disease. 2. No evidence of bowel or ureteral obstruction. 3.  Aortic Atherosclerosis (ICD10-I70.0).    06/25/2017 Genetic Testing    Patient has genetic testing done for Breast/ovarian cancer panel Results revealed patient has no detectable mutation    09/01/2017 Tumor Marker    Patient's tumor was tested for the following markers: CA-125 Results of the tumor marker test revealed 40.9    10/29/2017 - 03/24/2018 Anti-estrogen oral therapy    She was on tamoxifen    11/05/2017 Tumor Marker    Patient's tumor was tested for the following markers: CA-125 Results of the tumor marker test revealed 84.4    11/15/2017 Imaging    1. Mild progression of peritoneal metastasis. 2. Possible constipation. No obstruction or other acute complication. 3.  Aortic Atherosclerosis (ICD10-I70.0).    03/21/2018 Tumor Marker    Patient's tumor was tested for the following markers: CA-125 Results of the tumor marker test revealed 101.3.    04/03/2018 Tumor Marker    Patient's tumor was tested for the following markers: CA-125 Results of the tumor marker test revealed 120.9    04/10/2018 - 07/17/2018 Chemotherapy    The patient had single agent carboplatin x 5 cycles    05/02/2018 Tumor Marker    Patient's tumor was tested for the following markers: CA-125 Results of the tumor marker test revealed 68.2    05/29/2018 Tumor Marker    Patient's tumor was tested for the following markers: CA-125 Results of the tumor marker test revealed 41.7  Genetic Testing    Patient has genetic testing done for HRD. Results revealed: negative for deleterious BRCA1 or BRCA2 mutations in tumor.     06/26/2018 Tumor Marker    Patient's tumor was tested for the following markers: CA-125 Results of the tumor marker  test revealed 29.5    07/14/2018 Imaging    Interval decrease in size of bilateral adnexal masses.  No significant change in size of peritoneal metastasis in the left upper quadrant.  No new or progressive metastatic disease identified.    08/07/2018 - 11/07/2018 Chemotherapy    The patient had zejula    08/14/2018 Tumor Marker    Patient's tumor was tested for the following markers: CA-125 Results of the tumor marker test revealed 31.7    10/16/2018 Tumor Marker    Patient's tumor was tested for the following markers: CA-125 Results of the tumor marker test revealed 49    11/06/2018 Imaging    1. Mild to moderate enlargement of the left upper quadrant lesion along the gastric wall and pancreatic tail suspicious for malignancy. Subtle invasion of the capsule of the spleen as before. 2. Stable soft tissue nodularity along the right adnexa. Slightly reduced soft tissue prominence along the left adnexa. By report the patient has had bilateral salpingo oophorectomy and accordingly these adnexal lesions are concerning for tumor. 3. No new areas of tumor nodularity are identified. 4. Other imaging findings of potential clinical significance: Pelvic floor laxity with small cystocele. Prominent stool throughout the colon favors constipation. Upper normal sized left periaortic lymph node. Bibasilar cylindrical bronchiectasis. Aortic Atherosclerosis (ICD10-I70.0). Coronary atherosclerosis.    11/06/2018 Tumor Marker    Patient's tumor was tested for the following markers: CA-125 Results of the tumor marker test revealed 61.5    11/25/2018 - 01/08/2019 Chemotherapy    The patient had carboplatin and taxol    12/18/2018 Tumor Marker    Patient's tumor was tested for the following markers: CA-125 Results of the tumor marker test revealed 55.3    01/26/2019 Imaging    3.2 x 4.2 cm low-density lesion along the posterior aspect of the gastric cardia, abutting the splenic hilum and pancreatic  tail, suspicious for metastasis. This is stable versus mildly decreased.  2.9 x 2.8 cm mass along the posterior aspect of the cecum (previously described as right adnexal on prior CT), favoring cecal/appendiceal neoplasm, grossly unchanged.  2.6 cm left pelvic/adnexal implant, suspicious for metastasis, mildly increased.  11 mm short axis left para-aortic nodal metastasis, mildly increased.  Low-density splenic lesions along the posterior spleen, new, indeterminate.    01/26/2019 Tumor Marker    Patient's tumor was tested for the following markers: CA-125 Results of the tumor marker test revealed 41.4     REVIEW OF SYSTEMS:   Constitutional: Denies fevers, chills or abnormal weight loss Eyes: Denies blurriness of vision Ears, nose, mouth, throat, and face: Denies mucositis or sore throat Respiratory: Denies cough, dyspnea or wheezes Cardiovascular: Denies palpitation, chest discomfort or lower extremity swelling Gastrointestinal:  Denies nausea, heartburn or change in bowel habits Skin: Denies abnormal skin rashes Lymphatics: Denies new lymphadenopathy or easy bruising Neurological:Denies numbness, tingling or new weaknesses Behavioral/Psych: Mood is stable, no new changes  All other systems were reviewed with the patient and are negative.  I have reviewed the past medical history, past surgical history, social history and family history with the patient and they are unchanged from previous note.  ALLERGIES:  is allergic to clonidine derivatives; coreg [carvedilol]; caffeine; codeine; flexeril [  cyclobenzaprine]; lipitor [atorvastatin]; lisinopril; pravastatin; tape; tegaderm ag mesh [silver]; and ultram [tramadol].  MEDICATIONS:  Current Outpatient Medications  Medication Sig Dispense Refill  . acetaminophen (TYLENOL) 500 MG tablet Take 1,000 mg by mouth every 6 (six) hours as needed for mild pain.     Marland Kitchen alendronate (FOSAMAX) 70 MG tablet Take 70 mg by mouth once a week.  Take with a full glass of water on an empty stomach.    Marland Kitchen amiodarone (PACERONE) 200 MG tablet Take 100 mg by mouth daily.  3  . amiodarone (PACERONE) 200 MG tablet TAKE 1/2 TABLET BY MOUTH DAILY 45 tablet 3  . amoxicillin (AMOXIL) 500 MG capsule Take 2,000 mg by mouth See admin instructions. Takes 4 capsules 1 hour prior to procedure    . Biotin 10 MG TABS Take 20 mg by mouth daily.    Marland Kitchen desonide (DESOWEN) 0.05 % cream Apply 1 application topically 2 (two) times daily.   1  . dexamethasone (DECADRON) 4 MG tablet Take 3 tabs at the night before and 3 tabs the morning of chemotherapy, every 3 weeks, by mouth 36 tablet 0  . diphenhydrAMINE (BENADRYL) 25 MG tablet Take 50 mg by mouth at bedtime as needed for sleep.     Marland Kitchen docusate sodium (COLACE) 100 MG capsule Take 250 mg by mouth 2 (two) times daily.     Marland Kitchen ELIQUIS 5 MG TABS tablet TAKE 1 TABLET BY MOUTH TWICE A DAY 60 tablet 4  . Evolocumab with Infusor 420 MG/3.5ML SOCT Inject into the skin.    Marland Kitchen ezetimibe (ZETIA) 10 MG tablet Take 1 tablet (10 mg total) by mouth daily. (Patient taking differently: Take 10 mg by mouth at bedtime. ) 30 tablet 6  . Glucosamine HCl 1000 MG TABS Take 1,000 mg by mouth daily.    . hydrocortisone 1 % ointment Apply 1 application topically 2 (two) times daily as needed for itching (eczema).     Marland Kitchen ipratropium (ATROVENT) 0.06 % nasal spray Place 2 sprays into both nostrils daily.   5  . levothyroxine (SYNTHROID) 88 MCG tablet Take 88 mcg by mouth daily before breakfast.    . lidocaine-prilocaine (EMLA) cream Apply to Porta-Cath site 1-2 hours prior to access as directed. 30 g 1  . linaclotide (LINZESS) 145 MCG CAPS capsule Take 145 mcg by mouth daily before breakfast.    . magnesium hydroxide (MILK OF MAGNESIA) 800 MG/5ML suspension Take 30 mLs by mouth daily as needed for constipation. Reported on 11/20/2015    . Melatonin 5 MG CAPS Take 5 mg by mouth at bedtime as needed (sleep). Reported on 12/25/2015    . Menthol,  Topical Analgesic, (ICY HOT EX) Apply 1 application topically 2 (two) times daily as needed (PAIN).     . Multiple Vitamin (MULTIVITAMIN WITH MINERALS) TABS tablet Take 1 tablet by mouth daily. Centrum Silver    . ondansetron (ZOFRAN) 8 MG tablet TAKE 1 TABLET (8 MG TOTAL) BY MOUTH EVERY 8 (EIGHT) HOURS AS NEEDED FOR NAUSEA OR VOMITING. 90 tablet 1  . OVER THE COUNTER MEDICATION Co210 20 mg once a day for muscle aches.    Vladimir Faster Glycol-Propyl Glycol (SYSTANE OP) Place 1 drop into both eyes 2 (two) times daily.     . polyethylene glycol (MIRALAX / GLYCOLAX) packet Take 17 g by mouth daily. Mix in 8 oz liquid and drink    . potassium chloride (K-DUR) 10 MEQ tablet Take 10 mEq by mouth daily.    Marland Kitchen  prochlorperazine (COMPAZINE) 10 MG tablet Take 1 tablet (10 mg total) by mouth every 6 (six) hours as needed (Nausea or vomiting). 30 tablet 1  . SHINGRIX injection      No current facility-administered medications for this visit.    Facility-Administered Medications Ordered in Other Visits  Medication Dose Route Frequency Provider Last Rate Last Dose  . sodium chloride flush (NS) 0.9 % injection 10 mL  10 mL Intravenous PRN Joylene John D, NP   10 mL at 01/06/18 1220    PHYSICAL EXAMINATION: ECOG PERFORMANCE STATUS: 2 - Symptomatic, <50% confined to bed  Vitals:   01/29/19 0858  BP: (!) 145/72  Pulse: 85  Resp: 18  Temp: 97.8 F (36.6 C)  SpO2: 100%   Filed Weights   01/29/19 0858  Weight: 134 lb (60.8 kg)    GENERAL:alert, no distress and comfortable SKIN: skin color, texture, turgor are normal, no rashes or significant lesions EYES: normal, Conjunctiva are pink and non-injected, sclera clear OROPHARYNX:no exudate, no erythema and lips, buccal mucosa, and tongue normal  NECK: supple, thyroid normal size, non-tender, without nodularity LYMPH:  no palpable lymphadenopathy in the cervical, axillary or inguinal LUNGS: clear to auscultation and percussion with normal breathing  effort HEART: regular rate & rhythm and no murmurs and no lower extremity edema ABDOMEN:abdomen soft, non-tender and normal bowel sounds Musculoskeletal:no cyanosis of digits and no clubbing  NEURO: alert & oriented x 3 with fluent speech, no focal motor/sensory deficits  LABORATORY DATA:  I have reviewed the data as listed    Component Value Date/Time   NA 142 01/26/2019 0747   NA 141 11/11/2017 1145   K 3.6 01/26/2019 0747   K 4.0 11/11/2017 1145   CL 105 01/26/2019 0747   CL 103 05/21/2013 1105   CO2 29 01/26/2019 0747   CO2 29 11/11/2017 1145   GLUCOSE 84 01/26/2019 0747   GLUCOSE 81 11/11/2017 1145   GLUCOSE 74 05/21/2013 1105   BUN 9 01/26/2019 0747   BUN 10.9 11/11/2017 1145   CREATININE 0.71 01/26/2019 0747   CREATININE 0.7 11/11/2017 1145   CALCIUM 8.6 (L) 01/26/2019 0747   CALCIUM 9.6 11/11/2017 1145   PROT 6.1 (L) 01/26/2019 0747   PROT 6.5 11/24/2016 1104   ALBUMIN 3.8 01/26/2019 0747   ALBUMIN 3.9 11/24/2016 1104   AST 18 01/26/2019 0747   AST 18 11/24/2016 1104   ALT 8 01/26/2019 0747   ALT 15 11/24/2016 1104   ALKPHOS 88 01/26/2019 0747   ALKPHOS 104 11/24/2016 1104   BILITOT 0.5 01/26/2019 0747   BILITOT 0.57 11/24/2016 1104   GFRNONAA >60 01/26/2019 0747   GFRAA >60 01/26/2019 0747    No results found for: SPEP, UPEP  Lab Results  Component Value Date   WBC 2.1 (L) 01/26/2019   NEUTROABS 0.6 (L) 01/26/2019   HGB 9.5 (L) 01/26/2019   HCT 29.4 (L) 01/26/2019   MCV 106.5 (H) 01/26/2019   PLT 131 (L) 01/26/2019      Chemistry      Component Value Date/Time   NA 142 01/26/2019 0747   NA 141 11/11/2017 1145   K 3.6 01/26/2019 0747   K 4.0 11/11/2017 1145   CL 105 01/26/2019 0747   CL 103 05/21/2013 1105   CO2 29 01/26/2019 0747   CO2 29 11/11/2017 1145   BUN 9 01/26/2019 0747   BUN 10.9 11/11/2017 1145   CREATININE 0.71 01/26/2019 0747   CREATININE 0.7 11/11/2017 1145  Component Value Date/Time   CALCIUM 8.6 (L) 01/26/2019 0747    CALCIUM 9.6 11/11/2017 1145   ALKPHOS 88 01/26/2019 0747   ALKPHOS 104 11/24/2016 1104   AST 18 01/26/2019 0747   AST 18 11/24/2016 1104   ALT 8 01/26/2019 0747   ALT 15 11/24/2016 1104   BILITOT 0.5 01/26/2019 0747   BILITOT 0.57 11/24/2016 1104       RADIOGRAPHIC STUDIES: I have reviewed multiple imaging studies with the patient and her husband I have personally reviewed the radiological images as listed and agreed with the findings in the report. Ct Abdomen Pelvis W Contrast  Result Date: 01/26/2019 CLINICAL DATA:  Recurrent ovarian cancer EXAM: CT ABDOMEN AND PELVIS WITH CONTRAST TECHNIQUE: Multidetector CT imaging of the abdomen and pelvis was performed using the standard protocol following bolus administration of intravenous contrast. CONTRAST:  151m OMNIPAQUE IOHEXOL 300 MG/ML  SOLN COMPARISON:  11/06/2018 FINDINGS: Lower chest: Lung bases are clear. Hepatobiliary: Liver is within normal limits. Gallbladder unremarkable. No intrahepatic or extrahepatic ductal dilatation. Pancreas: Grossly unremarkable, noting a left upper quadrant lesion (described below) which abuts the pancreatic tail. Spleen: Low-density splenic lesions along the posterior spleen (series 2/image 16). Left upper quadrant lesion (described below) abuts the splenic hilum. Adrenals/Urinary Tract: Adrenal glands are within normal limits. Subcentimeter right renal cyst. Left kidney is within normal limits. No hydronephrosis. Bladder is low-lying but unremarkable. Stomach/Bowel: 3.2 x 4.2 cm low-density lesion along the posterior aspect of the gastric cardia (series 2/image 18), previously 3.2 x 5.0 cm, stable versus mildly decreased. 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 No evidence of bowel obstruction. Moderate colonic stool burden, suggesting  constipation. Vascular/Lymphatic: No evidence of abdominal aortic aneurysm. Atherosclerotic calcifications of the abdominal aorta and branch vessels. 11 mm short axis left para-aortic node (series 2/image 26), previously 9 mm. Reproductive: Status post hysterectomy and bilateral salpingo-oophorectomy. 2.6 x 2.4 cm left pelvic/adnexal implant (series 2/image 55), previously 2.1 x 2.1 cm. Other: No abdominopelvic ascites. Musculoskeletal: Degenerative changes of the visualized thoracolumbar spine. Right hip arthroplasty, without evidence of complication. IMPRESSION: 3.2 x 4.2 cm low-density lesion along the posterior aspect of the gastric cardia, abutting the splenic hilum and pancreatic tail, suspicious for metastasis. This is stable versus mildly decreased. 2.9 x 2.8 cm mass along the posterior aspect of the cecum (previously described as right adnexal on prior CT), favoring cecal/appendiceal neoplasm, grossly unchanged. 2.6 cm left pelvic/adnexal implant, suspicious for metastasis, mildly increased. 11 mm short axis left para-aortic nodal metastasis, mildly increased. Low-density splenic lesions along the posterior spleen, new, indeterminate. Electronically Signed   By: SJulian HyM.D.   On: 01/26/2019 12:07    All questions were answered. The patient knows to call the clinic with any problems, questions or concerns. No barriers to learning was detected.  I spent 25 minutes counseling the patient face to face. The total time spent in the appointment was 40 minutes and more than 50% was on counseling and review of test results  NHeath Lark MD 01/30/2019 2:40 PM

## 2019-01-30 NOTE — Assessment & Plan Note (Signed)
Despite recent dose adjustment, she has significant pancytopenia We will hold treatment until further discussion

## 2019-01-30 NOTE — Assessment & Plan Note (Signed)
She has mild vertigo.  I recommend Compazine/meclizine to take as needed

## 2019-02-06 ENCOUNTER — Encounter: Payer: Self-pay | Admitting: Oncology

## 2019-02-06 ENCOUNTER — Telehealth: Payer: Self-pay | Admitting: Oncology

## 2019-02-06 ENCOUNTER — Encounter: Payer: Self-pay | Admitting: Gynecology

## 2019-02-06 ENCOUNTER — Inpatient Hospital Stay (HOSPITAL_BASED_OUTPATIENT_CLINIC_OR_DEPARTMENT_OTHER): Payer: Medicare Other | Admitting: Gynecology

## 2019-02-06 VITALS — BP 159/69 | HR 70 | Temp 97.7°F | Resp 18 | Ht 63.0 in | Wt 130.4 lb

## 2019-02-06 DIAGNOSIS — C569 Malignant neoplasm of unspecified ovary: Secondary | ICD-10-CM

## 2019-02-06 DIAGNOSIS — C772 Secondary and unspecified malignant neoplasm of intra-abdominal lymph nodes: Secondary | ICD-10-CM | POA: Diagnosis not present

## 2019-02-06 DIAGNOSIS — Z9221 Personal history of antineoplastic chemotherapy: Secondary | ICD-10-CM | POA: Diagnosis not present

## 2019-02-06 DIAGNOSIS — Z5111 Encounter for antineoplastic chemotherapy: Secondary | ICD-10-CM | POA: Diagnosis not present

## 2019-02-06 NOTE — Telephone Encounter (Signed)
Anselm Jungling with appointment for Dr. Alvy Bimler on 02/13/19 at 10:30.

## 2019-02-06 NOTE — Progress Notes (Signed)
Consult Note: Gyn-Onc   [name] 78 y.o. female  Chief Complaint  Patient presents with  . 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  I again reviewed with the patient and her husband options.  Most recently she is received 3 cycles of carboplatin and Taxol and a follow-up CT shows a mixed response well the Ca1 25 value is relatively stable.  She has had some difficulties with neutropenia and thrombocytopenia.  Remarkably the patient feels very well and is not really having symptoms associated with her cancer. The patient has a number of treatment options available which I indicated to the patient and her husband have about equal chance of resulting in a response.  On the other hand each has slightly different toxicities.  These treatments might include:  Oral etoposide (50 mg a day for 21 days out of a 28-day cycle).  We are usually able to have this covered by insurance at [facility].  Clear the patient would need to work with financial counselors here at the cancer center with regard to this particular medication.  Doxil.  We discussed side effects of Doxil  Gemcitabine as either a single agent 2 weeks in a row versus a regimen of gemcitabine and cisplatin every other week.  Weekly Taxol.  Finally we discussed possible targeted therapies and we will see if there remains tissue from her primary surgery that could be profiled.  The patient will return to care of Dr. Rozetta Nunnery to discuss the treatment options I have outlined above and to receive Dr. Jeneen Rinks input as to which regimen she believes is [place] would tolerate best..   Interval History:   The patient returns today to seek opinion regarding further management.  The patient most recently has received 3 cycles of carboplatin and Taxol and based on a  follow-up CT scan after 3 cycles has had a mixed response.Marland Kitchen  Her Ca1 25 is been relatively stable and in fact has gone down slightly.  Remarkably, the patient denies any GI GU or pelvic symptoms.  Her functional status is good.Marland Kitchen  HPI:The patient initially presented with a pelvic mass and elevated CA 125 (178 units per mL) She underwent exploratory laparotomy and debulking on 02/13/2013. Final pathology showed a poorly differentiated ovarian cancer involving both ovaries pelvic peritoneum and rectal muscularis. All gross disease was resected.  She then received 6 cycles of carboplatin and Taxol chemotherapy completed in September 2014. At the completion of chemotherapy her CA 125 was 18 units per mL.   In May 2016 the patient CA-125 began to rise and a PET CT scan showed metastatic disease in the spleen and pelvic lymph nodes. Given the long platinum free interval , we retreated with carboplatin, Taxol, and  Avastin. She received 6 cycles of carboplatin and Taxol and  Avastin the last being administered in September 2016. She had a significant response with CA 125 falling to 12 units per mL in September. CT scan showed persistent disease in the spleen (which was actually responding). Patient received a total 8 cycles and on follow-up in December 2016 had persistent disease in the spleen which had slightly decreased in size. At that time CA-125 is 14 units per mL.  In March 2017 we switch the patient to letrozole.   A CT scan obtained 06/23/2016 showed a response with the spleen met measure  1.5 x 1.5 cm (previously 2.4 x 3.9 cm). Because of side effects resolve was discontinued in November 2017. A follow-up CT scan on 12/01/2016 showed no evidence of the splenic lesion and her CA-125 at that time was 14 units per mL.  In May 2018 CA-125 rose to 24 units per mL and a follow-up CT scan showed new lesions in the gastrosplenic ligament ( 2.5 cm) and a lesion in the left pelvis measuring 2.4 cm. Because she had  previously responded to Letrozole ( although she did not tolerate let letrozole very well) we began Aromasin 25 mg daily.  December 2018 we switched to tamoxifen 20 mg a day.  After 2 months she seemed to have a relatively stable Ca1 25 but after 4 months Ca1 25 it risen to 101 units/mL.Marland Kitchen  August 2019 started on Niraparib 100 milligrams daily.  By December 2019 the patient had a rising Ca1 25 and was then switched back to a combination carboplatin and Taxol after 3 cycles her Ca1 25 was relatively stable but CT scan.on January 26, 2019 showed a mixed response as follows:  3.2 x 4.2 cm low-density lesion along the posterior aspect of the gastric cardia, abutting the splenic hilum and pancreatic tail, suspicious for metastasis. This is stable versus mildly decreased.  2.9 x 2.8 cm mass along the posterior aspect of the cecum (previously described as right adnexal on prior CT), favoring cecal/appendiceal neoplasm, grossly unchanged.  2.6 cm left pelvic/adnexal implant, suspicious for metastasis, mildly increased.  11 mm short axis left para-aortic nodal metastasis, mildly increased.  Low-density splenic lesions along the posterior spleen, new, indeterminate.  Review of the patient's laboratory work shows that she has a chronically low white count hovering around 3000 and she has had difficulties with thrombocytopenia down to 90,000 on several occasions.     Review of Systems:10 point review of systems is negative except as noted in interval history.   Vitals: Blood pressure (!) 159/69, pulse 70, temperature 97.7 F (36.5 C), temperature source Oral, resp. rate 18, height _0  (1.6 m), weight 130 lb 6.4 oz (59.1 kg), SpO2 100 %.  Physical Exam: General : The patient is a healthy woman in no acute distress.  HEENT: normocephalic, extraoccular movements normal; neck is supple without thyromegally , Port-A-Cath appears healthy is no evidence of infection in the right chest  wall. Lynphnodes: Supraclavicular and inguinal nodes not enlarged  Abdomen: Soft, non-tender, no ascites, no organomegally, no masses, no hernias , midline incision is healing well  Pelvic:    EGBUS: Normal female  Vagina bladder urethra: Normal  Vaginal cuff is well healed.  Cervix and uterus are surgically absent  Bimanual exam: No masses nodularity or fullness.  Rectovaginal exam confirms      Lower extremities: No edema or varicosities. Normal range of motion      Allergies  Allergen Reactions  . Clonidine Derivatives Shortness Of Breath  . Coreg [Carvedilol] Shortness Of Breath  . Caffeine Other (See Comments)    Makes heart race  . Codeine Other (See Comments)    Does not like the feeling she gets  . Flexeril [Cyclobenzaprine] Other (See Comments)    Pt states "increased heart rate"  . Lipitor [Atorvastatin] Dermatitis    "feels like bugs are biting her"   . Lisinopril     LIP NUMBNESS  . Pravastatin Other (See Comments)    FEELS LIKE "BUG BITES" BITING HER LEGS   . Tape Rash    TEGADERM.   (  use opsite on PAC)  . Tegaderm Ag Mesh [Silver] Rash and Other (See Comments)    Burns skin  . Ultram [Tramadol] Palpitations    Past Medical History:  Diagnosis Date  . Arthritis    OSTEOARTHRITIS   -- CONSTANT PAIN RIGHT HIP---AND PAIN LEFT KNEE--PT STATES SHE GETS INJECTIONS INTO HER KNEE  . Complication of anesthesia    BLOOD PRESSURE DROPPED WITH NASAL SURGERY, ONE OF THE CARPAL TUNNEL REPAIRS AND DURING A COLONOSCOPY  . Dyslipidemia   . History of skin cancer   . Hypertension   . Hypothyroidism   . Menopausal symptoms   . NSTEMI (non-ST elevated myocardial infarction) ([place]) 12/09/15   Medical management: Distal branch of D1 95%, ostial D2 75%, distal LAD 50%. Tortuous arteries consistent with hypertension  . Ovarian cancer ([place]) 01/2013   Recurrence since 2014/2016  . PAF (paroxysmal atrial fibrillation) ([place])    On ELIQUIS; On Amiodarone (Eye Exam 12/25/15)  - no longer on Rythmol  . Stroke [facility]) 2017   no deficits    Past Surgical History:  Procedure Laterality Date  . BILATERAL CARPAL TUNNEL REPAIR  2007  . BREAST BIOPSY Left 03/19/2014   benign  . CARDIAC CATHETERIZATION N/A 12/05/2015   Procedure: Left Heart Cath and Coronary Angiography;  Surgeon: Belva Crome, MD;  Location: [facility];  Service: Cardiovascular;  distal branch of D1 95%, ostial D2 75%, dLAD 50%,p-mRCA 40%. Tortuous vessels.  Marland Kitchen DILATION AND CURETTAGE OF UTERUS  1969  . JOINT REPLACEMENT    . LAPAROTOMY Bilateral 02/13/2013   Procedure: EXPLORATORY LAPAROTOMY TOTAL ABDOMINAL HYSTERECTOMY BILATERAL SALPINGO-OOPHORECTOMY, Partial Rectal Resection with Reanastamosis;  Surgeon: Alvino Chapel, MD;  Location: [facility];  Service: Gynecology;  Laterality: Bilateral;  . LEFT KNEE ARTHROSCOPY   2011  . LYMPHADENECTOMY Right 02/13/2013   Procedure: PEVLIC  LYMPHADENECTOMY, DEBULKING right pelvic tumor nodules;  Surgeon: Alvino Chapel, MD;  Location: [facility];  Service: Gynecology;  Laterality: Right;  . NM MYOVIEW LTD  June 2010    subbmaximal with no ischemia or infarction.  . OMENTECTOMY  02/13/2013   Procedure: OMENTECTOMY;  Surgeon: Alvino Chapel, MD;  Location: [facility];  Service: Gynecology;;  . [place]  . SURGERY FOR RUPTURED OVARIAN CYST  1969  . SYNOVECTOMY WITH POLY EXCHANGE Left 03/18/2017   Procedure: LEFT KNEE PARTIAL SYNOVECTOMY WITH POLY EXCHANGE;  Surgeon: Mcarthur Rossetti, MD;  Location: [facility];  Service: Orthopedics;  Laterality: Left;  Adductor Block  . TAH/BSO/Tumor debulking with right pelvic LND  01/2013  . TONSILLECTOMY  1962  . TOTAL HIP ARTHROPLASTY  02/25/2012   Procedure: TOTAL HIP ARTHROPLASTY ANTERIOR APPROACH;  Surgeon: Mcarthur Rossetti, MD;  Location: [facility];  Service: Orthopedics;  Laterality: Right;  . TOTAL KNEE ARTHROPLASTY Left 01/03/2015   Procedure: LEFT TOTAL KNEE ARTHROPLASTY;   Surgeon: Mcarthur Rossetti, MD;  Location: [facility];  Service: Orthopedics;  Laterality: Left;  . TRANSTHORACIC ECHOCARDIOGRAM  May 2014; January 2016   a. Normal LV size function. EF 60-65%. Grade 1 diastolic function. Mild MR and mildly elevated PA pressures of 37 mmHg per;; b. EF 60-65%. Mobile echodensity 10 mm x 6 mm attest interventricular septum is questionable fibroblastoma.  Marland Kitchen TRIGGER FINGER RELEASE Left 12/20/2017   Procedure: RELEASE TRIGGER FINGER/A-1 PULLEY LEFT RING FINGER;  Surgeon: Daryll Brod, MD;  Location: [facility];  Service: Orthopedics;  Laterality: Left;    Current Outpatient Medications  Medication Sig Dispense Refill  . acetaminophen (TYLENOL) 500 MG tablet Take 1,000 mg by mouth every 6 (six) hours as needed for mild pain.     Marland Kitchen alendronate (FOSAMAX) 70 MG tablet Take 70 mg by mouth once a week. Take with a full glass of water on an empty stomach.    Marland Kitchen amiodarone (PACERONE) 200 MG tablet Take 100 mg by mouth daily.  3  . amiodarone (PACERONE) 200 MG tablet TAKE 1/2 TABLET BY MOUTH DAILY 45 tablet 3  . amoxicillin (AMOXIL) 500 MG capsule Take 2,000 mg by mouth See admin instructions. Takes 4 capsules 1 hour prior to procedure    . Biotin 10 MG TABS Take 20 mg by mouth daily.    Marland Kitchen desonide (DESOWEN) 0.05 % cream Apply 1 application topically 2 (two) times daily.   1  . dexamethasone (DECADRON) 4 MG tablet Take 3 tabs at the night before and 3 tabs the morning of chemotherapy, every 3 weeks, by mouth 36 tablet 0  . diphenhydrAMINE (BENADRYL) 25 MG tablet Take 50 mg by mouth at bedtime as needed for sleep.     Marland Kitchen docusate sodium (COLACE) 100 MG capsule Take 250 mg by mouth 2 (two) times daily.     Marland Kitchen ELIQUIS 5 MG TABS tablet TAKE 1 TABLET BY MOUTH TWICE A DAY 60 tablet 4  . Evolocumab with Infusor 420 MG/3.5ML SOCT Inject into the skin.    Marland Kitchen ezetimibe (ZETIA) 10 MG tablet Take 1 tablet (10 mg total) by mouth daily. (Patient taking differently: Take 10 mg by  mouth at bedtime. ) 30 tablet 6  . Glucosamine HCl 1000 MG TABS Take 1,000 mg by mouth daily.    . hydrocortisone 1 % ointment Apply 1 application topically 2 (two) times daily as needed for itching (eczema).     Marland Kitchen ipratropium (ATROVENT) 0.06 % nasal spray Place 2 sprays into both nostrils daily.   5  . levothyroxine (SYNTHROID) 88 MCG tablet Take 88 mcg by mouth daily before breakfast.    . lidocaine-prilocaine (EMLA) cream Apply to Porta-Cath site 1-2 hours prior to access as directed. 30 g 1  . linaclotide (LINZESS) 145 MCG CAPS capsule Take 145 mcg by mouth daily before breakfast.    . magnesium hydroxide (MILK OF MAGNESIA) 800 MG/5ML suspension Take 30 mLs by mouth daily as needed for constipation. Reported on 11/20/2015    . Melatonin 5 MG CAPS Take 5 mg by mouth at bedtime as needed (sleep). Reported on 12/25/2015    . Menthol, Topical Analgesic, (ICY HOT EX) Apply 1 application topically 2 (two) times daily as needed (PAIN).     . Multiple Vitamin (MULTIVITAMIN WITH MINERALS) TABS tablet Take 1 tablet by mouth daily. Centrum Silver    . ondansetron (ZOFRAN) 8 MG tablet TAKE 1 TABLET (8 MG TOTAL) BY MOUTH EVERY 8 (EIGHT) HOURS AS NEEDED FOR NAUSEA OR VOMITING. 90 tablet 1  . OVER THE COUNTER MEDICATION Co210 20 mg once a day for muscle aches.    Vladimir Faster Glycol-Propyl Glycol (SYSTANE OP) Place 1 drop into both eyes 2 (two) times daily.     . polyethylene glycol (MIRALAX / GLYCOLAX) packet Take 17 g by mouth daily. Mix in 8 oz liquid and drink    . potassium chloride (K-DUR) 10 MEQ tablet Take 10 mEq by mouth daily.    . prochlorperazine (COMPAZINE) 10 MG tablet Take 1 tablet (10 mg total) by mouth every 6 (six) hours as needed (Nausea or  vomiting). 30 tablet 1  . SHINGRIX injection      No current facility-administered medications for this visit.    Facility-Administered Medications Ordered in Other Visits  Medication Dose Route Frequency Provider Last Rate Last Dose  . sodium  chloride flush (NS) 0.9 % injection 10 mL  10 mL Intravenous PRN Joylene John D, NP   10 mL at 01/06/18 1220    Social History   Socioeconomic History  . Marital status: Married    Spouse name: Levada Dy  . Number of children: 3  . Years of education: Not on file  . Highest education level: Not on file  Occupational History  . Occupation: retired Animal nutritionist  . Financial resource strain: Not on file  . Food insecurity:    Worry: Not on file    Inability: Not on file  . Transportation needs:    Medical: Not on file    Non-medical: Not on file  Tobacco Use  . Smoking status: Never Smoker  . Smokeless tobacco: Never Used  Substance and Sexual Activity  . Alcohol use: No    Alcohol/week: 0.0 standard drinks  . Drug use: No  . Sexual activity: Not Currently  Lifestyle  . Physical activity:    Days per week: Not on file    Minutes per session: Not on file  . Stress: Not on file  Relationships  . Social connections:    Talks on phone: Not on file    Gets together: Not on file    Attends religious service: Not on file    Active member of club or organization: Not on file    Attends meetings of clubs or organizations: Not on file    Relationship status: Not on file  . Intimate partner violence:    Fear of current or ex partner: Not on file    Emotionally abused: Not on file    Physically abused: Not on file    Forced sexual activity: Not on file  Other Topics Concern  . Not on file  Social History Narrative   Married mother of 3, grandmother 70.   Exercises 6/7 days a week walking 30 minutes a time.   Never smoked or drank alcohol.    Family History  Problem Relation Age of Onset  . Hypertension Mother   . Basal cell carcinoma Mother   . AAA (abdominal aortic aneurysm) Father   . Heart Problems Maternal Uncle   . Heart Problems Maternal Grandfather   . AAA (abdominal aortic aneurysm) Paternal Grandmother   . Prostate cancer Maternal Uncle 70  . Prostate cancer  Other   . Other Daughter        one daughter had TAH-BSO at 61; other daughter will have one soon      Marti Sleigh, MD 02/06/2019, 12:32 PM

## 2019-02-06 NOTE — Patient Instructions (Signed)
Follow up with Dr. Alvy Bimler.  Please call for any needs or concerns.

## 2019-02-06 NOTE — Telephone Encounter (Signed)
[name] called back and was advised of appointment on 3/17.  She verbalized agreement.

## 2019-02-06 NOTE — Progress Notes (Signed)
Foundation One testing requested with Suanne Marker in [facility] per Joylene John, NP.

## 2019-02-13 ENCOUNTER — Telehealth: Payer: Self-pay

## 2019-02-13 ENCOUNTER — Inpatient Hospital Stay: Payer: Medicare Other

## 2019-02-13 ENCOUNTER — Telehealth: Payer: Self-pay | Admitting: Hematology and Oncology

## 2019-02-13 ENCOUNTER — Inpatient Hospital Stay: Payer: Medicare Other | Admitting: Hematology and Oncology

## 2019-02-13 ENCOUNTER — Other Ambulatory Visit: Payer: Self-pay

## 2019-02-13 VITALS — BP 156/64 | HR 71 | Temp 97.3°F | Resp 18 | Ht 63.0 in | Wt 133.4 lb

## 2019-02-13 DIAGNOSIS — Z7901 Long term (current) use of anticoagulants: Secondary | ICD-10-CM

## 2019-02-13 DIAGNOSIS — E785 Hyperlipidemia, unspecified: Secondary | ICD-10-CM

## 2019-02-13 DIAGNOSIS — Z9221 Personal history of antineoplastic chemotherapy: Secondary | ICD-10-CM

## 2019-02-13 DIAGNOSIS — C772 Secondary and unspecified malignant neoplasm of intra-abdominal lymph nodes: Secondary | ICD-10-CM | POA: Diagnosis not present

## 2019-02-13 DIAGNOSIS — Z7189 Other specified counseling: Secondary | ICD-10-CM

## 2019-02-13 DIAGNOSIS — R42 Dizziness and giddiness: Secondary | ICD-10-CM

## 2019-02-13 DIAGNOSIS — C569 Malignant neoplasm of unspecified ovary: Secondary | ICD-10-CM

## 2019-02-13 DIAGNOSIS — I252 Old myocardial infarction: Secondary | ICD-10-CM

## 2019-02-13 DIAGNOSIS — D61818 Other pancytopenia: Secondary | ICD-10-CM | POA: Diagnosis not present

## 2019-02-13 DIAGNOSIS — G620 Drug-induced polyneuropathy: Secondary | ICD-10-CM | POA: Diagnosis not present

## 2019-02-13 DIAGNOSIS — Z5111 Encounter for antineoplastic chemotherapy: Secondary | ICD-10-CM | POA: Diagnosis not present

## 2019-02-13 DIAGNOSIS — E039 Hypothyroidism, unspecified: Secondary | ICD-10-CM

## 2019-02-13 DIAGNOSIS — Z79899 Other long term (current) drug therapy: Secondary | ICD-10-CM

## 2019-02-13 DIAGNOSIS — I2510 Atherosclerotic heart disease of native coronary artery without angina pectoris: Secondary | ICD-10-CM

## 2019-02-13 DIAGNOSIS — I10 Essential (primary) hypertension: Secondary | ICD-10-CM

## 2019-02-13 LAB — CBC WITH DIFFERENTIAL (CANCER CENTER ONLY)
Abs Immature Granulocytes: 0 10*3/uL (ref 0.00–0.07)
Basophils Absolute: 0 10*3/uL (ref 0.0–0.1)
Basophils Relative: 1 %
Eosinophils Absolute: 0 10*3/uL (ref 0.0–0.5)
Eosinophils Relative: 0 %
HCT: 34.8 % — ABNORMAL LOW (ref 36.0–46.0)
Hemoglobin: 11.2 g/dL — ABNORMAL LOW (ref 12.0–15.0)
IMMATURE GRANULOCYTES: 0 %
Lymphocytes Relative: 38 %
Lymphs Abs: 1.2 10*3/uL (ref 0.7–4.0)
MCH: 34.8 pg — ABNORMAL HIGH (ref 26.0–34.0)
MCHC: 32.2 g/dL (ref 30.0–36.0)
MCV: 108.1 fL — ABNORMAL HIGH (ref 80.0–100.0)
MONOS PCT: 14 %
Monocytes Absolute: 0.4 10*3/uL (ref 0.1–1.0)
NEUTROS PCT: 47 %
Neutro Abs: 1.4 10*3/uL — ABNORMAL LOW (ref 1.7–7.7)
Platelet Count: 149 10*3/uL — ABNORMAL LOW (ref 150–400)
RBC: 3.22 MIL/uL — ABNORMAL LOW (ref 3.87–5.11)
RDW: 15.9 % — ABNORMAL HIGH (ref 11.5–15.5)
WBC Count: 3 10*3/uL — ABNORMAL LOW (ref 4.0–10.5)
nRBC: 0 % (ref 0.0–0.2)

## 2019-02-13 LAB — CMP (CANCER CENTER ONLY)
ALK PHOS: 88 U/L (ref 38–126)
ALT: 13 U/L (ref 0–44)
ANION GAP: 10 (ref 5–15)
AST: 18 U/L (ref 15–41)
Albumin: 4.1 g/dL (ref 3.5–5.0)
BUN: 12 mg/dL (ref 8–23)
CO2: 27 mmol/L (ref 22–32)
Calcium: 9.4 mg/dL (ref 8.9–10.3)
Chloride: 104 mmol/L (ref 98–111)
Creatinine: 0.77 mg/dL (ref 0.44–1.00)
GFR, Est AFR Am: >60 mL/min (ref >60)
GFR, Estimated: >60 mL/min (ref >60)
Glucose, Bld: 79 mg/dL (ref 70–99)
Potassium: 4.3 mmol/L (ref 3.5–5.1)
Sodium: 141 mmol/L (ref 135–145)
Total Bilirubin: 0.4 mg/dL (ref 0.3–1.2)
Total Protein: 6.7 g/dL (ref 6.5–8.1)

## 2019-02-13 NOTE — Patient Instructions (Signed)
Doxorubicin Liposomal injection  What is this medicine?  LIPOSOMAL DOXORUBICIN (LIP oh som al dox oh ROO bi sin) is a chemotherapy drug. This medicine is used to treat many kinds of cancer like Kaposi's sarcoma, multiple myeloma, and ovarian cancer.  This medicine may be used for other purposes; ask your health care provider or pharmacist if you have questions.  COMMON BRAND NAME(S): Doxil, Lipodox  What should I tell my health care provider before I take this medicine?  They need to know if you have any of these conditions:  -blood disorders  -heart disease  -infection (especially a virus infection such as chickenpox, cold sores, or herpes)  -liver disease  -recent or ongoing radiation therapy  -an unusual or allergic reaction to doxorubicin, other chemotherapy agents, soybeans, other medicines, foods, dyes, or preservatives  -pregnant or trying to get pregnant  -breast-feeding  How should I use this medicine?  This drug is given as an infusion into a vein. It is administered in a hospital or clinic by a specially trained health care professional. If you have pain, swelling, burning or any unusual feeling around the site of your injection, tell your health care professional right away.  Talk to your pediatrician regarding the use of this medicine in children. Special care may be needed.  Overdosage: If you think you have taken too much of this medicine contact a poison control center or emergency room at once.  NOTE: This medicine is only for you. Do not share this medicine with others.  What if I miss a dose?  It is important not to miss your dose. Call your doctor or health care professional if you are unable to keep an appointment.  What may interact with this medicine?  Do not take this medicine with any of the following medications:  -zidovudine  This medicine may also interact with the following medications:  -medicines to increase blood counts like filgrastim, pegfilgrastim, sargramostim  -vaccines  Talk to  your doctor or health care professional before taking any of these medicines:  -acetaminophen  -aspirin  -ibuprofen  -ketoprofen  -naproxen  This list may not describe all possible interactions. Give your health care provider a list of all the medicines, herbs, non-prescription drugs, or dietary supplements you use. Also tell them if you smoke, drink alcohol, or use illegal drugs. Some items may interact with your medicine.  What should I watch for while using this medicine?  Your condition will be monitored carefully while you are receiving this medicine. You may need blood work done while you are taking this medicine.  This drug may make you feel generally unwell. This is not uncommon, as chemotherapy can affect healthy cells as well as cancer cells. Report any side effects. Continue your course of treatment even though you feel ill unless your doctor tells you to stop.  Your urine may turn orange-red for a few days after your dose. This is not blood. If your urine is dark or brown, call your doctor.  In some cases, you may be given additional medicines to help with side effects. Follow all directions for their use.  Talk to your doctor about your risk of cancer. You may be more at risk for certain types of cancers if you take this medicine.  Do not become pregnant while taking this medicine or for 6 months after stopping it. Women should inform their healthcare professional if they wish to become pregnant or think they may be pregnant. Men should not father   a child while taking this medicine and for 6 months after stopping it. There is a potential for serious side effects to an unborn child. Talk to your health care professional or pharmacist for more information. Do not breast-feed an infant while taking this medicine.  This medicine has caused ovarian failure in some women. This medicine may make it more difficult to get pregnant. Talk to your healthcare professional if you are concerned about your  fertility.  This medicine has caused decreased sperm counts in some men. This may make it more difficult to father a child. Talk to your healthcare professional if you are concerned about your fertility.  This medicine may cause a decrease in Co-Enzyme Q-10. You should make sure that you get enough Co-Enzyme Q-10 while you are taking this medicine. Discuss the foods you eat and the vitamins you take with your health care professional.  What side effects may I notice from receiving this medicine?  Side effects that you should report to your doctor or health care professional as soon as possible:  -allergic reactions like skin rash, itching or hives, swelling of the face, lips, or tongue  -low blood counts - this medicine may decrease the number of white blood cells, red blood cells and platelets. You may be at increased risk for infections and bleeding.  -signs of hand-foot syndrome - tingling or burning, redness, flaking, swelling, small blisters, or small sores on the palms of your hands or the soles of your feet  -signs of infection - fever or chills, cough, sore throat, pain or difficulty passing urine  -signs of decreased platelets or bleeding - bruising, pinpoint red spots on the skin, black, tarry stools, blood in the urine  -signs of decreased red blood cells - unusually weak or tired, fainting spells, lightheadedness  -back pain, chills, facial flushing, fever, headache, tightness in the chest or throat during the infusion  -breathing problems  -chest pain  -fast, irregular heartbeat  -mouth pain, redness, sores  -pain, swelling, redness at site where injected  -pain, tingling, numbness in the hands or feet  -swelling of ankles, feet, or hands  -vomiting  Side effects that usually do not require medical attention (report to your doctor or health care professional if they continue or are bothersome):  -diarrhea  -hair loss  -loss of appetite  -nail discoloration or damage  -nausea  -red or watery eyes  -red  colored urine  -stomach upset  This list may not describe all possible side effects. Call your doctor for medical advice about side effects. You may report side effects to FDA at 1-800-FDA-1088.  Where should I keep my medicine?  This drug is given in a hospital or clinic and will not be stored at home.  NOTE: This sheet is a summary. It may not cover all possible information. If you have questions about this medicine, talk to your doctor, pharmacist, or health care provider.   2019 Elsevier/Gold Standard (2018-07-24 15:13:26)

## 2019-02-13 NOTE — Telephone Encounter (Signed)
Gave avs and calendar ° °

## 2019-02-13 NOTE — Telephone Encounter (Signed)
Spoke with pt by phone to give appt date, time for ECHO

## 2019-02-13 NOTE — Progress Notes (Signed)
DISCONTINUE OFF PATHWAY REGIMEN - Ovarian   [id number]:Carboplatin + Paclitaxel (5/175) q21 Days:   A cycle is every 21 days:     Paclitaxel      Carboplatin   **Always confirm dose/schedule in your pharmacy ordering system**  REASON: Disease Progression PRIOR TREATMENT: Off Pathway: Carboplatin + Paclitaxel (5/175) q21 Days TREATMENT RESPONSE: Progressive Disease (PD)  START ON PATHWAY REGIMEN - Ovarian     A cycle is every 28 days:     Liposomal doxorubicin   **Always confirm dose/schedule in your pharmacy ordering system**  Patient Characteristics: Recurrent or Progressive Disease, Fourth Line and Beyond, BRCA Mutation Absent/Unknown Therapeutic Status: Recurrent or Progressive Disease BRCA Mutation Status: Absent Line of Therapy: Fourth Line and Beyond  Intent of Therapy: Non-Curative / Palliative Intent, Discussed with Patient

## 2019-02-14 ENCOUNTER — Encounter: Payer: Self-pay | Admitting: Hematology and Oncology

## 2019-02-14 ENCOUNTER — Telehealth: Payer: Self-pay

## 2019-02-14 NOTE — Telephone Encounter (Signed)
Called and given below message. She verbalized understanding. 

## 2019-02-14 NOTE — Assessment & Plan Note (Addendum)
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 She does not need prophylactic G-CSF support

## 2019-02-14 NOTE — Progress Notes (Signed)
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[place]) I have reviewed the plan of care again with the patient in the hospital We discussed the risk, benefits, side effects of liposomal doxorubicin versus gemcitabine Ultimately, she chose to undergo treatment with liposomal doxorubicin  The decision was made based on publication below and supported by NCCN guidelines  Phase III Trial of Gemcitabine Compared With Pegylated Liposomal Doxorubicin in Progressive or Recurrent Ovarian Cancer Emilia Beck, Burlene Arnt Ludovisi, Domenica Lorusso, [place], Murvin Donning, [place], [place], and [place]  We aimed at investigating the efficacy, tolerability, and quality of life (QOL) of gemcitabine (GEM) compared with pegylated liposomal doxorubicin (PLD) in the salvage treatment of recurrent ovarian cancer. Patients and Methods A phase III randomized multicenter trial was planned to compare GEM (1,000 mg/m2 on days 1, 8, and 15 every 28 days) with PLD (40 mg/m2 every 28 days) in ovarian cancer patients who experienced treatment failure with only one platinum/paclitaxel regimen and who experienced recurrence or progression within 12 months after completion of primary treatment. Results One hundred fifty-three patients were randomly assigned to PLD (n  94) or GEM (n  77). Treatment arms were well balanced for clinicopathologic characteristics. Grade 3 or 4 neutropenia was more frequent in GEM-treated patients versus PLD-treated patients (P  .007). Grade 3 or 4 palmar-plantar erythrodysesthesia was documented in a higher proportion of PLD patients (6%) versus GEM patients (0%; P  .061). The overall response rate was 16% in the PLD arm compared with 29% in the GEM arm (P  .056). No statistically  significant difference in time to progression (TTP) curves according to treatment allocation was documented (P  .411). However, a trend for more favorable overall survival was documented in the PLD arm compared with the GEM arm, although the P value was of borderline statistical significance (P  .048). Statistically significantly higher global QOL scores were found in PLD-treated patients at the first and second postbaseline QOL assessments. Conclusion GEM does not provide an advantage compared with PLD in terms of TTP in ovarian cancer patients who experience recurrence within 12 months after primary treatment but should be considered in the spectrum of drugs to be possibly used in the salvage setting. Tora Perches [id number].  2008 by American Society of Clinical Oncology  We discussed some of the risks, benefits and side-effects of  Doxil   Some of the short term side-effects included, though not limited to, risk of fatigue, weight loss, tumor lysis syndrome, risk of allergic reactions, pancytopenia, life-threatening infections, need for transfusions of blood products, nausea, vomiting, change in bowel habits, hair loss, risk of congestive heart failure, admission to hospital for various reasons, and risks of death.   Long term side-effects are also discussed including permanent damage to nerve function, chronic fatigue, and rare secondary malignancy including bone marrow disorders.   The patient is aware that the response rates discussed earlier is not guaranteed.    After a long discussion, patient made an informed decision to proceed with the prescribed plan of care.   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 She does not need prophylactic G-CSF support  Goals of care, counseling/discussion The patient is aware she has incurable disease and treatment is strictly palliative. We discussed importance of Advanced Directives and Living will. She has advanced directives and living will at home.  Her husband, Levada Dy is the medical healthcare power of attorney   Orders Placed This Encounter  Procedures  . ECHOCARDIOGRAM COMPLETE    Standing Status:   Future    Standing Expiration Date:   05/15/2020    Order Specific Question:   Where should this test be performed    Answer:   [facility]    Order Specific Question:   Perflutren DEFINITY (image enhancing agent) should be administered unless hypersensitivity or allergy exist    Answer:   Administer Perflutren    Order Specific Question:   Reason for exam-Echo    Answer:   Chemo  V67.2 / Z09    INTERVAL HISTORY: Please see below for problem oriented charting. She returns for further follow-up with her husband for further plan of care decision making She was seen by GYN oncologist last week She has minimum symptoms at present time No nausea, changes in bowel habits or bloating She denies recent exacerbation of congestive heart failure such as symptoms of shortness of breath, chest pain or leg swelling She has intermittent vertigo.  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  Surgical consultation is advised.  No findings to suggest metastatic disease in the abdomen/pelvis    02/13/2013 Pathology Results    1. Adnexa - ovary +/- tube, neoplastic - HIGH GRADE OVARIAN POORLY DIFFERENTIATED CARCINOMA WITH ASSOCIATED TUMOR NECROSIS, 11.5 CM, INVOLVING THE OVARIAN CAPSULE AND ADJACENT FALLOPIAN TUBE. - PLEASE SEE ONCOLOGY TEMPLATE FOR DETAILS. 2. Ovary and fallopian tube, left - HIGH GRADE OVARIAN POORLY DIFFERENTIATED CARCINOMA WITH ASSOCIATED TUMOR NECROSIS, 2.3 CM, INVOLVING THE OVARIAN CAPSULE. - FALLOPIAN TUBAL TISSUE, NO EVIDENCE OF MALIGNANCY. - PLEASE SEE ONCOLOGY TEMPLATE FOR DETAILS. 3. Uterus and cervix - ENDOMETRIUM: BENIGN WEAKLY PROLIFERATIVE ENDOMETRIUM, NO ATYPIA, HYPERPLASIA OR MALIGNANCY. - CERVIX: BENIGN SQUAMOUS MUCOSA AND ENDOCERVICAL MUCOSA, NO DYSPLASIA OR MALIGNANCY. - UTERINE SEROSA: ADHESIONS, NO EVIDENCE OF ENDOMETRIOSIS, ATYPIA OR MALIGNANCY. 4. Cul-de-sac biopsy - POSITIVE FOR METASTATIC CARCINOMA (2.0 CM). 5. Soft tissue mass, simple excision, peri rectal - POSITIVE FOR METASTATIC CARCINOMA (2.6 CM). 6. Lymph nodes, regional resection, right pelvic - SEVEN LYMPH NODES, NEGATIVE FOR METASTATIC CARCINOMA (0/7). 7. Omentum, resection for tumor - MATURE ADIPOSE TISSUE, NO EVIDENCE OF MALIGNANCY. Microscopic Comment 1. OVARY Specimen(s): Right and left ovaries and fallopian tubes, uterus and cervix Procedure(s): Total hysterectomy, bilateral salpingo-oophorectomy Lymph  node sampling performed: Yes Primary tumor site: Right and left ovaries Ovarian surface involvement: Present Maximum tumor size (cm): Right ovary: 11.5 cm; left ovary:  2.3 cm, gross measurement. 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 please see comment. Grade: High grade Peritoneal implants: No Pelvic extension: Cul-de-sac biopsy and perirectal soft tissue biopsy: positive Peritoneal washings: Rare cluster of atypical cells present ([id number]) Lymph nodes: number examined 7 ; number positive 0 TNM code: pT2c, pN0 FIGO Stage (based on pathologic findings, needs clinical correlation): II B (2014 edition) Comments: Sections of bilateral ovaries show sheets and nests of malignant epithelium resembling transitional cell carcinoma with associated tumor necrosis. No evidence of Brenner's tumor or conventional endometrioid carcinoma is identified in the background. The bilateral ovarian capsules are involved. Immunohistochemical stains were performed and the malignant cells are strongly positive for cytokeratin 7, WT-1, p16 and p53, negative for cytokeratin 20. 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 The tumor involves the right fallopian tube. Both cul-de-sac and perirectal soft tissue biopsies are positive for metastatic carcinoma. The omentum biopsy is negative for tumor.     03/20/2013 - 08/14/2013 Chemotherapy    The patient had 6 cycles of carboplatin and taxol    09/25/2013 Imaging    1. Interval resection of previously described right pelvic mass. No evidence of recurrent or metastatic disease. 2. Small hiatal hernia. 3. Moderate amount of stool within the rectum. Question constipation or even mild fecal impaction.    04/03/2015 PET scan    1. Highly hypermetabolic height bowed dense mass in the medial spleen compatible with malignancy. 2. Bilateral pelvic sidewall adenopathy, right greater than left, hypermetabolic, with small peritoneal implants of tumor in the lower pelvis, and with  several faint but suspected omental implants of tumor. There is also a hypermetabolic aortocaval lymph node in the retroperitoneum. 3. Generalized increased thyroid activity could be due to thyroiditis. Although a well-defined nodule is not seen in the right thyroid lobe, there is focal increased hypermetabolic activity in the right thyroid lobe. Thyroid ultrasound is recommended for further investigation. 4. I suspect that the high activity between the spinous processes of L3 and L4 is degenerative.    05/01/2015 - 10/21/2015 Chemotherapy    The patient had 8 cycles of carboplatin, taxol and Avastin.      07/02/2015 Imaging    1. Decrease in size of these splenic lesion. Suspect tumor also involving the adjacent posterior wall of the stomach. 2. Improved pelvic lymphadenopathy. 3. No new omental or peritoneal surface disease.     09/10/2015 Imaging    1. Response to therapy, with further decreased size of a medial splenic lesion. The previously questioned extension into the stomach is no longer identified and may have been artifactual on the prior exam. Of note, an inferior splenic low-density capsular based lesion is felt to be similar and warrants followup attention. 2. No adenopathy or new peritoneal disease identified. 3.  Possible constipation. 4.  Advanced aortic and branch vessel atherosclerosis.    11/06/2015 Imaging    1. Mass arising from the medial spleen and involving the posterior wall of stomach demonstrates mild decrease in size in the interval. 2. Interval improvement and previously referenced pathologic iliac lymph nodes. 3. Previously referenced peritoneal implants within the pelvis are no longer measurable. No new areas of peritoneal metastasis is identified. 4. There is a right hilar node which is  borderline enlarged but is increased in size when compared with 04/19/2013. Attention on follow-up examination is recommended.    11/18/2015 PET scan    1. Residual  hypermetabolic tumor implant between the stomach and spleen, significantly decreased in size and mildly decreased in metabolism compared to 04/03/2015 PET-CT, and slightly decreased in size since 11/06/2015. 2. Residual hypermetabolic right deep pelvic side wall lymph node, with partial treatment response compared to 04/03/2015 PET-CT. 3. No new sites of hypermetabolic metastatic disease. 4. Stable diffuse thyroid hypermetabolism without discrete thyroid nodule, most suggestive of diffuse thyroiditis. Recommend correlation with serum thyroid function tests.     01/28/2016 - 11/26/2016 Anti-estrogen oral therapy    She was placed on Letrozole, stopped due to poor tolerance    02/09/2016 Imaging    1. Continued decrease in size of peritoneal lesion between the posterior wall of stomach and spleen. 2. No new or progressive disease identified    06/23/2016 Imaging    1. Near complete resolution of subtle residual soft tissue density lesion in the gastrosplenic ligament. 2. No new sites of disease identified. 3. Stool burden suggests constipation or even fecal impaction    12/01/2016 Imaging    1. No CT findings for residual or recurrent abdominal/pelvic ovarian cancer. 2. No acute abdominal/pelvic findings. 3. Large amount of stool throughout the colon and down into the rectum suggesting constipation. 4. Stable atherosclerotic calcifications involving the aorta and iliac arteries.    03/29/2017 - 10/29/2017 Anti-estrogen oral therapy    She was on Aromasin    04/19/2017 Imaging    1. Suspicion of peritoneal recurrence with enlarging implants in the gastrosplenic ligament, left adnexa and posterior to the cecum. No generalized ascites, adenopathy or definite solid organ disease. 2. No evidence of bowel or ureteral obstruction. 3.  Aortic Atherosclerosis (ICD10-I70.0).    06/25/2017 Genetic Testing    Patient has genetic testing done for Breast/ovarian cancer panel Results revealed patient has no  detectable mutation    09/01/2017 Tumor Marker    Patient's tumor was tested for the following markers: CA-125 Results of the tumor marker test revealed 40.9    10/29/2017 - 03/24/2018 Anti-estrogen oral therapy    She was on tamoxifen    11/05/2017 Tumor Marker    Patient's tumor was tested for the following markers: CA-125 Results of the tumor marker test revealed 84.4    11/15/2017 Imaging    1. Mild progression of peritoneal metastasis. 2. Possible constipation. No obstruction or other acute complication. 3.  Aortic Atherosclerosis (ICD10-I70.0).    03/21/2018 Tumor Marker    Patient's tumor was tested for the following markers: CA-125 Results of the tumor marker test revealed 101.3.    04/03/2018 Tumor Marker    Patient's tumor was tested for the following markers: CA-125 Results of the tumor marker test revealed 120.9    04/10/2018 - 07/17/2018 Chemotherapy    The patient had single agent carboplatin x 5 cycles    05/02/2018 Tumor Marker    Patient's tumor was tested for the following markers: CA-125 Results of the tumor marker test revealed 68.2    05/29/2018 Tumor Marker    Patient's tumor was tested for the following markers: CA-125 Results of the tumor marker test revealed 41.7     Genetic Testing    Patient has genetic testing done for HRD. Results revealed: negative for deleterious BRCA1 or BRCA2 mutations in tumor.     06/26/2018 Tumor Marker    Patient's tumor was tested for the following  markers: CA-125 Results of the tumor marker test revealed 29.5    07/14/2018 Imaging    Interval decrease in size of bilateral adnexal masses.  No significant change in size of peritoneal metastasis in the left upper quadrant.  No new or progressive metastatic disease identified.    08/07/2018 - 11/07/2018 Chemotherapy    The patient had zejula    08/14/2018 Tumor Marker    Patient's tumor was tested for the following markers: CA-125 Results of the tumor marker test revealed  31.7    10/16/2018 Tumor Marker    Patient's tumor was tested for the following markers: CA-125 Results of the tumor marker test revealed 49    11/06/2018 Imaging    1. Mild to moderate enlargement of the left upper quadrant lesion along the gastric wall and pancreatic tail suspicious for malignancy. Subtle invasion of the capsule of the spleen as before. 2. Stable soft tissue nodularity along the right adnexa. Slightly reduced soft tissue prominence along the left adnexa. By report the patient has had bilateral salpingo oophorectomy and accordingly these adnexal lesions are concerning for tumor. 3. No new areas of tumor nodularity are identified. 4. Other imaging findings of potential clinical significance: Pelvic floor laxity with small cystocele. Prominent stool throughout the colon favors constipation. Upper normal sized left periaortic lymph node. Bibasilar cylindrical bronchiectasis. Aortic Atherosclerosis (ICD10-I70.0). Coronary atherosclerosis.    11/06/2018 Tumor Marker    Patient's tumor was tested for the following markers: CA-125 Results of the tumor marker test revealed 61.5    11/25/2018 - 01/08/2019 Chemotherapy    The patient had carboplatin and taxol    12/18/2018 Tumor Marker    Patient's tumor was tested for the following markers: CA-125 Results of the tumor marker test revealed 55.3    01/26/2019 Imaging    3.2 x 4.2 cm low-density lesion along the posterior aspect of the gastric cardia, abutting the splenic hilum and pancreatic tail, suspicious for metastasis. This is stable versus mildly decreased.  2.9 x 2.8 cm mass along the posterior aspect of the cecum (previously described as right adnexal on prior CT), favoring cecal/appendiceal neoplasm, grossly unchanged.  2.6 cm left pelvic/adnexal implant, suspicious for metastasis, mildly increased.  11 mm short axis left para-aortic nodal metastasis, mildly increased.  Low-density splenic lesions along the posterior  spleen, new, indeterminate.    01/26/2019 Tumor Marker    Patient's tumor was tested for the following markers: CA-125 Results of the tumor marker test revealed 41.4    02/19/2019 -  Chemotherapy    The patient had DOXOrubicin HCL LIPOSOMAL (DOXIL) 50 mg in dextrose 5 % 250 mL chemo infusion, 30 mg/m2 = 50 mg (75 % of original dose 40 mg/m2), Intravenous,  Once, 0 of 4 cycles Dose modification: 30 mg/m2 (75 % of original dose 40 mg/m2, Cycle 1, Reason: Dose Not Tolerated, Comment: baseline pancytopenia)  for chemotherapy treatment.      REVIEW OF SYSTEMS:   Constitutional: Denies fevers, chills or abnormal weight loss Eyes: Denies blurriness of vision Ears, nose, mouth, throat, and face: Denies mucositis or sore throat Respiratory: Denies cough, dyspnea or wheezes Cardiovascular: Denies palpitation, chest discomfort or lower extremity swelling Gastrointestinal:  Denies nausea, heartburn or change in bowel habits Skin: Denies abnormal skin rashes Lymphatics: Denies new lymphadenopathy or easy bruising Neurological:Denies numbness, tingling or new weaknesses Behavioral/Psych: Mood is stable, no new changes  All other systems were reviewed with the patient and are negative.  I have reviewed the past medical history,  past surgical history, social history and family history with the patient and they are unchanged from previous note.  ALLERGIES:  is allergic to clonidine derivatives; coreg [carvedilol]; caffeine; codeine; flexeril [cyclobenzaprine]; lipitor [atorvastatin]; lisinopril; pravastatin; tape; tegaderm ag mesh [silver]; and ultram [tramadol].  MEDICATIONS:  Current Outpatient Medications  Medication Sig Dispense Refill  . acetaminophen (TYLENOL) 500 MG tablet Take 1,000 mg by mouth every 6 (six) hours as needed for mild pain.     Marland Kitchen alendronate (FOSAMAX) 70 MG tablet Take 70 mg by mouth once a week. Take with a full glass of water on an empty stomach.    Marland Kitchen amiodarone (PACERONE)  200 MG tablet Take 100 mg by mouth daily.  3  . amiodarone (PACERONE) 200 MG tablet TAKE 1/2 TABLET BY MOUTH DAILY 45 tablet 3  . amoxicillin (AMOXIL) 500 MG capsule Take 2,000 mg by mouth See admin instructions. Takes 4 capsules 1 hour prior to procedure    . Biotin 10 MG TABS Take 20 mg by mouth daily.    Marland Kitchen desonide (DESOWEN) 0.05 % cream Apply 1 application topically 2 (two) times daily.   1  . diphenhydrAMINE (BENADRYL) 25 MG tablet Take 50 mg by mouth at bedtime as needed for sleep.     Marland Kitchen docusate sodium (COLACE) 100 MG capsule Take 250 mg by mouth 2 (two) times daily.     Marland Kitchen ELIQUIS 5 MG TABS tablet TAKE 1 TABLET BY MOUTH TWICE A DAY 60 tablet 4  . Evolocumab with Infusor 420 MG/3.5ML SOCT Inject into the skin.    Marland Kitchen ezetimibe (ZETIA) 10 MG tablet Take 1 tablet (10 mg total) by mouth daily. (Patient taking differently: Take 10 mg by mouth at bedtime. ) 30 tablet 6  . Glucosamine HCl 1000 MG TABS Take 1,000 mg by mouth daily.    . hydrocortisone 1 % ointment Apply 1 application topically 2 (two) times daily as needed for itching (eczema).     Marland Kitchen ipratropium (ATROVENT) 0.06 % nasal spray Place 2 sprays into both nostrils daily.   5  . levothyroxine (SYNTHROID) 88 MCG tablet Take 88 mcg by mouth daily before breakfast.    . lidocaine-prilocaine (EMLA) cream Apply to Porta-Cath site 1-2 hours prior to access as directed. 30 g 1  . linaclotide (LINZESS) 145 MCG CAPS capsule Take 145 mcg by mouth daily before breakfast.    . magnesium hydroxide (MILK OF MAGNESIA) 800 MG/5ML suspension Take 30 mLs by mouth daily as needed for constipation. Reported on 11/20/2015    . Melatonin 5 MG CAPS Take 5 mg by mouth at bedtime as needed (sleep). Reported on 12/25/2015    . Menthol, Topical Analgesic, (ICY HOT EX) Apply 1 application topically 2 (two) times daily as needed (PAIN).     . Multiple Vitamin (MULTIVITAMIN WITH MINERALS) TABS tablet Take 1 tablet by mouth daily. Centrum Silver    . ondansetron (ZOFRAN)  8 MG tablet TAKE 1 TABLET (8 MG TOTAL) BY MOUTH EVERY 8 (EIGHT) HOURS AS NEEDED FOR NAUSEA OR VOMITING. 90 tablet 1  . OVER THE COUNTER MEDICATION Co210 20 mg once a day for muscle aches.    Vladimir Faster Glycol-Propyl Glycol (SYSTANE OP) Place 1 drop into both eyes 2 (two) times daily.     . polyethylene glycol (MIRALAX / GLYCOLAX) packet Take 17 g by mouth daily. Mix in 8 oz liquid and drink    . potassium chloride (K-DUR) 10 MEQ tablet Take 10 mEq by mouth daily.    Marland Kitchen  prochlorperazine (COMPAZINE) 10 MG tablet Take 1 tablet (10 mg total) by mouth every 6 (six) hours as needed (Nausea or vomiting). 30 tablet 1  . SHINGRIX injection      No current facility-administered medications for this visit.    Facility-Administered Medications Ordered in Other Visits  Medication Dose Route Frequency Provider Last Rate Last Dose  . sodium chloride flush (NS) 0.9 % injection 10 mL  10 mL Intravenous PRN Joylene John D, NP   10 mL at 01/06/18 1220    PHYSICAL EXAMINATION: ECOG PERFORMANCE STATUS: 1 - Symptomatic but completely ambulatory  Vitals:   02/13/19 1055  BP: (!) 156/64  Pulse: 71  Resp: 18  Temp: (!) 97.3 F (36.3 C)  SpO2: 100%   Filed Weights   02/13/19 1055  Weight: 133 lb 6.4 oz (60.5 kg)    GENERAL:alert, no distress and comfortable Musculoskeletal:no cyanosis of digits and no clubbing  NEURO: alert & oriented x 3 with fluent speech, no focal motor/sensory deficits  LABORATORY DATA:  I have reviewed the data as listed    Component Value Date/Time   NA 141 02/13/2019 1143   NA 141 11/11/2017 1145   K 4.3 02/13/2019 1143   K 4.0 11/11/2017 1145   CL 104 02/13/2019 1143   CL 103 05/21/2013 1105   CO2 27 02/13/2019 1143   CO2 29 11/11/2017 1145   GLUCOSE 79 02/13/2019 1143   GLUCOSE 81 11/11/2017 1145   GLUCOSE 74 05/21/2013 1105   BUN 12 02/13/2019 1143   BUN 10.9 11/11/2017 1145   CREATININE 0.77 02/13/2019 1143   CREATININE 0.7 11/11/2017 1145   CALCIUM 9.4  02/13/2019 1143   CALCIUM 9.6 11/11/2017 1145   PROT 6.7 02/13/2019 1143   PROT 6.5 11/24/2016 1104   ALBUMIN 4.1 02/13/2019 1143   ALBUMIN 3.9 11/24/2016 1104   AST 18 02/13/2019 1143   AST 18 11/24/2016 1104   ALT 13 02/13/2019 1143   ALT 15 11/24/2016 1104   ALKPHOS 88 02/13/2019 1143   ALKPHOS 104 11/24/2016 1104   BILITOT 0.4 02/13/2019 1143   BILITOT 0.57 11/24/2016 1104   GFRNONAA >60 02/13/2019 1143   GFRAA >60 02/13/2019 1143    No results found for: SPEP, UPEP  Lab Results  Component Value Date   WBC 3.0 (L) 02/13/2019   NEUTROABS 1.4 (L) 02/13/2019   HGB 11.2 (L) 02/13/2019   HCT 34.8 (L) 02/13/2019   MCV 108.1 (H) 02/13/2019   PLT 149 (L) 02/13/2019      Chemistry      Component Value Date/Time   NA 141 02/13/2019 1143   NA 141 11/11/2017 1145   K 4.3 02/13/2019 1143   K 4.0 11/11/2017 1145   CL 104 02/13/2019 1143   CL 103 05/21/2013 1105   CO2 27 02/13/2019 1143   CO2 29 11/11/2017 1145   BUN 12 02/13/2019 1143   BUN 10.9 11/11/2017 1145   CREATININE 0.77 02/13/2019 1143   CREATININE 0.7 11/11/2017 1145      Component Value Date/Time   CALCIUM 9.4 02/13/2019 1143   CALCIUM 9.6 11/11/2017 1145   ALKPHOS 88 02/13/2019 1143   ALKPHOS 104 11/24/2016 1104   AST 18 02/13/2019 1143   AST 18 11/24/2016 1104   ALT 13 02/13/2019 1143   ALT 15 11/24/2016 1104   BILITOT 0.4 02/13/2019 1143   BILITOT 0.57 11/24/2016 1104       RADIOGRAPHIC STUDIES: I have personally reviewed the radiological images as listed and  agreed with the findings in the report. Ct Abdomen Pelvis W Contrast  Result Date: 01/26/2019 CLINICAL DATA:  Recurrent ovarian cancer EXAM: CT ABDOMEN AND PELVIS WITH CONTRAST TECHNIQUE: Multidetector CT imaging of the abdomen and pelvis was performed using the standard protocol following bolus administration of intravenous contrast. CONTRAST:  160m OMNIPAQUE IOHEXOL 300 MG/ML  SOLN COMPARISON:  11/06/2018 FINDINGS: Lower chest: Lung bases  are clear. Hepatobiliary: Liver is within normal limits. Gallbladder unremarkable. No intrahepatic or extrahepatic ductal dilatation. Pancreas: Grossly unremarkable, noting a left upper quadrant lesion (described below) which abuts the pancreatic tail. Spleen: Low-density splenic lesions along the posterior spleen (series 2/image 16). Left upper quadrant lesion (described below) abuts the splenic hilum. Adrenals/Urinary Tract: Adrenal glands are within normal limits. Subcentimeter right renal cyst. Left kidney is within normal limits. No hydronephrosis. Bladder is low-lying but unremarkable. Stomach/Bowel: 3.2 x 4.2 cm low-density lesion along the posterior aspect of the gastric cardia (series 2/image 18), previously 3.2 x 5.0 cm, stable versus mildly decreased. 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 No evidence of bowel obstruction. Moderate colonic stool burden, suggesting constipation. Vascular/Lymphatic: No evidence of abdominal aortic aneurysm. Atherosclerotic calcifications of the abdominal aorta and branch vessels. 11 mm short axis left para-aortic node (series 2/image 26), previously 9 mm. Reproductive: Status post hysterectomy and bilateral salpingo-oophorectomy. 2.6 x 2.4 cm left pelvic/adnexal implant (series 2/image 55), previously 2.1 x 2.1 cm. Other: No abdominopelvic ascites. Musculoskeletal: Degenerative changes of the visualized thoracolumbar spine. Right hip arthroplasty, without evidence of complication. IMPRESSION: 3.2 x 4.2 cm low-density lesion along the posterior aspect of the gastric cardia, abutting the splenic hilum and pancreatic tail, suspicious for metastasis. This is stable versus mildly decreased. 2.9 x 2.8 cm mass along the posterior aspect of the cecum (previously described as right adnexal on prior CT), favoring  cecal/appendiceal neoplasm, grossly unchanged. 2.6 cm left pelvic/adnexal implant, suspicious for metastasis, mildly increased. 11 mm short axis left para-aortic nodal metastasis, mildly increased. Low-density splenic lesions along the posterior spleen, new, indeterminate. Electronically Signed   By: SJulian HyM.D.   On: 01/26/2019 12:07    All questions were answered. The patient knows to call the clinic with any problems, questions or concerns. No barriers to learning was detected.  I spent 40 minutes counseling the patient face to face. The total time spent in the appointment was 55 minutes and more than 50% was on counseling and review of test results  NHeath Lark MD 02/14/2019 7:41 AM

## 2019-02-14 NOTE — Assessment & Plan Note (Signed)
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 We discussed importance of Advanced Directives and Living will. She has advanced directives and living will at home.  Her husband, Levada Dy is the medical healthcare power of attorney

## 2019-02-14 NOTE — Assessment & Plan Note (Signed)
I have reviewed the plan of care again with the patient in the hospital We discussed the risk, benefits, side effects of liposomal doxorubicin versus gemcitabine Ultimately, she chose to undergo treatment with liposomal doxorubicin  The decision was made based on publication below and supported by NCCN guidelines  Phase III Trial of Gemcitabine Compared With Pegylated Liposomal Doxorubicin in Progressive or Recurrent Ovarian Cancer Emilia Beck, [place], [place], [place], [place], [place], [place], and [place]  We aimed at investigating the efficacy, tolerability, and quality of life (QOL) of gemcitabine (GEM) compared with pegylated liposomal doxorubicin (PLD) in the salvage treatment of recurrent ovarian cancer. Patients and Methods A phase III randomized multicenter trial was planned to compare GEM (1,000 mg/m2 on days 1, 8, and 15 every 28 days) with PLD (40 mg/m2 every 28 days) in ovarian cancer patients who experienced treatment failure with only one platinum/paclitaxel regimen and who experienced recurrence or progression within 12 months after completion of primary treatment. Results One hundred fifty-three patients were randomly assigned to PLD (n  3) or GEM (n  77). Treatment arms were well balanced for clinicopathologic characteristics. Grade 3 or 4 neutropenia was more frequent in GEM-treated patients versus PLD-treated patients (P  .007). Grade 3 or 4 palmar-plantar erythrodysesthesia was documented in a higher proportion of PLD patients (6%) versus GEM patients (0%; P  .061). The overall response rate was 16% in the PLD arm compared with 29% in the GEM arm (P  .056). No statistically significant difference in time to progression (TTP) curves according to treatment allocation was documented (P  .411). However, a trend for more favorable overall survival was  documented in the PLD arm compared with the GEM arm, although the P value was of borderline statistical significance (P  .048). Statistically significantly higher global QOL scores were found in PLD-treated patients at the first and second postbaseline QOL assessments. Conclusion GEM does not provide an advantage compared with PLD in terms of TTP in ovarian cancer patients who experience recurrence within 12 months after primary treatment but should be considered in the spectrum of drugs to be possibly used in the salvage setting. Tora Perches 85:631-497.  2008 by American Society of Clinical Oncology  We discussed some of the risks, benefits and side-effects of  Doxil   Some of the short term side-effects included, though not limited to, risk of fatigue, weight loss, tumor lysis syndrome, risk of allergic reactions, pancytopenia, life-threatening infections, need for transfusions of blood products, nausea, vomiting, change in bowel habits, hair loss, risk of congestive heart failure, admission to hospital for various reasons, and risks of death.   Long term side-effects are also discussed including permanent damage to nerve function, chronic fatigue, and rare secondary malignancy including bone marrow disorders.   The patient is aware that the response rates discussed earlier is not guaranteed.    After a long discussion, patient made an informed decision to proceed with the prescribed plan of care.   Patient education material was dispensed  She will need baseline echocardiogram and blood work before we proceed with treatment Given her chronic pancytopenia, she will proceed with reduced dose at 30 mg per metered square

## 2019-02-14 NOTE — Telephone Encounter (Signed)
-----   Message from Heath Lark, MD sent at 02/14/2019  7:42 AM EDT ----- Regarding: labs We just had labs yesterday Ok to cancel labs and flush on 3/23. Proceed straight to chemo

## 2019-02-15 ENCOUNTER — Encounter: Payer: Self-pay | Admitting: Pharmacist

## 2019-02-16 ENCOUNTER — Other Ambulatory Visit: Payer: Self-pay

## 2019-02-16 ENCOUNTER — Other Ambulatory Visit: Payer: Self-pay | Admitting: Cardiology

## 2019-02-16 ENCOUNTER — Ambulatory Visit (HOSPITAL_COMMUNITY)
Admission: RE | Admit: 2019-02-16 | Discharge: 2019-02-16 | Disposition: A | Discharge status: Home / Self Care | Payer: Medicare Other | Source: Ambulatory Visit | Attending: Hematology and Oncology | Admitting: Hematology and Oncology

## 2019-02-16 DIAGNOSIS — C569 Malignant neoplasm of unspecified ovary: Secondary | ICD-10-CM

## 2019-02-16 DIAGNOSIS — I2510 Atherosclerotic heart disease of native coronary artery without angina pectoris: Secondary | ICD-10-CM | POA: Diagnosis not present

## 2019-02-16 NOTE — Progress Notes (Signed)
  Echocardiogram 2D Echocardiogram has been performed.  Darlina Sicilian M 02/16/2019, 11:33 AM

## 2019-02-19 ENCOUNTER — Other Ambulatory Visit: Payer: Self-pay

## 2019-02-19 ENCOUNTER — Ambulatory Visit: Payer: Medicare Other | Admitting: Hematology and Oncology

## 2019-02-19 ENCOUNTER — Other Ambulatory Visit: Payer: Medicare Other

## 2019-02-19 ENCOUNTER — Telehealth: Payer: Self-pay | Admitting: Cardiology

## 2019-02-19 ENCOUNTER — Telehealth: Payer: Self-pay

## 2019-02-19 ENCOUNTER — Encounter (HOSPITAL_COMMUNITY): Payer: Self-pay | Admitting: Gynecology

## 2019-02-19 ENCOUNTER — Inpatient Hospital Stay: Payer: Medicare Other

## 2019-02-19 VITALS — BP 154/75 | HR 67 | Temp 98.5°F | Resp 20 | Wt 130.8 lb

## 2019-02-19 DIAGNOSIS — C569 Malignant neoplasm of unspecified ovary: Secondary | ICD-10-CM

## 2019-02-19 DIAGNOSIS — Z5111 Encounter for antineoplastic chemotherapy: Secondary | ICD-10-CM | POA: Diagnosis not present

## 2019-02-19 DIAGNOSIS — Z7189 Other specified counseling: Secondary | ICD-10-CM

## 2019-02-19 MED ORDER — HEPARIN SOD (PORK) LOCK FLUSH 100 UNIT/ML IV SOLN
500.0000 [IU] | Freq: Once | INTRAVENOUS | Status: AC | PRN
  Administered 2019-02-19: 500 [IU]
  Filled 2019-02-19: qty 5

## 2019-02-19 MED ORDER — DEXAMETHASONE SODIUM PHOSPHATE 10 MG/ML IJ SOLN
INTRAMUSCULAR | Status: AC
  Filled 2019-02-19: qty 1

## 2019-02-19 MED ORDER — DOXORUBICIN HCL LIPOSOMAL CHEMO INJECTION 2 MG/ML
30.0000 mg/m2 | Freq: Once | INTRAVENOUS | Status: AC
  Administered 2019-02-19: 50 mg via INTRAVENOUS
  Filled 2019-02-19: qty 25

## 2019-02-19 MED ORDER — SODIUM CHLORIDE 0.9% FLUSH
10.0000 mL | INTRAVENOUS | Status: DC | PRN
  Administered 2019-02-19: 10 mL
  Filled 2019-02-19: qty 10

## 2019-02-19 MED ORDER — DEXTROSE 5 % IV SOLN
Freq: Once | INTRAVENOUS | Status: AC
  Administered 2019-02-19: 10:00:00 via INTRAVENOUS
  Filled 2019-02-19: qty 250

## 2019-02-19 MED ORDER — DEXAMETHASONE SODIUM PHOSPHATE 10 MG/ML IJ SOLN
10.0000 mg | Freq: Once | INTRAMUSCULAR | Status: AC
  Administered 2019-02-19: 10 mg via INTRAVENOUS

## 2019-02-19 NOTE — Telephone Encounter (Signed)
Returned call to Santiago Glad @ the [facility] Adv her that the echo ordered by Dr.Gorsuch has been read and is viewable in Epic to view. Santiago Glad is aware and voiced appreciation for the assistance.

## 2019-02-19 NOTE — Patient Instructions (Signed)
[facility] Discharge Instructions for Patients Receiving Chemotherapy  Today you received the following chemotherapy agents Doxil.   To help prevent nausea and vomiting after your treatment, we encourage you to take your nausea medication as directed.  If you develop nausea and vomiting that is not controlled by your nausea medication, call the clinic.   BELOW ARE SYMPTOMS THAT SHOULD BE REPORTED IMMEDIATELY:  *FEVER GREATER THAN 100.5 F  *CHILLS WITH OR WITHOUT FEVER  NAUSEA AND VOMITING THAT IS NOT CONTROLLED WITH YOUR NAUSEA MEDICATION  *UNUSUAL SHORTNESS OF BREATH  *UNUSUAL BRUISING OR BLEEDING  TENDERNESS IN MOUTH AND THROAT WITH OR WITHOUT PRESENCE OF ULCERS  *URINARY PROBLEMS  *BOWEL PROBLEMS  UNUSUAL RASH Items with * indicate a potential emergency and should be followed up as soon as possible.  Feel free to call the clinic should you have any questions or concerns. The clinic phone number is [phone].  Please show the [facility] at check-in to the Emergency Department and triage nurse. Doxorubicin Liposomal injection What is this medicine? LIPOSOMAL DOXORUBICIN (LIP oh som al dox oh ROO bi sin) is a chemotherapy drug. This medicine is used to treat many kinds of cancer like Kaposi's sarcoma, multiple myeloma, and ovarian cancer. This medicine may be used for other purposes; ask your health care provider or pharmacist if you have questions. COMMON BRAND NAME(S): Doxil, Lipodox What should I tell my health care provider before I take this medicine? They need to know if you have any of these conditions: -blood disorders -heart disease -infection (especially a virus infection such as chickenpox, cold sores, or herpes) -liver disease -recent or ongoing radiation therapy -an unusual or allergic reaction to doxorubicin, other chemotherapy agents, soybeans, other medicines, foods, dyes, or preservatives -pregnant or trying to get  pregnant -breast-feeding How should I use this medicine? This drug is given as an infusion into a vein. It is administered in a hospital or clinic by a specially trained health care professional. If you have pain, swelling, burning or any unusual feeling around the site of your injection, tell your health care professional right away. Talk to your pediatrician regarding the use of this medicine in children. Special care may be needed. Overdosage: If you think you have taken too much of this medicine contact a poison control center or emergency room at once. NOTE: This medicine is only for you. Do not share this medicine with others. What if I miss a dose? It is important not to miss your dose. Call your doctor or health care professional if you are unable to keep an appointment. What may interact with this medicine? Do not take this medicine with any of the following medications: -zidovudine This medicine may also interact with the following medications: -medicines to increase blood counts like filgrastim, pegfilgrastim, sargramostim -vaccines Talk to your doctor or health care professional before taking any of these medicines: -acetaminophen -aspirin -ibuprofen -ketoprofen -naproxen This list may not describe all possible interactions. Give your health care provider a list of all the medicines, herbs, non-prescription drugs, or dietary supplements you use. Also tell them if you smoke, drink alcohol, or use illegal drugs. Some items may interact with your medicine. What should I watch for while using this medicine? Your condition will be monitored carefully while you are receiving this medicine. You may need blood work done while you are taking this medicine. This drug may make you feel generally unwell. This is not uncommon, as chemotherapy can affect healthy  cells as well as cancer cells. Report any side effects. Continue your course of treatment even though you feel ill unless your doctor  tells you to stop. Your urine may turn orange-red for a few days after your dose. This is not blood. If your urine is dark or brown, call your doctor. In some cases, you may be given additional medicines to help with side effects. Follow all directions for their use. Talk to your doctor about your risk of cancer. You may be more at risk for certain types of cancers if you take this medicine. Do not become pregnant while taking this medicine or for 6 months after stopping it. Women should inform their healthcare professional if they wish to become pregnant or think they may be pregnant. Men should not father a child while taking this medicine and for 6 months after stopping it. There is a potential for serious side effects to an unborn child. Talk to your health care professional or pharmacist for more information. Do not breast-feed an infant while taking this medicine. This medicine has caused ovarian failure in some women. This medicine may make it more difficult to get pregnant. Talk to your healthcare professional if you are concerned about your fertility. This medicine has caused decreased sperm counts in some men. This may make it more difficult to father a child. Talk to your healthcare professional if you are concerned about your fertility. This medicine may cause a decrease in Co-Enzyme Q-10. You should make sure that you get enough Co-Enzyme Q-10 while you are taking this medicine. Discuss the foods you eat and the vitamins you take with your health care professional. What side effects may I notice from receiving this medicine? Side effects that you should report to your doctor or health care professional as soon as possible: -allergic reactions like skin rash, itching or hives, swelling of the face, lips, or tongue -low blood counts - this medicine may decrease the number of white blood cells, red blood cells and platelets. You may be at increased risk for infections and bleeding. -signs of  hand-foot syndrome - tingling or burning, redness, flaking, swelling, small blisters, or small sores on the palms of your hands or the soles of your feet -signs of infection - fever or chills, cough, sore throat, pain or difficulty passing urine -signs of decreased platelets or bleeding - bruising, pinpoint red spots on the skin, black, tarry stools, blood in the urine -signs of decreased red blood cells - unusually weak or tired, fainting spells, lightheadedness -back pain, chills, facial flushing, fever, headache, tightness in the chest or throat during the infusion -breathing problems -chest pain -fast, irregular heartbeat -mouth pain, redness, sores -pain, swelling, redness at site where injected -pain, tingling, numbness in the hands or feet -swelling of ankles, feet, or hands -vomiting Side effects that usually do not require medical attention (report to your doctor or health care professional if they continue or are bothersome): -diarrhea -hair loss -loss of appetite -nail discoloration or damage -nausea -red or watery eyes -red colored urine -stomach upset This list may not describe all possible side effects. Call your doctor for medical advice about side effects. You may report side effects to FDA at 1-800-FDA-1088. Where should I keep my medicine? This drug is given in a hospital or clinic and will not be stored at home. NOTE: This sheet is a summary. It may not cover all possible information. If you have questions about this medicine, talk to your doctor, pharmacist,  or health care provider.  2019 Elsevier/Gold Standard (2018-07-24 15:13:26)

## 2019-02-19 NOTE — Telephone Encounter (Signed)
Santiago Glad from [facility] (Dr. Alvy Bimler) is calling to see if the pt's echo has been read yet. She is waiting to get Chemo, but the Cancer center needs results from the Echo before her procedure.

## 2019-02-19 NOTE — Progress Notes (Signed)
Per Dr. Alvy Bimler it is ok to treat with ANC 1.4

## 2019-02-19 NOTE — Telephone Encounter (Signed)
Husband called and left a message to call him.  Called husband back. Told her Dr. Alvy Bimler Echo looks okay from last week. He verbalized understanding.

## 2019-03-05 ENCOUNTER — Other Ambulatory Visit: Payer: Self-pay | Admitting: Hematology and Oncology

## 2019-03-05 DIAGNOSIS — C569 Malignant neoplasm of unspecified ovary: Secondary | ICD-10-CM

## 2019-03-05 DIAGNOSIS — Z7189 Other specified counseling: Secondary | ICD-10-CM

## 2019-03-13 ENCOUNTER — Other Ambulatory Visit: Payer: Self-pay | Admitting: Hematology and Oncology

## 2019-03-19 ENCOUNTER — Inpatient Hospital Stay: Payer: Medicare Other | Admitting: Hematology and Oncology

## 2019-03-19 ENCOUNTER — Inpatient Hospital Stay: Payer: Medicare Other | Attending: Hematology and Oncology

## 2019-03-19 ENCOUNTER — Inpatient Hospital Stay: Payer: Medicare Other

## 2019-03-19 ENCOUNTER — Telehealth: Payer: Self-pay | Admitting: Hematology and Oncology

## 2019-03-19 ENCOUNTER — Encounter: Payer: Self-pay | Admitting: Hematology and Oncology

## 2019-03-19 ENCOUNTER — Other Ambulatory Visit: Payer: Self-pay

## 2019-03-19 DIAGNOSIS — Z5111 Encounter for antineoplastic chemotherapy: Secondary | ICD-10-CM | POA: Diagnosis not present

## 2019-03-19 DIAGNOSIS — G620 Drug-induced polyneuropathy: Secondary | ICD-10-CM | POA: Diagnosis not present

## 2019-03-19 DIAGNOSIS — Z79899 Other long term (current) drug therapy: Secondary | ICD-10-CM | POA: Insufficient documentation

## 2019-03-19 DIAGNOSIS — C569 Malignant neoplasm of unspecified ovary: Secondary | ICD-10-CM

## 2019-03-19 DIAGNOSIS — D61818 Other pancytopenia: Secondary | ICD-10-CM

## 2019-03-19 DIAGNOSIS — K5909 Other constipation: Secondary | ICD-10-CM | POA: Insufficient documentation

## 2019-03-19 DIAGNOSIS — Z9221 Personal history of antineoplastic chemotherapy: Secondary | ICD-10-CM | POA: Diagnosis not present

## 2019-03-19 DIAGNOSIS — C772 Secondary and unspecified malignant neoplasm of intra-abdominal lymph nodes: Secondary | ICD-10-CM | POA: Diagnosis not present

## 2019-03-19 DIAGNOSIS — Z7189 Other specified counseling: Secondary | ICD-10-CM

## 2019-03-19 LAB — CMP (CANCER CENTER ONLY)
ALT: 10 U/L (ref 0–44)
AST: 16 U/L (ref 15–41)
Albumin: 3.6 g/dL (ref 3.5–5.0)
Alkaline Phosphatase: 77 U/L (ref 38–126)
Anion gap: 10 (ref 5–15)
BUN: 11 mg/dL (ref 8–23)
CO2: 27 mmol/L (ref 22–32)
Calcium: 9.1 mg/dL (ref 8.9–10.3)
Chloride: 104 mmol/L (ref 98–111)
Creatinine: 0.73 mg/dL (ref 0.44–1.00)
GFR, Est AFR Am: >60 mL/min (ref >60)
GFR, Estimated: >60 mL/min (ref >60)
Glucose, Bld: 99 mg/dL (ref 70–99)
Potassium: 3.6 mmol/L (ref 3.5–5.1)
Sodium: 141 mmol/L (ref 135–145)
Total Bilirubin: 0.3 mg/dL (ref 0.3–1.2)
Total Protein: 6.5 g/dL (ref 6.5–8.1)

## 2019-03-19 LAB — CBC WITH DIFFERENTIAL (CANCER CENTER ONLY)
Abs Immature Granulocytes: 0.02 10*3/uL (ref 0.00–0.07)
Basophils Absolute: 0 10*3/uL (ref 0.0–0.1)
Basophils Relative: 1 %
Eosinophils Absolute: 0 10*3/uL (ref 0.0–0.5)
Eosinophils Relative: 1 %
HCT: 32 % — ABNORMAL LOW (ref 36.0–46.0)
Hemoglobin: 10.6 g/dL — ABNORMAL LOW (ref 12.0–15.0)
Immature Granulocytes: 1 %
Lymphocytes Relative: 30 %
Lymphs Abs: 1.1 10*3/uL (ref 0.7–4.0)
MCH: 34.5 pg — ABNORMAL HIGH (ref 26.0–34.0)
MCHC: 33.1 g/dL (ref 30.0–36.0)
MCV: 104.2 fL — ABNORMAL HIGH (ref 80.0–100.0)
Monocytes Absolute: 0.6 10*3/uL (ref 0.1–1.0)
Monocytes Relative: 16 %
Neutro Abs: 2 10*3/uL (ref 1.7–7.7)
Neutrophils Relative %: 51 %
Platelet Count: 160 10*3/uL (ref 150–400)
RBC: 3.07 MIL/uL — ABNORMAL LOW (ref 3.87–5.11)
RDW: 14.2 % (ref 11.5–15.5)
WBC Count: 3.8 10*3/uL — ABNORMAL LOW (ref 4.0–10.5)
nRBC: 0 % (ref 0.0–0.2)

## 2019-03-19 MED ORDER — DEXTROSE 5 % IV SOLN
Freq: Once | INTRAVENOUS | Status: AC
  Administered 2019-03-19: 10:00:00 via INTRAVENOUS
  Filled 2019-03-19: qty 250

## 2019-03-19 MED ORDER — HEPARIN SOD (PORK) LOCK FLUSH 100 UNIT/ML IV SOLN
500.0000 [IU] | Freq: Once | INTRAVENOUS | Status: AC | PRN
  Administered 2019-03-19: 11:00:00 500 [IU]
  Filled 2019-03-19: qty 5

## 2019-03-19 MED ORDER — SODIUM CHLORIDE 0.9% FLUSH
10.0000 mL | Freq: Once | INTRAVENOUS | Status: AC
  Administered 2019-03-19: 10 mL
  Filled 2019-03-19: qty 10

## 2019-03-19 MED ORDER — DEXAMETHASONE SODIUM PHOSPHATE 10 MG/ML IJ SOLN
INTRAMUSCULAR | Status: AC
  Filled 2019-03-19: qty 1

## 2019-03-19 MED ORDER — DEXAMETHASONE SODIUM PHOSPHATE 10 MG/ML IJ SOLN
10.0000 mg | Freq: Once | INTRAMUSCULAR | Status: AC
  Administered 2019-03-19: 10 mg via INTRAVENOUS

## 2019-03-19 MED ORDER — SODIUM CHLORIDE 0.9% FLUSH
10.0000 mL | INTRAVENOUS | Status: DC | PRN
  Administered 2019-03-19: 10 mL
  Filled 2019-03-19: qty 10

## 2019-03-19 MED ORDER — DOXORUBICIN HCL LIPOSOMAL CHEMO INJECTION 2 MG/ML
30.0000 mg/m2 | Freq: Once | INTRAVENOUS | Status: AC
  Administered 2019-03-19: 50 mg via INTRAVENOUS
  Filled 2019-03-19: qty 25

## 2019-03-19 NOTE — Progress Notes (Signed)
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[facility]) She tolerated treatment fairly well except for mild persistent pancytopenia of which she is not symptomatic We will proceed with reduced dose treatment I recommend minimum 3 cycles of chemotherapy before repeat imaging study  Pancytopenia, acquired ([place]) Her pancytopenia is stable and she is not symptomatic She will continue neutropenic precaution  Other constipation She continues to battle with intermittent constipation alternate with passage of loose stool On the days when she had loose stool, it was preceded with a bout of severe constipation for 3 to 4 days I suspect a day of loose stools were just liquid and she does have signs and symptoms of overflow diarrhea We discussed importance of consistent intake of stool softener and laxatives to avoid constipation   No orders of the defined types were placed in this encounter.   INTERVAL HISTORY: Please see below for problem oriented charting. 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 Progressed on Aromasin, switched to Tamoxifen Progressed on tamoxifen and Zejula Progressed on carboplatin and taxol [employer] One study: no actionable mutation     Malignant neoplasm of ovary ([place]) (Resolved)   03/11/2013 Initial Diagnosis    Malignant neoplasm of ovary     Recurrent carcinoma of ovary ([place])   01/05/2013 Tumor Marker    Patient's tumor was tested for the following markers: CA-125 Results of the tumor marker test revealed 178.8    01/17/2013 Imaging    CT scan of abdomen  10.0 cm complex right ovarian neoplasm, with findings worrisome for malignancy.  Surgical consultation is advised.  No findings to suggest metastatic disease in the abdomen/pelvis    02/13/2013 Pathology Results    1. Adnexa - ovary +/- tube, neoplastic - HIGH GRADE OVARIAN POORLY DIFFERENTIATED CARCINOMA WITH ASSOCIATED TUMOR NECROSIS, 11.5 CM, INVOLVING THE OVARIAN CAPSULE AND ADJACENT FALLOPIAN TUBE. - PLEASE SEE ONCOLOGY TEMPLATE FOR DETAILS. 2. Ovary and fallopian tube, left - HIGH GRADE OVARIAN POORLY DIFFERENTIATED CARCINOMA WITH ASSOCIATED TUMOR NECROSIS, 2.3 CM, INVOLVING THE OVARIAN CAPSULE. - FALLOPIAN TUBAL TISSUE, NO EVIDENCE OF MALIGNANCY. - PLEASE SEE ONCOLOGY TEMPLATE FOR DETAILS. 3. Uterus and cervix - ENDOMETRIUM: BENIGN WEAKLY PROLIFERATIVE ENDOMETRIUM, NO ATYPIA, HYPERPLASIA OR MALIGNANCY. - CERVIX: BENIGN SQUAMOUS MUCOSA AND ENDOCERVICAL MUCOSA, NO DYSPLASIA OR MALIGNANCY. - UTERINE SEROSA: ADHESIONS, NO EVIDENCE OF ENDOMETRIOSIS, ATYPIA OR MALIGNANCY. 4. Cul-de-sac biopsy - POSITIVE FOR METASTATIC CARCINOMA (2.0 CM). 5. Soft tissue mass, simple excision, peri rectal - POSITIVE FOR METASTATIC CARCINOMA (2.6 CM). 6. Lymph nodes, regional resection, right pelvic - SEVEN LYMPH NODES, NEGATIVE FOR METASTATIC CARCINOMA (0/7). 7. Omentum, resection for tumor - MATURE ADIPOSE TISSUE, NO EVIDENCE OF MALIGNANCY. Microscopic Comment 1. OVARY Specimen(s): Right and left ovaries and fallopian tubes, uterus and cervix Procedure(s): Total hysterectomy, bilateral salpingo-oophorectomy Lymph node sampling performed: Yes Primary tumor site: Right and left ovaries Ovarian surface involvement: Present Maximum tumor size (cm): Right ovary: 11.5 cm; left ovary: 2.3 cm, gross measurement. 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 please see comment. Grade: High grade Peritoneal implants: No Pelvic extension: Cul-de-sac biopsy and perirectal soft tissue biopsy: positive Peritoneal washings: Rare cluster of atypical cells present ([id number]) Lymph nodes: number examined 7 ; number positive 0 TNM code: pT2c, pN0 FIGO Stage (based on pathologic findings, needs clinical correlation): II B (2014 edition) Comments: Sections of bilateral ovaries show sheets and nests of malignant epithelium resembling transitional cell carcinoma with associated tumor necrosis. No evidence of Brenner's tumor or conventional endometrioid carcinoma is identified in the background. The bilateral ovarian capsules are involved. Immunohistochemical stains were performed and the malignant cells are strongly positive for cytokeratin 7, WT-1, p16 and p53, negative for cytokeratin 20. 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 The tumor involves the right fallopian tube. Both cul-de-sac and perirectal soft tissue biopsies are positive for metastatic carcinoma. The omentum biopsy is negative for tumor.     03/20/2013 - 08/14/2013 Chemotherapy    The patient had 6 cycles of carboplatin and taxol    09/25/2013 Imaging    1. Interval resection of previously described right pelvic mass. No evidence of recurrent or metastatic disease. 2. Small hiatal hernia. 3. Moderate amount of stool within the rectum. Question constipation or even mild fecal impaction.    04/03/2015 PET scan    1. Highly hypermetabolic height bowed dense mass in the medial spleen compatible with malignancy. 2. Bilateral pelvic sidewall adenopathy, right greater than left, hypermetabolic, with small peritoneal implants of tumor in the lower pelvis, and with several faint but suspected omental implants  of tumor. There is also a hypermetabolic aortocaval lymph node in the retroperitoneum. 3. Generalized increased thyroid activity could be due to thyroiditis. Although a well-defined nodule is not seen in the right thyroid lobe, there is focal increased hypermetabolic activity in the right thyroid lobe. Thyroid ultrasound is recommended for further investigation. 4. I suspect that the high activity between the spinous processes of L3 and L4 is degenerative.    05/01/2015 - 10/21/2015 Chemotherapy    The patient had 8 cycles of carboplatin, taxol and Avastin.      07/02/2015 Imaging    1. Decrease in size of these splenic lesion. Suspect tumor also involving the adjacent posterior wall of the stomach. 2. Improved pelvic lymphadenopathy. 3. No new omental or peritoneal surface disease.     09/10/2015 Imaging    1. Response to therapy, with further decreased size of a medial splenic lesion. The previously questioned extension into the stomach is no longer identified and may have been artifactual on the prior exam. Of note, an inferior splenic low-density capsular based lesion is felt to be similar and warrants followup attention. 2. No adenopathy or new peritoneal disease identified. 3.  Possible constipation. 4.  Advanced aortic and branch vessel atherosclerosis.    11/06/2015 Imaging    1. Mass arising from the medial spleen and involving the posterior wall of stomach demonstrates mild decrease in size in the interval. 2. Interval improvement and previously referenced pathologic iliac lymph nodes. 3. Previously referenced peritoneal implants within the pelvis are no longer measurable. No new areas of peritoneal metastasis is identified. 4. There is a right hilar node which is borderline enlarged but is increased in size when compared with 04/19/2013. Attention on follow-up examination is recommended.    11/18/2015 PET scan    1. Residual hypermetabolic tumor implant  between the stomach and  spleen, significantly decreased in size and mildly decreased in metabolism compared to 04/03/2015 PET-CT, and slightly decreased in size since 11/06/2015. 2. Residual hypermetabolic right deep pelvic side wall lymph node, with partial treatment response compared to 04/03/2015 PET-CT. 3. No new sites of hypermetabolic metastatic disease. 4. Stable diffuse thyroid hypermetabolism without discrete thyroid nodule, most suggestive of diffuse thyroiditis. Recommend correlation with serum thyroid function tests.     01/28/2016 - 11/26/2016 Anti-estrogen oral therapy    She was placed on Letrozole, stopped due to poor tolerance    02/09/2016 Imaging    1. Continued decrease in size of peritoneal lesion between the posterior wall of stomach and spleen. 2. No new or progressive disease identified    06/23/2016 Imaging    1. Near complete resolution of subtle residual soft tissue density lesion in the gastrosplenic ligament. 2. No new sites of disease identified. 3. Stool burden suggests constipation or even fecal impaction    12/01/2016 Imaging    1. No CT findings for residual or recurrent abdominal/pelvic ovarian cancer. 2. No acute abdominal/pelvic findings. 3. Large amount of stool throughout the colon and down into the rectum suggesting constipation. 4. Stable atherosclerotic calcifications involving the aorta and iliac arteries.    03/29/2017 - 10/29/2017 Anti-estrogen oral therapy    She was on Aromasin    04/19/2017 Imaging    1. Suspicion of peritoneal recurrence with enlarging implants in the gastrosplenic ligament, left adnexa and posterior to the cecum. No generalized ascites, adenopathy or definite solid organ disease. 2. No evidence of bowel or ureteral obstruction. 3.  Aortic Atherosclerosis (ICD10-I70.0).    06/25/2017 Genetic Testing    Patient has genetic testing done for Breast/ovarian cancer panel Results revealed patient has no detectable mutation    09/01/2017 Tumor Marker     Patient's tumor was tested for the following markers: CA-125 Results of the tumor marker test revealed 40.9    10/29/2017 - 03/24/2018 Anti-estrogen oral therapy    She was on tamoxifen    11/05/2017 Tumor Marker    Patient's tumor was tested for the following markers: CA-125 Results of the tumor marker test revealed 84.4    11/15/2017 Imaging    1. Mild progression of peritoneal metastasis. 2. Possible constipation. No obstruction or other acute complication. 3.  Aortic Atherosclerosis (ICD10-I70.0).    03/21/2018 Tumor Marker    Patient's tumor was tested for the following markers: CA-125 Results of the tumor marker test revealed 101.3.    04/03/2018 Tumor Marker    Patient's tumor was tested for the following markers: CA-125 Results of the tumor marker test revealed 120.9    04/10/2018 - 07/17/2018 Chemotherapy    The patient had single agent carboplatin x 5 cycles    05/02/2018 Tumor Marker    Patient's tumor was tested for the following markers: CA-125 Results of the tumor marker test revealed 68.2    05/29/2018 Tumor Marker    Patient's tumor was tested for the following markers: CA-125 Results of the tumor marker test revealed 41.7     Genetic Testing    Patient has genetic testing done for HRD. Results revealed: negative for deleterious BRCA1 or BRCA2 mutations in tumor.     06/26/2018 Tumor Marker    Patient's tumor was tested for the following markers: CA-125 Results of the tumor marker test revealed 29.5    07/14/2018 Imaging    Interval decrease in size of bilateral adnexal masses.  No significant change in  size of peritoneal metastasis in the left upper quadrant.  No new or progressive metastatic disease identified.    08/07/2018 - 11/07/2018 Chemotherapy    The patient had zejula    08/14/2018 Tumor Marker    Patient's tumor was tested for the following markers: CA-125 Results of the tumor marker test revealed 31.7    10/16/2018 Tumor Marker    Patient's tumor  was tested for the following markers: CA-125 Results of the tumor marker test revealed 49    11/06/2018 Imaging    1. Mild to moderate enlargement of the left upper quadrant lesion along the gastric wall and pancreatic tail suspicious for malignancy. Subtle invasion of the capsule of the spleen as before. 2. Stable soft tissue nodularity along the right adnexa. Slightly reduced soft tissue prominence along the left adnexa. By report the patient has had bilateral salpingo oophorectomy and accordingly these adnexal lesions are concerning for tumor. 3. No new areas of tumor nodularity are identified. 4. Other imaging findings of potential clinical significance: Pelvic floor laxity with small cystocele. Prominent stool throughout the colon favors constipation. Upper normal sized left periaortic lymph node. Bibasilar cylindrical bronchiectasis. Aortic Atherosclerosis (ICD10-I70.0). Coronary atherosclerosis.    11/06/2018 Tumor Marker    Patient's tumor was tested for the following markers: CA-125 Results of the tumor marker test revealed 61.5    11/25/2018 - 01/08/2019 Chemotherapy    The patient had carboplatin and taxol    12/18/2018 Tumor Marker    Patient's tumor was tested for the following markers: CA-125 Results of the tumor marker test revealed 55.3    01/26/2019 Imaging    3.2 x 4.2 cm low-density lesion along the posterior aspect of the gastric cardia, abutting the splenic hilum and pancreatic tail, suspicious for metastasis. This is stable versus mildly decreased.  2.9 x 2.8 cm mass along the posterior aspect of the cecum (previously described as right adnexal on prior CT), favoring cecal/appendiceal neoplasm, grossly unchanged.  2.6 cm left pelvic/adnexal implant, suspicious for metastasis, mildly increased.  11 mm short axis left para-aortic nodal metastasis, mildly increased.  Low-density splenic lesions along the posterior spleen, new, indeterminate.    01/26/2019 Tumor  Marker    Patient's tumor was tested for the following markers: CA-125 Results of the tumor marker test revealed 41.4    02/16/2019 Echocardiogram     1. The left ventricle has hyperdynamic systolic function, with an ejection fraction of >65%. The cavity size was normal. There is mild concentric left ventricular hypertrophy. Left ventricular diastolic Doppler parameters are consistent with impaired relaxation.  2. The right ventricle has normal systolic function. The cavity was normal. There is no increase in right ventricular wall thickness.  3. Mild thickening of the mitral valve leaflet.    02/19/2019 -  Chemotherapy    The patient had DOXOrubicin HCL LIPOSOMAL (DOXIL)      REVIEW OF SYSTEMS:   Constitutional: Denies fevers, chills or abnormal weight loss Eyes: Denies blurriness of vision Ears, nose, mouth, throat, and face: Denies mucositis or sore throat Respiratory: Denies cough, dyspnea or wheezes Cardiovascular: Denies palpitation, chest discomfort or lower extremity swelling Skin: Denies abnormal skin rashes Lymphatics: Denies new lymphadenopathy or easy bruising Neurological:Denies numbness, tingling or new weaknesses Behavioral/Psych: Mood is stable, no new changes  All other systems were reviewed with the patient and are negative.  I have reviewed the past medical history, past surgical history, social history and family history with the patient and they are unchanged from previous note.  ALLERGIES:  is allergic to clonidine derivatives; coreg [carvedilol]; caffeine; codeine; flexeril [cyclobenzaprine]; lipitor [atorvastatin]; lisinopril; pravastatin; tape; tegaderm ag mesh [silver]; and ultram [tramadol].  MEDICATIONS:  Current Outpatient Medications  Medication Sig Dispense Refill  . acetaminophen (TYLENOL) 500 MG tablet Take 1,000 mg by mouth every 6 (six) hours as needed for mild pain.     Marland Kitchen alendronate (FOSAMAX) 70 MG tablet Take 70 mg by mouth once a week. Take with  a full glass of water on an empty stomach.    Marland Kitchen amiodarone (PACERONE) 200 MG tablet TAKE 1/2 TABLET BY MOUTH DAILY 45 tablet 3  . amoxicillin (AMOXIL) 500 MG capsule Take 2,000 mg by mouth See admin instructions. Takes 4 capsules 1 hour prior to procedure    . Biotin 10 MG TABS Take 20 mg by mouth daily.    Marland Kitchen desonide (DESOWEN) 0.05 % cream Apply 1 application topically 2 (two) times daily.   1  . diphenhydrAMINE (BENADRYL) 25 MG tablet Take 50 mg by mouth at bedtime as needed for sleep.     Marland Kitchen docusate sodium (COLACE) 100 MG capsule Take 250 mg by mouth 2 (two) times daily.     Marland Kitchen ELIQUIS 5 MG TABS tablet TAKE 1 TABLET BY MOUTH TWICE A DAY 60 tablet 4  . Evolocumab with Infusor 420 MG/3.5ML SOCT Inject into the skin.    Marland Kitchen ezetimibe (ZETIA) 10 MG tablet Take 1 tablet (10 mg total) by mouth daily. (Patient taking differently: Take 10 mg by mouth at bedtime. ) 30 tablet 6  . Glucosamine HCl 1000 MG TABS Take 1,000 mg by mouth daily.    . hydrocortisone 1 % ointment Apply 1 application topically 2 (two) times daily as needed for itching (eczema).     Marland Kitchen ipratropium (ATROVENT) 0.06 % nasal spray Place 2 sprays into both nostrils daily.   5  . levothyroxine (SYNTHROID) 88 MCG tablet Take 88 mcg by mouth daily before breakfast.    . lidocaine-prilocaine (EMLA) cream Apply to Porta-Cath site 1-2 hours prior to access as directed. 30 g 1  . linaclotide (LINZESS) 145 MCG CAPS capsule Take 145 mcg by mouth daily as needed for constipation.    . magnesium hydroxide (MILK OF MAGNESIA) 800 MG/5ML suspension Take 30 mLs by mouth daily as needed for constipation. Reported on 11/20/2015    . Melatonin 5 MG CAPS Take 5 mg by mouth at bedtime as needed (sleep). Reported on 12/25/2015    . Menthol, Topical Analgesic, (ICY HOT EX) Apply 1 application topically 2 (two) times daily as needed (PAIN).     . Multiple Vitamin (MULTIVITAMIN WITH MINERALS) TABS tablet Take 1 tablet by mouth daily. Centrum Silver    .  ondansetron (ZOFRAN) 8 MG tablet TAKE 1 TABLET (8 MG TOTAL) BY MOUTH EVERY 8 (EIGHT) HOURS AS NEEDED FOR NAUSEA OR VOMITING. 90 tablet 1  . OVER THE COUNTER MEDICATION Co210 20 mg once a day for muscle aches.    Vladimir Faster Glycol-Propyl Glycol (SYSTANE OP) Place 1 drop into both eyes 2 (two) times daily.     . polyethylene glycol (MIRALAX / GLYCOLAX) packet Take 17 g by mouth daily. Mix in 8 oz liquid and drink    . potassium chloride (K-DUR) 10 MEQ tablet Take 10 mEq by mouth daily.    . prochlorperazine (COMPAZINE) 10 MG tablet TAKE 1 TABLET (10 MG TOTAL) BY MOUTH EVERY 6 (SIX) HOURS AS NEEDED (NAUSEA OR VOMITING). 30 tablet 1   No current facility-administered  medications for this visit.    Facility-Administered Medications Ordered in Other Visits  Medication Dose Route Frequency Provider Last Rate Last Dose  . sodium chloride flush (NS) 0.9 % injection 10 mL  10 mL Intravenous PRN Joylene John D, NP   10 mL at 01/06/18 1220    PHYSICAL EXAMINATION: ECOG PERFORMANCE STATUS: 1 - Symptomatic but completely ambulatory  Vitals:   03/19/19 0854  BP: (!) 145/78  Pulse: 70  Resp: 18  Temp: 98 F (36.7 C)  SpO2: 100%   Filed Weights   03/19/19 0854  Weight: 132 lb 12.8 oz (60.2 kg)    GENERAL:alert, no distress and comfortable SKIN: skin color, texture, turgor are normal, no rashes or significant lesions EYES: normal, Conjunctiva are pink and non-injected, sclera clear OROPHARYNX:no exudate, no erythema and lips, buccal mucosa, and tongue normal  NECK: supple, thyroid normal size, non-tender, without nodularity LYMPH:  no palpable lymphadenopathy in the cervical, axillary or inguinal LUNGS: clear to auscultation and percussion with normal breathing effort HEART: regular rate & rhythm and no murmurs and no lower extremity edema ABDOMEN:abdomen soft, mildly distended but no evidence of ascites, non-tender and normal bowel sounds Musculoskeletal:no cyanosis of digits and no  clubbing  NEURO: alert & oriented x 3 with fluent speech, no focal motor/sensory deficits  LABORATORY DATA:  I have reviewed the data as listed    Component Value Date/Time   NA 141 02/13/2019 1143   NA 141 11/11/2017 1145   K 4.3 02/13/2019 1143   K 4.0 11/11/2017 1145   CL 104 02/13/2019 1143   CL 103 05/21/2013 1105   CO2 27 02/13/2019 1143   CO2 29 11/11/2017 1145   GLUCOSE 79 02/13/2019 1143   GLUCOSE 81 11/11/2017 1145   GLUCOSE 74 05/21/2013 1105   BUN 12 02/13/2019 1143   BUN 10.9 11/11/2017 1145   CREATININE 0.77 02/13/2019 1143   CREATININE 0.7 11/11/2017 1145   CALCIUM 9.4 02/13/2019 1143   CALCIUM 9.6 11/11/2017 1145   PROT 6.7 02/13/2019 1143   PROT 6.5 11/24/2016 1104   ALBUMIN 4.1 02/13/2019 1143   ALBUMIN 3.9 11/24/2016 1104   AST 18 02/13/2019 1143   AST 18 11/24/2016 1104   ALT 13 02/13/2019 1143   ALT 15 11/24/2016 1104   ALKPHOS 88 02/13/2019 1143   ALKPHOS 104 11/24/2016 1104   BILITOT 0.4 02/13/2019 1143   BILITOT 0.57 11/24/2016 1104   GFRNONAA >60 02/13/2019 1143   GFRAA >60 02/13/2019 1143    No results found for: SPEP, UPEP  Lab Results  Component Value Date   WBC 3.8 (L) 03/19/2019   NEUTROABS 2.0 03/19/2019   HGB 10.6 (L) 03/19/2019   HCT 32.0 (L) 03/19/2019   MCV 104.2 (H) 03/19/2019   PLT 160 03/19/2019      Chemistry      Component Value Date/Time   NA 141 02/13/2019 1143   NA 141 11/11/2017 1145   K 4.3 02/13/2019 1143   K 4.0 11/11/2017 1145   CL 104 02/13/2019 1143   CL 103 05/21/2013 1105   CO2 27 02/13/2019 1143   CO2 29 11/11/2017 1145   BUN 12 02/13/2019 1143   BUN 10.9 11/11/2017 1145   CREATININE 0.77 02/13/2019 1143   CREATININE 0.7 11/11/2017 1145      Component Value Date/Time   CALCIUM 9.4 02/13/2019 1143   CALCIUM 9.6 11/11/2017 1145   ALKPHOS 88 02/13/2019 1143   ALKPHOS 104 11/24/2016 1104   AST 18 02/13/2019  1143   AST 18 11/24/2016 1104   ALT 13 02/13/2019 1143   ALT 15 11/24/2016 1104    BILITOT 0.4 02/13/2019 1143   BILITOT 0.57 11/24/2016 1104     All questions were answered. The patient knows to call the clinic with any problems, questions or concerns. No barriers to learning was detected.  I spent 15 minutes counseling the patient face to face. The total time spent in the appointment was 20 minutes and more than 50% was on counseling and review of test results  Heath Lark, MD 03/19/2019 9:23 AM

## 2019-03-19 NOTE — Assessment & Plan Note (Signed)
Her pancytopenia is stable and she is not symptomatic She will continue neutropenic precaution

## 2019-03-19 NOTE — Assessment & Plan Note (Signed)
She tolerated treatment fairly well except for mild persistent pancytopenia of which she is not symptomatic We will proceed with reduced dose treatment I recommend minimum 3 cycles of chemotherapy before repeat imaging study

## 2019-03-19 NOTE — Assessment & Plan Note (Signed)
She continues to battle with intermittent constipation alternate with passage of loose stool On the days when she had loose stool, it was preceded with a bout of severe constipation for 3 to 4 days I suspect a day of loose stools were just liquid and she does have signs and symptoms of overflow diarrhea We discussed importance of consistent intake of stool softener and laxatives to avoid constipation

## 2019-03-19 NOTE — Telephone Encounter (Signed)
Added appointments for 5/18 per 4/20 schedule message. Patient will receive updated schedule in infusion.

## 2019-03-19 NOTE — Patient Instructions (Signed)
[facility] Discharge Instructions for Patients Receiving Chemotherapy  Today you received the following chemotherapy agents Doxil.   To help prevent nausea and vomiting after your treatment, we encourage you to take your nausea medication as directed.  If you develop nausea and vomiting that is not controlled by your nausea medication, call the clinic.   BELOW ARE SYMPTOMS THAT SHOULD BE REPORTED IMMEDIATELY:  *FEVER GREATER THAN 100.5 F  *CHILLS WITH OR WITHOUT FEVER  NAUSEA AND VOMITING THAT IS NOT CONTROLLED WITH YOUR NAUSEA MEDICATION  *UNUSUAL SHORTNESS OF BREATH  *UNUSUAL BRUISING OR BLEEDING  TENDERNESS IN MOUTH AND THROAT WITH OR WITHOUT PRESENCE OF ULCERS  *URINARY PROBLEMS  *BOWEL PROBLEMS  UNUSUAL RASH Items with * indicate a potential emergency and should be followed up as soon as possible.  Feel free to call the clinic should you have any questions or concerns. The clinic phone number is [phone].  Please show the [facility] at check-in to the Emergency Department and triage nurse. Doxorubicin Liposomal injection What is this medicine? LIPOSOMAL DOXORUBICIN (LIP oh som al dox oh ROO bi sin) is a chemotherapy drug. This medicine is used to treat many kinds of cancer like Kaposi's sarcoma, multiple myeloma, and ovarian cancer. This medicine may be used for other purposes; ask your health care provider or pharmacist if you have questions. COMMON BRAND NAME(S): Doxil, Lipodox What should I tell my health care provider before I take this medicine? They need to know if you have any of these conditions: -blood disorders -heart disease -infection (especially a virus infection such as chickenpox, cold sores, or herpes) -liver disease -recent or ongoing radiation therapy -an unusual or allergic reaction to doxorubicin, other chemotherapy agents, soybeans, other medicines, foods, dyes, or preservatives -pregnant or trying to get  pregnant -breast-feeding How should I use this medicine? This drug is given as an infusion into a vein. It is administered in a hospital or clinic by a specially trained health care professional. If you have pain, swelling, burning or any unusual feeling around the site of your injection, tell your health care professional right away. Talk to your pediatrician regarding the use of this medicine in children. Special care may be needed. Overdosage: If you think you have taken too much of this medicine contact a poison control center or emergency room at once. NOTE: This medicine is only for you. Do not share this medicine with others. What if I miss a dose? It is important not to miss your dose. Call your doctor or health care professional if you are unable to keep an appointment. What may interact with this medicine? Do not take this medicine with any of the following medications: -zidovudine This medicine may also interact with the following medications: -medicines to increase blood counts like filgrastim, pegfilgrastim, sargramostim -vaccines Talk to your doctor or health care professional before taking any of these medicines: -acetaminophen -aspirin -ibuprofen -ketoprofen -naproxen This list may not describe all possible interactions. Give your health care provider a list of all the medicines, herbs, non-prescription drugs, or dietary supplements you use. Also tell them if you smoke, drink alcohol, or use illegal drugs. Some items may interact with your medicine. What should I watch for while using this medicine? Your condition will be monitored carefully while you are receiving this medicine. You may need blood work done while you are taking this medicine. This drug may make you feel generally unwell. This is not uncommon, as chemotherapy can affect healthy  cells as well as cancer cells. Report any side effects. Continue your course of treatment even though you feel ill unless your doctor  tells you to stop. Your urine may turn orange-red for a few days after your dose. This is not blood. If your urine is dark or brown, call your doctor. In some cases, you may be given additional medicines to help with side effects. Follow all directions for their use. Talk to your doctor about your risk of cancer. You may be more at risk for certain types of cancers if you take this medicine. Do not become pregnant while taking this medicine or for 6 months after stopping it. Women should inform their healthcare professional if they wish to become pregnant or think they may be pregnant. Men should not father a child while taking this medicine and for 6 months after stopping it. There is a potential for serious side effects to an unborn child. Talk to your health care professional or pharmacist for more information. Do not breast-feed an infant while taking this medicine. This medicine has caused ovarian failure in some women. This medicine may make it more difficult to get pregnant. Talk to your healthcare professional if you are concerned about your fertility. This medicine has caused decreased sperm counts in some men. This may make it more difficult to father a child. Talk to your healthcare professional if you are concerned about your fertility. This medicine may cause a decrease in Co-Enzyme Q-10. You should make sure that you get enough Co-Enzyme Q-10 while you are taking this medicine. Discuss the foods you eat and the vitamins you take with your health care professional. What side effects may I notice from receiving this medicine? Side effects that you should report to your doctor or health care professional as soon as possible: -allergic reactions like skin rash, itching or hives, swelling of the face, lips, or tongue -low blood counts - this medicine may decrease the number of white blood cells, red blood cells and platelets. You may be at increased risk for infections and bleeding. -signs of  hand-foot syndrome - tingling or burning, redness, flaking, swelling, small blisters, or small sores on the palms of your hands or the soles of your feet -signs of infection - fever or chills, cough, sore throat, pain or difficulty passing urine -signs of decreased platelets or bleeding - bruising, pinpoint red spots on the skin, black, tarry stools, blood in the urine -signs of decreased red blood cells - unusually weak or tired, fainting spells, lightheadedness -back pain, chills, facial flushing, fever, headache, tightness in the chest or throat during the infusion -breathing problems -chest pain -fast, irregular heartbeat -mouth pain, redness, sores -pain, swelling, redness at site where injected -pain, tingling, numbness in the hands or feet -swelling of ankles, feet, or hands -vomiting Side effects that usually do not require medical attention (report to your doctor or health care professional if they continue or are bothersome): -diarrhea -hair loss -loss of appetite -nail discoloration or damage -nausea -red or watery eyes -red colored urine -stomach upset This list may not describe all possible side effects. Call your doctor for medical advice about side effects. You may report side effects to FDA at 1-800-FDA-1088. Where should I keep my medicine? This drug is given in a hospital or clinic and will not be stored at home. NOTE: This sheet is a summary. It may not cover all possible information. If you have questions about this medicine, talk to your doctor, pharmacist,  or health care provider.  2019 Elsevier/Gold Standard (2018-07-24 15:13:26)

## 2019-03-20 LAB — CA 125: Cancer Antigen (CA) 125: 90 U/mL — ABNORMAL HIGH (ref 0.0–38.1)

## 2019-03-21 ENCOUNTER — Telehealth: Payer: Self-pay

## 2019-03-21 NOTE — Telephone Encounter (Signed)
Spoke with pt and gave results of CA125.

## 2019-04-02 ENCOUNTER — Other Ambulatory Visit: Payer: Self-pay | Admitting: Internal Medicine

## 2019-04-02 DIAGNOSIS — Z1231 Encounter for screening mammogram for malignant neoplasm of breast: Secondary | ICD-10-CM

## 2019-04-16 ENCOUNTER — Inpatient Hospital Stay: Payer: Medicare Other

## 2019-04-16 ENCOUNTER — Inpatient Hospital Stay: Payer: Medicare Other | Attending: Hematology and Oncology | Admitting: Hematology and Oncology

## 2019-04-16 ENCOUNTER — Other Ambulatory Visit: Payer: Self-pay

## 2019-04-16 VITALS — BP 156/83 | HR 65 | Temp 98.5°F | Resp 18 | Ht 63.0 in | Wt 133.8 lb

## 2019-04-16 DIAGNOSIS — C561 Malignant neoplasm of right ovary: Secondary | ICD-10-CM

## 2019-04-16 DIAGNOSIS — Z7189 Other specified counseling: Secondary | ICD-10-CM

## 2019-04-16 DIAGNOSIS — G620 Drug-induced polyneuropathy: Secondary | ICD-10-CM | POA: Diagnosis not present

## 2019-04-16 DIAGNOSIS — Z9221 Personal history of antineoplastic chemotherapy: Secondary | ICD-10-CM | POA: Diagnosis not present

## 2019-04-16 DIAGNOSIS — C772 Secondary and unspecified malignant neoplasm of intra-abdominal lymph nodes: Secondary | ICD-10-CM | POA: Insufficient documentation

## 2019-04-16 DIAGNOSIS — T451X5A Adverse effect of antineoplastic and immunosuppressive drugs, initial encounter: Secondary | ICD-10-CM | POA: Diagnosis not present

## 2019-04-16 DIAGNOSIS — D61818 Other pancytopenia: Secondary | ICD-10-CM | POA: Insufficient documentation

## 2019-04-16 DIAGNOSIS — Z5111 Encounter for antineoplastic chemotherapy: Secondary | ICD-10-CM | POA: Insufficient documentation

## 2019-04-16 DIAGNOSIS — C569 Malignant neoplasm of unspecified ovary: Secondary | ICD-10-CM

## 2019-04-16 DIAGNOSIS — K5909 Other constipation: Secondary | ICD-10-CM | POA: Insufficient documentation

## 2019-04-16 DIAGNOSIS — Z79899 Other long term (current) drug therapy: Secondary | ICD-10-CM | POA: Diagnosis not present

## 2019-04-16 LAB — CBC WITH DIFFERENTIAL (CANCER CENTER ONLY)
Abs Immature Granulocytes: 0.01 10*3/uL (ref 0.00–0.07)
Basophils Absolute: 0 10*3/uL (ref 0.0–0.1)
Basophils Relative: 0 %
Eosinophils Absolute: 0 10*3/uL (ref 0.0–0.5)
Eosinophils Relative: 0 %
HCT: 32.9 % — ABNORMAL LOW (ref 36.0–46.0)
Hemoglobin: 10.8 g/dL — ABNORMAL LOW (ref 12.0–15.0)
Immature Granulocytes: 0 %
Lymphocytes Relative: 32 %
Lymphs Abs: 1.1 10*3/uL (ref 0.7–4.0)
MCH: 34.3 pg — ABNORMAL HIGH (ref 26.0–34.0)
MCHC: 32.8 g/dL (ref 30.0–36.0)
MCV: 104.4 fL — ABNORMAL HIGH (ref 80.0–100.0)
Monocytes Absolute: 0.4 10*3/uL (ref 0.1–1.0)
Monocytes Relative: 13 %
Neutro Abs: 1.8 10*3/uL (ref 1.7–7.7)
Neutrophils Relative %: 55 %
Platelet Count: 122 10*3/uL — ABNORMAL LOW (ref 150–400)
RBC: 3.15 MIL/uL — ABNORMAL LOW (ref 3.87–5.11)
RDW: 14.6 % (ref 11.5–15.5)
WBC Count: 3.3 10*3/uL — ABNORMAL LOW (ref 4.0–10.5)
nRBC: 0 % (ref 0.0–0.2)

## 2019-04-16 LAB — CMP (CANCER CENTER ONLY)
ALT: 14 U/L (ref 0–44)
AST: 18 U/L (ref 15–41)
Albumin: 3.8 g/dL (ref 3.5–5.0)
Alkaline Phosphatase: 77 U/L (ref 38–126)
Anion gap: 8 (ref 5–15)
BUN: 11 mg/dL (ref 8–23)
CO2: 30 mmol/L (ref 22–32)
Calcium: 9.2 mg/dL (ref 8.9–10.3)
Chloride: 104 mmol/L (ref 98–111)
Creatinine: 0.74 mg/dL (ref 0.44–1.00)
GFR, Est AFR Am: >60 mL/min (ref >60)
GFR, Estimated: >60 mL/min (ref >60)
Glucose, Bld: 94 mg/dL (ref 70–99)
Potassium: 3.5 mmol/L (ref 3.5–5.1)
Sodium: 142 mmol/L (ref 135–145)
Total Bilirubin: 0.4 mg/dL (ref 0.3–1.2)
Total Protein: 6.3 g/dL — ABNORMAL LOW (ref 6.5–8.1)

## 2019-04-16 MED ORDER — SODIUM CHLORIDE 0.9% FLUSH
10.0000 mL | INTRAVENOUS | Status: DC | PRN
  Administered 2019-04-16: 14:00:00 10 mL
  Filled 2019-04-16: qty 10

## 2019-04-16 MED ORDER — DOXORUBICIN HCL LIPOSOMAL CHEMO INJECTION 2 MG/ML
30.0000 mg/m2 | Freq: Once | INTRAVENOUS | Status: AC
  Administered 2019-04-16: 12:00:00 50 mg via INTRAVENOUS
  Filled 2019-04-16: qty 25

## 2019-04-16 MED ORDER — DEXTROSE 5 % IV SOLN
Freq: Once | INTRAVENOUS | Status: AC
  Administered 2019-04-16: 12:00:00 via INTRAVENOUS
  Filled 2019-04-16: qty 250

## 2019-04-16 MED ORDER — SODIUM CHLORIDE 0.9% FLUSH
10.0000 mL | Freq: Once | INTRAVENOUS | Status: AC
  Administered 2019-04-16: 10:00:00 10 mL
  Filled 2019-04-16: qty 10

## 2019-04-16 MED ORDER — HEPARIN SOD (PORK) LOCK FLUSH 100 UNIT/ML IV SOLN
500.0000 [IU] | Freq: Once | INTRAVENOUS | Status: AC | PRN
  Administered 2019-04-16: 14:00:00 500 [IU]
  Filled 2019-04-16: qty 5

## 2019-04-16 MED ORDER — DEXAMETHASONE SODIUM PHOSPHATE 10 MG/ML IJ SOLN
INTRAMUSCULAR | Status: AC
  Filled 2019-04-16: qty 1

## 2019-04-16 MED ORDER — DEXAMETHASONE SODIUM PHOSPHATE 10 MG/ML IJ SOLN
10.0000 mg | Freq: Once | INTRAMUSCULAR | Status: AC
  Administered 2019-04-16: 12:00:00 10 mg via INTRAVENOUS

## 2019-04-16 NOTE — Patient Instructions (Signed)
[facility] Discharge Instructions for Patients Receiving Chemotherapy  Today you received the following chemotherapy agents Doxil   To help prevent nausea and vomiting after your treatment, we encourage you to take your nausea medication as directed.   If you develop nausea and vomiting that is not controlled by your nausea medication, call the clinic.   BELOW ARE SYMPTOMS THAT SHOULD BE REPORTED IMMEDIATELY:  *FEVER GREATER THAN 100.5 F  *CHILLS WITH OR WITHOUT FEVER  NAUSEA AND VOMITING THAT IS NOT CONTROLLED WITH YOUR NAUSEA MEDICATION  *UNUSUAL SHORTNESS OF BREATH  *UNUSUAL BRUISING OR BLEEDING  TENDERNESS IN MOUTH AND THROAT WITH OR WITHOUT PRESENCE OF ULCERS  *URINARY PROBLEMS  *BOWEL PROBLEMS  UNUSUAL RASH Items with * indicate a potential emergency and should be followed up as soon as possible.  Feel free to call the clinic should you have any questions or concerns. The clinic phone number is [phone].  Please show the CHEMO ALERT CARD at check-in to the Emergency Department and triage nurse.   

## 2019-04-17 ENCOUNTER — Encounter: Payer: Self-pay | Admitting: Hematology and Oncology

## 2019-04-17 LAB — CA 125: Cancer Antigen (CA) 125: 60.9 U/mL — ABNORMAL HIGH (ref 0.0–38.1)

## 2019-04-17 NOTE — Assessment & Plan Note (Signed)
Her pancytopenia is stable and she is not symptomatic She will continue neutropenic precaution

## 2019-04-17 NOTE — Assessment & Plan Note (Signed)
She has persistent residual grade 2 peripheral neuropathy that occasionally would bother her Currently, I recommend observation only

## 2019-04-17 NOTE — Progress Notes (Signed)
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 CT ABDOMEN PELVIS W CONTRAST    Standing Status:   Future    Standing Expiration Date:   04/15/2020    Order Specific Question:   If indicated for the ordered procedure, I authorize the administration of contrast media per Radiology protocol    Answer:   Yes    Order Specific Question:   Preferred imaging location?    Answer:   [facility]    Order Specific Question:   Radiology Contrast Protocol - do NOT remove file path    Answer:   [email or web address]    INTERVAL HISTORY: Please see below for problem oriented charting. 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  Surgical consultation is advised.  No findings to suggest metastatic disease in the abdomen/pelvis    02/13/2013 Pathology Results    1. Adnexa - ovary +/- tube, neoplastic - HIGH GRADE OVARIAN POORLY DIFFERENTIATED CARCINOMA WITH ASSOCIATED TUMOR NECROSIS, 11.5 CM, INVOLVING THE OVARIAN CAPSULE AND ADJACENT FALLOPIAN TUBE. - PLEASE SEE ONCOLOGY TEMPLATE FOR DETAILS. 2. Ovary and fallopian tube, left - HIGH GRADE OVARIAN POORLY DIFFERENTIATED CARCINOMA WITH ASSOCIATED TUMOR NECROSIS, 2.3 CM, INVOLVING THE OVARIAN CAPSULE. - FALLOPIAN TUBAL TISSUE, NO EVIDENCE OF MALIGNANCY. - PLEASE SEE ONCOLOGY TEMPLATE FOR DETAILS. 3. Uterus and cervix - ENDOMETRIUM: BENIGN WEAKLY PROLIFERATIVE  ENDOMETRIUM, NO ATYPIA, HYPERPLASIA OR MALIGNANCY. - CERVIX: BENIGN SQUAMOUS MUCOSA AND ENDOCERVICAL MUCOSA, NO DYSPLASIA OR MALIGNANCY. - UTERINE SEROSA: ADHESIONS, NO  EVIDENCE OF ENDOMETRIOSIS, ATYPIA OR MALIGNANCY. 4. Cul-de-sac biopsy - POSITIVE FOR METASTATIC CARCINOMA (2.0 CM). 5. Soft tissue mass, simple excision, peri rectal - POSITIVE FOR METASTATIC CARCINOMA (2.6 CM). 6. Lymph nodes, regional resection, right pelvic - SEVEN LYMPH NODES, NEGATIVE FOR METASTATIC CARCINOMA (0/7). 7. Omentum, resection for tumor - MATURE ADIPOSE TISSUE, NO EVIDENCE OF MALIGNANCY. Microscopic Comment 1. OVARY Specimen(s): Right and left ovaries and fallopian tubes, uterus and cervix Procedure(s): Total hysterectomy, bilateral salpingo-oophorectomy Lymph node sampling performed: Yes Primary tumor site: Right and left ovaries Ovarian surface involvement: Present Maximum tumor size (cm): Right ovary: 11.5 cm; left ovary: 2.3 cm, gross measurement. 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 please see comment. Grade: High grade Peritoneal implants: No Pelvic extension: Cul-de-sac biopsy and perirectal soft tissue biopsy: positive Peritoneal washings: Rare cluster of atypical cells present ([id number]) Lymph nodes: number examined 7 ; number positive 0 TNM code: pT2c, pN0 FIGO Stage (based on pathologic findings, needs clinical correlation): II B (2014 edition) Comments: Sections of bilateral ovaries show sheets and nests of malignant epithelium resembling transitional cell carcinoma with associated tumor necrosis. No evidence of Brenner's tumor or conventional endometrioid carcinoma is identified in the background. The bilateral ovarian capsules are involved. Immunohistochemical stains were performed and the malignant cells are strongly positive for cytokeratin 7, WT-1, p16 and p53, negative for cytokeratin 20. 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 The tumor involves the right fallopian tube. Both  cul-de-sac and perirectal soft tissue biopsies are positive for metastatic carcinoma. The omentum biopsy is negative for tumor.     03/20/2013 - 08/14/2013 Chemotherapy    The patient had 6 cycles of carboplatin and taxol    09/25/2013 Imaging    1. Interval resection of previously described right pelvic mass. No evidence of recurrent or metastatic disease. 2. Small hiatal hernia. 3. Moderate amount of stool within the rectum. Question constipation or even mild fecal impaction.    04/03/2015 PET scan    1. Highly hypermetabolic height bowed dense mass in the medial spleen compatible with malignancy. 2. Bilateral pelvic sidewall adenopathy, right greater than left, hypermetabolic, with small peritoneal implants of tumor in the lower pelvis, and with several faint but suspected omental implants of tumor. There is also a hypermetabolic aortocaval lymph node in the retroperitoneum. 3. Generalized increased thyroid activity could be due to thyroiditis. Although a well-defined nodule is not seen in the right thyroid lobe, there is focal increased hypermetabolic activity in the right thyroid lobe. Thyroid ultrasound is recommended for further investigation. 4. I suspect that the high activity between the spinous processes of L3 and L4 is degenerative.    05/01/2015 - 10/21/2015 Chemotherapy    The patient had 8 cycles of carboplatin, taxol and Avastin.      07/02/2015 Imaging    1. Decrease in size of these splenic lesion. Suspect tumor also involving the adjacent posterior wall of the stomach. 2. Improved pelvic lymphadenopathy. 3. No new omental or peritoneal surface disease.     09/10/2015 Imaging    1. Response to therapy, with further decreased size of a medial splenic lesion. The previously questioned extension into the stomach is no longer identified and may have been artifactual on the prior exam. Of note, an  inferior splenic low-density capsular based lesion is felt to be similar and warrants  followup attention. 2. No adenopathy or new peritoneal disease identified. 3.  Possible constipation. 4.  Advanced aortic and branch vessel atherosclerosis.    11/06/2015 Imaging    1. Mass arising from the medial spleen and involving the posterior wall of stomach demonstrates mild decrease in size in the interval. 2. Interval improvement and previously referenced pathologic iliac lymph nodes. 3. Previously referenced peritoneal implants within the pelvis are no longer measurable. No new areas of peritoneal metastasis is identified. 4. There is a right hilar node which is borderline enlarged but is increased in size when compared with 04/19/2013. Attention on follow-up examination is recommended.    11/18/2015 PET scan    1. Residual hypermetabolic tumor implant between the stomach and spleen, significantly decreased in size and mildly decreased in metabolism compared to 04/03/2015 PET-CT, and slightly decreased in size since 11/06/2015. 2. Residual hypermetabolic right deep pelvic side wall lymph node, with partial treatment response compared to 04/03/2015 PET-CT. 3. No new sites of hypermetabolic metastatic disease. 4. Stable diffuse thyroid hypermetabolism without discrete thyroid nodule, most suggestive of diffuse thyroiditis. Recommend correlation with serum thyroid function tests.     01/28/2016 - 11/26/2016 Anti-estrogen oral therapy    She was placed on Letrozole, stopped due to poor tolerance    02/09/2016 Imaging    1. Continued decrease in size of peritoneal lesion between the posterior wall of stomach and spleen. 2. No new or progressive disease identified    06/23/2016 Imaging    1. Near complete resolution of subtle residual soft tissue density lesion in the gastrosplenic ligament. 2. No new sites of disease identified. 3. Stool burden suggests constipation or even fecal impaction    12/01/2016 Imaging    1. No CT findings for residual or recurrent abdominal/pelvic ovarian  cancer. 2. No acute abdominal/pelvic findings. 3. Large amount of stool throughout the colon and down into the rectum suggesting constipation. 4. Stable atherosclerotic calcifications involving the aorta and iliac arteries.    03/29/2017 - 10/29/2017 Anti-estrogen oral therapy    She was on Aromasin    04/19/2017 Imaging    1. Suspicion of peritoneal recurrence with enlarging implants in the gastrosplenic ligament, left adnexa and posterior to the cecum. No generalized ascites, adenopathy or definite solid organ disease. 2. No evidence of bowel or ureteral obstruction. 3.  Aortic Atherosclerosis (ICD10-I70.0).    06/25/2017 Genetic Testing    Patient has genetic testing done for Breast/ovarian cancer panel Results revealed patient has no detectable mutation    09/01/2017 Tumor Marker    Patient's tumor was tested for the following markers: CA-125 Results of the tumor marker test revealed 40.9    10/29/2017 - 03/24/2018 Anti-estrogen oral therapy    She was on tamoxifen    11/05/2017 Tumor Marker    Patient's tumor was tested for the following markers: CA-125 Results of the tumor marker test revealed 84.4    11/15/2017 Imaging    1. Mild progression of peritoneal metastasis. 2. Possible constipation. No obstruction or other acute complication. 3.  Aortic Atherosclerosis (ICD10-I70.0).    03/21/2018 Tumor Marker    Patient's tumor was tested for the following markers: CA-125 Results of the tumor marker test revealed 101.3.    04/03/2018 Tumor Marker    Patient's tumor was tested for the following markers: CA-125 Results of the tumor marker test revealed 120.9    04/10/2018 - 07/17/2018 Chemotherapy    The  patient had single agent carboplatin x 5 cycles    05/02/2018 Tumor Marker    Patient's tumor was tested for the following markers: CA-125 Results of the tumor marker test revealed 68.2    05/29/2018 Tumor Marker    Patient's tumor was tested for the following markers: CA-125 Results  of the tumor marker test revealed 41.7     Genetic Testing    Patient has genetic testing done for HRD. Results revealed: negative for deleterious BRCA1 or BRCA2 mutations in tumor.     06/26/2018 Tumor Marker    Patient's tumor was tested for the following markers: CA-125 Results of the tumor marker test revealed 29.5    07/14/2018 Imaging    Interval decrease in size of bilateral adnexal masses.  No significant change in size of peritoneal metastasis in the left upper quadrant.  No new or progressive metastatic disease identified.    08/07/2018 - 11/07/2018 Chemotherapy    The patient had zejula    08/14/2018 Tumor Marker    Patient's tumor was tested for the following markers: CA-125 Results of the tumor marker test revealed 31.7    10/16/2018 Tumor Marker    Patient's tumor was tested for the following markers: CA-125 Results of the tumor marker test revealed 49    11/06/2018 Imaging    1. Mild to moderate enlargement of the left upper quadrant lesion along the gastric wall and pancreatic tail suspicious for malignancy. Subtle invasion of the capsule of the spleen as before. 2. Stable soft tissue nodularity along the right adnexa. Slightly reduced soft tissue prominence along the left adnexa. By report the patient has had bilateral salpingo oophorectomy and accordingly these adnexal lesions are concerning for tumor. 3. No new areas of tumor nodularity are identified. 4. Other imaging findings of potential clinical significance: Pelvic floor laxity with small cystocele. Prominent stool throughout the colon favors constipation. Upper normal sized left periaortic lymph node. Bibasilar cylindrical bronchiectasis. Aortic Atherosclerosis (ICD10-I70.0). Coronary atherosclerosis.    11/06/2018 Tumor Marker    Patient's tumor was tested for the following markers: CA-125 Results of the tumor marker test revealed 61.5    11/25/2018 - 01/08/2019 Chemotherapy    The patient had carboplatin and  taxol    12/18/2018 Tumor Marker    Patient's tumor was tested for the following markers: CA-125 Results of the tumor marker test revealed 55.3    01/26/2019 Imaging    3.2 x 4.2 cm low-density lesion along the posterior aspect of the gastric cardia, abutting the splenic hilum and pancreatic tail, suspicious for metastasis. This is stable versus mildly decreased.  2.9 x 2.8 cm mass along the posterior aspect of the cecum (previously described as right adnexal on prior CT), favoring cecal/appendiceal neoplasm, grossly unchanged.  2.6 cm left pelvic/adnexal implant, suspicious for metastasis, mildly increased.  11 mm short axis left para-aortic nodal metastasis, mildly increased.  Low-density splenic lesions along the posterior spleen, new, indeterminate.    01/26/2019 Tumor Marker    Patient's tumor was tested for the following markers: CA-125 Results of the tumor marker test revealed 41.4    02/16/2019 Echocardiogram     1. The left ventricle has hyperdynamic systolic function, with an ejection fraction of >65%. The cavity size was normal. There is mild concentric left ventricular hypertrophy. Left ventricular diastolic Doppler parameters are consistent with impaired relaxation.  2. The right ventricle has normal systolic function. The cavity was normal. There is no increase in right ventricular wall thickness.  3. Mild  thickening of the mitral valve leaflet.    02/19/2019 -  Chemotherapy    The patient had DOXOrubicin HCL LIPOSOMAL (DOXIL)     03/19/2019 Tumor Marker    Patient's tumor was tested for the following markers: CA-125 Results of the tumor marker test revealed 90    04/16/2019 Tumor Marker    Patient's tumor was tested for the following markers: CA-125 Results of the tumor marker test revealed 60.9     REVIEW OF SYSTEMS:   Constitutional: Denies fevers, chills or abnormal weight loss Eyes: Denies blurriness of vision Ears, nose, mouth, throat, and face: Denies  mucositis or sore throat Respiratory: Denies cough, dyspnea or wheezes Cardiovascular: Denies palpitation, chest discomfort or lower extremity swelling Skin: Denies abnormal skin rashes Lymphatics: Denies new lymphadenopathy or easy bruising Neurological:Denies numbness, tingling or new weaknesses Behavioral/Psych: Mood is stable, no new changes  All other systems were reviewed with the patient and are negative.  I have reviewed the past medical history, past surgical history, social history and family history with the patient and they are unchanged from previous note.  ALLERGIES:  is allergic to clonidine derivatives; coreg [carvedilol]; caffeine; codeine; flexeril [cyclobenzaprine]; lipitor [atorvastatin]; lisinopril; pravastatin; tape; tegaderm ag mesh [silver]; and ultram [tramadol].  MEDICATIONS:  Current Outpatient Medications  Medication Sig Dispense Refill  . amiodarone (PACERONE) 200 MG tablet TAKE 1/2 TABLET BY MOUTH DAILY 45 tablet 3  . co-enzyme Q-10 30 MG capsule Take 100 mg by mouth 3 (three) times daily.    Marland Kitchen acetaminophen (TYLENOL) 500 MG tablet Take 1,000 mg by mouth every 6 (six) hours as needed for mild pain.     Marland Kitchen alendronate (FOSAMAX) 70 MG tablet Take 70 mg by mouth as directed. Take with a full glass of water on an empty stomach. Twice Monthly    . amoxicillin (AMOXIL) 500 MG capsule Take 2,000 mg by mouth See admin instructions. Takes 4 capsules 1 hour prior to procedure    . Biotin 10 MG TABS Take 20 mg by mouth daily.    Marland Kitchen desonide (DESOWEN) 0.05 % cream Apply 1 application topically 2 (two) times daily.   1  . diphenhydrAMINE (BENADRYL) 25 MG tablet Take 50 mg by mouth at bedtime as needed for sleep.     Marland Kitchen docusate sodium (COLACE) 100 MG capsule Take 250 mg by mouth 2 (two) times daily.     Marland Kitchen ELIQUIS 5 MG TABS tablet TAKE 1 TABLET BY MOUTH TWICE A DAY 60 tablet 4  . Evolocumab with Infusor 420 MG/3.5ML SOCT Inject into the skin.    Marland Kitchen ezetimibe (ZETIA) 10 MG  tablet Take 1 tablet (10 mg total) by mouth daily. (Patient taking differently: Take 10 mg by mouth at bedtime. ) 30 tablet 6  . Glucosamine HCl 1000 MG TABS Take 1,000 mg by mouth daily.    . hydrocortisone 1 % ointment Apply 1 application topically 2 (two) times daily as needed for itching (eczema).     Marland Kitchen ipratropium (ATROVENT) 0.06 % nasal spray Place 2 sprays into both nostrils daily.   5  . levothyroxine (SYNTHROID) 88 MCG tablet Take 88 mcg by mouth daily before breakfast.    . lidocaine-prilocaine (EMLA) cream Apply to Porta-Cath site 1-2 hours prior to access as directed. 30 g 1  . linaclotide (LINZESS) 145 MCG CAPS capsule Take 145 mcg by mouth daily as needed for constipation.    . magnesium hydroxide (MILK OF MAGNESIA) 800 MG/5ML suspension Take 30 mLs by mouth daily  as needed for constipation. Reported on 11/20/2015    . Melatonin 5 MG CAPS Take 5 mg by mouth at bedtime as needed (sleep). Reported on 12/25/2015    . Menthol, Topical Analgesic, (ICY HOT EX) Apply 1 application topically 2 (two) times daily as needed (PAIN).     . Multiple Vitamin (MULTIVITAMIN WITH MINERALS) TABS tablet Take 1 tablet by mouth daily. Centrum Silver    . ondansetron (ZOFRAN) 8 MG tablet TAKE 1 TABLET (8 MG TOTAL) BY MOUTH EVERY 8 (EIGHT) HOURS AS NEEDED FOR NAUSEA OR VOMITING. 90 tablet 1  . OVER THE COUNTER MEDICATION Co210 20 mg once a day for muscle aches.    Vladimir Faster Glycol-Propyl Glycol (SYSTANE OP) Place 1 drop into both eyes 2 (two) times daily.     . polyethylene glycol (MIRALAX / GLYCOLAX) packet Take 17 g by mouth daily. Mix in 8 oz liquid and drink    . potassium chloride (K-DUR) 10 MEQ tablet Take 10 mEq by mouth daily.    . prochlorperazine (COMPAZINE) 10 MG tablet TAKE 1 TABLET (10 MG TOTAL) BY MOUTH EVERY 6 (SIX) HOURS AS NEEDED (NAUSEA OR VOMITING). 30 tablet 1   No current facility-administered medications for this visit.    Facility-Administered Medications Ordered in Other Visits   Medication Dose Route Frequency Provider Last Rate Last Dose  . sodium chloride flush (NS) 0.9 % injection 10 mL  10 mL Intravenous PRN Joylene John D, NP   10 mL at 01/06/18 1220    PHYSICAL EXAMINATION: ECOG PERFORMANCE STATUS: 1 - Symptomatic but completely ambulatory  Vitals:   04/16/19 1018  BP: (!) 156/83  Pulse: 65  Resp: 18  Temp: 98.5 F (36.9 C)  SpO2: 100%   Filed Weights   04/16/19 1018  Weight: 133 lb 12.8 oz (60.7 kg)    GENERAL:alert, no distress and comfortable SKIN: skin color, texture, turgor are normal, no rashes or significant lesions EYES: normal, Conjunctiva are pink and non-injected, sclera clear OROPHARYNX:no exudate, no erythema and lips, buccal mucosa, and tongue normal  NECK: supple, thyroid normal size, non-tender, without nodularity LYMPH:  no palpable lymphadenopathy in the cervical, axillary or inguinal LUNGS: clear to auscultation and percussion with normal breathing effort HEART: regular rate & rhythm and no murmurs and no lower extremity edema ABDOMEN:abdomen soft, non-tender and normal bowel sounds Musculoskeletal:no cyanosis of digits and no clubbing  NEURO: alert & oriented x 3 with fluent speech, no focal motor/sensory deficits  LABORATORY DATA:  I have reviewed the data as listed    Component Value Date/Time   NA 142 04/16/2019 0948   NA 141 11/11/2017 1145   K 3.5 04/16/2019 0948   K 4.0 11/11/2017 1145   CL 104 04/16/2019 0948   CL 103 05/21/2013 1105   CO2 30 04/16/2019 0948   CO2 29 11/11/2017 1145   GLUCOSE 94 04/16/2019 0948   GLUCOSE 81 11/11/2017 1145   GLUCOSE 74 05/21/2013 1105   BUN 11 04/16/2019 0948   BUN 10.9 11/11/2017 1145   CREATININE 0.74 04/16/2019 0948   CREATININE 0.7 11/11/2017 1145   CALCIUM 9.2 04/16/2019 0948   CALCIUM 9.6 11/11/2017 1145   PROT 6.3 (L) 04/16/2019 0948   PROT 6.5 11/24/2016 1104   ALBUMIN 3.8 04/16/2019 0948   ALBUMIN 3.9 11/24/2016 1104   AST 18 04/16/2019 0948   AST 18  11/24/2016 1104   ALT 14 04/16/2019 0948   ALT 15 11/24/2016 1104   ALKPHOS 77 04/16/2019 0948  ALKPHOS 104 11/24/2016 1104   BILITOT 0.4 04/16/2019 0948   BILITOT 0.57 11/24/2016 1104   GFRNONAA >60 04/16/2019 0948   GFRAA >60 04/16/2019 0948    No results found for: SPEP, UPEP  Lab Results  Component Value Date   WBC 3.3 (L) 04/16/2019   NEUTROABS 1.8 04/16/2019   HGB 10.8 (L) 04/16/2019   HCT 32.9 (L) 04/16/2019   MCV 104.4 (H) 04/16/2019   PLT 122 (L) 04/16/2019      Chemistry      Component Value Date/Time   NA 142 04/16/2019 0948   NA 141 11/11/2017 1145   K 3.5 04/16/2019 0948   K 4.0 11/11/2017 1145   CL 104 04/16/2019 0948   CL 103 05/21/2013 1105   CO2 30 04/16/2019 0948   CO2 29 11/11/2017 1145   BUN 11 04/16/2019 0948   BUN 10.9 11/11/2017 1145   CREATININE 0.74 04/16/2019 0948   CREATININE 0.7 11/11/2017 1145      Component Value Date/Time   CALCIUM 9.2 04/16/2019 0948   CALCIUM 9.6 11/11/2017 1145   ALKPHOS 77 04/16/2019 0948   ALKPHOS 104 11/24/2016 1104   AST 18 04/16/2019 0948   AST 18 11/24/2016 1104   ALT 14 04/16/2019 0948   ALT 15 11/24/2016 1104   BILITOT 0.4 04/16/2019 0948   BILITOT 0.57 11/24/2016 1104       All questions were answered. The patient knows to call the clinic with any problems, questions or concerns. No barriers to learning was detected.  I spent 25 minutes counseling the patient face to face. The total time spent in the appointment was 30 minutes and more than 50% was on counseling and review of test results  Heath Lark, MD 04/17/2019 7:07 AM

## 2019-04-17 NOTE — Assessment & Plan Note (Signed)
She continues to battle with intermittent constipation alternate with passage of loose stool We discussed importance of consistent intake of stool softener and laxatives to avoid constipation

## 2019-04-17 NOTE — Assessment & Plan Note (Signed)
Overall, she tolerated chemotherapy well without major side effects except for pancytopenia and occasional dizziness Her tumor marker fluctuated Clinically, I do not detect any signs of disease progression She is inquiring about molecular markers that was recommended by her GYN surgeon I reviewed with her all the markers that has already been performed and I am not sure what she meant I will inquired further details from her GYN surgeon After cycle 3, I plan to proceed with imaging study for objective assessment of response to therapy.  She agreed

## 2019-04-19 ENCOUNTER — Telehealth: Payer: Self-pay | Admitting: Hematology and Oncology

## 2019-04-19 ENCOUNTER — Telehealth: Payer: Self-pay

## 2019-04-19 NOTE — Telephone Encounter (Signed)
-----   Message from Heath Lark, MD sent at 04/19/2019  9:25 AM EDT ----- Regarding: RE: cataract surgery and dental cleaning I do not recommend routine procedures now as she is mildly pancytopenic ----- Message ----- From: Paulla Dolly, RN Sent: 04/18/2019   1:47 PM EDT To: Heath Lark, MD Subject: cataract surgery and dental cleaning           Pt called and wants to know if to have cataracts removed and have her teeth cleaned.

## 2019-04-19 NOTE — Telephone Encounter (Signed)
Scheduled appt per sch msg. Called and left msg for patient.  °

## 2019-04-19 NOTE — Telephone Encounter (Signed)
Spoke with pt by phone and gave below msg.  

## 2019-04-26 ENCOUNTER — Other Ambulatory Visit: Payer: Self-pay | Admitting: Cardiology

## 2019-05-11 ENCOUNTER — Telehealth: Payer: Self-pay | Admitting: *Deleted

## 2019-05-11 ENCOUNTER — Ambulatory Visit (HOSPITAL_COMMUNITY): Admission: RE | Admit: 2019-05-11 | Payer: Medicare Other | Source: Ambulatory Visit

## 2019-05-11 NOTE — Telephone Encounter (Signed)
I cancelled everything We will call her next week to follow

## 2019-05-11 NOTE — Telephone Encounter (Signed)
Patient called she had to cancel her CT scan today. She has a fever that started yesterday and persisted through the night. She is going to her PCP for COVID testing. She would like to cancel Monday's appts also just in case.

## 2019-05-12 ENCOUNTER — Other Ambulatory Visit: Payer: Self-pay

## 2019-05-12 ENCOUNTER — Encounter (HOSPITAL_COMMUNITY): Payer: Self-pay | Admitting: Emergency Medicine

## 2019-05-12 ENCOUNTER — Emergency Department (HOSPITAL_COMMUNITY)
Admission: EM | Admit: 2019-05-12 | Discharge: 2019-05-12 | Disposition: A | Discharge status: Home / Self Care | Payer: Medicare Other | Attending: Emergency Medicine | Admitting: Emergency Medicine

## 2019-05-12 DIAGNOSIS — E039 Hypothyroidism, unspecified: Secondary | ICD-10-CM | POA: Insufficient documentation

## 2019-05-12 DIAGNOSIS — Z96652 Presence of left artificial knee joint: Secondary | ICD-10-CM | POA: Diagnosis not present

## 2019-05-12 DIAGNOSIS — R509 Fever, unspecified: Secondary | ICD-10-CM | POA: Diagnosis not present

## 2019-05-12 DIAGNOSIS — I2510 Atherosclerotic heart disease of native coronary artery without angina pectoris: Secondary | ICD-10-CM | POA: Insufficient documentation

## 2019-05-12 DIAGNOSIS — Z96642 Presence of left artificial hip joint: Secondary | ICD-10-CM | POA: Diagnosis not present

## 2019-05-12 DIAGNOSIS — Z85828 Personal history of other malignant neoplasm of skin: Secondary | ICD-10-CM | POA: Insufficient documentation

## 2019-05-12 DIAGNOSIS — Z9221 Personal history of antineoplastic chemotherapy: Secondary | ICD-10-CM | POA: Insufficient documentation

## 2019-05-12 DIAGNOSIS — Z79899 Other long term (current) drug therapy: Secondary | ICD-10-CM | POA: Insufficient documentation

## 2019-05-12 DIAGNOSIS — I10 Essential (primary) hypertension: Secondary | ICD-10-CM | POA: Diagnosis not present

## 2019-05-12 DIAGNOSIS — Z20828 Contact with and (suspected) exposure to other viral communicable diseases: Secondary | ICD-10-CM | POA: Diagnosis not present

## 2019-05-12 DIAGNOSIS — Z8543 Personal history of malignant neoplasm of ovary: Secondary | ICD-10-CM | POA: Diagnosis not present

## 2019-05-12 DIAGNOSIS — Z8673 Personal history of transient ischemic attack (TIA), and cerebral infarction without residual deficits: Secondary | ICD-10-CM | POA: Diagnosis not present

## 2019-05-12 LAB — COMPREHENSIVE METABOLIC PANEL
ALT: 26 U/L (ref 0–44)
AST: 29 U/L (ref 15–41)
Albumin: 3.7 g/dL (ref 3.5–5.0)
Alkaline Phosphatase: 86 U/L (ref 38–126)
Anion gap: 9 (ref 5–15)
BUN: 15 mg/dL (ref 8–23)
CO2: 25 mmol/L (ref 22–32)
Calcium: 8.4 mg/dL — ABNORMAL LOW (ref 8.9–10.3)
Chloride: 100 mmol/L (ref 98–111)
Creatinine, Ser: 0.81 mg/dL (ref 0.44–1.00)
GFR calc Af Amer: >60 mL/min (ref >60)
GFR calc non Af Amer: >60 mL/min (ref >60)
Glucose, Bld: 107 mg/dL — ABNORMAL HIGH (ref 70–99)
Potassium: 4 mmol/L (ref 3.5–5.1)
Sodium: 134 mmol/L — ABNORMAL LOW (ref 135–145)
Total Bilirubin: 0.6 mg/dL (ref 0.3–1.2)
Total Protein: 6.3 g/dL — ABNORMAL LOW (ref 6.5–8.1)

## 2019-05-12 LAB — CBC WITH DIFFERENTIAL/PLATELET
Abs Immature Granulocytes: 0.01 10*3/uL (ref 0.00–0.07)
Basophils Absolute: 0 10*3/uL (ref 0.0–0.1)
Basophils Relative: 1 %
Eosinophils Absolute: 0 10*3/uL (ref 0.0–0.5)
Eosinophils Relative: 0 %
HCT: 31.4 % — ABNORMAL LOW (ref 36.0–46.0)
Hemoglobin: 10.3 g/dL — ABNORMAL LOW (ref 12.0–15.0)
Immature Granulocytes: 0 %
Lymphocytes Relative: 14 %
Lymphs Abs: 0.6 10*3/uL — ABNORMAL LOW (ref 0.7–4.0)
MCH: 34.9 pg — ABNORMAL HIGH (ref 26.0–34.0)
MCHC: 32.8 g/dL (ref 30.0–36.0)
MCV: 106.4 fL — ABNORMAL HIGH (ref 80.0–100.0)
Monocytes Absolute: 0.7 10*3/uL (ref 0.1–1.0)
Monocytes Relative: 16 %
Neutro Abs: 2.8 10*3/uL (ref 1.7–7.7)
Neutrophils Relative %: 69 %
Platelets: 126 10*3/uL — ABNORMAL LOW (ref 150–400)
RBC: 2.95 MIL/uL — ABNORMAL LOW (ref 3.87–5.11)
RDW: 14.8 % (ref 11.5–15.5)
WBC: 4.1 10*3/uL (ref 4.0–10.5)
nRBC: 0 % (ref 0.0–0.2)

## 2019-05-12 LAB — URINALYSIS, ROUTINE W REFLEX MICROSCOPIC
Bilirubin Urine: NEGATIVE
Glucose, UA: NEGATIVE mg/dL
Hgb urine dipstick: NEGATIVE
Ketones, ur: NEGATIVE mg/dL
Leukocytes,Ua: NEGATIVE
Nitrite: NEGATIVE
Protein, ur: NEGATIVE mg/dL
Specific Gravity, Urine: 1.009 (ref 1.005–1.030)
pH: 6 (ref 5.0–8.0)

## 2019-05-12 LAB — SARS CORONAVIRUS 2 BY RT PCR (HOSPITAL ORDER, PERFORMED IN ~~LOC~~ HOSPITAL LAB): SARS Coronavirus 2: NEGATIVE

## 2019-05-12 MED ORDER — HEPARIN SOD (PORK) LOCK FLUSH 100 UNIT/ML IV SOLN
500.0000 [IU] | Freq: Once | INTRAVENOUS | Status: AC
  Administered 2019-05-12: 21:00:00 500 [IU]
  Filled 2019-05-12: qty 5

## 2019-05-12 NOTE — ED Triage Notes (Signed)
Pt reports had fevers for past several days that will come down with tylenol but goes back up. Pt reports that she has been tested for Covid but hasnt gotten results yet. Pt states that her doctors think it is related to her chemo. Denies any other symptoms.

## 2019-05-12 NOTE — ED Provider Notes (Signed)
[facility] DEPT Provider Note   CSN: [id number] Arrival date & time: 05/12/19  1801     History   Chief Complaint Chief Complaint  Patient presents with   fever    HPI [name] is a 78 y.o. female.     HPI   77yF with fever. Onset a couple days ago and persistent since. She denies any other significant symptoms. No cough, n/v, urinary complaints, etc. Hx of ovarian CA on chemo. No sick contacts that she is aware of.   Past Medical History:  Diagnosis Date   Arthritis    OSTEOARTHRITIS   -- CONSTANT PAIN RIGHT HIP---AND PAIN LEFT KNEE--PT STATES SHE GETS INJECTIONS INTO HER KNEE   Complication of anesthesia    BLOOD PRESSURE DROPPED WITH NASAL SURGERY, ONE OF THE CARPAL TUNNEL REPAIRS AND DURING A COLONOSCOPY   Dyslipidemia    History of skin cancer    Hypertension    Hypothyroidism    Menopausal symptoms    NSTEMI (non-ST elevated myocardial infarction) ([place]) 12/09/15   Medical management: Distal branch of D1 95%, ostial D2 75%, distal LAD 50%. Tortuous arteries consistent with hypertension   Ovarian cancer ([place]) 01/2013   Recurrence since 2014/2016   PAF (paroxysmal atrial fibrillation) ([place])    On ELIQUIS; On Amiodarone (Eye Exam 12/25/15) - no longer on Rythmol   Stroke ([place]) 2017   no deficits    Patient Active Problem List   Diagnosis Date Noted   Vertigo 01/30/2019   Chemotherapy-induced nausea 08/15/2018   Pancytopenia, acquired ([place]) 05/02/2018   Goals of care, counseling/discussion 04/03/2018   Chronic right-sided low back pain without sciatica 08/29/2017   Closed nondisplaced fracture of head of right radius 08/15/2017   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   Status post revision of total knee replacement, left 03/18/2017   Thrombocytopenia ([place]) 03/27/2016   High risk medications (not anticoagulants) long-term use 03/27/2016   Chronic anticoagulation 03/27/2016    Atherosclerotic heart disease of native coronary artery without angina pectoris 03/03/2016   Bilateral hearing loss 02/16/2016   Bilateral impacted cerumen 02/16/2016   Rhinitis, chronic 30/86/5784   Embolic stroke ([place]) 69/62/9528   Vaginal atrophy 12/29/2015   On amiodarone therapy 12/23/2015   Dyslipidemia, goal LDL below 70    Stroke with cerebral ischemia ([facility])    Visual disturbance    Cerebral thrombosis with cerebral infarction 12/08/2015   NSTEMI (non-ST elevated myocardial infarction) ([place]) 12/04/2015   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   Hypertension due to drug 07/23/2015   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   Portacath in place 07/23/2015   Recurrent carcinoma of ovary ([place]) 07/23/2015   [employer] Federation [employer] Gyne[employer] Ob[employer] (FIGO) 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[place]) 07/23/2015   Genetic testing 07/14/2015   Encounter for antineoplastic chemotherapy 06/25/2015   Port catheter in place 06/25/2015   Antineoplastic chemotherapy induced pancytopenia ([place]) 06/02/2015   Dehydration 06/02/2015   Metastatic cancer to pelvis ([place]) 05/13/2015   Chemotherapy induced neutropenia ([place]) 05/13/2015   Chemotherapy induced thrombocytopenia 05/13/2015   Hyperbilirubinemia 05/06/2015   Osteoarthritis of left knee 01/03/2015   Status post total left knee replacement 01/03/2015   PAF (paroxysmal atrial fibrillation) ([place]): Symptomatic - Rhythm control with Amiodarone. CHA2DS2Vasc Score 7: On Eliquis    Fever 05/03/2013    Class: Acute   Nausea 05/03/2013    Class: Acute   Other constipation 05/03/2013    Class: Acute   Syncope, history of 04/19/2013  Essential hypertension 04/19/2013   Hypothyroidism 04/19/2013   Ovarian cancer on right ([place]) 03/24/2013   Degenerative arthritis of hip 02/25/2012    Past Surgical History:  Procedure Laterality Date   BILATERAL CARPAL TUNNEL REPAIR  2007    BREAST BIOPSY Left 03/19/2014   benign   CARDIAC CATHETERIZATION N/A 12/05/2015   Procedure: Left Heart Cath and Coronary Angiography;  Surgeon: Belva Crome, MD;  Location: [facility];  Service: Cardiovascular;  distal branch of D1 95%, ostial D2 75%, dLAD 50%,p-mRCA 40%. Tortuous vessels.   DILATION AND CURETTAGE OF UTERUS  1969   JOINT REPLACEMENT     LAPAROTOMY Bilateral 02/13/2013   Procedure: EXPLORATORY LAPAROTOMY TOTAL ABDOMINAL HYSTERECTOMY BILATERAL SALPINGO-OOPHORECTOMY, Partial Rectal Resection with Reanastamosis;  Surgeon: Alvino Chapel, MD;  Location: [facility];  Service: Gynecology;  Laterality: Bilateral;   LEFT KNEE ARTHROSCOPY   2011   LYMPHADENECTOMY Right 02/13/2013   Procedure: PEVLIC  LYMPHADENECTOMY, DEBULKING right pelvic tumor nodules;  Surgeon: Alvino Chapel, MD;  Location: [facility];  Service: Gynecology;  Laterality: Right;   NM MYOVIEW LTD  June 2010    subbmaximal with no ischemia or infarction.   OMENTECTOMY  02/13/2013   Procedure: OMENTECTOMY;  Surgeon: Alvino Chapel, MD;  Location: [facility];  Service: Gynecology;;   RHINOPLASTY FOR FRACTURED NOSE  1986   SURGERY FOR RUPTURED OVARIAN CYST  1969   SYNOVECTOMY WITH POLY EXCHANGE Left 03/18/2017   Procedure: LEFT KNEE PARTIAL SYNOVECTOMY WITH POLY EXCHANGE;  Surgeon: Mcarthur Rossetti, MD;  Location: [facility];  Service: Orthopedics;  Laterality: Left;  Adductor Block   TAH/BSO/Tumor debulking with right pelvic LND  01/2013   TONSILLECTOMY  1962   TOTAL HIP ARTHROPLASTY  02/25/2012   Procedure: TOTAL HIP ARTHROPLASTY ANTERIOR APPROACH;  Surgeon: Mcarthur Rossetti, MD;  Location: [facility];  Service: Orthopedics;  Laterality: Right;   TOTAL KNEE ARTHROPLASTY Left 01/03/2015   Procedure: LEFT TOTAL KNEE ARTHROPLASTY;  Surgeon: Mcarthur Rossetti, MD;  Location: [facility];  Service: Orthopedics;  Laterality: Left;   TRANSTHORACIC ECHOCARDIOGRAM  May 2014; January 2016   a.  Normal LV size function. EF 60-65%. Grade 1 diastolic function. Mild MR and mildly elevated PA pressures of 37 mmHg per;; b. EF 60-65%. Mobile echodensity 10 mm x 6 mm attest interventricular septum is questionable fibroblastoma.   TRIGGER FINGER RELEASE Left 12/20/2017   Procedure: RELEASE TRIGGER FINGER/A-1 PULLEY LEFT RING FINGER;  Surgeon: Daryll Brod, MD;  Location: [facility];  Service: Orthopedics;  Laterality: Left;     OB History    Gravida  3   Para  2   Term      Preterm      AB  1   Living  3     SAB  1   TAB      Ectopic      Multiple  1   Live Births               Home Medications    Prior to Admission medications   Medication Sig Start Date End Date Taking? Authorizing Provider  acetaminophen (TYLENOL) 500 MG tablet Take 1,000 mg by mouth every 6 (six) hours as needed for mild pain.     [provider]  alendronate (FOSAMAX) 70 MG tablet Take 70 mg by mouth as directed. Take with a full glass of water on an empty stomach. Twice Monthly    [provider]  amiodarone (  PACERONE) 200 MG tablet TAKE 1/2 TABLET BY MOUTH DAILY 02/16/19   Leonie Man, MD  amoxicillin (AMOXIL) 500 MG capsule Take 2,000 mg by mouth See admin instructions. Takes 4 capsules 1 hour prior to procedure    [provider]  Biotin 10 MG TABS Take 20 mg by mouth daily.    [provider]  co-enzyme Q-10 30 MG capsule Take 100 mg by mouth 3 (three) times daily.    [provider]  desonide (DESOWEN) 0.05 % cream Apply 1 application topically 2 (two) times daily.  12/23/14   [provider]  diphenhydrAMINE (BENADRYL) 25 MG tablet Take 50 mg by mouth at bedtime as needed for sleep.     [provider]  docusate sodium (COLACE) 100 MG capsule Take 250 mg by mouth 2 (two) times daily.     [provider]  ELIQUIS 5 MG TABS tablet TAKE 1 TABLET BY MOUTH TWICE A DAY 04/26/19   Leonie Man, MD    Evolocumab with Infusor 420 MG/3.5ML SOCT Inject into the skin.    [provider]  ezetimibe (ZETIA) 10 MG tablet Take 1 tablet (10 mg total) by mouth daily. Patient taking differently: Take 10 mg by mouth at bedtime.  02/24/16   Leonie Man, MD  Glucosamine HCl 1000 MG TABS Take 1,000 mg by mouth daily.    [provider]  hydrocortisone 1 % ointment Apply 1 application topically 2 (two) times daily as needed for itching (eczema).     [provider]  ipratropium (ATROVENT) 0.06 % nasal spray Place 2 sprays into both nostrils daily.  02/16/16   [provider]  levothyroxine (SYNTHROID) 88 MCG tablet Take 88 mcg by mouth daily before breakfast.    [provider]  lidocaine-prilocaine (EMLA) cream Apply to Porta-Cath site 1-2 hours prior to access as directed. 03/24/18   Joylene John D, NP  linaclotide (LINZESS) 145 MCG CAPS capsule Take 145 mcg by mouth daily as needed for constipation.    [provider]  magnesium hydroxide (MILK OF MAGNESIA) 800 MG/5ML suspension Take 30 mLs by mouth daily as needed for constipation. Reported on 11/20/2015    [provider]  Melatonin 5 MG CAPS Take 5 mg by mouth at bedtime as needed (sleep). Reported on 12/25/2015    [provider]  Menthol, Topical Analgesic, (ICY HOT EX) Apply 1 application topically 2 (two) times daily as needed (PAIN).     [provider]  Multiple Vitamin (MULTIVITAMIN WITH MINERALS) TABS tablet Take 1 tablet by mouth daily. Centrum Silver    [provider]  ondansetron (ZOFRAN) 8 MG tablet TAKE 1 TABLET (8 MG TOTAL) BY MOUTH EVERY 8 (EIGHT) HOURS AS NEEDED FOR NAUSEA OR VOMITING. 12/11/18   Heath Lark, MD  OVER THE COUNTER MEDICATION Co210 20 mg once a day for muscle aches.    [provider]  Polyethyl Glycol-Propyl Glycol (SYSTANE OP) Place 1 drop into both eyes 2 (two) times daily.     [provider]  polyethylene  glycol (MIRALAX / GLYCOLAX) packet Take 17 g by mouth daily. Mix in 8 oz liquid and drink    [provider]  potassium chloride (K-DUR) 10 MEQ tablet Take 10 mEq by mouth daily. 05/05/13   Shon Baton, MD  prochlorperazine (COMPAZINE) 10 MG tablet TAKE 1 TABLET (10 MG TOTAL) BY MOUTH EVERY 6 (SIX) HOURS AS NEEDED (NAUSEA OR VOMITING). 03/05/19   Heath Lark, MD  Family History Family History  Problem Relation Age of Onset   Hypertension Mother    Basal cell carcinoma Mother    AAA (abdominal aortic aneurysm) Father    Heart Problems Maternal Uncle    Heart Problems Maternal Grandfather    AAA (abdominal aortic aneurysm) Paternal Grandmother    Prostate cancer Maternal Uncle 74   Prostate cancer Other    Other Daughter        one daughter had TAH-BSO at 58; other daughter will have one soon    Social History Social History   Tobacco Use   Smoking status: Never Smoker   Smokeless tobacco: Never Used  Substance Use Topics   Alcohol use: No    Alcohol/week: 0.0 standard drinks   Drug use: No     Allergies   Clonidine derivatives, Coreg [carvedilol], Caffeine, Codeine, Flexeril [cyclobenzaprine], Lipitor [atorvastatin], Lisinopril, Pravastatin, Tape, Tegaderm ag mesh [silver], and Ultram [tramadol]   Review of Systems Review of Systems  All systems reviewed and negative, other than as noted in HPI.  Physical Exam Updated Vital Signs BP 138/78 (BP Location: Left Arm)    Pulse 96    Temp 99.9 F (37.7 C) (Oral)    Resp 18    SpO2 99%   Physical Exam Vitals signs and nursing note reviewed.  Constitutional:      General: She is not in acute distress.    Appearance: She is well-developed.  HENT:     Head: Normocephalic and atraumatic.  Eyes:     General:        Right eye: No discharge.        Left eye: No discharge.     Conjunctiva/sclera: Conjunctivae normal.  Neck:     Musculoskeletal: Neck supple.  Cardiovascular:     Rate and Rhythm: Normal  rate and regular rhythm.     Heart sounds: Normal heart sounds. No murmur. No friction rub. No gallop.   Pulmonary:     Effort: Pulmonary effort is normal. No respiratory distress.     Breath sounds: Normal breath sounds.  Abdominal:     General: There is no distension.     Palpations: Abdomen is soft.     Tenderness: There is no abdominal tenderness.  Musculoskeletal:        General: No tenderness.  Skin:    General: Skin is warm and dry.  Neurological:     Mental Status: She is alert.  Psychiatric:        Behavior: Behavior normal.        Thought Content: Thought content normal.      ED Treatments / Results  Labs (all labs ordered are listed, but only abnormal results are displayed) Labs Reviewed  CBC WITH DIFFERENTIAL/PLATELET - Abnormal; Notable for the following components:      Result Value   RBC 2.95 (*)    Hemoglobin 10.3 (*)    HCT 31.4 (*)    MCV 106.4 (*)    MCH 34.9 (*)    Platelets 126 (*)    Lymphs Abs 0.6 (*)    All other components within normal limits  COMPREHENSIVE METABOLIC PANEL - Abnormal; Notable for the following components:   Sodium 134 (*)    Glucose, Bld 107 (*)    Calcium 8.4 (*)    Total Protein 6.3 (*)    All other components within normal limits  URINE CULTURE  CULTURE, BLOOD (ROUTINE X 2)  CULTURE, BLOOD (ROUTINE X 2)  SARS CORONAVIRUS  2 (HOSPITAL ORDER, [facility])  URINALYSIS, ROUTINE W REFLEX MICROSCOPIC    EKG    Radiology Dg Chest Port 1 View  Result Date: 05/13/2019 CLINICAL DATA:  Fever. Atrial fibrillation. Previous myocardial infarct. EXAM: PORTABLE CHEST 1 VIEW COMPARISON:  06/04/2016 FINDINGS: The heart size and mediastinal contours are within normal limits. Right-sided Port-A-Cath remains in appropriate position. Aortic atherosclerosis. Both lungs are clear. The visualized skeletal structures are unremarkable. IMPRESSION: No active disease. Electronically Signed   By: Earle Gell M.D.   On:  05/13/2019 11:38    Procedures Procedures (including critical care time)  Medications Ordered in ED Medications - No data to display   Initial Impression / Assessment and Plan / ED Course  I have reviewed the triage vital signs and the nursing notes.  Pertinent labs & imaging results that were available during my care of the patient were reviewed by me and considered in my medical decision making (see chart for details).    77yF with fever. Really no other significant symptoms. She appears very well. W/u fairly unremarkable with regards to her baseline. COVID neg. Urine and blood cultures obtained. Return precautions discussed. Plan symptomatic tx otherwise at this time. May need more extensive w/u if fever persists beyond a few more days.   [name] was evaluated in Emergency Department on 05/14/2019 for the symptoms described in the history of present illness. She was evaluated in the context of the global COVID-19 pandemic, which necessitated consideration that the patient might be at risk for infection with the SARS-CoV-2 virus that causes COVID-19. Institutional protocols and algorithms that pertain to the evaluation of patients at risk for COVID-19 are in a state of rapid change based on information released by regulatory bodies including the CDC and federal and state organizations. These policies and algorithms were followed during the patient's care in the ED.  Final Clinical Impressions(s) / ED Diagnoses   Final diagnoses:  Fever, unspecified fever cause    ED Discharge Orders    None       Virgel Manifold, MD 05/14/19 1650

## 2019-05-12 NOTE — ED Triage Notes (Signed)
Last dose of Tylenol was around 5pm.

## 2019-05-13 ENCOUNTER — Emergency Department (HOSPITAL_COMMUNITY)
Admission: EM | Admit: 2019-05-13 | Discharge: 2019-05-13 | Disposition: A | Discharge status: Home / Self Care | Payer: Medicare Other | Attending: Emergency Medicine | Admitting: Emergency Medicine

## 2019-05-13 ENCOUNTER — Encounter (HOSPITAL_COMMUNITY): Payer: Self-pay | Admitting: Emergency Medicine

## 2019-05-13 ENCOUNTER — Other Ambulatory Visit: Payer: Self-pay

## 2019-05-13 ENCOUNTER — Emergency Department (HOSPITAL_COMMUNITY): Payer: Medicare Other

## 2019-05-13 DIAGNOSIS — C569 Malignant neoplasm of unspecified ovary: Secondary | ICD-10-CM | POA: Diagnosis not present

## 2019-05-13 DIAGNOSIS — E039 Hypothyroidism, unspecified: Secondary | ICD-10-CM | POA: Insufficient documentation

## 2019-05-13 DIAGNOSIS — Z96641 Presence of right artificial hip joint: Secondary | ICD-10-CM | POA: Insufficient documentation

## 2019-05-13 DIAGNOSIS — Z85828 Personal history of other malignant neoplasm of skin: Secondary | ICD-10-CM | POA: Diagnosis not present

## 2019-05-13 DIAGNOSIS — Z8673 Personal history of transient ischemic attack (TIA), and cerebral infarction without residual deficits: Secondary | ICD-10-CM | POA: Insufficient documentation

## 2019-05-13 DIAGNOSIS — Z79899 Other long term (current) drug therapy: Secondary | ICD-10-CM | POA: Insufficient documentation

## 2019-05-13 DIAGNOSIS — I4891 Unspecified atrial fibrillation: Secondary | ICD-10-CM

## 2019-05-13 DIAGNOSIS — Z96652 Presence of left artificial knee joint: Secondary | ICD-10-CM | POA: Diagnosis not present

## 2019-05-13 DIAGNOSIS — I252 Old myocardial infarction: Secondary | ICD-10-CM | POA: Insufficient documentation

## 2019-05-13 DIAGNOSIS — Z7901 Long term (current) use of anticoagulants: Secondary | ICD-10-CM | POA: Diagnosis not present

## 2019-05-13 DIAGNOSIS — I480 Paroxysmal atrial fibrillation: Secondary | ICD-10-CM | POA: Diagnosis not present

## 2019-05-13 DIAGNOSIS — R000 Tachycardia, unspecified: Secondary | ICD-10-CM | POA: Diagnosis present

## 2019-05-13 DIAGNOSIS — I10 Essential (primary) hypertension: Secondary | ICD-10-CM | POA: Insufficient documentation

## 2019-05-13 LAB — CBC
HCT: 28.6 % — ABNORMAL LOW (ref 36.0–46.0)
Hemoglobin: 9.5 g/dL — ABNORMAL LOW (ref 12.0–15.0)
MCH: 34.3 pg — ABNORMAL HIGH (ref 26.0–34.0)
MCHC: 33.2 g/dL (ref 30.0–36.0)
MCV: 103.2 fL — ABNORMAL HIGH (ref 80.0–100.0)
Platelets: 112 10*3/uL — ABNORMAL LOW (ref 150–400)
RBC: 2.77 MIL/uL — ABNORMAL LOW (ref 3.87–5.11)
RDW: 14.9 % (ref 11.5–15.5)
WBC: 4.4 10*3/uL (ref 4.0–10.5)
nRBC: 0 % (ref 0.0–0.2)

## 2019-05-13 LAB — URINALYSIS, ROUTINE W REFLEX MICROSCOPIC
Bilirubin Urine: NEGATIVE
Glucose, UA: NEGATIVE mg/dL
Hgb urine dipstick: NEGATIVE
Ketones, ur: 5 mg/dL — AB
Leukocytes,Ua: NEGATIVE
Nitrite: NEGATIVE
Protein, ur: 100 mg/dL — AB
Specific Gravity, Urine: 1.005 (ref 1.005–1.030)
pH: 7 (ref 5.0–8.0)

## 2019-05-13 LAB — COMPREHENSIVE METABOLIC PANEL
ALT: 22 U/L (ref 0–44)
AST: 25 U/L (ref 15–41)
Albumin: 2.9 g/dL — ABNORMAL LOW (ref 3.5–5.0)
Alkaline Phosphatase: 81 U/L (ref 38–126)
Anion gap: 8 (ref 5–15)
BUN: 8 mg/dL (ref 8–23)
CO2: 26 mmol/L (ref 22–32)
Calcium: 8.3 mg/dL — ABNORMAL LOW (ref 8.9–10.3)
Chloride: 100 mmol/L (ref 98–111)
Creatinine, Ser: 0.76 mg/dL (ref 0.44–1.00)
GFR calc Af Amer: >60 mL/min (ref >60)
GFR calc non Af Amer: >60 mL/min (ref >60)
Glucose, Bld: 97 mg/dL (ref 70–99)
Potassium: 3.8 mmol/L (ref 3.5–5.1)
Sodium: 134 mmol/L — ABNORMAL LOW (ref 135–145)
Total Bilirubin: 0.7 mg/dL (ref 0.3–1.2)
Total Protein: 5.4 g/dL — ABNORMAL LOW (ref 6.5–8.1)

## 2019-05-13 MED ORDER — SODIUM CHLORIDE 0.9% FLUSH: 10.0000 mL | INTRAVENOUS | Status: DC | PRN

## 2019-05-13 MED ORDER — SODIUM CHLORIDE 0.9 % IV BOLUS
1000.0000 mL | Freq: Once | INTRAVENOUS | Status: AC
  Administered 2019-05-13: 1000 mL via INTRAVENOUS

## 2019-05-13 NOTE — ED Provider Notes (Addendum)
[facility] EMERGENCY DEPARTMENT Provider Note   CSN: [id number] Arrival date & time: 05/13/19  7893     History   Chief Complaint Chief Complaint  Patient presents with  . Atrial Fibrillation    HPI [name] is a 78 y.o. female.     HPI  78 year old female presents today complaining of rapid heartbeat and atrial fibrillation.  Patient is currently being treated for ovarian cancer with chemotherapy last given the 18th.  White blood cell count yesterday was 4100.  Being treated for ovarian cancer for 6 years.  She reports that she has been running a fever for the past several days.  She was seen yesterday at was in emergency department.  She reports that she has had intermittent fever but denies any other symptoms.  She states that they found no source yesterday.  She has had decreased p.o. intake which she attributes to being in the ED and then going home late not taking much by mouth.  She does report some fluids since then and crackers from the machine yesterday.  Also reports that she had an explosive bowel movement this morning.  Denies any chest pain, cough, headache, neck pain, abdominal pain, urinary frequency or dysuria.  Review of her records reveal that she has been no growth yet  from blood cultures or urine cultures which were started yesterday. Covid test negative yesterday yesterday Oncologist is Dr.Gorsuch -primary care is Dr. Virgina Jock  Past Medical History:  Diagnosis Date  . Arthritis    OSTEOARTHRITIS   -- CONSTANT PAIN RIGHT HIP---AND PAIN LEFT KNEE--PT STATES SHE GETS INJECTIONS INTO HER KNEE  . Complication of anesthesia    BLOOD PRESSURE DROPPED WITH NASAL SURGERY, ONE OF THE CARPAL TUNNEL REPAIRS AND DURING A COLONOSCOPY  . Dyslipidemia   . History of skin cancer   . Hypertension   . Hypothyroidism   . Menopausal symptoms   . NSTEMI (non-ST elevated myocardial infarction) ([place]) 12/09/15   Medical management: Distal branch of D1 95%,  ostial D2 75%, distal LAD 50%. Tortuous arteries consistent with hypertension  . Ovarian cancer ([place]) 01/2013   Recurrence since 2014/2016  . PAF (paroxysmal atrial fibrillation) ([place])    On ELIQUIS; On Amiodarone (Eye Exam 12/25/15) - no longer on Rythmol  . Stroke [facility]) 2017   no deficits    Patient Active Problem List   Diagnosis Date Noted  . Vertigo 01/30/2019  . Chemotherapy-induced nausea 08/15/2018  . Pancytopenia, acquired ([place]) 05/02/2018  . Goals of care, counseling/discussion 04/03/2018  . Chronic right-sided low back pain without sciatica 08/29/2017  . Closed nondisplaced fracture of head of right radius 08/15/2017  . 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 Status post revision of total knee replacement, left 03/18/2017  . Thrombocytopenia ([place]) 03/27/2016  . High risk medications (not anticoagulants) long-term use 03/27/2016  . Chronic anticoagulation 03/27/2016  . Atherosclerotic heart disease of native coronary artery without angina pectoris 03/03/2016  . Bilateral hearing loss 02/16/2016  . Bilateral impacted cerumen 02/16/2016  . Rhinitis, chronic 02/16/2016  . Embolic stroke ([place]) 81/11/7508  . Vaginal atrophy 12/29/2015  . On amiodarone therapy 12/23/2015  . Dyslipidemia, goal LDL below 70   . Stroke with cerebral ischemia ([place])   . Visual disturbance   . Cerebral thrombosis with cerebral infarction 12/08/2015  . NSTEMI (non-ST elevated myocardial infarction) ([place]) 12/04/2015  . 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 Chemotherapy-induced peripheral neuropathy (  [place]) 07/23/2015  . Portacath in place 07/23/2015  . Recurrent carcinoma of ovary ([place]) 07/23/2015  . [employer] Federation of Gyne[employer] and Obstetrics (FIGO) 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[place]) 07/23/2015  . Genetic testing 07/14/2015  . Encounter for antineoplastic chemotherapy 06/25/2015  . Port catheter in place 06/25/2015  .  Antineoplastic chemotherapy induced pancytopenia ([place]) 06/02/2015  . Dehydration 06/02/2015  . 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 Chemotherapy induced neutropenia ([place]) 05/13/2015  . Chemotherapy induced thrombocytopenia 05/13/2015  . Hyperbilirubinemia 05/06/2015  . Osteoarthritis of left knee 01/03/2015  . Status post total left knee replacement 01/03/2015  . PAF (paroxysmal atrial fibrillation) ([place]): Symptomatic - Rhythm control with Amiodarone. CHA2DS2Vasc Score 7: On Eliquis   . Fever 05/03/2013    Class: Acute  . Nausea 05/03/2013    Class: Acute  . Other constipation 05/03/2013    Class: Acute  . Syncope, history of 04/19/2013  . Essential hypertension 04/19/2013  . Hypothyroidism 04/19/2013  . Ovarian cancer on right ([place]) 03/24/2013  . Degenerative arthritis of hip 02/25/2012    Past Surgical History:  Procedure Laterality Date  . BILATERAL CARPAL TUNNEL REPAIR  2007  . BREAST BIOPSY Left 03/19/2014   benign  . CARDIAC CATHETERIZATION N/A 12/05/2015   Procedure: Left Heart Cath and Coronary Angiography;  Surgeon: Belva Crome, MD;  Location: [facility];  Service: Cardiovascular;  distal branch of D1 95%, ostial D2 75%, dLAD 50%,p-mRCA 40%. Tortuous vessels.  Marland Kitchen DILATION AND CURETTAGE OF UTERUS  1969  . JOINT REPLACEMENT    . LAPAROTOMY Bilateral 02/13/2013   Procedure: EXPLORATORY LAPAROTOMY TOTAL ABDOMINAL HYSTERECTOMY BILATERAL SALPINGO-OOPHORECTOMY, Partial Rectal Resection with Reanastamosis;  Surgeon: Alvino Chapel, MD;  Location: [facility];  Service: Gynecology;  Laterality: Bilateral;  . LEFT KNEE ARTHROSCOPY   2011  . LYMPHADENECTOMY Right 02/13/2013   Procedure: PEVLIC  LYMPHADENECTOMY, DEBULKING right pelvic tumor nodules;  Surgeon: Alvino Chapel, MD;  Location: [facility];  Service: Gynecology;  Laterality: Right;  . NM MYOVIEW LTD  June 2010    subbmaximal with no ischemia or infarction.  . OMENTECTOMY  02/13/2013   Procedure:  OMENTECTOMY;  Surgeon: Alvino Chapel, MD;  Location: [facility];  Service: Gynecology;;  . [place]  . SURGERY FOR RUPTURED OVARIAN CYST  1969  . SYNOVECTOMY WITH POLY EXCHANGE Left 03/18/2017   Procedure: LEFT KNEE PARTIAL SYNOVECTOMY WITH POLY EXCHANGE;  Surgeon: Mcarthur Rossetti, MD;  Location: [facility];  Service: Orthopedics;  Laterality: Left;  Adductor Block  . TAH/BSO/Tumor debulking with right pelvic LND  01/2013  . TONSILLECTOMY  1962  . TOTAL HIP ARTHROPLASTY  02/25/2012   Procedure: TOTAL HIP ARTHROPLASTY ANTERIOR APPROACH;  Surgeon: Mcarthur Rossetti, MD;  Location: [facility];  Service: Orthopedics;  Laterality: Right;  . TOTAL KNEE ARTHROPLASTY Left 01/03/2015   Procedure: LEFT TOTAL KNEE ARTHROPLASTY;  Surgeon: Mcarthur Rossetti, MD;  Location: [facility];  Service: Orthopedics;  Laterality: Left;  . TRANSTHORACIC ECHOCARDIOGRAM  May 2014; January 2016   a. Normal LV size function. EF 60-65%. Grade 1 diastolic function. Mild MR and mildly elevated PA pressures of 37 mmHg per;; b. EF 60-65%. Mobile echodensity 10 mm x 6 mm attest interventricular septum is questionable fibroblastoma.  Marland Kitchen TRIGGER FINGER RELEASE Left 12/20/2017   Procedure: RELEASE TRIGGER FINGER/A-1 PULLEY LEFT RING FINGER;  Surgeon: Daryll Brod, MD;  Location: [facility];  Service: Orthopedics;  Laterality: Left;  OB History    Gravida  3   Para  2   Term      Preterm      AB  1   Living  3     SAB  1   TAB      Ectopic      Multiple  1   Live Births               Home Medications    Prior to Admission medications   Medication Sig Start Date End Date Taking? Authorizing Provider  acetaminophen (TYLENOL) 500 MG tablet Take 1,000 mg by mouth every 6 (six) hours as needed for mild pain.     [provider]  alendronate (FOSAMAX) 70 MG tablet Take 70 mg by mouth See admin instructions. Take with a full glass of water on an empty  stomach. Twice Monthly    [provider]  amiodarone (PACERONE) 200 MG tablet TAKE 1/2 TABLET BY MOUTH DAILY Patient taking differently: Take 100 mg by mouth daily.  02/16/19   Leonie Man, MD  Biotin 10 MG TABS Take 20 mg by mouth daily.    [provider]  co-enzyme Q-10 30 MG capsule Take 100 mg by mouth daily.     [provider]  desonide (DESOWEN) 0.05 % cream Apply 1 application topically 2 (two) times daily.  12/23/14   [provider]  diphenhydrAMINE (BENADRYL) 25 MG tablet Take 50 mg by mouth at bedtime as needed for sleep.     [provider]  docusate sodium (COLACE) 100 MG capsule Take 250 mg by mouth 2 (two) times daily.     [provider]  ELIQUIS 5 MG TABS tablet TAKE 1 TABLET BY MOUTH TWICE A DAY Patient taking differently: Take 5 mg by mouth 2 (two) times daily.  04/26/19   Leonie Man, MD  Evolocumab with Infusor 420 MG/3.5ML SOCT Inject 420 mg into the skin every 30 (thirty) days.     [provider]  ezetimibe (ZETIA) 10 MG tablet Take 1 tablet (10 mg total) by mouth daily. Patient taking differently: Take 10 mg by mouth at bedtime.  02/24/16   Leonie Man, MD  Glucosamine HCl 1000 MG TABS Take 1,000 mg by mouth daily.    [provider]  hydrocortisone 1 % ointment Apply 1 application topically 2 (two) times daily as needed for itching (eczema).     [provider]  ipratropium (ATROVENT) 0.06 % nasal spray Place 2 sprays into both nostrils daily.  02/16/16   [provider]  lidocaine-prilocaine (EMLA) cream Apply to Porta-Cath site 1-2 hours prior to access as directed. 03/24/18   Cross, Lenna Sciara D, NP  magnesium hydroxide (MILK OF MAGNESIA) 800 MG/5ML suspension Take 30 mLs by mouth daily as needed for constipation. Reported on 11/20/2015    [provider]  Melatonin 5 MG CAPS Take 5 mg by mouth at bedtime as needed (sleep). Reported on 12/25/2015    [provider]  Menthol, Topical Analgesic, (ICY HOT EX) Apply 1 application topically 2 (two) times daily as needed (PAIN).     [provider]  Multiple Vitamin (MULTIVITAMIN WITH MINERALS) TABS tablet Take 1 tablet by mouth daily. Centrum Silver    [provider]  ondansetron (ZOFRAN) 8 MG tablet TAKE 1 TABLET (8 MG TOTAL) BY MOUTH EVERY 8 (EIGHT) HOURS AS NEEDED FOR NAUSEA OR VOMITING. Patient taking differently: Take 8 mg by mouth  daily as needed for nausea or vomiting.  12/11/18   Heath Lark, MD  Polyethyl Glycol-Propyl Glycol (SYSTANE OP) Place 1 drop into both eyes 2 (two) times daily.     [provider]  polyethylene glycol (MIRALAX / GLYCOLAX) packet Take 17 g by mouth 2 (two) times a day. Mix in 8 oz liquid and drink    [provider]  potassium chloride (K-DUR) 10 MEQ tablet Take 10 mEq by mouth daily. 03/05/19   [provider]  prochlorperazine (COMPAZINE) 10 MG tablet TAKE 1 TABLET (10 MG TOTAL) BY MOUTH EVERY 6 (SIX) HOURS AS NEEDED (NAUSEA OR VOMITING). Patient taking differently: Take 10 mg by mouth daily as needed (Nausea or vomiting).  03/05/19   Heath Lark, MD  SYNTHROID 88 MCG tablet Take 88 mcg by mouth daily. 05/06/19   [provider]    Family History Family History  Problem Relation Age of Onset  . Hypertension Mother   . Basal cell carcinoma Mother   . AAA (abdominal aortic aneurysm) Father   . Heart Problems Maternal Uncle   . Heart Problems Maternal Grandfather   . AAA (abdominal aortic aneurysm) Paternal Grandmother   . Prostate cancer Maternal Uncle 70  . Prostate cancer Other   . Other Daughter        one daughter had TAH-BSO at 13; other daughter will have one soon    Social History Social History   Tobacco Use  . Smoking status: Never Smoker  . Smokeless tobacco: Never Used  Substance Use Topics  . Alcohol use: No    Alcohol/week: 0.0 standard drinks  . Drug use: No     Allergies    Clonidine derivatives, Coreg [carvedilol], Augmentin [amoxicillin-pot clavulanate], Caffeine, Codeine, Flexeril [cyclobenzaprine], Lipitor [atorvastatin], Lisinopril, Pravastatin, Tape, Tegaderm ag mesh [silver], and Ultram [tramadol]   Review of Systems Review of Systems   Physical Exam Updated Vital Signs BP 103/76 (BP Location: Right Arm)   Pulse (!) 145   Temp 97.6 F (36.4 C) (Oral)   Resp (!) 22   Ht 1.6 m (5\' 3" )   Wt 58.5 kg   SpO2 100%   BMI 22.85 kg/m   Physical Exam Vitals signs and nursing note reviewed.  Constitutional:      General: She is not in acute distress.    Appearance: Normal appearance. She is not ill-appearing.  HENT:     Head: Normocephalic.     Right Ear: External ear normal.     Left Ear: External ear normal.     Nose: Nose normal.     Mouth/Throat:     Mouth: Mucous membranes are moist.     Pharynx: Oropharynx is clear.  Eyes:     Pupils: Pupils are equal, round, and reactive to light.  Neck:     Musculoskeletal: Normal range of motion.  Cardiovascular:     Rate and Rhythm: Tachycardia present. Rhythm regularly irregular.     Pulses: Normal pulses.     Heart sounds: Normal heart sounds.  Pulmonary:     Effort: Pulmonary effort is normal.     Breath sounds: Normal breath sounds.  Abdominal:     General: Abdomen is flat. Bowel sounds are normal.     Palpations: Abdomen is soft.  Musculoskeletal: Normal range of motion.        General: No swelling or tenderness.     Right lower leg: No edema.     Left lower leg: No edema.  Skin:  General: Skin is warm and dry.     Capillary Refill: Capillary refill takes less than 2 seconds.  Neurological:     General: No focal deficit present.     Mental Status: She is alert.     Cranial Nerves: No cranial nerve deficit.     Sensory: No sensory deficit.     Motor: No weakness.     Coordination: Coordination normal.     Gait: Gait normal.  Psychiatric:        Mood and Affect: Mood normal.       ED Treatments / Results  Labs (all labs ordered are listed, but only abnormal results are displayed) Labs Reviewed - No data to display  EKG EKG Interpretation  Date/Time:  Sunday May 13 2019 10:08:20 EDT Ventricular Rate:  148 PR Interval:    QRS Duration: 122 QT Interval:  294 QTC Calculation: 462 R Axis:   12 Text Interpretation:  Atrial fibrillation Non-specific intra-ventricular conduction delay Confirmed by Pattricia Boss [phone]) on 05/13/2019 10:32:40 AM  ED ECG REPORT   Date: 05/13/2019  Rate: 90  Rhythm: normal sinus rhythm  QRS Axis: normal  Intervals: normal  ST/T Wave abnormalities: normal  Conduction Disutrbances:pacs  Narrative Interpretation:   Old EKG Reviewed: changes noted  I have personally reviewed the EKG tracing and agree with the computerized printout as noted.   Radiology No results found.  Procedures Procedures (including critical care time)  Medications Ordered in ED Medications - No data to display   Initial Impression / Assessment and Plan / ED Course  I have reviewed the triage vital signs and the nursing notes.  Pertinent labs & imaging results that were available during my care of the patient were reviewed by me and considered in my medical decision making (see chart for details).    Of note, review of vital signs reveal a one-time verified oxygen saturation of 74%.  Discussed this with the nurse and this was accidentally verified.  Patient did not appear dyspneic and have been no other reports of hypoxia.  78 year old female with history of paroxysmal atrial fibrillation on Eliquis who presents today with atrial flutter with RVR.  Patient spontaneously converted here in ED.  She has been taking her home medications as prescribed including amiodarone and Eliquis.  Patient does report that she has had some fever over the past several days.  She was seen yesterday and worked up for this.  Those labs as well as urine culture and blood  culture were reviewed and are not showing any results.  Received IV fluids here.  Additionally, patient is anemic with hemoglobin 9.5.  She has had history of pancytopenia with her chemotherapy.  Review of labs revealed that she has been 9.5-10-1/2 I feel that her anemia is stable.  She shows no evidence of leukopenia with a white count of 4400.  She has now been in normal sinus rhythm for several hours.  I suspect that some volume depletion was contributing and she has had resolution of symptoms here with IV fluids.  Discussed results with patient.  Discussed return precautions and need for follow-up and she voices understanding. Patient states she feels much better than she can. Final Clinical Impressions(s) / ED Diagnoses   Final diagnoses:  Paroxysmal atrial fibrillation [facility])    ED Discharge Orders    None       Pattricia Boss, MD 05/13/19 1323    Pattricia Boss, MD 05/13/19 1326

## 2019-05-13 NOTE — ED Notes (Signed)
Pt in NSR , EKG done and given to MD

## 2019-05-13 NOTE — Discharge Instructions (Signed)
Your irregular heartbeat has resolved here without intervention besides IV fluids.  I have reviewed the urine and blood cultures from yesterday and so far they do not show any abnormalities.  Please continue to orally hydrate and take your home medications as prescribed.  Return to the emergency department if you are worse at any time.  Follow-up with your doctor tomorrow.

## 2019-05-13 NOTE — ED Triage Notes (Signed)
Pt reports she felt her heart go into afib this morning, hx of afib but has not been in it for years. No pain at present. Resolved previously with medications. Pt endorses slight shortness of breath.

## 2019-05-14 ENCOUNTER — Inpatient Hospital Stay: Payer: Medicare Other

## 2019-05-14 ENCOUNTER — Ambulatory Visit: Payer: Medicare Other | Admitting: Hematology and Oncology

## 2019-05-14 ENCOUNTER — Other Ambulatory Visit: Payer: Medicare Other

## 2019-05-14 ENCOUNTER — Telehealth: Payer: Self-pay

## 2019-05-14 LAB — URINE CULTURE: Culture: NO GROWTH

## 2019-05-14 NOTE — Telephone Encounter (Signed)
Pt called to report she is feeling much better after receiving IVFs in ED.   Has been afebrile since early this morning - temp was 99.0. Most recent temp is 98.9. Pt plans to keep appts for scan on Thurs. Pt states she is going to the beach on Saturday and would like to know if Dr Alvy Bimler thinks she should get chemo p/t beach trip.

## 2019-05-15 ENCOUNTER — Other Ambulatory Visit: Payer: Self-pay | Admitting: Hematology and Oncology

## 2019-05-15 DIAGNOSIS — I480 Paroxysmal atrial fibrillation: Secondary | ICD-10-CM

## 2019-05-15 DIAGNOSIS — C561 Malignant neoplasm of right ovary: Secondary | ICD-10-CM

## 2019-05-17 ENCOUNTER — Ambulatory Visit (HOSPITAL_BASED_OUTPATIENT_CLINIC_OR_DEPARTMENT_OTHER)
Admission: RE | Admit: 2019-05-17 | Discharge: 2019-05-17 | Disposition: A | Discharge status: Home / Self Care | Payer: Medicare Other | Source: Ambulatory Visit | Attending: Hematology and Oncology | Admitting: Hematology and Oncology

## 2019-05-17 ENCOUNTER — Other Ambulatory Visit: Payer: Self-pay

## 2019-05-17 ENCOUNTER — Ambulatory Visit (HOSPITAL_COMMUNITY)
Admission: RE | Admit: 2019-05-17 | Discharge: 2019-05-17 | Disposition: A | Discharge status: Home / Self Care | Payer: Medicare Other | Source: Ambulatory Visit | Attending: Hematology and Oncology | Admitting: Hematology and Oncology

## 2019-05-17 DIAGNOSIS — C561 Malignant neoplasm of right ovary: Secondary | ICD-10-CM

## 2019-05-17 DIAGNOSIS — I480 Paroxysmal atrial fibrillation: Secondary | ICD-10-CM | POA: Diagnosis not present

## 2019-05-17 MED ORDER — IOHEXOL 300 MG/ML  SOLN
100.0000 mL | Freq: Once | INTRAMUSCULAR | Status: AC | PRN
  Administered 2019-05-17: 100 mL via INTRAVENOUS

## 2019-05-17 MED ORDER — SODIUM CHLORIDE (PF) 0.9 % IJ SOLN
INTRAMUSCULAR | Status: AC
  Filled 2019-05-17: qty 50

## 2019-05-17 NOTE — Progress Notes (Signed)
  Echocardiogram 2D Echocardiogram has been performed.  [name] 05/17/2019, 12:42 PM

## 2019-05-18 ENCOUNTER — Inpatient Hospital Stay: Payer: Medicare Other

## 2019-05-18 ENCOUNTER — Encounter: Payer: Self-pay | Admitting: Hematology and Oncology

## 2019-05-18 ENCOUNTER — Inpatient Hospital Stay: Payer: Medicare Other | Attending: Hematology and Oncology | Admitting: Hematology and Oncology

## 2019-05-18 ENCOUNTER — Other Ambulatory Visit: Payer: Self-pay

## 2019-05-18 ENCOUNTER — Other Ambulatory Visit: Payer: Self-pay | Admitting: Hematology and Oncology

## 2019-05-18 DIAGNOSIS — Z7189 Other specified counseling: Secondary | ICD-10-CM

## 2019-05-18 DIAGNOSIS — C569 Malignant neoplasm of unspecified ovary: Secondary | ICD-10-CM | POA: Insufficient documentation

## 2019-05-18 DIAGNOSIS — C772 Secondary and unspecified malignant neoplasm of intra-abdominal lymph nodes: Secondary | ICD-10-CM | POA: Diagnosis not present

## 2019-05-18 DIAGNOSIS — I480 Paroxysmal atrial fibrillation: Secondary | ICD-10-CM | POA: Insufficient documentation

## 2019-05-18 DIAGNOSIS — D61818 Other pancytopenia: Secondary | ICD-10-CM | POA: Diagnosis not present

## 2019-05-18 DIAGNOSIS — Z79899 Other long term (current) drug therapy: Secondary | ICD-10-CM | POA: Insufficient documentation

## 2019-05-18 LAB — CULTURE, BLOOD (ROUTINE X 2)
Culture: NO GROWTH
Culture: NO GROWTH
Special Requests: ADEQUATE
Special Requests: ADEQUATE

## 2019-05-18 NOTE — Assessment & Plan Note (Signed)
She went to the emergency department recently for symptoms of shortness of breath and was found to have paroxysmal atrial fibrillation which subsequently resolved She will continue medical management The patient is at high risk of cardiovascular complication while on treatment Thankfully, repeat echocardiogram from yesterday showed no signs of cardiomyopathy

## 2019-05-18 NOTE — Assessment & Plan Note (Addendum)
We had numerous discussions about goals of care in the past. 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 We discussed importance of Advanced Directives and Living will. She has advanced directives and living will at home.  Her husband, Levada Dy is the medical healthcare power of attorney We discussed prognosis She will likely have less than 6 months based on refractory nature of her disease She will think about home-based palliative care/hospice referral

## 2019-05-18 NOTE — Assessment & Plan Note (Addendum)
Unfortunately, the patient has progressed on chemotherapy She had been to the emergency room twice with complications related to side effects of treatment As previously discussed, her prognosis is poor I estimated any form of chemotherapy in the future would likely be 20% or less expected response rate I truly believe she should benefit from palliative care only and not further chemotherapy She is undecided I recommend the patient to think about it and I will call her when she returns from her trip on 6/29 for final decision

## 2019-05-18 NOTE — Progress Notes (Signed)
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[facility]) Unfortunately, the patient has progressed on chemotherapy She had been to the emergency room twice with complications related to side effects of treatment As previously discussed, her prognosis is poor I estimated any form of chemotherapy in the future would likely be 20% or less expected response rate I truly believe she should benefit from palliative care only and not further chemotherapy She is undecided I recommend the patient to think about it and I will call her when she returns from her trip on 6/29 for final decision   PAF (paroxysmal atrial fibrillation) ([place]): Symptomatic - Rhythm control with Amiodarone. CHA2DS2Vasc Score 7: On Eliquis She went to the emergency department recently for symptoms of shortness of breath and was found to have paroxysmal atrial fibrillation which subsequently resolved She will continue medical management The patient is at high risk of cardiovascular complication while on treatment Thankfully, repeat echocardiogram from yesterday showed no signs of cardiomyopathy  Pancytopenia, acquired ([place]) She has mild pancytopenia from recent chemotherapy She does not need transfusion support at this point  Goals of care, counseling/discussion We had numerous discussions about goals of care in the past. 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 We discussed importance of Advanced Directives and Living will. She has advanced directives and living will at home.  Her husband, Levada Dy is the medical healthcare power of attorney We discussed prognosis She will likely have less than 6 months based on refractory nature of her disease She will think about home-based palliative care/hospice referral   No orders of the defined types were placed in this  encounter.   INTERVAL HISTORY: Please see below for problem oriented charting. 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 Progressed on Aromasin, switched to Tamoxifen Progressed on tamoxifen and Zejula Progressed on carboplatin and taxol, Liposomal doxorubicin [employer] One study: no actionable mutation   Malignant neoplasm of ovary ([place]) (Resolved)  03/11/2013 Initial Diagnosis   Malignant neoplasm of ovary   Recurrent carcinoma of ovary ([place])  01/05/2013 Tumor Marker   Patient's tumor was tested for the following markers: CA-125 Results of the tumor marker test revealed 178.8   01/17/2013 Imaging   CT scan of abdomen  10.0 cm complex right ovarian neoplasm, with findings worrisome for malignancy.  Surgical consultation is advised.  No findings to suggest metastatic disease in the abdomen/pelvis   02/13/2013 Pathology Results   1. Adnexa - ovary +/- tube, neoplastic - HIGH GRADE OVARIAN POORLY DIFFERENTIATED CARCINOMA WITH ASSOCIATED TUMOR NECROSIS, 11.5 CM, INVOLVING THE OVARIAN CAPSULE AND ADJACENT FALLOPIAN TUBE. - PLEASE SEE ONCOLOGY TEMPLATE FOR DETAILS. 2. Ovary and fallopian tube, left - HIGH GRADE OVARIAN POORLY DIFFERENTIATED CARCINOMA WITH ASSOCIATED TUMOR NECROSIS, 2.3 CM, INVOLVING THE OVARIAN CAPSULE. - FALLOPIAN TUBAL TISSUE, NO EVIDENCE OF MALIGNANCY. - PLEASE SEE ONCOLOGY TEMPLATE FOR  DETAILS. 3. Uterus and cervix - ENDOMETRIUM: BENIGN WEAKLY  PROLIFERATIVE ENDOMETRIUM, NO ATYPIA, HYPERPLASIA OR MALIGNANCY. - CERVIX: BENIGN SQUAMOUS MUCOSA AND ENDOCERVICAL MUCOSA, NO DYSPLASIA OR MALIGNANCY. - UTERINE SEROSA: ADHESIONS, NO EVIDENCE OF ENDOMETRIOSIS, ATYPIA OR MALIGNANCY. 4. Cul-de-sac biopsy - POSITIVE FOR METASTATIC CARCINOMA (2.0 CM). 5. Soft tissue mass, simple excision, peri rectal - POSITIVE FOR METASTATIC CARCINOMA (2.6 CM). 6. Lymph nodes, regional resection, right pelvic - SEVEN LYMPH NODES, NEGATIVE FOR METASTATIC CARCINOMA (0/7). 7. Omentum, resection for tumor - MATURE ADIPOSE TISSUE, NO EVIDENCE OF MALIGNANCY. Microscopic Comment 1. OVARY Specimen(s): Right and left ovaries and fallopian tubes, uterus and cervix Procedure(s): Total hysterectomy, bilateral salpingo-oophorectomy Lymph node sampling performed: Yes Primary tumor site: Right and left ovaries Ovarian surface involvement: Present Maximum tumor size (cm): Right ovary: 11.5 cm; left ovary: 2.3 cm, gross measurement. 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 please see comment. Grade: High grade Peritoneal implants: No Pelvic extension: Cul-de-sac biopsy and perirectal soft tissue biopsy: positive Peritoneal washings: Rare cluster of atypical cells present ([id number]) Lymph nodes: number examined 7 ; number positive 0 TNM code: pT2c, pN0 FIGO Stage (based on pathologic findings, needs clinical correlation): II B (2014 edition) Comments: Sections of bilateral ovaries show sheets and nests of malignant epithelium resembling transitional cell carcinoma with associated tumor necrosis. No evidence of Brenner's tumor or conventional endometrioid carcinoma is identified in the background. The bilateral ovarian capsules are involved. Immunohistochemical stains were performed and the malignant cells are strongly positive for cytokeratin 7, WT-1, p16 and p53, negative for cytokeratin 20. The overall  findings are mostly consistent with high grade serous carcinoma with transitional cell carcinoma- like morphology of ovarian primary. The tumor involves the right fallopian tube. Both cul-de-sac and perirectal soft tissue biopsies are positive for metastatic carcinoma. The omentum biopsy is negative for tumor.    03/20/2013 - 08/14/2013 Chemotherapy   The patient had 6 cycles of carboplatin and taxol   09/25/2013 Imaging   1. Interval resection of previously described right pelvic mass. No evidence of recurrent or metastatic disease. 2. Small hiatal hernia. 3. Moderate amount of stool within the rectum. Question constipation or even mild fecal impaction.   04/03/2015 PET scan   1. Highly hypermetabolic height bowed dense mass in the medial spleen compatible with malignancy. 2. Bilateral pelvic sidewall adenopathy, right greater than left, hypermetabolic, with small peritoneal implants of tumor in the lower pelvis, and with several faint but suspected omental implants of tumor. There is also a hypermetabolic aortocaval lymph node in the retroperitoneum. 3. Generalized increased thyroid activity could be due to thyroiditis. Although a well-defined nodule is not seen in the right thyroid lobe, there is focal increased hypermetabolic activity in the right thyroid lobe. Thyroid ultrasound is recommended for further investigation. 4. I suspect that the high activity between the spinous processes of L3 and L4 is degenerative.   05/01/2015 - 10/21/2015 Chemotherapy   The patient had 8 cycles of carboplatin, taxol and Avastin.     07/02/2015 Imaging   1. Decrease in size of these splenic lesion. Suspect tumor also involving the adjacent posterior wall of the stomach. 2. Improved pelvic lymphadenopathy. 3. No new omental or peritoneal surface disease.    09/10/2015 Imaging   1. Response to therapy, with further decreased size of a medial splenic lesion. The previously questioned extension into the stomach  is no longer identified and may have been artifactual on the prior exam. Of note, an inferior  splenic low-density capsular based lesion is felt to be similar and warrants followup attention. 2. No adenopathy or new peritoneal disease identified. 3.  Possible constipation. 4.  Advanced aortic and branch vessel atherosclerosis.   11/06/2015 Imaging   1. Mass arising from the medial spleen and involving the posterior wall of stomach demonstrates mild decrease in size in the interval. 2. Interval improvement and previously referenced pathologic iliac lymph nodes. 3. Previously referenced peritoneal implants within the pelvis are no longer measurable. No new areas of peritoneal metastasis is identified. 4. There is a right hilar node which is borderline enlarged but is increased in size when compared with 04/19/2013. Attention on follow-up examination is recommended.   11/18/2015 PET scan   1. Residual hypermetabolic tumor implant between the stomach and spleen, significantly decreased in size and mildly decreased in metabolism compared to 04/03/2015 PET-CT, and slightly decreased in size since 11/06/2015. 2. Residual hypermetabolic right deep pelvic side wall lymph node, with partial treatment response compared to 04/03/2015 PET-CT. 3. No new sites of hypermetabolic metastatic disease. 4. Stable diffuse thyroid hypermetabolism without discrete thyroid nodule, most suggestive of diffuse thyroiditis. Recommend correlation with serum thyroid function tests.    01/28/2016 - 11/26/2016 Anti-estrogen oral therapy   She was placed on Letrozole, stopped due to poor tolerance   02/09/2016 Imaging   1. Continued decrease in size of peritoneal lesion between the posterior wall of stomach and spleen. 2. No new or progressive disease identified   06/23/2016 Imaging   1. Near complete resolution of subtle residual soft tissue density lesion in the gastrosplenic ligament. 2. No new sites of disease  identified. 3. Stool burden suggests constipation or even fecal impaction   12/01/2016 Imaging   1. No CT findings for residual or recurrent abdominal/pelvic ovarian cancer. 2. No acute abdominal/pelvic findings. 3. Large amount of stool throughout the colon and down into the rectum suggesting constipation. 4. Stable atherosclerotic calcifications involving the aorta and iliac arteries.   03/29/2017 - 10/29/2017 Anti-estrogen oral therapy   She was on Aromasin   04/19/2017 Imaging   1. Suspicion of peritoneal recurrence with enlarging implants in the gastrosplenic ligament, left adnexa and posterior to the cecum. No generalized ascites, adenopathy or definite solid organ disease. 2. No evidence of bowel or ureteral obstruction. 3.  Aortic Atherosclerosis (ICD10-I70.0).   06/25/2017 Genetic Testing   Patient has genetic testing done for Breast/ovarian cancer panel Results revealed patient has no detectable mutation   09/01/2017 Tumor Marker   Patient's tumor was tested for the following markers: CA-125 Results of the tumor marker test revealed 40.9   10/29/2017 - 03/24/2018 Anti-estrogen oral therapy   She was on tamoxifen   11/05/2017 Tumor Marker   Patient's tumor was tested for the following markers: CA-125 Results of the tumor marker test revealed 84.4   11/15/2017 Imaging   1. Mild progression of peritoneal metastasis. 2. Possible constipation. No obstruction or other acute complication. 3.  Aortic Atherosclerosis (ICD10-I70.0).   03/21/2018 Tumor Marker   Patient's tumor was tested for the following markers: CA-125 Results of the tumor marker test revealed 101.3.   04/03/2018 Tumor Marker   Patient's tumor was tested for the following markers: CA-125 Results of the tumor marker test revealed 120.9   04/10/2018 - 07/17/2018 Chemotherapy   The patient had single agent carboplatin x 5 cycles   05/02/2018 Tumor Marker   Patient's tumor was tested for the following markers:  CA-125 Results of the tumor marker test revealed 68.2  05/29/2018 Tumor Marker   Patient's tumor was tested for the following markers: CA-125 Results of the tumor marker test revealed 41.7    Genetic Testing   Patient has genetic testing done for HRD. Results revealed: negative for deleterious BRCA1 or BRCA2 mutations in tumor.    06/26/2018 Tumor Marker   Patient's tumor was tested for the following markers: CA-125 Results of the tumor marker test revealed 29.5   07/14/2018 Imaging   Interval decrease in size of bilateral adnexal masses.  No significant change in size of peritoneal metastasis in the left upper quadrant.  No new or progressive metastatic disease identified.   08/07/2018 - 11/07/2018 Chemotherapy   The patient had zejula   08/14/2018 Tumor Marker   Patient's tumor was tested for the following markers: CA-125 Results of the tumor marker test revealed 31.7   10/16/2018 Tumor Marker   Patient's tumor was tested for the following markers: CA-125 Results of the tumor marker test revealed 49   11/06/2018 Imaging   1. Mild to moderate enlargement of the left upper quadrant lesion along the gastric wall and pancreatic tail suspicious for malignancy. Subtle invasion of the capsule of the spleen as before. 2. Stable soft tissue nodularity along the right adnexa. Slightly reduced soft tissue prominence along the left adnexa. By report the patient has had bilateral salpingo oophorectomy and accordingly these adnexal lesions are concerning for tumor. 3. No new areas of tumor nodularity are identified. 4. Other imaging findings of potential clinical significance: Pelvic floor laxity with small cystocele. Prominent stool throughout the colon favors constipation. Upper normal sized left periaortic lymph node. Bibasilar cylindrical bronchiectasis. Aortic Atherosclerosis (ICD10-I70.0). Coronary atherosclerosis.   11/06/2018 Tumor Marker   Patient's tumor was tested for the following  markers: CA-125 Results of the tumor marker test revealed 61.5   11/25/2018 - 01/28/2019 Chemotherapy   The patient had carboplatin and taxol   12/18/2018 Tumor Marker   Patient's tumor was tested for the following markers: CA-125 Results of the tumor marker test revealed 55.3   01/26/2019 Imaging   3.2 x 4.2 cm low-density lesion along the posterior aspect of the gastric cardia, abutting the splenic hilum and pancreatic tail, suspicious for metastasis. This is stable versus mildly decreased.  2.9 x 2.8 cm mass along the posterior aspect of the cecum (previously described as right adnexal on prior CT), favoring cecal/appendiceal neoplasm, grossly unchanged.  2.6 cm left pelvic/adnexal implant, suspicious for metastasis, mildly increased.  11 mm short axis left para-aortic nodal metastasis, mildly increased.  Low-density splenic lesions along the posterior spleen, new, indeterminate.   01/26/2019 Tumor Marker   Patient's tumor was tested for the following markers: CA-125 Results of the tumor marker test revealed 41.4   02/16/2019 Echocardiogram    1. The left ventricle has hyperdynamic systolic function, with an ejection fraction of >65%. The cavity size was normal. There is mild concentric left ventricular hypertrophy. Left ventricular diastolic Doppler parameters are consistent with impaired relaxation.  2. The right ventricle has normal systolic function. The cavity was normal. There is no increase in right ventricular wall thickness.  3. Mild thickening of the mitral valve leaflet.   02/19/2019 - 05/17/2019 Chemotherapy   The patient had DOXOrubicin HCL LIPOSOMAL (DOXIL)    03/19/2019 Tumor Marker   Patient's tumor was tested for the following markers: CA-125 Results of the tumor marker test revealed 90   04/16/2019 Tumor Marker   Patient's tumor was tested for the following markers: CA-125 Results of the  tumor marker test revealed 60.9   05/17/2019 Imaging   CT abdomen and  pelvis 1. Concern for mild progression of peritoneal metastasis. 2. Mass lesion in the gastrosplenic ligament increased slightly in size. 3. Interval increase in low-density lesion in spleen concern for metastasis. 4. Stable implant in the serosal surface of cecum. 5. Increase in size of LEFT periaortic lymph node. 6. Interval mild increase in size of cystic lesion the deep LEFT pelvis/adnexal region adjacent to the rectum.   05/17/2019 Echocardiogram    1. The left ventricle has hyperdynamic systolic function, with an ejection fraction of >65%. The cavity size was normal. There is moderately increased left ventricular wall thickness. Left ventricular diastolic Doppler parameters are consistent with impaired relaxation. Indeterminate filling pressures The E/e' is 8-15. No evidence of left ventricular regional wall motion abnormalities.  2. The right ventricle has normal systolic function. The cavity was normal. There is no increase in right ventricular wall thickness.  3. Left atrial size was mildly dilated.  4. The mitral valve is abnormal. Mitral valve regurgitation is moderate by color flow Doppler. The MR jet is posteriorly-directed.  5. The aortic valve is tricuspid. Mild sclerosis of the aortic valve. No stenosis of the aortic valve.  6. Small patent foramen ovale with predominantly left to right shunting across the atrial septum.  7. Evidence of atrial level shunting detected by color flow Doppler.  8. The average left ventricular global longitudinal strain is -29.0 %.  9. When compared to the prior study: 02/16/2019: LVEF >65%, GLS -17.2%.     REVIEW OF SYSTEMS:   Constitutional: Denies fevers, chills or abnormal weight loss Eyes: Denies blurriness of vision Ears, nose, mouth, throat, and face: Denies mucositis or sore throat Cardiovascular: Denies palpitation, chest discomfort or lower extremity swelling Skin: Denies abnormal skin rashes Lymphatics: Denies new lymphadenopathy or  easy bruising Neurological:Denies numbness, tingling or new weaknesses Behavioral/Psych: Mood is stable, no new changes  All other systems were reviewed with the patient and are negative.  I have reviewed the past medical history, past surgical history, social history and family history with the patient and they are unchanged from previous note.  ALLERGIES:  is allergic to clonidine derivatives; coreg [carvedilol]; augmentin [amoxicillin-pot clavulanate]; caffeine; codeine; flexeril [cyclobenzaprine]; lipitor [atorvastatin]; lisinopril; pravastatin; tape; tegaderm ag mesh [silver]; and ultram [tramadol].  MEDICATIONS:  Current Outpatient Medications  Medication Sig Dispense Refill  . acetaminophen (TYLENOL) 500 MG tablet Take 1,000 mg by mouth every 6 (six) hours as needed for mild pain.     Marland Kitchen alendronate (FOSAMAX) 70 MG tablet Take 70 mg by mouth See admin instructions. Take with a full glass of water on an empty stomach. Twice Monthly    . amiodarone (PACERONE) 200 MG tablet TAKE 1/2 TABLET BY MOUTH DAILY (Patient taking differently: Take 100 mg by mouth daily. ) 45 tablet 3  . Biotin 10 MG TABS Take 20 mg by mouth daily.    Marland Kitchen co-enzyme Q-10 30 MG capsule Take 100 mg by mouth daily.     Marland Kitchen desonide (DESOWEN) 0.05 % cream Apply 1 application topically 2 (two) times daily.   1  . diphenhydrAMINE (BENADRYL) 25 MG tablet Take 50 mg by mouth at bedtime as needed for sleep.     Marland Kitchen docusate sodium (COLACE) 100 MG capsule Take 250 mg by mouth 2 (two) times daily.     Marland Kitchen ELIQUIS 5 MG TABS tablet TAKE 1 TABLET BY MOUTH TWICE A DAY (Patient taking differently: Take 5  mg by mouth 2 (two) times daily. ) 180 tablet 1  . Evolocumab with Infusor 420 MG/3.5ML SOCT Inject 420 mg into the skin every 30 (thirty) days.     Marland Kitchen ezetimibe (ZETIA) 10 MG tablet Take 1 tablet (10 mg total) by mouth daily. (Patient taking differently: Take 10 mg by mouth at bedtime. ) 30 tablet 6  . Glucosamine HCl 1000 MG TABS Take 1,000  mg by mouth daily.    . hydrocortisone 1 % ointment Apply 1 application topically 2 (two) times daily as needed for itching (eczema).     Marland Kitchen ipratropium (ATROVENT) 0.06 % nasal spray Place 2 sprays into both nostrils daily.   5  . lidocaine-prilocaine (EMLA) cream Apply to Porta-Cath site 1-2 hours prior to access as directed. 30 g 1  . magnesium hydroxide (MILK OF MAGNESIA) 800 MG/5ML suspension Take 30 mLs by mouth daily as needed for constipation. Reported on 11/20/2015    . Melatonin 5 MG CAPS Take 5 mg by mouth at bedtime as needed (sleep). Reported on 12/25/2015    . Menthol, Topical Analgesic, (ICY HOT EX) Apply 1 application topically 2 (two) times daily as needed (PAIN).     . Multiple Vitamin (MULTIVITAMIN WITH MINERALS) TABS tablet Take 1 tablet by mouth daily. Centrum Silver    . ondansetron (ZOFRAN) 8 MG tablet TAKE 1 TABLET (8 MG TOTAL) BY MOUTH EVERY 8 (EIGHT) HOURS AS NEEDED FOR NAUSEA OR VOMITING. (Patient taking differently: Take 8 mg by mouth daily as needed for nausea or vomiting. ) 90 tablet 1  . Polyethyl Glycol-Propyl Glycol (SYSTANE OP) Place 1 drop into both eyes 2 (two) times daily.     . polyethylene glycol (MIRALAX / GLYCOLAX) packet Take 17 g by mouth 2 (two) times a day. Mix in 8 oz liquid and drink    . potassium chloride (K-DUR) 10 MEQ tablet Take 10 mEq by mouth daily.    . prochlorperazine (COMPAZINE) 10 MG tablet TAKE 1 TABLET (10 MG TOTAL) BY MOUTH EVERY 6 (SIX) HOURS AS NEEDED (NAUSEA OR VOMITING). (Patient taking differently: Take 10 mg by mouth daily as needed (Nausea or vomiting). ) 30 tablet 1  . SYNTHROID 88 MCG tablet Take 88 mcg by mouth daily.     No current facility-administered medications for this visit.    Facility-Administered Medications Ordered in Other Visits  Medication Dose Route Frequency Provider Last Rate Last Dose  . sodium chloride flush (NS) 0.9 % injection 10 mL  10 mL Intravenous PRN Joylene John D, NP   10 mL at 01/06/18 1220     PHYSICAL EXAMINATION: ECOG PERFORMANCE STATUS: 1 - Symptomatic but completely ambulatory  Vitals:   05/18/19 1042  BP: (!) 146/69  Pulse: 74  Resp: 18  Temp: 98.7 F (37.1 C)  SpO2: 100%   Filed Weights   05/18/19 1042  Weight: 130 lb (59 kg)    GENERAL:alert, no distress and comfortable Musculoskeletal:no cyanosis of digits and no clubbing  NEURO: alert & oriented x 3 with fluent speech, no focal motor/sensory deficits  LABORATORY DATA:  I have reviewed the data as listed    Component Value Date/Time   NA 134 (L) 05/13/2019 1229   NA 141 11/11/2017 1145   K 3.8 05/13/2019 1229   K 4.0 11/11/2017 1145   CL 100 05/13/2019 1229   CL 103 05/21/2013 1105   CO2 26 05/13/2019 1229   CO2 29 11/11/2017 1145   GLUCOSE 97 05/13/2019 1229   GLUCOSE  81 11/11/2017 1145   GLUCOSE 74 05/21/2013 1105   BUN 8 05/13/2019 1229   BUN 10.9 11/11/2017 1145   CREATININE 0.76 05/13/2019 1229   CREATININE 0.74 04/16/2019 0948   CREATININE 0.7 11/11/2017 1145   CALCIUM 8.3 (L) 05/13/2019 1229   CALCIUM 9.6 11/11/2017 1145   PROT 5.4 (L) 05/13/2019 1229   PROT 6.5 11/24/2016 1104   ALBUMIN 2.9 (L) 05/13/2019 1229   ALBUMIN 3.9 11/24/2016 1104   AST 25 05/13/2019 1229   AST 18 04/16/2019 0948   AST 18 11/24/2016 1104   ALT 22 05/13/2019 1229   ALT 14 04/16/2019 0948   ALT 15 11/24/2016 1104   ALKPHOS 81 05/13/2019 1229   ALKPHOS 104 11/24/2016 1104   BILITOT 0.7 05/13/2019 1229   BILITOT 0.4 04/16/2019 0948   BILITOT 0.57 11/24/2016 1104   GFRNONAA >60 05/13/2019 1229   GFRNONAA >60 04/16/2019 0948   GFRAA >60 05/13/2019 1229   GFRAA >60 04/16/2019 0948    No results found for: SPEP, UPEP  Lab Results  Component Value Date   WBC 4.4 05/13/2019   NEUTROABS 2.8 05/12/2019   HGB 9.5 (L) 05/13/2019   HCT 28.6 (L) 05/13/2019   MCV 103.2 (H) 05/13/2019   PLT 112 (L) 05/13/2019      Chemistry      Component Value Date/Time   NA 134 (L) 05/13/2019 1229   NA 141  11/11/2017 1145   K 3.8 05/13/2019 1229   K 4.0 11/11/2017 1145   CL 100 05/13/2019 1229   CL 103 05/21/2013 1105   CO2 26 05/13/2019 1229   CO2 29 11/11/2017 1145   BUN 8 05/13/2019 1229   BUN 10.9 11/11/2017 1145   CREATININE 0.76 05/13/2019 1229   CREATININE 0.74 04/16/2019 0948   CREATININE 0.7 11/11/2017 1145      Component Value Date/Time   CALCIUM 8.3 (L) 05/13/2019 1229   CALCIUM 9.6 11/11/2017 1145   ALKPHOS 81 05/13/2019 1229   ALKPHOS 104 11/24/2016 1104   AST 25 05/13/2019 1229   AST 18 04/16/2019 0948   AST 18 11/24/2016 1104   ALT 22 05/13/2019 1229   ALT 14 04/16/2019 0948   ALT 15 11/24/2016 1104   BILITOT 0.7 05/13/2019 1229   BILITOT 0.4 04/16/2019 0948   BILITOT 0.57 11/24/2016 1104       RADIOGRAPHIC STUDIES: I have personally reviewed the radiological images as listed and agreed with the findings in the report. Ct Abdomen Pelvis W Contrast  Result Date: 05/17/2019 CLINICAL DATA:  Epithelial ovarian neoplasm. Peritoneal carcinomatosis EXAM: CT ABDOMEN AND PELVIS WITH CONTRAST TECHNIQUE: Multidetector CT imaging of the abdomen and pelvis was performed using the standard protocol following bolus administration of intravenous contrast. CONTRAST:  140m OMNIPAQUE IOHEXOL 300 MG/ML  SOLN COMPARISON:  CT 01/26/2011 FINDINGS: Lower chest: Small 2 mm nodule in the LEFT lower lobe (image 1/series 4). Hepatobiliary: No focal hepatic lesion. No biliary duct dilatation. Gallbladder is normal. Common bile duct is normal. Pancreas: Pancreas is normal. No ductal dilatation. No pancreatic inflammation. Spleen: Arc like regions of low attenuation in the central spleen measuring up to 4 cm are new from comparison exam. Adrenals/urinary tract: Adrenal glands and kidneys are normal. The ureters and bladder normal. Stomach/Bowel: Lesion along the gastric fundus measures 4.8 x 3.3 cm compared to 3.9 x 3.0 cm for potential increase in size. Stomach, small-bowel and cecum normal.  Adjacent to the cecum soft tissue mass measuring 3.1 cm (image 65/2) compares to  3.0 cm. Moderate volume stool in the ascending transverse colon. On a stool in descending colon. No acute inflammation. Adjacent to the LEFT aspect of the rectum bilobed nodule central low-attenuation is similar to mildly increased in size largest component measures 3 cm compared with 2.5 cm on prior Vascular/Lymphatic: . Abdominal aorta is normal caliber with atherosclerotic calcification. Lymph node adjacent to the aorta measures 17 mm increased from 11 mm. Reproductive: Post hysterectomy Other: Peritoneal metastasis scribe GI section Musculoskeletal: No aggressive osseous lesion. IMPRESSION: 1. Concern for mild progression of peritoneal metastasis. 2. Mass lesion in the gastrosplenic ligament increased slightly in size. 3. Interval increase in low-density lesion in spleen concern for metastasis. 4. Stable implant in the serosal surface of cecum. 5. Increase in size of LEFT periaortic lymph node. 6. Interval mild increase in size of cystic lesion the deep LEFT pelvis/adnexal region adjacent to the rectum. Electronically Signed   By: Suzy Bouchard M.D.   On: 05/17/2019 14:33   Dg Chest Port 1 View  Result Date: 05/13/2019 CLINICAL DATA:  Fever. Atrial fibrillation. Previous myocardial infarct. EXAM: PORTABLE CHEST 1 VIEW COMPARISON:  06/04/2016 FINDINGS: The heart size and mediastinal contours are within normal limits. Right-sided Port-A-Cath remains in appropriate position. Aortic atherosclerosis. Both lungs are clear. The visualized skeletal structures are unremarkable. IMPRESSION: No active disease. Electronically Signed   By: Earle Gell M.D.   On: 05/13/2019 11:38    All questions were answered. The patient knows to call the clinic with any problems, questions or concerns. No barriers to learning was detected.  I spent 30 minutes counseling the patient face to face. The total time spent in the appointment was 40  minutes and more than 50% was on counseling and review of test results  Heath Lark, MD 05/18/2019 11:08 AM

## 2019-05-18 NOTE — Assessment & Plan Note (Signed)
She has mild pancytopenia from recent chemotherapy She does not need transfusion support at this point

## 2019-05-24 ENCOUNTER — Other Ambulatory Visit: Payer: Medicare Other

## 2019-05-24 ENCOUNTER — Ambulatory Visit: Payer: Medicare Other

## 2019-05-24 ENCOUNTER — Inpatient Hospital Stay: Payer: Medicare Other

## 2019-05-24 ENCOUNTER — Ambulatory Visit: Payer: Medicare Other | Admitting: Hematology and Oncology

## 2019-05-28 ENCOUNTER — Telehealth: Payer: Self-pay | Admitting: Oncology

## 2019-05-28 NOTE — Telephone Encounter (Signed)
Called [name] back and advised her of message from Dr. Alvy Bimler.  She verbalized agreement and will discuss everything with her PCP to make a final decision.

## 2019-05-28 NOTE — Telephone Encounter (Signed)
Chances of her responding to gemcitabine is less than 20% and if she responds, she may respond briefly for 3 months at most

## 2019-05-28 NOTE — Telephone Encounter (Signed)
Called [name] to see if she has decided to have palliative care.  She said she does have a question about gemcitabine - how effective it would be.  She also would like to know the results of the [employer] [facility] testing done by Dr. Fermin Schwab on 02/19/19.  She said that she has basically decided on palliative care but just wanted Dr. Calton Dach input on the gemcitabine.  She is making an appointment with her PCP, Dr. Virgina Jock to have palliative care arranged.

## 2019-05-29 ENCOUNTER — Ambulatory Visit: Payer: Medicare Other

## 2019-05-30 ENCOUNTER — Telehealth: Payer: Self-pay

## 2019-05-30 NOTE — Telephone Encounter (Signed)
She called and left a message. She spoke to [name] earlier this week. She saw her PCP and has decided to not do any more chemotherapy. She would like to thank everyone for taking care of her.

## 2019-08-02 ENCOUNTER — Other Ambulatory Visit: Payer: Self-pay | Admitting: Gynecologic Oncology

## 2019-08-02 DIAGNOSIS — C569 Malignant neoplasm of unspecified ovary: Secondary | ICD-10-CM

## 2019-08-30 ENCOUNTER — Telehealth: Payer: Self-pay | Admitting: Cardiology

## 2019-08-30 NOTE — Telephone Encounter (Signed)
Spoke with patient to schedule PFT's ordered for October, 2020 by Dr. Ellyn Hack.  Patient states she s under Hospice care and does not wish to schedule any additional testing.

## 2019-09-13 ENCOUNTER — Other Ambulatory Visit (HOSPITAL_COMMUNITY)
Admission: RE | Admit: 2019-09-13 | Discharge: 2019-09-13 | Disposition: A | Discharge status: Home / Self Care | Payer: Medicare Other | Source: Ambulatory Visit | Attending: Internal Medicine | Admitting: Internal Medicine

## 2019-09-13 DIAGNOSIS — R509 Fever, unspecified: Secondary | ICD-10-CM | POA: Insufficient documentation

## 2019-09-13 LAB — URINALYSIS, ROUTINE W REFLEX MICROSCOPIC
Bilirubin Urine: NEGATIVE
Glucose, UA: NEGATIVE mg/dL
Hgb urine dipstick: NEGATIVE
Ketones, ur: NEGATIVE mg/dL
Nitrite: NEGATIVE
Protein, ur: 30 mg/dL — AB
Specific Gravity, Urine: 1.018 (ref 1.005–1.030)
pH: 5 (ref 5.0–8.0)

## 2019-09-13 LAB — CBC WITH DIFFERENTIAL/PLATELET
Abs Immature Granulocytes: 0.3 10*3/uL — ABNORMAL HIGH (ref 0.00–0.07)
Basophils Absolute: 0 10*3/uL (ref 0.0–0.1)
Basophils Relative: 0 %
Eosinophils Absolute: 0 10*3/uL (ref 0.0–0.5)
Eosinophils Relative: 0 %
HCT: 29.6 % — ABNORMAL LOW (ref 36.0–46.0)
Hemoglobin: 9.7 g/dL — ABNORMAL LOW (ref 12.0–15.0)
Immature Granulocytes: 3 %
Lymphocytes Relative: 6 %
Lymphs Abs: 0.6 10*3/uL — ABNORMAL LOW (ref 0.7–4.0)
MCH: 35 pg — ABNORMAL HIGH (ref 26.0–34.0)
MCHC: 32.8 g/dL (ref 30.0–36.0)
MCV: 106.9 fL — ABNORMAL HIGH (ref 80.0–100.0)
Monocytes Absolute: 0.6 10*3/uL (ref 0.1–1.0)
Monocytes Relative: 7 %
Neutro Abs: 8.1 10*3/uL — ABNORMAL HIGH (ref 1.7–7.7)
Neutrophils Relative %: 84 %
Platelets: 127 10*3/uL — ABNORMAL LOW (ref 150–400)
RBC: 2.77 MIL/uL — ABNORMAL LOW (ref 3.87–5.11)
RDW: 13.6 % (ref 11.5–15.5)
WBC: 9.7 10*3/uL (ref 4.0–10.5)
nRBC: 0 % (ref 0.0–0.2)

## 2019-09-13 LAB — BASIC METABOLIC PANEL
Anion gap: 9 (ref 5–15)
BUN: 23 mg/dL (ref 8–23)
CO2: 26 mmol/L (ref 22–32)
Calcium: 8.5 mg/dL — ABNORMAL LOW (ref 8.9–10.3)
Chloride: 95 mmol/L — ABNORMAL LOW (ref 98–111)
Creatinine, Ser: 1 mg/dL (ref 0.44–1.00)
GFR calc Af Amer: >60 mL/min (ref >60)
GFR calc non Af Amer: 55 mL/min — ABNORMAL LOW (ref >60)
Glucose, Bld: 104 mg/dL — ABNORMAL HIGH (ref 70–99)
Potassium: 3.6 mmol/L (ref 3.5–5.1)
Sodium: 130 mmol/L — ABNORMAL LOW (ref 135–145)

## 2019-09-14 ENCOUNTER — Telehealth: Payer: Self-pay | Admitting: Cardiology

## 2019-09-14 LAB — URINE CULTURE

## 2019-09-14 MED ORDER — AMIODARONE HCL 200 MG PO TABS: 200.0000 mg | ORAL_TABLET | Freq: Every day | ORAL | 3 refills | Status: DC

## 2019-09-14 NOTE — Telephone Encounter (Signed)
Recommendation would be for her to bump up to 400 twice daily for 4 days then 200 twice daily for 4 days then stay at 200 a day until it goes away.  If necessary we can cardiovert, but I think we may just need to leave it at 200 mg daily for about a month or so and then go back to 100 if if no longer in A. Fib.  Was not aware that she is now on hospice care.  I knew she had recurrence of cancer, but did not know it was that bad.  Not great news to hear before the weekend  Glenetta Hew, MD

## 2019-09-14 NOTE — Telephone Encounter (Signed)
Informed patient of instruction for increase dose of amiodarone.

## 2019-09-14 NOTE — Telephone Encounter (Signed)
Spoke with patient of Dr. Ellyn Hack last seen 10/2018. She is currently on hospitce care for ovarian cancer. She as PAF and is having an AF episode now, for the past 1 hour. She reports palpitations/irregular heart beat. She reports in the past, tramadol has triggered this, and she took 4 tramadol yesterday.   BP 108/83 HR 111  She states MD had told her she could take extra amiodarone if she had breakthrough AF. Advised she take extra 100mg  now and will route message to MD for clarification on PRN breakthrough amiodarone dosing  Located in 2018 MD note: PAF (paroxysmal atrial fibrillation) ([place]): Symptomatic - Rhythm control with Amiodarone. CHA2DS2Vasc Score 7: On Eliquis - Primary (Chronic)     For the most part, she is maintaining sinus rhythm with amiodarone.  Currently taking 100 mg daily.  No longer on a beta-blocker because of chronotropic incompetence. Does not really seem to be having any breakthroughs, Recommendation: If she does have breakthrough episode, recommend take amiodarone 400 mg twice daily until it breaks

## 2019-09-14 NOTE — Telephone Encounter (Signed)
Patient c/o Palpitations:  High priority if patient c/o lightheadedness, shortness of breath, or chest pain  1) How long have you had palpitations/irregular HR/ Afib? Are you having the symptoms now? Yes   2) Are you currently experiencing lightheadedness, SOB or CP? no  3) Do you have a history of afib (atrial fibrillation) or irregular heart rhythm? History of A-Fib  4) Have you checked your BP or HR? (document readings if available):  111  BP 108/38  5) Are you experiencing any other symptoms? No. Patient would like to know if she should take another amiodarone (PACERONE) 200 MG tablet. Patient states she has taken tramadol in the past and it has caused her to have A-Fib, but she hasn't taken one in 24 hrs.

## 2019-09-21 ENCOUNTER — Inpatient Hospital Stay (HOSPITAL_COMMUNITY)
Admission: EM | Admit: 2019-09-21 | Discharge: 2019-09-23 | DRG: 378 | Disposition: A | Discharge status: Hospice | Attending: Internal Medicine | Admitting: Internal Medicine

## 2019-09-21 ENCOUNTER — Other Ambulatory Visit: Payer: Self-pay

## 2019-09-21 ENCOUNTER — Encounter (HOSPITAL_COMMUNITY): Payer: Self-pay | Admitting: Family Medicine

## 2019-09-21 DIAGNOSIS — D649 Anemia, unspecified: Secondary | ICD-10-CM | POA: Diagnosis not present

## 2019-09-21 DIAGNOSIS — R42 Dizziness and giddiness: Secondary | ICD-10-CM | POA: Diagnosis present

## 2019-09-21 DIAGNOSIS — Z79890 Hormone replacement therapy (postmenopausal): Secondary | ICD-10-CM

## 2019-09-21 DIAGNOSIS — Z96641 Presence of right artificial hip joint: Secondary | ICD-10-CM | POA: Diagnosis present

## 2019-09-21 DIAGNOSIS — K649 Unspecified hemorrhoids: Secondary | ICD-10-CM | POA: Diagnosis present

## 2019-09-21 DIAGNOSIS — E039 Hypothyroidism, unspecified: Secondary | ICD-10-CM | POA: Diagnosis present

## 2019-09-21 DIAGNOSIS — C569 Malignant neoplasm of unspecified ovary: Secondary | ICD-10-CM | POA: Diagnosis not present

## 2019-09-21 DIAGNOSIS — Z885 Allergy status to narcotic agent status: Secondary | ICD-10-CM

## 2019-09-21 DIAGNOSIS — Z91048 Other nonmedicinal substance allergy status: Secondary | ICD-10-CM

## 2019-09-21 DIAGNOSIS — K922 Gastrointestinal hemorrhage, unspecified: Secondary | ICD-10-CM | POA: Diagnosis not present

## 2019-09-21 DIAGNOSIS — Z8249 Family history of ischemic heart disease and other diseases of the circulatory system: Secondary | ICD-10-CM

## 2019-09-21 DIAGNOSIS — D509 Iron deficiency anemia, unspecified: Secondary | ICD-10-CM | POA: Diagnosis present

## 2019-09-21 DIAGNOSIS — Z66 Do not resuscitate: Secondary | ICD-10-CM | POA: Diagnosis present

## 2019-09-21 DIAGNOSIS — M1611 Unilateral primary osteoarthritis, right hip: Secondary | ICD-10-CM | POA: Diagnosis present

## 2019-09-21 DIAGNOSIS — Z8673 Personal history of transient ischemic attack (TIA), and cerebral infarction without residual deficits: Secondary | ICD-10-CM

## 2019-09-21 DIAGNOSIS — Z888 Allergy status to other drugs, medicaments and biological substances status: Secondary | ICD-10-CM

## 2019-09-21 DIAGNOSIS — I10 Essential (primary) hypertension: Secondary | ICD-10-CM | POA: Diagnosis present

## 2019-09-21 DIAGNOSIS — Z96652 Presence of left artificial knee joint: Secondary | ICD-10-CM | POA: Diagnosis present

## 2019-09-21 DIAGNOSIS — Z20828 Contact with and (suspected) exposure to other viral communicable diseases: Secondary | ICD-10-CM | POA: Diagnosis present

## 2019-09-21 DIAGNOSIS — E785 Hyperlipidemia, unspecified: Secondary | ICD-10-CM | POA: Diagnosis present

## 2019-09-21 DIAGNOSIS — K921 Melena: Principal | ICD-10-CM | POA: Diagnosis present

## 2019-09-21 DIAGNOSIS — I252 Old myocardial infarction: Secondary | ICD-10-CM

## 2019-09-21 DIAGNOSIS — Z85828 Personal history of other malignant neoplasm of skin: Secondary | ICD-10-CM

## 2019-09-21 DIAGNOSIS — I480 Paroxysmal atrial fibrillation: Secondary | ICD-10-CM

## 2019-09-21 DIAGNOSIS — Z9221 Personal history of antineoplastic chemotherapy: Secondary | ICD-10-CM

## 2019-09-21 DIAGNOSIS — Z7901 Long term (current) use of anticoagulants: Secondary | ICD-10-CM

## 2019-09-21 DIAGNOSIS — M1712 Unilateral primary osteoarthritis, left knee: Secondary | ICD-10-CM | POA: Diagnosis present

## 2019-09-21 DIAGNOSIS — Z515 Encounter for palliative care: Secondary | ICD-10-CM | POA: Diagnosis present

## 2019-09-21 DIAGNOSIS — Z79899 Other long term (current) drug therapy: Secondary | ICD-10-CM

## 2019-09-21 DIAGNOSIS — Z7983 Long term (current) use of bisphosphonates: Secondary | ICD-10-CM

## 2019-09-21 DIAGNOSIS — Z808 Family history of malignant neoplasm of other organs or systems: Secondary | ICD-10-CM

## 2019-09-21 LAB — COMPREHENSIVE METABOLIC PANEL
ALT: 15 U/L (ref 0–44)
AST: 18 U/L (ref 15–41)
Albumin: 3 g/dL — ABNORMAL LOW (ref 3.5–5.0)
Alkaline Phosphatase: 56 U/L (ref 38–126)
Anion gap: 8 (ref 5–15)
BUN: 20 mg/dL (ref 8–23)
CO2: 27 mmol/L (ref 22–32)
Calcium: 8.7 mg/dL — ABNORMAL LOW (ref 8.9–10.3)
Chloride: 101 mmol/L (ref 98–111)
Creatinine, Ser: 0.74 mg/dL (ref 0.44–1.00)
GFR calc Af Amer: >60 mL/min (ref >60)
GFR calc non Af Amer: >60 mL/min (ref >60)
Glucose, Bld: 115 mg/dL — ABNORMAL HIGH (ref 70–99)
Potassium: 4.3 mmol/L (ref 3.5–5.1)
Sodium: 136 mmol/L (ref 135–145)
Total Bilirubin: 0.5 mg/dL (ref 0.3–1.2)
Total Protein: 5.7 g/dL — ABNORMAL LOW (ref 6.5–8.1)

## 2019-09-21 LAB — CBC WITH DIFFERENTIAL/PLATELET
Abs Immature Granulocytes: 0.05 10*3/uL (ref 0.00–0.07)
Basophils Absolute: 0 10*3/uL (ref 0.0–0.1)
Basophils Relative: 0 %
Eosinophils Absolute: 0 10*3/uL (ref 0.0–0.5)
Eosinophils Relative: 0 %
HCT: 23.6 % — ABNORMAL LOW (ref 36.0–46.0)
Hemoglobin: 7.5 g/dL — ABNORMAL LOW (ref 12.0–15.0)
Immature Granulocytes: 1 %
Lymphocytes Relative: 14 %
Lymphs Abs: 1.1 10*3/uL (ref 0.7–4.0)
MCH: 34.1 pg — ABNORMAL HIGH (ref 26.0–34.0)
MCHC: 31.8 g/dL (ref 30.0–36.0)
MCV: 107.3 fL — ABNORMAL HIGH (ref 80.0–100.0)
Monocytes Absolute: 0.4 10*3/uL (ref 0.1–1.0)
Monocytes Relative: 5 %
Neutro Abs: 6.6 10*3/uL (ref 1.7–7.7)
Neutrophils Relative %: 80 %
Platelets: 204 10*3/uL (ref 150–400)
RBC: 2.2 MIL/uL — ABNORMAL LOW (ref 3.87–5.11)
RDW: 13.2 % (ref 11.5–15.5)
WBC: 8.2 10*3/uL (ref 4.0–10.5)
nRBC: 0 % (ref 0.0–0.2)

## 2019-09-21 LAB — PREPARE RBC (CROSSMATCH)

## 2019-09-21 MED ORDER — ACETAMINOPHEN 325 MG PO TABS: 650.0000 mg | ORAL_TABLET | Freq: Four times a day (QID) | ORAL | Status: DC | PRN

## 2019-09-21 MED ORDER — SODIUM CHLORIDE 0.9 % IV SOLN
8.0000 mg/h | INTRAVENOUS | Status: DC
  Filled 2019-09-21: qty 80

## 2019-09-21 MED ORDER — ONDANSETRON HCL 4 MG PO TABS: 4.0000 mg | ORAL_TABLET | Freq: Four times a day (QID) | ORAL | Status: DC | PRN

## 2019-09-21 MED ORDER — SODIUM CHLORIDE 0.9 % IV SOLN
10.0000 mL/h | Freq: Once | INTRAVENOUS | Status: AC
  Administered 2019-09-21: 20:00:00 10 mL/h via INTRAVENOUS

## 2019-09-21 MED ORDER — SODIUM CHLORIDE 0.9% FLUSH
3.0000 mL | Freq: Two times a day (BID) | INTRAVENOUS | Status: DC
  Administered 2019-09-22 (×2): 3 mL via INTRAVENOUS

## 2019-09-21 MED ORDER — PANTOPRAZOLE SODIUM 40 MG IV SOLR
40.0000 mg | Freq: Two times a day (BID) | INTRAVENOUS | Status: DC
  Administered 2019-09-22 – 2019-09-23 (×3): 40 mg via INTRAVENOUS
  Filled 2019-09-21 (×3): qty 40

## 2019-09-21 MED ORDER — ACETAMINOPHEN 650 MG RE SUPP: 650.0000 mg | Freq: Four times a day (QID) | RECTAL | Status: DC | PRN

## 2019-09-21 MED ORDER — SODIUM CHLORIDE 0.9 % IV SOLN
80.0000 mg | Freq: Once | INTRAVENOUS | Status: DC
  Filled 2019-09-21: qty 80

## 2019-09-21 MED ORDER — SODIUM CHLORIDE 0.9 % IV SOLN
INTRAVENOUS | Status: AC
  Administered 2019-09-21: 22:00:00 via INTRAVENOUS

## 2019-09-21 MED ORDER — SODIUM CHLORIDE 0.9% IV SOLUTION
Freq: Once | INTRAVENOUS | Status: AC
  Administered 2019-09-21: 21:00:00 via INTRAVENOUS

## 2019-09-21 MED ORDER — CHLORHEXIDINE GLUCONATE CLOTH 2 % EX PADS: 6.0000 | MEDICATED_PAD | Freq: Every day | CUTANEOUS | Status: DC

## 2019-09-21 MED ORDER — SODIUM CHLORIDE 0.9% FLUSH: 10.0000 mL | INTRAVENOUS | Status: DC | PRN

## 2019-09-21 MED ORDER — ONDANSETRON HCL 4 MG/2ML IJ SOLN: 4.0000 mg | Freq: Four times a day (QID) | INTRAMUSCULAR | Status: DC | PRN

## 2019-09-21 MED ORDER — LEVOTHYROXINE SODIUM 88 MCG PO TABS
88.0000 ug | ORAL_TABLET | Freq: Every day | ORAL | Status: DC
  Administered 2019-09-22 – 2019-09-23 (×2): 88 ug via ORAL
  Filled 2019-09-21 (×2): qty 1

## 2019-09-21 MED ORDER — AMIODARONE HCL 200 MG PO TABS
200.0000 mg | ORAL_TABLET | Freq: Every day | ORAL | Status: DC
  Administered 2019-09-22 – 2019-09-23 (×2): 200 mg via ORAL
  Filled 2019-09-21 (×2): qty 1

## 2019-09-21 NOTE — ED Triage Notes (Signed)
Pt c/o rectal bleeding that started today. Takes Eliquis. Pt have cancer that is not getting treatments for as is a Hospice patient.

## 2019-09-21 NOTE — ED Provider Notes (Signed)
[facility] DEPT Provider Note   CSN: [id number] Arrival date & time: 09/21/19  1745     History   Chief Complaint Chief Complaint  Patient presents with  . Rectal Bleeding    HPI LATOYSHA WISHARD is a 78 y.o. female.     HPI This patient on hospice, with a history of ovarian malignancy presents with new rectal bleeding. Patient has multiple other medical issues including atrial fibrillation for which she takes Eliquis. She notes that over the past day she has had increasing amounts of blood in her toilet bowl.  On 4 is initially she had blood mixed with stool, now, after her most recent episode it has been largely blood, with minimal stool. There is no abdominal pain, no lightheadedness, no syncope.  On however, after the most recent episode the patient was diaphoretic, lightheaded, and on checking her own blood pressure found to have 70, systolic value. Patient has no prior similar episodes, has no history of bleeding. No abdominal pain, no chest pain, no dyspnea currently. Today, after she had persistent bleeding she spoke with her hospice coordinator. Hospice nurse evaluation was notable for demonstration of hemorrhoids, but otherwise reassuring, according to the patient. However, with ongoing bleeding she was sent here for evaluation. Past Medical History:  Diagnosis Date  . Arthritis    OSTEOARTHRITIS   -- CONSTANT PAIN RIGHT HIP---AND PAIN LEFT KNEE--PT STATES SHE GETS INJECTIONS INTO HER KNEE  . Complication of anesthesia    BLOOD PRESSURE DROPPED WITH NASAL SURGERY, ONE OF THE CARPAL TUNNEL REPAIRS AND DURING A COLONOSCOPY  . Dyslipidemia   . History of skin cancer   . Hypertension   . Hypothyroidism   . Menopausal symptoms   . NSTEMI (non-ST elevated myocardial infarction) ([place]) 12/09/15   Medical management: Distal branch of D1 95%, ostial D2 75%, distal LAD 50%. Tortuous arteries consistent with hypertension  . Ovarian cancer ([place])  01/2013   Recurrence since 2014/2016  . PAF (paroxysmal atrial fibrillation) ([place])    On ELIQUIS; On Amiodarone (Eye Exam 12/25/15) - no longer on Rythmol  . Stroke [facility]) 2017   no deficits    Patient Active Problem List   Diagnosis Date Noted  . Vertigo 01/30/2019  . Chemotherapy-induced nausea 08/15/2018  . Pancytopenia, acquired ([place]) 05/02/2018  . Goals of care, counseling/discussion 04/03/2018  . Chronic right-sided low back pain without sciatica 08/29/2017  . Closed nondisplaced fracture of head of right radius 08/15/2017  . 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 Status post revision of total knee replacement, left 03/18/2017  . Thrombocytopenia ([place]) 03/27/2016  . High risk medications (not anticoagulants) long-term use 03/27/2016  . Chronic anticoagulation 03/27/2016  . Atherosclerotic heart disease of native coronary artery without angina pectoris 03/03/2016  . Bilateral hearing loss 02/16/2016  . Bilateral impacted cerumen 02/16/2016  . Rhinitis, chronic 02/16/2016  . Embolic stroke ([place]) [id number]  . Vaginal atrophy 12/29/2015  . On amiodarone therapy 12/23/2015  . Dyslipidemia, goal LDL below 70   . Stroke with cerebral ischemia ([place])   . Visual disturbance   . Cerebral thrombosis with cerebral infarction 12/08/2015  . NSTEMI (non-ST elevated myocardial infarction) ([place]) 12/04/2015  . 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 Portacath in place 07/23/2015  . Recurrent carcinoma of ovary ([place]) 07/23/2015  . International Federation of Gynecology and Obstetrics (FIGO) 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[place]) 07/23/2015  .  Genetic testing 07/14/2015  . Encounter for antineoplastic chemotherapy 06/25/2015  . Port catheter in place 06/25/2015  . Antineoplastic chemotherapy induced pancytopenia ([place]) 06/02/2015  . Dehydration 06/02/2015  . 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 Chemotherapy induced neutropenia ([place]) 05/13/2015  . Chemotherapy induced thrombocytopenia 05/13/2015  . Hyperbilirubinemia 05/06/2015  . Osteoarthritis of left knee 01/03/2015  . Status post total left knee replacement 01/03/2015  . PAF (paroxysmal atrial fibrillation) ([place]): Symptomatic - Rhythm control with Amiodarone. CHA2DS2Vasc Score 7: On Eliquis   . Fever 05/03/2013    Class: Acute  . Nausea 05/03/2013    Class: Acute  . Other constipation 05/03/2013    Class: Acute  . Syncope, history of 04/19/2013  . Essential hypertension 04/19/2013  . Hypothyroidism 04/19/2013  . Ovarian cancer on right () 03/24/2013  . Degenerative arthritis of hip 02/25/2012    Past Surgical History:  Procedure Laterality Date  . BILATERAL CARPAL TUNNEL REPAIR  2007  . BREAST BIOPSY Left 03/19/2014   benign  . CARDIAC CATHETERIZATION N/A 12/05/2015   Procedure: Left Heart Cath and Coronary Angiography;  Surgeon: Belva Crome, MD;  Location: [facility];  Service: Cardiovascular;  distal branch of D1 95%, ostial D2 75%, dLAD 50%,p-mRCA 40%. Tortuous vessels.  Marland Kitchen DILATION AND CURETTAGE OF UTERUS  1969  . JOINT REPLACEMENT    . LAPAROTOMY Bilateral 02/13/2013   Procedure: EXPLORATORY LAPAROTOMY TOTAL ABDOMINAL HYSTERECTOMY BILATERAL SALPINGO-OOPHORECTOMY, Partial Rectal Resection with Reanastamosis;  Surgeon: Alvino Chapel, MD;  Location: [facility];  Service: Gynecology;  Laterality: Bilateral;  . LEFT KNEE ARTHROSCOPY   2011  . LYMPHADENECTOMY Right 02/13/2013   Procedure: PEVLIC  LYMPHADENECTOMY, DEBULKING right pelvic tumor nodules;  Surgeon: Alvino Chapel, MD;  Location: [facility];  Service: Gynecology;  Laterality: Right;  . NM MYOVIEW LTD  June 2010    subbmaximal with no ischemia or infarction.  . OMENTECTOMY  02/13/2013   Procedure: OMENTECTOMY;  Surgeon: Alvino Chapel, MD;  Location: [facility];  Service: Gynecology;;  .  [place]  . SURGERY FOR RUPTURED OVARIAN CYST  1969  . SYNOVECTOMY WITH POLY EXCHANGE Left 03/18/2017   Procedure: LEFT KNEE PARTIAL SYNOVECTOMY WITH POLY EXCHANGE;  Surgeon: Mcarthur Rossetti, MD;  Location: [facility];  Service: Orthopedics;  Laterality: Left;  Adductor Block  . TAH/BSO/Tumor debulking with right pelvic LND  01/2013  . TONSILLECTOMY  1962  . TOTAL HIP ARTHROPLASTY  02/25/2012   Procedure: TOTAL HIP ARTHROPLASTY ANTERIOR APPROACH;  Surgeon: Mcarthur Rossetti, MD;  Location: [facility];  Service: Orthopedics;  Laterality: Right;  . TOTAL KNEE ARTHROPLASTY Left 01/03/2015   Procedure: LEFT TOTAL KNEE ARTHROPLASTY;  Surgeon: Mcarthur Rossetti, MD;  Location: [facility];  Service: Orthopedics;  Laterality: Left;  . TRANSTHORACIC ECHOCARDIOGRAM  May 2014; January 2016   a. Normal LV size function. EF 60-65%. Grade 1 diastolic function. Mild MR and mildly elevated PA pressures of 37 mmHg per;; b. EF 60-65%. Mobile echodensity 10 mm x 6 mm attest interventricular septum is questionable fibroblastoma.  Marland Kitchen TRIGGER FINGER RELEASE Left 12/20/2017   Procedure: RELEASE TRIGGER FINGER/A-1 PULLEY LEFT RING FINGER;  Surgeon: Daryll Brod, MD;  Location: [facility];  Service: Orthopedics;  Laterality: Left;     OB History    Gravida  3   Para  2   Term      Preterm      AB  1   Living  3     SAB  1   TAB      Ectopic      Multiple  1   Live Births               Home Medications    Prior to Admission medications   Medication Sig Start Date End Date Taking? Authorizing Provider  acetaminophen (TYLENOL) 500 MG tablet Take 1,000 mg by mouth every 6 (six) hours as needed for mild pain.     [provider]  alendronate (FOSAMAX) 70 MG tablet Take 70 mg by mouth See admin instructions. Take with a full glass of water on an empty stomach. Twice Monthly    [provider]  amiodarone (PACERONE) 200 MG tablet Take 1  tablet (200 mg total) by mouth daily. 09/14/19   Leonie Man, MD  Biotin 10 MG TABS Take 20 mg by mouth daily.    [provider]  co-enzyme Q-10 30 MG capsule Take 100 mg by mouth daily.     [provider]  desonide (DESOWEN) 0.05 % cream Apply 1 application topically 2 (two) times daily.  12/23/14   [provider]  diphenhydrAMINE (BENADRYL) 25 MG tablet Take 50 mg by mouth at bedtime as needed for sleep.     [provider]  docusate sodium (COLACE) 100 MG capsule Take 250 mg by mouth 2 (two) times daily.     [provider]  ELIQUIS 5 MG TABS tablet TAKE 1 TABLET BY MOUTH TWICE A DAY Patient taking differently: Take 5 mg by mouth 2 (two) times daily.  04/26/19   Leonie Man, MD  Evolocumab with Infusor 420 MG/3.5ML SOCT Inject 420 mg into the skin every 30 (thirty) days.     [provider]  ezetimibe (ZETIA) 10 MG tablet Take 1 tablet (10 mg total) by mouth daily. Patient taking differently: Take 10 mg by mouth at bedtime.  02/24/16   Leonie Man, MD  Glucosamine HCl 1000 MG TABS Take 1,000 mg by mouth daily.    [provider]  hydrocortisone 1 % ointment Apply 1 application topically 2 (two) times daily as needed for itching (eczema).     [provider]  ipratropium (ATROVENT) 0.06 % nasal spray Place 2 sprays into both nostrils daily.  02/16/16   [provider]  lidocaine-prilocaine (EMLA) cream APPLY TO PORTA-CATH SITE 1-2 HOURS PRIOR TO ACCESS AS DIRECTED. 08/02/19   Cross, Lenna Sciara D, NP  magnesium hydroxide (MILK OF MAGNESIA) 800 MG/5ML suspension Take 30 mLs by mouth daily as needed for constipation. Reported on 11/20/2015    [provider]  Melatonin 5 MG CAPS Take 5 mg by mouth at bedtime as needed (sleep). Reported on 12/25/2015    [provider]  Menthol, Topical Analgesic, (ICY HOT EX) Apply 1 application topically 2 (two) times daily as needed (PAIN).     [provider]  Multiple Vitamin (MULTIVITAMIN WITH MINERALS) TABS tablet Take 1 tablet by mouth daily. Centrum Silver    [provider]  ondansetron (ZOFRAN) 8 MG tablet TAKE 1 TABLET (8 MG TOTAL) BY MOUTH EVERY 8 (EIGHT) HOURS AS NEEDED FOR NAUSEA OR VOMITING. Patient taking differently: Take 8 mg by mouth daily as needed for nausea or vomiting.  12/11/18   Heath Lark, MD  Polyethyl Glycol-Propyl Glycol (SYSTANE OP) Place 1 drop into both eyes 2 (two) times daily.     [provider]  polyethylene glycol (MIRALAX /  GLYCOLAX) packet Take 17 g by mouth 2 (two) times a day. Mix in 8 oz liquid and drink    [provider]  potassium chloride (K-DUR) 10 MEQ tablet Take 10 mEq by mouth daily. 03/05/19   [provider]  prochlorperazine (COMPAZINE) 10 MG tablet TAKE 1 TABLET (10 MG TOTAL) BY MOUTH EVERY 6 (SIX) HOURS AS NEEDED (NAUSEA OR VOMITING). Patient taking differently: Take 10 mg by mouth daily as needed (Nausea or vomiting).  03/05/19   Heath Lark, MD  SYNTHROID 88 MCG tablet Take 88 mcg by mouth daily. 05/06/19   [provider]    Family History Family History  Problem Relation Age of Onset  . Hypertension Mother   . Basal cell carcinoma Mother   . AAA (abdominal aortic aneurysm) Father   . Heart Problems Maternal Uncle   . Heart Problems Maternal Grandfather   . AAA (abdominal aortic aneurysm) Paternal Grandmother   . Prostate cancer Maternal Uncle 70  . Prostate cancer Other   . Other Daughter        one daughter had TAH-BSO at 79; other daughter will have one soon    Social History Social History   Tobacco Use  . Smoking status: Never Smoker  . Smokeless tobacco: Never Used  Substance Use Topics  . Alcohol use: No    Alcohol/week: 0.0 standard drinks  . Drug use: No     Allergies   Clonidine derivatives, Coreg [carvedilol], Augmentin [amoxicillin-pot clavulanate], Caffeine, Codeine, Flexeril [cyclobenzaprine], Lipitor  [atorvastatin], Lisinopril, Pravastatin, Tape, Tegaderm ag mesh [silver], and Ultram [tramadol]   Review of Systems Review of Systems  Constitutional:       Per HPI, otherwise negative  HENT:       Per HPI, otherwise negative  Respiratory:       Per HPI, otherwise negative  Cardiovascular:       Per HPI, otherwise negative  Gastrointestinal: Positive for blood in stool. Negative for abdominal pain and vomiting.  Endocrine:       Negative aside from HPI  Genitourinary:       Neg aside from HPI   Musculoskeletal:       Per HPI, otherwise negative  Skin: Negative.   Neurological: Negative for syncope.     Physical Exam Updated Vital Signs BP 136/81   Pulse 84   Temp 98.9 F (37.2 C) (Oral)   Resp 19   SpO2 100%   Physical Exam Vitals signs and nursing note reviewed.  Constitutional:      General: She is not in acute distress.    Appearance: She is well-developed.  HENT:     Head: Normocephalic and atraumatic.  Eyes:     Conjunctiva/sclera: Conjunctivae normal.  Cardiovascular:     Rate and Rhythm: Normal rate and regular rhythm.  Pulmonary:     Effort: Pulmonary effort is normal. No respiratory distress.     Breath sounds: Normal breath sounds. No stridor.  Abdominal:     General: There is no distension.     Tenderness: There is no abdominal tenderness. There is no guarding.  Skin:    General: Skin is warm and dry.  Neurological:     Mental Status: She is alert and oriented to person, place, and time.     Cranial Nerves: No cranial nerve deficit.      ED Treatments / Results  Labs (all labs ordered are listed, but only abnormal results are displayed) Labs Reviewed  COMPREHENSIVE METABOLIC PANEL -  Abnormal; Notable for the following components:      Result Value   Glucose, Bld 115 (*)    Calcium 8.7 (*)    Total Protein 5.7 (*)    Albumin 3.0 (*)    All other components within normal limits  CBC WITH DIFFERENTIAL/PLATELET - Abnormal; Notable for the  following components:   RBC 2.20 (*)    Hemoglobin 7.5 (*)    HCT 23.6 (*)    MCV 107.3 (*)    MCH 34.1 (*)    All other components within normal limits  SARS CORONAVIRUS 2 (TAT 6-24 HRS)  TYPE AND SCREEN  PREPARE RBC (CROSSMATCH)    Procedures Procedures (including critical care time)  Medications Ordered in ED Medications  amiodarone (PACERONE) tablet 200 mg (has no administration in time range)  levothyroxine (SYNTHROID) tablet 88 mcg (has no administration in time range)  sodium chloride flush (NS) 0.9 % injection 3 mL (has no administration in time range)  0.9 %  sodium chloride infusion ( Intravenous New Bag/Given 09/21/19 2152)  acetaminophen (TYLENOL) tablet 650 mg (has no administration in time range)    Or  acetaminophen (TYLENOL) suppository 650 mg (has no administration in time range)  ondansetron (ZOFRAN) tablet 4 mg (has no administration in time range)    Or  ondansetron (ZOFRAN) injection 4 mg (has no administration in time range)  pantoprazole (PROTONIX) injection 40 mg (has no administration in time range)  0.9 %  sodium chloride infusion (0 mL/hr Intravenous Stopped 09/21/19 2112)  0.9 %  sodium chloride infusion (Manually program via Guardrails IV Fluids) ( Intravenous New Bag/Given 09/21/19 2111)     Initial Impression / Assessment and Plan / ED Course  I have reviewed the triage vital signs and the nursing notes.  Pertinent labs & imaging results that were available during my care of the patient were reviewed by me and considered in my medical decision making (see chart for details).        7:21 PM Initial blood work notable for hemoglobin 7.5.  However, the patient has had another episode of acute bleed, with bright red blood filling bedside commode, and near syncope, unresponsiveness, transiently. However, the patient awakened following this episode. Patient's husband is now present.  We discussed all findings including concern for ongoing  bleeding, while on Eliquis.  I discussed the patient's case with our gastroenterology colleague, Dr. Ardis Hughs. With concern for ongoing bleeding, patient will have transfusion, PPI is infusing as well. Though the patient is on hospice, this episode, with bleeding in the context of ongoing Eliquis use requires, is appropriate for emergent intervention with blood transfusion, admission for further monitoring, management. After the patient's most recent episode of acute bleeding in the ED she had no additional episodes prior to admission to the floor.  Final Clinical Impressions(s) / ED Diagnoses   Final diagnoses:  Acute GI bleeding   CRITICAL CARE Performed by: Carmin Muskrat Total critical care time: 35 minutes Critical care time was exclusive of separately billable procedures and treating other patients. Critical care was necessary to treat or prevent imminent or life-threatening deterioration. Critical care was time spent personally by me on the following activities: development of treatment plan with patient and/or surrogate as well as nursing, discussions with consultants, evaluation of patient's response to treatment, examination of patient, obtaining history from patient or surrogate, ordering and performing treatments and interventions, ordering and review of laboratory studies, ordering and review of radiographic studies, pulse oximetry and re-evaluation of patient's  condition.    Carmin Muskrat, MD 09/21/19 2255

## 2019-09-21 NOTE — ED Notes (Signed)
ED TO INPATIENT HANDOFF REPORT  Name/Age/Gender [name] 78 y.o. female  Code Status Code Status History    Date Active Date Inactive Code Status Order ID Comments User Context   03/18/2017 1453 03/19/2017 1605 Full Code [id number]  Mcarthur Rossetti, MD Inpatient   12/05/2015 1009 12/09/2015 1617 Full Code [id number]  Belva Crome, MD Inpatient   12/04/2015 0024 12/05/2015 1009 Full Code [id number]  Vianne Bulls, MD ED   04/21/2015 1221 04/22/2015 0326 Full Code [id number]  Arne Cleveland, MD HOV   01/03/2015 1123 01/06/2015 1451 Full Code [id number]  Mcarthur Rossetti, MD Inpatient   02/13/2013 1655 02/20/2013 1629 Full Code [id number]  Lahoma Crocker, MD Inpatient   02/25/2012 1118 02/28/2012 1448 Full Code [id number]  Yvonna Alanis, RN Inpatient   Advance Care Planning Activity      Home/SNF/Other Home  Chief Complaint Rectal Bleeding  Level of Care/Admitting Diagnosis ED Disposition    ED Disposition Condition [facility] Area: [facility] [100102]  Level of Care: Telemetry [5]  Admit to tele based on following criteria: Other see comments  Comments: acute GIB  Covid Evaluation: Asymptomatic Screening Protocol (No Symptoms)  Diagnosis: Acute GI bleeding [id number]  Admitting Physician: Vianne Bulls [id number]  Attending Physician: Vianne Bulls [id number]  PT Class (Do Not Modify): Observation [104]  PT Acc Code (Do Not Modify): Observation [10022]       Medical History Past Medical History:  Diagnosis Date  . Arthritis    OSTEOARTHRITIS   -- CONSTANT PAIN RIGHT HIP---AND PAIN LEFT KNEE--PT STATES SHE GETS INJECTIONS INTO HER KNEE  . Complication of anesthesia    BLOOD PRESSURE DROPPED WITH NASAL SURGERY, ONE OF THE CARPAL TUNNEL REPAIRS AND DURING A COLONOSCOPY  . Dyslipidemia   . History of skin cancer   . Hypertension   . Hypothyroidism   . Menopausal symptoms   . NSTEMI (non-ST elevated myocardial infarction) ([place])  12/09/15   Medical management: Distal branch of D1 95%, ostial D2 75%, distal LAD 50%. Tortuous arteries consistent with hypertension  . Ovarian cancer ([place]) 01/2013   Recurrence since 2014/2016  . PAF (paroxysmal atrial fibrillation) ([place])    On ELIQUIS; On Amiodarone (Eye Exam 12/25/15) - no longer on Rythmol  . Stroke [facility]) 2017   no deficits    Allergies Allergies  Allergen Reactions  . Clonidine Derivatives Shortness Of Breath  . Coreg [Carvedilol] Shortness Of Breath  . Augmentin [Amoxicillin-Pot Clavulanate] Diarrhea  . Caffeine Other (See Comments)    Makes heart race  . Codeine Other (See Comments)    Does not like the feeling she gets  . Flexeril [Cyclobenzaprine] Other (See Comments)    Pt states "increased heart rate"  . Lipitor [Atorvastatin] Dermatitis    "feels like bugs are biting her"   . Lisinopril     LIP NUMBNESS  . Pravastatin Other (See Comments)    FEELS LIKE "BUG BITES" BITING HER LEGS   . Tape Rash    TEGADERM.   (use opsite on PAC)  . Tegaderm Ag Mesh [Silver] Rash and Other (See Comments)    Burns skin  . Ultram [Tramadol] Palpitations    IV Location/Drains/Wounds Patient Lines/Drains/Airways Status   Active Line/Drains/Airways    Name:   Placement date:   Placement time:   Site:   Days:   Peripheral IV 09/21/19 Left Antecubital   09/21/19    1856    Antecubital  less than 1          Labs/Imaging Results for orders placed or performed during the hospital encounter of 09/21/19 (from the past 48 hour(s))  Comprehensive metabolic panel     Status: Abnormal   Collection Time: 09/21/19  6:50 PM  Result Value Ref Range   Sodium 136 135 - 145 mmol/L   Potassium 4.3 3.5 - 5.1 mmol/L   Chloride 101 98 - 111 mmol/L   CO2 27 22 - 32 mmol/L   Glucose, Bld 115 (H) 70 - 99 mg/dL   BUN 20 8 - 23 mg/dL   Creatinine, Ser 0.74 0.44 - 1.00 mg/dL   Calcium 8.7 (L) 8.9 - 10.3 mg/dL   Total Protein 5.7 (L) 6.5 - 8.1 g/dL   Albumin 3.0 (L) 3.5 - 5.0 g/dL    AST 18 15 - 41 U/L   ALT 15 0 - 44 U/L   Alkaline Phosphatase 56 38 - 126 U/L   Total Bilirubin 0.5 0.3 - 1.2 mg/dL   GFR calc non Af Amer >60 >60 mL/min   GFR calc Af Amer >60 >60 mL/min   Anion gap 8 5 - 15    Comment: Performed at [facility], [place] [address] [place], [place] [address]  CBC with Differential     Status: Abnormal   Collection Time: 09/21/19  6:50 PM  Result Value Ref Range   WBC 8.2 4.0 - 10.5 K/uL   RBC 2.20 (L) 3.87 - 5.11 MIL/uL   Hemoglobin 7.5 (L) 12.0 - 15.0 g/dL   HCT 23.6 (L) 36.0 - 46.0 %   MCV 107.3 (H) 80.0 - 100.0 fL   MCH 34.1 (H) 26.0 - 34.0 pg   MCHC 31.8 30.0 - 36.0 g/dL   RDW 13.2 11.5 - 15.5 %   Platelets 204 150 - 400 K/uL   nRBC 0.0 0.0 - 0.2 %   Neutrophils Relative % 80 %   Neutro Abs 6.6 1.7 - 7.7 K/uL   Lymphocytes Relative 14 %   Lymphs Abs 1.1 0.7 - 4.0 K/uL   Monocytes Relative 5 %   Monocytes Absolute 0.4 0.1 - 1.0 K/uL   Eosinophils Relative 0 %   Eosinophils Absolute 0.0 0.0 - 0.5 K/uL   Basophils Relative 0 %   Basophils Absolute 0.0 0.0 - 0.1 K/uL   Immature Granulocytes 1 %   Abs Immature Granulocytes 0.05 0.00 - 0.07 K/uL    Comment: Performed at [facility], [place] [address] [place], [place] [address]  Type and screen [place]     Status: None (Preliminary result)   Collection Time: 09/21/19  6:50 PM  Result Value Ref Range   ABO/RH(D) A POS    Antibody Screen NEG    Sample Expiration      09/24/2019,2359 Performed at [facility], [place] [address] [place], [place] [address]    Unit Number [id number]    Blood Component Type RED CELLS,LR    Unit division 00    Status of Unit ALLOCATED    Transfusion Status OK TO TRANSFUSE    Crossmatch Result Compatible    Unit Number [id number]    Blood Component Type RED CELLS,LR    Unit division 00    Status of Unit ALLOCATED    Transfusion Status OK TO TRANSFUSE    Crossmatch Result  Compatible   Prepare RBC     Status: None   Collection Time: 09/21/19  6:50  PM  Result Value Ref Range   Order Confirmation      ORDER PROCESSED BY BLOOD BANK Performed at [facility] [address], [place], [place] [address]    *Note: Due to a large number of results and/or encounters for the requested time period, some results have not been displayed. A complete set of results can be found in Results Review.   No results found.  Pending Labs [employer] (From admission, onward)    Start     Ordered   09/21/19 1920  SARS CORONAVIRUS 2 (TAT 6-24 HRS) Nasopharyngeal Nasopharyngeal Swab  (Asymptomatic/Tier 2 Patients Labs)  Once,   STAT    Question Answer Comment  Is this test for diagnosis or screening Screening   Symptomatic for COVID-19 as defined by CDC No   Hospitalized for COVID-19 No   Admitted to ICU for COVID-19 No   Previously tested for COVID-19 Yes   Resident in a congregate (group) care setting No   Employed in healthcare setting No   Pregnant No      09/21/19 1920          Vitals/Pain Today's Vitals   09/21/19 1801 09/21/19 1802 09/21/19 1913 09/21/19 1917  BP: 136/81  136/89 131/69  Pulse: 84  84 77  Resp: 19   19  Temp: 98.9 F (37.2 C)     TempSrc: Oral     SpO2: 100%  99% 96%  PainSc:  0-No pain      Isolation Precautions No active isolations  Medications Medications  pantoprazole (PROTONIX) 80 mg in sodium chloride 0.9 % 100 mL IVPB (has no administration in time range)  pantoprazole (PROTONIX) 80 mg in sodium chloride 0.9 % 250 mL (0.32 mg/mL) infusion (has no administration in time range)  0.9 %  sodium chloride infusion (10 mL/hr Intravenous New Bag/Given 09/21/19 1945)    Mobility walks

## 2019-09-21 NOTE — H&P (Signed)
History and Physical    [name] [id number] DOB: [birth year] DOA: 09/21/2019  PCP: Tina Baton, MD   Patient coming from: Home   Chief Complaint: Rectal bleeding, lightheadedness   HPI: [name] is a 78 y.o. female with medical history significant for ovarian cancer, atrial fibrillation on Eliquis, and hypothyroidism, now presenting to the emergency department with rectal bleeding, fatigue, lightheadedness.  Patient reports that she developed bright red blood per rectum today, has had multiple episodes, and has had increased fatigue and lightheadedness/near syncope.  She has not had any abdominal pain, nausea, vomiting, or indigestion associated with this.  She denies prior experiences with similar symptoms.  Denies recent fever, chills, or cough.  ED Course: Upon arrival to the ED, patient is found to be afebrile, saturating well on room air, and with stable blood pressure and normal heart rate.  Chemistry panel is unremarkable and CBC notable for microcytic anemia with hemoglobin 7.5, down from 9.7 a week earlier.  2 units of packed red blood cells were ordered for immediate transfusion and the patient was started on Protonix infusion in the ED.  COVID-19 testing is in process.  Gastroenterology was consulted by the ED physician.  Review of Systems:  All other systems reviewed and apart from HPI, are negative.  Past Medical History:  Diagnosis Date  . Arthritis    OSTEOARTHRITIS   -- CONSTANT PAIN RIGHT HIP---AND PAIN LEFT KNEE--PT STATES SHE GETS INJECTIONS INTO HER KNEE  . Complication of anesthesia    BLOOD PRESSURE DROPPED WITH NASAL SURGERY, ONE OF THE CARPAL TUNNEL REPAIRS AND DURING A COLONOSCOPY  . Dyslipidemia   . History of skin cancer   . Hypertension   . Hypothyroidism   . Menopausal symptoms   . NSTEMI (non-ST elevated myocardial infarction) ([place]) 12/09/15   Medical management: Distal branch of D1 95%, ostial D2 75%, distal LAD 50%. Tortuous arteries  consistent with hypertension  . Ovarian cancer ([place]) 01/2013   Recurrence since 2014/2016  . PAF (paroxysmal atrial fibrillation) ([place])    On ELIQUIS; On Amiodarone (Eye Exam 12/25/15) - no longer on Rythmol  . Stroke [facility]) 2017   no deficits    Past Surgical History:  Procedure Laterality Date  . BILATERAL CARPAL TUNNEL REPAIR  2007  . BREAST BIOPSY Left 03/19/2014   benign  . CARDIAC CATHETERIZATION N/A 12/05/2015   Procedure: Left Heart Cath and Coronary Angiography;  Surgeon: Belva Crome, MD;  Location: [place] [facility];  Service: Cardiovascular;  distal branch of D1 95%, ostial D2 75%, dLAD 50%,p-mRCA 40%. Tortuous vessels.  Marland Kitchen DILATION AND CURETTAGE OF UTERUS  1969  . JOINT REPLACEMENT    . LAPAROTOMY Bilateral 02/13/2013   Procedure: EXPLORATORY LAPAROTOMY TOTAL ABDOMINAL HYSTERECTOMY BILATERAL SALPINGO-OOPHORECTOMY, Partial Rectal Resection with Reanastamosis;  Surgeon: Alvino Chapel, MD;  Location: [facility];  Service: Gynecology;  Laterality: Bilateral;  . LEFT KNEE ARTHROSCOPY   2011  . LYMPHADENECTOMY Right 02/13/2013   Procedure: PEVLIC  LYMPHADENECTOMY, DEBULKING right pelvic tumor nodules;  Surgeon: Alvino Chapel, MD;  Location: [facility];  Service: Gynecology;  Laterality: Right;  . NM MYOVIEW LTD  June 2010    subbmaximal with no ischemia or infarction.  . OMENTECTOMY  02/13/2013   Procedure: OMENTECTOMY;  Surgeon: Alvino Chapel, MD;  Location: [facility];  Service: Gynecology;;  . [place]  . SURGERY FOR RUPTURED OVARIAN CYST  1969  . SYNOVECTOMY WITH POLY EXCHANGE Left 03/18/2017  Procedure: LEFT KNEE PARTIAL SYNOVECTOMY WITH POLY EXCHANGE;  Surgeon: Mcarthur Rossetti, MD;  Location: [facility];  Service: Orthopedics;  Laterality: Left;  Adductor Block  . TAH/BSO/Tumor debulking with right pelvic LND  01/2013  . TONSILLECTOMY  1962  . TOTAL HIP ARTHROPLASTY  02/25/2012   Procedure: TOTAL HIP ARTHROPLASTY ANTERIOR  APPROACH;  Surgeon: Mcarthur Rossetti, MD;  Location: [facility];  Service: Orthopedics;  Laterality: Right;  . TOTAL KNEE ARTHROPLASTY Left 01/03/2015   Procedure: LEFT TOTAL KNEE ARTHROPLASTY;  Surgeon: Mcarthur Rossetti, MD;  Location: [facility];  Service: Orthopedics;  Laterality: Left;  . TRANSTHORACIC ECHOCARDIOGRAM  May 2014; January 2016   a. Normal LV size function. EF 60-65%. Grade 1 diastolic function. Mild MR and mildly elevated PA pressures of 37 mmHg per;; b. EF 60-65%. Mobile echodensity 10 mm x 6 mm attest interventricular septum is questionable fibroblastoma.  Marland Kitchen TRIGGER FINGER RELEASE Left 12/20/2017   Procedure: RELEASE TRIGGER FINGER/A-1 PULLEY LEFT RING FINGER;  Surgeon: Daryll Brod, MD;  Location: [place];  Service: Orthopedics;  Laterality: Left;     reports that she has never smoked. She has never used smokeless tobacco. She reports that she does not drink alcohol or use drugs.  Allergies  Allergen Reactions  . Clonidine Derivatives Shortness Of Breath  . Coreg [Carvedilol] Shortness Of Breath  . Augmentin [Amoxicillin-Pot Clavulanate] Diarrhea  . Caffeine Other (See Comments)    Makes heart race  . Codeine Other (See Comments)    Does not like the feeling she gets  . Flexeril [Cyclobenzaprine] Other (See Comments)    Pt states "increased heart rate"  . Lipitor [Atorvastatin] Dermatitis    "feels like bugs are biting her"   . Lisinopril     LIP NUMBNESS  . Pravastatin Other (See Comments)    FEELS LIKE "BUG BITES" BITING HER LEGS   . Tape Rash    TEGADERM.   (use opsite on PAC)  . Tegaderm Ag Mesh [Silver] Rash and Other (See Comments)    Burns skin  . Ultram [Tramadol] Palpitations    Family History  Problem Relation Age of Onset  . Hypertension Mother   . Basal cell carcinoma Mother   . AAA (abdominal aortic aneurysm) Father   . Heart Problems Maternal Uncle   . Heart Problems Maternal Grandfather   . AAA (abdominal aortic aneurysm)  Paternal Grandmother   . Prostate cancer Maternal Uncle 70  . Prostate cancer Other   . Other Daughter        one daughter had TAH-BSO at 46; other daughter will have one soon     Prior to Admission medications   Medication Sig Start Date End Date Taking? Authorizing Provider  acetaminophen (TYLENOL) 500 MG tablet Take 1,000 mg by mouth every 6 (six) hours as needed for mild pain.     [provider]  alendronate (FOSAMAX) 70 MG tablet Take 70 mg by mouth See admin instructions. Take with a full glass of water on an empty stomach. Twice Monthly    [provider]  amiodarone (PACERONE) 200 MG tablet Take 1 tablet (200 mg total) by mouth daily. 09/14/19   Leonie Man, MD  Biotin 10 MG TABS Take 20 mg by mouth daily.    [provider]  co-enzyme Q-10 30 MG capsule Take 100 mg by mouth daily.     [provider]  desonide (DESOWEN) 0.05 % cream Apply 1 application topically 2 (two) times daily.  12/23/14   [provider]  diphenhydrAMINE (BENADRYL) 25 MG tablet Take 50 mg by mouth at bedtime as needed for sleep.     [provider]  docusate sodium (COLACE) 100 MG capsule Take 250 mg by mouth 2 (two) times daily.     [provider]  ELIQUIS 5 MG TABS tablet TAKE 1 TABLET BY MOUTH TWICE A DAY Patient taking differently: Take 5 mg by mouth 2 (two) times daily.  04/26/19   Leonie Man, MD  Evolocumab with Infusor 420 MG/3.5ML SOCT Inject 420 mg into the skin every 30 (thirty) days.     [provider]  ezetimibe (ZETIA) 10 MG tablet Take 1 tablet (10 mg total) by mouth daily. Patient taking differently: Take 10 mg by mouth at bedtime.  02/24/16   Leonie Man, MD  Glucosamine HCl 1000 MG TABS Take 1,000 mg by mouth daily.    [provider]  hydrocortisone 1 % ointment Apply 1 application topically 2 (two) times daily as needed for itching (eczema).     [provider]  ipratropium (ATROVENT)  0.06 % nasal spray Place 2 sprays into both nostrils daily.  02/16/16   [provider]  lidocaine-prilocaine (EMLA) cream APPLY TO PORTA-CATH SITE 1-2 HOURS PRIOR TO ACCESS AS DIRECTED. 08/02/19   Cross, Lenna Sciara D, NP  magnesium hydroxide (MILK OF MAGNESIA) 800 MG/5ML suspension Take 30 mLs by mouth daily as needed for constipation. Reported on 11/20/2015    [provider]  Melatonin 5 MG CAPS Take 5 mg by mouth at bedtime as needed (sleep). Reported on 12/25/2015    [provider]  Menthol, Topical Analgesic, (ICY HOT EX) Apply 1 application topically 2 (two) times daily as needed (PAIN).     [provider]  Multiple Vitamin (MULTIVITAMIN WITH MINERALS) TABS tablet Take 1 tablet by mouth daily. Centrum Silver    [provider]  ondansetron (ZOFRAN) 8 MG tablet TAKE 1 TABLET (8 MG TOTAL) BY MOUTH EVERY 8 (EIGHT) HOURS AS NEEDED FOR NAUSEA OR VOMITING. Patient taking differently: Take 8 mg by mouth daily as needed for nausea or vomiting.  12/11/18   Heath Lark, MD  Polyethyl Glycol-Propyl Glycol (SYSTANE OP) Place 1 drop into both eyes 2 (two) times daily.     [provider]  polyethylene glycol (MIRALAX / GLYCOLAX) packet Take 17 g by mouth 2 (two) times a day. Mix in 8 oz liquid and drink    [provider]  potassium chloride (K-DUR) 10 MEQ tablet Take 10 mEq by mouth daily. 03/05/19   [provider]  prochlorperazine (COMPAZINE) 10 MG tablet TAKE 1 TABLET (10 MG TOTAL) BY MOUTH EVERY 6 (SIX) HOURS AS NEEDED (NAUSEA OR VOMITING). Patient taking differently: Take 10 mg by mouth daily as needed (Nausea or vomiting).  03/05/19   Heath Lark, MD  SYNTHROID 88 MCG tablet Take 88 mcg by mouth daily. 05/06/19   [provider]    Physical Exam: Vitals:   09/21/19 1800 09/21/19 1801 09/21/19 1913 09/21/19 1917  BP: 136/81 136/81 136/89 131/69  Pulse:  84 84 77  Resp:  19  19  Temp:  98.9 F (37.2 C)    TempSrc:  Oral     SpO2:  100% 99% 96%    Constitutional: NAD, calm  Eyes: PERTLA, lids and conjunctivae normal ENMT: Mucous membranes are moist. Posterior pharynx clear of any exudate or lesions.   Neck: normal, supple, no masses, no thyromegaly  Respiratory: no wheezing, no crackles. Normal respiratory effort. No accessory muscle use.  Cardiovascular: Rate ~80 and irregularly irregular. No extremity edema.   Abdomen: No distension, no tenderness, soft. Bowel sounds active.  Musculoskeletal: no clubbing / cyanosis. No joint deformity upper and lower extremities.   Skin: no significant rashes, lesions, ulcers. Warm, dry, well-perfused. Neurologic: No facial asymmetry. Sensation intact. Moving all extremities.  Psychiatric: Alert and oriented x 3. Calm, cooperative.    Labs on Admission: I have personally reviewed following labs and imaging studies  CBC: Recent Labs  Lab 09/21/19 1850  WBC 8.2  NEUTROABS 6.6  HGB 7.5*  HCT 23.6*  MCV 107.3*  PLT 0000000   Basic Metabolic Panel: Recent Labs  Lab 09/21/19 1850  NA 136  K 4.3  CL 101  CO2 27  GLUCOSE 115*  BUN 20  CREATININE 0.74  CALCIUM 8.7*   GFR: CrCl cannot be calculated (Unknown ideal weight.). Liver Function Tests: Recent Labs  Lab 09/21/19 1850  AST 18  ALT 15  ALKPHOS 56  BILITOT 0.5  PROT 5.7*  ALBUMIN 3.0*   No results for input(s): LIPASE, AMYLASE in the last 168 hours. No results for input(s): AMMONIA in the last 168 hours. Coagulation Profile: No results for input(s): INR, PROTIME in the last 168 hours. Cardiac Enzymes: No results for input(s): CKTOTAL, CKMB, CKMBINDEX, TROPONINI in the last 168 hours. BNP (last 3 results) No results for input(s): PROBNP in the last 8760 hours. HbA1C: No results for input(s): HGBA1C in the last 72 hours. CBG: No results for input(s): GLUCAP in the last 168 hours. Lipid Profile: No results for input(s): CHOL, HDL, LDLCALC, TRIG, CHOLHDL, LDLDIRECT in the last 72 hours. Thyroid  Function Tests: No results for input(s): TSH, T4TOTAL, FREET4, T3FREE, THYROIDAB in the last 72 hours. Anemia Panel: No results for input(s): VITAMINB12, FOLATE, FERRITIN, TIBC, IRON, RETICCTPCT in the last 72 hours. Urine analysis:    Component Value Date/Time   COLORURINE YELLOW 09/13/2019 1115   APPEARANCEUR HAZY (A) 09/13/2019 1115   LABSPEC 1.018 09/13/2019 1115   LABSPEC 1.005 05/28/2015 1512   PHURINE 5.0 09/13/2019 1115   GLUCOSEU NEGATIVE 09/13/2019 1115   GLUCOSEU Negative 05/28/2015 1512   HGBUR NEGATIVE 09/13/2019 1115   BILIRUBINUR NEGATIVE 09/13/2019 1115   BILIRUBINUR Negative 05/28/2015 1512   [place] 09/13/2019 1115   PROTEINUR 30 (A) 09/13/2019 1115   UROBILINOGEN 0.2 05/28/2015 1512   NITRITE NEGATIVE 09/13/2019 1115   LEUKOCYTESUR TRACE (A) 09/13/2019 1115   LEUKOCYTESUR Negative 05/28/2015 1512   Sepsis Labs: @LABRCNTIP (procalcitonin:4,lacticidven:4) ) Recent Results (from the past 240 hour(s))  Urine Culture     Status: Abnormal   Collection Time: 09/13/19 11:15 AM   Specimen: Urine, Random  Result Value Ref Range Status   Specimen Description   Final    URINE, RANDOM Performed at [facility], [place] [address] [place], [place] [address]    Special Requests   Final    NONE Performed at [facility], [place] [address], [place], [place] [address]    Culture MULTIPLE SPECIES PRESENT, SUGGEST RECOLLECTION (A)  Final   Report Status 09/14/2019 FINAL  Final     Radiological Exams on Admission: No results found.  EKG: Not performed.   Assessment/Plan   1. Acute GI bleeding; symptomatic anemia  - Presents with one day of rectal bleeding, lightheadedness, and increased fatigue  - Initial Hgb is 7.5, down from 9.7 a week earlier; she has not been tachycardic  or hypotensive in ED  - She describes the blood as bright red, denies any associated pain, indigestion, nausea, or vomiting, and BUN is normal and  decreased, all suggestive of lower source   - GI is consulting and much appreciated   - 2 unit RBC transfusion ordered from ED  - Hold Eliquis, clear liquid diet for now, check post-transfusion CBC    2. Ovarian cancer  - Unfortunately, she had disease progression on chemo and multiple ED visits d/t complications of treatment, has not had treatment since June and is now on hospice  3. Hypothyroidism  - Continue Synthroid    4. Atrial fibrillation  - In rate-controlled a fib in ED  - CHADS-VASc is at least 31 (age x2, CVA x2, gender)  - Eliquis held on admission in light of GIB, continue amiodarone as tolerated    PPE: Mask, face shield  DVT prophylaxis: Eliquis pta, held on admission   Code Status: DNR, confirmed with patient   Family Communication: Husband updated at bedside  Consults called: GI consulted by ED physician  Admission status: Observation     Vianne Bulls, MD [facility] Pager [phone]  If 7PM-7AM, please contact night-coverage [email or web address] Password [facility]  09/21/2019, 8:37 PM

## 2019-09-22 ENCOUNTER — Encounter (HOSPITAL_COMMUNITY): Payer: Self-pay

## 2019-09-22 ENCOUNTER — Encounter (HOSPITAL_COMMUNITY): Payer: Self-pay | Admitting: Anesthesiology

## 2019-09-22 ENCOUNTER — Encounter (HOSPITAL_COMMUNITY)
Admission: EM | Disposition: A | Discharge status: Hospice | Payer: Self-pay | Source: Home / Self Care | Attending: Internal Medicine

## 2019-09-22 DIAGNOSIS — Z7983 Long term (current) use of bisphosphonates: Secondary | ICD-10-CM | POA: Diagnosis not present

## 2019-09-22 DIAGNOSIS — Z96641 Presence of right artificial hip joint: Secondary | ICD-10-CM | POA: Diagnosis present

## 2019-09-22 DIAGNOSIS — E039 Hypothyroidism, unspecified: Secondary | ICD-10-CM | POA: Diagnosis present

## 2019-09-22 DIAGNOSIS — K649 Unspecified hemorrhoids: Secondary | ICD-10-CM | POA: Diagnosis present

## 2019-09-22 DIAGNOSIS — Z7901 Long term (current) use of anticoagulants: Secondary | ICD-10-CM | POA: Diagnosis not present

## 2019-09-22 DIAGNOSIS — C569 Malignant neoplasm of unspecified ovary: Secondary | ICD-10-CM | POA: Diagnosis present

## 2019-09-22 DIAGNOSIS — Z8673 Personal history of transient ischemic attack (TIA), and cerebral infarction without residual deficits: Secondary | ICD-10-CM | POA: Diagnosis not present

## 2019-09-22 DIAGNOSIS — Z79890 Hormone replacement therapy (postmenopausal): Secondary | ICD-10-CM | POA: Diagnosis not present

## 2019-09-22 DIAGNOSIS — Z20828 Contact with and (suspected) exposure to other viral communicable diseases: Secondary | ICD-10-CM | POA: Diagnosis present

## 2019-09-22 DIAGNOSIS — M1712 Unilateral primary osteoarthritis, left knee: Secondary | ICD-10-CM | POA: Diagnosis present

## 2019-09-22 DIAGNOSIS — Z85828 Personal history of other malignant neoplasm of skin: Secondary | ICD-10-CM | POA: Diagnosis not present

## 2019-09-22 DIAGNOSIS — D490 Neoplasm of unspecified behavior of digestive system: Secondary | ICD-10-CM

## 2019-09-22 DIAGNOSIS — Z79899 Other long term (current) drug therapy: Secondary | ICD-10-CM | POA: Diagnosis not present

## 2019-09-22 DIAGNOSIS — I480 Paroxysmal atrial fibrillation: Secondary | ICD-10-CM | POA: Diagnosis present

## 2019-09-22 DIAGNOSIS — Z66 Do not resuscitate: Secondary | ICD-10-CM | POA: Diagnosis present

## 2019-09-22 DIAGNOSIS — D649 Anemia, unspecified: Secondary | ICD-10-CM | POA: Diagnosis not present

## 2019-09-22 DIAGNOSIS — M1611 Unilateral primary osteoarthritis, right hip: Secondary | ICD-10-CM | POA: Diagnosis present

## 2019-09-22 DIAGNOSIS — K921 Melena: Principal | ICD-10-CM

## 2019-09-22 DIAGNOSIS — I10 Essential (primary) hypertension: Secondary | ICD-10-CM | POA: Diagnosis present

## 2019-09-22 DIAGNOSIS — Z9221 Personal history of antineoplastic chemotherapy: Secondary | ICD-10-CM | POA: Diagnosis not present

## 2019-09-22 DIAGNOSIS — D509 Iron deficiency anemia, unspecified: Secondary | ICD-10-CM | POA: Diagnosis present

## 2019-09-22 DIAGNOSIS — Z515 Encounter for palliative care: Secondary | ICD-10-CM | POA: Diagnosis present

## 2019-09-22 DIAGNOSIS — I252 Old myocardial infarction: Secondary | ICD-10-CM | POA: Diagnosis not present

## 2019-09-22 DIAGNOSIS — K922 Gastrointestinal hemorrhage, unspecified: Secondary | ICD-10-CM | POA: Diagnosis not present

## 2019-09-22 DIAGNOSIS — Z96652 Presence of left artificial knee joint: Secondary | ICD-10-CM | POA: Diagnosis present

## 2019-09-22 DIAGNOSIS — E785 Hyperlipidemia, unspecified: Secondary | ICD-10-CM | POA: Diagnosis present

## 2019-09-22 DIAGNOSIS — R42 Dizziness and giddiness: Secondary | ICD-10-CM | POA: Diagnosis present

## 2019-09-22 HISTORY — PX: ESOPHAGOGASTRODUODENOSCOPY (EGD) WITH PROPOFOL: SHX5813

## 2019-09-22 LAB — SARS CORONAVIRUS 2 (TAT 6-24 HRS): SARS Coronavirus 2: NEGATIVE

## 2019-09-22 LAB — HEMOGLOBIN AND HEMATOCRIT, BLOOD
HCT: 29.4 % — ABNORMAL LOW (ref 36.0–46.0)
Hemoglobin: 9.8 g/dL — ABNORMAL LOW (ref 12.0–15.0)

## 2019-09-22 SURGERY — ESOPHAGOGASTRODUODENOSCOPY (EGD) WITH PROPOFOL
Anesthesia: Monitor Anesthesia Care

## 2019-09-22 MED ORDER — MIDAZOLAM HCL (PF) 5 MG/ML IJ SOLN
INTRAMUSCULAR | Status: AC
  Filled 2019-09-22: qty 2

## 2019-09-22 MED ORDER — SODIUM CHLORIDE 0.9 % IV SOLN: INTRAVENOUS | Status: DC

## 2019-09-22 MED ORDER — BUTAMBEN-TETRACAINE-BENZOCAINE 2-2-14 % EX AERO
INHALATION_SPRAY | CUTANEOUS | Status: DC | PRN
  Administered 2019-09-22: 2 via TOPICAL

## 2019-09-22 MED ORDER — MIDAZOLAM HCL (PF) 5 MG/ML IJ SOLN
INTRAMUSCULAR | Status: DC | PRN
  Administered 2019-09-22 (×2): 2 mg via INTRAVENOUS

## 2019-09-22 MED ORDER — FENTANYL CITRATE (PF) 100 MCG/2ML IJ SOLN
INTRAMUSCULAR | Status: AC
  Filled 2019-09-22: qty 2

## 2019-09-22 MED ORDER — FENTANYL CITRATE (PF) 100 MCG/2ML IJ SOLN
INTRAMUSCULAR | Status: DC | PRN
  Administered 2019-09-22 (×2): 25 ug via INTRAVENOUS

## 2019-09-22 MED ORDER — LACTATED RINGERS IV SOLN
INTRAVENOUS | Status: DC
  Administered 2019-09-22: 11:00:00 via INTRAVENOUS

## 2019-09-22 SURGICAL SUPPLY — 15 items

## 2019-09-22 NOTE — Interval H&P Note (Signed)
History and Physical Interval Note:  09/22/2019 10:29 AM  [name]  has presented today for surgery, with the diagnosis of gi bleeding.  The various methods of treatment have been discussed with the patient and family. After consideration of risks, benefits and other options for treatment, the patient has consented to  Procedure(s): ESOPHAGOGASTRODUODENOSCOPY (EGD) WITH PROPOFOL (N/A) as a surgical intervention.  The patient's history has been reviewed, patient examined, no change in status, stable for surgery.  I have reviewed the patient's chart and labs.  Questions were answered to the patient's satisfaction.     Milus Banister

## 2019-09-22 NOTE — Consult Note (Addendum)
[facility] Referring Provider: ER MD Primary Care Physician:  Shon Baton, MD Primary Gastroenterologist:  Dr. Ardis Hughs  Reason for Consultation: [name] bleeding   HPI:  [name] is a 78 y.o. female whom I know from a colonsocoyp 2015 for bowel changes that was essentially normal. She has metastatic ovarian cancer, has been doing quite poorly and is actually enrolled in home hospice.  Last imaging was 04/2019 and this showed intraabdominal spread that was increasing in size.  Of note, there is a tumor involving her gastric wall, a tumor involving her cecum and also a cystic lesion in her pelvis adjacent to her rectum. She is on eliquis for intermittent Afib.   [name] she started having bright red rectal bleeding with near syncope. Two occasions at home and then again in ER and once in the middle of the night after she was admitted.  Her last eliquis was probably yesterday morning. She does not take NSAIDs.  She has received 2 unit blood transfusion so far with very nice bump in Hb 7.5 to 9.8. She was put on IV PPI bolus and then drip.  She's had no vomiting, no nausea.  She did have a fever a few days ago (up to 102 at home) associated with RLQ abdominal pains. The fever and pains subsided after 1-2 days however she is still a bit uncomfortable in the RLQ on exam today.   Past Medical History:  Diagnosis Date  . Arthritis    OSTEOARTHRITIS   -- CONSTANT PAIN RIGHT HIP---AND PAIN LEFT KNEE--PT STATES SHE GETS INJECTIONS INTO HER KNEE  . Complication of anesthesia    BLOOD PRESSURE DROPPED WITH NASAL SURGERY, ONE OF THE CARPAL TUNNEL REPAIRS AND DURING A COLONOSCOPY  . Dyslipidemia   . History of skin cancer   . Hypertension   . Hypothyroidism   . Menopausal symptoms   . NSTEMI (non-ST elevated myocardial infarction) ([place]) 12/09/15   Medical management: Distal branch of D1 95%, ostial D2 75%, distal LAD 50%. Tortuous arteries consistent with hypertension  . Ovarian cancer  ([place]) 01/2013   Recurrence since 2014/2016  . PAF (paroxysmal atrial fibrillation) ([place])    On ELIQUIS; On Amiodarone (Eye Exam 12/25/15) - no longer on Rythmol  . Stroke [facility]) 2017   no deficits    Past Surgical History:  Procedure Laterality Date  . BILATERAL CARPAL TUNNEL REPAIR  2007  . BREAST BIOPSY Left 03/19/2014   benign  . CARDIAC CATHETERIZATION N/A 12/05/2015   Procedure: Left Heart Cath and Coronary Angiography;  Surgeon: Belva Crome, MD;  Location: [facility];  Service: Cardiovascular;  distal branch of D1 95%, ostial D2 75%, dLAD 50%,p-mRCA 40%. Tortuous vessels.  Marland Kitchen DILATION AND CURETTAGE OF UTERUS  1969  . JOINT REPLACEMENT    . LAPAROTOMY Bilateral 02/13/2013   Procedure: EXPLORATORY LAPAROTOMY TOTAL ABDOMINAL HYSTERECTOMY BILATERAL SALPINGO-OOPHORECTOMY, Partial Rectal Resection with Reanastamosis;  Surgeon: Alvino Chapel, MD;  Location: [facility];  Service: Gynecology;  Laterality: Bilateral;  . LEFT KNEE ARTHROSCOPY   2011  . LYMPHADENECTOMY Right 02/13/2013   Procedure: PEVLIC  LYMPHADENECTOMY, DEBULKING right pelvic tumor nodules;  Surgeon: Alvino Chapel, MD;  Location: [facility];  Service: Gynecology;  Laterality: Right;  . NM MYOVIEW LTD  June 2010    subbmaximal with no ischemia or infarction.  . OMENTECTOMY  02/13/2013   Procedure: OMENTECTOMY;  Surgeon: Alvino Chapel, MD;  Location: [facility];  Service: Gynecology;;  . RHINOPLASTY FOR FRACTURED NOSE  [place]  . SYNOVECTOMY WITH POLY EXCHANGE Left 03/18/2017   Procedure: LEFT KNEE PARTIAL SYNOVECTOMY WITH POLY EXCHANGE;  Surgeon: Mcarthur Rossetti, MD;  Location: [facility];  Service: Orthopedics;  Laterality: Left;  Adductor Block  . TAH/BSO/Tumor debulking with right pelvic LND  01/2013  . TONSILLECTOMY  1962  . TOTAL HIP ARTHROPLASTY  02/25/2012   Procedure: TOTAL HIP ARTHROPLASTY ANTERIOR APPROACH;  Surgeon: Mcarthur Rossetti, MD;   Location: [facility];  Service: Orthopedics;  Laterality: Right;  . TOTAL KNEE ARTHROPLASTY Left 01/03/2015   Procedure: LEFT TOTAL KNEE ARTHROPLASTY;  Surgeon: Mcarthur Rossetti, MD;  Location: [facility];  Service: Orthopedics;  Laterality: Left;  . TRANSTHORACIC ECHOCARDIOGRAM  May 2014; January 2016   a. Normal LV size function. EF 60-65%. Grade 1 diastolic function. Mild MR and mildly elevated PA pressures of 37 mmHg per;; b. EF 60-65%. Mobile echodensity 10 mm x 6 mm attest interventricular septum is questionable fibroblastoma.  Marland Kitchen TRIGGER FINGER RELEASE Left 12/20/2017   Procedure: RELEASE TRIGGER FINGER/A-1 PULLEY LEFT RING FINGER;  Surgeon: Daryll Brod, MD;  Location: [facility];  Service: Orthopedics;  Laterality: Left;    Prior to Admission medications   Medication Sig Start Date End Date Taking? Authorizing Provider  acetaminophen (TYLENOL) 500 MG tablet Take 1,000 mg by mouth every 6 (six) hours as needed for mild pain.    Yes [provider]  amiodarone (PACERONE) 200 MG tablet Take 1 tablet (200 mg total) by mouth daily. Patient taking differently: Take 100 mg by mouth daily.  09/14/19  Yes Leonie Man, MD  Biotin 10 MG TABS Take 20 mg by mouth daily.   Yes [provider]  co-enzyme Q-10 30 MG capsule Take 100 mg by mouth daily.    Yes [provider]  desonide (DESOWEN) 0.05 % cream Apply 1 application topically 2 (two) times daily.  12/23/14  Yes [provider]  diphenhydrAMINE (BENADRYL) 25 MG tablet Take 50 mg by mouth at bedtime as needed for sleep.    Yes [provider]  ELIQUIS 5 MG TABS tablet TAKE 1 TABLET BY MOUTH TWICE A DAY Patient taking differently: Take 5 mg by mouth 2 (two) times daily.  04/26/19  Yes Leonie Man, MD  ezetimibe (ZETIA) 10 MG tablet Take 1 tablet (10 mg total) by mouth daily. Patient taking differently: Take 10 mg by mouth at bedtime.  02/24/16  Yes Leonie Man, MD  Glucosamine HCl  1000 MG TABS Take 1,000 mg by mouth daily.   Yes [provider]  hydrocortisone 1 % ointment Apply 1 application topically 2 (two) times daily as needed for itching (eczema).    Yes [provider]  ipratropium (ATROVENT) 0.06 % nasal spray Place 2 sprays into both nostrils 2 (two) times daily.  02/16/16  Yes [provider]  lidocaine-prilocaine (EMLA) cream APPLY TO PORTA-CATH SITE 1-2 HOURS PRIOR TO ACCESS AS DIRECTED. Patient taking differently: Apply 1 application topically as needed. Apply to [facility] site 1-2 hours prior to access as directed. 08/02/19  Yes Cross, Melissa D, NP  Melatonin 5 MG CAPS Take 5 mg by mouth at bedtime as needed (sleep). Reported on 12/25/2015   Yes [provider]  Menthol, Topical Analgesic, (ICY HOT EX) Apply 1 application topically 2 (two) times daily as needed (PAIN).    Yes [provider]  Multiple Vitamin (MULTIVITAMIN WITH MINERALS) TABS tablet Take  1 tablet by mouth daily. Centrum Silver   Yes [provider]  Polyethyl Glycol-Propyl Glycol (SYSTANE OP) Place 1 drop into both eyes 2 (two) times daily.    Yes [provider]  polyethylene glycol (MIRALAX / GLYCOLAX) packet Take 17 g by mouth daily. Mix in 8 oz liquid and drink   Yes [provider]  potassium chloride (K-DUR) 10 MEQ tablet Take 10 mEq by mouth daily. 03/05/19  Yes [provider]  prochlorperazine (COMPAZINE) 10 MG tablet TAKE 1 TABLET (10 MG TOTAL) BY MOUTH EVERY 6 (SIX) HOURS AS NEEDED (NAUSEA OR VOMITING). Patient taking differently: Take 10 mg by mouth daily as needed (Nausea or vomiting).  03/05/19  Yes Gorsuch, Ni, MD  SENEXON-S 8.6-50 MG tablet Take 1 tablet by mouth daily as needed for mild constipation.  08/16/19  Yes [provider]  SYNTHROID 88 MCG tablet Take 88 mcg by mouth daily. 05/06/19  Yes [provider]  traMADol (ULTRAM) 50 MG tablet Take 50 mg by mouth every 6 (six) hours as  needed. for pain 08/11/19  Yes [provider]  ondansetron (ZOFRAN) 8 MG tablet TAKE 1 TABLET (8 MG TOTAL) BY MOUTH EVERY 8 (EIGHT) HOURS AS NEEDED FOR NAUSEA OR VOMITING. Patient taking differently: Take 8 mg by mouth daily as needed for nausea or vomiting.  12/11/18   Heath Lark, MD    Current Facility-Administered Medications  Medication Dose Route Frequency Provider Last Rate Last Dose  . acetaminophen (TYLENOL) tablet 650 mg  650 mg Oral Q6H PRN Opyd, Ilene Qua, MD       Or  . acetaminophen (TYLENOL) suppository 650 mg  650 mg Rectal Q6H PRN Opyd, Ilene Qua, MD      . amiodarone (PACERONE) tablet 200 mg  200 mg Oral Daily Opyd, Ilene Qua, MD      . Chlorhexidine Gluconate Cloth 2 % PADS 6 each  6 each Topical Daily Milus Banister, MD      . levothyroxine (SYNTHROID) tablet 88 mcg  88 mcg Oral Q0600 Vianne Bulls, MD   88 mcg at 09/22/19 0440  . ondansetron (ZOFRAN) tablet 4 mg  4 mg Oral Q6H PRN Opyd, Ilene Qua, MD       Or  . ondansetron (ZOFRAN) injection 4 mg  4 mg Intravenous Q6H PRN Opyd, Ilene Qua, MD      . pantoprazole (PROTONIX) injection 40 mg  40 mg Intravenous Q12H Opyd, Ilene Qua, MD      . sodium chloride flush (NS) 0.9 % injection 10-40 mL  10-40 mL Intracatheter PRN Milus Banister, MD      . sodium chloride flush (NS) 0.9 % injection 3 mL  3 mL Intravenous Q12H Opyd, Ilene Qua, MD       Facility-Administered Medications Ordered in Other Encounters  Medication Dose Route Frequency Provider Last Rate Last Dose  . sodium chloride flush (NS) 0.9 % injection 10 mL  10 mL Intravenous PRN Joylene John D, NP   10 mL at 01/06/18 1220    Allergies as of 09/21/2019 - Review Complete 09/21/2019  Allergen Reaction Noted  . Clonidine derivatives Shortness Of Breath 11/07/2015  . Coreg [carvedilol] Shortness Of Breath 11/07/2015  . Augmentin [amoxicillin-pot clavulanate] Diarrhea 05/12/2019  . Caffeine Other (See Comments) 06/04/2016  . Codeine Other (See  Comments) 01/31/2013  . Flexeril [cyclobenzaprine] Other (See Comments) 04/21/2015  . Lipitor [atorvastatin] Dermatitis 12/04/2015  . Lisinopril  02/21/2012  . Pravastatin Other (See Comments)  02/24/2016  . Tape Rash 10/31/2014  . Tegaderm ag mesh [silver] Rash and Other (See Comments) 06/17/2015  . Ultram [tramadol] Palpitations 04/21/2015    Family History  Problem Relation Age of Onset  . Hypertension Mother   . Basal cell carcinoma Mother   . AAA (abdominal aortic aneurysm) Father   . Heart Problems Maternal Uncle   . Heart Problems Maternal Grandfather   . AAA (abdominal aortic aneurysm) Paternal Grandmother   . Prostate cancer Maternal Uncle 70  . Prostate cancer Other   . Other Daughter        one daughter had TAH-BSO at 49; other daughter will have one soon    Social History   Socioeconomic History  . Marital status: Married    Spouse name: Levada Dy  . Number of children: 3  . Years of education: Not on file  . Highest education level: Not on file  Occupational History  . Occupation: retired Animal nutritionist  . Financial resource strain: Not on file  . Food insecurity    Worry: Not on file    Inability: Not on file  . Transportation needs    Medical: Not on file    Non-medical: Not on file  Tobacco Use  . Smoking status: Never Smoker  . Smokeless tobacco: Never Used  Substance and Sexual Activity  . Alcohol use: No    Alcohol/week: 0.0 standard drinks  . Drug use: No  . Sexual activity: Not Currently  Lifestyle  . Physical activity    Days per week: Not on file    Minutes per session: Not on file  . Stress: Not on file  Relationships  . Social Herbalist on phone: Not on file    Gets together: Not on file    Attends religious service: Not on file    Active member of club or organization: Not on file    Attends meetings of clubs or organizations: Not on file    Relationship status: Not on file  . Intimate partner violence    Fear of  current or ex partner: Not on file    Emotionally abused: Not on file    Physically abused: Not on file    Forced sexual activity: Not on file  Other Topics Concern  . Not on file  Social History Narrative   Married mother of 3, grandmother 17.   Exercises 6/7 days a week walking 30 minutes a time.   Never smoked or drank alcohol.    Review of Systems: Pertinent positive and negative review of systems were noted in the above HPI section. Complete review of systems was performed and was otherwise normal.  Physical Exam: Vital signs in last 24 hours: Temp:  [98.4 F (36.9 C)-99 F (37.2 C)] 98.5 F (36.9 C) (10/24 0430) Pulse Rate:  [75-86] 76 (10/24 0430) Resp:  [15-19] 15 (10/24 0430) BP: (124-142)/(57-89) 133/76 (10/24 0430) SpO2:  [94 %-100 %] 94 % (10/24 0430) Weight:  [60.5 kg] 60.5 kg (10/23 2339) Last BM Date: 09/21/19 Constitutional: generally well-appearing Psychiatric: alert and oriented x3 Eyes: extraocular movements intact Mouth: oral pharynx moist, no lesions Neck: supple no lymphadenopathy Cardiovascular: heart regular rate and rhythm Lungs: clear to auscultation bilaterally Abdomen: soft, slightly tender RLQ, nondistended, no obvious ascites, no peritoneal signs, normal bowel sounds Extremities: no lower extremity edema bilaterally Skin: no lesions on visible extremities  Lab Results: Recent Labs    09/21/19 1850 09/22/19 0809  WBC 8.2  --  HGB 7.5* 9.8*  HCT 23.6* 29.4*  PLT 204  --   MCV 107.3*  --    BMET Recent Labs    09/21/19 1850  NA 136  K 4.3  CL 101  CO2 27  GLUCOSE 115*  BUN 20  CREATININE 0.74  CALCIUM 8.7*   LFT Recent Labs    09/21/19 1850  BILITOT 0.5  AST 18  ALT 15  ALKPHOS 22  PROT 5.7*  ALBUMIN 3.0*     Impression/Plan: 78 y.o. female with metastatic ovarian cancer, now with GI bleeding  In addition to the usual possible causes of GI bleeding I have definite concern about the three areas on her 04/2019 CT  scan that show tumor intimately associated with the GI tract; that is her stomach, cecum and rectum.  I explained that GI bleeding from cancer invading into the GI tract is very difficult to treat/stop endoscopically. Certainly I recommend she stay off blood thinners now and probably indefinitely.  I am planning EGD today. If no obvious source of her bleeding is noted in the UGI tract, I will probably order a CT angiogram which will give good information about the ovarian tumors adjacent to, involving the colon.  She may need a colonoscopy or flex sig pending that result.  Might need interventional radiology input as well.  I agree with IV PPI drip for now.  Milus Banister, MD  09/22/2019, 9:28 AM [facility] Pager [phone]

## 2019-09-22 NOTE — Progress Notes (Signed)
PROGRESS NOTE    [name]  [id number] DOB: [birth year] DOA: 09/21/2019 PCP: Shon Baton, MD   Brief Narrative:  Patient is a 78 year old female with history of metastatic ovarian cancer, atrial fibrillation on Eliquis, hypothyroidism who presented to the emergency department with rectal bleeding, fatigue, lightheadedness.  She had multiple episodes of bright red blood.  Per rectum.  She was found to be anemic in presentation and was transfused with units of PRBC.  Also started on Protonix.  She underwent evaluation with GI by endoscopy and found to have a bleeding friable mass in the stomach correlating with malignant ovarian cancer.  Assessment & Plan:   Principal Problem:   Acute GI bleeding Active Problems:   Hypothyroidism   Recurrent carcinoma of ovary (HCC)   Symptomatic anemia   GI bleed: Presented with 1 day history of rectal bleeding, lightheadedness, fatigue.  Anemic on presentation.  She had 2-3 bowel movements which were bloody this morning.  GI consulted.  Underwent EGD.  Found to have large friable, ulcerated mass involving the gastric fundus which could be the source of bleeding.  As per GI, there are no options for endoscopic therapy for this.  She also had other GI sites of invasion including cecum and rectum which could be also possible souce  of hematochezia. She was transfused with 2 units of PRBC.  Hemoglobin this morning in the range of 9.   Eliquis will be permanently stopped. I had extensive discussion with the patient and her husband about the finding and further plan.  Patient is not willing to pursue any further investigation for this bleeding including IR evaluation.  She has agreed to stay tonight so that we can check her CBC tomorrow morning and if it remains acceptable we will discharge her tomorrow AM.  Ovarian cancer: Malignant ovarian cancer with progression.  Status post hysterectomy.  She has known metastatic sites involving her GI tract  .Images on 6/20 showed progression of peritoneal metastasis.  Chemotherapy was stopped because of the progression of malignancy.  Last chemo in June.  Now on hospice.  Hypothyroidism: Continue Synthyroid  A. fib: Currently rate is controlled.  On Eliquis for anticoagulation which will be held permanently.  On amiodarone.         DVT prophylaxis: SCD Code Status: DnR Family Communication:  Disposition Plan: Back to home tomorrow morning if hemoglobin more than 7  Consultants: GI  Procedures: EGD  Antimicrobials:  Anti-infectives (From admission, onward)   None      Subjective: Patient seen and examined the bedside this morning.  Hemodynamically stable.  Had 3 bowel movements with red blood this morning.  Denies any abdomen pain, cramping, nausea or vomiting.  Objective: Vitals:   09/22/19 1146 09/22/19 1150 09/22/19 1200 09/22/19 1231  BP: 130/70 128/72 (!) 126/95 137/84  Pulse: 75 74 67 69  Resp: 16 14 20 20   Temp: 97.7 F (36.5 C)     TempSrc: Temporal     SpO2: 99% 96% 98% 100%  Weight:      Height:        Intake/Output Summary (Last 24 hours) at 09/22/2019 1346 Last data filed at 09/22/2019 0540 Gross per 24 hour  Intake 1297.71 ml  Output -  Net 1297.71 ml   Filed Weights   09/21/19 2339  Weight: 60.5 kg    Examination:  General exam: Not in distress, chronically ill looking, debilitated, deconditioned HEENT:PERRL,Oral mucosa moist, Ear/Nose normal on gross exam Respiratory system: Bilateral equal air  entry, normal vesicular breath sounds, no wheezes or crackles  Cardiovascular system: S1 & S2 heard, RRR. No JVD, murmurs, rubs, gallops or clicks. No pedal edema.  Chemo-Port on the right chest Gastrointestinal system: Abdomen is nondistended, soft and nontender. No organomegaly or masses felt. Normal bowel sounds heard. Central nervous system: Alert and oriented. No focal neurological deficits. Extremities: No edema, no clubbing ,no cyanosis, distal  peripheral pulses palpable. Skin: No rashes, lesions or ulcers,no icterus ,no pallor    Data Reviewed: I have personally reviewed following labs and imaging studies  CBC: Recent Labs  Lab 09/21/19 1850 09/22/19 0809  WBC 8.2  --   NEUTROABS 6.6  --   HGB 7.5* 9.8*  HCT 23.6* 29.4*  MCV 107.3*  --   PLT 204  --    Basic Metabolic Panel: Recent Labs  Lab 09/21/19 1850  NA 136  K 4.3  CL 101  CO2 27  GLUCOSE 115*  BUN 20  CREATININE 0.74  CALCIUM 8.7*   GFR: Estimated Creatinine Clearance: 49.5 mL/min (by C-G formula based on SCr of 0.74 mg/dL). Liver Function Tests: Recent Labs  Lab 09/21/19 1850  AST 18  ALT 15  ALKPHOS 56  BILITOT 0.5  PROT 5.7*  ALBUMIN 3.0*   No results for input(s): LIPASE, AMYLASE in the last 168 hours. No results for input(s): AMMONIA in the last 168 hours. Coagulation Profile: No results for input(s): INR, PROTIME in the last 168 hours. Cardiac Enzymes: No results for input(s): CKTOTAL, CKMB, CKMBINDEX, TROPONINI in the last 168 hours. BNP (last 3 results) No results for input(s): PROBNP in the last 8760 hours. HbA1C: No results for input(s): HGBA1C in the last 72 hours. CBG: No results for input(s): GLUCAP in the last 168 hours. Lipid Profile: No results for input(s): CHOL, HDL, LDLCALC, TRIG, CHOLHDL, LDLDIRECT in the last 72 hours. Thyroid Function Tests: No results for input(s): TSH, T4TOTAL, FREET4, T3FREE, THYROIDAB in the last 72 hours. Anemia Panel: No results for input(s): VITAMINB12, FOLATE, FERRITIN, TIBC, IRON, RETICCTPCT in the last 72 hours. Sepsis Labs: No results for input(s): PROCALCITON, LATICACIDVEN in the last 168 hours.  Recent Results (from the past 240 hour(s))  Urine Culture     Status: Abnormal   Collection Time: 09/13/19 11:15 AM   Specimen: Urine, Random  Result Value Ref Range Status   Specimen Description   Final    URINE, RANDOM Performed at [facility] [address], [place], [place] [address]    Special Requests   Final    NONE Performed at [facility], [place] [address] [place], [place] [address]    Culture MULTIPLE SPECIES PRESENT, SUGGEST RECOLLECTION (A)  Final   Report Status 09/14/2019 FINAL  Final  SARS CORONAVIRUS 2 (TAT 6-24 HRS) Nasopharyngeal Nasopharyngeal Swab     Status: None   Collection Time: 09/21/19  7:47 PM   Specimen: Nasopharyngeal Swab  Result Value Ref Range Status   SARS Coronavirus 2 NEGATIVE NEGATIVE Final    Comment: (NOTE) SARS-CoV-2 target nucleic acids are NOT DETECTED. The SARS-CoV-2 RNA is generally detectable in upper and lower respiratory specimens during the acute phase of infection. Negative results do not preclude SARS-CoV-2 infection, do not rule out co-infections with other pathogens, and should not be used as the sole basis for treatment or other patient management decisions. Negative results must be combined with clinical observations, patient history, and epidemiological information. The expected result is Negative. Fact Sheet for Patients: SugarRoll.be  Fact Sheet for Healthcare Providers: https://www.woods-mathews.com/ This test is not yet approved or cleared by the [place] FDA and  has been authorized for detection and/or diagnosis of SARS-CoV-2 by FDA under an Emergency Use Authorization (EUA). This EUA will remain  in effect (meaning this test can be used) for the duration of the COVID-19 declaration under Section 56 4(b)(1) of the Act, 21 U.S.C. section 360bbb-3(b)(1), unless the authorization is terminated or revoked sooner. Performed at [facility], [place] [address] [place], [place] [address]          Radiology Studies: No results found.      Scheduled Meds: . amiodarone  200 mg Oral Daily  . Chlorhexidine Gluconate Cloth  6 each Topical Daily  . levothyroxine  88 mcg Oral Q0600  . pantoprazole (PROTONIX)  IV  40 mg Intravenous Q12H  . sodium chloride flush  3 mL Intravenous Q12H   Continuous Infusions:   LOS: 0 days    Time spent: 35 mins.More than 50% of that time was spent in counseling and/or coordination of care.      Shelly Coss, MD [facility] Pager [phone]  If 7PM-7AM, please contact night-coverage [email or web address] Password [email or web address] 09/22/2019, 1:46 PM

## 2019-09-22 NOTE — Progress Notes (Signed)
[facility]) Hospital Liaison Note   This is a related hospital admission of 09/21/2019 with a Hospice Diagnosis of Ovarian CA with mets. Sent to ED for bright red blood in stool, dizziness and weakness.  [name] she started having bright red rectal bleeding with near syncope. Two occasions at home and then again in ER and once in the middle of the night after she was admitted.  Her last eliquis was probably yesterday morning. She does not take NSAIDs.  She has received 2 unit blood transfusion so far with very nice bump in Hb 7.5 to 9.8. She was put on IV PPI bolus and then drip.  Temp:  [98.4 F (36.9 C)-99 F (37.2 C)] 98.5 F (36.9 C) (10/24 0430) Pulse Rate:  [75-86] 76 (10/24 0430) Resp:  [15-19] 15 (10/24 0430) BP: (124-142)/(57-89) 133/76 (10/24 0430) SpO2:  [94 %-100 %] 94 % (10/24 0430) Weight:  [60.5 kg] 60.5 kg (10/23 2339)  Intake and output not yet charted for today  Abnormal Lab work: Hgb 9.8, albumin 3.0 IVs/PRNs- receiving IV fluids and blood products  Problem List:  Principal Problem: GI bleed- hold Eliquis and give PPI Active Problems: Ovarian cancer- comfort care with ACC d/t mets PAF (paroxysmal atrial fibrillation) On ELIQUIS; currently being held     D/C planning:  Hope is to dc home. Writer called husband and advised to call [facility] for any support needed. Transfer summary sent to unit.   Goals of Care:  Unclear as of 09/22/2019. Upper endoscopy performed and blood products given. Pt still DNR but it is unclear as to how far pt would like to go with treating the bleed.  IDT:  updated Family: left VM for follow up  Freddie Breech, [facility] Nurse Liaison [phone]

## 2019-09-22 NOTE — Progress Notes (Signed)
I spoke with her and her husband at length about her situation.  They both understand that she is bleeding because of the ovarian cancer and that there are no options to treat this type of bleeding endoscopically that would offer anything more than a very temporary solution (if that).  Interventional radiology could certainly be contacted. I think they will probably have similar things to say about any options they could offer. Certainly, she should never resume blood thinners.   I am signing off for now but I am happy to discuss further if needed.

## 2019-09-22 NOTE — H&P (View-Only) (Signed)
[facility] Referring Provider: ER MD Primary Care Physician:  Shon Baton, MD Primary Gastroenterologist:  Dr. Ardis Hughs  Reason for Consultation: [name] bleeding   HPI:  [name] is a 78 y.o. female whom I know from a colonsocoyp 2015 for bowel changes that was essentially normal. She has metastatic ovarian cancer, has been doing quite poorly and is actually enrolled in home hospice.  Last imaging was 04/2019 and this showed intraabdominal spread that was increasing in size.  Of note, there is a tumor involving her gastric wall, a tumor involving her cecum and also a cystic lesion in her pelvis adjacent to her rectum. She is on eliquis for intermittent Afib.   [name] she started having bright red rectal bleeding with near syncope. Two occasions at home and then again in ER and once in the middle of the night after she was admitted.  Her last eliquis was probably yesterday morning. She does not take NSAIDs.  She has received 2 unit blood transfusion so far with very nice bump in Hb 7.5 to 9.8. She was put on IV PPI bolus and then drip.  She's had no vomiting, no nausea.  She did have a fever a few days ago (up to 102 at home) associated with RLQ abdominal pains. The fever and pains subsided after 1-2 days however she is still a bit uncomfortable in the RLQ on exam today.   Past Medical History:  Diagnosis Date  . Arthritis    OSTEOARTHRITIS   -- CONSTANT PAIN RIGHT HIP---AND PAIN LEFT KNEE--PT STATES SHE GETS INJECTIONS INTO HER KNEE  . Complication of anesthesia    BLOOD PRESSURE DROPPED WITH NASAL SURGERY, ONE OF THE CARPAL TUNNEL REPAIRS AND DURING A COLONOSCOPY  . Dyslipidemia   . History of skin cancer   . Hypertension   . Hypothyroidism   . Menopausal symptoms   . NSTEMI (non-ST elevated myocardial infarction) ([place]) 12/09/15   Medical management: Distal branch of D1 95%, ostial D2 75%, distal LAD 50%. Tortuous arteries consistent with hypertension  . Ovarian cancer  ([place]) 01/2013   Recurrence since 2014/2016  . PAF (paroxysmal atrial fibrillation) ([place])    On ELIQUIS; On Amiodarone (Eye Exam 12/25/15) - no longer on Rythmol  . Stroke [facility]) 2017   no deficits    Past Surgical History:  Procedure Laterality Date  . BILATERAL CARPAL TUNNEL REPAIR  2007  . BREAST BIOPSY Left 03/19/2014   benign  . CARDIAC CATHETERIZATION N/A 12/05/2015   Procedure: Left Heart Cath and Coronary Angiography;  Surgeon: Belva Crome, MD;  Location: [facility];  Service: Cardiovascular;  distal branch of D1 95%, ostial D2 75%, dLAD 50%,p-mRCA 40%. Tortuous vessels.  Marland Kitchen DILATION AND CURETTAGE OF UTERUS  1969  . JOINT REPLACEMENT    . LAPAROTOMY Bilateral 02/13/2013   Procedure: EXPLORATORY LAPAROTOMY TOTAL ABDOMINAL HYSTERECTOMY BILATERAL SALPINGO-OOPHORECTOMY, Partial Rectal Resection with Reanastamosis;  Surgeon: Alvino Chapel, MD;  Location: [facility];  Service: Gynecology;  Laterality: Bilateral;  . LEFT KNEE ARTHROSCOPY   2011  . LYMPHADENECTOMY Right 02/13/2013   Procedure: PEVLIC  LYMPHADENECTOMY, DEBULKING right pelvic tumor nodules;  Surgeon: Alvino Chapel, MD;  Location: [facility];  Service: Gynecology;  Laterality: Right;  . NM MYOVIEW LTD  June 2010    subbmaximal with no ischemia or infarction.  . OMENTECTOMY  02/13/2013   Procedure: OMENTECTOMY;  Surgeon: Alvino Chapel, MD;  Location: [facility];  Service: Gynecology;;  . RHINOPLASTY FOR FRACTURED NOSE  [place]  . SYNOVECTOMY WITH POLY EXCHANGE Left 03/18/2017   Procedure: LEFT KNEE PARTIAL SYNOVECTOMY WITH POLY EXCHANGE;  Surgeon: Mcarthur Rossetti, MD;  Location: [facility];  Service: Orthopedics;  Laterality: Left;  Adductor Block  . TAH/BSO/Tumor debulking with right pelvic LND  01/2013  . TONSILLECTOMY  1962  . TOTAL HIP ARTHROPLASTY  02/25/2012   Procedure: TOTAL HIP ARTHROPLASTY ANTERIOR APPROACH;  Surgeon: Mcarthur Rossetti, MD;   Location: [facility];  Service: Orthopedics;  Laterality: Right;  . TOTAL KNEE ARTHROPLASTY Left 01/03/2015   Procedure: LEFT TOTAL KNEE ARTHROPLASTY;  Surgeon: Mcarthur Rossetti, MD;  Location: [facility];  Service: Orthopedics;  Laterality: Left;  . TRANSTHORACIC ECHOCARDIOGRAM  May 2014; January 2016   a. Normal LV size function. EF 60-65%. Grade 1 diastolic function. Mild MR and mildly elevated PA pressures of 37 mmHg per;; b. EF 60-65%. Mobile echodensity 10 mm x 6 mm attest interventricular septum is questionable fibroblastoma.  Marland Kitchen TRIGGER FINGER RELEASE Left 12/20/2017   Procedure: RELEASE TRIGGER FINGER/A-1 PULLEY LEFT RING FINGER;  Surgeon: Daryll Brod, MD;  Location: [facility];  Service: Orthopedics;  Laterality: Left;    Prior to Admission medications   Medication Sig Start Date End Date Taking? Authorizing Provider  acetaminophen (TYLENOL) 500 MG tablet Take 1,000 mg by mouth every 6 (six) hours as needed for mild pain.    Yes [provider]  amiodarone (PACERONE) 200 MG tablet Take 1 tablet (200 mg total) by mouth daily. Patient taking differently: Take 100 mg by mouth daily.  09/14/19  Yes Leonie Man, MD  Biotin 10 MG TABS Take 20 mg by mouth daily.   Yes [provider]  co-enzyme Q-10 30 MG capsule Take 100 mg by mouth daily.    Yes [provider]  desonide (DESOWEN) 0.05 % cream Apply 1 application topically 2 (two) times daily.  12/23/14  Yes [provider]  diphenhydrAMINE (BENADRYL) 25 MG tablet Take 50 mg by mouth at bedtime as needed for sleep.    Yes [provider]  ELIQUIS 5 MG TABS tablet TAKE 1 TABLET BY MOUTH TWICE A DAY Patient taking differently: Take 5 mg by mouth 2 (two) times daily.  04/26/19  Yes Leonie Man, MD  ezetimibe (ZETIA) 10 MG tablet Take 1 tablet (10 mg total) by mouth daily. Patient taking differently: Take 10 mg by mouth at bedtime.  02/24/16  Yes Leonie Man, MD  Glucosamine HCl  1000 MG TABS Take 1,000 mg by mouth daily.   Yes [provider]  hydrocortisone 1 % ointment Apply 1 application topically 2 (two) times daily as needed for itching (eczema).    Yes [provider]  ipratropium (ATROVENT) 0.06 % nasal spray Place 2 sprays into both nostrils 2 (two) times daily.  02/16/16  Yes [provider]  lidocaine-prilocaine (EMLA) cream APPLY TO PORTA-CATH SITE 1-2 HOURS PRIOR TO ACCESS AS DIRECTED. Patient taking differently: Apply 1 application topically as needed. Apply to [facility] site 1-2 hours prior to access as directed. 08/02/19  Yes Cross, Melissa D, NP  Melatonin 5 MG CAPS Take 5 mg by mouth at bedtime as needed (sleep). Reported on 12/25/2015   Yes [provider]  Menthol, Topical Analgesic, (ICY HOT EX) Apply 1 application topically 2 (two) times daily as needed (PAIN).    Yes [provider]  Multiple Vitamin (MULTIVITAMIN WITH MINERALS) TABS tablet Take  1 tablet by mouth daily. Centrum Silver   Yes [provider]  Polyethyl Glycol-Propyl Glycol (SYSTANE OP) Place 1 drop into both eyes 2 (two) times daily.    Yes [provider]  polyethylene glycol (MIRALAX / GLYCOLAX) packet Take 17 g by mouth daily. Mix in 8 oz liquid and drink   Yes [provider]  potassium chloride (K-DUR) 10 MEQ tablet Take 10 mEq by mouth daily. 03/05/19  Yes [provider]  prochlorperazine (COMPAZINE) 10 MG tablet TAKE 1 TABLET (10 MG TOTAL) BY MOUTH EVERY 6 (SIX) HOURS AS NEEDED (NAUSEA OR VOMITING). Patient taking differently: Take 10 mg by mouth daily as needed (Nausea or vomiting).  03/05/19  Yes Gorsuch, Ni, MD  SENEXON-S 8.6-50 MG tablet Take 1 tablet by mouth daily as needed for mild constipation.  08/16/19  Yes [provider]  SYNTHROID 88 MCG tablet Take 88 mcg by mouth daily. 05/06/19  Yes [provider]  traMADol (ULTRAM) 50 MG tablet Take 50 mg by mouth every 6 (six) hours as  needed. for pain 08/11/19  Yes [provider]  ondansetron (ZOFRAN) 8 MG tablet TAKE 1 TABLET (8 MG TOTAL) BY MOUTH EVERY 8 (EIGHT) HOURS AS NEEDED FOR NAUSEA OR VOMITING. Patient taking differently: Take 8 mg by mouth daily as needed for nausea or vomiting.  12/11/18   Heath Lark, MD    Current Facility-Administered Medications  Medication Dose Route Frequency Provider Last Rate Last Dose  . acetaminophen (TYLENOL) tablet 650 mg  650 mg Oral Q6H PRN Opyd, Ilene Qua, MD       Or  . acetaminophen (TYLENOL) suppository 650 mg  650 mg Rectal Q6H PRN Opyd, Ilene Qua, MD      . amiodarone (PACERONE) tablet 200 mg  200 mg Oral Daily Opyd, Ilene Qua, MD      . Chlorhexidine Gluconate Cloth 2 % PADS 6 each  6 each Topical Daily Milus Banister, MD      . levothyroxine (SYNTHROID) tablet 88 mcg  88 mcg Oral Q0600 Vianne Bulls, MD   88 mcg at 09/22/19 0440  . ondansetron (ZOFRAN) tablet 4 mg  4 mg Oral Q6H PRN Opyd, Ilene Qua, MD       Or  . ondansetron (ZOFRAN) injection 4 mg  4 mg Intravenous Q6H PRN Opyd, Ilene Qua, MD      . pantoprazole (PROTONIX) injection 40 mg  40 mg Intravenous Q12H Opyd, Ilene Qua, MD      . sodium chloride flush (NS) 0.9 % injection 10-40 mL  10-40 mL Intracatheter PRN Milus Banister, MD      . sodium chloride flush (NS) 0.9 % injection 3 mL  3 mL Intravenous Q12H Opyd, Ilene Qua, MD       Facility-Administered Medications Ordered in Other Encounters  Medication Dose Route Frequency Provider Last Rate Last Dose  . sodium chloride flush (NS) 0.9 % injection 10 mL  10 mL Intravenous PRN Joylene John D, NP   10 mL at 01/06/18 1220    Allergies as of 09/21/2019 - Review Complete 09/21/2019  Allergen Reaction Noted  . Clonidine derivatives Shortness Of Breath 11/07/2015  . Coreg [carvedilol] Shortness Of Breath 11/07/2015  . Augmentin [amoxicillin-pot clavulanate] Diarrhea 05/12/2019  . Caffeine Other (See Comments) 06/04/2016  . Codeine Other (See  Comments) 01/31/2013  . Flexeril [cyclobenzaprine] Other (See Comments) 04/21/2015  . Lipitor [atorvastatin] Dermatitis 12/04/2015  . Lisinopril  02/21/2012  . Pravastatin Other (See Comments)  02/24/2016  . Tape Rash 10/31/2014  . Tegaderm ag mesh [silver] Rash and Other (See Comments) 06/17/2015  . Ultram [tramadol] Palpitations 04/21/2015    Family History  Problem Relation Age of Onset  . Hypertension Mother   . Basal cell carcinoma Mother   . AAA (abdominal aortic aneurysm) Father   . Heart Problems Maternal Uncle   . Heart Problems Maternal Grandfather   . AAA (abdominal aortic aneurysm) Paternal Grandmother   . Prostate cancer Maternal Uncle 70  . Prostate cancer Other   . Other Daughter        one daughter had TAH-BSO at 54; other daughter will have one soon    Social History   Socioeconomic History  . Marital status: Married    Spouse name: Levada Dy  . Number of children: 3  . Years of education: Not on file  . Highest education level: Not on file  Occupational History  . Occupation: retired Animal nutritionist  . Financial resource strain: Not on file  . Food insecurity    Worry: Not on file    Inability: Not on file  . Transportation needs    Medical: Not on file    Non-medical: Not on file  Tobacco Use  . Smoking status: Never Smoker  . Smokeless tobacco: Never Used  Substance and Sexual Activity  . Alcohol use: No    Alcohol/week: 0.0 standard drinks  . Drug use: No  . Sexual activity: Not Currently  Lifestyle  . Physical activity    Days per week: Not on file    Minutes per session: Not on file  . Stress: Not on file  Relationships  . Social Herbalist on phone: Not on file    Gets together: Not on file    Attends religious service: Not on file    Active member of club or organization: Not on file    Attends meetings of clubs or organizations: Not on file    Relationship status: Not on file  . Intimate partner violence    Fear of  current or ex partner: Not on file    Emotionally abused: Not on file    Physically abused: Not on file    Forced sexual activity: Not on file  Other Topics Concern  . Not on file  Social History Narrative   Married mother of 3, grandmother 25.   Exercises 6/7 days a week walking 30 minutes a time.   Never smoked or drank alcohol.    Review of Systems: Pertinent positive and negative review of systems were noted in the above HPI section. Complete review of systems was performed and was otherwise normal.  Physical Exam: Vital signs in last 24 hours: Temp:  [98.4 F (36.9 C)-99 F (37.2 C)] 98.5 F (36.9 C) (10/24 0430) Pulse Rate:  [75-86] 76 (10/24 0430) Resp:  [15-19] 15 (10/24 0430) BP: (124-142)/(57-89) 133/76 (10/24 0430) SpO2:  [94 %-100 %] 94 % (10/24 0430) Weight:  [60.5 kg] 60.5 kg (10/23 2339) Last BM Date: 09/21/19 Constitutional: generally well-appearing Psychiatric: alert and oriented x3 Eyes: extraocular movements intact Mouth: oral pharynx moist, no lesions Neck: supple no lymphadenopathy Cardiovascular: heart regular rate and rhythm Lungs: clear to auscultation bilaterally Abdomen: soft, slightly tender RLQ, nondistended, no obvious ascites, no peritoneal signs, normal bowel sounds Extremities: no lower extremity edema bilaterally Skin: no lesions on visible extremities  Lab Results: Recent Labs    09/21/19 1850 09/22/19 0809  WBC 8.2  --  HGB 7.5* 9.8*  HCT 23.6* 29.4*  PLT 204  --   MCV 107.3*  --    BMET Recent Labs    09/21/19 1850  NA 136  K 4.3  CL 101  CO2 27  GLUCOSE 115*  BUN 20  CREATININE 0.74  CALCIUM 8.7*   LFT Recent Labs    09/21/19 1850  BILITOT 0.5  AST 18  ALT 15  ALKPHOS 29  PROT 5.7*  ALBUMIN 3.0*     Impression/Plan: 78 y.o. female with metastatic ovarian cancer, now with GI bleeding  In addition to the usual possible causes of GI bleeding I have definite concern about the three areas on her 04/2019 CT  scan that show tumor intimately associated with the GI tract; that is her stomach, cecum and rectum.  I explained that GI bleeding from cancer invading into the GI tract is very difficult to treat/stop endoscopically. Certainly I recommend she stay off blood thinners now and probably indefinitely.  I am planning EGD today. If no obvious source of her bleeding is noted in the UGI tract, I will probably order a CT angiogram which will give good information about the ovarian tumors adjacent to, involving the colon.  She may need a colonoscopy or flex sig pending that result.  Might need interventional radiology input as well.  I agree with IV PPI drip for now.  Milus Banister, MD  09/22/2019, 9:28 AM [facility] Pager [phone]

## 2019-09-22 NOTE — Progress Notes (Signed)
Pt has arrived back to Room 1511 alert and oriented and from Endo. Husband at bedside.

## 2019-09-22 NOTE — Op Note (Addendum)
[facility] Patient Name: [name] Procedure Date: 09/22/2019 MRN: [id number] Attending MD: Milus Banister , MD Date of Birth: [birth year] CSN: [id number] Age: 78 Admit Type: Inpatient Procedure:                Upper GI endoscopy Indications:              Hematochezia, in hospcie for metastatic ovarian                            cancer with CT 04/2019 showing tumors abutting                            stomach, cecum and rectum (and elsewhere) Providers:                Milus Banister, MD, Ashley Jacobs, RN, Laverda Sorenson, Technician Referring MD:              Medicines:                Fentanyl 50 micrograms IV, Midazolam 4 mg IV Complications:            No immediate complications. Estimated blood loss:                            None. Estimated Blood Loss:     Estimated blood loss: none. Procedure:                Pre-Anesthesia Assessment:                           - Prior to the procedure, a History and Physical                            was performed, and patient medications and                            allergies were reviewed. The patient's tolerance of                            previous anesthesia was also reviewed. The risks                            and benefits of the procedure and the sedation                            options and risks were discussed with the patient.                            All questions were answered, and informed consent                            was obtained. Prior Anticoagulants: The patient has  taken Eliquis (apixaban), last dose was 1 day prior                            to procedure. ASA Grade Assessment: IV - A patient                            with severe systemic disease that is a constant                            threat to life. After reviewing the risks and                            benefits, the patient was deemed in satisfactory      condition to undergo the procedure.                           After obtaining informed consent, the endoscope was                            passed under direct vision. Throughout the                            procedure, the patient's blood pressure, pulse, and                            oxygen saturations were monitored continuously. The                            [id number] Olympus endoscope was introduced                            through the mouth, and advanced to the second part                            of duodenum. The upper GI endoscopy was                            accomplished without difficulty. The patient                            tolerated the procedure well. Scope In: Scope Out: Findings:      There was no blood in the UGI tract at the beginning of this exam.      There was a large, ulcerated, friable mass involving the fundus,       proximal stomach. The mass oozed easily with light touch from the       gastroscope.      The exam was otherwise without abnormality. Impression:               - Large friable, ulcerated mass involving the                            gastric fundus. This correlates to her one of her  known ovarian cancer sites involving the GI tract                            (last imaged 04/2019). There are no good options for                            endoscopic therapy of this site. Certainly she                            should never resume blood thinners. Last eliquis                            was yesterday AM.                           - Her overt hematochezia may be from this gastric                            site of invasion however the CT scan 04/2019 also                            showed tumors involving the cecum and rectum and                            those other sites may be causing bleeding as well. Moderate Sedation:      Moderate (conscious) sedation was administered by the endoscopy nurse        and supervised by the endoscopist. The patient's oxygen saturation,       heart rate, blood pressure and response to care were monitored. Total       physician intraservice time was 15 minutes. Recommendation:           - Liquid diet only.                           - Need to establish her goals of care.                           - Can consider interventional radiology opinion                            depending on her wishes, this would at least                            involve CT angiography. Prior to this procedure she                            was not sure if she would want aggressive measures                            if the bleeding was indeed from her ovarian cancer. Procedure Code(s):        --- Professional ---  [id number], Esophagogastroduodenoscopy, flexible,                            transoral; diagnostic, including collection of                            specimen(s) by brushing or washing, when performed                            (separate procedure) Diagnosis Code(s):        --- Professional ---                           D49.0, Neoplasm of unspecified behavior of                            digestive system                           K92.1, Melena (includes Hematochezia) CPT copyright 2019 [employer]. All rights reserved. The codes documented in this report are preliminary and upon coder review may  be revised to meet current compliance requirements. Milus Banister, MD 09/22/2019 11:54:14 AM This report has been signed electronically. Number of Addenda: 0

## 2019-09-23 DIAGNOSIS — K922 Gastrointestinal hemorrhage, unspecified: Secondary | ICD-10-CM

## 2019-09-23 DIAGNOSIS — D649 Anemia, unspecified: Secondary | ICD-10-CM

## 2019-09-23 DIAGNOSIS — C569 Malignant neoplasm of unspecified ovary: Secondary | ICD-10-CM

## 2019-09-23 DIAGNOSIS — E039 Hypothyroidism, unspecified: Secondary | ICD-10-CM

## 2019-09-23 LAB — CBC WITH DIFFERENTIAL/PLATELET
Abs Immature Granulocytes: 0.03 10*3/uL (ref 0.00–0.07)
Basophils Absolute: 0 10*3/uL (ref 0.0–0.1)
Basophils Relative: 0 %
Eosinophils Absolute: 0 10*3/uL (ref 0.0–0.5)
Eosinophils Relative: 1 %
HCT: 26.3 % — ABNORMAL LOW (ref 36.0–46.0)
Hemoglobin: 8.4 g/dL — ABNORMAL LOW (ref 12.0–15.0)
Immature Granulocytes: 1 %
Lymphocytes Relative: 28 %
Lymphs Abs: 1.6 10*3/uL (ref 0.7–4.0)
MCH: 30.9 pg (ref 26.0–34.0)
MCHC: 31.9 g/dL (ref 30.0–36.0)
MCV: 96.7 fL (ref 80.0–100.0)
Monocytes Absolute: 0.5 10*3/uL (ref 0.1–1.0)
Monocytes Relative: 9 %
Neutro Abs: 3.5 10*3/uL (ref 1.7–7.7)
Neutrophils Relative %: 61 %
Platelets: 169 10*3/uL (ref 150–400)
RBC: 2.72 MIL/uL — ABNORMAL LOW (ref 3.87–5.11)
RDW: 18.1 % — ABNORMAL HIGH (ref 11.5–15.5)
WBC: 5.6 10*3/uL (ref 4.0–10.5)
nRBC: 0 % (ref 0.0–0.2)

## 2019-09-23 LAB — TYPE AND SCREEN
ABO/RH(D): A POS
Antibody Screen: NEGATIVE
Unit division: 0
Unit division: 0

## 2019-09-23 LAB — BPAM RBC
Blood Product Expiration Date: 202011212359
Blood Product Expiration Date: 202011212359
ISSUE DATE / TIME: 202010232308
ISSUE DATE / TIME: 202010240207
Unit Type and Rh: 6200
Unit Type and Rh: 6200

## 2019-09-23 MED ORDER — HEPARIN SOD (PORK) LOCK FLUSH 100 UNIT/ML IV SOLN
500.0000 [IU] | INTRAVENOUS | Status: AC | PRN
  Administered 2019-09-23: 11:00:00 500 [IU]
  Filled 2019-09-23: qty 5

## 2019-09-23 NOTE — Discharge Summary (Signed)
Physician Discharge Summary  [name] [id number] DOB: [birth year] DOA: 09/21/2019  PCP: Shon Baton, MD  Admit date: 09/21/2019 Discharge date: 09/23/2019  Admitted From: Inpatient Disposition: home with hospice  Recommendations for Outpatient Follow-up:  1. Follow up with PCP in 1-2 weeks 2. Home hospice, already aware  Home Health:No Equipment/Devices:none  Discharge Condition:Fair CODE STATUS:DNR Diet recommendation: resume preadnmission diet  Brief/Interim Summary: Patient is a 78 year old female with history of metastatic ovarian cancer, atrial fibrillation on Eliquis, hypothyroidism who presented to the emergency department with rectal bleeding, fatigue, lightheadedness.  She had multiple episodes of bright red blood.  Per rectum.  She was found to be anemic in presentation and was transfused with units of PRBC.  Also started on Protonix.  She underwent evaluation with GI by endoscopy and found to have a bleeding friable mass in the stomach correlating with malignant ovarian cancer.  Hospital Course GI bleed: Presented with 1 day history of rectal bleeding, lightheadedness, fatigue.  Anemic on presentation.  She had 2-3 bowel movements which were bloody this morning.  GI consulted.  Underwent EGD.  Found to have large friable, ulcerated mass involving the gastric fundus which could be the source of bleeding.  As per GI, there are no options for endoscopic therapy for this.  She also had other GI sites of invasion including cecum and rectum which could be also possible souce  of hematochezia. She was transfused with 2 units of PRBC.  Hemoglobin this morning in the range of 9.   Eliquis will be permanently stopped. Previous MD had and extensive discussion with the patient and her husband about the finding and further plan.  Patient is not willing to pursue any further investigation for this bleeding including IR evaluation.  She  agreed to stay tonight so that we can check  her CBC tomorrow morning and if it remains acceptable we will discharge her tomorrow AM. HGB is acceptable, pt to be discharged home  Ovarian cancer: Malignant ovarian cancer with progression.  Status post hysterectomy.  She has known metastatic sites involving her GI tract .Images on 6/20 showed progression of peritoneal metastasis.  Chemotherapy was stopped because of the progression of malignancy.  Last chemo in June.  Now on hospice.  Hypothyroidism: Continue Synthyroid  A. fib: Currently rate is controlled.  On Eliquis for anticoagulation which will be held permanently.  On amiodarone.  Discharge Diagnoses:  Principal Problem:   Acute GI bleeding Active Problems:   Hypothyroidism   Recurrent carcinoma of ovary (HCC)   Symptomatic anemia    Discharge Instructions  Discharge Instructions    Call MD for:   Complete by: As directed    For any acute changes in medical condition   Diet - low sodium heart healthy   Complete by: As directed    Increase activity slowly   Complete by: As directed      Allergies as of 09/23/2019      Reactions   Clonidine Derivatives Shortness Of Breath   Coreg [carvedilol] Shortness Of Breath   Augmentin [amoxicillin-pot Clavulanate] Diarrhea   Caffeine Other (See Comments)   Makes heart race   Codeine Other (See Comments)   Does not like the feeling she gets   Doxycycline    "Explosive diarrhea"   Flexeril [cyclobenzaprine] Other (See Comments)   Pt states "increased heart rate"   Lipitor [atorvastatin] Dermatitis   "feels like bugs are biting her"    Lisinopril    LIP NUMBNESS   Pravastatin  Other (See Comments)   FEELS LIKE "BUG BITES" BITING HER LEGS    Tape Rash   TEGADERM.   (use opsite on PAC)   Tegaderm Ag Mesh [silver] Rash, Other (See Comments)   Burns skin   Ultram [tramadol] Palpitations      Medication List    STOP taking these medications   Eliquis 5 MG Tabs tablet Generic drug: apixaban     TAKE these  medications   acetaminophen 500 MG tablet Commonly known as: TYLENOL Take 1,000 mg by mouth every 6 (six) hours as needed for mild pain.   amiodarone 200 MG tablet Commonly known as: PACERONE Take 1 tablet (200 mg total) by mouth daily. What changed: how much to take   Biotin 10 MG Tabs Take 20 mg by mouth daily.   co-enzyme Q-10 30 MG capsule Take 100 mg by mouth daily.   desonide 0.05 % cream Commonly known as: DESOWEN Apply 1 application topically 2 (two) times daily.   diphenhydrAMINE 25 MG tablet Commonly known as: BENADRYL Take 50 mg by mouth at bedtime as needed for sleep.   ezetimibe 10 MG tablet Commonly known as: ZETIA Take 1 tablet (10 mg total) by mouth daily. What changed: when to take this   Glucosamine HCl 1000 MG Tabs Take 1,000 mg by mouth daily.   hydrocortisone 1 % ointment Apply 1 application topically 2 (two) times daily as needed for itching (eczema).   ICY HOT EX Apply 1 application topically 2 (two) times daily as needed (PAIN).   ipratropium 0.06 % nasal spray Commonly known as: ATROVENT Place 2 sprays into both nostrils 2 (two) times daily.   lidocaine-prilocaine cream Commonly known as: EMLA APPLY TO PORTA-CATH SITE 1-2 HOURS PRIOR TO ACCESS AS DIRECTED. What changed: See the new instructions.   Melatonin 5 MG Caps Take 5 mg by mouth at bedtime as needed (sleep). Reported on 12/25/2015   multivitamin with minerals Tabs tablet Take 1 tablet by mouth daily. Centrum Silver   ondansetron 8 MG tablet Commonly known as: ZOFRAN TAKE 1 TABLET (8 MG TOTAL) BY MOUTH EVERY 8 (EIGHT) HOURS AS NEEDED FOR NAUSEA OR VOMITING. What changed: when to take this   polyethylene glycol 17 g packet Commonly known as: MIRALAX / GLYCOLAX Take 17 g by mouth daily. Mix in 8 oz liquid and drink   potassium chloride 10 MEQ tablet Commonly known as: KLOR-CON Take 10 mEq by mouth daily.   prochlorperazine 10 MG tablet Commonly known as: COMPAZINE TAKE 1  TABLET (10 MG TOTAL) BY MOUTH EVERY 6 (SIX) HOURS AS NEEDED (NAUSEA OR VOMITING). What changed: when to take this   Senexon-S 8.6-50 MG tablet Generic drug: senna-docusate Take 1 tablet by mouth daily as needed for mild constipation.   Synthroid 88 MCG tablet Generic drug: levothyroxine Take 88 mcg by mouth daily.   SYSTANE OP Place 1 drop into both eyes 2 (two) times daily.   traMADol 50 MG tablet Commonly known as: ULTRAM Take 50 mg by mouth every 6 (six) hours as needed. for pain       Allergies  Allergen Reactions  . Clonidine Derivatives Shortness Of Breath  . Coreg [Carvedilol] Shortness Of Breath  . Augmentin [Amoxicillin-Pot Clavulanate] Diarrhea  . Caffeine Other (See Comments)    Makes heart race  . Codeine Other (See Comments)    Does not like the feeling she gets  . Doxycycline     "Explosive diarrhea"  . Flexeril [Cyclobenzaprine] Other (See Comments)  Pt states "increased heart rate"  . Lipitor [Atorvastatin] Dermatitis    "feels like bugs are biting her"   . Lisinopril     LIP NUMBNESS  . Pravastatin Other (See Comments)    FEELS LIKE "BUG BITES" BITING HER LEGS   . Tape Rash    TEGADERM.   (use opsite on PAC)  . Tegaderm Ag Mesh [Silver] Rash and Other (See Comments)    Burns skin  . Ultram [Tramadol] Palpitations    Consultations:  gi   Procedures/Studies:  No results found.    Subjective: No acute events overnight stable requesting discharge home  Discharge Exam: Vitals:   09/23/19 0850 09/23/19 0923  BP:  127/76  Pulse:  83  Resp:  14  Temp:  98.2 F (36.8 C)  SpO2: 99% 99%   Vitals:   09/22/19 2155 09/23/19 0611 09/23/19 0850 09/23/19 0923  BP: (!) 147/84 132/69  127/76  Pulse: 82 70  83  Resp: 20 19  14   Temp: 98.6 F (37 C) 98.3 F (36.8 C)  98.2 F (36.8 C)  TempSrc: Oral Oral  Oral  SpO2: 100% 100% 99% 99%  Weight:      Height:        General: Pt is alert, awake, not in acute distress Cardiovascular: RRR,  S1/S2 +, no rubs, no gallops Respiratory: CTA bilaterally, no wheezing, no rhonchi Abdominal: Soft, NT, ND, bowel sounds + Extremities: no edema, no cyanosis    The results of significant diagnostics from this hospitalization (including imaging, microbiology, ancillary and laboratory) are listed below for reference.     Microbiology: Recent Results (from the past 240 hour(s))  Urine Culture     Status: Abnormal   Collection Time: 09/13/19 11:15 AM   Specimen: Urine, Random  Result Value Ref Range Status   Specimen Description   Final    URINE, RANDOM Performed at [facility] [address], [place], [place] [address]    Special Requests   Final    NONE Performed at [facility], [place] [address], [place], [place] [address]    Culture MULTIPLE SPECIES PRESENT, SUGGEST RECOLLECTION (A)  Final   Report Status 09/14/2019 FINAL  Final  SARS CORONAVIRUS 2 (TAT 6-24 HRS) Nasopharyngeal Nasopharyngeal Swab     Status: None   Collection Time: 09/21/19  7:47 PM   Specimen: Nasopharyngeal Swab  Result Value Ref Range Status   SARS Coronavirus 2 NEGATIVE NEGATIVE Final    Comment: (NOTE) SARS-CoV-2 target nucleic acids are NOT DETECTED. The SARS-CoV-2 RNA is generally detectable in upper and lower respiratory specimens during the acute phase of infection. Negative results do not preclude SARS-CoV-2 infection, do not rule out co-infections with other pathogens, and should not be used as the sole basis for treatment or other patient management decisions. Negative results must be combined with clinical observations, patient history, and epidemiological information. The expected result is Negative. Fact Sheet for Patients: SugarRoll.be Fact Sheet for Healthcare Providers: https://www.woods-mathews.com/ This test is not yet approved or cleared by the [place] FDA and  has been authorized for detection  and/or diagnosis of SARS-CoV-2 by FDA under an Emergency Use Authorization (EUA). This EUA will remain  in effect (meaning this test can be used) for the duration of the COVID-19 declaration under Section 56 4(b)(1) of the Act, 21 U.S.C. section 360bbb-3(b)(1), unless the authorization is terminated or revoked sooner. Performed at [facility], [place] [address] [place], [place] [address]  Labs: BNP (last 3 results) No results for input(s): BNP in the last 8760 hours. Basic Metabolic Panel: Recent Labs  Lab 09/21/19 1850  NA 136  K 4.3  CL 101  CO2 27  GLUCOSE 115*  BUN 20  CREATININE 0.74  CALCIUM 8.7*   Liver Function Tests: Recent Labs  Lab 09/21/19 1850  AST 18  ALT 15  ALKPHOS 56  BILITOT 0.5  PROT 5.7*  ALBUMIN 3.0*   No results for input(s): LIPASE, AMYLASE in the last 168 hours. No results for input(s): AMMONIA in the last 168 hours. CBC: Recent Labs  Lab 09/21/19 1850 09/22/19 0809 09/23/19 0451  WBC 8.2  --  5.6  NEUTROABS 6.6  --  3.5  HGB 7.5* 9.8* 8.4*  HCT 23.6* 29.4* 26.3*  MCV 107.3*  --  96.7  PLT 204  --  169   Cardiac Enzymes: No results for input(s): CKTOTAL, CKMB, CKMBINDEX, TROPONINI in the last 168 hours. BNP: Invalid input(s): POCBNP CBG: No results for input(s): GLUCAP in the last 168 hours. D-Dimer No results for input(s): DDIMER in the last 72 hours. Hgb A1c No results for input(s): HGBA1C in the last 72 hours. Lipid Profile No results for input(s): CHOL, HDL, LDLCALC, TRIG, CHOLHDL, LDLDIRECT in the last 72 hours. Thyroid function studies No results for input(s): TSH, T4TOTAL, T3FREE, THYROIDAB in the last 72 hours.  Invalid input(s): FREET3 Anemia work up No results for input(s): VITAMINB12, FOLATE, FERRITIN, TIBC, IRON, RETICCTPCT in the last 72 hours. Urinalysis    Component Value Date/Time   COLORURINE YELLOW 09/13/2019 1115   APPEARANCEUR HAZY (A) 09/13/2019 1115   LABSPEC 1.018 09/13/2019 1115    LABSPEC 1.005 05/28/2015 1512   PHURINE 5.0 09/13/2019 1115   GLUCOSEU NEGATIVE 09/13/2019 1115   GLUCOSEU Negative 05/28/2015 1512   HGBUR NEGATIVE 09/13/2019 1115   BILIRUBINUR NEGATIVE 09/13/2019 1115   BILIRUBINUR Negative 05/28/2015 1512   [place] 09/13/2019 1115   PROTEINUR 30 (A) 09/13/2019 1115   UROBILINOGEN 0.2 05/28/2015 1512   NITRITE NEGATIVE 09/13/2019 1115   LEUKOCYTESUR TRACE (A) 09/13/2019 1115   LEUKOCYTESUR Negative 05/28/2015 1512   Sepsis Labs Invalid input(s): PROCALCITONIN,  WBC,  LACTICIDVEN Microbiology Recent Results (from the past 240 hour(s))  Urine Culture     Status: Abnormal   Collection Time: 09/13/19 11:15 AM   Specimen: Urine, Random  Result Value Ref Range Status   Specimen Description   Final    URINE, RANDOM Performed at [facility], [place] [address] [place], [place] [address]    Special Requests   Final    NONE Performed at [facility], [place] [address] [place], [place] [address]    Culture MULTIPLE SPECIES PRESENT, SUGGEST RECOLLECTION (A)  Final   Report Status 09/14/2019 FINAL  Final  SARS CORONAVIRUS 2 (TAT 6-24 HRS) Nasopharyngeal Nasopharyngeal Swab     Status: None   Collection Time: 09/21/19  7:47 PM   Specimen: Nasopharyngeal Swab  Result Value Ref Range Status   SARS Coronavirus 2 NEGATIVE NEGATIVE Final    Comment: (NOTE) SARS-CoV-2 target nucleic acids are NOT DETECTED. The SARS-CoV-2 RNA is generally detectable in upper and lower respiratory specimens during the acute phase of infection. Negative results do not preclude SARS-CoV-2 infection, do not rule out co-infections with other pathogens, and should not be used as the sole basis for treatment or other patient management decisions. Negative results must be combined with clinical observations, patient history, and epidemiological information. The  expected result is Negative. Fact Sheet for  Patients: SugarRoll.be Fact Sheet for Healthcare Providers: https://www.woods-mathews.com/ This test is not yet approved or cleared by the [place] FDA and  has been authorized for detection and/or diagnosis of SARS-CoV-2 by FDA under an Emergency Use Authorization (EUA). This EUA will remain  in effect (meaning this test can be used) for the duration of the COVID-19 declaration under Section 56 4(b)(1) of the Act, 21 U.S.C. section 360bbb-3(b)(1), unless the authorization is terminated or revoked sooner. Performed at [facility], [place] [address] [place], [place] [address]      Time coordinating discharge: Over 30 minutes  SIGNED:   Nicolette Bang, MD  [facility] 09/23/2019, 9:50 AM Pager   If 7PM-7AM, please contact night-coverage [email or web address] Password [email or web address]

## 2019-09-24 ENCOUNTER — Encounter (HOSPITAL_COMMUNITY): Payer: Self-pay | Admitting: Gastroenterology

## 2019-10-01 ENCOUNTER — Other Ambulatory Visit: Payer: Self-pay

## 2019-10-01 DIAGNOSIS — E785 Hyperlipidemia, unspecified: Secondary | ICD-10-CM

## 2019-11-26 ENCOUNTER — Telehealth: Payer: Self-pay | Admitting: Cardiology

## 2019-11-26 ENCOUNTER — Encounter: Payer: Self-pay | Admitting: Orthopaedic Surgery

## 2019-11-26 ENCOUNTER — Ambulatory Visit: Payer: Self-pay

## 2019-11-26 ENCOUNTER — Ambulatory Visit: Payer: Medicare Other | Admitting: Orthopaedic Surgery

## 2019-11-26 ENCOUNTER — Other Ambulatory Visit: Payer: Self-pay

## 2019-11-26 DIAGNOSIS — M25552 Pain in left hip: Secondary | ICD-10-CM

## 2019-11-26 MED ORDER — METHYLPREDNISOLONE 4 MG PO TABS: ORAL_TABLET | ORAL | 0 refills | Status: DC

## 2019-11-26 NOTE — Progress Notes (Signed)
Office Visit Note   Patient: JOELYNN Owens           Date of Birth: [birth year]           MRN: [id number] Visit Date: 11/26/2019              Requested by: Shon Baton, [place],  [place] [address] PCP: Shon Baton, MD   Assessment & Plan: Visit Diagnoses:  1. Pain in left hip     Plan: I would like to send in a 6-day steroid taper.  She will still take some Tylenol for pain.  We will also have Dr. Junius Roads see her for an ultrasound-guided left hip intra-articular injection.  Given the fact that she is dealing with metastatic cancer and hospice care, follow-up can be as needed unless things worsen for her in any way.  All question concerns were answered and addressed.  Follow-Up Instructions: No follow-ups on file.  I would like her to have an intra-articular steroid injection by Dr. Junius Roads under ultrasound at his next available appointment hopefully this week.  Follow-up can then be as needed.  Orders:  Orders Placed This Encounter  Procedures  . XR HIP UNILAT W OR W/O PELVIS 2-3 VIEWS LEFT   Meds ordered this encounter  Medications  . methylPREDNISolone (MEDROL) 4 MG tablet    Sig: Medrol dose pack. Take as instructed    Dispense:  21 tablet    Refill:  0      Procedures: No procedures performed   Clinical Data: No additional findings.   Subjective: Chief Complaint  Patient presents with  . Left Hip - Pain  The patient is a long-term patient of mine.  She had a right total hip replaced in 2013.  She has been dealing with left hip and groin pain now for last few months.  She does have a history of metastatic ovarian cancer.  She is concerned that there may be a cancerous process.  She is in hospice care as well.  She is just taking Tylenol for pain.  She cannot tolerate other pain medications.  She does have a CT scan that I did review in June of this year and I can see her left hip and showed no metastatic lesions around the left hip.  There was a  central area in the femoral head that may be consistent with avascular necrosis.  HPI  Review of Systems She currently denies any headache, chest pain, shortness of breath, fever, chills, nausea, vomiting.  Objective: Vital Signs: There were no vitals taken for this visit.  Physical Exam She is alert and orient x3 and in no acute distress.  She is ambulating with a cane. Ortho Exam Examination of her left hip does show pain in the left groin with internal and external rotation of the left hip. Specialty Comments:  No specialty comments available.  Imaging: XR HIP UNILAT W OR W/O PELVIS 2-3 VIEWS LEFT  Result Date: 11/26/2019 An AP pelvis and lateral left hip shows no acute findings.  The joint space is well-maintained.  There are periarticular osteophytes.  There is a central portion of the femoral head that may have findings consistent with avascular process.  This was compared to a CT scan that was done in June of this year.  The patient does have a history of metastatic ovarian cancer.    PMFS History: Patient Active Problem List   Diagnosis Date Noted  . Acute GI bleeding 09/21/2019  .  Symptomatic anemia 09/21/2019  . AF (paroxysmal atrial fibrillation) ([place])   . Vertigo 01/30/2019  . Chemotherapy-induced nausea 08/15/2018  . Pancytopenia, acquired ([place]) 05/02/2018  . Goals of care, counseling/discussion 04/03/2018  . Chronic right-sided low back pain without sciatica 08/29/2017  . Closed nondisplaced fracture of head of right radius 08/15/2017  . Failed total left knee replacement ([place]) 03/18/2017  . Status post revision of total knee replacement, left 03/18/2017  . Thrombocytopenia ([place]) 03/27/2016  . High risk medications (not anticoagulants) long-term use 03/27/2016  . Chronic anticoagulation 03/27/2016  . Atherosclerotic heart disease of native coronary artery without angina pectoris 03/03/2016  . Bilateral hearing loss 02/16/2016  . Bilateral impacted cerumen  02/16/2016  . Rhinitis, chronic 02/16/2016  . Embolic stroke ([place]) [id number]  . Vaginal atrophy 12/29/2015  . On amiodarone therapy 12/23/2015  . Dyslipidemia, goal LDL below 70   . Stroke with cerebral ischemia ([place])   . Visual disturbance   . Cerebral thrombosis with cerebral infarction 12/08/2015  . NSTEMI (non-ST elevated myocardial infarction) ([place]) 12/04/2015  . 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 Portacath in place 07/23/2015  . Recurrent carcinoma of ovary ([place]) 07/23/2015  . International Federation of Gynecology and Obstetrics (FIGO) 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[place]) 07/23/2015  . Genetic testing 07/14/2015  . Encounter for antineoplastic chemotherapy 06/25/2015  . Port catheter in place 06/25/2015  . Antineoplastic chemotherapy induced pancytopenia ([place]) 06/02/2015  . Dehydration 06/02/2015  . 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 Chemotherapy induced neutropenia ([place]) 05/13/2015  . Chemotherapy induced thrombocytopenia 05/13/2015  . Hyperbilirubinemia 05/06/2015  . Osteoarthritis of left knee 01/03/2015  . Status post total left knee replacement 01/03/2015  . PAF (paroxysmal atrial fibrillation) ([place]): Symptomatic - Rhythm control with Amiodarone. CHA2DS2Vasc Score 7: On Eliquis   . Fever 05/03/2013    Class: Acute  . Nausea 05/03/2013    Class: Acute  . Other constipation 05/03/2013    Class: Acute  . Syncope, history of 04/19/2013  . Essential hypertension 04/19/2013  . Hypothyroidism 04/19/2013  . Ovarian cancer on right ([place]) 03/24/2013  . Degenerative arthritis of hip 02/25/2012   Past Medical History:  Diagnosis Date  . Arthritis    OSTEOARTHRITIS   -- CONSTANT PAIN RIGHT HIP---AND PAIN LEFT KNEE--PT STATES SHE GETS INJECTIONS INTO HER KNEE  . Complication of anesthesia    BLOOD PRESSURE DROPPED WITH NASAL SURGERY, ONE OF THE  CARPAL TUNNEL REPAIRS AND DURING A COLONOSCOPY  . Dyslipidemia   . History of skin cancer   . Hypertension   . Hypothyroidism   . Menopausal symptoms   . NSTEMI (non-ST elevated myocardial infarction) ([place]) 12/09/15   Medical management: Distal branch of D1 95%, ostial D2 75%, distal LAD 50%. Tortuous arteries consistent with hypertension  . Ovarian cancer ([place]) 01/2013   Recurrence since 2014/2016  . PAF (paroxysmal atrial fibrillation) ([place])    On ELIQUIS; On Amiodarone (Eye Exam 12/25/15) - no longer on Rythmol  . Stroke [facility]) 2017   no deficits    Family History  Problem Relation Age of Onset  . Hypertension Mother   . Basal cell carcinoma Mother   . AAA (abdominal aortic aneurysm) Father   . Heart Problems Maternal Uncle   . Heart Problems Maternal Grandfather   . AAA (abdominal aortic aneurysm) Paternal Grandmother   . Prostate cancer Maternal Uncle 70  . Prostate cancer Other   .  Other Daughter        one daughter had TAH-BSO at 82; other daughter will have one soon    Past Surgical History:  Procedure Laterality Date  . BILATERAL CARPAL TUNNEL REPAIR  2007  . BREAST BIOPSY Left 03/19/2014   benign  . CARDIAC CATHETERIZATION N/A 12/05/2015   Procedure: Left Heart Cath and Coronary Angiography;  Surgeon: Belva Crome, MD;  Location: [facility];  Service: Cardiovascular;  distal branch of D1 95%, ostial D2 75%, dLAD 50%,p-mRCA 40%. Tortuous vessels.  Marland Kitchen DILATION AND CURETTAGE OF UTERUS  1969  . ESOPHAGOGASTRODUODENOSCOPY (EGD) WITH PROPOFOL N/A 09/22/2019   Procedure: ESOPHAGOGASTRODUODENOSCOPY (EGD) WITH PROPOFOL;  Surgeon: Milus Banister, MD;  Location: [facility];  Service: Endoscopy;  Laterality: N/A;  . JOINT REPLACEMENT    . LAPAROTOMY Bilateral 02/13/2013   Procedure: EXPLORATORY LAPAROTOMY TOTAL ABDOMINAL HYSTERECTOMY BILATERAL SALPINGO-OOPHORECTOMY, Partial Rectal Resection with Reanastamosis;  Surgeon: Alvino Chapel, MD;  Location: [facility];   Service: Gynecology;  Laterality: Bilateral;  . LEFT KNEE ARTHROSCOPY   2011  . LYMPHADENECTOMY Right 02/13/2013   Procedure: PEVLIC  LYMPHADENECTOMY, DEBULKING right pelvic tumor nodules;  Surgeon: Alvino Chapel, MD;  Location: [facility];  Service: Gynecology;  Laterality: Right;  . NM MYOVIEW LTD  June 2010    subbmaximal with no ischemia or infarction.  . OMENTECTOMY  02/13/2013   Procedure: OMENTECTOMY;  Surgeon: Alvino Chapel, MD;  Location: [facility];  Service: Gynecology;;  . [place]  . SURGERY FOR RUPTURED OVARIAN CYST  1969  . SYNOVECTOMY WITH POLY EXCHANGE Left 03/18/2017   Procedure: LEFT KNEE PARTIAL SYNOVECTOMY WITH POLY EXCHANGE;  Surgeon: Mcarthur Rossetti, MD;  Location: [facility];  Service: Orthopedics;  Laterality: Left;  Adductor Block  . TAH/BSO/Tumor debulking with right pelvic LND  01/2013  . TONSILLECTOMY  1962  . TOTAL HIP ARTHROPLASTY  02/25/2012   Procedure: TOTAL HIP ARTHROPLASTY ANTERIOR APPROACH;  Surgeon: Mcarthur Rossetti, MD;  Location: [facility];  Service: Orthopedics;  Laterality: Right;  . TOTAL KNEE ARTHROPLASTY Left 01/03/2015   Procedure: LEFT TOTAL KNEE ARTHROPLASTY;  Surgeon: Mcarthur Rossetti, MD;  Location: [facility];  Service: Orthopedics;  Laterality: Left;  . TRANSTHORACIC ECHOCARDIOGRAM  May 2014; January 2016   a. Normal LV size function. EF 60-65%. Grade 1 diastolic function. Mild MR and mildly elevated PA pressures of 37 mmHg per;; b. EF 60-65%. Mobile echodensity 10 mm x 6 mm attest interventricular septum is questionable fibroblastoma.  Marland Kitchen TRIGGER FINGER RELEASE Left 12/20/2017   Procedure: RELEASE TRIGGER FINGER/A-1 PULLEY LEFT RING FINGER;  Surgeon: Daryll Brod, MD;  Location: [facility];  Service: Orthopedics;  Laterality: Left;   Social History   Occupational History  . Occupation: retired Therapist, sports  Tobacco Use  . Smoking status: Never Smoker  . Smokeless tobacco: Never Used    Substance and Sexual Activity  . Alcohol use: No    Alcohol/week: 0.0 standard drinks  . Drug use: No  . Sexual activity: Not Currently

## 2019-11-26 NOTE — Telephone Encounter (Signed)
Spoke with patient. Patient advised not to take Advil as it may cause bleeding. Patient to stick with Tylenol as needed. Patient verbalized understanding.

## 2019-11-26 NOTE — Telephone Encounter (Signed)
Patient states she had a GI bleed back in October. She was taken off of Eliquis and currently does not take any blood thinners. She was told she could take Tylenol but she wants to know if she can also take Advil.

## 2019-11-29 ENCOUNTER — Other Ambulatory Visit: Payer: Self-pay | Admitting: Cardiology

## 2019-11-29 ENCOUNTER — Other Ambulatory Visit: Payer: Self-pay

## 2019-11-29 ENCOUNTER — Encounter: Payer: Self-pay | Admitting: Family Medicine

## 2019-11-29 ENCOUNTER — Ambulatory Visit: Payer: Medicare Other | Admitting: Family Medicine

## 2019-11-29 ENCOUNTER — Ambulatory Visit: Payer: Self-pay

## 2019-11-29 DIAGNOSIS — M25552 Pain in left hip: Secondary | ICD-10-CM

## 2019-11-29 NOTE — Progress Notes (Signed)
Subjective: Patient is here for ultrasound-guided intra-articular left hip injection.   She has left groin pain with arthritis on x-ray.  She has metastatic cancer and is not tolerating chemotherapy.  Objective: She has pain in the left groin area with passive flexion and internal rotation.  Procedure: Ultrasound-guided left hip injection: After sterile prep with Betadine, injected 8 cc 1% lidocaine without epinephrine and 40 mg methylprednisolone using a 22-gauge spinal needle, passing the needle through the iliofemoral ligament into the femoral head/neck junction.  Injectate was seen filling the joint capsule.  She had good immediate relief.  Follow-up as needed.

## 2019-12-04 ENCOUNTER — Other Ambulatory Visit: Payer: Self-pay | Admitting: Cardiology

## 2019-12-04 NOTE — Telephone Encounter (Signed)
So I think it is pretty reasonable if she is not currently in A. fib for her to back down to take 100 mg daily.  However if she has a breakthrough episode she should bump up to 400 mg twice a day until she is back out of it and then go back to 200 mg a day for a 4 d before going back to 100 mg daily.  The idea is to increase dose for breakthrough spells  Glenetta Hew, MD

## 2019-12-04 NOTE — Telephone Encounter (Signed)
Please instruct on pt Amiodarone doses, pt stated she is not in Afib, but her Amiodarone medication was increase to 200 mg daily, should pt be taking 200 mg or 100 mg daily

## 2019-12-04 NOTE — Telephone Encounter (Signed)
*  STAT* If patient is at the pharmacy, call can be transferred to refill team.   1. Which medications need to be refilled? (please list name of each medication and dose if known) amiodarone (PACERONE) 200 MG tablet  2. Which pharmacy/location (including street and city if local pharmacy) is medication to be sent to? [id number] - [place], [place]  3. Do they need a 30 day or 90 day supply? 90  Patient states that this medication was increased from 0.5 tablet to a whole tablet back in October. [facility]

## 2019-12-05 ENCOUNTER — Encounter: Payer: Self-pay | Admitting: *Deleted

## 2019-12-05 MED ORDER — AMIODARONE HCL 200 MG PO TABS: 100.0000 mg | ORAL_TABLET | Freq: Every day | ORAL | 6 refills | Status: DC

## 2019-12-14 NOTE — Telephone Encounter (Signed)
See other telephone call

## 2019-12-28 ENCOUNTER — Ambulatory Visit: Payer: Medicare Other

## 2020-01-04 ENCOUNTER — Encounter: Payer: Self-pay | Admitting: Podiatry

## 2020-01-04 ENCOUNTER — Ambulatory Visit: Payer: Medicare Other | Admitting: Podiatry

## 2020-01-04 ENCOUNTER — Other Ambulatory Visit: Payer: Self-pay

## 2020-01-04 DIAGNOSIS — T451X5A Adverse effect of antineoplastic and immunosuppressive drugs, initial encounter: Secondary | ICD-10-CM | POA: Diagnosis not present

## 2020-01-04 DIAGNOSIS — M79672 Pain in left foot: Secondary | ICD-10-CM | POA: Diagnosis not present

## 2020-01-04 DIAGNOSIS — G620 Drug-induced polyneuropathy: Secondary | ICD-10-CM | POA: Diagnosis not present

## 2020-01-04 DIAGNOSIS — Q828 Other specified congenital malformations of skin: Secondary | ICD-10-CM | POA: Diagnosis not present

## 2020-01-04 NOTE — Patient Instructions (Signed)

## 2020-01-05 ENCOUNTER — Ambulatory Visit: Payer: Medicare Other

## 2020-01-05 NOTE — Progress Notes (Signed)
Subjective: [name] presents today referred by Shon Baton, MD for complaint of painful callus(es) plantar aspect left foot which has been present for "years". Over the years, she has seen Podiatrists and gotten pedicures. She has also undergone chemotherapy in 2014 for ovarian cancer. 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 .   Past Medical History:  Diagnosis Date  . Arthritis    OSTEOARTHRITIS   -- CONSTANT PAIN RIGHT HIP---AND PAIN LEFT KNEE--PT STATES SHE GETS INJECTIONS INTO HER KNEE  . Complication of anesthesia    BLOOD PRESSURE DROPPED WITH NASAL SURGERY, ONE OF THE CARPAL TUNNEL REPAIRS AND DURING A COLONOSCOPY  . Dyslipidemia   . History of skin cancer   . Hypertension   . Hypothyroidism   . Menopausal symptoms   . NSTEMI (non-ST elevated myocardial infarction) ([place]) 12/09/15   Medical management: Distal branch of D1 95%, ostial D2 75%, distal LAD 50%. Tortuous arteries consistent with hypertension  . Ovarian cancer ([place]) 01/2013   Recurrence since 2014/2016  . PAF (paroxysmal atrial fibrillation) ([place])    On ELIQUIS; On Amiodarone (Eye Exam 12/25/15) - no longer on Rythmol  . Stroke [facility]) 2017   no deficits     Patient Active Problem List   Diagnosis Date Noted  . Acute GI bleeding 09/21/2019  . Symptomatic anemia 09/21/2019  . AF (paroxysmal atrial fibrillation) ([place])   . Vertigo 01/30/2019  . Chemotherapy-induced nausea 08/15/2018  . Pancytopenia, acquired ([place]) 05/02/2018  . Goals of care, counseling/discussion 04/03/2018  . Swan-neck deformity of finger, right 02/01/2018  . Chronic right-sided low back pain without sciatica 08/29/2017  . Closed nondisplaced fracture of head of right radius 08/15/2017  . Osteoarthritis of finger of left hand 07/04/2017  . Pain in finger of left hand 07/04/2017  . Trigger ring finger of left hand 07/04/2017  . 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 Status post revision of total knee replacement, left  03/18/2017  . Thrombocytopenia ([place]) 03/27/2016  . High risk medications (not anticoagulants) long-term use 03/27/2016  . Chronic anticoagulation 03/27/2016  . Atherosclerotic heart disease of native coronary artery without angina pectoris 03/03/2016  . Bilateral hearing loss 02/16/2016  . Bilateral impacted cerumen 02/16/2016  . Rhinitis, chronic 02/16/2016  . Embolic stroke ([place]) [id number]  . Vaginal atrophy 12/29/2015  . On amiodarone therapy 12/23/2015  . Dyslipidemia, goal LDL below 70   . Stroke with cerebral ischemia ([place])   . Visual disturbance   . Cerebral thrombosis with cerebral infarction 12/08/2015  . NSTEMI (non-ST elevated myocardial infarction) ([place]) 12/04/2015  . 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 Chemotherapy-induced peripheral neuropathy ([place]) 07/23/2015  . Portacath in place 07/23/2015  . Recurrent carcinoma of ovary ([place]) 07/23/2015  . International Federation of Gynecology and Obstetrics (FIGO) 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[place]) 07/23/2015  . Genetic testing 07/14/2015  . Encounter for antineoplastic chemotherapy 06/25/2015  . Port catheter in place 06/25/2015  . Antineoplastic chemotherapy induced pancytopenia ([place]) 06/02/2015  . Dehydration 06/02/2015  . 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 Chemotherapy induced neutropenia ([place]) 05/13/2015  . Chemotherapy induced thrombocytopenia 05/13/2015  . Hyperbilirubinemia 05/06/2015  . Osteoarthritis of left knee 01/03/2015  . Status post total left knee replacement 01/03/2015  . PAF (paroxysmal atrial fibrillation) ([place]): Symptomatic - Rhythm control with Amiodarone. CHA2DS2Vasc Score 7: On Eliquis   . Fever 05/03/2013    Class: Acute  . Nausea 05/03/2013    Class: Acute  .  Other constipation 05/03/2013    Class: Acute  . Syncope, history of 04/19/2013  . Essential hypertension 04/19/2013  . Hypothyroidism 04/19/2013  . Ovarian cancer on right  ([place]) 03/24/2013  . Degenerative arthritis of hip 02/25/2012     Past Surgical History:  Procedure Laterality Date  . BILATERAL CARPAL TUNNEL REPAIR  2007  . BREAST BIOPSY Left 03/19/2014   benign  . CARDIAC CATHETERIZATION N/A 12/05/2015   Procedure: Left Heart Cath and Coronary Angiography;  Surgeon: Belva Crome, MD;  Location: [facility];  Service: Cardiovascular;  distal branch of D1 95%, ostial D2 75%, dLAD 50%,p-mRCA 40%. Tortuous vessels.  Marland Kitchen DILATION AND CURETTAGE OF UTERUS  1969  . ESOPHAGOGASTRODUODENOSCOPY (EGD) WITH PROPOFOL N/A 09/22/2019   Procedure: ESOPHAGOGASTRODUODENOSCOPY (EGD) WITH PROPOFOL;  Surgeon: Milus Banister, MD;  Location: [facility];  Service: Endoscopy;  Laterality: N/A;  . JOINT REPLACEMENT    . LAPAROTOMY Bilateral 02/13/2013   Procedure: EXPLORATORY LAPAROTOMY TOTAL ABDOMINAL HYSTERECTOMY BILATERAL SALPINGO-OOPHORECTOMY, Partial Rectal Resection with Reanastamosis;  Surgeon: Alvino Chapel, MD;  Location: [facility];  Service: Gynecology;  Laterality: Bilateral;  . LEFT KNEE ARTHROSCOPY   2011  . LYMPHADENECTOMY Right 02/13/2013   Procedure: PEVLIC  LYMPHADENECTOMY, DEBULKING right pelvic tumor nodules;  Surgeon: Alvino Chapel, MD;  Location: [facility];  Service: Gynecology;  Laterality: Right;  . NM MYOVIEW LTD  June 2010    subbmaximal with no ischemia or infarction.  . OMENTECTOMY  02/13/2013   Procedure: OMENTECTOMY;  Surgeon: Alvino Chapel, MD;  Location: [facility];  Service: Gynecology;;  . [place]  . SURGERY FOR RUPTURED OVARIAN CYST  1969  . SYNOVECTOMY WITH POLY EXCHANGE Left 03/18/2017   Procedure: LEFT KNEE PARTIAL SYNOVECTOMY WITH POLY EXCHANGE;  Surgeon: Mcarthur Rossetti, MD;  Location: [facility];  Service: Orthopedics;  Laterality: Left;  Adductor Block  . TAH/BSO/Tumor debulking with right pelvic LND  01/2013  . TONSILLECTOMY  1962  . TOTAL HIP ARTHROPLASTY  02/25/2012   Procedure:  TOTAL HIP ARTHROPLASTY ANTERIOR APPROACH;  Surgeon: Mcarthur Rossetti, MD;  Location: [facility];  Service: Orthopedics;  Laterality: Right;  . TOTAL KNEE ARTHROPLASTY Left 01/03/2015   Procedure: LEFT TOTAL KNEE ARTHROPLASTY;  Surgeon: Mcarthur Rossetti, MD;  Location: [facility];  Service: Orthopedics;  Laterality: Left;  . TRANSTHORACIC ECHOCARDIOGRAM  May 2014; January 2016   a. Normal LV size function. EF 60-65%. Grade 1 diastolic function. Mild MR and mildly elevated PA pressures of 37 mmHg per;; b. EF 60-65%. Mobile echodensity 10 mm x 6 mm attest interventricular septum is questionable fibroblastoma.  Marland Kitchen TRIGGER FINGER RELEASE Left 12/20/2017   Procedure: RELEASE TRIGGER FINGER/A-1 PULLEY LEFT RING FINGER;  Surgeon: Daryll Brod, MD;  Location: [facility];  Service: Orthopedics;  Laterality: Left;     Current Outpatient Medications on File Prior to Visit  Medication Sig Dispense Refill  . acetaminophen (TYLENOL) 500 MG tablet Take 1,000 mg by mouth every 6 (six) hours as needed for mild pain.     Marland Kitchen amiodarone (PACERONE) 200 MG tablet Take 0.5 tablets (100 mg total) by mouth daily. If breakthrough afib. epsiode may take 400 mg twice a day until resolves then follow instructions 70 tablet 6  . Biotin 10 MG TABS Take 20 mg by mouth daily.    Marland Kitchen co-enzyme Q-10 30 MG capsule Take 100 mg by mouth daily.     Marland Kitchen desonide (DESOWEN) 0.05 % cream Apply  1 application topically 2 (two) times daily.   1  . diphenhydrAMINE (BENADRYL) 25 MG tablet Take 50 mg by mouth at bedtime as needed for sleep.     Marland Kitchen ezetimibe (ZETIA) 10 MG tablet Take 1 tablet (10 mg total) by mouth daily. (Patient taking differently: Take 10 mg by mouth at bedtime. ) 30 tablet 6  . Glucosamine HCl 1000 MG TABS Take 1,000 mg by mouth daily.    . hydrocortisone 1 % ointment Apply 1 application topically 2 (two) times daily as needed for itching (eczema).     Marland Kitchen ipratropium (ATROVENT) 0.06 % nasal spray Place 2 sprays into  both nostrils 2 (two) times daily.   5  . lidocaine-prilocaine (EMLA) cream APPLY TO PORTA-CATH SITE 1-2 HOURS PRIOR TO ACCESS AS DIRECTED. (Patient taking differently: Apply 1 application topically as needed. Apply to Porta-Cath site 1-2 hours prior to access as directed.) 30 g 1  . Melatonin 5 MG CAPS Take 5 mg by mouth at bedtime as needed (sleep). Reported on 12/25/2015    . Menthol, Topical Analgesic, (ICY HOT EX) Apply 1 application topically 2 (two) times daily as needed (PAIN).     Marland Kitchen methylPREDNISolone (MEDROL) 4 MG tablet Medrol dose pack. Take as instructed 21 tablet 0  . Multiple Vitamin (MULTIVITAMIN WITH MINERALS) TABS tablet Take 1 tablet by mouth daily. Centrum Silver    . ofloxacin (OCUFLOX) 0.3 % ophthalmic solution Place 1 drop into the left eye 4 (four) times daily.    . ondansetron (ZOFRAN) 8 MG tablet TAKE 1 TABLET (8 MG TOTAL) BY MOUTH EVERY 8 (EIGHT) HOURS AS NEEDED FOR NAUSEA OR VOMITING. (Patient taking differently: Take 8 mg by mouth daily as needed for nausea or vomiting. ) 90 tablet 1  . oxyCODONE (OXY IR/ROXICODONE) 5 MG immediate release tablet Take 5 mg by mouth 2 (two) times daily as needed.    Vladimir Faster Glycol-Propyl Glycol (SYSTANE OP) Place 1 drop into both eyes 2 (two) times daily.     . polyethylene glycol (MIRALAX / GLYCOLAX) packet Take 17 g by mouth daily. Mix in 8 oz liquid and drink    . potassium chloride (K-DUR) 10 MEQ tablet Take 10 mEq by mouth daily.    . prednisoLONE acetate (PRED FORTE) 1 % ophthalmic suspension Place 1 drop into the left eye 4 (four) times daily.    . prochlorperazine (COMPAZINE) 10 MG tablet TAKE 1 TABLET (10 MG TOTAL) BY MOUTH EVERY 6 (SIX) HOURS AS NEEDED (NAUSEA OR VOMITING). (Patient taking differently: Take 10 mg by mouth daily as needed (Nausea or vomiting). ) 30 tablet 1  . SENEXON-S 8.6-50 MG tablet Take 1 tablet by mouth daily as needed for mild constipation.     Marland Kitchen SYNTHROID 88 MCG tablet Take 88 mcg by mouth daily.      Current Facility-Administered Medications on File Prior to Visit  Medication Dose Route Frequency Provider Last Rate Last Admin  . sodium chloride flush (NS) 0.9 % injection 10 mL  10 mL Intravenous PRN Cross, Melissa D, NP   10 mL at 01/06/18 1220     Allergies  Allergen Reactions  . Clonidine Derivatives Shortness Of Breath  . Coreg [Carvedilol] Shortness Of Breath  . Augmentin [Amoxicillin-Pot Clavulanate] Diarrhea  . Caffeine Other (See Comments)    Makes heart race  . Codeine Other (See Comments)    Does not like the feeling she gets  . Doxycycline     "Explosive diarrhea"  . Flexeril [Cyclobenzaprine]  Other (See Comments)    Pt states "increased heart rate"  . Lipitor [Atorvastatin] Dermatitis    "feels like bugs are biting her"   . Lisinopril     LIP NUMBNESS  . Pravastatin Other (See Comments)    FEELS LIKE "BUG BITES" BITING HER LEGS   . Tape Rash    TEGADERM.   (use opsite on PAC)  . Tegaderm Ag Mesh [Silver] Rash and Other (See Comments)    Burns skin  . Ultram [Tramadol] Palpitations     Social History   Occupational History  . Occupation: retired Therapist, sports  Tobacco Use  . Smoking status: Never Smoker  . Smokeless tobacco: Never Used  Substance and Sexual Activity  . Alcohol use: No    Alcohol/week: 0.0 standard drinks  . Drug use: No  . Sexual activity: Not Currently     Family History  Problem Relation Age of Onset  . Hypertension Mother   . Basal cell carcinoma Mother   . AAA (abdominal aortic aneurysm) Father   . Heart Problems Maternal Uncle   . Heart Problems Maternal Grandfather   . AAA (abdominal aortic aneurysm) Paternal Grandmother   . Prostate cancer Maternal Uncle 70  . Prostate cancer Other   . Other Daughter        one daughter had TAH-BSO at 4; other daughter will have one soon     Immunization History  Administered Date(s) Administered  . DTaP 02/14/2016  . Influenza, High Dose Seasonal PF 08/14/2018  . Influenza,inj,Quad PF,6+  Mos 09/25/2013, 09/12/2015, 09/13/2016     Objective: There were no vitals filed for this visit.  Vascular Examination:  Capillary refill time to digits immediate b/l, palpable DP pulses b/l, palpable PT pulses b/l, pedal hair sparse b/l and skin temperature gradient within normal limits b/l  Dermatological Examination: Pedal skin with normal turgor, texture and tone bilaterally, no open wounds bilaterally, no interdigital macerations bilaterally and porokeratotic lesion(s) plantar aspect of left foot. No erythema, no edema, no drainage, no flocculence  Musculoskeletal: Normal muscle strength 5/5 to all lower extremity muscle groups bilaterally, no pain crepitus or joint limitation noted with ROM b/l, bunion deformity noted b/l and hammertoes noted to the  2-5 bilaterally  Neurological: Protective sensation intact 5/5 intact bilaterally with 10g monofilament b/l, vibratory sensation intact b/l and proprioception intact bilaterally  Assessment: 1. Porokeratosis   2. Left foot pain   3. Chemotherapy-induced peripheral neuropathy (HCC)     Plan: -Painful porokeratotic lesions plantar left foot pared and enucleated with sterile scalpel blade. O&P, Betha offloaded shoe insert to provide comfort during ambulation. -She has chemotherapy induced peripheral neuropathy and lesion will need to be pared at least quarterly to prevent ulceration. Will obtain authorization for future visits and inform patient of insurance decision. -Patient to continue soft, supportive shoe gear daily. -Patient to report any pedal injuries to medical professional immediately. -Patient/POA to call should there be question/concern in the interim.  Return in 3 months (on 04/02/2020), or if symptoms worsen or fail to improve, for nail and callus trim.

## 2020-01-08 ENCOUNTER — Ambulatory Visit: Payer: Medicare Other

## 2020-02-12 ENCOUNTER — Telehealth: Payer: Self-pay | Admitting: *Deleted

## 2020-02-12 NOTE — Telephone Encounter (Signed)
I told pt that if she had tried the padding off then she should make an appt. I transferred pt to schedulers.

## 2020-02-12 NOTE — Telephone Encounter (Signed)
Pt states she was seen 01/04/2020 by Dr. Elisha Ponder and a corn was trimmed from beneath the left 1st toe, it has returned and is very painful, has tried all Dr. Felicie Morn products without results, and next appt is 04/08/2020.

## 2020-03-31 ENCOUNTER — Telehealth: Payer: Self-pay | Admitting: Orthopaedic Surgery

## 2020-03-31 ENCOUNTER — Telehealth: Payer: Self-pay | Admitting: Family Medicine

## 2020-03-31 NOTE — Telephone Encounter (Signed)
Yes ma'am can we just schedule her with Dr. Junius Roads for that injection please

## 2020-03-31 NOTE — Telephone Encounter (Signed)
Patient called advised she had an injection done by Dr Junius Roads and would like to get another injection in her left hip. The number to contact patient is [phone]

## 2020-03-31 NOTE — Telephone Encounter (Signed)
Called patient left message to return call to schedule an appointment with Dr Junius Roads for left hip injection

## 2020-04-08 ENCOUNTER — Ambulatory Visit: Payer: Medicare Other | Admitting: Family Medicine

## 2020-04-08 ENCOUNTER — Other Ambulatory Visit: Payer: Self-pay

## 2020-04-08 ENCOUNTER — Ambulatory Visit: Payer: Self-pay

## 2020-04-08 ENCOUNTER — Ambulatory Visit: Payer: Medicare Other | Admitting: Podiatry

## 2020-04-08 ENCOUNTER — Encounter: Payer: Self-pay | Admitting: Family Medicine

## 2020-04-08 DIAGNOSIS — M25552 Pain in left hip: Secondary | ICD-10-CM | POA: Diagnosis not present

## 2020-04-08 NOTE — Progress Notes (Signed)
Office Visit Note   Patient: [name]           Date of Birth: [birth year]           MRN: [id number] Visit Date: 04/08/2020 Requested by: Shon Baton, [place],  [place] [id number] PCP: Shon Baton, MD  Subjective: Chief Complaint  Patient presents with  . Left Hip - Pain    Intra-articular injection - had 4 months of relief from the last one.    HPI: She is here with recurrent left hip pain.  Symptoms started a couple weeks ago, no injury.  Intermittent catching pain in the front of her hip with pain getting in and out of the vehicle and sometimes with walking.  She was pleased with her results from the last injection and would like to try another one.  Last injection was in December.              ROS:   All other systems were reviewed and are negative.  Objective: Vital Signs: There were no vitals taken for this visit.  Physical Exam:  General:  Alert and oriented, in no acute distress. Pulm:  Breathing unlabored. Psy:  Normal mood, congruent affect.  Left hip: She has pain with passive internal rotation and she walks with a limp.  Imaging: US Guided Needle Placement - No Linked Charges  Result Date: 04/08/2020 Ultrasound-guided left hip injection: After sterile prep with Betadine, injected 8 cc 1% lidocaine without epinephrine and 40 mg methylprednisolone using a 22-gauge spinal needle, passing the needle through the iliofemoral ligament into the femoral head/neck junction.  Injectate was seen filling the joint capsule.    Assessment & Plan: 1.  Left hip DJD -Injection given as above.  She will follow-up as needed.  We can repeat injection as long as she continues to get relief.     Procedures: No procedures performed  No notes on file     PMFS History: Patient Active Problem List   Diagnosis Date Noted  . Acute GI bleeding 09/21/2019  . Symptomatic anemia 09/21/2019  . AF (paroxysmal atrial fibrillation) ([place])   . Vertigo 01/30/2019    . Chemotherapy-induced nausea 08/15/2018  . Pancytopenia, acquired ([place]) 05/02/2018  . Goals of care, counseling/discussion 04/03/2018  . Swan-neck deformity of finger, right 02/01/2018  . Chronic right-sided low back pain without sciatica 08/29/2017  . Closed nondisplaced fracture of head of right radius 08/15/2017  . Osteoarthritis of finger of left hand 07/04/2017  . Pain in finger of left hand 07/04/2017  . Trigger ring finger of left hand 07/04/2017  . Failed total left knee replacement ([place]) 03/18/2017  . Status post revision of total knee replacement, left 03/18/2017  . Thrombocytopenia ([place]) 03/27/2016  . High risk medications (not anticoagulants) long-term use 03/27/2016  . Chronic anticoagulation 03/27/2016  . Atherosclerotic heart disease of native coronary artery without angina pectoris 03/03/2016  . Bilateral hearing loss 02/16/2016  . Bilateral impacted cerumen 02/16/2016  . Rhinitis, chronic 02/16/2016  . Embolic stroke ([place]) 99991111  . Vaginal atrophy 12/29/2015  . On amiodarone therapy 12/23/2015  . Dyslipidemia, goal LDL below 70   . Stroke with cerebral ischemia ([place])   . Visual disturbance   . Cerebral thrombosis with cerebral infarction 12/08/2015  . NSTEMI (non-ST elevated myocardial infarction) ([place]) 12/04/2015  . 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 Chemotherapy-induced peripheral neuropathy ([place]) 07/23/2015  . Portacath in place 07/23/2015  .  Recurrent carcinoma of ovary ([place]) 07/23/2015  . International Federation of Gyne[employer] and Obstetrics (FIGO) 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[place]) 07/23/2015  . Genetic testing 07/14/2015  . Encounter for antineoplastic chemotherapy 06/25/2015  . Port catheter in place 06/25/2015  . Antineoplastic chemotherapy induced pancytopenia ([place]) 06/02/2015  . Dehydration 06/02/2015  . 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 Chemotherapy induced neutropenia ([place])  05/13/2015  . Chemotherapy induced thrombocytopenia 05/13/2015  . Hyperbilirubinemia 05/06/2015  . Osteoarthritis of left knee 01/03/2015  . Status post total left knee replacement 01/03/2015  . PAF (paroxysmal atrial fibrillation) ([place]): Symptomatic - Rhythm control with Amiodarone. CHA2DS2Vasc Score 7: On Eliquis   . Fever 05/03/2013    Class: Acute  . Nausea 05/03/2013    Class: Acute  . Other constipation 05/03/2013    Class: Acute  . Syncope, history of 04/19/2013  . Essential hypertension 04/19/2013  . Hypothyroidism 04/19/2013  . Ovarian cancer on right ([place]) 03/24/2013  . Degenerative arthritis of hip 02/25/2012   Past Medical History:  Diagnosis Date  . Arthritis    OSTEOARTHRITIS   -- CONSTANT PAIN RIGHT HIP---AND PAIN LEFT KNEE--PT STATES SHE GETS INJECTIONS INTO HER KNEE  . Complication of anesthesia    BLOOD PRESSURE DROPPED WITH NASAL SURGERY, ONE OF THE CARPAL TUNNEL REPAIRS AND DURING A COLONOSCOPY  . Dyslipidemia   . History of skin cancer   . Hypertension   . Hypothyroidism   . Menopausal symptoms   . NSTEMI (non-ST elevated myocardial infarction) ([place]) 12/09/15   Medical management: Distal branch of D1 95%, ostial D2 75%, distal LAD 50%. Tortuous arteries consistent with hypertension  . Ovarian cancer ([place]) 01/2013   Recurrence since 2014/2016  . PAF (paroxysmal atrial fibrillation) ([place])    On ELIQUIS; On Amiodarone (Eye Exam 12/25/15) - no longer on Rythmol  . Stroke [facility]) 2017   no deficits    Family History  Problem Relation Age of Onset  . Hypertension Mother   . Basal cell carcinoma Mother   . AAA (abdominal aortic aneurysm) Father   . Heart Problems Maternal Uncle   . Heart Problems Maternal Grandfather   . AAA (abdominal aortic aneurysm) Paternal Grandmother   . Prostate cancer Maternal Uncle 70  . Prostate cancer Other   . Other Daughter        one daughter had TAH-BSO at 36; other daughter will have one soon    Past Surgical History:    Procedure Laterality Date  . BILATERAL CARPAL TUNNEL REPAIR  2007  . BREAST BIOPSY Left 03/19/2014   benign  . CARDIAC CATHETERIZATION N/A 12/05/2015   Procedure: Left Heart Cath and Coronary Angiography;  Surgeon: Belva Crome, MD;  Location: [facility];  Service: Cardiovascular;  distal branch of D1 95%, ostial D2 75%, dLAD 50%,p-mRCA 40%. Tortuous vessels.  Marland Kitchen DILATION AND CURETTAGE OF UTERUS  1969  . ESOPHAGOGASTRODUODENOSCOPY (EGD) WITH PROPOFOL N/A 09/22/2019   Procedure: ESOPHAGOGASTRODUODENOSCOPY (EGD) WITH PROPOFOL;  Surgeon: Milus Banister, MD;  Location: [facility];  Service: Endoscopy;  Laterality: N/A;  . JOINT REPLACEMENT    . LAPAROTOMY Bilateral 02/13/2013   Procedure: EXPLORATORY LAPAROTOMY TOTAL ABDOMINAL HYSTERECTOMY BILATERAL SALPINGO-OOPHORECTOMY, Partial Rectal Resection with Reanastamosis;  Surgeon: Alvino Chapel, MD;  Location: [facility];  Service: Gynecology;  Laterality: Bilateral;  . LEFT KNEE ARTHROSCOPY   2011  . LYMPHADENECTOMY Right 02/13/2013   Procedure: PEVLIC  LYMPHADENECTOMY, DEBULKING right pelvic tumor nodules;  Surgeon: Alvino Chapel, MD;  Location:  [facility];  Service: Gynecology;  Laterality: Right;  . NM MYOVIEW LTD  June 2010    subbmaximal with no ischemia or infarction.  . OMENTECTOMY  02/13/2013   Procedure: OMENTECTOMY;  Surgeon: Alvino Chapel, MD;  Location: [facility];  Service: Gynecology;;  . [place]  . SURGERY FOR RUPTURED OVARIAN CYST  1969  . SYNOVECTOMY WITH POLY EXCHANGE Left 03/18/2017   Procedure: LEFT KNEE PARTIAL SYNOVECTOMY WITH POLY EXCHANGE;  Surgeon: Mcarthur Rossetti, MD;  Location: [facility];  Service: Orthopedics;  Laterality: Left;  Adductor Block  . TAH/BSO/Tumor debulking with right pelvic LND  01/2013  . TONSILLECTOMY  1962  . TOTAL HIP ARTHROPLASTY  02/25/2012   Procedure: TOTAL HIP ARTHROPLASTY ANTERIOR APPROACH;  Surgeon: Mcarthur Rossetti, MD;  Location:  [facility];  Service: Orthopedics;  Laterality: Right;  . TOTAL KNEE ARTHROPLASTY Left 01/03/2015   Procedure: LEFT TOTAL KNEE ARTHROPLASTY;  Surgeon: Mcarthur Rossetti, MD;  Location: [facility];  Service: Orthopedics;  Laterality: Left;  . TRANSTHORACIC ECHOCARDIOGRAM  May 2014; January 2016   a. Normal LV size function. EF 60-65%. Grade 1 diastolic function. Mild MR and mildly elevated PA pressures of 37 mmHg per;; b. EF 60-65%. Mobile echodensity 10 mm x 6 mm attest interventricular septum is questionable fibroblastoma.  Marland Kitchen TRIGGER FINGER RELEASE Left 12/20/2017   Procedure: RELEASE TRIGGER FINGER/A-1 PULLEY LEFT RING FINGER;  Surgeon: Daryll Brod, MD;  Location: [facility];  Service: Orthopedics;  Laterality: Left;   Social History   Occupational History  . Occupation: retired Therapist, sports  Tobacco Use  . Smoking status: Never Smoker  . Smokeless tobacco: Never Used  Substance and Sexual Activity  . Alcohol use: No    Alcohol/week: 0.0 standard drinks  . Drug use: No  . Sexual activity: Not Currently

## 2020-04-09 ENCOUNTER — Emergency Department (HOSPITAL_COMMUNITY)

## 2020-04-09 ENCOUNTER — Inpatient Hospital Stay (HOSPITAL_COMMUNITY)
Admission: EM | Admit: 2020-04-09 | Discharge: 2020-04-11 | DRG: 065 | Disposition: A | Discharge status: Hospice | Attending: Neurology | Admitting: Neurology

## 2020-04-09 DIAGNOSIS — E785 Hyperlipidemia, unspecified: Secondary | ICD-10-CM | POA: Diagnosis present

## 2020-04-09 DIAGNOSIS — Z90710 Acquired absence of both cervix and uterus: Secondary | ICD-10-CM

## 2020-04-09 DIAGNOSIS — I618 Other nontraumatic intracerebral hemorrhage: Principal | ICD-10-CM | POA: Diagnosis present

## 2020-04-09 DIAGNOSIS — C569 Malignant neoplasm of unspecified ovary: Secondary | ICD-10-CM | POA: Diagnosis present

## 2020-04-09 DIAGNOSIS — I10 Essential (primary) hypertension: Secondary | ICD-10-CM | POA: Diagnosis present

## 2020-04-09 DIAGNOSIS — Z515 Encounter for palliative care: Secondary | ICD-10-CM | POA: Diagnosis present

## 2020-04-09 DIAGNOSIS — Z8249 Family history of ischemic heart disease and other diseases of the circulatory system: Secondary | ICD-10-CM | POA: Diagnosis not present

## 2020-04-09 DIAGNOSIS — C7989 Secondary malignant neoplasm of other specified sites: Secondary | ICD-10-CM | POA: Diagnosis present

## 2020-04-09 DIAGNOSIS — Z20822 Contact with and (suspected) exposure to covid-19: Secondary | ICD-10-CM | POA: Diagnosis present

## 2020-04-09 DIAGNOSIS — Z79890 Hormone replacement therapy (postmenopausal): Secondary | ICD-10-CM | POA: Diagnosis not present

## 2020-04-09 DIAGNOSIS — Z808 Family history of malignant neoplasm of other organs or systems: Secondary | ICD-10-CM

## 2020-04-09 DIAGNOSIS — Z96652 Presence of left artificial knee joint: Secondary | ICD-10-CM | POA: Diagnosis present

## 2020-04-09 DIAGNOSIS — I252 Old myocardial infarction: Secondary | ICD-10-CM | POA: Diagnosis not present

## 2020-04-09 DIAGNOSIS — G8191 Hemiplegia, unspecified affecting right dominant side: Secondary | ICD-10-CM | POA: Diagnosis present

## 2020-04-09 DIAGNOSIS — Z79899 Other long term (current) drug therapy: Secondary | ICD-10-CM

## 2020-04-09 DIAGNOSIS — Z85828 Personal history of other malignant neoplasm of skin: Secondary | ICD-10-CM | POA: Diagnosis not present

## 2020-04-09 DIAGNOSIS — Z66 Do not resuscitate: Secondary | ICD-10-CM | POA: Diagnosis present

## 2020-04-09 DIAGNOSIS — C7889 Secondary malignant neoplasm of other digestive organs: Secondary | ICD-10-CM | POA: Diagnosis present

## 2020-04-09 DIAGNOSIS — I610 Nontraumatic intracerebral hemorrhage in hemisphere, subcortical: Secondary | ICD-10-CM | POA: Diagnosis not present

## 2020-04-09 DIAGNOSIS — I361 Nonrheumatic tricuspid (valve) insufficiency: Secondary | ICD-10-CM | POA: Diagnosis not present

## 2020-04-09 DIAGNOSIS — I619 Nontraumatic intracerebral hemorrhage, unspecified: Secondary | ICD-10-CM | POA: Diagnosis present

## 2020-04-09 DIAGNOSIS — Z96641 Presence of right artificial hip joint: Secondary | ICD-10-CM | POA: Diagnosis present

## 2020-04-09 DIAGNOSIS — Z8673 Personal history of transient ischemic attack (TIA), and cerebral infarction without residual deficits: Secondary | ICD-10-CM

## 2020-04-09 DIAGNOSIS — I480 Paroxysmal atrial fibrillation: Secondary | ICD-10-CM | POA: Diagnosis present

## 2020-04-09 DIAGNOSIS — R4781 Slurred speech: Secondary | ICD-10-CM | POA: Diagnosis present

## 2020-04-09 DIAGNOSIS — E039 Hypothyroidism, unspecified: Secondary | ICD-10-CM | POA: Diagnosis present

## 2020-04-09 DIAGNOSIS — I161 Hypertensive emergency: Secondary | ICD-10-CM | POA: Diagnosis present

## 2020-04-09 DIAGNOSIS — I2510 Atherosclerotic heart disease of native coronary artery without angina pectoris: Secondary | ICD-10-CM | POA: Diagnosis present

## 2020-04-09 DIAGNOSIS — G936 Cerebral edema: Secondary | ICD-10-CM | POA: Diagnosis not present

## 2020-04-09 DIAGNOSIS — I340 Nonrheumatic mitral (valve) insufficiency: Secondary | ICD-10-CM | POA: Diagnosis not present

## 2020-04-09 DIAGNOSIS — Z9221 Personal history of antineoplastic chemotherapy: Secondary | ICD-10-CM

## 2020-04-09 LAB — I-STAT CHEM 8, ED
BUN: 26 mg/dL — ABNORMAL HIGH (ref 8–23)
Calcium, Ion: 1.12 mmol/L — ABNORMAL LOW (ref 1.15–1.40)
Chloride: 100 mmol/L (ref 98–111)
Creatinine, Ser: 1.1 mg/dL — ABNORMAL HIGH (ref 0.44–1.00)
Glucose, Bld: 105 mg/dL — ABNORMAL HIGH (ref 70–99)
HCT: 33 % — ABNORMAL LOW (ref 36.0–46.0)
Hemoglobin: 11.2 g/dL — ABNORMAL LOW (ref 12.0–15.0)
Potassium: 4.1 mmol/L (ref 3.5–5.1)
Sodium: 138 mmol/L (ref 135–145)
TCO2: 30 mmol/L (ref 22–32)

## 2020-04-09 LAB — COMPREHENSIVE METABOLIC PANEL
ALT: 14 U/L (ref 0–44)
AST: 24 U/L (ref 15–41)
Albumin: 4 g/dL (ref 3.5–5.0)
Alkaline Phosphatase: 72 U/L (ref 38–126)
Anion gap: 11 (ref 5–15)
BUN: 26 mg/dL — ABNORMAL HIGH (ref 8–23)
CO2: 25 mmol/L (ref 22–32)
Calcium: 9.4 mg/dL (ref 8.9–10.3)
Chloride: 101 mmol/L (ref 98–111)
Creatinine, Ser: 1.24 mg/dL — ABNORMAL HIGH (ref 0.44–1.00)
GFR calc Af Amer: 49 mL/min — ABNORMAL LOW (ref >60)
GFR calc non Af Amer: 42 mL/min — ABNORMAL LOW (ref >60)
Glucose, Bld: 115 mg/dL — ABNORMAL HIGH (ref 70–99)
Potassium: 4.1 mmol/L (ref 3.5–5.1)
Sodium: 137 mmol/L (ref 135–145)
Total Bilirubin: 0.5 mg/dL (ref 0.3–1.2)
Total Protein: 6.3 g/dL — ABNORMAL LOW (ref 6.5–8.1)

## 2020-04-09 LAB — DIFFERENTIAL
Abs Immature Granulocytes: 0.01 10*3/uL (ref 0.00–0.07)
Basophils Absolute: 0 10*3/uL (ref 0.0–0.1)
Basophils Relative: 0 %
Eosinophils Absolute: 0 10*3/uL (ref 0.0–0.5)
Eosinophils Relative: 0 %
Immature Granulocytes: 0 %
Lymphocytes Relative: 28 %
Lymphs Abs: 1.9 10*3/uL (ref 0.7–4.0)
Monocytes Absolute: 0.7 10*3/uL (ref 0.1–1.0)
Monocytes Relative: 11 %
Neutro Abs: 4 10*3/uL (ref 1.7–7.7)
Neutrophils Relative %: 61 %

## 2020-04-09 LAB — CBC
HCT: 34.9 % — ABNORMAL LOW (ref 36.0–46.0)
Hemoglobin: 11.2 g/dL — ABNORMAL LOW (ref 12.0–15.0)
MCH: 32.7 pg (ref 26.0–34.0)
MCHC: 32.1 g/dL (ref 30.0–36.0)
MCV: 101.7 fL — ABNORMAL HIGH (ref 80.0–100.0)
Platelets: 170 10*3/uL (ref 150–400)
RBC: 3.43 MIL/uL — ABNORMAL LOW (ref 3.87–5.11)
RDW: 13.7 % (ref 11.5–15.5)
WBC: 6.6 10*3/uL (ref 4.0–10.5)
nRBC: 0 % (ref 0.0–0.2)

## 2020-04-09 LAB — RAPID URINE DRUG SCREEN, HOSP PERFORMED
Amphetamines: NOT DETECTED
Barbiturates: NOT DETECTED
Benzodiazepines: NOT DETECTED
Cocaine: NOT DETECTED
Opiates: NOT DETECTED
Tetrahydrocannabinol: NOT DETECTED

## 2020-04-09 LAB — URINALYSIS, ROUTINE W REFLEX MICROSCOPIC
Bilirubin Urine: NEGATIVE
Glucose, UA: NEGATIVE mg/dL
Hgb urine dipstick: NEGATIVE
Ketones, ur: NEGATIVE mg/dL
Leukocytes,Ua: NEGATIVE
Nitrite: NEGATIVE
Protein, ur: NEGATIVE mg/dL
Specific Gravity, Urine: 1.008 (ref 1.005–1.030)
pH: 7 (ref 5.0–8.0)

## 2020-04-09 LAB — APTT: aPTT: 34 seconds (ref 24–36)

## 2020-04-09 LAB — PROTIME-INR
INR: 1 (ref 0.8–1.2)
Prothrombin Time: 12.6 seconds (ref 11.4–15.2)

## 2020-04-09 LAB — ETHANOL: Alcohol, Ethyl (B): <10 mg/dL (ref <10)

## 2020-04-09 LAB — SARS CORONAVIRUS 2 BY RT PCR (HOSPITAL ORDER, PERFORMED IN ~~LOC~~ HOSPITAL LAB): SARS Coronavirus 2: NEGATIVE

## 2020-04-09 LAB — MRSA PCR SCREENING: MRSA by PCR: NEGATIVE

## 2020-04-09 LAB — CBG MONITORING, ED: Glucose-Capillary: 99 mg/dL (ref 70–99)

## 2020-04-09 MED ORDER — ACETAMINOPHEN 325 MG PO TABS
650.0000 mg | ORAL_TABLET | ORAL | Status: DC | PRN
  Administered 2020-04-11: 650 mg via ORAL
  Filled 2020-04-09: qty 2

## 2020-04-09 MED ORDER — SENNOSIDES-DOCUSATE SODIUM 8.6-50 MG PO TABS
1.0000 | ORAL_TABLET | Freq: Two times a day (BID) | ORAL | Status: DC
  Administered 2020-04-10 – 2020-04-11 (×2): 1 via ORAL
  Filled 2020-04-09 (×2): qty 1

## 2020-04-09 MED ORDER — ACETAMINOPHEN 650 MG RE SUPP: 650.0000 mg | RECTAL | Status: DC | PRN

## 2020-04-09 MED ORDER — PANTOPRAZOLE SODIUM 40 MG IV SOLR
40.0000 mg | Freq: Every day | INTRAVENOUS | Status: DC
  Administered 2020-04-10: 40 mg via INTRAVENOUS
  Filled 2020-04-09: qty 40

## 2020-04-09 MED ORDER — CHLORHEXIDINE GLUCONATE CLOTH 2 % EX PADS
6.0000 | MEDICATED_PAD | Freq: Every day | CUTANEOUS | Status: DC
  Administered 2020-04-09 – 2020-04-10 (×2): 6 via TOPICAL

## 2020-04-09 MED ORDER — STROKE: EARLY STAGES OF RECOVERY BOOK: Freq: Once | Status: DC

## 2020-04-09 MED ORDER — CLEVIDIPINE BUTYRATE 0.5 MG/ML IV EMUL
INTRAVENOUS | Status: AC
  Administered 2020-04-09: 50 mg via INTRAVENOUS
  Filled 2020-04-09: qty 50

## 2020-04-09 MED ORDER — CLEVIDIPINE BUTYRATE 0.5 MG/ML IV EMUL: 0.0000 mg/h | INTRAVENOUS | Status: DC

## 2020-04-09 MED ORDER — ACETAMINOPHEN 160 MG/5ML PO SOLN: 650.0000 mg | ORAL | Status: DC | PRN

## 2020-04-09 MED ORDER — CLEVIDIPINE BUTYRATE 0.5 MG/ML IV EMUL
0.0000 mg/h | INTRAVENOUS | Status: DC
  Administered 2020-04-09: 14 mg/h via INTRAVENOUS
  Administered 2020-04-10: 3 mg/h via INTRAVENOUS
  Administered 2020-04-10: 6 mg/h via INTRAVENOUS
  Administered 2020-04-11: 4 mg/h via INTRAVENOUS
  Administered 2020-04-11 (×2): 6 mg/h via INTRAVENOUS
  Filled 2020-04-09 (×7): qty 50

## 2020-04-09 NOTE — ED Notes (Signed)
Pharmacology and MD both stated to keep Pt at current rate of cleviprex. BP changed to every 5 min.

## 2020-04-09 NOTE — ED Notes (Signed)
Sent a urine culture with the urine specimen 

## 2020-04-09 NOTE — ED Provider Notes (Signed)
[facility] EMERGENCY DEPARTMENT Provider Note   CSN: [id number] Arrival date & time: 04/09/20  1934     History Chief Complaint  Patient presents with  . Code Stroke    [name] is a 79 y.o. female.  HPI Patient has medical history of hypertension hyperlipidemia paroxysmal atrial fibrillation not anticoagulated.  She has sudden onset of right-sided weakness and headache.  Speech was slurred.  Patient reports that she was getting some food ready and her husband suddenly observed her to be confused with slurred speech.  She did not have any nausea or vomiting.  EMS was called and first blood pressure was 199/111.  Patient is not typically hypertensive.    Past Medical History:  Diagnosis Date  . Arthritis    OSTEOARTHRITIS   -- CONSTANT PAIN RIGHT HIP---AND PAIN LEFT KNEE--PT STATES SHE GETS INJECTIONS INTO HER KNEE  . Complication of anesthesia    BLOOD PRESSURE DROPPED WITH NASAL SURGERY, ONE OF THE CARPAL TUNNEL REPAIRS AND DURING A COLONOSCOPY  . Dyslipidemia   . History of skin cancer   . Hypertension   . Hypothyroidism   . Menopausal symptoms   . NSTEMI (non-ST elevated myocardial infarction) ([place]) 12/09/15   Medical management: Distal branch of D1 95%, ostial D2 75%, distal LAD 50%. Tortuous arteries consistent with hypertension  . Ovarian cancer ([place]) 01/2013   Recurrence since 2014/2016  . PAF (paroxysmal atrial fibrillation) ([place])    On ELIQUIS; On Amiodarone (Eye Exam 12/25/15) - no longer on Rythmol  . Stroke [facility]) 2017   no deficits    Patient Active Problem List   Diagnosis Date Noted  . ICH (intracerebral hemorrhage) ([place]) 04/09/2020  . Acute GI bleeding 09/21/2019  . Symptomatic anemia 09/21/2019  . AF (paroxysmal atrial fibrillation) ([place])   . Vertigo 01/30/2019  . Chemotherapy-induced nausea 08/15/2018  . Pancytopenia, acquired ([place]) 05/02/2018  . Goals of care, counseling/discussion 04/03/2018  . Swan-neck deformity of  finger, right 02/01/2018  . Chronic right-sided low back pain without sciatica 08/29/2017  . Closed nondisplaced fracture of head of right radius 08/15/2017  . Osteoarthritis of finger of left hand 07/04/2017  . Pain in finger of left hand 07/04/2017  . Trigger ring finger of left hand 07/04/2017  . 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 Status post revision of total knee replacement, left 03/18/2017  . Thrombocytopenia ([place]) 03/27/2016  . High risk medications (not anticoagulants) long-term use 03/27/2016  . Chronic anticoagulation 03/27/2016  . Atherosclerotic heart disease of native coronary artery without angina pectoris 03/03/2016  . Bilateral hearing loss 02/16/2016  . Bilateral impacted cerumen 02/16/2016  . Rhinitis, chronic 02/16/2016  . Embolic stroke ([place]) 99991111  . Vaginal atrophy 12/29/2015  . On amiodarone therapy 12/23/2015  . Dyslipidemia, goal LDL below 70   . Stroke with cerebral ischemia ([place])   . Visual disturbance   . Cerebral thrombosis with cerebral infarction 12/08/2015  . NSTEMI (non-ST elevated myocardial infarction) ([place]) 12/04/2015  . 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 Chemotherapy-induced peripheral neuropathy ([place]) 07/23/2015  . Portacath in place 07/23/2015  . Recurrent carcinoma of ovary ([place]) 07/23/2015  . 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 Genetic testing 07/14/2015  . Encounter for antineoplastic chemotherapy 06/25/2015  . Port catheter in place 06/25/2015  . Antineoplastic chemotherapy induced pancytopenia ([place]) 06/02/2015  . Dehydration 06/02/2015  . 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 Chemotherapy induced neutropenia ([place]) 05/13/2015  . Chemotherapy induced thrombocytopenia 05/13/2015  . Hyperbilirubinemia 05/06/2015  . Osteoarthritis of left knee 01/03/2015  . Status post total left  knee replacement 01/03/2015  . PAF (paroxysmal atrial fibrillation) ([place]): Symptomatic - Rhythm control with Amiodarone. CHA2DS2Vasc Score 7: On Eliquis   . Fever 05/03/2013    Class: Acute  . Nausea 05/03/2013    Class: Acute  . Other constipation 05/03/2013    Class: Acute  . Syncope, history of 04/19/2013  . Essential hypertension 04/19/2013  . Hypothyroidism 04/19/2013  . Ovarian cancer on right ([place]) 03/24/2013  . Degenerative arthritis of hip 02/25/2012    Past Surgical History:  Procedure Laterality Date  . BILATERAL CARPAL TUNNEL REPAIR  2007  . BREAST BIOPSY Left 03/19/2014   benign  . CARDIAC CATHETERIZATION N/A 12/05/2015   Procedure: Left Heart Cath and Coronary Angiography;  Surgeon: Belva Crome, MD;  Location: [facility] CV [facility];  Service: Cardiovascular;  distal branch of D1 95%, ostial D2 75%, dLAD 50%,p-mRCA 40%. Tortuous vessels.  Marland Kitchen DILATION AND CURETTAGE OF UTERUS  1969  . ESOPHAGOGASTRODUODENOSCOPY (EGD) WITH PROPOFOL N/A 09/22/2019   Procedure: ESOPHAGOGASTRODUODENOSCOPY (EGD) WITH PROPOFOL;  Surgeon: Milus Banister, MD;  Location: [facility];  Service: Endoscopy;  Laterality: N/A;  . JOINT REPLACEMENT    . LAPAROTOMY Bilateral 02/13/2013   Procedure: EXPLORATORY LAPAROTOMY TOTAL ABDOMINAL HYSTERECTOMY BILATERAL SALPINGO-OOPHORECTOMY, Partial Rectal Resection with Reanastamosis;  Surgeon: Alvino Chapel, MD;  Location: [facility];  Service: Gynecology;  Laterality: Bilateral;  . LEFT KNEE ARTHROSCOPY   2011  . LYMPHADENECTOMY Right 02/13/2013   Procedure: PEVLIC  LYMPHADENECTOMY, DEBULKING right pelvic tumor nodules;  Surgeon: Alvino Chapel, MD;  Location: [facility];  Service: Gynecology;  Laterality: Right;  . NM MYOVIEW LTD  June 2010    subbmaximal with no ischemia or infarction.  . OMENTECTOMY  02/13/2013   Procedure: OMENTECTOMY;  Surgeon: Alvino Chapel, MD;  Location: [facility];  Service: Gynecology;;  . [place]  . SURGERY FOR RUPTURED OVARIAN CYST  1969  . SYNOVECTOMY WITH POLY EXCHANGE Left 03/18/2017   Procedure: LEFT KNEE PARTIAL SYNOVECTOMY WITH POLY EXCHANGE;  Surgeon: Mcarthur Rossetti, MD;  Location: [facility];  Service: Orthopedics;  Laterality: Left;  Adductor Block  . TAH/BSO/Tumor debulking with right pelvic LND  01/2013  . TONSILLECTOMY  1962  . TOTAL HIP ARTHROPLASTY  02/25/2012   Procedure: TOTAL HIP ARTHROPLASTY ANTERIOR APPROACH;  Surgeon: Mcarthur Rossetti, MD;  Location: [facility];  Service: Orthopedics;  Laterality: Right;  . TOTAL KNEE ARTHROPLASTY Left 01/03/2015   Procedure: LEFT TOTAL KNEE ARTHROPLASTY;  Surgeon: Mcarthur Rossetti, MD;  Location: [facility];  Service: Orthopedics;  Laterality: Left;  . TRANSTHORACIC ECHOCARDIOGRAM  May 2014; January 2016   a. Normal LV size function. EF 60-65%. Grade 1 diastolic function. Mild MR and mildly elevated PA pressures of 37 mmHg per;; b. EF 60-65%. Mobile echodensity 10 mm x 6 mm attest interventricular septum is questionable fibroblastoma.  Marland Kitchen TRIGGER FINGER RELEASE Left 12/20/2017   Procedure: RELEASE TRIGGER FINGER/A-1 PULLEY LEFT RING FINGER;  Surgeon: Daryll Brod, MD;  Location: [facility];  Service: Orthopedics;  Laterality: Left;     OB History    Gravida  3   Para  2   Term      Preterm      AB  1   Living  3  SAB  1   TAB      Ectopic      Multiple  1   Live Births              Family History  Problem Relation Age of Onset  . Hypertension Mother   . Basal cell carcinoma Mother   . AAA (abdominal aortic aneurysm) Father   . Heart Problems Maternal Uncle   . Heart Problems Maternal Grandfather   . AAA (abdominal aortic aneurysm) Paternal Grandmother   . Prostate cancer Maternal Uncle 70  . Prostate cancer Other   . Other Daughter        one daughter had TAH-BSO at 83; other daughter will have one soon    Social History   Tobacco Use  . Smoking status: Never Smoker  .  Smokeless tobacco: Never Used  Substance Use Topics  . Alcohol use: No    Alcohol/week: 0.0 standard drinks  . Drug use: No    Home Medications Prior to Admission medications   Medication Sig Start Date End Date Taking? Authorizing Provider  acetaminophen (TYLENOL) 500 MG tablet Take 1,000 mg by mouth every 6 (six) hours as needed for mild pain.     [provider]  amiodarone (PACERONE) 200 MG tablet Take 0.5 tablets (100 mg total) by mouth daily. If breakthrough afib. epsiode may take 400 mg twice a day until resolves then follow instructions 12/05/19   Leonie Man, MD  Biotin 10 MG TABS Take 20 mg by mouth daily.    [provider]  co-enzyme Q-10 30 MG capsule Take 100 mg by mouth daily.     [provider]  desonide (DESOWEN) 0.05 % cream Apply 1 application topically 2 (two) times daily.  12/23/14   [provider]  diphenhydrAMINE (BENADRYL) 25 MG tablet Take 50 mg by mouth at bedtime as needed for sleep.     [provider]  ezetimibe (ZETIA) 10 MG tablet Take 1 tablet (10 mg total) by mouth daily. Patient taking differently: Take 10 mg by mouth at bedtime.  02/24/16   Leonie Man, MD  Glucosamine HCl 1000 MG TABS Take 1,000 mg by mouth daily.    [provider]  hydrocortisone 1 % ointment Apply 1 application topically 2 (two) times daily as needed for itching (eczema).     [provider]  ipratropium (ATROVENT) 0.06 % nasal spray Place 2 sprays into both nostrils 2 (two) times daily.  02/16/16   [provider]  lidocaine-prilocaine (EMLA) cream APPLY TO PORTA-CATH SITE 1-2 HOURS PRIOR TO ACCESS AS DIRECTED. Patient taking differently: Apply 1 application topically as needed. Apply to [facility] site 1-2 hours prior to access as directed. 08/02/19   Joylene John D, NP  Melatonin 5 MG CAPS Take 5 mg by mouth at bedtime as needed (sleep). Reported on 12/25/2015    [provider]  Menthol, Topical  Analgesic, (ICY HOT EX) Apply 1 application topically 2 (two) times daily as needed (PAIN).     [provider]  methylPREDNISolone (MEDROL) 4 MG tablet Medrol dose pack. Take as instructed 11/26/19   Mcarthur Rossetti, MD  Multiple Vitamin (MULTIVITAMIN WITH MINERALS) TABS tablet Take 1 tablet by mouth daily. Centrum Silver    [provider]  ofloxacin (OCUFLOX) 0.3 % ophthalmic solution Place 1 drop into the left eye 4 (four) times daily. 12/19/19   [provider]  ondansetron (ZOFRAN) 8 MG tablet TAKE 1 TABLET (8  MG TOTAL) BY MOUTH EVERY 8 (EIGHT) HOURS AS NEEDED FOR NAUSEA OR VOMITING. Patient taking differently: Take 8 mg by mouth daily as needed for nausea or vomiting.  12/11/18   Heath Lark, MD  oxyCODONE (OXY IR/ROXICODONE) 5 MG immediate release tablet Take 5 mg by mouth 2 (two) times daily as needed. 09/25/19   [provider]  Polyethyl Glycol-Propyl Glycol (SYSTANE OP) Place 1 drop into both eyes 2 (two) times daily.     [provider]  polyethylene glycol (MIRALAX / GLYCOLAX) packet Take 17 g by mouth daily. Mix in 8 oz liquid and drink    [provider]  potassium chloride (K-DUR) 10 MEQ tablet Take 10 mEq by mouth daily. 03/05/19   [provider]  prednisoLONE acetate (PRED FORTE) 1 % ophthalmic suspension Place 1 drop into the left eye 4 (four) times daily. 12/19/19   [provider]  prochlorperazine (COMPAZINE) 10 MG tablet TAKE 1 TABLET (10 MG TOTAL) BY MOUTH EVERY 6 (SIX) HOURS AS NEEDED (NAUSEA OR VOMITING). Patient taking differently: Take 10 mg by mouth daily as needed (Nausea or vomiting).  03/05/19   Heath Lark, MD  SENEXON-S 8.6-50 MG tablet Take 1 tablet by mouth daily as needed for mild constipation.  08/16/19   [provider]  SYNTHROID 88 MCG tablet Take 88 mcg by mouth daily. 05/06/19   [provider]    Allergies    Clonidine derivatives, Coreg [carvedilol], Augmentin  [amoxicillin-pot clavulanate], Caffeine, Codeine, Doxycycline, Flexeril [cyclobenzaprine], Lipitor [atorvastatin], Lisinopril, Pravastatin, Tape, Tegaderm ag mesh [silver], and Ultram [tramadol]  Review of Systems   Review of Systems Level 5 caveat cannot obtain review of systems due to patient condition and confusion with acute stroke. Physical Exam Updated Vital Signs BP (!) 174/86 (BP Location: Right Arm)   Pulse 71   Temp 98.5 F (36.9 C) (Oral)   Resp 19   Ht 5\' 3"  (1.6 m)   Wt 60.7 kg   SpO2 100%   BMI 23.71 kg/m   Physical Exam Constitutional:      Comments: Patient awake and alert.  She is answering questions but seems slightly delayed.  No respiratory distress.  Airway widely patent.  HENT:     Head: Normocephalic and atraumatic.     Nose: Nose normal.     Mouth/Throat:     Mouth: Mucous membranes are moist.     Pharynx: Oropharynx is clear.  Eyes:     Extraocular Movements: Extraocular movements intact.     Conjunctiva/sclera: Conjunctivae normal.     Pupils: Pupils are equal, round, and reactive to light.  Cardiovascular:     Rate and Rhythm: Normal rate and regular rhythm.     Pulses: Normal pulses.  Pulmonary:     Effort: Pulmonary effort is normal.     Breath sounds: Normal breath sounds.  Abdominal:     General: There is no distension.     Palpations: Abdomen is soft.     Tenderness: There is no abdominal tenderness. There is no guarding.  Musculoskeletal:        General: No swelling or tenderness.     Cervical back: Neck supple.     Right lower leg: No edema.     Left lower leg: No edema.  Skin:    General: Skin is warm and dry.  Neurological:     Comments: Patient is awake.  No somnolence.  Her speech is intelligible but seems slightly delayed.  Content is situationally  appropriate.  Patient appears to have slight right facial droop.  Tongue is midline.  Right upper extremity slightly weak compared to the left.  Patient has grip strength 4\5 on the  right 5\5 on the left.  She can elevate the right upper extremity but slightly slower than the left.  He endorses sensory differential to light touch with right being decreased.  Patient can elevate both right and left lower extremity off the bed independently and hold.  Endorses decreased sensation to touch on the right.  Psychiatric:        Mood and Affect: Mood normal.     ED Results / Procedures / Treatments   Labs (all labs ordered are listed, but only abnormal results are displayed) Labs Reviewed  CBC - Abnormal; Notable for the following components:      Result Value   RBC 3.43 (*)    Hemoglobin 11.2 (*)    HCT 34.9 (*)    MCV 101.7 (*)    All other components within normal limits  COMPREHENSIVE METABOLIC PANEL - Abnormal; Notable for the following components:   Glucose, Bld 115 (*)    BUN 26 (*)    Creatinine, Ser 1.24 (*)    Total Protein 6.3 (*)    GFR calc non Af Amer 42 (*)    GFR calc Af Amer 49 (*)    All other components within normal limits  I-STAT CHEM 8, ED - Abnormal; Notable for the following components:   BUN 26 (*)    Creatinine, Ser 1.10 (*)    Glucose, Bld 105 (*)    Calcium, Ion 1.12 (*)    Hemoglobin 11.2 (*)    HCT 33.0 (*)    All other components within normal limits  PROTIME-INR  DIFFERENTIAL  ETHANOL  APTT  RAPID URINE DRUG SCREEN, HOSP PERFORMED  URINALYSIS, ROUTINE W REFLEX MICROSCOPIC  CBG MONITORING, ED    EKG EKG Interpretation  Date/Time:  Wednesday Apr 09 2020 20:02:57 EDT Ventricular Rate:  71 PR Interval:    QRS Duration: 102 QT Interval:  421 QTC Calculation: 458 R Axis:   35 Text Interpretation: Sinus rhythm Low voltage, precordial leads Borderline T abnormalities, anterior leads No significant change since 05/13/2019 Confirmed by Veryl Speak [phone]) on 04/10/2020 10:25:15 PM   Radiology US Guided Needle Placement - No Linked Charges  Result Date: 04/08/2020 Ultrasound-guided left hip injection: After sterile prep with  Betadine, injected 8 cc 1% lidocaine without epinephrine and 40 mg methylprednisolone using a 22-gauge spinal needle, passing the needle through the iliofemoral ligament into the femoral head/neck junction.  Injectate was seen filling the joint capsule.   CT HEAD CODE STROKE WO CONTRAST  Result Date: 04/09/2020 CLINICAL DATA:  Code stroke. Neuro deficit, acute, stroke suspected. Additional history provided: Last known normal 18:00. Left-sided weakness/slurred speech. EXAM: CT HEAD WITHOUT CONTRAST TECHNIQUE: Contiguous axial images were obtained from the base of the skull through the vertex without intravenous contrast. COMPARISON:  Brain MRI 12/07/2015, head CT 11/26/2015 FINDINGS: Brain: Parenchymal hemorrhage centered within the left thalamocapsular junction measuring 1.0 x 2.1 x 2.7 cm (AP x TV x CC) there is mild surrounding edema. No intraventricular extension of hemorrhage at this time. No significant ventricular effacement. No midline shift. Redemonstrated ill-defined hypoattenuation within the cerebral white matter is nonspecific, but consistent with chronic small vessel ischemic disease. Stable, mild generalized parenchymal atrophy. No demarcated cortical infarct. No extra-axial fluid collection. No evidence of intracranial mass. Vascular: No hyperdense vessel.  Atherosclerotic  calcifications. Skull: Normal. Negative for fracture or focal lesion. Sinuses/Orbits: Visualized orbits show no acute finding. No significant paranasal sinus disease or mastoid effusion at the imaged levels. These results were called by telephone at the time of interpretation on 04/09/2020 at 7:57 pm to provider Dr. Lorraine Lax, who verbally acknowledged these results. IMPRESSION: 1.0 x 2.1 x 2.7 cm acute parenchymal hemorrhage within the left thalamocapsular junction. This is a characteristic location for a hypertensive hemorrhage. Mild surrounding edema. No intraventricular extension of hemorrhage or significant mass effect at this  time. Background generalized parenchymal atrophy and chronic small vessel ischemic disease. Electronically Signed   By: Kellie Simmering DO   On: 04/09/2020 19:52    Procedures Procedures (including critical care time) CRITICAL CARE Performed by: Charlesetta Shanks   Total critical care time: 30 minutes  Critical care time was exclusive of separately billable procedures and treating other patients.  Critical care was necessary to treat or prevent imminent or life-threatening deterioration.  Critical care was time spent personally by me on the following activities: development of treatment plan with patient and/or surrogate as well as nursing, discussions with consultants, evaluation of patient's response to treatment, examination of patient, obtaining history from patient or surrogate, ordering and performing treatments and interventions, ordering and review of laboratory studies, ordering and review of radiographic studies, pulse oximetry and re-evaluation of patient's condition. Medications Ordered in ED Medications   stroke: mapping our early stages of recovery book (has no administration in time range)  acetaminophen (TYLENOL) tablet 650 mg (has no administration in time range)    Or  acetaminophen (TYLENOL) 160 MG/5ML solution 650 mg (has no administration in time range)    Or  acetaminophen (TYLENOL) suppository 650 mg (has no administration in time range)  senna-docusate (Senokot-S) tablet 1 tablet (has no administration in time range)  pantoprazole (PROTONIX) injection 40 mg (has no administration in time range)  clevidipine (CLEVIPREX) infusion 0.5 mg/mL (8 mg/hr Intravenous Rate/Dose Change 04/09/20 2011)  clevidipine (CLEVIPREX) 0.5 MG/ML infusion (50 mg  New Bag/Given 04/09/20 1956)    ED Course  I have reviewed the triage vital signs and the nursing notes.  Pertinent labs & imaging results that were available during my care of the patient were reviewed by me and considered in my  medical decision making (see chart for details).    MDM Rules/Calculators/A&P                      Patient presents as outlined.  She presents as a code stroke and immediately evaluated by neurology.  Symptoms were of acute onset.  CT identifies intraparenchymal hemorrhage.  Patient was very hypertensive on arrival.  She otherwise had been well leading up to this.  Patient was in the process of preparing some dinner.  She is alert without somnolence or airways compromise.  Dr. Lorraine Lax managing patient as code stroke on arrival and has ordered Cleviprex for hypertensive emergency.  Cleviprex infusing.  Patient is tolerating this.  No need for airway management at this time.  We will continue to observe for any deterioration. Final Clinical Impression(s) / ED Diagnoses Final diagnoses:  Nontraumatic intracerebral hemorrhage, unspecified cerebral location, unspecified laterality [facility])  Hypertensive emergency    Rx / DC Orders ED Discharge Orders    None       Charlesetta Shanks, MD 04/11/20 1850

## 2020-04-09 NOTE — Code Documentation (Signed)
Responded to Code Stroke called at 1917 for R sided weakness, slurred speech, and headache , LSN -1800. Pt arrived at 1934, CBG-99 , NIH-4 for R facial droop, R sided drift, and R sensory deficit. CT Head-acute parenchymal hemorrhage within the L thalamocapsular junction. SBP-170>cleviprex gtt started at 2001 with orders to keep SBP <140.Marland Kitchen Plan to admit to Neuro ICU.

## 2020-04-09 NOTE — ED Triage Notes (Signed)
Code stroke called by [facility]. LNW 1800. Pt became weak and fell into arms of her husband. No LOC, Pt denies nausea/vomit.EMS vitals 199/11 and then 174/86.

## 2020-04-10 ENCOUNTER — Inpatient Hospital Stay (HOSPITAL_COMMUNITY)

## 2020-04-10 DIAGNOSIS — G936 Cerebral edema: Secondary | ICD-10-CM

## 2020-04-10 DIAGNOSIS — I361 Nonrheumatic tricuspid (valve) insufficiency: Secondary | ICD-10-CM

## 2020-04-10 DIAGNOSIS — I340 Nonrheumatic mitral (valve) insufficiency: Secondary | ICD-10-CM

## 2020-04-10 LAB — LIPID PANEL
Cholesterol: 215 mg/dL — ABNORMAL HIGH (ref 0–200)
HDL: 70 mg/dL (ref >40)
LDL Cholesterol: 129 mg/dL — ABNORMAL HIGH (ref 0–99)
Total CHOL/HDL Ratio: 3.1 RATIO
Triglycerides: 79 mg/dL (ref <150)
VLDL: 16 mg/dL (ref 0–40)

## 2020-04-10 LAB — HEMOGLOBIN A1C
Hgb A1c MFr Bld: 5.2 % (ref 4.8–5.6)
Mean Plasma Glucose: 102.54 mg/dL

## 2020-04-10 LAB — ECHOCARDIOGRAM COMPLETE
Height: 63 in
Weight: 2130.53 oz

## 2020-04-10 MED ORDER — POLYETHYLENE GLYCOL 3350 17 G PO PACK
17.0000 g | PACK | Freq: Every day | ORAL | Status: DC
  Filled 2020-04-10: qty 1

## 2020-04-10 MED ORDER — POTASSIUM CHLORIDE ER 10 MEQ PO TBCR
10.0000 meq | EXTENDED_RELEASE_TABLET | Freq: Every day | ORAL | Status: DC
  Administered 2020-04-10 – 2020-04-11 (×2): 10 meq via ORAL
  Filled 2020-04-10 (×4): qty 1

## 2020-04-10 MED ORDER — POLYETHYL GLYCOL-PROPYL GLYCOL 0.4-0.3 % OP GEL: Freq: Two times a day (BID) | OPHTHALMIC | Status: DC

## 2020-04-10 MED ORDER — ADULT MULTIVITAMIN W/MINERALS CH
1.0000 | ORAL_TABLET | Freq: Every day | ORAL | Status: DC
  Administered 2020-04-10 – 2020-04-11 (×2): 1 via ORAL
  Filled 2020-04-10 (×2): qty 1

## 2020-04-10 MED ORDER — AMIODARONE HCL 200 MG PO TABS
100.0000 mg | ORAL_TABLET | Freq: Every day | ORAL | Status: DC
  Administered 2020-04-10 – 2020-04-11 (×2): 100 mg via ORAL
  Filled 2020-04-10 (×2): qty 1

## 2020-04-10 MED ORDER — EZETIMIBE 10 MG PO TABS: 10.0000 mg | ORAL_TABLET | Freq: Every day | ORAL | Status: DC

## 2020-04-10 MED ORDER — GADOBUTROL 1 MMOL/ML IV SOLN
5.0000 mL | Freq: Once | INTRAVENOUS | Status: AC | PRN
  Administered 2020-04-10: 7.5 mL via INTRAVENOUS

## 2020-04-10 MED ORDER — IPRATROPIUM BROMIDE 0.06 % NA SOLN
2.0000 | Freq: Two times a day (BID) | NASAL | Status: DC
  Administered 2020-04-11: 2 via NASAL
  Filled 2020-04-10: qty 15

## 2020-04-10 MED ORDER — OFLOXACIN 0.3 % OP SOLN
1.0000 [drp] | Freq: Four times a day (QID) | OPHTHALMIC | Status: DC
  Administered 2020-04-10 (×2): 1 [drp] via OPHTHALMIC
  Filled 2020-04-10: qty 5

## 2020-04-10 MED ORDER — HYDROCHLOROTHIAZIDE 12.5 MG PO CAPS
12.5000 mg | ORAL_CAPSULE | Freq: Every day | ORAL | Status: DC
  Administered 2020-04-10 – 2020-04-11 (×2): 12.5 mg via ORAL
  Filled 2020-04-10 (×2): qty 1

## 2020-04-10 MED ORDER — LEVOTHYROXINE SODIUM 88 MCG PO TABS
88.0000 ug | ORAL_TABLET | Freq: Every day | ORAL | Status: DC
  Administered 2020-04-10 – 2020-04-11 (×2): 88 ug via ORAL
  Filled 2020-04-10 (×2): qty 1

## 2020-04-10 MED ORDER — AMLODIPINE BESYLATE 5 MG PO TABS
5.0000 mg | ORAL_TABLET | Freq: Every day | ORAL | Status: DC
  Administered 2020-04-10 – 2020-04-11 (×2): 5 mg via ORAL
  Filled 2020-04-10 (×2): qty 1

## 2020-04-10 MED ORDER — POLYVINYL ALCOHOL 1.4 % OP SOLN
1.0000 [drp] | Freq: Two times a day (BID) | OPHTHALMIC | Status: DC
  Administered 2020-04-10 – 2020-04-11 (×2): 1 [drp] via OPHTHALMIC
  Filled 2020-04-10 (×2): qty 15

## 2020-04-10 MED ORDER — HYDROCHLOROTHIAZIDE 25 MG PO TABS: 25.0000 mg | ORAL_TABLET | Freq: Every day | ORAL | Status: DC

## 2020-04-10 MED ORDER — PREDNISOLONE ACETATE 1 % OP SUSP
1.0000 [drp] | Freq: Four times a day (QID) | OPHTHALMIC | Status: DC
  Administered 2020-04-10 (×2): 1 [drp] via OPHTHALMIC
  Filled 2020-04-10: qty 5

## 2020-04-10 MED ORDER — PANTOPRAZOLE SODIUM 40 MG PO TBEC
40.0000 mg | DELAYED_RELEASE_TABLET | Freq: Every day | ORAL | Status: DC
  Administered 2020-04-11: 40 mg via ORAL
  Filled 2020-04-10: qty 1

## 2020-04-10 NOTE — Progress Notes (Signed)
OT Cancellation Note  Patient Details Name: [name] MRN: [id number] DOB: 09-07-1941   Cancelled Treatment:    Reason Eval/Treat Not Completed: Active bedrest order  Lenward Chancellor 04/10/2020, 8:46 AM

## 2020-04-10 NOTE — Progress Notes (Signed)
STROKE TEAM PROGRESS NOTE   INTERVAL HISTORY Patient husband is at the bedside.  I personally reviewed history of presenting illness with the patient, electronic medical records and imaging films in PACS. She presented with sudden onset of right-sided weakness and headache and slurred speech last evening but has shown some improvement overnight.  She feels she is almost back to baseline.  Blood pressure is only slightly elevated in the [id number] systolic range and she was briefly off of the Cleviprex drip this a.m. but was just restarted during rounds.  She has known history of recurrent metastatic endometrial cancer and was made hospice care a few months ago.  She also has A. fib and is not on anticoagulation. Vitals:   04/10/20 0615 04/10/20 0630 04/10/20 0700 04/10/20 0800  BP: 122/66 121/62 128/73 128/73  Pulse: 60 (!) 59 65 66  Resp: 13 12 13 12   Temp:    98.4 F (36.9 C)  TempSrc:    Oral  SpO2: 99% 97% 96% 98%  Weight:      Height:        CBC:  Recent Labs  Lab 04/09/20 1939 04/09/20 1940  WBC 6.6  --   NEUTROABS 4.0  --   HGB 11.2* 11.2*  HCT 34.9* 33.0*  MCV 101.7*  --   PLT 170  --     Basic Metabolic Panel:  Recent Labs  Lab 04/09/20 1939 04/09/20 1940  NA 137 138  K 4.1 4.1  CL 101 100  CO2 25  --   GLUCOSE 115* 105*  BUN 26* 26*  CREATININE 1.24* 1.10*  CALCIUM 9.4  --    Lipid Panel:     Component Value Date/Time   CHOL 155 10/03/2018 0922   TRIG 80 10/03/2018 0922   HDL 84 10/03/2018 0922   CHOLHDL 1.8 10/03/2018 0922   CHOLHDL 3.3 12/04/2015 0823   VLDL 14 12/04/2015 0823   LDLCALC 55 10/03/2018 0922   HgbA1c:  Lab Results  Component Value Date   HGBA1C 4.9 12/08/2015   Urine Drug Screen:     Component Value Date/Time   LABOPIA NONE DETECTED 04/09/2020 1937   COCAINSCRNUR NONE DETECTED 04/09/2020 1937   LABBENZ NONE DETECTED 04/09/2020 1937   AMPHETMU NONE DETECTED 04/09/2020 1937   THCU NONE DETECTED 04/09/2020 1937   LABBARB NONE  DETECTED 04/09/2020 1937    Alcohol Level     Component Value Date/Time   ETH <10 04/09/2020 1939    IMAGING past 24 hours CT HEAD CODE STROKE WO CONTRAST  Result Date: 04/09/2020 CLINICAL DATA:  Code stroke. Neuro deficit, acute, stroke suspected. Additional history provided: Last known normal 18:00. Left-sided weakness/slurred speech. EXAM: CT HEAD WITHOUT CONTRAST TECHNIQUE: Contiguous axial images were obtained from the base of the skull through the vertex without intravenous contrast. COMPARISON:  Brain MRI 12/07/2015, head CT 11/26/2015 FINDINGS: Brain: Parenchymal hemorrhage centered within the left thalamocapsular junction measuring 1.0 x 2.1 x 2.7 cm (AP x TV x CC) there is mild surrounding edema. No intraventricular extension of hemorrhage at this time. No significant ventricular effacement. No midline shift. Redemonstrated ill-defined hypoattenuation within the cerebral white matter is nonspecific, but consistent with chronic small vessel ischemic disease. Stable, mild generalized parenchymal atrophy. No demarcated cortical infarct. No extra-axial fluid collection. No evidence of intracranial mass. Vascular: No hyperdense vessel.  Atherosclerotic calcifications. Skull: Normal. Negative for fracture or focal lesion. Sinuses/Orbits: Visualized orbits show no acute finding. No significant paranasal sinus disease or mastoid effusion at the  imaged levels. These results were called by telephone at the time of interpretation on 04/09/2020 at 7:57 pm to provider Dr. Lorraine Lax, who verbally acknowledged these results. IMPRESSION: 1.0 x 2.1 x 2.7 cm acute parenchymal hemorrhage within the left thalamocapsular junction. This is a characteristic location for a hypertensive hemorrhage. Mild surrounding edema. No intraventricular extension of hemorrhage or significant mass effect at this time. Background generalized parenchymal atrophy and chronic small vessel ischemic disease. Electronically Signed   By: Kellie Simmering DO   On: 04/09/2020 19:52    PHYSICAL EXAM Pleasant elderly Caucasian lady not in distress. . Afebrile. Head is nontraumatic. Neck is supple without bruit.    Cardiac exam no murmur or gallop. Lungs are clear to auscultation. Distal pulses are well felt. Neurological Exam ;  Awake  Alert oriented x 3.  Slightly hesitant speech with only occasional word finding difficulty and language.eye movements full without nystagmus.fundi were not visualized. Vision acuity and fields appear normal. Hearing is normal. Palatal movements are normal. Face asymmetric with slight flattening of the right nasolabial fold.. Tongue midline. Normal strength, tone, reflexes and coordination.  But diminished fine finger movements on the right and orbits left over right upper extremity.  Normal sensation. Gait deferred.  ASSESSMENT/PLAN Ms. DANAMARIE KUECHLER is a 79 y.o. female with history of metastatic endometrial cancer currently under Hospice care, hypertension, hyperlipidemia, hypothyroidism, paroxysmal atrial fibrillation not on any anticoagulation presenting with R sided weakness and HA.   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 mild edema. Small vessel disease. Atrophy.   MRI w/w/o L thalamic hemorrhage w/ mild edema. No mets seen  MRA  pending   2D Echo pending   LDL pending   HgbA1c pending   SCDs for VTE prophylaxis  No antithrombotic prior to admission, now on No antithrombotic given ICH  Therapy recommendations:  pending - OOB once MRI done/cleared  Disposition:  pending   Hypertensive Emergency  Home meds:  None listed  BP as high as 178/100   Initially treated with IV CLeviprex, off since 0400  Still > 160 this am  SBP goal < 140 . Resume Cleviprex.  . Add norvasc 5 and HCTZ 12.5 . 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   Coronary artery disease, NSTEMI w/ cath 11/2015  Other Active Problems  Hypothyroidism   Hospital day # 1 Patient presented with left thalamic hemorrhage likely hypertensive etiology but given history of metastatic endometrial carcinoma need to rule out brain mets also history of A. fib and need to rule out hemorrhagic infarct 2.  Check MRI scan of the brain with MRI of the brain echocardiogram lipid profile hemoglobin A1c.  Strict blood pressure control and close  neurological monitoring as per intracerebral hemorrhage protocol.  Start oral blood pressure medications and use as needed IV labetalol and hydralazine and wean off Cleviprex drip.  Keep systolic blood pressure below 140 for 24 hours and then below 160.  Mobilize out of bed.  Therapy consults.  Long discussion with the patient and husband and answered questions. This patient is critically ill and at significant risk of neurological worsening, death and care requires constant monitoring of vital signs, hemodynamics,respiratory and cardiac monitoring, extensive review of multiple databases, frequent neurological assessment, discussion with family, other specialists and medical decision making of high complexity.I have made any additions or clarifications directly to the above note.This critical care time does not reflect procedure time, or teaching time or  supervisory time of PA/NP/Med Resident etc but could involve care discussion time.  I spent 30 minutes of neurocritical care time  in the care of  this patient.  Antony Contras, MD Medical Director [place] Pager: [phone] 04/10/2020 3:16 PM  To contact Stroke Continuity provider, please refer to [email or web address] After hours, contact General Neurology

## 2020-04-10 NOTE — Consult Note (Signed)
Physical Medicine and Rehabilitation Consult   Reason for Consult: Functional decline.  Referring Physician: Dr. Leonie Man   HPI: [name] is a 79 y.o. female with history of metastatic endometrial cancer, HTN, recurrent left hip pain, PAF- not on anticoagulation and followed by hospice who was admitted on 04/09/20 with sudden onset of headache, right sided weakness and slurred speech. CT head revealed left thalamic hemorrhage felt to be hypertensive in nature and she was started on Cleveprex for BP goal<140.  MRI/MRA brain done today revealing stable bleed with mild edema and negative MRA. 2D echo done and work up underway. PT evaluation completed and CIR recommended due to functional deficits.    Review of Systems  Constitutional: Negative for chills and fever.  HENT: Negative for hearing loss and tinnitus.   Eyes: Negative for blurred vision and double vision.  Respiratory: Negative for cough and shortness of breath.   Cardiovascular: Negative for chest pain and leg swelling.  Gastrointestinal: Negative for constipation, heartburn and nausea.  Musculoskeletal: Positive for joint pain (right hip) and myalgias.  Skin: Negative for rash.  Neurological: Positive for sensory change (neuropathy bilateral feet--drags them). Negative for dizziness and headaches.  Psychiatric/Behavioral: The patient is nervous/anxious.       Past Medical History:  Diagnosis Date  . Arthritis    OSTEOARTHRITIS   -- CONSTANT PAIN RIGHT HIP---AND PAIN LEFT KNEE--PT STATES SHE GETS INJECTIONS INTO HER KNEE  . Complication of anesthesia    BLOOD PRESSURE DROPPED WITH NASAL SURGERY, ONE OF THE CARPAL TUNNEL REPAIRS AND DURING A COLONOSCOPY  . Dyslipidemia   . History of skin cancer   . Hypertension   . Hypothyroidism   . Menopausal symptoms   . NSTEMI (non-ST elevated myocardial infarction) ([place]) 12/09/15   Medical management: Distal branch of D1 95%, ostial D2 75%, distal LAD 50%. Tortuous  arteries consistent with hypertension  . Ovarian cancer ([place]) 01/2013   Recurrence since 2014/2016  . PAF (paroxysmal atrial fibrillation) ([place])    On ELIQUIS; On Amiodarone (Eye Exam 12/25/15) - no longer on Rythmol  . Stroke [facility]) 2017   no deficits    Past Surgical History:  Procedure Laterality Date  . BILATERAL CARPAL TUNNEL REPAIR  2007  . BREAST BIOPSY Left 03/19/2014   benign  . CARDIAC CATHETERIZATION N/A 12/05/2015   Procedure: Left Heart Cath and Coronary Angiography;  Surgeon: Belva Crome, MD;  Location: [place] [facility];  Service: Cardiovascular;  distal branch of D1 95%, ostial D2 75%, dLAD 50%,p-mRCA 40%. Tortuous vessels.  Marland Kitchen DILATION AND CURETTAGE OF UTERUS  1969  . ESOPHAGOGASTRODUODENOSCOPY (EGD) WITH PROPOFOL N/A 09/22/2019   Procedure: ESOPHAGOGASTRODUODENOSCOPY (EGD) WITH PROPOFOL;  Surgeon: Milus Banister, MD;  Location: [facility];  Service: Endoscopy;  Laterality: N/A;  . JOINT REPLACEMENT    . LAPAROTOMY Bilateral 02/13/2013   Procedure: EXPLORATORY LAPAROTOMY TOTAL ABDOMINAL HYSTERECTOMY BILATERAL SALPINGO-OOPHORECTOMY, Partial Rectal Resection with Reanastamosis;  Surgeon: Alvino Chapel, MD;  Location: [facility];  Service: Gynecology;  Laterality: Bilateral;  . LEFT KNEE ARTHROSCOPY   2011  . LYMPHADENECTOMY Right 02/13/2013   Procedure: PEVLIC  LYMPHADENECTOMY, DEBULKING right pelvic tumor nodules;  Surgeon: Alvino Chapel, MD;  Location: [facility];  Service: Gynecology;  Laterality: Right;  . NM MYOVIEW LTD  June 2010    subbmaximal with no ischemia or infarction.  . OMENTECTOMY  02/13/2013   Procedure: OMENTECTOMY;  Surgeon: Alvino Chapel, MD;  Location: [facility];  Service:  Gynecology;;  . RHINOPLASTY FOR FRACTURED NOSE  1986  . SURGERY FOR RUPTURED OVARIAN CYST  1969  . SYNOVECTOMY WITH POLY EXCHANGE Left 03/18/2017   Procedure: LEFT KNEE PARTIAL SYNOVECTOMY WITH POLY EXCHANGE;  Surgeon: Mcarthur Rossetti, MD;  Location: [facility];   Service: Orthopedics;  Laterality: Left;  Adductor Block  . TAH/BSO/Tumor debulking with right pelvic LND  01/2013  . TONSILLECTOMY  1962  . TOTAL HIP ARTHROPLASTY  02/25/2012   Procedure: TOTAL HIP ARTHROPLASTY ANTERIOR APPROACH;  Surgeon: Mcarthur Rossetti, MD;  Location: [facility];  Service: Orthopedics;  Laterality: Right;  . TOTAL KNEE ARTHROPLASTY Left 01/03/2015   Procedure: LEFT TOTAL KNEE ARTHROPLASTY;  Surgeon: Mcarthur Rossetti, MD;  Location: [facility];  Service: Orthopedics;  Laterality: Left;  . TRANSTHORACIC ECHOCARDIOGRAM  May 2014; January 2016   a. Normal LV size function. EF 60-65%. Grade 1 diastolic function. Mild MR and mildly elevated PA pressures of 37 mmHg per;; b. EF 60-65%. Mobile echodensity 10 mm x 6 mm attest interventricular septum is questionable fibroblastoma.  Marland Kitchen TRIGGER FINGER RELEASE Left 12/20/2017   Procedure: RELEASE TRIGGER FINGER/A-1 PULLEY LEFT RING FINGER;  Surgeon: Daryll Brod, MD;  Location: [place];  Service: Orthopedics;  Laterality: Left;   Family History  Problem Relation Age of Onset  . Hypertension Mother   . Basal cell carcinoma Mother   . AAA (abdominal aortic aneurysm) Father   . Heart Problems Maternal Uncle   . Heart Problems Maternal Grandfather   . AAA (abdominal aortic aneurysm) Paternal Grandmother   . Prostate cancer Maternal Uncle 70  . Prostate cancer Other   . Other Daughter        one daughter had TAH-BSO at 18; other daughter will have one soon    Social History:  Retired Marine scientist. Independent with cane PTA. Husband is retired and can assist after discharge. She  reports that she has never smoked. She has never used smokeless tobacco. She reports that she does not drink alcohol or use drugs.   Allergies  Allergen Reactions  . Clonidine Derivatives Shortness Of Breath  . Coreg [Carvedilol] Shortness Of Breath  . Augmentin [Amoxicillin-Pot Clavulanate] Diarrhea  . Caffeine Other (See Comments)    Makes  heart race  . Codeine Other (See Comments)    Does not like the feeling she gets  . Doxycycline     "Explosive diarrhea"  . Flexeril [Cyclobenzaprine] Other (See Comments)    Pt states "increased heart rate"  . Lipitor [Atorvastatin] Dermatitis    "feels like bugs are biting her"   . Lisinopril     LIP NUMBNESS  . Pravastatin Other (See Comments)    FEELS LIKE "BUG BITES" BITING HER LEGS   . Tape Rash    TEGADERM.   (use opsite on PAC)  . Tegaderm Ag Mesh [Silver] Rash and Other (See Comments)    Burns skin  . Ultram [Tramadol] Palpitations    Medications Prior to Admission  Medication Sig Dispense Refill  . acetaminophen (TYLENOL) 500 MG tablet Take 1,000 mg by mouth every 6 (six) hours as needed for mild pain.     Marland Kitchen amiodarone (PACERONE) 200 MG tablet Take 0.5 tablets (100 mg total) by mouth daily. If breakthrough afib. epsiode may take 400 mg twice a day until resolves then follow instructions 70 tablet 6  . Biotin 10 MG TABS Take 20 mg by mouth daily.    . bisacodyl (DULCOLAX) 5 MG EC tablet Take 5 mg  by mouth daily as needed for moderate constipation.    . diphenhydrAMINE (BENADRYL) 25 MG tablet Take 50 mg by mouth at bedtime as needed for sleep.     Marland Kitchen docusate sodium (COLACE) 100 MG capsule Take 100 mg by mouth daily as needed for mild constipation.    Marland Kitchen ezetimibe (ZETIA) 10 MG tablet Take 1 tablet (10 mg total) by mouth daily. (Patient taking differently: Take 10 mg by mouth at bedtime. ) 30 tablet 6  . Glucosamine HCl 1000 MG TABS Take 1,000 mg by mouth daily.    Marland Kitchen ipratropium (ATROVENT) 0.06 % nasal spray Place 2 sprays into both nostrils 2 (two) times daily.   5  . Melatonin 5 MG CAPS Take 5 mg by mouth at bedtime as needed (sleep). Reported on 12/25/2015    . Menthol, Topical Analgesic, (ICY HOT EX) Apply 1 application topically 2 (two) times daily as needed (PAIN).     . Multiple Vitamin (MULTIVITAMIN WITH MINERALS) TABS tablet Take 1 tablet by mouth daily. Centrum Silver     . oxyCODONE (OXY IR/ROXICODONE) 5 MG immediate release tablet Take 5 mg by mouth 2 (two) times daily as needed for moderate pain.     Vladimir Faster Glycol-Propyl Glycol (SYSTANE OP) Place 1 drop into both eyes 2 (two) times daily.     . potassium chloride (K-DUR) 10 MEQ tablet Take 10 mEq by mouth daily.    . SENEXON-S 8.6-50 MG tablet Take 1 tablet by mouth daily as needed for mild constipation.     Marland Kitchen SYNTHROID 88 MCG tablet Take 88 mcg by mouth daily.    Marland Kitchen lidocaine-prilocaine (EMLA) cream APPLY TO PORTA-CATH SITE 1-2 HOURS PRIOR TO ACCESS AS DIRECTED. (Patient not taking: Apply to Porta-Cath site 1-2 hours prior to access as directed.) 30 g 1  . ondansetron (ZOFRAN) 8 MG tablet TAKE 1 TABLET (8 MG TOTAL) BY MOUTH EVERY 8 (EIGHT) HOURS AS NEEDED FOR NAUSEA OR VOMITING. (Patient not taking: Reported on 04/10/2020) 90 tablet 1  . prochlorperazine (COMPAZINE) 10 MG tablet TAKE 1 TABLET (10 MG TOTAL) BY MOUTH EVERY 6 (SIX) HOURS AS NEEDED (NAUSEA OR VOMITING). (Patient not taking: Reported on 04/10/2020) 30 tablet 1    Home: [place] expects to be discharged to:: Private residence Living Arrangements: Spouse/significant other Available Help at Discharge: Family, Available 24 hours/day Type of Home: House Home Access: Stairs to enter [employer] of Steps: 5 Entrance Stairs-Rails: Left Home Layout: One level Bathroom Shower/Tub: Chiropodist: Standard Home Equipment: Environmental consultant - 2 wheels, [employer] - single point, Civil engineer, contracting, Grab bars - tub/shower  Functional History: Prior Function Level of Independence: Independent with assistive device(s) Comments: ambulating with cane Functional Status:  Mobility: Bed Mobility Overal bed mobility: Needs Assistance Bed Mobility: Supine to Sit Supine to sit: Min assist Transfers Overall transfer level: Needs assistance Equipment used: 1 person hand held assist Transfers: Stand Pivot Transfers, Sit to/from  Stand Sit to Stand: Min assist Stand pivot transfers: Mod assist General transfer comment: PT providing RUE hand hold, slow shuffling steps to turn from bed to recliner with increased trunk flexion      ADL:    Cognition: Cognition Overall Cognitive Status: Impaired/Different from baseline Orientation Level: Oriented to person, Oriented to place, Oriented to situation Cognition Arousal/Alertness: Awake/alert Behavior During Therapy: WFL for tasks assessed/performed Overall Cognitive Status: Impaired/Different from baseline Area of Impairment: Problem solving Problem Solving: Slow processing General Comments: slowed processing, pt also initially confused on date  later realizing that calendar in room was innacurate   Blood pressure 124/78, pulse 76, temperature (P) 98.5 F (36.9 C), temperature source (P) Oral, resp. rate 18, height 5\' 3"  (1.6 m), weight 60.4 kg, SpO2 98 %.  Physical Exam  General: Alert and oriented x 3, No apparent distress HEENT: Head is normocephalic, atraumatic, PERRLA, EOMI, sclera anicteric, oral mucosa pink and moist, dentition intact, ext ear canals clear,  Neck: Supple without JVD or lymphadenopathy Heart: Reg rate and rhythm. No murmurs rubs or gallops Chest: CTA bilaterally without wheezes, rales, or rhonchi; no distress Abdomen: Soft, non-tender, distended, bowel sounds positive. Extremities: No clubbing, cyanosis, or edema. Pulses are 2+ Skin: Clean and intact without signs of breakdown Neuro: Mild word finding difficulty but able to answer questions and follow commands without difficulty.  Mild dysarthria.  Motor function is grossly 4/5. Sensation intact.  Musculoskeletal: Well healed old L-TKR incision. Arthritic changes bilateral hands with cyanotic finger tips. Functional mobility: Sit to stand with MinA but felt poorly upon standing and needed to sit down.    Psych: Pt's affect is appropriate. Pt is cooperative    Results for orders placed  or performed during the hospital encounter of 04/09/20 (from the past 24 hour(s))  CBG monitoring, ED     Status: None   Collection Time: 04/09/20  7:35 PM  Result Value Ref Range   Glucose-Capillary 99 70 - 99 mg/dL  Urine rapid drug screen (hosp performed)     Status: None   Collection Time: 04/09/20  7:37 PM  Result Value Ref Range   Opiates NONE DETECTED NONE DETECTED   Cocaine NONE DETECTED NONE DETECTED   Benzodiazepines NONE DETECTED NONE DETECTED   Amphetamines NONE DETECTED NONE DETECTED   Tetrahydrocannabinol NONE DETECTED NONE DETECTED   Barbiturates NONE DETECTED NONE DETECTED  Urinalysis, Routine w reflex microscopic     Status: Abnormal   Collection Time: 04/09/20  7:37 PM  Result Value Ref Range   Color, Urine STRAW (A) YELLOW   APPearance CLEAR CLEAR   Specific Gravity, Urine 1.008 1.005 - 1.030   pH 7.0 5.0 - 8.0   Glucose, UA NEGATIVE NEGATIVE mg/dL   Hgb urine dipstick NEGATIVE NEGATIVE   Bilirubin Urine NEGATIVE NEGATIVE   Ketones, ur NEGATIVE NEGATIVE mg/dL   Protein, ur NEGATIVE NEGATIVE mg/dL   Nitrite NEGATIVE NEGATIVE   Leukocytes,Ua NEGATIVE NEGATIVE  Ethanol     Status: None   Collection Time: 04/09/20  7:39 PM  Result Value Ref Range   Alcohol, Ethyl (B) <10 <10 mg/dL  Protime-INR     Status: None   Collection Time: 04/09/20  7:39 PM  Result Value Ref Range   Prothrombin Time 12.6 11.4 - 15.2 seconds   INR 1.0 0.8 - 1.2  APTT     Status: None   Collection Time: 04/09/20  7:39 PM  Result Value Ref Range   aPTT 34 24 - 36 seconds  CBC     Status: Abnormal   Collection Time: 04/09/20  7:39 PM  Result Value Ref Range   WBC 6.6 4.0 - 10.5 K/uL   RBC 3.43 (L) 3.87 - 5.11 MIL/uL   Hemoglobin 11.2 (L) 12.0 - 15.0 g/dL   HCT 34.9 (L) 36.0 - 46.0 %   MCV 101.7 (H) 80.0 - 100.0 fL   MCH 32.7 26.0 - 34.0 pg   MCHC 32.1 30.0 - 36.0 g/dL   RDW 13.7 11.5 - 15.5 %   Platelets 170 150 - 400  K/uL   nRBC 0.0 0.0 - 0.2 %  Differential     Status: None    Collection Time: 04/09/20  7:39 PM  Result Value Ref Range   Neutrophils Relative % 61 %   Neutro Abs 4.0 1.7 - 7.7 K/uL   Lymphocytes Relative 28 %   Lymphs Abs 1.9 0.7 - 4.0 K/uL   Monocytes Relative 11 %   Monocytes Absolute 0.7 0.1 - 1.0 K/uL   Eosinophils Relative 0 %   Eosinophils Absolute 0.0 0.0 - 0.5 K/uL   Basophils Relative 0 %   Basophils Absolute 0.0 0.0 - 0.1 K/uL   Immature Granulocytes 0 %   Abs Immature Granulocytes 0.01 0.00 - 0.07 K/uL  Comprehensive metabolic panel     Status: Abnormal   Collection Time: 04/09/20  7:39 PM  Result Value Ref Range   Sodium 137 135 - 145 mmol/L   Potassium 4.1 3.5 - 5.1 mmol/L   Chloride 101 98 - 111 mmol/L   CO2 25 22 - 32 mmol/L   Glucose, Bld 115 (H) 70 - 99 mg/dL   BUN 26 (H) 8 - 23 mg/dL   Creatinine, Ser 1.24 (H) 0.44 - 1.00 mg/dL   Calcium 9.4 8.9 - 10.3 mg/dL   Total Protein 6.3 (L) 6.5 - 8.1 g/dL   Albumin 4.0 3.5 - 5.0 g/dL   AST 24 15 - 41 U/L   ALT 14 0 - 44 U/L   Alkaline Phosphatase 72 38 - 126 U/L   Total Bilirubin 0.5 0.3 - 1.2 mg/dL   GFR calc non Af Amer 42 (L) >60 mL/min   GFR calc Af Amer 49 (L) >60 mL/min   Anion gap 11 5 - 15  I-stat chem 8, ED     Status: Abnormal   Collection Time: 04/09/20  7:40 PM  Result Value Ref Range   Sodium 138 135 - 145 mmol/L   Potassium 4.1 3.5 - 5.1 mmol/L   Chloride 100 98 - 111 mmol/L   BUN 26 (H) 8 - 23 mg/dL   Creatinine, Ser 1.10 (H) 0.44 - 1.00 mg/dL   Glucose, Bld 105 (H) 70 - 99 mg/dL   Calcium, Ion 1.12 (L) 1.15 - 1.40 mmol/L   TCO2 30 22 - 32 mmol/L   Hemoglobin 11.2 (L) 12.0 - 15.0 g/dL   HCT 33.0 (L) 36.0 - 46.0 %  SARS Coronavirus 2 by RT PCR (hospital order, performed in [place] hospital lab) Nasopharyngeal Nasopharyngeal Swab     Status: None   Collection Time: 04/09/20  8:40 PM   Specimen: Nasopharyngeal Swab  Result Value Ref Range   SARS Coronavirus 2 NEGATIVE NEGATIVE  MRSA PCR Screening     Status: None   Collection Time: 04/09/20  8:40  PM   Specimen: Nasopharyngeal  Result Value Ref Range   MRSA by PCR NEGATIVE NEGATIVE  Lipid panel     Status: Abnormal   Collection Time: 04/10/20  9:37 AM  Result Value Ref Range   Cholesterol 215 (H) 0 - 200 mg/dL   Triglycerides 79 <150 mg/dL   HDL 70 >40 mg/dL   Total CHOL/HDL Ratio 3.1 RATIO   VLDL 16 0 - 40 mg/dL   LDL Cholesterol 129 (H) 0 - 99 mg/dL  Hemoglobin A1c     Status: None   Collection Time: 04/10/20  9:37 AM  Result Value Ref Range   Hgb A1c MFr Bld 5.2 4.8 - 5.6 %   Mean Plasma Glucose 102.54  mg/dL   *Note: Due to a large number of results and/or encounters for the requested time period, some results have not been displayed. A complete set of results can be found in Results Review.   MR ANGIO HEAD WO CONTRAST  Result Date: 04/10/2020 CLINICAL DATA:  Stroke follow-up. Acute right-sided weakness, headache, and slurred speech. History of atrial fibrillation and endometrial cancer. Left thalamic hemorrhage on CT. EXAM: MRI HEAD WITHOUT AND WITH CONTRAST MRA HEAD WITHOUT CONTRAST TECHNIQUE: Multiplanar, multiecho pulse sequences of the brain and surrounding structures were obtained without and with intravenous contrast. Angiographic images of the head were obtained using MRA technique without contrast. CONTRAST:  7.11mL GADAVIST GADOBUTROL 1 MMOL/ML IV SOLN COMPARISON:  Head CT 04/09/2020, MRI 12/07/2015, and MRA 02/10/2011 FINDINGS: MRI HEAD FINDINGS The study is mildly motion degraded. Brain: A left thalamic hemorrhage has not significantly changed in size from yesterday's head CT measuring 0.8 x 2.1 x 2.0 cm (AP x transverse x craniocaudal). Mild surrounding edema has slightly increased without midline shift or other significant mass effect. There is no intraventricular extension. There is minimal enhancement along the margins of the hematoma without nodular or masslike enhancement. Elsewhere, no acute infarct or abnormal enhancement is identified. There is no extra-axial  fluid collection. A chronic microhemorrhage in the right parietal lobe was not evident on the prior MRI. Chronic microhemorrhages in the posterior left temporal lobe and left cerebellum on the prior MRI are not well seen on today's study which does not include susceptibility weighted imaging. Patchy T2 hyperintensities in the cerebral white matter bilaterally are stable to slightly increased from the prior MRI and are nonspecific but compatible with moderate chronic small vessel ischemic disease. There is mild to moderate cerebral atrophy. Vascular: Major intracranial vascular flow voids are preserved. Skull and upper cervical spine: Extraction. Trace unremarkable bone marrow signal. Sinuses/Orbits: Trace bilateral mastoid effusions. Clear paranasal sinuses. Other: None. MRA HEAD FINDINGS The visualized distal vertebral arteries are patent to the basilar with the left being moderately dominant. The left PICA and right AICA appear dominant. The SCAs are grossly patent proximally. The basilar artery is widely patent. There are large posterior communicating arteries bilaterally with hypoplastic left and absent right P1 segments. Both PCAs are patent without evidence of significant proximal stenosis. The internal carotid arteries are widely patent from skull base to carotid termini. ACAs and MCAs are patent without evidence of proximal branch occlusion or significant proximal stenosis. No aneurysm is identified. IMPRESSION: 1. Unchanged size of the left thalamic hemorrhage with mild surrounding edema. No intraventricular extension, significant mass effect, or underlying lesion identified. 2. Moderate chronic small vessel ischemic disease and cerebral atrophy. 3. Negative head MRA. Electronically Signed   By: Logan Bores M.D.   On: 04/10/2020 13:04   MR BRAIN W WO CONTRAST  Result Date: 04/10/2020 CLINICAL DATA:  Stroke follow-up. Acute right-sided weakness, headache, and slurred speech. History of atrial  fibrillation and endometrial cancer. Left thalamic hemorrhage on CT. EXAM: MRI HEAD WITHOUT AND WITH CONTRAST MRA HEAD WITHOUT CONTRAST TECHNIQUE: Multiplanar, multiecho pulse sequences of the brain and surrounding structures were obtained without and with intravenous contrast. Angiographic images of the head were obtained using MRA technique without contrast. CONTRAST:  7.13mL GADAVIST GADOBUTROL 1 MMOL/ML IV SOLN COMPARISON:  Head CT 04/09/2020, MRI 12/07/2015, and MRA 02/10/2011 FINDINGS: MRI HEAD FINDINGS The study is mildly motion degraded. Brain: A left thalamic hemorrhage has not significantly changed in size from yesterday's head CT measuring 0.8 x 2.1 x  2.0 cm (AP x transverse x craniocaudal). Mild surrounding edema has slightly increased without midline shift or other significant mass effect. There is no intraventricular extension. There is minimal enhancement along the margins of the hematoma without nodular or masslike enhancement. Elsewhere, no acute infarct or abnormal enhancement is identified. There is no extra-axial fluid collection. A chronic microhemorrhage in the right parietal lobe was not evident on the prior MRI. Chronic microhemorrhages in the posterior left temporal lobe and left cerebellum on the prior MRI are not well seen on today's study which does not include susceptibility weighted imaging. Patchy T2 hyperintensities in the cerebral white matter bilaterally are stable to slightly increased from the prior MRI and are nonspecific but compatible with moderate chronic small vessel ischemic disease. There is mild to moderate cerebral atrophy. Vascular: Major intracranial vascular flow voids are preserved. Skull and upper cervical spine: Extraction. Trace unremarkable bone marrow signal. Sinuses/Orbits: Trace bilateral mastoid effusions. Clear paranasal sinuses. Other: None. MRA HEAD FINDINGS The visualized distal vertebral arteries are patent to the basilar with the left being moderately  dominant. The left PICA and right AICA appear dominant. The SCAs are grossly patent proximally. The basilar artery is widely patent. There are large posterior communicating arteries bilaterally with hypoplastic left and absent right P1 segments. Both PCAs are patent without evidence of significant proximal stenosis. The internal carotid arteries are widely patent from skull base to carotid termini. ACAs and MCAs are patent without evidence of proximal branch occlusion or significant proximal stenosis. No aneurysm is identified. IMPRESSION: 1. Unchanged size of the left thalamic hemorrhage with mild surrounding edema. No intraventricular extension, significant mass effect, or underlying lesion identified. 2. Moderate chronic small vessel ischemic disease and cerebral atrophy. 3. Negative head MRA. Electronically Signed   By: Logan Bores M.D.   On: 04/10/2020 13:04   CT HEAD CODE STROKE WO CONTRAST  Result Date: 04/09/2020 CLINICAL DATA:  Code stroke. Neuro deficit, acute, stroke suspected. Additional history provided: Last known normal 18:00. Left-sided weakness/slurred speech. EXAM: CT HEAD WITHOUT CONTRAST TECHNIQUE: Contiguous axial images were obtained from the base of the skull through the vertex without intravenous contrast. COMPARISON:  Brain MRI 12/07/2015, head CT 11/26/2015 FINDINGS: Brain: Parenchymal hemorrhage centered within the left thalamocapsular junction measuring 1.0 x 2.1 x 2.7 cm (AP x TV x CC) there is mild surrounding edema. No intraventricular extension of hemorrhage at this time. No significant ventricular effacement. No midline shift. Redemonstrated ill-defined hypoattenuation within the cerebral white matter is nonspecific, but consistent with chronic small vessel ischemic disease. Stable, mild generalized parenchymal atrophy. No demarcated cortical infarct. No extra-axial fluid collection. No evidence of intracranial mass. Vascular: No hyperdense vessel.  Atherosclerotic  calcifications. Skull: Normal. Negative for fracture or focal lesion. Sinuses/Orbits: Visualized orbits show no acute finding. No significant paranasal sinus disease or mastoid effusion at the imaged levels. These results were called by telephone at the time of interpretation on 04/09/2020 at 7:57 pm to provider Dr. Lorraine Lax, who verbally acknowledged these results. IMPRESSION: 1.0 x 2.1 x 2.7 cm acute parenchymal hemorrhage within the left thalamocapsular junction. This is a characteristic location for a hypertensive hemorrhage. Mild surrounding edema. No intraventricular extension of hemorrhage or significant mass effect at this time. Background generalized parenchymal atrophy and chronic small vessel ischemic disease. Electronically Signed   By: Kellie Simmering DO   On: 04/09/2020 19:52     Assessment/Plan: Diagnosis: L thalamic hemorrhage most likely secondary to HTN 1. Does the need for close, 24 hr/day  medical supervision in concert with the patient's rehab needs make it unreasonable for this patient to be served in a less intensive setting? Yes 2. 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 Due to bladder management, bowel management, safety, skin/wound care, disease management, medication administration, pain management and patient education, does the patient require 24 hr/day rehab nursing? Yes 4. Does the patient require coordinated care of a physician, rehab nurse, therapy disciplines of PT, OT to address physical and functional deficits in the context of the above medical diagnosis(es)? Yes Addressing deficits in the following areas: balance, endurance, locomotion, strength, transferring, bowel/bladder control, bathing, dressing, feeding, grooming, toileting, speech and psychosocial support 5. Can the patient actively  participate in an intensive therapy program of at least 3 hrs of therapy per day at least 5 days per week? Yes 6. The potential for patient to make measurable gains while on inpatient rehab is excellent 7. Anticipated functional outcomes upon discharge from inpatient rehab are modified independent  with PT, modified independent with OT, independent with SLP. 8. Estimated rehab length of stay to reach the above functional goals is: 10-14 days 9. Anticipated discharge destination: Home 10. Overall Rehab/Functional Prognosis: excellent  RECOMMENDATIONS: This patient's condition is appropriate for continued rehabilitative care in the following setting: CIR Patient has agreed to participate in recommended program. Potentially Note that insurance prior authorization may be required for reimbursement for recommended care.  Comment: Mrs. Muratori would be an excellent CIR candidate. At this time she prefers to go home where she lives with her husband. Educated regarding the difference in home vs inpatient therapies. She would likely be very limited in mobility were she to be discharged home given her current difficulty with standing. Advised that she see how she does with therapy again tomorrow before making her decision. We will continue to follow in Mrs. Daffron's care. Thank you for this consult.   Bary Leriche, PA-C 04/10/2020   I have personally performed a face to face diagnostic evaluation, including, but not limited to relevant history and physical exam findings, of this patient and developed relevant assessment and plan.  Additionally, I have reviewed and concur with the physician assistant's documentation above.  Leeroy Cha, MD

## 2020-04-10 NOTE — Progress Notes (Signed)
Pt had many questions about her evening scheduled medications, refused to take some stating "I don't know why I'm taking those. I don't take those at home." I explained to the pt what they were indicated for and that they were found on her home medication list in her chart. She still stated she does not take them at home. Pt able to answer orientation questions correctly, some expressive aphasia as was noted during day shift as well.

## 2020-04-10 NOTE — Progress Notes (Signed)
SLP Cancellation Note  Patient Details Name: RAFAELITA GREEVER MRN: [id number] DOB: June 12, 1941   Cancelled treatment:    Speech/Language Evaluation attempt. Pt was not available d/t being off unit for MRI. Per discussion with RN, no swallowing concerns, but some difficulty with speech/language. ST will follow up as able for evaluation.   Chanele Douglas P. Rashay Barnette, M.S., [place] Pathologist Acute Rehabilitation Services Pager: [phone] 04/10/2020, 11:49 AM

## 2020-04-10 NOTE — H&P (Signed)
Requesting Physician: Dr.     Laurel Dimmer Complaint: right side weakness, headache  History obtained from: Patient and Chart   HPI:                                                                                                                                       [name] is a 79 y.o. female with past medical history significant for metastatic endometrial cancer, hypertension, hyperlipidemia, hypothyroidism, paroxysmal atrial fibrillation not on any anticoagulation presents to the emergency department with sudden onset right-sided weakness and headache associated with slurred speech.  She was brought into the emergency department as a code stroke.  Last known normal was around 6 PM.  Patient [facility], ER-her blood pressure was [id number] systolic.  NIH stroke scale was 3-for right arm and right leg drift, reduced sensation.  Stat CT head was obtained which showed a left thalamic hemorrhage.  Patient and husband confirm she is no longer on anticoagulation.  INR and APTT within normal limits.  Patient was started on Cleviprex drip for rapid blood pressure control.  Date last known well: 04/09/2020 Time last known well: 6 PM tPA Given: No, hemorrhage NIHSS: 3 Baseline MRS 0  Intracerebral Hemorrhage (ICH) Score  Glascow Coma Score 13-15 0  Age >/= 80 yes no 0  ICH volume >/= 98ml no 0  IVH yes no 0  Infratentorial origin no 0 Total:  0   Past Medical History:  Diagnosis Date  . Arthritis    OSTEOARTHRITIS   -- CONSTANT PAIN RIGHT HIP---AND PAIN LEFT KNEE--PT STATES SHE GETS INJECTIONS INTO HER KNEE  . Complication of anesthesia    BLOOD PRESSURE DROPPED WITH NASAL SURGERY, ONE OF THE CARPAL TUNNEL REPAIRS AND DURING A COLONOSCOPY  . Dyslipidemia   . History of skin cancer   . Hypertension   . Hypothyroidism   . Menopausal symptoms   . NSTEMI (non-ST elevated myocardial infarction) ([place]) 12/09/15   Medical management: Distal branch of D1 95%, ostial D2 75%, distal  LAD 50%. Tortuous arteries consistent with hypertension  . Ovarian cancer ([place]) 01/2013   Recurrence since 2014/2016  . PAF (paroxysmal atrial fibrillation) ([place])    On ELIQUIS; On Amiodarone (Eye Exam 12/25/15) - no longer on Rythmol  . Stroke [facility]) 2017   no deficits    Past Surgical History:  Procedure Laterality Date  . BILATERAL CARPAL TUNNEL REPAIR  2007  . BREAST BIOPSY Left 03/19/2014   benign  . CARDIAC CATHETERIZATION N/A 12/05/2015   Procedure: Left Heart Cath and Coronary Angiography;  Surgeon: Belva Crome, MD;  Location: [facility];  Service: Cardiovascular;  distal branch of D1 95%, ostial D2 75%, dLAD 50%,p-mRCA 40%. Tortuous vessels.  Marland Kitchen DILATION AND CURETTAGE OF UTERUS  1969  . ESOPHAGOGASTRODUODENOSCOPY (EGD) WITH PROPOFOL N/A 09/22/2019   Procedure: ESOPHAGOGASTRODUODENOSCOPY (EGD) WITH PROPOFOL;  Surgeon: Milus Banister, MD;  Location: [facility];  Service: Endoscopy;  Laterality: N/A;  . JOINT REPLACEMENT    . LAPAROTOMY Bilateral 02/13/2013   Procedure: EXPLORATORY LAPAROTOMY TOTAL ABDOMINAL HYSTERECTOMY BILATERAL SALPINGO-OOPHORECTOMY, Partial Rectal Resection with Reanastamosis;  Surgeon: Alvino Chapel, MD;  Location: [facility];  Service: Gynecology;  Laterality: Bilateral;  . LEFT KNEE ARTHROSCOPY   2011  . LYMPHADENECTOMY Right 02/13/2013   Procedure: PEVLIC  LYMPHADENECTOMY, DEBULKING right pelvic tumor nodules;  Surgeon: Alvino Chapel, MD;  Location: [facility];  Service: Gynecology;  Laterality: Right;  . NM MYOVIEW LTD  June 2010    subbmaximal with no ischemia or infarction.  . OMENTECTOMY  02/13/2013   Procedure: OMENTECTOMY;  Surgeon: Alvino Chapel, MD;  Location: [facility];  Service: Gynecology;;  . [place]  . SURGERY FOR RUPTURED OVARIAN CYST  1969  . SYNOVECTOMY WITH POLY EXCHANGE Left 03/18/2017   Procedure: LEFT KNEE PARTIAL SYNOVECTOMY WITH POLY EXCHANGE;  Surgeon: Mcarthur Rossetti, MD;   Location: [facility];  Service: Orthopedics;  Laterality: Left;  Adductor Block  . TAH/BSO/Tumor debulking with right pelvic LND  01/2013  . TONSILLECTOMY  1962  . TOTAL HIP ARTHROPLASTY  02/25/2012   Procedure: TOTAL HIP ARTHROPLASTY ANTERIOR APPROACH;  Surgeon: Mcarthur Rossetti, MD;  Location: [facility];  Service: Orthopedics;  Laterality: Right;  . TOTAL KNEE ARTHROPLASTY Left 01/03/2015   Procedure: LEFT TOTAL KNEE ARTHROPLASTY;  Surgeon: Mcarthur Rossetti, MD;  Location: [facility];  Service: Orthopedics;  Laterality: Left;  . TRANSTHORACIC ECHOCARDIOGRAM  May 2014; January 2016   a. Normal LV size function. EF 60-65%. Grade 1 diastolic function. Mild MR and mildly elevated PA pressures of 37 mmHg per;; b. EF 60-65%. Mobile echodensity 10 mm x 6 mm attest interventricular septum is questionable fibroblastoma.  Marland Kitchen TRIGGER FINGER RELEASE Left 12/20/2017   Procedure: RELEASE TRIGGER FINGER/A-1 PULLEY LEFT RING FINGER;  Surgeon: Daryll Brod, MD;  Location: [facility];  Service: Orthopedics;  Laterality: Left;    Family History  Problem Relation Age of Onset  . Hypertension Mother   . Basal cell carcinoma Mother   . AAA (abdominal aortic aneurysm) Father   . Heart Problems Maternal Uncle   . Heart Problems Maternal Grandfather   . AAA (abdominal aortic aneurysm) Paternal Grandmother   . Prostate cancer Maternal Uncle 70  . Prostate cancer Other   . Other Daughter        one daughter had TAH-BSO at 41; other daughter will have one soon   Social History:  reports that she has never smoked. She has never used smokeless tobacco. She reports that she does not drink alcohol or use drugs.  Allergies:  Allergies  Allergen Reactions  . Clonidine Derivatives Shortness Of Breath  . Coreg [Carvedilol] Shortness Of Breath  . Augmentin [Amoxicillin-Pot Clavulanate] Diarrhea  . Caffeine Other (See Comments)    Makes heart race  . Codeine Other (See Comments)    Does not like the  feeling she gets  . Doxycycline     "Explosive diarrhea"  . Flexeril [Cyclobenzaprine] Other (See Comments)    Pt states "increased heart rate"  . Lipitor [Atorvastatin] Dermatitis    "feels like bugs are biting her"   . Lisinopril     LIP NUMBNESS  . Pravastatin Other (See Comments)    FEELS LIKE "BUG BITES" BITING HER LEGS   . Tape Rash    TEGADERM.   (use opsite on PAC)  . Tegaderm  Ag Mesh [Silver] Rash and Other (See Comments)    Burns skin  . Ultram [Tramadol] Palpitations    Medications:                                                                                                                        I reviewed home medications   ROS:                                                                                                                                     14 systems reviewed and negative except above   Examination:                                                                                                      General: Appears well-developed . Psych: Affect appropriate to situation Eyes: No scleral injection HENT: No OP obstrucion Head: Normocephalic.  Cardiovascular: Normal rate and regular rhythm.  Respiratory: Effort normal and breath sounds normal to anterior ascultation GI: Soft.  No distension. There is no tenderness.  Skin: WDI    Neurological Examination Mental Status: Alert, oriented, thought content appropriate.  Speech fluent without evidence of aphasia. Able to follow 3 step commands without difficulty. Cranial Nerves: II: Visual fields grossly normal,  III,IV, VI: ptosis not present, extra-ocular motions intact bilaterally, pupils equal, round, reactive to light and accommodation V,VII: Mild nasolabial fold flattening, reduced sensation on the right side of the face VIII: hearing normal bilaterally IX,X: uvula rises symmetrically XI: bilateral shoulder shrug XII: midline tongue extension Motor: Right : Upper extremity    4/5    Left:     Upper extremity   5/5  Lower extremity   4/5     Lower extremity   5/5 Tone and bulk:normal tone throughout; no atrophy noted Sensory: Reduced sensation to light touch in the right arm and leg compared to the left Deep Tendon Reflexes: 2+ and symmetric throughout Plantars: Right: downgoing   Left: downgoing Cerebellar: normal finger-to-nose,  normal rapid alternating movements and normal heel-to-shin test      Lab Results: Basic Metabolic Panel: Recent Labs  Lab 04/09/20 1939 04/09/20 1940  NA 137 138  K 4.1 4.1  CL 101 100  CO2 25  --   GLUCOSE 115* 105*  BUN 26* 26*  CREATININE 1.24* 1.10*  CALCIUM 9.4  --     CBC: Recent Labs  Lab 04/09/20 1939 04/09/20 1940  WBC 6.6  --   NEUTROABS 4.0  --   HGB 11.2* 11.2*  HCT 34.9* 33.0*  MCV 101.7*  --   PLT 170  --     Coagulation Studies: Recent Labs    04/09/20 1939  LABPROT 12.6  INR 1.0    Imaging: US Guided Needle Placement - No Linked Charges  Result Date: 04/08/2020 Ultrasound-guided left hip injection: After sterile prep with Betadine, injected 8 cc 1% lidocaine without epinephrine and 40 mg methylprednisolone using a 22-gauge spinal needle, passing the needle through the iliofemoral ligament into the femoral head/neck junction.  Injectate was seen filling the joint capsule.   CT HEAD CODE STROKE WO CONTRAST  Result Date: 04/09/2020 CLINICAL DATA:  Code stroke. Neuro deficit, acute, stroke suspected. Additional history provided: Last known normal 18:00. Left-sided weakness/slurred speech. EXAM: CT HEAD WITHOUT CONTRAST TECHNIQUE: Contiguous axial images were obtained from the base of the skull through the vertex without intravenous contrast. COMPARISON:  Brain MRI 12/07/2015, head CT 11/26/2015 FINDINGS: Brain: Parenchymal hemorrhage centered within the left thalamocapsular junction measuring 1.0 x 2.1 x 2.7 cm (AP x TV x CC) there is mild surrounding edema. No intraventricular extension of  hemorrhage at this time. No significant ventricular effacement. No midline shift. Redemonstrated ill-defined hypoattenuation within the cerebral white matter is nonspecific, but consistent with chronic small vessel ischemic disease. Stable, mild generalized parenchymal atrophy. No demarcated cortical infarct. No extra-axial fluid collection. No evidence of intracranial mass. Vascular: No hyperdense vessel.  Atherosclerotic calcifications. Skull: Normal. Negative for fracture or focal lesion. Sinuses/Orbits: Visualized orbits show no acute finding. No significant paranasal sinus disease or mastoid effusion at the imaged levels. These results were called by telephone at the time of interpretation on 04/09/2020 at 7:57 pm to provider Dr. Lorraine Lax, who verbally acknowledged these results. IMPRESSION: 1.0 x 2.1 x 2.7 cm acute parenchymal hemorrhage within the left thalamocapsular junction. This is a characteristic location for a hypertensive hemorrhage. Mild surrounding edema. No intraventricular extension of hemorrhage or significant mass effect at this time. Background generalized parenchymal atrophy and chronic small vessel ischemic disease. Electronically Signed   By: Kellie Simmering DO   On: 04/09/2020 19:52     ASSESSMENT AND PLAN  79 y.o. female with past medical history significant for metastatic endometrial cancer, hypertension, hyperlipidemia, hypothyroidism, paroxysmal atrial fibrillation not on any anticoagulation presents to the emergency department with sudden onset right-sided weakness and headache associated with slurred speech due to left thalamic hemorrhage.  Etiology of hemorrhage likely hypertensive    Left thalamic hemorrhage Admit to neuro ICU for blood pressure control and close monitoring No antiplatelets/anticoagulants Blood pressure goal less than [id number] systolic, Cleviprex initiated Frequent neurochecks Stroke swallow screen  Hypertensive emergency -Blood pressure control as stated above  with Cleviprex gtt.  Atrial fibrillation -Continue home amiodarone for rate control  Hyperlipidemia -no statin due to hemorrhage  CODE STATUS: Limited DNR DVT prophylaxis: SCD    This patient is neurologically critically ill due to [place].  She is at risk for significant risk of neurological worsening from  cerebral edema,  death from brain herniation, heart failure,infection, respiratory failure and seizure. This patient's care requires constant monitoring of vital signs, hemodynamics, respiratory and cardiac monitoring, review of multiple databases, neurological assessment, discussion with family, other specialists and medical decision making of high complexity.  I spent 45  minutes of neurocritical time in the care of this patient.      Jozi Malachi [facility] Pager Number [phone]

## 2020-04-10 NOTE — Evaluation (Signed)
Physical Therapy Evaluation Patient Details Name: [name] MRN: [id number] DOB: 1941/02/26 Today's Date: 04/10/2020   History of Present Illness  79 y.o. female with past medical history significant for metastatic endometrial cancer, hypertension, hyperlipidemia, hypothyroidism, paroxysmal atrial fibrillation not on any anticoagulation presents to the emergency department with sudden onset right-sided weakness and headache associated with slurred speech.  Clinical Impression  Pt presents to PT with deficits in functional mobility, gait, balance, endurance, power, cognition, and is limited by dizziness during session. Pt requires min-modA for all mobility this session due to generalized weakness and impaired balance. Pt reporting dizziness with change in position from lying to sitting, reduced in severity with continued sitting but does not subside totally when in recliner. Pt will benefit from continued acute PT POC to improve mobility wuality and tolerance while reducing falls risk. PT currently recommends CIR at this time as pt requires physical assistance to perform all functional mobility and demonstrates the potential to return to independent mobility.    Follow Up Recommendations CIR;Supervision/Assistance - 24 hour    Equipment Recommendations  Wheelchair (measurements PT);Wheelchair cushion (measurements PT)(if home today)    Recommendations for Other Services Rehab consult     Precautions / Restrictions Precautions Precautions: Fall Precaution Comments: BP <140 Restrictions Weight Bearing Restrictions: No      Mobility  Bed Mobility Overal bed mobility: Needs Assistance Bed Mobility: Supine to Sit     Supine to sit: Min assist        Transfers Overall transfer level: Needs assistance Equipment used: 1 person hand held assist Transfers: Stand Pivot Transfers;Sit to/from Stand Sit to Stand: Min assist Stand pivot transfers: Mod assist       General  transfer comment: PT providing RUE hand hold, slow shuffling steps to turn from bed to recliner with increased trunk flexion  Ambulation/Gait                Stairs            Wheelchair Mobility    Modified Rankin (Stroke Patients Only) Modified Rankin (Stroke Patients Only) Pre-Morbid Rankin Score: Slight disability Modified Rankin: Moderately severe disability     Balance Overall balance assessment: Needs assistance Sitting-balance support: Single extremity supported;Feet supported Sitting balance-Leahy Scale: Fair Sitting balance - Comments: minG-close supervision at edge of bed   Standing balance support: Single extremity supported Standing balance-Leahy Scale: Poor Standing balance comment: minA for static standing balance, RUE support                             Pertinent Vitals/Pain Pain Assessment: No/denies pain    Home Living Family/patient expects to be discharged to:: Private residence Living Arrangements: Spouse/significant other Available Help at Discharge: Family;Available 24 hours/day Type of Home: House Home Access: Stairs to enter Entrance Stairs-Rails: Left Entrance Stairs-Number of Steps: 5 Home Layout: One level Home Equipment: Walker - 2 wheels;Cane - single point;Shower seat;Grab bars - tub/shower      Prior Function Level of Independence: Independent with assistive device(s)         Comments: ambulating with cane     Hand Dominance        Extremity/Trunk Assessment   Upper Extremity Assessment Upper Extremity Assessment: Overall WFL for tasks assessed    Lower Extremity Assessment Lower Extremity Assessment: Generalized weakness;RLE deficits/detail;LLE deficits/detail(at least 4/5 BLE) RLE Deficits / Details: RLE knee valgus structural deformity, Bilateral hammer toes LLE Deficits / Details: bilateral hammer  toes    Cervical / Trunk Assessment Cervical / Trunk Assessment: Kyphotic  Communication    Communication: No difficulties  Cognition Arousal/Alertness: Awake/alert Behavior During Therapy: WFL for tasks assessed/performed Overall Cognitive Status: Impaired/Different from baseline Area of Impairment: Problem solving                             Problem Solving: Slow processing General Comments: slowed processing, pt also initially confused on date later realizing that calendar in room was innacurate      General Comments General comments (skin integrity, edema, etc.): BP pre-mobility 111/53 (71), sitting edge of bed-164/87 with pt reporting some dizziness, BP in chair 143/85 (102). Pt reports some dizziness, less significant than initially in bed. RN present at end of session and aware of symptoms    Exercises     Assessment/Plan    PT Assessment Patient needs continued PT services  PT Problem List Decreased strength;Decreased activity tolerance;Decreased balance;Decreased mobility;Decreased cognition;Decreased knowledge of use of DME;Decreased safety awareness;Decreased knowledge of precautions       PT Treatment Interventions DME instruction;Gait training;Stair training;Functional mobility training;Therapeutic activities;Therapeutic exercise;Balance training;Neuromuscular re-education;Cognitive remediation;Patient/family education    PT Goals (Current goals can be found in the Care Plan section)  Acute Rehab PT Goals Patient Stated Goal: To improve mobility and reduce dizziness PT Goal Formulation: With patient Time For Goal Achievement: 04/24/20 Potential to Achieve Goals: Good Additional Goals Additional Goal #1: Pt will maintain dynamic standing balance within 10 inches of her base of support with unilateral UE support and supervision    Frequency Min 4X/week   Barriers to discharge        Co-evaluation               AM-PAC PT "6 Clicks" Mobility  Outcome Measure Help needed turning from your back to your side while in a flat bed without  using bedrails?: A Little Help needed moving from lying on your back to sitting on the side of a flat bed without using bedrails?: A Little Help needed moving to and from a bed to a chair (including a wheelchair)?: A Lot Help needed standing up from a chair using your arms (e.g., wheelchair or bedside chair)?: A Little Help needed to walk in hospital room?: A Lot Help needed climbing 3-5 steps with a railing? : Total 6 Click Score: 14    End of Session   Activity Tolerance: Treatment limited secondary to medical complications (Comment)(dizziness) Patient left: in chair;with call bell/phone within reach;with chair alarm set;with family/visitor present Nurse Communication: Mobility status PT Visit Diagnosis: Unsteadiness on feet (R26.81);Other abnormalities of gait and mobility (R26.89);Muscle weakness (generalized) (M62.81);Other symptoms and signs involving the nervous system (R29.898)    Time: [id number] PT Time Calculation (min) (ACUTE ONLY): 23 min   Charges:   PT Evaluation $PT Eval Moderate Complexity: 1 Mod          Zenaida Niece, PT, DPT Acute Rehabilitation Pager: [phone]   Zenaida Niece 04/10/2020, 2:39 PM

## 2020-04-10 NOTE — Progress Notes (Signed)
  Echocardiogram 2D Echocardiogram has been performed.  [name] 04/10/2020, 11:39 AM

## 2020-04-10 NOTE — Progress Notes (Signed)
Rehab Admissions Coordinator Note:  Patient was screened by Cleatrice Burke for appropriateness for an Inpatient Acute Rehab Consult per therapy recs.  At this time, we are recommending Inpatient Rehab consult. I will place order per protocol.  Cleatrice Burke RN MSN 04/10/2020, 3:18 PM  I can be reached at [phone].

## 2020-04-11 LAB — TRIGLYCERIDES: Triglycerides: 93 mg/dL (ref <150)

## 2020-04-11 MED ORDER — AMLODIPINE BESYLATE 10 MG PO TABS: 10.0000 mg | ORAL_TABLET | Freq: Every day | ORAL | Status: DC

## 2020-04-11 MED ORDER — HYDROCHLOROTHIAZIDE 12.5 MG PO CAPS: 12.5000 mg | ORAL_CAPSULE | Freq: Every day | ORAL | 2 refills | Status: DC

## 2020-04-11 MED ORDER — AMLODIPINE BESYLATE 10 MG PO TABS: 10.0000 mg | ORAL_TABLET | Freq: Every day | ORAL | 2 refills | Status: DC

## 2020-04-11 MED ORDER — HYDRALAZINE HCL 20 MG/ML IJ SOLN: 10.0000 mg | INTRAMUSCULAR | Status: DC | PRN

## 2020-04-11 NOTE — Progress Notes (Signed)
Physical Therapy Treatment Patient Details Name: [name] MRN: [id number] DOB: October 04, 1941 Today's Date: 04/11/2020    History of Present Illness 79 y.o. female with past medical history significant for metastatic endometrial cancer, hypertension, hyperlipidemia, hypothyroidism, paroxysmal atrial fibrillation not on any anticoagulation presents to the emergency department with sudden onset right-sided weakness and headache associated with slurred speech.    PT Comments    Pt tolerated treatment well although continues to endorse dizziness with transition from supine to sitting. Pt demonstrating R inattention during ambulation, bumping into objects on R side with RW 3 times during mobility, even with PT cues to avoid object on last collision. Pt also continues to endorse word finding difficulties and difficulty producing the correct words that she wants to speak. Pt remains at an increase falls risk due to weakness inattention, and dizziness. PT continues to recommend CIR at this time as the pt will benefit from high intensity inpatient PT services to restore independence in mobility. If patient elects to discharge home then she will benefit from HHPT, use of RW for all OOB activity, and assistance from spouse for all mobility.  Follow Up Recommendations  CIR;Supervision/Assistance - 24 hour(HHPT if pt wants to discharge home)     Equipment Recommendations  3in1 (PT)    Recommendations for Other Services       Precautions / Restrictions Precautions Precautions: Fall Precaution Comments: BP <140 Restrictions Weight Bearing Restrictions: No    Mobility  Bed Mobility Overal bed mobility: Needs Assistance Bed Mobility: Supine to Sit     Supine to sit: Min guard        Transfers Overall transfer level: Needs assistance Equipment used: 1 person hand held assist;Rolling walker (2 wheeled) Transfers: Sit to/from Stand Sit to Stand: Min assist;Min guard         General  transfer comment: pt with minA for hand hold and minG with RW, PT cues for hand placement  Ambulation/Gait Ambulation/Gait assistance: Min assist Gait Distance (Feet): 60 Feet Assistive device: Rolling walker (2 wheeled) Gait Pattern/deviations: Step-through pattern Gait velocity: reduced Gait velocity interpretation: <1.8 ft/sec, indicate of risk for recurrent falls General Gait Details: pt with shortened step through gait, demonstrating R inattention with bumping into wall 3 times exclusively on R side during mobility   Stairs             Wheelchair Mobility    Modified Rankin (Stroke Patients Only) Modified Rankin (Stroke Patients Only) Pre-Morbid Rankin Score: Slight disability Modified Rankin: Moderately severe disability     Balance Overall balance assessment: Needs assistance Sitting-balance support: Single extremity supported;Feet supported Sitting balance-Leahy Scale: Fair Sitting balance - Comments: minG-close supervision at edge of bed   Standing balance support: Single extremity supported Standing balance-Leahy Scale: Fair Standing balance comment: minG with unilateral UE support of hand hold or RW                            Cognition Arousal/Alertness: Awake/alert Behavior During Therapy: WFL for tasks assessed/performed Overall Cognitive Status: Impaired/Different from baseline Area of Impairment: Safety/judgement;Awareness                         Safety/Judgement: Decreased awareness of deficits Awareness: Emergent          Exercises      General Comments General comments (skin integrity, edema, etc.): BP pre-mobility 133/80. Sitting at 131/70. After transfer to recliner BP of 115/80  and 110/80 at end of session. Pt initially reporting dizziness with sitting at edge of bed whic gradually reduces with further activity      Pertinent Vitals/Pain Pain Assessment: No/denies pain    Home Living                       Prior Function            PT Goals (current goals can now be found in the care plan section) Acute Rehab PT Goals Patient Stated Goal: To improve mobility and reduce dizziness Progress towards PT goals: Progressing toward goals    Frequency    Min 4X/week      PT Plan Current plan remains appropriate    Co-evaluation              AM-PAC PT "6 Clicks" Mobility   Outcome Measure  Help needed turning from your back to your side while in a flat bed without using bedrails?: A Little Help needed moving from lying on your back to sitting on the side of a flat bed without using bedrails?: A Little Help needed moving to and from a bed to a chair (including a wheelchair)?: A Little Help needed standing up from a chair using your arms (e.g., wheelchair or bedside chair)?: A Little Help needed to walk in hospital room?: A Little Help needed climbing 3-5 steps with a railing? : A Lot 6 Click Score: 17    End of Session   Activity Tolerance: Patient tolerated treatment well Patient left: in chair;with call bell/phone within reach;with chair alarm set Nurse Communication: Mobility status PT Visit Diagnosis: Unsteadiness on feet (R26.81);Other abnormalities of gait and mobility (R26.89);Muscle weakness (generalized) (M62.81);Other symptoms and signs involving the nervous system [id number])     Time: [id number] PT Time Calculation (min) (ACUTE ONLY): 30 min  Charges:  $Gait Training: 8-22 mins $Therapeutic Activity: 8-22 mins                     Zenaida Niece, PT, DPT Acute Rehabilitation Pager: [phone]    Zenaida Niece 04/11/2020, 9:26 AM

## 2020-04-11 NOTE — Discharge Summary (Addendum)
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 [facility])   [employer] (FIGO) 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[place])   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 ([place])   Dyslipidemia, goal LDL below 70   On amiodarone therapy   AF (paroxysmal atrial fibrillation) ([place])   Allergies as of 04/11/2020       Reactions   Clonidine Derivatives Shortness Of Breath   Coreg [carvedilol] Shortness Of Breath   Augmentin [amoxicillin-pot Clavulanate] Diarrhea   Caffeine Other (See Comments)   Makes heart race   Codeine Other (See Comments)   Does not like the feeling she gets   Doxycycline    "Explosive diarrhea"   Flexeril [cyclobenzaprine] Other (See Comments)   Pt states "increased heart rate"   Lipitor [atorvastatin] Dermatitis   "feels like bugs are biting her"    Lisinopril    LIP NUMBNESS   Pravastatin Other (See Comments)   FEELS LIKE "BUG BITES" BITING HER LEGS    Tape Rash   TEGADERM.   (use opsite on PAC)   Tegaderm Ag Mesh [silver] Rash, Other (See Comments)   Burns skin   Ultram [tramadol] Palpitations        Medication List     STOP taking these medications    lidocaine-prilocaine cream Commonly known as: EMLA   ondansetron 8 MG tablet Commonly known as: ZOFRAN   prochlorperazine 10 MG tablet Commonly known as: COMPAZINE       TAKE these medications    acetaminophen 500 MG tablet Commonly known as: TYLENOL Take 1,000 mg by mouth every 6 (six) hours as needed  for mild pain.   amiodarone 200 MG tablet Commonly known as: PACERONE Take 0.5 tablets (100 mg total) by mouth daily. If breakthrough afib. epsiode may take 400 mg twice a day until resolves then follow instructions   amLODipine 10 MG tablet Commonly known as: NORVASC Take 1 tablet (10 mg total) by mouth daily. Start taking on: Apr 12, 2020   Biotin 10 MG Tabs Take 20 mg by mouth daily.   bisacodyl 5 MG EC tablet Commonly known as: DULCOLAX Take 5 mg by mouth daily as needed for moderate constipation.   diphenhydrAMINE 25 MG tablet Commonly known as: BENADRYL Take 50 mg by mouth at bedtime as needed for sleep.   docusate sodium 100 MG capsule Commonly known as: COLACE Take 100 mg by mouth daily as needed for mild constipation.   ezetimibe 10 MG tablet Commonly known as: ZETIA Take 1 tablet (10 mg total) by mouth daily. What changed: when to take this   Glucosamine HCl 1000 MG Tabs Take 1,000 mg by mouth daily.   hydrochlorothiazide 12.5 MG capsule Commonly known as: MICROZIDE Take 1 capsule (12.5 mg total) by mouth daily. Start taking on: Apr 12, 2020   ICY HOT EX Apply 1 application topically 2 (two) times daily as needed (PAIN).   ipratropium 0.06 % nasal spray Commonly known as: ATROVENT Place 2 sprays into both nostrils  2 (two) times daily.   Melatonin 5 MG Caps Take 5 mg by mouth at bedtime as needed (sleep). Reported on 12/25/2015   multivitamin with minerals Tabs tablet Take 1 tablet by mouth daily. Centrum Silver   oxyCODONE 5 MG immediate release tablet Commonly known as: Oxy IR/ROXICODONE Take 5 mg by mouth 2 (two) times daily as needed for moderate pain.   potassium chloride 10 MEQ tablet Commonly known as: KLOR-CON Take 10 mEq by mouth daily.   Senexon-S 8.6-50 MG tablet Generic drug: senna-docusate Take 1 tablet by mouth daily as needed for mild constipation.   Synthroid 88 MCG tablet Generic drug: levothyroxine Take 88 mcg by mouth  daily.   SYSTANE OP Place 1 drop into both eyes 2 (two) times daily.        LABORATORY STUDIES CBC    Component Value Date/Time   WBC 6.6 04/09/2020 1939   RBC 3.43 (L) 04/09/2020 1939   HGB 11.2 (L) 04/09/2020 1940   HGB 10.8 (L) 04/16/2019 0948   HGB 11.6 12/20/2016 1214   HCT 33.0 (L) 04/09/2020 1940   HCT 33.6 (L) 12/20/2016 1214   PLT 170 04/09/2020 1939   PLT 122 (L) 04/16/2019 0948   PLT 140 (L) 12/20/2016 1214   MCV 101.7 (H) 04/09/2020 1939   MCV 100.5 12/20/2016 1214   MCH 32.7 04/09/2020 1939   MCHC 32.1 04/09/2020 1939   RDW 13.7 04/09/2020 1939   RDW 13.3 12/20/2016 1214   LYMPHSABS 1.9 04/09/2020 1939   LYMPHSABS 1.3 12/20/2016 1214   MONOABS 0.7 04/09/2020 1939   MONOABS 0.5 12/20/2016 1214   EOSABS 0.0 04/09/2020 1939   EOSABS 0.0 12/20/2016 1214   BASOSABS 0.0 04/09/2020 1939   BASOSABS 0.0 12/20/2016 1214   CMP    Component Value Date/Time   NA 138 04/09/2020 1940   NA 141 11/11/2017 1145   K 4.1 04/09/2020 1940   K 4.0 11/11/2017 1145   CL 100 04/09/2020 1940   CL 103 05/21/2013 1105   CO2 25 04/09/2020 1939   CO2 29 11/11/2017 1145   GLUCOSE 105 (H) 04/09/2020 1940   GLUCOSE 81 11/11/2017 1145   GLUCOSE 74 05/21/2013 1105   BUN 26 (H) 04/09/2020 1940   BUN 10.9 11/11/2017 1145   CREATININE 1.10 (H) 04/09/2020 1940   CREATININE 0.74 04/16/2019 0948   CREATININE 0.7 11/11/2017 1145   CALCIUM 9.4 04/09/2020 1939   CALCIUM 9.6 11/11/2017 1145   PROT 6.3 (L) 04/09/2020 1939   PROT 6.5 11/24/2016 1104   ALBUMIN 4.0 04/09/2020 1939   ALBUMIN 3.9 11/24/2016 1104   AST 24 04/09/2020 1939   AST 18 04/16/2019 0948   AST 18 11/24/2016 1104   ALT 14 04/09/2020 1939   ALT 14 04/16/2019 0948   ALT 15 11/24/2016 1104   ALKPHOS 72 04/09/2020 1939   ALKPHOS 104 11/24/2016 1104   BILITOT 0.5 04/09/2020 1939   BILITOT 0.4 04/16/2019 0948   BILITOT 0.57 11/24/2016 1104   GFRNONAA 42 (L) 04/09/2020 1939   GFRNONAA >60 04/16/2019 0948   GFRAA  49 (L) 04/09/2020 1939   GFRAA >60 04/16/2019 0948   COAGS Lab Results  Component Value Date   INR 1.0 04/09/2020   INR 1.08 12/04/2015   INR 0.97 04/21/2015   Lipid Panel    Component Value Date/Time   CHOL 215 (H) 04/10/2020 0937   CHOL 155 10/03/2018 0922   TRIG 93 04/11/2020 0500   HDL 70 04/10/2020 0937   HDL  84 10/03/2018 0922   CHOLHDL 3.1 04/10/2020 0937   VLDL 16 04/10/2020 0937   LDLCALC 129 (H) 04/10/2020 0937   LDLCALC 55 10/03/2018 0922   HgbA1C  Lab Results  Component Value Date   HGBA1C 5.2 04/10/2020   Urinalysis    Component Value Date/Time   COLORURINE STRAW (A) 04/09/2020 1937   APPEARANCEUR CLEAR 04/09/2020 1937   LABSPEC 1.008 04/09/2020 1937   LABSPEC 1.005 05/28/2015 1512   PHURINE 7.0 04/09/2020 1937   GLUCOSEU NEGATIVE 04/09/2020 1937   GLUCOSEU Negative 05/28/2015 1512   HGBUR NEGATIVE 04/09/2020 1937   BILIRUBINUR NEGATIVE 04/09/2020 1937   BILIRUBINUR Negative 05/28/2015 1512   KETONESUR NEGATIVE 04/09/2020 1937   PROTEINUR NEGATIVE 04/09/2020 1937   UROBILINOGEN 0.2 05/28/2015 1512   NITRITE NEGATIVE 04/09/2020 1937   LEUKOCYTESUR NEGATIVE 04/09/2020 1937   LEUKOCYTESUR Negative 05/28/2015 1512   Urine Drug Screen     Component Value Date/Time   LABOPIA NONE DETECTED 04/09/2020 1937   COCAINSCRNUR NONE DETECTED 04/09/2020 1937   LABBENZ NONE DETECTED 04/09/2020 1937   AMPHETMU NONE DETECTED 04/09/2020 1937   THCU NONE DETECTED 04/09/2020 1937   LABBARB NONE DETECTED 04/09/2020 1937    Alcohol Level    Component Value Date/Time   ETH <10 04/09/2020 1939     SIGNIFICANT DIAGNOSTIC STUDIES MR ANGIO HEAD WO CONTRAST  Result Date: 04/10/2020 CLINICAL DATA:  Stroke follow-up. Acute right-sided weakness, headache, and slurred speech. History of atrial fibrillation and endometrial cancer. Left thalamic hemorrhage on CT. EXAM: MRI HEAD WITHOUT AND WITH CONTRAST MRA HEAD WITHOUT CONTRAST TECHNIQUE: Multiplanar, multiecho pulse  sequences of the brain and surrounding structures were obtained without and with intravenous contrast. Angiographic images of the head were obtained using MRA technique without contrast. CONTRAST:  7.46mL GADAVIST GADOBUTROL 1 MMOL/ML IV SOLN COMPARISON:  Head CT 04/09/2020, MRI 12/07/2015, and MRA 02/10/2011 FINDINGS: MRI HEAD FINDINGS The study is mildly motion degraded. Brain: A left thalamic hemorrhage has not significantly changed in size from yesterday's head CT measuring 0.8 x 2.1 x 2.0 cm (AP x transverse x craniocaudal). Mild surrounding edema has slightly increased without midline shift or other significant mass effect. There is no intraventricular extension. There is minimal enhancement along the margins of the hematoma without nodular or masslike enhancement. Elsewhere, no acute infarct or abnormal enhancement is identified. There is no extra-axial fluid collection. A chronic microhemorrhage in the right parietal lobe was not evident on the prior MRI. Chronic microhemorrhages in the posterior left temporal lobe and left cerebellum on the prior MRI are not well seen on today's study which does not include susceptibility weighted imaging. Patchy T2 hyperintensities in the cerebral white matter bilaterally are stable to slightly increased from the prior MRI and are nonspecific but compatible with moderate chronic small vessel ischemic disease. There is mild to moderate cerebral atrophy. Vascular: Major intracranial vascular flow voids are preserved. Skull and upper cervical spine: Extraction. Trace unremarkable bone marrow signal. Sinuses/Orbits: Trace bilateral mastoid effusions. Clear paranasal sinuses. Other: None. MRA HEAD FINDINGS The visualized distal vertebral arteries are patent to the basilar with the left being moderately dominant. The left PICA and right AICA appear dominant. The SCAs are grossly patent proximally. The basilar artery is widely patent. There are large posterior communicating  arteries bilaterally with hypoplastic left and absent right P1 segments. Both PCAs are patent without evidence of significant proximal stenosis. The internal carotid arteries are widely patent from skull base to carotid termini. ACAs and MCAs are patent  without evidence of proximal branch occlusion or significant proximal stenosis. No aneurysm is identified. IMPRESSION: 1. Unchanged size of the left thalamic hemorrhage with mild surrounding edema. No intraventricular extension, significant mass effect, or underlying lesion identified. 2. Moderate chronic small vessel ischemic disease and cerebral atrophy. 3. Negative head MRA. Electronically Signed   By: Logan Bores M.D.   On: 04/10/2020 13:04   MR BRAIN W WO CONTRAST  Result Date: 04/10/2020 CLINICAL DATA:  Stroke follow-up. Acute right-sided weakness, headache, and slurred speech. History of atrial fibrillation and endometrial cancer. Left thalamic hemorrhage on CT. EXAM: MRI HEAD WITHOUT AND WITH CONTRAST MRA HEAD WITHOUT CONTRAST TECHNIQUE: Multiplanar, multiecho pulse sequences of the brain and surrounding structures were obtained without and with intravenous contrast. Angiographic images of the head were obtained using MRA technique without contrast. CONTRAST:  7.57mL GADAVIST GADOBUTROL 1 MMOL/ML IV SOLN COMPARISON:  Head CT 04/09/2020, MRI 12/07/2015, and MRA 02/10/2011 FINDINGS: MRI HEAD FINDINGS The study is mildly motion degraded. Brain: A left thalamic hemorrhage has not significantly changed in size from yesterday's head CT measuring 0.8 x 2.1 x 2.0 cm (AP x transverse x craniocaudal). Mild surrounding edema has slightly increased without midline shift or other significant mass effect. There is no intraventricular extension. There is minimal enhancement along the margins of the hematoma without nodular or masslike enhancement. Elsewhere, no acute infarct or abnormal enhancement is identified. There is no extra-axial fluid collection. A chronic  microhemorrhage in the right parietal lobe was not evident on the prior MRI. Chronic microhemorrhages in the posterior left temporal lobe and left cerebellum on the prior MRI are not well seen on today's study which does not include susceptibility weighted imaging. Patchy T2 hyperintensities in the cerebral white matter bilaterally are stable to slightly increased from the prior MRI and are nonspecific but compatible with moderate chronic small vessel ischemic disease. There is mild to moderate cerebral atrophy. Vascular: Major intracranial vascular flow voids are preserved. Skull and upper cervical spine: Extraction. Trace unremarkable bone marrow signal. Sinuses/Orbits: Trace bilateral mastoid effusions. Clear paranasal sinuses. Other: None. MRA HEAD FINDINGS The visualized distal vertebral arteries are patent to the basilar with the left being moderately dominant. The left PICA and right AICA appear dominant. The SCAs are grossly patent proximally. The basilar artery is widely patent. There are large posterior communicating arteries bilaterally with hypoplastic left and absent right P1 segments. Both PCAs are patent without evidence of significant proximal stenosis. The internal carotid arteries are widely patent from skull base to carotid termini. ACAs and MCAs are patent without evidence of proximal branch occlusion or significant proximal stenosis. No aneurysm is identified. IMPRESSION: 1. Unchanged size of the left thalamic hemorrhage with mild surrounding edema. No intraventricular extension, significant mass effect, or underlying lesion identified. 2. Moderate chronic small vessel ischemic disease and cerebral atrophy. 3. Negative head MRA. Electronically Signed   By: Logan Bores M.D.   On: 04/10/2020 13:04   US Guided Needle Placement - No Linked Charges  Result Date: 04/08/2020 Ultrasound-guided left hip injection: After sterile prep with Betadine, injected 8 cc 1% lidocaine without epinephrine and  40 mg methylprednisolone using a 22-gauge spinal needle, passing the needle through the iliofemoral ligament into the femoral head/neck junction.  Injectate was seen filling the joint capsule.   ECHOCARDIOGRAM COMPLETE  Result Date: 04/10/2020    ECHOCARDIOGRAM REPORT   Patient Name:   [name] Date of Exam: 04/10/2020 Medical Rec #:  [id number]  Height:       63.0 in Accession #:    [id number]        Weight:       133.2 lb Date of Birth:  September 05, 1941         BSA:          1.627 m Patient Age:    79 years          BP:           132/77 mmHg Patient Gender: F                 HR:           67 bpm. Exam Location:  Inpatient Procedure: 2D Echo, Cardiac Doppler and Color Doppler Indications:    CVA  History:        Patient has prior history of Echocardiogram examinations, most                 recent 05/17/2019. Previous Myocardial Infarction,                 Arrythmias:Atrial Fibrillation; Risk Factors:Hypertension and                 Dyslipidemia.  Sonographer:    Dustin Flock Referring Phys: [place]  1. Left ventricular ejection fraction, by estimation, is 65 to 70%. The left ventricle has normal function. The left ventricle has no regional wall motion abnormalities. There is mild concentric left ventricular hypertrophy. Left ventricular diastolic parameters are indeterminate.  2. Right ventricular systolic function is normal. The right ventricular size is normal. There is normal pulmonary artery systolic pressure.  3. Left atrial size was mildly dilated.  4. The mitral valve is degenerative. Mild mitral valve regurgitation. No evidence of mitral stenosis.  5. The aortic valve is bicuspid. Aortic valve regurgitation is not visualized. Mild aortic valve sclerosis is present, with no evidence of aortic valve stenosis. FINDINGS  Left Ventricle: Left ventricular ejection fraction, by estimation, is 65 to 70%. The left ventricle has normal function. The left ventricle has no  regional wall motion abnormalities. The left ventricular internal cavity size was normal in size. There is  mild concentric left ventricular hypertrophy. Left ventricular diastolic parameters are indeterminate. Right Ventricle: The right ventricular size is normal. No increase in right ventricular wall thickness. Right ventricular systolic function is normal. There is normal pulmonary artery systolic pressure. The tricuspid regurgitant velocity is 2.38 m/s, and  with an assumed right atrial pressure of 3 mmHg, the estimated right ventricular systolic pressure is [id number] mmHg. Left Atrium: Left atrial size was mildly dilated. Right Atrium: Right atrial size was normal in size. Pericardium: There is no evidence of pericardial effusion. Presence of pericardial fat pad. Mitral Valve: The mitral valve is degenerative in appearance. Moderate mitral annular calcification. Mild mitral valve regurgitation. No evidence of mitral valve stenosis. Tricuspid Valve: The tricuspid valve is normal in structure. Tricuspid valve regurgitation is mild . No evidence of tricuspid stenosis. Aortic Valve: The aortic valve is bicuspid. . There is mild thickening and mild calcification of the aortic valve. Aortic valve regurgitation is not visualized. Mild aortic valve sclerosis is present, with no evidence of aortic valve stenosis. There is mild thickening of the aortic valve. There is mild calcification of the aortic valve. Pulmonic Valve: The pulmonic valve was not well visualized. Pulmonic valve regurgitation is not visualized. No evidence of pulmonic stenosis. Aorta: The aortic root, ascending aorta and aortic arch  are all structurally normal, with no evidence of dilitation or obstruction. IAS/Shunts: The atrial septum is grossly normal.  LEFT VENTRICLE PLAX 2D LVIDd:         3.94 cm  Diastology LVIDs:         2.57 cm  LV e' lateral:   8.92 cm/s LV PW:         1.11 cm  LV E/e' lateral: 5.2 LV IVS:        1.20 cm  LV e' medial:    8.05 cm/s  LVOT diam:     2.00 cm  LV E/e' medial:  5.8 LV SV:         79 LV SV Index:   48 LVOT Area:     3.14 cm  RIGHT VENTRICLE RV Basal diam:  2.74 cm RV S prime:     11.90 cm/s TAPSE (M-mode): 3.3 cm LEFT ATRIUM             Index       RIGHT ATRIUM           Index LA diam:        3.20 cm 1.97 cm/m  RA Area:     13.20 cm LA Vol (A2C):   56.0 ml 34.43 ml/m RA Volume:   27.60 ml  16.97 ml/m LA Vol (A4C):   42.3 ml 26.00 ml/m LA Biplane Vol: 52.8 ml 32.46 ml/m  AORTIC VALVE LVOT Vmax:   104.00 cm/s LVOT Vmean:  76.400 cm/s LVOT VTI:    0.250 m  AORTA Ao Root diam: 3.00 cm MITRAL VALVE               TRICUSPID VALVE MV Area (PHT): 2.24 cm    TR Peak grad:   22.7 mmHg MV Decel Time: 338 msec    TR Vmax:        238.00 cm/s MV E velocity: 46.30 cm/s MV A velocity: 95.10 cm/s  SHUNTS MV E/A ratio:  0.49        Systemic VTI:  0.25 m                            Systemic Diam: 2.00 cm Buford Dresser MD Electronically signed by Buford Dresser MD Signature Date/Time: 04/10/2020/5:33:06 PM    Final    CT HEAD CODE STROKE WO CONTRAST  Result Date: 04/09/2020 CLINICAL DATA:  Code stroke. Neuro deficit, acute, stroke suspected. Additional history provided: Last known normal 18:00. Left-sided weakness/slurred speech. EXAM: CT HEAD WITHOUT CONTRAST TECHNIQUE: Contiguous axial images were obtained from the base of the skull through the vertex without intravenous contrast. COMPARISON:  Brain MRI 12/07/2015, head CT 11/26/2015 FINDINGS: Brain: Parenchymal hemorrhage centered within the left thalamocapsular junction measuring 1.0 x 2.1 x 2.7 cm (AP x TV x CC) there is mild surrounding edema. No intraventricular extension of hemorrhage at this time. No significant ventricular effacement. No midline shift. Redemonstrated ill-defined hypoattenuation within the cerebral white matter is nonspecific, but consistent with chronic small vessel ischemic disease. Stable, mild generalized parenchymal atrophy. No demarcated cortical  infarct. No extra-axial fluid collection. No evidence of intracranial mass. Vascular: No hyperdense vessel.  Atherosclerotic calcifications. Skull: Normal. Negative for fracture or focal lesion. Sinuses/Orbits: Visualized orbits show no acute finding. No significant paranasal sinus disease or mastoid effusion at the imaged levels. These results were called by telephone at the time of interpretation on 04/09/2020 at 7:57 pm to provider Dr. Lorraine Lax, who verbally  acknowledged these results. IMPRESSION: 1.0 x 2.1 x 2.7 cm acute parenchymal hemorrhage within the left thalamocapsular junction. This is a characteristic location for a hypertensive hemorrhage. Mild surrounding edema. No intraventricular extension of hemorrhage or significant mass effect at this time. Background generalized parenchymal atrophy and chronic small vessel ischemic disease. Electronically Signed   By: Kellie Simmering DO   On: 04/09/2020 19:52      HISTORY OF PRESENT ILLNESS [name] is a 79 y.o. female with past medical history significant for metastatic endometrial cancer, hypertension, hyperlipidemia, hypothyroidism, paroxysmal atrial fibrillation not on any anticoagulation presents to the emergency department with sudden onset right-sided weakness and headache associated with slurred speech.  She was brought into the emergency department as a code stroke. Last known well was around 6 PM on 04/09/2020.  Patient [facility] to [facility], ER-her blood pressure was 123XX123 systolic.  NIH stroke scale was 3-for right arm and right leg drift, reduced sensation.  Stat CT head was obtained which showed a left thalamic hemorrhage.  Patient and husband confirm she is no longer on anticoagulation.  INR and APTT within normal limits. Patient was started on Cleviprex drip for rapid blood pressure control. NIHSS: 3. Baseline MRS 0. Intracerebral Hemorrhage (ICH) Score:  0.    HOSPITAL COURSE [name] is a 79 y.o. female with history of  metastatic endometrial cancer currently under Hospice care, hypertension, hyperlipidemia, hypothyroidism, paroxysmal atrial fibrillation not on any anticoagulation presenting with R sided weakness and HA.    Stroke:   L thalamic hemorrhage most likely secondary to HTN  Code Stroke CT head L thalamocapsular jxn ICH. mild edema. Small vessel disease. Atrophy.  MRI w/w/o L thalamic hemorrhage w/ mild edema. No mets seen MRA  Unremarkable  2D Echo EF 65-70%. No source of embolus  LDL 129  HgbA1c 5.2  No antithrombotic prior to admission, now on No antithrombotic given ICH Therapy recommendations:  CIR -? Consider home health physical therpay, occupational therapy Disposition:   home w/ husband, resume Hospice   Hypertensive Emergency Home meds:  None listed BP as high as 178/100  Initially treated with IV CLeviprex  Added norvasc 10 and HCTZ 12.5  BP goal normotensive   Atrial Paroxsymal Fibrillation Home anticoagulation:  none  Hx GIB on Eliquis last summer which was stopped at that time On amiodarone PTA Not an [facility] candidate d/t ICH   Endometrial, Ovarian Cancer w/ mets to spleen & pelvis X 7 yrs Last chemo May 2020 On Hospice care since July 2020   Hyperlipidemia Home meds:  On zetia 10 LDL 129 Resume zetia at discharge   Other Stroke Risk Factors Advanced age Hx stroke/TIA 11/2015 - right posterior temporoparietal infarct embolic most likely as a result of recent cardiac cath. 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  Coronary artery disease, NSTEMI w/ cath 11/2015   Other Active Problems Hypothyroidism    DISCHARGE EXAM Blood pressure (S) (!) 161/83, pulse 76, temperature 98.4 F (36.9 C), temperature source Oral, resp. rate 17, height 5\' 3"  (1.6 m), weight 60.4 kg, SpO2 98 %. Pleasant elderly Caucasian lady not in distress. . Afebrile. Head is nontraumatic. Neck is supple without bruit.     Cardiac exam no murmur or gallop. Lungs are clear to auscultation. Distal pulses are well felt. Neurological Exam ;  Awake  Alert oriented x 3.  Slightly hesitant speech with only occasional word finding  difficulty and language.eye movements full without nystagmus.fundi were not visualized. Vision acuity and fields appear normal. Hearing is normal. Palatal movements are normal. Face asymmetric with slight flattening of the right nasolabial fold.. Tongue midline. Normal strength, tone, reflexes and coordination.  But diminished fine finger movements on the right and orbits left over right upper extremity.  Normal sensation. Gait deferred.  Discharge Diet   Heart healthy thin liquids  DISCHARGE PLAN Disposition:  Return home with husband Resume home Hospice care. Add PT if able Due to hemorrhage and risk of bleeding, do not take aspirin, aspirin-containing medications, or ibuprofen products  Ongoing stroke risk factor control by Primary Care Physician / Hospice Provider at time of discharge Follow-up PCP Shon Baton, MD in 2 weeks. Follow-up in [facility] in 4 weeks, office to schedule an appointment.   34 minutes were spent preparing discharge.  Burnetta Sabin, MSN, APRN, ANVP-BC, AGPCNP-BC Advanced Practice Stroke Nurse [place] for Schedule & Pager information 04/11/2020 2:22 PM   I have personally obtained history,examined this patient, reviewed notes, independently viewed imaging studies, participated in medical decision making and plan of care.ROS completed by me personally and pertinent positives fully documented  I have made any additions or clarifications directly to the above note. Agree with note above.   Antony Contras, MD Medical Director [facility] Pager: [phone] 04/11/2020 2:33 PM

## 2020-04-11 NOTE — Plan of Care (Signed)
  Problem: Education: Goal: Knowledge of disease or condition will improve Outcome: Progressing Goal: Knowledge of secondary prevention will improve Outcome: Progressing Goal: Knowledge of patient specific risk factors addressed and post discharge goals established will improve Outcome: Progressing Goal: Individualized Educational Video(s) Outcome: Progressing   Problem: Coping: Goal: Will verbalize positive feelings about self Outcome: Progressing Goal: Will identify appropriate support needs Outcome: Progressing   Problem: Health Behavior/Discharge Planning: Goal: Ability to manage health-related needs will improve Outcome: Progressing   Problem: Self-Care: Goal: Ability to participate in self-care as condition permits will improve Outcome: Progressing Goal: Verbalization of feelings and concerns over difficulty with self-care will improve Outcome: Progressing Goal: Ability to communicate needs accurately will improve Outcome: Progressing   Problem: Nutrition: Goal: Risk of aspiration will decrease Outcome: Progressing Goal: Dietary intake will improve Outcome: Progressing   Problem: Intracerebral Hemorrhage Tissue Perfusion: Goal: Complications of Intracerebral Hemorrhage will be minimized Outcome: Progressing   

## 2020-04-11 NOTE — Progress Notes (Signed)
Occupational Therapy Evaluation Patient Details Name: [name] MRN: [id number] DOB: 06-18-1941 Today's Date: 04/11/2020    History of Present Illness 79 yo female present with sudden onset R side weakness and HA with slurred speech.  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   Clinical Impression   PT admitted with L thalamic hemorrhage. Pt currently with functional limitiations due to the deficits listed below (see OT problem list). Pt will return home with spouse (A) and return to hospice. Spouse to call hospice for incontinence diapers and pads for home use.  Pt will benefit from skilled OT to increase their independence and safety with adls and balance to allow discharge home with family.     Follow Up Recommendations  No OT follow up(wants to return home with hospice)    Equipment Recommendations  None recommended by OT    Recommendations for Other Services       Precautions / Restrictions Precautions Precautions: Fall Precaution Comments: BP <140      Mobility Bed Mobility Overal bed mobility: Needs Assistance Bed Mobility: Supine to Sit;Sit to Supine     Supine to sit: Supervision Sit to supine: Min assist   General bed mobility comments: pt progressed to eob withotu (A) but upon return to bed requires blocking to preevnt falling off EOB by OT. pt very close to the edge and unable to scoot buttock without (A) to position  Transfers Overall transfer level: Needs assistance Equipment used: Rolling walker (2 wheeled) Transfers: Sit to/from Stand Sit to Stand: Min guard         General transfer comment: min (A) at times with RW for safety . pt reports fatigue from lack of sleep    Balance Overall balance assessment: Needs assistance         Standing balance support: During functional activity;Bilateral upper extremity supported Standing balance-Leahy  Scale: Poor                             ADL either performed or assessed with clinical judgement   ADL Overall ADL's : Needs assistance/impaired Eating/Feeding: Set up   Grooming: Applying deodorant   Upper Body Bathing: Set up   Lower Body Bathing: Minimal assistance   Upper Body Dressing : Set up   Lower Body Dressing: Minimal assistance   Toilet Transfer: Minimal assistance;RW           Functional mobility during ADLs: Minimal assistance;Rolling walker General ADL Comments: Spouse asking patient during session if she will be agreeable to use of RW at home as prior she was not as happy about the use of the RW. pt states yes thats okay     Vision Baseline Vision/History: Wears glasses Wears Glasses: At all times Additional Comments: noted to have some R inattention. pt states she has two lens from cataract surgery adn R eye is for far sight and L eye is for near     Perception     Praxis      Pertinent Vitals/Pain       Hand Dominance Right   Extremity/Trunk Assessment Upper Extremity Assessment Upper Extremity Assessment: Overall WFL for tasks assessed   Lower Extremity Assessment Lower Extremity Assessment: RLE deficits/detail RLE Deficits / Details: decrease sensation   Cervical / Trunk Assessment Cervical / Trunk Assessment: Kyphotic   Communication Communication Communication: No difficulties  Cognition Arousal/Alertness: Awake/alert Behavior During Therapy: WFL for tasks assessed/performed Overall Cognitive Status: Impaired/Different from baseline                                     General Comments  VSS    Exercises     Shoulder Instructions      Home Living Family/patient expects to be discharged to:: Private residence Living Arrangements: Spouse/significant other Available Help at Discharge: Family;Available 24 hours/day Type of Home: House Home Access: Stairs to enter [employer] of Steps:  5 Entrance Stairs-Rails: Left Home Layout: One level     Bathroom Shower/Tub: Teacher, early years/pre: Handicapped height Bathroom Accessibility: Yes How Accessible: Accessible via wheelchair Home Equipment: [employer] - 2 wheels;Cane - single point;Shower seat;Grab bars - tub/shower(grab bar on tub to get in)   Additional Comments: hospice RN 1 day per week and a case Freight forwarder. uses a light at night to go to master bedroom . recliner is electrical for head and feet rest is not a lift chair. spouse reports only needs a hand rail in master bedroom to make all handicap. Daughter that lives in town that works, REtired friends       Prior Functioning/Environment Level of Independence: Independent with assistive device(s)        Comments: ambulating with cane        OT Problem List: Decreased strength;Decreased activity tolerance;Impaired balance (sitting and/or standing);Decreased cognition;Decreased safety awareness;Decreased knowledge of use of DME or AE;Decreased knowledge of precautions      OT Treatment/Interventions: Self-care/ADL training;Therapeutic exercise;Neuromuscular education;Energy conservation;DME and/or AE instruction;Manual therapy;Modalities;Therapeutic activities;Cognitive remediation/compensation;Patient/family education;Balance training    OT Goals(Current goals can be found in the care plan section) Acute Rehab OT Goals Patient Stated Goal: to go home OT Goal Formulation: With patient/family Time For Goal Achievement: 04/25/20 Potential to Achieve Goals: Good  OT Frequency: Min 3X/week   Barriers to D/C:            Co-evaluation              AM-PAC OT "6 Clicks" Daily Activity     Outcome Measure Help from another person eating meals?: A Little Help from another person taking care of personal grooming?: A Little Help from another person toileting, which includes using toliet, bedpan, or urinal?: A Little Help from another person bathing  (including washing, rinsing, drying)?: A Little Help from another person to put on and taking off regular upper body clothing?: A Little Help from another person to put on and taking off regular lower body clothing?: A Little 6 Click Score: 18   End of Session Equipment Utilized During Treatment: Rolling walker Nurse Communication: Mobility status;Precautions  Activity Tolerance: Patient tolerated treatment well Patient left: in bed;with call bell/phone within reach;with bed alarm set;with family/visitor present  OT Visit Diagnosis: Unsteadiness on feet (R26.81);Muscle weakness (generalized) (M62.81)                Time:  -    Charges:      Fleeta Emmer, OTR/L  Acute Rehabilitation Services Pager: [phone] Office: [phone] .   Jeri Modena 04/11/2020, 1:34 PM

## 2020-04-11 NOTE — Progress Notes (Signed)
[facility]) Hospitalized Hospice Patient  Mrs. Jablonowski is a hospice patient with a terminal diagnosis of metastatic ovarian cancer per Dr. Karie Georges with [facility].  Pt collapsed at home with husband present, he activated EMS and pt was transported to [facility] ED.  She was admitted with intracerebral hemorrhage.  This is a related admission.  Visited pt and spouse at the bedside with [facility] manager to discuss d/c plans.  Pt alert and oriented, states she does not feel good, just mainly tired.  She is anxious to go home.  Discussed recommended home health with PT/OT but pt does not want to pursue this at this time. She advised she would like to go home and be in her own environment and continue with hospice services.    V/S: afebrile,  153/102, HR 79, RR 14, SPO2 98% on RA I&O:  855/2175 Labs & diagnostics:  None since 5/12 IVs/PRNs:  cleviprex infusion stopped @ 0940, tylenol 650 mg PO x 1  Problem List - ICH - hypertensive left thalamic ICH, pt mostly at baseline, alert and oriented, slightly delayed speech - HTN- start amlodipine & HCTZ per d/c instructions  D/C planning:  Pt would like to return home with hospice support GOC:  Clear, pt remains DNR and does not want any rehab at this time Family:  Updated at bedside IDT:  Updated, we will see her in the home tomorrow.  They have our contact information should they need something prior to this post hospital visit.  Thank you, Venia Carbon RN, BSN, [place] (in [place])

## 2020-04-11 NOTE — Progress Notes (Signed)
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  Patient's spouse activated EMS after patient nearly collapsed after complaining of sudden onset headache.  Patient was admitted to [facility] on 04/09/20 with a diagnosis of intracranial hemorrhage, with right sided deficits noted.   This RN visited patient and patient's spouse at bedside.  Report exchanged with hospital team.  Patient does not have any current symptoms that are not being managed by the hospital interventions.  Patient OOB to recliner, participated in discussion of events leading to hospitalization.  Some difficulty with word-finding noted.   Vital Signs:  126/77, 78 HR, 17 RR, 100% RA, pain 0/10 I&O's:  566.3/1725cc Abnormal Labs:  BUN 26         Creatinine  1.24          eGFR 42                    HBG 11.2 Diagnostics: IMAGING past 24 hours CT HEAD CODE STROKE WO CONTRAST   Result Date: 04/09/2020 CLINICAL DATA:  Code stroke. Neuro deficit, acute, stroke suspected. Additional history provided: Last known normal 18:00. Left-sided weakness/slurred speech. EXAM: CT HEAD WITHOUT CONTRAST TECHNIQUE: Contiguous axial images were obtained from the base of the skull through the vertex without intravenous contrast. COMPARISON:  Brain MRI 12/07/2015, head CT 11/26/2015 FINDINGS: Brain: Parenchymal hemorrhage centered within the left thalamocapsular junction measuring 1.0 x 2.1 x 2.7 cm (AP x TV x CC) there is mild surrounding edema. No intraventricular extension of hemorrhage at this time. No significant ventricular effacement. No midline shift. Redemonstrated ill-defined hypoattenuation within the cerebral white matter is nonspecific, but consistent with chronic small vessel ischemic disease. Stable, mild generalized parenchymal atrophy. No demarcated cortical infarct. No extra-axial fluid collection. No  evidence of intracranial mass. Vascular: No hyperdense vessel.  Atherosclerotic calcifications. Skull: Normal. Negative for fracture or focal lesion. Sinuses/Orbits: Visualized orbits show no acute finding. No significant paranasal sinus disease or mastoid effusion at the imaged levels. These results were called by telephone at the time of interpretation on 04/09/2020 at 7:57 pm to provider Dr. Lorraine Lax, who verbally acknowledged these results. IMPRESSION: 1.0 x 2.1 x 2.7 cm acute parenchymal hemorrhage within the left thalamocapsular junction. This is a characteristic location for a hypertensive hemorrhage. Mild surrounding edema. No intraventricular extension of hemorrhage or significant mass effect at this time. Background generalized parenchymal atrophy and chronic small vessel ischemic disease. Electronically Signed   By: Kellie Simmering DO   On: 04/09/2020 19:52     MRI/MRA IMPRESSION: 1. Unchanged size of the left thalamic hemorrhage with mild surrounding edema. No intraventricular extension, significant mass effect, or underlying lesion identified. 2. Moderate chronic small vessel ischemic disease and cerebral atrophy. 3. Negative head MRA.   IV/PRN Meds: Cleviprex Problem List:   Stroke:   L thalamic hemorrhage most likely secondary to HTN   Code Stroke CT head L thalamocapsular jxn ICH. mild edema. Small vessel disease. Atrophy.   MRI w/w/o L thalamic hemorrhage w/ mild edema. No mets seen  MRA  pending   2D Echo pending   LDL pending   HgbA1c pending   SCDs for VTE prophylaxis  No antithrombotic prior to admission, now on No antithrombotic given ICH  Therapy recommendations:  pending - OOB once MRI done/cleared  Disposition:  pending    Hypertensive Emergency  Home meds:  None listed  BP as high as 178/100   Initially treated with IV CLeviprex, off since 0400  Still > 160 this am  SBP goal < 140  Resume Cleviprex.   Add norvasc 5 and HCTZ 12.5  Long-term BP goal  normotensive   Discharge Planning:  Pt plans to dc home to continue under hospice care. Family Contact:  Spoke with spouse at bedside. No unmanaged needs or concerns at this time. IDT:  Updated Goals of Care:  Pt desires return to pre-stroke baseline.  DNR in place Should patient need ambulance transfer on discharge please use [facility] as they contract this service for our active hospice patients.  Domenic Moras, BSN, [facility] (in [place]) [phone] (24h on call)

## 2020-04-11 NOTE — TOC CAGE-AID Note (Signed)
Transition of Care [facility]) - CAGE-AID Screening   Patient Details  Name: [name] MRN: [id number] Date of Birth: Jan 24, 1941  Transition [facility]) CM/SW Contact:    Emeterio Reeve, [place] Phone Number: 04/11/2020, 2:48 PM   Clinical Narrative:  CSW met with pt at bedside. CSW introduced self and explained her role at the hospital. CSW inquired about alcohol use and substance use. Pt declined alcohol and substance use. NO education or resources where provided.  CAGE-AID Screening:    Have You Ever Felt You Ought to Cut Down on Your Drinking or Drug Use?: No Have People Annoyed You By Critizing Your Drinking Or Drug Use?: No Have You Felt Bad Or Guilty About Your Drinking Or Drug Use?: No Have You Ever Had a Drink or Used Drugs First Thing In The Morning to STeady Your Nerves or to Get Rid of a Hangover?: No CAGE-AID Score: 0        Blima Ledger, [place] Social Worker [phone]

## 2020-05-12 ENCOUNTER — Inpatient Hospital Stay: Payer: Medicare Other | Admitting: Adult Health

## 2020-05-27 ENCOUNTER — Other Ambulatory Visit: Payer: Self-pay | Admitting: Cardiology

## 2020-12-09 ENCOUNTER — Encounter (HOSPITAL_COMMUNITY): Payer: Self-pay

## 2020-12-09 ENCOUNTER — Emergency Department (HOSPITAL_COMMUNITY)

## 2020-12-09 ENCOUNTER — Inpatient Hospital Stay (HOSPITAL_COMMUNITY)
Admission: EM | Admit: 2020-12-09 | Discharge: 2020-12-10 | DRG: 389 | Disposition: A | Discharge status: Hospice | Attending: Internal Medicine | Admitting: Internal Medicine

## 2020-12-09 ENCOUNTER — Other Ambulatory Visit: Payer: Self-pay

## 2020-12-09 DIAGNOSIS — N1330 Unspecified hydronephrosis: Secondary | ICD-10-CM | POA: Diagnosis present

## 2020-12-09 DIAGNOSIS — I10 Essential (primary) hypertension: Secondary | ICD-10-CM | POA: Diagnosis present

## 2020-12-09 DIAGNOSIS — E039 Hypothyroidism, unspecified: Secondary | ICD-10-CM | POA: Diagnosis present

## 2020-12-09 DIAGNOSIS — H9193 Unspecified hearing loss, bilateral: Secondary | ICD-10-CM | POA: Diagnosis present

## 2020-12-09 DIAGNOSIS — Z8673 Personal history of transient ischemic attack (TIA), and cerebral infarction without residual deficits: Secondary | ICD-10-CM | POA: Diagnosis not present

## 2020-12-09 DIAGNOSIS — K56609 Unspecified intestinal obstruction, unspecified as to partial versus complete obstruction: Secondary | ICD-10-CM | POA: Diagnosis present

## 2020-12-09 DIAGNOSIS — C569 Malignant neoplasm of unspecified ovary: Secondary | ICD-10-CM | POA: Diagnosis present

## 2020-12-09 DIAGNOSIS — K5641 Fecal impaction: Secondary | ICD-10-CM | POA: Diagnosis present

## 2020-12-09 DIAGNOSIS — Z90710 Acquired absence of both cervix and uterus: Secondary | ICD-10-CM | POA: Diagnosis not present

## 2020-12-09 DIAGNOSIS — Z9221 Personal history of antineoplastic chemotherapy: Secondary | ICD-10-CM | POA: Diagnosis not present

## 2020-12-09 DIAGNOSIS — Z96641 Presence of right artificial hip joint: Secondary | ICD-10-CM | POA: Diagnosis present

## 2020-12-09 DIAGNOSIS — Z808 Family history of malignant neoplasm of other organs or systems: Secondary | ICD-10-CM

## 2020-12-09 DIAGNOSIS — Z79899 Other long term (current) drug therapy: Secondary | ICD-10-CM | POA: Diagnosis not present

## 2020-12-09 DIAGNOSIS — C541 Malignant neoplasm of endometrium: Secondary | ICD-10-CM | POA: Diagnosis present

## 2020-12-09 DIAGNOSIS — Z96652 Presence of left artificial knee joint: Secondary | ICD-10-CM | POA: Diagnosis present

## 2020-12-09 DIAGNOSIS — Z4659 Encounter for fitting and adjustment of other gastrointestinal appliance and device: Secondary | ICD-10-CM

## 2020-12-09 DIAGNOSIS — Z85828 Personal history of other malignant neoplasm of skin: Secondary | ICD-10-CM | POA: Diagnosis not present

## 2020-12-09 DIAGNOSIS — Z79890 Hormone replacement therapy (postmenopausal): Secondary | ICD-10-CM

## 2020-12-09 DIAGNOSIS — I252 Old myocardial infarction: Secondary | ICD-10-CM | POA: Diagnosis not present

## 2020-12-09 DIAGNOSIS — I480 Paroxysmal atrial fibrillation: Secondary | ICD-10-CM | POA: Diagnosis present

## 2020-12-09 DIAGNOSIS — Z881 Allergy status to other antibiotic agents status: Secondary | ICD-10-CM

## 2020-12-09 DIAGNOSIS — Z885 Allergy status to narcotic agent status: Secondary | ICD-10-CM

## 2020-12-09 DIAGNOSIS — Z20822 Contact with and (suspected) exposure to covid-19: Secondary | ICD-10-CM | POA: Diagnosis present

## 2020-12-09 DIAGNOSIS — Z91048 Other nonmedicinal substance allergy status: Secondary | ICD-10-CM

## 2020-12-09 DIAGNOSIS — Z8249 Family history of ischemic heart disease and other diseases of the circulatory system: Secondary | ICD-10-CM | POA: Diagnosis not present

## 2020-12-09 DIAGNOSIS — Z515 Encounter for palliative care: Secondary | ICD-10-CM | POA: Diagnosis not present

## 2020-12-09 DIAGNOSIS — Z66 Do not resuscitate: Secondary | ICD-10-CM | POA: Diagnosis present

## 2020-12-09 DIAGNOSIS — M25562 Pain in left knee: Secondary | ICD-10-CM | POA: Diagnosis present

## 2020-12-09 DIAGNOSIS — I2510 Atherosclerotic heart disease of native coronary artery without angina pectoris: Secondary | ICD-10-CM | POA: Diagnosis present

## 2020-12-09 LAB — CBC WITH DIFFERENTIAL/PLATELET
Abs Immature Granulocytes: 0.03 10*3/uL (ref 0.00–0.07)
Basophils Absolute: 0 10*3/uL (ref 0.0–0.1)
Basophils Relative: 0 %
Eosinophils Absolute: 0 10*3/uL (ref 0.0–0.5)
Eosinophils Relative: 0 %
HCT: 27.1 % — ABNORMAL LOW (ref 36.0–46.0)
Hemoglobin: 8.1 g/dL — ABNORMAL LOW (ref 12.0–15.0)
Immature Granulocytes: 0 %
Lymphocytes Relative: 13 %
Lymphs Abs: 1.2 10*3/uL (ref 0.7–4.0)
MCH: 25.1 pg — ABNORMAL LOW (ref 26.0–34.0)
MCHC: 29.9 g/dL — ABNORMAL LOW (ref 30.0–36.0)
MCV: 83.9 fL (ref 80.0–100.0)
Monocytes Absolute: 0.9 10*3/uL (ref 0.1–1.0)
Monocytes Relative: 10 %
Neutro Abs: 7 10*3/uL (ref 1.7–7.7)
Neutrophils Relative %: 77 %
Platelets: 298 10*3/uL (ref 150–400)
RBC: 3.23 MIL/uL — ABNORMAL LOW (ref 3.87–5.11)
RDW: 15.1 % (ref 11.5–15.5)
WBC: 9.2 10*3/uL (ref 4.0–10.5)
nRBC: 0 % (ref 0.0–0.2)

## 2020-12-09 LAB — COMPREHENSIVE METABOLIC PANEL
ALT: 15 U/L (ref 0–44)
AST: 25 U/L (ref 15–41)
Albumin: 3.7 g/dL (ref 3.5–5.0)
Alkaline Phosphatase: 74 U/L (ref 38–126)
Anion gap: 10 (ref 5–15)
BUN: 22 mg/dL (ref 8–23)
CO2: 25 mmol/L (ref 22–32)
Calcium: 8.9 mg/dL (ref 8.9–10.3)
Chloride: 93 mmol/L — ABNORMAL LOW (ref 98–111)
Creatinine, Ser: 1.18 mg/dL — ABNORMAL HIGH (ref 0.44–1.00)
GFR, Estimated: 48 mL/min — ABNORMAL LOW (ref >60)
Glucose, Bld: 109 mg/dL — ABNORMAL HIGH (ref 70–99)
Potassium: 4.3 mmol/L (ref 3.5–5.1)
Sodium: 128 mmol/L — ABNORMAL LOW (ref 135–145)
Total Bilirubin: 0.6 mg/dL (ref 0.3–1.2)
Total Protein: 6.3 g/dL — ABNORMAL LOW (ref 6.5–8.1)

## 2020-12-09 MED ORDER — LORAZEPAM 2 MG/ML IJ SOLN: 1.0000 mg | INTRAMUSCULAR | Status: DC | PRN

## 2020-12-09 MED ORDER — BIOTENE DRY MOUTH MT LIQD: 15.0000 mL | OROMUCOSAL | Status: DC | PRN

## 2020-12-09 MED ORDER — MORPHINE SULFATE (CONCENTRATE) 10 MG/0.5ML PO SOLN: 5.0000 mg | ORAL | Status: DC | PRN

## 2020-12-09 MED ORDER — MELATONIN 5 MG PO TABS
5.0000 mg | ORAL_TABLET | Freq: Every evening | ORAL | Status: DC | PRN
  Filled 2020-12-09: qty 1

## 2020-12-09 MED ORDER — MORPHINE SULFATE (PF) 2 MG/ML IV SOLN
1.0000 mg | INTRAVENOUS | Status: DC | PRN
Start: 2020-12-09 — End: 2020-12-10

## 2020-12-09 MED ORDER — ACETAMINOPHEN 325 MG PO TABS
650.0000 mg | ORAL_TABLET | Freq: Four times a day (QID) | ORAL | Status: DC | PRN
  Administered 2020-12-09 – 2020-12-10 (×2): 650 mg via ORAL
  Filled 2020-12-09 (×2): qty 2

## 2020-12-09 MED ORDER — LIDOCAINE HCL URETHRAL/MUCOSAL 2 % EX GEL
1.0000 "application " | Freq: Once | CUTANEOUS | Status: AC
  Administered 2020-12-09: 1
  Filled 2020-12-09: qty 11

## 2020-12-09 MED ORDER — PROCHLORPERAZINE EDISYLATE 10 MG/2ML IJ SOLN
10.0000 mg | Freq: Four times a day (QID) | INTRAMUSCULAR | Status: DC | PRN
  Filled 2020-12-09: qty 2

## 2020-12-09 MED ORDER — POLYVINYL ALCOHOL 1.4 % OP SOLN
1.0000 [drp] | Freq: Four times a day (QID) | OPHTHALMIC | Status: DC | PRN
Start: 2020-12-09 — End: 2020-12-10

## 2020-12-09 MED ORDER — GLYCOPYRROLATE 0.2 MG/ML IJ SOLN: 0.2000 mg | INTRAMUSCULAR | Status: DC | PRN

## 2020-12-09 MED ORDER — LORAZEPAM 2 MG/ML PO CONC
1.0000 mg | ORAL | Status: DC | PRN
  Filled 2020-12-09: qty 0.5

## 2020-12-09 MED ORDER — LIDOCAINE HCL (CARDIAC) PF 100 MG/5ML IV SOSY
PREFILLED_SYRINGE | INTRAVENOUS | Status: AC
  Filled 2020-12-09: qty 5

## 2020-12-09 MED ORDER — EZETIMIBE 10 MG PO TABS
10.0000 mg | ORAL_TABLET | Freq: Every day | ORAL | Status: DC
  Administered 2020-12-09: 10 mg via ORAL
  Filled 2020-12-09: qty 1

## 2020-12-09 MED ORDER — ACETAMINOPHEN 650 MG RE SUPP: 650.0000 mg | Freq: Four times a day (QID) | RECTAL | Status: DC | PRN

## 2020-12-09 MED ORDER — AMIODARONE HCL 200 MG PO TABS
100.0000 mg | ORAL_TABLET | Freq: Every day | ORAL | Status: DC
Start: 2020-12-10 — End: 2020-12-10
  Administered 2020-12-10: 100 mg via ORAL
  Filled 2020-12-09 (×2): qty 1

## 2020-12-09 MED ORDER — HALOPERIDOL LACTATE 2 MG/ML PO CONC
0.5000 mg | ORAL | Status: DC | PRN
  Filled 2020-12-09: qty 0.3

## 2020-12-09 MED ORDER — LORAZEPAM 1 MG PO TABS: 1.0000 mg | ORAL_TABLET | ORAL | Status: DC | PRN

## 2020-12-09 MED ORDER — LEVOTHYROXINE SODIUM 88 MCG PO TABS
88.0000 ug | ORAL_TABLET | Freq: Every day | ORAL | Status: DC
  Administered 2020-12-10: 88 ug via ORAL
  Filled 2020-12-09: qty 1

## 2020-12-09 MED ORDER — MAGIC MOUTHWASH
15.0000 mL | Freq: Four times a day (QID) | ORAL | Status: DC | PRN
  Filled 2020-12-09: qty 15

## 2020-12-09 MED ORDER — HALOPERIDOL 0.5 MG PO TABS: 0.5000 mg | ORAL_TABLET | ORAL | Status: DC | PRN

## 2020-12-09 MED ORDER — HALOPERIDOL LACTATE 5 MG/ML IJ SOLN: 0.5000 mg | INTRAMUSCULAR | Status: DC | PRN

## 2020-12-09 MED ORDER — GLYCOPYRROLATE 1 MG PO TABS
1.0000 mg | ORAL_TABLET | ORAL | Status: DC | PRN
  Filled 2020-12-09: qty 1

## 2020-12-09 NOTE — Progress Notes (Signed)
[facility])      This patient is currently a hospice patient with [facility], admitted onto service in June 2020 with a terminal diagnosis of ovarian cancer. Pt has had multiple nursing visits in past two days to address distended abdomen and nausea.  Pt tolerated saline enema with only air as result.  Per [facility] nurse abdomen more distended today with pt overall weaker and in more pain.    [facility] will continue to follow for any discharge planning needs and to coordinate continuation of hospice care.    Thank you for the opportunity to participate in this patient's care.     Domenic Moras, BSN, RN [facility] Liaison   [phone] (24h on call)

## 2020-12-09 NOTE — ED Provider Notes (Signed)
Patient care assumed at 1500. Patient with history of metastatic ovarian cancer on hospice here for evaluation of abdominal distention and pain. Plan for placement of nasogastric tube for decompression with a discharge home. After NG tube placement patient with persistent distention and abdominal pain, only small amount of liquid returned through NG tube. Will obtain CT abdomen pelvis to rule out a more distal obstruction. Physical Exam  BP (!) 143/84   Pulse 79   Temp 98.2 F (36.8 C) (Oral)   Resp 17   Ht 5\' 3"  (1.6 m)   Wt 58.1 kg   SpO2 96%   BMI 22.67 kg/m   Physical Exam  ED Course/Procedures   Clinical Course as of 12/09/20 1844  Tue Dec 09, 2020  1419 Rectal exam with hardened stool high in rectal vault, abdomen distended and nontender, abdominal XR completed  [MS]  1420 XR concerning for bowel obstruction.Patient agreeable to NG tube and if improved, would like to manage palliative as outpatient with current hospice team. Will forego abdominal CT given hospice status and patient preference for limited intervention  [MS]    Clinical Course User Index [MS] Simmons-Robinson, Makiera, MD    Procedures  MDM  CT scan with distal obstruction. Do not feel additional enemas will be of any benefit for this patient. She continues to be uncomfortable despite NG tube being placed to wall suction. Do not feel that her symptoms will be well managed at home. Discussed with palliative care team, inpatient hospice but is not available at this time. Inpatient hospice bed is not available at this time. Hospitalist consulted formation for ongoing symptomatic treatment.      Quintella Reichert, MD 12/09/20 2205

## 2020-12-09 NOTE — H&P (Signed)
History and Physical    [name] OIN:[id number] DOB: 05-Dec-1940 DOA: 12/09/2020  PCP: Shon Baton, MD  Patient coming from: Home  I have personally briefly reviewed patient's old medical records in [place]  Chief Complaint: Abdominal distention, constipation  HPI: [name] is a 80 y.o. female with medical history significant for metastatic ovarian and endometrial cancer currently active with hospice, paroxysmal atrial fibrillation not on Eliquis due to history of GI bleed, nontraumatic left thalamic intracranial hemorrhage, CAD, hypertension, hyperlipidemia, hypothyroidism who presents to the ED for evaluation of abdominal distention and lack of bowel movement x5 days.  Patient states she has been having progressive abdominal distention over the last 5 days.  She has not had a bowel movement during the same time.  She follows with hospice and home health nursing daily saline enema without bowel movement or significant improvement in distention.  Patient was advised to present to the ED for further evaluation and management.  Patient states she has intermittent nausea and vomiting for which she takes Compazine with relief at home.  She says she is not currently having any abdominal pain.  Husband is at bedside and both patient and husband are clear that focus should remain on comfort with plan to transfer to [facility] inpatient hospice once a bed is available.  ED Course:  Initial vitals showed BP 140/84, pulse 72, RR 18, temp 98.3 F, SPO2 99% on room air.  Labs show sodium 128, potassium 4.3, bicarb 25, BUN 22, creatinine 1.18, serum glucose 109, LFTs within normal limits, WBC 9.2, hemoglobin 8.1, platelets 298,000.  Abdominal x-ray showed severe gaseous distention of the colon and possibly small bowel suggestive of bowel obstruction.  NG tube was placed via IR however patient had persistent distention and abdominal pain.  Follow up CT abdomen/pelvis showed  diffuse distention of the colon with large volume stool.  Findings suggestive of localized mass at the rectum was noted as potential source of obstruction.  Moderate left hydronephrosis likely due to mass-effect on the left ureter from stool volume also seen.  EDP discussed with patient and palliative care team for hospice placement however inpatient hospice bed is not available.  Due to ongoing uncontrolled abdominal pain and focus to move towards comfort care and the hospitalist service was consulted to admit for further management.  Review of Systems: All systems reviewed and are negative except as documented in history of present illness above.   Past Medical History:  Diagnosis Date  . Arthritis    OSTEOARTHRITIS   -- CONSTANT PAIN RIGHT HIP---AND PAIN LEFT KNEE--PT STATES SHE GETS INJECTIONS INTO HER KNEE  . Complication of anesthesia    BLOOD PRESSURE DROPPED WITH NASAL SURGERY, ONE OF THE CARPAL TUNNEL REPAIRS AND DURING A COLONOSCOPY  . Dyslipidemia   . History of skin cancer   . Hypertension   . Hypothyroidism   . Menopausal symptoms   . NSTEMI (non-ST elevated myocardial infarction) ([place]) 12/09/15   Medical management: Distal branch of D1 95%, ostial D2 75%, distal LAD 50%. Tortuous arteries consistent with hypertension  . Ovarian cancer ([place]) 01/2013   Recurrence since 2014/2016  . PAF (paroxysmal atrial fibrillation) ([place])    On ELIQUIS; On Amiodarone (Eye Exam 12/25/15) - no longer on Rythmol  . Stroke [facility]) 2017   no deficits    Past Surgical History:  Procedure Laterality Date  . BILATERAL CARPAL TUNNEL REPAIR  2007  . BREAST BIOPSY Left 03/19/2014   benign  .  CARDIAC CATHETERIZATION N/A 12/05/2015   Procedure: Left Heart Cath and Coronary Angiography;  Surgeon: Belva Crome, MD;  Location: [facility];  Service: Cardiovascular;  distal branch of D1 95%, ostial D2 75%, dLAD 50%,p-mRCA 40%. Tortuous vessels.  Marland Kitchen DILATION AND CURETTAGE OF UTERUS  1969  .  ESOPHAGOGASTRODUODENOSCOPY (EGD) WITH PROPOFOL N/A 09/22/2019   Procedure: ESOPHAGOGASTRODUODENOSCOPY (EGD) WITH PROPOFOL;  Surgeon: Milus Banister, MD;  Location: [facility];  Service: Endoscopy;  Laterality: N/A;  . JOINT REPLACEMENT    . LAPAROTOMY Bilateral 02/13/2013   Procedure: EXPLORATORY LAPAROTOMY TOTAL ABDOMINAL HYSTERECTOMY BILATERAL SALPINGO-OOPHORECTOMY, Partial Rectal Resection with Reanastamosis;  Surgeon: Alvino Chapel, MD;  Location: [facility];  Service: Gynecology;  Laterality: Bilateral;  . LEFT KNEE ARTHROSCOPY   2011  . LYMPHADENECTOMY Right 02/13/2013   Procedure: PEVLIC  LYMPHADENECTOMY, DEBULKING right pelvic tumor nodules;  Surgeon: Alvino Chapel, MD;  Location: [facility];  Service: Gynecology;  Laterality: Right;  . NM MYOVIEW LTD  June 2010    subbmaximal with no ischemia or infarction.  . OMENTECTOMY  02/13/2013   Procedure: OMENTECTOMY;  Surgeon: Alvino Chapel, MD;  Location: [facility];  Service: Gynecology;;  . [employer]  . SURGERY FOR RUPTURED OVARIAN CYST  1969  . SYNOVECTOMY WITH POLY EXCHANGE Left 03/18/2017   Procedure: LEFT KNEE PARTIAL SYNOVECTOMY WITH POLY EXCHANGE;  Surgeon: Mcarthur Rossetti, MD;  Location: [facility];  Service: Orthopedics;  Laterality: Left;  Adductor Block  . TAH/BSO/Tumor debulking with right pelvic LND  01/2013  . TONSILLECTOMY  1962  . TOTAL HIP ARTHROPLASTY  02/25/2012   Procedure: TOTAL HIP ARTHROPLASTY ANTERIOR APPROACH;  Surgeon: Mcarthur Rossetti, MD;  Location: [facility];  Service: Orthopedics;  Laterality: Right;  . TOTAL KNEE ARTHROPLASTY Left 01/03/2015   Procedure: LEFT TOTAL KNEE ARTHROPLASTY;  Surgeon: Mcarthur Rossetti, MD;  Location: [facility];  Service: Orthopedics;  Laterality: Left;  . TRANSTHORACIC ECHOCARDIOGRAM  May 2014; January 2016   a. Normal LV size function. EF 60-65%. Grade 1 diastolic function. Mild MR and mildly elevated PA pressures of 37 mmHg per;; b.  EF 60-65%. Mobile echodensity 10 mm x 6 mm attest interventricular septum is questionable fibroblastoma.  Marland Kitchen TRIGGER FINGER RELEASE Left 12/20/2017   Procedure: RELEASE TRIGGER FINGER/A-1 PULLEY LEFT RING FINGER;  Surgeon: Daryll Brod, MD;  Location: [facility];  Service: Orthopedics;  Laterality: Left;    Social History:  reports that she has never smoked. She has never used smokeless tobacco. She reports that she does not drink alcohol and does not use drugs.  Allergies  Allergen Reactions  . Clonidine Derivatives Shortness Of Breath  . Coreg [Carvedilol] Shortness Of Breath  . Augmentin [Amoxicillin-Pot Clavulanate] Diarrhea  . Caffeine Other (See Comments)    Makes heart race  . Codeine Other (See Comments)    Does not like the feeling she gets  . Doxycycline     "Explosive diarrhea"  . Flexeril [Cyclobenzaprine] Other (See Comments)    Pt states "increased heart rate"  . Lipitor [Atorvastatin] Dermatitis    "feels like bugs are biting her"   . Lisinopril     LIP NUMBNESS  . Pravastatin Other (See Comments)    FEELS LIKE "BUG BITES" BITING HER LEGS   . Tape Rash    TEGADERM.   (use opsite on PAC)  . Tegaderm Ag Mesh [Silver] Rash and Other (See Comments)    Burns skin  . Ultram [  Tramadol] Palpitations    Family History  Problem Relation Age of Onset  . Hypertension Mother   . Basal cell carcinoma Mother   . AAA (abdominal aortic aneurysm) Father   . Heart Problems Maternal Uncle   . Heart Problems Maternal Grandfather   . AAA (abdominal aortic aneurysm) Paternal Grandmother   . Prostate cancer Maternal Uncle 70  . Prostate cancer Other   . Other Daughter        one daughter had TAH-BSO at 33; other daughter will have one soon     Prior to Admission medications   Medication Sig Start Date End Date Taking? Authorizing Provider  acetaminophen (TYLENOL) 500 MG tablet Take 1,000 mg by mouth every 6 (six) hours as needed for mild pain.    Yes [provider]  amiodarone (PACERONE) 200 MG tablet PLEASE SEE ATTACHED FOR DETAILED DIRECTIONS Patient taking differently: Take 100 mg by mouth daily. 05/27/20  Yes Leonie Man, MD  Biotin 10 MG TABS Take 10 mg by mouth daily.   Yes [provider]  [facility] GAS RELIEF 125 MG CAPS Take 1 capsule by mouth daily as needed (flatulance). 11/23/20  Yes [provider]  [facility] PAIN RELIEF REGULAR ST 325 MG tablet Take 325 mg by mouth every 6 (six) hours as needed for mild pain. 07/10/20  Yes [provider]  diphenhydrAMINE (BENADRYL) 25 MG tablet Take 50 mg by mouth at bedtime as needed for sleep.    Yes [provider]  ezetimibe (ZETIA) 10 MG tablet Take 1 tablet (10 mg total) by mouth daily. Patient taking differently: Take 10 mg by mouth at bedtime. 02/24/16  Yes Leonie Man, MD  Glucosamine HCl 1000 MG TABS Take 1,000 mg by mouth daily.   Yes [provider]  ipratropium (ATROVENT) 0.06 % nasal spray Place 2 sprays into both nostrils 2 (two) times daily.  02/16/16  Yes [provider]  Melatonin 5 MG CAPS Take 5 mg by mouth at bedtime as needed (sleep).   Yes [provider]  Menthol, Topical Analgesic, (ICY HOT EX) Apply 1 application topically 2 (two) times daily as needed (PAIN).    Yes [provider]  Multiple Vitamin (MULTIVITAMIN WITH MINERALS) TABS tablet Take 1 tablet by mouth daily. Centrum Silver   Yes [provider]  oxyCODONE (OXY IR/ROXICODONE) 5 MG immediate release tablet Take 5 mg by mouth daily as needed for moderate pain. 09/25/19  Yes [provider]  Polyethyl Glycol-Propyl Glycol (SYSTANE OP) Place 1 drop into both eyes 2 (two) times daily.    Yes [provider]  potassium chloride (K-DUR) 10 MEQ tablet Take 10 mEq by mouth daily. 03/05/19  Yes [provider]  prochlorperazine (COMPAZINE) 10 MG tablet Take 10 mg by mouth every 6 (six) hours as needed for nausea or  vomiting.   Yes [provider]  SYNTHROID 88 MCG tablet Take 88 mcg by mouth daily. 05/06/19  Yes [provider]  amLODipine (NORVASC) 10 MG tablet Take 1 tablet (10 mg total) by mouth daily. Patient not taking: No sig reported 04/12/20   Donzetta Starch, NP  hydrochlorothiazide (MICROZIDE) 12.5 MG capsule Take 1 capsule (12.5 mg total) by mouth daily. Patient not taking: No sig reported 04/12/20   Donzetta Starch, NP    Physical Exam: Vitals:   12/09/20 1416 12/09/20 1426 12/09/20 1500 12/09/20 1900  BP: 132/73  (!) 143/84 (!) 169/82  Pulse:  83 79 75  Resp:   17 18  Temp:      TempSrc:      SpO2: 98% 98% 96% 97%  Weight:      Height:       Constitutional: Resting supine in bed, NAD, calm, comfortable Eyes: PERRL, lids and conjunctivae normal ENMT: NG tube in place right naris with approximately 100 cc dark red output.  Mucous membranes are moist. Posterior pharynx clear of any exudate or lesions.Normal dentition.  Neck: normal, supple, no masses. Respiratory: clear to auscultation bilaterally, no wheezing, no crackles. Normal respiratory effort. No accessory muscle use.  Cardiovascular: Regular rate and rhythm, no murmurs / rubs / gallops.  +1 RLE edema, trace LLE edema. Abdomen: Large distended abdomen. Bowel sounds positive.  Musculoskeletal: no clubbing / cyanosis. No joint deformity upper and lower extremities. Good ROM, no contractures. Normal muscle tone.  Skin: no rashes, lesions, ulcers. No induration Neurologic: CN 2-12 grossly intact. Sensation intact, Strength 5/5 in all 4.  Psychiatric: Normal judgment and insight. Alert and oriented x 3. Normal mood.   Labs on Admission: I have personally reviewed following labs and imaging studies  CBC: Recent Labs  Lab 12/09/20 1440  WBC 9.2  NEUTROABS 7.0  HGB 8.1*  HCT 27.1*  MCV 83.9  PLT [id number]   Basic Metabolic Panel: Recent Labs  Lab 12/09/20 1440  NA 128*  K 4.3  CL 93*  CO2 25  GLUCOSE 109*   BUN 22  CREATININE 1.18*  CALCIUM 8.9   GFR: Estimated Creatinine Clearance: 33 mL/min (A) (by C-G formula based on SCr of 1.18 mg/dL (H)). Liver Function Tests: Recent Labs  Lab 12/09/20 1440  AST 25  ALT 15  ALKPHOS 74  BILITOT 0.6  PROT 6.3*  ALBUMIN 3.7   No results for input(s): LIPASE, AMYLASE in the last 168 hours. No results for input(s): AMMONIA in the last 168 hours. Coagulation Profile: No results for input(s): INR, PROTIME in the last 168 hours. Cardiac Enzymes: No results for input(s): CKTOTAL, CKMB, CKMBINDEX, TROPONINI in the last 168 hours. BNP (last 3 results) No results for input(s): PROBNP in the last 8760 hours. HbA1C: No results for input(s): HGBA1C in the last 72 hours. CBG: No results for input(s): GLUCAP in the last 168 hours. Lipid Profile: No results for input(s): CHOL, HDL, LDLCALC, TRIG, CHOLHDL, LDLDIRECT in the last 72 hours. Thyroid Function Tests: No results for input(s): TSH, T4TOTAL, FREET4, T3FREE, THYROIDAB in the last 72 hours. Anemia Panel: No results for input(s): VITAMINB12, FOLATE, FERRITIN, TIBC, IRON, RETICCTPCT in the last 72 hours. Urine analysis:    Component Value Date/Time   COLORURINE STRAW (A) 04/09/2020 1937   APPEARANCEUR CLEAR 04/09/2020 1937   LABSPEC 1.008 04/09/2020 1937   LABSPEC 1.005 05/28/2015 1512   PHURINE 7.0 04/09/2020 1937   GLUCOSEU NEGATIVE 04/09/2020 1937   GLUCOSEU Negative 05/28/2015 1512   HGBUR NEGATIVE 04/09/2020 1937   BILIRUBINUR NEGATIVE 04/09/2020 1937   BILIRUBINUR Negative 05/28/2015 1512   KETONESUR NEGATIVE 04/09/2020 1937   PROTEINUR NEGATIVE 04/09/2020 1937   UROBILINOGEN 0.2 05/28/2015 1512   NITRITE NEGATIVE 04/09/2020 1937   LEUKOCYTESUR NEGATIVE 04/09/2020 1937   LEUKOCYTESUR Negative 05/28/2015 1512    Radiological Exams on Admission: CT Abdomen Pelvis Wo Contrast  Result Date: 12/09/2020 CLINICAL DATA:  Abdominal distension EXAM: CT ABDOMEN AND PELVIS WITHOUT CONTRAST  TECHNIQUE: Multidetector CT imaging of the abdomen and pelvis was performed following the standard protocol without IV contrast. COMPARISON:  CT abdomen pelvis 05/17/2019 FINDINGS: LOWER  CHEST: Small pleural effusions with bibasilar atelectasis. HEPATOBILIARY: Normal hepatic contours. No intra- or extrahepatic biliary dilatation. Vicarious excretion contrast within the gallbladder. PANCREAS: Normal pancreas. No ductal dilatation or peripancreatic fluid collection. SPLEEN: Hypodense lesion in the spleen measures 2.4 cm. ADRENALS/URINARY TRACT: The adrenal glands are normal. Moderate left hydronephrosis. The urinary bladder is normal for degree of distention STOMACH/BOWEL: Nasogastric tube tip is within the decompressed gastric lumen. There is diffuse distension of the colon with large volume stool. The distal rectum is decompressed with moderate wall thickening. The small bowel is not dilated. VASCULAR/LYMPHATIC: There is calcific atherosclerosis of the abdominal aorta. Multiple para-aortic lymph nodes, poorly characterized without intravenous contrast. REPRODUCTIVE: Status post hysterectomy. No adnexal mass. MUSCULOSKELETAL. Right total hip arthroplasty. Multilevel facet arthrosis. No acute lesion. OTHER: None. IMPRESSION: 1. Diffuse distension of the colon with large volume stool. Wall thickening of the rectum is concerning for a localized mass (either primary or a serosal metastatic implant), potential source of obstruction. 2. Moderate left hydronephrosis, likely due to mass effect on the left ureter by the tremendous stool volume. 3. Para-aortic lymphadenopathy, limited visualization due to lack of intravenous contrast, but similar to 05/17/2019. Previously demonstrated peritoneal metastatic implants are difficult to visualize due to lack of contrast and anatomic distortion from colonic distension. 4. Small pleural effusions with bibasilar atelectasis. 5. Indeterminate hypodense left splenic lesion, possibly  metastatic. Aortic Atherosclerosis (ICD10-I70.0). Electronically Signed   By: Ulyses Jarred M.D.   On: 12/09/2020 19:47   DG Abdomen Acute W/Chest  Result Date: 12/09/2020 CLINICAL DATA:  80 year old female with constipation, evaluate for obstruction. EXAM: DG ABDOMEN ACUTE WITH 1 VIEW CHEST COMPARISON:  CT abdomen pelvis from 05/17/2019 FINDINGS: Multiple dilated loops of air-filled bowel. Degree of distension limits evaluation, however most bowel loops appeared to be large bowel, some suggestion of a few gaseous distended loops of small bowel in the mid abdomen. No evidence of pneumoperitoneum. Right IJ port in place with the tip near the cavoatrial junction. Bibasilar subsegmental atelectasis. Stable mild cardiomegaly. No acute osseous abnormality. IMPRESSION: Severe gaseous distension of the colon and possibly the small bowel, suggestive of bowel obstruction. Recommend CT abdomen pelvis for further evaluation. Bibasilar subsegmental atelectasis with stable mild cardiomegaly. Electronically Signed   By: Ruthann Cancer MD   On: 12/09/2020 14:25   DG Addison Bailey G Tube Plc W/Fl W/Rad  Result Date: 12/09/2020 CLINICAL DATA:  Nasogastric tube placement for bowel obstruction EXAM: NASO G TUBE PLACEMENT WITH FL AND WITH RAD CONTRAST:  None FLUOROSCOPY TIME:  Fluoroscopy Time:  24 seconds Radiation Exposure Index (if provided by the fluoroscopic device): 4.9 mGy Number of Acquired Spot Images: 1 COMPARISON:  Same day abdominal radiograph. FINDINGS: Nasogastric tube with tip and side port overlying the stomach. Similar gaseous dilation of the visualized upper abdominal loops of bowel. IMPRESSION: Nasogastric tube with tip and side port overlying the stomach. Persistent bowel obstruction. Electronically Signed   By: Dahlia Bailiff MD   On: 12/09/2020 17:51    EKG: Not performed.  Assessment/Plan Principal Problem:   Bowel obstruction (HCC) Active Problems:   Hypothyroidism   Recurrent carcinoma of ovary (HCC)    AF (paroxysmal atrial fibrillation) (HCC)  [name] is a 80 y.o. female with medical history significant for metastatic ovarian and endometrial cancer currently active with hospice, paroxysmal atrial fibrillation not on Eliquis due to history of GI bleed, nontraumatic left thalamic intracranial hemorrhage, CAD, hypertension, hyperlipidemia, hypothyroidism who is admitted for comfort care measures in the setting  of severe colonic bowel obstruction due to metastatic rectal mass.  Severe colonic bowel obstruction due to metastatic rectal mass Metastatic ovarian and endometrial cancer with known metastases to spleen and pelvis Active with hospice now on comfort care measures only: -NG tube in place with minimal output -Pain control with Tylenol, oral and IV morphine as needed -Continue antiemetics as needed -TOC consult for inpatient hospice placement -CODE STATUS is DNR  Paroxysmal atrial fibrillation Hypothyroidism Hyperlipidemia: Patient wishes to continue her home medications at this time.  Continue amiodarone, Zetia, Synthroid, melatonin for now.  DVT prophylaxis: None Code Status: DNR Family Communication: Discussed with patient spouse at bedside Disposition Plan: From home, anticipate transfer to [place] called: None Admission status:  Status is: Inpatient  Remains inpatient appropriate because:Ongoing active pain requiring inpatient pain management   Dispo: The patient is from: Home              Anticipated d/c is to: Inpatient hospice              Anticipated d/c date is: 2 days              Patient currently is not medically stable to d/c.   Zada Finders MD [facility]  If 7PM-7AM, please contact night-coverage [email or web address]  12/09/2020, 11:00 PM

## 2020-12-09 NOTE — ED Provider Notes (Signed)
[facility] DEPT Provider Note   CSN: [id number] Arrival date & time: 12/09/20  1141     History Chief Complaint  Patient presents with  . abdominal distention     [name] is a 80 y.o. female.  Patient presents with progressive abdominal distention and constipation with her last bowel movement 4 days ago. Patient and husband report that she has not had oxycodone recently due to onset of constipation. Patient denies presence of pain. Her hospice nurse tried an enema yesterday with little output. She reports some nausea earlier today that was relieved with antinausea medication. Patient has not had emesis since distention progressed.         Past Medical History:  Diagnosis Date  . Arthritis    OSTEOARTHRITIS   -- CONSTANT PAIN RIGHT HIP---AND PAIN LEFT KNEE--PT STATES SHE GETS INJECTIONS INTO HER KNEE  . Complication of anesthesia    BLOOD PRESSURE DROPPED WITH NASAL SURGERY, ONE OF THE CARPAL TUNNEL REPAIRS AND DURING A COLONOSCOPY  . Dyslipidemia   . History of skin cancer   . Hypertension   . Hypothyroidism   . Menopausal symptoms   . NSTEMI (non-ST elevated myocardial infarction) ([place]) 12/09/15   Medical management: Distal branch of D1 95%, ostial D2 75%, distal LAD 50%. Tortuous arteries consistent with hypertension  . Ovarian cancer ([place]) 01/2013   Recurrence since 2014/2016  . PAF (paroxysmal atrial fibrillation) ([place])    On ELIQUIS; On Amiodarone (Eye Exam 12/25/15) - no longer on Rythmol  . Stroke [facility]) 2017   no deficits    Patient Active Problem List   Diagnosis Date Noted  . ICH (intracerebral hemorrhage) ([place]) - Hypertensive L thalamic ICH 04/09/2020  . Acute GI bleeding 09/21/2019  . Symptomatic anemia 09/21/2019  . AF (paroxysmal atrial fibrillation) ([place])   . Vertigo 01/30/2019  . Chemotherapy-induced nausea 08/15/2018  . Pancytopenia, acquired ([place]) 05/02/2018  . Goals of care, counseling/discussion 04/03/2018   . Swan-neck deformity of finger, right 02/01/2018  . Chronic right-sided low back pain without sciatica 08/29/2017  . Closed nondisplaced fracture of head of right radius 08/15/2017  . Osteoarthritis of finger of left hand 07/04/2017  . Pain in finger of left hand 07/04/2017  . Trigger ring finger of left hand 07/04/2017  . Failed total left knee replacement ([place]) 03/18/2017  . Status post revision of total knee replacement, left 03/18/2017  . Thrombocytopenia ([place]) 03/27/2016  . High risk medications (not anticoagulants) long-term use 03/27/2016  . Chronic anticoagulation 03/27/2016  . Atherosclerotic heart disease of native coronary artery without angina pectoris 03/03/2016  . Bilateral hearing loss 02/16/2016  . Bilateral impacted cerumen 02/16/2016  . Rhinitis, chronic 02/16/2016  . Embolic stroke ([place]) 76/19/5093  . Vaginal atrophy 12/29/2015  . On amiodarone therapy 12/23/2015  . Dyslipidemia, goal LDL below 70   . Stroke with cerebral ischemia ([place])   . Visual disturbance   . Cerebral thrombosis with cerebral infarction 12/08/2015  . NSTEMI (non-ST elevated myocardial infarction) ([place]) 12/04/2015  . 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 Chemotherapy-induced peripheral neuropathy ([place]) 07/23/2015  . Portacath in place 07/23/2015  . Recurrent carcinoma of ovary ([place]) 07/23/2015  . [employer] Federation of Gyne[employer] and Obstetrics (FIGO) 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[place]) 07/23/2015  . Genetic testing 07/14/2015  . Encounter for antineoplastic chemotherapy 06/25/2015  . Port catheter in place 06/25/2015  . Antineoplastic chemotherapy induced pancytopenia ([place]) 06/02/2015  . Dehydration 06/02/2015  .  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 Chemotherapy induced neutropenia ([place]) 05/13/2015  . Chemotherapy induced thrombocytopenia 05/13/2015  . Hyperbilirubinemia 05/06/2015  . Osteoarthritis of left knee 01/03/2015   . Status post total left knee replacement 01/03/2015  . PAF (paroxysmal atrial fibrillation) ([place]): Symptomatic - Rhythm control with Amiodarone. CHA2DS2Vasc Score 7: On Eliquis   . Fever 05/03/2013    Class: Acute  . Nausea 05/03/2013    Class: Acute  . Other constipation 05/03/2013    Class: Acute  . Syncope, history of 04/19/2013  . Essential hypertension 04/19/2013  . Hypothyroidism 04/19/2013  . Ovarian cancer on right ([place]) 03/24/2013  . Degenerative arthritis of hip 02/25/2012    Past Surgical History:  Procedure Laterality Date  . BILATERAL CARPAL TUNNEL REPAIR  2007  . BREAST BIOPSY Left 03/19/2014   benign  . CARDIAC CATHETERIZATION N/A 12/05/2015   Procedure: Left Heart Cath and Coronary Angiography;  Surgeon: Belva Crome, MD;  Location: [facility];  Service: Cardiovascular;  distal branch of D1 95%, ostial D2 75%, dLAD 50%,p-mRCA 40%. Tortuous vessels.  Marland Kitchen DILATION AND CURETTAGE OF UTERUS  1969  . ESOPHAGOGASTRODUODENOSCOPY (EGD) WITH PROPOFOL N/A 09/22/2019   Procedure: ESOPHAGOGASTRODUODENOSCOPY (EGD) WITH PROPOFOL;  Surgeon: Milus Banister, MD;  Location: [facility];  Service: Endoscopy;  Laterality: N/A;  . JOINT REPLACEMENT    . LAPAROTOMY Bilateral 02/13/2013   Procedure: EXPLORATORY LAPAROTOMY TOTAL ABDOMINAL HYSTERECTOMY BILATERAL SALPINGO-OOPHORECTOMY, Partial Rectal Resection with Reanastamosis;  Surgeon: Alvino Chapel, MD;  Location: [facility];  Service: Gynecology;  Laterality: Bilateral;  . LEFT KNEE ARTHROSCOPY   2011  . LYMPHADENECTOMY Right 02/13/2013   Procedure: PEVLIC  LYMPHADENECTOMY, DEBULKING right pelvic tumor nodules;  Surgeon: Alvino Chapel, MD;  Location: [facility];  Service: Gynecology;  Laterality: Right;  . NM MYOVIEW LTD  June 2010    subbmaximal with no ischemia or infarction.  . OMENTECTOMY  02/13/2013   Procedure: OMENTECTOMY;  Surgeon: Alvino Chapel, MD;  Location: [facility];  Service: Gynecology;;  .  May  . SURGERY FOR RUPTURED OVARIAN CYST  1969  . SYNOVECTOMY WITH POLY EXCHANGE Left 03/18/2017   Procedure: LEFT KNEE PARTIAL SYNOVECTOMY WITH POLY EXCHANGE;  Surgeon: Mcarthur Rossetti, MD;  Location: [facility];  Service: Orthopedics;  Laterality: Left;  Adductor Block  . TAH/BSO/Tumor debulking with right pelvic LND  01/2013  . TONSILLECTOMY  1962  . TOTAL HIP ARTHROPLASTY  02/25/2012   Procedure: TOTAL HIP ARTHROPLASTY ANTERIOR APPROACH;  Surgeon: Mcarthur Rossetti, MD;  Location: [facility];  Service: Orthopedics;  Laterality: Right;  . TOTAL KNEE ARTHROPLASTY Left 01/03/2015   Procedure: LEFT TOTAL KNEE ARTHROPLASTY;  Surgeon: Mcarthur Rossetti, MD;  Location: [facility];  Service: Orthopedics;  Laterality: Left;  . TRANSTHORACIC ECHOCARDIOGRAM  May 2014; January 2016   a. Normal LV size function. EF 60-65%. Grade 1 diastolic function. Mild MR and mildly elevated PA pressures of 37 mmHg per;; b. EF 60-65%. Mobile echodensity 10 mm x 6 mm attest interventricular septum is questionable fibroblastoma.  Marland Kitchen TRIGGER FINGER RELEASE Left 12/20/2017   Procedure: RELEASE TRIGGER FINGER/A-1 PULLEY LEFT RING FINGER;  Surgeon: Daryll Brod, MD;  Location: [facility];  Service: Orthopedics;  Laterality: Left;     OB History    Gravida  3   Para  2   Term      Preterm      AB  1   Living  3     SAB  1   IAB      Ectopic      Multiple  1   Live Births              Family History  Problem Relation Age of Onset  . Hypertension Mother   . Basal cell carcinoma Mother   . AAA (abdominal aortic aneurysm) Father   . Heart Problems Maternal Uncle   . Heart Problems Maternal Grandfather   . AAA (abdominal aortic aneurysm) Paternal Grandmother   . Prostate cancer Maternal Uncle 70  . Prostate cancer Other   . Other Daughter        one daughter had TAH-BSO at 94; other daughter will have one soon    Social History   Tobacco Use  .  Smoking status: Never Smoker  . Smokeless tobacco: Never Used  Vaping Use  . Vaping Use: Never used  Substance Use Topics  . Alcohol use: No    Alcohol/week: 0.0 standard drinks  . Drug use: No    Home Medications Prior to Admission medications   Medication Sig Start Date End Date Taking? Authorizing Provider  acetaminophen (TYLENOL) 500 MG tablet Take 1,000 mg by mouth every 6 (six) hours as needed for mild pain.    Yes [provider]  amiodarone (PACERONE) 200 MG tablet PLEASE SEE ATTACHED FOR DETAILED DIRECTIONS Patient taking differently: Take 100 mg by mouth daily. 05/27/20  Yes Leonie Man, MD  Biotin 10 MG TABS Take 10 mg by mouth daily.   Yes [provider]  [employer] GAS RELIEF 125 MG CAPS Take 1 capsule by mouth daily as needed (flatulance). 11/23/20  Yes [provider]  [employer] PAIN RELIEF REGULAR ST 325 MG tablet Take 325 mg by mouth every 6 (six) hours as needed for mild pain. 07/10/20  Yes [provider]  diphenhydrAMINE (BENADRYL) 25 MG tablet Take 50 mg by mouth at bedtime as needed for sleep.    Yes [provider]  ezetimibe (ZETIA) 10 MG tablet Take 1 tablet (10 mg total) by mouth daily. Patient taking differently: Take 10 mg by mouth at bedtime. 02/24/16  Yes Leonie Man, MD  Glucosamine HCl 1000 MG TABS Take 1,000 mg by mouth daily.   Yes [provider]  ipratropium (ATROVENT) 0.06 % nasal spray Place 2 sprays into both nostrils 2 (two) times daily.  02/16/16  Yes [provider]  Melatonin 5 MG CAPS Take 5 mg by mouth at bedtime as needed (sleep).   Yes [provider]  Menthol, Topical Analgesic, (ICY HOT EX) Apply 1 application topically 2 (two) times daily as needed (PAIN).    Yes [provider]  Multiple Vitamin (MULTIVITAMIN WITH MINERALS) TABS tablet Take 1 tablet by mouth daily. Centrum Silver   Yes [provider]  oxyCODONE (OXY IR/ROXICODONE) 5 MG immediate release  tablet Take 5 mg by mouth daily as needed for moderate pain. 09/25/19  Yes [provider]  Polyethyl Glycol-Propyl Glycol (SYSTANE OP) Place 1 drop into both eyes 2 (two) times daily.    Yes [provider]  potassium chloride (K-DUR) 10 MEQ tablet Take 10 mEq by mouth daily. 03/05/19  Yes [provider]  prochlorperazine (COMPAZINE) 10 MG tablet Take 10 mg by mouth every 6 (six) hours as needed for nausea or vomiting.   Yes [provider]  SYNTHROID 88 MCG tablet Take 88 mcg by mouth daily. 05/06/19  Yes  [provider]  amLODipine (NORVASC) 10 MG tablet Take 1 tablet (10 mg total) by mouth daily. Patient not taking: No sig reported 04/12/20   Donzetta Starch, NP  hydrochlorothiazide (MICROZIDE) 12.5 MG capsule Take 1 capsule (12.5 mg total) by mouth daily. Patient not taking: No sig reported 04/12/20   Donzetta Starch, NP    Allergies    Clonidine derivatives, Coreg [carvedilol], Augmentin [amoxicillin-pot clavulanate], Caffeine, Codeine, Doxycycline, Flexeril [cyclobenzaprine], Lipitor [atorvastatin], Lisinopril, Pravastatin, Tape, Tegaderm ag mesh [silver], and Ultram [tramadol]  Review of Systems   Review of Systems  Gastrointestinal: Positive for abdominal distention, constipation and nausea. Negative for abdominal pain and vomiting.    Physical Exam Updated Vital Signs BP (!) 143/84   Pulse 79   Temp 98.2 F (36.8 C) (Oral)   Resp 17   Ht 5\' 3"  (1.6 m)   Wt 58.1 kg   SpO2 96%   BMI 22.67 kg/m   Physical Exam Constitutional:      General: She is not in acute distress.    Appearance: Normal appearance. She is not toxic-appearing or diaphoretic.  Cardiovascular:     Rate and Rhythm: Normal rate and regular rhythm.  Abdominal:     General: There is distension.     Palpations: There is mass.     Tenderness: There is no abdominal tenderness.     Comments: Tympanic bowel sounds heard throughout all four quadrants   Genitourinary:     Comments: Rectal exam with chaperone present, Hardened stool high in rectal vault, skin tags externally, no bleeding observed, no anal fissure Musculoskeletal:     Comments: BLE, 1+  Neurological:     Mental Status: She is alert.     ED Results / Procedures / Treatments   Labs (all labs ordered are listed, but only abnormal results are displayed) Labs Reviewed  COMPREHENSIVE METABOLIC PANEL  CBC WITH DIFFERENTIAL/PLATELET    EKG None  Radiology DG Abdomen Acute W/Chest  Result Date: 12/09/2020 CLINICAL DATA:  80 year old female with constipation, evaluate for obstruction. EXAM: DG ABDOMEN ACUTE WITH 1 VIEW CHEST COMPARISON:  CT abdomen pelvis from 05/17/2019 FINDINGS: Multiple dilated loops of air-filled bowel. Degree of distension limits evaluation, however most bowel loops appeared to be large bowel, some suggestion of a few gaseous distended loops of small bowel in the mid abdomen. No evidence of pneumoperitoneum. Right IJ port in place with the tip near the cavoatrial junction. Bibasilar subsegmental atelectasis. Stable mild cardiomegaly. No acute osseous abnormality. IMPRESSION: Severe gaseous distension of the colon and possibly the small bowel, suggestive of bowel obstruction. Recommend CT abdomen pelvis for further evaluation. Bibasilar subsegmental atelectasis with stable mild cardiomegaly. Electronically Signed   By: Ruthann Cancer MD   On: 12/09/2020 14:25    Procedures Procedures (including critical care time)  Medications Ordered in ED Medications  lidocaine (cardiac) 100 mg/29mL (XYLOCAINE) 100 MG/5ML injection 2% (has no administration in time range)  lidocaine (XYLOCAINE) 2 % jelly 1 application (1 application Other Given 12/09/20 1526)    ED Course  I have reviewed the triage vital signs and the nursing notes.  Pertinent labs & imaging results that were available during my care of the patient were reviewed by me and considered in my medical decision making (see  chart for details).  Clinical Course as of 12/09/20 1536  Tue Dec 09, 2020  1419 Rectal exam with hardened stool high in rectal vault, abdomen distended and nontender, abdominal XR completed  [MS]  1420  XR concerning for bowel obstruction.Patient agreeable to NG tube and if improved, would like to manage palliative as outpatient with current hospice team. Will forego abdominal CT given hospice status and patient preference for limited intervention  [MS]    Clinical Course User Index [MS] Simmons-Robinson, Riki Sheer, MD   MDM Rules/Calculators/A&P                          [name] is a 80 year old female with ongoing hospice care in setting of metastatic ovarian cancer. Patient on chronic opioids for palliative management of pain. She has not had opioids since onset of constipation in last few days. Given degree of distention, patient likely experiencing opioid induced constipation with fecal impaction vs obstruction 2/2 to metastatic disease. Abdominal xray with concern for SBO and follow up abdominal and pelvic CT recommended. Given patient's hospice status, discussed case with palliative care givers with ultimate plan to insert NG tube for decompression and with improved comfort, NG tube could be managed as outpatient. NG tube insertion was challenging so will discuss potential IR insertion.   Final Clinical Impression(s) / ED Diagnoses Final diagnoses:  Encounter for nasogastric (NG) tube placement  Fecal impaction [facility])    Rx / DC Orders ED Discharge Orders    None       Eulis Foster, MD 12/09/20 [place], Wonda Olds, MD 12/09/20 1737

## 2020-12-09 NOTE — ED Triage Notes (Signed)
Per ems: Pt coming from home c/o abdominal distention and no bowel movement x5 days per hospice nurse. Hx ovarian cancer. Denies abdominal pain. Receiving tylenol and compazine. Enema yesterday with no success.   144/78 94 RA  84 HR 18 RR 160 CBG

## 2020-12-09 NOTE — ED Notes (Signed)
Patient transported to X-ray 

## 2020-12-10 ENCOUNTER — Encounter (HOSPITAL_COMMUNITY): Payer: Self-pay | Admitting: Internal Medicine

## 2020-12-10 DIAGNOSIS — K5641 Fecal impaction: Principal | ICD-10-CM

## 2020-12-10 LAB — SARS CORONAVIRUS 2 (TAT 6-24 HRS): SARS Coronavirus 2: NEGATIVE

## 2020-12-10 MED ORDER — POLYETHYLENE GLYCOL 3350 17 G PO PACK: 17.0000 g | PACK | Freq: Two times a day (BID) | ORAL | Status: DC

## 2020-12-10 MED ORDER — PROCHLORPERAZINE MALEATE 10 MG PO TABS
10.0000 mg | ORAL_TABLET | Freq: Four times a day (QID) | ORAL | Status: DC | PRN
  Administered 2020-12-10: 10 mg via ORAL
  Filled 2020-12-10: qty 1

## 2020-12-10 NOTE — ED Notes (Signed)
Called report to Sami, RN 

## 2020-12-10 NOTE — TOC Transition Note (Signed)
Transition of Care [facility]) - CM/SW Discharge Note   Patient Details  Name: [name] MRN: [id number] Date of Birth: 09/28/1941  [facility]) CM/SW Contact:  Lynnell Catalan, RN Phone Number: 12/10/2020, 12:34 PM   Clinical Narrative:     Pt to dc to [facility] today. RN to call report to  [phone]. [facility] contacted for transport. Yellow DNR on chart for DC.

## 2020-12-10 NOTE — Progress Notes (Signed)
PROGRESS NOTE    [name]  JIR:[id number] DOB: [birth year] DOA: 12/09/2020 PCP: Shon Baton, MD   Chief Complain: Abdominal distention , constipation  Brief Narrative: Patient is a 80 year old female with history of metastatic ovarian/endometrial cancer currently on hospice at home, but recently fever, coronary artery disease, hypertension, hyperlipidemia, hypothyroidism who presents to the emergency department for the evaluation of abdominal distention, constipation.  She reported progressive abdominal distention for last 5 days.  Denies any abdominal pain, nausea or vomiting.  Patient and family interested on transfer to residential hospice.  [facility]ing for bed at [facility].  Comfort care has been started.  Assessment & Plan:   Principal Problem:   Bowel obstruction (HCC) Active Problems:   Hypothyroidism   Recurrent carcinoma of ovary (HCC)   AF (paroxysmal atrial fibrillation) (HCC)   Severe colonic bowel obstruction secondary to metastatic ovarian/endometrial cancer: Currently on hospice care at home.  Previously following with oncology.  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  NG tube was placed in the emergency department which has came out spontaneously.  Will not put another NG tube.  Continue pain management, antiemetics, bowel regimen. Started on full comfort care.  Plan is to transfer her to [facility]  Paroxysmal A. Fib/hypothyroidism/hyperlipidemia: Patient wanted to continue home medications at this time.  On amiodarone, Zetia, Synthyroid, melatonin at home         DVT prophylaxis:None Code Status: Comfort care Family Communication: daughter at bedside Status is: Inpatient  Remains inpatient appropriate because:Inpatient level of care appropriate due to severity of illness   Dispo: The patient is from: Home              Anticipated d/c is to: residential hospice              Anticipated d/c date is: 1 day              Patient currently is  hemodynamically  stable for dc to residential hospcie    Consultants: None  Procedures:None  Antimicrobials:  Anti-infectives (From admission, onward)   None      Subjective: Patient seen and examined at bedside this morning.  Has severe abdominal distention but hemodynamically stable.  Denies any nausea or vomiting.  NG tube came out spontaneously.  Does not want any IV access or reinsertion of NG tube.  Started on regular diet  Objective: Vitals:   12/09/20 1900 12/10/20 0320 12/10/20 0732 12/10/20 0844  BP: (!) 169/82 (!) 155/85 (!) 154/112 (!) 163/88  Pulse: 75 88 80 79  Resp: 18 16 15 15   Temp:    98.9 F (37.2 C)  TempSrc:    Oral  SpO2: 97% 93% 94% 96%  Weight:    60.3 kg  Height:       No intake or output data in the 24 hours ending 12/10/20 1050 Filed Weights   12/09/20 1155 12/10/20 0844  Weight: 58.1 kg 60.3 kg    Examination:  General exam: Chronically ill looking Respiratory system: Bilateral equal air entry, normal vesicular breath sounds, no wheezes or crackles  Cardiovascular system: S1 & S2 heard, RRR. No JVD, murmurs, rubs, gallops or clicks. No pedal edema. Gastrointestinal system: Abdomen is severely distended. Central nervous system: Alert and oriented. No focal neurological deficits. Extremities: No edema, no clubbing ,no cyanosis Skin: No rashes, lesions or ulcers,no icterus ,no pallor  Data Reviewed: I have personally reviewed following labs and imaging studies  CBC: Recent Labs  Lab 12/09/20 1440  WBC  9.2  NEUTROABS 7.0  HGB 8.1*  HCT 27.1*  MCV 83.9  PLT [id number]   Basic Metabolic Panel: Recent Labs  Lab 12/09/20 1440  NA 128*  K 4.3  CL 93*  CO2 25  GLUCOSE 109*  BUN 22  CREATININE 1.18*  CALCIUM 8.9   GFR: Estimated Creatinine Clearance: 33 mL/min (A) (by C-G formula based on SCr of 1.18 mg/dL (H)). Liver Function Tests: Recent Labs  Lab 12/09/20 1440  AST 25  ALT 15  ALKPHOS 74  BILITOT 0.6  PROT 6.3*  ALBUMIN  3.7   No results for input(s): LIPASE, AMYLASE in the last 168 hours. No results for input(s): AMMONIA in the last 168 hours. Coagulation Profile: No results for input(s): INR, PROTIME in the last 168 hours. Cardiac Enzymes: No results for input(s): CKTOTAL, CKMB, CKMBINDEX, TROPONINI in the last 168 hours. BNP (last 3 results) No results for input(s): PROBNP in the last 8760 hours. HbA1C: No results for input(s): HGBA1C in the last 72 hours. CBG: No results for input(s): GLUCAP in the last 168 hours. Lipid Profile: No results for input(s): CHOL, HDL, LDLCALC, TRIG, CHOLHDL, LDLDIRECT in the last 72 hours. Thyroid Function Tests: No results for input(s): TSH, T4TOTAL, FREET4, T3FREE, THYROIDAB in the last 72 hours. Anemia Panel: No results for input(s): VITAMINB12, FOLATE, FERRITIN, TIBC, IRON, RETICCTPCT in the last 72 hours. Sepsis Labs: No results for input(s): PROCALCITON, LATICACIDVEN in the last 168 hours.  Recent Results (from the past 240 hour(s))  SARS CORONAVIRUS 2 (TAT 6-24 HRS) Nasopharyngeal Nasopharyngeal Swab     Status: None   Collection Time: 12/09/20 11:00 PM   Specimen: Nasopharyngeal Swab  Result Value Ref Range Status   SARS Coronavirus 2 NEGATIVE NEGATIVE Final    Comment: (NOTE) SARS-CoV-2 target nucleic acids are NOT DETECTED.  The SARS-CoV-2 RNA is generally detectable in upper and lower respiratory specimens during the acute phase of infection. Negative results do not preclude SARS-CoV-2 infection, do not rule out co-infections with other pathogens, and should not be used as the sole basis for treatment or other patient management decisions. Negative results must be combined with clinical observations, patient history, and epidemiological information. The expected result is Negative.  Fact Sheet for Patients: SugarRoll.be  Fact Sheet for Healthcare Providers: https://www.woods-mathews.com/  This test is  not yet approved or cleared by the [place] FDA and  has been authorized for detection and/or diagnosis of SARS-CoV-2 by FDA under an Emergency Use Authorization (EUA). This EUA will remain  in effect (meaning this test can be used) for the duration of the COVID-19 declaration under Se ction 564(b)(1) of the Act, 21 U.S.C. section 360bbb-3(b)(1), unless the authorization is terminated or revoked sooner.  Performed at [facility], [place] [address], [place], [place] [address]          Radiology Studies: CT Abdomen Pelvis Wo Contrast  Result Date: 12/09/2020 CLINICAL DATA:  Abdominal distension EXAM: CT ABDOMEN AND PELVIS WITHOUT CONTRAST TECHNIQUE: Multidetector CT imaging of the abdomen and pelvis was performed following the standard protocol without IV contrast. COMPARISON:  CT abdomen pelvis 05/17/2019 FINDINGS: LOWER CHEST: Small pleural effusions with bibasilar atelectasis. HEPATOBILIARY: Normal hepatic contours. No intra- or extrahepatic biliary dilatation. Vicarious excretion contrast within the gallbladder. PANCREAS: Normal pancreas. No ductal dilatation or peripancreatic fluid collection. SPLEEN: Hypodense lesion in the spleen measures 2.4 cm. ADRENALS/URINARY TRACT: The adrenal glands are normal. Moderate left hydronephrosis. The urinary bladder is normal for degree of distention STOMACH/BOWEL: Nasogastric  tube tip is within the decompressed gastric lumen. There is diffuse distension of the colon with large volume stool. The distal rectum is decompressed with moderate wall thickening. The small bowel is not dilated. VASCULAR/LYMPHATIC: There is calcific atherosclerosis of the abdominal aorta. Multiple para-aortic lymph nodes, poorly characterized without intravenous contrast. REPRODUCTIVE: Status post hysterectomy. No adnexal mass. MUSCULOSKELETAL. Right total hip arthroplasty. Multilevel facet arthrosis. No acute lesion. OTHER: None. IMPRESSION: 1. Diffuse distension of the  colon with large volume stool. Wall thickening of the rectum is concerning for a localized mass (either primary or a serosal metastatic implant), potential source of obstruction. 2. Moderate left hydronephrosis, likely due to mass effect on the left ureter by the tremendous stool volume. 3. Para-aortic lymphadenopathy, limited visualization due to lack of intravenous contrast, but similar to 05/17/2019. Previously demonstrated peritoneal metastatic implants are difficult to visualize due to lack of contrast and anatomic distortion from colonic distension. 4. Small pleural effusions with bibasilar atelectasis. 5. Indeterminate hypodense left splenic lesion, possibly metastatic. Aortic Atherosclerosis (ICD10-I70.0). Electronically Signed   By: Ulyses Jarred M.D.   On: 12/09/2020 19:47   DG Abdomen Acute W/Chest  Result Date: 12/09/2020 CLINICAL DATA:  80 year old female with constipation, evaluate for obstruction. EXAM: DG ABDOMEN ACUTE WITH 1 VIEW CHEST COMPARISON:  CT abdomen pelvis from 05/17/2019 FINDINGS: Multiple dilated loops of air-filled bowel. Degree of distension limits evaluation, however most bowel loops appeared to be large bowel, some suggestion of a few gaseous distended loops of small bowel in the mid abdomen. No evidence of pneumoperitoneum. Right IJ port in place with the tip near the cavoatrial junction. Bibasilar subsegmental atelectasis. Stable mild cardiomegaly. No acute osseous abnormality. IMPRESSION: Severe gaseous distension of the colon and possibly the small bowel, suggestive of bowel obstruction. Recommend CT abdomen pelvis for further evaluation. Bibasilar subsegmental atelectasis with stable mild cardiomegaly. Electronically Signed   By: Ruthann Cancer MD   On: 12/09/2020 14:25   DG Addison Bailey G Tube Plc W/Fl W/Rad  Result Date: 12/09/2020 CLINICAL DATA:  Nasogastric tube placement for bowel obstruction EXAM: NASO G TUBE PLACEMENT WITH FL AND WITH RAD CONTRAST:  None FLUOROSCOPY TIME:   Fluoroscopy Time:  24 seconds Radiation Exposure Index (if provided by the fluoroscopic device): 4.9 mGy Number of Acquired Spot Images: 1 COMPARISON:  Same day abdominal radiograph. FINDINGS: Nasogastric tube with tip and side port overlying the stomach. Similar gaseous dilation of the visualized upper abdominal loops of bowel. IMPRESSION: Nasogastric tube with tip and side port overlying the stomach. Persistent bowel obstruction. Electronically Signed   By: Dahlia Bailiff MD   On: 12/09/2020 17:51        Scheduled Meds: . amiodarone  100 mg Oral Daily  . ezetimibe  10 mg Oral QHS  . levothyroxine  88 mcg Oral Q0600   Continuous Infusions:   LOS: 1 day    Time spent: 25 mins.More than 50% of that time was spent in counseling and/or coordination of care.      Shelly Coss, MD [facility] P1/10/2021, 10:50 AM

## 2020-12-10 NOTE — Discharge Summary (Signed)
Physician Discharge Summary  [name] JSE:[id number] DOB: February 13, 1941 DOA: 12/09/2020  PCP: Shon Baton, MD  Admit date: 12/09/2020 Discharge date: 12/10/2020  Admitted From: Home Disposition:  residential hopsice  Discharge Condition:Stable CODE STATUS:DNR Diet recommendation: Dysphagia   Brief/Interim Summary: Patient is a 80 year old female with history of metastatic ovarian/endometrial cancer currently on hospice at home, but recently fever, coronary artery disease, hypertension, hyperlipidemia, hypothyroidism who presents to the emergency department for the evaluation of abdominal distention, constipation.  She reported progressive abdominal distention for last 5 days. She denied any abdominal pain, nausea or vomiting.  Patient and family interested on transfer to residential hospice.  Plan is to transfer her to [facility] place.   Following problems were addressed during hospitalization:  Severe colonic bowel obstruction secondary to metastatic ovarian/endometrial cancer: Currently on hospice care at home.  Previously following with oncology.  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  NG tube was placed in the emergency department which has came out spontaneously.  Will not put another NG tube.  Continue pain management, antiemetics, bowel regimen. Started on full comfort care.  Plan is to transfer her to [facility]  Paroxysmal A. Fib/hypothyroidism/hyperlipidemia:On amiodarone, Zetia, Synthyroid, melatonin at home   Discharge Diagnoses:  Principal Problem:   Bowel obstruction (HCC) Active Problems:   Hypothyroidism   Recurrent carcinoma of ovary (HCC)   AF (paroxysmal atrial fibrillation) ([place])    Discharge Instructions   Allergies as of 12/10/2020      Reactions   Clonidine Derivatives Shortness Of Breath   Coreg [carvedilol] Shortness Of Breath   Augmentin [amoxicillin-pot Clavulanate] Diarrhea   Caffeine Other (See Comments)   Makes heart race   Codeine Other  (See Comments)   Does not like the feeling she gets   Doxycycline    "Explosive diarrhea"   Flexeril [cyclobenzaprine] Other (See Comments)   Pt states "increased heart rate"   Lipitor [atorvastatin] Dermatitis   "feels like bugs are biting her"    Lisinopril    LIP NUMBNESS   Pravastatin Other (See Comments)   FEELS LIKE "BUG BITES" BITING HER LEGS    Tape Rash   TEGADERM.   (use opsite on PAC)   Tegaderm Ag Mesh [silver] Rash, Other (See Comments)   Burns skin   Ultram [tramadol] Palpitations      Medication List    STOP taking these medications   acetaminophen 500 MG tablet Commonly known as: TYLENOL   amiodarone 200 MG tablet Commonly known as: PACERONE   Biotin 10 MG Tabs   [employer] Gas Relief 125 MG Caps Generic drug: Simethicone   [employer] Pain Relief Regular St 325 MG tablet Generic drug: acetaminophen   diphenhydrAMINE 25 MG tablet Commonly known as: BENADRYL   ezetimibe 10 MG tablet Commonly known as: ZETIA   Glucosamine HCl 1000 MG Tabs   ICY HOT EX   ipratropium 0.06 % nasal spray Commonly known as: ATROVENT   Melatonin 5 MG Caps   multivitamin with minerals Tabs tablet   oxyCODONE 5 MG immediate release tablet Commonly known as: Oxy IR/ROXICODONE   potassium chloride 10 MEQ tablet Commonly known as: KLOR-CON   prochlorperazine 10 MG tablet Commonly known as: COMPAZINE   Synthroid 88 MCG tablet Generic drug: levothyroxine   SYSTANE OP       Allergies  Allergen Reactions  . Clonidine Derivatives Shortness Of Breath  . Coreg [Carvedilol] Shortness Of Breath  . Augmentin [Amoxicillin-Pot Clavulanate] Diarrhea  . Caffeine Other (See Comments)  Makes heart race  . Codeine Other (See Comments)    Does not like the feeling she gets  . Doxycycline     "Explosive diarrhea"  . Flexeril [Cyclobenzaprine] Other (See Comments)    Pt states "increased heart rate"  . Lipitor [Atorvastatin] Dermatitis    "feels like bugs are biting her"   .  Lisinopril     LIP NUMBNESS  . Pravastatin Other (See Comments)    FEELS LIKE "BUG BITES" BITING HER LEGS   . Tape Rash    TEGADERM.   (use opsite on PAC)  . Tegaderm Ag Mesh [Silver] Rash and Other (See Comments)    Burns skin  . Ultram [Tramadol] Palpitations    Consultations:  None   Procedures/Studies: CT Abdomen Pelvis Wo Contrast  Result Date: 12/09/2020 CLINICAL DATA:  Abdominal distension EXAM: CT ABDOMEN AND PELVIS WITHOUT CONTRAST TECHNIQUE: Multidetector CT imaging of the abdomen and pelvis was performed following the standard protocol without IV contrast. COMPARISON:  CT abdomen pelvis 05/17/2019 FINDINGS: LOWER CHEST: Small pleural effusions with bibasilar atelectasis. HEPATOBILIARY: Normal hepatic contours. No intra- or extrahepatic biliary dilatation. Vicarious excretion contrast within the gallbladder. PANCREAS: Normal pancreas. No ductal dilatation or peripancreatic fluid collection. SPLEEN: Hypodense lesion in the spleen measures 2.4 cm. ADRENALS/URINARY TRACT: The adrenal glands are normal. Moderate left hydronephrosis. The urinary bladder is normal for degree of distention STOMACH/BOWEL: Nasogastric tube tip is within the decompressed gastric lumen. There is diffuse distension of the colon with large volume stool. The distal rectum is decompressed with moderate wall thickening. The small bowel is not dilated. VASCULAR/LYMPHATIC: There is calcific atherosclerosis of the abdominal aorta. Multiple para-aortic lymph nodes, poorly characterized without intravenous contrast. REPRODUCTIVE: Status post hysterectomy. No adnexal mass. MUSCULOSKELETAL. Right total hip arthroplasty. Multilevel facet arthrosis. No acute lesion. OTHER: None. IMPRESSION: 1. Diffuse distension of the colon with large volume stool. Wall thickening of the rectum is concerning for a localized mass (either primary or a serosal metastatic implant), potential source of obstruction. 2. Moderate left hydronephrosis,  likely due to mass effect on the left ureter by the tremendous stool volume. 3. Para-aortic lymphadenopathy, limited visualization due to lack of intravenous contrast, but similar to 05/17/2019. Previously demonstrated peritoneal metastatic implants are difficult to visualize due to lack of contrast and anatomic distortion from colonic distension. 4. Small pleural effusions with bibasilar atelectasis. 5. Indeterminate hypodense left splenic lesion, possibly metastatic. Aortic Atherosclerosis (ICD10-I70.0). Electronically Signed   By: Ulyses Jarred M.D.   On: 12/09/2020 19:47   DG Abdomen Acute W/Chest  Result Date: 12/09/2020 CLINICAL DATA:  80 year old female with constipation, evaluate for obstruction. EXAM: DG ABDOMEN ACUTE WITH 1 VIEW CHEST COMPARISON:  CT abdomen pelvis from 05/17/2019 FINDINGS: Multiple dilated loops of air-filled bowel. Degree of distension limits evaluation, however most bowel loops appeared to be large bowel, some suggestion of a few gaseous distended loops of small bowel in the mid abdomen. No evidence of pneumoperitoneum. Right IJ port in place with the tip near the cavoatrial junction. Bibasilar subsegmental atelectasis. Stable mild cardiomegaly. No acute osseous abnormality. IMPRESSION: Severe gaseous distension of the colon and possibly the small bowel, suggestive of bowel obstruction. Recommend CT abdomen pelvis for further evaluation. Bibasilar subsegmental atelectasis with stable mild cardiomegaly. Electronically Signed   By: Ruthann Cancer MD   On: 12/09/2020 14:25   DG Addison Bailey G Tube Plc W/Fl W/Rad  Result Date: 12/09/2020 CLINICAL DATA:  Nasogastric tube placement for bowel obstruction EXAM: NASO G TUBE PLACEMENT WITH FL  AND WITH RAD CONTRAST:  None FLUOROSCOPY TIME:  Fluoroscopy Time:  24 seconds Radiation Exposure Index (if provided by the fluoroscopic device): 4.9 mGy Number of Acquired Spot Images: 1 COMPARISON:  Same day abdominal radiograph. FINDINGS: Nasogastric tube  with tip and side port overlying the stomach. Similar gaseous dilation of the visualized upper abdominal loops of bowel. IMPRESSION: Nasogastric tube with tip and side port overlying the stomach. Persistent bowel obstruction. Electronically Signed   By: Dahlia Bailiff MD   On: 12/09/2020 17:51       Subjective: Patient seen and examined at the bedside this morning. Hemodynamically stable for transfer to [facility]  Discharge Exam: Vitals:   12/10/20 0732 12/10/20 0844  BP: (!) 154/112 (!) 163/88  Pulse: 80 79  Resp: 15 15  Temp:  98.9 F (37.2 C)  SpO2: 94% 96%   Vitals:   12/09/20 1900 12/10/20 0320 12/10/20 0732 12/10/20 0844  BP: (!) 169/82 (!) 155/85 (!) 154/112 (!) 163/88  Pulse: 75 88 80 79  Resp: 18 16 15 15   Temp:    98.9 F (37.2 C)  TempSrc:    Oral  SpO2: 97% 93% 94% 96%  Weight:    60.3 kg  Height:        General: Pt is alert, awake, not in acute distress Cardiovascular: RRR, S1/S2 +, no rubs, no gallops Respiratory: CTA bilaterally, no wheezing, no rhonchi Abdominal: distended Extremities: no edema, no cyanosis    The results of significant diagnostics from this hospitalization (including imaging, microbiology, ancillary and laboratory) are listed below for reference.     Microbiology: Recent Results (from the past 240 hour(s))  SARS CORONAVIRUS 2 (TAT 6-24 HRS) Nasopharyngeal Nasopharyngeal Swab     Status: None   Collection Time: 12/09/20 11:00 PM   Specimen: Nasopharyngeal Swab  Result Value Ref Range Status   SARS Coronavirus 2 NEGATIVE NEGATIVE Final    Comment: (NOTE) SARS-CoV-2 target nucleic acids are NOT DETECTED.  The SARS-CoV-2 RNA is generally detectable in upper and lower respiratory specimens during the acute phase of infection. Negative results do not preclude SARS-CoV-2 infection, do not rule out co-infections with other pathogens, and should not be used as the sole basis for treatment or other patient management  decisions. Negative results must be combined with clinical observations, patient history, and epidemiological information. The expected result is Negative.  Fact Sheet for Patients: SugarRoll.be  Fact Sheet for Healthcare Providers: https://www.woods-mathews.com/  This test is not yet approved or cleared by the [place] FDA and  has been authorized for detection and/or diagnosis of SARS-CoV-2 by FDA under an Emergency Use Authorization (EUA). This EUA will remain  in effect (meaning this test can be used) for the duration of the COVID-19 declaration under Se ction 564(b)(1) of the Act, 21 U.S.C. section 360bbb-3(b)(1), unless the authorization is terminated or revoked sooner.  Performed at [facility], [place] [address] [place], [place] [address]      Labs: BNP (last 3 results) No results for input(s): BNP in the last 8760 hours. Basic Metabolic Panel: Recent Labs  Lab 12/09/20 1440  NA 128*  K 4.3  CL 93*  CO2 25  GLUCOSE 109*  BUN 22  CREATININE 1.18*  CALCIUM 8.9   Liver Function Tests: Recent Labs  Lab 12/09/20 1440  AST 25  ALT 15  ALKPHOS 74  BILITOT 0.6  PROT 6.3*  ALBUMIN 3.7   No results for input(s): LIPASE, AMYLASE in the last 168 hours.  No results for input(s): AMMONIA in the last 168 hours. CBC: Recent Labs  Lab 12/09/20 1440  WBC 9.2  NEUTROABS 7.0  HGB 8.1*  HCT 27.1*  MCV 83.9  PLT 298   Cardiac Enzymes: No results for input(s): CKTOTAL, CKMB, CKMBINDEX, TROPONINI in the last 168 hours. BNP: Invalid input(s): POCBNP CBG: No results for input(s): GLUCAP in the last 168 hours. D-Dimer No results for input(s): DDIMER in the last 72 hours. Hgb A1c No results for input(s): HGBA1C in the last 72 hours. Lipid Profile No results for input(s): CHOL, HDL, LDLCALC, TRIG, CHOLHDL, LDLDIRECT in the last 72 hours. Thyroid function studies No results for input(s): TSH, T4TOTAL, T3FREE,  THYROIDAB in the last 72 hours.  Invalid input(s): FREET3 Anemia work up No results for input(s): VITAMINB12, FOLATE, FERRITIN, TIBC, IRON, RETICCTPCT in the last 72 hours. Urinalysis    Component Value Date/Time   COLORURINE STRAW (A) 04/09/2020 1937   APPEARANCEUR CLEAR 04/09/2020 1937   LABSPEC 1.008 04/09/2020 1937   LABSPEC 1.005 05/28/2015 1512   PHURINE 7.0 04/09/2020 1937   GLUCOSEU NEGATIVE 04/09/2020 1937   GLUCOSEU Negative 05/28/2015 1512   HGBUR NEGATIVE 04/09/2020 1937   BILIRUBINUR NEGATIVE 04/09/2020 1937   BILIRUBINUR Negative 05/28/2015 [place] 04/09/2020 1937   PROTEINUR NEGATIVE 04/09/2020 1937   UROBILINOGEN 0.2 05/28/2015 1512   NITRITE NEGATIVE 04/09/2020 1937   LEUKOCYTESUR NEGATIVE 04/09/2020 1937   LEUKOCYTESUR Negative 05/28/2015 1512   Sepsis Labs Invalid input(s): PROCALCITONIN,  WBC,  LACTICIDVEN Microbiology Recent Results (from the past 240 hour(s))  SARS CORONAVIRUS 2 (TAT 6-24 HRS) Nasopharyngeal Nasopharyngeal Swab     Status: None   Collection Time: 12/09/20 11:00 PM   Specimen: Nasopharyngeal Swab  Result Value Ref Range Status   SARS Coronavirus 2 NEGATIVE NEGATIVE Final    Comment: (NOTE) SARS-CoV-2 target nucleic acids are NOT DETECTED.  The SARS-CoV-2 RNA is generally detectable in upper and lower respiratory specimens during the acute phase of infection. Negative results do not preclude SARS-CoV-2 infection, do not rule out co-infections with other pathogens, and should not be used as the sole basis for treatment or other patient management decisions. Negative results must be combined with clinical observations, patient history, and epidemiological information. The expected result is Negative.  Fact Sheet for Patients: SugarRoll.be  Fact Sheet for Healthcare Providers: https://www.woods-mathews.com/  This test is not yet approved or cleared by the [place] FDA  and  has been authorized for detection and/or diagnosis of SARS-CoV-2 by FDA under an Emergency Use Authorization (EUA). This EUA will remain  in effect (meaning this test can be used) for the duration of the COVID-19 declaration under Se ction 564(b)(1) of the Act, 21 U.S.C. section 360bbb-3(b)(1), unless the authorization is terminated or revoked sooner.  Performed at [facility], [place] [address] [place], [place] [address]     Please note: You were cared for by a hospitalist during your hospital stay. Once you are discharged, your primary care physician will handle any further medical issues. Please note that NO REFILLS for any discharge medications will be authorized once you are discharged, as it is imperative that you return to your primary care physician (or establish a relationship with a primary care physician if you do not have one) for your post hospital discharge needs so that they can reassess your need for medications and monitor your lab values.    Time coordinating discharge: 40 minutes  SIGNED:   Shelly Coss, MD  [facility] 12/10/2020, 12:07 PM Pager [phone]  If 7PM-7AM, please contact night-coverage [email or web address] Password [email or web address]

## 2020-12-10 NOTE — Progress Notes (Addendum)
Manufacturing engineer [facility]) Hospital Liaison note.    Addendum:  Pt has been approved for [facility]. Pt has been offered and accepted a bed for transfer today.  Please transport with [facility] as they contract with [place] for this service.  Please call report to [phone].  Please be sure the [facility] DNR form transports with the patient.   Spoke with family to confirm interest and explain services. Eligibility for [facility] pending at this time.  Thank you for the opportunity to participate in this patients care.  Chrislyn Edison Pace, BSN, RN [place] (listed on [place] under Hospice[facility])    [phone]

## 2020-12-30 DEATH — deceased

## 2021-05-29 ENCOUNTER — Encounter: Payer: Self-pay | Admitting: Hematology and Oncology
